# Patient Record
Sex: Female | Born: 1955 | Race: Black or African American | Hispanic: No | Marital: Single | Smoking: Former smoker
Health system: Southern US, Community
[De-identification: ages and names within clinical notes are randomized; demographics above are authoritative.]

## PROBLEM LIST (undated history)

## (undated) DIAGNOSIS — I499 Cardiac arrhythmia, unspecified: Secondary | ICD-10-CM

## (undated) DIAGNOSIS — D219 Benign neoplasm of connective and other soft tissue, unspecified: Secondary | ICD-10-CM

## (undated) DIAGNOSIS — Q8740 Marfan's syndrome, unspecified: Secondary | ICD-10-CM

## (undated) DIAGNOSIS — I509 Heart failure, unspecified: Secondary | ICD-10-CM

## (undated) DIAGNOSIS — M069 Rheumatoid arthritis, unspecified: Secondary | ICD-10-CM

## (undated) DIAGNOSIS — M1A9XX0 Chronic gout, unspecified, without tophus (tophi): Secondary | ICD-10-CM

## (undated) DIAGNOSIS — H548 Legal blindness, as defined in USA: Secondary | ICD-10-CM

## (undated) DIAGNOSIS — L210 Seborrhea capitis: Secondary | ICD-10-CM

## (undated) DIAGNOSIS — K565 Intestinal adhesions [bands], unspecified as to partial versus complete obstruction: Secondary | ICD-10-CM

## (undated) DIAGNOSIS — F1910 Other psychoactive substance abuse, uncomplicated: Secondary | ICD-10-CM

## (undated) DIAGNOSIS — I10 Essential (primary) hypertension: Secondary | ICD-10-CM

## (undated) DIAGNOSIS — Z974 Presence of external hearing-aid: Secondary | ICD-10-CM

## (undated) DIAGNOSIS — I4892 Unspecified atrial flutter: Principal | ICD-10-CM

## (undated) DIAGNOSIS — M810 Age-related osteoporosis without current pathological fracture: Secondary | ICD-10-CM

## (undated) DIAGNOSIS — K5720 Diverticulitis of large intestine with perforation and abscess without bleeding: Secondary | ICD-10-CM

## (undated) DIAGNOSIS — K469 Unspecified abdominal hernia without obstruction or gangrene: Secondary | ICD-10-CM

## (undated) DIAGNOSIS — Z86010 Personal history of colonic polyps: Secondary | ICD-10-CM

## (undated) DIAGNOSIS — Z9889 Other specified postprocedural states: Secondary | ICD-10-CM

## (undated) DIAGNOSIS — Z96659 Presence of unspecified artificial knee joint: Secondary | ICD-10-CM

## (undated) DIAGNOSIS — I639 Cerebral infarction, unspecified: Secondary | ICD-10-CM

## (undated) DIAGNOSIS — K56609 Unspecified intestinal obstruction, unspecified as to partial versus complete obstruction: Secondary | ICD-10-CM

## (undated) DIAGNOSIS — C801 Malignant (primary) neoplasm, unspecified: Secondary | ICD-10-CM

## (undated) HISTORY — DX: Intestinal adhesions (bands), unspecified as to partial versus complete obstruction: K56.50

## (undated) HISTORY — DX: Legal blindness, as defined in USA: H54.8

## (undated) HISTORY — DX: Seborrhea capitis: L21.0

## (undated) HISTORY — DX: Age-related osteoporosis without current pathological fracture: M81.0

## (undated) HISTORY — DX: Presence of external hearing-aid: Z97.4

## (undated) HISTORY — DX: Rheumatoid arthritis, unspecified: M06.9

## (undated) HISTORY — DX: Benign neoplasm of connective and other soft tissue, unspecified: D21.9

## (undated) HISTORY — DX: Diverticulitis of large intestine with perforation and abscess without bleeding: K57.20

## (undated) HISTORY — DX: Essential (primary) hypertension: I10

## (undated) HISTORY — DX: Marfan syndrome, unspecified: Q87.40

## (undated) HISTORY — DX: Other psychoactive substance abuse, uncomplicated: F19.10

## (undated) HISTORY — DX: Cerebral infarction, unspecified: I63.9

## (undated) HISTORY — PX: COLON SURGERY: SHX602

## (undated) HISTORY — DX: Chronic gout, unspecified, without tophus (tophi): M1A.9XX0

## (undated) HISTORY — DX: Unspecified abdominal hernia without obstruction or gangrene: K46.9

## (undated) HISTORY — DX: Unspecified intestinal obstruction, unspecified as to partial versus complete obstruction: K56.609

## (undated) HISTORY — DX: Other specified postprocedural states: Z98.890

## (undated) HISTORY — DX: Personal history of colonic polyps: Z86.010

## (undated) HISTORY — DX: Presence of unspecified artificial knee joint: Z96.659

## (undated) HISTORY — PX: HERNIA REPAIR: SHX51

---
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
## 1998-02-02 ENCOUNTER — Other Ambulatory Visit: Admission: RE | Admit: 1998-02-02 | Discharge: 1998-02-02 | Payer: Self-pay | Admitting: Family Medicine

## 1998-02-09 ENCOUNTER — Ambulatory Visit (HOSPITAL_COMMUNITY): Admission: RE | Admit: 1998-02-09 | Discharge: 1998-02-09 | Payer: Self-pay | Admitting: Family Medicine

## 1999-03-31 ENCOUNTER — Other Ambulatory Visit: Admission: RE | Admit: 1999-03-31 | Discharge: 1999-03-31 | Payer: Self-pay | Admitting: Family Medicine

## 1999-04-17 ENCOUNTER — Ambulatory Visit (HOSPITAL_COMMUNITY): Admission: RE | Admit: 1999-04-17 | Discharge: 1999-04-17 | Payer: Self-pay | Admitting: Family Medicine

## 2000-12-04 ENCOUNTER — Encounter: Payer: Self-pay | Admitting: Urology

## 2000-12-04 ENCOUNTER — Ambulatory Visit (HOSPITAL_COMMUNITY): Admission: RE | Admit: 2000-12-04 | Discharge: 2000-12-04 | Payer: Self-pay | Admitting: Urology

## 2001-04-25 ENCOUNTER — Other Ambulatory Visit: Admission: RE | Admit: 2001-04-25 | Discharge: 2001-04-25 | Payer: Self-pay | Admitting: Family Medicine

## 2002-06-23 ENCOUNTER — Encounter: Admission: RE | Admit: 2002-06-23 | Discharge: 2002-06-23 | Payer: Self-pay | Admitting: Family Medicine

## 2002-06-23 ENCOUNTER — Encounter: Payer: Self-pay | Admitting: Family Medicine

## 2002-08-14 ENCOUNTER — Emergency Department (HOSPITAL_COMMUNITY): Admission: EM | Admit: 2002-08-14 | Discharge: 2002-08-14 | Payer: Self-pay | Admitting: Emergency Medicine

## 2002-08-19 ENCOUNTER — Emergency Department (HOSPITAL_COMMUNITY): Admission: EM | Admit: 2002-08-19 | Discharge: 2002-08-19 | Payer: Self-pay | Admitting: Emergency Medicine

## 2002-08-19 ENCOUNTER — Encounter: Payer: Self-pay | Admitting: Emergency Medicine

## 2002-09-15 ENCOUNTER — Encounter: Admission: RE | Admit: 2002-09-15 | Discharge: 2002-12-14 | Payer: Self-pay | Admitting: Orthopedic Surgery

## 2002-10-09 ENCOUNTER — Emergency Department (HOSPITAL_COMMUNITY): Admission: EM | Admit: 2002-10-09 | Discharge: 2002-10-09 | Payer: Self-pay | Admitting: Emergency Medicine

## 2002-10-09 ENCOUNTER — Encounter: Payer: Self-pay | Admitting: Emergency Medicine

## 2002-10-11 ENCOUNTER — Emergency Department (HOSPITAL_COMMUNITY): Admission: EM | Admit: 2002-10-11 | Discharge: 2002-10-11 | Payer: Self-pay

## 2002-10-14 ENCOUNTER — Inpatient Hospital Stay (HOSPITAL_COMMUNITY): Admission: EM | Admit: 2002-10-14 | Discharge: 2002-10-15 | Payer: Self-pay | Admitting: Emergency Medicine

## 2002-10-15 ENCOUNTER — Encounter: Payer: Self-pay | Admitting: Internal Medicine

## 2002-10-27 ENCOUNTER — Other Ambulatory Visit: Admission: RE | Admit: 2002-10-27 | Discharge: 2002-10-27 | Payer: Self-pay | Admitting: Obstetrics and Gynecology

## 2002-11-11 ENCOUNTER — Ambulatory Visit (HOSPITAL_COMMUNITY): Admission: RE | Admit: 2002-11-11 | Discharge: 2002-11-11 | Payer: Self-pay | Admitting: Obstetrics and Gynecology

## 2002-11-11 ENCOUNTER — Encounter: Payer: Self-pay | Admitting: Obstetrics and Gynecology

## 2003-01-19 ENCOUNTER — Encounter: Payer: Self-pay | Admitting: Internal Medicine

## 2003-01-22 ENCOUNTER — Emergency Department (HOSPITAL_COMMUNITY): Admission: EM | Admit: 2003-01-22 | Discharge: 2003-01-22 | Payer: Self-pay | Admitting: *Deleted

## 2003-01-22 ENCOUNTER — Encounter: Payer: Self-pay | Admitting: *Deleted

## 2003-04-16 ENCOUNTER — Encounter: Payer: Self-pay | Admitting: Internal Medicine

## 2003-04-26 ENCOUNTER — Encounter: Admission: RE | Admit: 2003-04-26 | Discharge: 2003-04-26 | Payer: Self-pay | Admitting: Internal Medicine

## 2003-04-26 ENCOUNTER — Encounter: Payer: Self-pay | Admitting: Internal Medicine

## 2004-04-18 ENCOUNTER — Other Ambulatory Visit: Admission: RE | Admit: 2004-04-18 | Discharge: 2004-04-18 | Payer: Self-pay | Admitting: *Deleted

## 2004-09-14 ENCOUNTER — Ambulatory Visit: Payer: Self-pay | Admitting: Family Medicine

## 2004-10-24 ENCOUNTER — Ambulatory Visit: Payer: Self-pay | Admitting: Family Medicine

## 2004-12-25 ENCOUNTER — Ambulatory Visit: Payer: Self-pay | Admitting: Family Medicine

## 2004-12-28 ENCOUNTER — Emergency Department (HOSPITAL_COMMUNITY): Admission: EM | Admit: 2004-12-28 | Discharge: 2004-12-28 | Payer: Self-pay | Admitting: Emergency Medicine

## 2005-01-01 ENCOUNTER — Ambulatory Visit: Payer: Self-pay | Admitting: Internal Medicine

## 2005-01-03 ENCOUNTER — Ambulatory Visit: Payer: Self-pay | Admitting: Family Medicine

## 2005-01-06 ENCOUNTER — Emergency Department (HOSPITAL_COMMUNITY): Admission: EM | Admit: 2005-01-06 | Discharge: 2005-01-06 | Payer: Self-pay | Admitting: Emergency Medicine

## 2005-01-24 ENCOUNTER — Emergency Department (HOSPITAL_COMMUNITY): Admission: EM | Admit: 2005-01-24 | Discharge: 2005-01-25 | Payer: Self-pay | Admitting: Emergency Medicine

## 2005-02-06 ENCOUNTER — Ambulatory Visit: Payer: Self-pay | Admitting: Internal Medicine

## 2005-03-12 ENCOUNTER — Emergency Department (HOSPITAL_COMMUNITY): Admission: EM | Admit: 2005-03-12 | Discharge: 2005-03-12 | Payer: Self-pay | Admitting: Emergency Medicine

## 2005-03-16 ENCOUNTER — Ambulatory Visit: Payer: Self-pay | Admitting: Internal Medicine

## 2005-05-15 ENCOUNTER — Ambulatory Visit: Payer: Self-pay | Admitting: Family Medicine

## 2005-10-30 ENCOUNTER — Ambulatory Visit: Payer: Self-pay | Admitting: Family Medicine

## 2005-11-07 ENCOUNTER — Ambulatory Visit: Payer: Self-pay | Admitting: Family Medicine

## 2005-11-07 ENCOUNTER — Emergency Department (HOSPITAL_COMMUNITY): Admission: RE | Admit: 2005-11-07 | Discharge: 2005-11-08 | Payer: Self-pay | Admitting: Family Medicine

## 2005-11-12 ENCOUNTER — Ambulatory Visit: Payer: Self-pay | Admitting: Family Medicine

## 2005-12-05 ENCOUNTER — Ambulatory Visit: Payer: Self-pay | Admitting: Internal Medicine

## 2005-12-30 ENCOUNTER — Emergency Department (HOSPITAL_COMMUNITY): Admission: EM | Admit: 2005-12-30 | Discharge: 2005-12-30 | Payer: Self-pay | Admitting: Emergency Medicine

## 2006-03-19 ENCOUNTER — Ambulatory Visit: Payer: Self-pay | Admitting: Family Medicine

## 2006-06-25 DIAGNOSIS — M1A9XX0 Chronic gout, unspecified, without tophus (tophi): Secondary | ICD-10-CM

## 2006-06-25 HISTORY — DX: Chronic gout, unspecified, without tophus (tophi): M1A.9XX0

## 2006-07-15 ENCOUNTER — Emergency Department (HOSPITAL_COMMUNITY): Admission: EM | Admit: 2006-07-15 | Discharge: 2006-07-15 | Payer: Self-pay | Admitting: Emergency Medicine

## 2006-07-17 ENCOUNTER — Ambulatory Visit: Payer: Self-pay | Admitting: Family Medicine

## 2006-07-26 DIAGNOSIS — Z8673 Personal history of transient ischemic attack (TIA), and cerebral infarction without residual deficits: Secondary | ICD-10-CM | POA: Insufficient documentation

## 2006-07-26 DIAGNOSIS — I639 Cerebral infarction, unspecified: Secondary | ICD-10-CM

## 2006-07-26 HISTORY — DX: Cerebral infarction, unspecified: I63.9

## 2006-07-27 ENCOUNTER — Encounter: Payer: Self-pay | Admitting: Emergency Medicine

## 2006-07-28 ENCOUNTER — Ambulatory Visit: Payer: Self-pay | Admitting: Neurology

## 2006-07-28 ENCOUNTER — Ambulatory Visit: Payer: Self-pay | Admitting: Internal Medicine

## 2006-07-28 ENCOUNTER — Inpatient Hospital Stay (HOSPITAL_COMMUNITY): Admission: AD | Admit: 2006-07-28 | Discharge: 2006-07-31 | Payer: Self-pay | Admitting: Internal Medicine

## 2006-07-29 ENCOUNTER — Encounter (INDEPENDENT_AMBULATORY_CARE_PROVIDER_SITE_OTHER): Payer: Self-pay | Admitting: Neurology

## 2006-07-29 ENCOUNTER — Encounter: Payer: Self-pay | Admitting: Cardiology

## 2006-08-06 ENCOUNTER — Ambulatory Visit: Payer: Self-pay | Admitting: Family Medicine

## 2006-08-08 ENCOUNTER — Ambulatory Visit: Payer: Self-pay | Admitting: Internal Medicine

## 2006-08-19 ENCOUNTER — Ambulatory Visit: Payer: Self-pay | Admitting: Internal Medicine

## 2006-08-20 ENCOUNTER — Ambulatory Visit: Payer: Self-pay | Admitting: Family Medicine

## 2006-08-29 ENCOUNTER — Emergency Department (HOSPITAL_COMMUNITY): Admission: EM | Admit: 2006-08-29 | Discharge: 2006-08-29 | Payer: Self-pay | Admitting: Emergency Medicine

## 2006-09-02 ENCOUNTER — Ambulatory Visit: Payer: Self-pay | Admitting: Family Medicine

## 2006-09-05 ENCOUNTER — Ambulatory Visit: Payer: Self-pay | Admitting: Internal Medicine

## 2006-09-24 DIAGNOSIS — K469 Unspecified abdominal hernia without obstruction or gangrene: Secondary | ICD-10-CM

## 2006-09-24 HISTORY — DX: Unspecified abdominal hernia without obstruction or gangrene: K46.9

## 2006-10-17 ENCOUNTER — Encounter (INDEPENDENT_AMBULATORY_CARE_PROVIDER_SITE_OTHER): Payer: Self-pay | Admitting: Specialist

## 2006-10-17 ENCOUNTER — Ambulatory Visit (HOSPITAL_COMMUNITY): Admission: RE | Admit: 2006-10-17 | Discharge: 2006-10-18 | Payer: Self-pay | Admitting: Surgery

## 2006-11-24 DIAGNOSIS — Z96659 Presence of unspecified artificial knee joint: Secondary | ICD-10-CM

## 2006-11-24 HISTORY — PX: TOTAL KNEE ARTHROPLASTY: SHX125

## 2006-11-24 HISTORY — DX: Presence of unspecified artificial knee joint: Z96.659

## 2006-11-25 ENCOUNTER — Ambulatory Visit: Payer: Self-pay | Admitting: Family Medicine

## 2006-12-04 ENCOUNTER — Inpatient Hospital Stay (HOSPITAL_COMMUNITY): Admission: RE | Admit: 2006-12-04 | Discharge: 2006-12-10 | Payer: Self-pay | Admitting: Orthopedic Surgery

## 2007-01-07 DIAGNOSIS — I10 Essential (primary) hypertension: Secondary | ICD-10-CM | POA: Insufficient documentation

## 2007-01-07 DIAGNOSIS — M1990 Unspecified osteoarthritis, unspecified site: Secondary | ICD-10-CM | POA: Insufficient documentation

## 2007-01-07 DIAGNOSIS — K219 Gastro-esophageal reflux disease without esophagitis: Secondary | ICD-10-CM | POA: Insufficient documentation

## 2007-01-07 DIAGNOSIS — E785 Hyperlipidemia, unspecified: Secondary | ICD-10-CM | POA: Insufficient documentation

## 2007-01-07 DIAGNOSIS — Q8740 Marfan's syndrome, unspecified: Secondary | ICD-10-CM | POA: Insufficient documentation

## 2007-01-14 ENCOUNTER — Ambulatory Visit: Payer: Self-pay | Admitting: Family Medicine

## 2007-01-14 ENCOUNTER — Telehealth: Payer: Self-pay | Admitting: *Deleted

## 2007-01-14 DIAGNOSIS — M109 Gout, unspecified: Secondary | ICD-10-CM | POA: Insufficient documentation

## 2007-03-31 ENCOUNTER — Inpatient Hospital Stay (HOSPITAL_COMMUNITY): Admission: RE | Admit: 2007-03-31 | Discharge: 2007-04-07 | Payer: Self-pay | Admitting: Orthopedic Surgery

## 2007-04-03 ENCOUNTER — Encounter: Payer: Self-pay | Admitting: *Deleted

## 2007-04-07 ENCOUNTER — Ambulatory Visit: Payer: Self-pay | Admitting: *Deleted

## 2007-04-12 DIAGNOSIS — H547 Unspecified visual loss: Secondary | ICD-10-CM | POA: Insufficient documentation

## 2007-04-12 DIAGNOSIS — K648 Other hemorrhoids: Secondary | ICD-10-CM | POA: Insufficient documentation

## 2007-04-12 DIAGNOSIS — F1011 Alcohol abuse, in remission: Secondary | ICD-10-CM | POA: Insufficient documentation

## 2007-04-12 DIAGNOSIS — H548 Legal blindness, as defined in USA: Secondary | ICD-10-CM | POA: Insufficient documentation

## 2007-05-07 ENCOUNTER — Ambulatory Visit: Payer: Self-pay | Admitting: Family Medicine

## 2007-05-07 DIAGNOSIS — M810 Age-related osteoporosis without current pathological fracture: Secondary | ICD-10-CM | POA: Insufficient documentation

## 2007-05-08 ENCOUNTER — Telehealth: Payer: Self-pay | Admitting: Family Medicine

## 2007-05-09 LAB — CONVERTED CEMR LAB
Basophils Absolute: 0 10*3/uL (ref 0.0–0.1)
Basophils Relative: 0.3 % (ref 0.0–1.0)
Eosinophils Absolute: 0 10*3/uL (ref 0.0–0.6)
Eosinophils Relative: 0.2 % (ref 0.0–5.0)
HCT: 38.8 % (ref 36.0–46.0)
Hemoglobin: 13.3 g/dL (ref 12.0–15.0)
Lymphocytes Relative: 15.8 % (ref 12.0–46.0)
MCHC: 34.3 g/dL (ref 30.0–36.0)
MCV: 89.5 fL (ref 78.0–100.0)
Monocytes Absolute: 0.4 10*3/uL (ref 0.2–0.7)
Monocytes Relative: 4.2 % (ref 3.0–11.0)
Neutro Abs: 8.6 10*3/uL — ABNORMAL HIGH (ref 1.4–7.7)
Neutrophils Relative %: 79.5 % — ABNORMAL HIGH (ref 43.0–77.0)
Platelets: 225 10*3/uL (ref 150–400)
RBC: 4.34 M/uL (ref 3.87–5.11)
RDW: 15 % — ABNORMAL HIGH (ref 11.5–14.6)
Vit D, 1,25-Dihydroxy: 19 — ABNORMAL LOW (ref 30–89)
WBC: 10.7 10*3/uL — ABNORMAL HIGH (ref 4.5–10.5)

## 2007-05-19 ENCOUNTER — Encounter: Payer: Self-pay | Admitting: Family Medicine

## 2007-05-19 ENCOUNTER — Ambulatory Visit: Payer: Self-pay | Admitting: Internal Medicine

## 2007-06-09 ENCOUNTER — Telehealth: Payer: Self-pay | Admitting: Family Medicine

## 2007-06-17 ENCOUNTER — Encounter: Payer: Self-pay | Admitting: Family Medicine

## 2007-06-17 ENCOUNTER — Emergency Department (HOSPITAL_COMMUNITY): Admission: EM | Admit: 2007-06-17 | Discharge: 2007-06-18 | Payer: Self-pay | Admitting: Emergency Medicine

## 2007-07-01 ENCOUNTER — Ambulatory Visit: Payer: Self-pay | Admitting: Family Medicine

## 2007-07-01 DIAGNOSIS — E041 Nontoxic single thyroid nodule: Secondary | ICD-10-CM | POA: Insufficient documentation

## 2007-07-01 DIAGNOSIS — J984 Other disorders of lung: Secondary | ICD-10-CM | POA: Insufficient documentation

## 2007-07-07 ENCOUNTER — Encounter: Admission: RE | Admit: 2007-07-07 | Discharge: 2007-07-07 | Payer: Self-pay | Admitting: Family Medicine

## 2007-08-06 ENCOUNTER — Encounter: Payer: Self-pay | Admitting: Family Medicine

## 2007-11-10 ENCOUNTER — Ambulatory Visit: Payer: Self-pay | Admitting: Family Medicine

## 2007-12-08 ENCOUNTER — Other Ambulatory Visit: Admission: RE | Admit: 2007-12-08 | Discharge: 2007-12-08 | Payer: Self-pay | Admitting: Obstetrics & Gynecology

## 2007-12-17 ENCOUNTER — Telehealth: Payer: Self-pay | Admitting: Internal Medicine

## 2007-12-18 ENCOUNTER — Telehealth: Payer: Self-pay | Admitting: Family Medicine

## 2007-12-30 ENCOUNTER — Telehealth: Payer: Self-pay | Admitting: Family Medicine

## 2007-12-31 ENCOUNTER — Telehealth: Payer: Self-pay | Admitting: Family Medicine

## 2008-01-01 ENCOUNTER — Telehealth: Payer: Self-pay | Admitting: Family Medicine

## 2008-01-08 ENCOUNTER — Ambulatory Visit: Payer: Self-pay | Admitting: Internal Medicine

## 2008-02-17 ENCOUNTER — Telehealth: Payer: Self-pay | Admitting: Family Medicine

## 2008-03-17 ENCOUNTER — Telehealth: Payer: Self-pay | Admitting: Family Medicine

## 2008-03-17 ENCOUNTER — Emergency Department (HOSPITAL_COMMUNITY): Admission: EM | Admit: 2008-03-17 | Discharge: 2008-03-17 | Payer: Self-pay | Admitting: Emergency Medicine

## 2008-03-22 ENCOUNTER — Ambulatory Visit: Payer: Self-pay | Admitting: Family Medicine

## 2008-03-22 DIAGNOSIS — T7840XA Allergy, unspecified, initial encounter: Secondary | ICD-10-CM | POA: Insufficient documentation

## 2008-03-25 ENCOUNTER — Telehealth: Payer: Self-pay | Admitting: Family Medicine

## 2008-04-01 ENCOUNTER — Encounter: Payer: Self-pay | Admitting: Family Medicine

## 2008-04-02 ENCOUNTER — Telehealth: Payer: Self-pay | Admitting: Internal Medicine

## 2008-04-02 ENCOUNTER — Telehealth: Payer: Self-pay | Admitting: Family Medicine

## 2008-05-14 ENCOUNTER — Telehealth: Payer: Self-pay | Admitting: Family Medicine

## 2008-06-03 ENCOUNTER — Telehealth: Payer: Self-pay | Admitting: Family Medicine

## 2008-06-08 ENCOUNTER — Telehealth: Payer: Self-pay | Admitting: Family Medicine

## 2008-06-21 ENCOUNTER — Encounter: Payer: Self-pay | Admitting: Family Medicine

## 2008-06-25 DIAGNOSIS — Z9889 Other specified postprocedural states: Secondary | ICD-10-CM

## 2008-06-25 HISTORY — DX: Other specified postprocedural states: Z98.890

## 2008-06-25 HISTORY — PX: BUNIONECTOMY: SHX129

## 2008-07-01 ENCOUNTER — Telehealth: Payer: Self-pay | Admitting: Family Medicine

## 2008-07-06 ENCOUNTER — Telehealth: Payer: Self-pay | Admitting: Family Medicine

## 2008-07-08 ENCOUNTER — Encounter: Payer: Self-pay | Admitting: Family Medicine

## 2008-07-14 ENCOUNTER — Ambulatory Visit: Payer: Self-pay | Admitting: Surgery

## 2008-07-22 ENCOUNTER — Ambulatory Visit: Payer: Self-pay | Admitting: Family Medicine

## 2008-07-22 DIAGNOSIS — L03119 Cellulitis of unspecified part of limb: Secondary | ICD-10-CM

## 2008-07-22 DIAGNOSIS — L02419 Cutaneous abscess of limb, unspecified: Secondary | ICD-10-CM | POA: Insufficient documentation

## 2008-08-05 ENCOUNTER — Ambulatory Visit: Payer: Self-pay | Admitting: Internal Medicine

## 2008-08-05 ENCOUNTER — Encounter: Payer: Self-pay | Admitting: Family Medicine

## 2008-08-18 ENCOUNTER — Encounter: Payer: Self-pay | Admitting: Family Medicine

## 2008-08-23 ENCOUNTER — Encounter: Payer: Self-pay | Admitting: Family Medicine

## 2008-08-24 ENCOUNTER — Ambulatory Visit: Payer: Self-pay | Admitting: Family Medicine

## 2008-08-26 LAB — CONVERTED CEMR LAB
BUN: 13 mg/dL (ref 6–23)
Basophils Absolute: 0 10*3/uL (ref 0.0–0.1)
Basophils Relative: 0.1 % (ref 0.0–3.0)
CO2: 30 meq/L (ref 19–32)
Calcium: 9.5 mg/dL (ref 8.4–10.5)
Chloride: 100 meq/L (ref 96–112)
Creatinine, Ser: 0.7 mg/dL (ref 0.4–1.2)
Eosinophils Absolute: 0 10*3/uL (ref 0.0–0.7)
Eosinophils Relative: 0.2 % (ref 0.0–5.0)
GFR calc Af Amer: 113 mL/min
GFR calc non Af Amer: 93 mL/min
Glucose, Bld: 78 mg/dL (ref 70–99)
HCT: 41.5 % (ref 36.0–46.0)
Hemoglobin: 14.3 g/dL (ref 12.0–15.0)
Lymphocytes Relative: 11.5 % — ABNORMAL LOW (ref 12.0–46.0)
MCHC: 34.4 g/dL (ref 30.0–36.0)
MCV: 91.7 fL (ref 78.0–100.0)
Monocytes Absolute: 0.4 10*3/uL (ref 0.1–1.0)
Monocytes Relative: 2.6 % — ABNORMAL LOW (ref 3.0–12.0)
Neutro Abs: 11.7 10*3/uL — ABNORMAL HIGH (ref 1.4–7.7)
Neutrophils Relative %: 85.6 % — ABNORMAL HIGH (ref 43.0–77.0)
Platelets: 242 10*3/uL (ref 150–400)
Potassium: 4.1 meq/L (ref 3.5–5.1)
RBC: 4.52 M/uL (ref 3.87–5.11)
RDW: 13.9 % (ref 11.5–14.6)
Sodium: 140 meq/L (ref 135–145)
WBC: 13.7 10*3/uL — ABNORMAL HIGH (ref 4.5–10.5)

## 2008-09-03 ENCOUNTER — Telehealth: Payer: Self-pay | Admitting: *Deleted

## 2008-09-09 ENCOUNTER — Encounter: Admission: RE | Admit: 2008-09-09 | Discharge: 2008-09-09 | Payer: Self-pay | Admitting: Family Medicine

## 2008-09-13 ENCOUNTER — Ambulatory Visit: Payer: Self-pay | Admitting: Family Medicine

## 2008-09-15 ENCOUNTER — Telehealth: Payer: Self-pay | Admitting: Family Medicine

## 2008-12-14 ENCOUNTER — Telehealth: Payer: Self-pay | Admitting: Family Medicine

## 2009-01-10 ENCOUNTER — Ambulatory Visit: Payer: Self-pay | Admitting: Family Medicine

## 2009-01-10 DIAGNOSIS — H60399 Other infective otitis externa, unspecified ear: Secondary | ICD-10-CM | POA: Insufficient documentation

## 2009-04-26 ENCOUNTER — Telehealth: Payer: Self-pay | Admitting: Internal Medicine

## 2009-04-28 ENCOUNTER — Telehealth: Payer: Self-pay | Admitting: Family Medicine

## 2009-05-30 ENCOUNTER — Telehealth: Payer: Self-pay | Admitting: Family Medicine

## 2009-05-30 ENCOUNTER — Ambulatory Visit: Payer: Self-pay | Admitting: Internal Medicine

## 2009-06-07 ENCOUNTER — Ambulatory Visit: Payer: Self-pay | Admitting: Family Medicine

## 2009-06-07 DIAGNOSIS — M069 Rheumatoid arthritis, unspecified: Secondary | ICD-10-CM | POA: Insufficient documentation

## 2009-06-08 ENCOUNTER — Telehealth: Payer: Self-pay | Admitting: Family Medicine

## 2009-06-30 ENCOUNTER — Telehealth: Payer: Self-pay | Admitting: Family Medicine

## 2009-07-11 ENCOUNTER — Encounter: Payer: Self-pay | Admitting: Family Medicine

## 2009-07-11 LAB — HM MAMMOGRAPHY: HM Mammogram: NEGATIVE

## 2009-07-18 ENCOUNTER — Telehealth: Payer: Self-pay | Admitting: Family Medicine

## 2009-09-12 ENCOUNTER — Encounter: Payer: Self-pay | Admitting: Family Medicine

## 2009-09-26 ENCOUNTER — Ambulatory Visit: Payer: Self-pay | Admitting: Family Medicine

## 2009-09-26 DIAGNOSIS — R609 Edema, unspecified: Secondary | ICD-10-CM | POA: Insufficient documentation

## 2009-09-28 LAB — CONVERTED CEMR LAB
ALT: 33 units/L (ref 0–35)
AST: 37 units/L (ref 0–37)
Albumin: 3.3 g/dL — ABNORMAL LOW (ref 3.5–5.2)
Alkaline Phosphatase: 80 units/L (ref 39–117)
BUN: 6 mg/dL (ref 6–23)
Basophils Absolute: 0 10*3/uL (ref 0.0–0.1)
Basophils Relative: 0.3 % (ref 0.0–3.0)
Bilirubin, Direct: 0.2 mg/dL (ref 0.0–0.3)
CO2: 27 meq/L (ref 19–32)
Calcium: 9.2 mg/dL (ref 8.4–10.5)
Chloride: 102 meq/L (ref 96–112)
Creatinine, Ser: 0.9 mg/dL (ref 0.4–1.2)
Eosinophils Absolute: 0.1 10*3/uL (ref 0.0–0.7)
Eosinophils Relative: 0.9 % (ref 0.0–5.0)
GFR calc non Af Amer: 84.06 mL/min (ref >60)
Glucose, Bld: 86 mg/dL (ref 70–99)
HCT: 37.2 % (ref 36.0–46.0)
Hemoglobin: 12.6 g/dL (ref 12.0–15.0)
Hgb A1c MFr Bld: 5.6 % (ref 4.6–6.5)
Lymphocytes Relative: 22.4 % (ref 12.0–46.0)
Lymphs Abs: 2.1 10*3/uL (ref 0.7–4.0)
MCHC: 33.9 g/dL (ref 30.0–36.0)
MCV: 90.3 fL (ref 78.0–100.0)
Monocytes Absolute: 0.5 10*3/uL (ref 0.1–1.0)
Monocytes Relative: 5 % (ref 3.0–12.0)
Neutro Abs: 6.7 10*3/uL (ref 1.4–7.7)
Neutrophils Relative %: 71.4 % (ref 43.0–77.0)
Platelets: 262 10*3/uL (ref 150.0–400.0)
Potassium: 4 meq/L (ref 3.5–5.1)
RBC: 4.12 M/uL (ref 3.87–5.11)
RDW: 15.5 % — ABNORMAL HIGH (ref 11.5–14.6)
Sodium: 140 meq/L (ref 135–145)
TSH: 1.15 microintl units/mL (ref 0.35–5.50)
Total Bilirubin: 0.6 mg/dL (ref 0.3–1.2)
Total Protein: 7.1 g/dL (ref 6.0–8.3)
WBC: 9.4 10*3/uL (ref 4.5–10.5)

## 2009-10-12 ENCOUNTER — Telehealth (INDEPENDENT_AMBULATORY_CARE_PROVIDER_SITE_OTHER): Payer: Self-pay | Admitting: *Deleted

## 2009-10-31 ENCOUNTER — Emergency Department (HOSPITAL_COMMUNITY): Admission: EM | Admit: 2009-10-31 | Discharge: 2009-10-31 | Payer: Self-pay | Admitting: Emergency Medicine

## 2009-11-03 ENCOUNTER — Emergency Department (HOSPITAL_COMMUNITY): Admission: EM | Admit: 2009-11-03 | Discharge: 2009-11-03 | Payer: Self-pay | Admitting: Emergency Medicine

## 2009-11-07 ENCOUNTER — Telehealth: Payer: Self-pay | Admitting: Family Medicine

## 2009-11-15 ENCOUNTER — Ambulatory Visit: Payer: Self-pay | Admitting: Family Medicine

## 2009-11-15 DIAGNOSIS — T148XXA Other injury of unspecified body region, initial encounter: Secondary | ICD-10-CM | POA: Insufficient documentation

## 2009-12-02 ENCOUNTER — Telehealth: Payer: Self-pay | Admitting: *Deleted

## 2009-12-05 ENCOUNTER — Telehealth: Payer: Self-pay | Admitting: Family Medicine

## 2010-02-01 ENCOUNTER — Telehealth: Payer: Self-pay | Admitting: Family Medicine

## 2010-04-03 ENCOUNTER — Ambulatory Visit: Payer: Self-pay | Admitting: Family Medicine

## 2010-04-17 ENCOUNTER — Telehealth: Payer: Self-pay | Admitting: Family Medicine

## 2010-05-05 ENCOUNTER — Telehealth: Payer: Self-pay | Admitting: Family Medicine

## 2010-06-02 ENCOUNTER — Telehealth: Payer: Self-pay | Admitting: Family Medicine

## 2010-06-02 ENCOUNTER — Telehealth: Payer: Self-pay | Admitting: *Deleted

## 2010-06-12 ENCOUNTER — Telehealth: Payer: Self-pay | Admitting: Family Medicine

## 2010-07-04 ENCOUNTER — Telehealth: Payer: Self-pay | Admitting: Family Medicine

## 2010-07-12 ENCOUNTER — Encounter: Payer: Self-pay | Admitting: Family Medicine

## 2010-07-16 ENCOUNTER — Encounter: Payer: Self-pay | Admitting: Family Medicine

## 2010-07-17 ENCOUNTER — Encounter: Payer: Self-pay | Admitting: Family Medicine

## 2010-07-25 NOTE — Assessment & Plan Note (Signed)
Summary: follow up on wound/cjr   Vital Signs:  Patient profile:   55 year old female Weight:      159 pounds BMI:     23.57 Temp:     99.0 degrees F oral BP sitting:   160 / 100  (left arm) Cuff size:   regular  Vitals Entered By: Raechel Ache, RN (Nov 15, 2009 2:15 PM) CC: Follow-up R leg wound; was in ER 2 weeks ago.   History of Present Illness: Here to recheck a wound on her right lower leg. She discovered this on 10-31-09 and went to the ER. It was lanced open and drained, then packed. On 11-03-09 she went back to the ER to have the packing removed. She is dressing it daily with Neosporin and gauze compresses. It is a little sore but otherwise does not bother her.   Allergies: No Known Drug Allergies  Past History:  Past Medical History: GOUT HYPERTENSION  HYPERLIPIDEMIA MARFAN'S SYNDROME  GERD  Blindness Internal Hemorrhoids Hx of Alcholism( in remission) Hepatic cyst  Diverticulosis Chronic UTI, sees Dr. Patsi Sears IBS-C Allergic reactions/? angioedema Rheumatoid arthritis  Past Surgical History: Reviewed history from 07/22/2008 and no changes required. Cervical spine sx-1986 per Jeral Fruit Let eye prosthesis Total knee replacement bilateral Foot Surgery, bilateral- multiple operations, sees Dr. Raynald Kemp at [facility] Neck Surgery, c-spine fracture Hernia Surgery right femoral 2008  Review of Systems  The patient denies anorexia, fever, weight loss, weight gain, vision loss, decreased hearing, hoarseness, chest pain, syncope, dyspnea on exertion, peripheral edema, prolonged cough, headaches, hemoptysis, abdominal pain, melena, hematochezia, severe indigestion/heartburn, hematuria, incontinence, genital sores, muscle weakness, suspicious skin lesions, transient blindness, difficulty walking, depression, unusual weight change, abnormal bleeding, enlarged lymph nodes, angioedema, breast masses, and testicular masses.    Physical Exam  General:   Well-developed,well-nourished,in no acute distress; alert,appropriate and cooperative throughout examination Extremities:  the right lower leg has a small tender nodular area with a clean incision over it. is draining some serosanguinous fluid. No purulence seen. Not warm or red.    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 Take 1 capsule by mouth twice a day 2)  Nexium 40 Mg Cpdr (Esomeprazole magnesium) .Marland Kitchen.. 1 once daily 3)  Allegra 180 Mg Tabs (Fexofenadine hcl) .... Once daily 4)  Colchicine 0.6 Mg Tabs (Colchicine) .Marland Kitchen.. 1 by mouth two times a day 5)  Stool Softner  .... As needed 6)  Diprolene Af 0.05 % Crea (Aug betamethasone dipropionate) .... As needed 7)  Tramadol Hcl 50 Mg Tabs (Tramadol hcl) .Marland Kitchen.. 1-2 by mouth every 6 hours as needed for pain 8)  Prednisone 10 Mg Tabs (Prednisone) .... 2 by mouth once daily 9)  Skelaxin 800 Mg Tabs (Metaxalone) .... Two times a day 10)  Vitamin D 91478 Unit Caps (Ergocalciferol) .Marland Kitchen.. 1 by mouth once daily 11)  Amlodipine Besylate 10 Mg Tabs (Amlodipine besylate) .... Once daily 12)  Vicodin 5-500 Mg Tabs (Hydrocodone-acetaminophen) .Marland Kitchen.. 1 every 6 hours as needed pain 13)  Epipen 2-pak 0.3 Mg/0.21ml (1:1000) Devi (Epinephrine hcl (anaphylaxis)) .... As needed 14)  Actonel 150 Mg Tabs (Risedronate sodium) .Marland Kitchen.. 1 tablet by mouth every month 15)  Allopurinol 100 Mg Tabs (Allopurinol) .Marland Kitchen.. 1 by mouth once daily 16)  Cephalexin 250 Mg Tabs (Cephalexin) .... Once daily  Patient Instructions: 1)  With gentle massage I was able to express some clotted blood from  this wound. Redressed with a Bandaid. She will continue to dress it daily.  2)  Please schedule a follow-up appointment as needed .

## 2010-07-25 NOTE — Progress Notes (Signed)
Summary: refill   Phone Note Call from Patient   Summary of Call: pt says she needs refill on ultram.  ins will not cover clarinex.needs substitute Initial call taken by: Sindy Guadeloupe RN,  July 01, 2008 12:09 PM  Follow-up for Phone Call        Recieved a fax from pharmacy  saying that they will cover flonase, nasonex or fexofindidne. Pt uses [facility] Follow-up by: Romualdo Bolk, CMA,  July 02, 2008 9:59 AM  Additional Follow-up for Phone Call Additional follow up Details #1::        call in tramadol 50 mg, take 1 or 2 every 6 hours as needed pain, #60 with 5 rf, also Fexofenidine 180 mg once daily , #30 with 11 rf Additional Follow-up by: Nelwyn Salisbury MD,  July 02, 2008 10:19 AM    Additional Follow-up for Phone Call Additional follow up Details #2::    Rx faxed to pharmacy. Pt aware of this. Follow-up by: Romualdo Bolk, CMA,  July 02, 2008 11:53 AM  New/Updated Medications: ULTRAM ER 100 MG  TB24 (TRAMADOL HCL) 1-2  every 6 hours as needed FEXOFENADINE HCL 180 MG TABS (FEXOFENADINE HCL) 1 by mouth once daily   Prescriptions: FEXOFENADINE HCL 180 MG TABS (FEXOFENADINE HCL) 1 by mouth once daily  #30 x 11   Entered by:   Romualdo Bolk, CMA   Authorized by:   Nelwyn Salisbury MD   Signed by:   Romualdo Bolk, CMA on 07/02/2008   Method used:   Faxed to ...       [facility] (retail)       [address]       [place], [place]  [address]       Ph: [phone]       Fax: [phone]   RxID:   [id number] ULTRAM ER 100 MG  TB24 (TRAMADOL HCL) 1-2  every 6 hours as needed  #60 x 5   Entered by:   Romualdo Bolk, CMA   Authorized by:   Nelwyn Salisbury MD   Signed by:   Romualdo Bolk, CMA on 07/02/2008   Method used:   Faxed to ...       [facility] (retail)       [address]       [place], [place]  [address]       Ph:  [phone]       Fax: [phone]   RxID:   [id number]

## 2010-07-25 NOTE — Progress Notes (Signed)
Summary: med for gout  Phone Note Call from Patient Call back at Home Phone [phone]   Caller: pt lvm triage Call For: fry  Summary of Call: she called back with meds that medicaid would pay for for her gout.  They pharmacy says Dr Clent Ridges has not called it in.  Is she supposed to get the med or not  Initial call taken by: Roselle Locus,  June 08, 2008 1:17 PM  Follow-up for Phone Call        call in Allopurinol 100 mg once daily for one year Follow-up by: Nelwyn Salisbury MD,  June 08, 2008 3:38 PM  Additional Follow-up for Phone Call Additional follow up Details #1::        Phone Call Completed, Rx Called In Additional Follow-up by: Alfred Levins, CMA,  June 08, 2008 5:04 PM    New/Updated Medications: ALLOPURINOL 100 MG TABS (ALLOPURINOL) 1 by mouth once daily   Prescriptions: ALLOPURINOL 100 MG TABS (ALLOPURINOL) 1 by mouth once daily  #30 x 11   Entered by:   Alfred Levins, CMA   Authorized by:   Nelwyn Salisbury MD   Signed by:   Alfred Levins, CMA on 06/08/2008   Method used:   Faxed to ...       [facility] (retail)       [address]       [place], [place]  [address]       Ph: [phone]       Fax: [phone]   RxID:   [id number]

## 2010-07-25 NOTE — Assessment & Plan Note (Signed)
Summary: FU ER ALLERGIC REACTION/PS   Vital Signs:  Patient Profile:   55 Years Old Female Height:     69 inches Weight:      160 pounds Temp:     98.5 degrees F oral BP sitting:   142 / 94  (left arm) Cuff size:   regular  Vitals Entered By: Alfred Levins, CMA (March 22, 2008 2:23 PM)                 PCP:  Shellia Carwin MD  Chief Complaint:  f/u from er and dizzy.  History of Present Illness: Has had several trips to the ER over the past few years for allergic reactions. She presumes it is to food, but we aren't sure. The most  recent was on 03-17-08 when she had SOB, lip and tongue swelling, and itching. She did not respond to oral Benadryl at home, so EMS was called. She responded to IV Benadryl and Solumedrol at the ER. Has felt fine since.     Current Allergies: No known allergies   Past Medical History:    Reviewed history from 01/08/2008 and no changes required:       GOUT       HYPERTENSION        HYPERLIPIDEMIA       MARFAN'S SYNDROME        OSTEOARTHRITIS        GERD        Blindness       Internal Hemorrhoids       Hx of Alcholism( in remission)       Hepatic cyst        Diverticulosis       Chronic UTI       IBS-C     Review of Systems  The patient denies anorexia, fever, weight loss, weight gain, vision loss, decreased hearing, hoarseness, chest pain, syncope, dyspnea on exertion, peripheral edema, prolonged cough, headaches, hemoptysis, abdominal pain, melena, hematochezia, severe indigestion/heartburn, hematuria, incontinence, genital sores, muscle weakness, suspicious skin lesions, transient blindness, difficulty walking, depression, unusual weight change, abnormal bleeding, enlarged lymph nodes, angioedema, breast masses, and testicular masses.     Physical Exam  General:     Well-developed,well-nourished,in no acute distress; alert,appropriate and cooperative throughout examination Mouth:     Oral mucosa and oropharynx without lesions or  exudates.  Teeth in good repair. Neck:     No deformities, masses, or tenderness noted. Lungs:     Normal respiratory effort, chest expands symmetrically. Lungs are clear to auscultation, no crackles or wheezes. Heart:     Normal rate and regular rhythm. S1 and S2 normal without gallop, murmur, click, rub or other extra sounds.    Impression & Recommendations:  Problem # 1:  ALLERGIC REACTION (ICD-995.3)  Orders: Allergy Referral  (Allergy)   Complete Medication List: 1)  Amitiza 24 Mcg Caps (Lubiprostone) .... Take 1 capsule by mouth twice a day 2)  Nexium 40 Mg Cpdr (Esomeprazole magnesium) .Marland Kitchen.. 1 once daily 3)  Clarinex 5 Mg Tabs (Desloratadine) .Marland Kitchen.. 1 once daily 4)  Cephalexin 250 Mg Caps (Cephalexin) .Marland Kitchen.. 1 once daily 5)  Aspir-low 81 Mg Tbec (Aspirin) .Marland Kitchen.. 1 once daily 6)  Colchicine 0.6 Mg Tabs (Colchicine) .Marland Kitchen.. 1 by mouth two times a day 7)  Stool Softner  .... As needed 8)  Diprolene Af 0.05 % Crea (Aug betamethasone dipropionate) .... As needed 9)  Ultram Er 100 Mg Tb24 (Tramadol hcl) .Marland Kitchen.. 1 every 6 hours  as needed 10)  Prednisone 20 Mg Tabs (Prednisone) .... One a day 11)  Skelaxin 800 Mg Tabs (Metaxalone) .... Two times a day 12)  Hycodan 5-1.5 Mg/40ml Syrp (Hydrocodone-homatropine) .Marland Kitchen.. 1 tsp every 6 hours as needed cough 13)  Vitamin D 96295 Unit Caps (Ergocalciferol) .Marland Kitchen.. 1 by mouth once daily 14)  Amlodipine Besylate 10 Mg Tabs (Amlodipine besylate) .... Once daily 15)  Vicodin 5-500 Mg Tabs (Hydrocodone-acetaminophen) .Marland Kitchen.. 1 every 6 hours as needed pain 16)  Epipen 2-pak 0.3 Mg/0.50ml (1:1000) Devi (Epinephrine hcl (anaphylaxis)) .... As needed   Patient Instructions: 1)  Given an EpiPen to use as needed . Refer to Allergy for testing.   Prescriptions: EPIPEN 2-PAK 0.3 MG/0.3ML (1:1000) DEVI (EPINEPHRINE HCL (ANAPHYLAXIS)) as needed  #1 x 5   Entered and Authorized by:   Nelwyn Salisbury MD   Signed by:   Nelwyn Salisbury MD on 03/22/2008   Method used:   Print  then Give to Patient   RxID:   [id number]  ]

## 2010-07-25 NOTE — Progress Notes (Signed)
Summary: Norvasc 10 mg.  Phone Note Call from Patient   Caller: Patient Call For: Dr. Clent Ridges Summary of Call: Pt needs Norvasc 10 mg. called to [facility] [phone] Pt. [phone] Initial call taken by: Lynann Beaver CMA,  December 30, 2007 4:08 PM  Follow-up for Phone Call        Phone Call Completed, Rx Called In Follow-up by: Alfred Levins, CMA,  December 30, 2007 5:19 PM      Prescriptions: NORVASC 5 MG  TABS (AMLODIPINE BESYLATE) once daily  #30 x 11   Entered by:   Alfred Levins, CMA   Authorized by:   Nelwyn Salisbury MD   Signed by:   Alfred Levins, CMA on 12/30/2007   Method used:   Faxed to ...       [facility] / [facility]       [address]       [place], [place]  [address]       Ph: [phone]       Fax: [phone]   RxID:   [id number]

## 2010-07-25 NOTE — Progress Notes (Signed)
Summary: Wrong medication called in  Phone Note Call from Patient Call back at [phone]   Caller: [name] live Call For: Clent Ridges Summary of Call: Patient need rx refill for tramadol hcl1-2-every 6 hours as needed. Initial call taken by: Alfred Levins, CMA,  July 06, 2008 9:49 AM  Follow-up for Phone Call        we already did this on 07-01-08 Follow-up by: Nelwyn Salisbury MD,  July 06, 2008 11:08 AM  Additional Follow-up for Phone Call Additional follow up Details #1::        Left message on [name] home phone to check with her pharmacy, if she has questions, call office back. Additional Follow-up by: Sid Falcon LPN,  July 06, 2008 11:39 AM    Additional Follow-up for Phone Call Additional follow up Details #2::    Patient states that the rx that was faxed was for extended release and thats not what she is taking.  Please call in the correct medication. Follow-up by: Barnie Mort,  July 06, 2008 11:59 AM  Additional Follow-up for Phone Call Additional follow up Details #3:: Details for Additional Follow-up Action Taken: change this to Tramadol 50 mg . 1 or 2 every 6 hours as needed pain, #100 with 5 rf  rx called in, [name] aware  Alfred Levins, CMA  July 06, 2008 12:23 PM Additional Follow-up by: Nelwyn Salisbury MD,  July 06, 2008 12:19 PM

## 2010-07-25 NOTE — Progress Notes (Signed)
Summary: hydrocodone refill  Phone Note From Pharmacy   [facility]er[facility] Details for Reason: refill request Summary of Call: Pharm is requesting a refill of hydrocone 7.5/750 is this okay to fill? Initial call taken by: Kern Reap CMA,  September 15, 2008 2:22 PM  Follow-up for Phone Call        call in #120 with 5 rf Follow-up by: Nelwyn Salisbury MD,  September 15, 2008 5:15 PM  Additional Follow-up for Phone Call Additional follow up Details #1::        Pharmacist called Additional Follow-up by: Kern Reap CMA,  September 15, 2008 5:20 PM

## 2010-07-25 NOTE — Letter (Signed)
Summary: [facility]  [facility]   Imported By: Maryln Gottron 09/01/2008 14:02:02  _____________________________________________________________________  External Attachment:    Type:   Image     Comment:   External Document

## 2010-07-25 NOTE — Assessment & Plan Note (Signed)
Summary: EAR DRAINING AGAIN/PS   Vital Signs:  Patient profile:   55 year old female Weight:      156 pounds Temp:     98.3 degrees F Pulse rate:   97 / minute BP sitting:   120 / 80  (left arm)  Vitals Entered By: Pura Spice, RN (April 03, 2010 3:22 PM) CC: left ear draining wants flu shot    History of Present Illness: Here for a week of discomfort and draining from the left ear. She used Cortisporin drops for this last winter andit worked well then. However she has been using thses for several days with no improvement. No othe symptoms.   Preventive Screening-Counseling & Management  Alcohol-Tobacco     Smoking Status: current     Packs/Day: 1.0  Allergies (verified): No Known Drug Allergies  Past History:  Past Medical History: Reviewed history from 11/15/2009 and no changes required. GOUT HYPERTENSION  HYPERLIPIDEMIA MARFAN'S SYNDROME  GERD  Blindness Internal Hemorrhoids Hx of Alcholism( in remission) Hepatic cyst  Diverticulosis Chronic UTI, sees Dr. Patsi Sears IBS-C Allergic reactions/? angioedema Rheumatoid arthritis  Past History:  Care Management: Gynecology: Dr Darcel Bayley  Social History: Packs/Day:  1.0  Review of Systems  The patient denies anorexia, fever, weight loss, weight gain, vision loss, decreased hearing, hoarseness, chest pain, syncope, dyspnea on exertion, peripheral edema, prolonged cough, headaches, hemoptysis, abdominal pain, melena, hematochezia, severe indigestion/heartburn, hematuria, incontinence, genital sores, muscle weakness, suspicious skin lesions, transient blindness, difficulty walking, depression, unusual weight change, abnormal bleeding, enlarged lymph nodes, angioedema, breast masses, and testicular masses.         Flu Vaccine Consent Questions     Do you have a history of severe allergic reactions to this vaccine? no    Any prior history of allergic reactions to egg and/or gelatin? no    Do you have a  sensitivity to the preservative Thimersol? no    Do you have a past history of Guillan-Barre Syndrome? no    Do you currently have an acute febrile illness? no    Have you ever had a severe reaction to latex? no    Vaccine information given and explained to patient? yes    Are you currently pregnant? no    Lot Number:[id number]   Exp Date:12/23/2010   Site Given  Left Deltoid IM Pura Spice, RN  April 03, 2010 3:29 PM   Physical Exam  Head:  Normocephalic and atraumatic without obvious abnormalities. No apparent alopecia or balding. Eyes:  No corneal or conjunctival inflammation noted. EOMI. Perrla. Funduscopic exam benign, without hemorrhages, exudates or papilledema. Vision grossly normal. Ears:  left ear canal is red, TM is clear. Right ear is clear.  Nose:  External nasal examination shows no deformity or inflammation. Nasal mucosa are pink and moist without lesions or exudates. Mouth:  Oral mucosa and oropharynx without lesions or exudates.  Teeth in good repair. Neck:  No deformities, masses, or tenderness noted.   Impression & Recommendations:  Problem # 1:  OTITIS EXTERNA (ICD-380.10)  Her updated medication list for this problem includes:    Ciprodex 0.3-0.1 % Susp (Ciprofloxacin-dexamethasone) .Marland Kitchen... Apply 5 drops in ear 4 times a day as needed  Complete Medication List: 1)  Amitiza 24 Mcg Caps (Lubiprostone) .... Take 1 capsule by mouth twice a day 2)  Nexium 40 Mg Cpdr (Esomeprazole magnesium) .Marland Kitchen.. 1 once daily 3)  Colchicine 0.6 Mg Tabs (Colchicine) .Marland Kitchen.. 1 by mouth two times a day 4)  Stool Softner  .... As needed 5)  Diprolene Af 0.05 % Crea (Aug betamethasone dipropionate) .... As needed 6)  Tramadol Hcl 50 Mg Tabs (Tramadol hcl) .Marland Kitchen.. 1-2 by mouth every 6 hours as needed for pain 7)  Prednisone 10 Mg Tabs (Prednisone) .... 2 by mouth once daily 8)  Skelaxin 800 Mg Tabs (Metaxalone) .... Two times a day 9)  Vitamin D 09811 Unit Caps (Ergocalciferol) .Marland Kitchen.. 1 by  mouth once daily 10)  Amlodipine Besylate 10 Mg Tabs (Amlodipine besylate) .... Once daily 11)  Vicodin 5-500 Mg Tabs (Hydrocodone-acetaminophen) .Marland Kitchen.. 1 every 6 hours as needed pain 12)  Epipen 2-pak 0.3 Mg/0.16ml (1:1000) Devi (Epinephrine hcl (anaphylaxis)) .... As needed 13)  Actonel 150 Mg Tabs (Risedronate sodium) .Marland Kitchen.. 1 tablet by mouth every month 14)  Allopurinol 100 Mg Tabs (Allopurinol) .Marland Kitchen.. 1 by mouth once daily 15)  Cephalexin 250 Mg Tabs (Cephalexin) .... Once daily 16)  Zyrtec Allergy 10 Mg Caps (Cetirizine hcl) .Marland Kitchen.. 1 once daily 17)  Ciprodex 0.3-0.1 % Susp (Ciprofloxacin-dexamethasone) .... Apply 5 drops in ear 4 times a day as needed  Other Orders: Admin 1st Vaccine (91478) Flu Vaccine 73yrs + (29562) Pneumococcal Vaccine (13086) Admin of Any Addtl Vaccine (57846)  Patient Instructions: 1)  try Ciprodex drops.  2)  Please schedule a follow-up appointment as needed .  Prescriptions: CIPRODEX 0.3-0.1 % SUSP (CIPROFLOXACIN-DEXAMETHASONE) apply 5 drops in ear 4 times a day as needed  #10 x 2   Entered and Authorized by:   Nelwyn Salisbury MD   Signed by:   Nelwyn Salisbury MD on 04/03/2010   Method used:   Print then Give to Patient   RxID:   [id number]    Immunizations Administered:  Pneumonia Vaccine:    Vaccine Type: Pneumovax    Site: right deltoid    Mfr: Merck    Dose: 0.5 ml    Route: IM    Given by: Pura Spice, RN    Exp. Date: 09/07/2011    Lot #: [id number]    VIS given: 05/30/09 version given April 03, 2010.

## 2010-07-25 NOTE — Procedures (Signed)
Summary: Colonoscopy   Colonoscopy  Procedure date:  01/19/2003  Findings:      Location:  [facility].    Procedures Next Due Date:    Colonoscopy: 12/2007  Patient Name: [name], [name] MRN:  Procedure Procedures: Colonoscopy CPT: [address].  Personnel: Endoscopist: Iva Boop, MD, [facility].  Referred By: Gershon Crane, MD.  Exam Location: Exam performed in Outpatient Clinic. Outpatient  Patient Consent: Procedure, Alternatives, Risks and Benefits discussed, consent obtained, from patient. Consent was obtained by the RN.  Indications Symptoms: Constipation Weight Loss. Abdominal pain / bloating. Change in bowel habits.  History  Pre-Exam Physical: Performed Jan 19, 2003. Cardio-pulmonary exam, Rectal exam, HEENT exam , Abdominal exam, Mental status exam WNL.  Exam Exam: Extent of exam reached: Cecum, extent intended: Cecum.  The cecum was identified by appendiceal orifice and IC valve. Patient position: left side to back. Colon retroflexion performed. Images taken. ASA Classification: III. Tolerance: good.  Monitoring: Pulse and BP monitoring, Oximetry used. Supplemental O2 given.  Colon Prep Used Golytely for colon prep. Prep results: good.  Sedation Meds: Patient assessed and found to be appropriate for moderate (conscious) sedation. Sedation was managed by the Endoscopist. Fentanyl 75 mcg. given IV. Versed 8 mg. given IV.  Findings - DIVERTICULOSIS: Ascending Colon to Sigmoid Colon. Not bleeding. ICD9: Diverticulosis, Colon: 562.10. Comments: SEVERE ON LEFT.  NORMAL EXAM: Cecum.  MULTIPLE POLYPS: Sigmoid Colon. minimum size 3 mm, maximum size 4 mm. Procedure:  biopsy without cautery, removed, Polyp retrieved, 2 polyps Polyps sent to pathology. ICD9: Neoplasia, Benign, Large Bowel: 211.3. Comments: 2 POLYPS.  OTHER FINDING: APHTHOUS ULCERS found in Sigmoid Colon. Biopsy/Other Finding taken. Comments: MULTIPLE DISCRETE ULCERS.  HEMORRHOIDS:  Internal. Size: Grade II. Not bleeding. Not thrombosed. ICD9: Hemorrhoids, Internal and  External: 455.6.   Assessment Abnormal examination, see findings above.  Diagnoses: 562.10: Diverticulosis, Colon.  211.3: Neoplasia, Benign, Large Bowel.  455.6: Hemorrhoids, Internal and  External.   Comments: 2 TINY POLYPS SEVERE DIVERTICULOSIS HEMORRHOIDS ULCERS, UNCERTAIN SIGNIFICANCE Events  Unplanned Interventions: No intervention was required.  Plans Patient Education: Patient given standard instructions for: Diverticulosis. Hemorrhoids.  Comments: TRY MIRALAX Disposition: After procedure patient sent to recovery. After recovery patient sent home.  Scheduling/Referral: Clinic Visit, to Iva Boop, MD, [place], [address],    CC:   Gershon Crane, MD  Appended Document: Colonoscopy pathology was hyperplastic polyps and benign mucosa on biopsies of ulcers

## 2010-07-25 NOTE — Progress Notes (Signed)
Summary: increase dose  Phone Note Call from Patient Call back at Home Phone [phone]   Caller: Patient Call For: fry Summary of Call: pt wants to get back on the prednisone 20mg .  You wanted her to try the 10mg  but she is still having a lot of pain [facility] Initial call taken by: Alfred Levins, CMA,  July 18, 2009 8:17 AM  Follow-up for Phone Call        call in Prednisone 10 mg to take 2 tabs once daily , #60 with 11 rf Follow-up by: Nelwyn Salisbury MD,  July 18, 2009 1:26 PM  Additional Follow-up for Phone Call Additional follow up Details #1::        Phone Call Completed, Rx Called In Additional Follow-up by: Alfred Levins, CMA,  July 18, 2009 1:48 PM    New/Updated Medications: PREDNISONE 10 MG  TABS (PREDNISONE) 2 by mouth once daily Prescriptions: PREDNISONE 10 MG  TABS (PREDNISONE) 2 by mouth once daily  #60 x 11   Entered by:   Alfred Levins, CMA   Authorized by:   Nelwyn Salisbury MD   Signed by:   Alfred Levins, CMA on 07/18/2009   Method used:   Faxed to ...       [facility] (retail)       [address]       [place], [place]  [address]       Ph: [phone]       Fax: [phone]   RxID:   [id number]

## 2010-07-25 NOTE — Progress Notes (Signed)
Summary: Vicodin  prednisone refill as well   Phone Note Call from Patient   Caller: Patient Summary of Call: [facility] [address] [phone] Vicodin refill. Initial call taken by: Lynann Beaver CMA,  May 14, 2008 12:08 PM  Follow-up for Phone Call        pt calling again about vicodin refill and prednisone refill as well. [facility] [address]  [phone] Roselle Locus  May 14, 2008 4:34 PM  Additional Follow-up for Phone Call Additional follow up Details #1::        call in Vicodin #120 with 5 rf and prednisone #30 with 11 rf Additional Follow-up by: Nelwyn Salisbury MD,  May 14, 2008 4:57 PM    Additional Follow-up for Phone Call Additional follow up Details #2::    rx called in, pt aware Follow-up by: Alfred Levins, CMA,  May 14, 2008 5:01 PM    Prescriptions: VICODIN 5-500 MG  TABS (HYDROCODONE-ACETAMINOPHEN) 1 every 6 hours as needed pain  #120 x 5   Entered by:   Alfred Levins, CMA   Authorized by:   Nelwyn Salisbury MD   Signed by:   Alfred Levins, CMA on 05/14/2008   Method used:   Telephoned to ...       [facility] (retail)       [address]       [place], [place]  [address]       Ph: [phone]       Fax: [phone]   RxID:   [id number] PREDNISONE 20 MG  TABS (PREDNISONE) one a day  #30 x 11   Entered by:   Alfred Levins, CMA   Authorized by:   Nelwyn Salisbury MD   Signed by:   Alfred Levins, CMA on 05/14/2008   Method used:   Telephoned to ...       [facility] (retail)       [address]       [place], [place]  [address]       Ph: [phone]       Fax: [phone]   RxID:   [id number]

## 2010-07-25 NOTE — Op Note (Signed)
Summary: Foot Surgery left & right/[facility]  Foot Surgery left & right/[facility]   Imported By: Maryln Gottron 08/10/2008 13:48:18  _____________________________________________________________________  External Attachment:    Type:   Image     Comment:   External Document

## 2010-07-25 NOTE — Miscellaneous (Signed)
Summary: Bone Density  Clinical Lists Changes  Orders: Added new Test order of T-Bone Densitometry (77080) - Signed Added new Test order of T-Lumbar Vertebral Assessment (77082) - Signed 

## 2010-07-25 NOTE — Progress Notes (Signed)
Summary: ER  Phone Note Call from Patient Call back at [phone]   Caller: live Summary of Call: [facility] ER last night 1am for allergic reaction to something.  Whelped up all over body & lips twisted like a stroke.  Doctor there thought too long to be medications, because on them so long.  Rec allergy testing.  Has happened before, but worse last night.  Benadryl & IV steroid & already on prednisone 20mg  day.   Initial call taken by: Rudy Jew, RN,  March 17, 2008 11:48 AM  Follow-up for Phone Call        noted Follow-up by: Nelwyn Salisbury MD,  March 17, 2008 11:52 AM

## 2010-07-25 NOTE — Progress Notes (Signed)
Summary: does she need to continue all her meds   Phone Note Call from Patient Call back at Home Phone [phone]   Caller: patient triage message Call For: fry Summary of Call: Does she need to continue all her meds particularly the Vitamin D since she is on Osteoporsis medicine.  How severe is the osteoporasis  Initial call taken by: Roselle Locus,  June 09, 2007 2:07 PM  Follow-up for Phone Call        it is moderately severe, and yes continue meds as prescribed Follow-up by: Nelwyn Salisbury MD,  June 10, 2007 8:16 AM  Additional Follow-up for Phone Call Additional follow up Details #1::        Phone Call Completed Additional Follow-up by: Alfred Levins, CMA,  June 10, 2007 1:31 PM

## 2010-07-25 NOTE — Assessment & Plan Note (Signed)
Summary: FU FROM REHAB/NJR OK PER DOC/NJR//res per pt/jnl  Medications Added AMITIZA 24 MCG CAPS (LUBIPROSTONE) Take 1 capsule by mouth twice a day NEXIUM 40 MG CPDR (ESOMEPRAZOLE MAGNESIUM) 1 once daily CLARINEX 5 MG  TABS (DESLORATADINE) 1 once daily CEPHALEXIN 250 MG  CAPS (CEPHALEXIN) 1 once daily ASPIR-LOW 81 MG TBEC (ASPIRIN) 1 once daily COLCHICINE 0.6 MG TABS (COLCHICINE) 1 by mouth two times a day * STOOL SOFTNER as needed DARVOCET-N 100 100-650 MG TABS (PROPOXYPHENE N-APAP) as needed DIPROLENE AF 0.05 %  CREA (AUG BETAMETHASONE DIPROPIONATE) as needed ULTRAM ER 100 MG  TB24 (TRAMADOL HCL) 1 every 6 hours as needed PREDNISONE 20 MG  TABS (PREDNISONE) one a day      Allergies Added: NKDA  Vital Signs:  Patient Profile:   55 Years Old Female Temp:     98.7 degrees F (37.06 degrees C) oral Pulse rate:   80 / minute Pulse rhythm:   regular BP sitting:   100 / 72  (right arm)  Vitals Entered By: Alfred Levins, CMA (January 14, 2007 2:41 PM)              Patient in wheelchair did not get weight  Chief Complaint:  Hosp f/u.  History of Present Illness: Was in [facility] from 12-04-06 to 12-10-06 per Dr. Turner Daniels for total right knee replacement. Did well. Is through with PT and is now getting OT at home.  Is having more gout pain despite Colchicine, Darvocet, and Tramadol.  Has been off Coumadin for several weeks, also off Mobic since surgery. BP is stable off meds.  Current Allergies: No known allergies   Past Surgical History:    Cervical spine sx-1986    Let eye prosthesis    Total knee replacement right     Review of Systems      See HPI   Physical Exam  General:     Well-developed,well-nourished,in no acute distress; alert,appropriate and cooperative throughout examination. In wheelchair as usual.    Impression & Recommendations:  Problem # 1:  GOUTY ARTHROPATHY (ICD-274.0)  Her updated medication list for this problem includes:    Colchicine 0.6 Mg Tabs  (Colchicine) .Marland Kitchen... 1 by mouth two times a day   Medications Added to Medication List This Visit: 1)  Amitiza 24 Mcg Caps (Lubiprostone) .... Take 1 capsule by mouth twice a day 2)  Nexium 40 Mg Cpdr (Esomeprazole magnesium) .Marland Kitchen.. 1 once daily 3)  Clarinex 5 Mg Tabs (Desloratadine) .Marland Kitchen.. 1 once daily 4)  Cephalexin 250 Mg Caps (Cephalexin) .Marland Kitchen.. 1 once daily 5)  Aspir-low 81 Mg Tbec (Aspirin) .Marland Kitchen.. 1 once daily 6)  Colchicine 0.6 Mg Tabs (Colchicine) .Marland Kitchen.. 1 by mouth two times a day 7)  Stool Softner  .... As needed 8)  Darvocet-n 100 100-650 Mg Tabs (Propoxyphene n-apap) .... As needed 9)  Diprolene Af 0.05 % Crea (Aug betamethasone dipropionate) .... As needed 10)  Ultram Er 100 Mg Tb24 (Tramadol hcl) .Marland Kitchen.. 1 every 6 hours as needed 11)  Prednisone 20 Mg Tabs (Prednisone) .... One a day   Patient Instructions: 1)  stay off Mobic for now 2)  Please schedule a follow-up appointment as needed.    Prescriptions: PREDNISONE 20 MG  TABS (PREDNISONE) one a day  #30 x 5   Entered and Authorized by:   Nelwyn Salisbury MD   Signed by:   Nelwyn Salisbury MD on 01/14/2007   Method used:   Print then Give to Patient  RxID:   [id number]       Orders Added: 1)  Est. Patient Level III [25956]

## 2010-07-25 NOTE — Progress Notes (Signed)
Summary: Opthamalogist referral  Phone Note Call from Patient   Caller: Patient Call For: Nelwyn Salisbury MD Summary of Call: Pt needs a eye MD referral to evaluate her prosthesis .........keeps falling out. [phone] Initial call taken by: Lynann Beaver CMA,  April 28, 2009 2:23 PM  Follow-up for Phone Call        please refer her to Ophthalmology for this Follow-up by: Nelwyn Salisbury MD,  April 29, 2009 9:17 AM  Additional Follow-up for Phone Call Additional follow up Details #1::        Phone Call Completed Additional Follow-up by: Alfred Levins, CMA,  April 29, 2009 10:47 AM

## 2010-07-25 NOTE — Assessment & Plan Note (Signed)
Summary: 2 month fup/njr    Vital Signs:  Patient Profile:   55 Years Old Female Weight:      135 pounds Temp:     98.2 degrees F oral Pulse rate:   98 / minute BP sitting:   128 / 92  (left arm) Cuff size:   regular  Vitals Entered By: Alfred Levins, CMA (July 01, 2007 4:14 PM)                 Chief Complaint:  hosp f/u.  History of Present Illness: Was in ER on 06-17-07 for chest pain. Chest CT was negative for PE but showed a 0.5 cm nodule in the right middle lobe of lungs, also saw a 1.6 cm nodule in the right lobe of the thyroid. Pain was felt to be esophageal. Told to continue her same meds. Has had no more pains since then.  Current Allergies: No known allergies      Review of Systems      See HPI   Physical Exam  General:     Well-developed,well-nourished,in no acute distress; alert,appropriate and cooperative throughout examination Neck:     No deformities, masses, or tenderness noted.    Impression & Recommendations:  Problem # 1:  GERD (ICD-530.81)  Her updated medication list for this problem includes:    Nexium 40 Mg Cpdr (Esomeprazole magnesium) .Marland Kitchen... 1 once daily   Problem # 2:  LUNG NODULE (ICD-518.89)  Problem # 3:  THYROID NODULE (ICD-241.0)  Orders: Radiology Referral (Radiology)   Complete Medication List: 1)  Amitiza 24 Mcg Caps (Lubiprostone) .... Take 1 capsule by mouth twice a day 2)  Nexium 40 Mg Cpdr (Esomeprazole magnesium) .Marland Kitchen.. 1 once daily 3)  Clarinex 5 Mg Tabs (Desloratadine) .Marland Kitchen.. 1 once daily 4)  Cephalexin 250 Mg Caps (Cephalexin) .Marland Kitchen.. 1 once daily 5)  Aspir-low 81 Mg Tbec (Aspirin) .Marland Kitchen.. 1 once daily 6)  Colchicine 0.6 Mg Tabs (Colchicine) .Marland Kitchen.. 1 by mouth two times a day 7)  Stool Softner  .... As needed 8)  Darvocet-n 100 100-650 Mg Tabs (Propoxyphene n-apap) .... As needed 9)  Diprolene Af 0.05 % Crea (Aug betamethasone dipropionate) .... As needed 10)  Ultram Er 100 Mg Tb24 (Tramadol hcl) .Marland Kitchen.. 1 every 6  hours as needed 11)  Prednisone 20 Mg Tabs (Prednisone) .... One a day 12)  Skelaxin 800 Mg Tabs (Metaxalone) .... Two times a day 13)  Hycodan 5-1.5 Mg/93ml Syrp (Hydrocodone-homatropine) .Marland Kitchen.. 1 tsp every 6 hours as needed cough 14)  Vitamin D [address] Unit Caps (Ergocalciferol) .Marland Kitchen.. 1 by mouth once daily   Patient Instructions: 1)  set up thyroid US now, and repeat chest CT in 6 months 2)  Please schedule a follow-up appointment as needed.    ]

## 2010-07-25 NOTE — Assessment & Plan Note (Signed)
Summary: FOLLOW UP IBS/SP    History of Present Illness Visit Type: Follow-up Visit Primary GI MD: [name] MD [facility] Primary Provider: Shellia Carwin MD Chief Complaint: F/U  IBS History of Present Illness:   This is a 55 year old African American with known IBS and constipation. She reports that the hematochezia is working well and she would like a refill. She has gained quite a bit of weight since I have last seen her and she is on chronic prednisone at 20 mg daily. She is asking me if she should remain on this dose. It has helped her gout tremendously.   GI Review of Systems      Denies abdominal pain, acid reflux, belching, bloating, chest pain, dysphagia with liquids, dysphagia with solids, heartburn, loss of appetite, nausea, vomiting, vomiting blood, weight loss, and  weight gain.      Reports irritable bowel syndrome.     Denies anal fissure, black tarry stools, change in bowel habit, constipation, diarrhea, diverticulosis, fecal incontinence, heme positive stool, hemorrhoids, jaundice, light color stool, liver problems, rectal bleeding, and  rectal pain.    Current Medications (verified): 1)  Amitiza 24 Mcg Caps (Lubiprostone) .... Take 1 Capsule By Mouth Twice A Day 2)  Nexium 40 Mg Cpdr (Esomeprazole Magnesium) .Marland Kitchen.. 1 Once Daily 3)  Allegra 180 Mg Tabs (Fexofenadine Hcl) .... Once Daily 4)  Colchicine 0.6 Mg Tabs (Colchicine) .Marland Kitchen.. 1 By Mouth Two Times A Day 5)  Stool Softner .... As Needed 6)  Diprolene Af 0.05 %  Crea (Aug Betamethasone Dipropionate) .... As Needed 7)  Ultram Er 100 Mg  Tb24 (Tramadol Hcl) .Marland Kitchen.. 1-2  Every 6 Hours As Needed 8)  Prednisone 20 Mg  Tabs (Prednisone) .... One A Day 9)  Skelaxin 800 Mg  Tabs (Metaxalone) .... Two Times A Day 10)  Hycodan 5-1.5 Mg/17ml  Syrp (Hydrocodone-Homatropine) .Marland Kitchen.. 1 Tsp Every 6 Hours As Needed Cough 11)  Vitamin D 69629 Unit  Caps (Ergocalciferol) .Marland Kitchen.. 1 By Mouth Once Daily 12)  Amlodipine Besylate 10 Mg  Tabs (Amlodipine  Besylate) .... Once Daily 13)  Vicodin 5-500 Mg  Tabs (Hydrocodone-Acetaminophen) .Marland Kitchen.. 1 Every 6 Hours As Needed Pain 14)  Epipen 2-Pak 0.3 Mg/0.37ml (1:1000) Devi (Epinephrine Hcl (Anaphylaxis)) .... As Needed 15)  Actonel 150 Mg  Tabs (Risedronate Sodium) .Marland Kitchen.. 1 Tablet By Mouth Every Month 16)  Allopurinol 100 Mg Tabs (Allopurinol) .Marland Kitchen.. 1 By Mouth Once Daily 17)  Cortisporin 3.5-10000-1 Soln (Neomycin-Polymyxin-Hc) .... 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 angioedema  Past Surgical History: Reviewed history from 07/22/2008 and no changes required. Cervical spine sx-1986 per Jeral Fruit Let eye prosthesis Total knee replacement bilateral Foot Surgery, bilateral- multiple operations, sees Dr. Raynald Kemp at [facility] Neck Surgery, c-spine fracture Hernia Surgery right femoral 2008  Family History: Reviewed history from 01/07/2007 and no changes required. Family History of Alcoholism/Addiction Family History Diabetes 1st degree relative  Social History: Reviewed history from 01/08/2008 and no changes required. Patient currently smokes.  1 pack per day Alcohol Use - no Illicit Drug Use - no Patient does not get regular exercise.   Vital Signs:  Patient profile:   55 year old female Height:      69 inches Weight:  169.13 pounds BMI:     25.07 Pulse rate:   88 / minute Pulse rhythm:   regular BP sitting:   128 / 90  (left arm) Cuff size:   regular  Vitals Entered By: June McMurray CMA Duncan Dull) (May 30, 2009 1:16 PM)  Physical Exam  General:  Well developed, well nourished, no acute distress.   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 Clent Ridges.  Patient Instructions: 1)  Please pick up your medications at your pharmacy.  2)  Please call or schedule an appointment with Dr.Fry to discuss tapering Prednisone if possible. 3)  The medication list was reviewed and reconciled.  All changed / newly prescribed medications were explained.  A complete medication list was provided to the patient / caregiver. Prescriptions: AMITIZA 24 MCG CAPS (LUBIPROSTONE) Take 1 capsule by mouth twice a day  #60 x 11   Entered by:   Francee Piccolo CMA (AAMA)   Authorized by:   Iva Boop MD, [facility]   Signed by:   Francee Piccolo CMA (AAMA) on 05/30/2009   Method used:   Faxed to ...       [facility] (retail)       [address]       [place], [place]  [address]       Ph: [phone]       Fax: [phone]   RxID:   [id number]

## 2010-07-25 NOTE — Letter (Signed)
Summary: [facility]  [facility]   Imported By: Maryln Gottron 05/26/2008 13:11:45  _____________________________________________________________________  External Attachment:    Type:   Image     Comment:   External Document

## 2010-07-25 NOTE — Progress Notes (Signed)
Summary: note needed  Phone Note Call from Patient Call back at [phone]   Caller: vm & live Call For: Nester Bachus Summary of Call: Needs note faxed to [phone]  Att. Ms. 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   Please call & let me know when this is done.  Initial call taken by: Rudy Jew, RN,  March 25, 2008 3:31 PM  Follow-up for Phone Call        done Follow-up by: Nelwyn Salisbury MD,  March 26, 2008 8:35 AM  Additional Follow-up for Phone Call Additional follow up Details #1::        note faxed Additional Follow-up by: Alfred Levins, CMA,  March 26, 2008 11:28 AM

## 2010-07-25 NOTE — Progress Notes (Signed)
Summary: Med RF  Phone Note Call from Patient Call back at Home Phone [phone]   Caller: Patient Call For: [name] Reason for Call: Refill Medication Details for Reason: Med RF Summary of Call: Needs Amitiza RF sent to [facility] [address] Initial call taken by: Darryl Lent CMA Duncan Dull),  April 26, 2009 1:26 PM  Follow-up for Phone Call        Refill sent on 04/26/09.  Message left for pt to return call. Francee Piccolo CMA (AAMA)  April 28, 2009 10:46 AM   RC from pt.  She only has problems if she does not take her medication.  Pt only follows up with Dr. Clent Ridges if she is having a problem and has not seen him since Feb.  Appt scheduled for 05/30/09 to see Dr.Gessner. Follow-up by: Francee Piccolo CMA (AAMA),  April 28, 2009 11:03 AM    Prescriptions: AMITIZA 24 MCG CAPS (LUBIPROSTONE) Take 1 capsule by mouth twice a day  #60 x 0   Entered by:   Francee Piccolo CMA (AAMA)   Authorized by:   Iva Boop MD, [facility]   Signed by:   Francee Piccolo CMA (AAMA) on 04/26/2009   Method used:   Faxed to ...       [facility] (retail)       [address]       [place], [place]  [address]       Ph: [phone]       Fax: [phone]   RxID:   [id number]

## 2010-07-25 NOTE — Progress Notes (Signed)
Summary: ? MRI?  Phone Note Call from Patient   Caller: Patient Call For: Dr. Clent Ridges Summary of Call: Pt. is calling and asking for the appt for her MRI?  Her leg is worse. Please call [phone] Initial call taken by: [facility] CMA,  September 03, 2008 11:57 AM  Follow-up for Phone Call        this was supposed to have been set up already. Go ahead an set up an MRI of the lower right leg for cellultis and possible abcess Follow-up by: Nelwyn Salisbury MD,  September 03, 2008 12:41 PM  Additional Follow-up for Phone Call Additional follow up Details #1::        Please see orders.  Patient was scheduled for a CT scan by [facility]. MRI of right leg with and without Additional Follow-up by: Barnie Mort,  September 06, 2008 10:07 AM      Appended Document: ? MRI? Patient is scheduled for an MRI of right leg at [facility] on [address]

## 2010-07-25 NOTE — Consult Note (Signed)
[facility]   Imported By: Kassie Mends 08/14/2007 10:31:23  _____________________________________________________________________  External Attachment:    Type:   Image     Comment:   [facility]

## 2010-07-25 NOTE — Progress Notes (Signed)
Summary: Ins not paying for allegra  Phone Note Call from Patient Call back at Home Phone [phone]   Caller: Patient Summary of Call: Pt called and has said that her ins is not paying for the allegra. Pt would like it change to something that they will cover. Pt uses [facility]. I left pt a voicemail to call us back and let us know what they will cover to we can ask Dr. Clent Ridges to change it to that medication when he comes back on Wed. Initial call taken by: Romualdo Bolk, CMA Duncan Dull),  December 05, 2009 8:58 AM  Follow-up for Phone Call        Pt called back saying that her ins will cover claritin and zyrtec. Follow-up by: Romualdo Bolk, CMA (AAMA),  December 06, 2009 10:30 AM  Additional Follow-up for Phone Call Additional follow up Details #1::        then switch to Zyrtec 10 mg once daily . Call in a one year supply Additional Follow-up by: Nelwyn Salisbury MD,  December 07, 2009 1:01 PM    New/Updated Medications: ZYRTEC ALLERGY 10 MG CAPS (CETIRIZINE HCL) 1 once daily Prescriptions: ZYRTEC ALLERGY 10 MG CAPS (CETIRIZINE HCL) 1 once daily  #30 x 11   Entered by:   Raechel Ache, RN   Authorized by:   Nelwyn Salisbury MD   Signed by:   Raechel Ache, RN on 12/07/2009   Method used:   Faxed to ...       [facility] (retail)       [address]       [place], [place]  [address]       Ph: [phone]       Fax: [phone]   RxID:   [id number]

## 2010-07-25 NOTE — Progress Notes (Signed)
Summary: REQUESTING HOURS  Phone Note Call from Patient Call back at Home Phone [phone]   Caller: Patient Call For: FRY Summary of Call: WAS JUST SEEN TODAY. WANTS ORDER FOR AN EXTENSION OF HER HOME HEALTH AGENCY WITH [facility]. WANTS CAP HOURS INSTEAD OF REGULAR HOURS. Initial call taken by: Warnell Forester,  January 14, 2007 4:52 PM  Follow-up for Phone Call        ok to change Follow-up by: Nelwyn Salisbury MD,  January 14, 2007 5:47 PM

## 2010-07-25 NOTE — Assessment & Plan Note (Signed)
Summary: ear ache/LC   Vital Signs:  Patient profile:   55 year old female Weight:      170 pounds BMI:     25.20 Temp:     98.1 degrees F oral Pulse rate:   73 / minute BP sitting:   120 / 90  (left arm) Cuff size:   regular  Vitals Entered By: Alfred Levins, CMA (January 10, 2009 2:31 PM) CC: ems told her puss was coming out of lt ear   History of Present Illness: For 2 days has had drainage out of the left ear, but no pain or other symptoms. She wears bilateral hearing aids, but has taken the left one out.   Allergies (verified): No Known Drug Allergies  Past History:  Past Medical History: Reviewed history from 01/08/2008 and no changes required. GOUT HYPERTENSION  HYPERLIPIDEMIA MARFAN'S SYNDROME  OSTEOARTHRITIS  GERD  Blindness Internal Hemorrhoids Hx of Alcholism( in remission) Hepatic cyst  Diverticulosis Chronic UTI IBS-C  Review of Systems  The patient denies anorexia, fever, weight loss, weight gain, vision loss, decreased hearing, hoarseness, chest pain, syncope, dyspnea on exertion, peripheral edema, prolonged cough, headaches, hemoptysis, abdominal pain, melena, hematochezia, severe indigestion/heartburn, hematuria, incontinence, genital sores, muscle weakness, suspicious skin lesions, transient blindness, difficulty walking, depression, unusual weight change, abnormal bleeding, enlarged lymph nodes, angioedema, breast masses, and testicular masses.    Physical Exam  General:  Well-developed,well-nourished,in no acute distress; alert,appropriate and cooperative throughout examination Ears:  left external canal has some excess mucus and cerumen but no erythema. The TM is clear except for the small perforation she has had for years Neck:  No deformities, masses, or tenderness noted.   Impression & Recommendations:  Problem # 1:  OTITIS EXTERNA (ICD-380.10)  Her updated medication list for this problem includes:    Cortisporin 3.5-10000-1 Soln  (Neomycin-polymyxin-hc) .Marland KitchenMarland KitchenMarland KitchenMarland Kitchen 5 drops q 6 hours as needed  Complete Medication List: 1)  Amitiza 24 Mcg Caps (Lubiprostone) .... Take 1 capsule by mouth twice a day 2)  Nexium 40 Mg Cpdr (Esomeprazole magnesium) .Marland Kitchen.. 1 once daily 3)  Clarinex 5 Mg Tabs (Desloratadine) .Marland Kitchen.. 1 once daily 4)  Aspir-low 81 Mg Tbec (Aspirin) .Marland Kitchen.. 1 once daily 5)  Colchicine 0.6 Mg Tabs (Colchicine) .Marland Kitchen.. 1 by mouth two times a day 6)  Stool Softner  .... As needed 7)  Diprolene Af 0.05 % Crea (Aug betamethasone dipropionate) .... As needed 8)  Ultram Er 100 Mg Tb24 (Tramadol hcl) .Marland Kitchen.. 1-2  every 6 hours as needed 9)  Prednisone 20 Mg Tabs (Prednisone) .... One a day 10)  Skelaxin 800 Mg Tabs (Metaxalone) .... Two times a day 11)  Hycodan 5-1.5 Mg/80ml Syrp (Hydrocodone-homatropine) .Marland Kitchen.. 1 tsp every 6 hours as needed cough 12)  Vitamin D 16109 Unit Caps (Ergocalciferol) .Marland Kitchen.. 1 by mouth once daily 13)  Amlodipine Besylate 10 Mg Tabs (Amlodipine besylate) .... Once daily 14)  Vicodin 5-500 Mg Tabs (Hydrocodone-acetaminophen) .Marland Kitchen.. 1 every 6 hours as needed pain 15)  Epipen 2-pak 0.3 Mg/0.46ml (1:1000) Devi (Epinephrine hcl (anaphylaxis)) .... As needed 16)  Actonel 150 Mg Tabs (Risedronate sodium) .Marland Kitchen.. 1 tablet by mouth every month 17)  Allopurinol 100 Mg Tabs (Allopurinol) .Marland Kitchen.. 1 by mouth once daily 18)  Fexofenadine Hcl 180 Mg Tabs (Fexofenadine hcl) .Marland Kitchen.. 1 by mouth once daily 19)  Cortisporin 3.5-10000-1 Soln (Neomycin-polymyxin-hc) .... 5 drops q 6 hours as needed  Patient Instructions: 1)  Please schedule a follow-up appointment as needed .  Prescriptions: CORTISPORIN 3.5-10000-1 SOLN (  NEOMYCIN-POLYMYXIN-HC) 5 drops q 6 hours as needed  #10 ml x 0   Entered and Authorized by:   Nelwyn Salisbury MD   Signed by:   Nelwyn Salisbury MD on 01/10/2009   Method used:   Print then Give to Patient   RxID:   [id number]

## 2010-07-25 NOTE — Letter (Signed)
Summary: [facility]  [facility]   Imported By: Maryln Gottron 09/01/2008 14:09:34  _____________________________________________________________________  External Attachment:    Type:   Image     Comment:   External Document

## 2010-07-25 NOTE — Progress Notes (Signed)
Summary: rf  Phone Note Call from Patient Call back at Home Phone [phone]   Caller: Patient-live call Call For: dr fry Reason for Call: Refill Medication Summary of Call: rf colchicine at [facility] . Initial call taken by: Warnell Forester,  April 02, 2008 1:15 PM  Follow-up for Phone Call        Phone Call Completed, Rx Called In Follow-up by: Alfred Levins, CMA,  April 02, 2008 2:36 PM      Prescriptions: COLCHICINE 0.6 MG TABS (COLCHICINE) 1 by mouth two times a day  #60 x 2   Entered by:   Alfred Levins, CMA   Authorized by:   Nelwyn Salisbury MD   Signed by:   Alfred Levins, CMA on 04/02/2008   Method used:   Faxed to ...       Jadene Pierini / [facility] (retail)       [address]       [place], [place]  [address]       Ph: [phone]       Fax: [phone]   RxID:   [id number]

## 2010-07-25 NOTE — Progress Notes (Signed)
Summary: REFILL REQUEST  Phone Note Refill Request Message from:  Fax from Pharmacy on December 02, 2009 8:46 AM  Refills Requested: Medication #1:  AMLODIPINE BESYLATE 10 MG  TABS once daily   Notes: [facility].    Initial call taken by: Debbra Riding,  December 02, 2009 8:47 AM  Follow-up for Phone Call        Rx faxed to pharmacy Follow-up by: Romualdo Bolk, CMA Duncan Dull),  December 02, 2009 9:47 AM    Prescriptions: AMLODIPINE BESYLATE 10 MG  TABS (AMLODIPINE BESYLATE) once daily  #30 x 2   Entered by:   Romualdo Bolk, CMA (AAMA)   Authorized by:   Nelwyn Salisbury MD   Signed by:   Romualdo Bolk, CMA (AAMA) on 12/02/2009   Method used:   Faxed to ...       [facility] (retail)       [address]       [place], [place]  [address]       Ph: [phone]       Fax: [phone]   RxID:   [id number]

## 2010-07-25 NOTE — Letter (Signed)
Summary: [facility]  [facility]   Imported By: Maryln Gottron 08/10/2008 13:45:10  _____________________________________________________________________  External Attachment:    Type:   Image     Comment:   External Document

## 2010-07-25 NOTE — Assessment & Plan Note (Signed)
Summary: Right side pain X 1 month/nn/PT RESCD/CCM   Vital Signs:  Patient Profile:   55 Years Old Female Weight:      154 pounds Temp:     98.0 degrees F oral BP sitting:   162 / 90  (left arm) Cuff size:   regular  Vitals Entered By: Alfred Levins, CMA (Nov 10, 2007 10:54 AM)                 Chief Complaint:  rt side pain, stomach pain, and hx of hernia.  History of Present Illness: Here for pain in the right groin, also her BP has gone back up. has been off Norvasc for about 6 months.    Current Allergies: No known allergies   Past Medical History:    Reviewed history from 04/12/2007 and no changes required:       GOUTY ARTHROPATHY (ICD-274.0)       HYPERTENSION (ICD-401.9)       HYPERLIPIDEMIA (ICD-272.4)       FAMILY HISTORY DIABETES 1ST DEGREE RELATIVE (ICD-V18.0)       FAMILY HISTORY OF ALCOHOLISM/ADDICTION (ICD-V61.41)       MARFAN'S SYNDROME (ICD-759.82)       OSTEOARTHRITIS (ICD-715.90)       GERD (ICD-530.81)       Blindness       Internal Hemorrhoids       Hx of Alcholism( in remission)          Past Surgical History:    Reviewed history from 05/07/2007 and no changes required:       Cervical spine sx-1986 per Jeral Fruit       Let eye prosthesis       Total knee replacement right       Foot Surgery, bilateral- multiple operations       Neck Surgery     Review of Systems      See HPI   Physical Exam  General:     Well-developed,well-nourished,in no acute distress; alert,appropriate and cooperative throughout examination Abdomen:     Bowel sounds positive,abdomen soft and non-tender without masses, organomegaly or hernias noted. Msk:     tender over right groin and proximal hip flexors    Impression & Recommendations:  Problem # 1:  OSTEOARTHRITIS (ICD-715.90)  The following medications were removed from the medication list:    Darvocet-n 100 100-650 Mg Tabs (Propoxyphene n-apap) .Marland Kitchen... As needed  Her updated medication list for this  problem includes:    Aspir-low 81 Mg Tbec (Aspirin) .Marland Kitchen... 1 once daily    Ultram Er 100 Mg Tb24 (Tramadol hcl) .Marland Kitchen... 1 every 6 hours as needed    Vicodin 5-500 Mg Tabs (Hydrocodone-acetaminophen) .Marland Kitchen... 1 every 6 hours as needed pain   Problem # 2:  HYPERTENSION (ICD-401.9)  Her updated medication list for this problem includes:    Norvasc 5 Mg Tabs (Amlodipine besylate) ..... Once daily   Complete Medication List: 1)  Amitiza 24 Mcg Caps (Lubiprostone) .... Take 1 capsule by mouth twice a day 2)  Nexium 40 Mg Cpdr (Esomeprazole magnesium) .Marland Kitchen.. 1 once daily 3)  Clarinex 5 Mg Tabs (Desloratadine) .Marland Kitchen.. 1 once daily 4)  Cephalexin 250 Mg Caps (Cephalexin) .Marland Kitchen.. 1 once daily 5)  Aspir-low 81 Mg Tbec (Aspirin) .Marland Kitchen.. 1 once daily 6)  Colchicine 0.6 Mg Tabs (Colchicine) .Marland Kitchen.. 1 by mouth two times a day 7)  Stool Softner  .... As needed 8)  Diprolene Af 0.05 % Crea (Aug betamethasone dipropionate) .... As  needed 9)  Ultram Er 100 Mg Tb24 (Tramadol hcl) .Marland Kitchen.. 1 every 6 hours as needed 10)  Prednisone 20 Mg Tabs (Prednisone) .... One a day 11)  Skelaxin 800 Mg Tabs (Metaxalone) .... Two times a day 12)  Hycodan 5-1.5 Mg/40ml Syrp (Hydrocodone-homatropine) .Marland Kitchen.. 1 tsp every 6 hours as needed cough 13)  Vitamin D 78295 Unit Caps (Ergocalciferol) .Marland Kitchen.. 1 by mouth once daily 14)  Norvasc 5 Mg Tabs (Amlodipine besylate) .... Once daily 15)  Vicodin 5-500 Mg Tabs (Hydrocodone-acetaminophen) .Marland Kitchen.. 1 every 6 hours as needed pain   Patient Instructions: 1)  Please schedule a follow-up appointment as needed.   Prescriptions: VICODIN 5-500 MG  TABS (HYDROCODONE-ACETAMINOPHEN) 1 every 6 hours as needed pain  #60 x 5   Entered and Authorized by:   Nelwyn Salisbury MD   Signed by:   Nelwyn Salisbury MD on 11/10/2007   Method used:   Print then Give to Patient   RxID:   [id number] NORVASC 5 MG  TABS (AMLODIPINE BESYLATE) once daily  #30 x 11   Entered and Authorized by:   Nelwyn Salisbury MD   Signed by:    Nelwyn Salisbury MD on 11/10/2007   Method used:   Print then Give to Patient   RxID:   [id number]  ]

## 2010-07-25 NOTE — Assessment & Plan Note (Signed)
Summary: leg still swelling/mhf   Vital Signs:  Patient Profile:   55 Years Old Female Height:     69 inches Temp:     98.2 degrees F oral BP sitting:   138 / 102  (left arm) Cuff size:   regular  Vitals Entered By: Alfred Levins, CMA (August 24, 2008 1:32 PM)                 PCP:  Shellia Carwin MD  Chief Complaint:  legs still swelling.  History of Present Illness: Here to recheck cellulitis of the lower right leg. This has been going on for about 6 weeks now. She has taken courses of Keflex and Levaquin with no improvement. The leg remains swollen, red or dark, warm, and mildly painful. No fevers. Had negative venous doppler a few weeks ago.     Current Allergies (reviewed today): No known allergies   Past Medical History:    Reviewed history from 01/08/2008 and no changes required:       GOUT       HYPERTENSION        HYPERLIPIDEMIA       MARFAN'S SYNDROME        OSTEOARTHRITIS        GERD        Blindness       Internal Hemorrhoids       Hx of Alcholism( in remission)       Hepatic cyst        Diverticulosis       Chronic UTI       IBS-C     Review of Systems  The patient denies anorexia, fever, weight loss, weight gain, vision loss, decreased hearing, hoarseness, chest pain, syncope, dyspnea on exertion, peripheral edema, prolonged cough, headaches, hemoptysis, abdominal pain, melena, hematochezia, severe indigestion/heartburn, hematuria, incontinence, genital sores, muscle weakness, suspicious skin lesions, transient blindness, difficulty walking, depression, unusual weight change, abnormal bleeding, enlarged lymph nodes, angioedema, breast masses, and testicular masses.     Physical Exam  General:     Well-developed,well-nourished,in no acute distress; alert,appropriate and cooperative throughout examination Extremities:     right lower leg is dark, warm, mildly tender. There is a fluctuant area on the lateral lower leg.     Impression &  Recommendations:  Problem # 1:  CELLULITIS AND ABSCESS OF LEG EXCEPT FOOT (ICD-682.6)  The following medications were removed from the medication list:    Cephalexin 500 Mg Tabs (Cephalexin) .Marland Kitchen... Take one by mouth three times a day  Her updated medication list for this problem includes:    Doxycycline Hyclate 100 Mg Caps (Doxycycline hyclate) .Marland Kitchen..Marland Kitchen Two times a day  Orders: Venipuncture (16109) TLB-CBC Platelet - w/Differential (85025-CBCD) TLB-BMP (Basic Metabolic Panel-BMET) (80048-METABOL) Radiology Referral (Radiology)   Complete Medication List: 1)  Amitiza 24 Mcg Caps (Lubiprostone) .... Take 1 capsule by mouth twice a day 2)  Nexium 40 Mg Cpdr (Esomeprazole magnesium) .Marland Kitchen.. 1 once daily 3)  Clarinex 5 Mg Tabs (Desloratadine) .Marland Kitchen.. 1 once daily 4)  Aspir-low 81 Mg Tbec (Aspirin) .Marland Kitchen.. 1 once daily 5)  Colchicine 0.6 Mg Tabs (Colchicine) .Marland Kitchen.. 1 by mouth two times a day 6)  Stool Softner  .... As needed 7)  Diprolene Af 0.05 % Crea (Aug betamethasone dipropionate) .... As needed 8)  Ultram Er 100 Mg Tb24 (Tramadol hcl) .Marland Kitchen.. 1-2  every 6 hours as needed 9)  Prednisone 20 Mg Tabs (Prednisone) .... One a day 10)  Skelaxin 800  Mg Tabs (Metaxalone) .... Two times a day 11)  Hycodan 5-1.5 Mg/67ml Syrp (Hydrocodone-homatropine) .Marland Kitchen.. 1 tsp every 6 hours as needed cough 12)  Vitamin D 16109 Unit Caps (Ergocalciferol) .Marland Kitchen.. 1 by mouth once daily 13)  Amlodipine Besylate 10 Mg Tabs (Amlodipine besylate) .... Once daily 14)  Vicodin 5-500 Mg Tabs (Hydrocodone-acetaminophen) .Marland Kitchen.. 1 every 6 hours as needed pain 15)  Epipen 2-pak 0.3 Mg/0.32ml (1:1000) Devi (Epinephrine hcl (anaphylaxis)) .... As needed 16)  Actonel 150 Mg Tabs (Risedronate sodium) .Marland Kitchen.. 1 tablet by mouth every month 17)  Allopurinol 100 Mg Tabs (Allopurinol) .Marland Kitchen.. 1 by mouth once daily 18)  Fexofenadine Hcl 180 Mg Tabs (Fexofenadine hcl) .Marland Kitchen.. 1 by mouth once daily 19)  Doxycycline Hyclate 100 Mg Caps (Doxycycline hyclate) .... Two  times a day   Patient Instructions: 1)  She has cellulitis of the leg and possibly an abcess. Set up a contrasted CT of the leg soon to evaluate. Get labs today. Switch to Doxycycline.    Prescriptions: DOXYCYCLINE HYCLATE 100 MG CAPS (DOXYCYCLINE HYCLATE) two times a day  #60 x 0   Entered and Authorized by:   Nelwyn Salisbury MD   Signed by:   Nelwyn Salisbury MD on 08/24/2008   Method used:   Print then Give to Patient   RxID:   [id number]

## 2010-07-25 NOTE — Progress Notes (Signed)
Summary: prednisone ?  Phone Note Call from Patient Call back at Home Phone [phone]   Caller: live Call For: fry Summary of Call: Her GI doctor Dr. Camelia Eng told her to ask Dr. Clent Ridges about a lower prednisone dose.  Is on 20mg  daily.  Not having any symptoms of pain or any GI symptoms.  Is gaining too much weight.   Initial call taken by: Rudy Jew, RN,  May 30, 2009 2:59 PM  Follow-up for Phone Call        set up an OV soon so we can discuss this Follow-up by: Nelwyn Salisbury MD,  May 30, 2009 5:15 PM  Additional Follow-up for Phone Call Additional follow up Details #1::        LMTCB Additional Follow-up by: Alfred Levins, CMA,  June 01, 2009 8:27 AM    Additional Follow-up for Phone Call Additional follow up Details #2::    appt scheduled Follow-up by: Alfred Levins, CMA,  June 01, 2009 1:32 PM

## 2010-07-25 NOTE — Progress Notes (Signed)
Summary: different med  Phone Note Call from Patient Call back at [phone]   Caller: vm Call For: fry Summary of Call: Need different gout medicine that Medicaid will pay for. Colchicine has been costing from $17 to $42 dollars per month.  [facility] [phone].  NKDA.    Initial call taken by: Rudy Jew, RN,  June 03, 2008 3:42 PM  Follow-up for Phone Call        if she finds out what they will pay for, I'll be glad to prescribe it Follow-up by: Nelwyn Salisbury MD,  June 04, 2008 8:41 AM  Additional Follow-up for Phone Call Additional follow up Details #1::        LMTCB Sid Falcon LPN  June 04, 2008 9:42 AM       Appended Document: different med Insurance will pay for allopurinol.

## 2010-07-25 NOTE — Assessment & Plan Note (Signed)
Summary: fu from rehab/njr  Medications Added SKELAXIN 800 MG  TABS (METAXALONE) two times a day HYCODAN 5-1.5 MG/5ML  SYRP (HYDROCODONE-HOMATROPINE) 1 tsp every 6 hours as needed cough        Vital Signs:  Patient Profile:   55 Years Old Female Weight:      131 pounds Temp:     97.6 degrees F oral BP sitting:   118 / 80  (left arm) Cuff size:   regular  Vitals Entered By: Alfred Levins, CMA (May 07, 2007 11:09 AM)                 Chief Complaint:  f/u on surgery.  History of Present Illness: On 03-31-07 had a left total knee replacement per Dr. Turner Daniels. Did well. Received 2 units of blood afterwards. Stayed at [facility] for rehab until she got home last week. Has PT at home through next week. Knees feel much better with less pain. However for past month has had pain and cracking in her neck. This causes frequent HA's in the back of her head. Has never been evaluated for osteoporosis. Takes a multvitamin daily.  Current Allergies: No known allergies   Past Medical History:    Reviewed history from 04/12/2007 and no changes required:       GOUTY ARTHROPATHY (ICD-274.0)       HYPERTENSION (ICD-401.9)       HYPERLIPIDEMIA (ICD-272.4)       FAMILY HISTORY DIABETES 1ST DEGREE RELATIVE (ICD-V18.0)       FAMILY HISTORY OF ALCOHOLISM/ADDICTION (ICD-V61.41)       MARFAN'S SYNDROME (ICD-759.82)       OSTEOARTHRITIS (ICD-715.90)       GERD (ICD-530.81)       Blindness       Internal Hemorrhoids       Hx of Alcholism( in remission)          Past Surgical History:    Reviewed history from 04/12/2007 and no changes required:       Cervical spine sx-1986 per Jeral Fruit       Let eye prosthesis       Total knee replacement right       Foot Surgery, bilateral- multiple operations       Neck Surgery     Review of Systems      See HPI   Physical Exam  General:     Well-developed,well-nourished,in no acute distress; alert,appropriate and cooperative throughout  examination. In a wheelchair. Msk:     neck is tender with spasm and crepitus. Very limited ROM    Impression & Recommendations:  Problem # 1:  OSTEOARTHRITIS (ICD-715.90)  Her updated medication list for this problem includes:    Aspir-low 81 Mg Tbec (Aspirin) .Marland Kitchen... 1 once daily    Darvocet-n 100 100-650 Mg Tabs (Propoxyphene n-apap) .Marland Kitchen... As needed    Ultram Er 100 Mg Tb24 (Tramadol hcl) .Marland Kitchen... 1 every 6 hours as needed   Problem # 2:  OSTEOPOROSIS (ICD-733.00)  Orders: Venipuncture (29562) TLB-CBC Platelet - w/Differential (85025-CBCD) T-Vitamin D (25-Hydroxy) (13086-57846)   Complete Medication List: 1)  Amitiza 24 Mcg Caps (Lubiprostone) .... Take 1 capsule by mouth twice a day 2)  Nexium 40 Mg Cpdr (Esomeprazole magnesium) .Marland Kitchen.. 1 once daily 3)  Clarinex 5 Mg Tabs (Desloratadine) .Marland Kitchen.. 1 once daily 4)  Cephalexin 250 Mg Caps (Cephalexin) .Marland Kitchen.. 1 once daily 5)  Aspir-low 81 Mg Tbec (Aspirin) .Marland Kitchen.. 1 once daily 6)  Colchicine 0.6 Mg Tabs (  Colchicine) .Marland Kitchen.. 1 by mouth two times a day 7)  Stool Softner  .... As needed 8)  Darvocet-n 100 100-650 Mg Tabs (Propoxyphene n-apap) .... As needed 9)  Diprolene Af 0.05 % Crea (Aug betamethasone dipropionate) .... As needed 10)  Ultram Er 100 Mg Tb24 (Tramadol hcl) .Marland Kitchen.. 1 every 6 hours as needed 11)  Prednisone 20 Mg Tabs (Prednisone) .... One a day 12)  Skelaxin 800 Mg Tabs (Metaxalone) .... Two times a day 13)  Hycodan 5-1.5 Mg/80ml Syrp (Hydrocodone-homatropine) .Marland Kitchen.. 1 tsp every 6 hours as needed cough  Other Orders: Radiology Referral (Radiology)   Patient Instructions: 1)  get labs and DEXA    Prescriptions: HYCODAN 5-1.5 MG/5ML  SYRP (HYDROCODONE-HOMATROPINE) 1 tsp every 6 hours as needed cough  #240 ml x 0   Entered and Authorized by:   Nelwyn Salisbury MD   Signed by:   Nelwyn Salisbury MD on 05/07/2007   Method used:   Print then Give to Patient   RxID:   [id number] SKELAXIN 800 MG  TABS (METAXALONE) two times a day   #60 x 11   Entered and Authorized by:   Nelwyn Salisbury MD   Signed by:   Nelwyn Salisbury MD on 05/07/2007   Method used:   Print then Give to Patient   RxID:   [id number]  ]

## 2010-07-25 NOTE — Assessment & Plan Note (Signed)
Summary: DISCUSS COLONOSCOPY    History of Present Illness Visit Type: follow up Primary GI MD: [name] MD [facility] Primary Provider: Shellia Carwin MD Chief Complaint: recall letter, still has pain in the right side with bowel movements History of Present Illness:   No change in GIsymptoms. Amitiza works, keeps bowels moving. Still gets R flank pain and gets relief with defecation and or passage of flatus. On prednisone for gout and eating mre and gaining weight.  She was in for a colonoscopy recall after negative exam 7/04 and brought in to review situation.             Updated Prior Medication List: AMITIZA 24 MCG CAPS (LUBIPROSTONE) Take 1 capsule by mouth twice a day NEXIUM 40 MG CPDR (ESOMEPRAZOLE MAGNESIUM) 1 once daily CLARINEX 5 MG  TABS (DESLORATADINE) 1 once daily CEPHALEXIN 250 MG  CAPS (CEPHALEXIN) 1 once daily ASPIR-LOW 81 MG TBEC (ASPIRIN) 1 once daily COLCHICINE 0.6 MG TABS (COLCHICINE) 1 by mouth two times a day * STOOL SOFTNER as needed DIPROLENE AF 0.05 %  CREA (AUG BETAMETHASONE DIPROPIONATE) as needed ULTRAM ER 100 MG  TB24 (TRAMADOL HCL) 1 every 6 hours as needed PREDNISONE 20 MG  TABS (PREDNISONE) one a day SKELAXIN 800 MG  TABS (METAXALONE) two times a day HYCODAN 5-1.5 MG/5ML  SYRP (HYDROCODONE-HOMATROPINE) 1 tsp every 6 hours as needed cough VITAMIN D 40102 UNIT  CAPS (ERGOCALCIFEROL) 1 by mouth once daily AMLODIPINE BESYLATE 10 MG  TABS (AMLODIPINE BESYLATE) once daily VICODIN 5-500 MG  TABS (HYDROCODONE-ACETAMINOPHEN) 1 every 6 hours as needed pain  Current Allergies (reviewed today): No known allergies   Past Medical History:    Reviewed history from 01/08/2008 and no changes required:       GOUT       HYPERTENSION        HYPERLIPIDEMIA       MARFAN'S SYNDROME        OSTEOARTHRITIS        GERD        Blindness       Internal Hemorrhoids       Hx of Alcholism( in remission)       Hepatic cyst        Diverticulosis       Chronic UTI    IBS-C  Past Surgical History:    Reviewed history from 05/07/2007 and no changes required:       Cervical spine sx-1986 per Jeral Fruit       Let eye prosthesis       Total knee replacement bilateral       Foot Surgery, bilateral- multiple operations       Neck Surgery, c-spine fracture       Hernia Surgery right femoral 2008   Family History:    Reviewed history from 01/07/2007 and no changes required:       Family History of Alcoholism/Addiction       Family History Diabetes 1st degree relative  Social History:    Reviewed history and no changes required:       Patient currently smokes.  1 pack per day       Alcohol Use - no       Illicit Drug Use - no       Patient does not get regular exercise.    Risk Factors:  Tobacco use:  current Drug use:  no Alcohol use:  no Exercise:  no    Vital Signs:  Patient Profile:  55 Years Old Female Height:     69 inches Weight:      154 pounds BMI:     22.82 Pulse rate:   84 / minute Pulse rhythm:   regular BP sitting:   150 / 82  (left arm)  Vitals Entered By: Chales Abrahams CMA (January 08, 2008 4:08 PM)                  Physical Exam  General:     Marfanoid features Abdomen:     soft, non-tender, no masses    Impression & Recommendations:  Problem # 1:  IRRITABLE BOWEL SYNDROME- CONSTIPATION PREDOMINANT (ICD-564.1) Assessment: Unchanged Continue current therapy.   Problem # 2:  SCREENING COLORECTAL-CANCER (ICD-V76.51) Assessment: Comment Only She had a colonoscopy in July of 2004 with no adenomatous polyps. What was removed was hyperplastic. At that time I had considered a repeat colonoscopy after she turns 50. However, current guidelines indicate that African Americans should have a colonoscopy at 64, which she essentially did. Thereafter, a routine colonoscopy every 10 years is warranted so will plan for one when she turns 55 in 02/2011. If she develops new problems with her GI tract that indicate colonic  difficulty (her symptoms are stable)will consider sooner endoscopic evaluation of the colon. She understands the plan.   Patient Instructions: 1)  Please schedule a follow-up appointment as needed.    Prescriptions: AMITIZA 24 MCG CAPS (LUBIPROSTONE) Take 1 capsule by mouth twice a day  #60 x 11   Entered and Authorized by:   Iva Boop MD   Signed by:   Iva Boop MD on 01/08/2008   Method used:   Faxed to ...       [facility] / [facility]       [address]       [place]       [place], [place]  [address]       Ph: [phone]       Fax: [phone]   RxID:   [id number]  ]

## 2010-07-25 NOTE — Progress Notes (Signed)
Summary: ? about med  Phone Note Call from Patient   Caller: Patient Call For: Nelwyn Salisbury MD Summary of Call: C/o heartburn- taking nexium. Is it ok to take Rolaids also? Initial call taken by: VM  Follow-up for Phone Call        Phone Call Completed Follow-up by: Raechel Ache, RN,  October 12, 2009 12:56 PM

## 2010-07-25 NOTE — Assessment & Plan Note (Signed)
Summary: f/u/cdw   Vital Signs:  Patient profile:   55 year old female Weight:      172 pounds Temp:     98.5 degrees F oral BP sitting:   142 / 100  (left arm) Cuff size:   regular  Vitals Entered By: Alfred Levins, CMA (September 13, 2008 3:19 PM) CC: f/u on leg   History of Present Illness: Here to follow up a cellulitis in the lower right leg which has been present for about 2 months. She had a mildly elevated WBC at 13.7 on 08-24-08. She had an MRI of the leg on 09-09-08 which showed a subcutaneous fluid collection in the lower right leg, but no signs of deeper infection or osteomyelitis. She had course of Keflex and Levaquin, and now she is on Doxycycline. She has never been febrile. The area is still slightly tender to her but feels a bit better.   Allergies: No Known Drug Allergies  Past History:  Past Medical History:    Reviewed history from 01/08/2008 and no changes required:    GOUT    HYPERTENSION     HYPERLIPIDEMIA    MARFAN'S SYNDROME     OSTEOARTHRITIS     GERD     Blindness    Internal Hemorrhoids    Hx of Alcholism( in remission)    Hepatic cyst     Diverticulosis    Chronic UTI    IBS-C  Past Surgical History:    Reviewed history from 07/22/2008 and no changes required:    Cervical spine sx-1986 per Jeral Fruit    Let eye prosthesis    Total knee replacement bilateral    Foot Surgery, bilateral- multiple operations, sees Dr. Raynald Kemp at [facility]    Neck Surgery, c-spine fracture    Hernia Surgery right femoral 2008  Review of Systems  The patient denies anorexia, fever, weight loss, weight gain, vision loss, decreased hearing, hoarseness, chest pain, syncope, dyspnea on exertion, peripheral edema, prolonged cough, headaches, hemoptysis, abdominal pain, melena, hematochezia, severe indigestion/heartburn, hematuria, incontinence, genital sores, muscle weakness, suspicious skin lesions, transient blindness, difficulty walking, depression, unusual weight  change, abnormal bleeding, enlarged lymph nodes, angioedema, breast masses, and testicular masses.    Physical Exam  General:  Well-developed,well-nourished,in no acute distress; alert,appropriate and cooperative throughout examination Extremities:  right lower leg is slightly swollen and warm, but less so than before. there is still an area of fluctuance on the lateral lower leg. No erythema.    Impression & Recommendations:  Problem # 1:  CELLULITIS AND ABSCESS OF LEG EXCEPT FOOT (ICD-682.6)  Her updated medication list for this problem includes:    Doxycycline Hyclate 100 Mg Caps (Doxycycline hyclate) .Marland Kitchen..Marland Kitchen Two times a day  Complete Medication List: 1)  Amitiza 24 Mcg Caps (Lubiprostone) .... Take 1 capsule by mouth twice a day 2)  Nexium 40 Mg Cpdr (Esomeprazole magnesium) .Marland Kitchen.. 1 once daily 3)  Clarinex 5 Mg Tabs (Desloratadine) .Marland Kitchen.. 1 once daily 4)  Aspir-low 81 Mg Tbec (Aspirin) .Marland Kitchen.. 1 once daily 5)  Colchicine 0.6 Mg Tabs (Colchicine) .Marland Kitchen.. 1 by mouth two times a day 6)  Stool Softner  .... As needed 7)  Diprolene Af 0.05 % Crea (Aug betamethasone dipropionate) .... As needed 8)  Ultram Er 100 Mg Tb24 (Tramadol hcl) .Marland Kitchen.. 1-2  every 6 hours as needed 9)  Prednisone 20 Mg Tabs (Prednisone) .... One a day 10)  Skelaxin 800 Mg Tabs (Metaxalone) .... Two times a day 11)  Hycodan 5-1.5 Mg/47ml Syrp (Hydrocodone-homatropine) .Marland Kitchen.. 1 tsp every 6 hours as needed cough 12)  Vitamin D [address] Unit Caps (Ergocalciferol) .Marland Kitchen.. 1 by mouth once daily 13)  Amlodipine Besylate 10 Mg Tabs (Amlodipine besylate) .... Once daily 14)  Vicodin 5-500 Mg Tabs (Hydrocodone-acetaminophen) .Marland Kitchen.. 1 every 6 hours as needed pain 15)  Epipen 2-pak 0.3 Mg/0.62ml (1:1000) Devi (Epinephrine hcl (anaphylaxis)) .... As needed 16)  Actonel 150 Mg Tabs (Risedronate sodium) .Marland Kitchen.. 1 tablet by mouth every month 17)  Allopurinol 100 Mg Tabs (Allopurinol) .Marland Kitchen.. 1 by mouth once daily 18)  Fexofenadine Hcl 180 Mg Tabs (Fexofenadine  hcl) .Marland Kitchen.. 1 by mouth once daily 19)  Doxycycline Hyclate 100 Mg Caps (Doxycycline hyclate) .... Two times a day  Patient Instructions: 1)  She will see Dr. Turner Daniels at 10:00 tomorrow am to evaluate. it seems likely that he could aspirate this fluid in the office.

## 2010-07-25 NOTE — Progress Notes (Signed)
Summary: BLOOD PRESSURE STILL RUNNING HIGH   Phone Note Call from Patient Call back at Home Phone [phone]   Caller: PT LIVE Call For: FRY Summary of Call: STILL HAVING PROBLEMS WITH HER BLOOD PRESSURE BOTTOM NUMBER STILL RUNNING 90 AND 100 EVERYDAY.  MAYBE SHE NEEDS TO UP HER MEDS.   Initial call taken by: Roselle Locus,  December 18, 2007 8:55 AM  Follow-up for Phone Call        increase Norvasc to two pills a day (for a total of 10 mg) and call me in one week Follow-up by: Nelwyn Salisbury MD,  December 18, 2007 9:45 AM  Additional Follow-up for Phone Call Additional follow up Details #1::        Pt aware Additional Follow-up by: Sid Falcon LPN,  December 18, 2007 11:01 AM

## 2010-07-25 NOTE — Progress Notes (Signed)
Summary: will you call in something cheaper   Phone Note Call from Patient Call back at Home Phone [phone]   Caller: Patient Call For: fry Summary of Call: [facility] [address] he needs a different cough syrup that medicaid will pay for  can you call in something else  the cough syrup you called in is $37.00 Initial call taken by: Roselle Locus,  May 08, 2007 1:27 PM  Follow-up for Phone Call        this is already generic, there is nothing cheaper Follow-up by: Nelwyn Salisbury MD,  May 09, 2007 8:39 AM  Additional Follow-up for Phone Call Additional follow up Details #1::        Phone Call Completed Additional Follow-up by: Alfred Levins, CMA,  May 09, 2007 12:58 PM

## 2010-07-25 NOTE — Progress Notes (Signed)
Summary: RF  Phone Note Call from Patient Call back at Home Phone [phone]   Caller: Patient-LIVE CALL Call For: DR Clent Ridges Reason for Call: Refill Medication Summary of Call: RF COLCHICINE AT [facility]. Initial call taken by: Warnell Forester,  January 01, 2008 2:34 PM  Follow-up for Phone Call        call in 0.6 mg two times a day , #60 with 11 rf Follow-up by: Nelwyn Salisbury MD,  January 02, 2008 9:59 AM  Additional Follow-up for Phone Call Additional follow up Details #1::        Phone Call Completed, Rx Called In Additional Follow-up by: Alfred Levins, CMA,  January 02, 2008 10:17 AM      Prescriptions: COLCHICINE 0.6 MG TABS (COLCHICINE) 1 by mouth two times a day  #60 x 2   Entered by:   Alfred Levins, CMA   Authorized by:   Nelwyn Salisbury MD   Signed by:   Alfred Levins, CMA on 01/02/2008   Method used:   Faxed to ...       [facility]adene Pierini / [facility]       [address]       [place], [place]  [address]       Ph: [phone]       Fax: [phone]   RxID:   [id number]

## 2010-07-25 NOTE — Progress Notes (Signed)
Summary: change of meds  Phone Note Call from Patient   Caller: Patient Call For: Dr. Kirtland Bouchard Summary of Call: Pt. wants Dr. Kirtland Bouchard to change her Colchicine as her Medicaid is not longer paying for it. [phone] Initial call taken by: Lynann Beaver CMA,  April 02, 2008 4:44 PM    This is a generic and there are no substitutes  Appended Document: change of meds Pt notified.

## 2010-07-25 NOTE — Progress Notes (Signed)
Summary: advice  Phone Note Call from Patient Call back at Home Phone [phone]   Caller: Patient Call For: Nelwyn Salisbury MD Summary of Call: went to ER last week for leg hematoma, was opened and packed, then went back to have packing removed. Is supposed to apply warm compresses 6x daily. She can't see to do it and doesn't have help. Any suggestions? Initial call taken by: Raechel Ache, RN,  Nov 07, 2009 8:59 AM  Follow-up for Phone Call        once a day is probably enough. Do what you can, and follow up as needed  Follow-up by: Nelwyn Salisbury MD,  Nov 07, 2009 10:32 AM  Additional Follow-up for Phone Call Additional follow up Details #1::        Phone Call Completed Additional Follow-up by: Raechel Ache, RN,  Nov 07, 2009 12:04 PM

## 2010-07-25 NOTE — Assessment & Plan Note (Signed)
Summary: discuss meds/cjr   Vital Signs:  Patient profile:   55 year old female Weight:      172 pounds Temp:     98.6 degrees F oral BP sitting:   148 / 104  (left arm) Cuff size:   regular  Vitals Entered By: Alfred Levins, CMA (June 07, 2009 2:33 PM) CC: discuss prednisone   History of Present Illness: Here to discuss her long term use of Prednisone, at the advice of Dr. Leone Payor. Indeed she has gained some weight over the past 2 years, but she has only gained 2 lbs since her last visit here in July. Her rheumatoid arthritis has been fairly stable, although the recent cold weather has caused increased stiffness and pain. She has been on 20 mg a day of Prednisone for several years now, and she takes Ultram and Vicodin as well. She averages 2-4 Vicodins a day. Her BP is stable at home. She has not had her meds yet today.  Current Medications (verified): 1)  Amitiza 24 Mcg Caps (Lubiprostone) .... Take 1 Capsule By Mouth Twice A Day 2)  Nexium 40 Mg Cpdr (Esomeprazole Magnesium) .Marland Kitchen.. 1 Once Daily 3)  Allegra 180 Mg Tabs (Fexofenadine Hcl) .... Once Daily 4)  Colchicine 0.6 Mg Tabs (Colchicine) .Marland Kitchen.. 1 By Mouth Two Times A Day 5)  Stool Softner .... As Needed 6)  Diprolene Af 0.05 %  Crea (Aug Betamethasone Dipropionate) .... As Needed 7)  Ultram Er 100 Mg  Tb24 (Tramadol Hcl) .Marland Kitchen.. 1-2  Every 6 Hours As Needed 8)  Prednisone 20 Mg  Tabs (Prednisone) .... One A Day 9)  Skelaxin 800 Mg  Tabs (Metaxalone) .... Two Times A Day 10)  Vitamin D 60454 Unit  Caps (Ergocalciferol) .Marland Kitchen.. 1 By Mouth Once Daily 11)  Amlodipine Besylate 10 Mg  Tabs (Amlodipine Besylate) .... Once Daily 12)  Vicodin 5-500 Mg  Tabs (Hydrocodone-Acetaminophen) .Marland Kitchen.. 1 Every 6 Hours As Needed Pain 13)  Epipen 2-Pak 0.3 Mg/0.17ml (1:1000) Devi (Epinephrine Hcl (Anaphylaxis)) .... As Needed 14)  Actonel 150 Mg  Tabs (Risedronate Sodium) .Marland Kitchen.. 1 Tablet By Mouth Every Month 15)  Allopurinol 100 Mg Tabs (Allopurinol) .Marland Kitchen.. 1 By  Mouth Once Daily  Allergies (verified): No Known Drug Allergies  Past History:  Past Medical History: GOUT HYPERTENSION  HYPERLIPIDEMIA MARFAN'S SYNDROME  GERD  Blindness Internal Hemorrhoids Hx of Alcholism( in remission) Hepatic cyst  Diverticulosis Chronic UTI IBS-C Allergic reactions/? angioedema Rheumatoid arthritis  Review of Systems  The patient denies anorexia, fever, weight loss, weight gain, vision loss, decreased hearing, hoarseness, chest pain, syncope, dyspnea on exertion, peripheral edema, prolonged cough, headaches, hemoptysis, abdominal pain, melena, hematochezia, severe indigestion/heartburn, hematuria, incontinence, genital sores, muscle weakness, suspicious skin lesions, transient blindness, difficulty walking, depression, unusual weight change, abnormal bleeding, enlarged lymph nodes, angioedema, breast masses, and testicular masses.    Physical Exam  General:  at her baseline, walks with a cane Neck:  No deformities, masses, or tenderness noted. Lungs:  Normal respiratory effort, chest expands symmetrically. Lungs are clear to auscultation, no crackles or wheezes. Heart:  Normal rate and regular rhythm. S1 and S2 normal without gallop, murmur, click, rub or other extra sounds.   Impression & Recommendations:  Problem # 1:  RHEUMATOID ARTHRITIS (ICD-714.0)  Her updated medication list for this problem includes:    Prednisone 20 Mg Tabs (Prednisone) .Marland Kitchen... 1/2 a tab once daily  Problem # 2:  HYPERTENSION (ICD-401.9)  Her updated medication list for this problem includes:  Amlodipine Besylate 10 Mg Tabs (Amlodipine besylate) ..... Once daily  Complete Medication List: 1)  Amitiza 24 Mcg Caps (Lubiprostone) .... Take 1 capsule by mouth twice a day 2)  Nexium 40 Mg Cpdr (Esomeprazole magnesium) .Marland Kitchen.. 1 once daily 3)  Allegra 180 Mg Tabs (Fexofenadine hcl) .... Once daily 4)  Colchicine 0.6 Mg Tabs (Colchicine) .Marland Kitchen.. 1 by mouth two times a day 5)   Stool Softner  .... As needed 6)  Diprolene Af 0.05 % Crea (Aug betamethasone dipropionate) .... As needed 7)  Ultram Er 100 Mg Tb24 (Tramadol hcl) .Marland Kitchen.. 1-2  every 6 hours as needed 8)  Prednisone 20 Mg Tabs (Prednisone) .... 1/2 a tab once daily 9)  Skelaxin 800 Mg Tabs (Metaxalone) .... Two times a day 10)  Vitamin D 52841 Unit Caps (Ergocalciferol) .Marland Kitchen.. 1 by mouth once daily 11)  Amlodipine Besylate 10 Mg Tabs (Amlodipine besylate) .... Once daily 12)  Vicodin 5-500 Mg Tabs (Hydrocodone-acetaminophen) .Marland Kitchen.. 1 every 6 hours as needed pain 13)  Epipen 2-pak 0.3 Mg/0.56ml (1:1000) Devi (Epinephrine hcl (anaphylaxis)) .... As needed 14)  Actonel 150 Mg Tabs (Risedronate sodium) .Marland Kitchen.. 1 tablet by mouth every month 15)  Allopurinol 100 Mg Tabs (Allopurinol) .Marland Kitchen.. 1 by mouth once daily  Patient Instructions: 1)  The prednisone has made a huge difference in her arthritic pains and her mobility. Due to side effects, however, we will cut her daily dose to 10 mg for a few weeks. She will call me in 2-3 weeks with an update.

## 2010-07-25 NOTE — Progress Notes (Signed)
Summary: rx cyclobenzaprine   Phone Note From Pharmacy   Caller: [facility] Summary of Call: rx cuclobenzaprine  10mg   #270 Initial call taken by: Pura Spice, RN,  May 05, 2010 11:49 AM  Follow-up for Phone Call        call this in, take three times a day as needed for spasm, with 3 rf Follow-up by: Nelwyn Salisbury MD,  May 05, 2010 2:34 PM  Additional Follow-up for Phone Call Additional follow up Details #1::        done  Additional Follow-up by: Pura Spice, RN,  May 05, 2010 2:55 PM    New/Updated Medications: CYCLOBENZAPRINE HCL 10 MG TABS (CYCLOBENZAPRINE HCL) 1 by mouth three times a day as needed spasms Prescriptions: CYCLOBENZAPRINE HCL 10 MG TABS (CYCLOBENZAPRINE HCL) 1 by mouth three times a day as needed spasms  #270 x 3   Entered by:   Pura Spice, RN   Authorized by:   Nelwyn Salisbury MD   Signed by:   Pura Spice, RN on 05/05/2010   Method used:   Telephoned to ...       [facility] ([facility])       [address]       [place], [place]  [address]       Ph: [phone]       Fax: [phone]   RxID:   [id number]

## 2010-07-25 NOTE — Progress Notes (Signed)
Summary: AMITIZA REFILL   Phone Note Call from Patient Call back at Home Phone [phone]   Call For: DR GESSNER Reason for Call: Talk to Nurse Summary of Call: NEEDS AMITIZA REFILLED. WAS TOLD SHE NEEDED ROV FIRST WHICH IS SCHEDULED FOR 7-13. BUT SHE IS ALL OUT OF MEDICINE. [facility] DOES NOT NEED A C-B UNLESS WE ARE NOT REFILLING TODAY. Initial call taken by: Leanor Kail [facility],  December 17, 2007 11:16 AM      Prescriptions: AMITIZA 24 MCG CAPS (LUBIPROSTONE) Take 1 capsule by mouth twice a day  #60 x 0   Entered by:   Francee Piccolo CMA   Authorized by:   Iva Boop MD   Signed by:   Francee Piccolo CMA on 12/17/2007   Method used:   Faxed to ...       [facility]adene Pierini / [facility]       [address]       [place]       [place], [place]  [address]       Ph: [phone]       Fax: [phone]   RxID:   [id number]

## 2010-07-25 NOTE — Progress Notes (Signed)
Summary: Req for Refill on Med  Phone Note Call from Patient   Caller: Patient @ [phone] Reason for Call: Refill Medication Summary of Call: Pt called to adv that [facility] has not received the refill RX for her med (Colchicine 0.6 Mg Tabs)....... Pt inquiring about same. Initial call taken by: Debbra Riding,  June 08, 2009 4:52 PM  Follow-up for Phone Call        we were informed that Colchicine is no longer available. She can use Vicodin as needed . if she is out, we can call her in some more Follow-up by: Nelwyn Salisbury MD,  June 10, 2009 8:46 AM  Additional Follow-up for Phone Call Additional follow up Details #1::        l/m on pt's cell with directions Additional Follow-up by: Alfred Levins, CMA,  June 10, 2009 11:00 AM     Appended Document: Req for Refill on Med pt is taking allpurinol instead

## 2010-07-25 NOTE — Assessment & Plan Note (Signed)
Summary: feet swelling/fatigue/loss of appetite/cjr   Vital Signs:  Patient profile:   55 year old female Weight:      163 pounds Temp:     98.7 degrees F BP sitting:   112 / 80  (left arm) Cuff size:   regular  Vitals Entered By: Duard Brady LPN (September 26, 1608 3:16 PM) CC: c/o both feet swelling, decrease appetite, dry mouth - mixed up meds - took allopurninol instead for prednisone for several days Is Patient Diabetic? No   History of Present Illness: Here with her friend to discuss the onset of swelling in both feet about 2 weeks ago. No pain or SOB. She discovered last night that for the past month she has been taking 2 Allopurinol tablets a day and no Prednisone, rather than 2 Prednisone tabs a day. The generic drug manufacturer had switched, and the new pills were of a similar size and shape so she got them confused. She also states that she has been thirsty witha dry mouth for several weeks.   Preventive Screening-Counseling & Management  Alcohol-Tobacco     Smoking Status: current  Allergies (verified): No Known Drug Allergies  Past History:  Past Medical History: Reviewed history from 06/07/2009 and no changes required. GOUT HYPERTENSION  HYPERLIPIDEMIA MARFAN'S SYNDROME  GERD  Blindness Internal Hemorrhoids Hx of Alcholism( in remission) Hepatic cyst  Diverticulosis Chronic UTI IBS-C Allergic reactions/? angioedema Rheumatoid arthritis  Past Surgical History: Reviewed history from 07/22/2008 and no changes required. Cervical spine sx-1986 per Jeral Fruit Let eye prosthesis Total knee replacement bilateral Foot Surgery, bilateral- multiple operations, sees Dr. Raynald Kemp at [facility] Neck Surgery, c-spine fracture Hernia Surgery right femoral 2008  Review of Systems  The patient denies anorexia, fever, weight loss, weight gain, vision loss, decreased hearing, hoarseness, chest pain, syncope, dyspnea on exertion, prolonged cough, headaches,  hemoptysis, abdominal pain, melena, hematochezia, severe indigestion/heartburn, hematuria, incontinence, genital sores, muscle weakness, suspicious skin lesions, transient blindness, difficulty walking, depression, unusual weight change, abnormal bleeding, enlarged lymph nodes, angioedema, breast masses, and testicular masses.    Physical Exam  General:  Well-developed,well-nourished,in no acute distress; alert,appropriate and cooperative throughout examination Neck:  No deformities, masses, or tenderness noted. Lungs:  Normal respiratory effort, chest expands symmetrically. Lungs are clear to auscultation, no crackles or wheezes. Heart:  Normal rate and regular rhythm. S1 and S2 normal without gallop, murmur, click, rub or other extra sounds. Pulses:  R and L carotid,radial,femoral,dorsalis pedis and posterior tibial pulses are full and equal bilaterally Extremities:  2+ left pedal edema and 1+ right pedal edema.     Impression & Recommendations:  Problem # 1:  LEG EDEMA (ICD-782.3)  Orders: Venipuncture (96045) TLB-BMP (Basic Metabolic Panel-BMET) (80048-METABOL) TLB-CBC Platelet - w/Differential (85025-CBCD) TLB-Hepatic/Liver Function Pnl (80076-HEPATIC) TLB-TSH (Thyroid Stimulating Hormone) (84443-TSH) TLB-A1C / Hgb A1C (Glycohemoglobin) (83036-A1C)  Problem # 2:  RHEUMATOID ARTHRITIS (ICD-714.0)  Her updated medication list for this problem includes:    Prednisone 10 Mg Tabs (Prednisone) .Marland Kitchen... 2 by mouth once daily  Problem # 3:  HYPERTENSION (ICD-401.9)  Her updated medication list for this problem includes:    Amlodipine Besylate 10 Mg Tabs (Amlodipine besylate) ..... Once daily  Complete Medication List: 1)  Amitiza 24 Mcg Caps (Lubiprostone) .... Take 1 capsule by mouth twice a day 2)  Nexium 40 Mg Cpdr (Esomeprazole magnesium) .Marland Kitchen.. 1 once daily 3)  Allegra 180 Mg Tabs (Fexofenadine hcl) .... Once daily 4)  Colchicine 0.6 Mg Tabs (Colchicine) .Marland Kitchen.. 1 by  mouth two times a  day 5)  Stool Softner  .... As needed 6)  Diprolene Af 0.05 % Crea (Aug betamethasone dipropionate) .... As needed 7)  Tramadol Hcl 50 Mg Tabs (Tramadol hcl) .Marland Kitchen.. 1-2 by mouth every 6 hours as needed for pain 8)  Prednisone 10 Mg Tabs (Prednisone) .... 2 by mouth once daily 9)  Skelaxin 800 Mg Tabs (Metaxalone) .... Two times a day 10)  Vitamin D 64403 Unit Caps (Ergocalciferol) .Marland Kitchen.. 1 by mouth once daily 11)  Amlodipine Besylate 10 Mg Tabs (Amlodipine besylate) .... Once daily 12)  Vicodin 5-500 Mg Tabs (Hydrocodone-acetaminophen) .Marland Kitchen.. 1 every 6 hours as needed pain 13)  Epipen 2-pak 0.3 Mg/0.87ml (1:1000) Devi (Epinephrine hcl (anaphylaxis)) .... As needed 14)  Actonel 150 Mg Tabs (Risedronate sodium) .Marland Kitchen.. 1 tablet by mouth every month 15)  Allopurinol 100 Mg Tabs (Allopurinol) .Marland Kitchen.. 1 by mouth once daily  Patient Instructions: 1)  Part of this could be due to her medication mix up. get back on Prednisone and Allopurinol as before. Check labs today

## 2010-07-25 NOTE — Assessment & Plan Note (Signed)
Summary: leg is purple/cdw   Vital Signs:  Patient Profile:   55 Years Old Female Height:     69 inches Temp:     98.2 degrees F oral BP sitting:   104 / 80  Vitals Entered By: Lynann Beaver CMA (July 22, 2008 10:07 AM)                 PCP:  Shellia Carwin MD  Chief Complaint:  discolored foot and leg.  History of Present Illness: On 07-08-08 Dr. Raynald Kemp, her podiatrist, did  surgery on the 4th right toe and the great left toe. She was then told to stay off her feet and keep her feet elevated for one week. Over this time her lower legs got warm and tender and devloped a purple color. She had venous dopplers on 07-14-08 of both lower legs which were negative for DVT. Now her legs feel a little better, and her friend says the color is returning to normal. No fever or chest pain or SOB.    Current Allergies (reviewed today): No known allergies   Past Medical History:    Reviewed history from 01/08/2008 and no changes required:       GOUT       HYPERTENSION        HYPERLIPIDEMIA       MARFAN'S SYNDROME        OSTEOARTHRITIS        GERD        Blindness       Internal Hemorrhoids       Hx of Alcholism( in remission)       Hepatic cyst        Diverticulosis       Chronic UTI       IBS-C  Past Surgical History:    Reviewed history from 01/08/2008 and no changes required:       Cervical spine sx-1986 per Jeral Fruit       Let eye prosthesis       Total knee replacement bilateral       Foot Surgery, bilateral- multiple operations, sees Dr. Raynald Kemp at [facility]       Neck Surgery, c-spine fracture       Hernia Surgery right femoral 2008     Review of Systems  The patient denies anorexia, fever, weight loss, weight gain, vision loss, decreased hearing, hoarseness, chest pain, syncope, dyspnea on exertion, peripheral edema, prolonged cough, headaches, hemoptysis, abdominal pain, melena, hematochezia, severe indigestion/heartburn, hematuria, incontinence, genital  sores, muscle weakness, suspicious skin lesions, transient blindness, difficulty walking, depression, unusual weight change, abnormal bleeding, enlarged lymph nodes, angioedema, breast masses, and testicular masses.     Physical Exam  General:     Well-developed,well-nourished,in no acute distress; alert,appropriate and cooperative throughout examination Pulses:     R and L carotid,radial,femoral,dorsalis pedis and posterior tibial pulses are full and equal bilaterally Extremities:     No clubbing, cyanosis, edema, or deformity noted with normal full range of motion of all joints.   Skin:     right lower leg is red, slightly tender, and warm. Not swollen, no cords.     Impression & Recommendations:  Problem # 1:  CELLULITIS AND ABSCESS OF LEG EXCEPT FOOT (ICD-682.6)  Her updated medication list for this problem includes:    Cephalexin 250 Mg Caps (Cephalexin) .Marland Kitchen... 1 once daily    Keflex 500 Mg Caps (Cephalexin) .Marland Kitchen... Three times a day   Complete Medication List: 1)  Amitiza 24 Mcg Caps (Lubiprostone) .... Take 1 capsule by mouth twice a day 2)  Nexium 40 Mg Cpdr (Esomeprazole magnesium) .Marland Kitchen.. 1 once daily 3)  Clarinex 5 Mg Tabs (Desloratadine) .Marland Kitchen.. 1 once daily 4)  Cephalexin 250 Mg Caps (Cephalexin) .Marland Kitchen.. 1 once daily 5)  Aspir-low 81 Mg Tbec (Aspirin) .Marland Kitchen.. 1 once daily 6)  Colchicine 0.6 Mg Tabs (Colchicine) .Marland Kitchen.. 1 by mouth two times a day 7)  Stool Softner  .... As needed 8)  Diprolene Af 0.05 % Crea (Aug betamethasone dipropionate) .... As needed 9)  Ultram Er 100 Mg Tb24 (Tramadol hcl) .Marland Kitchen.. 1-2  every 6 hours as needed 10)  Prednisone 20 Mg Tabs (Prednisone) .... One a day 11)  Skelaxin 800 Mg Tabs (Metaxalone) .... Two times a day 12)  Hycodan 5-1.5 Mg/31ml Syrp (Hydrocodone-homatropine) .Marland Kitchen.. 1 tsp every 6 hours as needed cough 13)  Vitamin D 96295 Unit Caps (Ergocalciferol) .Marland Kitchen.. 1 by mouth once daily 14)  Amlodipine Besylate 10 Mg Tabs (Amlodipine besylate) .... Once  daily 15)  Vicodin 5-500 Mg Tabs (Hydrocodone-acetaminophen) .Marland Kitchen.. 1 every 6 hours as needed pain 16)  Epipen 2-pak 0.3 Mg/0.41ml (1:1000) Devi (Epinephrine hcl (anaphylaxis)) .... As needed 17)  Actonel 150 Mg Tabs (Risedronate sodium) .Marland Kitchen.. 1 tablet by mouth every month 18)  Allopurinol 100 Mg Tabs (Allopurinol) .Marland Kitchen.. 1 by mouth once daily 19)  Fexofenadine Hcl 180 Mg Tabs (Fexofenadine hcl) .Marland Kitchen.. 1 by mouth once daily 20)  Keflex 500 Mg Caps (Cephalexin) .... Three times a day   Patient Instructions: 1)  Please schedule a follow-up appointment as needed.   Prescriptions: ULTRAM ER 100 MG  TB24 (TRAMADOL HCL) 1-2  every 6 hours as needed  #120 x 5   Entered and Authorized by:   Nelwyn Salisbury MD   Signed by:   Nelwyn Salisbury MD on 07/22/2008   Method used:   Print then Give to Patient   RxID:   [id number] KEFLEX 500 MG CAPS (CEPHALEXIN) three times a day  #21 x 0   Entered and Authorized by:   Nelwyn Salisbury MD   Signed by:   Nelwyn Salisbury MD on 07/22/2008   Method used:   Print then Give to Patient   RxID:   [id number]

## 2010-07-25 NOTE — Procedures (Signed)
Summary: EGD   EGD  Procedure date:  04/16/2003  Findings:      Location: [facility]   Patient Name: Edelin, Fryer MRN:  Procedure Procedures: Panendoscopy (EGD) CPT: 43235.  Personnel: Endoscopist: Iva Boop, MD, [facility].  Referred By: Gershon Crane, MD.  Exam Location: Exam performed in Outpatient Clinic. Outpatient  Patient Consent: Procedure, Alternatives, Risks and Benefits discussed, consent obtained, from patient. Consent was obtained by the RN.  Indications Symptoms: Weight loss. Nausea. Abdominal pain,  History  Current Medications: Patient is not currently taking Coumadin.  Pre-Exam Physical: Performed Apr 16, 2003  Cardio-pulmonary exam, HEENT exam, Abdominal exam, Mental status exam WNL.  Exam Exam Info: Maximum depth of insertion Duodenum, intended Duodenum. Patient position: on left side. Vocal cords not visualized. Gastric retroflexion performed. Images taken. ASA Classification: III. Tolerance: good.  Sedation Meds: Patient assessed and found to be appropriate for moderate (conscious) sedation. Sedation was managed by the Endoscopist. Fentanyl 50 mcg. given IV. Versed 3 mg. given IV. Cetacaine Spray 2 sprays given aerosolized.  Monitoring: BP and pulse monitoring done. Oximetry used. Supplemental O2 given  Findings - Normal: Proximal Esophagus to Duodenal 2nd Portion.   Assessment Normal examination. Events  Unplanned Intervention: No unplanned interventions were required.  Plans Disposition: After procedure patient sent to recovery. After recovery patient sent home. Comments: Will arrange CT angiography to look for mesenteric ischemia. We will call her with appointment.   CC:   Gershon Crane, MD

## 2010-07-25 NOTE — Assessment & Plan Note (Signed)
Summary: leg swelling,warm,red/jls   Vital Signs:  Patient Profile:   55 Years Old Female Height:     69 inches Weight:      174 pounds O2 Sat:      98 % O2 treatment:    Room Air Temp:     99.7 degrees F oral Pulse rate:   92 / minute Pulse rhythm:   regular BP sitting:   140 / 94  (left arm) Cuff size:   regular  Vitals Entered By: Raechel Ache, RN (August 05, 2008 1:54 PM)                 PCP:  Shellia Carwin MD  Chief Complaint:  C/o R leg red, sore, and swelling.Marland Kitchen  History of Present Illness: 55 year old, black female, who is status post left-sided bunionectomy and also had the arthroplasty involving her right fourth digit.  On January 19.  She is seen in follow-up by her podiatrist, who ordered a lower extremity venous doffer evaluation due to soft tissue swelling about the midcalf area on the anterolateral aspect.  This was negative for abnormal findings.  The patient was started on Keflex on January 28 and has had the persistent inflammatory change involving her right anterolateral lower leg.  Denies any fever or chills. She has treated hypertension, which has been stable    Current Allergies: No known allergies       Physical Exam  General:     alert and well-developed.   Extremities:     there was an approximate 8-cm area of swelling, erythema, and slight tenderness involving her right anterolateral calf area.  The area of the erythema extended to almost to the level of the ankle.  There is slight edema of the right ankle and foot    Impression & Recommendations:  Problem # 1:  CELLULITIS AND ABSCESS OF LEG EXCEPT FOOT (ICD-682.6)  The following medications were removed from the medication list:    Cephalexin 250 Mg Caps (Cephalexin) .Marland Kitchen... 1 once daily  Her updated medication list for this problem includes:    Levaquin 500 Mg Tabs (Levofloxacin) ..... One daily for 10 days   Problem # 2:  HYPERTENSION (ICD-401.9)  Her updated medication list  for this problem includes:    Amlodipine Besylate 10 Mg Tabs (Amlodipine besylate) ..... Once daily   Complete Medication List: 1)  Amitiza 24 Mcg Caps (Lubiprostone) .... Take 1 capsule by mouth twice a day 2)  Nexium 40 Mg Cpdr (Esomeprazole magnesium) .Marland Kitchen.. 1 once daily 3)  Clarinex 5 Mg Tabs (Desloratadine) .Marland Kitchen.. 1 once daily 4)  Aspir-low 81 Mg Tbec (Aspirin) .Marland Kitchen.. 1 once daily 5)  Colchicine 0.6 Mg Tabs (Colchicine) .Marland Kitchen.. 1 by mouth two times a day 6)  Stool Softner  .... As needed 7)  Diprolene Af 0.05 % Crea (Aug betamethasone dipropionate) .... As needed 8)  Ultram Er 100 Mg Tb24 (Tramadol hcl) .Marland Kitchen.. 1-2  every 6 hours as needed 9)  Prednisone 20 Mg Tabs (Prednisone) .... One a day 10)  Skelaxin 800 Mg Tabs (Metaxalone) .... Two times a day 11)  Hycodan 5-1.5 Mg/60ml Syrp (Hydrocodone-homatropine) .Marland Kitchen.. 1 tsp every 6 hours as needed cough 12)  Vitamin D 57846 Unit Caps (Ergocalciferol) .Marland Kitchen.. 1 by mouth once daily 13)  Amlodipine Besylate 10 Mg Tabs (Amlodipine besylate) .... Once daily 14)  Vicodin 5-500 Mg Tabs (Hydrocodone-acetaminophen) .Marland Kitchen.. 1 every 6 hours as needed pain 15)  Epipen 2-pak 0.3 Mg/0.46ml (1:1000) Devi (Epinephrine hcl (anaphylaxis)) .Marland KitchenMarland KitchenMarland Kitchen  As needed 16)  Actonel 150 Mg Tabs (Risedronate sodium) .Marland Kitchen.. 1 tablet by mouth every month 17)  Allopurinol 100 Mg Tabs (Allopurinol) .Marland Kitchen.. 1 by mouth once daily 18)  Fexofenadine Hcl 180 Mg Tabs (Fexofenadine hcl) .Marland Kitchen.. 1 by mouth once daily 19)  Levaquin 500 Mg Tabs (Levofloxacin) .... One daily for 10 days   Patient Instructions: 1)  keep right leg elevated as much as possible 2)  Limit your Sodium (Salt). 3)  Take your antibiotic as prescribed until ALL of it is gone, but stop if you develop a rash or swelling and contact our office as soon as possible. 4)  return to the office if you  develope  any fever, chills, or worsening swelling, redness, or pain involving the right lower leg 5)  follow-up Dr. Clent Ridges  10  days   Prescriptions: LEVAQUIN 500 MG TABS (LEVOFLOXACIN) one daily for 10 days  #10 x 0   Entered and Authorized by:   Gordy Savers  MD   Signed by:   Gordy Savers  MD on 08/05/2008   Method used:   Faxed to ...       [facility] (retail)       [address]       [place], [place]  [address]       Ph: [phone]       Fax: [phone]   RxID:   [id number]

## 2010-07-25 NOTE — Progress Notes (Signed)
Summary: RF  Phone Note Call from Patient Call back at Home Phone [phone]   Caller: Patient-LIVE CALL Call For: DR Clent Ridges Reason for Call: Refill Medication Summary of Call: RF CLARINEX AT [facility]. Initial call taken by: Warnell Forester,  February 17, 2008 12:47 PM  Follow-up for Phone Call        Phone Call Completed, Rx Called In Follow-up by: Alfred Levins, CMA,  February 17, 2008 4:35 PM      Prescriptions: CLARINEX 5 MG  TABS (DESLORATADINE) 1 once daily  #30 x 11   Entered by:   Alfred Levins, CMA   Authorized by:   Nelwyn Salisbury MD   Signed by:   Alfred Levins, CMA on 02/17/2008   Method used:   Faxed to ...       [facility] (retail)       [address]       [place], [place]  [address]       Ph: [phone]       Fax: [phone]   RxID:   [id number]

## 2010-07-25 NOTE — Progress Notes (Signed)
Summary: Rx  Phone Note Refill Request   Refills Requested: Medication #1:  NEXIUM 40 MG CPDR 1 once daily   Brand Name Necessary? Yes   Last Refilled: 06/07/2009  Medication #2:  VICODIN 5-500 MG  TABS 1 every 6 hours as needed pain   Brand Name Necessary? No   Last Refilled: 05/30/2009  Medication #3:  ALLEGRA 180 MG TABS once daily   Brand Name Necessary? No   Last Refilled: 06/07/2009 [facility] phone----[phone]    Fax---[phone]  Initial call taken by: Warnell Forester,  June 30, 2009 10:11 AM  Follow-up for Phone Call        call in Nexium #30 with 11 rf, Allegra #30 with 11 rf, and Vicodin #120 with 5 rf Follow-up by: Nelwyn Salisbury MD,  June 30, 2009 12:41 PM  Additional Follow-up for Phone Call Additional follow up Details #1::        Rx called to pharmacy Additional Follow-up by: Alfred Levins, CMA,  July 01, 2009 8:56 AM    Prescriptions: VICODIN 5-500 MG  TABS (HYDROCODONE-ACETAMINOPHEN) 1 every 6 hours as needed pain  #120 x 5   Entered by:   Alfred Levins, CMA   Authorized by:   Nelwyn Salisbury MD   Signed by:   Alfred Levins, CMA on 07/01/2009   Method used:   Telephoned to ...       [facility] (retail)       [address]       [place], [place]  [address]       Ph: [phone]       Fax: [phone]   RxID:   [id number] ALLEGRA 180 MG TABS (FEXOFENADINE HCL) once daily  #30 x 11   Entered by:   Alfred Levins, CMA   Authorized by:   Nelwyn Salisbury MD   Signed by:   Alfred Levins, CMA on 07/01/2009   Method used:   Telephoned to ...       [facility] (retail)       [address]       [place], [place]  [address]       Ph: [phone]       Fax: [phone]   RxID:   [id number] NEXIUM 40 MG CPDR (ESOMEPRAZOLE MAGNESIUM) 1 once daily  #30 x 11   Entered by:   Alfred Levins, CMA   Authorized by:   Nelwyn Salisbury MD   Signed by:   Alfred Levins, CMA on 07/01/2009   Method  used:   Telephoned to ...       [facility] (retail)       [address]       [place], [place]  [address]       Ph: [phone]       Fax: [phone]   RxID:   [id number]

## 2010-07-25 NOTE — Progress Notes (Signed)
Summary: refills and ear drops  Phone Note Call from Patient   Caller: Patient Call For: Nelwyn Salisbury MD Reason for Call: Acute Illness Summary of Call: [facility] is waiting for refills that have already been sent to [place]. Ear drops are not helping and would like a different RX.  Ciprodex does not stop the draining. Initial call taken by: [facility] CMA AAMA,  June 02, 2010 2:06 PM  Follow-up for Phone Call        cortisporin otic gtts as previously prescribed but needs ov and exam if no resolution or worsening. Follow-up by: Edwyna Perfect MD,  June 02, 2010 4:26 PM  Additional Follow-up for Phone Call Additional follow up Details #1::        Pt aware and we have already faxed over other refills. Additional Follow-up by: Romualdo Bolk, CMA (AAMA),  June 02, 2010 4:34 PM    New/Updated Medications: CORTISPORIN 3.5-10000-1 SOLN (NEOMYCIN-POLYMYXIN-HC) apply 5 drops in ear 4 times a day as needed Prescriptions: CORTISPORIN 3.5-10000-1 SOLN (NEOMYCIN-POLYMYXIN-HC) apply 5 drops in ear 4 times a day as needed  #1 x 0   Entered by:   Romualdo Bolk, CMA (AAMA)   Authorized by:   Edwyna Perfect MD   Signed by:   Romualdo Bolk, CMA (AAMA) on 06/02/2010   Method used:   Faxed to ...       [facility] (retail)       [address]       [place], [place]  [address]       Ph: [phone]       Fax: [phone]   RxID:   [id number]

## 2010-07-25 NOTE — Progress Notes (Signed)
Summary: Norvasc increase request  Phone Note Call from Patient Call back at Home Phone [phone]   Caller: Patient Call For: Clent Ridges Summary of Call: Pt calling to report [facility] got an RX for 5 mg Norvasc #30 yesterday.  Pt was asked to take 10 mg on 6/25 and monitor her BP due to high numbers.  Her current BP taking two 5 mg tabs and her BP has been running 120/85 and 130/88, so she feels she does need the 10 mg daily. Please contact [facility] to increase to 10 mg if Dr Clent Ridges approves. Initial call taken by: Sid Falcon LPN,  December 31, 7827 11:16 AM  Follow-up for Phone Call        call in 10 mg Norvasc once daily , #30 with 11 rf Follow-up by: Nelwyn Salisbury MD,  December 31, 2007 6:27 PM  Additional Follow-up for Phone Call Additional follow up Details #1::        Phone Call Completed, Rx Called In Additional Follow-up by: Alfred Levins, CMA,  January 01, 2008 8:14 AM    New/Updated Medications: AMLODIPINE BESYLATE 10 MG  TABS (AMLODIPINE BESYLATE) once daily   Prescriptions: AMLODIPINE BESYLATE 10 MG  TABS (AMLODIPINE BESYLATE) once daily  #30 x 11   Entered by:   Alfred Levins, CMA   Authorized by:   Nelwyn Salisbury MD   Signed by:   Alfred Levins, CMA on 01/01/2008   Method used:   Faxed to ...       [facility]       [address]       [place], [place]  [address]       Ph: [phone]       Fax: [phone]   RxID:   [id number]

## 2010-07-25 NOTE — Progress Notes (Signed)
Summary: refill vicodin  Phone Note Refill Request Message from:  Fax from Pharmacy on February 01, 2010 2:33 PM  Refills Requested: Medication #1:  VICODIN 5-500 MG  TABS 1 every 6 hours as needed pain   Dosage confirmed as above?Dosage Confirmed   Last Refilled: 12/02/2009  Method Requested: Fax to Local Pharmacy Initial call taken by: Raechel Ache, RN,  February 01, 2010 2:34 PM Caller: [facility]  Follow-up for Phone Call        call in #120 with 5 rf Follow-up by: Nelwyn Salisbury MD,  February 01, 2010 4:45 PM  Additional Follow-up for Phone Call Additional follow up Details #1::        Rx faxed to pharmacy Additional Follow-up by: Raechel Ache, RN,  February 01, 2010 4:50 PM    Prescriptions: VICODIN 5-500 MG  TABS (HYDROCODONE-ACETAMINOPHEN) 1 every 6 hours as needed pain  #120 x 5   Entered by:   Raechel Ache, RN   Authorized by:   Nelwyn Salisbury MD   Signed by:   Raechel Ache, RN on 02/01/2010   Method used:   Historical   RxID:   [id number]

## 2010-07-25 NOTE — Progress Notes (Signed)
Summary: rx tramadol   Phone Note From Pharmacy   Caller: [facility] Summary of Call: refill tramadol 50mg   Initial call taken by: Pura Spice, RN,  April 17, 2010 3:57 PM  Follow-up for Phone Call        done  Follow-up by: Pura Spice, RN,  April 17, 2010 3:57 PM    New/Updated Medications: TRAMADOL HCL 50 MG  TABS (TRAMADOL HCL) 1-2 by mouth every 6 hours as needed for pain Prescriptions: TRAMADOL HCL 50 MG  TABS (TRAMADOL HCL) 1-2 by mouth every 6 hours as needed for pain  #120 x 5   Entered by:   Pura Spice, RN   Authorized by:   Nelwyn Salisbury MD   Signed by:   Pura Spice, RN on 04/17/2010   Method used:   Faxed to ...       [facility] (retail)       [address]       [place], [place]  [address]       Ph: [phone]       Fax: [phone]   RxID:   [id number]

## 2010-07-25 NOTE — Progress Notes (Signed)
Summary: refills  Phone Note Refill Request Message from:  Patient----live call  Refills Requested: Medication #1:  AMITIZA 24 MCG CAPS Take 1 capsule by mouth twice a day  Medication #2:  ACTONEL 150 MG  TABS 1 tablet by mouth every month needs today. please send to [facility]  Initial call taken by: Warnell Forester,  June 02, 2010 3:40 PM  Follow-up for Phone Call        Rx faxed to pharmacy Follow-up by: Alfred Levins, CMA,  June 02, 2010 3:51 PM    Prescriptions: ACTONEL 150 MG  TABS (RISEDRONATE SODIUM) 1 tablet by mouth every month  #1 x 11   Entered by:   Alfred Levins, CMA   Authorized by:   Nelwyn Salisbury MD   Signed by:   Alfred Levins, CMA on 06/02/2010   Method used:   Faxed to ...       [facility] (retail)       [address]       [place], [place]  [address]       Ph: [phone]       Fax: [phone]   RxID:   [id number] AMITIZA 24 MCG CAPS (LUBIPROSTONE) Take 1 capsule by mouth twice a day  #60 x 11   Entered by:   Alfred Levins, CMA   Authorized by:   Nelwyn Salisbury MD   Signed by:   Alfred Levins, CMA on 06/02/2010   Method used:   Faxed to ...       [facility] (retail)       [address]       [place], [place]  [address]       Ph: [phone]       Fax: [phone]   RxID:   [id number]

## 2010-07-25 NOTE — Progress Notes (Signed)
Summary: refill hydrocodone  Phone Note From Pharmacy   Caller: [facility] Call For: fry  Summary of Call: refill hydrocodone 5/500 1 by mouth every 6 hours as needed for pain Initial call taken by: Alfred Levins, CMA,  December 14, 2008 8:48 AM  Follow-up for Phone Call        call in #120 with 5 rf Follow-up by: Nelwyn Salisbury MD,  December 14, 2008 4:58 PM  Additional Follow-up for Phone Call Additional follow up Details #1::        Phone call completed, Pharmacist called Additional Follow-up by: Alfred Levins, CMA,  December 14, 2008 5:04 PM      Prescriptions: VICODIN 5-500 MG  TABS (HYDROCODONE-ACETAMINOPHEN) 1 every 6 hours as needed pain  #120 x 5   Entered by:   Alfred Levins, CMA   Authorized by:   Nelwyn Salisbury MD   Signed by:   Alfred Levins, CMA on 12/14/2008   Method used:   Telephoned to ...       [facility] (retail)       [address]       [place], [place]  [address]       Ph: [phone]       Fax: [phone]   RxID:   [id number]

## 2010-07-27 NOTE — Progress Notes (Signed)
Summary: referral to ent  Phone Note Call from Patient Call back at Home Phone [phone]   Caller: Patient Call For: Nelwyn Salisbury MD Summary of Call: Pt would like a referral to an ENT.  Initial call taken by: [facility] CMA AAMA,  June 12, 2010 12:02 PM  Follow-up for Phone Call        refer to [facility]NT for recurrent otitis  Follow-up by: Nelwyn Salisbury MD,  June 13, 2010 8:47 AM  Additional Follow-up for Phone Call Additional follow up Details #1::        done  Additional Follow-up by: Pura Spice, RN,  June 13, 2010 8:52 AM

## 2010-07-27 NOTE — Progress Notes (Signed)
Summary: rx nexium   Phone Note From Pharmacy   [facility]er: [facility] Summary of Call: refill nexium 40 mg  Initial call taken by: Pura Spice, RN,  July 04, 2010 1:14 PM  Follow-up for Phone Call        done  [facility]  Follow-up by: Pura Spice, RN,  July 04, 2010 1:14 PM    New/Updated Medications: NEXIUM 40 MG CPDR (ESOMEPRAZOLE MAGNESIUM) 1 once daily Prescriptions: NEXIUM 40 MG CPDR (ESOMEPRAZOLE MAGNESIUM) 1 once daily  #30 x 11   Entered by:   Pura Spice, RN   Authorized by:   Nelwyn Salisbury MD   Signed by:   Pura Spice, RN on 07/04/2010   Method used:   Electronically to        [facility] (ret[facility])       [address]       [place], [place]  [address]       Ph: [phone]       Fax: [phone]   RxID:   [id number]

## 2010-07-27 NOTE — Progress Notes (Signed)
Summary: rx allopurinol   Phone Note From Pharmacy   Caller: [facility] Call For: Gershon Crane MD  Details for Reason: refill allopurinol Summary of Call: refill allopurinol 100mg . last refill date 06/01/2010  Follow-up for Phone Call        ok'd per dr fry  Follow-up by: Pura Spice, RN,  July 04, 2010 1:13 PM    New/Updated Medications: ALLOPURINOL 100 MG TABS (ALLOPURINOL) 1 by mouth once daily Prescriptions: ALLOPURINOL 100 MG TABS (ALLOPURINOL) 1 by mouth once daily  #30 x 11   Entered by:   Pura Spice, RN   Authorized by:   Nelwyn Salisbury MD   Signed by:   Pura Spice, RN on 07/04/2010   Method used:   Electronically to        [facility] ([facility])       [address]       [place], [place]  [address]       Ph: [phone]       Fax: [phone]   RxID:   [id number]

## 2010-07-28 NOTE — Letter (Signed)
Summary: [facility]  [facility]   Imported By: Maryln Gottron 09/15/2009 13:03:48  _____________________________________________________________________  External Attachment:    Type:   Image     Comment:   External Document

## 2010-08-02 NOTE — Consult Note (Signed)
Summary: [facility]  [facility]   Imported By: Maryln Gottron 07/24/2010 15:10:22  _____________________________________________________________________  External Attachment:    Type:   Image     Comment:   External Document

## 2010-08-03 ENCOUNTER — Other Ambulatory Visit: Payer: Self-pay | Admitting: Family Medicine

## 2010-08-03 DIAGNOSIS — M069 Rheumatoid arthritis, unspecified: Secondary | ICD-10-CM

## 2010-08-03 DIAGNOSIS — M1990 Unspecified osteoarthritis, unspecified site: Secondary | ICD-10-CM

## 2010-08-31 ENCOUNTER — Telehealth: Payer: Self-pay | Admitting: Family Medicine

## 2010-08-31 NOTE — Telephone Encounter (Signed)
NEEDS REFILL FOR MED: VICODIN 500MG  SENT TO [facility] [place].

## 2010-09-01 NOTE — Telephone Encounter (Signed)
rx called into pharmacy. patient  Is aware

## 2010-09-01 NOTE — Telephone Encounter (Signed)
Pt called back again re: refill of Vicodin 500 mg. Pt said that [facility] sent refill req yesterday with no response. Pt out of med. Pls call in to [facility] asap today.

## 2010-09-04 ENCOUNTER — Encounter: Payer: Self-pay | Admitting: Family Medicine

## 2010-09-04 ENCOUNTER — Ambulatory Visit (INDEPENDENT_AMBULATORY_CARE_PROVIDER_SITE_OTHER): Payer: Medicare Other | Admitting: Family Medicine

## 2010-09-04 VITALS — BP 130/82 | HR 101 | Temp 98.7°F | Wt 157.0 lb

## 2010-09-04 DIAGNOSIS — H60399 Other infective otitis externa, unspecified ear: Secondary | ICD-10-CM

## 2010-09-04 DIAGNOSIS — Z0000 Encounter for general adult medical examination without abnormal findings: Secondary | ICD-10-CM

## 2010-09-04 DIAGNOSIS — H6090 Unspecified otitis externa, unspecified ear: Secondary | ICD-10-CM

## 2010-09-04 DIAGNOSIS — I10 Essential (primary) hypertension: Secondary | ICD-10-CM

## 2010-09-04 DIAGNOSIS — H547 Unspecified visual loss: Secondary | ICD-10-CM

## 2010-09-04 DIAGNOSIS — H543 Unqualified visual loss, both eyes: Secondary | ICD-10-CM

## 2010-09-04 MED ORDER — TETANUS-DIPHTH-ACELL PERTUSSIS 5-2.5-18.5 LF-MCG/0.5 IM SUSP: 0.5000 mL | Freq: Once | INTRAMUSCULAR | Status: DC

## 2010-09-04 NOTE — Progress Notes (Signed)
  Subjective:    Patient ID: [name], female    DOB: [birth year], 55 y.o.   MRN: [id number]  HPI Here with her daughter to recheck her left ear and to fill out a form. 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 No pain. She normally wears hearing aids in both ears, but she is leaving the left aid out for the time being. She has seen Dr. Pollyann Kennedy twice, and he has prescribed a powder that she blows into the ear daily. 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 There is a medical form to be completed.    Review of Systems  Constitutional: Negative.   HENT: Positive for hearing loss and ear discharge.   Respiratory: Negative.   Cardiovascular: Negative.        Objective:   Physical Exam  Constitutional: She appears well-developed and well-nourished.  HENT:  Head: Normocephalic and atraumatic.  Right Ear: External ear normal.  Nose: Nose normal.  Mouth/Throat: Oropharynx is clear and moist. No oropharyngeal exudate.       The left ear canal has a small amount of cerumen present but no drainage that is visible. The TM has her usual perforation  Eyes: Conjunctivae and EOM are normal. Pupils are equal, round, and reactive to light.  Neck: Normal range of motion. Neck supple.  Cardiovascular: Normal rate, regular rhythm, normal heart sounds and intact distal pulses.   Pulmonary/Chest: Effort normal and breath sounds normal.  Lymphadenopathy:    She has no cervical adenopathy.          Assessment & Plan:  She will follow up with Dr. Pollyann Kennedy. The camp forms were filled out.

## 2010-09-28 ENCOUNTER — Other Ambulatory Visit: Payer: Self-pay | Admitting: Family Medicine

## 2010-10-17 ENCOUNTER — Emergency Department (HOSPITAL_COMMUNITY): Payer: Medicare Other

## 2010-10-17 ENCOUNTER — Emergency Department (HOSPITAL_COMMUNITY)
Admission: EM | Admit: 2010-10-17 | Discharge: 2010-10-18 | Disposition: A | Discharge status: Home / Self Care | Payer: Medicare Other | Attending: Emergency Medicine | Admitting: Emergency Medicine

## 2010-10-17 DIAGNOSIS — M069 Rheumatoid arthritis, unspecified: Secondary | ICD-10-CM | POA: Insufficient documentation

## 2010-10-17 DIAGNOSIS — I10 Essential (primary) hypertension: Secondary | ICD-10-CM | POA: Insufficient documentation

## 2010-10-17 DIAGNOSIS — S335XXA Sprain of ligaments of lumbar spine, initial encounter: Secondary | ICD-10-CM | POA: Insufficient documentation

## 2010-10-17 DIAGNOSIS — F17200 Nicotine dependence, unspecified, uncomplicated: Secondary | ICD-10-CM | POA: Insufficient documentation

## 2010-10-17 DIAGNOSIS — Y92009 Unspecified place in unspecified non-institutional (private) residence as the place of occurrence of the external cause: Secondary | ICD-10-CM | POA: Insufficient documentation

## 2010-10-17 DIAGNOSIS — W010XXA Fall on same level from slipping, tripping and stumbling without subsequent striking against object, initial encounter: Secondary | ICD-10-CM | POA: Insufficient documentation

## 2010-10-17 DIAGNOSIS — M549 Dorsalgia, unspecified: Secondary | ICD-10-CM | POA: Insufficient documentation

## 2010-10-23 ENCOUNTER — Ambulatory Visit (INDEPENDENT_AMBULATORY_CARE_PROVIDER_SITE_OTHER): Payer: Medicare Other | Admitting: Family Medicine

## 2010-10-23 ENCOUNTER — Encounter: Payer: Self-pay | Admitting: Family Medicine

## 2010-10-23 VITALS — BP 124/92 | HR 96 | Temp 98.5°F

## 2010-10-23 DIAGNOSIS — M069 Rheumatoid arthritis, unspecified: Secondary | ICD-10-CM

## 2010-10-23 DIAGNOSIS — M546 Pain in thoracic spine: Secondary | ICD-10-CM

## 2010-10-23 NOTE — Progress Notes (Signed)
  Subjective:    Patient ID: [name], female    DOB: 1956-03-31, 55 y.o.   MRN: [id number]  HPI Here to follow up after a fall that occurred at home on 10-17-10. As she was turning around in her living room her right knee buckled, causing her to fall and land on her bottom. No head trauma. She was taken by car to the ER, and Xrays of the spine were negative. Her bottom is bruised, but her main pain complaint is in the middle back between the shoulder blades. She does have severe spinal scoliosis, and she always has a fair amount of baseline back pain. She has been using heat and Vicodin, averaging about 3 a day. The pain seems to be slowly improving every day.    Review of Systems  Constitutional: Negative.   Respiratory: Negative.   Cardiovascular: Negative.   Musculoskeletal: Positive for back pain.       Objective:   Physical Exam  Constitutional:       Walks slowly with her cane. She seems to be at her baseline.   Cardiovascular: Normal rate, regular rhythm, normal heart sounds and intact distal pulses.   Pulmonary/Chest: Effort normal and breath sounds normal.  Musculoskeletal:       She is tender in the thoracic spine area between the scapulae. Decreased ROM.           Assessment & Plan:  This appears to be a sprain or strain. Continue rest and heat. I told her she could take the Vicodin every 4 hours if needed. I strongly advised her to use her walker at all times, even at home.

## 2010-11-07 NOTE — Procedures (Signed)
DUPLEX DEEP VENOUS EXAM - LOWER EXTREMITY   INDICATION:  Bilateral lower extremity swelling and pain.   HISTORY:  Edema:  No.  Trauma/Surgery:  Bilateral foot surgery on 07/08/2008.  Pain:  No.  PE:  No.  Previous DVT:  No.  Anticoagulants:  No.  Other:   DUPLEX EXAM:                CFV   SFV   PopV  PTV    GSV                R  L  R  L  R  L  R   L  R  L  Thrombosis    o  o  o  o  o  o  o   o  o  o  Spontaneous   +  +  +  +  +  +  +   +  +  +  Phasic        +  +  +  +  +  +  +   +  +  +  Augmentation  +  +  +  +  +  +  +   +  +  +  Compressible  +  +  +  +  +  +  +   +  +  +  Competent     +  +  +  +  +  +  +   +  +  +   Legend:  + - yes  o - no  p - partial  D - decreased   IMPRESSION:  No evidence of bilateral lower extremity deep or  superficial vein thrombosis.    _____________________________  V. Charlena Cross, MD   AC/MEDQ  D:  07/14/2008  T:  07/14/2008  Job:  [id number]

## 2010-11-07 NOTE — Op Note (Signed)
NAME:  RHINA, KRAMME               ACCOUNT NO.:  [id number]   MEDICAL RECORD NO.:  [id number]          PATIENT TYPE:  INP   LOCATION:  5011                         FACILITY:  [facility]   PHYSICIAN:  Feliberto Gottron. Turner Daniels, M.D.   DATE OF BIRTH:  Mar 04, 1956   DATE OF PROCEDURE:  03/31/2007  DATE OF DISCHARGE:                               OPERATIVE REPORT   PREOPERATIVE DIAGNOSIS:  End-stage arthritis, left knee, with Marfan  syndrome and generalized ligamentous laxity.   POSTOPERATIVE DIAGNOSIS:  End-stage arthritis, left knee, with Marfan  syndrome and generalized ligamentous laxity.   PROCEDURE:  Left total knee arthroplasty using DePuy TC3 components, a  #3 tibial component, a 2.5 left femur, a 12.5 bearing for the 2.5 femur  and a 38-mm patellar button, all components cemented with a double batch  of DePuy HV cement with 1500 mg of Zinacef.   SURGEON:  Feliberto Gottron.  Turner Daniels, MD   FIRST ASSISTANT:  Skip Mayer, PA-C.   ANESTHETIC:  General endotracheal.   ESTIMATED BLOOD LOSS:  Minimal.   FLUID REPLACEMENT:  1200 mL of crystalloid.   DRAINS PLACED:  Two medium Hemovacs and a Foley catheter.   URINE OUTPUT:  400 mL.   BLOOD LOSS:  Minimal.   TOURNIQUET TIME:  One hour 25 minutes.   INDICATIONS FOR PROCEDURE:  The patient is a 55 year old woman with  severe Marfan syndrome and generalized ligamentous laxity and valgus  deformities to both knees.  The right knee underwent total knee  replacement a few months ago and has actually done quite well; we used  the TC3 components because of the patient's generalized ligamentous  laxity, using a high post on the bearing.  She tolerated that procedure  very well and is quite satisfied with the way her right knee is doing  and desires same for the left knee, which does not have quite the same  amount of deformity, but is down to bare bone arthritic changes and is  also with inflammatory component of both of these knees and they had  somewhat  cloudy fluid that had polys and monos in them on the right side  of it, no infection.  Risks and benefits are well-known to the patient,  who is also legally blind and questions answered.  She is prepared for  surgical intervention.   DESCRIPTION OF PROCEDURE:  The patient was identified by armband and  taken to the operating room at [facility], where the appropriate  anesthetic monitors were attached and general endotracheal anesthesia  induced with the patient in the supine position.  She then received 1 g  of Ancef.  Tourniquet was applied high to the left thigh and a lateral  post and a foot positioner applied to the table.  The left lower  extremity was prepped and draped in the usual sterile fashion from the  ankle to the midthigh and we began the procedure by wrapping the limb in  an Esmarch bandage and inflating the tourniquet to 350 mmHg and we made  an anterior midline incision centered over the patella, going 2 cm  distal to and just medial to the tibial tubercle and then proximally we  went 1 handbreadth above the patella.  Small bleeders in the skin and  subcutaneous tissue were identified and cauterized.  The transverse  retinaculum was reflected medially and a medial parapatellar arthrotomy  accomplished.  Prepatellar fat pad was resected.  The superficial medial  collateral ligament was elevated off the medial tibial flare, going from  anterior to posterior, leaving it intact distally, exposing the medial  tibial plateau.  The patella was everted, the knee was flexed and the  patient was noted have a large amount of pannus.  Joint fluid was then  sent off for Gram stain and culture.  It came back polys, monos and no  organisms and again, she had an inflammatory component to all of her  arthritides.  The patient's ligaments were so loose we easily subluxed  the tibia anteriorly with external rotation, entered the proximal tibia  with the DePuy step drill followed by  the intramedullary rod and a 2-  degree posterior slope cutting guide, removing only a thin wafer of bone  posteriorly, where she had some bony erosion medially and laterally with  the cutting guide in place.  Satisfied with the proximal tibial  resection, we entered the distal femur 2 mm anterior to the PCL origin,  followed by the IM rod set at 5 degrees left.  We set the cutting guide  at 10 mm and performed a 10-mm resection with the guide set along the  epicondylar axis.  We then sized for a 2.5 femoral component, pinned to  the sizing guide in neutral, brought up the chamfer cutting guide,  hammered this in place, performed our anterior, posterior and chamfer  cuts without difficulty, followed by the TC3 box cut.  The patella had  quite a bit of erosion and we mainly set the cutting guide to remove  only a thin wafer of bone along the medial facet, where the bone was  quite thin, and this removed about a 8 or 9 mm of bone along the apex.  We sized for a 38 button and drilled the trial patellar button.  With  the knee hyperflexed, once again we sized for #3 tibial baseplate,  performed the reaming with the TC3 reamer through the smokestack,  brought up the trial TC3 baseplate and affixed it with the Deltafit keel  punch.  The 2.5 left TC3 femoral component trial was then placed on the  femur and we sized with 10 and 12.5 bearings; the 12.5 came to full  extension and had good ligamentous stability.  The 38-mm patellar trial  button tracked normally.  At this point, all trial components were  removed and the bone was cleaned with a Water-Pik with pulse lavage and  dried with suction and sponges.  At the back table, a double batch of  DePuy HV cement with 1500 mg of Zinacef was mixed and then applied to  all bony metallic mating surfaces, except for the posterior condyles of  the femur itself.  In order, we hammered into place a #3 TC3 baseplate  and removed excess cement, a 2.5 left  TC3 femoral component and removed  excess cement and a 12.5 TC3 bearing mated to the 2.5 femur.  The 38-mm  patellar button was then squeezed into place.  The knee was held in near  extension with compression as the cement cured.  After the cement cured,  medium Hemovac drains were placed  deep in the wound.  We checked our  tracking one more time; the patella tracked with no subluxation.  The  wound was once again pulse-lavaged clean, the parapatellar arthrotomy  closed running #1 Vicryl suture, the subcutaneous tissue, which was  quite thin, with 2-0 Vicryl suture and the skin with skin staples.  A  dressing of Xeroform, 4 x 4 dressing sponges, Webril, an Ace wrap and a  knee immobilizer were then applied, tourniquet let down, patient  awakened and taken to the recovery room without difficulty.      Feliberto Gottron. Turner Daniels, M.D.  Electronically Signed     FJR/MEDQ  D:  03/31/2007  T:  04/01/2007  Job:  [id number]

## 2010-11-07 NOTE — Discharge Summary (Signed)
NAME:  [name], [name]               ACCOUNT NO.:  [id number]   MEDICAL RECORD NO.:  [id number]          PATIENT TYPE:  INP   LOCATION:  5011                         FACILITY:  [facility]   PHYSICIAN:  Feliberto Gottron. Turner Daniels, M.D.   DATE OF BIRTH:  1956/02/04   DATE OF ADMISSION:  03/31/2007  DATE OF DISCHARGE:                               DISCHARGE SUMMARY   DIAGNOSIS:  End-stage degenerative joint disease of the left knee.   SECONDARY DIAGNOSES:  1. Marfan's syndrome.  2. Hypertension.  3. Possible stroke.  4. Diabetes.  5. Degenerative joint disease.  6. Gout.  7. Anemia.  8. Reflux disease.   DISCHARGE SUMMARY:  The patient is a 55 year old woman with severe  Marfan's syndrome and generalized ligamentous laxity and valgus  deformities of bilateral knees.  The right knee underwent total knee  replacement several months prior to this admission and she has actually  done quite well.  She tolerated the procedure very well and is quite  satisfied with the way her right knee is doing.  Desires the same for  her left knee which she does not have the same amount of deformity  preoperatively but is down to bare bone arthritic changes.  Also noted  to have severe inflammatory components of pain.  Risks and benefits were  well known to the patient.  Questions were answered.  Risks and benefits  were discussed.   ALLERGIES:  The patient has no known drug allergies.   MEDICATIONS AT THE TIME OF ADMISSION:  1. Centrum Silver.  2. Amitiza.  3. Nexium.  4. Clarinex.  5. Keflex.  6. Aspirin.  7. Colchicine.  8. Darvocet.  9. Diprolene.  10.Ultram.  11.Prednisone.   PAST MEDICAL HISTORY:  Significant for:  1. Hypertension.  2. Marfan's syndrome.  3. Possible stroke.  4. Diabetes.  5. DJD.  6. Gout.  7. Anemia.  8. Reflux disease.   PAST SURGICAL HISTORY:  1. Knee scope in 1990.  2. Hernia repair in 2008.  3. Neck fusion in 1986.   SOCIAL HISTORY:  Positive for tobacco 1 pack per  day.  No ethanol.  No  IV drug abuse.  The patient is a widow.  Is disabled.   FAMILY HISTORY:  Father died at age 36.  Mother died at age 68.   REVIEW OF SYSTEMS:  Positive for hearing loss with a hearing aid.  Poor  appetite with weight loss.  The patient is legally blind.  She denies  any chest pain, shortness of breath, or recent illness.   PHYSICAL EXAMINATION:  VITAL SIGNS:  Temperature is 97.1, pulse is 66,  blood pressure is 128/92.  The patient is 5 foot 9 inches, 136 pounds.  HEENT:  Head is normocephalic, atraumatic.  Ears:  TMs are clear.  Eyes  are not reactive.  She is legally blind in both eyes.  Nose is patent.  Throat is benign.  NECK:  Limited range of motion.  CHEST:  Clear to auscultation and percussion.  HEART:  Regular rate and rhythm.  ABDOMEN:  Soft, nontender.  EXTREMITIES:  Range  of motion of the right knee zero to 115 degrees.  Stable ligamentously.  Well-healed normal scar.  Left knee range of  motion zero to 100 degrees with positive pain and crepitus, otherwise  neurovascularly intact.   X-RAY DATA:  X-rays show bone-on-bone changes of the left knee.   LABORATORY DATA:  Preoperative labs including CBC, CMET, chest x-ray,  EKG, PT and PTT were all within normal limits.   HOSPITAL COURSE:  On the day of admission the patient underwent a left  total knee arthroplasty using DePuy TC3 components, #3 tibia, 2.5 left  femur, 12.5-mm bearing, size 2.5 for the femur and a 38-mm patellar  button.  All components cemented with Zinacef impregnated into the DePuy  HV cement.  Medium Hemovac was placed double-arm into the knee.  Foley  catheter was placed perioperatively.  The patient was placed on  perioperative antibiotics.  She was placed on postoperative Coumadin  prophylaxis with a target INR of 1.5 to 2.  She was placed on  postoperative Dilaudid pain pump for pain control.  Physical therapy was  begun the first postoperative day.   Postoperative day #1,  the patient was awake and alert, in moderate pain,  taking p.o. well.  No nausea or vomiting.  Drain was discontinued.  Hemoglobin 9.8.  PT 13.2.  Cultures taken intraoperatively were  negative.  Physical therapy continued for ambulation and transfer  training.   Postoperative day #2, the patient was noted by physical therapy and OT  to be a good candidate for rehab stay at a skilled nursing facility and  that process was begun.  She was complaining of moderate pain.  She was  afebrile.  Hemoglobin 8.5.  INR 1.2.  Dressing was dry.  Calf was soft  and nontender.  Foley was discontinued.  PCA was discontinued.   Postoperative day #3, the patient was complaining of pain in the left  knee.  No dizziness or fatigue.  She was afebrile.  Hemoglobin 7.7.  INR  1.3.  Dressing was dry.  Wound was benign.  No signs of infection.  No  other signs or symptoms of the anemia.   Postoperative day #4, the patient was awake and alert in moderate pain,  well controlled with oral pain medications.  No shortness of breath.  She continued to make slow but steady progress in physical therapy.   Postoperative day #5, the patient was complaining of fatigue.  Hemoglobin remained low at 7.9.  Blood pressure had some low readings as  well.  Dressing was dry.  Wound was benign.  Because of her decrease in  blood pressure as well as fatigue, the patient was typed and crossed for  2 units of packed red cells.  Physical therapy was continued.  The wound  remained benign.   Postoperative day #6, the patient was afebrile without complaint.  Hemoglobin increased to 10.7.  The patient stated she felt better after  receiving the blood transfusion.  INR 1.2.  Dressing was dry and was  benign.  She was planning to be transferred to the skilled nursing  facility the following day.   Postoperative day #7, the patient was without complaint with the  exception of increased pain in the lower leg.  Because of that, we will   obtain Doppler studies to make sure there is no evidence of clot.  If  that is clear, she will be transferred to skilled nursing facility for  continued rehab potential with hopeful discharge to  home in a short  amount of time thereafter.  She will continue to wear the knee  immobilizer whenever she is walking or out of bed, but may take it off  while she is in bed and work in gentle range of motion, although again  this should be stressed that motion is not our main goal at this point.  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  Her activity is  weightbearing as tolerated with immobilizer.  Dressing changes daily or  as needed to maintain a dry dressing.  Diet is regular.   DISCHARGE MEDICATIONS:  1. Centrum Silver daily.  2. Amitiza 2 mcg b.i.d.  3. Nexium 40 mg daily.  4. Clarinex 5 mg daily.  5. Keflex 250 mg daily.  6. Aspirin 81 mg daily.  7. Colchicine 0.6 mg b.i.d.  8. Diprolene AF 0.05% cream as needed.  9. Ultram as needed.  10.Prednisone 20 mg daily.  11.Percocet 5 mg 1-2 q.4-6h. p.r.n. pain.   FOLLOWUP:  The patient should return to see Dr. Turner Daniels in his office in  approximately 1 week's time.  Please call to make an appointment at [phone].   PROCEDURE WHILE IN HOSPITAL:  Left total knee arthroplasty.   At the time of the patient's discharge, she was both medically stable  and improved.      Laural Benes. Jannet Mantis.      Feliberto Gottron. Turner Daniels, M.D.  Electronically Signed    JBR/MEDQ  D:  04/07/2007  T:  04/07/2007  Job:  [id number]

## 2010-11-07 NOTE — Op Note (Signed)
NAME:  [name], [name]               ACCOUNT NO.:  [id number]   MEDICAL RECORD NO.:  [id number]          PATIENT TYPE:  INP   LOCATION:  5039                         FACILITY:  [facility]   PHYSICIAN:  Feliberto Gottron. Turner Daniels, M.D.   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  It has taken her that long to get the courage up to get this  done.   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  It has taken her that long to get the courage up to get this  done.   PROCEDURE:  Complex right total knee arthroplasty using DePuy TC3  components on the femoral side, a #3 right femur with a 5 degrees 75 x  20 stem, on the tibial side a 2.5 tibial baseplate TC3 and a 15 mm  rotating platform bearing.  We used a 38 mm patellar button.  A double  batch of DePuy HV1 cement with 1500 mg Zinacef.   SURGEON:  Feliberto Gottron. Turner Daniels, M.D.   ASSISTANT:  Skip Mayer PA-C.   ANESTHETIC:  General endotracheal.   ESTIMATED BLOOD LOSS:  200 mL.   FLUID REPLACEMENT:  1500 mL crystalloid.   URINE OUTPUT:  300 mL.   INDICATIONS FOR PROCEDURE:  55 year old woman who is blind in both eyes,  deaf, has Marfan's syndrome with all the usual stigmata and has end-  stage arthritis of both knees with significant ligamentous laxity  requiring augmented fixation for the femoral and tibial components.  We  selected the Depuy TC3 system which has a minimum of about a 60 mm stem  on each component.  Risks and benefits of surgery discussed over a 15-  year period.   X-rays show dished out posterior tibia with subluxation of  the tibia about 2 cm anterior on the femur to the point where the  patella [place] usually in a floating position. Anyway, risks and benefits of  surgery discussed, questions answered.   DESCRIPTION OF PROCEDURE:  The patient identified by armband, taken the  operating room at [facility]  Appropriate anesthetic monitors  were attached and general LMA anesthesia induced with the patient in  supine position.  The tourniquet was applied high to the right thigh.  Lateral post and foot positioner applied to the table.  Right lower  extremity prepped, draped in usual sterile fashion from the ankle to the  tourniquet.  The limb was wrapped with an Esmarch bandage, tourniquet  inflated to 300 mmHg.  We began the procedure by making anterior midline  incision 18 cm in length centered over the patella.  She had an old  medial parapatellar arthrotomy incision that was posterior medial  actually that was simply ignored.  We cut through the skin and  subcutaneous tissue down to the level of the quadriceps tendon and  transverse retinaculum over the patella which was reflected medially  allowing medial parapatellar arthrotomy.  The patella was everted, the  prepatellar fat pad resected.  There is a large amount of joint fluid in  the joint and diffuse ligamentous laxity.  The medial collateral  ligament was elevated from anterior to posterior along the proximal  tibia leaving it intact distally allowing removal of some fairly  impressive medial osteophytes from the tibia and a huge osteophyte from  the medial side of the femur that came off in one piece.  It was a  crescent pretty much the shape of the femur itself in the sagittal  plane.  The notch region was completely stenosed over with bone and had  to be recreated using a 1/2 inch wide osteotome and distal femur.  There  was no ACL, there was a little remnant of the PCL once we got  the notch  opened up.  On the lateral side there were also impressive osteophytes.  We made a provisional cut removing a centimeter of the anterior proximal  bone from the tibia.  It was dished out so much we had to go back and  revisit this and remove another 8 mm after placing an IM guide.  At this  point the knee was hyperflexed allowing placement of the step drill from  the DePuy set into the proximal tibia followed by the intramedullary rod  and a 2 degree posterior slope tibial cutting guide was fixed removing  another 8 to 9 mm of bone from the high side, leaving some small  posterior deficiencies that were 1-2 mm in depth and about a centimeter  in width although  this was trimmed back later in the case.  Satisfied  with a proximal tibial resection, we then entered the distal femur with  the intramedullary guide, followed by the IM rod and a 5 degrees right  distal femoral cutting guide set 10 mm.  This was pinned in place along  the epicondylar axis and the distal femoral cut accomplished.  We sized  for #3 femoral component, pinned the cutting guide in place in neutral  rotation and then placed the chamfer cutting guide.  The anterior and  posterior cuts followed by the chamfer cut were accomplished and then  the box cut for the C3 was also accomplished.  The patella was actually  quite large, measured at 27 mm in width.  We set the cutting guide for  17 mm and removed the posterior 10 mm of the patella, sized for a 38 mm  button and drilled for the button.  At this point we addressed the  proximal tibia, sized for a 2.5 proximal tibial baseplate with the  standard stem that goes along with a TC3 component.  With a base plate  pinned in place we then reverse reamed through the smokestack with the  conical reamer followed by the Delta keel punch.  We then reamed for the  femoral rod up to a 20 mm reamer to the depth for a 75 mm stem and assembled a trial femoral component with a  #3 right femoral component, 5  degrees with a 20 mm x 75 stem.  This was hammered into place for a  trial.  The 2.5 tibial baseplate with no stem extension was already in  place and we  sized up to a 15 mm polyethylene rotating platform bearing  and performed a trial reduction that revealed no laxity in extension,  minimal laxity in flexion and the 38 mm patellar button tracked with no  thumb press.  At this point the trial components removed.  All bony  surfaces were water picked clean and dried with suction and sponges and  we then assembled the femoral component which was a 3 right femur with a  5 degrees module and a 20 mm x 75 stem.  On the tibial side we just used  a 2.5 TC3 baseplate and the patella with a 38 button.  A double batch of  DePuy 1 cement with 1500 mg Zinacef was mixed, applied to all bony and  metallic mating surfaces except for the posterior condyles of the femur  itself and in order we hammered into place the tibial baseplate and  removed excess cement.  The free right femur with the 20 mm stem and  removed excess cement and a 38 mm patellar button was squeezed onto the  patella with a clamp.  We then placed the 15 mm TC3 bearing on the  tibial baseplate to mate with the femur and reduced the knee.  The knee  was held full extension and excess cement removed.  Because of the  extensive dissection that was required, we also removed some very large  loose bodies from the posterior aspect of the knee some of them up to 2  cm in diameter x 5 but because of the dissection required to remove  these loose bodies,  we went ahead and coated all the surfaces with  FloSeal after irrigation and let the tourniquet down.  No significant  bleeders were noted.  Medium hemostats were placed deep in the wound  which was then closed with running #1 Vicryl suture in the tendon, 0-0  and 2-0 undyed Vicryl suture in the subcutaneous tissue and staples on  the skin.  A dressing of Xeroform,  4x4 dressing sponges, Webril and Ace  wrap applied.  At this point the patient was awakened and taken to the  recovery room without difficulty.     Feliberto Gottron. Turner Daniels, M.D.  Electronically Signed    FJR/MEDQ  D:  12/04/2006  T:  12/05/2006  Job:  [id number]

## 2010-11-07 NOTE — Discharge Summary (Signed)
NAME:  [name], [name]               ACCOUNT NO.:  [id number]   MEDICAL RECORD NO.:  [id number]          PATIENT TYPE:  INP   LOCATION:  5039                         FACILITY:  [facility]   PHYSICIAN:  Feliberto Gottron. Turner Daniels, M.D.   DATE OF BIRTH:  [birth year]   DATE OF ADMISSION:  12/04/2006  DATE OF DISCHARGE:  12/10/2006                         DISCHARGE SUMMARY - REFERRING   PRIMARY DIAGNOSIS:  End-stage arthritis of the right knee.   IN-HOSPITAL PROCEDURE:  Right total knee arthroplasty.   HISTORY OF PRESENT ILLNESS:  The patient is a 55 year old woman with a  many year history of right knee pain with DJD positive by x-ray as well  as arthroscopic films.  She has severe ligamentous laxity secondary to  Marfan's syndrome.  We have treated her for over 15 years for this  problem.  It has taken her that long to get up the courage to have a  procedure done to correct this problem.  History and physical included a  discussion of the risks versus benefits of this procedure and she wished  to proceed with the operation.   ALLERGIES:  NO KNOWN DRUG ALLERGIES.   MEDICATIONS:  1. Nexium.  2. Docusate.  3. Cephalexin.  4. Amitiza.  5. Colchicine.  6. Clarinex.  7. Norvasc.  8. Mobic.  9. Darvocet.   PAST MEDICAL HISTORY:  1. Usual childhood diseases.  2. Hypertension.  3. Marfan's.  4. CVA.  5. Diabetes.  6. DJD.  7. Gout.  8. Anemia.  9. Reflux disease.   PAST SURGICAL HISTORY:  1. Knee scope.  2. Hernia repair.  3. Neck fusion.   SOCIAL HISTORY:  One pack per day tobacco.  No ethanol.  Past history  long ago.  No IV drug abuse.  She is widowed, disabled due to blindness.   FAMILY HISTORY:  Father died at age 65, mother died at age 61.  Further  details not available.   REVIEW OF SYSTEMS:  Positive for hearing loss with positive hearing aid,  poor appetite and weight loss as well as, of course, bilateral complete  blindness.  She denies any shortness of breath, chest pain or  recent  illness.   PHYSICAL EXAMINATION:  VITAL SIGNS:  Temperature 98.9, pulse 90,  respirations 16, blood pressure 119/83.  She is a 5 foot 9, 136 pound  female.  HEENT:  Neck has decreased range of motion of the C-spine.  Head is  normocephalic and atraumatic.  Ears:  TMs are clear.  Nose is patent.  Throat benign.  Eyes are absent of any movement and actually missing one  of the eyes.  HEART:  Regular rate and rhythm.  ABDOMEN:  Soft, nontender.  EXTREMITIES:  Right knee range of motion is 0-100 degrees, unstable  medial and lateral collateral ligaments with positive and crepitus.  SKIN:  She has a healed ulceration on the skin, otherwise normal.   X-ray showed bone on bone arthritis with subluxation posteriorly as well  as numerous loose bodies in the posterior area of the neck.   Preoperative labs including CBC, CMET, chest x-ray, EKG, PT  and PTT are  all within normal limits.   HOSPITAL COURSE:  On the date of admission, the patient was taken to the  operating room at [facility] where she underwent a  complex right total knee using a DePuy TC3 component on the femoral  side.  A #3 femur with 5 degrees, 75 x 20 stem on the tibial side, 12.5  tibial base plate with a TC3 and 15 mm rotating platform bearing, a 38  mm patellar button.  All components cemented with antibiotic-laden  cement.  Medium Hemovac double arm was placed into the wound.  The  patient was placed on perioperative antibiotics.  Preoperative Foley was  placed.  She was placed on PCA for pain control.  Physical therapy was  not begun until the first postoperative day as no CPM would be indicated  due to inherent instability of her knee.   On postoperative day #1, patient was awake and alert.  No nausea or  vomiting.  She was taking p.o.'s well.  Vital signs were stable.  Hemoglobin 8.6, wbc 10.4.  Moving toes well.  Wound is clean and dry.  X-  rays show well placed stem components.  She  was otherwise stable.   Postoperative day #2, the patient continued with pharmacy protocol for  Coumadin prophylaxis.  On postoperative day #2, patient was complaining  of moderate pain, temperature afebrile, INR 1.3, hemoglobin 7.2.  Dressing was dry.  She had significant symptoms of postoperative blood  loss anemia including fatigue and dizziness, so we transfused her with 2  units of packed red cells.  Foley and PCA were discontinued.  FL2 form  was completed postoperative day #2.  The patient continued to make slow  but steady progress with physical therapy, learning ambulation.   Postoperative day #3, the patient was basically unchanged, still making  slow but steady progress.  No signs of infection.  Hemoglobin had  increased to 9.6 after transfusion, wbc 8.9, INR 1.3, otherwise stable.  Continued with current plan of care.   Postoperative day #4, basically unchanged from previous exam.  Wound  remained clean and dry.  Hemoglobin remained stable, INR 1.4.   Postoperative day #5, patient was awake and alert.  No nausea or  vomiting.  Vital signs were stable.  Wound was clean and dry.  INR would  increase to 1.7.  Range of motion 0-90 degrees and stable.   Nursing home placement continued to evolve and by postoperative day #6,  the patient was found to be awake and alert and wound clean and dry.  She was afebrile.  INR 2. Urine output 300 mL per shift.  Still was  unable to do straight leg raise but otherwise progressing well with  physical therapy.  She was cleared for transfer to skilled nursing  facility and a bed did become available.   ACTIVITY:  At the time of her discharge, activity is weightbearing as  tolerated with immobilizer and assist from physical therapy or nursing.  She is okayed for seated showers.  Dressing changes daily or as needed  if the dressing should become wet.   DIET:  Regular.   DISCHARGE MEDICATIONS:  1. Amitiza 24 mcg p.o. b.i.d. 2. Claritin 10  mg daily.  3. Colace 100 mg b.i.d.  4. Colchicine 0.6 mg b.i.d.  5. Coumadin per pharmacy protocol with a target INR of 1.5 to 2 for      two weeks postoperatively.  6. Keflex 250  mg p.o. nightly.  7. Lantus insulin 20 units subcu daily.  8. Multivitamin one p.o. daily.  9. Norvasc 5 mg p.o. daily.  10.Protonix 40 mg EC tablet p.o. daily w.c.  11.Triamcinolone 0.5% cream apply b.i.d.  12.Dulcolax 10 mg per rectum daily p.r.n. suppository.  13.Fleet's enema as needed.  14.Percocet 5 mg one to two q.4h. p.r.n. pain.  15.Reglan 10 mg p.o. q.8h. p.r.n. nausea.  16.Restoril 15-30 mg p.o. nightly p.r.n. sleep.  17.Robaxin 500 mg p.o. q.6h. and p.r.n. spasm.   She should return to clinic in approximately one week's time for follow-  up with Dr. Turner Daniels, call [phone] for appointment.   DIAGNOSIS FOR THIS ADMISSION:  End-stage degenerative joint disease of  the right knee.   IN-HOSPITAL PROCEDURE:  Right total knee arthroplasty.      Laural Benes. Jannet Mantis.      Feliberto Gottron. Turner Daniels, M.D.  Electronically Signed   JBR/MEDQ  D:  12/10/2006  T:  12/10/2006  Job:  [id number]

## 2010-11-10 NOTE — Op Note (Signed)
NAME:  [name], [name]               ACCOUNT NO.:  [id number]   MEDICAL RECORD NO.:  [id number]          PATIENT TYPE:  OIB   LOCATION:  0098                         FACILITY:  [facility]   PHYSICIAN:  Sandria Bales. Ezzard Standing, M.D.  DATE OF BIRTH:  Jul 27, 1955   DATE OF PROCEDURE:  10/17/2006  DATE OF DISCHARGE:                               OPERATIVE REPORT   PREOPERATIVE DIAGNOSIS:  Right femoral hernia.   POSTOPERATIVE DIAGNOSIS:  Medium size right femoral hernia.   OPERATIONS:  Open repair right femoral hernia.   SURGEON:  Sandria Bales. Ezzard Standing, M.D.   ANESTHESIA:  General LMA with approximately 30 cc of 0.25% Marcaine with  epinephrine.   COMPLICATIONS:  None.   INDICATIONS FOR PROCEDURE:  [name] is a 55 year old black female, a  patient of Dr. Gershon Crane who has been blind since her teenage years,  has a mass in her right groin consistent with a probable right inguinal  or femoral hernia.   The indications and potential complications were explained to the  patient.  Potential complications include but not limited to bleeding,  infection, recurrence of the hernia and nerve injury.   DESCRIPTION OF PROCEDURE:  The patient was taken to the operating room  in the supine position.  Her right side had been marked preoperatively.  We did a time out where we identified the procedure and the patient.  Her lower abdomen was prepped with Betadine and sterilely draped.  She  was given 1 gram of Ancef at the initiation of the procedure.  A right  inguinal incision was made with sharp dissection carried down exposing  external oblique fascia.  The external oblique fascia was opened.  I  then went below the inguinal ligament and almost immediately adjacent to  her femoral vein was a sac which protruded about 3.5 to 4 cm inferior to  the iliopubic fascia.  I reduced this above the iliopubic tract.  I  opened it.  I saw no obvious bowel or bladder in it.  I then ligated  with a 0 chromic suture.   I then freed up the round ligament off of the pubic tubercle, took this  down to the internal ring which I then ligated with a 0 chromic suture.  I then created an inguinal floor repair using polypropylene mesh.  I cut  the mesh to about 2 and 1/2 x 4 and 1/2 inches.  I sewed it medially to  the pubic tubercle, inferiorly to Cooper ligament, laterally to the  shelving edge of the ilioinguinal ligament and superiorly to  transversalis fascia.   The mesh lay flat.  I then infiltrated the subfascial and subcutaneous  tissue with about 30 cc of 0.25% Marcaine.  I closed the external  oblique fascia over the mesh with a 3-0 Vicryl suture interrupted.  I  closed Scarpa with 3-0 Vicryl suture, the skin with a 5-0 Monocryl  suture.  Painted the wound with tincture of Benzoin and Steri-Strips.  Sponge and needle count were correct at the end of the case.   The patient tolerated the procedure well and  was transferred to the  recovery room in good condition.  Because of her blindness and other  medical conditions, I will keep her overnight in the hospital.      Sandria Bales. Ezzard Standing, M.D.  Electronically Signed     DHN/MEDQ  D:  10/17/2006  T:  10/17/2006  Job:  [id number]   cc:   Christina Senior A. Clent Ridges, MD  [address] [place]  [place] [address]   Christina Boop, MD,FACG  [facility]  [address] [place], [place] [address]   [name], M.D.  Fax: [phone]

## 2010-11-10 NOTE — Discharge Summary (Signed)
NAME:  [name], [name] NO.:  [id number]   MEDICAL RECORD NO.:  [id number]                   PATIENT TYPE:  INP   LOCATION:  0453                                 FACILITY:  [facility]   PHYSICIAN:  Corwin Levins, M.D. [facility]             DATE OF BIRTH:  1955/12/11   DATE OF ADMISSION:  10/14/2002  DATE OF DISCHARGE:  10/16/2002                                 DISCHARGE SUMMARY   DISCHARGE DIAGNOSES:  1. Nausea, vomiting, and abdominal pain, questionable etiology, possible     gastroenteritis versus constipation.  2. Constipation.  3. Blindness.  4. Hypertension.  5. History of recurrent urinary tract infection.  6. History of Marfan.  7. History of alcoholism, now abstinent.  8. History of multiple orthopedic procedures related to Marfan syndrome.  9. Gastroesophageal reflux disease.  10.      Apparent recurrent abdominal pain since December of last year.   PROCEDURE:  October 15, 2002, abdominal ultrasound with questionable  gallbladder polyp, stones.  There was question of prominent pancreatic duct  but probable normal variant.   CONSULTS:  None.   HISTORY AND PHYSICAL:  Please see that dictated on the day of admission per  Dr. Amador Cunas, October 14, 2002.   HOSPITAL COURSE:  The patient is a 55 year old black female with history as  above who presented October 14, 2002, primarily with abdominal pain of unknown  etiology.  She was treated with IV fluids, antiemetics, and further workup  including a UA C&S which proved normal.  Sedimentation rate 5, within normal  limits.  White blood cell count 7.  LFTs, electrolytes, BUN, creatinine,  hemoglobin otherwise within normal limits.  Abdominal ultrasound obtained,  results as above.  Her second day of hospitalization, the nausea and  vomiting improved to where she was able to take p.o.  By the time of  discharge she was taking p.o. medications and sitting up, eating well.  Nausea and vomiting resolved,  although she still had some abdominal  discomfort persisting.  She has been having bowel movements, states she is  having some constipation.  She remained afebrile throughout.  No other new  problems identified through her hospitalization.  It was felt at this time  that she had gained maximum benefit from this hospitalization and is to be  discharged home.   DISPOSITION:  Discharged to home in good condition.   ACTIVITY:  No activity restrictions.   DIET:  As per previous.    DISCHARGE MEDICATIONS:  As per all home medications with the addition of  lactulose 30-60 mL p.o. b.i.d. on a daily basis.  Medications prior to  admission included Bentyl, Keflex, Nexium, Norvasc, and Vioxx.  She was also  given Levsin sublingual for p.r.n. use.   FOLLOW-UP:  She will follow up with Dr. Clent Ridges, [facility], in one to  two weeks.  Corwin Levins, M.D. [facility]    JWJ/MEDQ  D:  10/16/2002  T:  10/16/2002  Job:  [id number]   cc:   Jeannett Senior A. Clent Ridges, M.D. [facility]

## 2010-11-10 NOTE — Assessment & Plan Note (Signed)
[facility]                         GASTROENTEROLOGY OFFICE NOTE   NAME:[name]                      MRN:          [id number]  DATE:08/19/2006                            DOB:          05-01-1956    Ms. Londo comes in complaining of some worsening constipation. Mainly  incomplete defecation. She remains nauseated at times. Some of that is  from the [place]. The amitiza does help her move her bowels. Recently,  she stopped it when she was placed on colchicine for gout and was  concerned that she would get diarrhea with those two, but she was  actually constipated and had to restart the amitiza. She wants to know  if she can take Fiber One. Her weight is overall stable. It does tend to  fluctuate.   Medications are listed and reviewed in the chart.   PHYSICAL EXAMINATION:  Shows her weight to be 137.4 pounds, pulse 64,  blood pressure 100/68.  The abdomen is soft and nontender.   ASSESSMENT:  Constipation-predominant irritable bowel syndrome.   PLAN:  1. Continue amitiza.  2. It is okay to continue to use prune juice and try to add some fiber      supplements like Fibercon. She will see what works best.  3. I did refill her amitiza for 5 to 6 months, and she will come back      and see me as needed.  4. Note, she is due to have bilateral knee replacement. I cautioned      her to look out for constipation when she is on narcotics and      immobile. She may need to add MiraLax on top of her amitiza at that      time.     Iva Boop, MD,FACG  Electronically Signed    CEG/MedQ  DD: 08/19/2006  DT: 08/20/2006  Job #: [id number]

## 2010-11-10 NOTE — H&P (Signed)
NAME:  [name] NO.:  [id number]   MEDICAL RECORD NO.:  [id number]          PATIENT TYPE:  INP   LOCATION:  0104                         FACILITY:  [facility]   PHYSICIAN:  Corwin Levins, MD      DATE OF BIRTH:  [birth year]   DATE OF ADMISSION:  07/27/2006  DATE OF DISCHARGE:                              HISTORY & PHYSICAL   CHIEF COMPLAINT:  Right facial weakness and numbness, new today.   HISTORY OF PRESENT ILLNESS:  [name] is a 55 year old black female  with onset at 2 a.m. of right-sided facial numbness and weakness as she  was going to sleep.  She felt overall weak during the day, came to the  ER about 3 a.m. really for that purpose, but she mentioned the above  symptoms.  Was noticed to have some small right-sided facial weakness  with slight slurred speech.  She has had a headache all day as well as  somewhat decreased appetite.  No other acute symptoms.   PAST MEDICAL HISTORY:  1. Prostatic left eye.  2. Traumatic blindness, right eye.  3. Hypertension.  4. Hypercholesterolemia.  5. Chronic smoker.  6. Gastroesophageal reflux disease.  7. History of remote alcohol abuse/dependence.  8. History of recurrent UTI, normally suppressive Keflex dosing.  9. 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  10.Chronic constipation.  11.Allergies.  12.Degenerative joint disease.  13.History of gout.  14.History of rheumatoid arthritis.  15.Diverticulosis of the sigmoid.  16.Hepatic cyst.  17.History of gallbladder polyp.   PAST SURGICAL HISTORY:  As above as well as knee surgery in 1990 and C-  spine surgery in 1986 secondary to fracture.   ALLERGIES:  No known drug allergies.   CURRENT MEDICATIONS:  1. Amitiza 24 mcg once p.o. b.i.d.  2. Clarinex 5 mg p.o. daily.  3. Mobic 7.5 mg p.o. daily.  4. Norvasc 10 mg p.o. daily.  5. Zocor off for one week per Dr. Clent Ridges secondary to myalgias.  6. Nexium 40 mg p.o. daily.  7. Keflex 250 mg  p.o. daily, been off for the last seven days as she      was on Cipro instead but now discontinued today.  8. Colchicine 0.6 mg b.i.d.   SOCIAL HISTORY:  One daughter from whom she is estranged.  No alcohol.  She is a chronic smoker.   FAMILY HISTORY:  No CVA.   REVIEW OF SYSTEMS:  Otherwise noncontributory.   PHYSICAL EXAMINATION:  GENERAL:  [name] is a 55 year old black  female.  VITAL SIGNS:  Blood pressure 115/81, heart rate 98, respirations 15.  O2  saturation 99%.  Temperature 99.  HEENT:  Sclerae clear. Tympanic membranes clear.  Pharynx benign.  NECK:  No lymphadenopathy, JVD, thyromegaly.  CHEST:  No rales or wheezing.  CARDIOVASCULAR:  Regular rate and rhythm.  ABDOMEN:  Soft, nontender.  Positive bowel sounds.  EXTREMITIES:  No edema.  NEUROLOGIC: Right facial nasal labial straightening to a mild degree.   LABORATORY DATA:  Chest x-ray shows no acute disease.  Heat CT scan:  No  acute disease.  EKG:  Sinus rhythm U wave negative.  CPK-MB less than  1.3.  Troponin-I less than 0.05.  Hemoglobin 11.6.  White blood cell  count 5.6.  Electrolytes within normal limits.  BUN 4, creatinine 0.6,  glucose 100.   ASSESSMENT/PLAN:  1. Right facial weakness and numbness with headache, new onset 2 a.m.      today.  Probable cerebrovascular accident but not amenable to acute      treatment secondary to time course.  She will be admitted to      perform neuro checks.  Will order carotid Dopplers, 2-D      echocardiogram, speech evaluation, neuro stroke team evaluation and      start aspirin 325 mg one per day.  2. Hypercholesterolemia.  Start Lipitor 20 mg daily after check of      lipids.  3. Hypertension, otherwise stable.  4. Chronic smoker.  Urged to quit.  5. Other medical problems, continue home medications.  6. Prophylaxis.  Start proton pump inhibitor empirically and Lovenox      subcu daily.  7. Code status full.  8. Disposition:  For home when  stabilized.      Corwin Levins, MD  Electronically Signed     JWJ/MEDQ  D:  07/27/2006  T:  07/28/2006  Job:  [id number]   cc:   Tera Mater. Clent Ridges, MD  [address] [place]  [place] [address]

## 2010-11-10 NOTE — Consult Note (Signed)
NAME:  [name], [name]               ACCOUNT NO.:  [id number]   MEDICAL RECORD NO.:  [id number]          PATIENT TYPE:  INP   LOCATION:  3002                         FACILITY:  [facility]   PHYSICIAN:  Casimiro Needle L. Reynolds, M.D.DATE OF BIRTH:  [birth year]   DATE OF CONSULTATION:  DATE OF DISCHARGE:                                 CONSULTATION   REQUESTING PHYSICIAN:  Dr. Willow Ora.   REASON FOR EVALUATION:  Possible TIA.   HISTORY OF PRESENT ILLNESS:  This is the initial inpatient consultation  and evaluation of this 55 year old woman with a past medical history  which includes hypertension, hypercholesterolemia and blindness.  The  patient was admitted to after presenting to the [facility] Emergency  Room with generalized weakness and particularly weakness in the right  face.  The patient says that she woke up at 2 o'clock with some numbness  and weakness on the right side of the face.  She also felt that her  mouth was weak and her speech was a little bit slurred.  She came to the  ER for a variety of symptoms, but happen to mention these symptoms, was  felt to have some right sided facial weakness and therefore was admitted  for possible cerebral vascular event.  The patient says she feels better  today.  She says that she had some left sided numbness involving the  face and possibly the arm and leg about a week ago without clear  associated weakness, and this resolved after about 1 day.  Other than  that she denies any known history stroke events.  She feels that she is  back to her baseline at this time.   PAST MEDICAL HISTORY:  1. She has a history of traumatic blindness and has a prosthetic left      eye.  2. She has a history of hypertension under fairly good control.  3. As well as hypercholesterolemia on medications.  4. She has a history of recurrent UTIs for which she sees Dr.      Retta Diones.  5. Other medical problems include gastroesophageal reflux disease.  6. Question  of Morphine syndrome.  7. Chronic constipation.  8. History of gout.  9. History of rheumatoid arthritis.  10.History of diverticulosis.  11.History of remote alcohol abuse.   FAMILY/SOCIAL/REVIEW OF SYSTEMS:  As outlined in the initial H&P by Dr.  Jonny Ruiz of 2208 which is reviewed.   MEDICATIONS:  Prior to admission her medications included:  1. Amitiza.  2. Clarinex.  3. Mobic.  4. Norvasc.  5. Nexium.  6. Keflex.  7. Cotranzine.  8. She had been on Zocor but was off it for about a week prior to      admission.   PHYSICAL EXAMINATION:  VITAL SIGNS:  Temperature 97.7.  Blood pressure  99/75.  Pulse 99.  Respirations 18.  O2 SAT 97% on room air.  GENERAL:  This is a thin but healthy-appearing supine in the hospital  bed in no distress.  HEAD:  Normocephalic, atraumatic.  Oropharynx benign.  NECK:  Supple without carotid or supraclavicular bruits.  HEART:  Regular without murmurs.  EXTREMITIES:  Stigmata of rheumatoid arthritis are noted in several  joints.  NEUROLOGIC EXAMINATION:  Mental status she is awake, alert.  She is  fully oriented to time, place and person.  Recent and remote memory are  intact.  She is able to follow commands and repeat phases without  difficulty.  Cranial nerves.  Left eye is prosthetic.  Right eye has  cornea clouding.  Full range of motion.  There is no visual perception.  Face and palate move normally and symmetrically.  Facial sensation  intact to light touch.  Motor:  Normal bulk and tone.  Does give way to  pain but seems to have diffusely normal strength throughout.  Sensation  intact to light touch and pin prick in all extremities.  Coordination  finger-to-nose performed adequately.  Gaze deferred.  Reflexes 2+  symmetric.  Toes are downgoing bilaterally.   LABORATORY REVIEW:  CT of the head performed yesterday in the [facility] is unremarkable except for generalized  atrophy.   Hemoglobin A1c 5.1.  Lipids this  morning normal with a HDL of 49 and LDL  of 76.  BUN from this morning normal except for slightly elevated  glucose of 103 and slightly low potassium of 3.4.  CBC from this morning  normal except for a slightly low hemoglobin of 10.1.   IMPRESSION:  1. Transient right sided facial weakness.  2. Questioned TIA.  Risk factors include hypertension,      hypercholesterolemia and tobacco abuse.   RECOMMENDATIONS:  Agree with aspirin a day.  Will check MRI, MRA,  Carotid Doppler and 2D echocardiograms.  Stroke service to follow up on  tests.      Michael L. Thad [place], M.D.  Electronically Signed     MLR/MEDQ  D:  07/28/2006  T:  07/29/2006  Job:  [id number]   cc:   Jeannett Senior A. Clent Ridges, MD

## 2010-11-10 NOTE — H&P (Signed)
NAME:  [name], [name] NO.:  [id number]   MEDICAL RECORD NO.:  [id number]                   PATIENT TYPE:  INP   LOCATION:  0453                                 FACILITY:  [facility]   PHYSICIAN:  Gordy Savers, M.D. [facility]      DATE OF BIRTH:  24-Oct-1955   DATE OF ADMISSION:  10/14/2002  DATE OF DISCHARGE:                                HISTORY & PHYSICAL   HISTORY OF PRESENT ILLNESS:  The patient is 55 year old black female with an  approximate one and one half week history of abdominal pain.  She has had  three ER admissions over this period of time.  She describes the pain as a  pins and needles sensation in the right upper quadrant that spreads to the  mid abdominal area.  She has had some associated nausea and vomiting.  She  states her bowel habits have been quite sluggish.  During the past week she  has had abdominal obstructive series that was unremarkable and also  abdominal CT scan was preformed that revealed no evidence of appendicitis or  diverticulitis.  The patient is now admitted for further evaluation and  treatment of her abdominal pain.   PAST MEDICAL HISTORY:  1. She states that she had similar abdominal pain in December of last year.     She had a normal  abdominal ultrasound.  2. She has a history of blindness since her teenage years and has a     prosthetic left eye.  The right eye apparently was traumatized and the     patient has been blind for most of her life.  3. Medical problems include hypertension.  4. Gastroesophageal reflux disease.  5. She is a gravida 1, para 1, aborted 0.  6. She is a former alcoholic but has been abstinent.  7. She is a one pack per day smoker.  8. She has been followed by urology due to chronic urinary tract infections     and has been on a suppressive dose of cephalosporin.  9. She apparently has Marfan syndrome and has had multiple orthopedic     procedures involving the feet.  10.      She  has also had a number of eye procedures.  11.      In 1990 she underwent knee surgery.  12.      In 1986 had neck surgery for an apparent fracture.   MEDICAL REGIMEN:  1. Bentyl.  2. Keflex.  3. Nexium.  4. Norvasc.  5. Vioxx.   SOCIAL HISTORY:  The patient is blind.  She lives alone.  She is a  recovering alcoholic.  She is a one pack per day smoker.  She does have a  daughter who lives in the [place] area but apparently they are estranged.   REVIEW OF SYSTEMS:  Otherwise fairly unremarkable except as mentioned in the  history of present illness.   PHYSICAL EXAMINATION:  GENERAL:  Revealed a Marfanoid black female with  extremely long extremities and long thin fingers.  There are multiple joint  abnormalities.  HEENT:  Revealed no evidence of head trauma.  The left eye was prosthetic.  She had a dense scarring involving the right cornea.  Ear, nose and throat  otherwise unremarkable.  NECK:  Revealed a large surgical scar on the posterior neck area.  CHEST:  Clear.  Some scoliosis was noted.  HEART:  Revealed normal  S1, S2.  No murmur, mitral regurgitation noted.  ABDOMEN:  Soft and nontender.  There is no organomegaly.  Bowel sounds are  active.  EXTREMITIES:  Revealed multiple joint abnormalities involving the small  joints of the hands.  Fingers were quite long and slender.  She had  considerable arthritic changes of the knees and the feet.  There is no  peripheral edema.   IMPRESSION:  1. Recurrent abdominal pain with a history of nausea and vomiting.     Unremarkable clinical examination.  2. Marfan syndrome with multiple joint abnormalities.  3. Blindness.  4. Hypertension.  5. History of gastroesophageal reflux disease.   DISPOSITION:  The patient will be admitted to the hospital.  Social services  will be consulted .  The patient has already had a very extensive work up  that has included a CT abdominal scan recently as well as extensive  laboratory studies.   An ESR and urine culture will be obtained. We will  check an abdominal ultrasound to exclude dissection of the aorta but her  symptoms do not really sound ischemic and this probably would have been  picked up on in the CT abdominal scan.  We will treat this symptomatically  and observe.                                               Gordy Savers, M.D. [facility]    PFK/MEDQ  D:  10/14/2002  T:  10/15/2002  Job:  [id number]

## 2010-11-10 NOTE — Discharge Summary (Signed)
NAME:  Christina, [name]               ACCOUNT NO.:  [id number]   MEDICAL RECORD NO.:  [id number]          PATIENT TYPE:  INP   LOCATION:  3002                         FACILITY:  [facility]   PHYSICIAN:  Raenette Rover. Felicity Coyer, MDDATE OF BIRTH:  03-31-1956   DATE OF ADMISSION:  07/28/2006  DATE OF DISCHARGE:  07/31/2006                               DISCHARGE SUMMARY   DISCHARGE DIAGNOSES:  1. Transient ischemic attack.  2. Hypertension,  over-controlled.  3. Chronic tobacco abuse.   HISTORY OF PRESENT ILLNESS:  [name] is a 55 year old African  American female who was admitted on July 27, 2006 with chief  complaint of right-sided facial numbness and weakness.  The patient has  a known history of Marfan's syndrome.  She was admitted for further  evaluation and treatment.   PAST MEDICAL HISTORY:  1. Prosthetic left eye.  2. Traumatic blindness right eye.  3. Hypertension.  4. Gastroesophageal reflux disease.  5. Remote alcohol abuse.  6. Recurrent UTI's on suppressive Keflex.  7. Marfan's syndrome.  8. Status post knee surgery 1990.  9. Status post C-spine surgery in 1986 secondary to fracture.  10.Chronic constipation.  11.ALLERGIES.  12.Degenerative joint disease.  13.History of gout.  14.Dyslipidemia.  15.Rheumatoid arthritis.  16.Diverticulosis.  17.History of hepatic cyst.  18.History of gallbladder polyp.   HOSPITAL COURSE:  PROBLEM #1.  TRANSIENT ISCHEMIC ATTACK:  The patient  was admitted and underwent a CT of the head performed on admission which  did note atrophy, however, showed no acute abnormality.  In addition, an  MRI was performed which showed chronic small vessel disease with no  acute abnormality, chronic sinusitis was noted.  She was seen in  consultation during this admission by Dr. Kelli Hope from  neurology.  It was recommended the patient be maintained on aspirin  therapy.  She has currently returned to her baseline.  She has no  further facial  drooping noted.  She is instructed to quit smoking.   PROBLEM #2.  HYPERTENSION:  The patient was noted to have a blood  pressure during this admission of 88 systolic on her home regimen of  Norvasc 10 mg and Mobic 7.5.  Her Norvasc was decreased to 5 mg p.o.  daily.  She will need follow-up of her blood pressure as an outpatient.   It was noted that the patient had been discontinued from Zocor secondary  to myalgias for one week prior to admission.  She was ordered for  Lipitor on admission, however, due to automatic pharmacy substitutions,  the pharmacy substituted Zocor.  She has been tolerating Zocor during  this admission, however, we will hold this until she follows up with Dr.  Clent Ridges for further Statin instructions.   MEDICATIONS AT TIME OF DISCHARGE:  1. Enteric-coated aspirin 325 mg p.o. daily.  2. Zocor 40 mg p.o. daily to be held until she sees Dr. Clent Ridges.  3. Nexium 40 mg p.o. daily.  4. Clarinex 5 mg p.o. daily.  5. Mobic 7.5 mg p.o. daily.  6. Keflex 250 mg p.o. daily.  7. Colchicine 0.6 mg p.o. b.i.d.  8. Norvasc 5 mg p.o. daily.  9. Amitiza 24 mg p.o. b.i.d.   DISPOSITION:  The patient will be discharged to home.  She will be sent  home with home health PT, OT and aide.   FOLLOWUP:  She is to follow up with Dr. Clent Ridges on Tuesday August 06, 2006  at 2:30 p.m. and is to contact Dr. Ferne Coe office for follow-up  appointment.  She is also instructed to call Dr. Clent Ridges  or go to the  emergency room should she develop recurrent facial weakness or numbness.      Sandford Craze, NP      Raenette Rover. Felicity Coyer, MD  Electronically Signed    MO/MEDQ  D:  07/31/2006  T:  07/31/2006  Job:  [id number]   cc:   Tera Mater. Clent Ridges, MD

## 2010-11-10 NOTE — Assessment & Plan Note (Signed)
[facility] OFFICE NOTE   NAME:[name], [name]                      MRN:          [id number]  DATE:08/20/2006                            DOB:          [birth year]    This is a 55 year old woman here for a preoperative surgical clearance  evaluation.  She has long history of Marfan's syndrome, also with  degenerative joint disease and a history of rheumatoid arthritis as  well.  Most of her joints are affected but she particularly deals with  chronic pains in the knees.  Dr. Turner Daniels has tentatively planned to do a  total knee replacement on the right side for August 29, 2006 and has asked  [place] to evaluate her preoperative risks.  Otherwise, I have been following  Lyla Son for a history of hypertension which has been fairly well managed,  hyperlipidemia and of GE reflux disease, all of which have been fairly  stable.  She was recently hospitalized at [facility] from  February 3 to July 31, 2006 for a self-limited episode of numbness  and weakness on the right side of her face which lasted about 2 days.  This was tentatively diagnosed as a transient ischemic attack, although  I am not so sure about this.  Certainly this could have represented a  mild episode of Bell's palsy as well.  Workup at that time involved  clear carotid arteries and a MRA scan of the brain showed only scattered  small vessel disease.  In any event, she was discharged on her usual  medications but was told to continue taking an 81 mg aspirin every day  which she has been doing.   OTHER PAST MEDICAL HISTORY:  1. Includes gout.  2. Chronic constipation.  3. Cervical spine surgery in 1986.  4. Remote history of heavy alcohol abuse (she has not consumed alcohol      in many years).  5. Traumatic blindness in the right eye, status post placement of a      prosthesis in the left eye.  6. She has a history of recurrent urinary tract  infections and is on      daily suppressive Keflex.  She sees Dr. Retta Diones for urologic      care.  She sees Dr. Myrlene Broker for gynecology care.   ALLERGIES:  None.   CURRENT MEDICATIONS:  1. Nexium 40 mg per day.  2. Norvasc 10 mg per day.  3. Clarinex 5 mg per day.  4. Mobic 15 mg per day.  5. Zocor 40 mg per day.  6. Amitiza 24 mcg b.i.d.  7. Cephalexin 250 mg per day.  8. Aspirin 81 mg per day.  9. Colchicine 0.6 mg b.i.d. as needed for gout.  10.Darvocet as needed for pain.   HABITS:  She does not use tobacco or alcohol.   SOCIAL HISTORY:  She lives alone, widowed and is disabled.  However, she  has family who is in constant contact with her.   OBJECTIVE:  Weight 136, temperature 97 degrees, pulse 80 and regular,  respirations 16 and  comfortable, blood pressure 122/84.  She is  accompanied in the clinic by her friend today since the patient cannot  drive.  Musculoskeletal exam is at her baseline.  She has long, thin  fingers and severely misshapen joints including MCPs, elbows, wrists,  hips, knees, etc.  She has a lot of scoliotic changes in the spine as  well.  Neck supple without lymphadenopathy or masses.  There are no  carotid bruits.  Lungs are clear with good air flow.  Cardiac rate and  rhythm regular without murmur, rub, or gallop.  Distal pulses are full.  Abdomen is soft, normal bowel sounds, nontender, no masses.  Extremities  show no edema.  Neurologic exam is grossly intact with the exception of  the blindness mentioned above.   EKG today shows sinus rhythm and is remarkable only for evidence of some  left atrial enlargement.  As noted above, during her recent  hospitalization, she had thorough laboratory testing all of which was  within normal limits.   ASSESSMENT/PLAN:  Preoperative evaluation for this patient who is  scheduled for a right total knee replacement per Dr. Turner Daniels.  I think  most of her medical problems are very well controlled including   hypertension as noted above.  I do not feel she needs full cardiac  stress testing to evaluate for cardiac ischemia at this time.  However,  with her history of Marfan's syndrome, she certainly is at risk for  deformity of the great vessels or for dilated cardiomyopathy.  Along  these lines, we will set her up for an echocardiogram to evaluate this  in the near future.  As long as this is reasonable, I do feel that she  would have very little surgical risk and could proceed with the surgery  planned above safely.     Tera Mater. Clent Ridges, MD  Electronically Signed    SAF/MedQ  DD: 08/20/2006  DT: 08/20/2006  Job #: [id number]

## 2010-11-27 ENCOUNTER — Other Ambulatory Visit: Payer: Self-pay | Admitting: Family Medicine

## 2010-11-28 NOTE — Telephone Encounter (Signed)
Call in #100 with 5 rf 

## 2010-12-01 NOTE — Telephone Encounter (Signed)
I approved this 3 days ago

## 2011-01-30 ENCOUNTER — Other Ambulatory Visit: Payer: Self-pay | Admitting: Family Medicine

## 2011-01-30 NOTE — Telephone Encounter (Signed)
Refill request for zyrtec - not on med list - please advise

## 2011-01-31 NOTE — Telephone Encounter (Signed)
This is OTC.

## 2011-02-01 ENCOUNTER — Telehealth: Payer: Self-pay | Admitting: Family Medicine

## 2011-02-01 NOTE — Telephone Encounter (Signed)
Spoke with pt and gave message

## 2011-02-01 NOTE — Telephone Encounter (Signed)
Pt requesting refill on zyrtec 10mg 

## 2011-02-01 NOTE — Telephone Encounter (Signed)
Spoke with pt, this medication is over the counter now, Dr. Clent Ridges does not write scripts for this.

## 2011-02-28 ENCOUNTER — Other Ambulatory Visit: Payer: Self-pay

## 2011-02-28 NOTE — Telephone Encounter (Signed)
rx request for hydrocodone-acetaminophen 5-500; pt last seen on 10/13/10; last filled on 01/30/11. Pls advise.

## 2011-03-01 NOTE — Telephone Encounter (Signed)
Pt called back again to check on status of refill for hydrocodone. Pls call in to [facility]. Pt says that this has to be called in by tomorrow a.m at the latest, in order for pharmacy to deliver med to pt.

## 2011-03-01 NOTE — Telephone Encounter (Signed)
She can take this 4 times a day prn pain. Call in #120 with 5 rf

## 2011-03-02 MED ORDER — HYDROCODONE-ACETAMINOPHEN 5-500 MG PO TABS: 1.0000 | ORAL_TABLET | Freq: Four times a day (QID) | ORAL | Status: DC | PRN

## 2011-03-02 NOTE — Telephone Encounter (Signed)
Script called in and left message for pt. 

## 2011-03-30 LAB — URINALYSIS, ROUTINE W REFLEX MICROSCOPIC
Bilirubin Urine: NEGATIVE
Glucose, UA: NEGATIVE
Hgb urine dipstick: NEGATIVE
Ketones, ur: NEGATIVE
Nitrite: NEGATIVE
Protein, ur: NEGATIVE
Specific Gravity, Urine: 1.007
Urobilinogen, UA: 0.2
pH: 6.5

## 2011-03-30 LAB — CBC
HCT: 37.9
Hemoglobin: 13.1
MCHC: 34.6
MCV: 89.1
Platelets: 242
RBC: 4.25
RDW: 15.3
WBC: 12.2 — ABNORMAL HIGH

## 2011-03-30 LAB — COMPREHENSIVE METABOLIC PANEL
ALT: 12
AST: 13
Albumin: 3.5
Alkaline Phosphatase: 77
BUN: 9
CO2: 28
Calcium: 8.7
Chloride: 102
Creatinine, Ser: 0.65
GFR calc Af Amer: >60
GFR calc non Af Amer: >60
Glucose, Bld: 91
Potassium: 3.8
Sodium: 137
Total Bilirubin: 0.6
Total Protein: 6.4

## 2011-03-30 LAB — POCT CARDIAC MARKERS
CKMB, poc: <1 — ABNORMAL LOW
CKMB, poc: <1 — ABNORMAL LOW
Myoglobin, poc: 44.3
Myoglobin, poc: 53.1
Operator id: 294341
Operator id: 294341
Troponin i, poc: <0.05
Troponin i, poc: <0.05

## 2011-03-30 LAB — URINE MICROSCOPIC-ADD ON

## 2011-03-30 LAB — DIFFERENTIAL
Basophils Absolute: 0.1
Basophils Relative: 1
Eosinophils Absolute: 0.1 — ABNORMAL LOW
Eosinophils Relative: 1
Lymphocytes Relative: 23
Lymphs Abs: 2.8
Monocytes Absolute: 0.7
Monocytes Relative: 6
Neutro Abs: 8.5 — ABNORMAL HIGH
Neutrophils Relative %: 70

## 2011-03-30 LAB — D-DIMER, QUANTITATIVE: D-Dimer, Quant: 9.65 — ABNORMAL HIGH

## 2011-03-30 LAB — LIPASE, BLOOD: Lipase: 34

## 2011-04-04 ENCOUNTER — Encounter: Payer: Self-pay | Admitting: Internal Medicine

## 2011-04-05 LAB — DIFFERENTIAL
Basophils Absolute: 0
Basophils Relative: 0
Eosinophils Absolute: 0
Eosinophils Relative: 0
Lymphocytes Relative: 22
Lymphs Abs: 2.4
Monocytes Absolute: 0.4
Monocytes Relative: 4
Neutro Abs: 8 — ABNORMAL HIGH
Neutrophils Relative %: 73

## 2011-04-05 LAB — GRAM STAIN

## 2011-04-05 LAB — CBC
HCT: 22.4 — ABNORMAL LOW
HCT: 23.4 — ABNORMAL LOW
HCT: 25.2 — ABNORMAL LOW
HCT: 29.2 — ABNORMAL LOW
HCT: 31.2 — ABNORMAL LOW
HCT: 40.8
Hemoglobin: 10.7 — ABNORMAL LOW
Hemoglobin: 13.5
Hemoglobin: 7.7 — CL
Hemoglobin: 7.9 — CL
Hemoglobin: 8.5 — ABNORMAL LOW
Hemoglobin: 9.8 — ABNORMAL LOW
MCHC: 33.1
MCHC: 33.5
MCHC: 33.6
MCHC: 33.9
MCHC: 34.2
MCHC: 34.2
MCV: 85.5
MCV: 86.2
MCV: 86.2
MCV: 86.2
MCV: 86.3
MCV: 86.6
Platelets: 181
Platelets: 187
Platelets: 208
Platelets: 268
Platelets: 279
Platelets: 288
RBC: 2.62 — ABNORMAL LOW
RBC: 2.71 — ABNORMAL LOW
RBC: 2.93 — ABNORMAL LOW
RBC: 3.37 — ABNORMAL LOW
RBC: 3.62 — ABNORMAL LOW
RBC: 4.73
RDW: 16.1 — ABNORMAL HIGH
RDW: 16.9 — ABNORMAL HIGH
RDW: 17.4 — ABNORMAL HIGH
RDW: 18 — ABNORMAL HIGH
RDW: 18.1 — ABNORMAL HIGH
RDW: 18.5 — ABNORMAL HIGH
WBC: 10.1
WBC: 10.9 — ABNORMAL HIGH
WBC: 8.1
WBC: 8.5
WBC: 9.2
WBC: 9.3

## 2011-04-05 LAB — PROTIME-INR
INR: 0.9
INR: 1
INR: 1.2
INR: 1.2
INR: 1.2
INR: 1.2
INR: 1.3
INR: 1.3
Prothrombin Time: 12.1
Prothrombin Time: 13.2
Prothrombin Time: 15
Prothrombin Time: 15.1
Prothrombin Time: 15.3 — ABNORMAL HIGH
Prothrombin Time: 15.6 — ABNORMAL HIGH
Prothrombin Time: 16 — ABNORMAL HIGH
Prothrombin Time: 16.4 — ABNORMAL HIGH

## 2011-04-05 LAB — BODY FLUID CULTURE: Culture: NO GROWTH

## 2011-04-05 LAB — TYPE AND SCREEN
ABO/RH(D): A POS
Antibody Screen: NEGATIVE

## 2011-04-05 LAB — COMPREHENSIVE METABOLIC PANEL
ALT: 14
AST: 14
Albumin: 3.2 — ABNORMAL LOW
Alkaline Phosphatase: 87
BUN: 8
CO2: 29
Calcium: 9.2
Chloride: 103
Creatinine, Ser: 0.55
GFR calc Af Amer: >60
GFR calc non Af Amer: >60
Glucose, Bld: 85
Potassium: 4
Sodium: 138
Total Bilirubin: 0.7
Total Protein: 7.4

## 2011-04-05 LAB — CROSSMATCH
ABO/RH(D): A POS
Antibody Screen: NEGATIVE

## 2011-04-05 LAB — URINALYSIS, ROUTINE W REFLEX MICROSCOPIC
Bilirubin Urine: NEGATIVE
Glucose, UA: NEGATIVE
Hgb urine dipstick: NEGATIVE
Ketones, ur: NEGATIVE
Nitrite: NEGATIVE
Protein, ur: NEGATIVE
Specific Gravity, Urine: 1.011
Urobilinogen, UA: 1
pH: 7

## 2011-04-05 LAB — APTT: aPTT: 28

## 2011-04-05 LAB — ANAEROBIC CULTURE

## 2011-04-11 LAB — HEMOGLOBIN AND HEMATOCRIT, BLOOD
HCT: 27.6 — ABNORMAL LOW
HCT: 27.8 — ABNORMAL LOW
Hemoglobin: 9.1 — ABNORMAL LOW
Hemoglobin: 9.1 — ABNORMAL LOW

## 2011-04-11 LAB — PROTIME-INR
INR: 1.4
INR: 1.7 — ABNORMAL HIGH
INR: 2 — ABNORMAL HIGH
Prothrombin Time: 18.1 — ABNORMAL HIGH
Prothrombin Time: 20.9 — ABNORMAL HIGH
Prothrombin Time: 23.6 — ABNORMAL HIGH

## 2011-04-12 LAB — CBC
HCT: 21.2 — ABNORMAL LOW
HCT: 26.3 — ABNORMAL LOW
HCT: 28.6 — ABNORMAL LOW
HCT: 30.6 — ABNORMAL LOW
Hemoglobin: 10.1 — ABNORMAL LOW
Hemoglobin: 7.2 — CL
Hemoglobin: 8.6 — ABNORMAL LOW
Hemoglobin: 9.6 — ABNORMAL LOW
MCHC: 32.7
MCHC: 33.2
MCHC: 33.4
MCHC: 33.9
MCV: 82.7
MCV: 82.8
MCV: 84.6
MCV: 85
Platelets: 292
Platelets: 295
Platelets: 415 — ABNORMAL HIGH
Platelets: 422 — ABNORMAL HIGH
RBC: 2.56 — ABNORMAL LOW
RBC: 3.1 — ABNORMAL LOW
RBC: 3.37 — ABNORMAL LOW
RBC: 3.69 — ABNORMAL LOW
RDW: 16.5 — ABNORMAL HIGH
RDW: 16.9 — ABNORMAL HIGH
RDW: 17 — ABNORMAL HIGH
RDW: 17.2 — ABNORMAL HIGH
WBC: 10.4
WBC: 6
WBC: 7.9
WBC: 8.9

## 2011-04-12 LAB — COMPREHENSIVE METABOLIC PANEL
ALT: 8
AST: 13
Albumin: 2.7 — ABNORMAL LOW
Alkaline Phosphatase: 94
BUN: 4 — ABNORMAL LOW
CO2: 26
Calcium: 8.9
Chloride: 105
Creatinine, Ser: 0.58
GFR calc Af Amer: >60
GFR calc non Af Amer: >60
Glucose, Bld: 100 — ABNORMAL HIGH
Potassium: 4.2
Sodium: 140
Total Bilirubin: 0.6
Total Protein: 6.8

## 2011-04-12 LAB — CROSSMATCH
ABO/RH(D): A POS
Antibody Screen: NEGATIVE

## 2011-04-12 LAB — PROTIME-INR
INR: 1.1
INR: 1.1
INR: 1.3
INR: 1.3
Prothrombin Time: 14
Prothrombin Time: 14
Prothrombin Time: 16.2 — ABNORMAL HIGH
Prothrombin Time: 16.3 — ABNORMAL HIGH

## 2011-04-12 LAB — APTT: aPTT: 31

## 2011-04-12 LAB — DIFFERENTIAL
Basophils Absolute: 0
Basophils Relative: 0
Eosinophils Absolute: 0.1
Eosinophils Relative: 2
Lymphocytes Relative: 20
Lymphs Abs: 1.2
Monocytes Absolute: 0.5
Monocytes Relative: 8
Neutro Abs: 4.2
Neutrophils Relative %: 71

## 2011-04-12 LAB — ABO/RH: ABO/RH(D): A POS

## 2011-04-12 LAB — URINALYSIS, ROUTINE W REFLEX MICROSCOPIC
Bilirubin Urine: NEGATIVE
Glucose, UA: NEGATIVE
Hgb urine dipstick: NEGATIVE
Ketones, ur: NEGATIVE
Leukocytes, UA: NEGATIVE
Nitrite: NEGATIVE
Protein, ur: NEGATIVE
Specific Gravity, Urine: 1.012 (ref 1.005–1.035)
Urobilinogen, UA: 2 — ABNORMAL HIGH
pH: 7

## 2011-04-12 LAB — TYPE AND SCREEN
ABO/RH(D): A POS
Antibody Screen: NEGATIVE

## 2011-04-12 LAB — PREPARE RBC (CROSSMATCH)

## 2011-04-17 ENCOUNTER — Telehealth: Payer: Self-pay | Admitting: *Deleted

## 2011-04-17 NOTE — Telephone Encounter (Signed)
Patient has paperwork that needs to be completed for her Transportation services-can she fax paperwork or does she need OV.?

## 2011-04-19 NOTE — Telephone Encounter (Signed)
Spoke with pt and she is going to schedule a office visit. 

## 2011-04-19 NOTE — Telephone Encounter (Signed)
Make an OV for this

## 2011-04-30 ENCOUNTER — Ambulatory Visit (INDEPENDENT_AMBULATORY_CARE_PROVIDER_SITE_OTHER): Payer: Medicare Other | Admitting: Family Medicine

## 2011-04-30 ENCOUNTER — Encounter: Payer: Self-pay | Admitting: Family Medicine

## 2011-04-30 VITALS — BP 144/82 | HR 105 | Temp 98.7°F

## 2011-04-30 DIAGNOSIS — S93609A Unspecified sprain of unspecified foot, initial encounter: Secondary | ICD-10-CM

## 2011-04-30 DIAGNOSIS — Z23 Encounter for immunization: Secondary | ICD-10-CM

## 2011-04-30 MED ORDER — CETIRIZINE HCL 10 MG PO TABS: 10.0000 mg | ORAL_TABLET | Freq: Every day | ORAL | Status: DC

## 2011-04-30 NOTE — Progress Notes (Signed)
  Subjective:    Patient ID: [name], female    DOB: 12/28/1955, 55 y.o.   MRN: [id number]  HPI Here for some paper work to fill out so she can ride the [employer] bus, but also to check her right foot. She fell at home about 5 days ago when her knee buckled under her. Her only injury was to the right foot, when she landed on it. The foot and toes have been swollen and painful. The ankle is okay.    Review of Systems  Constitutional: Negative.   Respiratory: Negative.   Cardiovascular: Negative.   Musculoskeletal: Positive for joint swelling.       Objective:   Physical Exam  Constitutional: She appears well-developed and well-nourished.  Musculoskeletal:       The right forefoot is red, swollen, and tender           Assessment & Plan:  I am concerned about a possible metatarsal fracture, so we will send her for Xrays.

## 2011-05-01 ENCOUNTER — Ambulatory Visit (INDEPENDENT_AMBULATORY_CARE_PROVIDER_SITE_OTHER)
Admission: RE | Admit: 2011-05-01 | Discharge: 2011-05-01 | Disposition: A | Discharge status: Home / Self Care | Payer: Medicare Other | Source: Ambulatory Visit | Attending: Family Medicine | Admitting: Family Medicine

## 2011-05-01 DIAGNOSIS — S93609A Unspecified sprain of unspecified foot, initial encounter: Secondary | ICD-10-CM

## 2011-05-01 NOTE — Progress Notes (Signed)
Addended by: Gershon Crane A on: 05/01/2011 12:21 PM   Modules accepted: Orders

## 2011-05-01 NOTE — Progress Notes (Signed)
  Subjective:    Patient ID: [name], female    DOB: 1956/03/09, 55 y.o.   MRN: [id number]  HPI    Review of Systems     Objective:   Physical Exam        Assessment & Plan:

## 2011-05-04 ENCOUNTER — Telehealth: Payer: Self-pay | Admitting: *Deleted

## 2011-05-04 MED ORDER — AMLODIPINE BESYLATE 10 MG PO TABS: 10.0000 mg | ORAL_TABLET | Freq: Every day | ORAL | Status: DC

## 2011-05-04 NOTE — Telephone Encounter (Signed)
rx sent in electronically 

## 2011-05-04 NOTE — Telephone Encounter (Signed)
Refill request AMLODIPINE BESYLATE 10MG  TAB #30 X 6  [facility]

## 2011-05-04 NOTE — Telephone Encounter (Signed)
Pt called to check on status of refill for Amlodipine. Pt has been notified that script was sent in electronically.

## 2011-05-31 ENCOUNTER — Telehealth: Payer: Self-pay | Admitting: Family Medicine

## 2011-05-31 MED ORDER — RISEDRONATE SODIUM 150 MG PO TABS: 150.0000 mg | ORAL_TABLET | ORAL | Status: DC

## 2011-05-31 MED ORDER — CYCLOBENZAPRINE HCL 10 MG PO TABS: 10.0000 mg | ORAL_TABLET | Freq: Three times a day (TID) | ORAL | Status: DC | PRN

## 2011-05-31 NOTE — Telephone Encounter (Signed)
Script sent e-scribe, approved by Dr. Clent Ridges.

## 2011-06-28 ENCOUNTER — Other Ambulatory Visit: Payer: Self-pay | Admitting: Family Medicine

## 2011-06-28 ENCOUNTER — Other Ambulatory Visit: Payer: Self-pay | Admitting: Internal Medicine

## 2011-06-29 ENCOUNTER — Other Ambulatory Visit: Payer: Self-pay | Admitting: *Deleted

## 2011-06-29 MED ORDER — LUBIPROSTONE 24 MCG PO CAPS: 24.0000 ug | ORAL_CAPSULE | Freq: Two times a day (BID) | ORAL | Status: DC

## 2011-07-19 ENCOUNTER — Telehealth: Payer: Self-pay | Admitting: Family Medicine

## 2011-07-19 NOTE — Telephone Encounter (Signed)
Left voice message for bone density scan, shows osteoporosis is stable.

## 2011-07-26 ENCOUNTER — Encounter: Payer: Self-pay | Admitting: Family Medicine

## 2011-07-30 ENCOUNTER — Other Ambulatory Visit: Payer: Self-pay | Admitting: Family Medicine

## 2011-07-30 NOTE — Telephone Encounter (Signed)
Last OV 04/30/11. Last filled 08/03/10

## 2011-08-01 NOTE — Telephone Encounter (Signed)
Call in one year supply  

## 2011-08-10 ENCOUNTER — Encounter: Payer: Self-pay | Admitting: Family Medicine

## 2011-08-17 ENCOUNTER — Ambulatory Visit: Payer: Medicare Other | Admitting: Family Medicine

## 2011-08-28 ENCOUNTER — Other Ambulatory Visit: Payer: Self-pay

## 2011-08-28 NOTE — Telephone Encounter (Signed)
Rx request for hydcodone-acetaminophen 5-500.  Rx last filled 03/02/11 #120 x 5 rf.  Pt last seen 04/30/11.  Pls advise.

## 2011-08-29 MED ORDER — HYDROCODONE-ACETAMINOPHEN 5-500 MG PO TABS: 1.0000 | ORAL_TABLET | Freq: Four times a day (QID) | ORAL | Status: DC | PRN

## 2011-08-29 NOTE — Telephone Encounter (Signed)
Script called in

## 2011-08-29 NOTE — Telephone Encounter (Signed)
Call in #120 with 5 rf 

## 2011-08-29 NOTE — Telephone Encounter (Signed)
Addended by: Aniceto Boss A on: 08/29/2011 04:19 PM   Modules accepted: Orders

## 2011-10-08 LAB — HM PAP SMEAR: HM Pap smear: NEGATIVE

## 2011-10-24 ENCOUNTER — Other Ambulatory Visit: Payer: Self-pay | Admitting: Family Medicine

## 2011-11-26 ENCOUNTER — Other Ambulatory Visit: Payer: Self-pay | Admitting: Family Medicine

## 2011-11-27 NOTE — Telephone Encounter (Signed)
Call in Tramadol #120 with 5rf, also Norvasc #30 with 11 rf

## 2011-12-24 ENCOUNTER — Other Ambulatory Visit: Payer: Self-pay | Admitting: Family Medicine

## 2012-01-28 ENCOUNTER — Other Ambulatory Visit: Payer: Self-pay | Admitting: Family Medicine

## 2012-02-27 ENCOUNTER — Encounter: Payer: Self-pay | Admitting: Family Medicine

## 2012-02-27 ENCOUNTER — Ambulatory Visit (INDEPENDENT_AMBULATORY_CARE_PROVIDER_SITE_OTHER): Payer: Medicare Other | Admitting: Family Medicine

## 2012-02-27 ENCOUNTER — Telehealth: Payer: Self-pay | Admitting: Family Medicine

## 2012-02-27 VITALS — BP 140/80 | HR 117 | Temp 98.3°F | Wt 136.0 lb

## 2012-02-27 DIAGNOSIS — J40 Bronchitis, not specified as acute or chronic: Secondary | ICD-10-CM

## 2012-02-27 MED ORDER — PREDNISONE 20 MG PO TABS: 20.0000 mg | ORAL_TABLET | Freq: Every day | ORAL | Status: AC

## 2012-02-27 MED ORDER — ALBUTEROL SULFATE HFA 108 (90 BASE) MCG/ACT IN AERS: 2.0000 | INHALATION_SPRAY | RESPIRATORY_TRACT | Status: DC | PRN

## 2012-02-27 MED ORDER — AZITHROMYCIN 250 MG PO TABS: ORAL_TABLET | ORAL | Status: AC

## 2012-02-27 NOTE — Telephone Encounter (Signed)
Refill request for Vicodin 5-500 mg take 1 po q6hrs prn.

## 2012-02-27 NOTE — Progress Notes (Signed)
  Subjective:    Patient ID: [name], female    DOB: Nov 26, 1955, 56 y.o.   MRN: [id number]  HPI Here for one week of chest tightness and a dry cough. No chest pain or fever. Using Mucinex.    Review of Systems  Constitutional: Negative.   HENT: Negative.   Eyes: Negative.   Respiratory: Positive for cough, chest tightness, shortness of breath and wheezing.   Cardiovascular: Negative.        Objective:   Physical Exam  Constitutional: She appears well-developed and well-nourished.  HENT:  Right Ear: External ear normal.  Left Ear: External ear normal.  Nose: Nose normal.  Mouth/Throat: Oropharynx is clear and moist.  Eyes: Conjunctivae are normal.  Neck: No thyromegaly present.  Pulmonary/Chest: Effort normal. No respiratory distress. She has no rales.       Scattered rhonchi and wheezes   Lymphadenopathy:    She has no cervical adenopathy.          Assessment & Plan:  Add an inhaler prn.

## 2012-02-28 MED ORDER — HYDROCODONE-ACETAMINOPHEN 5-500 MG PO TABS: 1.0000 | ORAL_TABLET | Freq: Four times a day (QID) | ORAL | Status: DC | PRN

## 2012-02-28 NOTE — Telephone Encounter (Signed)
Call in #120 with 5 rf 

## 2012-02-28 NOTE — Telephone Encounter (Signed)
I called in script, can you remind pt to call us about 3 days before they need the refill.

## 2012-03-19 ENCOUNTER — Encounter: Payer: Self-pay | Admitting: Internal Medicine

## 2012-04-16 ENCOUNTER — Ambulatory Visit: Payer: Medicare Other | Admitting: Family Medicine

## 2012-04-16 ENCOUNTER — Ambulatory Visit (INDEPENDENT_AMBULATORY_CARE_PROVIDER_SITE_OTHER): Payer: Medicare Other | Admitting: Family Medicine

## 2012-04-16 DIAGNOSIS — Z23 Encounter for immunization: Secondary | ICD-10-CM

## 2012-04-30 ENCOUNTER — Other Ambulatory Visit: Payer: Self-pay | Admitting: Family Medicine

## 2012-06-02 ENCOUNTER — Other Ambulatory Visit: Payer: Self-pay | Admitting: Family Medicine

## 2012-06-04 NOTE — Telephone Encounter (Signed)
Pt following up on request for refills.

## 2012-06-05 NOTE — Telephone Encounter (Signed)
Refill all these for one year  

## 2012-08-01 ENCOUNTER — Encounter: Payer: Self-pay | Admitting: Family Medicine

## 2012-08-19 ENCOUNTER — Telehealth: Payer: Self-pay | Admitting: Family Medicine

## 2012-08-19 NOTE — Telephone Encounter (Signed)
Patient notified no samples in the office.  Would you like to recommend something OTC she can take?

## 2012-08-19 NOTE — Telephone Encounter (Signed)
Has lost Nexium 40mg  that takes daily and insurance will not pay for more until 3-3. IS wanting to know if office has samples she can pick up until can get script refilled in March. PLEASE CALL PATIENT AND ADVISE IF SAMPLES AVAILABLE. [phone].

## 2012-08-20 NOTE — Telephone Encounter (Signed)
Tell her to use Prilosec OTC but to take two pills daily

## 2012-08-20 NOTE — Telephone Encounter (Signed)
Patient notified by telephone. 

## 2012-08-25 ENCOUNTER — Telehealth: Payer: Self-pay | Admitting: Family Medicine

## 2012-08-25 NOTE — Telephone Encounter (Signed)
Refill request for Vicodin and change dose. 

## 2012-08-26 MED ORDER — HYDROCODONE-ACETAMINOPHEN 5-325 MG PO TABS: 1.0000 | ORAL_TABLET | Freq: Four times a day (QID) | ORAL | Status: DC | PRN

## 2012-08-26 NOTE — Telephone Encounter (Signed)
I called in script 

## 2012-08-26 NOTE — Telephone Encounter (Signed)
Change Vicodin to 5/325 to take q 6 hours prn pain, cal in  #120 with 5 rf

## 2012-09-16 ENCOUNTER — Telehealth: Payer: Self-pay | Admitting: Family Medicine

## 2012-09-16 NOTE — Telephone Encounter (Signed)
TC to patient regarding medication question.  On callback, unable to reach patient; message left on voicemail to call office.  krs/can

## 2012-09-16 NOTE — Telephone Encounter (Signed)
Yes that would be okay

## 2012-09-16 NOTE — Telephone Encounter (Signed)
Pt would like to know if she could take biotin and fish oil tablets along with her regular prescribed medications.  Pls advise.

## 2012-09-16 NOTE — Telephone Encounter (Signed)
I spoke with pt  

## 2012-10-07 ENCOUNTER — Encounter: Payer: Self-pay | Admitting: *Deleted

## 2012-10-08 ENCOUNTER — Ambulatory Visit: Payer: Self-pay | Admitting: Certified Nurse Midwife

## 2012-10-08 ENCOUNTER — Ambulatory Visit (INDEPENDENT_AMBULATORY_CARE_PROVIDER_SITE_OTHER): Payer: Medicare Other | Admitting: Certified Nurse Midwife

## 2012-10-08 ENCOUNTER — Encounter: Payer: Self-pay | Admitting: Certified Nurse Midwife

## 2012-10-08 VITALS — BP 124/90 | Ht 68.5 in | Wt 142.0 lb

## 2012-10-08 DIAGNOSIS — Z01419 Encounter for gynecological examination (general) (routine) without abnormal findings: Secondary | ICD-10-CM

## 2012-10-08 DIAGNOSIS — M899 Disorder of bone, unspecified: Secondary | ICD-10-CM

## 2012-10-08 DIAGNOSIS — M949 Disorder of cartilage, unspecified: Secondary | ICD-10-CM

## 2012-10-08 DIAGNOSIS — M8580 Other specified disorders of bone density and structure, unspecified site: Secondary | ICD-10-CM

## 2012-10-08 NOTE — Patient Instructions (Addendum)

## 2012-10-08 NOTE — Progress Notes (Signed)
57 y.o. Single African American female   G1P1001 here for annual exam. Menopausal no HRT.  Denies any vaginal bleeding or vaginal dryness.  She has a full time aide that helps her in addition to her daughter checking on her daily.  Still sexually active, with "only partner for years" Denies STD concerns or testing desired.  Occasional hot flash, no problems with.  No surgeries this year!  Having difficulty with right leg giving way, so when walking wears knee and leg brace. Hypertension still stable on medication.  Weight loss over the past few months, but eating better.  Aware her colonoscopy is due, daughter to help with scheduling.  Denies any rectal bleeding or stool change per aide to patient. Sees PCP, aex, labs, medication management, Sees Rheumatologist for osteoporosis management, gout .  Still smoking and enjoying it! "the only real thing that I enjoy"  No change in Marfans's progression per patient. Hearing unchanged still using hearing aids.  Eye exam no changes.     Patient's last menstrual period was 06/25/2006.          Sexually active: yes  The current method of family planning is none.    Exercising: yes  walking Last mammogram: 07-22-12 Last pap: 10-08-11 neg Last BMD: 07-23-11 Alcohol: none Tobacco: none Colonoscopy: 2003   Health Maintenance  Topic Date Due  . Colonoscopy  03/13/2006  . Influenza Vaccine  02/23/2013  . Mammogram  07/21/2014  . Pap Smear  10/08/2014  . Tetanus/tdap  09/03/2020    History reviewed. No pertinent family history.  Patient Active Problem List  Diagnosis  . THYROID NODULE  . HYPERLIPIDEMIA  . GOUTY ARTHROPATHY  . ABUSE, ALCOHOL, IN REMISSION  . BLINDNESS, BILATERAL  . OTITIS EXTERNA  . HYPERTENSION  . HEMORRHOIDS, INTERNAL  . LUNG NODULE  . GERD  . CELLULITIS AND ABSCESS OF LEG EXCEPT FOOT  . RHEUMATOID ARTHRITIS  . OSTEOARTHRITIS  . OSTEOPOROSIS  . MARFAN'S SYNDROME  . LEG EDEMA  . HEMATOMA  . ALLERGIC REACTION    Past  Medical History  Diagnosis Date  . Legally blind     since pt was a teenager  . Hearing aid worn     pt wears bilateral hearing aids  . Marfan's syndrome affecting skin     with scolosis  . Osteoporosis   . Hypertension   . Substance abuse     recovering alcoholic x12 years   . Hernia 4/08  . 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 Chronic gout 2008  . Stroke 2/08  . Fibroid   . Status post bunionectomy 1/10    bilateral     Past Surgical History  Procedure Laterality Date  . Bunionectomy Bilateral 1/10  . Total knee arthroplasty Bilateral 6/08    Allergies: Review of patient's allergies indicates no known allergies.  Current Outpatient Prescriptions  Medication Sig Dispense Refill  . ACTONEL 150 MG tablet TAKE 1 TABLET BY MOUTH EVERY 30 DAYS    WITH WATER ON EMPTY STOMACH, NOTHING BY MOUTH OR LIE DOWN FOR NEXT 30 MINUTES  1 tablet  11  . albuterol (PROVENTIL HFA;VENTOLIN HFA) 108 (90 BASE) MCG/ACT inhaler Inhale 2 puffs into the lungs every 4 (four) hours as needed for wheezing or shortness of breath.  1 Inhaler  5  . allopurinol (ZYLOPRIM) 100 MG tablet TAKE ONE TABLET BY MOUTH ONCE DAILY  30 tablet  11  . AMITIZA 24 MCG capsule TAKE ONE CAPSULE  TWICE A DAY WITH A MEAL  60 capsule  11  . amLODipine (NORVASC) 10 MG tablet TAKE ONE (1) TABLET EACH DAY  30 tablet  11  . Calcium Carbonate-Vitamin D (CALCIUM + D PO) Take 1,200 mg by mouth daily.      . cetirizine (ZYRTEC) 10 MG tablet TAKE ONE (1) TABLET EACH DAY  30 tablet  11  . colchicine 0.6 MG tablet Take 0.6 mg by mouth 2 (two) times daily.      . cyclobenzaprine (FLEXERIL) 10 MG tablet TAKE ONE (1) TABLET THREE (3) TIMES     A DAY IF NEEDED  270 tablet  11  . docusate sodium (COLACE) 100 MG capsule Take 100 mg by mouth 2 (two) times daily.        . fexofenadine (ALLEGRA) 180 MG tablet Take 180 mg by mouth daily.      Marland Kitchen HYDROcodone-acetaminophen (NORCO/VICODIN) 5-325 MG per tablet Take 1 tablet by mouth every  6 (six) hours as needed for pain.  120 tablet  5  . Multiple Vitamin (MULTIVITAMIN) tablet Take 1 tablet by mouth daily.        . Multiple Vitamins-Minerals (MULTIVITAMIN PO) Take by mouth daily.      Marland Kitchen NEXIUM 40 MG capsule TAKE ONE (1) CAPSULE EACH DAY  30 capsule  11  . predniSONE (DELTASONE) 20 MG tablet       . traMADol (ULTRAM) 50 MG tablet TAKE 1 TO 2 TABLETS EVERY 6 HOURS AS    NEEDED FOR PAIN.                                                  GENERIC FOR ULTRAM  120 tablet  5  . Vitamin D, Ergocalciferol, (DRISDOL) 50000 UNITS CAPS Take 50,000 Units by mouth every 14 (fourteen) days.       Current Facility-Administered Medications  Medication Dose Route Frequency Provider Last Rate Last Dose  . TDaP (BOOSTRIX) injection 0.5 mL  0.5 mL Intramuscular Once Nelwyn Salisbury, MD        ROS: Pertinent items are noted in HPI.  Exam:    BP 124/90  Ht 5' 8.5" (1.74 m)  Wt 142 lb (64.411 kg)  BMI 21.27 kg/m2  LMP 06/25/2006 Weight change: @WEIGHTCHANGE @ Last 3 height recordings:  Ht Readings from Last 3 Encounters:  10/08/12 5' 8.5" (1.74 m)  05/30/09 5\' 9"  (1.753 m)  01/08/08 5\' 9"  (1.753 m)   General appearance: alert and cooperative Shaded glasses due to blindness, obvious skeletal deformities Head: Normocephalic, without obvious abnormality, atraumatic Neck: no adenopathy, supple, symmetrical, trachea midline and thyroid not enlarged, symmetric, no tenderness/mass/nodules Lungs: clear to auscultation bilaterally and occasional wheeze chest obvious deformity with prominent rib cage Breasts: normal appearance, no masses or tenderness, pendulous Heart: regular rate and rhythm Abdomen: soft, non-tender; bowel sounds normal; no masses,  no organomegaly and very loose skin noted due to weight loss Extremities: Deformed feet and hands and fingers noted Skin: dry with scaling areas no rash or lesions noted Lymph nodes: Cervical, supraclavicular, and axillary nodes normal. no inguinal nodes  palpated Neurologic: Mental status: Alert, oriented, thought content appropriate Gait: walks with cane, feet turn outward   Pelvic: External genitalia:  no lesions              Urethra: normal appearing urethra with no  masses, tenderness or lesions              Bartholins and Skenes: Bartholin's, Urethra, Skene's normal                 Vagina: normal appearing vagina , no lesions, atrophic moist              Cervix: normal appearance              Pap taken: no        Bimanual Exam:  Uterus:  uterus is normal size, shape, consistency and nontender                                      Adnexa:    normal adnexa in size, nontender and no masses                                      Rectovaginal: Confirms                                      Anus:  normal sphincter tone, no lesions  A: Normal Gyn exam Menopausal no HRT Marfans Syndrome with scolosis Hypertension on stable medication Atrophic vaginitis uses OTC moisturizer Legally blind Bilateral hearing loss with hearing aids History of Vitamin D deficiency  P: Reviewed health and wellness pertinent to exam. Stressed importance of assistance use as needed for her care. Mammogram yearly, pap smear as indicated, keep appt. For colonoscopy Discussed importance of aide notifying patient if any vaginal bleeding noted Continue OTC for vaginal dryness Continue follow up with MD regarding other health problems Lab VIt. D  return annually or prn      An After Visit Summary was printed and given to the patient.  Reviewed, TL

## 2012-10-09 LAB — VITAMIN D 25 HYDROXY (VIT D DEFICIENCY, FRACTURES): Vit D, 25-Hydroxy: 63 ng/mL (ref 30–89)

## 2012-10-12 ENCOUNTER — Emergency Department (HOSPITAL_COMMUNITY)
Admission: EM | Admit: 2012-10-12 | Discharge: 2012-10-12 | Disposition: A | Discharge status: Home / Self Care | Payer: Medicare Other | Attending: Emergency Medicine | Admitting: Emergency Medicine

## 2012-10-12 ENCOUNTER — Encounter (HOSPITAL_COMMUNITY): Payer: Self-pay | Admitting: Emergency Medicine

## 2012-10-12 DIAGNOSIS — Z8719 Personal history of other diseases of the digestive system: Secondary | ICD-10-CM | POA: Insufficient documentation

## 2012-10-12 DIAGNOSIS — H543 Unqualified visual loss, both eyes: Secondary | ICD-10-CM | POA: Insufficient documentation

## 2012-10-12 DIAGNOSIS — F1910 Other psychoactive substance abuse, uncomplicated: Secondary | ICD-10-CM | POA: Insufficient documentation

## 2012-10-12 DIAGNOSIS — I10 Essential (primary) hypertension: Secondary | ICD-10-CM | POA: Insufficient documentation

## 2012-10-12 DIAGNOSIS — M1A9XX0 Chronic gout, unspecified, without tophus (tophi): Secondary | ICD-10-CM | POA: Insufficient documentation

## 2012-10-12 DIAGNOSIS — Z96659 Presence of unspecified artificial knee joint: Secondary | ICD-10-CM | POA: Insufficient documentation

## 2012-10-12 DIAGNOSIS — H60399 Other infective otitis externa, unspecified ear: Secondary | ICD-10-CM | POA: Insufficient documentation

## 2012-10-12 DIAGNOSIS — Z79899 Other long term (current) drug therapy: Secondary | ICD-10-CM | POA: Insufficient documentation

## 2012-10-12 DIAGNOSIS — H9209 Otalgia, unspecified ear: Secondary | ICD-10-CM | POA: Insufficient documentation

## 2012-10-12 DIAGNOSIS — Z9889 Other specified postprocedural states: Secondary | ICD-10-CM | POA: Insufficient documentation

## 2012-10-12 DIAGNOSIS — F17200 Nicotine dependence, unspecified, uncomplicated: Secondary | ICD-10-CM | POA: Insufficient documentation

## 2012-10-12 DIAGNOSIS — Z87798 Personal history of other (corrected) congenital malformations: Secondary | ICD-10-CM | POA: Insufficient documentation

## 2012-10-12 DIAGNOSIS — Z8669 Personal history of other diseases of the nervous system and sense organs: Secondary | ICD-10-CM | POA: Insufficient documentation

## 2012-10-12 DIAGNOSIS — H6092 Unspecified otitis externa, left ear: Secondary | ICD-10-CM

## 2012-10-12 DIAGNOSIS — Z8739 Personal history of other diseases of the musculoskeletal system and connective tissue: Secondary | ICD-10-CM | POA: Insufficient documentation

## 2012-10-12 DIAGNOSIS — Z8673 Personal history of transient ischemic attack (TIA), and cerebral infarction without residual deficits: Secondary | ICD-10-CM | POA: Insufficient documentation

## 2012-10-12 DIAGNOSIS — Z8742 Personal history of other diseases of the female genital tract: Secondary | ICD-10-CM | POA: Insufficient documentation

## 2012-10-12 DIAGNOSIS — M1A00X0 Idiopathic chronic gout, unspecified site, without tophus (tophi): Secondary | ICD-10-CM | POA: Insufficient documentation

## 2012-10-12 MED ORDER — CIPROFLOXACIN-DEXAMETHASONE 0.3-0.1 % OT SUSP: 4.0000 [drp] | Freq: Two times a day (BID) | OTIC | Status: DC

## 2012-10-12 NOTE — ED Notes (Signed)
Pt states that her left ear has been draining "real bad" since last night.  States she has a hole in her ear drum.  Clear drainage.

## 2012-10-12 NOTE — ED Provider Notes (Signed)
History     CSN: [id number]  Arrival date & time 10/12/12  0902   First MD Initiated Contact with Patient 10/12/12 0920      Chief Complaint  Patient presents with  . Ear Drainage    (Consider location/radiation/quality/duration/timing/severity/associated sxs/prior treatment) HPI This 57 year old female has chronic left ear drainage everyday for years but has increased drainage of some left ear pain for last couple of days with no fever no altered mental status no headache no stiff neck no rash no chest pain no shortness breath no vomiting no abdominal pain no other concerns. There is no treatment prior to arrival. She usually gets antibiotic eardrops when this occurs. Past Medical History  Diagnosis Date  . Legally blind     since pt was a teenager  . Hearing aid worn     pt wears bilateral hearing aids  . Marfan's syndrome affecting skin     with scolosis  . Osteoporosis   . Hypertension   . Substance abuse     recovering alcoholic x12 years   . Hernia 4/08  . 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 Chronic gout 2008  . Stroke 2/08  . Fibroid   . Status post bunionectomy 1/10    bilateral     Past Surgical History  Procedure Laterality Date  . Bunionectomy Bilateral 1/10  . Total knee arthroplasty Bilateral 6/08    No family history on file.  History  Substance Use Topics  . Smoking status: Current Every Day Smoker -- 1.00 packs/day    Types: Cigarettes  . Smokeless tobacco: Never Used  . Alcohol Use: No    OB History   Grav Para Term Preterm Abortions TAB SAB Ect Mult Living   1 1 1       1       Review of Systems 10 Systems reviewed and are negative for acute change except as noted in the HPI. Allergies  Review of patient's allergies indicates no known allergies.  Home Medications   Current Outpatient Rx  Name  Route  Sig  Dispense  Refill  . ACTONEL 150 MG tablet      TAKE 1 TABLET BY MOUTH EVERY 30 DAYS    WITH WATER ON EMPTY  STOMACH, NOTHING BY MOUTH OR LIE DOWN FOR NEXT 30 MINUTES   1 tablet   11   . albuterol (PROVENTIL HFA;VENTOLIN HFA) 108 (90 BASE) MCG/ACT inhaler   Inhalation   Inhale 2 puffs into the lungs every 4 (four) hours as needed for wheezing or shortness of breath.   1 Inhaler   5   . allopurinol (ZYLOPRIM) 100 MG tablet      TAKE ONE TABLET BY MOUTH ONCE DAILY   30 tablet   11   . AMITIZA 24 MCG capsule      TAKE ONE CAPSULE TWICE A DAY WITH A MEAL   60 capsule   11   . amLODipine (NORVASC) 10 MG tablet      TAKE ONE (1) TABLET EACH DAY   30 tablet   11   . Calcium Carbonate-Vitamin D (CALCIUM + D PO)   Oral   Take 1,200 mg by mouth daily.         . cetirizine (ZYRTEC) 10 MG tablet      TAKE ONE (1) TABLET EACH DAY   30 tablet   11     PLEASE RESPOND   . ciprofloxacin-dexamethasone (CIPRODEX) otic suspension  Left Ear   Place 4 drops into the left ear 2 (two) times daily. X 7 days   7.5 mL   0   . colchicine 0.6 MG tablet   Oral   Take 0.6 mg by mouth 2 (two) times daily.         . cyclobenzaprine (FLEXERIL) 10 MG tablet      TAKE ONE (1) TABLET THREE (3) TIMES     A DAY IF NEEDED   270 tablet   11   . docusate sodium (COLACE) 100 MG capsule   Oral   Take 100 mg by mouth 2 (two) times daily.           . fexofenadine (ALLEGRA) 180 MG tablet   Oral   Take 180 mg by mouth daily.         Marland Kitchen HYDROcodone-acetaminophen (NORCO/VICODIN) 5-325 MG per tablet   Oral   Take 1 tablet by mouth every 6 (six) hours as needed for pain.   120 tablet   5   . Multiple Vitamin (MULTIVITAMIN) tablet   Oral   Take 1 tablet by mouth daily.           . Multiple Vitamins-Minerals (MULTIVITAMIN PO)   Oral   Take by mouth daily.         Marland Kitchen NEXIUM 40 MG capsule      TAKE ONE (1) CAPSULE EACH DAY   30 capsule   11     PLEASE RESPOND   . predniSONE (DELTASONE) 20 MG tablet               . traMADol (ULTRAM) 50 MG tablet      TAKE 1 TO 2 TABLETS EVERY  6 HOURS AS    NEEDED FOR PAIN.                                                  GENERIC FOR ULTRAM   120 tablet   5   . Vitamin D, Ergocalciferol, (DRISDOL) 50000 UNITS CAPS   Oral   Take 50,000 Units by mouth every 14 (fourteen) days.           BP 132/88  Pulse 99  Temp(Src) 99.1 F (37.3 C) (Oral)  SpO2 97%  LMP 06/25/2006  Physical Exam  Nursing note and vitals reviewed. Constitutional:  Awake, alert, nontoxic appearance.  HENT:  Head: Atraumatic.  Accident membranes clear with light reflex intact eardrum does appear to be bulging somewhat without erythema or purulence or drainage the left ear canal is erythematous swollen has purulent drainage with decreased visualization of the eardrum with a portion of the eardrum visualized showing erythema dullness and purulence but I was not able to visualize her chronic perforation due to limited view; her left ear itself is nontender without cellulitis  Eyes: Right eye exhibits no discharge. Left eye exhibits no discharge.  Neck: Neck supple.  Cardiovascular: Normal rate and regular rhythm.   No murmur heard. Pulmonary/Chest: Effort normal and breath sounds normal. No respiratory distress. She has no wheezes. She has no rales. She exhibits no tenderness.  Abdominal: Soft. Bowel sounds are normal. There is no tenderness. There is no rebound.  Musculoskeletal: She exhibits no tenderness.  Baseline ROM, no obvious new focal weakness.  Neurological:  Mental status and motor strength appears baseline for patient and situation.  Skin: No rash noted.  Psychiatric: She has a normal mood and affect.    ED Course  Procedures (including critical care time)  Labs Reviewed - No data to display No results found.   1. Left otitis externa       MDM  Patient / Family / Caregiver informed of clinical course, understand medical decision-making process, and agree with plan.  I doubt any other EMC precluding discharge at this time  including, but not necessarily limited to the following:meningitis.         Hurman Horn, MD 10/17/12 0000

## 2012-10-21 ENCOUNTER — Encounter: Payer: Self-pay | Admitting: Family Medicine

## 2012-10-21 ENCOUNTER — Ambulatory Visit (INDEPENDENT_AMBULATORY_CARE_PROVIDER_SITE_OTHER): Payer: Medicare Other | Admitting: Family Medicine

## 2012-10-21 VITALS — BP 160/90 | HR 88 | Temp 99.0°F | Resp 18 | Ht 68.5 in | Wt 144.0 lb

## 2012-10-21 DIAGNOSIS — K219 Gastro-esophageal reflux disease without esophagitis: Secondary | ICD-10-CM

## 2012-10-21 DIAGNOSIS — H608X9 Other otitis externa, unspecified ear: Secondary | ICD-10-CM

## 2012-10-21 DIAGNOSIS — H6062 Unspecified chronic otitis externa, left ear: Secondary | ICD-10-CM

## 2012-10-21 MED ORDER — ESOMEPRAZOLE MAGNESIUM 40 MG PO CPDR: 40.0000 mg | DELAYED_RELEASE_CAPSULE | Freq: Two times a day (BID) | ORAL | Status: DC

## 2012-10-21 MED ORDER — METHOCARBAMOL 500 MG PO TABS: 500.0000 mg | ORAL_TABLET | Freq: Four times a day (QID) | ORAL | Status: DC

## 2012-10-21 NOTE — Progress Notes (Signed)
  Subjective:    Patient ID: [name], female    DOB: 1955/09/22, 57 y.o.   MRN: [id number]  HPI Here for several issues. She has had a chronic left otitis externa with drainage for years but it became painful a few weeks ago. She was seen in the ER on 10-12-12 and she was given Ciprodex drops. These have helped the pain but the drainage continues. Also she has been having a lot of breakthrough heartburn despite taking Nexium every day.   Review of Systems  Constitutional: Negative.   HENT: Positive for ear pain and ear discharge.   Eyes: Negative.   Respiratory: Negative.   Gastrointestinal: Negative.        Objective:   Physical Exam  Constitutional: She appears well-developed and well-nourished.  HENT:  Wears BTE hearing aids. The left external cancal is red and swollen   Abdominal: Soft. Bowel sounds are normal. She exhibits no distension and no mass. There is no tenderness. There is no rebound and no guarding.          Assessment & Plan:  Her otitis is likely fungal, so we will refer her to ENT. Try taking Nexium bid for the GERD

## 2012-10-24 ENCOUNTER — Telehealth: Payer: Self-pay | Admitting: Family Medicine

## 2012-10-24 NOTE — Telephone Encounter (Signed)
Call regarding: Medication refill; seen in the office 10/20/12; placed on ear drops and referred to an ENT; says the ENT can not see her until 11/04/12 and she was told to continue the ear drops until that time; says she will run out before then, so needs a refill sent to [facility] on [address]

## 2012-10-27 MED ORDER — CIPROFLOXACIN-DEXAMETHASONE 0.3-0.1 % OT SUSP: 4.0000 [drp] | Freq: Two times a day (BID) | OTIC | Status: DC

## 2012-10-27 NOTE — Telephone Encounter (Signed)
Call in another 10 ml bottle of Ciprodex with one rf

## 2012-10-27 NOTE — Telephone Encounter (Signed)
Rx sent to [facility]. Pt aware rx sent to pharmacy.

## 2012-11-26 ENCOUNTER — Emergency Department (HOSPITAL_COMMUNITY): Payer: Medicare Other

## 2012-11-26 ENCOUNTER — Inpatient Hospital Stay (HOSPITAL_COMMUNITY)
Admission: EM | Admit: 2012-11-26 | Discharge: 2012-12-05 | DRG: 329 | Disposition: A | Discharge status: Skilled Nursing Facility | Payer: Medicare Other | Attending: Surgery | Admitting: Surgery

## 2012-11-26 ENCOUNTER — Encounter (HOSPITAL_COMMUNITY): Payer: Self-pay

## 2012-11-26 DIAGNOSIS — E876 Hypokalemia: Secondary | ICD-10-CM | POA: Diagnosis not present

## 2012-11-26 DIAGNOSIS — F1021 Alcohol dependence, in remission: Secondary | ICD-10-CM | POA: Diagnosis present

## 2012-11-26 DIAGNOSIS — K219 Gastro-esophageal reflux disease without esophagitis: Secondary | ICD-10-CM | POA: Diagnosis present

## 2012-11-26 DIAGNOSIS — T8131XA Disruption of external operation (surgical) wound, not elsewhere classified, initial encounter: Secondary | ICD-10-CM | POA: Diagnosis not present

## 2012-11-26 DIAGNOSIS — Y838 Other surgical procedures as the cause of abnormal reaction of the patient, or of later complication, without mention of misadventure at the time of the procedure: Secondary | ICD-10-CM | POA: Diagnosis not present

## 2012-11-26 DIAGNOSIS — IMO0002 Reserved for concepts with insufficient information to code with codable children: Secondary | ICD-10-CM

## 2012-11-26 DIAGNOSIS — M4150 Other secondary scoliosis, site unspecified: Secondary | ICD-10-CM | POA: Diagnosis present

## 2012-11-26 DIAGNOSIS — K560 Paralytic ileus: Secondary | ICD-10-CM | POA: Diagnosis not present

## 2012-11-26 DIAGNOSIS — E785 Hyperlipidemia, unspecified: Secondary | ICD-10-CM | POA: Diagnosis present

## 2012-11-26 DIAGNOSIS — I10 Essential (primary) hypertension: Secondary | ICD-10-CM | POA: Diagnosis present

## 2012-11-26 DIAGNOSIS — R1032 Left lower quadrant pain: Secondary | ICD-10-CM

## 2012-11-26 DIAGNOSIS — K658 Other peritonitis: Secondary | ICD-10-CM | POA: Diagnosis present

## 2012-11-26 DIAGNOSIS — R579 Shock, unspecified: Secondary | ICD-10-CM | POA: Diagnosis present

## 2012-11-26 DIAGNOSIS — Z96659 Presence of unspecified artificial knee joint: Secondary | ICD-10-CM

## 2012-11-26 DIAGNOSIS — K5732 Diverticulitis of large intestine without perforation or abscess without bleeding: Principal | ICD-10-CM | POA: Diagnosis present

## 2012-11-26 DIAGNOSIS — A419 Sepsis, unspecified organism: Secondary | ICD-10-CM | POA: Diagnosis present

## 2012-11-26 DIAGNOSIS — M069 Rheumatoid arthritis, unspecified: Secondary | ICD-10-CM

## 2012-11-26 DIAGNOSIS — D62 Acute posthemorrhagic anemia: Secondary | ICD-10-CM | POA: Diagnosis present

## 2012-11-26 DIAGNOSIS — Q8740 Marfan's syndrome, unspecified: Secondary | ICD-10-CM

## 2012-11-26 DIAGNOSIS — F17200 Nicotine dependence, unspecified, uncomplicated: Secondary | ICD-10-CM | POA: Diagnosis present

## 2012-11-26 DIAGNOSIS — R079 Chest pain, unspecified: Secondary | ICD-10-CM | POA: Diagnosis present

## 2012-11-26 DIAGNOSIS — M810 Age-related osteoporosis without current pathological fracture: Secondary | ICD-10-CM | POA: Diagnosis present

## 2012-11-26 DIAGNOSIS — H548 Legal blindness, as defined in USA: Secondary | ICD-10-CM | POA: Diagnosis present

## 2012-11-26 LAB — CBC WITH DIFFERENTIAL/PLATELET
Basophils Absolute: 0 10*3/uL (ref 0.0–0.1)
Basophils Relative: 0 % (ref 0–1)
Eosinophils Absolute: 0 10*3/uL (ref 0.0–0.7)
Eosinophils Relative: 0 % (ref 0–5)
HCT: 39.6 % (ref 36.0–46.0)
Hemoglobin: 13 g/dL (ref 12.0–15.0)
Lymphocytes Relative: 12 % (ref 12–46)
Lymphs Abs: 1.6 10*3/uL (ref 0.7–4.0)
MCH: 29.1 pg (ref 26.0–34.0)
MCHC: 32.8 g/dL (ref 30.0–36.0)
MCV: 88.8 fL (ref 78.0–100.0)
Monocytes Absolute: 0.5 10*3/uL (ref 0.1–1.0)
Monocytes Relative: 4 % (ref 3–12)
Neutro Abs: 11 10*3/uL — ABNORMAL HIGH (ref 1.7–7.7)
Neutrophils Relative %: 84 % — ABNORMAL HIGH (ref 43–77)
Platelets: 230 10*3/uL (ref 150–400)
RBC: 4.46 MIL/uL (ref 3.87–5.11)
RDW: 15.4 % (ref 11.5–15.5)
WBC: 13.1 10*3/uL — ABNORMAL HIGH (ref 4.0–10.5)

## 2012-11-26 LAB — URINALYSIS, ROUTINE W REFLEX MICROSCOPIC
Bilirubin Urine: NEGATIVE
Glucose, UA: NEGATIVE mg/dL
Hgb urine dipstick: NEGATIVE
Ketones, ur: NEGATIVE mg/dL
Leukocytes, UA: NEGATIVE
Nitrite: NEGATIVE
Protein, ur: NEGATIVE mg/dL
Specific Gravity, Urine: 1.013 (ref 1.005–1.030)
Urobilinogen, UA: 1 mg/dL (ref 0.0–1.0)
pH: 7.5 (ref 5.0–8.0)

## 2012-11-26 MED ORDER — SODIUM CHLORIDE 0.9 % IV SOLN
1000.0000 mL | Freq: Once | INTRAVENOUS | Status: AC
  Administered 2012-11-26: 1000 mL via INTRAVENOUS

## 2012-11-26 MED ORDER — ONDANSETRON HCL 4 MG/2ML IJ SOLN
4.0000 mg | Freq: Once | INTRAMUSCULAR | Status: AC
  Administered 2012-11-26: 4 mg via INTRAVENOUS
  Filled 2012-11-26: qty 2

## 2012-11-26 MED ORDER — HYDROMORPHONE HCL PF 1 MG/ML IJ SOLN
1.0000 mg | Freq: Once | INTRAMUSCULAR | Status: AC
  Administered 2012-11-26: 1 mg via INTRAVENOUS
  Filled 2012-11-26: qty 1

## 2012-11-26 MED ORDER — IOHEXOL 300 MG/ML  SOLN: 50.0000 mL | Freq: Once | INTRAMUSCULAR | Status: AC | PRN

## 2012-11-26 MED ORDER — SODIUM CHLORIDE 0.9 % IV SOLN
1000.0000 mL | INTRAVENOUS | Status: DC
  Administered 2012-11-27: 1000 mL via INTRAVENOUS

## 2012-11-26 NOTE — ED Notes (Signed)
Pt brought to ED by EMS from home, pt lives with daughter. Pt c/o abd pain and back onset today.

## 2012-11-26 NOTE — ED Notes (Signed)
[id number]<AV> Expected date:<BR> Expected time:<BR> Means of arrival:<BR> Comments:<BR> EMS/58 yo female with abdominal pain

## 2012-11-27 ENCOUNTER — Encounter (HOSPITAL_COMMUNITY): Admission: EM | Disposition: A | Discharge status: Skilled Nursing Facility | Payer: Self-pay | Source: Home / Self Care

## 2012-11-27 ENCOUNTER — Encounter (HOSPITAL_COMMUNITY): Payer: Self-pay

## 2012-11-27 ENCOUNTER — Inpatient Hospital Stay (HOSPITAL_COMMUNITY): Payer: Medicare Other | Admitting: Anesthesiology

## 2012-11-27 ENCOUNTER — Encounter (HOSPITAL_COMMUNITY): Payer: Self-pay | Admitting: Anesthesiology

## 2012-11-27 DIAGNOSIS — I959 Hypotension, unspecified: Secondary | ICD-10-CM

## 2012-11-27 DIAGNOSIS — A419 Sepsis, unspecified organism: Secondary | ICD-10-CM

## 2012-11-27 DIAGNOSIS — R579 Shock, unspecified: Secondary | ICD-10-CM | POA: Diagnosis present

## 2012-11-27 DIAGNOSIS — E876 Hypokalemia: Secondary | ICD-10-CM | POA: Diagnosis present

## 2012-11-27 DIAGNOSIS — D62 Acute posthemorrhagic anemia: Secondary | ICD-10-CM | POA: Diagnosis present

## 2012-11-27 DIAGNOSIS — K5732 Diverticulitis of large intestine without perforation or abscess without bleeding: Secondary | ICD-10-CM

## 2012-11-27 HISTORY — PX: COLOSTOMY: SHX63

## 2012-11-27 HISTORY — PX: COLECTOMY WITH COLOSTOMY CREATION/HARTMANN PROCEDURE: SHX6598

## 2012-11-27 HISTORY — PX: LAPAROTOMY: SHX154

## 2012-11-27 LAB — PROTIME-INR
INR: 1.13 (ref 0.00–1.49)
Prothrombin Time: 14.3 seconds (ref 11.6–15.2)

## 2012-11-27 LAB — COMPREHENSIVE METABOLIC PANEL
ALT: 14 U/L (ref 0–35)
AST: 19 U/L (ref 0–37)
Albumin: 3.4 g/dL — ABNORMAL LOW (ref 3.5–5.2)
Alkaline Phosphatase: 63 U/L (ref 39–117)
BUN: 13 mg/dL (ref 6–23)
CO2: 31 mEq/L (ref 19–32)
Calcium: 9 mg/dL (ref 8.4–10.5)
Chloride: 95 mEq/L — ABNORMAL LOW (ref 96–112)
Creatinine, Ser: 0.8 mg/dL (ref 0.50–1.10)
GFR calc Af Amer: >90 mL/min (ref >90)
GFR calc non Af Amer: 81 mL/min — ABNORMAL LOW (ref >90)
Glucose, Bld: 97 mg/dL (ref 70–99)
Potassium: 3.3 mEq/L — ABNORMAL LOW (ref 3.5–5.1)
Sodium: 135 mEq/L (ref 135–145)
Total Bilirubin: 0.6 mg/dL (ref 0.3–1.2)
Total Protein: 6.9 g/dL (ref 6.0–8.3)

## 2012-11-27 LAB — CBC WITH DIFFERENTIAL/PLATELET
Band Neutrophils: 44 % — ABNORMAL HIGH (ref 0–10)
Basophils Absolute: 0 10*3/uL (ref 0.0–0.1)
Basophils Relative: 0 % (ref 0–1)
Blasts: 0 %
Eosinophils Absolute: 0 10*3/uL (ref 0.0–0.7)
Eosinophils Relative: 0 % (ref 0–5)
HCT: 38 % (ref 36.0–46.0)
Hemoglobin: 12.5 g/dL (ref 12.0–15.0)
Lymphocytes Relative: 29 % (ref 12–46)
Lymphs Abs: 4.8 10*3/uL — ABNORMAL HIGH (ref 0.7–4.0)
MCH: 29.1 pg (ref 26.0–34.0)
MCHC: 32.9 g/dL (ref 30.0–36.0)
MCV: 88.4 fL (ref 78.0–100.0)
Metamyelocytes Relative: 10 %
Monocytes Absolute: 1.5 10*3/uL — ABNORMAL HIGH (ref 0.1–1.0)
Monocytes Relative: 9 % (ref 3–12)
Myelocytes: 0 %
Neutro Abs: 10.4 10*3/uL — ABNORMAL HIGH (ref 1.7–7.7)
Neutrophils Relative %: 8 % — ABNORMAL LOW (ref 43–77)
Platelets: 221 10*3/uL (ref 150–400)
Promyelocytes Absolute: 0 %
RBC: 4.3 MIL/uL (ref 3.87–5.11)
RDW: 15.7 % — ABNORMAL HIGH (ref 11.5–15.5)
WBC: 16.7 10*3/uL — ABNORMAL HIGH (ref 4.0–10.5)
nRBC: 0 /100 WBC

## 2012-11-27 LAB — TYPE AND SCREEN
ABO/RH(D): A POS
Antibody Screen: NEGATIVE

## 2012-11-27 LAB — SURGICAL PCR SCREEN
MRSA, PCR: NEGATIVE
Staphylococcus aureus: NEGATIVE

## 2012-11-27 LAB — CBC
HCT: 39.7 % (ref 36.0–46.0)
Hemoglobin: 12.4 g/dL (ref 12.0–15.0)
MCH: 27.8 pg (ref 26.0–34.0)
MCHC: 31.2 g/dL (ref 30.0–36.0)
MCV: 89 fL (ref 78.0–100.0)
Platelets: 221 10*3/uL (ref 150–400)
RBC: 4.46 MIL/uL (ref 3.87–5.11)
RDW: 15.6 % — ABNORMAL HIGH (ref 11.5–15.5)
WBC: 8.7 10*3/uL (ref 4.0–10.5)

## 2012-11-27 LAB — BASIC METABOLIC PANEL
BUN: 12 mg/dL (ref 6–23)
CO2: 28 mEq/L (ref 19–32)
Calcium: 8.2 mg/dL — ABNORMAL LOW (ref 8.4–10.5)
Chloride: 97 mEq/L (ref 96–112)
Creatinine, Ser: 0.75 mg/dL (ref 0.50–1.10)
GFR calc Af Amer: >90 mL/min (ref >90)
GFR calc non Af Amer: >90 mL/min (ref >90)
Glucose, Bld: 108 mg/dL — ABNORMAL HIGH (ref 70–99)
Potassium: 3 mEq/L — ABNORMAL LOW (ref 3.5–5.1)
Sodium: 134 mEq/L — ABNORMAL LOW (ref 135–145)

## 2012-11-27 LAB — MAGNESIUM: Magnesium: 1.3 mg/dL — ABNORMAL LOW (ref 1.5–2.5)

## 2012-11-27 LAB — LIPASE, BLOOD: Lipase: 31 U/L (ref 11–59)

## 2012-11-27 LAB — ABO/RH: ABO/RH(D): A POS

## 2012-11-27 LAB — APTT: aPTT: 34 seconds (ref 24–37)

## 2012-11-27 SURGERY — LAPAROTOMY, EXPLORATORY
Anesthesia: General | Site: Abdomen | Wound class: Dirty or Infected

## 2012-11-27 MED ORDER — GLYCOPYRROLATE 0.2 MG/ML IJ SOLN
INTRAMUSCULAR | Status: DC | PRN
  Administered 2012-11-27: 0.6 mg via INTRAVENOUS

## 2012-11-27 MED ORDER — POTASSIUM CHLORIDE 10 MEQ/100ML IV SOLN
10.0000 meq | INTRAVENOUS | Status: DC
  Administered 2012-11-27 (×3): 10 meq via INTRAVENOUS
  Filled 2012-11-27 (×4): qty 100

## 2012-11-27 MED ORDER — HYDROCORTISONE NA SUCCINATE PF 1000 MG IJ SOLR
INTRAMUSCULAR | Status: DC | PRN
  Administered 2012-11-27: 100 mg via INTRAVENOUS

## 2012-11-27 MED ORDER — PROPOFOL 10 MG/ML IV BOLUS
INTRAVENOUS | Status: DC | PRN
  Administered 2012-11-27: 80 mg via INTRAVENOUS

## 2012-11-27 MED ORDER — DIPHENHYDRAMINE HCL 12.5 MG/5ML PO ELIX: 12.5000 mg | ORAL_SOLUTION | Freq: Four times a day (QID) | ORAL | Status: DC | PRN

## 2012-11-27 MED ORDER — LACTATED RINGERS IV SOLN: INTRAVENOUS | Status: DC

## 2012-11-27 MED ORDER — NEOSTIGMINE METHYLSULFATE 1 MG/ML IJ SOLN
INTRAMUSCULAR | Status: DC | PRN
  Administered 2012-11-27: 4 mg via INTRAVENOUS

## 2012-11-27 MED ORDER — 0.9 % SODIUM CHLORIDE (POUR BTL) OPTIME
TOPICAL | Status: DC | PRN
  Administered 2012-11-27: 2000 mL

## 2012-11-27 MED ORDER — ACETAMINOPHEN 325 MG PO TABS
650.0000 mg | ORAL_TABLET | Freq: Once | ORAL | Status: AC
  Administered 2012-11-27: 650 mg via ORAL
  Filled 2012-11-27: qty 2

## 2012-11-27 MED ORDER — BIOTENE DRY MOUTH MT LIQD
15.0000 mL | Freq: Two times a day (BID) | OROMUCOSAL | Status: DC
  Administered 2012-11-29 – 2012-12-05 (×12): 15 mL via OROMUCOSAL

## 2012-11-27 MED ORDER — DIPHENHYDRAMINE HCL 50 MG/ML IJ SOLN: 12.5000 mg | Freq: Four times a day (QID) | INTRAMUSCULAR | Status: DC | PRN

## 2012-11-27 MED ORDER — ONDANSETRON HCL 4 MG/2ML IJ SOLN
INTRAMUSCULAR | Status: DC | PRN
  Administered 2012-11-27: 4 mg via INTRAVENOUS

## 2012-11-27 MED ORDER — ROCURONIUM BROMIDE 100 MG/10ML IV SOLN
INTRAVENOUS | Status: DC | PRN
  Administered 2012-11-27: 40 mg via INTRAVENOUS
  Administered 2012-11-27: 20 mg via INTRAVENOUS

## 2012-11-27 MED ORDER — POTASSIUM CHLORIDE IN NACL 40-0.9 MEQ/L-% IV SOLN
INTRAVENOUS | Status: DC
  Administered 2012-11-27: 16:00:00 via INTRAVENOUS
  Filled 2012-11-27 (×2): qty 1000

## 2012-11-27 MED ORDER — HYDROMORPHONE HCL PF 1 MG/ML IJ SOLN
0.5000 mg | INTRAMUSCULAR | Status: DC | PRN
  Administered 2012-11-27: 0.5 mg via INTRAVENOUS
  Filled 2012-11-27: qty 1

## 2012-11-27 MED ORDER — ALLOPURINOL 100 MG PO TABS
100.0000 mg | ORAL_TABLET | Freq: Every day | ORAL | Status: DC
  Administered 2012-11-27: 100 mg via ORAL
  Filled 2012-11-27: qty 1

## 2012-11-27 MED ORDER — PHENYLEPHRINE HCL 10 MG/ML IJ SOLN
INTRAMUSCULAR | Status: DC | PRN
  Administered 2012-11-27: 120 ug via INTRAVENOUS

## 2012-11-27 MED ORDER — LACTATED RINGERS IV SOLN
INTRAVENOUS | Status: DC
  Administered 2012-11-27: 05:00:00 via INTRAVENOUS

## 2012-11-27 MED ORDER — LACTATED RINGERS IV SOLN
INTRAVENOUS | Status: DC | PRN
  Administered 2012-11-27 (×3): via INTRAVENOUS

## 2012-11-27 MED ORDER — ENOXAPARIN SODIUM 40 MG/0.4ML ~~LOC~~ SOLN
40.0000 mg | SUBCUTANEOUS | Status: DC
  Administered 2012-11-28 – 2012-12-05 (×8): 40 mg via SUBCUTANEOUS
  Filled 2012-11-27 (×9): qty 0.4

## 2012-11-27 MED ORDER — FENTANYL CITRATE 0.05 MG/ML IJ SOLN
25.0000 ug | INTRAMUSCULAR | Status: DC | PRN
  Administered 2012-11-28 (×2): 25 ug via INTRAVENOUS
  Filled 2012-11-27 (×2): qty 2

## 2012-11-27 MED ORDER — IOHEXOL 300 MG/ML  SOLN
100.0000 mL | Freq: Once | INTRAMUSCULAR | Status: AC | PRN
  Administered 2012-11-27: 100 mL via INTRAVENOUS

## 2012-11-27 MED ORDER — PANTOPRAZOLE SODIUM 40 MG PO TBEC
80.0000 mg | DELAYED_RELEASE_TABLET | Freq: Every day | ORAL | Status: DC
  Administered 2012-11-27: 80 mg via ORAL
  Filled 2012-11-27 (×2): qty 1

## 2012-11-27 MED ORDER — PREDNISONE 10 MG PO TABS
10.0000 mg | ORAL_TABLET | Freq: Every day | ORAL | Status: DC
  Administered 2012-11-27: 10 mg via ORAL
  Filled 2012-11-27: qty 1

## 2012-11-27 MED ORDER — SODIUM CHLORIDE 0.9 % IV SOLN
1.0000 g | Freq: Once | INTRAVENOUS | Status: AC
  Administered 2012-11-27: 1 g via INTRAVENOUS
  Filled 2012-11-27: qty 1

## 2012-11-27 MED ORDER — PIPERACILLIN-TAZOBACTAM 3.375 G IVPB
3.3750 g | Freq: Four times a day (QID) | INTRAVENOUS | Status: DC
  Administered 2012-11-27 – 2012-11-28 (×5): 3.375 g via INTRAVENOUS
  Filled 2012-11-27 (×7): qty 50

## 2012-11-27 MED ORDER — HYDROCORTISONE SOD SUCCINATE 100 MG IJ SOLR
INTRAMUSCULAR | Status: AC
  Filled 2012-11-27: qty 2

## 2012-11-27 MED ORDER — POTASSIUM CHLORIDE IN NACL 20-0.9 MEQ/L-% IV SOLN
INTRAVENOUS | Status: DC
  Administered 2012-11-27 – 2012-11-28 (×3): via INTRAVENOUS
  Filled 2012-11-27 (×5): qty 1000

## 2012-11-27 MED ORDER — AMLODIPINE BESYLATE 10 MG PO TABS
10.0000 mg | ORAL_TABLET | Freq: Every day | ORAL | Status: DC
  Administered 2012-11-27: 10 mg via ORAL
  Filled 2012-11-27: qty 1

## 2012-11-27 MED ORDER — HYDROMORPHONE HCL PF 1 MG/ML IJ SOLN: 0.2500 mg | INTRAMUSCULAR | Status: DC | PRN

## 2012-11-27 MED ORDER — LUBIPROSTONE 24 MCG PO CAPS
24.0000 ug | ORAL_CAPSULE | Freq: Every day | ORAL | Status: DC
  Administered 2012-11-27: 24 ug via ORAL
  Filled 2012-11-27 (×2): qty 1

## 2012-11-27 MED ORDER — FENTANYL CITRATE 0.05 MG/ML IJ SOLN
INTRAMUSCULAR | Status: DC | PRN
  Administered 2012-11-27: 100 ug via INTRAVENOUS
  Administered 2012-11-27 (×2): 50 ug via INTRAVENOUS
  Administered 2012-11-27: 100 ug via INTRAVENOUS
  Administered 2012-11-27: 50 ug via INTRAVENOUS
  Administered 2012-11-27: 100 ug via INTRAVENOUS

## 2012-11-27 MED ORDER — ERTAPENEM SODIUM 1 G IJ SOLR: 1.0000 g | INTRAMUSCULAR | Status: DC

## 2012-11-27 MED ORDER — ONDANSETRON HCL 4 MG/2ML IJ SOLN
4.0000 mg | Freq: Four times a day (QID) | INTRAMUSCULAR | Status: DC | PRN
  Administered 2012-11-27: 4 mg via INTRAVENOUS
  Filled 2012-11-27: qty 2

## 2012-11-27 MED ORDER — PANTOPRAZOLE SODIUM 40 MG IV SOLR
40.0000 mg | INTRAVENOUS | Status: DC
  Administered 2012-11-28 – 2012-12-01 (×4): 40 mg via INTRAVENOUS
  Filled 2012-11-27 (×5): qty 40

## 2012-11-27 MED ORDER — SUCCINYLCHOLINE CHLORIDE 20 MG/ML IJ SOLN
INTRAMUSCULAR | Status: DC | PRN
  Administered 2012-11-27: 100 mg via INTRAVENOUS

## 2012-11-27 MED ORDER — CHLORHEXIDINE GLUCONATE 0.12 % MT SOLN
15.0000 mL | Freq: Two times a day (BID) | OROMUCOSAL | Status: DC
  Administered 2012-11-27 – 2012-12-01 (×3): 15 mL via OROMUCOSAL
  Filled 2012-11-27 (×7): qty 15

## 2012-11-27 MED ORDER — SODIUM CHLORIDE 0.9 % IV BOLUS (SEPSIS)
500.0000 mL | Freq: Once | INTRAVENOUS | Status: AC
  Administered 2012-11-27: 500 mL via INTRAVENOUS

## 2012-11-27 MED ORDER — ALBUTEROL SULFATE HFA 108 (90 BASE) MCG/ACT IN AERS: 2.0000 | INHALATION_SPRAY | RESPIRATORY_TRACT | Status: DC | PRN

## 2012-11-27 MED ORDER — DOCUSATE SODIUM 100 MG PO CAPS
100.0000 mg | ORAL_CAPSULE | Freq: Two times a day (BID) | ORAL | Status: DC
  Administered 2012-11-27: 100 mg via ORAL
  Filled 2012-11-27 (×2): qty 1

## 2012-11-27 MED ORDER — HYDROCORTISONE SOD SUCCINATE 100 MG IJ SOLR
100.0000 mg | Freq: Three times a day (TID) | INTRAMUSCULAR | Status: DC
  Administered 2012-11-27 – 2012-11-28 (×2): 100 mg via INTRAVENOUS
  Filled 2012-11-27 (×5): qty 2

## 2012-11-27 SURGICAL SUPPLY — 51 items
APPLICATOR COTTON TIP 6IN STRL (MISCELLANEOUS) IMPLANT
BLADE EXTENDED COATED 6.5IN (ELECTRODE) ×2 IMPLANT
BLADE HEX COATED 2.75 (ELECTRODE) ×2 IMPLANT
CANISTER SUCTION 2500CC (MISCELLANEOUS) ×2 IMPLANT
CHLORAPREP W/TINT 26ML (MISCELLANEOUS) ×2 IMPLANT
CLAMP POUCH DRAINAGE QUIET (OSTOMY) ×2 IMPLANT
CLOTH BEACON ORANGE TIMEOUT ST (SAFETY) ×2 IMPLANT
COVER MAYO STAND STRL (DRAPES) ×2 IMPLANT
DRAPE LAPAROSCOPIC ABDOMINAL (DRAPES) ×2 IMPLANT
DRAPE UTILITY XL STRL (DRAPES) ×2 IMPLANT
DRAPE WARM FLUID 44X44 (DRAPE) ×2 IMPLANT
DRSG PAD ABDOMINAL 8X10 ST (GAUZE/BANDAGES/DRESSINGS) ×2 IMPLANT
ELECT REM PT RETURN 9FT ADLT (ELECTROSURGICAL) ×2
ELECTRODE REM PT RTRN 9FT ADLT (ELECTROSURGICAL) ×1 IMPLANT
GLOVE BIO SURGEON STRL SZ7 (GLOVE) IMPLANT
GLOVE BIOGEL PI IND STRL 7.0 (GLOVE) IMPLANT
GLOVE BIOGEL PI INDICATOR 7.0 (GLOVE)
GLOVE SURG SS PI 7.5 STRL IVOR (GLOVE) ×4 IMPLANT
GOWN PREVENTION PLUS LG XLONG (DISPOSABLE) IMPLANT
GOWN STRL NON-REIN LRG LVL3 (GOWN DISPOSABLE) ×2 IMPLANT
GOWN STRL REIN XL XLG (GOWN DISPOSABLE) ×8 IMPLANT
KIT BASIN OR (CUSTOM PROCEDURE TRAY) ×2 IMPLANT
LIGASURE IMPACT 36 18CM CVD LR (INSTRUMENTS) ×2 IMPLANT
NS IRRIG 1000ML POUR BTL (IV SOLUTION) ×4 IMPLANT
PACK GENERAL/GYN (CUSTOM PROCEDURE TRAY) ×2 IMPLANT
PENCIL BUTTON HOLSTER BLD 10FT (ELECTRODE) ×2 IMPLANT
POUCH OSTOMY 2  H (OSTOMY) ×2 IMPLANT
SCALPEL HARMONIC ACE (MISCELLANEOUS) IMPLANT
SHEARS FOC LG CVD HARMONIC 17C (MISCELLANEOUS) IMPLANT
SPONGE GAUZE 4X4 12PLY (GAUZE/BANDAGES/DRESSINGS) ×2 IMPLANT
SPONGE LAP 18X18 X RAY DECT (DISPOSABLE) ×2 IMPLANT
STAPLER CUT CVD 40MM GREEN (STAPLE) ×2 IMPLANT
STAPLER CUT RELOAD GREEN (STAPLE) ×2 IMPLANT
STAPLER VISISTAT 35W (STAPLE) ×2 IMPLANT
SUCTION POOLE TIP (SUCTIONS) ×2 IMPLANT
SUT PDS AB 0 CT1 36 (SUTURE) ×6 IMPLANT
SUT PDS AB 1 CTX 36 (SUTURE) IMPLANT
SUT PDS AB 1 TP1 96 (SUTURE) IMPLANT
SUT PROLENE 2 0 SH DA (SUTURE) ×4 IMPLANT
SUT SILK 2 0 (SUTURE) ×1
SUT SILK 2 0 SH CR/8 (SUTURE) ×2 IMPLANT
SUT SILK 2-0 18XBRD TIE 12 (SUTURE) ×1 IMPLANT
SUT SILK 3 0 (SUTURE)
SUT SILK 3 0 SH CR/8 (SUTURE) ×2 IMPLANT
SUT SILK 3-0 18XBRD TIE 12 (SUTURE) IMPLANT
SUT VIC AB 2-0 SH 18 (SUTURE) ×2 IMPLANT
SUT VICRYL 3-0 RB1 18 ABS (SUTURE) ×4 IMPLANT
TAPE CLOTH SURG 4X10 WHT LF (GAUZE/BANDAGES/DRESSINGS) ×2 IMPLANT
TOWEL OR 17X26 10 PK STRL BLUE (TOWEL DISPOSABLE) ×2 IMPLANT
TRAY FOLEY CATH 14FRSI W/METER (CATHETERS) ×2 IMPLANT
YANKAUER SUCT BULB TIP NO VENT (SUCTIONS) ×2 IMPLANT

## 2012-11-27 NOTE — ED Provider Notes (Signed)
History     CSN: [id number]  Arrival date & time 11/26/12  2246   First MD Initiated Contact with Patient 11/26/12 2309      Chief Complaint  Patient presents with  . Abdominal Pain  . Back Pain    (Consider location/radiation/quality/duration/timing/severity/associated sxs/prior treatment) Patient is a 57 y.o. female presenting with abdominal pain and back pain. The history is provided by the patient, medical records and a relative. No language interpreter was used.  Abdominal Pain This is a new problem. The current episode started today. The problem occurs constantly. The problem has been gradually worsening. Associated symptoms include abdominal pain, a change in bowel habit (no BM today), chills, fatigue, a fever, nausea and vomiting. Pertinent negatives include no anorexia, arthralgias, chest pain, congestion, coughing, diaphoresis, headaches, joint swelling, myalgias, neck pain, numbness, rash, sore throat, swollen glands, urinary symptoms, vertigo, visual change or weakness. The symptoms are aggravated by eating. She has tried nothing for the symptoms.  Back Pain Associated symptoms: abdominal pain and fever   Associated symptoms: no chest pain, no dysuria, no headaches, no numbness and no weakness     [name] is a 57 y.o. female  with a hx of marfan's syndrome, osteoporosis, HTN, CVA presents to the Emergency Department complaining of gradual, persistent, progressively worsening severe generalized abdominal pain with associated vomiting beginning this morning.  She also endorses low grade fever throughout the day with associated chills.  Pt states the pain has gotten progressively worse throughout the day. Nothing makes it better and eating makes it worse.  She states that at times today the pain became so intense that it radiated into her chest but was never associated with SOB or diaphoresis.  She denies headache, neck pain, diarrhea, weakness, dizziness, syncope, dysuria,  hematuria.    Past Medical History  Diagnosis Date  . Legally blind     since pt was a teenager  . Hearing aid worn     pt wears bilateral hearing aids  . Marfan's syndrome affecting skin     with scolosis  . Osteoporosis   . Hypertension   . Substance abuse     recovering alcoholic x12 years   . Hernia 4/08  . 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 Chronic gout 2008  . Stroke 2/08  . Fibroid   . Status post bunionectomy 1/10    bilateral     Past Surgical History  Procedure Laterality Date  . Bunionectomy Bilateral 1/10  . Total knee arthroplasty Bilateral 6/08  . Hernia repair      No family history on file.  History  Substance Use Topics  . Smoking status: Current Every Day Smoker -- 1.00 packs/day    Types: Cigarettes  . Smokeless tobacco: Never Used  . Alcohol Use: No    OB History   Grav Para Term Preterm Abortions TAB SAB Ect Mult Living   1 1 1       1       Review of Systems  Constitutional: Positive for fever, chills and fatigue. Negative for diaphoresis, appetite change and unexpected weight change.  HENT: Negative for congestion, sore throat, mouth sores, trouble swallowing, neck pain and neck stiffness.   Respiratory: Negative for cough, chest tightness, shortness of breath, wheezing and stridor.   Cardiovascular: Negative for chest pain and palpitations.  Gastrointestinal: Positive for nausea, vomiting, abdominal pain, constipation and change in bowel habit (no BM today). Negative for diarrhea, blood  in stool, abdominal distention, rectal pain and anorexia.  Genitourinary: Negative for dysuria, urgency, frequency, hematuria, flank pain and difficulty urinating.  Musculoskeletal: Positive for back pain. Negative for myalgias, joint swelling and arthralgias.  Skin: Negative for rash.  Neurological: Negative for vertigo, weakness, numbness and headaches.  Hematological: Negative for adenopathy.  Psychiatric/Behavioral: Negative for  confusion.  All other systems reviewed and are negative.    Allergies  Review of patient's allergies indicates no known allergies.  Home Medications   Current Outpatient Rx  Name  Route  Sig  Dispense  Refill  . ACTONEL 150 MG tablet      TAKE 1 TABLET BY MOUTH EVERY 30 DAYS    WITH WATER ON EMPTY STOMACH, NOTHING BY MOUTH OR LIE DOWN FOR NEXT 30 MINUTES   1 tablet   11   . allopurinol (ZYLOPRIM) 100 MG tablet      TAKE ONE TABLET BY MOUTH ONCE DAILY   30 tablet   11   . AMITIZA 24 MCG capsule      TAKE ONE CAPSULE TWICE A DAY WITH A MEAL   60 capsule   11   . amLODipine (NORVASC) 10 MG tablet      TAKE ONE (1) TABLET EACH DAY   30 tablet   11   . Calcium Carbonate-Vitamin D (CALCIUM + D PO)   Oral   Take 1,200 mg by mouth daily.         . cetirizine (ZYRTEC) 10 MG tablet      TAKE ONE (1) TABLET EACH DAY   30 tablet   11     PLEASE RESPOND   . Cyanocobalamin (VITAMIN B 12 PO)   Oral   Take 1 tablet by mouth daily.         . cyclobenzaprine (FLEXERIL) 10 MG tablet      TAKE ONE (1) TABLET THREE (3) TIMES     A DAY IF NEEDED   270 tablet   11   . docusate sodium (COLACE) 100 MG capsule   Oral   Take 100 mg by mouth 2 (two) times daily.           Marland Kitchen esomeprazole (NEXIUM) 40 MG capsule   Oral   Take 1 capsule (40 mg total) by mouth 2 (two) times daily.   60 capsule   11     PLEASE RESPOND   . Multiple Vitamins-Minerals (MULTIVITAMIN PO)   Oral   Take by mouth daily.         . predniSONE (DELTASONE) 10 MG tablet   Oral   Take 10 mg by mouth daily.         . traMADol (ULTRAM) 50 MG tablet      TAKE 1 TO 2 TABLETS EVERY 6 HOURS AS    NEEDED FOR PAIN.                                                  GENERIC FOR ULTRAM   120 tablet   5   . albuterol (PROVENTIL HFA;VENTOLIN HFA) 108 (90 BASE) MCG/ACT inhaler   Inhalation   Inhale 2 puffs into the lungs every 4 (four) hours as needed for wheezing or shortness of breath.   1  Inhaler   5   . HYDROcodone-acetaminophen (NORCO/VICODIN) 5-325 MG per tablet   Oral  Take 1 tablet by mouth every 6 (six) hours as needed for pain.   120 tablet   5     BP 135/83  Pulse 102  Temp(Src) 99.7 F (37.6 C) (Oral)  Resp 18  SpO2 97%  LMP 06/25/2006  Physical Exam  Nursing note and vitals reviewed. Constitutional: She is oriented to person, place, and time. She appears well-developed and well-nourished.  HENT:  Head: Normocephalic and atraumatic.  Mouth/Throat: Oropharynx is clear and moist.  Eyes: Conjunctivae are normal. Pupils are equal, round, and reactive to light. No scleral icterus.  Neck: Normal range of motion.  Cardiovascular: Regular rhythm, S1 normal, S2 normal, normal heart sounds and intact distal pulses.  Tachycardia present.   Pulses:      Radial pulses are 2+ on the right side, and 2+ on the left side.       Dorsalis pedis pulses are 2+ on the right side, and 2+ on the left side.       Posterior tibial pulses are 2+ on the right side, and 2+ on the left side.  Pulmonary/Chest: Effort normal and breath sounds normal. No respiratory distress. She has no wheezes. She has no rales.  Abdominal: Soft. Bowel sounds are normal. She exhibits no distension and no mass. There is generalized tenderness. There is guarding. There is no rigidity, no rebound, no CVA tenderness, no tenderness at McBurney's point and negative Murphy's sign.  Musculoskeletal: Normal range of motion. She exhibits no tenderness.  Lymphadenopathy:    She has no cervical adenopathy.  Neurological: She is alert and oriented to person, place, and time. She exhibits normal muscle tone. Coordination normal.  Skin: Skin is warm and dry. No rash noted. No erythema.  Psychiatric: She has a normal mood and affect. Her behavior is normal.    ED Course  Procedures (including critical care time)  Labs Reviewed  CBC WITH DIFFERENTIAL - Abnormal; Notable for the following:    WBC 13.1 (*)     Neutrophils Relative % 84 (*)    Neutro Abs 11.0 (*)    All other components within normal limits  COMPREHENSIVE METABOLIC PANEL - Abnormal; Notable for the following:    Potassium 3.3 (*)    Chloride 95 (*)    Albumin 3.4 (*)    GFR calc non Af Amer 81 (*)    All other components within normal limits  URINALYSIS, ROUTINE W REFLEX MICROSCOPIC  LIPASE, BLOOD   No results found.   1. Abdominal pain, LLQ       MDM  [name] presents with abdominal pain, low grade fever and soft but TTP generalized abd on exam, worse in the LLQ.  Pt appears uncomfortable.  Will obtain labs and imaging, give fluid, antiemetics and analgesia.    Pt with elevated WBC at 13.1 and mild hypokalemia at 3.3.  UA without evidence of UTI.    Care transferred to [facility] who will follow and dispo.  Dr Juleen China made aware.  CT pending.         Dahlia Client Zebulen Simonis, PA-C 11/27/12 [phone]

## 2012-11-27 NOTE — Anesthesia Preprocedure Evaluation (Addendum)
Anesthesia Evaluation  Patient identified by MRN, date of birth, ID band Patient awake    Reviewed: Allergy & Precautions, H&P , NPO status , Patient's Chart, lab work & pertinent test results  Airway Mallampati: II TM Distance: >3 FB Neck ROM: full    Dental no notable dental hx.    Pulmonary neg pulmonary ROS,  breath sounds clear to auscultation  Pulmonary exam normal       Cardiovascular Exercise Tolerance: Good hypertension, Pt. on medications Rhythm:regular Rate:Normal     Neuro/Psych 2/08 CVA.  Legally blind and HOH CVA, Residual Symptoms negative psych ROS   GI/Hepatic negative GI ROS, Neg liver ROS, GERD-  Medicated and Controlled,  Endo/Other  negative endocrine ROS  Renal/GU negative Renal ROS  negative genitourinary   Musculoskeletal  (+) Arthritis -, Rheumatoid disorders,    Abdominal   Peds  Hematology negative hematology ROS (+)   Anesthesia Other Findings Marfan's syndrome  Reproductive/Obstetrics negative OB ROS                          Anesthesia Physical Anesthesia Plan  ASA: III  Anesthesia Plan: General   Post-op Pain Management:    Induction: Intravenous  Airway Management Planned: Oral ETT  Additional Equipment:   Intra-op Plan:   Post-operative Plan: Extubation in OR  Informed Consent: I have reviewed the patients History and Physical, chart, labs and discussed the procedure including the risks, benefits and alternatives for the proposed anesthesia with the patient or authorized representative who has indicated his/her understanding and acceptance.   Dental Advisory Given  Plan Discussed with: CRNA and Surgeon  Anesthesia Plan Comments:         Anesthesia Quick Evaluation

## 2012-11-27 NOTE — Transfer of Care (Signed)
Immediate Anesthesia Transfer of Care Note  Patient: [name]  Procedure(s) Performed: Procedure(s): EXPLORATORY LAPAROTOMY SIGMOID COLECTOMY, COLOSTOMY (N/A)  Patient Location: PACU  Anesthesia Type:General  Level of Consciousness: awake and sedated  Airway & Oxygen Therapy: Patient Spontanous Breathing and Patient connected to face mask oxygen  Post-op Assessment: Report given to PACU RN and Post -op Vital signs reviewed and stable  Post vital signs: Reviewed and stable  Complications: No apparent anesthesia complications

## 2012-11-27 NOTE — ED Provider Notes (Signed)
Medical screening examination/treatment/procedure(s) were conducted as a shared visit with non-physician practitioner(s) and myself.  I personally evaluated the patient during the encounter  Raeford Razor, MD 11/27/12 [phone]

## 2012-11-27 NOTE — ED Notes (Signed)
Patient transported to CT 

## 2012-11-27 NOTE — Anesthesia Postprocedure Evaluation (Signed)
  Anesthesia Post-op Note  Patient: [name]  Procedure(s) Performed: Procedure(s) (LRB): EXPLORATORY LAPAROTOMY SIGMOID COLECTOMY, COLOSTOMY (N/A)  Patient Location: PACU  Anesthesia Type: General  Level of Consciousness: awake and alert   Airway and Oxygen Therapy: Patient Spontanous Breathing  Post-op Pain: mild  Post-op Assessment: Post-op Vital signs reviewed, Patient's Cardiovascular Status Stable, Respiratory Function Stable, Patent Airway and No signs of Nausea or vomiting  Last Vitals:  Filed Vitals:   11/27/12 0930  BP: 98/62  Pulse: 120  Temp: 37.4 C  Resp: 18    Post-op Vital Signs: stable   Complications: No apparent anesthesia complications

## 2012-11-27 NOTE — ED Provider Notes (Signed)
Medical screening examination/treatment/procedure(s) were conducted as a shared visit with non-physician practitioner(s) and myself.  I personally evaluated the patient during the encounter.  56yF with abdominal pain. On exam distended. Diffuse tenderness with voluntary guarding, worst LLQ. No rebound. CT significant for diverticulitis with perforation. No mention of abscess. Not on blood thinners. Distant hx of etoh abuse without known cirrhosis. Past surgical history significant for R femoral hernia repair in 2008 by Dr Ezzard Standing. Abx. Continued PRN pain meds. Surgical consultation.   CRITICAL CARE Performed by: Raeford Razor Total critical care time: 35 minutes Critical care time was exclusive of separately billable procedures and treating other patients. Critical care was necessary to treat or prevent imminent or life-threatening deterioration. Critical care was time spent personally by me on the following activities: development of treatment plan with patient and/or surrogate as well as nursing, discussions with consultants, evaluation of patient's response to treatment, examination of patient, obtaining history from patient or surrogate, ordering and performing treatments and interventions, ordering and review of laboratory studies, ordering and review of radiographic studies, pulse oximetry and re-evaluation of patient's condition.     Raeford Razor, MD 11/27/12 [phone]

## 2012-11-27 NOTE — Progress Notes (Signed)
Subjective: Just vomited , BP 82/49  HR 119  Sat 89 %  0n R/A  Objective: Vital signs in last 24 hours: Temp:  [99.4 F (37.4 C)-99.7 F (37.6 C)] 99.4 F (37.4 C) (06/05 0930) Pulse Rate:  [102-120] 120 (06/05 0930) Resp:  [11-20] 18 (06/05 0930) BP: (98-136)/(62-83) 98/62 mmHg (06/05 0930) SpO2:  [92 %-98 %] 92 % (06/05 0930) Weight:  [64.3 kg (141 lb 12.1 oz)] 64.3 kg (141 lb 12.1 oz) (06/05 0300) Last BM Date: 11/26/12 I/O=? TM 99.7 Tachycardic with drop of BP this AM. K+ 3.0 WBC down to 8.7   Intake/Output from previous day:   Intake/Output this shift: Total I/O In: -  Out: 400 [Urine:400]  General appearance: alert, mild distress and sick after vomiting Eyes: blind both eyes matted, with purulent material Resp: clear GI: distended,tender, few BS, Extremities: significant arthritic changes.  Lab Results:   Recent Labs  11/26/12 2330 11/27/12 0530  WBC 13.1* 8.7  HGB 13.0 12.4  HCT 39.6 39.7  PLT 230 221    BMET  Recent Labs  11/26/12 2330 11/27/12 0530  NA 135 134*  K 3.3* 3.0*  CL 95* 97  CO2 31 28  GLUCOSE 97 108*  BUN 13 12  CREATININE 0.80 0.75  CALCIUM 9.0 8.2*   PT/INR No results found for this basename: LABPROT, INR,  in the last 72 hours   Recent Labs Lab 11/26/12 2330  AST 19  ALT 14  ALKPHOS 63  BILITOT 0.6  PROT 6.9  ALBUMIN 3.4*     Lipase     Component Value Date/Time   LIPASE 31 11/26/2012 2330     Studies/Results: Ct Abdomen Pelvis W Contrast  11/27/2012   *RADIOLOGY REPORT*  Clinical Data: Abdominal and back pain.  CT ABDOMEN AND PELVIS WITH CONTRAST  Technique:  Multidetector CT imaging of the abdomen and pelvis was performed following the standard protocol during bolus administration of intravenous contrast.  Contrast: OMNIPAQUE IOHEXOL 300 MG/ML  SOLN  Comparison: CT of the abdomen and pelvis from 11/07/2005  Findings: Minimal left basilar atelectasis is noted.  A calcified granuloma is noted at the  hepatic dome.  Scattered hepatic cysts are again seen, the largest of which measures 2.0 cm in size.  The liver is otherwise unremarkable in appearance.  The spleen is mildly diminutive and grossly unremarkable.  The gallbladder is within normal limits.  There is dilatation of the pancreatic duct to 5-6 mm in diameter, mildly more prominent than on the prior study.  There is also distension of the common hepatic duct to 1.1 cm in diameter.  This is of uncertain etiology; distal obstruction cannot be entirely excluded.  The pancreas and adrenal glands are otherwise unremarkable in appearance.  A mildly heterogeneous relatively high attenuation 1.0 cm hypodensity is noted arising at the posteromedial aspect of the left kidney.  Though this could reflect a cyst, a small mass cannot be excluded.  The kidneys are otherwise unremarkable in appearance. There is no evidence of hydronephrosis.  No renal or ureteral stones are seen.  No perinephric stranding is appreciated.  No free fluid is identified.  The small bowel is unremarkable in appearance.  The stomach is within normal limits.  No acute vascular abnormalities are seen.  There is relatively diffuse calcification along the abdominal aorta and its branches.  The abdominal aorta is tortuous in appearance.  There is significant wall thickening at the mid to distal sigmoid colon, with associated diffuse soft  tissue inflammation.  A large 4.5 x 3.4 cm collection of air is noted adjacent to the mid sigmoid colon, with a small amount of air noted tracking superiorly along the corresponding mesenteric vein.  This is compatible with a focal perforation.  A single tiny focus of free intraperitoneal air is noted along the anterior right hemipelvis.  Associated trace free fluid is seen.  Diffuse underlying diverticulosis is noted along the sigmoid colon, with more mild diverticulosis noted along the descending colon.  The transverse colon is mildly redundant.  The appendix is  grossly normal in caliber, though not fully assessed; there is no evidence for appendicitis.  The bladder is moderately distended and grossly unremarkable in appearance.  The uterus is within normal limits.  The ovaries are not well assessed due to the inflammatory process within the pelvis.  No suspicious adnexal masses are seen.  No inguinal lymphadenopathy is seen.  No acute osseous abnormalities are identified.  Mild degenerative change is noted at the left hip joint.  A healed right inferior pubic ramus fracture is noted.  Significant left convex lumbar scoliosis is noted, with associated chronic degenerative change.  IMPRESSION:  1.  Significant acute diverticulitis at the mid to distal sigmoid colon, with associated focal perforation.  Large 4.5 x 3.4 cm collection of air noted at the mesentery adjacent to the sigmoid colon, with a small amount of air tracking superiorly along the corresponding mesenteric vein.  Tiny focus of free intraperitoneal air noted along the anterior right hemipelvis. 2.  Associated trace free fluid seen; diffuse underlying diverticulosis noted, with more mild diverticulosis along the descending colon. 3.  Dilatation of the pancreatic duct to 5-6 mm in diameter, with dilatation of the common hepatic duct to 1.1 cm in diameter. Distal obstruction cannot be excluded; MRCP would be helpful for further evaluation, when the patient is able to hold her breath for the study. 4.  Mildly heterogeneous relatively high attenuation 1.0 cm hypodensity at the posterior medial aspect of the left kidney, new from 2007.  Though this could reflect a cyst, a small mass cannot be excluded.  This could also be assessed when performing an MRCP as described above. 5.  Scattered hepatic cysts again noted. 6.  Relatively diffuse calcification along the abdominal aorta and its branches. 7.  Significant left convex lumbar scoliosis noted.  Critical Value/emergent results were called by telephone at the time of  interpretation on 11/27/2012 at 12:42 a.m. to [facility], who verbally acknowledged these results.   Original Report Authenticated By: Tonia Ghent, M.D.    Medications: . allopurinol  100 mg Oral Daily  . amLODipine  10 mg Oral Daily  . docusate sodium  100 mg Oral BID  . enoxaparin (LOVENOX) injection  40 mg Subcutaneous Q24H  . [START ON 11/28/2012] ertapenem (INVANZ) IV  1 g Intravenous Q24H  . lubiprostone  24 mcg Oral Q breakfast  . pantoprazole  80 mg Oral Q1200  . predniSONE  10 mg Oral Daily   Scheduled Meds: . allopurinol  100 mg Oral Daily  . amLODipine  10 mg Oral Daily  . docusate sodium  100 mg Oral BID  . enoxaparin (LOVENOX) injection  40 mg Subcutaneous Q24H  . [START ON 11/28/2012] ertapenem (INVANZ) IV  1 g Intravenous Q24H  . lubiprostone  24 mcg Oral Q breakfast  . pantoprazole  80 mg Oral Q1200  . predniSONE  10 mg Oral Daily   Continuous Infusions: . sodium chloride 1,000 mL (11/27/12  1610)  . lactated ringers 125 mL/hr at 11/27/12 0518   . sodium chloride 1,000 mL (11/27/12 0047)  . lactated ringers 125 mL/hr at 11/27/12 0518    PRN Meds:.albuterol, diphenhydrAMINE, diphenhydrAMINE, HYDROmorphone (DILAUDID) injection, ondansetron Prior to Admission medications   Medication Sig Start Date End Date Taking? Authorizing Provider  ACTONEL 150 MG tablet TAKE 1 TABLET BY MOUTH EVERY 30 DAYS    WITH WATER ON EMPTY STOMACH, NOTHING BY MOUTH OR LIE DOWN FOR NEXT 30 MINUTES 06/02/12  Yes Nelwyn Salisbury, MD  allopurinol (ZYLOPRIM) 100 MG tablet TAKE ONE TABLET BY MOUTH ONCE DAILY 06/02/12  Yes Nelwyn Salisbury, MD  AMITIZA 24 MCG capsule TAKE ONE CAPSULE TWICE A DAY WITH A MEAL 06/02/12  Yes Nelwyn Salisbury, MD  amLODipine (NORVASC) 10 MG tablet TAKE ONE (1) TABLET EACH DAY 11/26/11  Yes Nelwyn Salisbury, MD  Calcium Carbonate-Vitamin D (CALCIUM + D PO) Take 1,200 mg by mouth daily.   Yes Historical Provider, MD  cetirizine (ZYRTEC) 10 MG tablet TAKE ONE (1) TABLET EACH DAY  04/30/12  Yes Nelwyn Salisbury, MD  Cyanocobalamin (VITAMIN B 12 PO) Take 1 tablet by mouth daily.   Yes Historical Provider, MD  cyclobenzaprine (FLEXERIL) 10 MG tablet TAKE ONE (1) TABLET THREE (3) TIMES     A DAY IF NEEDED 06/02/12  Yes Nelwyn Salisbury, MD  docusate sodium (COLACE) 100 MG capsule Take 100 mg by mouth 2 (two) times daily.     Yes Historical Provider, MD  esomeprazole (NEXIUM) 40 MG capsule Take 1 capsule (40 mg total) by mouth 2 (two) times daily. 10/21/12  Yes Nelwyn Salisbury, MD  Multiple Vitamins-Minerals (MULTIVITAMIN PO) Take by mouth daily.   Yes Historical Provider, MD  predniSONE (DELTASONE) 10 MG tablet Take 10 mg by mouth daily.   Yes Historical Provider, MD  traMADol (ULTRAM) 50 MG tablet TAKE 1 TO 2 TABLETS EVERY 6 HOURS AS    NEEDED FOR PAIN.                                                  GENERIC FOR Janean Sark 11/26/11  Yes Nelwyn Salisbury, MD  albuterol (PROVENTIL HFA;VENTOLIN HFA) 108 (90 BASE) MCG/ACT inhaler Inhale 2 puffs into the lungs every 4 (four) hours as needed for wheezing or shortness of breath. 02/27/12 02/26/13  Nelwyn Salisbury, MD  HYDROcodone-acetaminophen (NORCO/VICODIN) 5-325 MG per tablet Take 1 tablet by mouth every 6 (six) hours as needed for pain. 08/26/12   Nelwyn Salisbury, MD     Assessment/Plan Focally perforated diverticulitis mid to diatal sigmoid, with perforation 4.5 x 3.4 collection.  Dialated distal pancreatic duct 5-6 mm. CBD, dilatation.   Now hypotensive Probable sepsis Hypokalemia Marfan's syndrome.  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 Chronic steroid use for her severe arthritis Tobacco use Hx of probable TIA 07/2006 Gout Hx of ETOH dependence; now resolved.  Chronic constipation followed by Dr. Leone Payor Hypertension GERD Dyslipidemia Scoliosis  BMI 21.6  Plan:  i took her BP and it was down, she is tender and distended.  I think she will need to go to the OR.   Getting fluid bolus and O2. We will discuss with DR. Biagio Quint  and she will need a Medical management consult later after surgery. She is currently covered with [employer]  at 1 AM this morning. I will start replacing K+, check coags. Fluid as needed.     LOS: 1 day    JENNINGS,WILLARD 11/27/2012  Pt seen and examined.  She remains tachycardic.  Her BP was in the 80's but she has responded to IV fluids.  Her abdomen is distended and tympanitic with diffuse tenderness and peritoneal signs.  I have recommended ex. Lap and sigmoidectomy with colostomy formation given her exam, especially given her steroid use.  I really think that this is the most conservative option for her.  I discussed the options of continued observation and abx vs. Surgery and the  risks of the procedure with her and her daughter.  They agree to proceed with planned procedure.  I explained that she will require colostomy and this may or may not be reversible.  They expressed understanding and would like to proceed with ex. Lap sigmoid colectomy and colostomy.

## 2012-11-27 NOTE — ED Notes (Addendum)
Daughter contact info Malicia [phone]

## 2012-11-27 NOTE — Care Management Note (Addendum)
    Page 1 of 2   12/04/2012     11:23:42 AM   CARE MANAGEMENT NOTE 12/04/2012  Patient:  [name], [name]   Account Number:  [id number]  Date Initiated:  11/27/2012  Documentation initiated by:  [name] Clam  Subjective/Objective Assessment:   ADMITTED W/LLQ ABD PAIN.     Action/Plan:   FROM HOME.HAS PCP,PHARMACY.   Anticipated DC Date:  12/08/2012   Anticipated DC Plan:  SKILLED NURSING FACILITY  In-house referral  Clinical Social Worker      DC Planning Services  CM consult      Choice offered to / List presented to:             Status of service:  Completed, signed off Medicare Important Message given?   (If response is "NO", the following Medicare IM given date fields will be blank) Date Medicare IM given:   Date Additional Medicare IM given:    Discharge Disposition:  SKILLED NURSING FACILITY  Per UR Regulation:  Reviewed for med. necessity/level of care/duration of stay  If discussed at Long Length of Stay Meetings, dates discussed:   12/04/2012    Comments:  [id number] Earlene Plater, RN, BSN, CCM: CHART REVIEWED AND UPDATED.  Next chart review due on [id number]. NO DISCHARGE NEEDS PRESENT AT THIS TIME. CASE MANAGEMENT [id number]   11/30/12 KATHY MAHABIR RN,BSN NCM WEEKEND [phone] PT/OT-CIR.AWAIT INPT REHAB COORDINATOR SCREENING/RECOMMENDATIONS.  11/27/12 KATHY MAHABIR RN,BSN NCM [phone] SURGERY TODAY FOR PERFORATED DIVERTICULM-EXP LAP SIGMOID COLECTOMY/COLOSTOMY.

## 2012-11-27 NOTE — OR Nursing (Signed)
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  She was unable to verbalize to me what her surgical procedure was, instead telling me she was to have a colonoscopy.  Garnetta Buddy, RN in the holding area called patient's daughter and explained the situation.  The daughter granted her permission for her mother's surgical procedure and this was documented on the consent form.

## 2012-11-27 NOTE — Brief Op Note (Signed)
11/26/2012 - 11/27/2012  3:41 PM  PATIENT:  [name]  57 y.o. female  PRE-OPERATIVE DIAGNOSIS:  diverticulitis  POST-OPERATIVE DIAGNOSIS:  same  PROCEDURE:  Procedure(s): EXPLORATORY LAPAROTOMY SIGMOID COLECTOMY, COLOSTOMY (N/A)  SURGEON:  Surgeon(s) and Role:    * Lodema Pilot, DO - Primary    * Thomas A. Cornett, MD - Assisting  PHYSICIAN ASSISTANT:   ASSISTANTS: Cornett   ANESTHESIA:   general  EBL:  Total I/O In: 2200 [I.V.:2200] Out: 1385 [Urine:535; Other:700; Blood:150]  BLOOD ADMINISTERED:none  DRAINS: none   LOCAL MEDICATIONS USED:  NONE  SPECIMEN:  Source of Specimen:  sigmoid colon (suture proximal), peritoneal nodule  DISPOSITION OF SPECIMEN:  PATHOLOGY  COUNTS:  YES  TOURNIQUET:  * No tourniquets in log *  DICTATION: .Other Dictation: Dictation Number [id number]  PLAN OF CARE: Admit to inpatient   PATIENT DISPOSITION:  ICU - extubated and stable.   Delay start of Pharmacological VTE agent (>24hrs) due to surgical blood loss or risk of bleeding: no

## 2012-11-27 NOTE — H&P (Signed)
[name] is an 57 y.o. female.   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  The pt states intermittent lower abd pain for about 2 years.  She notice a severe increase in this pain today.  She also noted some back pain and low grade fevers.  She is having nausea but not vomiting.  She is passing flatus.  She is due for a colonoscopy with Dr Leone Payor.  She has a known h/o diverticulosis severe on the left on previous colonoscopy 45yrs ago.  She does take 10mg  of prednisone a day.  She is a smoker.  She is not currently on any bloody thinners.  Past Medical History  Diagnosis Date  . Legally blind     since pt was a teenager  . Hearing aid worn     pt wears bilateral hearing aids  . Marfan's syndrome affecting skin     with scolosis  . Osteoporosis   . Hypertension   . Substance abuse     recovering alcoholic x12 years   . Hernia 4/08  . 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 Chronic gout 2008  . Stroke 2/08  . Fibroid   . Status post bunionectomy 1/10    bilateral     Past Surgical History  Procedure Laterality Date  . Bunionectomy Bilateral 1/10  . Total knee arthroplasty Bilateral 6/08  . Hernia repair      No family history on file. Social History:  reports that she has been smoking Cigarettes.  She has been smoking about 1.00 pack per day. She has never used smokeless tobacco. She reports that she does not drink alcohol or use illicit drugs.  Allergies: No Known Allergies   (Not in a hospital admission)  Results for orders placed during the hospital encounter of 11/26/12 (from the past 48 hour(s))  URINALYSIS, ROUTINE W REFLEX MICROSCOPIC     Status: None   Collection Time    11/26/12 11:07 PM      Result Value Range   Color, Urine YELLOW  YELLOW   APPearance CLEAR  CLEAR   Specific Gravity, Urine 1.013  1.005 - 1.030   pH 7.5  5.0 - 8.0    Glucose, UA NEGATIVE  NEGATIVE mg/dL   Hgb urine dipstick NEGATIVE  NEGATIVE   Bilirubin Urine NEGATIVE  NEGATIVE   Ketones, ur NEGATIVE  NEGATIVE mg/dL   Protein, ur NEGATIVE  NEGATIVE mg/dL   Urobilinogen, UA 1.0  0.0 - 1.0 mg/dL   Nitrite NEGATIVE  NEGATIVE   Leukocytes, UA NEGATIVE  NEGATIVE   Comment: MICROSCOPIC NOT DONE ON URINES WITH NEGATIVE PROTEIN, BLOOD, LEUKOCYTES, NITRITE, OR GLUCOSE <1000 mg/dL.  CBC WITH DIFFERENTIAL     Status: Abnormal   Collection Time    11/26/12 11:30 PM      Result Value Range   WBC 13.1 (*) 4.0 - 10.5 K/uL   RBC 4.46  3.87 - 5.11 MIL/uL   Hemoglobin 13.0  12.0 - 15.0 g/dL   HCT 16.1  09.6 - 04.5 %   MCV 88.8  78.0 - 100.0 fL   MCH 29.1  26.0 - 34.0 pg   MCHC 32.8  30.0 - 36.0 g/dL   RDW 40.9  81.1 - 91.4 %   Platelets 230  150 - 400 K/uL   Neutrophils Relative % 84 (*)  43 - 77 %   Neutro Abs 11.0 (*) 1.7 - 7.7 K/uL   Lymphocytes Relative 12  12 - 46 %   Lymphs Abs 1.6  0.7 - 4.0 K/uL   Monocytes Relative 4  3 - 12 %   Monocytes Absolute 0.5  0.1 - 1.0 K/uL   Eosinophils Relative 0  0 - 5 %   Eosinophils Absolute 0.0  0.0 - 0.7 K/uL   Basophils Relative 0  0 - 1 %   Basophils Absolute 0.0  0.0 - 0.1 K/uL  COMPREHENSIVE METABOLIC PANEL     Status: Abnormal   Collection Time    11/26/12 11:30 PM      Result Value Range   Sodium 135  135 - 145 mEq/L   Potassium 3.3 (*) 3.5 - 5.1 mEq/L   Chloride 95 (*) 96 - 112 mEq/L   CO2 31  19 - 32 mEq/L   Glucose, Bld 97  70 - 99 mg/dL   BUN 13  6 - 23 mg/dL   Creatinine, Ser 1.61  0.50 - 1.10 mg/dL   Calcium 9.0  8.4 - 09.6 mg/dL   Total Protein 6.9  6.0 - 8.3 g/dL   Albumin 3.4 (*) 3.5 - 5.2 g/dL   AST 19  0 - 37 U/L   ALT 14  0 - 35 U/L   Alkaline Phosphatase 63  39 - 117 U/L   Total Bilirubin 0.6  0.3 - 1.2 mg/dL   GFR calc non Af Amer 81 (*) >90 mL/min   GFR calc Af Amer >90  >90 mL/min   Comment:            The eGFR has been calculated     using the CKD EPI equation.     This  calculation has not been     validated in all clinical     situations.     eGFR's persistently     <90 mL/min signify     possible Chronic Kidney Disease.  LIPASE, BLOOD     Status: None   Collection Time    11/26/12 11:30 PM      Result Value Range   Lipase 31  11 - 59 U/L   Ct Abdomen Pelvis W Contrast  11/27/2012   *RADIOLOGY REPORT*  Clinical Data: Abdominal and back pain.  CT ABDOMEN AND PELVIS WITH CONTRAST  Technique:  Multidetector CT imaging of the abdomen and pelvis was performed following the standard protocol during bolus administration of intravenous contrast.  Contrast: OMNIPAQUE IOHEXOL 300 MG/ML  SOLN  Comparison: CT of the abdomen and pelvis from 11/07/2005  Findings: Minimal left basilar atelectasis is noted.  A calcified granuloma is noted at the hepatic dome.  Scattered hepatic cysts are again seen, the largest of which measures 2.0 cm in size.  The liver is otherwise unremarkable in appearance.  The spleen is mildly diminutive and grossly unremarkable.  The gallbladder is within normal limits.  There is dilatation of the pancreatic duct to 5-6 mm in diameter, mildly more prominent than on the prior study.  There is also distension of the common hepatic duct to 1.1 cm in diameter.  This is of uncertain etiology; distal obstruction cannot be entirely excluded.  The pancreas and adrenal glands are otherwise unremarkable in appearance.  A mildly heterogeneous relatively high attenuation 1.0 cm hypodensity is noted arising at the posteromedial aspect of the left kidney.  Though this could reflect a cyst, a small mass cannot  be excluded.  The kidneys are otherwise unremarkable in appearance. There is no evidence of hydronephrosis.  No renal or ureteral stones are seen.  No perinephric stranding is appreciated.  No free fluid is identified.  The small bowel is unremarkable in appearance.  The stomach is within normal limits.  No acute vascular abnormalities are seen.  There is  relatively diffuse calcification along the abdominal aorta and its branches.  The abdominal aorta is tortuous in appearance.  There is significant wall thickening at the mid to distal sigmoid colon, with associated diffuse soft tissue inflammation.  A large 4.5 x 3.4 cm collection of air is noted adjacent to the mid sigmoid colon, with a small amount of air noted tracking superiorly along the corresponding mesenteric vein.  This is compatible with a focal perforation.  A single tiny focus of free intraperitoneal air is noted along the anterior right hemipelvis.  Associated trace free fluid is seen.  Diffuse underlying diverticulosis is noted along the sigmoid colon, with more mild diverticulosis noted along the descending colon.  The transverse colon is mildly redundant.  The appendix is grossly normal in caliber, though not fully assessed; there is no evidence for appendicitis.  The bladder is moderately distended and grossly unremarkable in appearance.  The uterus is within normal limits.  The ovaries are not well assessed due to the inflammatory process within the pelvis.  No suspicious adnexal masses are seen.  No inguinal lymphadenopathy is seen.  No acute osseous abnormalities are identified.  Mild degenerative change is noted at the left hip joint.  A healed right inferior pubic ramus fracture is noted.  Significant left convex lumbar scoliosis is noted, with associated chronic degenerative change.  IMPRESSION:  1.  Significant acute diverticulitis at the mid to distal sigmoid colon, with associated focal perforation.  Large 4.5 x 3.4 cm collection of air noted at the mesentery adjacent to the sigmoid colon, with a small amount of air tracking superiorly along the corresponding mesenteric vein.  Tiny focus of free intraperitoneal air noted along the anterior right hemipelvis. 2.  Associated trace free fluid seen; diffuse underlying diverticulosis noted, with more mild diverticulosis along the descending  colon. 3.  Dilatation of the pancreatic duct to 5-6 mm in diameter, with dilatation of the common hepatic duct to 1.1 cm in diameter. Distal obstruction cannot be excluded; MRCP would be helpful for further evaluation, when the patient is able to hold her breath for the study. 4.  Mildly heterogeneous relatively high attenuation 1.0 cm hypodensity at the posterior medial aspect of the left kidney, new from 2007.  Though this could reflect a cyst, a small mass cannot be excluded.  This could also be assessed when performing an MRCP as described above. 5.  Scattered hepatic cysts again noted. 6.  Relatively diffuse calcification along the abdominal aorta and its branches. 7.  Significant left convex lumbar scoliosis noted.  Critical Value/emergent results were called by telephone at the time of interpretation on 11/27/2012 at 12:42 a.m. to [facility], who verbally acknowledged these results.   Original Report Authenticated By: Tonia Ghent, M.D.    Review of Systems  Constitutional: Positive for fever. Negative for chills and weight loss.  Respiratory: Negative for cough and shortness of breath.   Cardiovascular: Negative for chest pain.  Gastrointestinal: Positive for nausea, abdominal pain and constipation. Negative for vomiting, diarrhea and blood in stool.  Genitourinary: Negative for dysuria, urgency and frequency.  Musculoskeletal: Positive for back pain.  Skin:  Negative for itching and rash.  Neurological: Negative for headaches.    Blood pressure 124/72, pulse 112, temperature 99.7 F (37.6 C), temperature source Oral, resp. rate 18, last menstrual period 06/25/2006, SpO2 97.00%. Physical Exam  Constitutional: She is oriented to person, place, and time. No distress.  HENT:  Head: Normocephalic and atraumatic.  Neck: Normal range of motion.  Cardiovascular: Regular rhythm.  Tachycardia present.   Respiratory: Effort normal and breath sounds normal.  GI: Soft. She exhibits  distension. There is tenderness in the suprapubic area. There is no rebound and no guarding.  Neurological: She is alert and oriented to person, place, and time.  Skin: Skin is warm and dry. She is not diaphoretic.     Assessment/Plan [name] is a 57 y.o. F with focally perforated diverticulitis.  She is not hemodynamically unstable, but she is tachycardic.  I would like to admit her to a tele floor.  I will place her on IV antibiotics and give her some fluid resuscitation.  I discussed management options with her and her daughter.  Right now she has a phlegmon in her pelvis, and I am not sure how well this will develop into an abscess, given her prednisone use.  We will reassess in the am.  Maisie Fus, Tamani Durney C. 11/27/2012, 1:43 AM

## 2012-11-27 NOTE — Consult Note (Addendum)
PULMONARY  / CRITICAL CARE MEDICINE  Name: [name] MRN: [id number] DOB: 15-Feb-1956    ADMISSION DATE:  11/26/2012 CONSULTATION DATE:  11/27/12  REFERRING MD :  Biagio Quint PRIMARY SERVICE: [facility]  CHIEF COMPLAINT:  abd pain, low pb  BRIEF PATIENT DESCRIPTION:  25 yobf smoker with Marfans and hbp on norvasc and pred daily  admit pm 6/4 with abd pain and underwent ex lap pm 6/5 with perf tic and would left open and transferred to ICU with soft bp's so PCCM service consulted.  SIGNIFICANT EVENTS / STUDIES:  6/5 Exp lap with sig colectomy/colostomy  LINES / TUBES:    CULTURES: 6/5  MRSA > neg   ANTIBIOTICS: Zoysn 6/5 >>>    HISTORY OF PRESENT ILLNESS:  61 yobf smoker with Marfan's syndrome. 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 The pt states intermittent lower abd pain for about 2 years. She notice a severe increase in this pain 6/4. She also noted some back pain and low grade fevers  nausea but not vomiting and passing flatus with a known h/o diverticulosis severe on the left on previous colonoscopy 71yrs ago. On 10mg  of prednisone a day.    Taken to OR pm 6/5 with findings of perf tic, with soft bp's post op so PCCM consulted  PAST MEDICAL HISTORY :  Past Medical History  Diagnosis Date  . Legally blind     since pt was a teenager  . Hearing aid worn     pt wears bilateral hearing aids  . Marfan's syndrome affecting skin     with scolosis  . Osteoporosis   . Hypertension   . Substance abuse     recovering alcoholic x12 years   . Hernia 4/08  . 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 Chronic gout 2008  . Stroke 2/08  . Fibroid   . Status post bunionectomy 1/10    bilateral    Past Surgical History  Procedure Laterality Date  . Bunionectomy Bilateral 1/10  . Total knee arthroplasty Bilateral 6/08  . Hernia repair     Prior to Admission medications   Medication Sig Start Date End Date Taking? Authorizing Provider   ACTONEL 150 MG tablet TAKE 1 TABLET BY MOUTH EVERY 30 DAYS    WITH WATER ON EMPTY STOMACH, NOTHING BY MOUTH OR LIE DOWN FOR NEXT 30 MINUTES 06/02/12  Yes Nelwyn Salisbury, MD  allopurinol (ZYLOPRIM) 100 MG tablet TAKE ONE TABLET BY MOUTH ONCE DAILY 06/02/12  Yes Nelwyn Salisbury, MD  AMITIZA 24 MCG capsule TAKE ONE CAPSULE TWICE A DAY WITH A MEAL 06/02/12  Yes Nelwyn Salisbury, MD  amLODipine (NORVASC) 10 MG tablet TAKE ONE (1) TABLET EACH DAY 11/26/11  Yes Nelwyn Salisbury, MD  Calcium Carbonate-Vitamin D (CALCIUM + D PO) Take 1,200 mg by mouth daily.   Yes Historical Provider, MD  cetirizine (ZYRTEC) 10 MG tablet TAKE ONE (1) TABLET EACH DAY 04/30/12  Yes Nelwyn Salisbury, MD  Cyanocobalamin (VITAMIN B 12 PO) Take 1 tablet by mouth daily.   Yes Historical Provider, MD  cyclobenzaprine (FLEXERIL) 10 MG tablet TAKE ONE (1) TABLET THREE (3) TIMES     A DAY IF NEEDED 06/02/12  Yes Nelwyn Salisbury, MD  docusate sodium (COLACE) 100 MG capsule Take 100 mg by mouth 2 (two) times daily.     Yes Historical Provider, MD  esomeprazole (NEXIUM) 40 MG capsule  Take 1 capsule (40 mg total) by mouth 2 (two) times daily. 10/21/12  Yes Nelwyn Salisbury, MD  Multiple Vitamins-Minerals (MULTIVITAMIN PO) Take by mouth daily.   Yes Historical Provider, MD  predniSONE (DELTASONE) 10 MG tablet Take 10 mg by mouth daily.   Yes Historical Provider, MD  traMADol (ULTRAM) 50 MG tablet TAKE 1 TO 2 TABLETS EVERY 6 HOURS AS    NEEDED FOR PAIN.                                                  GENERIC FOR Janean Sark 11/26/11  Yes Nelwyn Salisbury, MD  albuterol (PROVENTIL HFA;VENTOLIN HFA) 108 (90 BASE) MCG/ACT inhaler Inhale 2 puffs into the lungs every 4 (four) hours as needed for wheezing or shortness of breath. 02/27/12 02/26/13  Nelwyn Salisbury, MD  HYDROcodone-acetaminophen (NORCO/VICODIN) 5-325 MG per tablet Take 1 tablet by mouth every 6 (six) hours as needed for pain. 08/26/12   Nelwyn Salisbury, MD   No Known Allergies  FAMILY HISTORY:  History reviewed. No  pertinent family history. SOCIAL HISTORY:  reports that she has been smoking Cigarettes.  She has been smoking about 1.00 pack per day. She has never used smokeless tobacco. She reports that she does not drink alcohol or use illicit drugs.  REVIEW OF SYSTEMS:  N/a pt post op sedation  SUBJECTIVE:  Sleepy, responds "no" to cp or sob   VITAL SIGNS: Temp:  [97.4 F (36.3 C)-99.7 F (37.6 C)] 98.6 F (37 C) (06/05 1546) Pulse Rate:  [96-120] 96 (06/05 1618) Resp:  [11-20] 12 (06/05 1618) BP: (98-136)/(62-94) 109/82 mmHg (06/05 1611) SpO2:  [91 %-100 %] 95 % (06/05 1618) Weight:  [141 lb 12.1 oz (64.3 kg)-145 lb 8.1 oz (66 kg)] 145 lb 8.1 oz (66 kg) (06/05 1550) 02 rx  2lpm   HEMODYNAMICS:   VENTILATOR SETTINGS:   INTAKE / OUTPUT: Intake/Output     06/04 0701 - 06/05 0700 06/05 0701 - 06/06 0700   I.V. (mL/kg)  2200 (33.3)   Total Intake(mL/kg)  2200 (33.3)   Urine (mL/kg/hr)  535 (0.8)   Other  700 (1.1)   Blood  150 (0.2)   Total Output   1385   Net   +815        Emesis Occurrence  1 x     PHYSICAL EXAMINATION: General:  Elderly bf 10 > stated age No jvd/ nodes Lungs slt barrel chest, distant bs, no wheeze RRR  P low 100s ST with PAC abd mod distended diffusely tender, wound dressings dry Ext slt cool, not edema/ POS PAS Neuro: sedated but nods approp, moves all 4 to command  LABS:  Recent Labs Lab 11/26/12 2330 11/27/12 0530 11/27/12 1220  HGB 13.0 12.4  --   WBC 13.1* 8.7  --   PLT 230 221  --   NA 135 134*  --   K 3.3* 3.0*  --   CL 95* 97  --   CO2 31 28  --   GLUCOSE 97 108*  --   BUN 13 12  --   CREATININE 0.80 0.75  --   CALCIUM 9.0 8.2*  --   MG  --  1.3*  --   AST 19  --   --   ALT 14  --   --  ALKPHOS 63  --   --   BILITOT 0.6  --   --   PROT 6.9  --   --   ALBUMIN 3.4*  --   --   APTT  --   --  34  INR  --   --  1.13   No results found for this basename: GLUCAP,  in the last 168 hours     ASSESSMENT / PLAN:  PULMONARY A:  acute resp failure post op/ smoker prior to admit Well compensated on Nasal 02 low flow > no change rx  CARDIOVASCULAR A:Shock likely relatively dry, on norvasc and pred preop so hold norvasc (delayed effects may last a few days) and rx stress steroids as you are  RENAL A:  Mild/mod hypokalemia  P Replete   GASTROINTESTINAL A:  S/p lab 6/5 for per tic > colectomy and colostomy P Per CCS  HEMATOLOGIC A:  Acute blood loss anemia Threshold for tx at < 7 unless in shock then want over 9  INFECTIOUS A:  Peritonitis s/p perf tic  P Rx per dashboard  ENDOCRINE A:  Steroid dep chronically for dx of RA P  Stress HC IV   NEUROLOGIC A:  No issues     Overall she looks ok though could develop GI sepsis and needs to continue to keep ahead on fluids / stress steroids for now   I have personally obtained a history, examined the patient, evaluated laboratory and imaging results, formulated the assessment and plan and placed orders. CRITICAL CARE: The patient is critically ill with multiple organ systems failure and requires high complexity decision making for assessment and support, frequent evaluation and titration of therapies, application of advanced monitoring technologies and extensive interpretation of multiple databases. Critical Care Time devoted to patient care services described in this note is 45 minutes.    Sandrea Hughs, MD Pulmonary and Critical Care Medicine  Healthcare Cell [phone] After 5:30 PM or weekends, call [phone]

## 2012-11-28 ENCOUNTER — Encounter (HOSPITAL_COMMUNITY): Payer: Self-pay | Admitting: General Surgery

## 2012-11-28 DIAGNOSIS — M069 Rheumatoid arthritis, unspecified: Secondary | ICD-10-CM

## 2012-11-28 DIAGNOSIS — Q8740 Marfan's syndrome, unspecified: Secondary | ICD-10-CM

## 2012-11-28 DIAGNOSIS — R1032 Left lower quadrant pain: Secondary | ICD-10-CM

## 2012-11-28 DIAGNOSIS — I10 Essential (primary) hypertension: Secondary | ICD-10-CM

## 2012-11-28 LAB — BASIC METABOLIC PANEL
BUN: 19 mg/dL (ref 6–23)
CO2: 24 mEq/L (ref 19–32)
Calcium: 7.2 mg/dL — ABNORMAL LOW (ref 8.4–10.5)
Chloride: 102 mEq/L (ref 96–112)
Creatinine, Ser: 1.19 mg/dL — ABNORMAL HIGH (ref 0.50–1.10)
GFR calc Af Amer: 58 mL/min — ABNORMAL LOW (ref >90)
GFR calc non Af Amer: 50 mL/min — ABNORMAL LOW (ref >90)
Glucose, Bld: 111 mg/dL — ABNORMAL HIGH (ref 70–99)
Potassium: 4.7 mEq/L (ref 3.5–5.1)
Sodium: 137 mEq/L (ref 135–145)

## 2012-11-28 LAB — CBC
HCT: 38.2 % (ref 36.0–46.0)
Hemoglobin: 12.5 g/dL (ref 12.0–15.0)
MCH: 29.1 pg (ref 26.0–34.0)
MCHC: 32.7 g/dL (ref 30.0–36.0)
MCV: 88.8 fL (ref 78.0–100.0)
Platelets: 201 10*3/uL (ref 150–400)
RBC: 4.3 MIL/uL (ref 3.87–5.11)
RDW: 15.7 % — ABNORMAL HIGH (ref 11.5–15.5)
WBC: 20.1 10*3/uL — ABNORMAL HIGH (ref 4.0–10.5)

## 2012-11-28 LAB — MAGNESIUM
Magnesium: 1.4 mg/dL — ABNORMAL LOW (ref 1.5–2.5)
Magnesium: 2.2 mg/dL (ref 1.5–2.5)

## 2012-11-28 LAB — PATHOLOGIST SMEAR REVIEW

## 2012-11-28 MED ORDER — VITAMINS A & D EX OINT
TOPICAL_OINTMENT | CUTANEOUS | Status: AC
  Administered 2012-11-28: 20:00:00
  Filled 2012-11-28: qty 5

## 2012-11-28 MED ORDER — MAGNESIUM SULFATE 40 MG/ML IJ SOLN
2.0000 g | Freq: Once | INTRAMUSCULAR | Status: AC
  Administered 2012-11-28: 2 g via INTRAVENOUS
  Filled 2012-11-28: qty 50

## 2012-11-28 MED ORDER — HYDROCORTISONE SOD SUCCINATE 100 MG IJ SOLR
50.0000 mg | Freq: Two times a day (BID) | INTRAMUSCULAR | Status: DC
  Administered 2012-11-28 – 2012-12-05 (×14): 50 mg via INTRAVENOUS
  Filled 2012-11-28: qty 2
  Filled 2012-11-28 (×4): qty 1
  Filled 2012-11-28: qty 2
  Filled 2012-11-28 (×12): qty 1

## 2012-11-28 MED ORDER — PIPERACILLIN-TAZOBACTAM 3.375 G IVPB
3.3750 g | Freq: Three times a day (TID) | INTRAVENOUS | Status: DC
  Administered 2012-11-28 – 2012-12-04 (×17): 3.375 g via INTRAVENOUS
  Filled 2012-11-28 (×18): qty 50

## 2012-11-28 MED ORDER — LABETALOL HCL 5 MG/ML IV SOLN
10.0000 mg | INTRAVENOUS | Status: DC | PRN
  Administered 2012-11-30 – 2012-12-01 (×2): 10 mg via INTRAVENOUS
  Filled 2012-11-28 (×2): qty 4

## 2012-11-28 NOTE — Progress Notes (Signed)
[facility] ADULT ICU REPLACEMENT PROTOCOL FOR AM LAB REPLACEMENT ONLY  The patient does apply for the [facility] Adult ICU Electrolyte Replacment Protocol based on the criteria listed below:   1. Is GFR >/= 40 ml/min? yes  Patient's GFR today is 58 2. Is urine output >/= 0.5 ml/kg/hr for the last 6 hours? yes Patient's UOP is 1.35 ml/kg/hr 3. Is BUN < 60 mg/dL? yes  Patient's BUN today is 19 4. Abnormal electrolyte(s): mg 1.4 5. Ordered repletion with: see order 6. If a panic level lab has been reported, has the CCM MD in charge been notified? yes.   Physician:  Dr. Higinio Plan, Mannat Benedetti A 11/28/2012 6:41 AM

## 2012-11-28 NOTE — Progress Notes (Signed)
1 Day Post-Op  Subjective: She is alert and answers questions well.  No acute distress would like the NG out.  Objective: Vital signs in last 24 hours: Temp:  [97.4 F (36.3 C)-99.4 F (37.4 C)] 99.1 F (37.3 C) (06/06 0400) Pulse Rate:  [31-120] 103 (06/06 0600) Resp:  [11-21] 11 (06/06 0600) BP: (98-154)/(62-94) 150/77 mmHg (06/06 0600) SpO2:  [91 %-100 %] 100 % (06/06 0600) Weight:  [66 kg (145 lb 8.1 oz)-67 kg (147 lb 11.3 oz)] 67 kg (147 lb 11.3 oz) (06/06 0400) Last BM Date: 11/27/12 NG 280 recorded, urine 1740 ml recorded. Drain 700 recorded. 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  The 2 open areas are clean and pink.  no bowel sounds. Ostomy is covered in stool so I can't really see it.  Lab Results:   Recent Labs  11/27/12 1741 11/28/12 0330  WBC 16.7* 20.1*  HGB 12.5 12.5  HCT 38.0 38.2  PLT 221 201    BMET  Recent Labs  11/27/12 0530 11/28/12 0330  NA 134* 137  K 3.0* 4.7  CL 97 102  CO2 28 24  GLUCOSE 108* 111*  BUN 12 19  CREATININE 0.75 1.19*  CALCIUM 8.2* 7.2*   PT/INR  Recent Labs  11/27/12 1220  LABPROT 14.3  INR 1.13     Recent Labs Lab 11/26/12 2330  AST 19  ALT 14  ALKPHOS 63  BILITOT 0.6  PROT 6.9  ALBUMIN 3.4*     Lipase     Component Value Date/Time   LIPASE 31 11/26/2012 2330     Studies/Results: Ct Abdomen Pelvis W Contrast  11/27/2012   *RADIOLOGY REPORT*  Clinical Data: Abdominal and back pain.  CT ABDOMEN AND  PELVIS WITH CONTRAST  Technique:  Multidetector CT imaging of the abdomen and pelvis was performed following the standard protocol during bolus administration of intravenous contrast.  Contrast: OMNIPAQUE IOHEXOL 300 MG/ML  SOLN  Comparison: CT of the abdomen and pelvis from 11/07/2005  Findings: Minimal left basilar atelectasis is noted.  A calcified granuloma is noted at the hepatic dome.  Scattered hepatic cysts are again seen, the largest of which measures 2.0 cm in size.  The liver is otherwise unremarkable in appearance.  The spleen is mildly diminutive and grossly unremarkable.  The gallbladder is within normal limits.  There is dilatation of the pancreatic duct to 5-6 mm in diameter, mildly more prominent than on the prior study.  There is also distension of the common hepatic duct to 1.1 cm in diameter.  This is of uncertain etiology; distal obstruction cannot be entirely excluded.  The pancreas and adrenal glands are otherwise unremarkable in appearance.  A mildly heterogeneous relatively high attenuation 1.0 cm hypodensity is noted arising at the posteromedial aspect of the left kidney.  Though this could reflect a cyst, a small mass cannot be excluded.  The kidneys are otherwise unremarkable in appearance. There is no evidence of hydronephrosis.  No renal or ureteral stones are seen.  No perinephric  stranding is appreciated.  No free fluid is identified.  The small bowel is unremarkable in appearance.  The stomach is within normal limits.  No acute vascular abnormalities are seen.  There is relatively diffuse calcification along the abdominal aorta and its branches.  The abdominal aorta is tortuous in appearance.  There is significant wall thickening at the mid to distal sigmoid colon, with associated diffuse soft tissue inflammation.  A large 4.5 x 3.4 cm collection of air is noted adjacent to the mid sigmoid colon, with a small amount of air noted tracking superiorly along the corresponding  mesenteric vein.  This is compatible with a focal perforation.  A single tiny focus of free intraperitoneal air is noted along the anterior right hemipelvis.  Associated trace free fluid is seen.  Diffuse underlying diverticulosis is noted along the sigmoid colon, with more mild diverticulosis noted along the descending colon.  The transverse colon is mildly redundant.  The appendix is grossly normal in caliber, though not fully assessed; there is no evidence for appendicitis.  The bladder is moderately distended and grossly unremarkable in appearance.  The uterus is within normal limits.  The ovaries are not well assessed due to the inflammatory process within the pelvis.  No suspicious adnexal masses are seen.  No inguinal lymphadenopathy is seen.  No acute osseous abnormalities are identified.  Mild degenerative change is noted at the left hip joint.  A healed right inferior pubic ramus fracture is noted.  Significant left convex lumbar scoliosis is noted, with associated chronic degenerative change.  IMPRESSION:  1.  Significant acute diverticulitis at the mid to distal sigmoid colon, with associated focal perforation.  Large 4.5 x 3.4 cm collection of air noted at the mesentery adjacent to the sigmoid colon, with a small amount of air tracking superiorly along the corresponding mesenteric vein.  Tiny focus of free intraperitoneal air noted along the anterior right hemipelvis. 2.  Associated trace free fluid seen; diffuse underlying diverticulosis noted, with more mild diverticulosis along the descending colon. 3.  Dilatation of the pancreatic duct to 5-6 mm in diameter, with dilatation of the common hepatic duct to 1.1 cm in diameter. Distal obstruction cannot be excluded; MRCP would be helpful for further evaluation, when the patient is able to hold her breath for the study. 4.  Mildly heterogeneous relatively high attenuation 1.0 cm hypodensity at the posterior medial aspect of the left kidney, new from 2007.   Though this could reflect a cyst, a small mass cannot be excluded.  This could also be assessed when performing an MRCP as described above. 5.  Scattered hepatic cysts again noted. 6.  Relatively diffuse calcification along the abdominal aorta and its branches. 7.  Significant left convex lumbar scoliosis noted.  Critical Value/emergent results were called by telephone at the time of interpretation on 11/27/2012 at 12:42 a.m. to [facility], who verbally acknowledged these results.   Original Report Authenticated By: Tonia Ghent, M.D.    Medications: . antiseptic oral rinse  15 mL Mouth Rinse q12n4p  . chlorhexidine  15 mL Mouth Rinse BID  . enoxaparin (LOVENOX) injection  40 mg Subcutaneous Q24H  . hydrocortisone sod succinate (SOLU-CORTEF) inj  100 mg Intravenous Q8H  . pantoprazole (PROTONIX) IV  40 mg Intravenous Q24H  . piperacillin-tazobactam (ZOSYN)  IV  3.375 g Intravenous Q6H   . 0.9 % NaCl with KCl 20 mEq / L 125 mL/hr at 11/28/12 0248    Assessment/Plan Perforated diverticulitis. S/p Exploratory laparotomy with  sigmoid colectomy and diverting end colostomy with biopsy of peritoneal nodule. 11/28/2012, Lodema Pilot, DO. Hypotensive  Probable sepsis  Hypokalemia  Marfan's syndrome.  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  Chronic steroid use for her severe arthritis  Tobacco use  Hx of probable TIA 07/2006  Gout  Hx of ETOH dependence; now resolved.  Chronic constipation followed by Dr. Leone Payor  Hypertension  GERD  Dyslipidemia  Scoliosis  BMI 21.6    Plan:  I will replace her K+  500-600 ml from her NG, and I plan to leave it in.  Her creatinine bumped up so I will leave foley for today and make sure her u/o is good.  Hope to d/c tomorrow. Get her up to chair and as OT/PT/case manager to see and begin evaluation for d/c planning.  Replace K+, NPO x icechips. Labs in AM.       LOS: 2 days    JENNINGS,WILLARD 11/28/2012 She looks okay.   Abdomen still tender.  Creatinine up but uop okay.  Will follow this.  Dressing changes and ostomy care.  On stress dose steroids.

## 2012-11-28 NOTE — Progress Notes (Addendum)
PULMONARY  / CRITICAL CARE MEDICINE  Name: [name] MRN: [id number] DOB: Jan 06, 1956    ADMISSION DATE:  11/26/2012 CONSULTATION DATE:  11/27/12  REFERRING MD :  Biagio Quint PRIMARY SERVICE: Tawanna Sat Primary [facility]  CHIEF COMPLAINT:  abd pain, low pb  BRIEF PATIENT DESCRIPTION:  54 yobf smoker with Marfans and hbp on norvasc and pred daily  admit pm 6/4 with abd pain and underwent ex lap pm 6/5 with perf tic and would left open and transferred to ICU with soft bp's so PCCM service consulted.  SIGNIFICANT EVENTS / STUDIES:  6/5 Exp lap with sig colectomy/colostomy  LINES / TUBES:    CULTURES: 6/5  MRSA > neg   ANTIBIOTICS: Zoysn 6/5 >>>     SUBJECTIVE:  Sleepy, responds "no" to cp or sob   VITAL SIGNS: Temp:  [97.4 F (36.3 C)-99.4 F (37.4 C)] 99.1 F (37.3 C) (06/06 0400) Pulse Rate:  [31-120] 105 (06/06 0800) Resp:  [11-21] 12 (06/06 0800) BP: (98-154)/(62-94) 137/62 mmHg (06/06 0800) SpO2:  [91 %-100 %] 99 % (06/06 0800) Weight:  [66 kg (145 lb 8.1 oz)-67 kg (147 lb 11.3 oz)] 67 kg (147 lb 11.3 oz) (06/06 0400) 02 rx  2lpm   HEMODYNAMICS:   VENTILATOR SETTINGS:   INTAKE / OUTPUT: Intake/Output     06/05 0701 - 06/06 0700 06/06 0701 - 06/07 0700   I.V. (mL/kg) 4115.4 (61.4) 250 (3.7)   IV Piggyback 137.5 12.5   Total Intake(mL/kg) 4252.9 (63.5) 262.5 (3.9)   Urine (mL/kg/hr) 1740 (1.1) 115 (0.7)   Emesis/NG output 280 (0.2)    Other 700 (0.4)    Blood 150 (0.1)    Total Output 2870 115   Net +1382.9 +147.5        Emesis Occurrence 1 x      PHYSICAL EXAMINATION: General:  Elderly bf older than stated age. Blind No jvd/ nodes Lungs slt barrel chest, distant bs, no wheeze RRR  P low 100s ST with PAC abd mod distended diffusely tender, wound dressings dry, colostomy with feces  Ext slt cool, not edema/ POS PAS Neuro: sedated but nods approp, moves all 4 to command  LABS:  Recent Labs Lab 11/26/12 2330 11/27/12 0530 11/27/12 1220  11/27/12 1741 11/28/12 0330  HGB 13.0 12.4  --  12.5 12.5  WBC 13.1* 8.7  --  16.7* 20.1*  PLT 230 221  --  221 201  NA 135 134*  --   --  137  K 3.3* 3.0*  --   --  4.7  CL 95* 97  --   --  102  CO2 31 28  --   --  24  GLUCOSE 97 108*  --   --  111*  BUN 13 12  --   --  19  CREATININE 0.80 0.75  --   --  1.19*  CALCIUM 9.0 8.2*  --   --  7.2*  MG  --  1.3*  --   --  1.4*  AST 19  --   --   --   --   ALT 14  --   --   --   --   ALKPHOS 63  --   --   --   --   BILITOT 0.6  --   --   --   --   PROT 6.9  --   --   --   --   ALBUMIN 3.4*  --   --   --   --  APTT  --   --  34  --   --   INR  --   --  1.13  --   --    No results found for this basename: GLUCAP,  in the last 168 hours     ASSESSMENT / PLAN:  PULMONARY A: Atelectasis. P: Oxygen as needed Bronchial hygiene  CARDIOVASCULAR A: Hypotension >> resolved. Hx of HTN. P: Resume oral anti-HTN meds when able to swallow PRN labetalol for elevated blood pressure  RENAL A:  Hypokalemia  P: F/u BMET  GASTROINTESTINAL A:  S/p lab 6/5 for per tic > colectomy and colostomy P: Per CCS  HEMATOLOGIC A:  Acute blood loss anemia P: F/u CBC Transfuse for Hb < 7  INFECTIOUS A:  Peritonitis s/p perf tic  P: Continue zosyn per [facility]CS  ENDOCRINE A:  Steroid dep chronically for dx of RA P: Change solucortef to 50 q12h Resume prednisone when able to swallow  NEUROLOGIC A: Pain control. P: Per [facility]CS    Overall stable, multiple health issues.  PCCM available as needed.    Brett Canales Minor ACNP [facility] PCCM Pager [phone] till 3 pm If no answer page [phone] 11/28/2012, 9:23 AM   Reviewed above, examined pt, and agree with assessment/plan.  Decrease solucortef to 50 mg q12h >> transition back to prednisone when able to take oral medications.  PCCM will sign off.  If additional medical assistance needed, then recommend consulting [facility].  Coralyn Helling, MD [facility] Pulmonary/Critical Care 11/28/2012, 10:50  AM Pager:  [phone] After 3pm call: [phone]

## 2012-11-28 NOTE — Consult Note (Signed)
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 She still has her NG and just put her hearing aids in when I arrived.  Did not want to answer many questions.  But her daughter is very well informed (has been on You tube for several hours last night).   Stoma type/location: LLQ, end stoma.  Stomal assessment/size: observation thru pouch (POD 1), pink and moist aprox. 1 3/4" round, budded Peristomal assessment: no pouch change today Output pasty yellow-brown stool in pouch Ostomy pouching: 1pc.from OR in place, 2pc. Supplies ordered for staff and WOC for weekend. Education provided: left colostomy education with the daughter, she was able to verbalize lots of information she had learned online last evening.   We discussed the difference in one pc and two pc. Pouches and she was again very informed on this.  The issue we discussed that with her RA she will not be able to cut pouches, but the daughter reports that she will precut some pouches and she will assist with care of stoma.  Will need to have patient work with lock and roll closure, since she will be doing this by touch rather than site.   WOC team will follow along with you for ostomy teaching and support Dhwani Venkatesh [place], [place] [phone]

## 2012-11-28 NOTE — Op Note (Signed)
NAMEKATURA, EATHERLY NO.:  [id number]  MEDICAL RECORD NO.:  [id number]  LOCATION:  1230                         FACILITY:  [facility]  PHYSICIAN:  Lodema Pilot, MD       DATE OF BIRTH:  04/16/1956  DATE OF PROCEDURE:  11/27/2012 DATE OF DISCHARGE:                              OPERATIVE REPORT   PREOPERATIVE DIAGNOSIS:  Perforated diverticulitis.  POSTOPERATIVE DIAGNOSIS:  Perforated diverticulitis.  PROCEDURE:  Exploratory laparotomy with sigmoid colectomy and diverting end colostomy with biopsy of peritoneal nodule.  SURGEON:  Lodema Pilot, MD  ASSISTANT:  Clovis Pu. Cornett, M.D.  ANESTHESIA:  General endotracheal tube anesthesia.  FLUIDS:  2200 mL of crystalloid.  ESTIMATED BLOOD LOSS:  150 mL.  DRAINS:  None.  SPECIMENS: 1. Sigmoid colon with a suture marking proximally and sent to     Pathology for permanent sectioning. 2. Peritoneal nodule sent to Pathology for permanent sectioning.  COMPLICATIONS:  None apparent.  FINDINGS:  Thickened and inflamed sigmoid colon consistent with a perforated diverticulitis or even possible perforated malignancy, diffuse purulent peritonitis with localized stool contamination in the pelvis, diverting end colostomy, and very thin paper like fascia.  INDICATION FOR PROCEDURE:  Ms. Biswell is a 57 year old female admitted last night for treatment of diverticulitis.  She has continued to worsen and increasing abdominal pain, hypertension, and given her steroid use and concern for perforated diverticulitis.  DESCRIPTION OF PROCEDURE:  Ms. Guimond was seen and evaluated in the hospital ward.  The risks and benefits, and alternatives were discussed with the patient.  I also called the patient's daughter and discussed with her on the phone as well.  She was already on therapeutic antibiotics.  She was taken to the operating room, placed on table in supine position.  General endotracheal tube anesthesia was obtained  and Foley catheter was placed.  Her abdomen was prepped and draped in a standard surgical fashion and lower midline incision was made in the skin and dissection was carried down through the subcutaneous tissues using Bovie electrocautery.  Her fascia was very thin, but she really did not have much subcu tissue.  Peritoneum was entered and the fascia was opened along the length of the incision.  She had purulent fluid throughout her abdomen and  fibrinous exudate on her small bowel diffusely as well as very thickened and inflamed area in the sigmoid colon with area of visible perforation and localized stool contaminations in the immediate area as well as perforation into the mesentry of the rectosigmoid junction with small abscess in the area. The NG tube was placed by the anesthesia, confirmed to be in the stomach and the small bowel was retracted into the right upper quadrant.  Self retaining retractors were placed and this was noted to be the area of obvious perforation.  She actually had a fairly mobile sigmoid colon and created area through the mesocolon proximal to the area of the thickened and inflamed tissue and divided the colon in this area with a green contour stapler.  We then identified the left ureter and divided the mesentery of the colon staying high up near the intestine in order to minimize the risk of  injury to the ureters and retroperitoneal structures.  This division was carried down into the pelvis, an area of normal-appearing rectum and distal sigmoid was identified.  It was actually fairly soft and uninvolved with inflammatory change.  A window was created through the mesentery in this area and the rectosigmoid area was divided with another firing of the green contour stapler and continued to undermine the mesentery until the resection of bowel was completely removed and suture was placed on the proximal margin of the colon and this was sent to Pathology for  permanent sectioning.  The mesentery was inspected for hemostasis, which was noted to be adequate and the ureter was again identified and noted to be uninvolved.  The rectal stump was tagged at each corner with a 2-0 Prolene suture for later identification in the case of possible colostomy reversal.  The abdomen was then irrigated with several liters of sterile saline solution and I palpated the liver, and I did not palpate any suspicious nodules and the NG tube was confirmed to be in the antrum.  Remainder of the colon was grossly normal, although she did have some cecal dilation, which appeared to be gaseous distention and the fibrinous exudate on it, but otherwise appeared normal.  We mobilized a small amount of white line of Toldt on the left lateral sidewall in order to obtain more mobility of the descending colon for colostomy creation and the omentum was placed over the bowel.  The area was selected through the left rectus for creation of an ostomy and a circular incision was made on the skin and the fascia again was very thin, but cruciate incision was made in the fascia and proximal staple line was brought up through the abdominal wall and certified that this was not twisted.  It was sutured to the fascia with interrupted 2-0 Vicryl sutures and it was not matured at this time.  The midline fascia was then approximated with a 0 PDS suture x2 taking care to avoid injury to the underlying bowel.  However, her fascia was very weak and paper thin.  The fascia was well approximated and actually placed a couple of interrupted stitches in areas of concern with 0 PDS sutures.  The wound was approximated with some of the skin around the umbilicus and the remainder of the wound was left open and irrigated and packed with dry gauze.  This wound was then covered and the ostomy was matured by removing the staple line and 4 quadrant Brooke sutures were placed in order to evert the ostomy.   After these 4 quadrant sutures were placed, the sutures were secured and then using several 3-0 Vicryl sutures the ostomy was matured to the skin. Ostomy was digitalized and noted to be patent and functioning and ostomy appliance was placed.  She tolerated the procedure well, and all sponge, needle, and instrument counts were correct at the end the case.  She was stable and ready to transfer to the recovery room, and later to be transferred to the ICU for continued monitoring.          ______________________________ Lodema Pilot, MD     BL/MEDQ  D:  11/27/2012  T:  11/28/2012  Job:  [id number]

## 2012-11-29 LAB — CBC
HCT: 28.4 % — ABNORMAL LOW (ref 36.0–46.0)
Hemoglobin: 9.5 g/dL — ABNORMAL LOW (ref 12.0–15.0)
MCH: 29.1 pg (ref 26.0–34.0)
MCHC: 33.5 g/dL (ref 30.0–36.0)
MCV: 87.1 fL (ref 78.0–100.0)
Platelets: 174 10*3/uL (ref 150–400)
RBC: 3.26 MIL/uL — ABNORMAL LOW (ref 3.87–5.11)
RDW: 15.9 % — ABNORMAL HIGH (ref 11.5–15.5)
WBC: 16.3 10*3/uL — ABNORMAL HIGH (ref 4.0–10.5)

## 2012-11-29 LAB — BASIC METABOLIC PANEL
BUN: 14 mg/dL (ref 6–23)
CO2: 25 mEq/L (ref 19–32)
Calcium: 7.2 mg/dL — ABNORMAL LOW (ref 8.4–10.5)
Chloride: 108 mEq/L (ref 96–112)
Creatinine, Ser: 0.76 mg/dL (ref 0.50–1.10)
GFR calc Af Amer: >90 mL/min (ref >90)
GFR calc non Af Amer: >90 mL/min (ref >90)
Glucose, Bld: 88 mg/dL (ref 70–99)
Potassium: 3.4 mEq/L — ABNORMAL LOW (ref 3.5–5.1)
Sodium: 140 mEq/L (ref 135–145)

## 2012-11-29 LAB — MAGNESIUM: Magnesium: 2.3 mg/dL (ref 1.5–2.5)

## 2012-11-29 MED ORDER — MORPHINE SULFATE (PF) 1 MG/ML IV SOLN
INTRAVENOUS | Status: DC
  Administered 2012-11-29: 10:00:00 via INTRAVENOUS
  Administered 2012-11-29: 2 mg via INTRAVENOUS
  Administered 2012-11-30: 1 mg via INTRAVENOUS
  Administered 2012-11-30: 2 mg via INTRAVENOUS
  Administered 2012-11-30 – 2012-12-01 (×2): 1 mg via INTRAVENOUS
  Filled 2012-11-29: qty 25

## 2012-11-29 MED ORDER — DIPHENHYDRAMINE HCL 12.5 MG/5ML PO ELIX: 12.5000 mg | ORAL_SOLUTION | Freq: Four times a day (QID) | ORAL | Status: DC | PRN

## 2012-11-29 MED ORDER — POTASSIUM CHLORIDE 10 MEQ/100ML IV SOLN
10.0000 meq | INTRAVENOUS | Status: AC
  Administered 2012-11-29 (×4): 10 meq via INTRAVENOUS
  Filled 2012-11-29: qty 400

## 2012-11-29 MED ORDER — NALOXONE HCL 0.4 MG/ML IJ SOLN: 0.4000 mg | INTRAMUSCULAR | Status: DC | PRN

## 2012-11-29 MED ORDER — ONDANSETRON HCL 4 MG/2ML IJ SOLN: 4.0000 mg | Freq: Four times a day (QID) | INTRAMUSCULAR | Status: DC | PRN

## 2012-11-29 MED ORDER — KCL IN DEXTROSE-NACL 20-5-0.45 MEQ/L-%-% IV SOLN
INTRAVENOUS | Status: DC
  Administered 2012-11-29 – 2012-12-01 (×4): via INTRAVENOUS
  Administered 2012-12-01: 125 mL via INTRAVENOUS
  Administered 2012-12-01 – 2012-12-03 (×4): via INTRAVENOUS
  Filled 2012-11-29 (×14): qty 1000

## 2012-11-29 MED ORDER — DIPHENHYDRAMINE HCL 50 MG/ML IJ SOLN: 12.5000 mg | Freq: Four times a day (QID) | INTRAMUSCULAR | Status: DC | PRN

## 2012-11-29 MED ORDER — SODIUM CHLORIDE 0.9 % IJ SOLN: 9.0000 mL | INTRAMUSCULAR | Status: DC | PRN

## 2012-11-29 NOTE — Progress Notes (Signed)
2 Days Post-Op  Subjective: No complaints  Objective: Vital signs in last 24 hours: Temp:  [98.3 F (36.8 C)-99.8 F (37.7 C)] 98.3 F (36.8 C) (06/07 0400) Pulse Rate:  [77-105] 95 (06/07 0400) Resp:  [12-21] 15 (06/07 0400) BP: (124-169)/(62-90) 143/68 mmHg (06/07 0400) SpO2:  [80 %-100 %] 96 % (06/07 0400) Weight:  [149 lb 0.5 oz (67.6 kg)] 149 lb 0.5 oz (67.6 kg) (06/07 0400) Last BM Date: 11/27/12  Intake/Output from previous day: 06/06 0701 - 06/07 0700 In: 2115.8 [P.O.:60; I.V.:1918.3; IV Piggyback:137.5] Out: 2625 [Urine:1455; Emesis/NG output:1170] Intake/Output this shift:    General appearance: cooperative and no distress Resp: nonlabored Cardio: normal rate, regular GI: soft, appropriate tenderness, wound without infection, wound packed, ostomy viable and functioning, NG with bilious output Extremities: SCD's bilat  Lab Results:   Recent Labs  11/28/12 0330 11/29/12 0355  WBC 20.1* 16.3*  HGB 12.5 9.5*  HCT 38.2 28.4*  PLT 201 174   BMET  Recent Labs  11/28/12 0330 11/29/12 0355  NA 137 140  K 4.7 3.4*  CL 102 108  CO2 24 25  GLUCOSE 111* 88  BUN 19 14  CREATININE 1.19* 0.76  CALCIUM 7.2* 7.2*   PT/INR  Recent Labs  11/27/12 1220  LABPROT 14.3  INR 1.13   ABG No results found for this basename: PHART, PCO2, PO2, HCO3,  in the last 72 hours  Studies/Results: No results found.  Anti-infectives: Anti-infectives   Start     Dose/Rate Route Frequency Ordered Stop   11/28/12 2200  piperacillin-tazobactam (ZOSYN) IVPB 3.375 g     3.375 g 12.5 mL/hr over 240 Minutes Intravenous 3 times per day 11/28/12 1332     11/28/12 0000  ertapenem (INVANZ) 1 g in sodium chloride 0.9 % 50 mL IVPB  Status:  Discontinued     1 g 100 mL/hr over 30 Minutes Intravenous Every 24 hours 11/27/12 0234 11/27/12 1120   11/27/12 1200  piperacillin-tazobactam (ZOSYN) IVPB 3.375 g  Status:  Discontinued     3.375 g 12.5 mL/hr over 240 Minutes Intravenous 4  times per day 11/27/12 1120 11/28/12 1332   11/27/12 0100  ertapenem (INVANZ) 1 g in sodium chloride 0.9 % 50 mL IVPB     1 g 100 mL/hr over 30 Minutes Intravenous  Once 11/27/12 0049 11/27/12 0130      Assessment/Plan: s/p Procedure(s): EXPLORATORY LAPAROTOMY SIGMOID COLECTOMY, COLOSTOMY (N/A) d/c foley Continue ABX therapy due to bowel perforation Wound care UOP okay and creatinine improved.   Continue NG due to large output of bilious fluid.   Should be okay for stepdown or even floor but concerned that she may not be able to get adequate pain control and call for nurse when needed.  Will see how she does in stepdown prior to transfer to floor.  Trial PCA. Continue steroids  LOS: 3 days    [name] DAVID 11/29/2012

## 2012-11-29 NOTE — Evaluation (Signed)
Physical Therapy Evaluation Patient Details Name: [name] MRN: [id number] DOB: 1955-11-21 Today's Date: 11/29/2012 Time: 4098-1191 PT Time Calculation (min): 22 min  PT Assessment / Plan / Recommendation Clinical Impression  Pt presents with shock s/p exploratory laparoscopy with NG tube placement and colostomy.  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   Tolerated OOB and ambulation in hallway well with RW and +2 assist for safety and line management.  Pt will benefit from skilled PT in acute venue to address deficits.  PT recommends SNFfor follow up at D/C to maximize pts safety.  She agrees and states she has been to [facility].      PT Assessment  Patient needs continued PT services    Follow Up Recommendations  SNF;Supervision/Assistance - 24 hour    Does the patient have the potential to tolerate intense rehabilitation      Barriers to Discharge Decreased caregiver support      Equipment Recommendations  None recommended by PT    Recommendations for Other Services OT consult   Frequency Min 3X/week    Precautions / Restrictions Precautions Precautions: Fall Precaution Comments: NG tube, colostomy, Marfans syndrome, severe RA in hands Restrictions Weight Bearing Restrictions: No   Pertinent Vitals/Pain Pt with pain in abdomen, however RN premedicated      Mobility  Bed Mobility Bed Mobility: Supine to Sit Supine to Sit: 1: +2 Total assist;HOB elevated Supine to Sit: Patient Percentage: 50% Details for Bed Mobility Assistance: Pt able to bring LEs out of bed with some assist for trunk to attain sitting position. Manual cues and assist for hand placement and technique.  Transfers Transfers: Sit to Stand;Stand to Sit Sit to Stand: 1: +2 Total assist;With upper extremity assist;From bed Stand to Sit: 1: +2 Total assist;With upper extremity assist;With armrests;To chair/3-in-1 Stand to Sit: Patient Percentage: 60% Details  for Transfer Assistance: Assist to rise, steady, and ensure controlled descent with manual and verbal cues for hand placement and safety.  Ambulation/Gait Ambulation/Gait Assistance: 1: +2 Total assist Ambulation/Gait: Patient Percentage: 60% Ambulation Distance (Feet): 120 Feet Assistive device: Rolling walker Ambulation/Gait Assistance Details: Manual and verbal cues for negotiation of RW and safety. Note that pt is moderately unsteady and that quads were weak with ambulation.  Gait Pattern: Step-through pattern;Decreased stride length;Wide base of support;Trunk flexed Gait velocity: decreased    Exercises     PT Diagnosis: Difficulty walking;Generalized weakness;Acute pain  PT Problem List: Decreased strength;Decreased activity tolerance;Decreased balance;Decreased mobility;Decreased coordination;Decreased knowledge of use of DME;Decreased safety awareness;Decreased knowledge of precautions;Pain PT Treatment Interventions: DME instruction;Gait training;Functional mobility training;Therapeutic activities;Therapeutic exercise;Balance training;Patient/family education   PT Goals Acute Rehab PT Goals PT Goal Formulation: With patient Time For Goal Achievement: 12/06/12 Potential to Achieve Goals: Good Pt will go Supine/Side to Sit: with min assist PT Goal: Supine/Side to Sit - Progress: Goal set today Pt will go Sit to Supine/Side: with min assist PT Goal: Sit to Supine/Side - Progress: Goal set today Pt will go Sit to Stand: with supervision PT Goal: Sit to Stand - Progress: Goal set today Pt will go Stand to Sit: with supervision PT Goal: Stand to Sit - Progress: Goal set today Pt will Ambulate: 51 - 150 feet;with min assist;with least restrictive assistive device PT Goal: Ambulate - Progress: Goal set today Pt will Perform Home Exercise Program: with supervision, verbal cues required/provided PT Goal: Perform Home Exercise Program - Progress: Goal set today  Visit Information  Last PT Received On: 11/29/12 Assistance Needed: +2    Subjective Data  Subjective: Ouch! Patient Stated Goal: to get stronger   Prior Functioning  Home Living Lives With: Alone Available Help at Discharge: Personal care attendant Type of Home: House Home Access: Level entry Home Layout: One level Bathroom Shower/Tub: Health visitor: Handicapped height Home Adaptive Equipment: Straight cane;Walker - rolling;Shower chair with back Prior Function Level of Independence: Needs assistance Able to Take Stairs?: Yes Driving: No Vocation: Unemployed Communication Communication: Surveyor, mining Arousal/Alertness: Awake/alert Behavior During Therapy: WFL for tasks assessed/performed Overall Cognitive Status: Within Functional Limits for tasks assessed    Extremity/Trunk Assessment Right Lower Extremity Assessment RLE ROM/Strength/Tone: Deficits RLE ROM/Strength/Tone Deficits: Pt with generalized weakness, grossly 3/5 per functional observation.  RLE Sensation: WFL - Light Touch Left Lower Extremity Assessment LLE ROM/Strength/Tone: Deficits LLE ROM/Strength/Tone Deficits: Pt with generalized weakness, grossly 3/5 per functional observation.  LLE Sensation: WFL - Light Touch Trunk Assessment Trunk Assessment: Kyphotic   Balance    End of Session PT - End of Session Activity Tolerance: Patient limited by pain;Patient limited by fatigue;Patient tolerated treatment well Patient left: in chair;with call bell/phone within reach Nurse Communication: Mobility status  GP     Vista Deck 11/29/2012, 4:24 PM

## 2012-11-30 LAB — CBC WITH DIFFERENTIAL/PLATELET
Basophils Absolute: 0 10*3/uL (ref 0.0–0.1)
Basophils Relative: 0 % (ref 0–1)
Eosinophils Absolute: 0.1 10*3/uL (ref 0.0–0.7)
Eosinophils Relative: 0 % (ref 0–5)
HCT: 28.3 % — ABNORMAL LOW (ref 36.0–46.0)
Hemoglobin: 9.4 g/dL — ABNORMAL LOW (ref 12.0–15.0)
Lymphocytes Relative: 4 % — ABNORMAL LOW (ref 12–46)
Lymphs Abs: 0.7 10*3/uL (ref 0.7–4.0)
MCH: 29.1 pg (ref 26.0–34.0)
MCHC: 33.2 g/dL (ref 30.0–36.0)
MCV: 87.6 fL (ref 78.0–100.0)
Monocytes Absolute: 0.5 10*3/uL (ref 0.1–1.0)
Monocytes Relative: 3 % (ref 3–12)
Neutro Abs: 13.8 10*3/uL — ABNORMAL HIGH (ref 1.7–7.7)
Neutrophils Relative %: 92 % — ABNORMAL HIGH (ref 43–77)
Platelets: 186 10*3/uL (ref 150–400)
RBC: 3.23 MIL/uL — ABNORMAL LOW (ref 3.87–5.11)
RDW: 15.9 % — ABNORMAL HIGH (ref 11.5–15.5)
WBC: 15 10*3/uL — ABNORMAL HIGH (ref 4.0–10.5)

## 2012-11-30 LAB — BASIC METABOLIC PANEL
BUN: 11 mg/dL (ref 6–23)
CO2: 24 mEq/L (ref 19–32)
Calcium: 7.5 mg/dL — ABNORMAL LOW (ref 8.4–10.5)
Chloride: 109 mEq/L (ref 96–112)
Creatinine, Ser: 0.61 mg/dL (ref 0.50–1.10)
GFR calc Af Amer: >90 mL/min (ref >90)
GFR calc non Af Amer: >90 mL/min (ref >90)
Glucose, Bld: 130 mg/dL — ABNORMAL HIGH (ref 70–99)
Potassium: 4 mEq/L (ref 3.5–5.1)
Sodium: 139 mEq/L (ref 135–145)

## 2012-11-30 NOTE — Progress Notes (Signed)
3 Days Post-Op  Subjective: Complains that ng bothers her.  Objective: Vital signs in last 24 hours: Temp:  [98.8 F (37.1 C)-99.7 F (37.6 C)] 99.7 F (37.6 C) (06/08 0400) Pulse Rate:  [65-86] 65 (06/08 0400) Resp:  [12-18] 13 (06/08 0539) BP: (122-163)/(74-95) 147/78 mmHg (06/08 0400) SpO2:  [96 %-100 %] 100 % (06/08 0539) Weight:  [146 lb 2.6 oz (66.3 kg)] 146 lb 2.6 oz (66.3 kg) (06/08 0400) Last BM Date: 11/28/12 (Smear)  Intake/Output from previous day: 06/07 0701 - 06/08 0700 In: 3229.2 [I.V.:2556.7; IV Piggyback:562.5] Out: 825 [Urine:825] Intake/Output this shift: Total I/O In: 1350 [I.V.:1250; IV Piggyback:100] Out: 350 [Urine:350]  GI: soft, nontender. ostomy pink and productive. dressing clean  Lab Results:   Recent Labs  11/29/12 0355 11/30/12 0349  WBC 16.3* 15.0*  HGB 9.5* 9.4*  HCT 28.4* 28.3*  PLT 174 186   BMET  Recent Labs  11/29/12 0355 11/30/12 0349  NA 140 139  K 3.4* 4.0  CL 108 109  CO2 25 24  GLUCOSE 88 130*  BUN 14 11  CREATININE 0.76 0.61  CALCIUM 7.2* 7.5*   PT/INR  Recent Labs  11/27/12 1220  LABPROT 14.3  INR 1.13   ABG No results found for this basename: PHART, PCO2, PO2, HCO3,  in the last 72 hours  Studies/Results: No results found.  Anti-infectives: Anti-infectives   Start     Dose/Rate Route Frequency Ordered Stop   11/28/12 2200  piperacillin-tazobactam (ZOSYN) IVPB 3.375 g     3.375 g 12.5 mL/hr over 240 Minutes Intravenous 3 times per day 11/28/12 1332     11/28/12 0000  ertapenem (INVANZ) 1 g in sodium chloride 0.9 % 50 mL IVPB  Status:  Discontinued     1 g 100 mL/hr over 30 Minutes Intravenous Every 24 hours 11/27/12 0234 11/27/12 1120   11/27/12 1200  piperacillin-tazobactam (ZOSYN) IVPB 3.375 g  Status:  Discontinued     3.375 g 12.5 mL/hr over 240 Minutes Intravenous 4 times per day 11/27/12 1120 11/28/12 1332   11/27/12 0100  ertapenem (INVANZ) 1 g in sodium chloride 0.9 % 50 mL IVPB     1  g 100 mL/hr over 30 Minutes Intravenous  Once 11/27/12 0049 11/27/12 0130      Assessment/Plan: [name]/p Procedure([name]): EXPLORATORY LAPAROTOMY SIGMOID COLECTOMY, COLOSTOMY (N/A) D/C ng Start clears Continue abx Continue to monitor in icu  LOS: 4 days    [name],[name] 11/30/2012

## 2012-11-30 NOTE — Progress Notes (Signed)
Physical Therapy Treatment Patient Details Name: [name] MRN: [id number] DOB: 10/17/1955 Today's Date: 11/30/2012 Time: 4098-1191 PT Time Calculation (min): 27 min  PT Assessment / Plan / Recommendation Comments on Treatment Session  Pt making good progress with mobility and feel that she would be a great candidate for CIR.  Will order consult.     Follow Up Recommendations  CIR;Supervision/Assistance - 24 hour     Does the patient have the potential to tolerate intense rehabilitation     Barriers to Discharge        Equipment Recommendations  None recommended by PT    Recommendations for Other Services    Frequency Min 3X/week   Plan Discharge plan needs to be updated    Precautions / Restrictions Precautions Precautions: Fall Precaution Comments:  colostomy, blind, Marfans syndrome, severe RA in hands Restrictions Weight Bearing Restrictions: No   Pertinent Vitals/Pain Pt continues to have some pain in abdomen, more with transitional movements.     Mobility  Bed Mobility Bed Mobility: Supine to Sit Supine to Sit: 3: Mod assist;HOB elevated (HOB 30 degs) Sitting - Scoot to Edge of Bed: 2: Max assist Details for Bed Mobility Assistance: Pt able to bring LEs out of bed today with assist for trunk.  Transfers Transfers: Sit to Stand;Stand to Sit Sit to Stand: 3: Mod assist;With upper extremity assist;From bed;From chair/3-in-1 Stand to Sit: 3: Mod assist;With upper extremity assist;With armrests;To chair/3-in-1 Details for Transfer Assistance: Performed x 2 in order to use 3in1 in restroom with verbal and manual cuing for hand placement and safety.  Ambulation/Gait Ambulation/Gait Assistance: 3: Mod assist (+2 for line managemetn. ) Ambulation Distance (Feet): 80 Feet (and another 15') Assistive device: Rolling walker Ambulation/Gait Assistance Details: Cues and assist for negotiating RW in hallway and in restroom.  Gait Pattern: Step-through pattern;Decreased  stride length;Wide base of support;Trunk flexed Gait velocity: decreased    Exercises     PT Diagnosis:    PT Problem List:   PT Treatment Interventions:     PT Goals Acute Rehab PT Goals PT Goal Formulation: With patient Time For Goal Achievement: 12/06/12 Potential to Achieve Goals: Good Pt will go Supine/Side to Sit: with min assist PT Goal: Supine/Side to Sit - Progress: Progressing toward goal Pt will go Sit to Stand: with supervision PT Goal: Sit to Stand - Progress: Progressing toward goal Pt will go Stand to Sit: with supervision PT Goal: Stand to Sit - Progress: Progressing toward goal Pt will Ambulate: 51 - 150 feet;with min assist;with least restrictive assistive device PT Goal: Ambulate - Progress: Progressing toward goal  Visit Information  Last PT Received On: 11/30/12 Assistance Needed: +2 (equipment. )    Subjective Data  Subjective: I don't want to get out of bed yet.  Patient Stated Goal: to get stronger   Cognition  Cognition Arousal/Alertness: Awake/alert Behavior During Therapy: WFL for tasks assessed/performed Overall Cognitive Status: Within Functional Limits for tasks assessed    Balance     End of Session PT - End of Session Equipment Utilized During Treatment: Gait belt Activity Tolerance: Patient tolerated treatment well;Patient limited by fatigue Patient left: in chair;with call bell/phone within reach Nurse Communication: Mobility status   GP     Vista Deck 11/30/2012, 10:38 AM

## 2012-11-30 NOTE — Evaluation (Signed)
Occupational Therapy Evaluation Patient Details Name: [name] MRN: [id number] DOB: 1955-07-24 Today's Date: 11/30/2012 Time: 9147-8295 OT Time Calculation (min): 28 min  OT Assessment / Plan / Recommendation Clinical Impression   This 57 yo female admitted with abd pain and now s/p EXPLORATORY LAPAROTOMY SIGMOID COLECTOMY, COLOSTOMY and h/o Marfan's syndrome, legally blind, and has hearing aids presents to acute OT with problems below. Will benefit from acute OT with follow up on CIR to get to a Mod I level.    OT Assessment  Patient needs continued OT Services    Follow Up Recommendations  CIR    Barriers to Discharge Decreased caregiver support    Equipment Recommendations   (TBD next venue)       Frequency  Min 2X/week    Precautions / Restrictions Precautions Precautions: Fall Precaution Comments:  colostomy, blind, Marfans syndrome, HOH Restrictions Weight Bearing Restrictions: No       ADL  Eating/Feeding: Simulated;Set up;Supervision/safety Where Assessed - Eating/Feeding: Chair Grooming: Simulated;Set up;Supervision/safety Where Assessed - Grooming: Supported sitting Upper Body Bathing: Simulated;Set up;Supervision/safety Where Assessed - Upper Body Bathing: Supported sitting Lower Body Bathing: Simulated;Minimal assistance Where Assessed - Lower Body Bathing: Supported sit to stand Upper Body Dressing: Simulated;Minimal assistance Where Assessed - Upper Body Dressing: Supported sitting Lower Body Dressing: Simulated;Maximal assistance Where Assessed - Lower Body Dressing: Supported sit to Pharmacist, hospital: Performed;Moderate assistance Toilet Transfer Method: Sit to stand Toilet Transfer Equipment: Raised toilet seat with arms (or 3-in-1 over toilet) Toileting - Clothing Manipulation and Hygiene: Performed;Minimal assistance Where Assessed - Glass blower/designer Manipulation and Hygiene: Standing Equipment Used: Rolling walker;Gait  belt Transfers/Ambulation Related to ADLs: Min A  for sit to stand and stand to sit, Mod A for ambulation ADL Comments: "If it talks to you, I probably have it" (in regards to her watch that talks the time and then I asked her if she had anything else)    OT Diagnosis: Generalized weakness;Disturbance of vision  OT Problem List: Decreased strength;Decreased range of motion;Decreased coordination;Impaired balance (sitting and/or standing);Impaired vision/perception;Impaired UE functional use OT Treatment Interventions: Self-care/ADL training;Balance training;Therapeutic activities;Patient/family education;DME and/or AE instruction   OT Goals Acute Rehab OT Goals OT Goal Formulation: With patient Time For Goal Achievement: 12/14/12 Potential to Achieve Goals: Good ADL Goals Pt Will Perform Grooming: with set-up;with supervision;Unsupported;Standing at sink (2 tasks) ADL Goal: Grooming - Progress: Goal set today Pt Will Perform Upper Body Bathing: with set-up;with supervision;Sitting at sink;Standing at sink;Unsupported ADL Goal: Upper Body Bathing - Progress: Goal set today Pt Will Perform Lower Body Bathing: Sitting at sink;Standing at sink;Unsupported;with adaptive equipment (min guard A) ADL Goal: Lower Body Bathing - Progress: Goal set today Pt Will Perform Upper Body Dressing: with set-up;with supervision;Unsupported;Sitting, bed ADL Goal: Upper Body Dressing - Progress: Goal set today Pt Will Perform Lower Body Dressing: with min assist;Unsupported;Sit to stand from chair;Sit to stand from bed;with adaptive equipment (maybe a dressing stick) ADL Goal: Lower Body Dressing - Progress: Goal set today Pt Will Transfer to Toilet: Ambulation;with DME;3-in-1 (min guard A with VCs for directional cues) ADL Goal: Toilet Transfer - Progress: Goal set today Pt Will Perform Toileting - Clothing Manipulation: Standing (min guard A) ADL Goal: Toileting - Clothing Manipulation - Progress: Goal set  today Miscellaneous OT Goals Miscellaneous OT Goal #1: Pt will be S for in and OOB for BADLs. OT Goal: Miscellaneous Goal #1 - Progress: Goal set today  Visit Information  Last OT Received On: 11/30/12 Assistance Needed: +2 (  equipment. ) PT/OT Co-Evaluation/Treatment: Yes    Subjective Data  Subjective: I'm going to rehab   Prior Functioning     Home Living Lives With: Alone Available Help at Discharge: Personal care attendant (5days a week/2-3 hours a day) Type of Home: House Home Access: Level entry Home Layout: One level Bathroom Shower/Tub: Health visitor: Handicapped height Home Adaptive Equipment: Straight cane;Walker - rolling;Shower chair with back;Bedside commode/3-in-1;Hand-held shower hose Additional Comments: No grab bars because "the walls are too thin" Prior Function Level of Independence: Needs assistance Needs Assistance: Meal Prep;Light Housekeeping Meal Prep: Total Light Housekeeping: Total Able to Take Stairs?: Yes Driving: No Vocation: On disability Communication Communication: HOH Dominant Hand: Right         Vision/Perception Vision - History Baseline Vision: Legally blind Patient Visual Report: No change from baseline   Cognition  Cognition Arousal/Alertness: Awake/alert Behavior During Therapy: WFL for tasks assessed/performed Overall Cognitive Status: Within Functional Limits for tasks assessed    Extremity/Trunk Assessment Right Upper Extremity Assessment RUE ROM/Strength/Tone: Deficits RUE ROM/Strength/Tone Deficits: Due to Marfan's syndrome RUE Sensation: WFL - Light Touch RUE Coordination: Deficits RUE Coordination Deficits: Marfan's syndrome Left Upper Extremity Assessment LUE ROM/Strength/Tone: Deficits LUE ROM/Strength/Tone Deficits: Marfan's syndrome LUE Sensation: WFL - Light Touch Right Lower Extremity Assessment RLE Coordination: Deficits RLE Coordination Deficits: Marfan's syndrome     Mobility  Bed Mobility Bed Mobility: Supine to Sit;Sitting - Scoot to Edge of Bed Supine to Sit: 3: Mod assist;HOB elevated (30 degrees) Sitting - Scoot to Edge of Bed: 2: Max assist Details for Bed Mobility Assistance: Pt able to bring LEs out of bed with assist mainly for trunk. Transfers Transfers: Sit to Stand;Stand to Sit Sit to Stand: With upper extremity assist;Without upper extremity assist;From bed;3: Mod assist Stand to Sit: 3: Mod assist;With upper extremity assist;With armrests;To chair/3-in-1 Details for Transfer Assistance: VCs for hand placement           End of Session OT - End of Session Equipment Utilized During Treatment: Gait belt (up at chest) Activity Tolerance: Patient tolerated treatment well Patient left: in chair;with call bell/phone within reach Nurse Communication:  (secretary: soft touch call bell and white phone)       Evette Georges [phone] 11/30/2012, 10:36 AM

## 2012-12-01 DIAGNOSIS — I059 Rheumatic mitral valve disease, unspecified: Secondary | ICD-10-CM

## 2012-12-01 DIAGNOSIS — R079 Chest pain, unspecified: Secondary | ICD-10-CM

## 2012-12-01 LAB — BASIC METABOLIC PANEL
BUN: 7 mg/dL (ref 6–23)
CO2: 23 mEq/L (ref 19–32)
Calcium: 7.7 mg/dL — ABNORMAL LOW (ref 8.4–10.5)
Chloride: 108 mEq/L (ref 96–112)
Creatinine, Ser: 0.54 mg/dL (ref 0.50–1.10)
GFR calc Af Amer: >90 mL/min (ref >90)
GFR calc non Af Amer: >90 mL/min (ref >90)
Glucose, Bld: 152 mg/dL — ABNORMAL HIGH (ref 70–99)
Potassium: 3.7 mEq/L (ref 3.5–5.1)
Sodium: 141 mEq/L (ref 135–145)

## 2012-12-01 LAB — CBC WITH DIFFERENTIAL/PLATELET
Basophils Absolute: 0 10*3/uL (ref 0.0–0.1)
Basophils Relative: 0 % (ref 0–1)
Eosinophils Absolute: 0 10*3/uL (ref 0.0–0.7)
Eosinophils Relative: 0 % (ref 0–5)
HCT: 29 % — ABNORMAL LOW (ref 36.0–46.0)
Hemoglobin: 9.3 g/dL — ABNORMAL LOW (ref 12.0–15.0)
Lymphocytes Relative: 7 % — ABNORMAL LOW (ref 12–46)
Lymphs Abs: 0.8 10*3/uL (ref 0.7–4.0)
MCH: 28 pg (ref 26.0–34.0)
MCHC: 32.1 g/dL (ref 30.0–36.0)
MCV: 87.3 fL (ref 78.0–100.0)
Monocytes Absolute: 0.6 10*3/uL (ref 0.1–1.0)
Monocytes Relative: 5 % (ref 3–12)
Neutro Abs: 9 10*3/uL — ABNORMAL HIGH (ref 1.7–7.7)
Neutrophils Relative %: 87 % — ABNORMAL HIGH (ref 43–77)
Platelets: 194 10*3/uL (ref 150–400)
RBC: 3.32 MIL/uL — ABNORMAL LOW (ref 3.87–5.11)
RDW: 15.6 % — ABNORMAL HIGH (ref 11.5–15.5)
WBC: 10.4 10*3/uL (ref 4.0–10.5)

## 2012-12-01 LAB — TROPONIN I
Troponin I: <0.3 ng/mL (ref <0.30)
Troponin I: <0.3 ng/mL (ref <0.30)
Troponin I: <0.3 ng/mL (ref <0.30)

## 2012-12-01 LAB — MAGNESIUM: Magnesium: 1.6 mg/dL (ref 1.5–2.5)

## 2012-12-01 LAB — PHOSPHORUS: Phosphorus: 1.6 mg/dL — ABNORMAL LOW (ref 2.3–4.6)

## 2012-12-01 MED ORDER — NITROGLYCERIN 0.4 MG SL SUBL
SUBLINGUAL_TABLET | SUBLINGUAL | Status: AC
  Filled 2012-12-01: qty 25

## 2012-12-01 MED ORDER — NITROGLYCERIN 0.4 MG SL SUBL
0.4000 mg | SUBLINGUAL_TABLET | SUBLINGUAL | Status: DC | PRN
  Administered 2012-12-01 (×2): 0.4 mg via SUBLINGUAL

## 2012-12-01 MED ORDER — ASPIRIN 81 MG PO CHEW
81.0000 mg | CHEWABLE_TABLET | Freq: Every day | ORAL | Status: DC
  Administered 2012-12-01 – 2012-12-05 (×5): 81 mg via ORAL
  Filled 2012-12-01 (×5): qty 1

## 2012-12-01 MED ORDER — ASPIRIN 81 MG PO CHEW
324.0000 mg | CHEWABLE_TABLET | Freq: Once | ORAL | Status: AC
  Administered 2012-12-01: 324 mg via ORAL
  Filled 2012-12-01: qty 4

## 2012-12-01 NOTE — Consult Note (Signed)
WOC ostomy consult  Stoma type/location: LUQ COlostomy Stomal assessment/size: 1 and 3/4 inches round, budded, functioning small amount of brown stool and flatus in pouch. Peristomal assessment: intact, clear.  Reabsorbing blood blister noted outside of skin barrier at 9 o'clock (.4cm round) Treatment options for stomal/peristomal skin: none indicated Output See above Ostomy pouching: 2pc. , 2 and 1/4 inch pouching system applied. [place] # [phone] (Skin barrier), [employer] [place] #[phone] (Pouch).  Pattern, sizing guide and 4 skin barriers plus 4 pouches left at bedside. Pouch changes are indicated twice weekly on Mondays and Thursdays and PRN. Education provided: None today.  Patient is still in ICU and there are no visitors at the moment.  Noted is MD referral for Case Management to explore post acute care disposition options, perhaps Rehab facility or ALF instead returning to community immediately post discharge.  I agree with this POC as pouch emptying with her hand deformity will be challenging as would replacing a closed end pouch if it is preferred that she not empty, but instead just remove and discard a closed end pouch.  In either event, wound care and ostomy care support and teaching will need to continue past predicted hospital length of stay.. I will follow along with you.  Please re-consult if needed in-between visits. Thanks, Ladona Mow, MSN, RN, [facility], CWOCN [phone])

## 2012-12-01 NOTE — Consult Note (Signed)
Reason for Consult: chest pain Referring Physician: Dr. Beola Cord I [name] is an 57 y.o. female.  HPI: 57 yo woman with PMH of Marfan's, hypertension, significant arthritis, legally blind with hearing deficits admitted a few days ago and now s/p sigmoid colectomy for perforated diverticlum 06/05 who developed brief episode of chest pain early this morning. The critical care team was notified by the primary nurse of some chest pain that lasted several minutes that did not improve with morphine and had some associated tachycardia on the monitor leading towards notifying Dr. Biagio Quint who consulted cardiology. I spoke with the primary nurse at the bedside and [name] noted a 10 second run of SVT around time of chest pain. I reviewed the monitor and agreed with SVT 150s and brief. Otherwise, no associated neck/jaw pain, Ms. Shutt characterized the pain as pressure - never had chest pain before and did notice some palpitations. [name] tells me [name]'s had some palpitations in the past but no CP and no syncope. Otherwise, asymptomatic now, CP resolved after second SL ntg.    Past Medical History  Diagnosis Date  . Legally blind     since pt was a teenager  . Hearing aid worn     pt wears bilateral hearing aids  . Marfan's syndrome affecting skin     with scolosis  . Osteoporosis   . Hypertension   . Substance abuse     recovering alcoholic x12 years   . Hernia 4/08  . 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 Chronic gout 2008  . Stroke 2/08  . Fibroid   . Status post bunionectomy 1/10    bilateral     Past Surgical History  Procedure Laterality Date  . Bunionectomy Bilateral 1/10  . Total knee arthroplasty Bilateral 6/08  . Hernia repair    . Laparotomy N/A 11/27/2012    Procedure: EXPLORATORY LAPAROTOMY SIGMOID COLECTOMY, COLOSTOMY;  Surgeon: Lodema Pilot, DO;  Location: [facility];  Service: General;  Laterality: N/A;    History reviewed. No pertinent family history. [name] does not  know if her mother or father had CAD or HTN  Social History:  reports that [name] has been smoking Cigarettes.  [name] has been smoking about 1.00 pack per day. [name] has never used smokeless tobacco. [name] reports that [name] does not drink alcohol or use illicit drugs.  Allergies: No Known Allergies  Medications:  I have reviewed the patient's current medications. Prior to Admission:  Facility-administered medications prior to admission  Medication Dose Route Frequency Provider Last Rate Last Dose  . TDaP (BOOSTRIX) injection 0.5 mL  0.5 mL Intramuscular Once Nelwyn [place]bury, MD       Prescriptions prior to admission  Medication Sig Dispense Refill  . ACTONEL 150 MG tablet TAKE 1 TABLET BY MOUTH EVERY 30 DAYS    WITH WATER ON EMPTY STOMACH, NOTHING BY MOUTH OR LIE DOWN FOR NEXT 30 MINUTES  1 tablet  11  . allopurinol (ZYLOPRIM) 100 MG tablet TAKE ONE TABLET BY MOUTH ONCE DAILY  30 tablet  11  . AMITIZA 24 MCG capsule TAKE ONE CAPSULE TWICE A DAY WITH A MEAL  60 capsule  11  . amLODipine (NORVASC) 10 MG tablet TAKE ONE (1) TABLET EACH DAY  30 tablet  11  . Calcium Carbonate-Vitamin D (CALCIUM + D PO) Take 1,200 mg by mouth daily.      . cetirizine (ZYRTEC) 10 MG tablet TAKE ONE (1) TABLET EACH DAY  30 tablet  11  . Cyanocobalamin (VITAMIN B 12 PO) Take 1 tablet by mouth daily.      . cyclobenzaprine (FLEXERIL) 10 MG tablet TAKE ONE (1) TABLET THREE (3) TIMES     A DAY IF NEEDED  270 tablet  11  . docusate sodium (COLACE) 100 MG capsule Take 100 mg by mouth 2 (two) times daily.        Marland Kitchen esomeprazole (NEXIUM) 40 MG capsule Take 1 capsule (40 mg total) by mouth 2 (two) times daily.  60 capsule  11  . Multiple Vitamins-Minerals (MULTIVITAMIN PO) Take by mouth daily.      . predniSONE (DELTASONE) 10 MG tablet Take 10 mg by mouth daily.      . traMADol (ULTRAM) 50 MG tablet TAKE 1 TO 2 TABLETS EVERY 6 HOURS AS    NEEDED FOR PAIN.                                                  GENERIC FOR ULTRAM  120 tablet   5  . albuterol (PROVENTIL HFA;VENTOLIN HFA) 108 (90 BASE) MCG/ACT inhaler Inhale 2 puffs into the lungs every 4 (four) hours as needed for wheezing or shortness of breath.  1 Inhaler  5  . HYDROcodone-acetaminophen (NORCO/VICODIN) 5-325 MG per tablet Take 1 tablet by mouth every 6 (six) hours as needed for pain.  120 tablet  5   Scheduled: . antiseptic oral rinse  15 mL Mouth Rinse q12n4p  . chlorhexidine  15 mL Mouth Rinse BID  . enoxaparin (LOVENOX) injection  40 mg Subcutaneous Q24H  . hydrocortisone sod succinate (SOLU-CORTEF) inj  50 mg Intravenous Q12H  . morphine   Intravenous Q4H  . nitroGLYCERIN      . pantoprazole (PROTONIX) IV  40 mg Intravenous Q24H  . piperacillin-tazobactam (ZOSYN)  IV  3.375 g Intravenous Q8H    Results for orders placed during the hospital encounter of 11/26/12 (from the past 48 hour(s))  CBC     Status: Abnormal   Collection Time    11/29/12  3:55 AM      Result Value Range   WBC 16.3 (*) 4.0 - 10.5 K/uL   RBC 3.26 (*) 3.87 - 5.11 MIL/uL   Hemoglobin 9.5 (*) 12.0 - 15.0 g/dL   Comment: REPEATED TO VERIFY     DELTA CHECK NOTED   HCT 28.4 (*) 36.0 - 46.0 %   MCV 87.1  78.0 - 100.0 fL   MCH 29.1  26.0 - 34.0 pg   MCHC 33.5  30.0 - 36.0 g/dL   RDW 04.5 (*) 40.9 - 81.1 %   Platelets 174  150 - 400 K/uL  BASIC METABOLIC PANEL     Status: Abnormal   Collection Time    11/29/12  3:55 AM      Result Value Range   Sodium 140  135 - 145 mEq/L   Potassium 3.4 (*) 3.5 - 5.1 mEq/L   Comment: DELTA CHECK NOTED     REPEATED TO VERIFY   Chloride 108  96 - 112 mEq/L   CO2 25  19 - 32 mEq/L   Glucose, Bld 88  70 - 99 mg/dL   BUN 14  6 - 23 mg/dL   Creatinine, Ser 9.14  0.50 - 1.10 mg/dL   Comment: DELTA CHECK NOTED  REPEATED TO VERIFY   Calcium 7.2 (*) 8.4 - 10.5 mg/dL   GFR calc non Af Amer >90  >90 mL/min   GFR calc Af Amer >90  >90 mL/min   Comment:            The eGFR has been calculated     using the CKD EPI equation.     This calculation has  not been     validated in all clinical     situations.     eGFR's persistently     <90 mL/min signify     possible Chronic Kidney Disease.  MAGNESIUM     Status: None   Collection Time    11/29/12  3:55 AM      Result Value Range   Magnesium 2.3  1.5 - 2.5 mg/dL  BASIC METABOLIC PANEL     Status: Abnormal   Collection Time    11/30/12  3:49 AM      Result Value Range   Sodium 139  135 - 145 mEq/L   Potassium 4.0  3.5 - 5.1 mEq/L   Chloride 109  96 - 112 mEq/L   CO2 24  19 - 32 mEq/L   Glucose, Bld 130 (*) 70 - 99 mg/dL   BUN 11  6 - 23 mg/dL   Creatinine, Ser 4.09  0.50 - 1.10 mg/dL   Calcium 7.5 (*) 8.4 - 10.5 mg/dL   GFR calc non Af Amer >90  >90 mL/min   GFR calc Af Amer >90  >90 mL/min   Comment:            The eGFR has been calculated     using the CKD EPI equation.     This calculation has not been     validated in all clinical     situations.     eGFR's persistently     <90 mL/min signify     possible Chronic Kidney Disease.  CBC WITH DIFFERENTIAL     Status: Abnormal   Collection Time    11/30/12  3:49 AM      Result Value Range   WBC 15.0 (*) 4.0 - 10.5 K/uL   RBC 3.23 (*) 3.87 - 5.11 MIL/uL   Hemoglobin 9.4 (*) 12.0 - 15.0 g/dL   HCT 81.1 (*) 91.4 - 78.2 %   MCV 87.6  78.0 - 100.0 fL   MCH 29.1  26.0 - 34.0 pg   MCHC 33.2  30.0 - 36.0 g/dL   RDW 95.6 (*) 21.3 - 08.6 %   Platelets 186  150 - 400 K/uL   Neutrophils Relative % 92 (*) 43 - 77 %   Neutro Abs 13.8 (*) 1.7 - 7.7 K/uL   Lymphocytes Relative 4 (*) 12 - 46 %   Lymphs Abs 0.7  0.7 - 4.0 K/uL   Monocytes Relative 3  3 - 12 %   Monocytes Absolute 0.5  0.1 - 1.0 K/uL   Eosinophils Relative 0  0 - 5 %   Eosinophils Absolute 0.1  0.0 - 0.7 K/uL   Basophils Relative 0  0 - 1 %   Basophils Absolute 0.0  0.0 - 0.1 K/uL  TROPONIN I     Status: None   Collection Time    12/01/12  1:52 AM      Result Value Range   Troponin I <0.30  <0.30 ng/mL   Comment:            Due to the  release kinetics of  cTnI,     a negative result within the first hours     of the onset of symptoms does not rule out     myocardial infarction with certainty.     If myocardial infarction is still suspected,     repeat the test at appropriate intervals.    No results found.  Review of Systems  Constitutional: Negative for fever and chills.  HENT: Positive for hearing loss.   Respiratory: Negative for shortness of breath and wheezing.   Cardiovascular: Positive for chest pain and palpitations.  Gastrointestinal: Positive for abdominal pain and constipation.  Genitourinary: Negative for frequency and hematuria.  Musculoskeletal: Positive for joint pain. Negative for myalgias.  Skin: Negative for rash.  Neurological: Negative for sensory change, speech change and focal weakness.  Endo/Heme/Allergies: Negative for polydipsia.  Psychiatric/Behavioral: Negative for suicidal ideas, hallucinations and substance abuse.   Blood pressure 154/87, pulse 63, temperature 98.7 F (37.1 C), temperature source Oral, resp. rate 14, height 5\' 8"  (1.727 m), weight 66.3 kg (146 lb 2.6 oz), last menstrual period 06/25/2006, SpO2 100.00%. Physical Exam  Nursing note and vitals reviewed. Constitutional: [name] is oriented to person, place, and time. [name] appears well-developed and well-nourished.  Pleasant woman, blind, hard of hearing but speaks well but terse answers  HENT:  Head: Normocephalic and atraumatic.  Nose: Nose normal.  Mouth/Throat: Oropharynx is clear and moist. No oropharyngeal exudate.  Neck: Normal range of motion. Neck supple. No JVD present. No tracheal deviation present. No thyromegaly present.  Cardiovascular: Normal rate, regular rhythm, normal heart sounds and intact distal pulses.   No murmur heard. Regular rhythm with significant respiratory variation/sinus arrhythmia  Respiratory: Effort normal and breath sounds normal. No respiratory distress. [name] has no wheezes.  Listening anteriorly and laterally   GI: Soft. [name] exhibits no distension. There is tenderness.  Recent surgery; healing well  Musculoskeletal: Normal range of motion. [name] exhibits no edema and no tenderness.  Neurological: [name] is alert and oriented to person, place, and time.  Skin: Skin is warm and dry. No erythema.  Psychiatric: [name] has a normal mood and affect. Her behavior is normal.    Labs reviewed; wbc 15, h/h 9.4/28.3, plt 186, na 139, K 4.0, bun/cr 11/0.61, troponin < 0.3  ECG reviewed; short PR, significant sinus arrhythmia vs. Wandering pacemaker; query if delta wave present as notable in V3-V4. Prior ECG 6/05 with wandering pacemaker Telemetry reviewed; a few PVCs; short run of SVT in the 150s  Problem List Perforated Diverticulum s/p sigmoid colectomy Chest Pain Leukocytosis Tobacco use Marfan's Syndrome Hypertension Arthritis/Rheumatoid Arthritis GERD  Assessment/Plan: 57 yo woman with multiple medical problems and recent sigmoid colectomy for perforated diverticulum now with brief episode of chest pain. Differential diagnosis for chest pain is arrhythmia, PUD, reflux, angina, unstable angina/ACS, atypical chest pain among other etiologies. ECG without acute ischemic changes and initial troponin negative. [name] is a current tobacco user with marfan's syndrome (by chart diagnosis). Will plan on continued telemetry evaluation, aspirin, echocardiogram in AM. No overt atrial fibrillation or signs/symptoms of ongoing ischemia so will defer heparin at this time. I spoke with Dr. Biagio Quint and he was ok with heparin if there was strong suspicion of ischemia. If surface echocardiogram has no WMA and normal EF then further ischemic evaluation can be deferred unless symptoms recur.  - trend troponins, continue on telemetry - aspirin 324 mg once now - echocardiogram in AM - call with any concerning changes in symptoms  Deazia Lampi 12/01/2012, 2:42 AM

## 2012-12-01 NOTE — Progress Notes (Signed)
[id number] Earlene Plater, RN, BSN, CCM:                                                                  CHART REVIEWED AND UPDATED.  Next chart review due on [id number]. NO DISCHARGE NEEDS PRESENT AT THIS TIME. CASE MANAGEMENT [phone]

## 2012-12-01 NOTE — Progress Notes (Signed)
Rehab Admissions Coordinator Note:  Patient was screened by Meryl Dare for appropriateness for an Inpatient Acute Rehab Consult. Pt's insurance will not approve CIR admission for this diagnosis.  At this time, we are recommending Skilled Nursing Facility.  Meryl Dare 12/01/2012, 5:12 PM  I can be reached at [phone].

## 2012-12-01 NOTE — Progress Notes (Signed)
Patient: [name] / Admit Date: 11/26/2012 / Date of Encounter: 12/01/2012, 12:37 PM   Subjective  In total had 1 hour of CP overnight, remains chest pain free. No SOB or palpitations.    Objective   Telemetry: NSR/SA with occ PVCs/couplets, occ PACs. Brief recurrence of SVT at 9:23am - irregular in the beginning but there do appear to be P waves.  Also had dipping in HR to 48 transiently with loss of amplitude in P wave in top lead. Physical Exam: Filed Vitals:   12/01/12 1200  BP: 164/79  Pulse: 74  Temp:   Resp: 14   General: Well developed thin AAF in no acute distress, blind and hard of hearing Head: Normocephalic, atraumatic. Nares without discharge. Neck: JVD not elevated. Lungs: Clear bilaterally to auscultation without wheezes, rales, or rhonchi. Breathing is unlabored. Heart: RRR S1 S2, occasional ectopy, without murmurs, rubs, or gallops.  Abdomen: Soft, mild tenderness, non-distended, recent surgery. Msk:  Strength and tone appear normal for age. Extremities: No clubbing or cyanosis. Long thin UE. No LE edema.  Distal pedal pulses are 2+ and equal bilaterally. Neuro: Alert and oriented X 3. Moves all extremities spontaneously. Psych:  Responds to questions appropriately with a normal affect.    Intake/Output Summary (Last 24 hours) at 12/01/12 1237 Last data filed at 12/01/12 0600  Gross per 24 hour  Intake   3000 ml  Output   1300 ml  Net   1700 ml    Inpatient Medications:  . antiseptic oral rinse  15 mL Mouth Rinse q12n4p  . aspirin  81 mg Oral Daily  . chlorhexidine  15 mL Mouth Rinse BID  . enoxaparin (LOVENOX) injection  40 mg Subcutaneous Q24H  . hydrocortisone sod succinate (SOLU-CORTEF) inj  50 mg Intravenous Q12H  . morphine   Intravenous Q4H  . nitroGLYCERIN      . pantoprazole (PROTONIX) IV  40 mg Intravenous Q24H  . piperacillin-tazobactam (ZOSYN)  IV  3.375 g Intravenous Q8H    Labs:  Recent Labs  11/29/12 0355 11/30/12 0349  12/01/12 0152  NA 140 139 141  K 3.4* 4.0 3.7  CL 108 109 108  CO2 25 24 23   GLUCOSE 88 130* 152*  BUN 14 11 7   CREATININE 0.76 0.61 0.54  CALCIUM 7.2* 7.5* 7.7*  MG 2.3  --  1.6  PHOS  --   --  1.6*   No results found for this basename: AST, ALT, ALKPHOS, BILITOT, PROT, ALBUMIN,  in the last 72 hours  Recent Labs  11/30/12 0349 12/01/12 0152  WBC 15.0* 10.4  NEUTROABS 13.8* 9.0*  HGB 9.4* 9.3*  HCT 28.3* 29.0*  MCV 87.6 87.3  PLT 186 194    Recent Labs  12/01/12 0152 12/01/12 0640  TROPONINI <0.30 <0.30   No components found with this basename: POCBNP,  No results found for this basename: HGBA1C,  in the last 72 hours   Radiology/Studies:  Ct Abdomen Pelvis W Contrast  11/27/2012   *RADIOLOGY REPORT*  Clinical Data: Abdominal and back pain.  CT ABDOMEN AND PELVIS WITH CONTRAST  Technique:  Multidetector CT imaging of the abdomen and pelvis was performed following the standard protocol during bolus administration of intravenous contrast.  Contrast: OMNIPAQUE IOHEXOL 300 MG/ML  SOLN  Comparison: CT of the abdomen and pelvis from 11/07/2005  Findings: Minimal left basilar atelectasis is noted.  A calcified granuloma is noted at the hepatic dome.  Scattered hepatic cysts are again seen, the  largest of which measures 2.0 cm in size.  The liver is otherwise unremarkable in appearance.  The spleen is mildly diminutive and grossly unremarkable.  The gallbladder is within normal limits.  There is dilatation of the pancreatic duct to 5-6 mm in diameter, mildly more prominent than on the prior study.  There is also distension of the common hepatic duct to 1.1 cm in diameter.  This is of uncertain etiology; distal obstruction cannot be entirely excluded.  The pancreas and adrenal glands are otherwise unremarkable in appearance.  A mildly heterogeneous relatively high attenuation 1.0 cm hypodensity is noted arising at the posteromedial aspect of the left kidney.  Though this could  reflect a cyst, a small mass cannot be excluded.  The kidneys are otherwise unremarkable in appearance. There is no evidence of hydronephrosis.  No renal or ureteral stones are seen.  No perinephric stranding is appreciated.  No free fluid is identified.  The small bowel is unremarkable in appearance.  The stomach is within normal limits.  No acute vascular abnormalities are seen.  There is relatively diffuse calcification along the abdominal aorta and its branches.  The abdominal aorta is tortuous in appearance.  There is significant wall thickening at the mid to distal sigmoid colon, with associated diffuse soft tissue inflammation.  A large 4.5 x 3.4 cm collection of air is noted adjacent to the mid sigmoid colon, with a small amount of air noted tracking superiorly along the corresponding mesenteric vein.  This is compatible with a focal perforation.  A single tiny focus of free intraperitoneal air is noted along the anterior right hemipelvis.  Associated trace free fluid is seen.  Diffuse underlying diverticulosis is noted along the sigmoid colon, with more mild diverticulosis noted along the descending colon.  The transverse colon is mildly redundant.  The appendix is grossly normal in caliber, though not fully assessed; there is no evidence for appendicitis.  The bladder is moderately distended and grossly unremarkable in appearance.  The uterus is within normal limits.  The ovaries are not well assessed due to the inflammatory process within the pelvis.  No suspicious adnexal masses are seen.  No inguinal lymphadenopathy is seen.  No acute osseous abnormalities are identified.  Mild degenerative change is noted at the left hip joint.  A healed right inferior pubic ramus fracture is noted.  Significant left convex lumbar scoliosis is noted, with associated chronic degenerative change.  IMPRESSION:  1.  Significant acute diverticulitis at the mid to distal sigmoid colon, with associated focal perforation.   Large 4.5 x 3.4 cm collection of air noted at the mesentery adjacent to the sigmoid colon, with a small amount of air tracking superiorly along the corresponding mesenteric vein.  Tiny focus of free intraperitoneal air noted along the anterior right hemipelvis. 2.  Associated trace free fluid seen; diffuse underlying diverticulosis noted, with more mild diverticulosis along the descending colon. 3.  Dilatation of the pancreatic duct to 5-6 mm in diameter, with dilatation of the common hepatic duct to 1.1 cm in diameter. Distal obstruction cannot be excluded; MRCP would be helpful for further evaluation, when the patient is able to hold her breath for the study. 4.  Mildly heterogeneous relatively high attenuation 1.0 cm hypodensity at the posterior medial aspect of the left kidney, new from 2007.  Though this could reflect a cyst, a small mass cannot be excluded.  This could also be assessed when performing an MRCP as described above. 5.  Scattered hepatic cysts  again noted. 6.  Relatively diffuse calcification along the abdominal aorta and its branches. 7.  Significant left convex lumbar scoliosis noted.  Critical Value/emergent results were called by telephone at the time of interpretation on 11/27/2012 at 12:42 a.m. to [facility], who verbally acknowledged these results.   Original Report Authenticated By: Tonia Ghent, M.D.     Assessment and Plan  1. Perforated diverticulitis s/p ex lap, sigmoid colectomy, colostomy 2. Marfan's syndrome 3. RA on chronic steroid therapy 4. Chest pain, ruled out for MI 5. Rhythm: SVT, freq ectopy, short PR interval  2D Echo is pending. Initial plan was that if surface echocardiogram has no WMA and normal EF then further ischemic evaluation can be deferred unless symptoms recur. BP needs better control but only liquid diet ordered for now - might consider IV hydralazine until consistently taking orals. Would avoid BB for now given asymptomatic occasional  bradycardia. Will review telemetry with MD and discuss further intervention.  Signed, Ronie Spies PA-C Seen at [phone] on unit. BP is now stable 149/82. She denies any chest pain or dyspnea. Rhythm is NSR with very short PR merging into junctional rhythm at same rate. This is asymptomatic and needs no specific anti-arrhythmic therapy. Follow lytes closely. 2D echo shows normal EF 55% with no regional wall motion abnormalities.  Moderate MR.  Troponins negative x3

## 2012-12-01 NOTE — Progress Notes (Signed)
12/01/12 1000  PT Visit Information  Last PT Received On 12/01/12  Reason Eval/Treat Not Completed Pain limiting ability to participate (pt refused due to pain)

## 2012-12-01 NOTE — Progress Notes (Signed)
Called for chest pain relieved with nitro x2.  She is feeling better now.  HR 60-70's and BP 154/87 sats 100% on RA EKG review with 2 episodes of SVT as well as several other pac's. No ischemic changes on EKG.  Will consult cardiology.

## 2012-12-01 NOTE — Clinical Social Work Note (Signed)
CSW reviewed chart and attempted to see Pt. Pt sleeping soundly and CSW will attempt to see Pt in the am. CSW reviewed chart and Pt has insurance that may not have CIR benefits. Pt will have a copay for SNF as well. CSW will meet with Pt to discuss options in the am. Pt had an aide prior to admission.   Doreen Salvage, LCSW ICU/Stepdown Clinical Social Worker [facility] Cell [phone] Hours 8am-1200pm M-F

## 2012-12-01 NOTE — Progress Notes (Signed)
General Surgery Note  LOS: 5 days  POD -  4 Days Post-Op Room - 1230  Assessment/Plan: 1.  EXPLORATORY LAPAROTOMY SIGMOID COLECTOMY, COLOSTOMY - B. [name] - 11/28/2012  For perforated diverticulitis  On Zosyn  Open wound okay - but very thinned fascia.  2.  Marfan's syndrome 3.  On chronic steroids 4.  Blind 5.  Smokes 6.  Deformity of hands 7.  DVT prophylaxis - On Lovenox 8.  Some swelling of left upper extremity from infiltration.  Subjective:  Doing okay.  No specific complaint.  Tolerating clear liquids. Objective:   Filed Vitals:   12/01/12 0600  BP: 140/75  Pulse: 53  Temp:   Resp: 19     Intake/Output from previous day:  06/08 0701 - 06/09 0700 In: 3625 [P.O.:600; I.V.:2875; IV Piggyback:150] Out: 1300 [Urine:1150; Stool:150]  Intake/Output this shift:      Physical Exam:   General: Thin, older AAF, who is hard of hearing.  But she is alert.    HEENT: Normal. Pupils equal. .   Lungs: Clear   Abdomen: Soft.     Wound: Clean, though does not show much evidence of wound healing.  Colostomy in LUQ.     Lab Results:    Recent Labs  11/30/12 0349 12/01/12 0152  WBC 15.0* 10.4  HGB 9.4* 9.3*  HCT 28.3* 29.0*  PLT 186 194    BMET   Recent Labs  11/30/12 0349 12/01/12 0152  NA 139 141  K 4.0 3.7  CL 109 108  CO2 24 23  GLUCOSE 130* 152*  BUN 11 7  CREATININE 0.61 0.54  CALCIUM 7.5* 7.7*    PT/INR  No results found for this basename: LABPROT, INR,  in the last 72 hours  ABG  No results found for this basename: PHART, PCO2, PO2, HCO3,  in the last 72 hours   Studies/Results:  No results found.   Anti-infectives:   Anti-infectives   Start     Dose/Rate Route Frequency Ordered Stop   11/28/12 2200  piperacillin-tazobactam (ZOSYN) IVPB 3.375 g     3.375 g 12.5 mL/hr over 240 Minutes Intravenous 3 times per day 11/28/12 1332     11/28/12 0000  ertapenem (INVANZ) 1 g in sodium chloride 0.9 % 50 mL IVPB  Status:  Discontinued     1 g 100  mL/hr over 30 Minutes Intravenous Every 24 hours 11/27/12 0234 11/27/12 1120   11/27/12 1200  piperacillin-tazobactam (ZOSYN) IVPB 3.375 g  Status:  Discontinued     3.375 g 12.5 mL/hr over 240 Minutes Intravenous 4 times per day 11/27/12 1120 11/28/12 1332   11/27/12 0100  ertapenem (INVANZ) 1 g in sodium chloride 0.9 % 50 mL IVPB     1 g 100 mL/hr over 30 Minutes Intravenous  Once 11/27/12 0049 11/27/12 0130      Ovidio Kin, MD, FACS Pager: [phone],   [facility] [place] [facility] Office: [phone] 12/01/2012

## 2012-12-01 NOTE — Progress Notes (Signed)
I was called by the bedside nurse, the patient complaining of retrosternal chest pain; over the past several minutes, received morphine without significant improvement, no dyspnea but a short non sustained tachycardia event noticed on the EKG monitor during the event;   Advised to obtain cardiac enzymes; EKG obtained and with non specific ST changes, no ST elevation;   Advised bedside nurse to call primary team; the patient would benefit from cardiology consult. Nitroglycerin sl ordered.

## 2012-12-01 NOTE — Progress Notes (Signed)
  Echocardiogram 2D Echocardiogram has been performed.  Almedia Cordell 12/01/2012, 9:28 AM

## 2012-12-02 LAB — LIPID PANEL
Cholesterol: 142 mg/dL (ref 0–200)
HDL: 46 mg/dL (ref >39)
LDL Cholesterol: 74 mg/dL (ref 0–99)
Total CHOL/HDL Ratio: 3.1 RATIO
Triglycerides: 112 mg/dL (ref <150)
VLDL: 22 mg/dL (ref 0–40)

## 2012-12-02 LAB — CLOSTRIDIUM DIFFICILE BY PCR: Toxigenic C. Difficile by PCR: NEGATIVE

## 2012-12-02 LAB — PREALBUMIN: Prealbumin: 13.4 mg/dL — ABNORMAL LOW (ref 17.0–34.0)

## 2012-12-02 MED ORDER — HYDRALAZINE HCL 20 MG/ML IJ SOLN
5.0000 mg | INTRAMUSCULAR | Status: DC | PRN
  Administered 2012-12-02 (×2): 5 mg via INTRAVENOUS
  Filled 2012-12-02 (×2): qty 1

## 2012-12-02 MED ORDER — AMLODIPINE BESYLATE 5 MG PO TABS
5.0000 mg | ORAL_TABLET | Freq: Every day | ORAL | Status: DC
  Administered 2012-12-02 – 2012-12-05 (×4): 5 mg via ORAL
  Filled 2012-12-02 (×4): qty 1

## 2012-12-02 MED ORDER — PANTOPRAZOLE SODIUM 40 MG PO TBEC
40.0000 mg | DELAYED_RELEASE_TABLET | Freq: Every day | ORAL | Status: DC
  Administered 2012-12-02 – 2012-12-05 (×4): 40 mg via ORAL
  Filled 2012-12-02 (×4): qty 1

## 2012-12-02 MED ORDER — HYDROCODONE-ACETAMINOPHEN 5-325 MG PO TABS
1.0000 | ORAL_TABLET | ORAL | Status: DC | PRN
  Administered 2012-12-04 (×2): 1 via ORAL
  Administered 2012-12-05: 2 via ORAL
  Filled 2012-12-02 (×2): qty 2
  Filled 2012-12-02 (×2): qty 1

## 2012-12-02 NOTE — Progress Notes (Signed)
    Subjective:  Chest pain with certain movements, otherwise doing ok. No dyspnea or palpitations.  Objective:  Vital Signs in the last 24 hours: Temp:  [98.4 F (36.9 C)-98.8 F (37.1 C)] 98.6 F (37 C) (06/10 0800) Pulse Rate:  [34-84] 52 (06/10 0600) Resp:  [14-24] 15 (06/10 0600) BP: (132-192)/(44-108) 185/99 mmHg (06/10 0655) SpO2:  [94 %-100 %] 100 % (06/10 0600) Weight:  [68.6 kg (151 lb 3.8 oz)] 68.6 kg (151 lb 3.8 oz) (06/10 0400)  Intake/Output from previous day: 06/09 0701 - 06/10 0700 In: 2925 [I.V.:2875; IV Piggyback:50] Out: 4150 [Urine:3000; Stool:1150]  Physical Exam: Pt is alert and oriented, NAD Neck: JVP - normal, carotids 2+= without bruits Lungs: CTA bilaterally CV: RRR without murmur or gallop Abd: deferred Ext: no C/C/E, distal pulses intact and equal Skin: warm/dry no rash   Lab Results:  Recent Labs  11/30/12 0349 12/01/12 0152  WBC 15.0* 10.4  HGB 9.4* 9.3*  PLT 186 194    Recent Labs  11/30/12 0349 12/01/12 0152  NA 139 141  K 4.0 3.7  CL 109 108  CO2 24 23  GLUCOSE 130* 152*  BUN 11 7  CREATININE 0.61 0.54    Recent Labs  12/01/12 0640 12/01/12 1305  TROPONINI <0.30 <0.30    Cardiac Studies: 2D Echo: Study Conclusions  - Left ventricle: The cavity size was normal. Wall thickness was normal. The estimated ejection fraction was 55%. Wall motion was normal; there were no regional wall motion abnormalities. - Mitral valve: Moderate posteriorly directed MR. - Right ventricle: The cavity size was normal. Systolic function was mildly reduced. - Pulmonary arteries: PA peak pressure: 37mm Hg (S).  Tele: Sinus rhythm, PVC's, few couplets  Assessment/Plan:  1. Perforated diverticulitis s/p ex lap, sigmoid colectomy, colostomy  2. Marfan's syndrome  3. RA on chronic steroid therapy  4. Chest pain, ruled out for MI  5. Rhythm: SVT, freq ectopy, short PR interval 6. HTN  No further SVT seen on tele. Pt is  hypertensive - recommend amlodipine 5 mg daily. No betablocker because of previous bradyarrhythmias. Chest pain is atypical and does not require further eval here in hospital. She has ruled out for MI. From my perspective, OK to transfer to surgical non-tele floor for recovery.    Tonny Bollman, M.D. 12/02/2012, 8:14 AM

## 2012-12-02 NOTE — Progress Notes (Signed)
Physical Therapy Treatment Patient Details Name: [name] MRN: [id number] DOB: [birth year] Today's Date: 12/02/2012 Time: 4098-1191 PT Time Calculation (min): 25 min  PT Assessment / Plan / Recommendation Comments on Treatment Session  Progressing well and very motivated.  Assisted pt OOB to amb in hallway using RW for direction.  Pt plans to D/C to [facility] for [facility] Rehab.      Follow Up Recommendations  SNF     Does the patient have the potential to tolerate intense rehabilitation     Barriers to Discharge        Equipment Recommendations       Recommendations for Other Services    Frequency Min 3X/week   Plan Discharge plan remains appropriate    Precautions / Restrictions Precautions Precautions: Fall Precaution Comments:  colostomy, blind, Marfans syndrome, severe RA in hands, ABD incision Restrictions Weight Bearing Restrictions: No   Pertinent Vitals/Pain C/o ABD discomfort with act    Mobility  Bed Mobility Bed Mobility: Supine to Sit Supine to Sit: 3: Mod assist;HOB elevated Details for Bed Mobility Assistance: Pt able to bring LEs out of bed today with assist for trunk.  Transfers Transfers: Sit to Stand;Stand to Sit Sit to Stand: 3: Mod assist;With upper extremity assist;From bed;From chair/3-in-1 Stand to Sit: 3: Mod assist;With upper extremity assist;With armrests;To chair/3-in-1 Details for Transfer Assistance: increased time and extra assist on back to increase forward lean Ambulation/Gait Ambulation/Gait Assistance: 3: Mod assist Ambulation Distance (Feet): 140 Feet Assistive device: Rolling walker Ambulation/Gait Assistance Details: MAX tactile cueing for walker direction due to pt blindness. Gait Pattern: Step-through pattern;Decreased stride length;Wide base of support;Trunk flexed Gait velocity: decreased    PT Goals                                                      progressing    Visit Information  Last PT Received On:  12/02/12 Assistance Needed: +2    Subjective Data  Subjective: I can't see anything (when I asked her if she could see shadows/shapes) Patient Stated Goal: to get stronger   Cognition       Balance   fair  End of Session PT - End of Session Equipment Utilized During Treatment: Gait belt;Other (comment) (shoes) Activity Tolerance: Patient tolerated treatment well;Patient limited by fatigue Patient left: in bed;with call bell/phone within reach Nurse Communication: Mobility status   Felecia Shelling  PTA [facility]  Acute  Rehab Pager      [phone]

## 2012-12-02 NOTE — Clinical Social Work Psychosocial (Signed)
Clinical Social Work Department BRIEF PSYCHOSOCIAL ASSESSMENT 12/02/2012  Patient:  [name], [name]     Account Number:  [id number]     Admit date:  11/26/2012  Clinical Social Worker:  Jodelle Red  Date/Time:  12/02/2012 11:50 AM  Referred by:  Physician  Date Referred:  12/01/2012 Referred for  SNF Placement   Other Referral:   Interview type:  Patient Other interview type:   CHART REVIEW    PSYCHOSOCIAL DATA Living Status:  ALONE Admitted from facility:   Level of care:   Primary support name:  Jacky Kindle Primary support relationship to patient:  CHILD, ADULT Degree of support available:   good from daughter  has hh aide several hours a day    CURRENT CONCERNS Current Concerns  Post-Acute Placement   Other Concerns:    SOCIAL WORK ASSESSMENT / PLAN Pt open to SNF after this admission. CSW met with her to discuss this plan. Pt reports she has been at [facility] in the past and would like to go to that SNF. Pt agrees to CSW contacting SNF and beginning the process for SNF placement at [facility].   Assessment/plan status:  Psychosocial Support/Ongoing Assessment of Needs Other assessment/ plan:   SNF placement   Information/referral to community resources:    PATIENT'S/FAMILY'S RESPONSE TO PLAN OF CARE: Pt eager for SNF at [facility]. CSW to begin SNF placement process and will follow for d/c to SNF.    Doreen Salvage, LCSW ICU/Stepdown Clinical Social Worker [facility] Cell [phone] Hours 8am-1200pm M-F

## 2012-12-02 NOTE — Progress Notes (Signed)
This patient is receiving IV Protonix. Based on criteria approved by the Pharmacy and Therapeutics Committee, this medication is being converted to the equivalent oral dose form. These criteria include:   . The patient is eating (either orally or per tube) and/or has been taking other orally administered medications for at least 24 hours.  . This patient has no evidence of active gastrointestinal bleeding or impaired GI absorption (gastrectomy, short bowel, patient on TNA or NPO).   If you have questions about this conversion, please contact the pharmacy department.  Lynann Beaver PharmD, BCPS Pager [phone] 12/02/2012 8:09 AM

## 2012-12-02 NOTE — Progress Notes (Signed)
Hypertension with SBP 180s DBP 106   Initiate Hydralazine prn for SBP>160 as per cardiology recommendation.

## 2012-12-02 NOTE — Progress Notes (Signed)
General Surgery Note  LOS: 6 days  POD -  5 Days Post-Op Room - 1230  Assessment/Plan: 1.  EXPLORATORY LAPAROTOMY SIGMOID COLECTOMY, COLOSTOMY - [name] - 11/28/2012  For perforated diverticulitis  On Zosyn  To advance to reg diet and transfer out of unit today.  Will need social services to see for discharge planning.  1a. Open wound okay - but very thinned fascia.  2.  Marfan's syndrome 3.  On chronic steroids 4.  Blind 5.  Smokes 6.  Deformity of hands 7.  DVT prophylaxis - On Lovenox 8.  Some swelling of left upper extremity from infiltration. 9.  Anemia - Hgb - 9.3 - 12/01/2012 10.  Loose stool - she has been put on isolation until C. Diff. Results return - we did not order this  Subjective:  Doing okay.  Says that she is urinating a lot. Tolerating clear liquids.  Objective:   Filed Vitals:   12/02/12 0655  BP: 185/99  Pulse:   Temp:   Resp:      Intake/Output from previous day:  06/09 0701 - 06/10 0700 In: 2925 [I.V.:2875; IV Piggyback:50] Out: 4150 [Urine:3000; Stool:1150]  Intake/Output this shift:      Physical Exam:   General: Thin, older AAF, who is hard of hearing.  But she is alert.    HEENT: Normal.  .   Lungs: Clear   Abdomen: Soft, but mildly distended.  Bowel sounds present.   Wound: Clean.  Colostomy in LUQ with out put.     Lab Results:     Recent Labs  11/30/12 0349 12/01/12 0152  WBC 15.0* 10.4  HGB 9.4* 9.3*  HCT 28.3* 29.0*  PLT 186 194    BMET    Recent Labs  11/30/12 0349 12/01/12 0152  NA 139 141  K 4.0 3.7  CL 109 108  CO2 24 23  GLUCOSE 130* 152*  BUN 11 7  CREATININE 0.61 0.54  CALCIUM 7.5* 7.7*    PT/INR  No results found for this basename: LABPROT, INR,  in the last 72 hours  ABG  No results found for this basename: PHART, PCO2, PO2, HCO3,  in the last 72 hours   Studies/Results:  No results found.   Anti-infectives:   Anti-infectives   Start     Dose/Rate Route Frequency Ordered Stop   11/28/12  2200  piperacillin-tazobactam (ZOSYN) IVPB 3.375 g     3.375 g 12.5 mL/hr over 240 Minutes Intravenous 3 times per day 11/28/12 1332     11/28/12 0000  ertapenem (INVANZ) 1 g in sodium chloride 0.9 % 50 mL IVPB  Status:  Discontinued     1 g 100 mL/hr over 30 Minutes Intravenous Every 24 hours 11/27/12 0234 11/27/12 1120   11/27/12 1200  piperacillin-tazobactam (ZOSYN) IVPB 3.375 g  Status:  Discontinued     3.375 g 12.5 mL/hr over 240 Minutes Intravenous 4 times per day 11/27/12 1120 11/28/12 1332   11/27/12 0100  ertapenem (INVANZ) 1 g in sodium chloride 0.9 % 50 mL IVPB     1 g 100 mL/hr over 30 Minutes Intravenous  Once 11/27/12 0049 11/27/12 0130      Ovidio Kin, MD, FACS Pager: [phone],   Central  Surgery Office: [phone] 12/02/2012

## 2012-12-03 MED ORDER — SIMETHICONE 80 MG PO CHEW
80.0000 mg | CHEWABLE_TABLET | Freq: Four times a day (QID) | ORAL | Status: DC | PRN
  Administered 2012-12-03: 80 mg via ORAL
  Filled 2012-12-03 (×2): qty 1

## 2012-12-03 NOTE — Progress Notes (Signed)
    Subjective:  Feels ok this morning. No complaints. No chest pain at present. No shortness of breath.  Objective:  Vital Signs in the last 24 hours: Temp:  [98.3 F (36.8 C)-98.6 F (37 C)] 98.3 F (36.8 C) (06/10 2215) Pulse Rate:  [76-80] 76 (06/10 2215) Resp:  [15-18] 18 (06/10 2215) BP: (154-185)/(86-99) 165/87 mmHg (06/10 2215) SpO2:  [100 %] 100 % (06/10 2215)  Intake/Output from previous day: 06/10 0701 - 06/11 0700 In: 2188.3 [P.O.:800; I.V.:1338.3; IV Piggyback:50] Out: 2125 [Urine:1775; Stool:350]  Physical Exam: Pt is alert and oriented, NAD HEENT: normal Neck: JVP - normal Lungs: CTA bilaterally CV: RRR without murmur or gallop Ext: no C/C/E, distal pulses intact and equal Skin: warm/dry no rash  Lab Results:  Recent Labs  12/01/12 0152  WBC 10.4  HGB 9.3*  PLT 194    Recent Labs  12/01/12 0152  NA 141  K 3.7  CL 108  CO2 23  GLUCOSE 152*  BUN 7  CREATININE 0.54    Recent Labs  12/01/12 0640 12/01/12 1305  TROPONINI <0.30 <0.30    Cardiac Studies: 2D Echo: Study Conclusions  - Left ventricle: The cavity size was normal. Wall thickness was normal. The estimated ejection fraction was 55%. Wall motion was normal; there were no regional wall motion abnormalities. - Mitral valve: Moderate posteriorly directed MR. - Right ventricle: The cavity size was normal. Systolic function was mildly reduced. - Pulmonary arteries: PA peak pressure: 37mm Hg (S).  Assessment/Plan:  1. Perforated diverticulitis s/p ex lap, sigmoid colectomy, colostomy  2. Marfan's syndrome  3. RA on chronic steroid therapy  4. Chest pain, ruled out for MI  5. Rhythm: SVT, freq ectopy, short PR interval  6. HTN  Pt stable from CV perspective. BP is elevated. Will increase amlodipine to 10 mg daily. If remains elevated may add low-dose beta blocker.   Tonny Bollman, M.D. 12/03/2012, 6:15 AM

## 2012-12-03 NOTE — Clinical Social Work Placement (Addendum)
Clinical Social Work Department CLINICAL SOCIAL WORK PLACEMENT NOTE 12/03/2012  Patient:  JAYLEEN, SCAGLIONE  Account Number:  [id number] Admit date:  11/26/2012  Clinical Social Worker:  Jodelle Red  Date/time:  12/03/2012 08:33 AM  Clinical Social Work is seeking post-discharge placement for this patient at the following level of care:   SKILLED NURSING   (*CSW will update this form in Epic as items are completed)     Patient/family provided with [facility] Department of Clinical Social Work's list of facilities offering this level of care within the geographic area requested by the patient (or if unable, by the patient's family).    Patient/family informed of their freedom to choose among providers that offer the needed level of care, that participate in Medicare, Medicaid or managed care program needed by the patient, have an available bed and are willing to accept the patient.    Patient/family informed of [facility]' ownership interest in [facility], as well as of the fact that they are under no obligation to receive care at this facility.  PASARR submitted to EDS on Not pulling up, but existing PASARR number received from EDS on   FL2 transmitted to all facilities in geographic area requested by pt/family on  12/03/2012 FL2 transmitted to all facilities within larger geographic area on   Patient informed that his/her managed care company has contracts with or will negotiate with  certain facilities, including the following:     Patient/family informed of bed offers received:   Patient chooses bed at  Physician recommends and patient chooses bed at    Patient to be transferred to  on   Patient to be transferred to facility by   The following physician request were entered in Epic:   Additional Comments: Pt wants to go to [facility] for SNF. CSW only faxed info there as instructed. Pt appears to have existing Pasarr, but [facility] MUST not  opening her info.  Doreen Salvage, LCSW ICU/Stepdown Clinical Social Worker [facility] Cell [phone] Hours 8am-1200pm M-F

## 2012-12-03 NOTE — Progress Notes (Signed)
CSW assisting with d/c planning. Pt has accepted bed offer from [facility]. CSW will assist with d/c planning to SNF when stable for d/c.  Cori Razor LCSW [phone]

## 2012-12-03 NOTE — Progress Notes (Signed)
OT Cancellation Note  Patient Details Name: SNOW PEOPLES MRN: [id number] DOB: 06/12/1956   Cancelled Treatment:    Reason Eval/Treat Not Completed: Other (comment) (per nursing, pt with issues with incision/wound. Per notes, PA wants pt to stay relatively still right now. Will try back at another time. )  [name] [phone] 12/03/2012, 11:13 AM

## 2012-12-03 NOTE — Progress Notes (Signed)
Patient ID: [name], female   DOB: [birth year], 57 y.o.   MRN: [id number] 6 Days Post-Op  Subjective: Pt feels ok, but complaint of some gas and bloating with eating.  Otherwise no c/o  Objective: Vital signs in last 24 hours: Temp:  [98.3 F (36.8 C)-98.5 F (36.9 C)] 98.4 F (36.9 C) (06/11 0635) Pulse Rate:  [72-76] 72 (06/11 0635) Resp:  [16-18] 16 (06/11 0635) BP: (155-165)/(84-89) 155/84 mmHg (06/11 0635) SpO2:  [100 %] 100 % (06/11 0635) Last BM Date: 12/02/12  Intake/Output from previous day: 06/10 0701 - 06/11 0700 In: 2308.3 [P.O.:920; I.V.:1338.3; IV Piggyback:50] Out: 2975 [Urine:2325; Stool:650] Intake/Output this shift:    PE: Abd: soft, bloating in lower abdomen.  Ostomy leak with stool contaminating her wound.  Wound partially open, partially closed.  The lower portion of the wound appears to be dehisced.  Abdominal binder was placed to help relieve some of this tension.  Stoma is pink and viable and ostomy is working.  Lab Results:   Recent Labs  12/01/12 0152  WBC 10.4  HGB 9.3*  HCT 29.0*  PLT 194   BMET  Recent Labs  12/01/12 0152  NA 141  K 3.7  CL 108  CO2 23  GLUCOSE 152*  BUN 7  CREATININE 0.54  CALCIUM 7.7*   PT/INR No results found for this basename: LABPROT, INR,  in the last 72 hours CMP     Component Value Date/Time   NA 141 12/01/2012 0152   K 3.7 12/01/2012 0152   CL 108 12/01/2012 0152   CO2 23 12/01/2012 0152   GLUCOSE 152* 12/01/2012 0152   BUN 7 12/01/2012 0152   CREATININE 0.54 12/01/2012 0152   CALCIUM 7.7* 12/01/2012 0152   PROT 6.9 11/26/2012 2330   ALBUMIN 3.4* 11/26/2012 2330   AST 19 11/26/2012 2330   ALT 14 11/26/2012 2330   ALKPHOS 63 11/26/2012 2330   BILITOT 0.6 11/26/2012 2330   GFRNONAA >90 12/01/2012 0152   GFRAA >90 12/01/2012 0152   Lipase     Component Value Date/Time   LIPASE 31 11/26/2012 2330       Studies/Results: No results found.  Anti-infectives: Anti-infectives   Start     Dose/Rate Route Frequency  Ordered Stop   11/28/12 2200  piperacillin-tazobactam (ZOSYN) IVPB 3.375 g     3.375 g 12.5 mL/hr over 240 Minutes Intravenous 3 times per day 11/28/12 1332     11/28/12 0000  ertapenem (INVANZ) 1 g in sodium chloride 0.9 % 50 mL IVPB  Status:  Discontinued     1 g 100 mL/hr over 30 Minutes Intravenous Every 24 hours 11/27/12 0234 11/27/12 1120   11/27/12 1200  piperacillin-tazobactam (ZOSYN) IVPB 3.375 g  Status:  Discontinued     3.375 g 12.5 mL/hr over 240 Minutes Intravenous 4 times per day 11/27/12 1120 11/28/12 1332   11/27/12 0100  ertapenem (INVANZ) 1 g in sodium chloride 0.9 % 50 mL IVPB     1 g 100 mL/hr over 30 Minutes Intravenous  Once 11/27/12 0049 11/27/12 0130       Assessment/Plan  1. S/p ex lap with Hartman's procedure for perf diverticulitis 2. Marfan's  3. Arthritis, on chronic steroids  She has been on steroids since 2007. 4. Wound dehiscence  [See photo] 5. Deconditioning  Plan: 1. Will have Dr. Ezzard Standing look at her wound.  Her fascia is very thin and appears to have dehisced her wound.  I'm concerned this is going  to progress given how poor her tissues are.  For now we will keep her relatively still. 2. Add simethicone for gas.  Keep on full liquids today 3. Follow very closely.   LOS: 7 days    OSBORNE,KELLY E 12/03/2012, 9:23 AM Pager: [phone]     Unclear how thin the fascia is.  For now, will continue local wound care and have her wear an abdominal.  Ovidio Kin, MD, [facility] Surgery Pager: [phone] Office phone:  [phone]

## 2012-12-03 NOTE — Consult Note (Signed)
WOC ostomy consult  Pouch changed today due to leakage; supplies and pattern were in room and RN was able to perform without difficulty.  Noted in plan for patient to transfer to SNFpost discharge.  Will deliver teaching supplies to room so that they are with her for transfer-whenever that occurs.  (Daughter already has teaching booklet, per my partner, M. Austin.) I will follow along with you, but not closely.  Please re-consult if needed in-between visits. Thanks, Ladona Mow, MSN, RN, [facility], CWOCN [phone])

## 2012-12-04 MED ORDER — CIPROFLOXACIN HCL 500 MG PO TABS
500.0000 mg | ORAL_TABLET | Freq: Two times a day (BID) | ORAL | Status: DC
  Administered 2012-12-04 – 2012-12-05 (×3): 500 mg via ORAL
  Filled 2012-12-04 (×5): qty 1

## 2012-12-04 MED ORDER — METRONIDAZOLE 500 MG PO TABS
500.0000 mg | ORAL_TABLET | Freq: Three times a day (TID) | ORAL | Status: DC
  Administered 2012-12-04 – 2012-12-05 (×4): 500 mg via ORAL
  Filled 2012-12-04 (×6): qty 1

## 2012-12-04 MED ORDER — METOPROLOL TARTRATE 25 MG PO TABS
25.0000 mg | ORAL_TABLET | Freq: Two times a day (BID) | ORAL | Status: DC
  Administered 2012-12-04 – 2012-12-05 (×3): 25 mg via ORAL
  Filled 2012-12-04 (×4): qty 1

## 2012-12-04 NOTE — Progress Notes (Signed)
Patient ID: [name], female   DOB: 1955/12/02, 57 y.o.   MRN: [id number] 7 Days Post-Op  Subjective: Patient c/o back pain.  She thinks it's related to her abdominal binding getting bunched up behind her or something as it just started yesterday after we put this on her.  Otherwise, eating some, but not a lot.  Objective: Vital signs in last 24 hours: Temp:  [98 F (36.7 C)-98.6 F (37 C)] 98 F (36.7 C) (06/12 0604) Pulse Rate:  [71-88] 71 (06/12 0604) Resp:  [16-18] 16 (06/12 0604) BP: (137-163)/(85-93) 163/93 mmHg (06/12 0604) SpO2:  [97 %-100 %] 97 % (06/12 0604) Last BM Date: 12/03/12  Intake/Output from previous day: 06/11 0701 - 06/12 0700 In: 1931.7 [P.O.:720; I.V.:1211.7] Out: 1975 [Urine:1650; Stool:325] Intake/Output this shift:    PE: Abd: soft, +BS, wound is stable and has not progressed at this time, clean and packed.  Ostomy in place with another leak, but good output.  Lab Results:  No results found for this basename: WBC, HGB, HCT, PLT,  in the last 72 hours BMET No results found for this basename: NA, K, CL, CO2, GLUCOSE, BUN, CREATININE, CALCIUM,  in the last 72 hours PT/INR No results found for this basename: LABPROT, INR,  in the last 72 hours CMP     Component Value Date/Time   NA 141 12/01/2012 0152   K 3.7 12/01/2012 0152   CL 108 12/01/2012 0152   CO2 23 12/01/2012 0152   GLUCOSE 152* 12/01/2012 0152   BUN 7 12/01/2012 0152   CREATININE 0.54 12/01/2012 0152   CALCIUM 7.7* 12/01/2012 0152   PROT 6.9 11/26/2012 2330   ALBUMIN 3.4* 11/26/2012 2330   AST 19 11/26/2012 2330   ALT 14 11/26/2012 2330   ALKPHOS 63 11/26/2012 2330   BILITOT 0.6 11/26/2012 2330   GFRNONAA >90 12/01/2012 0152   GFRAA >90 12/01/2012 0152   Lipase     Component Value Date/Time   LIPASE 31 11/26/2012 2330       Studies/Results: No results found.  Anti-infectives: Anti-infectives   Start     Dose/Rate Route Frequency Ordered Stop   11/28/12 2200  piperacillin-tazobactam (ZOSYN) IVPB  3.375 g     3.375 g 12.5 mL/hr over 240 Minutes Intravenous 3 times per day 11/28/12 1332     11/28/12 0000  ertapenem (INVANZ) 1 g in sodium chloride 0.9 % 50 mL IVPB  Status:  Discontinued     1 g 100 mL/hr over 30 Minutes Intravenous Every 24 hours 11/27/12 0234 11/27/12 1120   11/27/12 1200  piperacillin-tazobactam (ZOSYN) IVPB 3.375 g  Status:  Discontinued     3.375 g 12.5 mL/hr over 240 Minutes Intravenous 4 times per day 11/27/12 1120 11/28/12 1332   11/27/12 0100  ertapenem (INVANZ) 1 g in sodium chloride 0.9 % 50 mL IVPB     1 g 100 mL/hr over 30 Minutes Intravenous  Once 11/27/12 0049 11/27/12 0130       Assessment/Plan  1. S/p ex lap with Hartman's for perf diverticulitis 2. HTN 3, Marfan's  4. RA on chronic steroids 5. Wound dehiscence 6. Blind 7. HOH  Plan: 1. Wound is stable, still need to follow this closely.    [photo in progress note - 12/03/2012] 2. Ostomy leaking again.  WOC needs to evaluate this given this is second day in a row that it has been leaking. 3. Cont dressing changes 4. Cont regular diet 5. Mobilize again today and follow wound.  6. Dc zosyn, change to cipro/flagyl.  Not sure she needs many more days of abx therapy.  Will d/w MD 7. SL IV  LOS: 8 days   OSBORNE,KELLY E 12/04/2012, 8:27 AM Pager: [phone]  Eating well. Agree with above. Some leak around ostomy.  Ovidio Kin, MD, [facility] Surgery Pager: [phone] Office phone:  [phone]

## 2012-12-04 NOTE — Progress Notes (Signed)
Physical Therapy Treatment Patient Details Name: PANAYIOTA LARKIN MRN: [id number] DOB: 01/14/1956 Today's Date: 12/04/2012 Time: 4098-1191 PT Time Calculation (min): 22 min  PT Assessment / Plan / Recommendation Comments on Treatment Session  Progressing with mobility. Educated pt on rolling for bed mobility.     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 Repositioned back in bed end of session    Mobility  Bed Mobility Bed Mobility: Supine to Sit;Sit to Sidelying Left Supine to Sit: 4: Min assist Sit to Sidelying Left: 4: Min assist Details for Bed Mobility Assistance: Educated pt on rolling and side<>sit for bed mobility instead of supine<>sit. Practiced rolling for sit>sidelying>supine. Multimodal cues for technique,hand placement Transfers Transfers: Sit to Stand;Stand to Sit Sit to Stand: 4: Min assist;From bed;From elevated surface Stand to Sit: 4: Min assist;To bed Details for Transfer Assistance: Assist to rise, stabilize, control descent. Increased time Ambulation/Gait Ambulation/Gait Assistance: 4: Min assist Ambulation Distance (Feet): 150 Feet Assistive device: Rolling walker Ambulation/Gait Assistance Details: Assist to maneuver straight path and turns with walker.  Gait Pattern: Step-through pattern;Wide base of support;Decreased stride length    Exercises     PT Diagnosis:    PT Problem List:   PT Treatment  Interventions:     PT Goals Acute Rehab PT Goals Pt will go Supine/Side to Sit: with min assist PT Goal: Supine/Side to Sit - Progress: Progressing toward goal Pt will go Sit to Supine/Side: with min assist PT Goal: Sit to Supine/Side - Progress: Progressing toward goal Pt will go Sit to Stand: with supervision PT Goal: Sit to Stand - Progress: Progressing toward goal Pt will go Stand to Sit: with supervision PT Goal: Stand to Sit - Progress: Progressing toward goal Pt will Ambulate: 51 - 150 feet;with supervision;with least restrictive assistive device PT Goal: Ambulate - Progress: Updated due to goal met  Visit Information  Last PT Received On: 12/04/12 Assistance Needed: +1    Subjective Data  Subjective: I just got back into the bed an hour ago. I sat up a long time today Patient Stated Goal: to get stronger   Cognition  Cognition Arousal/Alertness: Awake/alert Behavior During Therapy: WFL for tasks assessed/performed Overall Cognitive Status: Within Functional Limits for tasks assessed    Balance     End of Session PT - End of Session Activity Tolerance: Patient tolerated treatment well;Patient limited by fatigue Patient left: in bed;with call bell/phone within reach   GP     Rebeca Alert, MPT Pager: [phone]

## 2012-12-04 NOTE — Progress Notes (Signed)
    Subjective:  C/o back pain this am. Thinks maybe related to binder. No dyspnea or chest pain.  Objective:  Vital Signs in the last 24 hours: Temp:  [98 F (36.7 C)-98.6 F (37 C)] 98 F (36.7 C) (06/12 0604) Pulse Rate:  [71-88] 71 (06/12 0604) Resp:  [16-18] 16 (06/12 0604) BP: (137-163)/(85-93) 163/93 mmHg (06/12 0604) SpO2:  [97 %-100 %] 97 % (06/12 0604)  Intake/Output from previous day: 06/11 0701 - 06/12 0700 In: 1931.7 [P.O.:720; I.V.:1211.7] Out: 1975 [Urine:1650; Stool:325]  Physical Exam: Pt is alert and oriented, NAD Neck: JVP - normal Lungs: CTA bilaterally CV: RRR without murmur or gallop Abd: binder in place Ext: no C/C/E, distal pulses intact and equal Skin: warm/dry no rash   Lab Results: No results found for this basename: WBC, HGB, PLT,  in the last 72 hours No results found for this basename: NA, K, CL, CO2, GLUCOSE, BUN, CREATININE,  in the last 72 hours  Recent Labs  12/01/12 1305  TROPONINI <0.30    Assessment/Plan:  1. Perforated diverticulitis s/p ex lap, sigmoid colectomy, colostomy  2. Marfan's syndrome  3. RA on chronic steroid therapy  4. Chest pain, ruled out for MI  5. HTN - BP remains elevated  Add metoprolol 25 mg BID for better BP control. Otherwise no cardiac issues. Continue amlodipine.  Tonny Bollman, M.D. 12/04/2012, 7:15 AM

## 2012-12-04 NOTE — Progress Notes (Signed)
Occupational Therapy Treatment Patient Details Name: MARLIN BRYS MRN: [id number] DOB: 06/05/1956 Today's Date: 12/04/2012 Time: 8469-6295 OT Time Calculation (min): 19 min  OT Assessment / Plan / Recommendation Comments on Treatment Session Pt states that she cannot see anything (h/o legally blind) Will plan to introduce AE on next visit.  She has used this before when she had TKAs.     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  Pt is totally blind (sign says legally blind).  Assist to guide walker straight ADL Comments: Pt has used AE in the past (by feel) when she had TKAs in 2008, but she no longer has this.  She is unable to cross legs for adls and cannot bend due to abdominal sx.  Will plan to reintroduce these items for increased independence.  Pt agreeable to getting OOB but did not want to use 3:1 commode and had already performed all adls.  She has arthritic deformities bil but is able to hold walker and reports feeding herself and performing part of UB bathing.      OT Diagnosis:    OT Problem List:   OT Treatment Interventions:     OT Goals Acute Rehab OT  Goals Time For Goal Achievement: 12/14/12 ADL Goals Pt Will Perform Grooming: with set-up;with supervision;Unsupported;Standing at sink Pt Will Perform Upper Body Bathing: with set-up;with supervision;Sitting at sink;Standing at sink;Unsupported Pt Will Perform Lower Body Bathing: Sitting at sink;Standing at sink;Unsupported;with adaptive equipment;with min assist Pt Will Perform Upper Body Dressing: with set-up;with supervision;Unsupported;Sitting, bed Pt Will Perform Lower Body Dressing: with min assist;Unsupported;Sit to stand from chair;Sit to stand from bed;with adaptive equipment ADL Goal: Lower Body Dressing - Progress: Other (comment) (only partially addressed LB dressing today) Pt Will Transfer to Toilet: Ambulation;with DME;3-in-1;with min assist ADL Goal: Toilet Transfer - Progress: Progressing toward goals Pt Will Perform Toileting - Clothing Manipulation: Standing;with supervision Miscellaneous OT Goals Miscellaneous OT Goal #1: Pt will be S for in and OOB for BADLs. OT Goal: Miscellaneous Goal #1 - Progress: Progressing toward goals  Visit Information  Last OT Received On: 12/04/12 Assistance Needed: +1 PT/OT Co-Evaluation/Treatment: Yes    Subjective Data      Prior Functioning       Cognition  Cognition Arousal/Alertness: Awake/alert Behavior During Therapy: WFL for tasks assessed/performed Overall Cognitive Status: Within Functional Limits for tasks assessed    Mobility  Bed Mobility Bed Mobility: Supine to Sit;Sit to Sidelying Left Supine to Sit: 4: Min assist Sit to Sidelying Left: 4: Min assist Details for Bed Mobility Assistance: Educated pt on rolling and side<>sit for bed mobility instead of supine<>sit. Practiced rolling for sit>sidelying>supine. Multimodal cues for technique,hand placement Transfers Sit to Stand: 4: Min assist;From bed;From elevated  surface Stand to Sit: 4: Min assist;To bed Details for Transfer Assistance: Assist to rise, stabilize,  control descent. Increased time    Exercises      Balance     End of Session OT - End of Session Activity Tolerance: Patient tolerated treatment well Patient left: in bed;with call bell/phone within reach  GO     [facility] 12/04/2012, 4:18 PM Marica Otter, OTR/L [phone] 12/04/2012

## 2012-12-05 MED ORDER — HYDROCODONE-ACETAMINOPHEN 5-325 MG PO TABS: 1.0000 | ORAL_TABLET | ORAL | Status: DC | PRN

## 2012-12-05 MED ORDER — METOPROLOL TARTRATE 25 MG PO TABS: 25.0000 mg | ORAL_TABLET | Freq: Two times a day (BID) | ORAL | Status: DC

## 2012-12-05 NOTE — Discharge Summary (Signed)
Patient ID: [name] MRN: [id number] DOB/AGE: October 07, 1955 57 y.o.  Admit date: 11/26/2012 Discharge date: 12/05/2012  Procedures: Exploratory laparotomy with sigmoid colectomy and diverting  end colostomy with biopsy of peritoneal nodule.  Consults: cardiology  Reason for Admission: [name] is a 57 y.o. F with Marfan's syndrome. 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 The pt states intermittent lower abd pain for about 2 years. She notice a severe increase in this pain today. She also noted some back pain and low grade fevers. She is having nausea but not vomiting. She is passing flatus. She is due for a colonoscopy with Dr Leone Payor. She has a known h/o diverticulosis severe on the left on previous colonoscopy 75yrs ago. She does take 10mg  of prednisone a day. She is a smoker. She is not currently on any bloody thinners.  Admission Diagnoses:  1. Focally perforated diverticulitis 2. Marfan's 3. RA on steroids 4. HTN 5. Legally blind 6. [facility]  Hospital Course: The patient was admitted and placed on IV abx therapy and bowel rest; however, when evaluated that morning she was hypotensive, tachycardic, and had peritoneal signs.  She was taken to the operating room where she underwent a sigmoid colectomy and colostomy.  He also biopsied a peritoneal nodule, which was just calcification.  The patient was transferred to the ICU postoperatively for closer monitoring.  She initially had a postoperative ileus and was kept npo.  She did have some chest pain for which cardiology evaluated her.  She had an echo that did not reveal in WMA and a normal EF.  They did adjust her BP meds, but no other cardiac interventions were needed.  She was able to start some liquids on POD#3 and her diet was then able to be advanced as tolerated.  Her would was left open due to contamination.  Her fascia, however, was very frail and thin.  NS WD dressing changes were started postop.  Her fascia  eventually pulled through her fascial sutures with dehiscence, but no evisceration at the time of discharge.  She has an abdominal binder she is supposed to wear to assist with support.  The patient also had WOC nurse assist with her colostomy; however, due to her blindness, she is not able to take care of this herself.  The patient was otherwise stable on POD# 8 for dc to SNF.  Discharge Diagnoses:  Active Problems:   Shock   Acute blood loss anemia   Hypokalemia perforated diverticulitis S/p ex lap with sigmoid colectomy/colostomy HTN Marfan's RA on steroids Legally blind HOH  Discharge Medications:   Medication List    TAKE these medications       ACTONEL 150 MG tablet  Generic drug:  risedronate  TAKE 1 TABLET BY MOUTH EVERY 30 DAYS    WITH WATER ON EMPTY STOMACH, NOTHING BY MOUTH OR LIE DOWN FOR NEXT 30 MINUTES     albuterol 108 (90 BASE) MCG/ACT inhaler  Commonly known as:  PROVENTIL HFA;VENTOLIN HFA  Inhale 2 puffs into the lungs every 4 (four) hours as needed for wheezing or shortness of breath.     allopurinol 100 MG tablet  Commonly known as:  ZYLOPRIM  TAKE ONE TABLET BY MOUTH ONCE DAILY     AMITIZA 24 MCG capsule  Generic drug:  lubiprostone  TAKE ONE CAPSULE TWICE A DAY WITH A MEAL     amLODipine 10 MG tablet  Commonly known as:  NORVASC  TAKE ONE (  1) TABLET EACH DAY     CALCIUM + D PO  Take 1,200 mg by mouth daily.     cetirizine 10 MG tablet  Commonly known as:  ZYRTEC  TAKE ONE (1) TABLET EACH DAY     cyclobenzaprine 10 MG tablet  Commonly known as:  FLEXERIL  TAKE ONE (1) TABLET THREE (3) TIMES     A DAY IF NEEDED     docusate sodium 100 MG capsule  Commonly known as:  COLACE  Take 100 mg by mouth 2 (two) times daily.     esomeprazole 40 MG capsule  Commonly known as:  NEXIUM  Take 1 capsule (40 mg total) by mouth 2 (two) times daily.     HYDROcodone-acetaminophen 5-325 MG per tablet  Commonly known as:  NORCO/VICODIN  Take 1 tablet by  mouth every 6 (six) hours as needed for pain.     HYDROcodone-acetaminophen 5-325 MG per tablet  Commonly known as:  NORCO/VICODIN  Take 1-2 tablets by mouth every 4 (four) hours as needed.     metoprolol tartrate 25 MG tablet  Commonly known as:  LOPRESSOR  Take 1 tablet (25 mg total) by mouth 2 (two) times daily.     MULTIVITAMIN PO  Take by mouth daily.     predniSONE 10 MG tablet  Commonly known as:  DELTASONE  Take 10 mg by mouth daily.     traMADol 50 MG tablet  Commonly known as:  ULTRAM  TAKE 1 TO 2 TABLETS EVERY 6 HOURS AS    NEEDED FOR PAIN.                                                  GENERIC FOR ULTRAM     VITAMIN B 12 PO  Take 1 tablet by mouth daily.        Discharge Instructions: Follow-up Information   Follow up with Lodema Pilot Fredericka Bottcher, DO On 12/18/2012. (9:45am, for a 10:00 am appointment)    Contact information:   [address] [place] [address] [phone]       Signed: Letha Cape 12/05/2012, 11:36 AM  Agree with above.  Ovidio Kin, MD, [facility] Surgery Pager: [phone] Office phone:  [phone]

## 2012-12-05 NOTE — Progress Notes (Signed)
    Subjective:  Feels a little better today. Back pain resolved. No CP or dyspnea.   Objective:  Vital Signs in the last 24 hours: Temp:  [97.7 F (36.5 C)-98.9 F (37.2 C)] 97.7 F (36.5 C) (06/13 0529) Pulse Rate:  [60-74] 71 (06/13 0529) Resp:  [18-20] 18 (06/13 0529) BP: (145-168)/(86-94) 168/87 mmHg (06/13 0529) SpO2:  [97 %-100 %] 100 % (06/13 0529)  Intake/Output from previous day: 06/12 0701 - 06/13 0700 In: 1160 [P.O.:960; I.V.:200] Out: 1725 [Urine:1600; Stool:125]  Physical Exam: Pt is alert and oriented, NAD Neck: JVP - normal Lungs: CTA bilaterally CV: RRR without murmur or gallop Ext: no C/C/E, distal pulses intact and equal Skin: warm/dry no rash   Lab Results: No results found for this basename: WBC, HGB, PLT,  in the last 72 hours No results found for this basename: NA, K, CL, CO2, GLUCOSE, BUN, CREATININE,  in the last 72 hours No results found for this basename: TROPONINI, CK, MB,  in the last 72 hours  Assessment/Plan:  Cardiac stable. BP remains mildly increased, but just introduced metoprolol yesterday. She is back on home dose of amlodipine. Would continue same meds. Chest pain has resolved and no other issues at present. Will sign off - please call if any questions - thx.  [name], M.D. 12/05/2012, 7:33 AM

## 2012-12-05 NOTE — Progress Notes (Signed)
12/05/12 1520 Before patient was moved by EMS incision line was checked. Sutures were intact. Wet to dry dressing applied.

## 2012-12-05 NOTE — Progress Notes (Signed)
Clinical Social Work Department CLINICAL SOCIAL WORK PLACEMENT NOTE 12/05/2012  Patient:  [name], [name]  Account Number:  [id number] Admit date:  11/26/2012  Clinical Social Worker:  Jodelle Red  Date/time:  12/03/2012 08:33 AM  Clinical Social Work is seeking post-discharge placement for this patient at the following level of care:   SKILLED NURSING   (*CSW will update this form in Epic as items are completed)     Patient/family provided with [facility] Department of Clinical Social Work's list of facilities offering this level of care within the geographic area requested by the patient (or if unable, by the patient's family).    Patient/family informed of their freedom to choose among providers that offer the needed level of care, that participate in Medicare, Medicaid or managed care program needed by the patient, have an available bed and are willing to accept the patient.    Patient/family informed of [facility]' ownership interest in [facility], as well as of the fact that they are under no obligation to receive care at this facility.  PAS[facility] submitted to [employer] on 12/04/2012 PASARR number received from [employer] on 12/04/2012  FL2 transmitted to all facilities in geographic area requested by pt/family on  12/03/2012 FL2 transmitted to all facilities within larger geographic area on   Patient informed that his/her managed care company has contracts with or will negotiate with  certain facilities, including the following:     Patient/family informed of bed offers received:  12/03/2012 Patient chooses bed at [facility] Physician recommends and patient chooses bed at    Patient to be transferred to [facility] on  12/05/2012 Patient to be transferred to facility by [facility]  The following physician request were entered in Epic:   Additional Comments: Pt wants to go to [facility] for SNF. CSW only faxed info there  as instructed. Pt appears to have existing [facility], but [name] MUST not opening her info.   Cori Razor LCSW [phone]

## 2012-12-05 NOTE — Progress Notes (Signed)
Physical Therapy Treatment Patient Details Name: [name] MRN: [id number] DOB: Sep 10, 1955 Today's Date: 12/05/2012 Time: 4098-1191 PT Time Calculation (min): 24 min  PT Assessment / Plan / Recommendation Comments on Treatment Session  Progressing with mobility. Practiced rolling/bed mobility. Pt states she may d/c today. 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 Pt felt it was easier and less painful. VCs safety, technique. Assist for trunk to upright and bil LEs off/onto bed.  Transfers Transfers: Sit to Stand;Stand to Sit Sit to Stand: From elevated surface;4: Min assist Stand to Sit: 4: Min assist;To bed Details for Transfer Assistance: Assist to rise, stabilize, control descent. Increased time Ambulation/Gait Ambulation/Gait Assistance: 4: Min assist Ambulation Distance (Feet): 160 Feet Assistive device: Rolling walker Ambulation/Gait Assistance Details: Assist to maneuver straight path and turns with walker. Directional cues throughout.  Slight wheezing noted.  Gait Pattern: Step-through pattern;Decreased stride length;Wide base of support    Exercises     PT Diagnosis:    PT Problem List:   PT Treatment Interventions:     PT Goals Acute Rehab PT Goals Pt will go Supine/Side to Sit: with min assist PT Goal: Supine/Side to Sit - Progress: Progressing toward goal Pt will go Sit to Supine/Side: with min assist PT Goal: Sit to Supine/Side - Progress: Progressing toward goal Pt will go Sit to Stand: with supervision PT Goal: Sit to Stand - Progress: Progressing toward goal Pt will go Stand to Sit: with supervision PT Goal: Stand to Sit - Progress: Progressing toward goal Pt will Ambulate: 51 - 150 feet;with supervision;with least restrictive assistive device PT Goal: Ambulate - Progress: Progressing toward goal  Visit Information  Last PT Received On: 12/05/12 Assistance Needed: +1    Subjective Data  Subjective: I didnt get any rest last night Patient Stated Goal: to get stronger   Cognition  Cognition Arousal/Alertness: Awake/alert Behavior During Therapy: WFL for tasks assessed/performed Overall Cognitive Status: Within Functional Limits for tasks assessed    Balance     End of Session PT - End of Session Equipment Utilized During Treatment:  (shoes, abd binder) Activity Tolerance: Patient tolerated treatment well Patient left: in bed;with call bell/phone within reach   GP     Rebeca Alert, MPT Pager: [phone]

## 2012-12-05 NOTE — Progress Notes (Signed)
12/05/12 1400 Called report to [facility] [phone].  Patient going to room #112.

## 2012-12-08 ENCOUNTER — Telehealth (INDEPENDENT_AMBULATORY_CARE_PROVIDER_SITE_OTHER): Payer: Self-pay

## 2012-12-08 NOTE — Progress Notes (Signed)
Discharge summary sent to payer through [facility]  

## 2012-12-08 NOTE — Telephone Encounter (Signed)
Patient called back to ask about whether or not she should still be taking Prednisone and Colace.  This RN spoke to [employer]der PA who stated patient should continue on Prednisone however the Colace can be changed to PRN for constipation.  Patient states understanding at this time and agreeable.

## 2012-12-08 NOTE — Telephone Encounter (Signed)
Daughter wants o know names of medication patient has been ordered. She feels her mother is on wrong medications. Patient is @ [facility] care. I advised  her  To speak to the nurse in charge at the nursing home. We can not release any information without consent from her mother. She states she will her mother to call.

## 2012-12-18 ENCOUNTER — Ambulatory Visit (INDEPENDENT_AMBULATORY_CARE_PROVIDER_SITE_OTHER): Payer: Medicare Other | Admitting: General Surgery

## 2012-12-18 VITALS — BP 128/68 | HR 74 | Temp 98.0°F | Resp 18 | Ht 68.0 in | Wt 131.0 lb

## 2012-12-18 DIAGNOSIS — Z4889 Encounter for other specified surgical aftercare: Secondary | ICD-10-CM

## 2012-12-18 DIAGNOSIS — Z5189 Encounter for other specified aftercare: Secondary | ICD-10-CM

## 2012-12-18 NOTE — Progress Notes (Signed)
Subjective:     Patient ID: [name], female   DOB: Jul 12, 1955, 57 y.o.   MRN: [id number]  HPI This patient follows up in 3 weeks status post exporter laparotomy and sigmoid colectomy for perforated diverticulitis. She had some complications postoperatively and continues to have an open wound. She remains on steroids. She is getting routine dressing changes and her main complaint is leakage from her ostomy bag. She says that the binder causes her back to leak frequently. She says that she is eating well and her ostomy is functioning otherwise fine.  Her pathology was benign  Review of Systems     Objective:   Physical Exam No acute distress and nontoxic-appearing Her abdomen is soft and nondistended her ostomy is pink and healthy her bag is not well fixed to the skin and the ostomy hole is not well size on the current bag. Her skin however looks okay today. Her midline wound is granulating nicely without any sign of active infection I removed the final few staples from her wound and applied some benzoin and Steri-Strips and we redressed the wound. It there is no evidence of evisceration    Assessment:     Status post sigmoid colectomy for perforated diverticulitis Her situation is complicated due to her heavy steroid use. Her wound is actually healing fine lymph node evidence of active infection. I recommended that she continue with current wound care and wet to dry dressings on this. If this wound stalls out, then we will set her up at the wound care clinic for possible skin grafting. I recommended that her ostomy care and proper sizing of her ostomy appliance if the binder is causing him much of a problem, then I would rather have her without the binder and better projection of her ostomy skin. She seems to be eating well but her steroids her major problem and at this time I would not recommend any colostomy reversal until she can come off her steroids and her other medical problems improve.   I think it would be okay for the patient to shower.     Plan:     Continue with current wound care and improved ostomy care. I will see her back in about one month for repeat wound evaluation.

## 2012-12-31 ENCOUNTER — Encounter (HOSPITAL_COMMUNITY): Payer: Self-pay | Admitting: Emergency Medicine

## 2012-12-31 ENCOUNTER — Emergency Department (HOSPITAL_COMMUNITY): Payer: Medicare Other

## 2012-12-31 ENCOUNTER — Inpatient Hospital Stay (HOSPITAL_COMMUNITY)
Admission: EM | Admit: 2012-12-31 | Discharge: 2013-01-13 | DRG: 330 | Disposition: A | Discharge status: Skilled Nursing Facility | Payer: Medicare Other | Attending: Surgery | Admitting: Surgery

## 2012-12-31 DIAGNOSIS — K5732 Diverticulitis of large intestine without perforation or abscess without bleeding: Secondary | ICD-10-CM | POA: Diagnosis present

## 2012-12-31 DIAGNOSIS — Z8673 Personal history of transient ischemic attack (TIA), and cerebral infarction without residual deficits: Secondary | ICD-10-CM

## 2012-12-31 DIAGNOSIS — Z96659 Presence of unspecified artificial knee joint: Secondary | ICD-10-CM

## 2012-12-31 DIAGNOSIS — Z933 Colostomy status: Secondary | ICD-10-CM

## 2012-12-31 DIAGNOSIS — K565 Intestinal adhesions [bands], unspecified as to partial versus complete obstruction: Principal | ICD-10-CM | POA: Diagnosis present

## 2012-12-31 DIAGNOSIS — M109 Gout, unspecified: Secondary | ICD-10-CM | POA: Diagnosis present

## 2012-12-31 DIAGNOSIS — I10 Essential (primary) hypertension: Secondary | ICD-10-CM | POA: Diagnosis present

## 2012-12-31 DIAGNOSIS — H548 Legal blindness, as defined in USA: Secondary | ICD-10-CM | POA: Diagnosis present

## 2012-12-31 DIAGNOSIS — K5720 Diverticulitis of large intestine with perforation and abscess without bleeding: Secondary | ICD-10-CM

## 2012-12-31 DIAGNOSIS — M069 Rheumatoid arthritis, unspecified: Secondary | ICD-10-CM | POA: Diagnosis present

## 2012-12-31 DIAGNOSIS — E46 Unspecified protein-calorie malnutrition: Secondary | ICD-10-CM | POA: Diagnosis present

## 2012-12-31 DIAGNOSIS — K929 Disease of digestive system, unspecified: Secondary | ICD-10-CM | POA: Diagnosis not present

## 2012-12-31 DIAGNOSIS — K219 Gastro-esophageal reflux disease without esophagitis: Secondary | ICD-10-CM | POA: Diagnosis present

## 2012-12-31 DIAGNOSIS — K560 Paralytic ileus: Secondary | ICD-10-CM | POA: Diagnosis not present

## 2012-12-31 DIAGNOSIS — K56609 Unspecified intestinal obstruction, unspecified as to partial versus complete obstruction: Secondary | ICD-10-CM

## 2012-12-31 DIAGNOSIS — Q8740 Marfan's syndrome, unspecified: Secondary | ICD-10-CM

## 2012-12-31 DIAGNOSIS — F17200 Nicotine dependence, unspecified, uncomplicated: Secondary | ICD-10-CM | POA: Diagnosis present

## 2012-12-31 DIAGNOSIS — Z79899 Other long term (current) drug therapy: Secondary | ICD-10-CM

## 2012-12-31 DIAGNOSIS — IMO0002 Reserved for concepts with insufficient information to code with codable children: Secondary | ICD-10-CM

## 2012-12-31 DIAGNOSIS — Z9049 Acquired absence of other specified parts of digestive tract: Secondary | ICD-10-CM

## 2012-12-31 DIAGNOSIS — Y838 Other surgical procedures as the cause of abnormal reaction of the patient, or of later complication, without mention of misadventure at the time of the procedure: Secondary | ICD-10-CM | POA: Diagnosis not present

## 2012-12-31 DIAGNOSIS — E785 Hyperlipidemia, unspecified: Secondary | ICD-10-CM | POA: Diagnosis present

## 2012-12-31 LAB — CBC
HCT: 37.1 % (ref 36.0–46.0)
Hemoglobin: 12.1 g/dL (ref 12.0–15.0)
MCH: 28.9 pg (ref 26.0–34.0)
MCHC: 32.6 g/dL (ref 30.0–36.0)
MCV: 88.5 fL (ref 78.0–100.0)
Platelets: 305 10*3/uL (ref 150–400)
RBC: 4.19 MIL/uL (ref 3.87–5.11)
RDW: 15.8 % — ABNORMAL HIGH (ref 11.5–15.5)
WBC: 10.9 10*3/uL — ABNORMAL HIGH (ref 4.0–10.5)

## 2012-12-31 LAB — BASIC METABOLIC PANEL
BUN: 12 mg/dL (ref 6–23)
CO2: 24 mEq/L (ref 19–32)
Calcium: 9.7 mg/dL (ref 8.4–10.5)
Chloride: 102 mEq/L (ref 96–112)
Creatinine, Ser: 0.78 mg/dL (ref 0.50–1.10)
GFR calc Af Amer: >90 mL/min (ref >90)
GFR calc non Af Amer: >90 mL/min (ref >90)
Glucose, Bld: 101 mg/dL — ABNORMAL HIGH (ref 70–99)
Potassium: 3.4 mEq/L — ABNORMAL LOW (ref 3.5–5.1)
Sodium: 141 mEq/L (ref 135–145)

## 2012-12-31 MED ORDER — FENTANYL CITRATE 0.05 MG/ML IJ SOLN
50.0000 ug | Freq: Once | INTRAMUSCULAR | Status: AC
  Administered 2012-12-31: 50 ug via INTRAVENOUS
  Filled 2012-12-31: qty 2

## 2012-12-31 MED ORDER — SODIUM CHLORIDE 0.9 % IV SOLN
Freq: Once | INTRAVENOUS | Status: AC
  Administered 2013-01-01: 03:00:00 via INTRAVENOUS

## 2012-12-31 MED ORDER — IOHEXOL 300 MG/ML  SOLN
100.0000 mL | Freq: Once | INTRAMUSCULAR | Status: AC | PRN
  Administered 2012-12-31: 80 mL via INTRAVENOUS

## 2012-12-31 MED ORDER — ONDANSETRON HCL 4 MG/2ML IJ SOLN
4.0000 mg | Freq: Once | INTRAMUSCULAR | Status: AC
  Administered 2012-12-31: 4 mg via INTRAVENOUS
  Filled 2012-12-31: qty 2

## 2012-12-31 MED ORDER — IOHEXOL 300 MG/ML  SOLN
50.0000 mL | Freq: Once | INTRAMUSCULAR | Status: AC | PRN
  Administered 2012-12-31: 50 mL via ORAL

## 2012-12-31 MED ORDER — SODIUM CHLORIDE 0.9 % IV SOLN
Freq: Once | INTRAVENOUS | Status: AC
  Administered 2012-12-31: 22:00:00 via INTRAVENOUS

## 2012-12-31 NOTE — ED Provider Notes (Signed)
I saw and evaluated the patient, reviewed the resident's note and I agree with the findings and plan.   .Face to face Exam:  General:  Awake HEENT:  Atraumatic Resp:  Normal effort Abd:  Nondistended Neuro:No focal weakness    Nelia Shi, MD 12/31/12 2355

## 2012-12-31 NOTE — ED Notes (Signed)
Pt had a bowel resection with colostomy placement about 3 to 4 weeks ago  Pt states she is having abd pain and nausea  Denies vomiting  Family states they changed the bag yesterday and it has not had anything in it since

## 2012-12-31 NOTE — ED Provider Notes (Signed)
History    CSN: [id number] Arrival date & time 12/31/12  2101  First MD Initiated Contact with Patient 12/31/12 2129     Chief Complaint  Patient presents with  . Abdominal Pain   (Consider location/radiation/quality/duration/timing/severity/associated sxs/prior Treatment) HPI Comments: Pt is a 57yo female with abdominal pain and nausea, progressively getting worse for about 6 hours. Pt is s/p sigmoid colectomy with colostomy formation for perforated diverticulitis about 1 month ago, with subsequent poor/slow wound healing. Pt reports that she has had no ostomy output for about 36 hours. Pain comes and goes, currently a 7/10 with occasional few-second "shocks" of 10/10 pain. Pt states she feels like things are moving in her abdomen and "coming up to her ostomy" but not coming out. She is also nauseated but has had no vomiting. She has had poor appetite since coming home from the hospital, but has been eating. She has no fevers, other pain, SOB, limb swelling, cough.   Pt is legally blind with a hx of CVA, Marfan Syndrome, and RA on steroids, which has hindered her wound healing. On chart review, pt has had no evidence for frank wound infection and her ostomy has not appeared to have prior dysfunction.  Patient is a 57 y.o. female presenting with abdominal pain. The history is provided by the patient and a relative (daughter). No language interpreter was used.  Abdominal Pain Associated symptoms include abdominal pain and nausea. Pertinent negatives include no chest pain, congestion, coughing, numbness, sore throat, vomiting or weakness.   Past Medical History  Diagnosis Date  . Legally blind     since pt was a teenager  . Hearing aid worn     pt wears bilateral hearing aids  . Marfan's syndrome affecting skin     with scolosis  . Osteoporosis   . Hypertension   . Substance abuse     recovering alcoholic x12 years   . Hernia 4/08  . 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 Chronic gout 2008  . Stroke 2/08  . Fibroid   . Status post bunionectomy 1/10    bilateral    Past Surgical History  Procedure Laterality Date  . Bunionectomy Bilateral 1/10  . Total knee arthroplasty Bilateral 6/08  . Hernia repair    . Laparotomy N/A 11/27/2012    Procedure: EXPLORATORY LAPAROTOMY SIGMOID COLECTOMY, COLOSTOMY;  Surgeon: Lodema Pilot, DO;  Location: [facility];  Service: General;  Laterality: N/A;  . Colostomy     History reviewed. No pertinent family history. History  Substance Use Topics  . Smoking status: Current Every Day Smoker -- 1.00 packs/day    Types: Cigarettes  . Smokeless tobacco: Never Used  . Alcohol Use: No     Comment: recovering alcoholic   OB History   Grav Para Term Preterm Abortions TAB SAB Ect Mult Living   1 1 1       1      Review of Systems  HENT: Negative for congestion and sore throat.   Respiratory: Negative for cough and shortness of breath.   Cardiovascular: Negative for chest pain and leg swelling.  Gastrointestinal: Positive for nausea and abdominal pain. Negative for vomiting and abdominal distention.  Genitourinary: Negative for dysuria, urgency, frequency and hematuria.  Skin: Positive for wound.       Healing abdominal wound  Neurological: Negative for weakness and numbness.  Psychiatric/Behavioral: Negative for hallucinations and confusion.    Allergies  Review of patient's  allergies indicates no known allergies.  Home Medications   Current Outpatient Rx  Name  Route  Sig  Dispense  Refill  . ACTONEL 150 MG tablet      TAKE 1 TABLET BY MOUTH EVERY 30 DAYS    WITH WATER ON EMPTY STOMACH, NOTHING BY MOUTH OR LIE DOWN FOR NEXT 30 MINUTES   1 tablet   11   . albuterol (PROVENTIL HFA;VENTOLIN HFA) 108 (90 BASE) MCG/ACT inhaler   Inhalation   Inhale 2 puffs into the lungs every 4 (four) hours as needed for wheezing or shortness of breath.   1 Inhaler   5   . allopurinol (ZYLOPRIM) 100 MG tablet      TAKE ONE  TABLET BY MOUTH ONCE DAILY   30 tablet   11   . amLODipine (NORVASC) 10 MG tablet      TAKE ONE (1) TABLET EACH DAY   30 tablet   11   . Calcium Carbonate-Vitamin D (CALCIUM + D PO)   Oral   Take 1,200 mg by mouth daily.         . cetirizine (ZYRTEC) 10 MG tablet      TAKE ONE (1) TABLET EACH DAY   30 tablet   11     PLEASE RESPOND   . Cyanocobalamin (VITAMIN B 12 PO)   Oral   Take 1 tablet by mouth daily.         . cyclobenzaprine (FLEXERIL) 10 MG tablet      TAKE ONE (1) TABLET THREE (3) TIMES     A DAY IF NEEDED   270 tablet   11   . docusate sodium (COLACE) 100 MG capsule   Oral   Take 100 mg by mouth 2 (two) times daily.           Marland Kitchen esomeprazole (NEXIUM) 40 MG capsule   Oral   Take 1 capsule (40 mg total) by mouth 2 (two) times daily.   60 capsule   11     PLEASE RESPOND   . HYDROcodone-acetaminophen (NORCO/VICODIN) 5-325 MG per tablet   Oral   Take 1 tablet by mouth every 6 (six) hours as needed for pain.   120 tablet   5   . metoprolol tartrate (LOPRESSOR) 25 MG tablet   Oral   Take 1 tablet (25 mg total) by mouth 2 (two) times daily.         . Multiple Vitamins-Minerals (MULTIVITAMIN PO)   Oral   Take by mouth daily.         . predniSONE (DELTASONE) 10 MG tablet   Oral   Take 10 mg by mouth daily.         . traMADol (ULTRAM) 50 MG tablet      TAKE 1 TO 2 TABLETS EVERY 6 HOURS AS    NEEDED FOR PAIN.                                                  GENERIC FOR ULTRAM   120 tablet   5    BP 154/95  Pulse 104  Temp(Src) 98.8 F (37.1 C) (Oral)  Resp 22  SpO2 99%  LMP 06/25/2006 Physical Exam  Nursing note and vitals reviewed. Constitutional: She is oriented to person, place, and time. No distress.  HENT:  Head: Normocephalic and  atraumatic.  Eyes: Left eye exhibits no discharge. Left conjunctiva is not injected.  Right eye with chronic changes, darkened sclera/conjunctiva, s/p injury "as a teenager," at baseline  appearance per family member  Neck: Normal range of motion.  Cardiovascular: Normal rate, regular rhythm and normal heart sounds.   Pulmonary/Chest: Effort normal and breath sounds normal. No respiratory distress. She has no wheezes.  Abdominal: Soft. She exhibits no distension. There is tenderness in the right lower quadrant and left lower quadrant. There is no rebound.    Musculoskeletal: She exhibits no edema and no tenderness.  Chronic deformity of bilateral hands with hx of Marfan Syndrome and RA  Neurological: She is alert and oriented to person, place, and time.  Skin: Skin is warm and dry. She is not diaphoretic.  Psychiatric: She has a normal mood and affect. Her behavior is normal.   ED Course  Procedures (including critical care time)  2145: Exam as above, no ostomy output in >24 hours. Mild tachycardia. Diffuse abdominal tenderness and slowly healing midline wound. CBC and BMP pending.  2215: Spoke with Dr. Maisie Fus of [facility] who recommended CT abdomen with contrast to evaluate for obstruction, then to re-contact her if a surgical issue is seen. Will order and reassess.  2225: CBC and BMP show only mild increased WBC with K 3.4. CT ordered and pending. Discussed plans with pt and daughter, in agreement with no questions at this time. Zofran and Fentanyl IV ordered; considered Toradol in place of narcotic in case of ileus but would prefer to avoid NSAID given contrast load for CT.  2320: CT abd shows likely obstruction with stomach and proximal small bowel distension and transition zone in the mid-abdomen with no residual diverticulitis, free fluid, or free air. Dr. Maisie Fus contacted who will come to evaluate patient. She requests NG tube to be placed here. Pt agreeable but still with some pain. Will give another dose of Fentanyl and order for NPO with IVF, NS @ 75 mL/h.  Labs Reviewed  CBC - Abnormal; Notable for the following:    WBC 10.9 (*)    RDW 15.8 (*)    All other components  within normal limits  BASIC METABOLIC PANEL - Abnormal; Notable for the following:    Potassium 3.4 (*)    Glucose, Bld 101 (*)    All other components within normal limits   Ct Abdomen Pelvis W Contrast  12/31/2012   *RADIOLOGY REPORT*  Clinical Data: Abdominal pain.  Recent colectomy with abdominal pain and no output.  CT ABDOMEN AND PELVIS WITH CONTRAST  Technique:  Multidetector CT imaging of the abdomen and pelvis was performed following the standard protocol during bolus administration of intravenous contrast.  Contrast: 80mL OMNIPAQUE IOHEXOL 300 MG/ML  SOLN, 50mL OMNIPAQUE IOHEXOL 300 MG/ML  SOLN  Comparison: 11/27/2012  Findings: Lung bases are clear.  Coronary artery calcification.  Circumscribed cyst in the medial segment left lobe of liver measuring 18 mm diameter and stable since the previous study.  The gallbladder is contracted.  Pancreas, spleen, adrenal glands, inferior vena cava, and retroperitoneal lymph nodes are unremarkable.  Sub centimeter parenchymal cysts in the kidneys.  No solid mass or hydronephrosis is appreciated.  The stomach and small bowel are distended with decompression of the ileum.  Transition zone appears to be in the left mid abdomen, likely due to the lesion.  Scattered stool filled colon with what appears to be a diverting transverse colostomy in the left lower quadrant.  No free air  or free fluid in the abdomen.  Calcification of the aorta without aneurysm.  Pelvis:  The bladder wall is not thickened.  2.7 cm enhancing mass in the uterus consistent with uterine fibroid.  No abnormal adnexal lesions.  No free or loculated pelvic fluid collections.  Appendix is normal.  Surgical clips in the rectosigmoid junction.  Prominent lumbar scoliosis with degenerative changes throughout the lumbar spine and hips.  IMPRESSION: Distension of the stomach and proximal small bowel with transition zone in the left mid abdomen, likely due to adhesions.  No residual diverticulitis.   Postoperative changes with a diverting left lower quadrant colostomy.   Original Report Authenticated By: Burman Nieves, M.D.   1. Small bowel obstruction     MDM  [phone] female with abdominal pain/nausea, recent sigmoid colectomy with colostomy placement with likely SBO on CT abdomen. Dr. Maisie Fus of [facility] aware and will evaluate pt for likely admission, surgery service vs hospitalist. Care checked out to Dr. Rulon Abide.  The above was discussed in its entirety with attending ED physician Dr. Radford Pax.   Bobbye Morton, MD  PGY-2, [facility] Medicine  Bobbye Morton, MD 12/31/12 [phone]

## 2013-01-01 ENCOUNTER — Encounter (HOSPITAL_COMMUNITY): Payer: Self-pay | Admitting: *Deleted

## 2013-01-01 DIAGNOSIS — E876 Hypokalemia: Secondary | ICD-10-CM

## 2013-01-01 LAB — BASIC METABOLIC PANEL
BUN: 9 mg/dL (ref 6–23)
CO2: 26 mEq/L (ref 19–32)
Calcium: 8.9 mg/dL (ref 8.4–10.5)
Chloride: 105 mEq/L (ref 96–112)
Creatinine, Ser: 0.69 mg/dL (ref 0.50–1.10)
GFR calc Af Amer: >90 mL/min (ref >90)
GFR calc non Af Amer: >90 mL/min (ref >90)
Glucose, Bld: 147 mg/dL — ABNORMAL HIGH (ref 70–99)
Potassium: 3 mEq/L — ABNORMAL LOW (ref 3.5–5.1)
Sodium: 141 mEq/L (ref 135–145)

## 2013-01-01 LAB — MAGNESIUM: Magnesium: 1.5 mg/dL (ref 1.5–2.5)

## 2013-01-01 MED ORDER — ALBUTEROL SULFATE HFA 108 (90 BASE) MCG/ACT IN AERS
2.0000 | INHALATION_SPRAY | RESPIRATORY_TRACT | Status: DC | PRN
  Filled 2013-01-01: qty 6.7

## 2013-01-01 MED ORDER — LIDOCAINE HCL 2 % EX GEL: Freq: Once | CUTANEOUS | Status: AC

## 2013-01-01 MED ORDER — ONDANSETRON HCL 4 MG/2ML IJ SOLN
4.0000 mg | Freq: Four times a day (QID) | INTRAMUSCULAR | Status: DC | PRN
  Administered 2013-01-01 – 2013-01-06 (×9): 4 mg via INTRAVENOUS
  Filled 2013-01-01 (×9): qty 2

## 2013-01-01 MED ORDER — METOPROLOL TARTRATE 25 MG PO TABS
25.0000 mg | ORAL_TABLET | Freq: Two times a day (BID) | ORAL | Status: DC
  Administered 2013-01-01 – 2013-01-04 (×8): 25 mg via ORAL
  Filled 2013-01-01 (×10): qty 1

## 2013-01-01 MED ORDER — PREDNISONE 10 MG PO TABS
10.0000 mg | ORAL_TABLET | Freq: Every day | ORAL | Status: DC
  Administered 2013-01-01 – 2013-01-06 (×6): 10 mg via ORAL
  Filled 2013-01-01 (×7): qty 1

## 2013-01-01 MED ORDER — AMLODIPINE BESYLATE 10 MG PO TABS
10.0000 mg | ORAL_TABLET | Freq: Every day | ORAL | Status: DC
  Administered 2013-01-01 – 2013-01-06 (×6): 10 mg via ORAL
  Filled 2013-01-01 (×7): qty 1

## 2013-01-01 MED ORDER — PANTOPRAZOLE SODIUM 40 MG IV SOLR
40.0000 mg | Freq: Two times a day (BID) | INTRAVENOUS | Status: DC
  Administered 2013-01-01 – 2013-01-12 (×23): 40 mg via INTRAVENOUS
  Filled 2013-01-01 (×26): qty 40

## 2013-01-01 MED ORDER — LORATADINE 10 MG PO TABS
10.0000 mg | ORAL_TABLET | Freq: Every day | ORAL | Status: DC
  Administered 2013-01-01 – 2013-01-13 (×13): 10 mg via ORAL
  Filled 2013-01-01 (×14): qty 1

## 2013-01-01 MED ORDER — SODIUM CHLORIDE 0.9 % IV SOLN
INTRAVENOUS | Status: DC
  Administered 2013-01-01: 06:00:00 via INTRAVENOUS

## 2013-01-01 MED ORDER — ALLOPURINOL 100 MG PO TABS
50.0000 mg | ORAL_TABLET | Freq: Every day | ORAL | Status: DC
  Administered 2013-01-01 – 2013-01-06 (×6): 50 mg via ORAL
  Filled 2013-01-01 (×7): qty 0.5

## 2013-01-01 MED ORDER — HYDROMORPHONE HCL PF 1 MG/ML IJ SOLN
0.5000 mg | INTRAMUSCULAR | Status: DC | PRN
  Administered 2013-01-01 – 2013-01-03 (×8): 0.5 mg via INTRAVENOUS
  Administered 2013-01-04: 04:00:00 via INTRAVENOUS
  Administered 2013-01-04 – 2013-01-07 (×9): 0.5 mg via INTRAVENOUS
  Filled 2013-01-01 (×19): qty 1

## 2013-01-01 MED ORDER — MAGNESIUM SULFATE 40 MG/ML IJ SOLN
2.0000 g | Freq: Once | INTRAMUSCULAR | Status: AC
  Administered 2013-01-01: 2 g via INTRAVENOUS
  Filled 2013-01-01: qty 50

## 2013-01-01 MED ORDER — LIDOCAINE HCL 2 % EX GEL
CUTANEOUS | Status: AC
  Administered 2013-01-01: 10
  Filled 2013-01-01: qty 10

## 2013-01-01 MED ORDER — PANTOPRAZOLE SODIUM 40 MG PO TBEC: 80.0000 mg | DELAYED_RELEASE_TABLET | Freq: Every day | ORAL | Status: DC

## 2013-01-01 MED ORDER — DOCUSATE SODIUM 100 MG PO CAPS
100.0000 mg | ORAL_CAPSULE | Freq: Two times a day (BID) | ORAL | Status: DC
  Administered 2013-01-01 – 2013-01-04 (×8): 100 mg via ORAL
  Filled 2013-01-01 (×6): qty 1

## 2013-01-01 MED ORDER — POTASSIUM CHLORIDE IN NACL 40-0.9 MEQ/L-% IV SOLN
INTRAVENOUS | Status: DC
  Administered 2013-01-01 – 2013-01-04 (×7): via INTRAVENOUS
  Administered 2013-01-04: 125 mL/h via INTRAVENOUS
  Administered 2013-01-04 – 2013-01-05 (×2): via INTRAVENOUS
  Filled 2013-01-01 (×14): qty 1000

## 2013-01-01 MED ORDER — CYCLOBENZAPRINE HCL 10 MG PO TABS
5.0000 mg | ORAL_TABLET | Freq: Three times a day (TID) | ORAL | Status: DC | PRN
  Administered 2013-01-03 – 2013-01-04 (×2): 5 mg via ORAL
  Filled 2013-01-01 (×4): qty 1

## 2013-01-01 MED ORDER — HEPARIN SODIUM (PORCINE) 5000 UNIT/ML IJ SOLN
5000.0000 [IU] | Freq: Three times a day (TID) | INTRAMUSCULAR | Status: DC
  Administered 2013-01-01 – 2013-01-06 (×16): 5000 [IU] via SUBCUTANEOUS
  Filled 2013-01-01 (×21): qty 1

## 2013-01-01 NOTE — Progress Notes (Signed)
Patient interviewed and examined, agree with PA note above. CT scan personally reviewed. She is feeling somewhat better this afternoon. Abdomen is soft and without appreciable tenderness. No stool or gas in the colostomy yet. Continue current management. Mariella Saa MD, FACS  01/01/2013 4:31 PM

## 2013-01-01 NOTE — Progress Notes (Signed)
Subjective: Her NG suction regulator was not working properly, i switched it to regular suction and got 500 ml right away and she started vomiting about 100+ ml.  Objective: Vital signs in last 24 hours: Temp:  [97.8 F (36.6 C)-98.8 F (37.1 C)] 98 F (36.7 C) (07/10 0954) Pulse Rate:  [97-104] 98 (07/10 0954) Resp:  [16-22] 16 (07/10 0954) BP: (129-185)/(78-95) 146/80 mmHg (07/10 0954) SpO2:  [94 %-99 %] 95 % (07/10 0954) Weight:  [61.2 kg (134 lb 14.7 oz)] 61.2 kg (134 lb 14.7 oz) (07/10 0431)  1150 from NG recorded since last PM Afebrile, VSS, a little tachycardic K+ 3.0, will check magnesium too . sodium chloride 125 mL/hr at 01/01/13 1610   Intake/Output from previous day: 07/09 0701 - 07/10 0700 In: 1250 [I.V.:1250] Out: 2050 [Emesis/NG output:1150] Intake/Output this shift: Total I/O In: -  Out: 150 [Emesis/NG output:150]  General appearance: alert, cooperative, mild distress and she started vomiting while I was getting suction to work. GI: distended, didn't feel good, still draining allot from the NG 600 ml while I was in the room.  Lab Results:   Recent Labs  12/31/12 2142  WBC 10.9*  HGB 12.1  HCT 37.1  PLT 305    BMET  Recent Labs  12/31/12 2142 01/01/13 0525  NA 141 141  K 3.4* 3.0*  CL 102 105  CO2 24 26  GLUCOSE 101* 147*  BUN 12 9  CREATININE 0.78 0.69  CALCIUM 9.7 8.9   PT/INR No results found for this basename: LABPROT, INR,  in the last 72 hours  No results found for this basename: AST, ALT, ALKPHOS, BILITOT, PROT, ALBUMIN,  in the last 168 hours   Lipase     Component Value Date/Time   LIPASE 31 11/26/2012 2330     Studies/Results: Ct Abdomen Pelvis W Contrast  12/31/2012   *RADIOLOGY REPORT*  Clinical Data: Abdominal pain.  Recent colectomy with abdominal pain and no output.  CT ABDOMEN AND PELVIS WITH CONTRAST  Technique:  Multidetector CT imaging of the abdomen and pelvis was performed following the standard protocol during  bolus administration of intravenous contrast.  Contrast: 80mL OMNIPAQUE IOHEXOL 300 MG/ML  SOLN, 50mL OMNIPAQUE IOHEXOL 300 MG/ML  SOLN  Comparison: 11/27/2012  Findings: Lung bases are clear.  Coronary artery calcification.  Circumscribed cyst in the medial segment left lobe of liver measuring 18 mm diameter and stable since the previous study.  The gallbladder is contracted.  Pancreas, spleen, adrenal glands, inferior vena cava, and retroperitoneal lymph nodes are unremarkable.  Sub centimeter parenchymal cysts in the kidneys.  No solid mass or hydronephrosis is appreciated.  The stomach and small bowel are distended with decompression of the ileum.  Transition zone appears to be in the left mid abdomen, likely due to the lesion.  Scattered stool filled colon with what appears to be a diverting transverse colostomy in the left lower quadrant.  No free air or free fluid in the abdomen.  Calcification of the aorta without aneurysm.  Pelvis:  The bladder wall is not thickened.  2.7 cm enhancing mass in the uterus consistent with uterine fibroid.  No abnormal adnexal lesions.  No free or loculated pelvic fluid collections.  Appendix is normal.  Surgical clips in the rectosigmoid junction.  Prominent lumbar scoliosis with degenerative changes throughout the lumbar spine and hips.  IMPRESSION: Distension of the stomach and proximal small bowel with transition zone in the left mid abdomen, likely due to adhesions.  No  residual diverticulitis.  Postoperative changes with a diverting left lower quadrant colostomy.   Original Report Authenticated By: Burman Nieves, M.D.    Medications: . allopurinol  50 mg Oral Daily  . amLODipine  10 mg Oral Daily  . docusate sodium  100 mg Oral BID  . heparin  5,000 Units Subcutaneous Q8H  . loratadine  10 mg Oral Daily  . metoprolol tartrate  25 mg Oral BID  . pantoprazole  80 mg Oral Q1200  . predniSONE  10 mg Oral Daily    Assessment/Plan SBO S/p Exploratory  laparotomy with sigmoid colectomy and diverting end colostomy with biopsy of peritoneal nodule. 11/27/12 Dr. Biagio Quint 1. Focally perforated diverticulitis  2. Marfan's  3. RA on steroids  4. HTN  5. Legally blind  6. HOH   Plan:  Continue NG suction and hydration, I will start replacing K+, check magnesium.  Magnesium is 1.5, I will replace this also.  LOS: 1 day    Adir Schicker 01/01/2013

## 2013-01-01 NOTE — H&P (Signed)
[name] is an 57 y.o. female.   Chief Complaint: abd pain, nausea, distention HPI: this is a 56 y.o. F with Marfan's syndrome and RA who is legally blind.  She underwent a emergent Hartman's procedure for perforated diverticulitis ~1 mo ago.  Since then she has been at a rehab facility, but was released on Mon.  She has not had an appetite since she got home.   She then developed some nausea and abd pain yesterday.  She noticed that her ostomy bag was not filling up any more.  She began to get more distended today and came to the ED.  She denies any fevers.  Past Medical History  Diagnosis Date  . Legally blind     since pt was a teenager  . Hearing aid worn     pt wears bilateral hearing aids  . Marfan's syndrome affecting skin     with scolosis  . Osteoporosis   . Hypertension   . Substance abuse     recovering alcoholic x12 years   . Hernia 4/08  . 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 Chronic gout 2008  . Stroke 2/08  . Fibroid   . Status post bunionectomy 1/10    bilateral     Past Surgical History  Procedure Laterality Date  . Bunionectomy Bilateral 1/10  . Total knee arthroplasty Bilateral 6/08  . Hernia repair    . Laparotomy N/A 11/27/2012    Procedure: EXPLORATORY LAPAROTOMY SIGMOID COLECTOMY, COLOSTOMY;  Surgeon: Lodema Pilot, DO;  Location: [facility];  Service: General;  Laterality: N/A;  . Colostomy      History reviewed. No pertinent family history. Social History:  reports that she has been smoking Cigarettes.  She has been smoking about 1.00 pack per day. She has never used smokeless tobacco. She reports that she does not drink alcohol or use illicit drugs.  Allergies: No Known Allergies   (Not in a hospital admission)  Results for orders placed during the hospital encounter of 12/31/12 (from the past 48 hour(s))  CBC     Status: Abnormal   Collection Time    12/31/12  9:42 PM      Result Value Range   WBC 10.9 (*) 4.0 - 10.5 K/uL    RBC 4.19  3.87 - 5.11 MIL/uL   Hemoglobin 12.1  12.0 - 15.0 g/dL   HCT 16.1  09.6 - 04.5 %   MCV 88.5  78.0 - 100.0 fL   MCH 28.9  26.0 - 34.0 pg   MCHC 32.6  30.0 - 36.0 g/dL   RDW 40.9 (*) 81.1 - 91.4 %   Platelets 305  150 - 400 K/uL  BASIC METABOLIC PANEL     Status: Abnormal   Collection Time    12/31/12  9:42 PM      Result Value Range   Sodium 141  135 - 145 mEq/L   Potassium 3.4 (*) 3.5 - 5.1 mEq/L   Chloride 102  96 - 112 mEq/L   CO2 24  19 - 32 mEq/L   Glucose, Bld 101 (*) 70 - 99 mg/dL   BUN 12  6 - 23 mg/dL   Creatinine, Ser 7.82  0.50 - 1.10 mg/dL   Calcium 9.7  8.4 - 95.6 mg/dL   GFR calc non Af Amer >90  >90 mL/min   GFR calc Af Amer >90  >90 mL/min   Comment:  The eGFR has been calculated     using the CKD EPI equation.     This calculation has not been     validated in all clinical     situations.     eGFR's persistently     <90 mL/min signify     possible Chronic Kidney Disease.   Ct Abdomen Pelvis W Contrast  12/31/2012   *RADIOLOGY REPORT*  Clinical Data: Abdominal pain.  Recent colectomy with abdominal pain and no output.  CT ABDOMEN AND PELVIS WITH CONTRAST  Technique:  Multidetector CT imaging of the abdomen and pelvis was performed following the standard protocol during bolus administration of intravenous contrast.  Contrast: 80mL OMNIPAQUE IOHEXOL 300 MG/ML  SOLN, 50mL OMNIPAQUE IOHEXOL 300 MG/ML  SOLN  Comparison: 11/27/2012  Findings: Lung bases are clear.  Coronary artery calcification.  Circumscribed cyst in the medial segment left lobe of liver measuring 18 mm diameter and stable since the previous study.  The gallbladder is contracted.  Pancreas, spleen, adrenal glands, inferior vena cava, and retroperitoneal lymph nodes are unremarkable.  Sub centimeter parenchymal cysts in the kidneys.  No solid mass or hydronephrosis is appreciated.  The stomach and small bowel are distended with decompression of the ileum.  Transition zone appears to be in  the left mid abdomen, likely due to the lesion.  Scattered stool filled colon with what appears to be a diverting transverse colostomy in the left lower quadrant.  No free air or free fluid in the abdomen.  Calcification of the aorta without aneurysm.  Pelvis:  The bladder wall is not thickened.  2.7 cm enhancing mass in the uterus consistent with uterine fibroid.  No abnormal adnexal lesions.  No free or loculated pelvic fluid collections.  Appendix is normal.  Surgical clips in the rectosigmoid junction.  Prominent lumbar scoliosis with degenerative changes throughout the lumbar spine and hips.  IMPRESSION: Distension of the stomach and proximal small bowel with transition zone in the left mid abdomen, likely due to adhesions.  No residual diverticulitis.  Postoperative changes with a diverting left lower quadrant colostomy.   Original Report Authenticated By: Burman Nieves, M.D.    Review of Systems  Constitutional: Negative for fever and chills.  Respiratory: Negative for cough and shortness of breath.   Cardiovascular: Negative for chest pain.  Gastrointestinal: Positive for nausea and abdominal pain. Negative for vomiting.  Genitourinary: Negative for dysuria.  Musculoskeletal: Negative for myalgias.  Skin: Negative for rash.  Neurological: Negative for dizziness and headaches.    Blood pressure 154/95, pulse 104, temperature 98.8 F (37.1 C), temperature source Oral, resp. rate 22, last menstrual period 06/25/2006, SpO2 99.00%. Physical Exam  Constitutional: She is oriented to person, place, and time. She appears well-developed. No distress.  HENT:  Head: Normocephalic and atraumatic.  Eyes: Conjunctivae are normal. Pupils are equal, round, and reactive to light.  Neck: Normal range of motion.  Cardiovascular: Normal rate and regular rhythm.   Respiratory: Effort normal and breath sounds normal. No respiratory distress.  GI: Soft. She exhibits distension. There is no tenderness.  There is no rebound and no guarding.  Neurological: She is alert and oriented to person, place, and time.  Skin: Skin is warm and dry.     CT-scan of abdomen and pelvis: IMPRESSION: Distension of the stomach and proximal small bowel with transition zone in the left mid abdomen, likely due to adhesions. No residual diverticulitis. Postoperative changes with a diverting left lower quadrant colostomy.  Lab Results  Component Value Date   WBC 10.9* 12/31/2012   HGB 12.1 12/31/2012   HCT 37.1 12/31/2012   MCV 88.5 12/31/2012   PLT 305 12/31/2012   Lab Results  Component Value Date   CREATININE 0.78 12/31/2012     Assessment/Plan TELITHA PLATH is a 57 y.o. F s/p Hartman's procedure 1 mo ago.  She now appears to have a SBO.  I have ordered an NG tube to be placed.  We will start some IV fluids for hydration.  I will continue her home meds.  She will need daily wound care.  I explained to her that these usually resolve with NG decompression.    Pedro Whiters C. 01/01/2013, 12:11 AM

## 2013-01-02 ENCOUNTER — Inpatient Hospital Stay (HOSPITAL_COMMUNITY): Payer: Medicare Other

## 2013-01-02 LAB — COMPREHENSIVE METABOLIC PANEL
ALT: 12 U/L (ref 0–35)
AST: 14 U/L (ref 0–37)
Albumin: 2.7 g/dL — ABNORMAL LOW (ref 3.5–5.2)
Alkaline Phosphatase: 73 U/L (ref 39–117)
BUN: 13 mg/dL (ref 6–23)
CO2: 27 mEq/L (ref 19–32)
Calcium: 8.7 mg/dL (ref 8.4–10.5)
Chloride: 107 mEq/L (ref 96–112)
Creatinine, Ser: 0.64 mg/dL (ref 0.50–1.10)
GFR calc Af Amer: >90 mL/min (ref >90)
GFR calc non Af Amer: >90 mL/min (ref >90)
Glucose, Bld: 109 mg/dL — ABNORMAL HIGH (ref 70–99)
Potassium: 4.2 mEq/L (ref 3.5–5.1)
Sodium: 142 mEq/L (ref 135–145)
Total Bilirubin: 0.7 mg/dL (ref 0.3–1.2)
Total Protein: 6.2 g/dL (ref 6.0–8.3)

## 2013-01-02 LAB — CBC
HCT: 34.8 % — ABNORMAL LOW (ref 36.0–46.0)
Hemoglobin: 10.9 g/dL — ABNORMAL LOW (ref 12.0–15.0)
MCH: 28.2 pg (ref 26.0–34.0)
MCHC: 31.3 g/dL (ref 30.0–36.0)
MCV: 89.9 fL (ref 78.0–100.0)
Platelets: 267 10*3/uL (ref 150–400)
RBC: 3.87 MIL/uL (ref 3.87–5.11)
RDW: 16 % — ABNORMAL HIGH (ref 11.5–15.5)
WBC: 6.4 10*3/uL (ref 4.0–10.5)

## 2013-01-02 LAB — MAGNESIUM: Magnesium: 2 mg/dL (ref 1.5–2.5)

## 2013-01-02 NOTE — Progress Notes (Signed)
Patient interviewed and examined, agree with PA note above.  Mariella Saa MD, FACS  01/02/2013 12:44 PM

## 2013-01-02 NOTE — Progress Notes (Signed)
Subjective: [name] is working currently stomach is well drained, she says she's had some nausea.  Nothing thru the ostomy yet, there is some clear fluid, not much gas in ostomy bag.  Objective: Vital signs in last 24 hours: Temp:  [98 F (36.7 C)-100.5 F (38.1 C)] 98.9 F (37.2 C) (07/11 0600) Pulse Rate:  [84-108] 84 (07/11 0600) Resp:  [14-20] 14 (07/11 0600) BP: (128-151)/(76-93) 151/76 mmHg (07/11 0600) SpO2:  [95 %-97 %] 97 % (07/11 0600)   2350 ml fron [name]/Emesis yesterday, Only 450 ml urine output. TM 100.5, VSS Labs are stable. Films still pending Intake/Output from previous day: 07/10 0701 - 07/11 0700 In: 2893.8 [I.V.:2893.8] Out: 2800 [Urine:450; Emesis/[name] output:2350] Intake/Output this shift:    General appearance: alert, cooperative and no distress Resp: clear to auscultation bilaterally and ant exam GI: soft, less distended, few BS,  wound is healing nicely.  Lab Results:   Recent Labs  12/31/12 2142 01/02/13 0338  WBC 10.9* 6.4  HGB 12.1 10.9*  HCT 37.1 34.8*  PLT 305 267    BMET  Recent Labs  01/01/13 0525 01/02/13 0338  NA 141 142  K 3.0* 4.2  CL 105 107  CO2 26 27  GLUCOSE 147* 109*  BUN 9 13  CREATININE 0.69 0.64  CALCIUM 8.9 8.7   PT/INR No results found for this basename: LABPROT, INR,  in the last 72 hours   Recent Labs Lab 01/02/13 0338  AST 14  ALT 12  ALKPHOS 73  BILITOT 0.7  PROT 6.2  ALBUMIN 2.7*     Lipase     Component Value Date/Time   LIPASE 31 11/26/2012 2330     Studies/Results: Ct Abdomen Pelvis W Contrast  12/31/2012   *RADIOLOGY REPORT*  Clinical Data: Abdominal pain.  Recent colectomy with abdominal pain and no output.  CT ABDOMEN AND PELVIS WITH CONTRAST  Technique:  Multidetector CT imaging of the abdomen and pelvis was performed following the standard protocol during bolus administration of intravenous contrast.  Contrast: 80mL OMNIPAQUE IOHEXOL 300 MG/ML  SOLN, 50mL OMNIPAQUE IOHEXOL 300 MG/ML  SOLN   Comparison: 11/27/2012  Findings: Lung bases are clear.  Coronary artery calcification.  Circumscribed cyst in the medial segment left lobe of liver measuring 18 mm diameter and stable since the previous study.  The gallbladder is contracted.  Pancreas, spleen, adrenal glands, inferior vena cava, and retroperitoneal lymph nodes are unremarkable.  Sub centimeter parenchymal cysts in the kidneys.  No solid mass or hydronephrosis is appreciated.  The stomach and small bowel are distended with decompression of the ileum.  Transition zone appears to be in the left mid abdomen, likely due to the lesion.  Scattered stool filled colon with what appears to be a diverting transverse colostomy in the left lower quadrant.  No free air or free fluid in the abdomen.  Calcification of the aorta without aneurysm.  Pelvis:  The bladder wall is not thickened.  2.7 cm enhancing mass in the uterus consistent with uterine fibroid.  No abnormal adnexal lesions.  No free or loculated pelvic fluid collections.  Appendix is normal.  Surgical clips in the rectosigmoid junction.  Prominent lumbar scoliosis with degenerative changes throughout the lumbar spine and hips.  IMPRESSION: Distension of the stomach and proximal small bowel with transition zone in the left mid abdomen, likely due to adhesions.  No residual diverticulitis.  Postoperative changes with a diverting left lower quadrant colostomy.   Original Report Authenticated By: Burman Nieves, M.D.  Medications: . allopurinol  50 mg Oral Daily  . amLODipine  10 mg Oral Daily  . docusate sodium  100 mg Oral BID  . heparin  5,000 Units Subcutaneous Q8H  . loratadine  10 mg Oral Daily  . metoprolol tartrate  25 mg Oral BID  . pantoprazole (PROTONIX) IV  40 mg Intravenous Q12H  . predniSONE  10 mg Oral Daily    Assessment/Plan SBO  S/p Exploratory laparotomy with sigmoid colectomy and diverting end colostomy with biopsy of peritoneal nodule.  11/27/12 Dr. Biagio Quint  1.  Focally perforated diverticulitis  2. Marfan's  3. RA on steroids  4. HTN  5. Legally blind  6. HOH   Plan:  Continue [name] decompression, films pending, IV hydration.   LOS: 2 days    [name] 01/02/2013

## 2013-01-03 NOTE — Progress Notes (Signed)
Patient ID: [name], female   DOB: 02/26/1956, 57 y.o.   MRN: [id number] [facility] Surgery Progress Note:   * No surgery found *  Subjective: Mental status is clear although she is blind;   Objective: Vital signs in last 24 hours: Temp:  [98.6 F (37 C)-99.3 F (37.4 C)] 98.6 F (37 C) (07/12 0505) Pulse Rate:  [80-89] 85 (07/12 0943) Resp:  [14-18] 18 (07/12 0505) BP: (146-154)/(74-80) 151/75 mmHg (07/12 0943) SpO2:  [99 %-100 %] 100 % (07/12 0505)  Intake/Output from previous day: 07/11 0701 - 07/12 0700 In: 3000 [I.V.:3000] Out: 2300 [Urine:850; Emesis/NG output:1450] Intake/Output this shift:    Physical Exam: Work of breathing is normal appearing.  No gas or liquid in ostomy;  Some bilious ng drainage.    Lab Results:  Results for orders placed during the hospital encounter of 12/31/12 (from the past 48 hour(s))  CBC     Status: Abnormal   Collection Time    01/02/13  3:38 AM      Result Value Range   WBC 6.4  4.0 - 10.5 K/uL   RBC 3.87  3.87 - 5.11 MIL/uL   Hemoglobin 10.9 (*) 12.0 - 15.0 g/dL   HCT 40.9 (*) 81.1 - 91.4 %   MCV 89.9  78.0 - 100.0 fL   MCH 28.2  26.0 - 34.0 pg   MCHC 31.3  30.0 - 36.0 g/dL   RDW 78.2 (*) 95.6 - 21.3 %   Platelets 267  150 - 400 K/uL  MAGNESIUM     Status: None   Collection Time    01/02/13  3:38 AM      Result Value Range   Magnesium 2.0  1.5 - 2.5 mg/dL  COMPREHENSIVE METABOLIC PANEL     Status: Abnormal   Collection Time    01/02/13  3:38 AM      Result Value Range   Sodium 142  135 - 145 mEq/L   Potassium 4.2  3.5 - 5.1 mEq/L   Comment: RESULT REPEATED AND VERIFIED     DELTA CHECK NOTED   Chloride 107  96 - 112 mEq/L   CO2 27  19 - 32 mEq/L   Glucose, Bld 109 (*) 70 - 99 mg/dL   BUN 13  6 - 23 mg/dL   Creatinine, Ser 0.86  0.50 - 1.10 mg/dL   Calcium 8.7  8.4 - 57.8 mg/dL   Total Protein 6.2  6.0 - 8.3 g/dL   Albumin 2.7 (*) 3.5 - 5.2 g/dL   AST 14  0 - 37 U/L   ALT 12  0 - 35 U/L   Alkaline  Phosphatase 73  39 - 117 U/L   Total Bilirubin 0.7  0.3 - 1.2 mg/dL   GFR calc non Af Amer >90  >90 mL/min   GFR calc Af Amer >90  >90 mL/min   Comment:            The eGFR has been calculated     using the CKD EPI equation.     This calculation has not been     validated in all clinical     situations.     eGFR's persistently     <90 mL/min signify     possible Chronic Kidney Disease.    Radiology/Results: Dg Abd 2 Views  01/02/2013   *RADIOLOGY REPORT*  Clinical Data: Small bowel obstruction.  ABDOMEN - 2 VIEW  Comparison: 12/31/2012 CT.  Findings: Supine and right-sided  decubitus views.  The decubitus view demonstrates no free intraperitoneal air.  Osteopenia and convex left lumbar spine curvature.  Air fluid levels, felt to be within small bowel loops.  This supine view demonstrates  small bowel distention at up to 5.1 cm.  Increased from 4.1 cm maximally on the prior exam.  No pneumatosis.  Relative paucity of colonic gas.  Nasogastric tube terminating at the body of the stomach.  Probable contrast within the urinary bladder.  Bilateral hip osteoarthritis.  IMPRESSION: Increase in small bowel dilatation, consistent with small bowel obstruction.  No pneumatosis, free intraperitoneal air, or other acute complication.   Original Report Authenticated By: Jeronimo Greaves, M.D.    Anti-infectives: Anti-infectives   None      Assessment/Plan: Problem List: Patient Active Problem List   Diagnosis Date Noted  . Shock 11/27/2012  . Acute blood loss anemia 11/27/2012  . Hypokalemia 11/27/2012  . HEMATOMA 11/15/2009  . LEG EDEMA 09/26/2009  . RHEUMATOID ARTHRITIS 06/07/2009  . OTITIS EXTERNA 01/10/2009  . CELLULITIS AND ABSCESS OF LEG EXCEPT FOOT 07/22/2008  . ALLERGIC REACTION 03/22/2008  . THYROID NODULE 07/01/2007  . LUNG NODULE 07/01/2007  . OSTEOPOROSIS 05/07/2007  . 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 BLINDNESS, BILATERAL 04/12/2007  . HEMORRHOIDS, INTERNAL 04/12/2007   . GOUTY ARTHROPATHY 01/14/2007  . HYPERLIPIDEMIA 01/07/2007  . HYPERTENSION 01/07/2007  . GERD 01/07/2007  . OSTEOARTHRITIS 01/07/2007  . MARFAN'S SYNDROME 01/07/2007    Continue NG suction and observe  * No surgery found *    LOS: 3 days   Matt B. Daphine Deutscher, MD, [facility] [phone] beeper [phone]  01/03/2013 9:52 AM

## 2013-01-04 ENCOUNTER — Inpatient Hospital Stay (HOSPITAL_COMMUNITY): Payer: Medicare Other

## 2013-01-04 NOTE — Progress Notes (Addendum)
Patient ID: [name], female   DOB: 1955-10-24, 57 y.o.   MRN: [id number] [facility] Surgery Progress Note:   * No surgery found *  Subjective: Mental status is unchanged.  Not so talkative.  No pain complaints Objective: Vital signs in last 24 hours: Temp:  [98.5 F (36.9 C)-98.6 F (37 C)] 98.6 F (37 C) (07/13 0531) Pulse Rate:  [76-85] 76 (07/13 0531) Resp:  [18] 18 (07/13 0531) BP: (131-151)/(68-83) 131/77 mmHg (07/13 0531) SpO2:  [100 %] 100 % (07/13 0531)  Intake/Output from previous day: 07/12 0701 - 07/13 0700 In: 3112.5 [P.O.:50; I.V.:3062.5] Out: 2800 [Urine:1550; Emesis/NG output:1250] Intake/Output this shift:    Physical Exam: Work of breathing is not labored.  Ostomy without drainage; NG with bilious drainage  Lab Results:  No results found for this or any previous visit (from the past 48 hour(s)).  Radiology/Results: Dg Abd 2 Views  01/02/2013   *RADIOLOGY REPORT*  Clinical Data: Small bowel obstruction.  ABDOMEN - 2 VIEW  Comparison: 12/31/2012 CT.  Findings: Supine and right-sided decubitus views.  The decubitus view demonstrates no free intraperitoneal air.  Osteopenia and convex left lumbar spine curvature.  Air fluid levels, felt to be within small bowel loops.  This supine view demonstrates  small bowel distention at up to 5.1 cm.  Increased from 4.1 cm maximally on the prior exam.  No pneumatosis.  Relative paucity of colonic gas.  Nasogastric tube terminating at the body of the stomach.  Probable contrast within the urinary bladder.  Bilateral hip osteoarthritis.  IMPRESSION: Increase in small bowel dilatation, consistent with small bowel obstruction.  No pneumatosis, free intraperitoneal air, or other acute complication.   Original Report Authenticated By: Jeronimo Greaves, M.D.    Anti-infectives: Anti-infectives   None      Assessment/Plan: Problem List: Patient Active Problem List   Diagnosis Date Noted  . Shock 11/27/2012  . Acute blood  loss anemia 11/27/2012  . Hypokalemia 11/27/2012  . HEMATOMA 11/15/2009  . LEG EDEMA 09/26/2009  . RHEUMATOID ARTHRITIS 06/07/2009  . OTITIS EXTERNA 01/10/2009  . CELLULITIS AND ABSCESS OF LEG EXCEPT FOOT 07/22/2008  . ALLERGIC REACTION 03/22/2008  . THYROID NODULE 07/01/2007  . LUNG NODULE 07/01/2007  . OSTEOPOROSIS 05/07/2007  . 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 BLINDNESS, BILATERAL 04/12/2007  . HEMORRHOIDS, INTERNAL 04/12/2007  . GOUTY ARTHROPATHY 01/14/2007  . HYPERLIPIDEMIA 01/07/2007  . HYPERTENSION 01/07/2007  . GERD 01/07/2007  . OSTEOARTHRITIS 01/07/2007  . MARFAN'S SYNDROME 01/07/2007    Abdominal xrays pending.  Will assess * No surgery found *    LOS: 4 days   Matt B. Daphine Deutscher, MD, [facility] [phone] beeper [phone]  01/04/2013 7:47 AM X ray reviewed and discussed with Dr. Johna Sheriff.  Laparotomy recommended and discussed with Ms. Pryer.  She doesn't want surgery unless it is absolutely necessary.  Since she is in no pain, she wants to wait and reassess tomorrow.  Repeal xrays and lab ordered.

## 2013-01-05 ENCOUNTER — Inpatient Hospital Stay (HOSPITAL_COMMUNITY): Payer: Medicare Other

## 2013-01-05 DIAGNOSIS — E46 Unspecified protein-calorie malnutrition: Secondary | ICD-10-CM

## 2013-01-05 DIAGNOSIS — E875 Hyperkalemia: Secondary | ICD-10-CM

## 2013-01-05 DIAGNOSIS — K5720 Diverticulitis of large intestine with perforation and abscess without bleeding: Secondary | ICD-10-CM

## 2013-01-05 DIAGNOSIS — K56609 Unspecified intestinal obstruction, unspecified as to partial versus complete obstruction: Secondary | ICD-10-CM

## 2013-01-05 HISTORY — DX: Diverticulitis of large intestine with perforation and abscess without bleeding: K57.20

## 2013-01-05 HISTORY — DX: Unspecified intestinal obstruction, unspecified as to partial versus complete obstruction: K56.609

## 2013-01-05 LAB — BASIC METABOLIC PANEL
BUN: 7 mg/dL (ref 6–23)
BUN: 6 mg/dL (ref 6–23)
CO2: 14 mEq/L — ABNORMAL LOW (ref 19–32)
CO2: 16 mEq/L — ABNORMAL LOW (ref 19–32)
Calcium: 9 mg/dL (ref 8.4–10.5)
Calcium: 9 mg/dL (ref 8.4–10.5)
Chloride: 101 mEq/L (ref 96–112)
Chloride: 98 mEq/L (ref 96–112)
Creatinine, Ser: 0.55 mg/dL (ref 0.50–1.10)
Creatinine, Ser: 0.53 mg/dL (ref 0.50–1.10)
GFR calc Af Amer: >90 mL/min (ref >90)
GFR calc Af Amer: >90 mL/min (ref >90)
GFR calc non Af Amer: >90 mL/min (ref >90)
GFR calc non Af Amer: >90 mL/min (ref >90)
Glucose, Bld: 65 mg/dL — ABNORMAL LOW (ref 70–99)
Glucose, Bld: 92 mg/dL (ref 70–99)
Potassium: 5.3 mEq/L — ABNORMAL HIGH (ref 3.5–5.1)
Potassium: 4.7 mEq/L (ref 3.5–5.1)
Sodium: 134 mEq/L — ABNORMAL LOW (ref 135–145)
Sodium: 132 mEq/L — ABNORMAL LOW (ref 135–145)

## 2013-01-05 LAB — HEPATIC FUNCTION PANEL
ALT: 9 U/L (ref 0–35)
AST: 12 U/L (ref 0–37)
Albumin: 3 g/dL — ABNORMAL LOW (ref 3.5–5.2)
Alkaline Phosphatase: 82 U/L (ref 39–117)
Bilirubin, Direct: 0.2 mg/dL (ref 0.0–0.3)
Indirect Bilirubin: 0.4 mg/dL (ref 0.3–0.9)
Total Bilirubin: 0.6 mg/dL (ref 0.3–1.2)
Total Protein: 7.1 g/dL (ref 6.0–8.3)

## 2013-01-05 LAB — CBC
HCT: 36.6 % (ref 36.0–46.0)
Hemoglobin: 11.7 g/dL — ABNORMAL LOW (ref 12.0–15.0)
MCH: 28.2 pg (ref 26.0–34.0)
MCHC: 32 g/dL (ref 30.0–36.0)
MCV: 88.2 fL (ref 78.0–100.0)
Platelets: 257 10*3/uL (ref 150–400)
RBC: 4.15 MIL/uL (ref 3.87–5.11)
RDW: 15.2 % (ref 11.5–15.5)
WBC: 6.1 10*3/uL (ref 4.0–10.5)

## 2013-01-05 LAB — PREALBUMIN: Prealbumin: 10.3 mg/dL — ABNORMAL LOW (ref 17.0–34.0)

## 2013-01-05 LAB — GLUCOSE, CAPILLARY: Glucose-Capillary: 84 mg/dL (ref 70–99)

## 2013-01-05 MED ORDER — SODIUM CHLORIDE 0.9 % IJ SOLN
10.0000 mL | INTRAMUSCULAR | Status: DC | PRN
  Administered 2013-01-05 – 2013-01-08 (×3): 10 mL

## 2013-01-05 MED ORDER — DEXTROSE-NACL 5-0.45 % IV SOLN
INTRAVENOUS | Status: DC
  Administered 2013-01-05: via INTRAVENOUS

## 2013-01-05 MED ORDER — DEXTROSE-NACL 5-0.45 % IV SOLN
INTRAVENOUS | Status: AC
  Administered 2013-01-05: 11:00:00 via INTRAVENOUS

## 2013-01-05 MED ORDER — METOPROLOL TARTRATE 1 MG/ML IV SOLN
5.0000 mg | Freq: Four times a day (QID) | INTRAVENOUS | Status: DC
  Administered 2013-01-05 – 2013-01-13 (×31): 5 mg via INTRAVENOUS
  Filled 2013-01-05 (×36): qty 5

## 2013-01-05 MED ORDER — METOPROLOL TARTRATE 1 MG/ML IV SOLN
5.0000 mg | Freq: Four times a day (QID) | INTRAVENOUS | Status: DC | PRN
  Administered 2013-01-06 (×2): 1 mg via INTRAVENOUS
  Filled 2013-01-05: qty 5

## 2013-01-05 MED ORDER — LIP MEDEX EX OINT
1.0000 "application " | TOPICAL_OINTMENT | Freq: Two times a day (BID) | CUTANEOUS | Status: DC
  Administered 2013-01-05 – 2013-01-13 (×15): 1 via TOPICAL
  Filled 2013-01-05 (×2): qty 7

## 2013-01-05 MED ORDER — FAT EMULSION 20 % IV EMUL
250.0000 mL | INTRAVENOUS | Status: DC
  Administered 2013-01-05: 250 mL via INTRAVENOUS
  Filled 2013-01-05: qty 250

## 2013-01-05 MED ORDER — INSULIN ASPART 100 UNIT/ML ~~LOC~~ SOLN
0.0000 [IU] | Freq: Four times a day (QID) | SUBCUTANEOUS | Status: DC
  Administered 2013-01-06: 2 [IU] via SUBCUTANEOUS
  Administered 2013-01-06: 1 [IU] via SUBCUTANEOUS
  Administered 2013-01-06 – 2013-01-07 (×4): 2 [IU] via SUBCUTANEOUS
  Administered 2013-01-07 – 2013-01-08 (×2): 1 [IU] via SUBCUTANEOUS

## 2013-01-05 MED ORDER — MAGIC MOUTHWASH
15.0000 mL | Freq: Four times a day (QID) | ORAL | Status: DC | PRN
  Filled 2013-01-05: qty 15

## 2013-01-05 MED ORDER — ALUM & MAG HYDROXIDE-SIMETH 200-200-20 MG/5ML PO SUSP: 30.0000 mL | Freq: Four times a day (QID) | ORAL | Status: DC | PRN

## 2013-01-05 MED ORDER — CLINIMIX E/DEXTROSE (5/15) 5 % IV SOLN
INTRAVENOUS | Status: DC
  Administered 2013-01-05: 19:00:00 via INTRAVENOUS
  Filled 2013-01-05: qty 1000

## 2013-01-05 MED ORDER — LACTATED RINGERS IV BOLUS (SEPSIS): 1000.0000 mL | Freq: Three times a day (TID) | INTRAVENOUS | Status: AC | PRN

## 2013-01-05 MED ORDER — BISACODYL 10 MG RE SUPP: 10.0000 mg | Freq: Two times a day (BID) | RECTAL | Status: DC | PRN

## 2013-01-05 NOTE — Progress Notes (Signed)
PARENTERAL NUTRITION CONSULT NOTE - INITIAL  Pharmacy Consult for TNA Indication: SBO  No Known Allergies  Patient Measurements: Height: 5\' 8"  (172.7 cm) Weight: 134 lb 14.7 oz (61.2 kg) IBW/kg (Calculated) : 63.9   Vital Signs: Temp: 97.5 F (36.4 C) (07/14 0546) Temp src: Oral (07/14 0546) BP: 148/94 mmHg (07/14 0620) Pulse Rate: 89 (07/14 0546) Intake/Output from previous day: 07/13 0701 - 07/14 0700 In: 2950 [I.V.:2950] Out: 3250 [Urine:1700; Emesis/NG output:1550] Intake/Output from this shift: Total I/O In: -  Out: 1000 [Urine:500; Emesis/NG output:500]  Labs:  Recent Labs  01/05/13 0453  WBC 6.1  HGB 11.7*  HCT 36.6  PLT 257     Recent Labs  01/05/13 0453  NA 134*  K 5.3*  CL 101  CO2 14*  GLUCOSE 65*  BUN 7  CREATININE 0.55  CALCIUM 9.0   Estimated Creatinine Clearance: 75.9 ml/min (by C-G formula based on Cr of 0.55).   No results found for this basename: GLUCAP,  in the last 72 hours  Medical History: Past Medical History  Diagnosis Date  . Legally blind     since pt was a teenager  . Hearing aid worn     pt wears bilateral hearing aids  . Marfan's syndrome affecting skin     with scolosis  . Osteoporosis   . Hypertension   . Substance abuse     recovering alcoholic x12 years   . Hernia 4/08  . 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 Chronic gout 2008  . Stroke 2/08  . Fibroid   . Status post bunionectomy 1/10    bilateral     Medications:  Scheduled:  . allopurinol  50 mg Oral Daily  . amLODipine  10 mg Oral Daily  . heparin  5,000 Units Subcutaneous Q8H  . lip balm  1 application Topical BID  . loratadine  10 mg Oral Daily  . metoprolol  5 mg Intravenous Q6H  . pantoprazole (PROTONIX) IV  40 mg Intravenous Q12H  . predniSONE  10 mg Oral Daily   Infusions:  . dextrose 5 % and 0.45% NaCl     PRN: albuterol, alum & mag hydroxide-simeth, bisacodyl, cyclobenzaprine, HYDROmorphone (DILAUDID) injection,  lactated ringers, magic mouthwash, metoprolol, ondansetron  Insulin Requirements in the past 24 hours:  n/a  Current Nutrition:  NPO  Assessment: 57 y/o F s/p emergent Hartmann's procedure 11/27/12 for diverticulitis with perforation, readmitted 7/10 with SBO that persists.  For PICC placement and TNA initiation today.  Nutritional Goals:  Dietitian consult pending Estimated needs approximately 1800 KCal/day, 90 grams/protein day.   Goal formula/rate:  Clinimix-E 5/15 at 80 mL/hr, Fat Emulsion 20% at 10 mL/hr.  Plan:  Tonight at 1800:  Begin Clinimix-E 5/15 at 40 mL/hr and Fat Emulsion 20% at 10 mL/hr.  Reduce maintenance IVF rate to 25 mL/hr  Begin CBGs q6h, cover with sensitive-scale sliding scale Novolog as needed.  Multivitamins and trace elements in TNA daily. TNA labs in AM, then every Monday and Thursday  Elie Goody, PharmD, BCPS Pager: [phone] 01/05/2013  10:09 AM

## 2013-01-05 NOTE — Progress Notes (Signed)
NGT flushed w air - nausea decreased Moderate NGT output but pt notes pain & distention is less.    abd soft/flat minimally distended Wound granulating - exposed PDS stitches removed Ostomy pink No gas/stool AXR with colonic gas but still SB distention  Ct Abdomen Pelvis W Contrast  12/31/2012   *RADIOLOGY REPORT*  Clinical Data: Abdominal pain.  Recent colectomy with abdominal pain and no output.  CT ABDOMEN AND PELVIS WITH CONTRAST  Technique:  Multidetector CT imaging of the abdomen and pelvis was performed following the standard protocol during bolus administration of intravenous contrast.  Contrast: 80mL OMNIPAQUE IOHEXOL 300 MG/ML  SOLN, 50mL OMNIPAQUE IOHEXOL 300 MG/ML  SOLN  Comparison: 11/27/2012  Findings: Lung bases are clear.  Coronary artery calcification.  Circumscribed cyst in the medial segment left lobe of liver measuring 18 mm diameter and stable since the previous study.  The gallbladder is contracted.  Pancreas, spleen, adrenal glands, inferior vena cava, and retroperitoneal lymph nodes are unremarkable.  Sub centimeter parenchymal cysts in the kidneys.  No solid mass or hydronephrosis is appreciated.  The stomach and small bowel are distended with decompression of the ileum.  Transition zone appears to be in the left mid abdomen, likely due to the lesion.  Scattered stool filled colon with what appears to be a diverting transverse colostomy in the left lower quadrant.  No free air or free fluid in the abdomen.  Calcification of the aorta without aneurysm.  Pelvis:  The bladder wall is not thickened.  2.7 cm enhancing mass in the uterus consistent with uterine fibroid.  No abnormal adnexal lesions.  No free or loculated pelvic fluid collections.  Appendix is normal.  Surgical clips in the rectosigmoid junction.  Prominent lumbar scoliosis with degenerative changes throughout the lumbar spine and hips.  IMPRESSION: Distension of the stomach and proximal small bowel with transition zone in  the left mid abdomen, likely due to adhesions.  No residual diverticulitis.  Postoperative changes with a diverting left lower quadrant colostomy.   Original Report Authenticated By: Burman Nieves, M.D.   Dg Abd 2 Views  01/04/2013   *RADIOLOGY REPORT*  Clinical Data: Small bowel obstruction.  Abdominal pain and distention.  ABDOMEN - 2 VIEW  Comparison: 01/02/2013  Findings: Multiple dilated small bowel loops are again seen, without significant change.  Ostomy ring again seen in the left abdomen.  Nasogastric tube remains in appropriate position within the stomach.  IMPRESSION: Stable small bowel dilatation, with nasogastric tube in appropriate position.   Original Report Authenticated By: Myles Rosenthal, M.D.   Dg Abd 2 Views  01/02/2013   *RADIOLOGY REPORT*  Clinical Data: Small bowel obstruction.  ABDOMEN - 2 VIEW  Comparison: 12/31/2012 CT.  Findings: Supine and right-sided decubitus views.  The decubitus view demonstrates no free intraperitoneal air.  Osteopenia and convex left lumbar spine curvature.  Air fluid levels, felt to be within small bowel loops.  This supine view demonstrates  small bowel distention at up to 5.1 cm.  Increased from 4.1 cm maximally on the prior exam.  No pneumatosis.  Relative paucity of colonic gas.  Nasogastric tube terminating at the body of the stomach.  Probable contrast within the urinary bladder.  Bilateral hip osteoarthritis.  IMPRESSION: Increase in small bowel dilatation, consistent with small bowel obstruction.  No pneumatosis, free intraperitoneal air, or other acute complication.   Original Report Authenticated By: Jeronimo Greaves, M.D.   A/p: Persistent SBO 5 weeks s/p colectomy/ostomy - will need surgery soon if  not better - pt wishes to wait still  The patient is stable.  There is no evidence of peritonitis, acute abdomen, nor shock.  There is no strong evidence of decline with current non-operative management.  There is no urgent need for surgery at the present  moment.  We will continue to follow.  -high K - agree w changing IVF  -PICC/TNA

## 2013-01-05 NOTE — Progress Notes (Signed)
TNA hung via new right arm PICC - barcode on TNA bag would not scan but did scan Lipids. Name and MRN rechecked for verification right patient-right medication

## 2013-01-05 NOTE — Evaluation (Addendum)
Physical Therapy Evaluation Patient Details Name: [name] MRN: [id number] DOB: Dec 18, 1955 Today's Date: 01/05/2013 Time: 1320-1340 PT Time Calculation (min): 20 min  PT Assessment / Plan / Recommendation History of Present Illness  57 y.o. female with h/o Marfan's  syndrome and RA who is legally blind.  She underwent a emergent Hartman's procedure for perforated diverticulitis ~1 mo ago.  Since then she has been at a rehab facility, but was released on 12/29/12.  She has not had an appetite since she got home.   Pt admitted with SBO.    Clinical Impression  *Min assist to pivot bed to chair. Will likely require SNF level of care upon acute DC. Would benefit from acute PT to maximize safety and independence with mobility. **    PT Assessment  Patient needs continued PT services    Follow Up Recommendations  SNF    Does the patient have the potential to tolerate intense rehabilitation      Barriers to Discharge        Equipment Recommendations  None recommended by PT    Recommendations for Other Services OT consult   Frequency Min 3X/week    Precautions / Restrictions Precautions Precautions: Fall Precaution Comments: Pt is blind, NG tube, ostomy   Pertinent Vitals/Pain **no c/o pain*      Mobility  Bed Mobility Bed Mobility: Supine to Sit Supine to Sit: 4: Min assist;HOB elevated Transfers Transfers: Sit to Stand;Stand to Sit;Stand Pivot Transfers Sit to Stand: 4: Min assist;From bed Stand to Sit: 4: Min assist;To chair/3-in-1 Stand Pivot Transfers: 4: Min assist Details for Transfer Assistance: min A to guide pt to recliner 2* blindness Ambulation/Gait Ambulation/Gait Assistance: Not tested (comment)    Exercises     PT Diagnosis: Generalized weakness;Difficulty walking  PT Problem List: Decreased activity tolerance;Decreased mobility;Decreased strength PT Treatment Interventions: Gait training;DME instruction;Functional mobility training;Therapeutic  activities;Therapeutic exercise;Patient/family education     PT Goals(Current goals can be found in the care plan section) Acute Rehab PT Goals Patient Stated Goal: to be able to walk PT Goal Formulation: With patient Time For Goal Achievement: 01/19/13 Potential to Achieve Goals: Good  Visit Information  Last PT Received On: 01/05/13 Assistance Needed: +1 History of Present Illness: 57 y.o. female with h/o Marfan's  syndrome and RA who is legally blind.  She underwent a emergent Hartman's procedure for perforated diverticulitis ~1 mo ago.  Since then she has been at a rehab facility, but was released on 12/29/12.  She has not had an appetite since she got home.   Pt admitted with SBO.         Prior Functioning  Home Living Family/patient expects to be discharged to:: Private residence Living Arrangements: Children Available Help at Discharge: Personal care attendant (5 days/week x 2 hours for housekeeping) Type of Home: Apartment Home Access: Level entry Home Layout: One level Home Equipment: Walker - 2 wheels;Shower seat;Bedside commode;Wheelchair - manual Prior Function Level of Independence: Needs assistance Gait / Transfers Assistance Needed: walked with RW independently per pt ADL's / Homemaking Assistance Needed: assist required Communication Communication: HOH Dominant Hand: Right    Cognition  Cognition Arousal/Alertness: Awake/alert Behavior During Therapy: WFL for tasks assessed/performed Overall Cognitive Status: Within Functional Limits for tasks assessed    Extremity/Trunk Assessment Upper Extremity Assessment Upper Extremity Assessment: Generalized weakness;RUE deficits/detail;LUE deficits/detail RUE Deficits / Details: B hands have RA deformities Lower Extremity Assessment Lower Extremity Assessment: Generalized weakness;RLE deficits/detail;LLE deficits/detail RLE Deficits / Details: knee ext +3/5, ankle  externally rotated, can actively DF/PF, hip abd/add  AAROM wfl LLE Deficits / Details: knee ext +3/5, ankle externally rotated, can actively DF/PF, hip abd/add AAROM wfl  Balance Balance Balance Assessed: Yes Static Sitting Balance Static Sitting - Balance Support: Bilateral upper extremity supported;Feet supported Static Sitting - Level of Assistance: 5: Stand by assistance Static Sitting - Comment/# of Minutes: 2 minutes, pt leans posteriorly  End of Session PT - End of Session Activity Tolerance: Patient tolerated treatment well Patient left: in chair;with call bell/phone within reach Nurse Communication: Mobility status  GP     [name] 01/05/2013, 1:52 PM  [phone]

## 2013-01-05 NOTE — Progress Notes (Signed)
Subjective: Ng wasn't working well I irrigated the tube and got sump working she was nauseated when I came in but better now with sump and NG working  300 ml in cannister since film this AM 60, ml was irrigation.  Objective: Vital signs in last 24 hours: Temp:  [97.5 F (36.4 C)-99 F (37.2 C)] 97.5 F (36.4 C) (07/14 0546) Pulse Rate:  [79-95] 89 (07/14 0546) Resp:  [16-18] 16 (07/14 0546) BP: (148-172)/(80-94) 148/94 mmHg (07/14 0620) SpO2:  [97 %-100 %] 98 % (07/14 0546) Last BM Date: Jan 23, 2013 (colostomy with tan/brown small amount of liquid stool noted) No Stool recorded. 1550 from the NG recorded. NPO No significant improvement on  plain film. Afebrile, TM99, BP up some, K+ is up some, WBC, H/H is stable Intake/Output from previous day: 01-24-23 0701 - 07/14 0700 In: 2950 [I.V.:2950] Out: 3250 [Urine:1700; Emesis/NG output:1550] Intake/Output this shift: Total I/O In: -  Out: 500 [Urine:500]  General appearance: alert, cooperative and no distress Resp: clear to auscultation bilaterally GI: soft, she isn't distended, she is not tender, not much in way of bowel sounds, the ostomy looks fine no gase and just some minimal liquid in the ostomy bag.  Lab Results:   Recent Labs  01/05/13 0453  WBC 6.1  HGB 11.7*  HCT 36.6  PLT 257    BMET  Recent Labs  01/05/13 0453  NA 134*  K 5.3*  CL 101  CO2 14*  GLUCOSE 65*  BUN 7  CREATININE 0.55  CALCIUM 9.0   PT/INR No results found for this basename: LABPROT, INR,  in the last 72 hours   Recent Labs Lab 01/02/13 0338  AST 14  ALT 12  ALKPHOS 73  BILITOT 0.7  PROT 6.2  ALBUMIN 2.7*     Lipase     Component Value Date/Time   LIPASE 31 11/26/2012 2330     Studies/Results: Dg Abd 2 Views  01-23-2013   *RADIOLOGY REPORT*  Clinical Data: Small bowel obstruction.  Abdominal pain and distention.  ABDOMEN - 2 VIEW  Comparison: 01/02/2013  Findings: Multiple dilated small bowel loops are again seen, without  significant change.  Ostomy ring again seen in the left abdomen.  Nasogastric tube remains in appropriate position within the stomach.  IMPRESSION: Stable small bowel dilatation, with nasogastric tube in appropriate position.   Original Report Authenticated By: Myles Rosenthal, M.D.    Medications: . allopurinol  50 mg Oral Daily  . amLODipine  10 mg Oral Daily  . docusate sodium  100 mg Oral BID  . heparin  5,000 Units Subcutaneous Q8H  . loratadine  10 mg Oral Daily  . metoprolol tartrate  25 mg Oral BID  . pantoprazole (PROTONIX) IV  40 mg Intravenous Q12H  . predniSONE  10 mg Oral Daily   . 0.9 % NaCl with KCl 40 mEq / L 125 mL/hr at 01/05/13 1610   Assessment/Plan SBO  S/p Exploratory laparotomy with sigmoid colectomy and diverting end colostomy with biopsy of peritoneal nodule.  11/27/12 Dr. Biagio Quint  1. Focally perforated diverticulitis  2. Marfan's  3. RA on steroids  4. HTN  5. Legally blind  6. HOH   Plan:  No improvement from what Dr. Daphine Deutscher described yesterday.  She was home from SNF 3 days before she was admitted, and said she had no appetite, did have a Sandwich last Wed.  Nothing since. Will decrease K+ in IV, check.   LFT's and prealbumin.  Discuss PICC and TNA.  Alos mobilize her and see if we can get her moving.  She lives alone, daughter comes over to the house to help her with colostomy.  She wants to avoid surgery but if she doesn't open soon will have to reconsider.    LOS: 5 days    Jone Panebianco 01/05/2013

## 2013-01-05 NOTE — Progress Notes (Signed)
INITIAL NUTRITION ASSESSMENT  DOCUMENTATION CODES Per approved criteria  -Not Applicable   INTERVENTION: TPN per PharmD, nutrition recommendations below Diet advancement per MD  NUTRITION DIAGNOSIS: Inadequate oral intake related to SBO as evidenced by pt NPO with last PO intake 5 days ago.   Goal: Pt to meet >/= 90% of their estimated nutrition needs  Monitor:  TPN initiation/rate Bowel function Diet advancement/PO intake Weight Labs  Reason for Assessment: Consult for new TNA/TPN  57 y.o. female  Admitting Dx: SBO (small bowel obstruction)  ASSESSMENT: 57 y.o. female with h/o Marfan's syndrome and RA who is legally blind. She underwent a emergent Hartman's procedure for perforated diverticulitis ~1 mo ago. Since then she has been at a rehab facility, but was released on 12/29/12. She has not had an appetite since she got home. Pt admitted with SBO.  Pt complains of some nausea today but, no vomiting. Pt reports that she usually weighs between 140 and 150 lbs and usually eats 2 meals daily. Pt states the last time she ate something was 7/9 and she has not had an appetite since. Pt denies dysphagia and reports that she does not follow any special diet. Per MD note, pt will need surgery soon if not better but, pt wishes to wait still. Weight history shows 13% wt loss in the past month.  Height: Ht Readings from Last 1 Encounters:  01/01/13 5\' 8"  (1.727 m)    Weight: Wt Readings from Last 1 Encounters:  01/01/13 134 lb 14.7 oz (61.2 kg)    Ideal Body Weight: 140 lbs  % Ideal Body Weight: 96%  Wt Readings from Last 10 Encounters:  01/01/13 134 lb 14.7 oz (61.2 kg)  12/18/12 131 lb (59.421 kg)  12/02/12 151 lb 3.8 oz (68.6 kg)  12/02/12 151 lb 3.8 oz (68.6 kg)  10/21/12 144 lb (65.318 kg)  10/08/12 142 lb (64.411 kg)  02/27/12 136 lb (61.689 kg)  09/04/10 157 lb (71.215 kg)  04/03/10 156 lb (70.761 kg)  11/15/09 159 lb (72.122 kg)    Usual Body Weight: 140-150  lbs  % Usual Body Weight: 87-94%  BMI:  Body mass index is 20.52 kg/(m^2).  Estimated Nutritional Needs: Kcal: 1700-1940 Protein: 73-86 grams Fluid: 2.1 L  Skin: mid abdominal incision  Diet Order: NPO  EDUCATION NEEDS: -Education not appropriate at this time   Intake/Output Summary (Last 24 hours) at 01/05/13 1425 Last data filed at 01/05/13 1203  Gross per 24 hour  Intake 2742.5 ml  Output   3850 ml  Net -1107.5 ml    Last BM: 7/13  Labs:   Recent Labs Lab 01/01/13 0525 01/02/13 0338 01/05/13 0453  NA 141 142 134*  K 3.0* 4.2 5.3*  CL 105 107 101  CO2 26 27 14*  BUN 9 13 7   CREATININE 0.69 0.64 0.55  CALCIUM 8.9 8.7 9.0  MG 1.5 2.0  --   GLUCOSE 147* 109* 65*    CBG (last 3)  No results found for this basename: GLUCAP,  in the last 72 hours  Scheduled Meds: . allopurinol  50 mg Oral Daily  . amLODipine  10 mg Oral Daily  . heparin  5,000 Units Subcutaneous Q8H  . [START ON 01/06/2013] insulin aspart  0-9 Units Subcutaneous Q6H  . lip balm  1 application Topical BID  . loratadine  10 mg Oral Daily  . metoprolol  5 mg Intravenous Q6H  . pantoprazole (PROTONIX) IV  40 mg Intravenous Q12H  . predniSONE  10 mg Oral Daily    Continuous Infusions: . dextrose 5 % and 0.45% NaCl 75 mL/hr at 01/05/13 1101  . dextrose 5 % and 0.45% NaCl    . Marland KitchenTPN (CLINIMIX-E) Adult     And  . fat emulsion      Past Medical History  Diagnosis Date  . Legally blind     since pt was a teenager  . Hearing aid worn     pt wears bilateral hearing aids  . Marfan's syndrome affecting skin     with scolosis  . Osteoporosis   . Hypertension   . Substance abuse     recovering alcoholic x12 years   . Hernia 4/08  . 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 Chronic gout 2008  . Stroke 2/08  . Fibroid   . Status post bunionectomy 1/10    bilateral     Past Surgical History  Procedure Laterality Date  . Bunionectomy Bilateral 1/10  . Total knee  arthroplasty Bilateral 6/08  . Hernia repair    . Laparotomy N/A 11/27/2012    Procedure: EXPLORATORY LAPAROTOMY SIGMOID COLECTOMY, COLOSTOMY;  Surgeon: Lodema Pilot, DO;  Location: [facility];  Service: General;  Laterality: N/A;  . Colostomy      Ian Malkin RD, LDN Inpatient Clinical Dietitian Pager: [phone] After Hours Pager: [phone]

## 2013-01-05 NOTE — Progress Notes (Signed)
Peripherally Inserted Central Catheter/Midline Placement  The IV Nurse has discussed with the patient and/or persons authorized to consent for the patient, the purpose of this procedure and the potential benefits and risks involved with this procedure.  The benefits include less needle sticks, lab draws from the catheter and patient may be discharged home with the catheter.  Risks include, but not limited to, infection, bleeding, blood clot (thrombus formation), and puncture of an artery; nerve damage and irregular heat beat.  Alternatives to this procedure were also discussed. Verbal consent plus signed consent  PICC/Midline Placement Documentation        Mellissa Kohut 01/05/2013, 2:32 PM

## 2013-01-06 ENCOUNTER — Inpatient Hospital Stay (HOSPITAL_COMMUNITY): Payer: Medicare Other | Admitting: Registered Nurse

## 2013-01-06 ENCOUNTER — Encounter (HOSPITAL_COMMUNITY): Payer: Self-pay | Admitting: Registered Nurse

## 2013-01-06 ENCOUNTER — Encounter (HOSPITAL_COMMUNITY): Payer: Self-pay | Admitting: Anesthesiology

## 2013-01-06 ENCOUNTER — Encounter (HOSPITAL_COMMUNITY): Admission: EM | Disposition: A | Discharge status: Skilled Nursing Facility | Payer: Self-pay | Source: Home / Self Care

## 2013-01-06 DIAGNOSIS — K565 Intestinal adhesions [bands], unspecified as to partial versus complete obstruction: Secondary | ICD-10-CM

## 2013-01-06 HISTORY — PX: LAPAROSCOPIC LYSIS OF ADHESIONS: SHX5905

## 2013-01-06 HISTORY — PX: LAPAROSCOPIC ABDOMINAL EXPLORATION: SHX6249

## 2013-01-06 LAB — GLUCOSE, CAPILLARY
Glucose-Capillary: 149 mg/dL — ABNORMAL HIGH (ref 70–99)
Glucose-Capillary: 157 mg/dL — ABNORMAL HIGH (ref 70–99)
Glucose-Capillary: 166 mg/dL — ABNORMAL HIGH (ref 70–99)
Glucose-Capillary: 170 mg/dL — ABNORMAL HIGH (ref 70–99)
Glucose-Capillary: 180 mg/dL — ABNORMAL HIGH (ref 70–99)
Glucose-Capillary: 196 mg/dL — ABNORMAL HIGH (ref 70–99)

## 2013-01-06 LAB — COMPREHENSIVE METABOLIC PANEL
ALT: 7 U/L (ref 0–35)
AST: 10 U/L (ref 0–37)
Albumin: 2.7 g/dL — ABNORMAL LOW (ref 3.5–5.2)
Alkaline Phosphatase: 66 U/L (ref 39–117)
BUN: 8 mg/dL (ref 6–23)
CO2: 23 mEq/L (ref 19–32)
Calcium: 9 mg/dL (ref 8.4–10.5)
Chloride: 97 mEq/L (ref 96–112)
Creatinine, Ser: 0.48 mg/dL — ABNORMAL LOW (ref 0.50–1.10)
GFR calc Af Amer: >90 mL/min (ref >90)
GFR calc non Af Amer: >90 mL/min (ref >90)
Glucose, Bld: 182 mg/dL — ABNORMAL HIGH (ref 70–99)
Potassium: 3.7 mEq/L (ref 3.5–5.1)
Sodium: 130 mEq/L — ABNORMAL LOW (ref 135–145)
Total Bilirubin: 0.5 mg/dL (ref 0.3–1.2)
Total Protein: 6.4 g/dL (ref 6.0–8.3)

## 2013-01-06 LAB — CBC
HCT: 33 % — ABNORMAL LOW (ref 36.0–46.0)
Hemoglobin: 10.9 g/dL — ABNORMAL LOW (ref 12.0–15.0)
MCH: 28.2 pg (ref 26.0–34.0)
MCHC: 33 g/dL (ref 30.0–36.0)
MCV: 85.3 fL (ref 78.0–100.0)
Platelets: 262 10*3/uL (ref 150–400)
RBC: 3.87 MIL/uL (ref 3.87–5.11)
RDW: 14.7 % (ref 11.5–15.5)
WBC: 5.5 10*3/uL (ref 4.0–10.5)

## 2013-01-06 LAB — DIFFERENTIAL
Basophils Absolute: 0 10*3/uL (ref 0.0–0.1)
Basophils Relative: 0 % (ref 0–1)
Eosinophils Absolute: 0.1 10*3/uL (ref 0.0–0.7)
Eosinophils Relative: 2 % (ref 0–5)
Lymphocytes Relative: 17 % (ref 12–46)
Lymphs Abs: 0.9 10*3/uL (ref 0.7–4.0)
Monocytes Absolute: 1 10*3/uL (ref 0.1–1.0)
Monocytes Relative: 18 % — ABNORMAL HIGH (ref 3–12)
Neutro Abs: 3.5 10*3/uL (ref 1.7–7.7)
Neutrophils Relative %: 63 % (ref 43–77)

## 2013-01-06 LAB — PHOSPHORUS: Phosphorus: 3.4 mg/dL (ref 2.3–4.6)

## 2013-01-06 LAB — PREALBUMIN: Prealbumin: 9.7 mg/dL — ABNORMAL LOW (ref 17.0–34.0)

## 2013-01-06 LAB — SURGICAL PCR SCREEN
MRSA, PCR: NEGATIVE
Staphylococcus aureus: POSITIVE — AB

## 2013-01-06 LAB — TRIGLYCERIDES: Triglycerides: 132 mg/dL (ref <150)

## 2013-01-06 LAB — MAGNESIUM: Magnesium: 1.2 mg/dL — ABNORMAL LOW (ref 1.5–2.5)

## 2013-01-06 SURGERY — EXPLORATION, ABDOMEN, LAPAROSCOPIC
Anesthesia: General | Site: Abdomen | Wound class: Contaminated

## 2013-01-06 MED ORDER — FAT EMULSION 20 % IV EMUL
250.0000 mL | INTRAVENOUS | Status: AC
  Administered 2013-01-06: 250 mL via INTRAVENOUS
  Filled 2013-01-06: qty 250

## 2013-01-06 MED ORDER — MAGNESIUM SULFATE 40 MG/ML IJ SOLN
2.0000 g | Freq: Once | INTRAMUSCULAR | Status: AC
  Administered 2013-01-06: 2 g via INTRAVENOUS
  Filled 2013-01-06: qty 50

## 2013-01-06 MED ORDER — SODIUM CHLORIDE 0.9 % IV SOLN
INTRAVENOUS | Status: DC
  Filled 2013-01-06: qty 6

## 2013-01-06 MED ORDER — LACTATED RINGERS IR SOLN
Status: DC | PRN
  Administered 2013-01-06: 1

## 2013-01-06 MED ORDER — HEPARIN SODIUM (PORCINE) 5000 UNIT/ML IJ SOLN
5000.0000 [IU] | Freq: Three times a day (TID) | INTRAMUSCULAR | Status: DC
  Administered 2013-01-07 – 2013-01-13 (×19): 5000 [IU] via SUBCUTANEOUS
  Filled 2013-01-06 (×22): qty 1

## 2013-01-06 MED ORDER — FAT EMULSION 20 % IV EMUL
250.0000 mL | INTRAVENOUS | Status: DC
  Filled 2013-01-06: qty 250

## 2013-01-06 MED ORDER — HYDROCORTISONE SOD SUCCINATE 100 MG IJ SOLR
50.0000 mg | Freq: Every day | INTRAMUSCULAR | Status: DC
  Administered 2013-01-07 – 2013-01-13 (×7): 50 mg via INTRAVENOUS
  Filled 2013-01-06 (×8): qty 1

## 2013-01-06 MED ORDER — NEOSTIGMINE METHYLSULFATE 1 MG/ML IJ SOLN
INTRAMUSCULAR | Status: DC | PRN
  Administered 2013-01-06: 3 mg via INTRAVENOUS

## 2013-01-06 MED ORDER — DEXTROSE 5 % IV SOLN
2.0000 g | Freq: Three times a day (TID) | INTRAVENOUS | Status: AC
  Administered 2013-01-06 – 2013-01-07 (×2): 2 g via INTRAVENOUS
  Filled 2013-01-06 (×2): qty 2

## 2013-01-06 MED ORDER — PROPOFOL 10 MG/ML IV BOLUS
INTRAVENOUS | Status: DC | PRN
  Administered 2013-01-06: 120 mg via INTRAVENOUS

## 2013-01-06 MED ORDER — FENTANYL CITRATE 0.05 MG/ML IJ SOLN
INTRAMUSCULAR | Status: DC | PRN
  Administered 2013-01-06 (×2): 25 ug via INTRAVENOUS
  Administered 2013-01-06: 100 ug via INTRAVENOUS
  Administered 2013-01-06 (×4): 50 ug via INTRAVENOUS

## 2013-01-06 MED ORDER — FAT EMULSION 20 % IV EMUL
250.0000 mL | INTRAVENOUS | Status: AC
  Filled 2013-01-06: qty 250

## 2013-01-06 MED ORDER — TRACE MINERALS CR-CU-F-FE-I-MN-MO-SE-ZN IV SOLN
INTRAVENOUS | Status: AC
  Administered 2013-01-06: 19:00:00 via INTRAVENOUS
  Filled 2013-01-06 (×2): qty 2000

## 2013-01-06 MED ORDER — POTASSIUM CHLORIDE IN NACL 20-0.9 MEQ/L-% IV SOLN
INTRAVENOUS | Status: DC
  Administered 2013-01-06 – 2013-01-09 (×6): via INTRAVENOUS
  Administered 2013-01-09 – 2013-01-10 (×2): 75 mL/h via INTRAVENOUS
  Administered 2013-01-10 – 2013-01-12 (×4): via INTRAVENOUS
  Filled 2013-01-06 (×14): qty 1000

## 2013-01-06 MED ORDER — BUPIVACAINE-EPINEPHRINE PF 0.25-1:200000 % IJ SOLN
INTRAMUSCULAR | Status: DC | PRN
  Administered 2013-01-06: 20 mL

## 2013-01-06 MED ORDER — PHENYLEPHRINE HCL 10 MG/ML IJ SOLN
INTRAMUSCULAR | Status: DC | PRN
  Administered 2013-01-06: 80 ug via INTRAVENOUS
  Administered 2013-01-06: 120 ug via INTRAVENOUS

## 2013-01-06 MED ORDER — GLYCOPYRROLATE 0.2 MG/ML IJ SOLN
INTRAMUSCULAR | Status: DC | PRN
  Administered 2013-01-06: .4 mg via INTRAVENOUS

## 2013-01-06 MED ORDER — 0.9 % SODIUM CHLORIDE (POUR BTL) OPTIME
TOPICAL | Status: DC | PRN
  Administered 2013-01-06: 2000 mL

## 2013-01-06 MED ORDER — DEXTROSE 5 % IV SOLN
2.0000 g | Freq: Once | INTRAVENOUS | Status: AC
  Administered 2013-01-06: 2 g via INTRAVENOUS

## 2013-01-06 MED ORDER — HYDROMORPHONE HCL PF 1 MG/ML IJ SOLN: 0.2500 mg | INTRAMUSCULAR | Status: DC | PRN

## 2013-01-06 MED ORDER — HYDROCORTISONE SOD SUCCINATE 100 MG IJ SOLR
INTRAMUSCULAR | Status: DC | PRN
  Administered 2013-01-06: 50 mg via INTRAVENOUS

## 2013-01-06 MED ORDER — PROMETHAZINE HCL 25 MG/ML IJ SOLN: 6.2500 mg | INTRAMUSCULAR | Status: DC | PRN

## 2013-01-06 MED ORDER — TRACE MINERALS CR-CU-F-FE-I-MN-MO-SE-ZN IV SOLN
INTRAVENOUS | Status: DC
  Filled 2013-01-06: qty 2000

## 2013-01-06 MED ORDER — ONDANSETRON HCL 4 MG/2ML IJ SOLN
INTRAMUSCULAR | Status: DC | PRN
  Administered 2013-01-06 (×2): 2 mg via INTRAVENOUS

## 2013-01-06 MED ORDER — MIDAZOLAM HCL 5 MG/5ML IJ SOLN
INTRAMUSCULAR | Status: DC | PRN
  Administered 2013-01-06: 0.5 mg via INTRAVENOUS

## 2013-01-06 MED ORDER — LACTATED RINGERS IV SOLN
INTRAVENOUS | Status: DC | PRN
  Administered 2013-01-06: 13:00:00 via INTRAVENOUS

## 2013-01-06 MED ORDER — LIDOCAINE HCL (CARDIAC) 20 MG/ML IV SOLN
INTRAVENOUS | Status: DC | PRN
  Administered 2013-01-06: 70 mg via INTRAVENOUS

## 2013-01-06 MED ORDER — SUCCINYLCHOLINE CHLORIDE 20 MG/ML IJ SOLN
INTRAMUSCULAR | Status: DC | PRN
  Administered 2013-01-06: 80 mg via INTRAVENOUS

## 2013-01-06 MED ORDER — ROCURONIUM BROMIDE 100 MG/10ML IV SOLN
INTRAVENOUS | Status: DC | PRN
  Administered 2013-01-06: 10 mg via INTRAVENOUS
  Administered 2013-01-06: 20 mg via INTRAVENOUS
  Administered 2013-01-06: 10 mg via INTRAVENOUS
  Administered 2013-01-06 (×2): 5 mg via INTRAVENOUS
  Administered 2013-01-06: 10 mg via INTRAVENOUS

## 2013-01-06 MED ORDER — CLINIMIX E/DEXTROSE (5/15) 5 % IV SOLN
INTRAVENOUS | Status: AC
  Filled 2013-01-06: qty 1000

## 2013-01-06 SURGICAL SUPPLY — 81 items
APPLIER CLIP 5 13 M/L LIGAMAX5 (MISCELLANEOUS)
APPLIER CLIP ROT 10 11.4 M/L (STAPLE)
BLADE EXTENDED COATED 6.5IN (ELECTRODE) ×2 IMPLANT
BLADE HEX COATED 2.75 (ELECTRODE) ×2 IMPLANT
BLADE SURG SZ10 CARB STEEL (BLADE) ×2 IMPLANT
CABLE HIGH FREQUENCY MONO STRZ (ELECTRODE) IMPLANT
CANISTER SUCTION 2500CC (MISCELLANEOUS) ×2 IMPLANT
CATH KIT ON Q 7.5IN SLV (PAIN MANAGEMENT) IMPLANT
CELLS DAT CNTRL 66122 CELL SVR (MISCELLANEOUS) IMPLANT
CLIP APPLIE 5 13 M/L LIGAMAX5 (MISCELLANEOUS) IMPLANT
CLIP APPLIE ROT 10 11.4 M/L (STAPLE) IMPLANT
CLOTH BEACON ORANGE TIMEOUT ST (SAFETY) ×2 IMPLANT
COVER MAYO STAND STRL (DRAPES) ×4 IMPLANT
DECANTER SPIKE VIAL GLASS SM (MISCELLANEOUS) ×2 IMPLANT
DRAIN CHANNEL 19F RND (DRAIN) IMPLANT
DRAPE LAPAROSCOPIC ABDOMINAL (DRAPES) ×2 IMPLANT
DRAPE LG THREE QUARTER DISP (DRAPES) ×2 IMPLANT
DRAPE UTILITY 15X26 (DRAPE) ×2 IMPLANT
DRAPE WARM FLUID 44X44 (DRAPE) ×4 IMPLANT
DRSG TEGADERM 2-3/8X2-3/4 SM (GAUZE/BANDAGES/DRESSINGS) IMPLANT
DRSG TEGADERM 4X4.75 (GAUZE/BANDAGES/DRESSINGS) ×2 IMPLANT
DRSG XEROFORM 1X8 (GAUZE/BANDAGES/DRESSINGS) ×2 IMPLANT
ELECT REM PT RETURN 9FT ADLT (ELECTROSURGICAL) ×4
ELECTRODE REM PT RTRN 9FT ADLT (ELECTROSURGICAL) ×2 IMPLANT
GLOVE BIOGEL PI IND STRL 7.0 (GLOVE) ×1 IMPLANT
GLOVE BIOGEL PI INDICATOR 7.0 (GLOVE) ×1
GLOVE ECLIPSE 8.0 STRL XLNG CF (GLOVE) ×8 IMPLANT
GLOVE INDICATOR 8.0 STRL GRN (GLOVE) ×4 IMPLANT
GOWN STRL NON-REIN LRG LVL3 (GOWN DISPOSABLE) IMPLANT
GOWN STRL REIN XL XLG (GOWN DISPOSABLE) ×8 IMPLANT
KIT BASIN OR (CUSTOM PROCEDURE TRAY) ×2 IMPLANT
LEGGING LITHOTOMY PAIR STRL (DRAPES) IMPLANT
LIGASURE IMPACT 36 18CM CVD LR (INSTRUMENTS) IMPLANT
LUBRICANT JELLY K Y 4OZ (MISCELLANEOUS) ×2 IMPLANT
NS IRRIG 1000ML POUR BTL (IV SOLUTION) ×6 IMPLANT
PENCIL BUTTON HOLSTER BLD 10FT (ELECTRODE) ×2 IMPLANT
RTRCTR WOUND ALEXIS 18CM MED (MISCELLANEOUS)
SCISSORS LAP 5X35 DISP (ENDOMECHANICALS) ×2 IMPLANT
SEALER TISSUE G2 CVD JAW 35 (ENDOMECHANICALS) IMPLANT
SEALER TISSUE G2 CVD JAW 45CM (ENDOMECHANICALS)
SEALER TISSUE G2 STRG ARTC 35C (ENDOMECHANICALS) IMPLANT
SET IRRIG TUBING LAPAROSCOPIC (IRRIGATION / IRRIGATOR) ×2 IMPLANT
SLEEVE XCEL OPT CAN 5 100 (ENDOMECHANICALS) ×12 IMPLANT
SPONGE GAUZE 4X4 12PLY (GAUZE/BANDAGES/DRESSINGS) IMPLANT
SPONGE LAP 18X18 X RAY DECT (DISPOSABLE) ×2 IMPLANT
STAPLER VISISTAT 35W (STAPLE) IMPLANT
SUCTION POOLE TIP (SUCTIONS) ×2 IMPLANT
SUT ETHILON 2 0 PS N (SUTURE) IMPLANT
SUT PDS AB 1 CTX 36 (SUTURE) IMPLANT
SUT PDS AB 1 TP1 96 (SUTURE) IMPLANT
SUT PDS AB 2-0 CT2 27 (SUTURE) ×2 IMPLANT
SUT PDS AB 4-0 PS2 18 (SUTURE) IMPLANT
SUT PROLENE 0 CT 2 (SUTURE) IMPLANT
SUT PROLENE 2 0 CT2 30 (SUTURE) IMPLANT
SUT PROLENE 2 0 KS (SUTURE) IMPLANT
SUT SILK 2 0 (SUTURE) ×1
SUT SILK 2 0 SH (SUTURE) ×4 IMPLANT
SUT SILK 2 0 SH CR/8 (SUTURE) ×4 IMPLANT
SUT SILK 2-0 18XBRD TIE 12 (SUTURE) ×1 IMPLANT
SUT SILK 3 0 (SUTURE) ×1
SUT SILK 3 0 SH CR/8 (SUTURE) ×2 IMPLANT
SUT SILK 3-0 18XBRD TIE 12 (SUTURE) ×1 IMPLANT
SUT VIC AB 2-0 SH 18 (SUTURE) IMPLANT
SUT VICRYL 2 0 18  UND BR (SUTURE)
SUT VICRYL 2 0 18 UND BR (SUTURE) IMPLANT
SYS LAPSCP GELPORT 120MM (MISCELLANEOUS)
SYSTEM LAPSCP GELPORT 120MM (MISCELLANEOUS) IMPLANT
TAPE CLOTH SURG 4X10 WHT LF (GAUZE/BANDAGES/DRESSINGS) ×2 IMPLANT
TAPE UMBILICAL COTTON 1/8X30 (MISCELLANEOUS) ×2 IMPLANT
TOWEL OR 17X26 10 PK STRL BLUE (TOWEL DISPOSABLE) ×4 IMPLANT
TOWEL OR NON WOVEN STRL DISP B (DISPOSABLE) ×4 IMPLANT
TRAY FOLEY CATH 14FRSI W/METER (CATHETERS) ×2 IMPLANT
TRAY LAP CHOLE (CUSTOM PROCEDURE TRAY) ×2 IMPLANT
TROCAR BLADELESS OPT 5 100 (ENDOMECHANICALS) ×4 IMPLANT
TROCAR XCEL NON-BLD 11X100MML (ENDOMECHANICALS) ×2 IMPLANT
TUBING CONNECTING 10 (TUBING) ×2 IMPLANT
TUBING FILTER THERMOFLATOR (ELECTROSURGICAL) ×2 IMPLANT
TUNNELER SHEATH ON-Q 16GX12 DP (PAIN MANAGEMENT) IMPLANT
WATER STERILE IRR 1000ML POUR (IV SOLUTION) ×2 IMPLANT
YANKAUER SUCT BULB TIP 10FT TU (MISCELLANEOUS) ×2 IMPLANT
YANKAUER SUCT BULB TIP NO VENT (SUCTIONS) ×2 IMPLANT

## 2013-01-06 NOTE — Anesthesia Postprocedure Evaluation (Signed)
  Anesthesia Post-op Note  Patient: [name]  Procedure(s) Performed: Procedure(s) (LRB): LAPAROSCOPIC ABDOMINAL EXPLORATION (N/A) LAPAROSCOPIC LYSIS OF ADHESIONS/ INTEROTOMY REPAIR (N/A)  Patient Location: PACU  Anesthesia Type: General  Level of Consciousness: awake and alert   Airway and Oxygen Therapy: Patient Spontanous Breathing  Post-op Pain: mild  Post-op Assessment: Post-op Vital signs reviewed, Patient's Cardiovascular Status Stable, Respiratory Function Stable, Patent Airway and No signs of Nausea or vomiting  Last Vitals:  Filed Vitals:   01/06/13 1801  BP: 139/78  Pulse: 79  Temp: 36.5 C  Resp: 16    Post-op Vital Signs: stable   Complications: No apparent anesthesia complications

## 2013-01-06 NOTE — Progress Notes (Signed)
PARENTERAL NUTRITION CONSULT NOTE - Follow Up  Pharmacy Consult for TNA Indication: SBO  No Known Allergies  Patient Measurements: Height: 5\' 8"  (172.7 cm) Weight: 134 lb 14.7 oz (61.2 kg) IBW/kg (Calculated) : 63.9  Vital Signs: Temp: 98.5 F (36.9 C) (07/15 0540) BP: 155/88 mmHg (07/15 0540) Pulse Rate: 84 (07/15 0540) Intake/Output from previous day: 07/14 0701 - 07/15 0700 In: 1717.4 [P.O.:80; I.V.:1195.4; TPN:442] Out: 3000 [Urine:2000; Emesis/NG output:1000]  Labs:  Recent Labs  01/05/13 0453 01/06/13 0512  WBC 6.1 5.5  HGB 11.7* 10.9*  HCT 36.6 33.0*  PLT 257 262     Recent Labs  01/05/13 0453 01/05/13 0915 01/05/13 1645 01/06/13 0512  NA 134*  --  132* 130*  K 5.3*  --  4.7 3.7  CL 101  --  98 97  CO2 14*  --  16* 23  GLUCOSE 65*  --  92 182*  BUN 7  --  6 8  CREATININE 0.55  --  0.53 0.48*  CALCIUM 9.0  --  9.0 9.0  MG  --   --   --  1.2*  PHOS  --   --   --  3.4  PROT  --  7.1  --  6.4  ALBUMIN  --  3.0*  --  2.7*  AST  --  12  --  10  ALT  --  9  --  7  ALKPHOS  --  82  --  66  BILITOT  --  0.6  --  0.5  BILIDIR  --  0.2  --   --   IBILI  --  0.4  --   --   PREALBUMIN  --  10.3*  --   --   TRIG  --   --   --  132   Estimated Creatinine Clearance: 75.9 ml/min (by C-G formula based on Cr of 0.48).    Recent Labs  01/05/13 1721 01/05/13 2357 01/06/13 0619  GLUCAP 84 157* 180*    Medications:  Scheduled:  . allopurinol  50 mg Oral Daily  . amLODipine  10 mg Oral Daily  . heparin  5,000 Units Subcutaneous Q8H  . insulin aspart  0-9 Units Subcutaneous Q6H  . lip balm  1 application Topical BID  . loratadine  10 mg Oral Daily  . metoprolol  5 mg Intravenous Q6H  . pantoprazole (PROTONIX) IV  40 mg Intravenous Q12H  . predniSONE  10 mg Oral Daily   Infusions:  . 0.9 % NaCl with KCl 20 mEq / L    . Marland KitchenTPN (CLINIMIX-E) Adult 40 mL/hr at 01/05/13 1847   And  . fat emulsion 250 mL (01/05/13 1846)   PRN: albuterol, alum & mag  hydroxide-simeth, bisacodyl, cyclobenzaprine, HYDROmorphone (DILAUDID) injection, lactated ringers, magic mouthwash, metoprolol, ondansetron, sodium chloride  Insulin Requirements in the past 24 hours:  4 units  Nutritional Goals:  Per 7/14 RD note: Daily Kcal: 1700-1940, Protein: 73-86 grams, Fluid: 2.1 L  Goal formula/rate:  Clinimix-E 5/15 at 75 mL/hr plus Fat Emulsion 20% at 10 mL/hr will provide 1758 kcal/day and 90g protein/day  Current Nutrition:  NPO IVF = NS+20K at 108ml/hr  Assessment: 57 y/o F s/p emergent Hartmann's procedure 11/27/12 for diverticulitis with perforation, readmitted 7/10 with SBO that persists.  7/14 abd PICC placed and TNA started on 01/05/13.  Na low - unable to change Na-content of premixed TNA.   K wnl, but dropped significantly (4.7 --> 3.7) - monitor  Mg low (1.2) - will order 2g Mg sulfate x1 today  Triglycerides wnl  CBGs are starting to trend up - monitor SSI usage   Plan:  Tonight at 1800:  Increase Clinimix-E 5/15 to 75 mL/hr, continue fat emulsion 20% at 10 mL/hr.  Will defer maintenance IVF rate adjustments to MD (rate increased 7/15am by PA)  Magnesium sulfate 2g IV x1 today  Multivitamins and trace elements in TNA daily.  TNA labs every Monday and Thursday  Tomorrow - BMET, Mg, Phos  Watch CBGs and SSI insulin usage  Darrol Angel, PharmD Pager: [phone] 01/06/2013  8:24 AM

## 2013-01-06 NOTE — Progress Notes (Signed)
Abd soft but more distended Still w NGT output >1L & thick brown  SBO not resolved going on 5 days - rec OR exploration again.  She agrees.  Will try laparoscopically but w last surgery 5 weeks ago, ex lap very likely:  The anatomy & physiology of the digestive tract was discussed.  The pathophysiology of perforation was discussed.  Differential diagnosis such as perforated ulcer or colon, etc was discussed.   Natural history risks without surgery such as death was discussed.  I recommended abdominal exploration to diagnose & treat the source of the problem.  Laparoscopic & open techniques were discussed.   Risks such as bleeding, infection, abscess, leak, reoperation, bowel resection, possible ostomy, hernia, heart attack, death, and other risks were discussed.   The risks of no intervention will lead to serious problems including death.   I expressed a good likelihood that surgery will address the problem.    Goals of post-operative recovery were discussed as well.  We will work to minimize complications although risks in an emergent setting are high.   Questions were answered.  The patient expressed understanding & wishes to proceed with surgery.

## 2013-01-06 NOTE — Progress Notes (Signed)
Clinical Social Work Department BRIEF PSYCHOSOCIAL ASSESSMENT 01/06/2013  Patient:  [name], [name]     Account Number:  [id number]     Admit date:  12/31/2012  Clinical Social Worker:  Candie Chroman  Date/Time:  01/06/2013 10:56 AM  Referred by:  CSW  Date Referred:  01/06/2013 Referred for  SNF Placement   Other Referral:   Interview type:  Patient Other interview type:    PSYCHOSOCIAL DATA Living Status:  FAMILY Admitted from facility:   Level of care:   Primary support name:  Jacky Kindle Primary support relationship to patient:  CHILD, ADULT Degree of support available:   supportive    CURRENT CONCERNS Current Concerns  Post-Acute Placement   Other Concerns:    SOCIAL WORK ASSESSMENT / PLAN Pt is 57 yr old female recently d/c from [facility] ( 12/29/12 ) and hospitalized on 12/31/12. CSW met with pt to assist with d/c planning. PT feels ST Rehab may be needed following hospital d/c. Pt is presently receiving TPN and there is a possibility she may require surgery if her condition does not improve. Pt would like to return home on d/c but will accept rehab if needed.   Assessment/plan status:  Psychosocial Support/Ongoing Assessment of Needs Other assessment/ plan:   Home vs SNF   Information/referral to community resources:   None needed at this time.    PATIENT'S/FAMILY'S RESPONSE TO PLAN OF CARE: Pt states if she needs rehab at d/c she would like to return to [facility]. CSW has contacted SNF and response is pending.   Cori Razor LCSW [phone]

## 2013-01-06 NOTE — Op Note (Signed)
12/31/2012 - 01/06/2013  4:12 PM  PATIENT:  [name]  57 y.o. female  Patient Care Team: Nelwyn Salisbury, MD as PCP - General  PRE-OPERATIVE DIAGNOSIS:  bowel obstruction  POST-OPERATIVE DIAGNOSIS:  bowel obstruction  PROCEDURE:  Procedure(s):  LAPAROSCOPIC LYSIS OF ADHESIONS Enterotomy repair  SURGEON:  Surgeon(s): Ardeth Sportsman, MD  ASSISTANT: Aris Georgia, PA-C   ANESTHESIA:   local and general  EBL:  Total I/O In: 1000 [I.V.:1000] Out: 660 [Urine:560; Blood:100]  Delay start of Pharmacological VTE agent (>24hrs) due to surgical blood loss or risk of bleeding:  no  DRAINS: none   SPECIMEN:  No Specimen  DISPOSITION OF SPECIMEN:  N/A  COUNTS:  YES  PLAN OF CARE: Admit to inpatient   PATIENT DISPOSITION:  PACU - hemodynamically stable.  INDICATION:  Pleasant woman with perforated diverticulitis status post colectomy and ostomy.  She is now five weeks from that.  She returned with bowel obstruction.  We tried to manage it conservatively.  However it has been five days.  No improvement on films.  No flatus.  We recommended surgery for the past few days and she declined until today.  The anatomy & physiology of the digestive tract was discussed.  The pathophysiology of perforation was discussed.  Differential diagnosis such as perforated ulcer or colon, etc was discussed.   Natural history risks without surgery such as death was discussed.  I recommended abdominal exploration to diagnose & treat the source of the problem.  Laparoscopic & open techniques were discussed.   Risks such as bleeding, infection, abscess, leak, reoperation, bowel resection, possible ostomy, hernia, heart attack, death, and other risks were discussed.   The risks of no intervention will lead to serious problems including death.   I expressed a good likelihood that surgery will address the problem.    Goals of post-operative recovery were discussed as well.  We will work to minimize  complications although risks in an emergent setting are high.   Questions were answered.  The patient expressed understanding & wishes to proceed with surgery.      OR FINDINGS: She had some mild omental adhesions to her midline incision.  She had dense adhesions of jejunum to the resected sigmoid mesentery.  This is where the transition point was, at the base of the mesentery of the descending colon colostomy.  DESCRIPTION:   Informed consent was confirmed.  The patient underwent general anaesthesia without difficulty.  The patient was positioned appropriately.  VTE prevention in place.  The patient's abdomen was clipped, prepped, & draped in a sterile fashion.  Surgical timeout confirmed our plan.  The patient was positioned in reverse Trendelenburg.  Abdominal entry with a 5mm laparoscopic port was gained using optical entry technique in the right upper abdomen.  Entry was clean.  I induced carbon dioxide insufflation.  Camera inspection revealed no injury.  Extra ports were carefully placed under direct laparoscopic visualization.  I could see lesions of greater omentum only to the peritoneal side of the midline incision.  I carefully freed adhesions off using blunt hydrodissection and scissors.  Omentum only.  No hernias encountered.  I reflected the greater omentum into the upper abdomen.  She had obvious a dilated proximal small bowel.  I could find very decompressed ileum.  I could run the ileum more proximally but it seemed to dive down in the left lower quadrant.  I followed the dilated proximal jejunum and ran it more distally.  There was a  dense adhesions to the base of the colostomy at the mesentery from the former sigmoid colon.  Carefully I freed these adhesions to the retroperitoneum and mesentery.  I used some blunt hydrodissection on the wispy avascular lesions.  As you sharp scissors on the more dense adhesions to the transected mesentery.  I did encounter a 2 mm enterotomy.  I repair  this laparoscopically using interrupted 2-0 silk stitches.  I alternated from the more inferior distal side.  I have more success on the more upper part of side.  Eventually, I was able to completely mobilize The entire small bowel off the retroperitoneum.  This was the section of the transition zone.  I ran the small bowel from the ileocecal valve to the ligament of Treitz.  I reinspected the bowel, especially the jejunum oriented lysis of adhesions.  No serosal or enterotomy issues.  Bowel was viable.  I freed the omental adhesions to the colostomy mesentery.  That allowed the mesentery for better down into the lower abdomen.  I did copious irrigation.  Hemostasis was good.  I packed the carbon dioxide.  Port skin sites were closed with 4 Monocryl stitch.  Sterile dressings applied.  Granulation tissue in the midline wound covered with sterile dressing.  Colostomy appliance reapplied.  There is no family of the patient for me to talk with.  I will discuss with her when she is more awake.   Hopefully her recovery will be more smooth with a small laparoscopic incisions.

## 2013-01-06 NOTE — Transfer of Care (Signed)
Immediate Anesthesia Transfer of Care Note  Patient: [name]  Procedure(s) Performed: Procedure(s): LAPAROSCOPIC ABDOMINAL EXPLORATION (N/A) LAPAROSCOPIC LYSIS OF ADHESIONS/ INTEROTOMY REPAIR (N/A)  Patient Location: PACU  Anesthesia Type:General  Level of Consciousness: awake, alert , oriented and patient cooperative  Airway & Oxygen Therapy: Patient Spontanous Breathing and Patient connected to nasal cannula oxygen  Post-op Assessment: Report given to PACU RN and Post -op Vital signs reviewed and stable  Post vital signs: stable  Complications: No apparent anesthesia complications

## 2013-01-06 NOTE — Progress Notes (Addendum)
Clinical Social Work Department CLINICAL SOCIAL WORK PLACEMENT NOTE 01/06/2013  Patient:  [name], [name]  Account Number:  [id number] Admit date:  12/31/2012  Clinical Social Worker:  Cori Razor, LCSW  Date/time:  01/06/2013 11:17 AM  Clinical Social Work is seeking post-discharge placement for this patient at the following level of care:   SKILLED NURSING   (*CSW will update this form in Epic as items are completed)     Patient/family provided with [facility] Department of Clinical Social Work's list of facilities offering this level of care within the geographic area requested by the patient (or if unable, by the patient's family).  01/06/2013  Patient/family informed of their freedom to choose among providers that offer the needed level of care, that participate in Medicare, Medicaid or managed care program needed by the patient, have an available bed and are willing to accept the patient.    Patient/family informed of [facility]' ownership interest in [facility], as well as of the fact that they are under no obligation to receive care at this facility.  PASARR submitted to EDS on  PASARR number received from [employer] on 12/03/2012  FL2 transmitted to all facilities in geographic area requested by pt/family on  01/06/2013 FL2 transmitted to all facilities within larger geographic area on   Patient informed that his/her managed care company has contracts with or will negotiate with  certain facilities, including the following:     Patient/family informed of bed offers received:  01/06/13 Patient chooses bed at [facility] Physician recommends and patient chooses bed at    Patient to be transferred to [facility] on  01/13/13 Patient to be transferred to facility by [facility]  The following physician request were entered in Epic:   Additional Comments:  Cori Razor LCSW [phone]

## 2013-01-06 NOTE — Anesthesia Preprocedure Evaluation (Addendum)
Anesthesia Evaluation  Patient identified by MRN, date of birth, ID band Patient awake  General Assessment Comment:.  Legally blind         since pt was a teenager   .  Hearing aid worn         pt wears bilateral hearing aids   .  Marfan's syndrome affecting skin         with scolosis   .  Osteoporosis     .  Hypertension     .  Substance abuse         recovering alcoholic x12 years    .  Hernia  4/08   .  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  Chronic gout  2008   .  Stroke  2/08   .  Fibroid     .  Status post bunionectomy  1/10       bilateral      Reviewed: Allergy & Precautions, H&P , NPO status , Patient's Chart, lab work & pertinent test results  Airway Mallampati: II TM Distance: >3 FB Neck ROM: Full    Dental no notable dental hx.    Pulmonary neg pulmonary ROS,  breath sounds clear to auscultation  Pulmonary exam normal       Cardiovascular Exercise Tolerance: Good hypertension, Pt. on medications and Pt. on home beta blockers Rhythm:Regular Rate:Normal     Neuro/Psych CVA negative psych ROS   GI/Hepatic Neg liver ROS, GERD-  Medicated,  Endo/Other  negative endocrine ROS  Renal/GU negative Renal ROS  negative genitourinary   Musculoskeletal  (+) Arthritis -, Rheumatoid disorders,    Abdominal   Peds negative pediatric ROS (+)  Hematology negative hematology ROS (+)   Anesthesia Other Findings   Reproductive/Obstetrics negative OB ROS                        Anesthesia Physical Anesthesia Plan  ASA: III and emergent  Anesthesia Plan: General   Post-op Pain Management:    Induction: Intravenous  Airway Management Planned: Oral ETT  Additional Equipment:   Intra-op Plan:   Post-operative Plan: Extubation in OR  Informed Consent: I have reviewed the patients History and Physical, chart, labs and discussed the procedure including the risks,  benefits and alternatives for the proposed anesthesia with the patient or authorized representative who has indicated his/her understanding and acceptance.   Dental advisory given  Plan Discussed with: CRNA  Anesthesia Plan Comments:         Anesthesia Quick Evaluation

## 2013-01-06 NOTE — Preoperative (Signed)
Beta Blockers   Reason not to administer Beta Blockers:lopressor 5 mg at 1117 01-06-13

## 2013-01-06 NOTE — Progress Notes (Signed)
Subjective: Pt feels about the same.  She doesn't have any pain currently, but c/o intermittent abdominal pain and nausea.  She noticed a small amount of hard stool in her ostomy bag.  She's having feculent NG output.  Urinating normally, but no stool up until late yesterday.    Objective: Vital signs in last 24 hours: Temp:  [98.5 F (36.9 C)-99.2 F (37.3 C)] 98.5 F (36.9 C) (07/15 0540) Pulse Rate:  [84-102] 84 (07/15 0540) Resp:  [18] 18 (07/15 0540) BP: (135-155)/(80-93) 155/88 mmHg (07/15 0540) SpO2:  [98 %] 98 % (07/15 0540) Last BM Date: 01/04/13  Intake/Output from previous day: 2023-01-19 0701 - 07/15 0700 In: 1717.4 [P.O.:80; I.V.:1195.4; TPN:442] Out: 3000 [Urine:2000; Emesis/NG output:1000] Intake/Output this shift:    PE: Gen:  Alert, NAD, pleasant Abd: Soft, NT, mild distension, +BS, no HSM, abdominal wounds C/D, healing well    Lab Results:   Recent Labs  Jan 18, 2013 0453 01/06/13 0512  WBC 6.1 5.5  HGB 11.7* 10.9*  HCT 36.6 33.0*  PLT 257 262   BMET  Recent Labs  01-18-2013 1645 01/06/13 0512  NA 132* 130*  K 4.7 3.7  CL 98 97  CO2 16* 23  GLUCOSE 92 182*  BUN 6 8  CREATININE 0.53 0.48*  CALCIUM 9.0 9.0   PT/INR No results found for this basename: LABPROT, INR,  in the last 72 hours CMP     Component Value Date/Time   NA 130* 01/06/2013 0512   K 3.7 01/06/2013 0512   CL 97 01/06/2013 0512   CO2 23 01/06/2013 0512   GLUCOSE 182* 01/06/2013 0512   BUN 8 01/06/2013 0512   CREATININE 0.48* 01/06/2013 0512   CALCIUM 9.0 01/06/2013 0512   PROT 6.4 01/06/2013 0512   ALBUMIN 2.7* 01/06/2013 0512   AST 10 01/06/2013 0512   ALT 7 01/06/2013 0512   ALKPHOS 66 01/06/2013 0512   BILITOT 0.5 01/06/2013 0512   GFRNONAA >90 01/06/2013 0512   GFRAA >90 01/06/2013 0512   Lipase     Component Value Date/Time   LIPASE 31 11/26/2012 2330       Studies/Results: Dg Abd 2 Views  2013/01/18   *RADIOLOGY REPORT*  Clinical Data: Small bowel obstruction.  ABDOMEN -  2 VIEW  Comparison: Two-view abdomen 01/04/2013.  Findings: Dilated loops of small bowel and fluid levels are not significantly changed.  No free air is evident.  Scoliosis is stable. An NG tube is stable in the stomach.  IMPRESSION:  1.  Stable appearance of a small bowel obstruction. 2.  No free air.   Original Report Authenticated By: Marin Roberts, M.D.   Dg Abd 2 Views  01/04/2013   *RADIOLOGY REPORT*  Clinical Data: Small bowel obstruction.  Abdominal pain and distention.  ABDOMEN - 2 VIEW  Comparison: 01/02/2013  Findings: Multiple dilated small bowel loops are again seen, without significant change.  Ostomy ring again seen in the left abdomen.  Nasogastric tube remains in appropriate position within the stomach.  IMPRESSION: Stable small bowel dilatation, with nasogastric tube in appropriate position.   Original Report Authenticated By: Myles Rosenthal, M.D.    Anti-infectives: Anti-infectives   None       Assessment/Plan SBO  1.  S/p Exploratory laparotomy with sigmoid colectomy and diverting end colostomy with biopsy of peritoneal nodule. 11/27/12 Dr. Biagio Quint after focally perforated diverticulitis  2. Marfan's  3. RA on steroids  4. HTN  5. Legally blind  6. HOH  7.  Protein calorie  malnutrition/TPN  Plan: 1.  NPO, Really not improving much (small amount of hard stool in bag, still having intermittent abdominal pain and nausea 2.  NG output down to 1000 from 1500 yesterday, but its still feculent  3.  Change IVF to NS with K+ since sodium level is low, will monitor to ensure it doesn't go high again, continue TPN since NPO 4.  SCD's and heparin 5.  Ambulate and IS 6.  The patient is willing to have surgery if it is necessary.  We discussed the possibility of starting laparoscopically first if possible to prevent a big incision.  The patient is realistic and understands she may need to proceed to surgery today or tomorrow.     LOS: 6 days    DORT, Aundra Millet 01/06/2013,  8:07 AM Pager: [phone]

## 2013-01-06 NOTE — Progress Notes (Signed)
Discussed in long length of stay rounds. 

## 2013-01-06 NOTE — Care Management Note (Addendum)
    Page 1 of 1   01/08/2013     12:44:08 PM   CARE MANAGEMENT NOTE 01/08/2013  Patient:  [name], [name]   Account Number:  [id number]  Date Initiated:  01/01/2013  Documentation initiated by:  Lorenda Ishihara  Subjective/Objective Assessment:   57 yo female admitted with SBO. PTA lived at home with dtr.     Action/Plan:   Recently in SNF s/p bowel surgery   Anticipated DC Date:  01/12/2013   Anticipated DC Plan:  SKILLED NURSING FACILITY  In-house referral  Clinical Social Worker      DC Planning Services  CM consult      Choice offered to / List presented to:             Status of service:  In process, will continue to follow Medicare Important Message given?   (If response is "NO", the following Medicare IM given date fields will be blank) Date Medicare IM given:   Date Additional Medicare IM given:    Discharge Disposition:    Per UR Regulation:  Reviewed for med. necessity/level of care/duration of stay  If discussed at Long Length of Stay Meetings, dates discussed:   01/06/2013  01/08/2013    Comments:  01/08/13 Christina Stehr RN,BSN NCM [phone] POD#2 EXP LAP,LOA.NGT,TPN,NPO,IV PROTONIX,IVF,IV SOLUMEDROL,AWAIT BOWEL FXN.PT-SNF.  01/06/2013 [name] BSN RN CCM [phone] Pt to transfer to 5w after surgery.

## 2013-01-06 NOTE — Evaluation (Signed)
Occupational Therapy Evaluation Patient Details Name: [name] MRN: [id number] DOB: 02/01/1956 Today's Date: 01/06/2013 Time: 4098-1191 OT Time Calculation (min): 25 min  OT Assessment / Plan / Recommendation History of present illness 57 y.o. female with h/o Marfan's  syndrome and RA who is legally blind.  She underwent a emergent Hartman's procedure for perforated diverticulitis ~1 mo ago.  Since then she has been at a rehab facility, but was released on 12/29/12.  She has not had an appetite since she got home.   Pt admitted with SBO.     Clinical Impression   Pt was admitted with SBO.  She was recently in rehab after sx for diverticulitis.  She is appropriate for skilled OT and goals in acute are for min guard to min A for adls.      OT Assessment  Patient needs continued OT Services    Follow Up Recommendations  SNF    Barriers to Discharge Decreased caregiver support    Equipment Recommendations  None recommended by OT    Recommendations for Other Services    Frequency  Min 2X/week    Precautions / Restrictions Precautions Precautions: Fall Restrictions Weight Bearing Restrictions: No   Pertinent Vitals/Pain Pt with NG tube.  Uncomfortable to bend forward.     ADL  Grooming: Wash/dry hands;Set up Where Assessed - Grooming: Unsupported sitting Upper Body Bathing: Minimal assistance Where Assessed - Upper Body Bathing: Unsupported sitting Lower Body Bathing: Moderate assistance Where Assessed - Lower Body Bathing: Supported sit to stand Where Assessed - Upper Body Dressing: Unsupported sitting Lower Body Dressing: Maximal assistance Where Assessed - Lower Body Dressing: Supported sit to stand Toilet Transfer: Minimal assistance Toilet Transfer Method: Surveyor, minerals: Materials engineer and Hygiene: Set up Where Assessed - Engineer, mining and Hygiene: Sit on 3-in-1 or toilet Equipment Used:  Rolling walker Transfers/Ambulation Related to ADLs: pt did not wish to sit up in chair after using commode ADL Comments: pt states she normally bends over to put shoes on (velcro closures). Couldn't tolerate today.  Pt has NG tube   OT Diagnosis: Generalized weakness  OT Problem List: Decreased strength;Decreased activity tolerance;Impaired balance (sitting and/or standing);Impaired vision/perception OT Treatment Interventions: Self-care/ADL training;Therapeutic exercise;Therapeutic activities;Patient/family education;Balance training;DME and/or AE instruction   OT Goals(Current goals can be found in the care plan section) Acute Rehab OT Goals Patient Stated Goal: to be able to walk OT Goal Formulation: With patient Time For Goal Achievement: 01/20/13 Potential to Achieve Goals: Good  Visit Information  Assistance Needed: +1 History of Present Illness: 57 y.o. female with h/o Marfan's  syndrome and RA who is legally blind.  She underwent a emergent Hartman's procedure for perforated diverticulitis ~1 mo ago.  Since then she has been at a rehab facility, but was released on 12/29/12.  She has not had an appetite since she got home.   Pt admitted with SBO.         Prior Functioning     Home Living Family/patient expects to be discharged to:: Other (Comment) (pt willing to go to rehab if needed) Prior Function Level of Independence: Needs assistance Gait / Transfers Assistance Needed: walked with RW independently per pt ADL's / Homemaking Assistance Needed: assist required Comments: pt was able to do basic adls Communication Communication: HOH         Vision/Perception Vision - History Baseline Vision: Legally blind (blind)   Cognition  Cognition Arousal/Alertness: Awake/alert Behavior During Therapy: [facility]  for tasks assessed/performed Overall Cognitive Status: Within Functional Limits for tasks assessed    Extremity/Trunk Assessment Upper Extremity Assessment RUE Deficits  / Details: bil hands with RA deformities.  Able to bil UEs to 80 degrees RUE Coordination:  (pt currently npo. normally holds utensils) Cervical / Trunk Assessment Cervical / Trunk Assessment: Other exceptions (scoliosis)     Mobility Bed Mobility Supine to Sit: 4: Min assist;HOB flat Transfers Sit to Stand: 4: Min assist;From bed;From elevated surface Stand to Sit: 4: Min assist;To bed;To chair/3-in-1 Details for Transfer Assistance: min A to guide     Exercise     Balance     End of Session OT - End of Session Activity Tolerance: Patient tolerated treatment well Patient left: in bed;with call bell/phone within reach  GO     Harshini Trent 01/06/2013, 2:50 PM Marica Otter, OTR/L [phone] 01/06/2013

## 2013-01-07 ENCOUNTER — Encounter (HOSPITAL_COMMUNITY): Payer: Self-pay | Admitting: Surgery

## 2013-01-07 DIAGNOSIS — E46 Unspecified protein-calorie malnutrition: Secondary | ICD-10-CM

## 2013-01-07 LAB — GLUCOSE, CAPILLARY
Glucose-Capillary: 167 mg/dL — ABNORMAL HIGH (ref 70–99)
Glucose-Capillary: 132 mg/dL — ABNORMAL HIGH (ref 70–99)
Glucose-Capillary: 156 mg/dL — ABNORMAL HIGH (ref 70–99)

## 2013-01-07 LAB — BASIC METABOLIC PANEL
BUN: 26 mg/dL — ABNORMAL HIGH (ref 6–23)
CO2: 23 mEq/L (ref 19–32)
Calcium: 8.3 mg/dL — ABNORMAL LOW (ref 8.4–10.5)
Chloride: 97 mEq/L (ref 96–112)
Creatinine, Ser: 0.85 mg/dL (ref 0.50–1.10)
GFR calc Af Amer: 87 mL/min — ABNORMAL LOW (ref >90)
GFR calc non Af Amer: 75 mL/min — ABNORMAL LOW (ref >90)
Glucose, Bld: 130 mg/dL — ABNORMAL HIGH (ref 70–99)
Potassium: 4 mEq/L (ref 3.5–5.1)
Sodium: 131 mEq/L — ABNORMAL LOW (ref 135–145)

## 2013-01-07 LAB — PHOSPHORUS: Phosphorus: 4.6 mg/dL (ref 2.3–4.6)

## 2013-01-07 LAB — MAGNESIUM: Magnesium: 1.6 mg/dL (ref 1.5–2.5)

## 2013-01-07 MED ORDER — FAT EMULSION 20 % IV EMUL
250.0000 mL | INTRAVENOUS | Status: AC
  Administered 2013-01-07: 250 mL via INTRAVENOUS
  Filled 2013-01-07: qty 250

## 2013-01-07 MED ORDER — TRACE MINERALS CR-CU-F-FE-I-MN-MO-SE-ZN IV SOLN
INTRAVENOUS | Status: AC
  Administered 2013-01-07: 18:00:00 via INTRAVENOUS
  Filled 2013-01-07: qty 2000

## 2013-01-07 MED ORDER — HYDROMORPHONE HCL PF 1 MG/ML IJ SOLN
0.5000 mg | INTRAMUSCULAR | Status: DC | PRN
  Administered 2013-01-07 – 2013-01-13 (×14): 1 mg via INTRAVENOUS
  Filled 2013-01-07 (×14): qty 1

## 2013-01-07 NOTE — Progress Notes (Signed)
PARENTERAL NUTRITION CONSULT NOTE - Follow Up  Pharmacy Consult for TNA Indication: SBO  No Known Allergies  Patient Measurements: Height: 5\' 8"  (172.7 cm) Weight: 134 lb 14.7 oz (61.2 kg) IBW/kg (Calculated) : 63.9  Vital Signs: Temp: 98.7 F (37.1 C) (07/16 0616) Temp src: Oral (07/16 0616) BP: 101/66 mmHg (07/16 0616) Pulse Rate: 92 (07/16 0616) Intake/Output from previous day: 07/15 0701 - 07/16 0700 In: 4197.8 [I.V.:3478.8; TPN:719] Out: 2410 [Urine:1110; Emesis/NG output:1100; Blood:100]  Labs:  Recent Labs  01/05/13 0453 01/06/13 0512  WBC 6.1 5.5  HGB 11.7* 10.9*  HCT 36.6 33.0*  PLT 257 262     Recent Labs  01/05/13 0915 01/05/13 1645 01/06/13 0512 01/07/13 0518  NA  --  132* 130* 131*  K  --  4.7 3.7 4.0  CL  --  98 97 97  CO2  --  16* 23 23  GLUCOSE  --  92 182* 130*  BUN  --  6 8 26*  CREATININE  --  0.53 0.48* 0.85  CALCIUM  --  9.0 9.0 8.3*  MG  --   --  1.2* 1.6  PHOS  --   --  3.4 4.6  PROT 7.1  --  6.4  --   ALBUMIN 3.0*  --  2.7*  --   AST 12  --  10  --   ALT 9  --  7  --   ALKPHOS 82  --  66  --   BILITOT 0.6  --  0.5  --   BILIDIR 0.2  --   --   --   IBILI 0.4  --   --   --   PREALBUMIN 10.3*  --  9.7*  --   TRIG  --   --  132  --   7/16:  Corrected Ca = 9.3 Estimated Creatinine Clearance: 71.4 ml/min (by C-G formula based on Cr of 0.85).    Recent Labs  01/06/13 1713 01/06/13 1847 01/06/13 2356  GLUCAP 170* 196* 166*    Medications:  Scheduled:  . heparin  5,000 Units Subcutaneous Q8H  . hydrocortisone sod succinate (SOLU-CORTEF) inj  50 mg Intravenous Daily  . insulin aspart  0-9 Units Subcutaneous Q6H  . lip balm  1 application Topical BID  . loratadine  10 mg Oral Daily  . metoprolol  5 mg Intravenous Q6H  . pantoprazole (PROTONIX) IV  40 mg Intravenous Q12H   Infusions:  . 0.9 % NaCl with KCl 20 mEq / L 75 mL/hr at 01/06/13 2232  . Marland KitchenTPN (CLINIMIX-E) Adult 75 mL/hr at 01/06/13 1835   And  . fat emulsion  250 mL (01/06/13 1836)   PRN: albuterol, alum & mag hydroxide-simeth, bisacodyl, HYDROmorphone (DILAUDID) injection, lactated ringers, magic mouthwash, metoprolol, ondansetron, sodium chloride  Insulin Requirements in the past 24 hours:  5 units  Nutritional Goals:  Per 7/14 RD note: Daily Kcal: 1700-1940, Protein: 73-86 grams, Fluid: 2.1 L  Goal formula/rate:  Clinimix-E 5/15 at 75 mL/hr plus Fat Emulsion 20% at 10 mL/hr will provide 1758 kcal/day and 90g protein/day  Current Nutrition:  NPO IVF = NS+20K at 76ml/hr  Assessment: 57 y/o F s/p emergent Hartmann's procedure 11/27/12 for diverticulitis with perforation, readmitted 7/10 with SBO that persists.  7/14 abd PICC placed and TNA started on 01/05/13. Patient is now s/p laproscopic LOA and enterotomy repair on 7/15 pm.  Na low - unable to change Na-content of premixed TNA. Has NS+20K at 36ml/hr.  K  wn  Mg now back to wnl after receiving 2g Mag sulfate yesterday  Phos on the upper end of normal limit - will recheck tomorrow  Triglycerides wnl  Prealbumin 9/7 on 7/15  CBGs elevated above goal of <150, but cycling up and down - monitor SSI usage and CBG trend  Plan:  Tonight at 1800:  Continue Clinimix-E 5/15 to 75 mL/hr, continue fat emulsion 20% at 10 mL/hr.  Will defer maintenance IVF rate adjustments to MD (rate increased 7/15am by PA)  Multivitamins and trace elements in TNA daily.  TNA labs every Monday and Thursday  Watch CBGs and SSI insulin usage  Darrol Angel, PharmD Pager: [phone] 01/07/2013  7:06 AM

## 2013-01-07 NOTE — Evaluation (Addendum)
Physical Therapy Re-Evaluation Patient Details Name: [name] MRN: [id number] DOB: [birth year] Today's Date: 01/07/2013 Time: 4098-1191 PT Time Calculation (min): 31 min  PT Assessment / Plan / Recommendation History of Present Illness  Pt underewent laporoscopic lysis of adhesions for SBO 7/ 15/14  Clinical Impression  Pt with abdominal pain and general weakness, but pt is able to participate with PT and begin ambulation with RW.  Anticipate she will benefit from continued PT at SNF to regain optimal functional independence    PT Assessment  Patient needs continued PT services    Follow Up Recommendations  SNF    Does the patient have the potential to tolerate intense rehabilitation    no  Barriers to Discharge        Equipment Recommendations  None recommended by PT    Recommendations for Other Services     Frequency Min 3X/week    Precautions / Restrictions Precautions Precaution Comments: Pt is blind, NG tube,  ostomy, multiple skeletal deformities  Restrictions Weight Bearing Restrictions: No   Pertinent Vitals/Pain Pt c/o abdominal pain      Mobility  Bed Mobility Bed Mobility: Rolling Right;Rolling Left Rolling Right: 4: Min guard Rolling Left: 4: Min guard Supine to Sit: 4: Min assist;HOB elevated Details for Bed Mobility Assistance: pt needs cues and occasional hand over hand assist Transfers Transfers: Sit to Stand;Stand to Sit Sit to Stand: From bed;From elevated surface;3: Mod assist Stand to Sit: To bed;To chair/3-in-1;3: Mod assist Details for Transfer Assistance: mod assist for stability at pelvis Ambulation/Gait Ambulation/Gait Assistance: 3: Mod assist Ambulation Distance (Feet):  (12) Assistive device: Rolling walker (pt wears her special shoes for support) Ambulation/Gait Assistance Details: pt needed support at pelvis for stability.  She was able to weight shift and progress feet Gait Pattern: Step-through pattern;Trunk  flexed;Decreased step length - right;Decreased step length - left Gait velocity: decreased General Gait Details: Pt uses RW for support , but needs extra staiblity due to generalized weakness Stairs: No Wheelchair Mobility Wheelchair Mobility: No    Exercises     PT Diagnosis: Generalized weakness;Difficulty walking;Acute pain  PT Problem List: Decreased activity tolerance;Decreased mobility;Decreased strength;Other (comment) (blindness) PT Treatment Interventions: Gait training;DME instruction;Functional mobility training;Therapeutic activities;Therapeutic exercise;Patient/family education     PT Goals(Current goals can be found in the care plan section) Acute Rehab PT Goals Patient Stated Goal: to be able to walk PT Goal Formulation: With patient Time For Goal Achievement: 01/21/13 Potential to Achieve Goals: Good  Visit Information  Last PT Received On: 01/07/13 Assistance Needed: +2 (safety) History of Present Illness: Pt underewent laporoscopic lysis of adhesions for SBO 7/ 15/14       Prior Functioning       Cognition  Cognition Arousal/Alertness: Awake/alert Behavior During Therapy: WFL for tasks assessed/performed Overall Cognitive Status: Within Functional Limits for tasks assessed    Extremity/Trunk Assessment Upper Extremity Assessment RUE Deficits / Details: B hands have RA deformities Lower Extremity Assessment RLE Deficits / Details: knee ext +3/5, ankle externally rotated, able to actively DF/PF, Hip Abd/add AAROM WFL LLE Deficits / Details: knee ext +3/5, ankle externally rotated, able to actively DF/PF, Hip Abd/add AAROM [facility] Cervical / Trunk Assessment Cervical / Trunk Assessment: Other exceptions Cervical / Trunk Exceptions: scoliosis   Balance Static Sitting Balance Static Sitting - Balance Support: Bilateral upper extremity supported Static Sitting - Level of Assistance: 5: Stand by assistance Static Standing Balance Static Standing - Balance  Support: Bilateral upper extremity supported Static Standing -  Level of Assistance: 4: Min assist  End of Session PT - End of Session Activity Tolerance: Patient tolerated treatment well Patient left: in chair;with call bell/phone within reach;with nursing/sitter in room  GP   Christina Bath K. [place], [place] [phone] 01/07/2013, 1:48 PM

## 2013-01-07 NOTE — Progress Notes (Signed)
See PA's note PAin controlled NGT still w thick dark output Dressings c/di  S/p lap LOA for SBO  -cont NGT -IVF -TNA -await return of bowel function

## 2013-01-07 NOTE — Progress Notes (Signed)
Patient ID: [name], female   DOB: March 20, 1956, 57 y.o.   MRN: [id number] 1 Day Post-Op  Subjective: In pain but not severe, wanting pain meds right now, denies n/v  Objective: Vital signs in last 24 hours: Temp:  [97.5 F (36.4 C)-99 F (37.2 C)] 98.7 F (37.1 C) (07/16 0616) Pulse Rate:  [79-105] 92 (07/16 0616) Resp:  [12-20] 16 (07/16 0616) BP: (93-160)/(62-134) 101/66 mmHg (07/16 0616) SpO2:  [97 %-100 %] 99 % (07/16 0616) Last BM Date: 01/04/13  Intake/Output from previous day: 07/15 0701 - 07/16 0700 In: 4197.8 [I.V.:3478.8; TPN:719] Out: 2410 [Urine:1110; Emesis/NG output:1100; Blood:100] Intake/Output this shift:    PE: Gen:  Alert, NAD, pleasant Abd: mildly distended, no bs, abdominal wounds C/D, lap site dressings dry    Lab Results:   Recent Labs  01/05/13 0453 01/06/13 0512  WBC 6.1 5.5  HGB 11.7* 10.9*  HCT 36.6 33.0*  PLT 257 262   BMET  Recent Labs  01/06/13 0512 01/07/13 0518  NA 130* 131*  K 3.7 4.0  CL 97 97  CO2 23 23  GLUCOSE 182* 130*  BUN 8 26*  CREATININE 0.48* 0.85  CALCIUM 9.0 8.3*   PT/INR No results found for this basename: LABPROT, INR,  in the last 72 hours CMP     Component Value Date/Time   NA 131* 01/07/2013 0518   K 4.0 01/07/2013 0518   CL 97 01/07/2013 0518   CO2 23 01/07/2013 0518   GLUCOSE 130* 01/07/2013 0518   BUN 26* 01/07/2013 0518   CREATININE 0.85 01/07/2013 0518   CALCIUM 8.3* 01/07/2013 0518   PROT 6.4 01/06/2013 0512   ALBUMIN 2.7* 01/06/2013 0512   AST 10 01/06/2013 0512   ALT 7 01/06/2013 0512   ALKPHOS 66 01/06/2013 0512   BILITOT 0.5 01/06/2013 0512   GFRNONAA 75* 01/07/2013 0518   GFRAA 87* 01/07/2013 0518   Lipase     Component Value Date/Time   LIPASE 31 11/26/2012 2330       Studies/Results: No results found.  Anti-infectives: Anti-infectives   Start     Dose/Rate Route Frequency Ordered Stop   01/06/13 2200  cefOXitin (MEFOXIN) 2 g in dextrose 5 % 50 mL IVPB     2 g Intravenous 3  times per day 01/06/13 1900 01/07/13 0532   01/06/13 1345  cefOXitin (MEFOXIN) 2 g in dextrose 5 % 50 mL IVPB     2 g Intravenous  Once 01/06/13 1343 01/06/13 1340   01/06/13 1245  [MAR Hold]  clindamycin (CLEOCIN) 900 mg, gentamicin (GARAMYCIN) 240 mg in sodium chloride 0.9 % 1,000 mL for intraperitoneal lavage  Status:  Discontinued     (On MAR Hold since 01/06/13 1249)    Intraperitoneal To Surgery 01/06/13 1236 01/06/13 1811       Assessment/Plan SBO  1.  S/p Exploratory laparotomy with sigmoid colectomy and diverting end colostomy with biopsy of peritoneal nodule. 11/27/12 Dr. Biagio Quint after focally perforated diverticulitis  2. Marfan's  3. RA on steroids  4. HTN  5. Legally blind  6. HOH  7.  Protein calorie malnutrition/TPN  Plan: 1.  NPO, NGT, await output from ostomy 2.  continue TPN since NPO 3.  SCD's and heparin 4.  Ambulate and IS 5.  Will increase dilaudid to .5-1mg  q2hrs prn pain    LOS: 7 days    WHITE, ELIZABETH 01/07/2013, 7:51 AM

## 2013-01-08 ENCOUNTER — Ambulatory Visit: Payer: Medicare Other | Admitting: Family Medicine

## 2013-01-08 LAB — GLUCOSE, CAPILLARY
Glucose-Capillary: 115 mg/dL — ABNORMAL HIGH (ref 70–99)
Glucose-Capillary: 116 mg/dL — ABNORMAL HIGH (ref 70–99)
Glucose-Capillary: 134 mg/dL — ABNORMAL HIGH (ref 70–99)
Glucose-Capillary: 142 mg/dL — ABNORMAL HIGH (ref 70–99)

## 2013-01-08 LAB — COMPREHENSIVE METABOLIC PANEL
ALT: 8 U/L (ref 0–35)
AST: 11 U/L (ref 0–37)
Albumin: 2 g/dL — ABNORMAL LOW (ref 3.5–5.2)
Alkaline Phosphatase: 62 U/L (ref 39–117)
BUN: 27 mg/dL — ABNORMAL HIGH (ref 6–23)
CO2: 24 mEq/L (ref 19–32)
Calcium: 8.3 mg/dL — ABNORMAL LOW (ref 8.4–10.5)
Chloride: 102 mEq/L (ref 96–112)
Creatinine, Ser: 0.6 mg/dL (ref 0.50–1.10)
GFR calc Af Amer: >90 mL/min (ref >90)
GFR calc non Af Amer: >90 mL/min (ref >90)
Glucose, Bld: 123 mg/dL — ABNORMAL HIGH (ref 70–99)
Potassium: 3.6 mEq/L (ref 3.5–5.1)
Sodium: 133 mEq/L — ABNORMAL LOW (ref 135–145)
Total Bilirubin: 0.3 mg/dL (ref 0.3–1.2)
Total Protein: 5.1 g/dL — ABNORMAL LOW (ref 6.0–8.3)

## 2013-01-08 LAB — MAGNESIUM: Magnesium: 1.6 mg/dL (ref 1.5–2.5)

## 2013-01-08 LAB — PHOSPHORUS: Phosphorus: 3.4 mg/dL (ref 2.3–4.6)

## 2013-01-08 MED ORDER — TRACE MINERALS CR-CU-F-FE-I-MN-MO-SE-ZN IV SOLN
INTRAVENOUS | Status: AC
  Administered 2013-01-08: 17:00:00 via INTRAVENOUS
  Filled 2013-01-08: qty 2000

## 2013-01-08 MED ORDER — FAT EMULSION 20 % IV EMUL
250.0000 mL | INTRAVENOUS | Status: AC
  Administered 2013-01-08: 250 mL via INTRAVENOUS
  Filled 2013-01-08: qty 250

## 2013-01-08 MED ORDER — INSULIN ASPART 100 UNIT/ML ~~LOC~~ SOLN: 0.0000 [IU] | Freq: Three times a day (TID) | SUBCUTANEOUS | Status: DC

## 2013-01-08 NOTE — Progress Notes (Signed)
2 Days Post-Op  Subjective: Pt's pain is better controlled today.  No N/V.  NG in place and output decreasing from 1100 to 900 in the last 24 hours.  Urinating well, no gas or BM from bag yet.  Working with PT/OT, hopefully they will see her again today.  Objective: Vital signs in last 24 hours: Temp:  [98.1 F (36.7 C)-99.3 F (37.4 C)] 98.1 F (36.7 C) (07/17 0610) Pulse Rate:  [66-84] 79 (07/17 0610) Resp:  [16] 16 (07/17 0610) BP: (88-114)/(53-75) 114/75 mmHg (07/17 0610) SpO2:  [97 %-99 %] 97 % (07/17 0610) Last BM Date: 01/04/13  Intake/Output from previous day: 07/16 0701 - 07/17 0700 In: 1781.3 [I.V.:1781.3] Out: 2840 [Urine:1940; Emesis/NG output:900] Intake/Output this shift:    PE: Gen:  Alert, NAD, pleasant Abd: Soft, NT/ND, few BS, no HSM, incisions C/D/I, ostomy pink with no flatus or BM in bag  Lab Results:   Recent Labs  01/06/13 0512  WBC 5.5  HGB 10.9*  HCT 33.0*  PLT 262   BMET  Recent Labs  01/07/13 0518 01/08/13 0510  NA 131* 133*  K 4.0 3.6  CL 97 102  CO2 23 24  GLUCOSE 130* 123*  BUN 26* 27*  CREATININE 0.85 0.60  CALCIUM 8.3* 8.3*   PT/INR No results found for this basename: LABPROT, INR,  in the last 72 hours CMP     Component Value Date/Time   NA 133* 01/08/2013 0510   K 3.6 01/08/2013 0510   CL 102 01/08/2013 0510   CO2 24 01/08/2013 0510   GLUCOSE 123* 01/08/2013 0510   BUN 27* 01/08/2013 0510   CREATININE 0.60 01/08/2013 0510   CALCIUM 8.3* 01/08/2013 0510   PROT 5.1* 01/08/2013 0510   ALBUMIN 2.0* 01/08/2013 0510   AST 11 01/08/2013 0510   ALT 8 01/08/2013 0510   ALKPHOS 62 01/08/2013 0510   BILITOT 0.3 01/08/2013 0510   GFRNONAA >90 01/08/2013 0510   GFRAA >90 01/08/2013 0510   Lipase     Component Value Date/Time   LIPASE 31 11/26/2012 2330       Studies/Results: No results found.  Anti-infectives: Anti-infectives   Start     Dose/Rate Route Frequency Ordered Stop   01/06/13 2200  cefOXitin (MEFOXIN) 2 g in  dextrose 5 % 50 mL IVPB     2 g Intravenous 3 times per day 01/06/13 1900 01/07/13 0532   01/06/13 1345  cefOXitin (MEFOXIN) 2 g in dextrose 5 % 50 mL IVPB     2 g Intravenous  Once 01/06/13 1343 01/06/13 1340   01/06/13 1245  [MAR Hold]  clindamycin (CLEOCIN) 900 mg, gentamicin (GARAMYCIN) 240 mg in sodium chloride 0.9 % 1,000 mL for intraperitoneal lavage  Status:  Discontinued     (On MAR Hold since 01/06/13 1249)    Intraperitoneal To Surgery 01/06/13 1236 01/06/13 1811       Assessment/Plan 1.  SBO S/p Exploratory laparotomy with sigmoid colectomy and diverting end colostomy with biopsy of peritoneal nodule. 11/27/12 Dr. Biagio Quint after focally perforated diverticulitis.  Now POD #2 s/p LOA for SBO 2. Marfan's  3. RA on steroids  4. HTN  5. Legally blind  6. HOH  7. Protein calorie malnutrition on TPN  Plan: 1. NPO, NGT, await bowel function and/or ouput from ostomy before doing clamping trials 2. Continue TPN since NPO  3. SCD's and heparin  4. Ambulate and IS  5. Pain better controlled on 1mg  dilaudid 6. D/C foley  LOS: 8 days    DORT, Aundra Millet 01/08/2013, 8:52 AM Pager: [phone]

## 2013-01-08 NOTE — Progress Notes (Signed)
As above.  Continue nasogastric tube until definite resolution of ileus.  Obstructed for some time, so expect prolonged postoperative ileus.  Or she physical therapy and nursing help in mobilizing the patient.  That is essential in helping her recovery and preventing further complications  Continue parenteral nutrition for severe malnourished state and prolonged bowel obstruction/ileus

## 2013-01-08 NOTE — Progress Notes (Signed)
Physical Therapy Treatment Patient Details Name: [name] MRN: [id number] DOB: October 19, 1955 Today's Date: 01/08/2013 Time: 3086-5784 PT Time Calculation (min): 33 min  PT Assessment / Plan / Recommendation  PT Comments   Progressing with mobility.   Follow Up Recommendations  SNF     Does the patient have the potential to tolerate intense rehabilitation     Barriers to Discharge        Equipment Recommendations  None recommended by PT    Recommendations for Other Services OT consult  Frequency Min 3X/week   Progress towards PT Goals Progress towards PT goals: Progressing toward goals  Plan Current plan remains appropriate    Precautions / Restrictions Precautions Precautions: Fall Precaution Comments: Pt is blind, NG tube,  ostomy, multiple skeletal deformities  Restrictions Weight Bearing Restrictions: No   Pertinent Vitals/Pain Pt denies pain    Mobility  Bed Mobility Bed Mobility: Rolling Right;Right Sidelying to Sit Rolling Right: 4: Min guard Right Sidelying to Sit: 4: Min assist Supine to Sit: 4: Min assist Details for Bed Mobility Assistance: pt needs cues and occasional hand over hand assist. assist for trunk to upright Transfers Transfers: Sit to Stand;Stand to Sit Sit to Stand: From elevated surface;3: Mod assist Stand to Sit: To chair/3-in-1;3: Mod assist Details for Transfer Assistance: assist to rise, stabilize control descent. multimodal cues for safety, technique, hand placement Ambulation/Gait Ambulation/Gait Assistance: 3: Mod assist Ambulation Distance (Feet): 100 Feet Assistive device: Rolling walker Ambulation/Gait Assistance Details: assist to stabilize throughout ambulation and to maneuver with RW. LOB x 1-appears pt may have tripped over her own foot/shoe Gait Pattern: Step-through pattern;Trunk flexed;Decreased step length - right;Decreased step length - left;Decreased stride length    Exercises     PT Diagnosis:    PT Problem List:    PT Treatment Interventions:     PT Goals (current goals can now be found in the care plan section)    Visit Information  Last PT Received On: 01/08/13 Assistance Needed: +2 (safety when ambulating) History of Present Illness: Pt underewent laporoscopic lysis of adhesions for SBO 7/ 15/14. Hx of RA, Marfan's, and pt is blind    Subjective Data      Cognition  Cognition Arousal/Alertness: Awake/alert Behavior During Therapy: WFL for tasks assessed/performed Overall Cognitive Status: Within Functional Limits for tasks assessed    Balance     End of Session PT - End of Session Activity Tolerance: Patient limited by fatigue Patient left: in chair;with call bell/phone within reach Nurse Communication: Mobility status   GP     Rebeca Alert, MPT Pager: [phone]

## 2013-01-08 NOTE — Progress Notes (Signed)
As above See separate note 

## 2013-01-08 NOTE — Progress Notes (Addendum)
PARENTERAL NUTRITION CONSULT NOTE - Follow Up  Pharmacy Consult for TNA Indication: SBO  No Known Allergies  Patient Measurements: Height: 5\' 8"  (172.7 cm) Weight: 134 lb 14.7 oz (61.2 kg) IBW/kg (Calculated) : 63.9  Vital Signs: Temp: 98.1 F (36.7 C) (07/17 0610) Temp src: Oral (07/17 0610) BP: 114/75 mmHg (07/17 0610) Pulse Rate: 79 (07/17 0610) Intake/Output from previous day: 07/16 0701 - 07/17 0700 In: 1781.3 [I.V.:1781.3] Out: 2840 [Urine:1940; Emesis/NG output:900]  Labs:  Recent Labs  01/06/13 0512  WBC 5.5  HGB 10.9*  HCT 33.0*  PLT 262     Recent Labs  01/06/13 0512 01/07/13 0518 01/08/13 0510  NA 130* 131* 133*  K 3.7 4.0 3.6  CL 97 97 102  CO2 23 23 24   GLUCOSE 182* 130* 123*  BUN 8 26* 27*  CREATININE 0.48* 0.85 0.60  CALCIUM 9.0 8.3* 8.3*  MG 1.2* 1.6 1.6  PHOS 3.4 4.6 3.4  PROT 6.4  --  5.1*  ALBUMIN 2.7*  --  2.0*  AST 10  --  11  ALT 7  --  8  ALKPHOS 66  --  62  BILITOT 0.5  --  0.3  PREALBUMIN 9.7*  --   --   TRIG 132  --   --   7/17:  Corrected Ca = 9.9 Estimated Creatinine Clearance: 75.9 ml/min (by C-G formula based on Cr of 0.6).    Recent Labs  01/07/13 1821 01/08/13 0030 01/08/13 0555  GLUCAP 156* 134* 116*    Medications:  Scheduled:  . heparin  5,000 Units Subcutaneous Q8H  . hydrocortisone sod succinate (SOLU-CORTEF) inj  50 mg Intravenous Daily  . insulin aspart  0-9 Units Subcutaneous Q6H  . lip balm  1 application Topical BID  . loratadine  10 mg Oral Daily  . metoprolol  5 mg Intravenous Q6H  . pantoprazole (PROTONIX) IV  40 mg Intravenous Q12H   Infusions:  . 0.9 % NaCl with KCl 20 mEq / L 75 mL/hr at 01/08/13 0139  . Marland KitchenTPN (CLINIMIX-E) Adult 75 mL/hr at 01/07/13 1732   And  . fat emulsion 250 mL (01/07/13 1732)   PRN: albuterol, alum & mag hydroxide-simeth, bisacodyl, HYDROmorphone (DILAUDID) injection, magic mouthwash, metoprolol, ondansetron, sodium chloride  Insulin Requirements in the past 24  hours:  4 units  Nutritional Goals:  Per 7/14 RD note: Daily Kcal: 1700-1940, Protein: 73-86 grams, Fluid: 2.1 L  Goal formula/rate:  Clinimix-E 5/15 at 75 mL/hr plus Fat Emulsion 20% at 10 mL/hr will provide 1758 kcal/day and 90g protein/day  Current Nutrition:  NPO IVF = NS+20K at 64ml/hr Clinimix E 5/15 at 66ml/hr and fat emulsion at 53ml/hr  Assessment: 57 y/o F s/p emergent Hartmann's procedure 11/27/12 for diverticulitis with perforation, readmitted 7/10 with SBO that persists.  7/14 abd PICC placed and TNA started on 01/05/13. Patient is now s/p laproscopic LOA and enterotomy repair on 7/15 pm.  Na low but stable- unable to change Na-content of premixed TNA. Has NS+20K at 67ml/hr.  K wnl  Mg still wnl after receiving 2g Mag sulfate 7/15  Phos WNL  Triglycerides wnl  Prealbumin 9/7 on 7/15  CBGs improved now 116-156 - note on SoluCortef 50mg  once daily  Plan:  Tonight at 1800:  Continue Clinimix-E 5/15 to 75 mL/hr, continue fat emulsion 20% at 10 mL/hr.  Will defer maintenance IVF rate adjustments to MD (rate increased 7/15am by PA)  Multivitamins and trace elements in TNA daily.  TNA labs every Monday  and Thursday  Continue current CBG/SSI  Noted plan continue NGT until definite resolution of ileus and to await bowel function return and/or ostomy output before trying to clamp   Hessie Knows, PharmD, BCPS Pager [phone] 01/08/2013 11:42 AM

## 2013-01-09 LAB — GLUCOSE, CAPILLARY
Glucose-Capillary: 119 mg/dL — ABNORMAL HIGH (ref 70–99)
Glucose-Capillary: 119 mg/dL — ABNORMAL HIGH (ref 70–99)
Glucose-Capillary: 128 mg/dL — ABNORMAL HIGH (ref 70–99)
Glucose-Capillary: 144 mg/dL — ABNORMAL HIGH (ref 70–99)
Glucose-Capillary: 163 mg/dL — ABNORMAL HIGH (ref 70–99)

## 2013-01-09 LAB — BASIC METABOLIC PANEL
BUN: 18 mg/dL (ref 6–23)
CO2: 27 mEq/L (ref 19–32)
Calcium: 8.4 mg/dL (ref 8.4–10.5)
Chloride: 104 mEq/L (ref 96–112)
Creatinine, Ser: 0.51 mg/dL (ref 0.50–1.10)
GFR calc Af Amer: >90 mL/min (ref >90)
GFR calc non Af Amer: >90 mL/min (ref >90)
Glucose, Bld: 123 mg/dL — ABNORMAL HIGH (ref 70–99)
Potassium: 3.6 mEq/L (ref 3.5–5.1)
Sodium: 137 mEq/L (ref 135–145)

## 2013-01-09 MED ORDER — TRACE MINERALS CR-CU-F-FE-I-MN-MO-SE-ZN IV SOLN
INTRAVENOUS | Status: AC
  Administered 2013-01-09: 17:00:00 via INTRAVENOUS
  Filled 2013-01-09: qty 2000

## 2013-01-09 MED ORDER — FAT EMULSION 20 % IV EMUL
250.0000 mL | INTRAVENOUS | Status: AC
  Administered 2013-01-09: 250 mL via INTRAVENOUS
  Filled 2013-01-09: qty 250

## 2013-01-09 MED ORDER — INSULIN ASPART 100 UNIT/ML ~~LOC~~ SOLN
0.0000 [IU] | Freq: Two times a day (BID) | SUBCUTANEOUS | Status: DC
  Administered 2013-01-09 – 2013-01-10 (×2): 1 [IU] via SUBCUTANEOUS

## 2013-01-09 NOTE — Progress Notes (Signed)
Physical Therapy Treatment Patient Details Name: [name] MRN: [id number] DOB: [birth year] Today's Date: 01/09/2013 Time: 1000-1021 PT Time Calculation (min): 21 min  PT Assessment / Plan / Recommendation  PT Comments   Progressing with mobility but limited by R knee buckling this session. Pt states this has been happening a little more lately and that she has a brace at home that she uses. However pt could not describe what kind of brace it was. Continue to recommend SNF  Follow Up Recommendations  SNF     Does the patient have the potential to tolerate intense rehabilitation     Barriers to Discharge        Equipment Recommendations  None recommended by PT    Recommendations for Other Services OT consult  Frequency Min 3X/week   Progress towards PT Goals Progress towards PT goals: Progressing toward goals  Plan Current plan remains appropriate    Precautions / Restrictions Precautions Precautions: Fall Precaution Comments: Pt is blind, NG tube,  ostomy, multiple skeletal deformities. 7/18 R knee buckling-pt states she sometimes wears brace (brace is not here at hospital) Restrictions Weight Bearing Restrictions: No   Pertinent Vitals/Pain Pt denies pain    Mobility  Bed Mobility Bed Mobility: Rolling Right;Right Sidelying to Sit Rolling Right: 5: Supervision Right Sidelying to Sit: 4: Min guard Details for Bed Mobility Assistance: Increased time. VCs safety, technique, hand placement.  Transfers Transfers: Sit to Stand;Stand to Sit Sit to Stand: 4: Min assist;From bed Stand to Sit: 3: Mod assist;To chair/3-in-1;With armrests Details for Transfer Assistance: A couple attempts to get to full standing.Assist to rise, shift weight anteriorly, control descent.  Ambulation/Gait Ambulation/Gait Assistance: 3: Mod assist Ambulation Distance (Feet): 30 Feet Assistive device: Rolling walker Ambulation/Gait Assistance Details: assist to stabilize throughout ambulation  and to maneuver with RW. 2 instances of R knee buckling requiring external assist to prevent fall.  Gait Pattern: Step-through pattern;Decreased step length - right;Decreased stride length;Trunk flexed    Exercises     PT Diagnosis:    PT Problem List:   PT Treatment Interventions:     PT Goals (current goals can now be found in the care plan section)    Visit Information  Last PT Received On: 01/09/13 Assistance Needed: +2 (safety with ambulation) PT/OT Co-Evaluation/Treatment: Yes History of Present Illness: Pt underewent laporoscopic lysis of adhesions for SBO 7/ 15/14. Hx of RA, Marfan's, and pt is blind    Subjective Data      Cognition  Cognition Arousal/Alertness: Awake/alert Behavior During Therapy: WFL for tasks assessed/performed Overall Cognitive Status: Within Functional Limits for tasks assessed    Balance     End of Session PT - End of Session Activity Tolerance: Patient tolerated treatment well (Limited by knee buckling) Patient left: in chair;with call bell/phone within reach Nurse Communication: Mobility status   GP     Rebeca Alert, MPT Pager: [phone]

## 2013-01-09 NOTE — Progress Notes (Signed)
CSW assisting with d/c planning. Pt plans to go to [facility] when stable for d/c. If TPN is neede at D/C SNF will need 1-2 days notice to prepare. CSW will continue to follow to assist with d/c planning needs.  Cori Razor LCSW [phone]

## 2013-01-09 NOTE — Progress Notes (Signed)
3 Days Post-Op  Subjective: Pt feels only a little pain.  NG tube annoying her.  Pt tolerating sips of liquids and ice chips.  Working with PT.  Ostomy with minimal gas, no BM.  Urinating normally.  Objective: Vital signs in last 24 hours: Temp:  [98 F (36.7 C)-98.9 F (37.2 C)] 98 F (36.7 C) (07/18 0550) Pulse Rate:  [80-85] 80 (07/18 0550) Resp:  [18] 18 (07/18 0550) BP: (131-157)/(68-86) 136/83 mmHg (07/18 0550) SpO2:  [99 %-100 %] 99 % (07/18 0550) Last BM Date: 01/04/13  Intake/Output from previous day: 07/17 0701 - 07/18 0700 In: 1212.5 [I.V.:1212.5] Out: 3226 [Urine:2926; Emesis/NG output:300] Intake/Output this shift: Total I/O In: -  Out: 350 [Urine:350]  PE: Gen:  Alert, NAD, pleasant Abd: Soft, NT/ND, +BS, no HSM, midline wound with good granulation, port sites C/D with dressing intact   Lab Results:  No results found for this basename: WBC, HGB, HCT, PLT,  in the last 72 hours BMET  Recent Labs  01/08/13 0510 01/09/13 0427  NA 133* 137  K 3.6 3.6  CL 102 104  CO2 24 27  GLUCOSE 123* 123*  BUN 27* 18  CREATININE 0.60 0.51  CALCIUM 8.3* 8.4   PT/INR No results found for this basename: LABPROT, INR,  in the last 72 hours CMP     Component Value Date/Time   NA 137 01/09/2013 0427   K 3.6 01/09/2013 0427   CL 104 01/09/2013 0427   CO2 27 01/09/2013 0427   GLUCOSE 123* 01/09/2013 0427   BUN 18 01/09/2013 0427   CREATININE 0.51 01/09/2013 0427   CALCIUM 8.4 01/09/2013 0427   PROT 5.1* 01/08/2013 0510   ALBUMIN 2.0* 01/08/2013 0510   AST 11 01/08/2013 0510   ALT 8 01/08/2013 0510   ALKPHOS 62 01/08/2013 0510   BILITOT 0.3 01/08/2013 0510   GFRNONAA >90 01/09/2013 0427   GFRAA >90 01/09/2013 0427   Lipase     Component Value Date/Time   LIPASE 31 11/26/2012 2330       Studies/Results: No results found.  Anti-infectives: Anti-infectives   Start     Dose/Rate Route Frequency Ordered Stop   01/06/13 2200  cefOXitin (MEFOXIN) 2 g in dextrose 5 % 50  mL IVPB     2 g Intravenous 3 times per day 01/06/13 1900 01/07/13 0532   01/06/13 1345  cefOXitin (MEFOXIN) 2 g in dextrose 5 % 50 mL IVPB     2 g Intravenous  Once 01/06/13 1343 01/06/13 1340   01/06/13 1245  [MAR Hold]  clindamycin (CLEOCIN) 900 mg, gentamicin (GARAMYCIN) 240 mg in sodium chloride 0.9 % 1,000 mL for intraperitoneal lavage  Status:  Discontinued     (On MAR Hold since 01/06/13 1249)    Intraperitoneal To Surgery 01/06/13 1236 01/06/13 1811       Assessment/Plan 1. SBO S/p Exploratory laparotomy with sigmoid colectomy and diverting end colostomy with biopsy of peritoneal nodule. 11/27/12 Dr. Biagio Quint after focally perforated diverticulitis. Now POD #3 s/p LOA for SBO  2. Marfan's  3. RA on steroids  4. HTN  5. Legally blind  6. HOH  7. Protein calorie malnutrition on TPN   Plan:  1. Start clamping trials since NG output is 300/24hours.  Had a small amount of gas in the bag 2. Continue TPN until tolerating liquids well 3. DVT Prophylaxis - SCD's and heparin  4. Ambulate and IS  5. Pain well controlled, try to reduce narcotics to help promote bowel  function 6. Foley out yesterday 7.  PT recommending SNF upon discharge, hopefully by Monday or Tuesday    LOS: 9 days    [name], [name] 01/09/2013, 8:52 AM Pager: [phone]

## 2013-01-09 NOTE — Progress Notes (Signed)
Agree with trying NGT clamping trials

## 2013-01-09 NOTE — Progress Notes (Signed)
PARENTERAL NUTRITION CONSULT NOTE - Follow Up  Pharmacy Consult for TNA Indication: SBO  No Known Allergies  Patient Measurements: Height: 5\' 8"  (172.7 cm) Weight: 134 lb 14.7 oz (61.2 kg) IBW/kg (Calculated) : 63.9  Vital Signs: Temp: 98 F (36.7 C) (07/18 0550) Temp src: Oral (07/18 0550) BP: 136/83 mmHg (07/18 0550) Pulse Rate: 80 (07/18 0550) Intake/Output from previous day: 07/17 0701 - 07/18 0700 In: 1212.5 [I.V.:1212.5] Out: 3226 [Urine:2926; Emesis/NG output:300]  Labs: No results found for this basename: WBC, HGB, HCT, PLT, APTT, INR,  in the last 72 hours   Recent Labs  01/07/13 0518 01/08/13 0510 01/09/13 0427  NA 131* 133* 137  K 4.0 3.6 3.6  CL 97 102 104  CO2 23 24 27   GLUCOSE 130* 123* 123*  BUN 26* 27* 18  CREATININE 0.85 0.60 0.51  CALCIUM 8.3* 8.3* 8.4  MG 1.6 1.6  --   PHOS 4.6 3.4  --   PROT  --  5.1*  --   ALBUMIN  --  2.0*  --   AST  --  11  --   ALT  --  8  --   ALKPHOS  --  62  --   BILITOT  --  0.3  --   7/18:  Corrected Ca = 10 Estimated Creatinine Clearance: 75.9 ml/min (by C-G formula based on Cr of 0.51).    Recent Labs  01/09/13 0006 01/09/13 0251 01/09/13 0803  GLUCAP 128* 119* 119*    Medications:  Scheduled:  . heparin  5,000 Units Subcutaneous Q8H  . hydrocortisone sod succinate (SOLU-CORTEF) inj  50 mg Intravenous Daily  . insulin aspart  0-9 Units Subcutaneous Q8H  . lip balm  1 application Topical BID  . loratadine  10 mg Oral Daily  . metoprolol  5 mg Intravenous Q6H  . pantoprazole (PROTONIX) IV  40 mg Intravenous Q12H   Infusions:  . 0.9 % NaCl with KCl 20 mEq / L 75 mL/hr (01/09/13 0349)  . Marland KitchenTPN (CLINIMIX-E) Adult 75 mL/hr at 01/08/13 1712   And  . fat emulsion 250 mL (01/08/13 1712)   PRN: albuterol, alum & mag hydroxide-simeth, bisacodyl, HYDROmorphone (DILAUDID) injection, magic mouthwash, metoprolol, ondansetron, sodium chloride  Insulin Requirements in the past 24 hours:  None  Nutritional  Goals:  Per 7/14 RD note: Daily Kcal: 1700-1940, Protein: 73-86 grams, Fluid: 2.1 L  Goal formula/rate:  Clinimix-E 5/15 at 75 mL/hr plus Fat Emulsion 20% at 10 mL/hr, providing 1758 kcal/day and 90g protein/day  Current Nutrition:  NPO IVF = NS+20K at 44ml/hr Clinimix E 5/15 at 93ml/hr and fat emulsion at 9ml/hr  Assessment: 57 y/o F s/p emergent Hartmann's procedure 11/27/12 for diverticulitis with perforation, readmitted 7/10 with SBO that persisted.  TNA started 7/14.  On .  7/15 went to OR for laparoscopic lysis of adhesions and enterotomy repair- now awaiting return of bowel function.  Reportedly had gas in ostomy bag today and NG output is down to 300 mL/24 hr.  Electrolytes WNL today although K near low end of normal.   Triglycerides WNL on 7/15  Prealbumin 9.7 on 7/15  CBGs consistently <150, not requiring sliding scale insulin.  Noted on hydrocortisone 50 mg IV daily while off prednisone.  Plan:  Tonight at 1800:  Continue TNA goal formula and rate: Clinimix-E 5/15 at 75 mL/hr plus fat emulsion 20% at 10 mL/hr.  Will defer maintenance IVF rate adjustments to surgery (rate increased 7/15 by PA).  Multivitamins and trace  elements in TNA daily.  Reduce CBGs and SSI coverage to BID.  TNA labs every Monday and Thursday.   Elie Goody, PharmD, BCPS Pager: [phone] 01/09/2013  9:59 AM

## 2013-01-09 NOTE — Progress Notes (Signed)
Occupational Therapy Treatment Patient Details Name: [name] MRN: [id number] DOB: 1955/09/17 Today's Date: 01/09/2013 Time: 1000-1023 OT Time Calculation (min): 23 min  OT Assessment / Plan / Recommendation  OT comments  Pt resistant to attempting adls at times stating she is just not up to it but does participate well when mobility is involved.    Follow Up Recommendations  SNF    Barriers to Discharge       Equipment Recommendations  None recommended by OT    Recommendations for Other Services    Frequency Min 2X/week   Progress towards OT Goals Progress towards OT goals: Progressing toward goals  Plan Discharge plan remains appropriate    Precautions / Restrictions Precautions Precautions: Fall Precaution Comments: Pt is blind, NG tube,  ostomy, multiple skeletal deformities. 7/18 R knee buckling-pt states she sometimes wears brace (brace is not here at hospital) Required Braces or Orthoses: Other Brace/Splint (Pt states she wears a R knee brace which is at home.) Other Brace/Splint: Pt has knee brace that is at home for R knee that was buckling during ambulatin today. Restrictions Weight Bearing Restrictions: No   Pertinent Vitals/Pain Pt w no c/o pain.    ADL  Lower Body Dressing: Maximal assistance Where Assessed - Lower Body Dressing: Supported sit to stand Toilet Transfer: Minimal assistance Toilet Transfer Method: Surveyor, minerals: Bedside commode Toileting - Clothing Manipulation and Hygiene: Set up Where Assessed - Engineer, mining and Hygiene: Sit on 3-in-1 or toilet Equipment Used: Rolling walker Transfers/Ambulation Related to ADLs: Pt walked in hallway after sitting on side of bed to do LE dressing.  Pt with buckling R knee ( see PT note) ADL Comments: Pt again stated she could not tolerate bending over to fasten shoes.    OT Diagnosis:    OT Problem List:   OT Treatment Interventions:     OT Goals(current  goals can now be found in the care plan section) Acute Rehab OT Goals Patient Stated Goal: to be able to walk OT Goal Formulation: With patient Time For Goal Achievement: 01/20/13 Potential to Achieve Goals: Good ADL Goals Pt Will Perform Lower Body Bathing: with min assist;sit to/from stand Pt Will Perform Lower Body Dressing: with min assist;sit to/from stand Pt Will Transfer to Toilet: with min guard assist;ambulating;bedside commode  Visit Information  Last OT Received On: 01/09/13 Assistance Needed: +2 (safety with ambulation) PT/OT Co-Evaluation/Treatment: Yes History of Present Illness: Pt underewent laporoscopic lysis of adhesions for SBO 7/ 15/14. Hx of RA, Marfan's, and pt is blind    Subjective Data      Prior Functioning       Cognition  Cognition Arousal/Alertness: Awake/alert Behavior During Therapy: WFL for tasks assessed/performed Overall Cognitive Status: Within Functional Limits for tasks assessed    Mobility  Bed Mobility Bed Mobility: Rolling Right;Right Sidelying to Sit Rolling Right: 5: Supervision Right Sidelying to Sit: 4: Min guard Details for Bed Mobility Assistance: Increased time. VCs safety, technique, hand placement.  Transfers Transfers: Sit to Stand;Stand to Sit Sit to Stand: 4: Min assist;From bed Stand to Sit: 3: Mod assist;To chair/3-in-1;With armrests Details for Transfer Assistance: A couple attempts to get to full standing.Assist to rise, shift weight anteriorly, control descent.     Exercises      Balance Balance Balance Assessed: No   End of Session OT - End of Session Equipment Utilized During Treatment: Rolling walker Activity Tolerance: Patient tolerated treatment well Patient left: in chair;with call bell/phone  within reach Nurse Communication: Mobility status  GO     Hope Budds 01/09/2013, 10:57 AM [phone]

## 2013-01-10 LAB — GLUCOSE, CAPILLARY
Glucose-Capillary: 116 mg/dL — ABNORMAL HIGH (ref 70–99)
Glucose-Capillary: 136 mg/dL — ABNORMAL HIGH (ref 70–99)

## 2013-01-10 MED ORDER — FAT EMULSION 20 % IV EMUL
250.0000 mL | INTRAVENOUS | Status: AC
  Administered 2013-01-10: 250 mL via INTRAVENOUS
  Filled 2013-01-10: qty 250

## 2013-01-10 MED ORDER — TRACE MINERALS CR-CU-F-FE-I-MN-MO-SE-ZN IV SOLN
INTRAVENOUS | Status: AC
  Administered 2013-01-10: 18:00:00 via INTRAVENOUS
  Filled 2013-01-10: qty 2000

## 2013-01-10 NOTE — Progress Notes (Signed)
PARENTERAL NUTRITION CONSULT NOTE - Follow Up  Pharmacy Consult for TNA Indication: SBO  No Known Allergies  Patient Measurements: Height: 5\' 8"  (172.7 cm) Weight: 134 lb 14.7 oz (61.2 kg) IBW/kg (Calculated) : 63.9  Vital Signs: Temp: 98.1 F (36.7 C) (07/19 0545) Temp src: Oral (07/19 0545) BP: 151/71 mmHg (07/19 0545) Pulse Rate: 72 (07/19 0545) Intake/Output from previous day: 07/18 0701 - 07/19 0700 In: 7671.2 [P.O.:180; I.V.:1452.5; TPN:6038.7] Out: 3025 [Urine:2950; Stool:75]  Labs: No results found for this basename: WBC, HGB, HCT, PLT, APTT, INR,  in the last 72 hours   Recent Labs  01/08/13 0510 01/09/13 0427  NA 133* 137  K 3.6 3.6  CL 102 104  CO2 24 27  GLUCOSE 123* 123*  BUN 27* 18  CREATININE 0.60 0.51  CALCIUM 8.3* 8.4  MG 1.6  --   PHOS 3.4  --   PROT 5.1*  --   ALBUMIN 2.0*  --   AST 11  --   ALT 8  --   ALKPHOS 62  --   BILITOT 0.3  --   7/18:  Corrected Ca = 10 Estimated Creatinine Clearance: 75.9 ml/min (by C-G formula based on Cr of 0.51).    Recent Labs  01/09/13 1623 01/09/13 1945 01/10/13 0759  GLUCAP 163* 144* 116*    Medications:  Scheduled:  . heparin  5,000 Units Subcutaneous Q8H  . hydrocortisone sod succinate (SOLU-CORTEF) inj  50 mg Intravenous Daily  . insulin aspart  0-9 Units Subcutaneous Q12H  . lip balm  1 application Topical BID  . loratadine  10 mg Oral Daily  . metoprolol  5 mg Intravenous Q6H  . pantoprazole (PROTONIX) IV  40 mg Intravenous Q12H   Infusions:  . 0.9 % NaCl with KCl 20 mEq / L 75 mL/hr (01/10/13 0500)  . Marland KitchenTPN (CLINIMIX-E) Adult 75 mL/hr at 01/09/13 1704   And  . fat emulsion 250 mL (01/09/13 1705)   PRN: albuterol, alum & mag hydroxide-simeth, bisacodyl, HYDROmorphone (DILAUDID) injection, magic mouthwash, metoprolol, ondansetron, sodium chloride  Insulin Requirements in the past 24 hours:  1 unit  Nutritional Goals:  Per 7/14 RD note: Daily Kcal: 1700-1940, Protein: 73-86 grams,  Fluid: 2.1 L  Goal formula/rate:  Clinimix-E 5/15 at 75 mL/hr plus Fat Emulsion 20% at 10 mL/hr, providing 1758 kcal/day and 90g protein/day  Current Nutrition:  Advanced to CLD 7/19am IVF = NS+20K at 34ml/hr Clinimix E 5/15 at 47ml/hr and fat emulsion at 44ml/hr  Assessment: 57 y/o F s/p emergent Hartmann's procedure 11/27/12 for diverticulitis with perforation, readmitted 7/10 with SBO that persisted.  TNA started 7/14.  On .  7/15 went to OR for laparoscopic lysis of adhesions and enterotomy repair- now awaiting return of bowel function.  Reportedly has a lot of gas in ostomy bag overnight. Diet advanced to CLD 7/19am. Hopefully patient will tolerate liquids and TNA can be d/c'd soon.  Electrolytes WNL on 7/18   Triglycerides WNL on 7/15  Prealbumin 9.7 on 7/15  CBGs <150, with the exception of one reading of 163 yesterday.  Noted on hydrocortisone 50 mg IV daily while off prednisone.  Plan:  Tonight at 1800:  Continue TNA goal formula and rate: Clinimix-E 5/15 at 75 mL/hr plus fat emulsion 20% at 10 mL/hr.  Will defer maintenance IVF rate adjustments to surgery (rate increased 7/15 by PA).  Multivitamins and trace elements in TNA daily.  Continue CBGs and SSI BID coverage for now - will change to TID  with meals once diet is advanced further  TNA labs every Monday and Thursday.  Hopefully patient will tolerate diet advancement and can d/c TNA soon.   Darrol Angel, PharmD Pager: [phone] 01/10/2013  8:41 AM

## 2013-01-10 NOTE — Progress Notes (Signed)
4 Days Post-Op  Subjective: Pt feels only a little pain.  NG tube annoying her.  Pt tolerating sips of liquids and ice chips.  Working with PT.  Ostomy with quite a bit of gas overnight, no BM.  Urinating normally.  Objective: Vital signs in last 24 hours: Temp:  [98.1 F (36.7 C)-98.9 F (37.2 C)] 98.1 F (36.7 C) (07/19 0545) Pulse Rate:  [72-85] 72 (07/19 0545) Resp:  [16-18] 16 (07/19 0545) BP: (128-161)/(71-79) 151/71 mmHg (07/19 0545) SpO2:  [100 %] 100 % (07/19 0545) Last BM Date: 01/04/13  Intake/Output from previous day: 07/18 0701 - 07/19 0700 In: 7671.2 [P.O.:180; I.V.:1452.5; TPN:6038.7] Out: 3025 [Urine:2950; Stool:75] Intake/Output this shift:    PE: Gen:  Alert, NAD, pleasant Abd: Soft, NT/ND, +BS, no HSM, midline wound with good granulation, port sites C/D with dressing intact   Lab Results:  No results found for this basename: WBC, HGB, HCT, PLT,  in the last 72 hours BMET  Recent Labs  01/08/13 0510 01/09/13 0427  NA 133* 137  K 3.6 3.6  CL 102 104  CO2 24 27  GLUCOSE 123* 123*  BUN 27* 18  CREATININE 0.60 0.51  CALCIUM 8.3* 8.4   PT/INR No results found for this basename: LABPROT, INR,  in the last 72 hours CMP     Component Value Date/Time   NA 137 01/09/2013 0427   K 3.6 01/09/2013 0427   CL 104 01/09/2013 0427   CO2 27 01/09/2013 0427   GLUCOSE 123* 01/09/2013 0427   BUN 18 01/09/2013 0427   CREATININE 0.51 01/09/2013 0427   CALCIUM 8.4 01/09/2013 0427   PROT 5.1* 01/08/2013 0510   ALBUMIN 2.0* 01/08/2013 0510   AST 11 01/08/2013 0510   ALT 8 01/08/2013 0510   ALKPHOS 62 01/08/2013 0510   BILITOT 0.3 01/08/2013 0510   GFRNONAA >90 01/09/2013 0427   GFRAA >90 01/09/2013 0427   Lipase     Component Value Date/Time   LIPASE 31 11/26/2012 2330       Studies/Results: No results found.  Anti-infectives: Anti-infectives   Start     Dose/Rate Route Frequency Ordered Stop   01/06/13 2200  cefOXitin (MEFOXIN) 2 g in dextrose 5 % 50 mL IVPB      2 g Intravenous 3 times per day 01/06/13 1900 01/07/13 0532   01/06/13 1345  cefOXitin (MEFOXIN) 2 g in dextrose 5 % 50 mL IVPB     2 g Intravenous  Once 01/06/13 1343 01/06/13 1340   01/06/13 1245  [MAR Hold]  clindamycin (CLEOCIN) 900 mg, gentamicin (GARAMYCIN) 240 mg in sodium chloride 0.9 % 1,000 mL for intraperitoneal lavage  Status:  Discontinued     (On MAR Hold since 01/06/13 1249)    Intraperitoneal To Surgery 01/06/13 1236 01/06/13 1811       Assessment/Plan 1. SBO S/p Exploratory laparotomy with sigmoid colectomy and diverting end colostomy with biopsy of peritoneal nodule. 11/27/12 Dr. Biagio Quint after focally perforated diverticulitis. Now POD #3 s/p LOA for SBO  2. Marfan's  3. RA on steroids  4. HTN  5. Legally blind  6. HOH  7. Protein calorie malnutrition on TPN   Plan:  1. Having better bowel function.  Will d/c NG and start clears 2. Continue TPN until tolerating liquids well 3. DVT Prophylaxis - SCD's and heparin  4. Ambulate and IS  5. Pain well controlled, try to reduce narcotics to help promote bowel function 6. Foley out - urinating well 7.  PT recommending SNF upon discharge, hopefully by Monday or Tuesday    LOS: 10 days    [place], Helmut Muster C. 01/10/2013, 8:20 AM Pager: [phone]

## 2013-01-10 NOTE — Progress Notes (Signed)
Physical Therapy Treatment Patient Details Name: [name] MRN: [id number] DOB: Nov 18, 1955 Today's Date: 01/10/2013 Time: 5784-6962 PT Time Calculation (min): 20 min  PT Assessment / Plan / Recommendation  PT Comments   Pt is making steady progress.    Follow Up Recommendations  SNF     Does the patient have the potential to tolerate intense rehabilitation     Barriers to Discharge        Equipment Recommendations  None recommended by PT    Recommendations for Other Services    Frequency Min 3X/week   Progress towards PT Goals Progress towards PT goals: Progressing toward goals  Plan Current plan remains appropriate    Precautions / Restrictions     Pertinent Vitals/Pain No c/o pain    Mobility  Bed Mobility Bed Mobility: Rolling Right;Right Sidelying to Sit Rolling Right: 5: Supervision Right Sidelying to Sit: 4: Min guard Details for Bed Mobility Assistance: pt able to initiate  bed mobiity Transfers Transfers: Sit to Stand;Stand to Sit Sit to Stand: 4: Min assist;From bed Stand to Sit: 3: Mod assist;To chair/3-in-1;With armrests Details for Transfer Assistance: needs assist to control descent.  Pt to bedside commode to void  and then to chair after walking Ambulation/Gait Ambulation/Gait Assistance: 4: Min assist Ambulation Distance (Feet): 40 Feet Assistive device: Rolling walker Ambulation/Gait Assistance Details: needs assist to direct RW    Gait Pattern: Step-through pattern;Decreased step length - right;Decreased stride length;Trunk flexed Gait velocity: decreased General Gait Details: Pt uses RW for support , but needs extra staiblity due to generalized weakness Stairs: No Wheelchair Mobility Wheelchair Mobility: No    Exercises     PT Diagnosis:    PT Problem List:   PT Treatment Interventions:     PT Goals (current goals can now be found in the care plan section)    Visit Information  Last PT Received On: 01/10/13 Assistance Needed:  +1    Subjective Data      Cognition  Cognition Arousal/Alertness: Awake/alert Behavior During Therapy: WFL for tasks assessed/performed Overall Cognitive Status: Within Functional Limits for tasks assessed    Balance     End of Session PT - End of Session Activity Tolerance: Patient tolerated treatment well Patient left: in chair;with call bell/phone within reach Nurse Communication: Mobility status   GP     [name] 01/10/2013, 2:40 PM

## 2013-01-11 LAB — GLUCOSE, CAPILLARY
Glucose-Capillary: 115 mg/dL — ABNORMAL HIGH (ref 70–99)
Glucose-Capillary: 126 mg/dL — ABNORMAL HIGH (ref 70–99)

## 2013-01-11 MED ORDER — ADULT MULTIVITAMIN W/MINERALS CH
1.0000 | ORAL_TABLET | Freq: Every day | ORAL | Status: DC
  Administered 2013-01-11 – 2013-01-13 (×3): 1 via ORAL
  Filled 2013-01-11 (×3): qty 1

## 2013-01-11 NOTE — Progress Notes (Signed)
5 Days Post-Op  Subjective: Pt having little pain.   Pt tolerating clears.  Working with PT.  Ostomy functioning well.  Urinating well.  Objective: Vital signs in last 24 hours: Temp:  [97.9 F (36.6 C)-98.7 F (37.1 C)] 97.9 F (36.6 C) (07/20 0412) Pulse Rate:  [75-95] 75 (07/20 0412) Resp:  [16-18] 18 (07/20 0412) BP: (138-172)/(79-93) 157/82 mmHg (07/20 0412) SpO2:  [91 %-100 %] 99 % (07/20 0412) Last BM Date: 01/10/13  Intake/Output from previous day: 07/19 0701 - 07/20 0700 In: 5874.7 [I.V.:2753.8; TPN:3120.9] Out: 1950 [Urine:1650; Stool:300] Intake/Output this shift:    PE: Gen:  Alert, NAD, pleasant Abd: Soft, NT/ND, +BS, no HSM, midline wound with good granulation, port sites C/D    Lab Results:  No results found for this basename: WBC, HGB, HCT, PLT,  in the last 72 hours BMET  Recent Labs  01/09/13 0427  NA 137  K 3.6  CL 104  CO2 27  GLUCOSE 123*  BUN 18  CREATININE 0.51  CALCIUM 8.4   PT/INR No results found for this basename: LABPROT, INR,  in the last 72 hours CMP     Component Value Date/Time   NA 137 01/09/2013 0427   K 3.6 01/09/2013 0427   CL 104 01/09/2013 0427   CO2 27 01/09/2013 0427   GLUCOSE 123* 01/09/2013 0427   BUN 18 01/09/2013 0427   CREATININE 0.51 01/09/2013 0427   CALCIUM 8.4 01/09/2013 0427   PROT 5.1* 01/08/2013 0510   ALBUMIN 2.0* 01/08/2013 0510   AST 11 01/08/2013 0510   ALT 8 01/08/2013 0510   ALKPHOS 62 01/08/2013 0510   BILITOT 0.3 01/08/2013 0510   GFRNONAA >90 01/09/2013 0427   GFRAA >90 01/09/2013 0427   Lipase     Component Value Date/Time   LIPASE 31 11/26/2012 2330       Studies/Results: No results found.  Anti-infectives: Anti-infectives   Start     Dose/Rate Route Frequency Ordered Stop   01/06/13 2200  cefOXitin (MEFOXIN) 2 g in dextrose 5 % 50 mL IVPB     2 g Intravenous 3 times per day 01/06/13 1900 01/07/13 0532   01/06/13 1345  cefOXitin (MEFOXIN) 2 g in dextrose 5 % 50 mL IVPB     2 g Intravenous   Once 01/06/13 1343 01/06/13 1340   01/06/13 1245  [MAR Hold]  clindamycin (CLEOCIN) 900 mg, gentamicin (GARAMYCIN) 240 mg in sodium chloride 0.9 % 1,000 mL for intraperitoneal lavage  Status:  Discontinued     (On MAR Hold since 01/06/13 1249)    Intraperitoneal To Surgery 01/06/13 1236 01/06/13 1811       Assessment/Plan 1. SBO S/p Exploratory laparotomy with sigmoid colectomy and diverting end colostomy with biopsy of peritoneal nodule. 11/27/12 Dr. Biagio Quint after focally perforated diverticulitis. Now POD #3 s/p LOA for SBO  2. Marfan's  3. RA on steroids  4. HTN  5. Legally blind  6. HOH  7. Protein calorie malnutrition on TPN   Plan:  1. Having better bowel function.  Will switch to soft diet 2. Wean TPN  3. DVT Prophylaxis - SCD's and heparin  4. Ambulate and IS  5. Pain well controlled, try to reduce narcotics to help promote bowel function 6. Foley out - urinating well 7.  PT recommending SNF upon discharge, hopefully by Monday or Tuesday    LOS: 11 days    Jisele Price C. 01/11/2013, 8:21 AM

## 2013-01-11 NOTE — Progress Notes (Signed)
  Pharmacy Note (Brief) - Wean TNA to Off  57 yo F on TNA for SBO. Per Surgery Note today, patient is tolerating clears, diet has been advanced to soft, and ok to wean TNA. TNA currently running at 51ml/hr with IV lipids at 49ml/hr.  Plan: 1)  At 10am today, turn TNA down to 63ml/hr 2)  At 18:00 today, d/c both TNA and lipid bags (discard unused portions) 3)  D/C CBGs and SSI (no hx of DM, CBGs <150 while on TNA) 4)  D/C TNA labs 5)  Change MVI to PO 6)  Defer IVF rate changes to MD  Pharmacy will sign off.  Darrol Angel, PharmD Pager: [phone]  01/11/2013 9:07 AM

## 2013-01-12 DIAGNOSIS — H548 Legal blindness, as defined in USA: Secondary | ICD-10-CM

## 2013-01-12 DIAGNOSIS — Q8740 Marfan's syndrome, unspecified: Secondary | ICD-10-CM

## 2013-01-12 DIAGNOSIS — I69998 Other sequelae following unspecified cerebrovascular disease: Secondary | ICD-10-CM

## 2013-01-12 DIAGNOSIS — Z433 Encounter for attention to colostomy: Secondary | ICD-10-CM

## 2013-01-12 DIAGNOSIS — K5733 Diverticulitis of large intestine without perforation or abscess with bleeding: Secondary | ICD-10-CM

## 2013-01-12 LAB — GLUCOSE, CAPILLARY: Glucose-Capillary: 96 mg/dL (ref 70–99)

## 2013-01-12 MED ORDER — BOOST / RESOURCE BREEZE PO LIQD
1.0000 | Freq: Two times a day (BID) | ORAL | Status: DC
  Administered 2013-01-12 – 2013-01-13 (×2): 1 via ORAL

## 2013-01-12 MED ORDER — PANTOPRAZOLE SODIUM 40 MG PO TBEC
40.0000 mg | DELAYED_RELEASE_TABLET | Freq: Two times a day (BID) | ORAL | Status: DC
  Administered 2013-01-12 – 2013-01-13 (×2): 40 mg via ORAL
  Filled 2013-01-12 (×3): qty 1

## 2013-01-12 NOTE — Progress Notes (Signed)
Attempted to see pt for OT.  Pt refusing stating she needed to sleep.  Encouraged pt to participate but pt did not.  Will attempt back as schedule allows. Tory Emerald,  [phone]

## 2013-01-12 NOTE — Progress Notes (Signed)
Patient ID: [name], female   DOB: [birth year], 57 y.o.   MRN: [id number] 6 Days Post-Op  Subjective: Pt having a little pain.   Pt tolerating soft diet.  Working with PT.  Ostomy functioning well.  Urinating well.  Objective: Vital signs in last 24 hours: Temp:  [98.1 F (36.7 C)-98.5 F (36.9 C)] 98.5 F (36.9 C) (07/21 0413) Pulse Rate:  [82-92] 82 (07/21 0413) Resp:  [18] 18 (07/21 0413) BP: (148-159)/(81-91) 159/81 mmHg (07/21 0413) SpO2:  [98 %-100 %] 98 % (07/21 0413) Last BM Date: 01/11/13  Intake/Output from previous day: 07/20 0701 - 07/21 0700 In: 3257.5 [P.O.:480; I.V.:1800; TPN:977.5] Out: 1275 [Urine:950; Stool:325] Intake/Output this shift: Total I/O In: -  Out: 450 [Urine:450]  PE: Gen:  Alert, NAD, pleasant Abd: Soft, NT/ND, +BS, midline wound with good granulation, port sites C/D  Ostomy with good output and gas in bag   Lab Results:  No results found for this basename: WBC, HGB, HCT, PLT,  in the last 72 hours BMET No results found for this basename: NA, K, CL, CO2, GLUCOSE, BUN, CREATININE, CALCIUM,  in the last 72 hours PT/INR No results found for this basename: LABPROT, INR,  in the last 72 hours CMP     Component Value Date/Time   NA 137 01/09/2013 0427   K 3.6 01/09/2013 0427   CL 104 01/09/2013 0427   CO2 27 01/09/2013 0427   GLUCOSE 123* 01/09/2013 0427   BUN 18 01/09/2013 0427   CREATININE 0.51 01/09/2013 0427   CALCIUM 8.4 01/09/2013 0427   PROT 5.1* 01/08/2013 0510   ALBUMIN 2.0* 01/08/2013 0510   AST 11 01/08/2013 0510   ALT 8 01/08/2013 0510   ALKPHOS 62 01/08/2013 0510   BILITOT 0.3 01/08/2013 0510   GFRNONAA >90 01/09/2013 0427   GFRAA >90 01/09/2013 0427   Lipase     Component Value Date/Time   LIPASE 31 11/26/2012 2330       Studies/Results: No results found.  Anti-infectives: Anti-infectives   Start     Dose/Rate Route Frequency Ordered Stop   01/06/13 2200  cefOXitin (MEFOXIN) 2 g in dextrose 5 % 50 mL IVPB     2 g  Intravenous 3 times per day 01/06/13 1900 01/07/13 0532   01/06/13 1345  cefOXitin (MEFOXIN) 2 g in dextrose 5 % 50 mL IVPB     2 g Intravenous  Once 01/06/13 1343 01/06/13 1340   01/06/13 1245  [MAR Hold]  clindamycin (CLEOCIN) 900 mg, gentamicin (GARAMYCIN) 240 mg in sodium chloride 0.9 % 1,000 mL for intraperitoneal lavage  Status:  Discontinued     (On MAR Hold since 01/06/13 1249)    Intraperitoneal To Surgery 01/06/13 1236 01/06/13 1811       Assessment/Plan 1. SBO S/p Exploratory laparotomy with sigmoid colectomy and diverting end colostomy with biopsy of peritoneal nodule. 11/27/12 Dr. Biagio Quint after focally perforated diverticulitis. Now POD #6 s/p LOA for SBO  2. Marfan's  3. RA on steroids  4. HTN  5. Legally blind  6. HOH  7. Protein calorie malnutrition on TPN   Plan:  1. Good bowel function.  Will switch to soft diet, regular diet ok 2. DVT Prophylaxis - SCD's and heparin  3. Ambulate and IS  4. Pain well controlled, try to reduce narcotics to help promote bowel function 5. Foley out - urinating well 6. PT recommending SNF upon discharge, hopefully by Tuesday    LOS: 12 days    [name],  [name] 01/12/2013, 9:30 AM

## 2013-01-12 NOTE — Progress Notes (Addendum)
NUTRITION FOLLOW UP  Intervention:   - Resource Breeze BID - Encouraged pt to continue to eat well - Recommend nursing get updated weight - Will continue to monitor   Nutrition Dx:   Inadequate oral intake related to SBO as evidenced by pt NPO with last PO intake 5 days ago - no longer appropriate, diet advanced.   Increased nutrient needs related recent surgery for small bowel obstruction as evidenced by MD notes.   Goal:   Pt to meet >/= 90% of their estimated nutrition needs - met today  Monitor:   Weights, labs, intake  Assessment:   TPN d/c 7/20. Met with pt who c/o having no appetite, but has been trying to make herself eat. Pt does not like Ensure but is willing to try [employer]. Pt with better bowel function, on POD# 6 abdominal exploration and lysis of adhesions. Pt able to eat 100% of pancakes and sausage this morning for breakfast and states she is still full. No new weights. Pt with stool output so far today from colostomy.   Height: Ht Readings from Last 1 Encounters:  01/01/13 5\' 8"  (1.727 m)    Weight Status:   Wt Readings from Last 1 Encounters:  01/01/13 134 lb 14.7 oz (61.2 kg)    Re-estimated needs:  Kcal: 1700-1940 Protein: 73-86g Fluid: 1.7-1.9L/day  Skin: Abdominal incisions  Diet Order: General   Intake/Output Summary (Last 24 hours) at 01/12/13 1055 Last data filed at 01/12/13 1041  Gross per 24 hour  Intake 2617.5 ml  Output   1825 ml  Net  792.5 ml    Last BM: 7/20   Labs:   Recent Labs Lab 01/06/13 0512 01/07/13 0518 01/08/13 0510 01/09/13 0427  NA 130* 131* 133* 137  K 3.7 4.0 3.6 3.6  CL 97 97 102 104  CO2 23 23 24 27   BUN 8 26* 27* 18  CREATININE 0.48* 0.85 0.60 0.51  CALCIUM 9.0 8.3* 8.3* 8.4  MG 1.2* 1.6 1.6  --   PHOS 3.4 4.6 3.4  --   GLUCOSE 182* 130* 123* 123*    CBG (last 3)   Recent Labs  01/10/13 2009 01/11/13 0733 01/11/13 2033  GLUCAP 136* 115* 126*    Scheduled Meds: . heparin   5,000 Units Subcutaneous Q8H  . hydrocortisone sod succinate (SOLU-CORTEF) inj  50 mg Intravenous Daily  . lip balm  1 application Topical BID  . loratadine  10 mg Oral Daily  . metoprolol  5 mg Intravenous Q6H  . multivitamin with minerals  1 tablet Oral Daily  . pantoprazole (PROTONIX) IV  40 mg Intravenous Q12H    Continuous Infusions: . 0.9 % NaCl with KCl 20 mEq / L 75 mL/hr at 01/12/13 0855     Levon Hedger MS, RD, LDN [phone] Pager [phone] After Hours Pager

## 2013-01-12 NOTE — Progress Notes (Addendum)
PT Cancellation Note  ___Treatment cancelled today due to medical issues with patient which prohibited therapy  ___ Treatment cancelled today due to patient receiving procedure or test   ___ Treatment cancelled today due to patient's refusal to participate   _X_ Pt declined this AM due to poor sleep last night.  Will return as schedule permits.   Felecia Shelling  PTA [facility]  Acute  Rehab Pager      [phone]

## 2013-01-12 NOTE — Progress Notes (Signed)
PHARMACY BRIEF NOTE:  CONVERSION OF PROTONIX FROM IV TO PO  This patient is receiving IV Protonix. Based on criteria approved by the Pharmacy and Therapeutics Committee, this medication is being converted to the equivalent oral dosage form. These criteria include:   . The patient is eating (either orally or per tube) and/or has been taking other orally administered medications for at least 24 hours.  . This patient has no evidence of active gastrointestinal bleeding or impaired GI absorption (gastrectomy, short bowel, patient on TNA or NPO).   If you have questions about this conversion, please contact the pharmacy department.  Thank you,  Elie Goody, PharmD, BCPS Pager: [phone] 01/12/2013  12:09 PM

## 2013-01-12 NOTE — Progress Notes (Signed)
Clinical Social Work  Per PA, patient is not ready to DC today but possibly tomorrow. CSW met with patient at bedside who confirms she would prefer to return to [facility] at DC. CSW updated SNF on DC plans and they are agreeable to accept patient tomorrow if she is medically stable. CSW will continue to follow.  Unk Lightning, LCSW (Coverage for [employer])

## 2013-01-13 MED ORDER — BOOST / RESOURCE BREEZE PO LIQD: 1.0000 | Freq: Two times a day (BID) | ORAL | Status: DC

## 2013-01-13 MED ORDER — HYDROCODONE-ACETAMINOPHEN 5-325 MG PO TABS: 1.0000 | ORAL_TABLET | Freq: Four times a day (QID) | ORAL | Status: DC | PRN

## 2013-01-13 MED ORDER — TRAMADOL HCL 50 MG PO TABS: 50.0000 mg | ORAL_TABLET | Freq: Four times a day (QID) | ORAL | Status: DC | PRN

## 2013-01-13 NOTE — Progress Notes (Signed)
Physical Therapy Treatment Patient Details Name: [name] MRN: [id number] DOB: 04-May-1956 Today's Date: 01/13/2013 Time: 1478-2956 PT Time Calculation (min): 17 min  PT Assessment / Plan / Recommendation  History of Present Illness Pt underewent laporoscopic lysis of adhesions for SBO 7/ 15/14. Hx of RA, Marfan's, and pt is blind   Clinical Impression    PT Comments   Assisted pt to EOB at [facility].  Stood pt to amb when colostomy bag dislodged.  Returned to supine position.    Follow Up Recommendations  SNF     Does the patient have the potential to tolerate intense rehabilitation     Barriers to Discharge        Equipment Recommendations       Recommendations for Other Services    Frequency Min 3X/week   Progress towards PT Goals Progress towards PT goals: Progressing toward goals  Plan Current plan remains appropriate    Precautions / Restrictions Precautions Precautions: Fall Precaution Comments: Pt is blind, NG tube,  ostomy, multiple skeletal deformities. 7/18 R knee buckling-pt states she sometimes wears brace (brace is not here at hospital) Required Braces or Orthoses: Other Brace/Splint Other Brace/Splint: Pt has knee brace that is at home for R knee that was buckling during ambulatin today. Restrictions Weight Bearing Restrictions: No    Pertinent Vitals/Pain No c/o pain    Mobility  Bed Mobility Bed Mobility: Supine to Sit;Sit to Supine Rolling Right: 5: Supervision Rolling Left: 5: Supervision Right Sidelying to Sit: 5: Supervision Supine to Sit: 4: Min assist Sitting - Scoot to Edge of Bed: 4: Min assist;With rail Sit to Supine: 3: Mod assist Sit to Sidelying Right: 5: Supervision Details for Bed Mobility Assistance: increased time and limited use of B hands (deformity) Transfers Transfers: Sit to Stand;Stand to Sit Sit to Stand: 4: Min assist;With upper extremity assist;From bed;From chair/3-in-1 (several times for hygiene) Stand to  Sit: 4: Min guard Details for Transfer Assistance: assist to rise and also control descent. Verbal cues for hand placemet.  Performed several times for hygiene. Ambulation/Gait Ambulation/Gait Assistance Details: Unable to attempt amb 2nd Colostomy Bag came loose and spilled out all over.     PT Goals (current goals can now be found in the care plan section) Acute Rehab PT Goals Patient Stated Goal: none stated  Visit Information  Last PT Received On: 01/13/13 Assistance Needed: +1 History of Present Illness: Pt underewent laporoscopic lysis of adhesions for SBO 7/ 15/14. Hx of RA, Marfan's, and pt is blind    Subjective Data  Patient Stated Goal: none stated   Cognition  Cognition Arousal/Alertness: Awake/alert Behavior During Therapy: WFL for tasks assessed/performed Overall Cognitive Status: Within Functional Limits for tasks assessed    Balance     End of Session PT - End of Session Equipment Utilized During Treatment: Gait belt   Felecia Shelling  PTA [facility]  Acute  Rehab Pager      [phone]

## 2013-01-13 NOTE — Progress Notes (Signed)
Occupational Therapy Treatment Patient Details Name: [name] MRN: [id number] DOB: December 28, 1955 Today's Date: 01/13/2013 Time: 4098-1191 OT Time Calculation (min): 17 min  OT Assessment / Plan / Recommendation  History of present illness Pt underewent laporoscopic lysis of adhesions for SBO 7/ 15/14. Hx of RA, Marfan's, and pt is blind       OT comments  Pt supposed to d/c SNF today. Practiced transfer to [facility] with emphasis on hand placement and RW safety.  Follow Up Recommendations  SNF    Barriers to Discharge       Equipment Recommendations  None recommended by OT    Recommendations for Other Services    Frequency Min 2X/week   Progress towards OT Goals Progress towards OT goals:  (continuing toward toilet transfer goal but still min assist)  Plan Discharge plan remains appropriate    Precautions / Restrictions Precautions Precautions: Fall Precaution Comments: Pt is blind, NG tube,  ostomy, multiple skeletal deformities. 7/18 R knee buckling-pt states she sometimes wears brace (brace is not here at hospital) Restrictions Weight Bearing Restrictions: No   Pertinent Vitals/Pain No c/o    ADL  Toilet Transfer: Performed;Minimal assistance Toilet Transfer Method: Stand pivot Toilet Transfer Equipment: Bedside commode Toileting - Clothing Manipulation and Hygiene: Performed;Minimal assistance Equipment Used: Rolling walker ADL Comments: Pt not able to lean forward to don shoes. Required assist for sit to stand X2--has difficulty with weight shift and rising. Unsteady with stepping around to bed/BSC. Verbal cues for how close bed and BSC are due to visual limitations.Pt immediately statin she was agreeable to transfer onto commmode but didnt want to stay up or sit in chair afterwards.    OT Diagnosis:    OT Problem List:   OT Treatment Interventions:     OT Goals(current goals can now be found in the care plan section) Acute Rehab OT Goals Patient Stated Goal: none  stated  Visit Information  Last OT Received On: 01/13/13 Assistance Needed: +1 History of Present Illness: Pt underewent laporoscopic lysis of adhesions for SBO 7/ 15/14. Hx of RA, Marfan's, and pt is blind    Subjective Data      Prior Functioning       Cognition  Cognition Arousal/Alertness: Awake/alert Behavior During Therapy: WFL for tasks assessed/performed Overall Cognitive Status: Within Functional Limits for tasks assessed    Mobility  Bed Mobility Bed Mobility: Rolling Right;Right Sidelying to Sit;Sit to Sidelying Right;Sitting - Scoot to Edge of Bed Rolling Right: 5: Supervision Rolling Left: 5: Supervision Right Sidelying to Sit: 5: Supervision Sitting - Scoot to Edge of Bed: 4: Min assist;With rail Sit to Sidelying Right: 5: Supervision Details for Bed Mobility Assistance: difficulty with scooting to EOB. assist to scoot L hip forward. Transfers Transfers: Sit to Stand;Stand to Sit Sit to Stand: 4: Min assist;With upper extremity assist;From bed;From chair/3-in-1 Stand to Sit: 4: Min assist;To bed;To chair/3-in-1 Details for Transfer Assistance: assist to rise and also control descent. Verbal cues for hand placement and where BSC positioned relative to her.    Exercises      Balance     End of Session OT - End of Session Equipment Utilized During Treatment: Rolling walker Activity Tolerance: Patient tolerated treatment well Patient left: in bed;with call bell/phone within reach  GO     [name]  [phone] 01/13/2013, 11:15 AM

## 2013-01-13 NOTE — Discharge Summary (Signed)
Physician Discharge Summary  Patient ID: [name] MRN: [id number] DOB/AGE: 28-Dec-1955 57 y.o.  Admit date: 12/31/2012 Discharge date: 01/13/2013  Admitting Diagnosis: Nausea Abdominal pain Anorexria    Discharge Diagnosis Patient Active Problem List   Diagnosis Date Noted  . SBO (small bowel obstruction) 01/05/2013  . 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 Shock 11/27/2012  . Acute blood loss anemia 11/27/2012  . Hypokalemia 11/27/2012  . HEMATOMA 11/15/2009  . LEG EDEMA 09/26/2009  . RHEUMATOID ARTHRITIS 06/07/2009  . OTITIS EXTERNA 01/10/2009  . CELLULITIS AND ABSCESS OF LEG EXCEPT FOOT 07/22/2008  . ALLERGIC REACTION 03/22/2008  . THYROID NODULE 07/01/2007  . LUNG NODULE 07/01/2007  . OSTEOPOROSIS 05/07/2007  . 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 BLINDNESS, BILATERAL 04/12/2007  . HEMORRHOIDS, INTERNAL 04/12/2007  . GOUTY ARTHROPATHY 01/14/2007  . HYPERLIPIDEMIA 01/07/2007  . HYPERTENSION 01/07/2007  . GERD 01/07/2007  . OSTEOARTHRITIS 01/07/2007  . MARFAN'S SYNDROME 01/07/2007    Consultants none  Procedures 01/06/13 Dr. Karie Soda: LAPAROSCOPIC LYSIS OF ADHESIONS  Enterotomy repair  Hospital Course:  57 y.o. F with Marfan's syndrome and RA who is legally blind. She underwent a emergent Hartman's procedure for perforated diverticulitis ~1 mo ago. Since then she has been at a rehab facility, but was released on Mon. She has not had an appetite since she got home. She then developed some nausea and abd pain. She noticed that her ostomy bag was not filling up any more. She began to get more distended and came to the ED. She denied any fevers.  Evaluation showed a SBO.  She was intially treated with conservative measures. However after five days there had been no improvement on films. No flatus.  She was taken to the OR and underwent the procedure above.  Over the next 7 days her diet was advanced and her  ostomy began working well.  She was felt stable for discharge back to her rehab facility.  Physical exam: VSS afebrile General: NAD, alert Abd: soft, mildly tender, good output in colostomy, +BS, midline wound very superficial and healing well, port sites healing well     Medication List         ACTONEL 150 MG tablet  Generic drug:  risedronate  TAKE 1 TABLET BY MOUTH EVERY 30 DAYS    WITH WATER ON EMPTY STOMACH, NOTHING BY MOUTH OR LIE DOWN FOR NEXT 30 MINUTES     albuterol 108 (90 BASE) MCG/ACT inhaler  Commonly known as:  PROVENTIL HFA;VENTOLIN HFA  Inhale 2 puffs into the lungs every 4 (four) hours as needed for wheezing or shortness of breath.     allopurinol 100 MG tablet  Commonly known as:  ZYLOPRIM  TAKE ONE TABLET BY MOUTH ONCE DAILY     amLODipine 10 MG tablet  Commonly known as:  NORVASC  TAKE ONE (1) TABLET EACH DAY     CALCIUM + D PO  Take 1,200 mg by mouth daily.     cetirizine 10 MG tablet  Commonly known as:  ZYRTEC  TAKE ONE (1) TABLET EACH DAY     cyclobenzaprine 10 MG tablet  Commonly known as:  FLEXERIL  TAKE ONE (1) TABLET THREE (3) TIMES     A DAY IF NEEDED     docusate sodium 100 MG capsule  Commonly known as:  COLACE  Take 100 mg by mouth 2 (two) times daily.     esomeprazole 40 MG capsule  Commonly known as:  NEXIUM  Take 1 capsule (40 mg total) by mouth 2 (two) times daily.     feeding supplement Liqd  Take 1 Container by mouth 2 (two) times daily between meals.     HYDROcodone-acetaminophen 5-325 MG per tablet  Commonly known as:  NORCO/VICODIN  Take 1 tablet by mouth every 6 (six) hours as needed for pain.     metoprolol tartrate 25 MG tablet  Commonly known as:  LOPRESSOR  Take 1 tablet (25 mg total) by mouth 2 (two) times daily.     MULTIVITAMIN PO  Take by mouth daily.     predniSONE 10 MG tablet  Commonly known as:  DELTASONE  Take 10 mg by mouth daily.     traMADol 50 MG tablet  Commonly known as:  ULTRAM  Take 1  tablet (50 mg total) by mouth every 6 (six) hours as needed for pain.     VITAMIN B 12 PO  Take 1 tablet by mouth daily.             Follow-up Information   Follow up with GROSS,STEVEN C., MD In 2 weeks.   Contact information:   [address] [place] [address] [phone]       Signed: Denny Levy [facility] [phone]  01/13/2013, 9:00 AM

## 2013-01-13 NOTE — Progress Notes (Signed)
Clinical Social Work  CSW faxed DC summary to [facility] who is agreeable to admission today. CSW informed patient and RN of DC plans. Dtr reports she has already informed family that she was DC today and CSW does not need to call them. CSW prepared DC packet with FL2 and hard scripts included. CSW coordinated transportation via [facility] for 12 pm pick up. Request # [phone]. CSW is signing off but available if needed.  Unk Lightning, LCSW (Coverage for [employer])

## 2013-01-13 NOTE — Progress Notes (Signed)
Pt for d/c to SNF-[facility] today. Dressing CDI-changed to mid abdomen. Colostomy bag changed d/t leakage. Picc line d/c'd per IV team. [facility] to transport pt to SNF. Cellphone & personal belongings with pt on d/c. D/C instructions discussed with pt with verbalized understanding.

## 2013-01-14 ENCOUNTER — Telehealth (INDEPENDENT_AMBULATORY_CARE_PROVIDER_SITE_OTHER): Payer: Self-pay

## 2013-01-14 NOTE — Telephone Encounter (Signed)
LMOM for pt to call me. I want to talk to her about an appt that she needs to schedule with Dr Michaell Cowing in 2-3 wks b/c I see she has an appt tomorrow with Dr Biagio Quint on 01/15/13. I know the pt had surgery with Dr Biagio Quint in June so that is why she has a f/u appt tomorrow but Dr Biagio Quint might check her tomorrow and say she doesn't need to see Dr Michaell Cowing. I did notify Neysa Bonito so she will let me know tomorrow wether or not the pt needs a f/u with Dr Michaell Cowing.

## 2013-01-15 ENCOUNTER — Encounter (INDEPENDENT_AMBULATORY_CARE_PROVIDER_SITE_OTHER): Payer: Self-pay | Admitting: General Surgery

## 2013-01-15 ENCOUNTER — Ambulatory Visit (INDEPENDENT_AMBULATORY_CARE_PROVIDER_SITE_OTHER): Payer: Medicare Other | Admitting: General Surgery

## 2013-01-15 VITALS — BP 132/72 | HR 94 | Temp 98.9°F | Resp 16 | Ht 68.0 in | Wt 134.2 lb

## 2013-01-15 DIAGNOSIS — Z4889 Encounter for other specified surgical aftercare: Secondary | ICD-10-CM

## 2013-01-15 DIAGNOSIS — Z5189 Encounter for other specified aftercare: Secondary | ICD-10-CM

## 2013-01-15 NOTE — Progress Notes (Signed)
Subjective:     Patient ID: [name], female   DOB: 1956-02-26, 57 y.o.   MRN: [id number]  HPI This patient is known to me for exploratory laparotomy and sigmoid colectomy with [facility] procedure for perforated diverticulitis. She had wound complications postoperatively but her midline wound is healing nicely now with healthy granulation tissue.  She is still at the nursing facility and is receiving much better ostomy care now. She's not have any leaking from her ostomy and her only complaint is lack of appetite. She is able to be enterostomies functioning she has some occasional nausea is not vomiting. She did have a recent diagnostic laparoscopy and lyse adhesions due to what sounds like an adhesive bowel obstruction but this seems to be improved now. She remains on steroids.  Review of Systems     Objective:   Physical Exam Her abdomen is soft and nontender and exam of her midline wound is almost healed in the upper portion only has a 1 cm area of granulation tissue. At the lower aspect of the wound she has an area of 4 cm wide of of healthy-appearing granulation tissue and there is no evidence of infection the granulation tissue is flush with the normal skin. Her ostomy appears to be functioning fine and there is no evidence of any parastomal hernia. Her skin looks much better. There is no evidence of any leakage.    Assessment:     Status post sigmoid colectomy for perforated diverticulitis-improving Status post laparoscopic lysis of adhesions for adhesive bowel obstruction-improving I think that she seems to be doing much better. Her ostomy seems to be functioning well and she is tolerating regular diet. I think some of her decreased appetite is not uncommon for this early postoperatively and this should continue to improve. She has some occasional nausea of her but really does not have any sign of obstructive symptoms and no evidence of recurrent obstruction. I recommended that she  continue with her current wound care and I will place a referral for a her to be evaluated in the wound clinic for possible skin grafting versus continued wound care.     Plan:     Wound care clinic evaluation for management of her midline wound  Continuous multivitamins in nutritional supplementation Continued ostomy care. I would not recommend any ostomy reversal

## 2013-01-19 ENCOUNTER — Encounter (HOSPITAL_BASED_OUTPATIENT_CLINIC_OR_DEPARTMENT_OTHER): Payer: Medicare Other | Attending: General Surgery

## 2013-01-19 DIAGNOSIS — Z933 Colostomy status: Secondary | ICD-10-CM | POA: Insufficient documentation

## 2013-01-19 DIAGNOSIS — Q8740 Marfan's syndrome, unspecified: Secondary | ICD-10-CM | POA: Insufficient documentation

## 2013-01-19 DIAGNOSIS — I10 Essential (primary) hypertension: Secondary | ICD-10-CM | POA: Insufficient documentation

## 2013-01-19 DIAGNOSIS — Z79899 Other long term (current) drug therapy: Secondary | ICD-10-CM | POA: Insufficient documentation

## 2013-01-19 DIAGNOSIS — Y836 Removal of other organ (partial) (total) as the cause of abnormal reaction of the patient, or of later complication, without mention of misadventure at the time of the procedure: Secondary | ICD-10-CM | POA: Insufficient documentation

## 2013-01-19 DIAGNOSIS — T8189XA Other complications of procedures, not elsewhere classified, initial encounter: Secondary | ICD-10-CM | POA: Insufficient documentation

## 2013-01-19 DIAGNOSIS — M109 Gout, unspecified: Secondary | ICD-10-CM | POA: Insufficient documentation

## 2013-01-19 DIAGNOSIS — H548 Legal blindness, as defined in USA: Secondary | ICD-10-CM | POA: Insufficient documentation

## 2013-01-19 NOTE — Progress Notes (Signed)
[facility]  NAME:  [name], DULING NO.:  [id number]  MEDICAL RECORD NO.:  [id number]      DATE OF BIRTH:  01-01-1956  PHYSICIAN:  Ardath Sax, M.D.      VISIT DATE:  01/19/2013                                  OFFICE VISIT   This patient is a 57 year old lady who has Marfan syndrome.  She is also legally blind and she is sent here because of an nonhealing surgical wound following a colon resection for diverticulitis.  She has a colostomy.  She also has a history of hypertension.  Her medicines include cyclobenzaprine, magnesium, vitamins, metoprolol, prednisone 10 mg a day, tramadol p.r.n., vitamin B12 shots.  She also takes allopurinol for gout.  She apparently was operated on about a month ago for diverticulitis and now has a wound in the midline of her abdomen that is about 5 cm x 2 cm with good clean granulation tissue.  She has a functioning colostomy in the left side of her abdomen.  Her blood pressure is 166/86, respiration is 16, pulse 68, temperature 98.1.  She weighs 131 pounds.  I am going to treat this wound with Silvercel dressings three times a week.  She will come back here in a week.  DIAGNOSES: 1. Nonhealing surgical wound following a colectomy. 2. Marfan syndrome. 3. Legal blindness. 4. Gout. 5 .  Hypertension.  Thank you.     Ardath Sax, M.D.     PP/MEDQ  D:  01/19/2013  T:  01/19/2013  Job:  [id number]

## 2013-01-21 ENCOUNTER — Telehealth (INDEPENDENT_AMBULATORY_CARE_PROVIDER_SITE_OTHER): Payer: Self-pay

## 2013-01-21 NOTE — Telephone Encounter (Signed)
LMOM I spoke to Dr Michaell Cowing about wether the pt needed an appt with him since the pt just saw Dr Biagio Quint last week for f/u. Per Dr Michaell Cowing the pt just needs one surgeon following her for surgery. I advised pt that I was not going to make her a f/u appt with Dr Michaell Cowing since she just saw Dr Biagio Quint unless if she just wanted to see Dr Michaell Cowing then she would need to call back to schedule.

## 2013-01-21 NOTE — Telephone Encounter (Signed)
Message copied by Ethlyn Gallery on Wed Jan 21, 2013  9:06 AM ------      Message from: Isaias Sakai K      Created: Thu Jan 15, 2013  3:00 PM      Regarding: Dr Leanne Chang: [phone]       Need 1st po for sx on 01/06/13. Per patient needs to see Dr Michaell Cowing ------

## 2013-01-26 ENCOUNTER — Encounter (HOSPITAL_BASED_OUTPATIENT_CLINIC_OR_DEPARTMENT_OTHER): Payer: Medicare Other | Attending: General Surgery

## 2013-01-26 DIAGNOSIS — L98499 Non-pressure chronic ulcer of skin of other sites with unspecified severity: Secondary | ICD-10-CM | POA: Insufficient documentation

## 2013-01-26 NOTE — Addendum Note (Signed)
Addendum created 01/26/13 1117 by Azell Der, MD   Modules edited: Anesthesia Events

## 2013-02-09 ENCOUNTER — Encounter: Payer: Self-pay | Admitting: Family Medicine

## 2013-02-09 ENCOUNTER — Telehealth: Payer: Self-pay

## 2013-02-09 ENCOUNTER — Ambulatory Visit (INDEPENDENT_AMBULATORY_CARE_PROVIDER_SITE_OTHER): Payer: Medicare Other | Admitting: Family Medicine

## 2013-02-09 VITALS — BP 130/80 | HR 78 | Temp 98.3°F

## 2013-02-09 DIAGNOSIS — M069 Rheumatoid arthritis, unspecified: Secondary | ICD-10-CM

## 2013-02-09 DIAGNOSIS — M1990 Unspecified osteoarthritis, unspecified site: Secondary | ICD-10-CM

## 2013-02-09 DIAGNOSIS — K5720 Diverticulitis of large intestine with perforation and abscess without bleeding: Secondary | ICD-10-CM

## 2013-02-09 DIAGNOSIS — K5732 Diverticulitis of large intestine without perforation or abscess without bleeding: Secondary | ICD-10-CM

## 2013-02-09 DIAGNOSIS — I10 Essential (primary) hypertension: Secondary | ICD-10-CM

## 2013-02-09 MED ORDER — HYDROCODONE-ACETAMINOPHEN 10-325 MG PO TABS: 1.0000 | ORAL_TABLET | Freq: Four times a day (QID) | ORAL | Status: DC | PRN

## 2013-02-09 MED ORDER — PREDNISONE 5 MG PO TABS: 5.0000 mg | ORAL_TABLET | Freq: Every day | ORAL | Status: DC

## 2013-02-09 MED ORDER — MEGESTROL ACETATE 625 MG/5ML PO SUSP: 625.0000 mg | Freq: Two times a day (BID) | ORAL | Status: DC

## 2013-02-09 NOTE — Progress Notes (Signed)
[facility]  NAME:  [name], [name]                    ACCOUNT NO.:  MEDICAL RECORD NO.:  [id number]      DATE OF BIRTH:  1956-03-25  PHYSICIAN:  Wayland Denis, DO       VISIT DATE:  02/09/2013                                  OFFICE VISIT   HISTORY OF PRESENT ILLNESS:  The patient is a 57 year old female, who is here for evaluation of a nonhealing surgical wound of her abdomen.  She had colon resection due to diverticulitis with colostomy formation and she had difficulty healing the abdominal wound since that time.  She has been using Silvercel dressings on it for the last couple of weeks.  PAST MEDICAL HISTORY:  Positive for hypertension, irritable bowel, diverticulitis, Marfan's, gout, scoliosis, goiter, colostomy, muscle weakness, and legally blind.  PAST SURGICAL HISTORY:  Bowel obstruction with bowel resection, neck surgery, and exploratory laparoscopy.  MEDICATIONS:  Actonel, allopurinol, amlodipine, calcium, cyclobenzaprine, docusate, magnesium, hydrocodone, metoprolol, multivitamin, prednisone, tramadol, vitamin B12.  ALLERGIES:  She does not have any allergies.  SOCIAL HISTORY:  She has good family support.  She is not smoking.  REVIEW OF SYSTEMS:  Otherwise negative.  PHYSICAL EXAMINATION:  GENERAL:  She is alert and cooperative.  She is pleasant for the exam.  Her glasses are in place HEENT:  She has asymmetry of her eyes and face. NECK:  No cervical lymphadenopathy. LUNGS:  Her breathing is unlabored. HEART:  Rate is regular. ABDOMEN:  Soft.  IMPRESSION AND PLAN:  She has a ulcer that is 4.2 x 1.7 x 0.1 cm on the abdominal area on the lower portion of the incision medial to the colostomy and it has a home actually granulated well with a little bit of hypergranulation and it does not appear to be infected.  The periwound area is cleaned and she does not have tenderness.  The silver nitrate was used to help with the hypergranulation and  recommend continuing with the silver alginate and applying for OASIS.  In the meantime, we will check a prealbumin.  Recommend multivitamin, vitamin C, zinc, and shower daily.     Wayland Denis, DO     CS/MEDQ  D:  02/09/2013  T:  02/09/2013  Job:  [id number]

## 2013-02-09 NOTE — Telephone Encounter (Signed)
Britta Mccreedy from [facility] called to get verbal order to see pt for wound care. They have orders from the surgeon but needed verbal from PCP to see pt. Ok to see pt, per Dr. Clent Ridges. They will be seeing her 3x weekly for 3 weeks then prn for dressing changes.   Dr. Clent Ridges is  aware

## 2013-02-09 NOTE — Progress Notes (Signed)
  Subjective:    Patient ID: [name], female    DOB: 1956/01/15, 57 y.o.   MRN: [id number]  HPI Here to follow up after a hospital stay from 12-31-12 to 01-13-13 for a SBO secondary to adhesions from her prior colectomy in June to treat a perforated diverticulitis. She underwent laparoscopic take down of these adhesions, and she has done well since then. She is passing stool into her colostomy bag regularly. She has seen Surgery several times since her last DC, and they have told her she only needs to see them prn at this point. She is seeing the [facility] Clinic once a week. She denies any pain or nausea, but she complains of a very poor appetite. She has been on prednisone for several years to help with her joint pains, but this was felt to have contributed to her colon perforation. He dose was lowered from 20 mg a day to 10 mg over the past month.    Review of Systems  Constitutional: Positive for appetite change. Negative for fever.  Respiratory: Negative.   Cardiovascular: Negative.   Gastrointestinal: Negative.   Musculoskeletal: Positive for back pain and arthralgias.       Objective:   Physical Exam  Constitutional: She appears well-developed and well-nourished.  [name]   Cardiovascular: Normal rate, regular rhythm, normal heart sounds and intact distal pulses.   Pulmonary/Chest: Effort normal and breath sounds normal.  Abdominal: Soft. Bowel sounds are normal. She exhibits no distension and no mass. There is no tenderness. There is no rebound and no guarding.  Her colostomy site looks clear           Assessment & Plan:  She seems to have recovered well from the abdominal procedures with no signs of an obstruction. I agree with stopping the prednisone, so she will take only 5 mg daily for two weeks and then stop it altogether. I will increase the dose of her narcotic meds to compensate for this so we can control her joint pains. Try Megace to improve her appetite.

## 2013-02-10 ENCOUNTER — Telehealth: Payer: Self-pay | Admitting: Family Medicine

## 2013-02-10 NOTE — Telephone Encounter (Signed)
Pt states the scripts from her visit yesterday are not at pharm. Can you pls call these in for pt? predniSONE (DELTASONE) 5 MG tablet HYDROcodone-acetaminophen (NORCO) 10-325 MG per tablet megestrol (MEGACE ES) 625 MG/5ML suspension [facility]/ [address] [place]

## 2013-02-10 NOTE — Telephone Encounter (Signed)
Rx called into pharmacy.  Will be delivered to patient tomorrow.

## 2013-02-12 ENCOUNTER — Telehealth: Payer: Self-pay

## 2013-02-12 NOTE — Telephone Encounter (Signed)
Would like an order to have pt evaluated for occupational and physical therapy.  She also wanted an order to allow them to follow wound care orders. I advised Britta Mccreedy that as told to her earlier this week, per Dr. Clent Ridges, wound care orders should come from the wound care center.  Advised her that I would send message to Dr. Clent Ridges for the therapy order for generalized weakness. Ok for pt to have evaluation for therapy?

## 2013-02-13 NOTE — Telephone Encounter (Signed)
Plea give the orders for PT and OT

## 2013-02-13 NOTE — Telephone Encounter (Signed)
[name] aware.

## 2013-02-16 NOTE — Progress Notes (Signed)
[facility]  NAME:  [name], [name] NO.:  [id number]  MEDICAL RECORD NO.:  [id number]      DATE OF BIRTH:  Aug 24, 1955  PHYSICIAN:  Wayland Denis, DO            VISIT DATE:                                  OFFICE VISIT   The patient is a 57 year old female who is here for followup on her abdominal ulcer.  She is doing very well.  She is here with family.  The area is improving in size.  She has been using the Aquacel Ag with some improvement.  She is also getting better at her diet.  Home health is coming 4 days a week at present.  SOCIAL HISTORY:  She is now at home, living with family.  REVIEW OF SYSTEMS:  Otherwise negative.  PHYSICAL EXAMINATION:  She is alert, oriented, cooperative, very pleasant.  Her glasses are on.  She does not have vision.  Her cervical lymph nodes are benign.  Her breathing is unlabored.  Her heart rate is regular.  The colostomy is in place.  The abdominal ulcer is clean with no periwound breakdown.  We will continue with Aquacel Ag.  We will likely be able to use the waist due to her deductible and not of it being med at present, so we will continue with the Aquacel Ag and see her back in 2 weeks.  We will also switch to 3 times a week dressing changes.     Wayland Denis, DO     CS/MEDQ  D:  02/16/2013  T:  02/16/2013  Job:  [id number]

## 2013-02-17 ENCOUNTER — Telehealth: Payer: Self-pay | Admitting: Family Medicine

## 2013-02-17 NOTE — Telephone Encounter (Signed)
Okay for this order? °

## 2013-02-17 NOTE — Telephone Encounter (Signed)
Joleen occupational therapist is requesting order for OT twice a wk for several weeks for self care. Please call with verbal order

## 2013-02-17 NOTE — Telephone Encounter (Signed)
Okay this order  

## 2013-02-17 NOTE — Telephone Encounter (Signed)
[name] physical therapist is calling needs PT order for this patient. Pt needs physical therapy twice a wk for 4 wks and start date is 02-17-13 for strengthening,balancing and gait. Pt call with verbal order

## 2013-02-18 NOTE — Telephone Encounter (Signed)
I spoke with OT and gave verbal order.

## 2013-02-18 NOTE — Telephone Encounter (Signed)
I spoke with PT and gave verbal order.

## 2013-02-23 ENCOUNTER — Other Ambulatory Visit: Payer: Self-pay | Admitting: Family Medicine

## 2013-02-23 ENCOUNTER — Telehealth (INDEPENDENT_AMBULATORY_CARE_PROVIDER_SITE_OTHER): Payer: Self-pay | Admitting: General Surgery

## 2013-02-23 NOTE — Telephone Encounter (Signed)
Pt's RN called stating she is having some nausea and vomited once last night.  RN states good vitals and good bowel sounds. There is air and stool in her ostomy bag.  Recommended continuing liquids today and we would check in on her tomorrow.  She will call the office if symptoms worsen or unable to tolerate liquids.

## 2013-02-24 ENCOUNTER — Telehealth: Payer: Self-pay | Admitting: Family Medicine

## 2013-02-24 NOTE — Telephone Encounter (Signed)
PT from [facility] is calling to request orders to see the patient once this week and once next week. Please assist.

## 2013-02-25 ENCOUNTER — Inpatient Hospital Stay (HOSPITAL_COMMUNITY)
Admission: EM | Admit: 2013-02-25 | Discharge: 2013-02-28 | DRG: 389 | Disposition: A | Discharge status: Home / Self Care | Payer: Medicare Other | Attending: General Surgery | Admitting: General Surgery

## 2013-02-25 ENCOUNTER — Emergency Department (HOSPITAL_COMMUNITY): Payer: Medicare Other

## 2013-02-25 ENCOUNTER — Encounter (HOSPITAL_COMMUNITY): Payer: Self-pay | Admitting: Emergency Medicine

## 2013-02-25 DIAGNOSIS — I10 Essential (primary) hypertension: Secondary | ICD-10-CM | POA: Diagnosis present

## 2013-02-25 DIAGNOSIS — Q8740 Marfan's syndrome, unspecified: Secondary | ICD-10-CM

## 2013-02-25 DIAGNOSIS — Z8673 Personal history of transient ischemic attack (TIA), and cerebral infarction without residual deficits: Secondary | ICD-10-CM

## 2013-02-25 DIAGNOSIS — Z96659 Presence of unspecified artificial knee joint: Secondary | ICD-10-CM

## 2013-02-25 DIAGNOSIS — Z9049 Acquired absence of other specified parts of digestive tract: Secondary | ICD-10-CM

## 2013-02-25 DIAGNOSIS — Z933 Colostomy status: Secondary | ICD-10-CM

## 2013-02-25 DIAGNOSIS — M1A00X0 Idiopathic chronic gout, unspecified site, without tophus (tophi): Secondary | ICD-10-CM | POA: Diagnosis present

## 2013-02-25 DIAGNOSIS — K56609 Unspecified intestinal obstruction, unspecified as to partial versus complete obstruction: Principal | ICD-10-CM | POA: Diagnosis present

## 2013-02-25 DIAGNOSIS — M1A9XX0 Chronic gout, unspecified, without tophus (tophi): Secondary | ICD-10-CM | POA: Diagnosis present

## 2013-02-25 DIAGNOSIS — Z87891 Personal history of nicotine dependence: Secondary | ICD-10-CM

## 2013-02-25 DIAGNOSIS — IMO0002 Reserved for concepts with insufficient information to code with codable children: Secondary | ICD-10-CM

## 2013-02-25 DIAGNOSIS — Z8719 Personal history of other diseases of the digestive system: Secondary | ICD-10-CM

## 2013-02-25 DIAGNOSIS — H548 Legal blindness, as defined in USA: Secondary | ICD-10-CM | POA: Diagnosis present

## 2013-02-25 DIAGNOSIS — M810 Age-related osteoporosis without current pathological fracture: Secondary | ICD-10-CM | POA: Diagnosis present

## 2013-02-25 DIAGNOSIS — Z79899 Other long term (current) drug therapy: Secondary | ICD-10-CM

## 2013-02-25 LAB — COMPREHENSIVE METABOLIC PANEL
ALT: 16 U/L (ref 0–35)
AST: 19 U/L (ref 0–37)
Albumin: 3.9 g/dL (ref 3.5–5.2)
Alkaline Phosphatase: 79 U/L (ref 39–117)
BUN: 18 mg/dL (ref 6–23)
CO2: 28 mEq/L (ref 19–32)
Calcium: 9.9 mg/dL (ref 8.4–10.5)
Chloride: 100 mEq/L (ref 96–112)
Creatinine, Ser: 0.79 mg/dL (ref 0.50–1.10)
GFR calc Af Amer: >90 mL/min (ref >90)
GFR calc non Af Amer: >90 mL/min (ref >90)
Glucose, Bld: 109 mg/dL — ABNORMAL HIGH (ref 70–99)
Potassium: 3.5 mEq/L (ref 3.5–5.1)
Sodium: 140 mEq/L (ref 135–145)
Total Bilirubin: 0.5 mg/dL (ref 0.3–1.2)
Total Protein: 8.2 g/dL (ref 6.0–8.3)

## 2013-02-25 LAB — URINALYSIS, ROUTINE W REFLEX MICROSCOPIC
Bilirubin Urine: NEGATIVE
Glucose, UA: NEGATIVE mg/dL
Hgb urine dipstick: NEGATIVE
Ketones, ur: NEGATIVE mg/dL
Nitrite: NEGATIVE
Protein, ur: NEGATIVE mg/dL
Specific Gravity, Urine: 1.023 (ref 1.005–1.030)
Urobilinogen, UA: 1 mg/dL (ref 0.0–1.0)
pH: 8 (ref 5.0–8.0)

## 2013-02-25 LAB — CBC WITH DIFFERENTIAL/PLATELET
Basophils Absolute: 0 10*3/uL (ref 0.0–0.1)
Basophils Relative: 0 % (ref 0–1)
Eosinophils Absolute: 0.1 10*3/uL (ref 0.0–0.7)
Eosinophils Relative: 1 % (ref 0–5)
HCT: 35.7 % — ABNORMAL LOW (ref 36.0–46.0)
Hemoglobin: 11.2 g/dL — ABNORMAL LOW (ref 12.0–15.0)
Lymphocytes Relative: 16 % (ref 12–46)
Lymphs Abs: 1.9 10*3/uL (ref 0.7–4.0)
MCH: 27.1 pg (ref 26.0–34.0)
MCHC: 31.4 g/dL (ref 30.0–36.0)
MCV: 86.2 fL (ref 78.0–100.0)
Monocytes Absolute: 0.6 10*3/uL (ref 0.1–1.0)
Monocytes Relative: 5 % (ref 3–12)
Neutro Abs: 9.5 10*3/uL — ABNORMAL HIGH (ref 1.7–7.7)
Neutrophils Relative %: 79 % — ABNORMAL HIGH (ref 43–77)
Platelets: 375 10*3/uL (ref 150–400)
RBC: 4.14 MIL/uL (ref 3.87–5.11)
RDW: 17.4 % — ABNORMAL HIGH (ref 11.5–15.5)
WBC: 12 10*3/uL — ABNORMAL HIGH (ref 4.0–10.5)

## 2013-02-25 LAB — URINE MICROSCOPIC-ADD ON

## 2013-02-25 LAB — LIPASE, BLOOD: Lipase: 71 U/L — ABNORMAL HIGH (ref 11–59)

## 2013-02-25 MED ORDER — IOHEXOL 300 MG/ML  SOLN
80.0000 mL | Freq: Once | INTRAMUSCULAR | Status: AC | PRN
  Administered 2013-02-25: 80 mL via INTRAVENOUS

## 2013-02-25 MED ORDER — ONDANSETRON 8 MG PO TBDP
8.0000 mg | ORAL_TABLET | Freq: Once | ORAL | Status: AC
  Administered 2013-02-25: 8 mg via ORAL
  Filled 2013-02-25: qty 1

## 2013-02-25 MED ORDER — IOHEXOL 300 MG/ML  SOLN
50.0000 mL | Freq: Once | INTRAMUSCULAR | Status: AC | PRN
  Administered 2013-02-25: 50 mL via ORAL

## 2013-02-25 MED ORDER — MORPHINE SULFATE 4 MG/ML IJ SOLN
4.0000 mg | Freq: Once | INTRAMUSCULAR | Status: AC
  Administered 2013-02-25: 4 mg via INTRAVENOUS
  Filled 2013-02-25: qty 1

## 2013-02-25 MED ORDER — ONDANSETRON HCL 4 MG/2ML IJ SOLN
4.0000 mg | Freq: Once | INTRAMUSCULAR | Status: AC
  Administered 2013-02-25: 4 mg via INTRAVENOUS
  Filled 2013-02-25: qty 2

## 2013-02-25 NOTE — Telephone Encounter (Signed)
Call in #120 with 5 rf 

## 2013-02-25 NOTE — Telephone Encounter (Signed)
Okay for these orders.

## 2013-02-25 NOTE — Telephone Encounter (Signed)
I spoke with [name] and gave verbal order.

## 2013-02-25 NOTE — ED Notes (Signed)
Pt states that she began feeling nauseous and vomiting 3 days ago. Pt states it went away and then came back today around 1400. Pt in NAD. Skin pwd.

## 2013-02-26 ENCOUNTER — Encounter (HOSPITAL_COMMUNITY): Payer: Self-pay

## 2013-02-26 ENCOUNTER — Emergency Department (HOSPITAL_COMMUNITY): Payer: Medicare Other

## 2013-02-26 MED ORDER — ONDANSETRON HCL 4 MG/2ML IJ SOLN: 4.0000 mg | Freq: Four times a day (QID) | INTRAMUSCULAR | Status: DC | PRN

## 2013-02-26 MED ORDER — HYDROCORTISONE SOD SUCCINATE 100 MG IJ SOLR
50.0000 mg | Freq: Every day | INTRAMUSCULAR | Status: DC
  Administered 2013-02-26 – 2013-02-28 (×3): 50 mg via INTRAVENOUS
  Filled 2013-02-26 (×3): qty 1

## 2013-02-26 MED ORDER — LIDOCAINE HCL 2 % EX GEL
CUTANEOUS | Status: AC
  Administered 2013-02-26: 02:00:00
  Filled 2013-02-26: qty 10

## 2013-02-26 MED ORDER — HEPARIN SODIUM (PORCINE) 5000 UNIT/ML IJ SOLN
5000.0000 [IU] | Freq: Three times a day (TID) | INTRAMUSCULAR | Status: DC
  Administered 2013-02-26 – 2013-02-28 (×8): 5000 [IU] via SUBCUTANEOUS
  Filled 2013-02-26 (×10): qty 1

## 2013-02-26 MED ORDER — PANTOPRAZOLE SODIUM 40 MG IV SOLR
40.0000 mg | Freq: Every day | INTRAVENOUS | Status: DC
  Administered 2013-02-26 – 2013-02-27 (×2): 40 mg via INTRAVENOUS
  Filled 2013-02-26 (×3): qty 40

## 2013-02-26 MED ORDER — KCL IN DEXTROSE-NACL 20-5-0.9 MEQ/L-%-% IV SOLN
INTRAVENOUS | Status: DC
  Administered 2013-02-26 – 2013-02-27 (×4): via INTRAVENOUS
  Administered 2013-02-28: 100 mL/h via INTRAVENOUS
  Filled 2013-02-26 (×8): qty 1000

## 2013-02-26 MED ORDER — MORPHINE SULFATE 2 MG/ML IJ SOLN
2.0000 mg | INTRAMUSCULAR | Status: DC | PRN
  Administered 2013-02-27 – 2013-02-28 (×6): 2 mg via INTRAVENOUS
  Filled 2013-02-26 (×6): qty 1

## 2013-02-26 MED ORDER — ALBUTEROL SULFATE HFA 108 (90 BASE) MCG/ACT IN AERS
2.0000 | INHALATION_SPRAY | RESPIRATORY_TRACT | Status: DC | PRN
  Filled 2013-02-26: qty 6.7

## 2013-02-26 MED ORDER — ENALAPRILAT 1.25 MG/ML IV SOLN
0.6250 mg | Freq: Four times a day (QID) | INTRAVENOUS | Status: DC | PRN
  Filled 2013-02-26: qty 0.5

## 2013-02-26 NOTE — Progress Notes (Signed)
Patient ID: [name], female   DOB: Jun 29, 1955, 57 y.o.   MRN: [id number]    Subjective: Pt reports feeling much better today, hasn't had n/v since last night, min to no pain now, +output in ostomy, family in room  Objective: Vital signs in last 24 hours: Temp:  [98 F (36.7 C)-99.5 F (37.5 C)] 98.2 F (36.8 C) (09/04 0531) Pulse Rate:  [70-93] 87 (09/04 0531) Resp:  [18-20] 18 (09/04 0531) BP: (136-179)/(81-101) 148/81 mmHg (09/04 0531) SpO2:  [94 %-98 %] 98 % (09/04 0531) Weight:  [131 lb (59.421 kg)-139 lb 5.3 oz (63.2 kg)] 139 lb 5.3 oz (63.2 kg) (09/04 0100) Last BM Date: 02/26/13  Intake/Output from previous day: 09/03 0701 - 09/04 0700 In: 1221.7 [I.V.:1221.7] Out: 1830 [Urine:1800; Stool:30] Intake/Output this shift: Total I/O In: -  Out: 400 [Urine:400]  PE: Abd: min tender, soft, +BS, ostomy with liquid stool, pink and healthy General: NAD  Lab Results:   Recent Labs  02/25/13 2121  WBC 12.0*  HGB 11.2*  HCT 35.7*  PLT 375   BMET  Recent Labs  02/25/13 2121  NA 140  K 3.5  CL 100  CO2 28  GLUCOSE 109*  BUN 18  CREATININE 0.79  CALCIUM 9.9   PT/INR No results found for this basename: LABPROT, INR,  in the last 72 hours CMP     Component Value Date/Time   NA 140 02/25/2013 2121   K 3.5 02/25/2013 2121   CL 100 02/25/2013 2121   CO2 28 02/25/2013 2121   GLUCOSE 109* 02/25/2013 2121   BUN 18 02/25/2013 2121   CREATININE 0.79 02/25/2013 2121   CALCIUM 9.9 02/25/2013 2121   PROT 8.2 02/25/2013 2121   ALBUMIN 3.9 02/25/2013 2121   AST 19 02/25/2013 2121   ALT 16 02/25/2013 2121   ALKPHOS 79 02/25/2013 2121   BILITOT 0.5 02/25/2013 2121   GFRNONAA >90 02/25/2013 2121   GFRAA >90 02/25/2013 2121   Lipase     Component Value Date/Time   LIPASE 71* 02/25/2013 2121       Studies/Results: Ct Abdomen Pelvis W Contrast  02/26/2013   *RADIOLOGY REPORT*  Clinical Data: Abdominal pain  CT ABDOMEN AND PELVIS WITH CONTRAST  Technique:  Multidetector CT imaging of the  abdomen and pelvis was performed following the standard protocol during bolus administration of intravenous contrast.  Contrast: 80mL OMNIPAQUE IOHEXOL 300 MG/ML  SOLN  Comparison: Prior CT from 12/31/2012.  Findings: The visualized lung bases are clear.  1.8 cm cyst present within the hepatic lobe is unchanged (series 2, image 20). Additional tiny 3 mm hypodensity within the lower segment of the left hepatic lobe is also unchanged, too small to characterize.  The gallbladder is contracted. Spleen and adrenal glands are grossly unremarkable.  There is increased prominence of the main pancreatic duct as compared to the prior examination, now measuring up to 3 mm (series 2, image 27).  The common bile duct is within normal limits measuring approximately 7 mm. The gallbladder is grossly normal without evidence of acute cholecystitis.  Mild intrahepatic biliary dilatation is noted.  The kidneys demonstrate a normal contrast enhanced appearance without evidence of nephrolithiasis or hydronephrosis.  No focal renal masses.  No stones seen along the course of the renal collecting systems.  Diverting colostomy is again noted within the left lower quadrant. Again, proximal small bowel are distended with a large amount of enteric contrast material present within the lumen.  Scattered air fluid levels  are present within several dilated loops of small bowel in the mid and left hemiabdomen.  These measure up to 4.1 cm in diameter.  The small bowel is decompressed distally at the level of the terminal ileum.  Focal transition point is not definitely visualized.  The right colon is somewhat dilated with stool within the lumen. Scattered colonic diverticuli are present without acute diverticulitis.  The bladder and pelvic viscera are within normal limits.  Small amount of free fluid is present within the pelvis.  No free intraperitoneal air.  No pathologically enlarged intra-abdominal pelvic lymph nodes.  Severe scoliosis with  extensive multilevel degenerative disc disease is unchanged.  IMPRESSION: 1. Distension of the stomach and proximal small bowel with decompression of the ileum distally.  Findings are concerning for small bowel obstruction.  These findings are similar as compared to prior CT from 12/31/2012.  No definite focal transition point identified.  2.  Small volume free fluid within the pelvis.   Original Report Authenticated By: Rise Mu, M.D.   Dg Abd Portable 1v  02/26/2013   *RADIOLOGY REPORT*  Clinical Data: Evaluate NG tube placement  PORTABLE ABDOMEN - 1 VIEW  Comparison: CT performed on 02/25/2013  Findings: No enteric tube is visualized on the single image provided.  Multiple mildly prominent gas filled loops of bowel are seen scattered throughout the abdomen, better evaluated on prior CT.  No new abnormality identified.  IMPRESSION: Nonvisualization of enteric tube.  Query malpositioning within the hypopharynx or thorax.   Original Report Authenticated By: Rise Mu, M.D.    Anti-infectives: Anti-infectives   None       Assessment/Plan SBO: better today, no more n/v at this time, will hold off on NGT but if symptoms recur she will need IR to place NGT, ok to ambulate in hall, keep NPO for right now, ?repeat films in am   LOS: 1 day    Malick Netz 02/26/2013

## 2013-02-26 NOTE — ED Notes (Signed)
ngt not in place per x-ray.Per Dr Johna Sheriff remove tube and will place

## 2013-02-26 NOTE — ED Provider Notes (Signed)
CSN: [id number]     Arrival date & time 02/25/13  2054 History   First MD Initiated Contact with Patient 02/25/13 2144     Chief Complaint  Patient presents with  . Abdominal Pain   (Consider location/radiation/quality/duration/timing/severity/associated sxs/prior Treatment) HPI  57 y.o. F with Marfan's syndrome and RA presenting with abdominal pain and n/v. Symptoms onset Monday. Says mild pain around site of colostomy. History of Hartman's procedure for perforated diverticulitis in June. ~5 weeks after this developped SBO which did not respond to conservative management and subsequently had surgery 7/15. Had been doing well up until 2d ago. Began having crampy abdominal pain. N/v. Ostomy outpt has been watery decreased. No fever or chills. No urinary complaints.    Past Medical History  Diagnosis Date  . Legally blind     since pt was a teenager  . Hearing aid worn     pt wears bilateral hearing aids  . Marfan's syndrome affecting skin     with scolosis  . Osteoporosis   . Hypertension   . Substance abuse     recovering alcoholic x12 years   . Hernia 4/08  . 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 Chronic gout 2008  . Stroke 2/08  . Fibroid   . Status post bunionectomy 1/10    bilateral   . Diverticulitis of large intestine with perforation   . Small bowel obstruction due to adhesions    Past Surgical History  Procedure Laterality Date  . Bunionectomy Bilateral 1/10  . Total knee arthroplasty Bilateral 6/08  . Hernia repair    . Laparotomy N/A 11/27/2012    Procedure: EXPLORATORY LAPAROTOMY SIGMOID COLECTOMY, COLOSTOMY;  Surgeon: Lodema Pilot, DO;  Location: [facility];  Service: General;  Laterality: N/A;  . Colostomy    . Laparoscopic abdominal exploration N/A 01/06/2013    Procedure: LAPAROSCOPIC ABDOMINAL EXPLORATION;  Surgeon: Ardeth Sportsman, MD;  Location: [facility];  Service: General;  Laterality: N/A;  . Laparoscopic lysis of adhesions N/A 01/06/2013     Procedure: LAPAROSCOPIC LYSIS OF ADHESIONS/ INTEROTOMY REPAIR;  Surgeon: Ardeth Sportsman, MD;  Location: [facility];  Service: General;  Laterality: N/A;  . Colon surgery      colectomy   No family history on file. History  Substance Use Topics  . Smoking status: Former Smoker -- 1.00 packs/day    Types: Cigarettes    Quit date: 11/27/2012  . Smokeless tobacco: Never Used  . Alcohol Use: No     Comment: recovering alcoholic   OB History   Grav Para Term Preterm Abortions TAB SAB Ect Mult Living   1 1 1       1      Review of Systems  Allergies  Review of patient's allergies indicates no known allergies.  Home Medications   Current Outpatient Rx  Name  Route  Sig  Dispense  Refill  . ACTONEL 150 MG tablet      TAKE 1 TABLET BY MOUTH EVERY 30 DAYS    WITH WATER ON EMPTY STOMACH, NOTHING BY MOUTH OR LIE DOWN FOR NEXT 30 MINUTES   1 tablet   11   . albuterol (PROVENTIL HFA;VENTOLIN HFA) 108 (90 BASE) MCG/ACT inhaler   Inhalation   Inhale 2 puffs into the lungs every 4 (four) hours as needed for wheezing or shortness of breath.   1 Inhaler   5   . allopurinol (ZYLOPRIM) 100 MG tablet  TAKE ONE TABLET BY MOUTH ONCE DAILY   30 tablet   11   . amLODipine (NORVASC) 10 MG tablet      take 1 tablet by mouth once daily   30 tablet   5   . Calcium Carbonate-Vitamin D (CALCIUM + D PO)   Oral   Take 1,200 mg by mouth daily.         . cetirizine (ZYRTEC) 10 MG tablet      TAKE ONE (1) TABLET EACH DAY   30 tablet   11     PLEASE RESPOND   . Cyanocobalamin (VITAMIN B 12 PO)   Oral   Take 1 tablet by mouth daily.         . cyclobenzaprine (FLEXERIL) 10 MG tablet      TAKE ONE (1) TABLET THREE (3) TIMES     A DAY IF NEEDED   270 tablet   11   . docusate sodium (COLACE) 100 MG capsule   Oral   Take 100 mg by mouth 2 (two) times daily.           Marland Kitchen esomeprazole (NEXIUM) 40 MG capsule   Oral   Take 1 capsule (40 mg total) by mouth 2 (two) times daily.    60 capsule   11     PLEASE RESPOND   . feeding supplement (RESOURCE BREEZE) LIQD   Oral   Take 1 Container by mouth 2 (two) times daily between meals.   28 Container   prn   . HYDROcodone-acetaminophen (NORCO) 10-325 MG per tablet   Oral   Take 1 tablet by mouth every 6 (six) hours as needed for pain.   120 tablet   5   . megestrol (MEGACE ES) 625 MG/5ML suspension   Oral   Take 5 mL (625 mg total) by mouth 2 (two) times daily before a meal.   150 mL   5   . Multiple Vitamins-Minerals (MULTIVITAMIN PO)   Oral   Take by mouth daily.         . predniSONE (DELTASONE) 5 MG tablet   Oral   Take 1 tablet (5 mg total) by mouth daily.   30 tablet   0   . traMADol (ULTRAM) 50 MG tablet      take 1 to 2 tablets by mouth every 6 hours if needed for pain   120 tablet   5    BP 149/97  Pulse 70  Temp(Src) 98 F (36.7 C) (Oral)  Resp 18  Ht 5\' 8"  (1.727 m)  Wt 131 lb (59.421 kg)  BMI 19.92 kg/m2  SpO2 94%  LMP 06/25/2006 Physical Exam  Nursing note and vitals reviewed. Constitutional: She appears well-developed and well-nourished. No distress.  HENT:  Head: Normocephalic and atraumatic.  Eyes: Right eye exhibits no discharge. Left eye exhibits no discharge.  Wearing sunglasses  Neck: Neck supple.  Cardiovascular: Normal rate, regular rhythm and normal heart sounds.  Exam reveals no gallop and no friction rub.   No murmur heard. Pulmonary/Chest: Effort normal and breath sounds normal. No respiratory distress.  Abdominal: Soft.  colostomy with scant watery brown stool in bag. Healthy appearing stoma. Mild distension. Mild tenderness upper abdomen w/o rebound or guarding.   Musculoskeletal: She exhibits no edema and no tenderness.  Chronic appearing deformities consistent with hx of RA.  Neurological: She is alert.  Skin: Skin is warm and dry.  Psychiatric: She has a normal mood and affect. Her behavior is  normal. Thought content normal.    ED Course   Procedures (including critical care time) Labs Review Labs Reviewed  CBC WITH DIFFERENTIAL - Abnormal; Notable for the following:    WBC 12.0 (*)    Hemoglobin 11.2 (*)    HCT 35.7 (*)    RDW 17.4 (*)    Neutrophils Relative % 79 (*)    Neutro Abs 9.5 (*)    All other components within normal limits  COMPREHENSIVE METABOLIC PANEL - Abnormal; Notable for the following:    Glucose, Bld 109 (*)    All other components within normal limits  LIPASE, BLOOD - Abnormal; Notable for the following:    Lipase 71 (*)    All other components within normal limits  URINALYSIS, ROUTINE W REFLEX MICROSCOPIC - Abnormal; Notable for the following:    APPearance CLOUDY (*)    Leukocytes, UA SMALL (*)    All other components within normal limits  URINE MICROSCOPIC-ADD ON - Abnormal; Notable for the following:    Bacteria, UA FEW (*)    All other components within normal limits  URINE CULTURE   Imaging Review Ct Abdomen Pelvis W Contrast  02/26/2013   *RADIOLOGY REPORT*  Clinical Data: Abdominal pain  CT ABDOMEN AND PELVIS WITH CONTRAST  Technique:  Multidetector CT imaging of the abdomen and pelvis was performed following the standard protocol during bolus administration of intravenous contrast.  Contrast: 80mL OMNIPAQUE IOHEXOL 300 MG/ML  SOLN  Comparison: Prior CT from 12/31/2012.  Findings: The visualized lung bases are clear.  1.8 cm cyst present within the hepatic lobe is unchanged (series 2, image 20). Additional tiny 3 mm hypodensity within the lower segment of the left hepatic lobe is also unchanged, too small to characterize.  The gallbladder is contracted. Spleen and adrenal glands are grossly unremarkable.  There is increased prominence of the main pancreatic duct as compared to the prior examination, now measuring up to 3 mm (series 2, image 27).  The common bile duct is within normal limits measuring approximately 7 mm. The gallbladder is grossly normal without evidence of acute cholecystitis.   Mild intrahepatic biliary dilatation is noted.  The kidneys demonstrate a normal contrast enhanced appearance without evidence of nephrolithiasis or hydronephrosis.  No focal renal masses.  No stones seen along the course of the renal collecting systems.  Diverting colostomy is again noted within the left lower quadrant. Again, proximal small bowel are distended with a large amount of enteric contrast material present within the lumen.  Scattered air fluid levels are present within several dilated loops of small bowel in the mid and left hemiabdomen.  These measure up to 4.1 cm in diameter.  The small bowel is decompressed distally at the level of the terminal ileum.  Focal transition point is not definitely visualized.  The right colon is somewhat dilated with stool within the lumen. Scattered colonic diverticuli are present without acute diverticulitis.  The bladder and pelvic viscera are within normal limits.  Small amount of free fluid is present within the pelvis.  No free intraperitoneal air.  No pathologically enlarged intra-abdominal pelvic lymph nodes.  Severe scoliosis with extensive multilevel degenerative disc disease is unchanged.  IMPRESSION: 1. Distension of the stomach and proximal small bowel with decompression of the ileum distally.  Findings are concerning for small bowel obstruction.  These findings are similar as compared to prior CT from 12/31/2012.  No definite focal transition point identified.  2.  Small volume free fluid within the pelvis.  Original Report Authenticated By: Rise Mu, M.D.    MDM   1. SBO (small bowel obstruction)      56yf with abdominal pain and n/v. CT consistent with SBO. With recent history, surgery consulted. NGT. Will admit.     Raeford Razor, MD 02/26/13 [phone]

## 2013-02-26 NOTE — Progress Notes (Signed)
SBO seems to be partial and resolving rapidly.  If she continues to be pain and nausea free tomorrow, will try a liquid diet.

## 2013-02-26 NOTE — H&P (Signed)
[name] is an 57 y.o. female.   Chief Complaint: Abdominal pain HPI: Patient is a 57 year old female who underwent an emergency Hartmann colectomy in early June for perforated diverticulitis. She had wound complications was hospitalized for several weeks. She was then readmitted in early July for small bowel obstruction and underwent laparoscopic lysis of adhesions. She had doing well until 3 days ago when she developed crampy abdominal pain intermittent left abdomen associated with nausea and vomiting. This improved for a day and then recurred again today and she presents to the emergency room. The pain is moderately severe. She has been nauseated with vomiting. She states her colostomy output has been relatively normal in amount including today. No fever or chills. No urinary complaints.  Past Medical History  Diagnosis Date  . Legally blind     since pt was a teenager  . Hearing aid worn     pt wears bilateral hearing aids  . Marfan's syndrome affecting skin     with scolosis  . Osteoporosis   . Hypertension   . Substance abuse     recovering alcoholic x12 years   . Hernia 4/08  . 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 Chronic gout 2008  . Stroke 2/08  . Fibroid   . Status post bunionectomy 1/10    bilateral   . Diverticulitis of large intestine with perforation   . Small bowel obstruction due to adhesions     Past Surgical History  Procedure Laterality Date  . Bunionectomy Bilateral 1/10  . Total knee arthroplasty Bilateral 6/08  . Hernia repair    . Laparotomy N/A 11/27/2012    Procedure: EXPLORATORY LAPAROTOMY SIGMOID COLECTOMY, COLOSTOMY;  Surgeon: Lodema Pilot, DO;  Location: [facility];  Service: General;  Laterality: N/A;  . Colostomy    . Laparoscopic abdominal exploration N/A 01/06/2013    Procedure: LAPAROSCOPIC ABDOMINAL EXPLORATION;  Surgeon: Ardeth Sportsman, MD;  Location: [facility];  Service: General;  Laterality: N/A;  . Laparoscopic lysis of  adhesions N/A 01/06/2013    Procedure: LAPAROSCOPIC LYSIS OF ADHESIONS/ INTEROTOMY REPAIR;  Surgeon: Ardeth Sportsman, MD;  Location: [facility];  Service: General;  Laterality: N/A;  . Colon surgery      colectomy    No family history on file. Social History:  reports that she quit smoking about 2 months ago. Her smoking use included Cigarettes. She smoked 1.00 pack per day. She has never used smokeless tobacco. She reports that she does not drink alcohol or use illicit drugs.  Allergies: No Known Allergies  No current facility-administered medications for this encounter.   Current Outpatient Prescriptions  Medication Sig Dispense Refill  . ACTONEL 150 MG tablet TAKE 1 TABLET BY MOUTH EVERY 30 DAYS    WITH WATER ON EMPTY STOMACH, NOTHING BY MOUTH OR LIE DOWN FOR NEXT 30 MINUTES  1 tablet  11  . albuterol (PROVENTIL HFA;VENTOLIN HFA) 108 (90 BASE) MCG/ACT inhaler Inhale 2 puffs into the lungs every 4 (four) hours as needed for wheezing or shortness of breath.  1 Inhaler  5  . allopurinol (ZYLOPRIM) 100 MG tablet TAKE ONE TABLET BY MOUTH ONCE DAILY  30 tablet  11  . amLODipine (NORVASC) 10 MG tablet take 1 tablet by mouth once daily  30 tablet  5  . Calcium Carbonate-Vitamin D (CALCIUM + D PO) Take 1,200 mg by mouth daily.      . cetirizine (ZYRTEC) 10 MG tablet TAKE ONE (1)  TABLET EACH DAY  30 tablet  11  . Cyanocobalamin (VITAMIN B 12 PO) Take 1 tablet by mouth daily.      . cyclobenzaprine (FLEXERIL) 10 MG tablet TAKE ONE (1) TABLET THREE (3) TIMES     A DAY IF NEEDED  270 tablet  11  . docusate sodium (COLACE) 100 MG capsule Take 100 mg by mouth 2 (two) times daily.        Marland Kitchen esomeprazole (NEXIUM) 40 MG capsule Take 1 capsule (40 mg total) by mouth 2 (two) times daily.  60 capsule  11  . feeding supplement (RESOURCE BREEZE) LIQD Take 1 Container by mouth 2 (two) times daily between meals.  28 Container  prn  . HYDROcodone-acetaminophen (NORCO) 10-325 MG per tablet Take 1 tablet by mouth every 6  (six) hours as needed for pain.  120 tablet  5  . megestrol (MEGACE ES) 625 MG/5ML suspension Take 5 mL (625 mg total) by mouth 2 (two) times daily before a meal.  150 mL  5  . Multiple Vitamins-Minerals (MULTIVITAMIN PO) Take by mouth daily.      . predniSONE (DELTASONE) 5 MG tablet Take 1 tablet (5 mg total) by mouth daily.  30 tablet  0  . traMADol (ULTRAM) 50 MG tablet take 1 to 2 tablets by mouth every 6 hours if needed for pain  120 tablet  5     Results for orders placed during the hospital encounter of 02/25/13 (from the past 48 hour(s))  CBC WITH DIFFERENTIAL     Status: Abnormal   Collection Time    02/25/13  9:21 PM      Result Value Range   WBC 12.0 (*) 4.0 - 10.5 K/uL   RBC 4.14  3.87 - 5.11 MIL/uL   Hemoglobin 11.2 (*) 12.0 - 15.0 g/dL   HCT 08.6 (*) 57.8 - 46.9 %   MCV 86.2  78.0 - 100.0 fL   MCH 27.1  26.0 - 34.0 pg   MCHC 31.4  30.0 - 36.0 g/dL   RDW 62.9 (*) 52.8 - 41.3 %   Platelets 375  150 - 400 K/uL   Neutrophils Relative % 79 (*) 43 - 77 %   Neutro Abs 9.5 (*) 1.7 - 7.7 K/uL   Lymphocytes Relative 16  12 - 46 %   Lymphs Abs 1.9  0.7 - 4.0 K/uL   Monocytes Relative 5  3 - 12 %   Monocytes Absolute 0.6  0.1 - 1.0 K/uL   Eosinophils Relative 1  0 - 5 %   Eosinophils Absolute 0.1  0.0 - 0.7 K/uL   Basophils Relative 0  0 - 1 %   Basophils Absolute 0.0  0.0 - 0.1 K/uL  COMPREHENSIVE METABOLIC PANEL     Status: Abnormal   Collection Time    02/25/13  9:21 PM      Result Value Range   Sodium 140  135 - 145 mEq/L   Potassium 3.5  3.5 - 5.1 mEq/L   Chloride 100  96 - 112 mEq/L   CO2 28  19 - 32 mEq/L   Glucose, Bld 109 (*) 70 - 99 mg/dL   BUN 18  6 - 23 mg/dL   Creatinine, Ser 2.44  0.50 - 1.10 mg/dL   Calcium 9.9  8.4 - 01.0 mg/dL   Total Protein 8.2  6.0 - 8.3 g/dL   Albumin 3.9  3.5 - 5.2 g/dL   AST 19  0 - 37 U/L  ALT 16  0 - 35 U/L   Alkaline Phosphatase 79  39 - 117 U/L   Total Bilirubin 0.5  0.3 - 1.2 mg/dL   GFR calc non Af Amer >90  >90 mL/min    GFR calc Af Amer >90  >90 mL/min   Comment: (NOTE)     The eGFR has been calculated using the CKD EPI equation.     This calculation has not been validated in all clinical situations.     eGFR's persistently <90 mL/min signify possible Chronic Kidney     Disease.  LIPASE, BLOOD     Status: Abnormal   Collection Time    02/25/13  9:21 PM      Result Value Range   Lipase 71 (*) 11 - 59 U/L  URINALYSIS, ROUTINE W REFLEX MICROSCOPIC     Status: Abnormal   Collection Time    02/25/13  9:51 PM      Result Value Range   Color, Urine YELLOW  YELLOW   APPearance CLOUDY (*) CLEAR   Specific Gravity, Urine 1.023  1.005 - 1.030   pH 8.0  5.0 - 8.0   Glucose, UA NEGATIVE  NEGATIVE mg/dL   Hgb urine dipstick NEGATIVE  NEGATIVE   Bilirubin Urine NEGATIVE  NEGATIVE   Ketones, ur NEGATIVE  NEGATIVE mg/dL   Protein, ur NEGATIVE  NEGATIVE mg/dL   Urobilinogen, UA 1.0  0.0 - 1.0 mg/dL   Nitrite NEGATIVE  NEGATIVE   Leukocytes, UA SMALL (*) NEGATIVE  URINE MICROSCOPIC-ADD ON     Status: Abnormal   Collection Time    02/25/13  9:51 PM      Result Value Range   Squamous Epithelial / LPF RARE  RARE   WBC, UA 3-6  <3 WBC/hpf   Bacteria, UA FEW (*) RARE   Urine-Other AMORPHOUS URATES/PHOSPHATES     Ct Abdomen Pelvis W Contrast  02/26/2013   *RADIOLOGY REPORT*  Clinical Data: Abdominal pain  CT ABDOMEN AND PELVIS WITH CONTRAST  Technique:  Multidetector CT imaging of the abdomen and pelvis was performed following the standard protocol during bolus administration of intravenous contrast.  Contrast: 80mL OMNIPAQUE IOHEXOL 300 MG/ML  SOLN  Comparison: Prior CT from 12/31/2012.  Findings: The visualized lung bases are clear.  1.8 cm cyst present within the hepatic lobe is unchanged (series 2, image 20). Additional tiny 3 mm hypodensity within the lower segment of the left hepatic lobe is also unchanged, too small to characterize.  The gallbladder is contracted. Spleen and adrenal glands are grossly  unremarkable.  There is increased prominence of the main pancreatic duct as compared to the prior examination, now measuring up to 3 mm (series 2, image 27).  The common bile duct is within normal limits measuring approximately 7 mm. The gallbladder is grossly normal without evidence of acute cholecystitis.  Mild intrahepatic biliary dilatation is noted.  The kidneys demonstrate a normal contrast enhanced appearance without evidence of nephrolithiasis or hydronephrosis.  No focal renal masses.  No stones seen along the course of the renal collecting systems.  Diverting colostomy is again noted within the left lower quadrant. Again, proximal small bowel are distended with a large amount of enteric contrast material present within the lumen.  Scattered air fluid levels are present within several dilated loops of small bowel in the mid and left hemiabdomen.  These measure up to 4.1 cm in diameter.  The small bowel is decompressed distally at the level of the terminal ileum.  Focal transition point is not definitely visualized.  The right colon is somewhat dilated with stool within the lumen. Scattered colonic diverticuli are present without acute diverticulitis.  The bladder and pelvic viscera are within normal limits.  Small amount of free fluid is present within the pelvis.  No free intraperitoneal air.  No pathologically enlarged intra-abdominal pelvic lymph nodes.  Severe scoliosis with extensive multilevel degenerative disc disease is unchanged.  IMPRESSION: 1. Distension of the stomach and proximal small bowel with decompression of the ileum distally.  Findings are concerning for small bowel obstruction.  These findings are similar as compared to prior CT from 12/31/2012.  No definite focal transition point identified.  2.  Small volume free fluid within the pelvis.   Original Report Authenticated By: Rise Mu, M.D.    Review of Systems  Constitutional: Negative for fever and chills.  Eyes:        Blind  Respiratory: Negative.   Cardiovascular: Positive for leg swelling. Negative for chest pain, palpitations, orthopnea and claudication.  Gastrointestinal: Positive for nausea, vomiting and abdominal pain. Negative for diarrhea, constipation and blood in stool.  Genitourinary: Negative.   Musculoskeletal: Positive for joint pain.    Blood pressure 149/97, pulse 70, temperature 98 F (36.7 C), temperature source Oral, resp. rate 18, height 5\' 8"  (1.727 m), weight 131 lb (59.421 kg), last menstrual period 06/25/2006, SpO2 94.00%. Physical Exam  General: Alert, thin African American female, in no distress Skin: Warm and dry without rash or infection. HEENT: No palpable masses or thyromegaly. Opaque conjunctiva on the right and what appears to be some chronic inflammation. Oropharynx clear. Lymph nodes: No cervical, supraclavicular, or inguinal nodes palpable. Lungs: Breath sounds clear and equal without increased work of breathing. Pectus deformity consistent with Marfan's Cardiovascular: Regular rate and rhythm without murmur. No JVD. 1+ bilateral lower from the edema. Abdomen: Moderately distended particularly in the lower abdomen. Positive bowel sounds. Midline wound completely healed except for a tiny area of clean granulation tissue. The colostomy in the left lower quadrant. Currently no gas or bowel movements in her colostomy bag. No significant tenderness. No masses palpable. No organomegaly. No palpable hernias. Extremities: Marked arthritic deformities of her hands bilaterally. 1+ lower extremity edema. Neurologic: Alert and fully oriented.  Assessment/Plan Apparent recurrent partial small bowel obstruction history of emergency Hartmann colectomy in early June and laparoscopic lysis of adhesions for small bowel obstruction approximately 6 weeks ago. Patient appears stable currently and her abdomen although somewhat distended is benign. CT scan shows some moderate gas and stool in the  right colon as well. The patient will be admitted and treated with bowel rest and IV fluids and observe closely.  Amarissa Koerner T 02/26/2013, 1:35 AM

## 2013-02-26 NOTE — ED Notes (Signed)
Dr Johna Sheriff at bedside ngt placed no gastric content returned in  Tube, stat KUB done +

## 2013-02-27 LAB — URINE CULTURE: Colony Count: >=100000

## 2013-02-27 NOTE — Progress Notes (Signed)
Patient seen and examined.  Agree with PA's note.  

## 2013-02-27 NOTE — Progress Notes (Signed)
Patient ID: [name], female   DOB: 1956/06/07, 57 y.o.   MRN: [id number]    Subjective: Pt continues to have no symptoms, hungry and wants to eat and drink, denies pain/n/v  Objective: Vital signs in last 24 hours: Temp:  [97.9 F (36.6 C)-99.3 F (37.4 C)] 99.3 F (37.4 C) (09/05 0535) Pulse Rate:  [72-90] 76 (09/05 0535) Resp:  [18-19] 18 (09/05 0535) BP: (158-165)/(79-90) 158/80 mmHg (09/05 0535) SpO2:  [98 %-99 %] 99 % (09/05 0535) Last BM Date: 02/26/13  Intake/Output from previous day: 09/04 0701 - 09/05 0700 In: 2413.3 [I.V.:2413.3] Out: 2625 [Urine:2625] Intake/Output this shift: Total I/O In: -  Out: 350 [Urine:350]  PE: Abd: non tender, soft, +BS, ostomy with liquid stool, pink and healthy General: NAD  Lab Results:   Recent Labs  02/25/13 2121  WBC 12.0*  HGB 11.2*  HCT 35.7*  PLT 375   BMET  Recent Labs  02/25/13 2121  NA 140  K 3.5  CL 100  CO2 28  GLUCOSE 109*  BUN 18  CREATININE 0.79  CALCIUM 9.9   PT/INR No results found for this basename: LABPROT, INR,  in the last 72 hours CMP     Component Value Date/Time   NA 140 02/25/2013 2121   K 3.5 02/25/2013 2121   CL 100 02/25/2013 2121   CO2 28 02/25/2013 2121   GLUCOSE 109* 02/25/2013 2121   BUN 18 02/25/2013 2121   CREATININE 0.79 02/25/2013 2121   CALCIUM 9.9 02/25/2013 2121   PROT 8.2 02/25/2013 2121   ALBUMIN 3.9 02/25/2013 2121   AST 19 02/25/2013 2121   ALT 16 02/25/2013 2121   ALKPHOS 79 02/25/2013 2121   BILITOT 0.5 02/25/2013 2121   GFRNONAA >90 02/25/2013 2121   GFRAA >90 02/25/2013 2121   Lipase     Component Value Date/Time   LIPASE 71* 02/25/2013 2121       Studies/Results: Ct Abdomen Pelvis W Contrast  02/26/2013   *RADIOLOGY REPORT*  Clinical Data: Abdominal pain  CT ABDOMEN AND PELVIS WITH CONTRAST  Technique:  Multidetector CT imaging of the abdomen and pelvis was performed following the standard protocol during bolus administration of intravenous contrast.  Contrast: 80mL  OMNIPAQUE IOHEXOL 300 MG/ML  SOLN  Comparison: Prior CT from 12/31/2012.  Findings: The visualized lung bases are clear.  1.8 cm cyst present within the hepatic lobe is unchanged (series 2, image 20). Additional tiny 3 mm hypodensity within the lower segment of the left hepatic lobe is also unchanged, too small to characterize.  The gallbladder is contracted. Spleen and adrenal glands are grossly unremarkable.  There is increased prominence of the main pancreatic duct as compared to the prior examination, now measuring up to 3 mm (series 2, image 27).  The common bile duct is within normal limits measuring approximately 7 mm. The gallbladder is grossly normal without evidence of acute cholecystitis.  Mild intrahepatic biliary dilatation is noted.  The kidneys demonstrate a normal contrast enhanced appearance without evidence of nephrolithiasis or hydronephrosis.  No focal renal masses.  No stones seen along the course of the renal collecting systems.  Diverting colostomy is again noted within the left lower quadrant. Again, proximal small bowel are distended with a large amount of enteric contrast material present within the lumen.  Scattered air fluid levels are present within several dilated loops of small bowel in the mid and left hemiabdomen.  These measure up to 4.1 cm in diameter.  The small bowel  is decompressed distally at the level of the terminal ileum.  Focal transition point is not definitely visualized.  The right colon is somewhat dilated with stool within the lumen. Scattered colonic diverticuli are present without acute diverticulitis.  The bladder and pelvic viscera are within normal limits.  Small amount of free fluid is present within the pelvis.  No free intraperitoneal air.  No pathologically enlarged intra-abdominal pelvic lymph nodes.  Severe scoliosis with extensive multilevel degenerative disc disease is unchanged.  IMPRESSION: 1. Distension of the stomach and proximal small bowel with  decompression of the ileum distally.  Findings are concerning for small bowel obstruction.  These findings are similar as compared to prior CT from 12/31/2012.  No definite focal transition point identified.  2.  Small volume free fluid within the pelvis.   Original Report Authenticated By: Rise Mu, M.D.   Dg Abd Portable 1v  02/26/2013   *RADIOLOGY REPORT*  Clinical Data: Evaluate NG tube placement  PORTABLE ABDOMEN - 1 VIEW  Comparison: CT performed on 02/25/2013  Findings: No enteric tube is visualized on the single image provided.  Multiple mildly prominent gas filled loops of bowel are seen scattered throughout the abdomen, better evaluated on prior CT.  No new abnormality identified.  IMPRESSION: Nonvisualization of enteric tube.  Query malpositioning within the hypopharynx or thorax.   Original Report Authenticated By: Rise Mu, M.D.    Anti-infectives: Anti-infectives   None       Assessment/Plan SBO: seems to be resolved now, no more n/v at this time, will start clears this am and then advance to fulls later today, likely home tomorrow once tolerating regular diet.    LOS: 2 days    WHITE, ELIZABETH 02/27/2013

## 2013-02-27 NOTE — Care Management Note (Signed)
    Page 1 of 2   02/27/2013     12:33:15 PM   CARE MANAGEMENT NOTE 02/27/2013  Patient:  [name], [name]   Account Number:  [id number]  Date Initiated:  02/26/2013  Documentation initiated by:  Lorenda Ishihara  Subjective/Objective Assessment:   57 yo female admitted with SBO. PTA lived at home alone.     Action/Plan:   Home when stable   Anticipated DC Date:  02/28/2013   Anticipated DC Plan:  HOME W HOME HEALTH SERVICES      DC Planning Services  CM consult      [facility] Choice  HOME HEALTH   Choice offered to / List presented to:  C-1 Patient        HH arranged  HH-1 RN  HH-2 PT  HH-3 OT      [facility] agency  [facility]   Status of service:  Completed, signed off Medicare Important Message given?   (If response is "NO", the following Medicare IM given date fields will be blank) Date Medicare IM given:   Date Additional Medicare IM given:    Discharge Disposition:  HOME W HOME HEALTH SERVICES  Per UR Regulation:  Reviewed for med. necessity/level of care/duration of stay  If discussed at Long Length of Stay Meetings, dates discussed:    Comments:  02-26-13 Lorenda Ishihara RN CM 1200 Spoke with patient at bedside, states she was active with [facility] for [facility] services and would like to resume these at d/c. Patient does live alone but daughter checks on her frequently and provides assistance. She also has an aide that assists her 7 days per week for 2h per day. Spoke with representative at [employer] [facility] and they will resume services, orders will need to be faxed to them at d/c.

## 2013-02-28 MED ORDER — AMLODIPINE BESYLATE 10 MG PO TABS
10.0000 mg | ORAL_TABLET | Freq: Once | ORAL | Status: AC
  Administered 2013-02-28: 10 mg via ORAL
  Filled 2013-02-28: qty 1

## 2013-02-28 NOTE — Progress Notes (Signed)
  Subjective: Tolerating liquids.  Objective: Vital signs in last 24 hours: Temp:  [98.3 F (36.8 C)-99 F (37.2 C)] 98.7 F (37.1 C) (09/06 0444) Pulse Rate:  [64-83] 64 (09/06 0444) Resp:  [16-18] 18 (09/06 0444) BP: (155-170)/(77-92) 170/77 mmHg (09/06 0444) SpO2:  [96 %-100 %] 100 % (09/06 0444) Last BM Date: 02/27/13  Intake/Output from previous day: 09/05 0701 - 09/06 0700 In: 1755 [P.O.:920; I.V.:835] Out: 2650 [Urine:2625; Stool:25] Intake/Output this shift:    PE: General- In NAD Abdomen-soft, nontender, gas in colostomy bag  Lab Results:   Recent Labs  02/25/13 2121  WBC 12.0*  HGB 11.2*  HCT 35.7*  PLT 375   BMET  Recent Labs  02/25/13 2121  NA 140  K 3.5  CL 100  CO2 28  GLUCOSE 109*  BUN 18  CREATININE 0.79  CALCIUM 9.9   PT/INR No results found for this basename: LABPROT, INR,  in the last 72 hours Comprehensive Metabolic Panel:    Component Value Date/Time   NA 140 02/25/2013 2121   K 3.5 02/25/2013 2121   CL 100 02/25/2013 2121   CO2 28 02/25/2013 2121   BUN 18 02/25/2013 2121   CREATININE 0.79 02/25/2013 2121   GLUCOSE 109* 02/25/2013 2121   CALCIUM 9.9 02/25/2013 2121   AST 19 02/25/2013 2121   ALT 16 02/25/2013 2121   ALKPHOS 79 02/25/2013 2121   BILITOT 0.5 02/25/2013 2121   PROT 8.2 02/25/2013 2121   ALBUMIN 3.9 02/25/2013 2121     Studies/Results: No results found.  Anti-infectives: Anti-infectives   None      Assessment Active Problems: Partial SBO-continues to improve    LOS: 3 days   Plan: Advance diet.  Home later today if diet tolerated.   Naleah Kofoed J 02/28/2013

## 2013-02-28 NOTE — Progress Notes (Signed)
Assessment unchanged.  All questions pertaining to D/C we answered.  Pt was D/C'd via wheelchair and accompanied by NT.  Chalon Zobrist L  

## 2013-03-03 ENCOUNTER — Telehealth: Payer: Self-pay | Admitting: Family Medicine

## 2013-03-03 NOTE — Telephone Encounter (Signed)
Patient was D/C'd from hospital Saturday. Home care is requesting orders to resume home health nursing care 2x/week for 6 weeks. You can fax these orders to this #: [phone]. Thank you.

## 2013-03-04 NOTE — Telephone Encounter (Signed)
Please set this up

## 2013-03-04 NOTE — Telephone Encounter (Signed)
I faxed over the orders to below number.

## 2013-03-05 ENCOUNTER — Telehealth: Payer: Self-pay | Admitting: Family Medicine

## 2013-03-05 NOTE — Telephone Encounter (Signed)
[facility] resumed care of pt today. Your orders states 2 x wk/ for 6 weeks,  BUT wound center has an order for 3 X week (dressing changes) for 6 wks. Will you change your order to reflect the dressing change order? Verbal order and backup fax order :    Fax  [phone]

## 2013-03-06 NOTE — Telephone Encounter (Signed)
I don't understand. If they already have orders for dressing changes from the Wound Center, why do they need a duplicate order from me?

## 2013-03-06 NOTE — Discharge Summary (Signed)
Physician Discharge Summary  Patient ID: [name] MRN: [id number] DOB/AGE: Nov 11, 1955 57 y.o.  Admit date: 02/25/2013 Discharge date: 03/06/2013  Admitting Diagnosis: Abdominal pain SBO  Discharge Diagnosis Patient Active Problem List   Diagnosis Date Noted  . SBO (small bowel obstruction) 01/05/2013  . 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 Shock 11/27/2012  . Acute blood loss anemia 11/27/2012  . Hypokalemia 11/27/2012  . HEMATOMA 11/15/2009  . LEG EDEMA 09/26/2009  . RHEUMATOID ARTHRITIS 06/07/2009  . OTITIS EXTERNA 01/10/2009  . CELLULITIS AND ABSCESS OF LEG EXCEPT FOOT 07/22/2008  . ALLERGIC REACTION 03/22/2008  . THYROID NODULE 07/01/2007  . LUNG NODULE 07/01/2007  . OSTEOPOROSIS 05/07/2007  . 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 BLINDNESS, BILATERAL 04/12/2007  . HEMORRHOIDS, INTERNAL 04/12/2007  . GOUTY ARTHROPATHY 01/14/2007  . HYPERLIPIDEMIA 01/07/2007  . HYPERTENSION 01/07/2007  . GERD 01/07/2007  . OSTEOARTHRITIS 01/07/2007  . 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 She had wound complications was hospitalized for several weeks. She was then readmitted in early July for small bowel obstruction and underwent laparoscopic lysis of adhesions. She had doing well until 3 days ago before this admission when she developed crampy abdominal pain intermittent left abdomen associated with nausea and vomiting. This improved for a day and then recurred again and she presents to the emergency room. The pain was moderately severe. She had been nauseated with vomiting. She states her colostomy output has been relatively normal in amount including today. No fever or chills. No urinary complaints.  She was admitted and placed on bowel rest.  Over the next two days her  symptoms resolved and her diet was advanced.  Her ostomy continued to work well.  She was then felt stable for discharge home.      Medication List         ACTONEL 150 MG tablet  Generic drug:  risedronate  TAKE 1 TABLET BY MOUTH EVERY 30 DAYS    WITH WATER ON EMPTY STOMACH, NOTHING BY MOUTH OR LIE DOWN FOR NEXT 30 MINUTES     albuterol 108 (90 BASE) MCG/ACT inhaler  Commonly known as:  PROVENTIL HFA;VENTOLIN HFA  Inhale 2 puffs into the lungs every 4 (four) hours as needed for wheezing or shortness of breath.     allopurinol 100 MG tablet  Commonly known as:  ZYLOPRIM  TAKE ONE TABLET BY MOUTH ONCE DAILY     amLODipine 10 MG tablet  Commonly known as:  NORVASC  take 1 tablet by mouth once daily     CALCIUM + D PO  Take 1,200 mg by mouth daily.     cetirizine 10 MG tablet  Commonly known as:  ZYRTEC  TAKE ONE (1) TABLET EACH DAY     cyclobenzaprine 10 MG tablet  Commonly known as:  FLEXERIL  TAKE ONE (1) TABLET THREE (3) TIMES     A DAY IF NEEDED     docusate sodium 100 MG capsule  Commonly known as:  COLACE  Take 100 mg by mouth 2 (two) times daily.     esomeprazole 40 MG capsule  Commonly known as:  NEXIUM  Take 1 capsule (40 mg total) by mouth 2 (two) times daily.     feeding supplement Liqd  Take 1 Container by mouth 2 (two) times daily between meals.  HYDROcodone-acetaminophen 10-325 MG per tablet  Commonly known as:  NORCO  Take 1 tablet by mouth every 6 (six) hours as needed for pain.     megestrol 625 MG/5ML suspension  Commonly known as:  MEGACE ES  Take 5 mL (625 mg total) by mouth 2 (two) times daily before a meal.     MULTIVITAMIN PO  Take by mouth daily.     predniSONE 5 MG tablet  Commonly known as:  DELTASONE  Take 1 tablet (5 mg total) by mouth daily.     traMADol 50 MG tablet  Commonly known as:  ULTRAM  take 1 to 2 tablets by mouth every 6 hours if needed for pain     VITAMIN B 12 PO  Take 1 tablet by mouth daily.            Signed: Clance Boll, [facility] [phone]  03/06/2013, 7:00 AM

## 2013-03-06 NOTE — Telephone Encounter (Signed)
Per Dr. Clent Ridges, they need to call the doctor that wrote the order originally for the wound care Dr. Ardath Sax. I spoke with Britta Mccreedy and gave this message.

## 2013-03-06 NOTE — Discharge Summary (Signed)
Agree with summary. 

## 2013-03-09 ENCOUNTER — Encounter (HOSPITAL_BASED_OUTPATIENT_CLINIC_OR_DEPARTMENT_OTHER): Payer: Medicare Other | Attending: Plastic Surgery

## 2013-03-09 DIAGNOSIS — L98499 Non-pressure chronic ulcer of skin of other sites with unspecified severity: Secondary | ICD-10-CM | POA: Insufficient documentation

## 2013-03-10 DIAGNOSIS — Q8740 Marfan's syndrome, unspecified: Secondary | ICD-10-CM

## 2013-03-10 DIAGNOSIS — IMO0002 Reserved for concepts with insufficient information to code with codable children: Secondary | ICD-10-CM

## 2013-03-10 DIAGNOSIS — S2090XA Unspecified superficial injury of unspecified parts of thorax, initial encounter: Secondary | ICD-10-CM

## 2013-03-10 DIAGNOSIS — H548 Legal blindness, as defined in USA: Secondary | ICD-10-CM

## 2013-03-10 DIAGNOSIS — Z433 Encounter for attention to colostomy: Secondary | ICD-10-CM

## 2013-03-10 DIAGNOSIS — I10 Essential (primary) hypertension: Secondary | ICD-10-CM

## 2013-03-10 NOTE — Progress Notes (Signed)
[facility]  NAME:  NINNIE, FEIN NO.:  [id number]  MEDICAL RECORD NO.:  [id number]      DATE OF BIRTH:  10/26/1955  PHYSICIAN:  Wayland Denis, DO       VISIT DATE:  03/09/2013                                  OFFICE VISIT   The patient is a 57 year old female who is here for followup on her abdominal ulcer.  She has done extremely well and has actually completely healed the area.  There has been no change in her medications or social history.  She has been doing much better with her protein intake and her daughter is here by her side.  On exam, she is alert, oriented, cooperative, not in any acute distress. Her breathing is unlabored.  Her heart rate is regular.  Her abdomen is soft.  The wound is completely healed and epithelialized and looks wonderful.  Recommend 1 week of triple antibiotic to help with just a little scab that is on it and then after that just a lotion and followup as needed.     Wayland Denis, DO     CS/MEDQ  D:  03/09/2013  T:  03/10/2013  Job:  [id number]

## 2013-03-12 ENCOUNTER — Telehealth: Payer: Self-pay | Admitting: Family Medicine

## 2013-03-12 NOTE — Telephone Encounter (Signed)
Requesting orders for PT & OT evaluation. Please call w/ orders.

## 2013-03-13 NOTE — Telephone Encounter (Signed)
Please order PT and OT

## 2013-03-13 NOTE — Telephone Encounter (Signed)
We have not ordered these since pt has been home? We did order home health nursing only.

## 2013-03-13 NOTE — Telephone Encounter (Signed)
This is okay but I thought we had already done this

## 2013-03-13 NOTE — Telephone Encounter (Signed)
I left voice message for [name] with below verbal orders.

## 2013-03-18 ENCOUNTER — Telehealth: Payer: Self-pay | Admitting: Family Medicine

## 2013-03-18 NOTE — Telephone Encounter (Signed)
Pt needs wheelchair. Audrey/ [facility] called.  Pt was there for rehab a mo ago. they would like your help in getting pt a wheelchair. Need order and progress note stating why pt needs. Pt has marfans syndrone., arthritis, legally blind, osteoper and scoliosis

## 2013-03-19 ENCOUNTER — Telehealth: Payer: Self-pay | Admitting: Family Medicine

## 2013-03-19 NOTE — Telephone Encounter (Signed)
Please give this verbal order.

## 2013-03-19 NOTE — Telephone Encounter (Signed)
Spoke to Liborio Negrin Torres.  He said the patient was evaluated a few weeks ago from the [place] office.  He is not sure who ordered to eval.  He would like to have the order for physical therapy because of her Marfan's syndrome to work on her strengthening and transfer strengthening.

## 2013-03-19 NOTE — Telephone Encounter (Signed)
Please order the PT as requested

## 2013-03-19 NOTE — Telephone Encounter (Signed)
[name] notified to proceed with therapy.  Their office will fax a written order.  Verbal order okay for now.

## 2013-03-19 NOTE — Telephone Encounter (Signed)
Please find out what "hospital" she was recently in and why

## 2013-03-19 NOTE — Telephone Encounter (Signed)
See below note.

## 2013-03-19 NOTE — Telephone Encounter (Signed)
[name] needs verbal order  to continue occupational therapy to assist pt in obtain bathroom equipment. Pt needs therapy once a wk until [name] get the equipment.

## 2013-03-19 NOTE — Telephone Encounter (Signed)
Need verbal order for phy therapy: 2 days /wk for 3 wks. (Pt  dc'd from hospital Monday)

## 2013-03-19 NOTE — Telephone Encounter (Signed)
Please find out what "hospital" she was in and why

## 2013-03-20 NOTE — Telephone Encounter (Signed)
[name] following up on verbal. pls call. [phone]  Ok to leave message!

## 2013-03-20 NOTE — Telephone Encounter (Signed)
Verbal orders were left for [name].

## 2013-03-20 NOTE — Telephone Encounter (Signed)
I left voice message for Joleen with below verbal orders.

## 2013-03-23 ENCOUNTER — Ambulatory Visit (INDEPENDENT_AMBULATORY_CARE_PROVIDER_SITE_OTHER): Payer: Medicare Other | Admitting: Family Medicine

## 2013-03-23 ENCOUNTER — Encounter: Payer: Self-pay | Admitting: Family Medicine

## 2013-03-23 VITALS — BP 144/86 | Temp 98.7°F

## 2013-03-23 DIAGNOSIS — G4700 Insomnia, unspecified: Secondary | ICD-10-CM

## 2013-03-23 DIAGNOSIS — M1990 Unspecified osteoarthritis, unspecified site: Secondary | ICD-10-CM

## 2013-03-23 DIAGNOSIS — Q8740 Marfan's syndrome, unspecified: Secondary | ICD-10-CM

## 2013-03-23 DIAGNOSIS — K5732 Diverticulitis of large intestine without perforation or abscess without bleeding: Secondary | ICD-10-CM

## 2013-03-23 DIAGNOSIS — K56609 Unspecified intestinal obstruction, unspecified as to partial versus complete obstruction: Secondary | ICD-10-CM

## 2013-03-23 DIAGNOSIS — Z23 Encounter for immunization: Secondary | ICD-10-CM

## 2013-03-23 DIAGNOSIS — K5720 Diverticulitis of large intestine with perforation and abscess without bleeding: Secondary | ICD-10-CM

## 2013-03-23 MED ORDER — TEMAZEPAM 30 MG PO CAPS: 30.0000 mg | ORAL_CAPSULE | Freq: Every evening | ORAL | Status: DC | PRN

## 2013-03-23 NOTE — Progress Notes (Signed)
  Subjective:    Patient ID: [name], female    DOB: 04-15-1956, 57 y.o.   MRN: [id number]  HPI Here to follow up a hospital stay from 02-25-13 to 03-06-13 for another SBO. Fortunately this resolved with simple bowel rest. She is now back to her baseline. She is eating well and her bowels are moving. Her only complaint today is trouble sleeping. She usually wakes up about one hour after falling asleep and then she struggles to go back to sleep.    Review of Systems  Constitutional: Negative.   Respiratory: Negative.   Cardiovascular: Negative.   Gastrointestinal: Negative.        Objective:   Physical Exam  Constitutional: No distress.  [name]  Cardiovascular: Normal rate, regular rhythm, normal heart sounds and intact distal pulses.   Pulmonary/Chest: Effort normal and breath sounds normal.  Abdominal: Soft. Bowel sounds are normal. She exhibits no distension and no mass. There is no tenderness. There is no rebound and no guarding.          Assessment & Plan:  Her bowel function is back to baseline. Try Temazepam for sleep.

## 2013-03-27 ENCOUNTER — Telehealth: Payer: Self-pay | Admitting: Family Medicine

## 2013-03-27 NOTE — Telephone Encounter (Signed)
Pls advise.  

## 2013-03-27 NOTE — Telephone Encounter (Signed)
Patient Information:  Caller Name: Maurissa  Phone: [phone]  Patient: [name], [name]  Gender: Female  DOB: July 30, 1955  Age: 57 Years  PCP: Gershon Cr[facility])  Office Follow Up:  Does the office need to follow up with this patient?: Yes  Instructions For The Office: Allergic to Restoril. Needing another med for sleep.   Symptoms  Reason For Call & Symptoms: Seen in the office on 03/23/13 and started on new Sleeping Med: Restoril. . Started with red unraised itchy spots on stomach, around Stoma, on arms. Afebrile.  No other sx. Triage and Care advice per Rash Widespread on Drugs Protocol. Please ask Dr. Clent Ridges if another sleep med can be called in. She hasn't taken the Restoril since 03/25/13. Advised to take Benedryl tonight since she has not had her Zyrtec today. She is going to call back if rash persists > 48 hours after taking antihistamines.  Reviewed Health History In EMR: Yes  Reviewed Medications In EMR: Yes  Reviewed Allergies In EMR: Yes  Reviewed Surgeries / Procedures: Yes  Date of Onset of Symptoms: 03/26/2013  Guideline(s) Used:  Rash or Redness - Widespread  Rash - Widespread on Drugs - Drug Reaction  Disposition Per Guideline:   See Today in Office  Reason For Disposition Reached:   All other patients with widespread rash taking a new medication  Advice Given:  Reassurance:  There are many causes of widespread rashes and most of the time they are not serious. Common causes include viral illness (e.g., cold viruses) and allergic reactions (to a food, medicine, or environmental exposure).  Stopping the Medication:  If rash is hives or is very itchy - stop the medication. Reason: possible allergy.  For Itchy Rashes:  Wash the skin once with soap to remove any irritants. Use Benadryl or take an Aveeno bath to reduce the itching.  Oral Antihistamine Medication for Itching:  Take an antihistamine like diphenhydramine (Benadryl) for widespread rashes that  itch. The adult dosage of Benadryl is 25-50 mg by mouth 4 times daily.  CAUTION: This type of medication may cause sleepiness. Do not drink alcohol, drive, or operate dangerous machinery while taking antihistamines. Do not take these medications if you have prostate problems.  Contagiousness:  Avoid contact with pregnant women until a diagnosis is made. Most viral rashes are contagious (especially if a fever is present). Allergic drug rashes are not contagious.  Call Back If:  You become worse.  Patient Refused Recommendation:  Patient Will Follow Up With Office Later  Will call back if rash worsens or persists on antibistiamine. Possible allergy to Restoril. Can another sleep med be called in.

## 2013-03-27 NOTE — Telephone Encounter (Signed)
I spoke with pt and she will check back in with [place] next week.

## 2013-03-27 NOTE — Telephone Encounter (Signed)
Let's stay away from prescription sleep meds for now until this settles down. Use Benadryl and we can talk about it next week.

## 2013-04-02 ENCOUNTER — Telehealth: Payer: Self-pay | Admitting: Family Medicine

## 2013-04-02 DIAGNOSIS — M069 Rheumatoid arthritis, unspecified: Secondary | ICD-10-CM

## 2013-04-02 NOTE — Telephone Encounter (Signed)
Patient Information:  Caller Name: [name]  Phone: [phone]  Patient: [name], [name]  Gender: Female  DOB: 03/05/1956  Age: 57 Years  PCP: Gershon Crane [facility])  Office Follow Up:  Does the office need to follow up with this patient?: No  Instructions For The Office: N/A  RN Note:  Left Elbow joint pain, radiating from shoulder down, Hx of Osteoporosis.  Home Health, RN evaluated Pt on 10-9, informed Pt she may do well w/ Naproxen.  Pt has some Left elbow swelling w/ severe joint pain, radiating from Left Shoulder down. Pt would like to ask Dr Clent Ridges for [employer].  Last OV was 03-23-13.   Pt uses [employer], [address], info in [place].  Please review w/ MD and f/u w/ Pt.  Symptoms  Reason For Call & Symptoms: Left Elbow joint pain, radiating from shoulder down, Hx of Osteoporosis.  Reviewed Health History In EMR: Yes  Reviewed Medications In EMR: Yes  Reviewed Allergies In EMR: Yes  Reviewed Surgeries / Procedures: Yes  Date of Onset of Symptoms: 02/05/2013  Treatments Tried: Ultram  Treatments Tried Worked: No  Guideline(s) Used:  Elbow Pain  Disposition Per Guideline:   See Today in Office  Reason For Disposition Reached:   Swollen joint  Advice Given:  N/A  Patient Will Follow Care Advice:  YES

## 2013-04-03 ENCOUNTER — Telehealth: Payer: Self-pay | Admitting: Family Medicine

## 2013-04-03 MED ORDER — NAPROXEN 500 MG PO TABS: 500.0000 mg | ORAL_TABLET | Freq: Two times a day (BID) | ORAL | Status: DC | PRN

## 2013-04-03 NOTE — Telephone Encounter (Signed)
Referral was done  

## 2013-04-03 NOTE — Telephone Encounter (Signed)
Pt is progressing well w/ therapy. Would like to request 2 wks/ 2 x week for physical therapy.  FYI He will gladly assist you in getting her approved for wheel chair.

## 2013-04-03 NOTE — Telephone Encounter (Signed)
I spoke with [name] and gave the verbal order for PT.

## 2013-04-03 NOTE — Telephone Encounter (Signed)
I sent script e-scribe and spoke with pt. Also she wants a referral for Rheumatologist.

## 2013-04-03 NOTE — Telephone Encounter (Signed)
Call in Naproxen 500 mg bid prn pain, #60 with 5 rf

## 2013-04-03 NOTE — Telephone Encounter (Signed)
Okay for the therapy

## 2013-04-08 ENCOUNTER — Encounter: Payer: Self-pay | Admitting: Family Medicine

## 2013-04-10 ENCOUNTER — Telehealth: Payer: Self-pay | Admitting: Family Medicine

## 2013-04-10 NOTE — Telephone Encounter (Signed)
Would like an order faxed [phone] to extend their stay with pt to twice per week.  Pt has recently started back on Naproxen and they would like to monitor her reaction to medication.  Thanks a bunch.  Archie Patten

## 2013-04-13 NOTE — Telephone Encounter (Signed)
I spoke with Britta Mccreedy and gave the verbal order below.

## 2013-04-13 NOTE — Telephone Encounter (Signed)
Okay for home health visits for 4 more weeks

## 2013-04-14 ENCOUNTER — Telehealth (INDEPENDENT_AMBULATORY_CARE_PROVIDER_SITE_OTHER): Payer: Self-pay

## 2013-04-14 NOTE — Telephone Encounter (Signed)
Pt called stating she had grey mucous come from rectum over weekend when she voided. No fever. No pain. No blood. Pt advised it is not uncommon for pts that have a rectal stump to produce mucous and as it collects they will be small amount of drainage on occasion from rectum. Pt advised if she has pain,fever,bleeding or increased amt of drainage she needs to be seen by MD. Pt states she understands.

## 2013-04-15 NOTE — Telephone Encounter (Signed)
I spoke with pt  

## 2013-04-15 NOTE — Telephone Encounter (Signed)
Patient Information:  Caller Name: Nena Jordan  Phone: [phone]  Patient: [name], [name]  Gender: Female  DOB: 1956/04/08  Age: 57 Years  PCP: Gershon Cr[facility])  Office Follow Up:  Does the office need to follow up with this patient?:Yes  Instructions For The Office:Referral to Rheumatologist for L elbow pain.  RN Note:  Unable to come into office until 04/20/13 d/t transportation issues. Advised to call back if sx worsen and appointment scheduled for 04/20/13 at 0945.  Symptoms  Reason For Call & Symptoms: Calling about edema in both legs > L side and L elbow is hurting and is swollen (Ongoing issue with Arthritis). Recently hospitalized and was in Nursing Rehab and ankle pain started at that time-  03/08/13. Using walker. She is needing referral to Rheumatologist for elbow. Wears compression stockings but hard to put on lately d/t increased swelling.   Reviewed Health History In EMR: Yes  Reviewed Medications In EMR: Yes  Reviewed Allergies In EMR: Yes  Reviewed Surgeries / Procedures: Yes  Date of Onset of Symptoms: 03/13/2013  Treatments Tried: Compression hose, Naproxen  Treatments Tried Worked: No  Guideline(s) Used:  Elbow Pain  Leg Swelling and Edema  Disposition Per Guideline:   Go to ED Now (or to Office with PCP Approval)  Reason For Disposition Reached:   Thigh, calf, or ankle swelling in both legs, but one side is definitely more swollen  Advice Given:  Expected Course  : If this does not get better during the next week or if it recurs, then you should make an appointment with your doctor.  Call Back If:  Moderate pain (e.g., interferes with normal activities) lasts more than 3 days  Mild pain lasts more than 7 days  Swollen joint or fever occurs  You become worse.  Expected Course:  If your leg swelling does not get better during the next week or if it recurs, make an appointment with your doctor.  Varicose Veins - Treatment:  Try to rest and  elevate your legs above your heart a couple times each day for 15 minutes.  Walking is good for your blood flow (Reason: helps pump the blood out of the veins).  Avoid prolonged standing in one place.  Support hose may be helpful. Put them on first thing in the morning when the swelling is least.  Call Back If:  Swelling becomes worse  Swelling becomes red or painful to the touch  Calf pain occurs and becomes constant  You become worse.  Patient Refused Recommendation:  Patient Refused Care Advice  Appointment scheduled for 04/20/13 at 09:45.

## 2013-04-15 NOTE — Telephone Encounter (Signed)
I put in a referral to [facility] for rheumatology on 04-03-13. I suggest she call them to see where this stands

## 2013-04-17 ENCOUNTER — Telehealth: Payer: Self-pay | Admitting: Family Medicine

## 2013-04-17 NOTE — Telephone Encounter (Signed)
Pt did not want to do her physical therapy. Pt will be dc'd from home health physical therapy today pt request.

## 2013-04-17 NOTE — Telephone Encounter (Signed)
noted 

## 2013-04-20 ENCOUNTER — Ambulatory Visit (INDEPENDENT_AMBULATORY_CARE_PROVIDER_SITE_OTHER): Payer: Medicare Other | Admitting: Family Medicine

## 2013-04-20 ENCOUNTER — Encounter: Payer: Self-pay | Admitting: Family Medicine

## 2013-04-20 VITALS — BP 134/72 | Temp 98.2°F | Wt 144.0 lb

## 2013-04-20 DIAGNOSIS — I10 Essential (primary) hypertension: Secondary | ICD-10-CM

## 2013-04-20 DIAGNOSIS — R609 Edema, unspecified: Secondary | ICD-10-CM

## 2013-04-20 DIAGNOSIS — M069 Rheumatoid arthritis, unspecified: Secondary | ICD-10-CM

## 2013-04-20 DIAGNOSIS — R600 Localized edema: Secondary | ICD-10-CM | POA: Insufficient documentation

## 2013-04-20 MED ORDER — FUROSEMIDE 20 MG PO TABS: 20.0000 mg | ORAL_TABLET | Freq: Every day | ORAL | Status: DC

## 2013-04-20 MED ORDER — HYDROCODONE-ACETAMINOPHEN 10-325 MG PO TABS: 1.0000 | ORAL_TABLET | Freq: Four times a day (QID) | ORAL | Status: DC | PRN

## 2013-04-20 NOTE — Progress Notes (Signed)
  Subjective:    Patient ID: [name], female    DOB: [birth year], 57 y.o.   MRN: [id number]  HPI Here for refills on meds but also to discuss fluid in her legs. This has been building up for the past few weeks. No chest pain or SOB. She has a lot of pain throughout her body. She will see Dr. Cheryll Cockayne tomorrow for a rheumatology consult.    Review of Systems  Constitutional: Negative.   Respiratory: Negative.   Cardiovascular: Positive for leg swelling. Negative for chest pain and palpitations.  Musculoskeletal: Positive for arthralgias.       Objective:   Physical Exam  Constitutional: She appears well-developed and well-nourished.  Cardiovascular: Normal rate, regular rhythm, normal heart sounds and intact distal pulses.   Pulmonary/Chest: Effort normal and breath sounds normal. No respiratory distress. She has no wheezes. She has no rales.  Musculoskeletal:  2+ edema in both lower legs and feet           Assessment & Plan:  Add Lasix 20 mg each morning. Refilled Vicodin. We will see what Dr. Juanetta Gosling wants to do.

## 2013-04-22 ENCOUNTER — Telehealth: Payer: Self-pay | Admitting: Family Medicine

## 2013-04-22 NOTE — Telephone Encounter (Signed)
Patient Information:  Caller Name: Merin  Phone: [phone]  Patient: [name]  Gender: Female  DOB: Jan 03, 1956  Age: 57 Years  PCP: N/A  Office Follow Up:  Does the office need to follow up with this patient?: Yes  Instructions For The Office: Please call ASAP regarding MD recommended disposition.  RN Note:  Both feet are swollen and hot and left foot is worse and tender to touch from edema.  No redness. Asking if antibiotic should be ordered and if should begin taking the Lasix.  Since patient saw MD on 10/27 and specialist on 10/28, RN called office to notify Doy Hutching about situation and dispostion.  Requested triage information be sent over now, she will review with MD and call patient back.  Symptoms  Reason For Call & Symptoms: Saw Rheumatologist  for arm pain on 04/21/13.  Had xrays of hands and feet.  Rheumatologist office called 04/22/13  and reported she has a left foot "infection."  They are referring for MRI but it is not yet scheduled.  She is calling because she thinks an antibiotic should be ordered for the infection.  Dr Clent Ridges saw her 04/20/13 for foot edema.  Lasix was ordred but not yet started.  Reviewed Health History In EMR: Yes  Reviewed Medications In EMR: Yes  Reviewed Allergies In EMR: Yes  Reviewed Surgeries / Procedures: Yes  Date of Onset of Symptoms: 04/22/2013  Guideline(s) Used:  Leg Swelling and Edema  Disposition Per Guideline:   Go to ED Now (or to Office with PCP Approval)  Reason For Disposition Reached:   Thigh, calf, or ankle swelling in both legs, but one side is definitely more swollen  Advice Given:  N/A  RN Overrode Recommendation:  Follow Up With Office Later  Office to call patient regarding MD recommended disposition.

## 2013-04-22 NOTE — Telephone Encounter (Signed)
She needs to follow up with the rheumatologist. If they think her foot is infected then they need to treat it. I cannot see the Xrays that they took in the office

## 2013-04-22 NOTE — Telephone Encounter (Signed)
I spoke with pt and she is going to call the rheumatologist back and ask about the antibiotic.

## 2013-04-23 ENCOUNTER — Other Ambulatory Visit: Payer: Self-pay | Admitting: Rheumatology

## 2013-04-23 DIAGNOSIS — M7989 Other specified soft tissue disorders: Secondary | ICD-10-CM

## 2013-04-28 ENCOUNTER — Telehealth: Payer: Self-pay | Admitting: Family Medicine

## 2013-04-28 ENCOUNTER — Telehealth (INDEPENDENT_AMBULATORY_CARE_PROVIDER_SITE_OTHER): Payer: Self-pay | Admitting: *Deleted

## 2013-04-28 NOTE — Telephone Encounter (Signed)
Pt has been re certified for med teaching on her lasix . Would also like to request phy therapy eval due to a recent fall Fax  [phone]

## 2013-04-28 NOTE — Telephone Encounter (Signed)
FYIBritta [name] , home health nurse, called to let Dr. Biagio Quint know that patient has been having decreased to no output from her colostomy.  She states patient is still passing gas into the bag.  They are attributing this to patient recently being placed on Lasix and have encouraged patient to increase hydration.  [name] stated she just wanted Dr. Biagio Quint to be aware however they will call back if this persists.

## 2013-04-29 NOTE — Telephone Encounter (Signed)
Per Dr. Clent Ridges okay to give verbal orders for the below requests. I did talk with Britta Mccreedy and gave verbal order.

## 2013-04-29 NOTE — Telephone Encounter (Signed)
Please get more info.

## 2013-04-29 NOTE — Telephone Encounter (Signed)
Pt calling to report the same thing that she is having problems with the colostomy. The pt is not having much output in the colostomy since two days a go. The pt is having gas and just a little amount like the size of droplets in the bag. The pt wants to know what to do. Please advise.

## 2013-05-01 NOTE — Telephone Encounter (Signed)
Called and spoke to patient regarding her colostomy concerns.  Patient reports that she's having bowel movements at this time.  Patient passing gas without difficulty.  Patient denies having any abdominal pain, distention or fevers.  Patient advised to call our office or report to the nearest ED for further evaluation if she should have any change in symptoms such as abdominal pain, bloating or distention, fevers, decrease or no output in colostomy or not passing gas.  Patient verbalized understanding.

## 2013-05-05 ENCOUNTER — Ambulatory Visit
Admission: RE | Admit: 2013-05-05 | Discharge: 2013-05-05 | Disposition: A | Discharge status: Home / Self Care | Payer: Medicare Other | Source: Ambulatory Visit | Attending: Rheumatology | Admitting: Rheumatology

## 2013-05-05 DIAGNOSIS — M7989 Other specified soft tissue disorders: Secondary | ICD-10-CM

## 2013-05-05 MED ORDER — GADOBENATE DIMEGLUMINE 529 MG/ML IV SOLN
13.0000 mL | Freq: Once | INTRAVENOUS | Status: AC | PRN
  Administered 2013-05-05: 13 mL via INTRAVENOUS

## 2013-05-11 ENCOUNTER — Other Ambulatory Visit: Payer: Medicare Other

## 2013-05-15 ENCOUNTER — Telehealth: Payer: Self-pay | Admitting: Family Medicine

## 2013-05-15 NOTE — Telephone Encounter (Signed)
Please get more info about this. I have not been contacted by anyone about medical equipment

## 2013-05-15 NOTE — Telephone Encounter (Signed)
[employer] called to follow up on a prior auth for patients wheelchair and shower chair. Please call pt once this has been resolved. Thanks

## 2013-05-15 NOTE — Telephone Encounter (Signed)
Did you already order the below equipment for pt? I did not see anything scanned under media.

## 2013-05-18 ENCOUNTER — Telehealth: Payer: Self-pay | Admitting: Family Medicine

## 2013-05-18 NOTE — Telephone Encounter (Signed)
I spoke with [name] and she is going to follow up on this and also call pt.

## 2013-05-18 NOTE — Telephone Encounter (Signed)
Spoke to Dillard with [facility], he states that the patient may not be able to obtain her medical equipment if it is being prescribed from an out of network equipment company. Where is the medical equipment coming from?  I can call back and see if it is network.

## 2013-05-19 DIAGNOSIS — M069 Rheumatoid arthritis, unspecified: Secondary | ICD-10-CM

## 2013-05-19 DIAGNOSIS — H548 Legal blindness, as defined in USA: Secondary | ICD-10-CM

## 2013-05-19 DIAGNOSIS — I10 Essential (primary) hypertension: Secondary | ICD-10-CM

## 2013-05-19 NOTE — Telephone Encounter (Signed)
Pt needs script for wheelchair and shower chair, pls fax to medical supply store in [place]. Thank you.

## 2013-05-20 NOTE — Telephone Encounter (Signed)
The script was wrote and I faxed it to [facility] @ [phone], and phone # is [phone]. I spoke with [facility] and they do not accept pt's insurance type, they advised to call [facility]. They will contact pt with further instruction.

## 2013-05-23 ENCOUNTER — Emergency Department (HOSPITAL_COMMUNITY): Payer: Medicare Other

## 2013-05-23 ENCOUNTER — Emergency Department (HOSPITAL_COMMUNITY)
Admission: EM | Admit: 2013-05-23 | Discharge: 2013-05-23 | Disposition: A | Discharge status: Home / Self Care | Payer: Medicare Other | Attending: Emergency Medicine | Admitting: Emergency Medicine

## 2013-05-23 ENCOUNTER — Encounter (HOSPITAL_COMMUNITY): Payer: Self-pay | Admitting: Emergency Medicine

## 2013-05-23 DIAGNOSIS — M1A00X0 Idiopathic chronic gout, unspecified site, without tophus (tophi): Secondary | ICD-10-CM | POA: Insufficient documentation

## 2013-05-23 DIAGNOSIS — M810 Age-related osteoporosis without current pathological fracture: Secondary | ICD-10-CM | POA: Insufficient documentation

## 2013-05-23 DIAGNOSIS — I10 Essential (primary) hypertension: Secondary | ICD-10-CM | POA: Insufficient documentation

## 2013-05-23 DIAGNOSIS — Z789 Other specified health status: Secondary | ICD-10-CM | POA: Insufficient documentation

## 2013-05-23 DIAGNOSIS — M549 Dorsalgia, unspecified: Secondary | ICD-10-CM

## 2013-05-23 DIAGNOSIS — S22000A Wedge compression fracture of unspecified thoracic vertebra, initial encounter for closed fracture: Secondary | ICD-10-CM

## 2013-05-23 DIAGNOSIS — Z9889 Other specified postprocedural states: Secondary | ICD-10-CM | POA: Insufficient documentation

## 2013-05-23 DIAGNOSIS — Z87891 Personal history of nicotine dependence: Secondary | ICD-10-CM | POA: Insufficient documentation

## 2013-05-23 DIAGNOSIS — M1A9XX0 Chronic gout, unspecified, without tophus (tophi): Secondary | ICD-10-CM | POA: Insufficient documentation

## 2013-05-23 DIAGNOSIS — Z96659 Presence of unspecified artificial knee joint: Secondary | ICD-10-CM | POA: Insufficient documentation

## 2013-05-23 DIAGNOSIS — Z79899 Other long term (current) drug therapy: Secondary | ICD-10-CM | POA: Insufficient documentation

## 2013-05-23 DIAGNOSIS — M79609 Pain in unspecified limb: Secondary | ICD-10-CM | POA: Insufficient documentation

## 2013-05-23 DIAGNOSIS — N390 Urinary tract infection, site not specified: Secondary | ICD-10-CM | POA: Insufficient documentation

## 2013-05-23 DIAGNOSIS — Z8673 Personal history of transient ischemic attack (TIA), and cerebral infarction without residual deficits: Secondary | ICD-10-CM | POA: Insufficient documentation

## 2013-05-23 DIAGNOSIS — Z792 Long term (current) use of antibiotics: Secondary | ICD-10-CM | POA: Insufficient documentation

## 2013-05-23 DIAGNOSIS — Z7983 Long term (current) use of bisphosphonates: Secondary | ICD-10-CM | POA: Insufficient documentation

## 2013-05-23 DIAGNOSIS — H548 Legal blindness, as defined in USA: Secondary | ICD-10-CM | POA: Insufficient documentation

## 2013-05-23 DIAGNOSIS — M8448XA Pathological fracture, other site, initial encounter for fracture: Secondary | ICD-10-CM | POA: Insufficient documentation

## 2013-05-23 DIAGNOSIS — H9190 Unspecified hearing loss, unspecified ear: Secondary | ICD-10-CM | POA: Insufficient documentation

## 2013-05-23 DIAGNOSIS — M1990 Unspecified osteoarthritis, unspecified site: Secondary | ICD-10-CM

## 2013-05-23 DIAGNOSIS — Z8719 Personal history of other diseases of the digestive system: Secondary | ICD-10-CM | POA: Insufficient documentation

## 2013-05-23 DIAGNOSIS — Z8742 Personal history of other diseases of the female genital tract: Secondary | ICD-10-CM | POA: Insufficient documentation

## 2013-05-23 LAB — URINALYSIS, ROUTINE W REFLEX MICROSCOPIC
Bilirubin Urine: NEGATIVE
Glucose, UA: NEGATIVE mg/dL
Ketones, ur: NEGATIVE mg/dL
Nitrite: NEGATIVE
Protein, ur: NEGATIVE mg/dL
Specific Gravity, Urine: 1.028 (ref 1.005–1.030)
Urobilinogen, UA: 1 mg/dL (ref 0.0–1.0)
pH: 7 (ref 5.0–8.0)

## 2013-05-23 LAB — BASIC METABOLIC PANEL
BUN: 14 mg/dL (ref 6–23)
CO2: 22 mEq/L (ref 19–32)
Calcium: 9.7 mg/dL (ref 8.4–10.5)
Chloride: 100 mEq/L (ref 96–112)
Creatinine, Ser: 0.79 mg/dL (ref 0.50–1.10)
GFR calc Af Amer: >90 mL/min (ref >90)
GFR calc non Af Amer: >90 mL/min (ref >90)
Glucose, Bld: 122 mg/dL — ABNORMAL HIGH (ref 70–99)
Potassium: 3.4 mEq/L — ABNORMAL LOW (ref 3.5–5.1)
Sodium: 137 mEq/L (ref 135–145)

## 2013-05-23 LAB — CBC WITH DIFFERENTIAL/PLATELET
Basophils Absolute: 0 10*3/uL (ref 0.0–0.1)
Basophils Relative: 0 % (ref 0–1)
Eosinophils Absolute: 0.1 10*3/uL (ref 0.0–0.7)
Eosinophils Relative: 1 % (ref 0–5)
HCT: 32.4 % — ABNORMAL LOW (ref 36.0–46.0)
Hemoglobin: 10.6 g/dL — ABNORMAL LOW (ref 12.0–15.0)
Lymphocytes Relative: 18 % (ref 12–46)
Lymphs Abs: 1.9 10*3/uL (ref 0.7–4.0)
MCH: 28 pg (ref 26.0–34.0)
MCHC: 32.7 g/dL (ref 30.0–36.0)
MCV: 85.7 fL (ref 78.0–100.0)
Monocytes Absolute: 0.7 10*3/uL (ref 0.1–1.0)
Monocytes Relative: 7 % (ref 3–12)
Neutro Abs: 7.6 10*3/uL (ref 1.7–7.7)
Neutrophils Relative %: 74 % (ref 43–77)
Platelets: 303 10*3/uL (ref 150–400)
RBC: 3.78 MIL/uL — ABNORMAL LOW (ref 3.87–5.11)
RDW: 15.7 % — ABNORMAL HIGH (ref 11.5–15.5)
WBC: 10.3 10*3/uL (ref 4.0–10.5)

## 2013-05-23 LAB — URINE MICROSCOPIC-ADD ON

## 2013-05-23 LAB — TROPONIN I: Troponin I: <0.3 ng/mL (ref <0.30)

## 2013-05-23 LAB — D-DIMER, QUANTITATIVE: D-Dimer, Quant: 14.25 ug/mL-FEU — ABNORMAL HIGH (ref 0.00–0.48)

## 2013-05-23 MED ORDER — CEPHALEXIN 500 MG PO CAPS
500.0000 mg | ORAL_CAPSULE | Freq: Once | ORAL | Status: AC
  Administered 2013-05-23: 500 mg via ORAL
  Filled 2013-05-23: qty 1

## 2013-05-23 MED ORDER — IOHEXOL 350 MG/ML SOLN
100.0000 mL | Freq: Once | INTRAVENOUS | Status: AC | PRN
  Administered 2013-05-23: 100 mL via INTRAVENOUS

## 2013-05-23 MED ORDER — CEPHALEXIN 500 MG PO CAPS: 500.0000 mg | ORAL_CAPSULE | Freq: Four times a day (QID) | ORAL | Status: DC

## 2013-05-23 MED ORDER — OXYCODONE-ACETAMINOPHEN 5-325 MG PO TABS
1.0000 | ORAL_TABLET | Freq: Once | ORAL | Status: AC
  Administered 2013-05-23: 1 via ORAL
  Filled 2013-05-23: qty 1

## 2013-05-23 NOTE — ED Notes (Signed)
Bed: WU98 Expected date:  Expected time:  Means of arrival:  Comments: Sob, hx asthma

## 2013-05-23 NOTE — ED Notes (Addendum)
Per EMS: pt c/o SOB since last night, wheezing in right upper. Pt A&O x 4, no respiratory distress upon arrival. Has back pain mid scapular area 10/10. 5 Albuterol in route

## 2013-05-23 NOTE — ED Notes (Signed)
Pt made aware of need for UA 

## 2013-05-23 NOTE — ED Provider Notes (Signed)
CSN: [id number]     Arrival date & time 05/23/13  0804 History   First MD Initiated Contact with Patient 05/23/13 [phone]     Chief Complaint  Patient presents with  . Shortness of Breath   (Consider location/radiation/quality/duration/timing/severity/associated sxs/prior Treatment) HPI Comments: [name] is a 57 y.o. female who presents for evaluation of pain in upper back, and left arm. This discomfort started last night. The discomfort is worse with movement of the arms, and breathing. She took a Tylenol with partial relief of the pain, this morning. She denies shortness of breath, cough, fever, nausea, vomiting, or weakness. She's taking her usual medications, without relief. He has not had this problem previously. There are no other known modifying factors.   Patient is a 57 y.o. female presenting with shortness of breath. The history is provided by the patient.  Shortness of Breath   Past Medical History  Diagnosis Date  . Legally blind     since pt was a teenager  . Hearing aid worn     pt wears bilateral hearing aids  . Marfan's syndrome affecting skin     with scolosis  . Osteoporosis   . Hypertension   . Substance abuse     recovering alcoholic x12 years   . Hernia 4/08  . 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 Chronic gout 2008  . Stroke 2/08  . Fibroid   . Status post bunionectomy 1/10    bilateral   . Diverticulitis of large intestine with perforation   . Small bowel obstruction due to adhesions    Past Surgical History  Procedure Laterality Date  . Bunionectomy Bilateral 1/10  . Total knee arthroplasty Bilateral 6/08  . Hernia repair    . Laparotomy N/A 11/27/2012    Procedure: EXPLORATORY LAPAROTOMY SIGMOID COLECTOMY, COLOSTOMY;  Surgeon: Lodema Pilot, DO;  Location: [facility];  Service: General;  Laterality: N/A;  . Colostomy    . Laparoscopic abdominal exploration N/A 01/06/2013    Procedure: LAPAROSCOPIC ABDOMINAL EXPLORATION;  Surgeon:  Ardeth Sportsman, MD;  Location: [facility];  Service: General;  Laterality: N/A;  . Laparoscopic lysis of adhesions N/A 01/06/2013    Procedure: LAPAROSCOPIC LYSIS OF ADHESIONS/ INTEROTOMY REPAIR;  Surgeon: Ardeth Sportsman, MD;  Location: [facility];  Service: General;  Laterality: N/A;  . Colon surgery      colectomy   No family history on file. History  Substance Use Topics  . Smoking status: Former Smoker -- 1.00 packs/day    Types: Cigarettes    Quit date: 11/27/2012  . Smokeless tobacco: Never Used  . Alcohol Use: No     Comment: recovering alcoholic   OB History   Grav Para Term Preterm Abortions TAB SAB Ect Mult Living   1 1 1       1      Review of Systems  Respiratory: Positive for shortness of breath.   All other systems reviewed and are negative.    Allergies  Review of patient's allergies indicates no known allergies.  Home Medications   Current Outpatient Rx  Name  Route  Sig  Dispense  Refill  . albuterol (PROVENTIL HFA;VENTOLIN HFA) 108 (90 BASE) MCG/ACT inhaler   Inhalation   Inhale 1 puff into the lungs every 6 (six) hours as needed for wheezing or shortness of breath.         . allopurinol (ZYLOPRIM) 100 MG tablet      TAKE ONE  TABLET BY MOUTH ONCE DAILY   30 tablet   11   . amLODipine (NORVASC) 10 MG tablet      take 1 tablet by mouth once daily   30 tablet   5   . Calcium Carbonate-Vitamin D (CALCIUM + D PO)   Oral   Take 1,200 mg by mouth daily.         . cetirizine (ZYRTEC) 10 MG tablet   Oral   Take 10 mg by mouth daily.         . Cyanocobalamin (VITAMIN B 12 PO)   Oral   Take 1 tablet by mouth daily.         Marland Kitchen docusate sodium (COLACE) 100 MG capsule   Oral   Take 100 mg by mouth 2 (two) times daily.           Marland Kitchen esomeprazole (NEXIUM) 40 MG capsule   Oral   Take 1 capsule (40 mg total) by mouth 2 (two) times daily.   60 capsule   11     PLEASE RESPOND   . furosemide (LASIX) 20 MG tablet   Oral   Take 1 tablet (20 mg  total) by mouth daily.   90 tablet   3   . HYDROcodone-acetaminophen (NORCO) 10-325 MG per tablet   Oral   Take 1 tablet by mouth every 6 (six) hours as needed for pain.   120 tablet   0     May fill on 06-20-13   . megestrol (MEGACE ES) 625 MG/5ML suspension   Oral   Take 5 mL (625 mg total) by mouth 2 (two) times daily before a meal.   150 mL   5   . methocarbamol (ROBAXIN) 500 MG tablet   Oral   Take 500 mg by mouth 4 (four) times daily.         . Multiple Vitamins-Minerals (MULTIVITAMIN PO)   Oral   Take by mouth daily.         . risedronate (ACTONEL) 150 MG tablet   Oral   Take 150 mg by mouth every 30 (thirty) days. with water on empty stomach, nothing by mouth or lie down for next 30 minutes.         . traMADol (ULTRAM) 50 MG tablet      take 1 to 2 tablets by mouth every 6 hours if needed for pain   120 tablet   5   . vitamin C (ASCORBIC ACID) 500 MG tablet   Oral   Take 500 mg by mouth daily.         . cephALEXin (KEFLEX) 500 MG capsule   Oral   Take 1 capsule (500 mg total) by mouth 4 (four) times daily.   28 capsule   0    BP 146/80  Pulse 109  Temp(Src) 98.1 F (36.7 C) (Oral)  Resp 13  SpO2 97%  LMP 06/25/2006 Physical Exam  Nursing note and vitals reviewed. Constitutional: She is oriented to person, place, and time. She appears well-developed.  Frail, appears older than stated age. Poor hygiene.  HENT:  Head: Normocephalic and atraumatic.  Eyes:  Left eye prosthesis. Right globe is atrophic and the cornea is opacified, no drainage from either eye.  Neck: Normal range of motion and phonation normal. Neck supple.  Cardiovascular: Normal rate, regular rhythm and intact distal pulses.   Pulmonary/Chest: Effort normal. She exhibits no tenderness.  Decreased turgor bilaterally without audible wheezes, Rales, or rhonchi.  Abdominal: Soft. She exhibits no distension. There is no tenderness. There is no guarding.  Genitourinary:  No  costovertebral angle tenderness  Musculoskeletal: Normal range of motion. She exhibits no edema.  Marked thoracolumbar scoliosis, and kyphosis. She is tender in the upper back, bilaterally. There is no significant mid or low back tenderness. She is able to sit up in the stretcher, without low back pain.  Neurological: She is alert and oriented to person, place, and time. She exhibits normal muscle tone.  Skin: Skin is warm and dry.  Psychiatric: She has a normal mood and affect. Her behavior is normal. Judgment and thought content normal.    ED Course  Procedures (including critical care time) Medications  oxyCODONE-acetaminophen (PERCOCET/ROXICET) 5-325 MG per tablet 1 tablet (1 tablet Oral Given 05/23/13 0829)  iohexol (OMNIPAQUE) 350 MG/ML injection 100 mL (100 mLs Intravenous Contrast Given 05/23/13 1018)  cephALEXin (KEFLEX) capsule 500 mg (500 mg Oral Given 05/23/13 1246)    Patient Vitals for the past 24 hrs:  BP Temp Temp src Pulse Resp SpO2  05/23/13 1244 146/80 mmHg - - 109 13 97 %  05/23/13 1125 141/62 mmHg 98.1 F (36.7 C) Oral 100 15 95 %  05/23/13 1120 145/62 mmHg 99.2 F (37.3 C) Oral 95 14 95 %  05/23/13 0954 165/97 mmHg - - - - -  05/23/13 0953 171/90 mmHg - - - - -  05/23/13 0808 167/88 mmHg 100.4 F (38 C) Oral 98 16 100 %   1:02 PM Reevaluation with update and discussion. After initial assessment and treatment, an updated evaluation reveals she denies further problems. She is comfortable, now. Vital signs are stable. All questions answered.Mancel Bale L     Labs Review Labs Reviewed  CBC WITH DIFFERENTIAL - Abnormal; Notable for the following:    RBC 3.78 (*)    Hemoglobin 10.6 (*)    HCT 32.4 (*)    RDW 15.7 (*)    All other components within normal limits  BASIC METABOLIC PANEL - Abnormal; Notable for the following:    Potassium 3.4 (*)    Glucose, Bld 122 (*)    All other components within normal limits  URINALYSIS, ROUTINE W REFLEX MICROSCOPIC -  Abnormal; Notable for the following:    APPearance CLOUDY (*)    Hgb urine dipstick MODERATE (*)    Leukocytes, UA MODERATE (*)    All other components within normal limits  D-DIMER, QUANTITATIVE - Abnormal; Notable for the following:    D-Dimer, Quant 14.25 (*)    All other components within normal limits  URINE MICROSCOPIC-ADD ON - Abnormal; Notable for the following:    Squamous Epithelial / LPF FEW (*)    Bacteria, UA MANY (*)    Crystals CA OXALATE CRYSTALS (*)    All other components within normal limits  URINE CULTURE  TROPONIN I   Imaging Review Dg Chest 2 View  05/23/2013   CLINICAL DATA:  Shortness of breath, former smoker  EXAM: CHEST  2 VIEW  COMPARISON:  06/17/2007; 07/27/2006; chest CT -06/18/2007  FINDINGS: Grossly unchanged borderline enlarged cardiac silhouette and mediastinal contours. No focal airspace opacities. No pleural effusion or pneumothorax. No definite evidence of edema. Grossly unchanged bones including severe scoliotic curvature of the thoracolumbar spine and advanced degenerative change of the bilateral glenohumeral joints.  IMPRESSION: No acute cardiopulmonary disease.   Electronically Signed   By: Simonne Come M.D.   On: 05/23/2013 09:01   Ct Angio Chest Pe W/cm &/or Wo  Cm  05/23/2013   CLINICAL DATA:  Shortness of breath, back pain, Marfan's syndrome.  EXAM: CT ANGIOGRAPHY CHEST WITH CONTRAST  TECHNIQUE: Multidetector CT imaging of the chest was performed using the standard protocol during bolus administration of intravenous contrast. Multiplanar CT image reconstructions including MIPs were obtained to evaluate the vascular anatomy.  CONTRAST:  OMNIPAQUE IOHEXOL 350 MG/ML SOLN  COMPARISON:  06/18/2007  FINDINGS: Satisfactory opacification of pulmonary arteries noted, and there is no evidence of pulmonary emboli. Patchy coronary and aortic calcifications. Adequate contrast opacification of the thoracic aorta with no evidence of dissection, aneurysm, or  stenosis. There is classic 3-vessel brachiocephalic arch anatomy without proximal stenosis. No pleural or pericardial effusion. 17 x 28 mm anterior mediastinal mass, well-marginated, enlarged from prior study (12 x 26 mm) no hilar adenopathy. Subpleural linear opacities in posteriorly in both lower lobes may represent scar or dependent subsegmental atelectasis. 16 mm low-attenuation right thyroid lesion. Visualized portions of upper abdomen unremarkable. Thoracolumbar scoliosis with spondylitic changes in the lower thoracic spine. Mild compression fracture deformity of T10 with estimated 20% loss of height anteriorly, no retropulsion or posterior element involvement, new since prior scan. Advanced degenerative changes in shoulders with question of free osteochondral bodies bilaterally.  Review of the MIP images confirms the above findings.  IMPRESSION: 1. Negative for acute PE or thoracic aortic dissection. 2. New T10 compression fracture deformity. 3. Atherosclerosis, including coronary artery disease. Please note that although the presence of coronary artery calcium documents the presence of coronary artery disease, the severity of this disease and any potential stenosis cannot be assessed on this non-gated CT examination. Assessment for potential risk factor modification, dietary therapy or pharmacologic therapy may be warranted, if clinically indicated. 4. 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 5. Right thyroid nodule. Consider elective thyroid ultrasound when stable for further characterization.   Electronically Signed   By: Oley Balm M.D.   On: 05/23/2013 11:12    EKG Interpretation    Date/Time:  Saturday May 23 2013 08:34:51 EST Ventricular Rate:  95 PR Interval:  107 QRS Duration: 74 QT Interval:  368 QTC Calculation: 463 R Axis:   -3 Text Interpretation:  Sinus tachycardia Supraventricular bigeminy Short PR interval  Left atrial enlargement Abnormal R-wave progression, early transition Left ventricular hypertrophy since last tracing no significant change Confirmed by Audre Cenci  MD, Kemper Heupel ([id number]) on 05/23/2013 9:26:17 AM            MDM   1. Back pain   2. UTI (lower urinary tract infection)   3. Degenerative joint disease   4. Thoracic compression fracture, closed, initial encounter    Multifactorial back pain related to degenerative joint disease, as well as likely new thoracic compression fracture. 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 Incidental note is made of urinary tract infection and treatment has been started in the emergency department. She has pain in his use of home. She is stable for discharge with outpatient management.   Nursing Notes Reviewed/ Care Coordinated, and agree without changes. Applicable Imaging Reviewed.  Interpretation of Laboratory Data incorporated into ED treatment   Plan: Home Medications- Keflex; Home Treatments and Observation- rest; return here if the recommended treatment, does not improve the symptoms; Recommended follow up- PCP, 5 days, sooner when necessary      Flint Melter, MD 05/23/13 1303

## 2013-05-27 LAB — URINE CULTURE
Colony Count: >=100000
Special Requests: NORMAL

## 2013-05-28 ENCOUNTER — Other Ambulatory Visit: Payer: Self-pay | Admitting: Family Medicine

## 2013-05-28 NOTE — ED Notes (Signed)
Post ED Visit - Positive Culture Follow-up: Successful Patient Follow-Up  Culture assessed and recommendations reviewed by: []  Wes Dulaney, Pharm.D., BCPS [x]  Celedonio Miyamoto, Pharm.D., BCPS []  Georgina Pillion, Pharm.D., BCPS []  [place], [place].D., BCPS, AAHIVP []  Estella Husk, Pharm.D., BCPS, AAHIVP  [X]  Treated with cephalexin, organism resistant to prescribed antimicrobial  [ ]  Patient discharged originally without antimicrobial agent and treatment is now indicated  New antibiotic prescription: Amoxicillin 250mg  po TID x 7 days  ED Provider: Johnnette Gourd, PA-C    [name] 05/28/2013, 10:44 PM

## 2013-05-28 NOTE — Progress Notes (Signed)
ED Antimicrobial Stewardship Positive Culture Follow Up   [name] is an 57 y.o. female who presented to [facility] on 05/23/2013 with a chief complaint of  Chief Complaint  Patient presents with  . Shortness of Breath    Recent Results (from the past 720 hour(s))  URINE CULTURE     Status: None   Collection Time    05/23/13  9:53 AM      Result Value Range Status   Specimen Description URINE, CLEAN CATCH   Final   Special Requests Normal   Final   Culture  Setup Time     Final   Value: 05/23/2013 14:03     Performed at [facility] Count     Final   Value: >=100,000 COLONIES/ML     Performed at [facility]   Culture     Final   Value: ENTEROCOCCUS SPECIES     Performed at [facility]   Report Status 05/27/2013 FINAL   Final   Organism ID, Bacteria ENTEROCOCCUS SPECIES   Final    [x]  Treated with cephalexin, organism resistant to prescribed antimicrobial []  Patient discharged originally without antimicrobial agent and treatment is now indicated  New antibiotic prescription: Amoxicillin 250mg  po TID x 7 days  ED Provider: Johnnette Gourd, PA-C   [name] 05/28/2013, 9:55 AM Infectious Diseases Pharmacist Phone# [phone]

## 2013-05-29 ENCOUNTER — Telehealth: Payer: Self-pay | Admitting: Family Medicine

## 2013-05-29 NOTE — Telephone Encounter (Signed)
Pt needs post er fup. Has to be on a mon, between 10 am and 2 pm. First available is 12/15. Is it ok to schedule? Pt had uti and back pain.

## 2013-05-29 NOTE — Telephone Encounter (Signed)
Patient Information:  Caller Name: Cyndra  Phone: [phone]  Patient: Christina, [name]  Gender: Female  DOB: 1956/04/28  Age: 57 Years  PCP: [facility])  Office Follow Up:  Does the office need to follow up with this patient?: No  Instructions For The Office: N/A  RN Note:  Transferred caller to the front desk to schedule ER follow up appointment. Office staff to assist patient with appt needs  Symptoms  Reason For Call & Symptoms: Patient calling to make an appt for follow up from ER visit on Saturday 05/23/13 for UTI and back pain (Thoracic Compression fracture) .   Still on antibiotics for UTI . She is better. Urinary Protocol reviewed.  The back pain is off/on but is better, she already had pain medication at home. Marland Kitchen She was told to follow up with the physician. .  Reviewed Health History In EMR: Yes  Reviewed Medications In EMR: Yes  Reviewed Allergies In EMR: Yes  Reviewed Surgeries / Procedures: Yes  Date of Onset of Symptoms: 05/23/2013  Treatments Tried: Keflex  Treatments Tried Worked: No  Guideline(s) Used:  Urination Pain - Female  No Protocol Available - Information Only  Disposition Per Guideline:   Home Care  Reason For Disposition Reached:   Information only question and nurse able to answer  Advice Given:  Call Back If:  New symptoms develop  You become worse.  Patient Will Follow Care Advice:  YES

## 2013-05-29 NOTE — Telephone Encounter (Signed)
Pt has appt for 06/08/13 to see Dr. Clent Ridges

## 2013-05-29 NOTE — Telephone Encounter (Signed)
Done kh

## 2013-05-29 NOTE — Telephone Encounter (Signed)
Per Dr. Fry, okay to schedule. 

## 2013-05-29 NOTE — Telephone Encounter (Signed)
lmom 

## 2013-06-02 ENCOUNTER — Telehealth: Payer: Self-pay | Admitting: Family Medicine

## 2013-06-02 NOTE — Telephone Encounter (Signed)
Britta Mccreedy rn form [facility] is requesting additional visit due to recent er visit. Britta Mccreedy would like order to check ua for uti and back compression fx

## 2013-06-03 ENCOUNTER — Encounter: Payer: Self-pay | Admitting: *Deleted

## 2013-06-03 ENCOUNTER — Telehealth: Payer: Self-pay | Admitting: Family Medicine

## 2013-06-03 NOTE — Telephone Encounter (Signed)
I spoke with Britta Mccreedy and gave below information.

## 2013-06-03 NOTE — Telephone Encounter (Signed)
I left a voice message with the below information. 

## 2013-06-03 NOTE — Telephone Encounter (Signed)
Have her see [place] tomorrow. In the meantime stay on Keflex

## 2013-06-03 NOTE — Telephone Encounter (Signed)
Patient Information:  Caller Name: Nanako  Phone: [phone]  Patient: Roland Rack I  Gender: Female  DOB: 09-Sep-1955  Age: 57 Years  PCP: [facility])  Office Follow Up:  Does the office need to follow up with this patient?: Yes  Instructions For The Office: Call back regarding appointment with Dr Clent Ridges.  RN Note:  Quarter sized painful, non draining lesion on ball of left footappears infected with pus and blood visible inside.  Currently on Keflex per ED for UTI; ED changed antibiotic to Amox but she has not yet started it.  No appointments remain with Dr Clent Ridges.  Message sent to office staff to call patient about an appointment.    Symptoms  Reason For Call & Symptoms: Painful blister filled with pus & blood in arch of left foot.  Reviewed Health History In EMR: Yes  Reviewed Medications In EMR: Yes  Reviewed Allergies In EMR: Yes  Reviewed Surgeries / Procedures: Yes  Date of Onset of Symptoms: 05/13/2013  Treatments Tried: wore extra shoe pads  Treatments Tried Worked: No  Guideline(s) Used:  Skin Injury  Wound Infection  Disposition Per Guideline:   Go to Office Now  Reason For Disposition Reached:   Severe pain in the wound  Advice Given:  Warm Soaks or Local Heat:  If the wound is open, soak it in warm water or put a warm wet cloth on the wound for 20 minutes 3 times per day. Use a warm saltwater solution containing 2 teaspoons of table salt per quart of water. If the wound is closed, apply a heating pad or warm, moist washcloth to the reddened area for 20 minutes 3 times per day.  Antibiotic Ointment:  Apply an antibiotic ointment 3 times a day. If the area could become dirty, cover with a Band-Aid or a clean gauze dressing.  Pain Medicines:  For pain relief, you can take either acetaminophen, ibuprofen, or naproxen.  They are over-the-counter (OTC) pain drugs. You can buy them at the drugstore.  Contagiousness:  For true wound infections, you  can return to work or school after any fever is gone and you have received antibiotics for 24 hours.  Call Back If:   Wound becomes more tender  Redness starts to spread  Pus, drainage, or fever occurs  You become worse  Patient Will Follow Care Advice:  YES

## 2013-06-03 NOTE — Telephone Encounter (Signed)
Please take care of this.  

## 2013-06-04 ENCOUNTER — Encounter: Payer: Self-pay | Admitting: Family Medicine

## 2013-06-04 ENCOUNTER — Ambulatory Visit (INDEPENDENT_AMBULATORY_CARE_PROVIDER_SITE_OTHER): Payer: Medicare Other | Admitting: Family Medicine

## 2013-06-04 VITALS — BP 152/88

## 2013-06-04 DIAGNOSIS — L02612 Cutaneous abscess of left foot: Secondary | ICD-10-CM

## 2013-06-04 DIAGNOSIS — L02619 Cutaneous abscess of unspecified foot: Secondary | ICD-10-CM

## 2013-06-04 MED ORDER — AMOXICILLIN 250 MG PO CAPS: 250.0000 mg | ORAL_CAPSULE | Freq: Three times a day (TID) | ORAL | Status: DC

## 2013-06-04 NOTE — Progress Notes (Signed)
   Subjective:    Patient ID: [name], female    DOB: 05-31-1956, 57 y.o.   MRN: [id number]  HPI Here for one week of a painful lump in the bottom of the left foot. No drainage or fever. She has a Risk analyst in [employer] named Dr. Prescott Parma but she has not seen her for several years.    Review of Systems  Constitutional: Negative.   Skin: Positive for wound.       Objective:   Physical Exam  Constitutional: She appears well-developed and well-nourished.  Skin:  There is a large callous on the sole of the left foot which is fluctuant underneath. No drainage seen. This is tender.           Assessment & Plan:  Foot callus which has become infected. The area was cleansed with alcohol and a small incision was made at the border with a scalpel. A small amount of bloody fluid was extracted. A culture was obtained. She is already on Amoxicillin so this should cover this until the culture returns. I asked her to see her Podiatrist about this callus which will need long term treatment.

## 2013-06-07 ENCOUNTER — Other Ambulatory Visit: Payer: Self-pay | Admitting: Family Medicine

## 2013-06-08 ENCOUNTER — Ambulatory Visit (INDEPENDENT_AMBULATORY_CARE_PROVIDER_SITE_OTHER): Payer: Medicare Other | Admitting: Family Medicine

## 2013-06-08 ENCOUNTER — Encounter: Payer: Self-pay | Admitting: Family Medicine

## 2013-06-08 VITALS — BP 144/84 | HR 92 | Temp 99.7°F

## 2013-06-08 DIAGNOSIS — M1990 Unspecified osteoarthritis, unspecified site: Secondary | ICD-10-CM

## 2013-06-08 DIAGNOSIS — J069 Acute upper respiratory infection, unspecified: Secondary | ICD-10-CM

## 2013-06-08 DIAGNOSIS — M069 Rheumatoid arthritis, unspecified: Secondary | ICD-10-CM

## 2013-06-08 LAB — WOUND CULTURE
Gram Stain: NONE SEEN
Gram Stain: NONE SEEN
Organism ID, Bacteria: NO GROWTH

## 2013-06-08 MED ORDER — HYDROCODONE-HOMATROPINE 5-1.5 MG/5ML PO SYRP: 5.0000 mL | ORAL_SOLUTION | ORAL | Status: DC | PRN

## 2013-06-08 MED ORDER — METHYLPREDNISOLONE ACETATE 80 MG/ML IJ SUSP
120.0000 mg | Freq: Once | INTRAMUSCULAR | Status: AC
  Administered 2013-06-08: 120 mg via INTRAMUSCULAR

## 2013-06-08 MED ORDER — ALLOPURINOL 100 MG PO TABS: ORAL_TABLET | ORAL | Status: DC

## 2013-06-08 NOTE — Progress Notes (Addendum)
   Subjective:    Patient ID: [name], female    DOB: Nov 15, 1955, 57 y.o.   MRN: [id number]  HPI Here for several things. First her foot callus is feeling a little better. The culture is showing no growth so far. Her hands and wrists are hurting much more lately and she responds well to steroid shots. Also she has had a dry cough for 2 days. She is still on Amoxicillin.    Review of Systems  Constitutional: Negative.   HENT: Positive for congestion. Negative for postnasal drip and sinus pressure.   Eyes: Negative.   Respiratory: Positive for cough.   Musculoskeletal: Positive for arthralgias and joint swelling.       Objective:   Physical Exam  Constitutional: She appears well-developed and well-nourished.  HENT:  Right Ear: External ear normal.  Left Ear: External ear normal.  Nose: Nose normal.  Mouth/Throat: Oropharynx is clear and moist.  Pulmonary/Chest: Effort normal and breath sounds normal.  Lymphadenopathy:    She has no cervical adenopathy.          Assessment & Plan:  She has a viral URI. Use cough meds and Mucinex prn. Given a steroid shot.   Pre visit review using our clinic review tool, if applicable. No additional management support is needed unless otherwise documented below in the visit note.

## 2013-06-08 NOTE — Addendum Note (Signed)
Addended by: Raj Janus T on: 06/08/2013 11:11 AM   Modules accepted: Orders

## 2013-06-15 ENCOUNTER — Ambulatory Visit (INDEPENDENT_AMBULATORY_CARE_PROVIDER_SITE_OTHER): Payer: Medicare Other | Admitting: Podiatry

## 2013-06-15 ENCOUNTER — Encounter: Payer: Self-pay | Admitting: Podiatry

## 2013-06-15 VITALS — BP 157/89 | HR 82

## 2013-06-15 DIAGNOSIS — B351 Tinea unguium: Secondary | ICD-10-CM

## 2013-06-15 DIAGNOSIS — M79609 Pain in unspecified limb: Secondary | ICD-10-CM

## 2013-06-15 DIAGNOSIS — M79673 Pain in unspecified foot: Secondary | ICD-10-CM

## 2013-06-15 DIAGNOSIS — L97509 Non-pressure chronic ulcer of other part of unspecified foot with unspecified severity: Secondary | ICD-10-CM

## 2013-06-15 NOTE — Progress Notes (Signed)
Assessment: 57 year old blind female presents with her son on a wheel chair for left foot problem.  She has had calluses and blisters, which was treated by her PCP office. Patient is here for follow up on the foot lesion left.  Objective: Pedal dorsalis pedis arteries are palpable.  Plantar callused lesion in dark red discolration under left foot at midfoot area. Upon debridement of outer layer, noted of moist red base. Both feet are flat with collapsed arch, sever lateral abduction of the midfoot on rearfoot.  Thick mycotic nails x 10.  Assessment: Pre ulcerative plantar callus left foot. Collapsed cuboid bone with plantar callus. Deforming Rheumatoid. Onychomycosis x 10.  Plan: The lesion was debrided.  Aperture pad applied. Patient is to keep the pad dry. Return in 3 weeks to repeat debridedment. All nails debrided.

## 2013-06-15 NOTE — Patient Instructions (Signed)
Seen for painful lesion under left foot. Dry ulcerated callus left foot. All debrided and padded. Return in 3 weeks.

## 2013-06-30 ENCOUNTER — Telehealth: Payer: Self-pay | Admitting: Family Medicine

## 2013-06-30 ENCOUNTER — Other Ambulatory Visit: Payer: Self-pay | Admitting: Family Medicine

## 2013-06-30 NOTE — Telephone Encounter (Signed)
Ms [name] would like you to call her in ref to pt getting more home care hours.  Through/for [facility] program ([facility])

## 2013-07-01 NOTE — Telephone Encounter (Signed)
Pt wants [place] to call [facility] CAP department [facility] ) at [phone] and try to get pt moved up on the waiting list.

## 2013-07-02 NOTE — Telephone Encounter (Signed)
I left a voice message for [name] to call back, also I spoke with [name]. Pt is already on list, however it is a waiting period and not sure how long it will be.

## 2013-07-07 ENCOUNTER — Encounter: Payer: Self-pay | Admitting: Podiatry

## 2013-07-07 ENCOUNTER — Ambulatory Visit (INDEPENDENT_AMBULATORY_CARE_PROVIDER_SITE_OTHER): Payer: Medicare Other | Admitting: Podiatry

## 2013-07-07 DIAGNOSIS — A5211 Tabes dorsalis: Secondary | ICD-10-CM

## 2013-07-07 DIAGNOSIS — M14679 Charcot's joint, unspecified ankle and foot: Secondary | ICD-10-CM

## 2013-07-07 DIAGNOSIS — L97509 Non-pressure chronic ulcer of other part of unspecified foot with unspecified severity: Secondary | ICD-10-CM

## 2013-07-07 NOTE — Progress Notes (Signed)
Assessment:  58 year old blind female presents with her daughter on a wheel chair for follow up on left foot ulcer.   Objective: Pedal dorsalis pedis arteries are palpable with cold feet.  Plantar callused lesion is dry with build up callus. This callus is under the area of collapsed Calcaneocuboid joint area.   Both feet are flat with collapsed arch, sever lateral abduction of the midfoot on rearfoot.   Assessment: Pre ulcerative plantar callus left foot healing well. Collapsed cuboid bone with plantar callus.  Deforming Rheumatoid.   Plan: The lesion was debrided.  Return in 6 weeks.

## 2013-07-07 NOTE — Patient Instructions (Addendum)
Follow up on left foot ulcer. Healed well. Area covered with dry thick callus. Return in 6 weeks.

## 2013-07-13 ENCOUNTER — Telehealth: Payer: Self-pay | Admitting: Family Medicine

## 2013-07-13 MED ORDER — HYDROCODONE-ACETAMINOPHEN 10-325 MG PO TABS: 1.0000 | ORAL_TABLET | Freq: Four times a day (QID) | ORAL | Status: DC | PRN

## 2013-07-13 NOTE — Telephone Encounter (Signed)
Pt states she needs a re-fill of HYDROcodone-acetaminophen (NORCO) 10-325 MG per tablet.  She said [facility] is coming tomorrow morning to pick-up her RX and would like for her to pick-up pt's rx.

## 2013-07-13 NOTE — Telephone Encounter (Signed)
Patient Information:  Caller Name: Richard  Phone: [phone]  Patient: [name], [name]  Gender: Female  DOB: 02/03/1956  Age: 58 Years  PCP: [name] [facility])  Office Follow Up:  Does the office need to follow up with this patient?: Yes  Instructions For The Office: Please let pt know per her request.   Symptoms  Reason For Call & Symptoms: Pt calling to see if Rx for her Vicodin will be ready for pick up by Lurlean Leyden, her caregiver in the am, 07/14/13. Pt is requesting a call back to make sure, as she has no other way to pick it up. Advised pt that note has been sent x 2.    Reviewed Health History In EMR: N/A  Reviewed Medications In EMR: N/A  Reviewed Allergies In EMR: N/A  Reviewed Surgeries / Procedures: N/A  Date of Onset of Symptoms: 07/13/2013  Guideline(s) Used:  No Protocol Available - Information Only  Disposition Per Guideline:   Discuss with PCP and Callback by Nurse Today  Reason For Disposition Reached:   Nursing judgment  Advice Given:  N/A  Patient Will Follow Care Advice:  YES

## 2013-07-13 NOTE — Telephone Encounter (Signed)
done

## 2013-07-14 NOTE — Telephone Encounter (Signed)
Script is ready for pick up and family is here to pick up.

## 2013-08-07 ENCOUNTER — Telehealth: Payer: Self-pay | Admitting: Family Medicine

## 2013-08-07 NOTE — Telephone Encounter (Signed)
Patient Information:  Caller Name: Christina [name] [name]  Phone: [phone]  Patient: Christina [name], [name]  Gender: Female  DOB: 28-May-1956  Age: 58 Years  PCP: [name] [facility])  Office Follow Up:  Does the office need to follow up with this patient?: No  Instructions For The Office: N/A   Symptoms  Reason For Call & Symptoms: Has been having hard stool since 2-10. Has caused irritation to stoma. Has noticed "knot" under stoma 2-13. Knot is size of "egg". Knot is painful if colostomy does not empty and presses against wafer. Cannot feel knot unless stands. Cannot feel when laying down. Denies pain. Stool has been hard.  Reviewed Health History In EMR: Yes  Reviewed Medications In EMR: Yes  Reviewed Allergies In EMR: Yes  Reviewed Surgeries / Procedures: Yes  Date of Onset of Symptoms: 08/07/2013  Guideline(s) Used:  Constipation  Disposition Per Guideline:   See Within 2 Weeks in Office  Reason For Disposition Reached:   Uses enema or laxative (e.g., lactulose, milk of magnesia) more than once a month  Advice Given:  N/A  Patient Will Follow Care Advice:  YES  Appointment Scheduled:  08/10/2013 10:30:00 Appointment Scheduled Provider:  [name] [facility])

## 2013-08-10 ENCOUNTER — Encounter: Payer: Self-pay | Admitting: Family Medicine

## 2013-08-10 ENCOUNTER — Ambulatory Visit (INDEPENDENT_AMBULATORY_CARE_PROVIDER_SITE_OTHER): Payer: Medicare Other | Admitting: Family Medicine

## 2013-08-10 VITALS — BP 142/84 | HR 90 | Temp 98.1°F

## 2013-08-10 DIAGNOSIS — B359 Dermatophytosis, unspecified: Secondary | ICD-10-CM

## 2013-08-10 DIAGNOSIS — K5900 Constipation, unspecified: Secondary | ICD-10-CM

## 2013-08-10 DIAGNOSIS — K439 Ventral hernia without obstruction or gangrene: Secondary | ICD-10-CM

## 2013-08-10 MED ORDER — KETOCONAZOLE 2 % EX CREA: 1.0000 "application " | TOPICAL_CREAM | Freq: Two times a day (BID) | CUTANEOUS | Status: DC

## 2013-08-10 NOTE — Progress Notes (Signed)
Pre visit review using our clinic review tool, if applicable. No additional management support is needed unless otherwise documented below in the visit note. 

## 2013-08-10 NOTE — Progress Notes (Signed)
   Subjective:    Patient ID: [name], female    DOB: [birth year], 58 y.o.   MRN: [id number]  HPI Here for several things. First she asks me to check a swelling on her abdomen that appeared about 2 weeks ago. There is no pain or discomfort. She thinks she gets constipated at times because the stool in her ostomy bag gets hard. She still takes Colace bid. She saw Dr. Trudie Reed' office 4 weeks ago and started on Methotrexate injections once a week. She had labs last week and is awaiting results. Also her daughter noticed a red ring on her left buttock last week. It does not itch.    Review of Systems  Constitutional: Negative.   Gastrointestinal: Positive for constipation. Negative for nausea, vomiting, abdominal pain, diarrhea, blood in stool, abdominal distention and rectal pain.  Skin: Positive for rash.       Objective:   Physical Exam  Constitutional: She appears well-developed and well-nourished.  Abdominal: Soft. Bowel sounds are normal. She exhibits no distension. There is no tenderness. There is no rebound and no guarding.  Soft, reducible, non-tender ventral hernia in the LLQ   Skin:  The left buttock has a raised red annular lesion about 1.5 cm in diameter           Assessment & Plan:  Treat the ringworm with ketoconazole cream. Try MOM using one tbsp bid. As long as the ventral hernia is not causing pain, we will observe for now.

## 2013-08-13 ENCOUNTER — Telehealth: Payer: Self-pay | Admitting: Family Medicine

## 2013-08-13 NOTE — Telephone Encounter (Signed)
Pt needs bone density test results

## 2013-08-14 ENCOUNTER — Encounter: Payer: Self-pay | Admitting: Family Medicine

## 2013-08-14 NOTE — Telephone Encounter (Signed)
Per Dr. Sarajane Jews, pt's osteoporosis is a little worse, stay on Actonel, add vitamin d 2000 units, calcium 1500 mg every day. Repeat bone density scan in 2 years. I spoke with pt and went over this information, mammogram was normal.

## 2013-08-14 NOTE — Telephone Encounter (Signed)
The bone density test and mammograms results in md folder

## 2013-08-14 NOTE — Telephone Encounter (Signed)
No results are back yet

## 2013-08-14 NOTE — Telephone Encounter (Signed)
I called [facility] and requesting results, they are going to fax over a copy. [phone])

## 2013-08-18 ENCOUNTER — Ambulatory Visit: Payer: Medicare Other | Admitting: Podiatry

## 2013-08-24 ENCOUNTER — Ambulatory Visit: Payer: Medicare Other | Admitting: Podiatry

## 2013-08-24 ENCOUNTER — Other Ambulatory Visit: Payer: Self-pay | Admitting: Family Medicine

## 2013-08-25 ENCOUNTER — Encounter: Payer: Self-pay | Admitting: Family Medicine

## 2013-08-31 ENCOUNTER — Encounter: Payer: Self-pay | Admitting: Podiatry

## 2013-08-31 ENCOUNTER — Ambulatory Visit (INDEPENDENT_AMBULATORY_CARE_PROVIDER_SITE_OTHER): Payer: Medicare Other | Admitting: Podiatry

## 2013-08-31 VITALS — BP 141/100 | HR 98

## 2013-08-31 DIAGNOSIS — M79606 Pain in leg, unspecified: Secondary | ICD-10-CM

## 2013-08-31 DIAGNOSIS — M79609 Pain in unspecified limb: Secondary | ICD-10-CM

## 2013-08-31 DIAGNOSIS — E119 Type 2 diabetes mellitus without complications: Secondary | ICD-10-CM

## 2013-08-31 DIAGNOSIS — B351 Tinea unguium: Secondary | ICD-10-CM

## 2013-08-31 DIAGNOSIS — M14679 Charcot's joint, unspecified ankle and foot: Secondary | ICD-10-CM

## 2013-08-31 DIAGNOSIS — M79673 Pain in unspecified foot: Secondary | ICD-10-CM

## 2013-08-31 DIAGNOSIS — A5211 Tabes dorsalis: Secondary | ICD-10-CM

## 2013-08-31 NOTE — Progress Notes (Signed)
Assessment:  58 year old blind female presents with her daughter on a wheel chair requesting toe nails trimmed. Her blood sugar has been up to 140 today.  Objective: Pedal dorsalis pedis arteries are palpable on left but not palpable on right. Both feet are cool touch.   Plantar callused lesion is dry with thick build up callus under center portion of plantar surface left foot.  This callus is under the area of collapsed Calcaneocuboid joint area.  Both feet are flat with collapsed arch, sever lateral abduction of the midfoot on rearfoot.   Assessment: No open wound with recurring callus at collapsed cuboid bone with plantar callus.  Deforming Rheumatoid.   Plan: The lesion was debrided.  Return in 3 months.

## 2013-08-31 NOTE — Patient Instructions (Signed)
Seen for hypertrophic nails. All nails debrided. Return in 3 months or as needed.  

## 2013-09-01 ENCOUNTER — Ambulatory Visit: Payer: Medicare Other | Admitting: Podiatry

## 2013-09-08 ENCOUNTER — Telehealth: Payer: Self-pay | Admitting: Family Medicine

## 2013-09-08 NOTE — Telephone Encounter (Signed)
done

## 2013-09-08 NOTE — Telephone Encounter (Signed)
Pt would like a letter excusing her from jury duty, pt  Is supposed to serve 4/28. Due to pt's status, pt is unable to get there. Juror no [id number]. [address] [place], [place] [phone]

## 2013-09-09 NOTE — Telephone Encounter (Signed)
Note is ready for pick up and I left a voice message for pt.

## 2013-09-11 ENCOUNTER — Other Ambulatory Visit: Payer: Self-pay | Admitting: Family Medicine

## 2013-09-11 ENCOUNTER — Emergency Department (HOSPITAL_COMMUNITY): Payer: Medicare Other

## 2013-09-11 ENCOUNTER — Emergency Department (HOSPITAL_COMMUNITY)
Admission: EM | Admit: 2013-09-11 | Discharge: 2013-09-11 | Disposition: A | Discharge status: Home / Self Care | Payer: Medicare Other | Attending: Emergency Medicine | Admitting: Emergency Medicine

## 2013-09-11 ENCOUNTER — Encounter (HOSPITAL_COMMUNITY): Payer: Self-pay | Admitting: Emergency Medicine

## 2013-09-11 DIAGNOSIS — M24576 Contracture, unspecified foot: Secondary | ICD-10-CM

## 2013-09-11 DIAGNOSIS — Z8742 Personal history of other diseases of the female genital tract: Secondary | ICD-10-CM | POA: Insufficient documentation

## 2013-09-11 DIAGNOSIS — IMO0002 Reserved for concepts with insufficient information to code with codable children: Secondary | ICD-10-CM | POA: Insufficient documentation

## 2013-09-11 DIAGNOSIS — Z87440 Personal history of urinary (tract) infections: Secondary | ICD-10-CM | POA: Insufficient documentation

## 2013-09-11 DIAGNOSIS — M1A9XX0 Chronic gout, unspecified, without tophus (tophi): Secondary | ICD-10-CM | POA: Insufficient documentation

## 2013-09-11 DIAGNOSIS — Z8776 Personal history of (corrected) congenital malformations of integument, limbs and musculoskeletal system: Secondary | ICD-10-CM | POA: Insufficient documentation

## 2013-09-11 DIAGNOSIS — Z8669 Personal history of other diseases of the nervous system and sense organs: Secondary | ICD-10-CM | POA: Insufficient documentation

## 2013-09-11 DIAGNOSIS — Z8673 Personal history of transient ischemic attack (TIA), and cerebral infarction without residual deficits: Secondary | ICD-10-CM | POA: Insufficient documentation

## 2013-09-11 DIAGNOSIS — Z96659 Presence of unspecified artificial knee joint: Secondary | ICD-10-CM | POA: Insufficient documentation

## 2013-09-11 DIAGNOSIS — S79919A Unspecified injury of unspecified hip, initial encounter: Secondary | ICD-10-CM | POA: Insufficient documentation

## 2013-09-11 DIAGNOSIS — I10 Essential (primary) hypertension: Secondary | ICD-10-CM | POA: Insufficient documentation

## 2013-09-11 DIAGNOSIS — M810 Age-related osteoporosis without current pathological fracture: Secondary | ICD-10-CM | POA: Insufficient documentation

## 2013-09-11 DIAGNOSIS — S79929A Unspecified injury of unspecified thigh, initial encounter: Principal | ICD-10-CM

## 2013-09-11 DIAGNOSIS — R350 Frequency of micturition: Secondary | ICD-10-CM | POA: Insufficient documentation

## 2013-09-11 DIAGNOSIS — R300 Dysuria: Secondary | ICD-10-CM | POA: Insufficient documentation

## 2013-09-11 DIAGNOSIS — R296 Repeated falls: Secondary | ICD-10-CM | POA: Insufficient documentation

## 2013-09-11 DIAGNOSIS — Z87891 Personal history of nicotine dependence: Secondary | ICD-10-CM | POA: Insufficient documentation

## 2013-09-11 DIAGNOSIS — M069 Rheumatoid arthritis, unspecified: Secondary | ICD-10-CM

## 2013-09-11 DIAGNOSIS — Y9389 Activity, other specified: Secondary | ICD-10-CM | POA: Insufficient documentation

## 2013-09-11 DIAGNOSIS — M24573 Contracture, unspecified ankle: Secondary | ICD-10-CM | POA: Insufficient documentation

## 2013-09-11 DIAGNOSIS — Y929 Unspecified place or not applicable: Secondary | ICD-10-CM | POA: Insufficient documentation

## 2013-09-11 DIAGNOSIS — M24549 Contracture, unspecified hand: Secondary | ICD-10-CM | POA: Insufficient documentation

## 2013-09-11 DIAGNOSIS — Z8719 Personal history of other diseases of the digestive system: Secondary | ICD-10-CM | POA: Insufficient documentation

## 2013-09-11 DIAGNOSIS — M25552 Pain in left hip: Secondary | ICD-10-CM

## 2013-09-11 DIAGNOSIS — Z87768 Personal history of other specified (corrected) congenital malformations of integument, limbs and musculoskeletal system: Secondary | ICD-10-CM | POA: Insufficient documentation

## 2013-09-11 DIAGNOSIS — M1A00X0 Idiopathic chronic gout, unspecified site, without tophus (tophi): Secondary | ICD-10-CM | POA: Insufficient documentation

## 2013-09-11 DIAGNOSIS — Z79899 Other long term (current) drug therapy: Secondary | ICD-10-CM | POA: Insufficient documentation

## 2013-09-11 LAB — URINALYSIS, ROUTINE W REFLEX MICROSCOPIC
Bilirubin Urine: NEGATIVE
Glucose, UA: NEGATIVE mg/dL
Hgb urine dipstick: NEGATIVE
Ketones, ur: NEGATIVE mg/dL
Leukocytes, UA: NEGATIVE
Nitrite: NEGATIVE
Protein, ur: NEGATIVE mg/dL
Specific Gravity, Urine: 1.007 (ref 1.005–1.030)
Urobilinogen, UA: 0.2 mg/dL (ref 0.0–1.0)
pH: 6.5 (ref 5.0–8.0)

## 2013-09-11 MED ORDER — OXYCODONE-ACETAMINOPHEN 5-325 MG PO TABS: 1.0000 | ORAL_TABLET | Freq: Four times a day (QID) | ORAL | Status: DC | PRN

## 2013-09-11 MED ORDER — IBUPROFEN 200 MG PO TABS
600.0000 mg | ORAL_TABLET | Freq: Once | ORAL | Status: DC
  Filled 2013-09-11: qty 3

## 2013-09-11 MED ORDER — OXYCODONE-ACETAMINOPHEN 5-325 MG PO TABS
2.0000 | ORAL_TABLET | Freq: Once | ORAL | Status: AC
  Administered 2013-09-11: 2 via ORAL
  Filled 2013-09-11: qty 2

## 2013-09-11 NOTE — ED Notes (Signed)
Bed: TR71 Expected date:  Expected time:  Means of arrival:  Comments: EMS/57 yo female with left hip pain/fell 1 month ago

## 2013-09-11 NOTE — Discharge Instructions (Signed)
Take percocet only as needed for severe pain.  If taking, do not take vicodin. Be sure to follow up with your rheumatologist as scheduled for April, sooner if symptoms not improving.

## 2013-09-11 NOTE — ED Provider Notes (Signed)
Medical screening examination/treatment/procedure(s) were performed by non-physician practitioner and as supervising physician I was immediately available for consultation/collaboration.    Johnna Acosta, MD 09/11/13 [phone]

## 2013-09-11 NOTE — ED Notes (Signed)
Pt complains of left hip pain since fall 1 month ago.  This am pain was worse and can hardly walk on it. Pt is from home. Hx rheumatoid arthritis.

## 2013-09-11 NOTE — ED Provider Notes (Signed)
CSN: [id number]     Arrival date & time 09/11/13  0048 History   First MD Initiated Contact with Patient 09/11/13 0126     Chief Complaint  Patient presents with  . Hip Pain     (Consider location/radiation/quality/duration/timing/severity/associated sxs/prior Treatment) HPI Pt is a 58yo female wit hhx of Marfan's syndrome, osteoporosis, HTN, total knee replacement, chronic gout, and Rheumatoid arthritis, for which she sees Dr. Gavin Pound. Tonight, pt was brought in by EMS c/o left hip pain since fall 1 month ago.  Pt states pain was worse this morning, pt states she can hardly walk on it.  Pt denies any new falls or injuries. Pt states she has been getting cortisone injections in hip but last one was 1 month ago. Pt scheduled for f/u appointment with rheumatologist 10/04/12. Denies fever, n/v/d. Denies numbness or tingling in left leg. Denies back pain. Pt states she always has a walker or cane but EMS did not bring with her.    Pt also mentions she has had increased urination and burning with urination pt states she has had UTIs in past. Denies fever, n/v/d. Denies abdominal pain. Denies blood in urine. Denies vaginal symptoms.   Past Medical History  Diagnosis Date  . Legally blind     since pt was a teenager  . Hearing aid worn     pt wears bilateral hearing aids  . Marfan's syndrome affecting skin     with scolosis  . Osteoporosis   . Hypertension   . Substance abuse     recovering alcoholic W58 years   . Hernia 4/08  . 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 Chronic gout 2008  . Stroke 2/08  . Fibroid   . Status post bunionectomy 1/10    bilateral   . Diverticulitis of large intestine with perforation   . Small bowel obstruction due to adhesions   . Rheumatoid arthritis     sees Dr. Gavin Pound    Past Surgical History  Procedure Laterality Date  . Bunionectomy Bilateral 1/10  . Total knee arthroplasty Bilateral 6/08  . Hernia repair    . Laparotomy  N/A 11/27/2012    Procedure: EXPLORATORY LAPAROTOMY SIGMOID COLECTOMY, COLOSTOMY;  Surgeon: Madilyn Hook, DO;  Location: [facility];  Service: General;  Laterality: N/A;  . Colostomy    . Laparoscopic abdominal exploration N/A 01/06/2013    Procedure: LAPAROSCOPIC ABDOMINAL EXPLORATION;  Surgeon: Adin Hector, MD;  Location: [facility];  Service: General;  Laterality: N/A;  . Laparoscopic lysis of adhesions N/A 01/06/2013    Procedure: LAPAROSCOPIC LYSIS OF ADHESIONS/ INTEROTOMY REPAIR;  Surgeon: Adin Hector, MD;  Location: [facility];  Service: General;  Laterality: N/A;  . Colon surgery      colectomy   No family history on file. History  Substance Use Topics  . Smoking status: Former Smoker -- 1.00 packs/day    Types: Cigarettes    Quit date: 11/27/2012  . Smokeless tobacco: Never Used  . Alcohol Use: No     Comment: recovering alcoholic   OB History   Grav Para Term Preterm Abortions TAB SAB Ect Mult Living   1 1 1       1      Review of Systems  Constitutional: Negative for fever, chills and fatigue.  Respiratory: Negative for shortness of breath.   Cardiovascular: Negative for chest pain.  Genitourinary: Positive for dysuria and frequency. Negative for urgency, hematuria, flank pain, decreased  urine volume, vaginal bleeding, vaginal discharge, vaginal pain and pelvic pain.  Musculoskeletal: Positive for arthralgias ( left hip and buttock) and myalgias. Negative for neck pain and neck stiffness.  All other systems reviewed and are negative.      Allergies  Review of patient's allergies indicates no known allergies.  Home Medications   Current Outpatient Rx  Name  Route  Sig  Dispense  Refill  . albuterol (PROVENTIL HFA;VENTOLIN HFA) 108 (90 BASE) MCG/ACT inhaler   Inhalation   Inhale 1 puff into the lungs every 6 (six) hours as needed for wheezing or shortness of breath.         . allopurinol (ZYLOPRIM) 100 MG tablet      TAKE ONE TABLET BY MOUTH ONCE DAILY   30  tablet   11   . amLODipine (NORVASC) 10 MG tablet      take 1 tablet by mouth once daily   30 tablet   3   . Calcium Carbonate-Vitamin D (CALCIUM + D PO)   Oral   Take 1,200 mg by mouth daily.         . cetirizine (ZYRTEC) 10 MG tablet      take 1 tablet by mouth once daily   30 tablet   6   . Cyanocobalamin (VITAMIN B 12 PO)   Oral   Take 1 tablet by mouth daily.         Marland Kitchen docusate sodium (COLACE) 100 MG capsule   Oral   Take 100 mg by mouth 2 (two) times daily.           Marland Kitchen esomeprazole (NEXIUM) 40 MG capsule   Oral   Take 1 capsule (40 mg total) by mouth 2 (two) times daily.   60 capsule   11     PLEASE RESPOND   . folic acid (FOLVITE) 1 MG tablet   Oral   Take 1 mg by mouth daily.         . furosemide (LASIX) 20 MG tablet   Oral   Take 1 tablet (20 mg total) by mouth daily.   90 tablet   3   . ketoconazole (NIZORAL) 2 % cream   Topical   Apply 1 application topically 2 (two) times daily.   30 g   5   . megestrol (MEGACE ES) 625 MG/5ML suspension   Oral   Take 5 mL (625 mg total) by mouth 2 (two) times daily before a meal.   150 mL   5   . methocarbamol (ROBAXIN) 500 MG tablet   Oral   Take 500 mg by mouth 4 (four) times daily.         . Multiple Vitamins-Minerals (MULTIVITAMIN PO)   Oral   Take by mouth daily.         . risedronate (ACTONEL) 150 MG tablet      take 1 tablet by mouth every 30 DAYS. DO NOT EAT OR LIE DOWN FOR 30 MINUTES AFTER TAKING MEDICATION   1 tablet   11   . traMADol (ULTRAM) 50 MG tablet   Oral   Take 50 mg by mouth every 6 (six) hours as needed for moderate pain.         . vitamin C (ASCORBIC ACID) 500 MG tablet   Oral   Take 500 mg by mouth daily.         Marland Kitchen oxyCODONE-acetaminophen (PERCOCET/ROXICET) 5-325 MG per tablet   Oral   Take 1-2 tablets  by mouth every 6 (six) hours as needed for severe pain.   6 tablet   0    BP 131/87  Pulse 88  Temp(Src) 98.7 F (37.1 C) (Oral)  Resp 18  SpO2  97%  LMP 06/25/2006 Physical Exam  Nursing note and vitals reviewed. Constitutional: She appears well-developed and well-nourished. No distress.  HENT:  Head: Normocephalic and atraumatic.  Eyes: Conjunctivae are normal. No scleral icterus.  Neck: Normal range of motion.  Cardiovascular: Normal rate, regular rhythm and normal heart sounds.   Pulmonary/Chest: Effort normal and breath sounds normal. No respiratory distress. She has no wheezes. She has no rales. She exhibits no tenderness.  Abdominal: Soft. Bowel sounds are normal. She exhibits no distension and no mass. There is no tenderness. There is no rebound and no guarding.  Musculoskeletal: She exhibits tenderness. She exhibits no edema.  Contractures of both hands and feet. Chronic arthritic change. Tenderness in left hip and buttock near sciatic notch. No midline spinal tenderness. No crepitus of hip. FROM left hip.  Neurological: She is alert.  Skin: Skin is warm and dry. She is not diaphoretic.  Skin in tact. No ecchymosis, erythema, or warmth. No red streaking, induration, or evidence of underlying infection.    ED Course  Procedures (including critical care time) Labs Review Labs Reviewed  URINALYSIS, ROUTINE W REFLEX MICROSCOPIC   Imaging Review Dg Lumbar Spine Complete  09/11/2013   CLINICAL DATA:  Fall several months ago with continued back pain.  EXAM: LUMBAR SPINE - COMPLETE 4+ VIEW  COMPARISON:  CT abdomen and pelvis 02/25/2013.  FINDINGS: There is severe convex left scoliosis, unchanged. No fracture is identified. The patient has advanced multilevel degenerative disease. Paraspinous structures demonstrate atherosclerosis.  IMPRESSION: No acute finding.  Severe convex left scoliosis and multilevel degenerative change. Stable compared to prior exam.   Electronically Signed   By: Inge Rise M.D.   On: 09/11/2013 01:49   Dg Hip Complete Left  09/11/2013   CLINICAL DATA:  Fall several months ago with continued left  hip pain.  EXAM: LEFT HIP - COMPLETE 2+ VIEW  COMPARISON:  CT abdomen and pelvis 02/25/2013.  FINDINGS: No acute bony or joint abnormality is identified. The patient has advanced for age bilateral hip osteoarthritis. Convex left scoliosis and lumbar spondylosis are noted. Ostomy is identified.  IMPRESSION: No acute finding.  Marked bilateral hip degenerative change.   Electronically Signed   By: Inge Rise M.D.   On: 09/11/2013 01:50     EKG Interpretation None      MDM   Final diagnoses:  Left hip pain  Rheumatoid arthritis  Urinary frequency    Pt with hx of RA c/o worsening left hip pain, worse with ambulation. Normally uses walker or cane to ambulate. Denies recent falls or trauma.  Pt seen by rheumatologist, had cortisone injection 48mo ago. Pt is tender over left buttock. No crepitus in hip, FROM. Doubt fracture. Skin-no erythema, warmth, or induration. No evidence of underlying infection.  Plain films: no acute finding. Marked bilateral hip degenerative change.  Consistent with pt's hx of RA. Not concerned for avascular necrosis at this time.   Pt also c/o urinary frequency and dysuria.  UA: WNL  Will tx symptomatically.  Rx: percocet (6 tabs) advised to f/u with Rheumatologist as scheduled. Return precautions provided. Pt verbalized understanding and agreement with tx plan.        Noland Fordyce, PA-C 09/11/13 [phone]

## 2013-09-14 NOTE — Telephone Encounter (Signed)
Pt last seen Fry 3.9.15.  Pt seen in ED for hip pain on 3.20.15.  Pls advise.

## 2013-09-14 NOTE — Telephone Encounter (Signed)
Pt needs refill on tramadol

## 2013-09-16 ENCOUNTER — Ambulatory Visit (INDEPENDENT_AMBULATORY_CARE_PROVIDER_SITE_OTHER): Payer: Medicare Other | Admitting: General Surgery

## 2013-09-16 ENCOUNTER — Encounter (INDEPENDENT_AMBULATORY_CARE_PROVIDER_SITE_OTHER): Payer: Self-pay | Admitting: General Surgery

## 2013-09-16 ENCOUNTER — Telehealth (INDEPENDENT_AMBULATORY_CARE_PROVIDER_SITE_OTHER): Payer: Self-pay

## 2013-09-16 VITALS — BP 126/70 | HR 72 | Temp 98.3°F | Ht 68.0 in | Wt 142.0 lb

## 2013-09-16 DIAGNOSIS — K432 Incisional hernia without obstruction or gangrene: Secondary | ICD-10-CM

## 2013-09-16 NOTE — Patient Instructions (Signed)
Call the office if you develop severe constipation not relieved with medications or begin to have difficulty keeping your ostomy pouch in place.

## 2013-09-16 NOTE — Progress Notes (Signed)
Chief Complaint  Patient presents with  . knot under stoma    HISTORY:  [name] is a 58 y.o. female who presents to office with a "knot" around her ostomy. This has been present for a couple months.  She had surgery almost 2 years ago for some ischemic issues of her colon.  She denies any constipation or pouching difficulty.  She denies any abd pain.  She mainly c/o hip pain.    Past Medical History  Diagnosis Date  . Legally blind     since pt was a teenager  . Hearing aid worn     pt wears bilateral hearing aids  . Marfan's syndrome affecting skin     with scolosis  . Osteoporosis   . Hypertension   . Substance abuse     recovering alcoholic G38 years   . Hernia 4/08  . 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 Chronic gout 2008  . Stroke 2/08  . Fibroid   . Status post bunionectomy 1/10    bilateral   . Diverticulitis of large intestine with perforation   . Small bowel obstruction due to adhesions   . Rheumatoid arthritis     sees Dr. Gavin Pound        Past Surgical History  Procedure Laterality Date  . Bunionectomy Bilateral 1/10  . Total knee arthroplasty Bilateral 6/08  . Hernia repair    . Laparotomy N/A 11/27/2012    Procedure: EXPLORATORY LAPAROTOMY SIGMOID COLECTOMY, COLOSTOMY;  Surgeon: Madilyn Hook, DO;  Location: [facility];  Service: General;  Laterality: N/A;  . Colostomy    . Laparoscopic abdominal exploration N/A 01/06/2013    Procedure: LAPAROSCOPIC ABDOMINAL EXPLORATION;  Surgeon: Adin Hector, MD;  Location: [facility];  Service: General;  Laterality: N/A;  . Laparoscopic lysis of adhesions N/A 01/06/2013    Procedure: LAPAROSCOPIC LYSIS OF ADHESIONS/ INTEROTOMY REPAIR;  Surgeon: Adin Hector, MD;  Location: [facility];  Service: General;  Laterality: N/A;  . Colon surgery      colectomy      Current Outpatient Prescriptions  Medication Sig Dispense Refill  . albuterol (PROVENTIL HFA;VENTOLIN HFA) 108 (90 BASE) MCG/ACT inhaler Inhale 1  puff into the lungs every 6 (six) hours as needed for wheezing or shortness of breath.      . allopurinol (ZYLOPRIM) 100 MG tablet TAKE ONE TABLET BY MOUTH ONCE DAILY  30 tablet  11  . amLODipine (NORVASC) 10 MG tablet take 1 tablet by mouth once daily  30 tablet  3  . Calcium Carbonate-Vitamin D (CALCIUM + D PO) Take 1,200 mg by mouth daily.      . cetirizine (ZYRTEC) 10 MG tablet take 1 tablet by mouth once daily  30 tablet  6  . Cyanocobalamin (VITAMIN B 12 PO) Take 1 tablet by mouth daily.      Marland Kitchen docusate sodium (COLACE) 100 MG capsule Take 100 mg by mouth 2 (two) times daily.        Marland Kitchen esomeprazole (NEXIUM) 40 MG capsule Take 1 capsule (40 mg total) by mouth 2 (two) times daily.  60 capsule  11  . folic acid (FOLVITE) 1 MG tablet Take 1 mg by mouth daily.      . furosemide (LASIX) 20 MG tablet Take 1 tablet (20 mg total) by mouth daily.  90 tablet  3  . ketoconazole (NIZORAL) 2 % cream Apply 1 application topically 2 (two) times daily.  30 g  5  . megestrol (MEGACE ES) 625 MG/5ML suspension Take 5 mL (625 mg total) by mouth 2 (two) times daily before a meal.  150 mL  5  . methocarbamol (ROBAXIN) 500 MG tablet Take 500 mg by mouth 4 (four) times daily.      . Multiple Vitamins-Minerals (MULTIVITAMIN PO) Take by mouth daily.      Marland Kitchen oxyCODONE-acetaminophen (PERCOCET/ROXICET) 5-325 MG per tablet Take 1-2 tablets by mouth every 6 (six) hours as needed for severe pain.  6 tablet  0  . risedronate (ACTONEL) 150 MG tablet take 1 tablet by mouth every 30 DAYS. DO NOT EAT OR LIE DOWN FOR 30 MINUTES AFTER TAKING MEDICATION  1 tablet  11  . traMADol (ULTRAM) 50 MG tablet Take 50 mg by mouth every 6 (six) hours as needed for moderate pain.      . vitamin C (ASCORBIC ACID) 500 MG tablet Take 500 mg by mouth daily.       No current facility-administered medications for this visit.     No Known Allergies    No family history on file.    History   Social History  . Marital Status: Single    Spouse  Name: N/A    Number of Children: N/A  . Years of Education: N/A   Social History Main Topics  . Smoking status: Former Smoker -- 1.00 packs/day    Types: Cigarettes    Quit date: 11/27/2012  . Smokeless tobacco: Never Used  . Alcohol Use: No     Comment: recovering alcoholic  . Drug Use: No  . Sexual Activity: Yes    Partners: Male    Birth Control/ Protection: None     Comment: occasionally sexually active    Other Topics Concern  . None   Social History Narrative  . None       REVIEW OF SYSTEMS - PERTINENT POSITIVES ONLY: Review of Systems - General ROS: negative for - chills or fever Respiratory ROS: no cough, shortness of breath, or wheezing Cardiovascular ROS: no chest pain or dyspnea on exertion Gastrointestinal ROS: no abdominal pain, change in bowel habits, or black or bloody stools  EXAM: Filed Vitals:   09/16/13 1353  BP: 126/70  Pulse: 72  Temp: 98.3 F (36.8 C)    General appearance: alert and cooperative Resp: clear to auscultation bilaterally Cardio: regular rate and rhythm GI: normal findings: soft, non-tender Large incisional hernia in lower midline   ASSESSMENT AND PLAN: [name] is a very pleasant 58 year old female with Marfan syndrome and arthritis who is status post colonic resection emergently in [place].  This was complicated by several other surgical complications. She has recovered from all of this now but has developed an incisional hernia. Given her medical condition and the fact that she is not really having any symptoms from this hernia, I have suggested that she not undergo any surgery at this time. She asked me about possible colostomy reversal. Given her multiple medical problems during her last attempt at surgical resection, I do not think that that would be something I would be willing to undertake. If she is interested in pursuing this further, she could be referred to a tertiary care facility for a second  opinion.    Rosario Adie, MD Colon and Rectal Surgery / [facility]      Visit Diagnoses: 1. Incisional hernia, without obstruction or gangrene     Primary Care Physician: [name], MD

## 2013-09-16 NOTE — Telephone Encounter (Signed)
LMOM> pt needs to bring an extra ostomy bag to her appt this afternoon.

## 2013-09-16 NOTE — Telephone Encounter (Signed)
Pt returned call and was instructed to bring extra ostomy supplies to her appt today.  She understands.

## 2013-09-18 ENCOUNTER — Telehealth: Payer: Self-pay | Admitting: Family Medicine

## 2013-09-18 NOTE — Telephone Encounter (Signed)
Dr. Sarajane Jews wrote to change script to Flexeril 10 mg take 1 po tid prn. Can you check to see if this is covered before I send in a new script to [facility]?

## 2013-09-21 ENCOUNTER — Ambulatory Visit (INDEPENDENT_AMBULATORY_CARE_PROVIDER_SITE_OTHER): Payer: Medicare Other | Admitting: Family Medicine

## 2013-09-21 ENCOUNTER — Encounter: Payer: Self-pay | Admitting: Family Medicine

## 2013-09-21 VITALS — BP 130/72

## 2013-09-21 DIAGNOSIS — M25552 Pain in left hip: Secondary | ICD-10-CM

## 2013-09-21 DIAGNOSIS — M25559 Pain in unspecified hip: Secondary | ICD-10-CM

## 2013-09-21 MED ORDER — CYCLOBENZAPRINE HCL 10 MG PO TABS: 10.0000 mg | ORAL_TABLET | Freq: Three times a day (TID) | ORAL | Status: DC | PRN

## 2013-09-21 NOTE — Telephone Encounter (Signed)
Call in #120 with 5 rf 

## 2013-09-21 NOTE — Telephone Encounter (Signed)
PA was approved for the flexeril 10 mg TID.  [id number] till 06/24/14

## 2013-09-21 NOTE — Progress Notes (Signed)
   Subjective:    Patient ID: [name], female    DOB: [birth year], 58 y.o.   MRN: [id number]  HPI Here to follow up an ED visit on 09-11-13 for left hip pain. She had fallen on this hip several weeks before that, but Xrays revealed no fractures. She has arthritis in both hips. She describes a burning pain over the upper buttock area. This has actually improved over the past few days.    Review of Systems  Constitutional: Negative.   Musculoskeletal: Positive for arthralgias.       Objective:   Physical Exam  Constitutional: She appears well-developed and well-nourished.  Musculoskeletal:  She is tender over the left sciatic notch           Assessment & Plan:  This represents sciatic pain, and I suspect she bruised this area when she fell. It seems to be improving and it should resolve over the next few weeks. She will return prn

## 2013-09-21 NOTE — Progress Notes (Signed)
Pre visit review using our clinic review tool, if applicable. No additional management support is needed unless otherwise documented below in the visit note. 

## 2013-09-21 NOTE — Telephone Encounter (Signed)
I spoke with pt and sent script e-scribe to [facility], per pt request. ( also a 90 day supply )

## 2013-09-30 ENCOUNTER — Emergency Department (HOSPITAL_COMMUNITY)
Admission: EM | Admit: 2013-09-30 | Discharge: 2013-10-01 | Disposition: A | Discharge status: Home / Self Care | Payer: Medicare Other | Attending: Emergency Medicine | Admitting: Emergency Medicine

## 2013-09-30 ENCOUNTER — Encounter (HOSPITAL_COMMUNITY): Payer: Self-pay | Admitting: Emergency Medicine

## 2013-09-30 DIAGNOSIS — Z8719 Personal history of other diseases of the digestive system: Secondary | ICD-10-CM | POA: Insufficient documentation

## 2013-09-30 DIAGNOSIS — Z8673 Personal history of transient ischemic attack (TIA), and cerebral infarction without residual deficits: Secondary | ICD-10-CM | POA: Insufficient documentation

## 2013-09-30 DIAGNOSIS — R109 Unspecified abdominal pain: Secondary | ICD-10-CM

## 2013-09-30 DIAGNOSIS — I10 Essential (primary) hypertension: Secondary | ICD-10-CM | POA: Insufficient documentation

## 2013-09-30 DIAGNOSIS — Z96659 Presence of unspecified artificial knee joint: Secondary | ICD-10-CM | POA: Insufficient documentation

## 2013-09-30 DIAGNOSIS — M069 Rheumatoid arthritis, unspecified: Secondary | ICD-10-CM | POA: Insufficient documentation

## 2013-09-30 DIAGNOSIS — M1A9XX0 Chronic gout, unspecified, without tophus (tophi): Secondary | ICD-10-CM | POA: Insufficient documentation

## 2013-09-30 DIAGNOSIS — IMO0001 Reserved for inherently not codable concepts without codable children: Secondary | ICD-10-CM | POA: Insufficient documentation

## 2013-09-30 DIAGNOSIS — Z8742 Personal history of other diseases of the female genital tract: Secondary | ICD-10-CM | POA: Insufficient documentation

## 2013-09-30 DIAGNOSIS — Q8740 Marfan's syndrome, unspecified: Secondary | ICD-10-CM | POA: Insufficient documentation

## 2013-09-30 DIAGNOSIS — M7918 Myalgia, other site: Secondary | ICD-10-CM

## 2013-09-30 DIAGNOSIS — H548 Legal blindness, as defined in USA: Secondary | ICD-10-CM | POA: Insufficient documentation

## 2013-09-30 DIAGNOSIS — Z87891 Personal history of nicotine dependence: Secondary | ICD-10-CM | POA: Insufficient documentation

## 2013-09-30 DIAGNOSIS — M810 Age-related osteoporosis without current pathological fracture: Secondary | ICD-10-CM | POA: Insufficient documentation

## 2013-09-30 DIAGNOSIS — Z9889 Other specified postprocedural states: Secondary | ICD-10-CM | POA: Insufficient documentation

## 2013-09-30 DIAGNOSIS — M1A00X0 Idiopathic chronic gout, unspecified site, without tophus (tophi): Secondary | ICD-10-CM | POA: Insufficient documentation

## 2013-09-30 DIAGNOSIS — Z8611 Personal history of tuberculosis: Secondary | ICD-10-CM | POA: Insufficient documentation

## 2013-09-30 DIAGNOSIS — Z79899 Other long term (current) drug therapy: Secondary | ICD-10-CM | POA: Insufficient documentation

## 2013-09-30 DIAGNOSIS — Z8669 Personal history of other diseases of the nervous system and sense organs: Secondary | ICD-10-CM | POA: Insufficient documentation

## 2013-09-30 NOTE — ED Notes (Signed)
Pt c/o R flank pain x 2 week intermittent, constant today. +nausea.

## 2013-10-01 ENCOUNTER — Emergency Department (HOSPITAL_COMMUNITY): Payer: Medicare Other

## 2013-10-01 LAB — URINALYSIS, ROUTINE W REFLEX MICROSCOPIC
Bilirubin Urine: NEGATIVE
Glucose, UA: NEGATIVE mg/dL
Hgb urine dipstick: NEGATIVE
Ketones, ur: NEGATIVE mg/dL
Leukocytes, UA: NEGATIVE
Nitrite: NEGATIVE
Protein, ur: NEGATIVE mg/dL
Specific Gravity, Urine: 1.006 (ref 1.005–1.030)
Urobilinogen, UA: 0.2 mg/dL (ref 0.0–1.0)
pH: 6.5 (ref 5.0–8.0)

## 2013-10-01 LAB — CBC WITH DIFFERENTIAL/PLATELET
Basophils Absolute: 0 10*3/uL (ref 0.0–0.1)
Basophils Relative: 0 % (ref 0–1)
Eosinophils Absolute: 0.1 10*3/uL (ref 0.0–0.7)
Eosinophils Relative: 2 % (ref 0–5)
HCT: 34.6 % — ABNORMAL LOW (ref 36.0–46.0)
Hemoglobin: 11.3 g/dL — ABNORMAL LOW (ref 12.0–15.0)
Lymphocytes Relative: 19 % (ref 12–46)
Lymphs Abs: 1.3 10*3/uL (ref 0.7–4.0)
MCH: 30 pg (ref 26.0–34.0)
MCHC: 32.7 g/dL (ref 30.0–36.0)
MCV: 91.8 fL (ref 78.0–100.0)
Monocytes Absolute: 0.4 10*3/uL (ref 0.1–1.0)
Monocytes Relative: 6 % (ref 3–12)
Neutro Abs: 4.9 10*3/uL (ref 1.7–7.7)
Neutrophils Relative %: 73 % (ref 43–77)
Platelets: 293 10*3/uL (ref 150–400)
RBC: 3.77 MIL/uL — ABNORMAL LOW (ref 3.87–5.11)
RDW: 15.2 % (ref 11.5–15.5)
WBC: 6.7 10*3/uL (ref 4.0–10.5)

## 2013-10-01 LAB — BASIC METABOLIC PANEL
BUN: 11 mg/dL (ref 6–23)
CO2: 24 mEq/L (ref 19–32)
Calcium: 10 mg/dL (ref 8.4–10.5)
Chloride: 102 mEq/L (ref 96–112)
Creatinine, Ser: 0.62 mg/dL (ref 0.50–1.10)
GFR calc Af Amer: >90 mL/min (ref >90)
GFR calc non Af Amer: >90 mL/min (ref >90)
Glucose, Bld: 90 mg/dL (ref 70–99)
Potassium: 4 mEq/L (ref 3.7–5.3)
Sodium: 139 mEq/L (ref 137–147)

## 2013-10-01 MED ORDER — TRAMADOL HCL 50 MG PO TABS
50.0000 mg | ORAL_TABLET | Freq: Once | ORAL | Status: AC
  Administered 2013-10-01: 50 mg via ORAL
  Filled 2013-10-01: qty 1

## 2013-10-01 MED ORDER — ONDANSETRON 8 MG PO TBDP
8.0000 mg | ORAL_TABLET | Freq: Once | ORAL | Status: AC
  Administered 2013-10-01: 8 mg via ORAL
  Filled 2013-10-01: qty 1

## 2013-10-01 NOTE — ED Provider Notes (Signed)
CSN: [id number]     Arrival date & time 09/30/13  2122 History   First MD Initiated Contact with Patient 10/01/13 0008     Chief Complaint  Patient presents with  . Flank Pain     (Consider location/radiation/quality/duration/timing/severity/associated sxs/prior Treatment) HPI 58 year old female presents to emergency room from home with complaint of right flank pain.  Patient reports she has had pain intermittently over the last 2 weeks, but has been constant today.  She has had some nausea.  No vomiting.  She reports the pain is a crampy crampy type of pain.  It is worse with movement.  No radiation into the groin.  She denies any history of kidney stones or prior kidney infections.  No dysuria.  Patient has a colostomy with a history of diverticulitis, and perforation, and small bowel obstruction.  She reports normal output from her ostomy.  Patient also has history of Marfan's syndrome, chronic gout, blindness, rheumatoid arthritis.  No fevers or chills, no vomiting.  She reports normal appetite. Past Medical History  Diagnosis Date  . Legally blind     since pt was a teenager  . Hearing aid worn     pt wears bilateral hearing aids  . Marfan's syndrome affecting skin     with scolosis  . Osteoporosis   . Hypertension   . Substance abuse     recovering alcoholic O84 years   . Hernia 4/08  . 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 Chronic gout 2008  . Stroke 2/08  . Fibroid   . Status post bunionectomy 1/10    bilateral   . Diverticulitis of large intestine with perforation   . Small bowel obstruction due to adhesions   . Rheumatoid arthritis     sees Dr. Gavin Pound   . TB (pulmonary tuberculosis)    Past Surgical History  Procedure Laterality Date  . Bunionectomy Bilateral 1/10  . Total knee arthroplasty Bilateral 6/08  . Hernia repair    . Laparotomy N/A 11/27/2012    Procedure: EXPLORATORY LAPAROTOMY SIGMOID COLECTOMY, COLOSTOMY;  Surgeon: Madilyn Hook, DO;   Location: [facility];  Service: General;  Laterality: N/A;  . Colostomy    . Laparoscopic abdominal exploration N/A 01/06/2013    Procedure: LAPAROSCOPIC ABDOMINAL EXPLORATION;  Surgeon: Adin Hector, MD;  Location: [facility];  Service: General;  Laterality: N/A;  . Laparoscopic lysis of adhesions N/A 01/06/2013    Procedure: LAPAROSCOPIC LYSIS OF ADHESIONS/ INTEROTOMY REPAIR;  Surgeon: Adin Hector, MD;  Location: [facility];  Service: General;  Laterality: N/A;  . Colon surgery      colectomy   No family history on file. History  Substance Use Topics  . Smoking status: Former Smoker -- 1.00 packs/day    Types: Cigarettes    Quit date: 11/27/2012  . Smokeless tobacco: Never Used  . Alcohol Use: No     Comment: recovering alcoholic   OB History   Grav Para Term Preterm Abortions TAB SAB Ect Mult Living   1 1 1       1      Review of Systems   See History of Present Illness; otherwise all other systems are reviewed and negative  Allergies  Review of patient's allergies indicates no known allergies.  Home Medications   Current Outpatient Rx  Name  Route  Sig  Dispense  Refill  . albuterol (PROVENTIL HFA;VENTOLIN HFA) 108 (90 BASE) MCG/ACT inhaler   Inhalation  Inhale 1 puff into the lungs every 6 (six) hours as needed for wheezing or shortness of breath.         . allopurinol (ZYLOPRIM) 100 MG tablet      TAKE ONE TABLET BY MOUTH ONCE DAILY   30 tablet   11   . amLODipine (NORVASC) 10 MG tablet      take 1 tablet by mouth once daily   30 tablet   3   . Calcium Carbonate-Vitamin D (CALCIUM + D PO)   Oral   Take 1,200 mg by mouth daily.         . cetirizine (ZYRTEC) 10 MG tablet      take 1 tablet by mouth once daily   30 tablet   6   . Cyanocobalamin (VITAMIN B 12 PO)   Oral   Take 1 tablet by mouth daily.         . cyclobenzaprine (FLEXERIL) 10 MG tablet   Oral   Take 1 tablet (10 mg total) by mouth 3 (three) times daily as needed for muscle spasms.    270 tablet   3   . docusate sodium (COLACE) 100 MG capsule   Oral   Take 100 mg by mouth 2 (two) times daily.           Marland Kitchen esomeprazole (NEXIUM) 40 MG capsule   Oral   Take 1 capsule (40 mg total) by mouth 2 (two) times daily.   60 capsule   11     PLEASE RESPOND   . folic acid (FOLVITE) 1 MG tablet   Oral   Take 1 mg by mouth daily.         . furosemide (LASIX) 20 MG tablet   Oral   Take 1 tablet (20 mg total) by mouth daily.   90 tablet   3   . HYDROcodone-acetaminophen (NORCO/VICODIN) 5-325 MG per tablet   Oral   Take 1 tablet by mouth every 6 (six) hours as needed for moderate pain.         Marland Kitchen isoniazid (NYDRAZID) 300 MG tablet   Oral   Take 300 mg by mouth daily.         Marland Kitchen ketoconazole (NIZORAL) 2 % cream   Topical   Apply 1 application topically 2 (two) times daily.   30 g   5   . megestrol (MEGACE ES) 625 MG/5ML suspension   Oral   Take 5 mL (625 mg total) by mouth 2 (two) times daily before a meal.   150 mL   5   . methotrexate (50 MG/ML) 1 G injection   Intravenous   Inject 60 mg into the vein once a week. Monday         . Multiple Vitamins-Minerals (MULTIVITAMIN PO)   Oral   Take by mouth daily.         Marland Kitchen pyridOXINE (VITAMIN B-6) 50 MG tablet   Oral   Take 50 mg by mouth daily.         . traMADol (ULTRAM) 50 MG tablet   Oral   Take 50 mg by mouth every 6 (six) hours as needed for moderate pain.         . vitamin C (ASCORBIC ACID) 500 MG tablet   Oral   Take 500 mg by mouth daily.         . risedronate (ACTONEL) 150 MG tablet      take 1 tablet by mouth every 30  DAYS. DO NOT EAT OR LIE DOWN FOR 30 MINUTES AFTER TAKING MEDICATION   1 tablet   11    BP 128/88  Pulse 95  Temp(Src) 98 F (36.7 C) (Oral)  Resp 18  Ht 5\' 8"  (1.727 m)  Wt 140 lb (63.504 kg)  BMI 21.29 kg/m2  SpO2 100%  LMP 06/25/2006 Physical Exam  Nursing note and vitals reviewed. Constitutional: She is oriented to person, place, and time. She  appears well-developed and well-nourished. No distress.  HENT:  Head: Normocephalic and atraumatic.  Nose: Nose normal.  Mouth/Throat: Oropharynx is clear and moist.  Neck: Normal range of motion. Neck supple. No JVD present. No tracheal deviation present. No thyromegaly present.  Cardiovascular: Normal rate, regular rhythm, normal heart sounds and intact distal pulses.  Exam reveals no gallop and no friction rub.   No murmur heard. Pulmonary/Chest: Effort normal and breath sounds normal. No stridor. No respiratory distress. She has no wheezes. She has no rales. She exhibits no tenderness.  Abdominal: Soft. Bowel sounds are normal. She exhibits no distension and no mass. There is no tenderness. There is no rebound and no guarding.  Musculoskeletal: Normal range of motion. She exhibits tenderness (patient has tenderness to palpation over the right flank without overlying skin changes.  Palpation of the musculature reproduces the pain). She exhibits no edema.  Lymphadenopathy:    She has no cervical adenopathy.  Neurological: She is alert and oriented to person, place, and time. She exhibits normal muscle tone. Coordination normal.  Skin: Skin is warm and dry. No rash noted. No erythema. No pallor.  Psychiatric: She has a normal mood and affect. Her behavior is normal. Judgment and thought content normal.    ED Course  Procedures (including critical care time) Labs Review Labs Reviewed  URINALYSIS, ROUTINE W REFLEX MICROSCOPIC - Abnormal; Notable for the following:    APPearance CLOUDY (*)    All other components within normal limits  CBC WITH DIFFERENTIAL - Abnormal; Notable for the following:    RBC 3.77 (*)    Hemoglobin 11.3 (*)    HCT 34.6 (*)    All other components within normal limits  BASIC METABOLIC PANEL   Imaging Review Dg Abd 1 View  10/01/2013   CLINICAL DATA:  Right flank pain for 2 week  EXAM: ABDOMEN - 1 VIEW  COMPARISON:  09/11/2013  FINDINGS: Nonobstructive bowel gas  pattern. Descending colostomy noted in the left abdomen. No evidence of urolithiasis. Pelvic calcifications are venous, arterial, or cutaneous based on previous CT imaging. Severe lumbar dextroscoliosis with advanced degenerative disc and facet change. Clear lung bases.  IMPRESSION: Nonobstructive bowel gas pattern.  No evidence of nephrolithiasis.   Electronically Signed   By: Jorje Guild M.D.   On: 10/01/2013 01:32     EKG Interpretation None      MDM   Final diagnoses:  Right flank pain  Musculoskeletal pain    58 year old female with 2 weeks of right flank pain.  Workup here unremarkable snow signs of bowel obstruction.  Kidney stone, kidney infection.  His pain is reproducible with palpation of the muscles of the back, suspect muscle strain.  Patient reports that when she takes her Vicodin, her pain is controlled.  We'll refer her back to her primary care Dr.    Kalman Drape, MD 10/01/13 [phone]

## 2013-10-01 NOTE — ED Notes (Signed)
Patient states her daughter will be coming to pick her up.

## 2013-10-01 NOTE — ED Notes (Addendum)
Patient c/o R flank pain x2 weeks, intermittent. States today the pain has become severe. Patient resting quietly, eyes closed, NAD noted. Patient states her pain is worse when she is sitting. Patient reports slight burning with urination, and difficulty emptying her bladder. Patient states she has an appt scheduled with a urologist in May for this complaint.

## 2013-10-01 NOTE — ED Notes (Signed)
Patient placed on bedpan.

## 2013-10-01 NOTE — Discharge Instructions (Signed)
Take vicodin or ultram as prescribed for pain.  Use heating pad on the sore area.  Return to the ER for worsening condition or new concerning symptoms.  Follow up with your doctor for recheck in 2-3 days.   Flank Pain Flank pain refers to pain that is located on the side of the body between the upper abdomen and the back. The pain may occur over a short period of time (acute) or may be long-term or reoccurring (chronic). It may be mild or severe. Flank pain can be caused by many things. CAUSES  Some of the more common causes of flank pain include:  Muscle strains.   Muscle spasms.   A disease of your spine (vertebral disk disease).   A lung infection (pneumonia).   Fluid around your lungs (pulmonary edema).   A kidney infection.   Kidney stones.   A very painful skin rash caused by the chickenpox virus (shingles).   Gallbladder disease.  [place] care will depend on the cause of your pain. In general,  Rest as directed by your caregiver.  Drink enough fluids to keep your urine clear or pale yellow.  Only take over-the-counter or prescription medicines as directed by your caregiver. Some medicines may help relieve the pain.  Tell your caregiver about any changes in your pain.  Follow up with your caregiver as directed. SEEK IMMEDIATE MEDICAL CARE IF:   Your pain is not controlled with medicine.   You have new or worsening symptoms.  Your pain increases.   You have abdominal pain.   You have shortness of breath.   You have persistent nausea or vomiting.   You have swelling in your abdomen.   You feel faint or pass out.   You have blood in your urine.  You have a fever or persistent symptoms for more than 2 3 days.  You have a fever and your symptoms suddenly get worse. MAKE SURE YOU:   Understand these instructions.  Will watch your condition.  Will get help right away if you are not doing well or get worse. Document  Released: 08/02/2005 Document Revised: 03/05/2012 Document Reviewed: 01/24/2012 [facility] Patient Information 2014 [place].  Musculoskeletal Pain Musculoskeletal pain is muscle and boney aches and pains. These pains can occur in any part of the body. Your caregiver may treat you without knowing the cause of the pain. They may treat you if blood or urine tests, X-rays, and other tests were normal.  CAUSES There is often not a definite cause or reason for these pains. These pains may be caused by a type of germ (virus). The discomfort may also come from overuse. Overuse includes working out too hard when your body is not fit. Boney aches also come from weather changes. Bone is sensitive to atmospheric pressure changes. HOME CARE INSTRUCTIONS   Ask when your test results will be ready. Make sure you get your test results.  Only take over-the-counter or prescription medicines for pain, discomfort, or fever as directed by your caregiver. If you were given medications for your condition, do not drive, operate machinery or power tools, or sign legal documents for 24 hours. Do not drink alcohol. Do not take sleeping pills or other medications that may interfere with treatment.  Continue all activities unless the activities cause more pain. When the pain lessens, slowly resume normal activities. Gradually increase the intensity and duration of the activities or exercise.  During periods of severe pain, bed rest may  be helpful. Lay or sit in any position that is comfortable.  Putting ice on the injured area.  Put ice in a bag.  Place a towel between your skin and the bag.  Leave the ice on for 15 to 20 minutes, 3 to 4 times a day.  Follow up with your caregiver for continued problems and no reason can be found for the pain. If the pain becomes worse or does not go away, it may be necessary to repeat tests or do additional testing. Your caregiver may need to look further for a possible  cause. SEEK IMMEDIATE MEDICAL CARE IF:  You have pain that is getting worse and is not relieved by medications.  You develop chest pain that is associated with shortness or breath, sweating, feeling sick to your stomach (nauseous), or throw up (vomit).  Your pain becomes localized to the abdomen.  You develop any new symptoms that seem different or that concern you. MAKE SURE YOU:   Understand these instructions.  Will watch your condition.  Will get help right away if you are not doing well or get worse. Document Released: 06/11/2005 Document Revised: 09/03/2011 Document Reviewed: 02/13/2013 [facility] Patient Information 2014 [place].

## 2013-10-07 ENCOUNTER — Telehealth: Payer: Self-pay | Admitting: Family Medicine

## 2013-10-07 NOTE — Telephone Encounter (Signed)
Pt is needing a post fu from er, severe pain on right side, pt states she can only come on Monday, Ok to use two sda on Monday 10/12/13 at 11:00?

## 2013-10-08 NOTE — Telephone Encounter (Signed)
Okay per Dr. Fry.  

## 2013-10-12 ENCOUNTER — Ambulatory Visit (INDEPENDENT_AMBULATORY_CARE_PROVIDER_SITE_OTHER): Payer: Medicare Other | Admitting: Family Medicine

## 2013-10-12 ENCOUNTER — Encounter: Payer: Self-pay | Admitting: Family Medicine

## 2013-10-12 VITALS — BP 128/76 | HR 100 | Temp 98.8°F

## 2013-10-12 DIAGNOSIS — M545 Low back pain, unspecified: Secondary | ICD-10-CM

## 2013-10-12 DIAGNOSIS — M546 Pain in thoracic spine: Secondary | ICD-10-CM

## 2013-10-12 MED ORDER — HYDROCODONE-ACETAMINOPHEN 5-325 MG PO TABS: 1.0000 | ORAL_TABLET | Freq: Four times a day (QID) | ORAL | Status: DC | PRN

## 2013-10-12 NOTE — Progress Notes (Signed)
Pre visit review using our clinic review tool, if applicable. No additional management support is needed unless otherwise documented below in the visit note. 

## 2013-10-12 NOTE — Progress Notes (Signed)
   Subjective:    Patient ID: [name], female    DOB: 06-06-1956, 58 y.o.   MRN: [id number]  HPI Here for intermittent middle and lower back pains. She has rheumatoid arthritis, advanced degenerative disc disease, and severe scoliosis. She has severe sharp pains in either the left or right sides. She often wakes up with these pains. Vicodin helps to relieve them. She was in the ER on 09-30-13 for right flank pain, and her workup was negative including a CBC, a UA, and plain abdominal Xrays.    Review of Systems  Constitutional: Negative.   Gastrointestinal: Negative.   Genitourinary: Positive for flank pain. Negative for dysuria, urgency, frequency, hematuria and difficulty urinating.  Musculoskeletal: Positive for back pain.       Objective:   Physical Exam  Constitutional: She appears well-developed and well-nourished. No distress.  Abdominal: Soft. Bowel sounds are normal. She exhibits no distension and no mass. There is no tenderness. There is no rebound and no guarding.  Musculoskeletal:  Tender over the middle and lower back areas, especially in the right lower back. She has severe scoliosis           Assessment & Plan:  Refilled Vicodin. We will get MRI scans of the thoracic and lumbar spines to look for herniated discs, vertebral fractures, etc.

## 2013-10-13 NOTE — Telephone Encounter (Signed)
appt made for pt

## 2013-10-26 ENCOUNTER — Ambulatory Visit
Admission: RE | Admit: 2013-10-26 | Discharge: 2013-10-26 | Disposition: A | Discharge status: Home / Self Care | Payer: Medicare Other | Source: Ambulatory Visit | Attending: Family Medicine | Admitting: Family Medicine

## 2013-10-26 DIAGNOSIS — M545 Low back pain, unspecified: Secondary | ICD-10-CM

## 2013-10-26 DIAGNOSIS — M546 Pain in thoracic spine: Secondary | ICD-10-CM

## 2013-11-05 ENCOUNTER — Telehealth: Payer: Self-pay | Admitting: Family Medicine

## 2013-11-05 NOTE — Telephone Encounter (Signed)
Pt needs anti-fungal powder for her colostomy, she has a yeast infection around the area.

## 2013-11-06 ENCOUNTER — Telehealth: Payer: Self-pay | Admitting: Family Medicine

## 2013-11-06 MED ORDER — HYDROCODONE-ACETAMINOPHEN 10-325 MG PO TABS: 1.0000 | ORAL_TABLET | Freq: Four times a day (QID) | ORAL | Status: DC | PRN

## 2013-11-06 MED ORDER — KETOCONAZOLE POWD: Status: DC

## 2013-11-06 NOTE — Telephone Encounter (Signed)
I sent script e-scribe and spoke with pt. 

## 2013-11-06 NOTE — Telephone Encounter (Signed)
Done for 10/325. She need to bring in all the extra rx for the 5/325 that she has for [place] to shred

## 2013-11-06 NOTE — Telephone Encounter (Signed)
Call in Ketoconazole powder to apply tid prn fungal infections, 5000 gm bottle with 5 rf

## 2013-11-06 NOTE — Telephone Encounter (Signed)
I spoke with pt and she will turn in the other 2 scripts when she picks up these.

## 2013-11-06 NOTE — Telephone Encounter (Signed)
Pt states that when she went to fill her rx HYDROcodone-acetaminophen (NORCO/VICODIN) 5-325 MG per tablet, pt states that she has been getting 10 mg for about 4 months, however this rx was not written for the 10 mg. Pt needs to know what to do. States she only picked up one rx and she still has the other two for June and July.

## 2013-11-20 ENCOUNTER — Other Ambulatory Visit: Payer: Self-pay | Admitting: Family Medicine

## 2013-11-30 ENCOUNTER — Encounter (INDEPENDENT_AMBULATORY_CARE_PROVIDER_SITE_OTHER): Payer: Self-pay | Admitting: Surgery

## 2013-11-30 ENCOUNTER — Ambulatory Visit: Payer: Medicare Other | Admitting: Podiatry

## 2013-11-30 ENCOUNTER — Ambulatory Visit (INDEPENDENT_AMBULATORY_CARE_PROVIDER_SITE_OTHER): Payer: Medicare Other | Admitting: Surgery

## 2013-11-30 VITALS — BP 136/78 | HR 59 | Temp 97.8°F | Ht 68.0 in | Wt 142.0 lb

## 2013-11-30 DIAGNOSIS — K432 Incisional hernia without obstruction or gangrene: Secondary | ICD-10-CM

## 2013-11-30 DIAGNOSIS — Z7189 Other specified counseling: Secondary | ICD-10-CM | POA: Insufficient documentation

## 2013-11-30 DIAGNOSIS — K5720 Diverticulitis of large intestine with perforation and abscess without bleeding: Secondary | ICD-10-CM

## 2013-11-30 DIAGNOSIS — K5732 Diverticulitis of large intestine without perforation or abscess without bleeding: Secondary | ICD-10-CM

## 2013-11-30 DIAGNOSIS — L309 Dermatitis, unspecified: Secondary | ICD-10-CM

## 2013-11-30 DIAGNOSIS — K5909 Other constipation: Secondary | ICD-10-CM | POA: Insufficient documentation

## 2013-11-30 DIAGNOSIS — Q8740 Marfan's syndrome, unspecified: Secondary | ICD-10-CM

## 2013-11-30 DIAGNOSIS — K5900 Constipation, unspecified: Secondary | ICD-10-CM

## 2013-11-30 DIAGNOSIS — L259 Unspecified contact dermatitis, unspecified cause: Secondary | ICD-10-CM

## 2013-11-30 NOTE — Patient Instructions (Signed)
Please consider the recommendations that we have given you today:  Continue using powder and change ostomy appliance every 2-3 days to minimize irritation/dermatitis around the stoma.  No evidence of active yeast infection at this time  It having issues of moisture/leakage persisting, reconsider evaluation with outpatient ostomy nurse and [facility] to see a different pouch as needed.  Consider wearing an abdominal binder for incisional hernia  See the Handout(s) we have given you.  Please call our office at [phone] if you wish to schedule surgery or if you have further questions / concerns.   Ostomy Support Information  Yes, you've heard that people get along just fine with only one of their eyes, or one of their lungs, or one of their kidneys. But you also know that you have only one intestine and only one bladder, and that leaves you feeling awfully empty, both physically and emotionally: You think no other people go around without part of their intestine with the ends of their intestines sticking out through their abdominal walls.  Well, you are wrong! There are nearly three quarters of a million people in the [place] who have an ostomy; people who have had surgery to remove all or part of their colons or bladders. There is even a national association, the [place] [employer] [place] with over 350 local affiliated support groups that are organized by volunteers who provide peer support and counseling. [facility] has a toll free telephone num-ber, ([phone] and an educational,  interactive website, www.ostomy.org   An ostomy is an opening in the belly (abdominal wall) made by surgery. Ostomates are people who have had this procedure. The opening (stoma) allows the kidney or bowel to discharge waste. An external pouch covers the stoma to collect waste. Pouches are are a simple bag and are odor free. Different companies have disposable or reusable pouches to fit one's  lifestyle. An ostomy can either be temporary or permanent.  THERE ARE THREE MAIN TYPES OF OSTOMIES  Colostomy. A colostomy is a surgically created opening in the large intestine (colon).  Ileostomy. An ileostomy is a surgically created opening in the small intestine.  Urostomy. A urostomy is a surgically created opening to divert urine away from the bladder. FREQUENTLY ASKED QUESTIONS   Why haven't you met any of these folks who have an ostomy?  Well, maybe you have! You just did not recognize them because an ostomy doesn't show. It can be kept secret if you wish. Why, maybe some of your best friends, office associates or neighbors have an ostomy ... you never can tell.   People facing ostomy surgery have many quality-of-life questions like:  Will you bulge? Smell? Make noises? Will you feel waste leaving your body? Will you be a captive of the toilet? Will you starve? Be a social outcast? Get/stay married? Have babies? Easily bathe, go swimming, bend over?  OK, let's look at what you can expect:  Will you bulge?  Remember, without part of the intestine or bladder, and its contents, you should have a flatter tummy than before. You can expect to wear, with little exception, what you wore before surgery ... and this in-cludes tight clothing and bathing suits.  Will you smell?  Today, thanks to modern odor proof pouching systems, you can walk into an ostomy support group meeting and not smell anything that is foul or offensive. And, for those with an ileostomy or colostomy who are concerned about odor when emptying their pouch, there are  in-pouch deodorants that can be used to eliminate any waste odors that may exist.  Will you make noises?  Everyone produces gas, especially if they are an air-swallower. But intestinal sounds that occur from time to time are no differ-ent than a gurgling tummy, and quite often your clothing will muffle any sounds.   Will you feel the waste discharges?  For  those with a colostomy or ileostomy there might be a slight pressure when waste leaves your body, but understand that the intestines have no nerve endings, so there will be no unpleasant sensations. Those with a urostomy will probably be unaware of any kidney drainage.  Will you be a captive of the toilet?  Immediately post-op you will spend more time in the bathroom than you will after your body recovers from surgery. Every person is different, but on average those with an ileostomy or urostomy may empty their pouches 4 to 6 times a day; a little  less if you have a colostomy. The average wear time between pouch system changes is 3 to 5 days and the changing process should take less than 30 minutes.  Will I need to be on a special diet? Most people return to their normal diet when they have recovered from surgery. Be sure to chew your food well, eat a well-balanced diet and drink plenty of fluids. If you experience problems with a certain food, wait a couple of weeks and try it again. Will there be odor and noises? Pouching systems are designed to be odor-proof or odor-resistant. There are deodorants that can be used in the pouch. Medications are also available to help reduce odor. Limit gas-producing foods and carbonated beverages. You will experience less gas and fewer noises as you heal from surgery. How much time will it take to care for my ostomy? At first, you may spend a lot of time learning about your ostomy and how to take care of it. As you become more comfortable and skilled at changing the pouching system, it will take very little time to care for it.  Will I be able to return to work? People with ostomies can perform most jobs. As soon as you have healed from surgery, you should be able to return to work. Heavy lifting (more than 10 pounds) may be discouraged.  What about intimacy? Sexual relationships and intimacy are important and fulfilling aspects of your life. They should continue  after ostomy surgery. Intimacy-related concerns should be discussed openly between you and your partner.  Can I wear regular clothing? You do not need to wear special clothing. Ostomy pouches are fairly flat and barely noticeable. Elastic undergarments will not hurt the stoma or prevent the ostomy from functioning.  Can I participate in sports? An ostomy should not limit your involvement in sports. Many people with ostomies are runners, skiers, swimmers or participate in other active lifestyles. Talk with your caregiver first before doing heavy physical activity.  Will you starve?  Not if you follow doctor's orders at each stage of your post-op adjustment. There is no such thing as an "ostomy diet". Some people with an ostomy will be able to eat and tolerate anything; others may find diffi-culty with some foods. Each person is an individual and must determine, by trial, what is best for them. A good practice for all is to drink plenty of water.  Will you be a social outcast?  Have you met anyone who has an ostomy and is a social outcast? Why should  you be the first? Only your attitude and self image will effect how you are treated. No confi-dent person is an Occupational psychologist.   PROFESSIONAL HELP  Resources are available if you need help or have questions about your ostomy.    Specially trained nurses called Wound, Ostomy Continence Nurses (WOCN) are available for consultation in most major medical centers.   Consider getting an ostomy consult with Cena Benton at [facility] to help troubleshoot stoma pouch fittings and other issues with your ostomy: [phone]   The United Ostomy Association (UOA) is a group made up of many local chapters throughout the [place]. These local groups hold meetings and provide support to prospective and existing ostomates. They sponsor educational events and have qualified visitors to make personal or telephone visits. Contact the UOA for the chapter  nearest you and for other educational publications.  More detailed information can be found in Colostomy Guide, a publication of the [employer] (UOA). Contact UOA at 1-726 117 8718 or visit their web site at https://arellano.com/. The website contains links to other sites, suppliers and resources. Document Released: 06/14/2003 Document Revised: 09/03/2011 Document Reviewed: 10/13/2008 [facility] Patient Information 2013 [place].  Hernia A hernia occurs when an internal organ pushes out through a weak spot in the abdominal wall. Hernias most commonly occur in the groin and around the navel. Hernias often can be pushed back into place (reduced). Most hernias tend to get worse over time. Some abdominal hernias can get stuck in the opening (irreducible or incarcerated hernia) and cannot be reduced. An irreducible abdominal hernia which is tightly squeezed into the opening is at risk for impaired blood supply (strangulated hernia). A strangulated hernia is a medical emergency. Because of the risk for an irreducible or strangulated hernia, surgery may be recommended to repair a hernia. CAUSES   Heavy lifting.  Prolonged coughing.  Straining to have a bowel movement.  A cut (incision) made during an abdominal surgery. HOME CARE INSTRUCTIONS   Bed rest is not required. You may continue your normal activities.  Avoid lifting more than 10 pounds (4.5 kg) or straining.  Cough gently. If you are a smoker it is best to stop. Even the best hernia repair can break down with the continual strain of coughing. Even if you do not have your hernia repaired, a cough will continue to aggravate the problem.  Do not wear anything tight over your hernia. Do not try to keep it in with an outside bandage or truss. These can damage abdominal contents if they are trapped within the hernia sac.  Eat a normal diet.  Avoid constipation. Straining over long periods of time will increase hernia size and  encourage breakdown of repairs. If you cannot do this with diet alone, stool softeners may be used. SEEK IMMEDIATE MEDICAL CARE IF:   You have a fever.  You develop increasing abdominal pain.  You feel nauseous or vomit.  Your hernia is stuck outside the abdomen, looks discolored, feels hard, or is tender.  You have any changes in your bowel habits or in the hernia that are unusual for you.  You have increased pain or swelling around the hernia.  You cannot push the hernia back in place by applying gentle pressure while lying down. MAKE SURE YOU:   Understand these instructions.  Will watch your condition.  Will get help right away if you are not doing well or get worse. Document Released: 06/11/2005 Document Revised: 09/03/2011 Document Reviewed: 01/29/2008 [employer] Patient  Information 2014 [employer].  

## 2013-11-30 NOTE — Progress Notes (Signed)
Subjective:     Patient ID: [name], female   DOB: 09-06-1955, 58 y.o.   MRN: [id number]  HPI  Note: Portions of this report may have been transcribed using voice recognition software. Every effort was made to ensure accuracy; however, inadvertent computerized transcription errors may be present.   Any transcriptional errors that result from this process are unintentional.            [name]  1955-11-24 [id number]  Patient Care Team: [name], MD as PCP - General  This patient is a 58 y.o.female who presents today for surgical evaluation at the request of Dr. Sarajane [name].   Reason for visit: Rash around colostomy.  Question of yeast infection  Pleasant elderly woman.  History of Marfan syndrome.  Immunosuppressed on methotrexate.  Numerous other health issues.  Chronic pain on chronic narcotics.  Spell perforated diverticulitis that required laparotomy and Hartmann procedure June 2014.  Develop small bowel obstruction that required lysis of adhesions the following month.  Eventually recovered after prolonged hospital course.  Has a colostomy.  She comes in today with her child.  Concerned purplish discoloration and question of moisture around it.  No major leaks as best I can gather period was on antifungal creams and switch to powders after going to [facility] last month.  Area gradually getting better but not resolve.  Wished opinion to see if anything else needed to be done.  The patient is rather constipated and has hard bowel movements that sometimes push the ostomy wafer off.  Changing the appliance about every 2-3 days.   Has noticed bulging from her prior incision.  No pain.  No nausea or vomiting.  On chronic narcotics for chronic pain.  Patient Active Problem List   Diagnosis Date Noted  . Encounter for ostomy care education 11/30/2013  . Stoma dermatitis 11/30/2013  . Incisional hernia 11/30/2013  . Constipation, chronic 11/30/2013  . Pain in lower  limb 08/31/2013  . Charcot's joint of foot 07/07/2013  . Ulcer of other part of foot 06/15/2013  . Onychomycosis 06/15/2013  . Foot pain 06/15/2013  . Bilateral leg edema 04/20/2013  . 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 Shock 11/27/2012  . Acute blood loss anemia 11/27/2012  . Hypokalemia 11/27/2012  . HEMATOMA 11/15/2009  . LEG EDEMA 09/26/2009  . RHEUMATOID ARTHRITIS 06/07/2009  . OTITIS EXTERNA 01/10/2009  . CELLULITIS AND ABSCESS OF LEG EXCEPT FOOT 07/22/2008  . ALLERGIC REACTION 03/22/2008  . THYROID NODULE 07/01/2007  . LUNG NODULE 07/01/2007  . OSTEOPOROSIS 05/07/2007  . 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 BLINDNESS, BILATERAL 04/12/2007  . HEMORRHOIDS, INTERNAL 04/12/2007  . GOUTY ARTHROPATHY 01/14/2007  . HYPERLIPIDEMIA 01/07/2007  . HYPERTENSION 01/07/2007  . GERD 01/07/2007  . OSTEOARTHRITIS 01/07/2007  . [place] SYNDROME 01/07/2007    Past Medical History  Diagnosis Date  . Legally blind     since pt was a teenager  . Hearing aid worn     pt wears bilateral hearing aids  . Marfan's syndrome affecting skin     with scolosis  . Osteoporosis   . Hypertension   . Substance abuse     recovering alcoholic E93 years   . Hernia 4/08  . 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 Chronic gout 2008  . Stroke 2/08  . Fibroid   . Status post bunionectomy 1/10    bilateral   . Diverticulitis  of large intestine with perforation   . Small bowel obstruction due to adhesions   . Rheumatoid arthritis     sees Dr. Gavin Pound   . TB (pulmonary tuberculosis)   . SBO (small bowel obstruction) 01/05/2013    Past Surgical History  Procedure Laterality Date  . Bunionectomy Bilateral 1/10  . Total knee arthroplasty Bilateral 6/08  . Hernia repair    . Laparotomy N/A 11/27/2012    Procedure: EXPLORATORY LAPAROTOMY SIGMOID COLECTOMY, COLOSTOMY;  Surgeon: Madilyn Hook, DO;  Location: [facility];  Service:  General;  Laterality: N/A;  . Colostomy  11/27/2012  . Laparoscopic abdominal exploration N/A 01/06/2013    Procedure: LAPAROSCOPIC ABDOMINAL EXPLORATION;  Surgeon: Adin Hector, MD;  Location: [facility];  Service: General;  Laterality: N/A;  . Laparoscopic lysis of adhesions N/A 01/06/2013    Procedure: LAPAROSCOPIC LYSIS OF ADHESIONS/ INTEROTOMY REPAIR;  Surgeon: Adin Hector, MD;  Location: [facility];  Service: General;  Laterality: N/A;  . Colon surgery      colectomy    History   Social History  . Marital Status: Single    Spouse Name: N/A    Number of Children: N/A  . Years of Education: N/A   Occupational History  . Not on file.   Social History Main Topics  . Smoking status: Former Smoker -- 1.00 packs/day    Types: Cigarettes    Quit date: 11/27/2012  . Smokeless tobacco: Never Used  . Alcohol Use: No     Comment: recovering alcoholic  . Drug Use: No  . Sexual Activity: Yes    Partners: Male    Birth Control/ Protection: None     Comment: occasionally sexually active    Other Topics Concern  . Not on file   Social History Narrative  . No narrative on file    History reviewed. No pertinent family history.  Current Outpatient Prescriptions  Medication Sig Dispense Refill  . albuterol (PROVENTIL HFA;VENTOLIN HFA) 108 (90 BASE) MCG/ACT inhaler Inhale 1 puff into the lungs every 6 (six) hours as needed for wheezing or shortness of breath.      . allopurinol (ZYLOPRIM) 100 MG tablet TAKE ONE TABLET BY MOUTH ONCE DAILY  30 tablet  11  . amLODipine (NORVASC) 10 MG tablet take 1 tablet by mouth once daily  30 tablet  3  . Calcium Carbonate-Vitamin D (CALCIUM + D PO) Take 1,200 mg by mouth daily.      . cetirizine (ZYRTEC) 10 MG tablet take 1 tablet by mouth once daily  30 tablet  6  . Cyanocobalamin (VITAMIN B 12 PO) Take 1 tablet by mouth daily.      . cyclobenzaprine (FLEXERIL) 10 MG tablet Take 1 tablet (10 mg total) by mouth 3 (three) times daily as needed for  muscle spasms.  270 tablet  3  . docusate sodium (COLACE) 100 MG capsule Take 100 mg by mouth 2 (two) times daily.        . folic acid (FOLVITE) 1 MG tablet Take 1 mg by mouth daily.      . furosemide (LASIX) 20 MG tablet Take 1 tablet (20 mg total) by mouth daily.  90 tablet  3  . HYDROcodone-acetaminophen (NORCO) 10-325 MG per tablet Take 1 tablet by mouth every 6 (six) hours as needed for moderate pain.  120 tablet  0  . isoniazid (NYDRAZID) 300 MG tablet Take 300 mg by mouth daily.      Marland Kitchen ketoconazole (NIZORAL) 2 %  cream Apply 1 application topically 2 (two) times daily.  30 g  5  . KETOCONAZOLE, BULK, POWD Apply 1 application three times a day as needed.  5000 g  5  . megestrol (MEGACE ES) 625 MG/5ML suspension Take 5 mL (625 mg total) by mouth 2 (two) times daily before a meal.  150 mL  5  . methotrexate (50 MG/ML) 1 G injection Inject 80 mg into the vein once a week. Monday      . Multiple Vitamins-Minerals (MULTIVITAMIN PO) Take by mouth daily.      Marland Kitchen NEXIUM 40 MG capsule take 1 capsule by mouth twice a day  60 capsule  3  . pyridOXINE (VITAMIN B-6) 50 MG tablet Take 50 mg by mouth daily.      . risedronate (ACTONEL) 150 MG tablet take 1 tablet by mouth every 30 DAYS. DO NOT EAT OR LIE DOWN FOR 30 MINUTES AFTER TAKING MEDICATION  1 tablet  11  . traMADol (ULTRAM) 50 MG tablet Take 50 mg by mouth every 6 (six) hours as needed for moderate pain.      . vitamin C (ASCORBIC ACID) 500 MG tablet Take 500 mg by mouth daily.       No current facility-administered medications for this visit.     No Known Allergies  BP 136/78  Pulse 59  Temp(Src) 97.8 F (36.6 C)  Ht 5\' 8"  (1.727 m)  Wt 142 lb (64.411 kg)  BMI 21.60 kg/m2  LMP 06/25/2006  No results found.   Review of Systems  Constitutional: Positive for fatigue. Negative for fever, chills and diaphoresis.  HENT: Positive for hearing loss. Negative for ear pain, sore throat and trouble swallowing.   Eyes: Positive for visual  disturbance. Negative for photophobia.  Respiratory: Positive for shortness of breath. Negative for cough and choking.   Cardiovascular: Negative for chest pain and palpitations.  Gastrointestinal: Positive for constipation. Negative for nausea, vomiting, abdominal pain, diarrhea, anal bleeding and rectal pain.  Genitourinary: Negative for dysuria, urgency, frequency and difficulty urinating.  Musculoskeletal: Positive for arthralgias, myalgias and neck stiffness. Negative for gait problem.  Skin: Negative for color change, pallor and rash.  Neurological: Negative for dizziness, speech difficulty, weakness and numbness.  Hematological: Negative for adenopathy. Bruises/bleeds easily.  Psychiatric/Behavioral: Negative for confusion and agitation. The patient is not nervous/anxious.        Objective:   Physical Exam  Constitutional: She is oriented to person, place, and time. She appears well-developed and well-nourished. No distress.  HENT:  Head: Normocephalic.  Mouth/Throat: Oropharynx is clear and moist. No oropharyngeal exudate.  Eyes: Conjunctivae and EOM are normal. Pupils are equal, round, and reactive to light. No scleral icterus.  Neck: Normal range of motion. Neck supple. No tracheal deviation present.  Cardiovascular: Normal rate, regular rhythm and intact distal pulses.   Pulmonary/Chest: Effort normal and breath sounds normal. No stridor. No respiratory distress. She exhibits no tenderness.  Abdominal: Soft. She exhibits no distension and no mass. There is no tenderness. There is no rigidity, no rebound, no guarding, no tenderness at McBurney's point and negative Murphy's sign. A hernia is present. Hernia confirmed positive in the ventral area. Hernia confirmed negative in the right inguinal area and confirmed negative in the left inguinal area.    Genitourinary: No vaginal discharge found.  Musculoskeletal: Normal range of motion. She exhibits no tenderness.       Right elbow:  She exhibits normal range of motion.  Left elbow: She exhibits normal range of motion.       Right wrist: She exhibits normal range of motion.       Left wrist: She exhibits normal range of motion.       Right hand: Normal strength noted.       Left hand: Normal strength noted.  Moderate deformity of both ankles.  Incisions over all major joints c/w with prior joint replacements  Lymphadenopathy:       Head (right side): No posterior auricular adenopathy present.       Head (left side): No posterior auricular adenopathy present.    She has no cervical adenopathy.    She has no axillary adenopathy.       Right: No inguinal adenopathy present.       Left: No inguinal adenopathy present.  Neurological: She is alert and oriented to person, place, and time. No cranial nerve deficit. She exhibits normal muscle tone. Coordination normal.  Skin: Skin is warm and dry. No rash noted. She is not diaphoretic. No erythema.  Psychiatric: She has a normal mood and affect. Her behavior is normal. Judgment and thought content normal.       Assessment:     Intermittent irritation around the colostomy.  No evidence of active yeast infection cellulitis dermatitis at this time     Plan:     Continue dry antifungal powders to help prevent further yeast infections.  Things seem to be improving/stabilizing  Consider reconsultation with ostomy nurse @ [facility] to see if belted or concave/convex wafer would be of help to minimize leakage or slippage.  Increase fiber supplement to correct constipation from chronic narcotics  Consider abdominal binder to help hold in the ventral hernia.  The hernia is rather large, soft and reducible.  Operative risks in this patient rather high given fully immunosuppressed and Marfan's.  Ultimate treatment would be colostomy takedown and hernia repairs.  Primary repair most likely would fail and she would require repeat hernia repair with mesh.  Concern is if colostomy  takedown would heal.  Agree with my partners assessment in March that second opinion at a major academic center warranted before considering such surgery.  At this point, patient seems fine with colostomy.  Mostly wheelchair-bound and homebound already.  They will consider the recommendations & Expressed understanding and appreciation

## 2013-12-08 ENCOUNTER — Ambulatory Visit: Payer: Medicare Other | Admitting: Podiatry

## 2013-12-21 ENCOUNTER — Ambulatory Visit: Payer: Medicare Other | Admitting: Family Medicine

## 2013-12-21 ENCOUNTER — Telehealth: Payer: Self-pay | Admitting: Family Medicine

## 2013-12-21 NOTE — Telephone Encounter (Signed)
[employer] (case Freight forwarder) calling to have home health/skilled nursing set up for pt.  They are in network w/ [facility].  Pt needs instruction on how to change colostomy bag.  Daughter is going out of town this weekend and needs someone to come in and help w/ ostomy care for pt.  Daughter has been doing this, but will be gone for the weekend and needs someone to come in.Marland Kitchen

## 2013-12-22 NOTE — Telephone Encounter (Signed)
[facility] is calling back, states pt has to have the order by tomorrow morning.

## 2013-12-22 NOTE — Telephone Encounter (Signed)
I spoke with someone from [facility] and they are requesting a order for a nurse to come to pt's home and do the ostomy care. Pt would like to use [facility] if approved.

## 2013-12-24 NOTE — Telephone Encounter (Signed)
I called 3 home health agencies and I was told that none have any available nurses that can go out to pt's home and assist with this. I called pt's daughter Roslyn Smiling and gave this information. Pt will need to try and find a family member or a friend that can help over the weekend until her daughter gets back home from trip. The daughter agreed to work on getting some one to come over and help pt.

## 2013-12-24 NOTE — Telephone Encounter (Signed)
(   agencies that I called were [facility], and [facility] )

## 2013-12-24 NOTE — Telephone Encounter (Signed)
Please call to set this up ASAP

## 2013-12-24 NOTE — Telephone Encounter (Signed)
[name] from [facility] is following up to ensure this will be done today!!  [phone]   Ext [phone] Friday best, sat OK. Can you call [name] when done for an update!

## 2013-12-29 ENCOUNTER — Encounter: Payer: Self-pay | Admitting: Family Medicine

## 2013-12-29 ENCOUNTER — Ambulatory Visit (INDEPENDENT_AMBULATORY_CARE_PROVIDER_SITE_OTHER): Payer: Medicare Other | Admitting: Family Medicine

## 2013-12-29 VITALS — BP 95/77 | HR 132 | Temp 98.4°F

## 2013-12-29 DIAGNOSIS — I499 Cardiac arrhythmia, unspecified: Secondary | ICD-10-CM

## 2013-12-29 DIAGNOSIS — I498 Other specified cardiac arrhythmias: Secondary | ICD-10-CM

## 2013-12-29 DIAGNOSIS — I471 Supraventricular tachycardia: Secondary | ICD-10-CM

## 2013-12-29 MED ORDER — METOPROLOL SUCCINATE ER 25 MG PO TB24: 25.0000 mg | ORAL_TABLET | Freq: Every day | ORAL | Status: DC

## 2013-12-29 MED ORDER — ALBUTEROL SULFATE HFA 108 (90 BASE) MCG/ACT IN AERS: 1.0000 | INHALATION_SPRAY | RESPIRATORY_TRACT | Status: DC | PRN

## 2013-12-29 NOTE — Progress Notes (Signed)
Pre visit review using our clinic review tool, if applicable. No additional management support is needed unless otherwise documented below in the visit note. 

## 2013-12-29 NOTE — Progress Notes (Signed)
   Subjective:    Patient ID: [name], female    DOB: 1955-08-27, 58 y.o.   MRN: [id number]  HPI Here for increasing episodes of fatigue and SOB over the past month.  No chest pains. No excessive swelling of the feet or ankles. She has had a dry cough at times but no fever. She had labs in April showing slight anemia only, her glucose and renal function were fine.    Review of Systems  Constitutional: Positive for fatigue. Negative for fever, chills and diaphoresis.  Respiratory: Positive for cough and shortness of breath. Negative for chest tightness and wheezing.   Cardiovascular: Negative.   Gastrointestinal: Negative.   Neurological: Negative.        Objective:   Physical Exam  Constitutional: She is oriented to person, place, and time. She appears well-developed and well-nourished.  [name]   Cardiovascular: Normal heart sounds and intact distal pulses.  Exam reveals no gallop and no friction rub.   No murmur heard. Rate is rapid and rhythm is irregular. EKG shows a junctional tachycardia with a rate of 136   Pulmonary/Chest: Effort normal and breath sounds normal. No respiratory distress. She has no wheezes. She has no rales.  Musculoskeletal: She exhibits no edema.  Neurological: She is alert and oriented to person, place, and time.          Assessment & Plan:  We will start her on low dose metoprolol for rate control and arrange for her to see Cardiology in the next few days.

## 2013-12-30 ENCOUNTER — Telehealth: Payer: Self-pay | Admitting: Family Medicine

## 2013-12-30 NOTE — Telephone Encounter (Signed)
Pt states the new rx metoprolol succinate (TOPROL-XL) 25 MG 24 hr tablet Is not at the pharm. Can you pls resend? [facility]/[address]

## 2013-12-31 ENCOUNTER — Inpatient Hospital Stay (HOSPITAL_COMMUNITY)
Admission: EM | Admit: 2013-12-31 | Discharge: 2014-01-07 | DRG: 250 | Disposition: A | Discharge status: Skilled Nursing Facility | Payer: Medicare Other | Attending: Internal Medicine | Admitting: Internal Medicine

## 2013-12-31 ENCOUNTER — Encounter (HOSPITAL_COMMUNITY): Payer: Self-pay | Admitting: Emergency Medicine

## 2013-12-31 ENCOUNTER — Emergency Department (HOSPITAL_COMMUNITY): Payer: Medicare Other

## 2013-12-31 ENCOUNTER — Institutional Professional Consult (permissible substitution): Payer: Medicare Other | Admitting: Cardiology

## 2013-12-31 DIAGNOSIS — Q8740 Marfan's syndrome, unspecified: Secondary | ICD-10-CM | POA: Diagnosis not present

## 2013-12-31 DIAGNOSIS — Z79899 Other long term (current) drug therapy: Secondary | ICD-10-CM

## 2013-12-31 DIAGNOSIS — D509 Iron deficiency anemia, unspecified: Secondary | ICD-10-CM | POA: Diagnosis present

## 2013-12-31 DIAGNOSIS — N179 Acute kidney failure, unspecified: Secondary | ICD-10-CM | POA: Diagnosis not present

## 2013-12-31 DIAGNOSIS — I4892 Unspecified atrial flutter: Secondary | ICD-10-CM | POA: Diagnosis present

## 2013-12-31 DIAGNOSIS — I10 Essential (primary) hypertension: Secondary | ICD-10-CM | POA: Diagnosis present

## 2013-12-31 DIAGNOSIS — Z8673 Personal history of transient ischemic attack (TIA), and cerebral infarction without residual deficits: Secondary | ICD-10-CM | POA: Diagnosis not present

## 2013-12-31 DIAGNOSIS — Z96659 Presence of unspecified artificial knee joint: Secondary | ICD-10-CM | POA: Diagnosis not present

## 2013-12-31 DIAGNOSIS — I5041 Acute combined systolic (congestive) and diastolic (congestive) heart failure: Secondary | ICD-10-CM | POA: Diagnosis present

## 2013-12-31 DIAGNOSIS — K5909 Other constipation: Secondary | ICD-10-CM

## 2013-12-31 DIAGNOSIS — I471 Supraventricular tachycardia: Secondary | ICD-10-CM

## 2013-12-31 DIAGNOSIS — F1021 Alcohol dependence, in remission: Secondary | ICD-10-CM

## 2013-12-31 DIAGNOSIS — I959 Hypotension, unspecified: Secondary | ICD-10-CM | POA: Diagnosis not present

## 2013-12-31 DIAGNOSIS — I4719 Other supraventricular tachycardia: Secondary | ICD-10-CM

## 2013-12-31 DIAGNOSIS — I483 Typical atrial flutter: Secondary | ICD-10-CM

## 2013-12-31 DIAGNOSIS — R000 Tachycardia, unspecified: Secondary | ICD-10-CM | POA: Diagnosis present

## 2013-12-31 DIAGNOSIS — M1A00X0 Idiopathic chronic gout, unspecified site, without tophus (tophi): Secondary | ICD-10-CM | POA: Diagnosis present

## 2013-12-31 DIAGNOSIS — H9190 Unspecified hearing loss, unspecified ear: Secondary | ICD-10-CM | POA: Diagnosis present

## 2013-12-31 DIAGNOSIS — H548 Legal blindness, as defined in USA: Secondary | ICD-10-CM | POA: Diagnosis present

## 2013-12-31 DIAGNOSIS — L309 Dermatitis, unspecified: Secondary | ICD-10-CM

## 2013-12-31 DIAGNOSIS — Z933 Colostomy status: Secondary | ICD-10-CM | POA: Diagnosis not present

## 2013-12-31 DIAGNOSIS — I43 Cardiomyopathy in diseases classified elsewhere: Secondary | ICD-10-CM | POA: Diagnosis present

## 2013-12-31 DIAGNOSIS — I5031 Acute diastolic (congestive) heart failure: Secondary | ICD-10-CM

## 2013-12-31 DIAGNOSIS — I272 Pulmonary hypertension, unspecified: Secondary | ICD-10-CM | POA: Diagnosis present

## 2013-12-31 DIAGNOSIS — K219 Gastro-esophageal reflux disease without esophagitis: Secondary | ICD-10-CM | POA: Diagnosis present

## 2013-12-31 DIAGNOSIS — Z87891 Personal history of nicotine dependence: Secondary | ICD-10-CM

## 2013-12-31 DIAGNOSIS — I4891 Unspecified atrial fibrillation: Secondary | ICD-10-CM | POA: Diagnosis present

## 2013-12-31 DIAGNOSIS — K648 Other hemorrhoids: Secondary | ICD-10-CM

## 2013-12-31 DIAGNOSIS — E785 Hyperlipidemia, unspecified: Secondary | ICD-10-CM

## 2013-12-31 DIAGNOSIS — M069 Rheumatoid arthritis, unspecified: Secondary | ICD-10-CM | POA: Diagnosis present

## 2013-12-31 DIAGNOSIS — H547 Unspecified visual loss: Secondary | ICD-10-CM | POA: Diagnosis present

## 2013-12-31 DIAGNOSIS — M1A9XX0 Chronic gout, unspecified, without tophus (tophi): Secondary | ICD-10-CM | POA: Diagnosis present

## 2013-12-31 DIAGNOSIS — I509 Heart failure, unspecified: Secondary | ICD-10-CM | POA: Diagnosis present

## 2013-12-31 DIAGNOSIS — H543 Unqualified visual loss, both eyes: Secondary | ICD-10-CM

## 2013-12-31 DIAGNOSIS — E041 Nontoxic single thyroid nodule: Secondary | ICD-10-CM

## 2013-12-31 DIAGNOSIS — I5021 Acute systolic (congestive) heart failure: Secondary | ICD-10-CM

## 2013-12-31 DIAGNOSIS — I2789 Other specified pulmonary heart diseases: Secondary | ICD-10-CM | POA: Diagnosis present

## 2013-12-31 DIAGNOSIS — M810 Age-related osteoporosis without current pathological fracture: Secondary | ICD-10-CM | POA: Diagnosis present

## 2013-12-31 DIAGNOSIS — I498 Other specified cardiac arrhythmias: Secondary | ICD-10-CM | POA: Diagnosis not present

## 2013-12-31 DIAGNOSIS — K5720 Diverticulitis of large intestine with perforation and abscess without bleeding: Secondary | ICD-10-CM

## 2013-12-31 DIAGNOSIS — F1011 Alcohol abuse, in remission: Secondary | ICD-10-CM

## 2013-12-31 DIAGNOSIS — Z7189 Other specified counseling: Secondary | ICD-10-CM

## 2013-12-31 DIAGNOSIS — R7611 Nonspecific reaction to tuberculin skin test without active tuberculosis: Secondary | ICD-10-CM | POA: Diagnosis present

## 2013-12-31 DIAGNOSIS — D649 Anemia, unspecified: Secondary | ICD-10-CM

## 2013-12-31 DIAGNOSIS — J984 Other disorders of lung: Secondary | ICD-10-CM

## 2013-12-31 DIAGNOSIS — R600 Localized edema: Secondary | ICD-10-CM

## 2013-12-31 HISTORY — DX: Unspecified atrial flutter: I48.92

## 2013-12-31 LAB — TSH: TSH: 2.78 u[IU]/mL (ref 0.350–4.500)

## 2013-12-31 LAB — TROPONIN I
Troponin I: <0.3 ng/mL (ref <0.30)
Troponin I: <0.3 ng/mL (ref <0.30)

## 2013-12-31 LAB — CBC WITH DIFFERENTIAL/PLATELET
Basophils Absolute: 0 10*3/uL (ref 0.0–0.1)
Basophils Relative: 0 % (ref 0–1)
Eosinophils Absolute: 0.1 10*3/uL (ref 0.0–0.7)
Eosinophils Relative: 2 % (ref 0–5)
HCT: 38.2 % (ref 36.0–46.0)
Hemoglobin: 9.1 g/dL — ABNORMAL LOW (ref 12.0–15.0)
Lymphocytes Relative: 17 % (ref 12–46)
Lymphs Abs: 1.1 10*3/uL (ref 0.7–4.0)
MCH: 30.8 pg (ref 26.0–34.0)
MCHC: 23.8 g/dL — ABNORMAL LOW (ref 30.0–36.0)
MCV: 129.5 fL — ABNORMAL HIGH (ref 78.0–100.0)
Monocytes Absolute: 0.3 10*3/uL (ref 0.1–1.0)
Monocytes Relative: 5 % (ref 3–12)
Neutro Abs: 4.8 10*3/uL (ref 1.7–7.7)
Neutrophils Relative %: 76 % (ref 43–77)
Platelets: 227 10*3/uL (ref 150–400)
RBC: 2.95 MIL/uL — ABNORMAL LOW (ref 3.87–5.11)
RDW: 16.4 % — ABNORMAL HIGH (ref 11.5–15.5)
WBC: 6.3 10*3/uL (ref 4.0–10.5)

## 2013-12-31 LAB — BASIC METABOLIC PANEL
Anion gap: 15 (ref 5–15)
BUN: 17 mg/dL (ref 6–23)
CO2: 20 mEq/L (ref 19–32)
Calcium: 8.6 mg/dL (ref 8.4–10.5)
Chloride: 107 mEq/L (ref 96–112)
Creatinine, Ser: 1 mg/dL (ref 0.50–1.10)
GFR calc Af Amer: 71 mL/min — ABNORMAL LOW (ref >90)
GFR calc non Af Amer: 61 mL/min — ABNORMAL LOW (ref >90)
Glucose, Bld: 94 mg/dL (ref 70–99)
Potassium: 4.5 mEq/L (ref 3.7–5.3)
Sodium: 142 mEq/L (ref 137–147)

## 2013-12-31 LAB — SAMPLE TO BLOOD BANK

## 2013-12-31 LAB — RETICULOCYTES
RBC.: 3.19 MIL/uL — ABNORMAL LOW (ref 3.87–5.11)
Retic Count, Absolute: 35.1 10*3/uL (ref 19.0–186.0)
Retic Ct Pct: 1.1 % (ref 0.4–3.1)

## 2013-12-31 LAB — PRO B NATRIURETIC PEPTIDE: Pro B Natriuretic peptide (BNP): 6068 pg/mL — ABNORMAL HIGH (ref 0–125)

## 2013-12-31 LAB — MRSA PCR SCREENING: MRSA by PCR: NEGATIVE

## 2013-12-31 LAB — MAGNESIUM: Magnesium: 1.8 mg/dL (ref 1.5–2.5)

## 2013-12-31 MED ORDER — ALLOPURINOL 100 MG PO TABS
100.0000 mg | ORAL_TABLET | Freq: Every day | ORAL | Status: DC
  Administered 2013-12-31 – 2014-01-07 (×8): 100 mg via ORAL
  Filled 2013-12-31 (×8): qty 1

## 2013-12-31 MED ORDER — METHOTREXATE CHEMO INJECTION (PF) 1 GM **50 MG/ML**: 80.0000 mg | INTRAMUSCULAR | Status: DC

## 2013-12-31 MED ORDER — CYCLOBENZAPRINE HCL 10 MG PO TABS
10.0000 mg | ORAL_TABLET | Freq: Three times a day (TID) | ORAL | Status: DC | PRN
  Administered 2014-01-02 – 2014-01-07 (×5): 10 mg via ORAL
  Filled 2013-12-31 (×6): qty 1

## 2013-12-31 MED ORDER — ALBUTEROL SULFATE (2.5 MG/3ML) 0.083% IN NEBU: 2.5000 mg | INHALATION_SOLUTION | RESPIRATORY_TRACT | Status: DC | PRN

## 2013-12-31 MED ORDER — ISONIAZID 300 MG PO TABS
300.0000 mg | ORAL_TABLET | Freq: Every day | ORAL | Status: DC
  Administered 2013-12-31 – 2014-01-07 (×8): 300 mg via ORAL
  Filled 2013-12-31 (×9): qty 1

## 2013-12-31 MED ORDER — MEGESTROL ACETATE 400 MG/10ML PO SUSP
800.0000 mg | Freq: Two times a day (BID) | ORAL | Status: DC
  Administered 2013-12-31 – 2014-01-03 (×5): 800 mg via ORAL
  Filled 2013-12-31 (×12): qty 20

## 2013-12-31 MED ORDER — SODIUM CHLORIDE 0.9 % IJ SOLN
3.0000 mL | Freq: Two times a day (BID) | INTRAMUSCULAR | Status: DC
  Administered 2014-01-01: 3 mL via INTRAVENOUS

## 2013-12-31 MED ORDER — SODIUM CHLORIDE 0.9 % IV SOLN: 250.0000 mL | INTRAVENOUS | Status: DC | PRN

## 2013-12-31 MED ORDER — METHOTREXATE SODIUM CHEMO INJECTION 25 MG/ML
20.0000 mg | INTRAMUSCULAR | Status: DC
  Administered 2014-01-04: 20 mg via SUBCUTANEOUS
  Filled 2013-12-31: qty 0.8

## 2013-12-31 MED ORDER — ENOXAPARIN SODIUM 40 MG/0.4ML ~~LOC~~ SOLN
40.0000 mg | SUBCUTANEOUS | Status: DC
  Administered 2013-12-31: 40 mg via SUBCUTANEOUS
  Filled 2013-12-31 (×2): qty 0.4

## 2013-12-31 MED ORDER — ACETAMINOPHEN 650 MG RE SUPP: 650.0000 mg | Freq: Four times a day (QID) | RECTAL | Status: DC | PRN

## 2013-12-31 MED ORDER — METOPROLOL TARTRATE 1 MG/ML IV SOLN
2.5000 mg | INTRAVENOUS | Status: DC | PRN
  Administered 2013-12-31: 2.5 mg via INTRAVENOUS
  Filled 2013-12-31: qty 5

## 2013-12-31 MED ORDER — LORATADINE 10 MG PO TABS
10.0000 mg | ORAL_TABLET | Freq: Every day | ORAL | Status: DC
  Administered 2013-12-31 – 2014-01-07 (×8): 10 mg via ORAL
  Filled 2013-12-31 (×8): qty 1

## 2013-12-31 MED ORDER — VITAMIN C 500 MG PO TABS
500.0000 mg | ORAL_TABLET | Freq: Every day | ORAL | Status: DC
  Administered 2013-12-31 – 2014-01-07 (×8): 500 mg via ORAL
  Filled 2013-12-31 (×8): qty 1

## 2013-12-31 MED ORDER — FUROSEMIDE 10 MG/ML IJ SOLN
40.0000 mg | Freq: Once | INTRAMUSCULAR | Status: AC
  Administered 2013-12-31: 40 mg via INTRAVENOUS
  Filled 2013-12-31: qty 4

## 2013-12-31 MED ORDER — ALBUTEROL SULFATE HFA 108 (90 BASE) MCG/ACT IN AERS: 1.0000 | INHALATION_SPRAY | RESPIRATORY_TRACT | Status: DC | PRN

## 2013-12-31 MED ORDER — PANTOPRAZOLE SODIUM 40 MG PO TBEC
40.0000 mg | DELAYED_RELEASE_TABLET | Freq: Every day | ORAL | Status: DC
  Administered 2013-12-31 – 2014-01-07 (×8): 40 mg via ORAL
  Filled 2013-12-31 (×7): qty 1

## 2013-12-31 MED ORDER — FOLIC ACID 1 MG PO TABS
1.0000 mg | ORAL_TABLET | Freq: Every day | ORAL | Status: DC
  Administered 2013-12-31 – 2014-01-07 (×8): 1 mg via ORAL
  Filled 2013-12-31 (×8): qty 1

## 2013-12-31 MED ORDER — DILTIAZEM LOAD VIA INFUSION
10.0000 mg | Freq: Once | INTRAVENOUS | Status: AC
  Administered 2013-12-31: 10 mg via INTRAVENOUS
  Filled 2013-12-31: qty 10

## 2013-12-31 MED ORDER — ONDANSETRON HCL 4 MG/2ML IJ SOLN: 4.0000 mg | Freq: Four times a day (QID) | INTRAMUSCULAR | Status: DC | PRN

## 2013-12-31 MED ORDER — MEGESTROL ACETATE 625 MG/5ML PO SUSP: 625.0000 mg | Freq: Two times a day (BID) | ORAL | Status: DC

## 2013-12-31 MED ORDER — ASPIRIN EC 81 MG PO TBEC
81.0000 mg | DELAYED_RELEASE_TABLET | Freq: Every day | ORAL | Status: DC
  Administered 2013-12-31 – 2014-01-01 (×2): 81 mg via ORAL
  Filled 2013-12-31 (×2): qty 1

## 2013-12-31 MED ORDER — TRAMADOL HCL 50 MG PO TABS
50.0000 mg | ORAL_TABLET | Freq: Four times a day (QID) | ORAL | Status: DC | PRN
  Administered 2014-01-01 – 2014-01-05 (×4): 50 mg via ORAL
  Filled 2013-12-31 (×4): qty 1

## 2013-12-31 MED ORDER — FUROSEMIDE 10 MG/ML IJ SOLN
40.0000 mg | Freq: Two times a day (BID) | INTRAMUSCULAR | Status: DC
  Administered 2014-01-01: 40 mg via INTRAVENOUS
  Filled 2013-12-31 (×3): qty 4

## 2013-12-31 MED ORDER — SODIUM CHLORIDE 0.9 % IV SOLN
INTRAVENOUS | Status: DC
  Administered 2013-12-31: 75 mL/h via INTRAVENOUS

## 2013-12-31 MED ORDER — ONDANSETRON HCL 4 MG PO TABS: 4.0000 mg | ORAL_TABLET | Freq: Four times a day (QID) | ORAL | Status: DC | PRN

## 2013-12-31 MED ORDER — VITAMIN B-6 50 MG PO TABS
50.0000 mg | ORAL_TABLET | Freq: Every day | ORAL | Status: DC
  Administered 2013-12-31 – 2014-01-07 (×8): 50 mg via ORAL
  Filled 2013-12-31 (×8): qty 1

## 2013-12-31 MED ORDER — DOCUSATE SODIUM 100 MG PO CAPS
100.0000 mg | ORAL_CAPSULE | Freq: Two times a day (BID) | ORAL | Status: DC
  Administered 2013-12-31 – 2014-01-07 (×14): 100 mg via ORAL
  Filled 2013-12-31 (×15): qty 1

## 2013-12-31 MED ORDER — SODIUM CHLORIDE 0.9 % IV SOLN: INTRAVENOUS | Status: DC

## 2013-12-31 MED ORDER — ACETAMINOPHEN 325 MG PO TABS: 650.0000 mg | ORAL_TABLET | Freq: Four times a day (QID) | ORAL | Status: DC | PRN

## 2013-12-31 MED ORDER — HYDROCODONE-ACETAMINOPHEN 10-325 MG PO TABS
1.0000 | ORAL_TABLET | Freq: Four times a day (QID) | ORAL | Status: DC | PRN
  Administered 2014-01-01 – 2014-01-07 (×3): 1 via ORAL
  Filled 2013-12-31 (×5): qty 1

## 2013-12-31 MED ORDER — KETOCONAZOLE 2 % EX CREA
1.0000 "application " | TOPICAL_CREAM | Freq: Two times a day (BID) | CUTANEOUS | Status: DC
  Administered 2013-12-31 – 2014-01-07 (×12): 1 via TOPICAL
  Filled 2013-12-31 (×2): qty 15

## 2013-12-31 MED ORDER — SODIUM CHLORIDE 0.9 % IJ SOLN: 3.0000 mL | INTRAMUSCULAR | Status: DC | PRN

## 2013-12-31 MED ORDER — DILTIAZEM HCL 100 MG IV SOLR
0.0000 mg/h | INTRAVENOUS | Status: DC
  Administered 2013-12-31: 5 mg/h via INTRAVENOUS
  Administered 2013-12-31: 15 mg/h via INTRAVENOUS

## 2013-12-31 NOTE — ED Notes (Signed)
To ED via [facility] from home with c/o shortness of breath when walking to bedside commode, with palpitations. Pt went to regular dr on Tuesday and was dx with "A Fib" , has 1st appt with Dr. Mare Ferrari at 3pm today. On arrival-- alert, oriented, no shortness of breath at present. Denies chest pain. Received ASA 324mg  per EMS.    PT IS BLIND

## 2013-12-31 NOTE — ED Provider Notes (Signed)
CSN: [id number]     Arrival date & time 12/31/13  1042 History   First MD Initiated Contact with Patient 12/31/13 1051     Chief Complaint  Patient presents with  . Tachycardia  . Shortness of Breath      HPI Pt was seen at 1055. Per pt, c/o gradual onset and worsening of persistent palpitations and SOB for the past 1 month. Pt states her symptoms worsen when she ambulates to her bedside commode. Pt was evaluated by her PMD 2 days ago for same, rx metoprolol without change. Pt was due to see Cards Dr. Mare Ferrari this afternoon, but came to the ED for evaluation. Denies CP, no cough, no abd pain, no N/V/D, no fevers.     Past Medical History  Diagnosis Date  . Legally blind     since pt was a teenager  . Hearing aid worn     pt wears bilateral hearing aids  . Marfan's syndrome affecting skin     with scolosis  . Osteoporosis   . Hypertension   . Substance abuse     recovering alcoholic N23 years   . Hernia 4/08  . 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 Chronic gout 2008  . Stroke 2/08  . Fibroid   . Status post bunionectomy 1/10    bilateral   . Diverticulitis of large intestine with perforation   . Small bowel obstruction due to adhesions   . Rheumatoid arthritis     sees Dr. Gavin Pound   . SBO (small bowel obstruction) 01/05/2013   Past Surgical History  Procedure Laterality Date  . Bunionectomy Bilateral 1/10  . Total knee arthroplasty Bilateral 6/08  . Hernia repair    . Laparotomy N/A 11/27/2012    Procedure: EXPLORATORY LAPAROTOMY SIGMOID COLECTOMY, COLOSTOMY;  Surgeon: Madilyn Hook, DO;  Location: [facility];  Service: General;  Laterality: N/A;  . Colostomy  11/27/2012  . Laparoscopic abdominal exploration N/A 01/06/2013    Procedure: LAPAROSCOPIC ABDOMINAL EXPLORATION;  Surgeon: Adin Hector, MD;  Location: [facility];  Service: General;  Laterality: N/A;  . Laparoscopic lysis of adhesions N/A 01/06/2013    Procedure: LAPAROSCOPIC LYSIS OF ADHESIONS/  INTEROTOMY REPAIR;  Surgeon: Adin Hector, MD;  Location: [facility];  Service: General;  Laterality: N/A;  . Colon surgery      colectomy    History  Substance Use Topics  . Smoking status: Former Smoker -- 1.00 packs/day    Types: Cigarettes    Quit date: 11/27/2012  . Smokeless tobacco: Never Used  . Alcohol Use: No     Comment: recovering alcoholic sober since 5573   OB History   Grav Para Term Preterm Abortions TAB SAB Ect Mult Living   1 1 1       1      Review of Systems ROS: Statement: All systems negative except as marked or noted in the HPI; Constitutional: Negative for fever and chills. ; ; Eyes: Negative for eye pain, redness and discharge. ; ; ENMT: Negative for ear pain, hoarseness, nasal congestion, sinus pressure and sore throat. ; ; Cardiovascular: +SOB, palpitations, Negative for chest pain, diaphoresis, and peripheral edema. ; ; Respiratory: Negative for cough, wheezing and stridor. ; ; Gastrointestinal: Negative for nausea, vomiting, diarrhea, abdominal pain, blood in stool, hematemesis, jaundice and rectal bleeding. . ; ; Genitourinary: Negative for dysuria, flank pain and hematuria. ; ; Musculoskeletal: Negative for back pain and neck pain. Negative for  swelling and trauma.; ; Skin: Negative for pruritus, rash, abrasions, blisters, bruising and skin lesion.; ; Neuro: Negative for headache, lightheadedness and neck stiffness. Negative for weakness, altered level of consciousness , altered mental status, extremity weakness, paresthesias, involuntary movement, seizure and syncope.     Allergies  Review of patient's allergies indicates no known allergies.  Home Medications   Prior to Admission medications   Medication Sig Start Date End Date Taking? Authorizing Provider  albuterol (PROVENTIL HFA;VENTOLIN HFA) 108 (90 BASE) MCG/ACT inhaler Inhale 1 puff into the lungs every 4 (four) hours as needed for wheezing or shortness of breath. 12/29/13  Yes [name], MD   Calcium Carbonate-Vitamin D (CALCIUM + D PO) Take 1,200 mg by mouth daily.   Yes Historical Provider, MD  Cyanocobalamin (VITAMIN B 12 PO) Take 1 tablet by mouth daily.   Yes Historical Provider, MD  cyclobenzaprine (FLEXERIL) 10 MG tablet Take 1 tablet (10 mg total) by mouth 3 (three) times daily as needed for muscle spasms. 09/21/13  Yes [name], MD  docusate sodium (COLACE) 100 MG capsule Take 100 mg by mouth 2 (two) times daily.     Yes Historical Provider, MD  folic acid (FOLVITE) 1 MG tablet Take 1 mg by mouth daily.   Yes Historical Provider, MD  HYDROcodone-acetaminophen (NORCO) 10-325 MG per tablet Take 1 tablet by mouth every 6 (six) hours as needed for moderate pain. 11/06/13  Yes [name], MD  isoniazid (NYDRAZID) 300 MG tablet Take 300 mg by mouth daily.   Yes Historical Provider, MD  ketoconazole (NIZORAL) 2 % cream Apply 1 application topically 2 (two) times daily. 08/10/13  Yes [name], MD  megestrol (MEGACE ES) 625 MG/5ML suspension Take 5 mL (625 mg total) by mouth 2 (two) times daily before a meal. 02/09/13  Yes [name], MD  methotrexate (50 MG/ML) 1 G injection Inject 80 mg into the vein once a week. Monday   Yes Historical Provider, MD  metoprolol succinate (TOPROL-XL) 25 MG 24 hr tablet Take 1 tablet (25 mg total) by mouth daily. 12/29/13  Yes [name], MD  Multiple Vitamins-Minerals (MULTIVITAMIN PO) Take by mouth daily.   Yes Historical Provider, MD  NEXIUM 40 MG capsule take 1 capsule by mouth twice a day 11/20/13  Yes [name], MD  pyridOXINE (VITAMIN B-6) 50 MG tablet Take 50 mg by mouth daily.   Yes Historical Provider, MD  risedronate (ACTONEL) 150 MG tablet take 1 tablet by mouth every 30 DAYS. DO NOT EAT OR LIE DOWN FOR 30 MINUTES AFTER TAKING MEDICATION 06/30/13  Yes [name], MD  traMADol (ULTRAM) 50 MG tablet Take 50 mg by mouth every 6 (six) hours as needed for moderate pain.   Yes Historical Provider, MD  vitamin C (ASCORBIC ACID) 500  MG tablet Take 500 mg by mouth daily.   Yes Historical Provider, MD   BP 108/70  Pulse 124  Temp(Src) 98.4 F (36.9 C) (Oral)  Resp 16  SpO2 100%  LMP 06/25/2006 Filed Vitals:   12/31/13 1212 12/31/13 1230 12/31/13 1300 12/31/13 1355  BP: 103/74 108/70 118/78 113/76  Pulse:  124 125   Temp:      TempSrc:      Resp: 19 16 16 19   SpO2: 100% 100% 100% 100%    Physical Exam 1100: Physical examination:  Nursing notes reviewed; Vital signs and O2 SAT reviewed;  Constitutional: Well developed, Well nourished, Well hydrated, In  no acute distress; Head:  Normocephalic, atraumatic; Eyes: EOMI, PERRL, No scleral icterus; ENMT: Mouth and pharynx normal, Mucous membranes moist; Neck: Supple, Full range of motion, No lymphadenopathy; Cardiovascular: Tachycardic rate and rhythm, No gallop; Respiratory: Breath sounds coarse & equal bilaterally, No wheezes.  Speaking full sentences with ease, Normal respiratory effort/excursion; Chest: Nontender, Movement normal; Abdomen: Soft, Nontender, Nondistended, Normal bowel sounds; Genitourinary: No CVA tenderness; Extremities: Pulses normal, No tenderness, No edema, No calf edema or asymmetry.; Neuro: AA&Ox3, +blind per hx, otherwise major CN grossly intact.  Speech clear. Deformities of UE's and LE's per hx..; Skin: Color normal, Warm, Dry.   ED Course  Procedures     EKG Interpretation None      MDM  MDM Reviewed: previous chart, nursing note and vitals Reviewed previous: labs and ECG Interpretation: labs, ECG and x-ray      Date: 12/31/2013  Rate: 127  Rhythm: Junctional tachycardia  QRS Axis: left  Intervals: QT prolonged  ST/T Wave abnormalities: normal  Conduction Disutrbances:none  Narrative Interpretation:   Old EKG Reviewed: none available  Results for orders placed during the hospital encounter of 24/40/10  BASIC METABOLIC PANEL      Result Value Ref Range   Sodium 142  137 - 147 mEq/L   Potassium 4.5  3.7 - 5.3 mEq/L    Chloride 107  96 - 112 mEq/L   CO2 20  19 - 32 mEq/L   Glucose, Bld 94  70 - 99 mg/dL   BUN 17  6 - 23 mg/dL   Creatinine, Ser 1.00  0.50 - 1.10 mg/dL   Calcium 8.6  8.4 - 10.5 mg/dL   GFR calc non Af Amer 61 (*) >90 mL/min   GFR calc Af Amer 71 (*) >90 mL/min   Anion gap 15  5 - 15  CBC WITH DIFFERENTIAL      Result Value Ref Range   WBC 6.3  4.0 - 10.5 K/uL   RBC 2.95 (*) 3.87 - 5.11 MIL/uL   Hemoglobin 9.1 (*) 12.0 - 15.0 g/dL   HCT 38.2  36.0 - 46.0 %   MCV 129.5 (*) 78.0 - 100.0 fL   MCH 30.8  26.0 - 34.0 pg   MCHC 23.8 (*) 30.0 - 36.0 g/dL   RDW 16.4 (*) 11.5 - 15.5 %   Platelets 227  150 - 400 K/uL   Neutrophils Relative % 76  43 - 77 %   Lymphocytes Relative 17  12 - 46 %   Monocytes Relative 5  3 - 12 %   Eosinophils Relative 2  0 - 5 %   Basophils Relative 0  0 - 1 %   Neutro Abs 4.8  1.7 - 7.7 K/uL   Lymphs Abs 1.1  0.7 - 4.0 K/uL   Monocytes Absolute 0.3  0.1 - 1.0 K/uL   Eosinophils Absolute 0.1  0.0 - 0.7 K/uL   Basophils Absolute 0.0  0.0 - 0.1 K/uL   RBC Morphology ELLIPTOCYTES    TROPONIN I      Result Value Ref Range   Troponin I <0.30  <0.30 ng/mL  PRO B NATRIURETIC PEPTIDE      Result Value Ref Range   Pro B Natriuretic peptide (BNP) 6068.0 (*) 0 - 125 pg/mL  MAGNESIUM      Result Value Ref Range   Magnesium 1.8  1.5 - 2.5 mg/dL   Dg Chest Portable 1 View 12/31/2013   CLINICAL DATA:  Tachycardia and shortness of breath.  EXAM: PORTABLE CHEST - 1 VIEW  COMPARISON:  05/23/2013.  FINDINGS: 1123 hrs. Pulmonary vascular congestion noted with some probable interstitial edema dependently. No focal airspace consolidation or gross airspace pulmonary edema. The cardio pericardial silhouette is enlarged. Bones are diffusely demineralized. Chronic degenerative changes are seen in both shoulders. Telemetry leads overlie the chest.  IMPRESSION: Cardiomegaly with vascular congestion and probable dependent interstitial edema.   Electronically Signed   By: Misty Stanley  M.D.   On: 12/31/2013 12:08    1230:  BP and HR improved after judicious IVF bolus. Monitor continues with junctional tachycardia. CHF on CXR with elevated BNP, will dose IV lasix.  T/C to Cardiology, case discussed, including:  HPI, pertinent PM/SHx, VS/PE, dx testing, ED course and treatment:  Agreeable to consult, requests to admit to medicine service.  T/C to [facility] Dr. Dillard Essex, case discussed, including:  HPI, pertinent PM/SHx, VS/PE, dx testing, ED course and treatment:  Agreeable to admit, requests to write temporary orders, obtain stepdown bed to team 10.   Alfonzo Feller, DO 01/02/14 1441

## 2013-12-31 NOTE — ED Notes (Signed)
Report called to 2900--Josh, RN--

## 2013-12-31 NOTE — ED Notes (Signed)
Updated 2H that pt is being transported by this RN to their unit at this time.

## 2013-12-31 NOTE — ED Notes (Signed)
MD at bedside. 

## 2013-12-31 NOTE — Consult Note (Signed)
CARDIOLOGY CONSULT NOTE   Patient ID: [name] MRN: [id number], DOB/AGE: Feb 17, 1956   Admit date: 12/31/2013 Date of Consult: 12/31/2013   Primary Physician: [name], MD Primary Cardiologist: Dr. Mare Ferrari  Pt. Profile  58 year old African American female with past medical history of Marfan's syndrome, hypertension, history of alcoholism, stroke and rheumatoid arthritis presented to [facility] ED with 1 month onset of SOB and fatigue  Problem List  Past Medical History  Diagnosis Date  . Legally blind     since pt was a teenager  . Hearing aid worn     pt wears bilateral hearing aids  . Marfan's syndrome affecting skin     with scolosis  . Osteoporosis   . Hypertension   . Substance abuse     recovering alcoholic D78 years   . Hernia 4/08  . 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 Chronic gout 2008  . Stroke 2/08  . Fibroid   . Status post bunionectomy 1/10    bilateral   . Diverticulitis of large intestine with perforation   . Small bowel obstruction due to adhesions   . Rheumatoid arthritis     sees Dr. Gavin Pound   . SBO (small bowel obstruction) 01/05/2013    Past Surgical History  Procedure Laterality Date  . Bunionectomy Bilateral 1/10  . Total knee arthroplasty Bilateral 6/08  . Hernia repair    . Laparotomy N/A 11/27/2012    Procedure: EXPLORATORY LAPAROTOMY SIGMOID COLECTOMY, COLOSTOMY;  Surgeon: Madilyn Hook, DO;  Location: [facility];  Service: General;  Laterality: N/A;  . Colostomy  11/27/2012  . Laparoscopic abdominal exploration N/A 01/06/2013    Procedure: LAPAROSCOPIC ABDOMINAL EXPLORATION;  Surgeon: Adin Hector, MD;  Location: [facility];  Service: General;  Laterality: N/A;  . Laparoscopic lysis of adhesions N/A 01/06/2013    Procedure: LAPAROSCOPIC LYSIS OF ADHESIONS/ INTEROTOMY REPAIR;  Surgeon: Adin Hector, MD;  Location: [facility];  Service: General;  Laterality: N/A;  . Colon surgery      colectomy     Allergies  No Known  Allergies  HPI   The patient is a 58 year old African American female with past medical history of Marfan's syndrome, hypertension, history of alcoholism, stroke and rheumatoid arthritis. This unfortunate woman has also been blind in both eyes since early childhood and has a lot of bone deformity secondary to rheumatoid arthritis and have multiple orthopaedic surgeries in the past. She has no previous cardiac history and has never seen a cardiologist before. Patient had h/o alcoholism, however have been sober for the past 17 years. She quit smoking over a year ago. Due to her significant bone deformity, she can only ambulate at home with a walker or get around with a wheelchair. Her last echo was June 2014 which showed EF 55, no wall motion abnormalities, moderate MR. Patient saw his PCP Dr. Sarajane Jews on 12/29/2013 complaining of 1 month onset of dyspnea, nausea and weakness. She also has a chronic nonproductive cough in the last month as well along with some subjective fever and chill at home. An EKG was obtained in the office which showed tachycardia in the 120s concerning for junctional tachycardia vs a-fib. She was prescribed metoprolol however did not pick it up until 7/8 when she took a first dose at 8pm. She denied any chest pain at the time. Therefore, she was referred to Dr. Mare Ferrari as outpatient. Her appointment with Dr. Mare Ferrari is supposedly on 12/31/2013 at 3:30  p.m. however the patient presented to [facility] ED on the same day due to increasing shortness of breath. While in the ED, she denies any lower extremity edema, orthopnea, however does wake up occasionally with shortness of breath.  On arrival to the ED, she was noted to have a blood pressure of 90/81. Her heart rate was 120s. EKG was concerning for paroxysmal atrial tachycardia versus atrial flutter. Significant laboratory finding include worsening anemia since April with hemoglobin changed from 11.3 down to 9.1. MCV 129. MCHC 23.8. She appears  to have macrocytic and hypochromic anemia. Cr increase from 0.62 to 1.00. First troponin was negative. Pro BNP was severely elevated at 6000. Chest x-ray showed cardiomegaly with vascular congestion and probable dependent interstitial edema. Patient was given 40 mg of IV Lasix in the ED. Cardiology was consulted for heart failure.    Inpatient Medications     Family History Family History  Problem Relation Age of Onset  . Heart attack Neg Hx      Social History History   Social History  . Marital Status: Single    Spouse Name: N/A    Number of Children: N/A  . Years of Education: N/A   Occupational History  . Not on file.   Social History Main Topics  . Smoking status: Former Smoker -- 1.00 packs/day    Types: Cigarettes    Quit date: 11/27/2012  . Smokeless tobacco: Never Used  . Alcohol Use: No     Comment: recovering alcoholic sober since 7782  . Drug Use: No  . Sexual Activity: Yes    Partners: Male    Birth Control/ Protection: None     Comment: occasionally sexually active    Other Topics Concern  . Not on file   Social History Narrative  . No narrative on file     Review of Systems  General:  No night sweats or weight changes. +subjective fever and chill Cardiovascular:  No chest pain, edema, orthopnea, palpitations. + dyspnea, palpitation, paroxysmal nocturnal dyspnea. Dermatological: No rash, lesions/masses Respiratory:  +cough, dyspnea Urologic: No hematuria, dysuria Abdominal:   No vomiting, diarrhea, bright red blood per rectum, melena, or hematemesis +nausea, h/o large abdominal hernia, anemia Neurologic:  No visual changes, changes in mental status. +fatigue All other systems reviewed and are otherwise negative except as noted above.  Physical Exam  Blood pressure 118/78, pulse 125, temperature 98.4 F (36.9 C), temperature source Oral, resp. rate 16, last menstrual period 06/25/2006, SpO2 100.00%.  General: Pleasant, NAD Psych: flat Neuro:  Alert and oriented X 3. Moves all extremities spontaneously. HEENT: Blind, cannot see cornea on R, pupil unreactive on L  Neck: Supple without bruits or JVD. Lungs:  Resp regular and unlabored, scoliosis interfere with breathing, markedly decreased breath sound bilaterally, rale in R LE Heart: tachycardic no s3, s4, or murmurs. Abdomen: Soft, non-tender, non-distended, BS + x 4. Large hernia noted in lower abdomen, soft, nontender Extremities: No clubbing, cyanosis or edema. DP/PT/Radials 2+ and equal bilaterally. Severe bone deformity in bilateral upper, lower and spine  Labs   Recent Labs  12/31/13 1116  TROPONINI <0.30   Lab Results  Component Value Date   WBC 6.3 12/31/2013   HGB 9.1* 12/31/2013   HCT 38.2 12/31/2013   MCV 129.5* 12/31/2013   PLT 227 12/31/2013     Recent Labs Lab 12/31/13 1116  NA 142  K 4.5  CL 107  CO2 20  BUN 17  CREATININE 1.00  CALCIUM 8.6  GLUCOSE 94   Lab Results  Component Value Date   CHOL 142 12/02/2012   HDL 46 12/02/2012   LDLCALC 74 12/02/2012   TRIG 132 01/06/2013   Lab Results  Component Value Date   DDIMER 14.25* 05/23/2013    Radiology/Studies  Dg Chest Portable 1 View  12/31/2013   CLINICAL DATA:  Tachycardia and shortness of breath.  EXAM: PORTABLE CHEST - 1 VIEW  COMPARISON:  05/23/2013.  FINDINGS: 1123 hrs. Pulmonary vascular congestion noted with some probable interstitial edema dependently. No focal airspace consolidation or gross airspace pulmonary edema. The cardio pericardial silhouette is enlarged. Bones are diffusely demineralized. Chronic degenerative changes are seen in both shoulders. Telemetry leads overlie the chest.  IMPRESSION: Cardiomegaly with vascular congestion and probable dependent interstitial edema.   Electronically Signed   By: Misty Stanley M.D.   On: 12/31/2013 12:08    ECG   ?proxysmal atrial tachycardia vs a-flutter with HR 120s  ASSESSMENT AND PLAN  1. Presumed acute CHF in the setting of tachycardia     - may be related to combination of tachycardia, marfan, h/o alcoholism and anemia  - obtain Echocardiogram  - obtain TSH and Free T4  - stop home amlodipine to allow for room for rate control medication  2. ?proxysmal atrial tachycardia vs a-flutter vs SVT  - hard to discern the rhythm without slowing down the heart rate, but suspect atrial flutter with 2:1 conduction  - start diltiazem for rate control for now while assessing LV function  - symmetrical radial pulse, no chest pain, low suspicion for dissection with h/o marfan  3. Worsening macrocytic and hypochromic anemia  - hgb down to 9.1 from 11.3 since Apr  4. h/o marfan's syndrome 5. H/o rheumatoid arthritis 6. HTN 7. H/o alcoholism, sober for 17 yrs 8. H/o stroke   Signed, Almyra Deforest, [place] 12/31/2013, 1:48 PM  Patient seen, examined. Available data reviewed. Agree with findings, assessment, and plan as outlined by Almyra Deforest, PA-C. The patient has an extensive medical history, primarily related to Marfan syndrome. I have reviewed previous imaging studies that show no evidence of aortic aneurysm. She had normal left ventricular function one year ago by echocardiography. The results of her most recent CT scan and echocardiogram are pasted below:  2-D echocardiogram 12/01/2012: Study Conclusions  - Left ventricle: The cavity size was normal. Wall thickness was normal. The estimated ejection fraction was 55%. Wall motion was normal; there were no regional wall motion abnormalities. - Mitral valve: Moderate posteriorly directed MR. - Right ventricle: The cavity size was normal. Systolic function was mildly reduced. - Pulmonary arteries: PA peak pressure: 54mm Hg (S).  CT angiogram chest 05/23/2013: IMPRESSION:  1. Negative for acute PE or thoracic aortic dissection.  2. New T10 compression fracture deformity.  3. Atherosclerosis, including coronary artery disease. Please note  that although the presence of coronary artery  calcium documents the  presence of coronary artery disease, the severity of this disease  and any potential stenosis cannot be assessed on this non-gated CT  examination. Assessment for potential risk factor modification,  dietary therapy or pharmacologic therapy may be warranted, if  clinically indicated.  4. 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  5. Right thyroid nodule. Consider elective thyroid ultrasound when  stable for further characterization.  I agree with the documentation is provided above. Physical exam demonstrates a pleasant woman in no distress. Heart is tachycardic without murmur or gallop. Lungs  are diminished bilaterally. There is no peripheral edema. There are marked rheumatoid deformities of the hands and feet. The patient has acute on chronic congestive heart failure, presumably diastolic with her history of normal LVEF one year ago. This may be related to interval development of rapid atrial flutter. We will treat her with IV Cardizem, IV diuresis, and check a 2-D echocardiogram. Her initial cardiac enzymes are negative. However, pro BNP is markedly elevated. I think it is probably best to avoid anticoagulation at this point. The patient has multiple comorbidities and these include a new diagnosis of anemia with a hemoglobin drop from 11.3 in April of this year to 9.1 on this admission. Further plans pending her response to IV diltiazem, echo results, and clinical course.  Sherren Mocha, M.D. 12/31/2013 2:23 PM

## 2013-12-31 NOTE — H&P (Addendum)
[facility] History and Physical  [name] YNW:[id number] DOB: Jan 13, 1956 DOA: 12/31/2013  Referring physician: EDP PCP: [name], MD   Chief Complaint:worsening SOB and palpitations   HPI: [name] is a 58 y.o. female with past medical history significant for Marfan's syndrome, rheumatoid arthritis, hypertension, gout, legal blindness and hearing impairment with bilateral hearing aids, CVA, who presents with above complaints. She states that for the past one month she has had progressive shortness of breath and palpitations. 2 days ago Saw PCP due to worsening of the palpitations, and was started on metoprolol for possible junctional tachycardia. She states that she was scheduled to see cardiology-Dr. Mare Ferrari, but due to worsening of her symptoms she came to the ED P. today. She denies chest pain, fevers, cough, melena and no hematochezia.She was in ED and noted to be tachycardic, cardiology consulted rhythm on monitor per cards consistent with atrial fib with RVR. Labs revealed troponin of less than 0.3, hemoglobin of 9.1 down from 11, chest x-ray with cardiomegaly and vascular congestion and probable dependent interstitial edema, BNP elevated at 6068. She denies any leg swelling.  She reports that about a year ago she had leg swelling and was treated by her PCP with Lasix for some time and after it resolved this was discontinued. Echo in/2014 showed and EF of 55%. She is admitted for further evaluation and management.   Review of Systems The patient denies anorexia, fever, weight loss, hoarseness, chest pain, syncope, peripheral edema, balance deficits, hemoptysis, abdominal pain, melena, hematochezia, severe indigestion/heartburn, hematuria, incontinence, genital sores, muscle weakness, suspicious skin lesions, transient blindness, difficulty walking, depression, unusual weight change, abnormal bleeding.   Past Medical History  Diagnosis Date  . Legally blind      since pt was a teenager  . Hearing aid worn     pt wears bilateral hearing aids  . Marfan's syndrome affecting skin     with scolosis  . Osteoporosis   . Hypertension   . Substance abuse     recovering alcoholic M57 years   . Hernia 4/08  . 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 Chronic gout 2008  . Stroke 2/08  . Fibroid   . Status post bunionectomy 1/10    bilateral   . Diverticulitis of large intestine with perforation   . Small bowel obstruction due to adhesions   . Rheumatoid arthritis     sees Dr. Gavin Pound   . SBO (small bowel obstruction) 01/05/2013   Past Surgical History  Procedure Laterality Date  . Bunionectomy Bilateral 1/10  . Total knee arthroplasty Bilateral 6/08  . Hernia repair    . Laparotomy N/A 11/27/2012    Procedure: EXPLORATORY LAPAROTOMY SIGMOID COLECTOMY, COLOSTOMY;  Surgeon: Madilyn Hook, DO;  Location: [facility];  Service: General;  Laterality: N/A;  . Colostomy  11/27/2012  . Laparoscopic abdominal exploration N/A 01/06/2013    Procedure: LAPAROSCOPIC ABDOMINAL EXPLORATION;  Surgeon: Adin Hector, MD;  Location: [facility];  Service: General;  Laterality: N/A;  . Laparoscopic lysis of adhesions N/A 01/06/2013    Procedure: LAPAROSCOPIC LYSIS OF ADHESIONS/ INTEROTOMY REPAIR;  Surgeon: Adin Hector, MD;  Location: [facility];  Service: General;  Laterality: N/A;  . Colon surgery      colectomy   Social History:  reports that she quit smoking about 13 months ago. Her smoking use included Cigarettes. She smoked 1.00 pack per day. She has never used smokeless tobacco. She reports that  she does not drink alcohol or use illicit drugs.  No Known Allergies  Family History  Problem Relation Age of Onset  . Heart attack Neg Hx      Prior to Admission medications   Medication Sig Start Date End Date Taking? Authorizing Provider  albuterol (PROVENTIL HFA;VENTOLIN HFA) 108 (90 BASE) MCG/ACT inhaler Inhale 1 puff into the lungs every 4 (four) hours  as needed for wheezing or shortness of breath. 12/29/13  Yes [name], MD  allopurinol (ZYLOPRIM) 100 MG tablet Take 100 mg by mouth daily.   Yes Historical Provider, MD  amLODipine (NORVASC) 10 MG tablet Take 10 mg by mouth daily.   Yes Historical Provider, MD  Calcium Carbonate-Vitamin D (CALCIUM + D PO) Take 1,200 mg by mouth daily.   Yes Historical Provider, MD  cetirizine (ZYRTEC) 10 MG tablet Take 10 mg by mouth daily.   Yes Historical Provider, MD  Cyanocobalamin (VITAMIN B 12 PO) Take 1 tablet by mouth daily.   Yes Historical Provider, MD  cyclobenzaprine (FLEXERIL) 10 MG tablet Take 1 tablet (10 mg total) by mouth 3 (three) times daily as needed for muscle spasms. 09/21/13  Yes [name], MD  docusate sodium (COLACE) 100 MG capsule Take 100 mg by mouth 2 (two) times daily.     Yes Historical Provider, MD  folic acid (FOLVITE) 1 MG tablet Take 1 mg by mouth daily.   Yes Historical Provider, MD  HYDROcodone-acetaminophen (NORCO) 10-325 MG per tablet Take 1 tablet by mouth every 6 (six) hours as needed for moderate pain. 11/06/13  Yes [name], MD  isoniazid (NYDRAZID) 300 MG tablet Take 300 mg by mouth daily.   Yes Historical Provider, MD  ketoconazole (NIZORAL) 2 % cream Apply 1 application topically 2 (two) times daily. 08/10/13  Yes [name], MD  megestrol (MEGACE ES) 625 MG/5ML suspension Take 5 mL (625 mg total) by mouth 2 (two) times daily before a meal. 02/09/13  Yes [name], MD  methotrexate (50 MG/ML) 1 G injection Inject 80 mg into the vein once a week. Monday   Yes Historical Provider, MD  metoprolol succinate (TOPROL-XL) 25 MG 24 hr tablet Take 1 tablet (25 mg total) by mouth daily. 12/29/13  Yes [name], MD  Multiple Vitamins-Minerals (MULTIVITAMIN PO) Take 1 tablet by mouth daily.    Yes Historical Provider, MD  NEXIUM 40 MG capsule take 1 capsule by mouth twice a day 11/20/13  Yes [name], MD  pyridOXINE (VITAMIN B-6) 50 MG tablet Take 50 mg by mouth  daily.   Yes Historical Provider, MD  risedronate (ACTONEL) 150 MG tablet Take 150 mg by mouth every 30 (thirty) days. with water on empty stomach, nothing by mouth or lie down for next 30 minutes.   Yes Historical Provider, MD  traMADol (ULTRAM) 50 MG tablet Take 50 mg by mouth every 6 (six) hours as needed for moderate pain.   Yes Historical Provider, MD  vitamin C (ASCORBIC ACID) 500 MG tablet Take 500 mg by mouth daily.   Yes Historical Provider, MD   Physical Exam: Filed Vitals:   12/31/13 1624  BP: 122/79  Pulse: 128  Temp: 98.2 F (36.8 C)  Resp: 17    BP 122/79  Pulse 128  Temp(Src) 98.2 F (36.8 C) (Oral)  Resp 17  SpO2 100%  LMP 06/25/2006 Constitutional: Vital signs reviewed.  Patient is a well-developed and well-nourished, blind, in no acute distress and cooperative  with exam. oriented x3 in no apparent distress.  Head: Normocephalic and atraumatic Mouth: no erythema or exudates, MMM Eyes: PERRL, EOMI, conjunctivae normal, No scleral icterus.  Neck: Supple, Trachea midline normal ROM, No JVD, mass, thyromegaly, or carotid bruit present.  Cardiovascular: Regular, S1 normal, S2 normal,  pulses symmetric and intact bilaterally Pulmonary/Chest: normal respiratory effort, CTAB, no wheezes, rales, or rhonchi Abdominal: Soft. Non-tender, non-distended, bowel sounds are normal, no masses, organomegaly, or guarding present.  GU: no CVA tenderness  extremities: No cyanosis and no edema  Neurological: Strength is normal and symmetric bilaterally, cranial nerve II-XII are grossly intact, no focal motor deficit, .  Psychiatric: speech and behavior is normal.                Labs on Admission:  Basic Metabolic Panel:  Recent Labs Lab 12/31/13 1116  NA 142  K 4.5  CL 107  CO2 20  GLUCOSE 94  BUN 17  CREATININE 1.00  CALCIUM 8.6  MG 1.8   Liver Function Tests: No results found for this basename: AST, ALT, ALKPHOS, BILITOT, PROT, ALBUMIN,  in the last 168  hours No results found for this basename: LIPASE, AMYLASE,  in the last 168 hours No results found for this basename: AMMONIA,  in the last 168 hours CBC:  Recent Labs Lab 12/31/13 1116  WBC 6.3  NEUTROABS 4.8  HGB 9.1*  HCT 38.2  MCV 129.5*  PLT 227   Cardiac Enzymes:  Recent Labs Lab 12/31/13 1116  TROPONINI <0.30    BNP (last 3 results)  Recent Labs  12/31/13 1116  PROBNP 6068.0*   CBG: No results found for this basename: GLUCAP,  in the last 168 hours  Radiological Exams on Admission: Dg Chest Portable 1 View  12/31/2013   CLINICAL DATA:  Tachycardia and shortness of breath.  EXAM: PORTABLE CHEST - 1 VIEW  COMPARISON:  05/23/2013.  FINDINGS: 1123 hrs. Pulmonary vascular congestion noted with some probable interstitial edema dependently. No focal airspace consolidation or gross airspace pulmonary edema. The cardio pericardial silhouette is enlarged. Bones are diffusely demineralized. Chronic degenerative changes are seen in both shoulders. Telemetry leads overlie the chest.  IMPRESSION: Cardiomegaly with vascular congestion and probable dependent interstitial edema.   Electronically Signed   By: Misty Stanley M.D.   On: 12/31/2013 12:08      Assessment/Plan Active Problems:   Atrial flutter -As discussed above, obtain EKG, cycle cardiac enzymes, 2-D echo -Continue IV Cardizem as per cardiology recommendations -Obtain TSH and follow -Close monitoring step down unit   CHF, acute-likely diastolic -Likely precipitated by above, cycle cardiac enzymes, 2-D echo -Diuresis with IV Lasix as blood pressures tolerate -Strict I and os, daily weights   HYPERTENSION -Monitor on IV Cardizem as above   GERD -Continue PPI   Rheumatoid arthritis(714.0) -Continue outpatient medications   ABUSE, ALCOHOL, IN REMISSION -States she has had no alcohol in over 15 years     Marfan's syndrome Anemia, macrocytic -Obtain anemia panel-including folate and vitamin B 12 and  follow -Obtain stool guaiacs -As noted above she states she's had no alcohol in 15 years  Code Status: full Family Communication:none at bedside Disposition Plan: admi to step down  Time spent: >30  [facility] Pager [phone]

## 2013-12-31 NOTE — Progress Notes (Addendum)
RN paged secondary to pt still being tachycardic, but rhythm is now sinus. This was confirmed by 12 lead EKG. Will start to wean Cardizem drip and add metoprolol for ST > 110.  RN to call back if BP < 95 before giving meds or if pt reverts to arrythmia. Parameters placed on meds.  Baltazar Najjar, NP Update [phone]: HR 120s, ST. Cardizem gtt off. BP holding. Has had dose of Metoprolol prn. Baltazar Najjar

## 2013-12-31 NOTE — ED Notes (Signed)
Attempted to call report

## 2014-01-01 DIAGNOSIS — I272 Pulmonary hypertension, unspecified: Secondary | ICD-10-CM | POA: Diagnosis present

## 2014-01-01 DIAGNOSIS — D509 Iron deficiency anemia, unspecified: Secondary | ICD-10-CM | POA: Diagnosis present

## 2014-01-01 DIAGNOSIS — I059 Rheumatic mitral valve disease, unspecified: Secondary | ICD-10-CM

## 2014-01-01 DIAGNOSIS — N179 Acute kidney failure, unspecified: Secondary | ICD-10-CM | POA: Diagnosis present

## 2014-01-01 DIAGNOSIS — I428 Other cardiomyopathies: Secondary | ICD-10-CM

## 2014-01-01 DIAGNOSIS — R000 Tachycardia, unspecified: Secondary | ICD-10-CM | POA: Diagnosis present

## 2014-01-01 LAB — BASIC METABOLIC PANEL
Anion gap: 14 (ref 5–15)
BUN: 17 mg/dL (ref 6–23)
CO2: 25 mEq/L (ref 19–32)
Calcium: 8.6 mg/dL (ref 8.4–10.5)
Chloride: 102 mEq/L (ref 96–112)
Creatinine, Ser: 1.02 mg/dL (ref 0.50–1.10)
GFR calc Af Amer: 69 mL/min — ABNORMAL LOW (ref >90)
GFR calc non Af Amer: 60 mL/min — ABNORMAL LOW (ref >90)
Glucose, Bld: 127 mg/dL — ABNORMAL HIGH (ref 70–99)
Potassium: 4.1 mEq/L (ref 3.7–5.3)
Sodium: 141 mEq/L (ref 137–147)

## 2014-01-01 LAB — FERRITIN: Ferritin: 117 ng/mL (ref 10–291)

## 2014-01-01 LAB — D-DIMER, QUANTITATIVE: D-Dimer, Quant: 2.55 ug/mL-FEU — ABNORMAL HIGH (ref 0.00–0.48)

## 2014-01-01 LAB — CBC
HCT: 28.9 % — ABNORMAL LOW (ref 36.0–46.0)
Hemoglobin: 9.4 g/dL — ABNORMAL LOW (ref 12.0–15.0)
MCH: 30.5 pg (ref 26.0–34.0)
MCHC: 32.5 g/dL (ref 30.0–36.0)
MCV: 93.8 fL (ref 78.0–100.0)
Platelets: 256 10*3/uL (ref 150–400)
RBC: 3.08 MIL/uL — ABNORMAL LOW (ref 3.87–5.11)
RDW: 16.4 % — ABNORMAL HIGH (ref 11.5–15.5)
WBC: 5.6 10*3/uL (ref 4.0–10.5)

## 2014-01-01 LAB — IRON AND TIBC
Iron: 18 ug/dL — ABNORMAL LOW (ref 42–135)
Saturation Ratios: 7 % — ABNORMAL LOW (ref 20–55)
TIBC: 272 ug/dL (ref 250–470)
UIBC: 254 ug/dL (ref 125–400)

## 2014-01-01 LAB — FOLATE: Folate: >20 ng/mL

## 2014-01-01 LAB — TROPONIN I
Troponin I: <0.3 ng/mL (ref <0.30)
Troponin I: <0.3 ng/mL (ref <0.30)

## 2014-01-01 LAB — VITAMIN B12: Vitamin B-12: 1404 pg/mL — ABNORMAL HIGH (ref 211–911)

## 2014-01-01 MED ORDER — AMIODARONE HCL IN DEXTROSE 360-4.14 MG/200ML-% IV SOLN
60.0000 mg/h | INTRAVENOUS | Status: AC
  Administered 2014-01-01 (×2): 60 mg/h via INTRAVENOUS
  Filled 2014-01-01: qty 200

## 2014-01-01 MED ORDER — APIXABAN 5 MG PO TABS
5.0000 mg | ORAL_TABLET | Freq: Two times a day (BID) | ORAL | Status: DC
  Administered 2014-01-01 – 2014-01-07 (×13): 5 mg via ORAL
  Filled 2014-01-01 (×14): qty 1

## 2014-01-01 MED ORDER — AMIODARONE HCL IN DEXTROSE 360-4.14 MG/200ML-% IV SOLN
30.0000 mg/h | INTRAVENOUS | Status: DC
  Administered 2014-01-02: 30 mg/h via INTRAVENOUS
  Filled 2014-01-01 (×5): qty 200

## 2014-01-01 MED ORDER — METOPROLOL TARTRATE 1 MG/ML IV SOLN
2.5000 mg | INTRAVENOUS | Status: DC | PRN
  Filled 2014-01-01: qty 5

## 2014-01-01 MED ORDER — AMIODARONE LOAD VIA INFUSION
150.0000 mg | Freq: Once | INTRAVENOUS | Status: AC
  Administered 2014-01-01: 150 mg via INTRAVENOUS
  Filled 2014-01-01: qty 83.34

## 2014-01-01 NOTE — Progress Notes (Signed)
Echocardiogram 2D Echocardiogram has been performed.  [name] 01/01/2014, 2:28 PM

## 2014-01-01 NOTE — Progress Notes (Signed)
[facility]  [name] GEX:[id number] DOB: 1955-07-21 DOA: 12/31/2013 PCP: [name], MD  Brief narrative: 58 year old female patient with a history of Marfan syndrome, rheumatoid arthritis on chronic immunosuppression and hypertension. Patient endorsed that for one month she has been experiencing progressive shortness of breath with palpitations which worsened 48 hours prior to presentation. Because of these symptoms she initially presented to her primary care physician for evaluation and was started on metoprolol for possible junctional tachycardia. Also had a pending appointment to see cardiology but because of her symptoms worsening she presented to the emergency department.  In the ER she was noted to be tachycardic and rhythm was consistent with atrial fibrillation with RVR. Cardiology was consulted in the ER. Troponin was normal, hemoglobin was 9.1 which was down somewhat from previous reading of 11. Chest x-ray demonstrated cardiomegaly with vascular congestion and likely interstitial edema. A pro BNP was 6068. Patient had no clinical findings consistent with lower extremity edema. Echo from 2014 revealed preserved LV function with an EF of 55%.  HPI/Subjective: At rest no complaints of shortness of breath but apparently still has awareness of tachypalpitations  Assessment/Plan:    Atrial flutter/persistent Tachycardia -Appreciate cardiology assistance -Unfortunately had hypotension with Cardizem and Lopressor so now utilizing amiodarone -Cardiology plans on instituting Apixaban for anticoagulation  -Cardiology asking EP to evaluate for consideration for ablation and if she is a candidate she will need to undergo TEE to exclude thrombus - if successful anticoagulation could be discontinued 4 weeks later -TSH is normal    CHF, acute -Primarily mediated by tachycardia -Echo one year prior with preserved LV function -Followup echo  pending    Pulmonary HTN -Mild to moderate and treat medically noting was on Norvasc prior to admission but given recent hypotension in setting of tachycardia this medication currently on hold      HYPERTENSION -BP soft secondary to tachycardia -Holding home medications for now   Acute renal failure -GFR normal in April 2015 and now has decreased and suspect related to acute perfusion abnormalities from ongoing tachycardia    Rheumatoid arthritis -On chronic immunosuppression prior to admission -Continue methotrexate weekly with isoniazid and vitamin B 6    Marfan's syndrome -Echocardiogram in 2014 with normal aortic root and CT scan same year also without any evidence of aortic aneurysm    Iron deficiency anemia -Continue Aranesp -Hemoglobin stable    BLINDNESS, BILATERAL    GERD    OSTEOPOROSIS -History of T10 compression fracture noted on November 2014 CT scan  DVT prophylaxis: Apixaban Code Status: Full Family Communication: No family at bedside Disposition Plan/Expected LOS: Stepdown  Consultants: Cardiology EP  Procedures: 2-D echocardiogram - pending  Cultures:  None  Antibiotics: None  Objective: Blood pressure 96/68, pulse 131, temperature 98.6 F (37 C), temperature source Oral, resp. rate 15, height 5\' 8"  (1.727 m), weight 136 lb (61.689 kg), last menstrual period 06/25/2006, SpO2 97.00%.  Intake/Output Summary (Last 24 hours) at 01/01/14 1346 Last data filed at 01/01/14 0900  Gross per 24 hour  Intake   1351 ml  Output   1300 ml  Net     51 ml   Exam: General: No acute respiratory distress Lungs: Clear to auscultation bilaterally without wheezes or crackles Cardiovascular: Regular rate - rhythm consistent with atrial flutter - without murmur gallop or rub normal S1 and S2, no peripheral edema Abdomen: Nontender, nondistended, soft, bowel sounds positive, no rebound, no ascites, no  appreciable mass Musculoskeletal: No significant cyanosis,  clubbing of bilateral lower extremities   Scheduled Meds:  Scheduled Meds: . allopurinol  100 mg Oral Daily  . apixaban  5 mg Oral BID  . docusate sodium  100 mg Oral BID  . folic acid  1 mg Oral Daily  . isoniazid  300 mg Oral Daily  . ketoconazole  1 application Topical BID  . loratadine  10 mg Oral Daily  . megestrol  800 mg Oral BID AC  . [START ON 01/04/2014] methotrexate  20 mg Subcutaneous Weekly  . pantoprazole  40 mg Oral Daily  . pyridOXINE  50 mg Oral Daily  . sodium chloride  3 mL Intravenous Q12H  . vitamin C  500 mg Oral Daily   Data Reviewed: Basic Metabolic Panel:  Recent Labs Lab 12/31/13 1116 01/01/14 0130  NA 142 141  K 4.5 4.1  CL 107 102  CO2 20 25  GLUCOSE 94 127*  BUN 17 17  CREATININE 1.00 1.02  CALCIUM 8.6 8.6  MG 1.8  --    Liver Function Tests: No results found for this basename: AST, ALT, ALKPHOS, BILITOT, PROT, ALBUMIN,  in the last 168 hours  CBC:  Recent Labs Lab 12/31/13 1116 01/01/14 0300  WBC 6.3 5.6  NEUTROABS 4.8  --   HGB 9.1* 9.4*  HCT 38.2 28.9*  MCV 129.5* 93.8  PLT 227 256   Cardiac Enzymes:  Recent Labs Lab 12/31/13 1116 12/31/13 1950 01/01/14 0130 01/01/14 0641  TROPONINI <0.30 <0.30 <0.30 <0.30   BNP (last 3 results)  Recent Labs  12/31/13 1116  PROBNP 6068.0*    Recent Results (from the past 240 hour(s))  MRSA PCR SCREENING     Status: None   Collection Time    12/31/13  4:25 PM      Result Value Ref Range Status   MRSA by PCR NEGATIVE  NEGATIVE Final   Comment:            The GeneXpert MRSA Assay (FDA     approved for NASAL specimens     only), is one component of a     comprehensive MRSA colonization     surveillance program. It is not     intended to diagnose MRSA     infection nor to guide or     monitor treatment for     MRSA infections.     Studies:  Recent x-ray studies have been reviewed in detail by the Attending Physician  Time spent :  Azure,  ANP [facility] Office  [phone] Pager [phone]   **If unable to reach the above provider after paging please contact the [place] @ [phone]  On-Call/Text Page:      [email or web address]      password [email or web address]  If 7PM-7AM, please contact night-coverage [email or web address] Password [email or web address] 01/01/2014, 1:46 PM   LOS: 1 day   I have personally examined this patient and reviewed the entire database. I have reviewed the above note, made any necessary editorial changes, and agree with its content.  Cherene Altes, MD [facility]

## 2014-01-01 NOTE — Progress Notes (Signed)
Nutrition Brief Note  Patient identified on the Malnutrition Screening Tool (MST) Report. Pt is currently eating 100% of meals and is tolerating diet well. Weights have been variable, but overall stable, as seen below:  Wt Readings from Last 15 Encounters:  12/31/13 136 lb (61.689 kg)  11/30/13 142 lb (64.411 kg)  09/30/13 140 lb (63.504 kg)  09/16/13 142 lb (64.411 kg)  04/20/13 144 lb (65.318 kg)  02/26/13 139 lb 5.3 oz (63.2 kg)  01/15/13 134 lb 3.2 oz (60.873 kg)  01/01/13 134 lb 14.7 oz (61.2 kg)  01/01/13 134 lb 14.7 oz (61.2 kg)  12/18/12 131 lb (59.421 kg)  12/02/12 151 lb 3.8 oz (68.6 kg)  12/02/12 151 lb 3.8 oz (68.6 kg)  10/21/12 144 lb (65.318 kg)  10/08/12 142 lb (64.411 kg)  02/27/12 136 lb (61.689 kg)    Body mass index is 20.68 kg/(m^2). Patient meets criteria for normal weight based on current BMI.   Current diet order is Heart Healthy, patient is consuming approximately 100% of meals at this time. Labs and medications reviewed.   No nutrition interventions warranted at this time. If nutrition issues arise, please consult RD.   Inda Coke MS, RD, LDN Inpatient Registered Dietitian Pager: [phone] After-hours pager: [phone]

## 2014-01-01 NOTE — Progress Notes (Signed)
    Subjective:  Denies CP or dyspnea   Objective:  Filed Vitals:   01/01/14 0800 01/01/14 0808 01/01/14 0811 01/01/14 0813  BP: 112/80 115/77 105/81 115/75  Pulse:  127 126 126  Temp:    98.9 F (37.2 C)  TempSrc:    Oral  Resp: 16   15  Height:      Weight:      SpO2:    95%    Intake/Output from previous day:  Intake/Output Summary (Last 24 hours) at 01/01/14 1057 Last data filed at 01/01/14 0500  Gross per 24 hour  Intake    871 ml  Output   1300 ml  Net   -429 ml    Physical Exam: Physical exam: Well-developed frail in no acute distress.  Skin is warm and dry.  HEENT is normal. Patient is blind Neck is supple.  Chest is clear to auscultation with normal expansion.  Cardiovascular exam is tachycardic and regular Abdominal exam nontender or distended. S/P colostomy Extremities show no edema. Severe deformities of upper extremities bilaterally neuro grossly intact    Lab Results: Basic Metabolic Panel:  Recent Labs  12/31/13 1116 01/01/14 0130  NA 142 141  K 4.5 4.1  CL 107 102  CO2 20 25  GLUCOSE 94 127*  BUN 17 17  CREATININE 1.00 1.02  CALCIUM 8.6 8.6  MG 1.8  --    CBC:  Recent Labs  12/31/13 1116 01/01/14 0300  WBC 6.3 5.6  NEUTROABS 4.8  --   HGB 9.1* 9.4*  HCT 38.2 28.9*  MCV 129.5* 93.8  PLT 227 256   Cardiac Enzymes:  Recent Labs  12/31/13 1950 01/01/14 0130 01/01/14 0641  TROPONINI <0.30 <0.30 <0.30     Assessment/Plan:  1 atrial flutter-patient is noted to have typical flutter. She has to become hypotensive with Cardizem and Lopressor. I will try and control heart rate with amiodarone. Very difficult situation. She has multiple chronic medical problems but is able to ambulate with a walker. She is at increased risk for long-term anticoagulation given fall risk. I think best option would be to begin apixaban. I will ask electrophysiology to evaluate to consider ablation. If they feel she is a candidate we will perform  TEE prior to procedure to exclude thrombus. Anticoagulation could then be discontinued 4 weeks later. TSH is normal. Await echocardiogram. Given history of Marfan's I was concerned about the possibility of aortic aneurysm which would effect decision concerning anticoagulation. However CT scan in November of 2014 showed no aneurysm. 2 acute diastolic CHF-await echocardiogram. CHF most likely related to atrial flutter. Given hypotension I will hold on further diuresis. 3 Chronic normocytic anemia-Evaluation per primary care. 4 rheumatoid arthritis 5 history of Marfan's   Kirk Ruths 01/01/2014, 10:57 AM

## 2014-01-01 NOTE — Consult Note (Signed)
ELECTROPHYSIOLOGY CONSULT NOTE    Patient ID: [name] MRN: [id number], DOB/AGE: 09-27-1955 58 y.o.  Admit date: 12/31/2013 Date of Consult: 01-01-2014  Primary Physician: [name], MD Primary Cardiologist: new to [facility]  Reason for Consultation: atrial flutter  HPI:  [name] is a 58 y.o. female with a past medical history significant for Marfan's syndrome, hypertension, prior CVA, and rheumatoid arthritis.  She is legally blind and lives in an a home.  She has a one month history of progressive shortness of breath.  She was evaluated by her PCP and EKG demonstrated atrial flutter at 120bpm.  She was referred to [facility] for further evaluation.  Her rates have been difficult to control with Cardizem and Metroprolol.  She has been placed on Amiodarone today.  EP has been asked to evaluate for treatment options.   She states she has had palpitations, cough, shortness of breath, and subjective fevers and chills for the past month.  She denies chest pain, lower extremity edema, dizziness, or pre-syncope.  ROS is otherwise negative.   Echocardiogram 11-2012 demonstrated EF 55%, moderate MR, LA 37.  Repeat echo this admission is pending.   Lab work is notable for HGB 9.4, negative cardiac enzymes, electrolytes normal.   Past Medical History  Diagnosis Date  . Legally blind     since pt was a teenager  . Hearing aid worn     pt wears bilateral hearing aids  . Marfan's syndrome affecting skin     with scolosis  . Osteoporosis   . Hypertension   . Substance abuse     recovering alcoholic G26 years   . Hernia 4/08  . 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 Chronic gout 2008  . Stroke 2/08  . Fibroid   . Status post bunionectomy 1/10    bilateral   . Diverticulitis of large intestine with perforation   . Small bowel obstruction due to adhesions   . Rheumatoid arthritis     sees Dr. Gavin Pound   . SBO (small bowel obstruction) 01/05/2013      Surgical History:  Past Surgical History  Procedure Laterality Date  . Bunionectomy Bilateral 1/10  . Total knee arthroplasty Bilateral 6/08  . Hernia repair    . Laparotomy N/A 11/27/2012    Procedure: EXPLORATORY LAPAROTOMY SIGMOID COLECTOMY, COLOSTOMY;  Surgeon: Madilyn Hook, DO;  Location: [facility];  Service: General;  Laterality: N/A;  . Colostomy  11/27/2012  . Laparoscopic abdominal exploration N/A 01/06/2013    Procedure: LAPAROSCOPIC ABDOMINAL EXPLORATION;  Surgeon: Adin Hector, MD;  Location: [facility];  Service: General;  Laterality: N/A;  . Laparoscopic lysis of adhesions N/A 01/06/2013    Procedure: LAPAROSCOPIC LYSIS OF ADHESIONS/ INTEROTOMY REPAIR;  Surgeon: Adin Hector, MD;  Location: [facility];  Service: General;  Laterality: N/A;  . Colon surgery      colectomy     Prescriptions prior to admission  Medication Sig Dispense Refill  . albuterol (PROVENTIL HFA;VENTOLIN HFA) 108 (90 BASE) MCG/ACT inhaler Inhale 1 puff into the lungs every 4 (four) hours as needed for wheezing or shortness of breath.  1 Inhaler  11  . allopurinol (ZYLOPRIM) 100 MG tablet Take 100 mg by mouth daily.      Marland Kitchen amLODipine (NORVASC) 10 MG tablet Take 10 mg by mouth daily.      . Calcium Carbonate-Vitamin D (CALCIUM + D PO) Take 1,200 mg by mouth daily.      Marland Kitchen  cetirizine (ZYRTEC) 10 MG tablet Take 10 mg by mouth daily.      . Cyanocobalamin (VITAMIN B 12 PO) Take 1 tablet by mouth daily.      . cyclobenzaprine (FLEXERIL) 10 MG tablet Take 1 tablet (10 mg total) by mouth 3 (three) times daily as needed for muscle spasms.  270 tablet  3  . docusate sodium (COLACE) 100 MG capsule Take 100 mg by mouth 2 (two) times daily.        . folic acid (FOLVITE) 1 MG tablet Take 1 mg by mouth daily.      Marland Kitchen HYDROcodone-acetaminophen (NORCO) 10-325 MG per tablet Take 1 tablet by mouth every 6 (six) hours as needed for moderate pain.  120 tablet  0  . isoniazid (NYDRAZID) 300 MG tablet Take 300 mg by mouth daily.       Marland Kitchen ketoconazole (NIZORAL) 2 % cream Apply 1 application topically 2 (two) times daily.  30 g  5  . megestrol (MEGACE ES) 625 MG/5ML suspension Take 5 mL (625 mg total) by mouth 2 (two) times daily before a meal.  150 mL  5  . methotrexate 25 MG/ML injection Inject 20 mg into the skin once a week. Take on Mondays      . metoprolol succinate (TOPROL-XL) 25 MG 24 hr tablet Take 1 tablet (25 mg total) by mouth daily.  90 tablet  3  . Multiple Vitamins-Minerals (MULTIVITAMIN PO) Take 1 tablet by mouth daily.       Marland Kitchen NEXIUM 40 MG capsule take 1 capsule by mouth twice a day  60 capsule  3  . pyridOXINE (VITAMIN B-6) 50 MG tablet Take 50 mg by mouth daily.      . risedronate (ACTONEL) 150 MG tablet Take 150 mg by mouth every 30 (thirty) days. with water on empty stomach, nothing by mouth or lie down for next 30 minutes.      . traMADol (ULTRAM) 50 MG tablet Take 50 mg by mouth every 6 (six) hours as needed for moderate pain.      . vitamin C (ASCORBIC ACID) 500 MG tablet Take 500 mg by mouth daily.      . [DISCONTINUED] methotrexate (50 MG/ML) 1 G injection Inject 80 mg into the vein once a week. Monday        Inpatient Medications:  . allopurinol  100 mg Oral Daily  . amiodarone  150 mg Intravenous Once  . apixaban  5 mg Oral BID  . docusate sodium  100 mg Oral BID  . folic acid  1 mg Oral Daily  . isoniazid  300 mg Oral Daily  . ketoconazole  1 application Topical BID  . loratadine  10 mg Oral Daily  . megestrol  800 mg Oral BID AC  . [START ON 01/04/2014] methotrexate  20 mg Subcutaneous Weekly  . pantoprazole  40 mg Oral Daily  . pyridOXINE  50 mg Oral Daily  . sodium chloride  3 mL Intravenous Q12H  . vitamin C  500 mg Oral Daily    Allergies: No Known Allergies  History   Social History  . Marital Status: Single    Spouse Name: N/A    Number of Children: N/A  . Years of Education: N/A   Occupational History  . Not on file.   Social History Main Topics  . Smoking status:  Former Smoker -- 1.00 packs/day    Types: Cigarettes    Quit date: 11/27/2012  . Smokeless tobacco: Never  Used  . Alcohol Use: No     Comment: recovering alcoholic sober since [age]  . Drug Use: No  . Sexual Activity: Yes    Partners: Male    Birth Control/ Protection: None     Comment: occasionally sexually active    Other Topics Concern  . Not on file   Social History Narrative  . No narrative on file     Family History  Problem Relation Age of Onset  . Heart attack Neg Hx     BP 96/68  Pulse 131  Temp(Src) 98.6 F (37 C) (Oral)  Resp 15  Ht 5\' 8"  (1.727 m)  Wt 136 lb (61.689 kg)  BMI 20.68 kg/m2  SpO2 97%  LMP 06/25/2006  Physical Exam: Physical Exam: Filed Vitals:   01/01/14 1900 01/01/14 1954 01/01/14 2000 01/01/14 2200  BP: 95/68  106/74 98/56  Pulse:      Temp:  98.6 F (37 C)    TempSrc:  Oral    Resp: 15  16 18   Height:      Weight:      SpO2:  99%      GEN- The patient is very frail and chronically ill appearing, alert and oriented x 3 today.   Head- normocephalic, atraumatic Eyes-  R eye enucleated, poor vision in L eye Ears- wears hearing aids Oropharynx- clear Neck- supple, Lungs- Clear to ausculation bilaterally, normal work of breathing Heart- irregular rate and rhythm, 3/6 SEM LSB GI- soft, NT, ND, + BS Extremities- marked deformity of the wrists/ feet MS- diffuse muscle atrophy, marked deformity of rheumatoid arthritis Skin- no rash or lesion Psych- euthymic mood, full affect    Labs:   Lab Results  Component Value Date   WBC 5.6 01/01/2014   HGB 9.4* 01/01/2014   HCT 28.9* 01/01/2014   MCV 93.8 01/01/2014   PLT 256 01/01/2014    Recent Labs Lab 01/01/14 0130  NA 141  K 4.1  CL 102  CO2 25  BUN 17  CREATININE 1.02  CALCIUM 8.6  GLUCOSE 127*     Radiology/Studies: Dg Chest Portable 1 View 12/31/2013   CLINICAL DATA:  Tachycardia and shortness of breath.  EXAM: PORTABLE CHEST - 1 VIEW  COMPARISON:  05/23/2013.  FINDINGS:  1123 hrs. Pulmonary vascular congestion noted with some probable interstitial edema dependently. No focal airspace consolidation or gross airspace pulmonary edema. The cardio pericardial silhouette is enlarged. Bones are diffusely demineralized. Chronic degenerative changes are seen in both shoulders. Telemetry leads overlie the chest.  IMPRESSION: Cardiomegaly with vascular congestion and probable dependent interstitial edema.   Electronically Signed   By: Misty Stanley M.D.   On: 12/31/2013 12:08    EKG: 2:1 typical atrial flutter, ventricular rate 133, narrow QRS  TELEMETRY: atrial flutter, ventricular rates 120-130's  A/P  1. Atrial flutter The patient has symptomatic typical appearing atrial flutter.  By history, she has had atrial flutter with RVR for a month or so.  She has been initiated on anticoagulation by Dr Stanford Breed.  I would recommend that she continue eliquis 5mg  BID over the weekend and then proceed with TEE guided atrial flutter ablation on Monday. I had a long discussion with the patient and her daughter (by phone) today.  They understand the risks and wish to proceed.  Dr Lovena Le will discuss with them further on Monday. In the interim, I would be conservative with her heart rates.  They have been 150s bpm for a month or so.  I think that if we are too aggressive in trying to control heart rates over the weekend that we could do her more harm than good.  2. Nonischemic CM Very likely to be tachycardia mediated I would recommend that we restore sinus rhythm as above and then reassess EF in 3 months. Presently her BP will not allow medical therapies for reduced EF.  Her long term prognosis is quite poor.  I do think that she would do better with ablation for her atrial flutter.  EP to see again on Monday.  General cardiology to manage over the weekend.

## 2014-01-01 NOTE — Progress Notes (Signed)
Pt hr 150bpm for 1 min, called and advised MD. HR returned to 92 without complication, pt bp stable 101/67  Remained asymptomatic during event.

## 2014-01-02 DIAGNOSIS — H543 Unqualified visual loss, both eyes: Secondary | ICD-10-CM

## 2014-01-02 DIAGNOSIS — I10 Essential (primary) hypertension: Secondary | ICD-10-CM

## 2014-01-02 DIAGNOSIS — I2789 Other specified pulmonary heart diseases: Secondary | ICD-10-CM

## 2014-01-02 DIAGNOSIS — Q8740 Marfan's syndrome, unspecified: Secondary | ICD-10-CM

## 2014-01-02 LAB — COMPREHENSIVE METABOLIC PANEL
ALT: 36 U/L — ABNORMAL HIGH (ref 0–35)
AST: 24 U/L (ref 0–37)
Albumin: 3.2 g/dL — ABNORMAL LOW (ref 3.5–5.2)
Alkaline Phosphatase: 89 U/L (ref 39–117)
Anion gap: 14 (ref 5–15)
BUN: 16 mg/dL (ref 6–23)
CO2: 24 mEq/L (ref 19–32)
Calcium: 8.7 mg/dL (ref 8.4–10.5)
Chloride: 103 mEq/L (ref 96–112)
Creatinine, Ser: 1.15 mg/dL — ABNORMAL HIGH (ref 0.50–1.10)
GFR calc Af Amer: 60 mL/min — ABNORMAL LOW (ref >90)
GFR calc non Af Amer: 52 mL/min — ABNORMAL LOW (ref >90)
Glucose, Bld: 119 mg/dL — ABNORMAL HIGH (ref 70–99)
Potassium: 4.3 mEq/L (ref 3.7–5.3)
Sodium: 141 mEq/L (ref 137–147)
Total Bilirubin: 0.4 mg/dL (ref 0.3–1.2)
Total Protein: 6.7 g/dL (ref 6.0–8.3)

## 2014-01-02 LAB — CBC
HCT: 30 % — ABNORMAL LOW (ref 36.0–46.0)
Hemoglobin: 9.5 g/dL — ABNORMAL LOW (ref 12.0–15.0)
MCH: 29.6 pg (ref 26.0–34.0)
MCHC: 31.7 g/dL (ref 30.0–36.0)
MCV: 93.5 fL (ref 78.0–100.0)
Platelets: 289 10*3/uL (ref 150–400)
RBC: 3.21 MIL/uL — ABNORMAL LOW (ref 3.87–5.11)
RDW: 16.4 % — ABNORMAL HIGH (ref 11.5–15.5)
WBC: 6.1 10*3/uL (ref 4.0–10.5)

## 2014-01-02 MED ORDER — FUROSEMIDE 20 MG PO TABS
20.0000 mg | ORAL_TABLET | Freq: Every day | ORAL | Status: DC
  Administered 2014-01-02 – 2014-01-07 (×6): 20 mg via ORAL
  Filled 2014-01-02 (×6): qty 1

## 2014-01-02 MED ORDER — LEVALBUTEROL HCL 0.63 MG/3ML IN NEBU: 0.6300 mg | INHALATION_SOLUTION | RESPIRATORY_TRACT | Status: DC | PRN

## 2014-01-02 MED ORDER — CARVEDILOL 3.125 MG PO TABS
3.1250 mg | ORAL_TABLET | Freq: Two times a day (BID) | ORAL | Status: DC
  Administered 2014-01-02 – 2014-01-03 (×2): 3.125 mg via ORAL
  Filled 2014-01-02 (×4): qty 1

## 2014-01-02 MED ORDER — AMIODARONE HCL 200 MG PO TABS
400.0000 mg | ORAL_TABLET | Freq: Every day | ORAL | Status: DC
  Administered 2014-01-02 – 2014-01-07 (×6): 400 mg via ORAL
  Filled 2014-01-02 (×6): qty 2

## 2014-01-02 MED ORDER — SODIUM CHLORIDE 0.9 % IV SOLN
INTRAVENOUS | Status: DC
  Administered 2014-01-01 – 2014-01-04 (×2): via INTRAVENOUS

## 2014-01-02 NOTE — Progress Notes (Signed)
Pt with short burst of SVT, followed by PVC's, then another burst of SVT.  Asymptomatic, VSS, currently in NSR, rate in the 90s.  Dr. Thereasa Solo notified, no new orders given, continuing to monitor.

## 2014-01-02 NOTE — Progress Notes (Signed)
    Subjective:  Denies CP or dyspnea   Objective:  Filed Vitals:   01/02/14 0752 01/02/14 0800 01/02/14 0900 01/02/14 1000  BP: 123/97 116/65 124/74 116/92  Pulse: 95     Temp: 98.6 F (37 C)     TempSrc: Oral     Resp: 17 15 15 16   Height:      Weight:      SpO2: 99%       Intake/Output from previous day:  Intake/Output Summary (Last 24 hours) at 01/02/14 1136 Last data filed at 01/02/14 0700  Gross per 24 hour  Intake 1078.1 ml  Output   1325 ml  Net -246.9 ml    Physical Exam: Physical exam: Well-developed frail in no acute distress.  Skin is warm and dry.  HEENT is normal. Patient is blind Neck is supple.  Chest is clear to auscultation with normal expansion.  Cardiovascular exam is tachycardic and regular Abdominal exam nontender or distended. S/P colostomy Extremities show no edema. Severe deformities of upper extremities bilaterally neuro grossly intact    Lab Results: Basic Metabolic Panel:  Recent Labs  12/31/13 1116 01/01/14 0130 01/02/14 0250  NA 142 141 141  K 4.5 4.1 4.3  CL 107 102 103  CO2 20 25 24   GLUCOSE 94 127* 119*  BUN 17 17 16   CREATININE 1.00 1.02 1.15*  CALCIUM 8.6 8.6 8.7  MG 1.8  --   --    CBC:  Recent Labs  12/31/13 1116 01/01/14 0300 01/02/14 0250  WBC 6.3 5.6 6.1  NEUTROABS 4.8  --   --   HGB 9.1* 9.4* 9.5*  HCT 38.2 28.9* 30.0*  MCV 129.5* 93.8 93.5  PLT 227 256 289   Cardiac Enzymes:  Recent Labs  12/31/13 1950 01/01/14 0130 01/01/14 0641  TROPONINI <0.30 <0.30 <0.30     Assessment/Plan:  1 atrial flutter- converted to sinus rhythm overnight on amiodarone spontaneously. She was started on Eliquis. Will likely not need to purse TEE/ablation if she can be controlled with medical therapy. Would switch to po amiodarone 400 mg daily today. 2 acute systolic congestive CHF- there is a new cardiomyopathy, EF 20% with global hypokinesis. This may be tachycardia-mediated. She is able to lay flat without  dyspnea - BNP is elevated. Start low dose lasix 20 mg daily. Creatinine is trending up some - GFR is 60.  Consider adding ACE-I or ARB before d/c if creatinine stable. Add low dose carvedilol 3.125 mg BID.  Troponins have been negative, no chest pain - however, will ultimately need an ischemia work-up - RA is associated with 5x risk of CAD. 3 Chronic normocytic anemia-Evaluation per primary care. 4 rheumatoid arthritis 5 history of Marfan's  Pixie Casino, MD, [facility] Attending Cardiologist [facility]   HILTY,Kenneth C 01/02/2014, 11:36 AM

## 2014-01-02 NOTE — Progress Notes (Signed)
[facility]  [name] NWG:[id number] DOB: Oct 13, 1955 DOA: 12/31/2013 PCP: [name], MD  Brief narrative: 58 year old female patient with a history of Marfan syndrome, rheumatoid arthritis on chronic immunosuppression, and hypertension. Patient endorsed one month of progressive shortness of breath with palpitations which worsened 48 hours prior to presentation. Because of these symptoms she presented to her primary care physician for evaluation and was started on metoprolol for possible junctional tachycardia. Because her symptoms worsened she presented to the emergency department.  In the ER she was noted to be tachycardic and rhythm was consistent with atrial fibrillation with RVR. Cardiology was consulted. Troponin was normal, hemoglobin was 9.1 which was down somewhat from previous reading of 11. Chest x-ray demonstrated cardiomegaly with vascular congestion and likely interstitial edema. Echo from 2014 revealed preserved LV function with an EF of 55%.  HPI/Subjective: Resting comfortably.  Denies cp, sob, n/v, or abdom pain.    Assessment/Plan:  Atrial flutter Suffered hypotension with Cardizem and Lopressor so now utilizing amiodarone - Apixaban initiated - EP plans to proceed with TEE guided flutter ablation on Monday, and suggest being conservative with rate control in the mean time   Acute severe systolic CHF  Likely mediated by tachycardia - TTE 2014 with preserved LV function - to have f/u TTE in outpt f/u once rhythm corrected/controlled   Pulmonary HTN Mild - med tx as soon as BP can tolerate     Hx of HYPERTENSION -BP soft secondary to tachycardia - holding home medications for now  Acute renal failure GFR normal in April 2015 - suspect acute decline related to acute perfusion abnormalities from tachycardia - avoid ACE/ARB and NSAIDs - minimize contrast exposure - follow   Rheumatoid arthritis On chronic weekly methotrexate  with isoniazid and vitamin B 6  Marfan's syndrome TTE in 2014 with normal aortic root and CT scan same year also without any evidence of aortic aneurysm  Hx of perforated diverticulitis s/p laparotomy/Hartmann procedure June 2014 Followed as outpt by [facility]   Iron deficiency anemia Continue Aranesp - Hemoglobin stable  BLINDNESS  GERD  OSTEOPOROSIS History of T10 compression fracture noted on November 2014 CT scan  DVT prophylaxis: Apixaban Code Status: Full Family Communication: No family at bedside Disposition Plan/Expected LOS: Stepdown on amio gtt  Consultants: Cardiology EP  Procedures: 7/10 TTE - EF 20% - diffuse hypokinesis - PA pressure 52mm Hg  Cultures:  None  Antibiotics: None  Objective: Blood pressure 123/97, pulse 95, temperature 98.6 F (37 C), temperature source Oral, resp. rate 17, height 5\' 8"  (1.727 m), weight 61.689 kg (136 lb), last menstrual period 06/25/2006, SpO2 99.00%.  Intake/Output Summary (Last 24 hours) at 01/02/14 0918 Last data filed at 01/02/14 0700  Gross per 24 hour  Intake 1078.1 ml  Output   1325 ml  Net -246.9 ml   Exam: General: No acute respiratory distress Lungs: Clear to auscultation bilaterally without wheezes or crackles - pectus carinatum Cardiovascular: Regular rate - rhythm consistent with atrial flutter - without murmur gallop or rub, no peripheral edema Abdomen: Nontender, nondistended, soft, bowel sounds positive, no rebound, no ascites, no appreciable mass Musculoskeletal: No significant cyanosis, clubbing of bilateral lower extremities   Scheduled Meds:  Scheduled Meds: . allopurinol  100 mg Oral Daily  . apixaban  5 mg Oral BID  . docusate sodium  100 mg Oral BID  . folic acid  1 mg Oral Daily  . isoniazid  300 mg Oral Daily  .  ketoconazole  1 application Topical BID  . loratadine  10 mg Oral Daily  . megestrol  800 mg Oral BID AC  . [START ON 01/04/2014] methotrexate  20 mg Subcutaneous Weekly  .  pantoprazole  40 mg Oral Daily  . pyridOXINE  50 mg Oral Daily  . vitamin C  500 mg Oral Daily   Data Reviewed: Basic Metabolic Panel:  Recent Labs Lab 12/31/13 1116 01/01/14 0130 01/02/14 0250  NA 142 141 141  K 4.5 4.1 4.3  CL 107 102 103  CO2 20 25 24   GLUCOSE 94 127* 119*  BUN 17 17 16   CREATININE 1.00 1.02 1.15*  CALCIUM 8.6 8.6 8.7  MG 1.8  --   --    Liver Function Tests:  Recent Labs Lab 01/02/14 0250  AST 24  ALT 36*  ALKPHOS 89  BILITOT 0.4  PROT 6.7  ALBUMIN 3.2*   CBC:  Recent Labs Lab 12/31/13 1116 01/01/14 0300 01/02/14 0250  WBC 6.3 5.6 6.1  NEUTROABS 4.8  --   --   HGB 9.1* 9.4* 9.5*  HCT 38.2 28.9* 30.0*  MCV 129.5* 93.8 93.5  PLT 227 256 289   Cardiac Enzymes:  Recent Labs Lab 12/31/13 1116 12/31/13 1950 01/01/14 0130 01/01/14 0641  TROPONINI <0.30 <0.30 <0.30 <0.30   BNP (last 3 results)  Recent Labs  12/31/13 1116  PROBNP 6068.0*    Recent Results (from the past 240 hour(s))  MRSA PCR SCREENING     Status: None   Collection Time    12/31/13  4:25 PM      Result Value Ref Range Status   MRSA by PCR NEGATIVE  NEGATIVE Final   Comment:            The GeneXpert MRSA Assay (FDA     approved for NASAL specimens     only), is one component of a     comprehensive MRSA colonization     surveillance program. It is not     intended to diagnose MRSA     infection nor to guide or     monitor treatment for     MRSA infections.     Studies:  Recent x-ray studies have been reviewed in detail by the Attending Physician  Time spent :  50 mins  Cherene Altes, MD [facility] For Consults/Admissions - Flow Manager - [phone] Office  [phone] Pager [phone]  On-Call/Text Page:      [email or web address]      password [facility]  01/02/2014, 9:18 AM   LOS: 2 days

## 2014-01-03 DIAGNOSIS — N179 Acute kidney failure, unspecified: Secondary | ICD-10-CM

## 2014-01-03 DIAGNOSIS — R000 Tachycardia, unspecified: Secondary | ICD-10-CM

## 2014-01-03 MED ORDER — CARVEDILOL 6.25 MG PO TABS
6.2500 mg | ORAL_TABLET | Freq: Two times a day (BID) | ORAL | Status: DC
  Administered 2014-01-03 – 2014-01-07 (×7): 6.25 mg via ORAL
  Filled 2014-01-03 (×10): qty 1

## 2014-01-03 NOTE — Discharge Instructions (Addendum)
Information on my medicine - ELIQUIS (apixaban)  This medication education was reviewed with me or my healthcare representative as part of my discharge preparation.  The pharmacist that spoke with me during my hospital stay was:  Arty Baumgartner, [facility]  Why was Eliquis prescribed for you? Eliquis was prescribed for you to reduce the risk of forming blood clots that can cause a stroke if you have a medical condition called atrial fibrillation (a type of irregular heartbeat) OR to reduce the risk of a blood clots forming after orthopedic surgery.  What do You need to know about Eliquis ? Take your Eliquis 5 mg TWICE DAILY - one tablet in the morning and one tablet in the evening with or without food.  It would be best to take the doses about the same time each day.  If you have difficulty swallowing the tablet whole please discuss with your pharmacist how to take the medication safely.  Take Eliquis exactly as prescribed by your doctor and DO NOT stop taking Eliquis without talking to the doctor who prescribed the medication.  Stopping may increase your risk of developing a new clot or stroke.  Refill your prescription before you run out.  After discharge, you should have regular check-up appointments with your healthcare provider that is prescribing your Eliquis.  In the future your dose may need to be changed if your kidney function or weight changes by a significant amount or as you get older.  What do you do if you miss a dose? If you miss a dose, take it as soon as you remember on the same day and resume taking twice daily.  Do not take more than one dose of ELIQUIS at the same time.  Important Safety Information A possible side effect of Eliquis is bleeding. You should call your healthcare provider right away if you experience any of the following:   Bleeding from an injury or your nose that does not stop.   Unusual colored urine (red or dark brown) or unusual colored stools  (red or black).   Unusual bruising for unknown reasons.   A serious fall or if you hit your head (even if there is no bleeding).  Some medicines may interact with Eliquis and might increase your risk of bleeding or clotting while on Eliquis. To help avoid this, consult your healthcare provider or pharmacist prior to using any new prescription or non-prescription medications, including herbals, vitamins, non-steroidal anti-inflammatory drugs (NSAIDs) and supplements.  This website has more information on Eliquis (apixaban): [email or web address].  Daily Weight Record It is important to weigh yourself daily. Keep this daily weight chart near your scale. Weigh yourself each morning at the same time. Weigh yourself without shoes and with the same amount of clothes each day. Compare today's weight to yesterday's weight. Bring this form with you to your follow-up appointments. Call your caregiver if you gain 03 lb/1.4 kg in 1 day. Call your caregiver if you gain 05 lb/2.3 kg in a week. Date: ________ Weight: ____________________ Date: ________ Weight: ____________________ Date: ________ Weight: ____________________ Date: ________ Weight: ____________________ Date: ________ Weight: ____________________ Date: ________ Weight: ____________________ Date: ________ Weight: ____________________ Date: ________ Weight: ____________________ Date: ________ Weight: ____________________ Date: ________ Weight: ____________________ Date: ________ Weight: ____________________ Date: ________ Weight: ____________________ Date: ________ Weight: ____________________ Date: ________ Weight: ____________________ Date: ________ Weight: ____________________ Date: ________ Weight: ____________________ Date: ________ Weight: ____________________ Date: ________ Weight: ____________________ Date: ________ Weight: ____________________ Date: ________ Weight: ____________________ Date: ________ Weight: ____________________ Date:  ________ Weight: ____________________ Date: ________ Weight: ____________________ Date: ________ Weight: ____________________ Date: ________ Weight: ____________________ Date: ________ Weight: ____________________ Date: ________ Weight: ____________________ Date: ________ Weight: ____________________ Date: ________ Weight: ____________________ Date: ________ Weight: ____________________ Date: ________ Weight: ____________________ Date: ________ Weight: ____________________ Date: ________ Weight: ____________________ Date: ________ Weight: ____________________ Date: ________ Weight: ____________________ Date: ________ Weight: ____________________ Date: ________ Weight: ____________________ Date: ________ Weight: ____________________ Date: ________ Weight: ____________________ Date: ________ Weight: ____________________ Date: ________ Weight: ____________________ Date: ________ Weight: ____________________ Date: ________ Weight: ____________________ Date: ________ Weight: ____________________ Date: ________ Weight: ____________________ Date: ________ Weight: ____________________ Date: ________ Weight: ____________________ Date: ________ Weight: ____________________ Date: ________ Weight: ____________________ Date: ________ Weight: ____________________ Document Released: 08/23/2006 Document Revised: 09/03/2011 Document Reviewed: 05/30/2007 [employer] Patient Information 2015 [employer]. This information is not intended to replace advice given to you by your health care provider. Make sure you discuss any questions you have with your health care provider. Furosemide tablets What is this medicine? FUROSEMIDE (fyoor [place] se mide) is a diuretic. It helps you make more urine and to lose salt and excess water from your body. This medicine is used to treat high blood pressure, and edema or swelling from heart, kidney, or liver disease. This medicine may be used for other purposes; ask your health care  provider or pharmacist if you have questions. COMMON BRAND NAME(S): Delone, Lasix What should I tell my health care provider before I take this medicine? They need to know if you have any of these conditions: -abnormal blood electrolytes -diarrhea or vomiting -gout -heart disease -kidney disease, small amounts of urine, or difficulty passing urine -liver disease -an unusual or allergic reaction to furosemide, sulfa drugs, other medicines, foods, dyes, or preservatives -pregnant or trying to get pregnant -breast-feeding How should I use this medicine? Take this medicine by mouth with a glass of water. Follow the directions on the prescription label. You may take this medicine with or without food. If it upsets your stomach, take it with food or milk. Do not take your medicine more often than directed. Remember that you will need to pass more urine after taking this medicine. Do not take your medicine at a time of day that will cause you problems. Do not take at bedtime. Talk to your pediatrician regarding the use of this medicine in children. While this drug may be prescribed for selected conditions, precautions do apply. Overdosage: If you think you have taken too much of this medicine contact a poison control center or emergency room at once. NOTE: This medicine is only for you. Do not share this medicine with others. What if I miss a dose? If you miss a dose, take it as soon as you can. If it is almost time for your next dose, take only that dose. Do not take double or extra doses. What may interact with this medicine? -aspirin and aspirin-like medicines -certain antibiotics -chloral hydrate -cisplatin -cyclosporine -digoxin -diuretics -laxatives -lithium -medicines for blood pressure -medicines that relax muscles for surgery -methotrexate -NSAIDs, medicines for pain and inflammation like ibuprofen, naproxen, or indomethacin -phenytoin -steroid medicines like prednisone or  cortisone -sucralfate This list may not describe all possible interactions. Give your health care provider a list of all the medicines, herbs, non-prescription drugs, or dietary supplements you use. Also tell them if you smoke, drink alcohol, or use illegal drugs. Some items may interact with your medicine. What should I watch for while using this medicine? Visit your doctor or health care professional for regular checks on  your progress. Check your blood pressure regularly. Ask your doctor or health care professional what your blood pressure should be, and when you should contact him or her. If you are a diabetic, check your blood sugar as directed. You may need to be on a special diet while taking this medicine. Check with your doctor. Also, ask how many glasses of fluid you need to drink a day. You must not get dehydrated. You may get drowsy or dizzy. Do not drive, use machinery, or do anything that needs mental alertness until you know how this drug affects you. Do not stand or sit up quickly, especially if you are an older patient. This reduces the risk of dizzy or fainting spells. Alcohol can make you more drowsy and dizzy. Avoid alcoholic drinks. This medicine can make you more sensitive to the sun. Keep out of the sun. If you cannot avoid being in the sun, wear protective clothing and use sunscreen. Do not use sun lamps or tanning beds/booths. What side effects may I notice from receiving this medicine? Side effects that you should report to your doctor or health care professional as soon as possible: -blood in urine or stools -dry mouth -fever or chills -hearing loss or ringing in the ears -irregular heartbeat -muscle pain or weakness, cramps -skin rash -stomach upset, pain, or nausea -tingling or numbness in the hands or feet -unusually weak or tired -vomiting or diarrhea -yellowing of the eyes or skin Side effects that usually do not require medical attention (report to your doctor  or health care professional if they continue or are bothersome): -headache -loss of appetite -unusual bleeding or bruising This list may not describe all possible side effects. Call your doctor for medical advice about side effects. You may report side effects to FDA at 1-800-FDA-1088. Where should I keep my medicine? Keep out of the reach of children. Store at room temperature between 15 and 30 degrees C (59 and 86 degrees F). Protect from light. Throw away any unused medicine after the expiration date. NOTE: This sheet is a summary. It may not cover all possible information. If you have questions about this medicine, talk to your doctor, pharmacist, or health care provider.  2015, [employer]vier/[employer] Standard. (2009-05-30 16:24:50) Atrial Fibrillation Atrial fibrillation is a type of irregular heart rhythm (arrhythmia). During atrial fibrillation, the upper chambers of the heart (atria) quiver continuously in a chaotic pattern. This causes an irregular and often rapid heart rate.  Atrial fibrillation is the result of the heart becoming overloaded with disorganized signals that tell it to beat. These signals are normally released one at a time by a part of the right atrium called the sinoatrial node. They then travel from the atria to the lower chambers of the heart (ventricles), causing the atria and ventricles to contract and pump blood as they pass. In atrial fibrillation, parts of the atria outside of the sinoatrial node also release these signals. This results in two problems. First, the atria receive so many signals that they do not have time to fully contract. Second, the ventricles, which can only receive one signal at a time, beat irregularly and out of rhythm with the atria.  There are three types of atrial fibrillation:   Paroxysmal. Paroxysmal atrial fibrillation starts suddenly and stops on its own within a week.  Persistent. Persistent atrial fibrillation lasts for more than a week. It may  stop on its own or with treatment.  Permanent. Permanent atrial fibrillation does not go away. Episodes  of atrial fibrillation may lead to permanent atrial fibrillation. Atrial fibrillation can prevent your heart from pumping blood normally. It increases your risk of stroke and can lead to heart failure.  CAUSES   Heart conditions, including a heart attack, heart failure, coronary artery disease, and heart valve conditions.   Inflammation of the sac that surrounds the heart (pericarditis).  Blockage of an artery in the lungs (pulmonary embolism).  Pneumonia or other infections.  Chronic lung disease.  Thyroid problems, especially if the thyroid is overactive (hyperthyroidism).  Caffeine, excessive alcohol use, and use of some illegal drugs.   Use of some medicines, including certain decongestants and diet pills.  Heart surgery.   Birth defects.  Sometimes, no cause can be found. When this happens, the atrial fibrillation is called lone atrial fibrillation. The risk of complications from atrial fibrillation increases if you have lone atrial fibrillation and you are age 2 years or older. RISK FACTORS  Heart failure.  Coronary artery disease.  Diabetes mellitus.   High blood pressure (hypertension).   Obesity.   Other arrhythmias.   Increased age. SYMPTOMS   A feeling that your heart is beating rapidly or irregularly.   A feeling of discomfort or pain in your chest.   Shortness of breath.   Sudden light-headedness or weakness.   Getting tired easily when exercising.   Urinating more often than normal (mainly when atrial fibrillation first begins).  In paroxysmal atrial fibrillation, symptoms may start and suddenly stop. DIAGNOSIS  Your health care provider may be able to detect atrial fibrillation when taking your pulse. Your health care provider may have you take a test called an ambulatory electrocardiogram (ECG). An ECG records your heartbeat  patterns over a 24-hour period. You may also have other tests, such as:  Transthoracic echocardiogram (TTE). During echocardiography, sound waves are used to evaluate how blood flows through your heart.  Transesophageal echocardiogram (TEE).  Stress test. There is more than one type of stress test. If a stress test is needed, ask your health care provider about which type is best for you.   Chest X-ray exam.  Blood tests.  Computed tomography (CT). TREATMENT  Treatment may include:  Treating any underlying conditions. For example, if you have an overactive thyroid, treating the condition may correct atrial fibrillation.  Taking medicine. Medicines may be given to control a rapid heart rate or to prevent blood clots, heart failure, or a stroke.  Having a procedure to correct the rhythm of the heart:  Electrical cardioversion. During electrical cardioversion, a controlled, low-energy shock is delivered to the heart through your skin. If you have chest pain, very low blood pressure, or sudden heart failure, this procedure may need to be done as an emergency.  Catheter ablation. During this procedure, heart tissues that send the signals that cause atrial fibrillation are destroyed.  Maze or minimaze procedure. During this surgery, thin lines of heart tissue that carry the abnormal signals are destroyed. The maze procedure is an open-heart surgery. The minimaze procedure is a minimally invasive surgery. This means that small cuts are made to access the heart instead of a large opening.  Pulmonary venous isolation. During this surgery, tissue around the veins that carry blood from the lungs (pulmonary veins) is destroyed. This tissue is thought to carry the abnormal signals. HOME CARE INSTRUCTIONS   Only take medicines that your health care provider approves, and take them as directed. Some medicines can make atrial fibrillation worse or recur.  If blood  thinners were prescribed by your  health care provider, take them exactly as directed. Too much blood-thinning medicine can cause bleeding. If you take too little, you will not have the needed protection against stroke and other problems.  Perform blood tests at home if directed by your health care provider. Perform blood tests exactly as directed.  Quit smoking if you smoke.  Do not drink alcohol.  Do not drink caffeinated beverages such as coffee, soda, and some teas. You may drink decaffeinated coffee, soda, or tea.   Maintain a healthy weight.Do not use diet pills unless your health care provider approves. They may make heart problems worse.   Follow diet instructions as directed by your health care provider.  Exercise regularly as directed by your health care provider.  Keep all follow-up appointments with your health care provider. PREVENTION  The following substances can cause atrial fibrillation to recur:   Caffeinated beverages.  Alcohol.  Certain medicines, especially those used for breathing problems.  Certain herbs and herbal medicines, such as those containing ephedra or ginseng.  Illegal drugs, such as cocaine and amphetamines. Sometimes medicines are given to prevent atrial fibrillation from recurring. Proper treatment of any underlying condition is also important in helping prevent recurrence.  SEEK MEDICAL CARE IF:  You notice a change in the rate, rhythm, or strength of your heartbeat.  You suddenly begin urinating more frequently.  You tire more easily when exerting yourself or exercising. SEEK IMMEDIATE MEDICAL CARE IF:   You have chest pain, abdominal pain, sweating, or weakness.  You feel nauseous.  You have shortness of breath.  You suddenly have swollen feet and ankles.  You feel dizzy.  Your face or limbs feel numb or weak.  You have a change in your vision or speech. MAKE SURE YOU:   Understand these instructions.  Will watch your condition.  Will get help right  away if you are not doing well or get worse. Document Released: 06/11/2005 Document Revised: 06/16/2013 Document Reviewed: 07/22/2012 [facility] Patient Information 2015 [place], [place]. This information is not intended to replace advice given to you by your health care provider. Make sure you discuss any questions you have with your health care provider. Cardiac Ablation Cardiac ablation is a procedure to disable a small amount of heart tissue in very specific places. The heart has many electrical connections. Sometimes these connections are abnormal and can cause the heart to beat very fast or irregularly. By disabling some of the problem areas, heart rhythm can be improved or made normal. Ablation is done for people who:   Have Wolff-Parkinson-White syndrome.   Have other fast heart rhythms (tachycardia).   Have taken medicines for an abnormal heart rhythm (arrhythmia) that resulted in:   No success.   Side effects.   May have a high-risk heartbeat that could result in death.  LET [facility] CARE PROVIDER KNOW ABOUT:   Any allergies you have or any previous reactions you have had to X-ray dye, food (such as seafood), medicine, or tape.   All medicines you are taking, including vitamins, herbs, eye drops, creams, and over-the-counter medicines.   Previous problems you or members of your family have had with the use of anesthetics.   Any blood disorders you have.   Previous surgeries or procedures (such as a kidney transplant) you have had.   Medical conditions you have (such as kidney failure).  RISKS AND COMPLICATIONS Generally, cardiac ablation is a safe procedure. However, as with any procedure, complications  can occur. Possible risks and complications include:   Increased risk of cancer. Depending on how long it takes to do the ablation, the dose of radiation can be high.  Bruising and bleeding where a thin, flexible tube (catheter) was inserted during the  procedure.   Bleeding into the chest, especially into the sac that surrounds the heart (serious).  Need for a permanent pacemaker if the normal electrical system is damaged.   The procedure may not be fully effective, and this may not be recognized for months. Repeat ablation procedures are sometimes required. BEFORE THE PROCEDURE   Follow any instructions from your health care provider regarding eating and drinking before the procedure.   Take your medicines as directed at regular times with water, unless instructed otherwise by your health care provider. If you are taking diabetes medicine, including insulin, ask how you are to take it and if there are any special instructions you should follow. It is common to adjust insulin dosing the day of the ablation.  PROCEDURE  An ablation is usually performed in a catheterization laboratory with the guidance of fluoroscopy. Fluoroscopy is a type of X-ray that helps your health care provider see images of your heart during the procedure.   An ablation is a minimally invasive procedure. This means a small cut (incision) is made in either your neck or groin. Your health care provider will decide where to make the incision based on your medical history and physical exam.  An IV tube will be started before the procedure begins. You will be given an anesthetic or medicine to help you relax (sedative).  The skin on your neck or groin will be numbed. A needle will be inserted into a large vein in your neck or groin and catheters will be threaded to your heart.  A special dye that shows up on fluoroscopy pictures may be injected through the catheter. The dye helps your health care provider see the area of the heart that needs treatment.  The catheter has electrodes on the tip. When the area of heart tissue that is causing the arrhythmia is found, the catheter tip will send an electrical current to the area and "scar" the tissue. Three types of energy  can be used to ablate the heart tissue:   Heat (radiofrequency energy).   Laser energy.   Extreme cold (cryoablation).   When the area of the heart has been ablated, the catheter will be taken out. Pressure will be held on the insertion site. This will help the insertion site clot and keep it from bleeding. A bandage will be placed on the insertion site.   An ablation procedure can take 1-6 hours to complete. AFTER THE PROCEDURE   After the procedure, you will be taken to a recovery area where your vital signs (blood pressure, heart rate, and breathing) will be monitored. The insertion site will also be monitored for bleeding.   You will need to lie still for 4-6 hours. This is to ensure you do not bleed from the catheter insertion site.   If your blood pressure and heart rate are stable and no bleeding occurs at the insertion site, you may go home the same day as your procedure.   If complications occur or your health care provider feels you should be watched, you may need to stay in the hospital overnight.  Document Released: 10/28/2008 Document Revised: 02/11/2013 Document Reviewed: 11/03/2012 [facility] Patient Information 2015 [place], [place]. This information is not intended to replace  advice given to you by your health care provider. Make sure you discuss any questions you have with your health care provider. Cardiomyopathy Cardiomyopathy means a disease of the heart muscle. The heart muscle becomes enlarged or stiff. The heart is not able to pump enough blood or deliver enough oxygen to the body. This leads to heart failure and is the number one reason for heart transplants.  TYPES OF CARDIOMYOPATHY INCLUDE: DILATED  The most common type. The heart muscle is stretched out and weak so there is less blood pumped out.   Some causes:  Disease of the arteries of the heart (ischemia).  Heart attack with muscle scar.  Leaky or damaged valves.  After a viral  illness.  Smoking.  High cholesterol.  Diabetes or overactive thyroid.  Alcohol or drug abuse.  High blood pressure.  May be reversible. HYPERTROPHIC The heart muscle grows bigger so there is less room for blood in the ventricle, and not enough blood is pumped out.   Causes include:  Mitral valve leaks.  Inherited tendency (from your family).  No explanation (idiopathic).  May be a cause of sudden death in young athletes with no symptoms. RESTRICTIVE The heart muscle becomes stiff, but not always larger. The heart has to work harder and will get weaker. Abnormal heart beats or rhythm (arrhythmia) are common.  Some causes:  Diseases in other parts of the body which may produce abnormal deposits in the heart muscle.  Probably not inherited.  A result of radiation treatment for cancer. SYMPTOMS OF ALL TYPES:  Less able to exercise or tolerate physical activity.  Palpitations.  Irregular heart beat, heart arrhythmias.  Shortness of breath, even at rest.  Chest pain.  Lightheadedness or fainting. TREATMENT  Life-style changes including reducing salt, lowering cholesterol, stop smoking.  Manage contributing causes with medications.  Medicines to help reduce the fluids in the body.  An implanted cardioverter defibrillator (ICD) to improve heart function and correct arrhythmias.  Medications to relax the blood vessels and make it easier for the heart to pump.  Drugs that help regulate heart beat and improve heart relaxation, reducing the work of the heart.  Myomectomy for patients with hypertrophic cardiomyopathy and severe problems. This is a surgical procedure that removes a portion of the thickened muscle wall in order to improve heart output and provide symptom relief.  A heart transplant is an option in carefully applied circumstances. SEEK IMMEDIATE MEDICAL CARE IF:   You have severe chest pain, especially if the pain is crushing or pressure-like and  spreads to the arms, back, neck, or jaw, or if you have sweating, feeling sick to your stomach (nausea), or shortness of breath. THIS IS AN EMERGENCY. Do not wait to see if the pain will go away. Get medical help at once. Call your local emergency services (911 in [place] DO NOT drive yourself to the hospital.  You develop severe shortness of breath.  You begin to cough up bloody sputum.  You are unable to sleep because you cannot breathe.  You gain weight due to fluid retention.  You develop painful swelling in your calf or leg.  You feel your heart racing and it does not go away or happens when you are resting. Document Released: 08/24/2004 Document Revised: 09/03/2011 Document Reviewed: 01/28/2008 [facility] Patient Information 2015 [place], [place]. This information is not intended to replace advice given to you by your health care provider. Make sure you discuss any questions you have with your health care provider.

## 2014-01-03 NOTE — Progress Notes (Addendum)
    Subjective:  Denies CP or dyspnea.  Apparent short burst of SVT overnight, patient was asymptomatic.  Objective:  Filed Vitals:   01/03/14 0400 01/03/14 0408 01/03/14 0700 01/03/14 0800  BP: 117/70  131/95 132/80  Pulse:  85    Temp:  98.4 F (36.9 C)  98.5 F (36.9 C)  TempSrc:  Oral  Oral  Resp: 15  15 12   Height:      Weight:    136 lb 7.4 oz (61.9 kg)  SpO2:  100%  98%    Intake/Output from previous day:  Intake/Output Summary (Last 24 hours) at 01/03/14 1046 Last data filed at 01/03/14 0900  Gross per 24 hour  Intake 974.53 ml  Output   1150 ml  Net -175.47 ml    Physical Exam: Physical exam: Well-developed frail in no acute distress.  Skin is warm and dry.  HEENT is normal. Patient is blind Neck is supple.  Chest is clear to auscultation with normal expansion.  Cardiovascular exam is tachycardic and regular Abdominal exam nontender or distended. S/P colostomy Extremities show no edema. Severe deformities of upper extremities bilaterally neuro grossly intact    Lab Results: Basic Metabolic Panel:  Recent Labs  12/31/13 1116 01/01/14 0130 01/02/14 0250  NA 142 141 141  K 4.5 4.1 4.3  CL 107 102 103  CO2 20 25 24   GLUCOSE 94 127* 119*  BUN 17 17 16   CREATININE 1.00 1.02 1.15*  CALCIUM 8.6 8.6 8.7  MG 1.8  --   --    CBC:  Recent Labs  12/31/13 1116 01/01/14 0300 01/02/14 0250  WBC 6.3 5.6 6.1  NEUTROABS 4.8  --   --   HGB 9.1* 9.4* 9.5*  HCT 38.2 28.9* 30.0*  MCV 129.5* 93.8 93.5  PLT 227 256 289   Cardiac Enzymes:  Recent Labs  12/31/13 1950 01/01/14 0130 01/01/14 0641  TROPONINI <0.30 <0.30 <0.30     Assessment/Plan:  1 atrial flutter- In sinus rhythm. She was started on Eliquis. Short bursts of SVT. On po amiodarone. Will not need TEE procedure. 2 acute systolic congestive CHF- there is a new cardiomyopathy, EF 20% with global hypokinesis. This may be tachycardia-mediated. She is able to lay flat without dyspnea - BNP  is elevated. Start low dose lasix 20 mg daily. Creatinine is trending up some - GFR is 60.  Consider adding ACE-I or ARB before d/c if creatinine stable. Increase carvedilol to 6.25 mg BID.  Troponins have been negative, no chest pain -will need an ischemia work-up - a lexiscan myoview may be sufficient. RA is associated with 5x risk of CAD.  Keep NPO p MN for myoview tomorrow. 3 Chronic normocytic anemia-Evaluation per primary care. 4 rheumatoid arthritis 5 history of Marfan's  Pixie Casino, MD, [facility] Attending Cardiologist [facility]   HILTY,Kenneth C 01/03/2014, 10:46 AM

## 2014-01-03 NOTE — Progress Notes (Signed)
[facility]  LECRETIA BUCZEK PZW:[id number] DOB: 03-Mar-1956 DOA: 12/31/2013 PCP: [name], MD  Brief narrative: 58 year old female patient with a history of Marfan syndrome, rheumatoid arthritis on chronic immunosuppression, and hypertension. Patient endorsed one month of progressive shortness of breath with palpitations which worsened 48 hours prior to presentation. Because of these symptoms she presented to her primary care physician for evaluation and was started on metoprolol for possible junctional tachycardia. Because her symptoms worsened she presented to the emergency department.  In the ER she was noted to be tachycardic and rhythm was consistent with atrial fibrillation with RVR. Cardiology was consulted. Troponin was normal, hemoglobin was 9.1 which was down somewhat from previous reading of 11. Chest x-ray demonstrated cardiomegaly with vascular congestion and likely interstitial edema. Echo from 2014 revealed preserved LV function with an EF of 55%.  HPI/Subjective: Resting comfortably.  Has no new complaints today.  Denies cp or sob.    Assessment/Plan:  Atrial flutter Suffered hypotension with Cardizem and Lopressor so utilizing amiodarone - Apixaban initiated - ablation no longer felt to be indicated as pt maintaining NSR on amio  Acute severe systolic CHF  Likely mediated by tachycardia - TTE 2014 with preserved LV function - to have f/u TTE in outpt f/u once rhythm corrected/controlled - for stress testing in AM to r/o ischemic etiology  Pulmonary HTN Mild - follow - consider eventually adding hydralazine if BP can tolerate     Hx of HYPERTENSION -BP improving w/ restoration of NSR - follow w/o change for now   Acute renal failure GFR normal in April 2015 - suspect acute decline related to acute perfusion abnormalities from tachycardia - avoid ACE/ARB for now and NSAIDs - minimize contrast exposure - follow   Rheumatoid  arthritis On chronic weekly methotrexate with isoniazid and vitamin B 6  Marfan's syndrome TTE in 2014 with normal aortic root and CT scan same year also without any evidence of aortic aneurysm  Hx of perforated diverticulitis s/p laparotomy / Jeanette Caprice procedure June 2014 Followed as outpt by [facility]   Chronic Iron deficiency anemia Hgb stable   BLINDNESS  GERD  OSTEOPOROSIS History of T10 compression fracture noted on November 2014 CT scan  DVT prophylaxis: Apixaban Code Status: Full Family Communication: No family at bedside Disposition Plan/Expected LOS: Stepdown  Consultants: Cardiology EP  Procedures: 7/10 TTE - EF 20% - diffuse hypokinesis - PA pressure 27mm Hg  Cultures:  None  Antibiotics: None  Objective: Blood pressure 132/80, pulse 85, temperature 98.5 F (36.9 C), temperature source Oral, resp. rate 12, height 5\' 8"  (1.727 m), weight 61.9 kg (136 lb 7.4 oz), last menstrual period 06/25/2006, SpO2 98.00%.  Intake/Output Summary (Last 24 hours) at 01/03/14 1138 Last data filed at 01/03/14 0900  Gross per 24 hour  Intake 947.83 ml  Output   1150 ml  Net -202.17 ml   Exam: General: No acute respiratory distress Lungs: Clear to auscultation bilaterally without wheezes or crackles - pectus carinatum Cardiovascular: Regular rate and rythm - without murmur gallop or rub, no peripheral edema Abdomen: Nontender, nondistended, soft, bowel sounds positive, no rebound, no ascites, no appreciable mass Musculoskeletal: No significant cyanosis, clubbing of bilateral lower extremities   Scheduled Meds:  Scheduled Meds: . allopurinol  100 mg Oral Daily  . amiodarone  400 mg Oral Daily  . apixaban  5 mg Oral BID  . carvedilol  6.25 mg Oral BID WC  . docusate sodium  100  mg Oral BID  . folic acid  1 mg Oral Daily  . furosemide  20 mg Oral Daily  . isoniazid  300 mg Oral Daily  . ketoconazole  1 application Topical BID  . loratadine  10 mg Oral Daily  .  megestrol  800 mg Oral BID AC  . [START ON 01/04/2014] methotrexate  20 mg Subcutaneous Weekly  . pantoprazole  40 mg Oral Daily  . pyridOXINE  50 mg Oral Daily  . vitamin C  500 mg Oral Daily   Data Reviewed: Basic Metabolic Panel:  Recent Labs Lab 12/31/13 1116 01/01/14 0130 01/02/14 0250  NA 142 141 141  K 4.5 4.1 4.3  CL 107 102 103  CO2 20 25 24   GLUCOSE 94 127* 119*  BUN 17 17 16   CREATININE 1.00 1.02 1.15*  CALCIUM 8.6 8.6 8.7  MG 1.8  --   --    Liver Function Tests:  Recent Labs Lab 01/02/14 0250  AST 24  ALT 36*  ALKPHOS 89  BILITOT 0.4  PROT 6.7  ALBUMIN 3.2*   CBC:  Recent Labs Lab 12/31/13 1116 01/01/14 0300 01/02/14 0250  WBC 6.3 5.6 6.1  NEUTROABS 4.8  --   --   HGB 9.1* 9.4* 9.5*  HCT 38.2 28.9* 30.0*  MCV 129.5* 93.8 93.5  PLT 227 256 289   Cardiac Enzymes:  Recent Labs Lab 12/31/13 1116 12/31/13 1950 01/01/14 0130 01/01/14 0641  TROPONINI <0.30 <0.30 <0.30 <0.30   BNP (last 3 results)  Recent Labs  12/31/13 1116  PROBNP 6068.0*    Recent Results (from the past 240 hour(s))  MRSA PCR SCREENING     Status: None   Collection Time    12/31/13  4:25 PM      Result Value Ref Range Status   MRSA by PCR NEGATIVE  NEGATIVE Final   Comment:            The GeneXpert MRSA Assay (FDA     approved for NASAL specimens     only), is one component of a     comprehensive MRSA colonization     surveillance program. It is not     intended to diagnose MRSA     infection nor to guide or     monitor treatment for     MRSA infections.     Studies:  Recent x-ray studies have been reviewed in detail by the Attending Physician  Time spent :  25 mins  Cherene Altes, MD [facility] For Consults/Admissions - Flow Manager - [phone] Office  [phone] Pager [phone]  On-Call/Text Page:      [email or web address]      password [facility]  01/03/2014, 11:38 AM   LOS: 3 days

## 2014-01-04 ENCOUNTER — Encounter (HOSPITAL_COMMUNITY)
Admission: EM | Disposition: A | Discharge status: Skilled Nursing Facility | Payer: Self-pay | Source: Home / Self Care | Attending: Internal Medicine

## 2014-01-04 ENCOUNTER — Encounter (HOSPITAL_COMMUNITY)
Admission: EM | Disposition: A | Discharge status: Skilled Nursing Facility | Payer: Medicare Other | Source: Home / Self Care | Attending: Internal Medicine

## 2014-01-04 ENCOUNTER — Ambulatory Visit (HOSPITAL_COMMUNITY): Admit: 2014-01-04 | Payer: Self-pay | Admitting: Internal Medicine

## 2014-01-04 DIAGNOSIS — I4892 Unspecified atrial flutter: Secondary | ICD-10-CM

## 2014-01-04 HISTORY — PX: ATRIAL FLUTTER ABLATION: SHX5733

## 2014-01-04 HISTORY — PX: ABLATION: SHX5711

## 2014-01-04 LAB — BASIC METABOLIC PANEL
Anion gap: 14 (ref 5–15)
BUN: 18 mg/dL (ref 6–23)
CO2: 25 mEq/L (ref 19–32)
Calcium: 9.1 mg/dL (ref 8.4–10.5)
Chloride: 101 mEq/L (ref 96–112)
Creatinine, Ser: 0.86 mg/dL (ref 0.50–1.10)
GFR calc Af Amer: 85 mL/min — ABNORMAL LOW (ref >90)
GFR calc non Af Amer: 74 mL/min — ABNORMAL LOW (ref >90)
Glucose, Bld: 104 mg/dL — ABNORMAL HIGH (ref 70–99)
Potassium: 4.1 mEq/L (ref 3.7–5.3)
Sodium: 140 mEq/L (ref 137–147)

## 2014-01-04 LAB — PRO B NATRIURETIC PEPTIDE: Pro B Natriuretic peptide (BNP): 3355 pg/mL — ABNORMAL HIGH (ref 0–125)

## 2014-01-04 SURGERY — ATRIAL FLUTTER ABLATION
Anesthesia: LOCAL

## 2014-01-04 SURGERY — ECHOCARDIOGRAM, TRANSESOPHAGEAL
Anesthesia: Moderate Sedation

## 2014-01-04 MED ORDER — ACETAMINOPHEN 325 MG PO TABS: 650.0000 mg | ORAL_TABLET | ORAL | Status: DC | PRN

## 2014-01-04 MED ORDER — FENTANYL CITRATE 0.05 MG/ML IJ SOLN
INTRAMUSCULAR | Status: AC
  Filled 2014-01-04: qty 2

## 2014-01-04 MED ORDER — MIDAZOLAM HCL 5 MG/5ML IJ SOLN
INTRAMUSCULAR | Status: AC
  Filled 2014-01-04: qty 5

## 2014-01-04 MED ORDER — SODIUM CHLORIDE 0.9 % IJ SOLN
3.0000 mL | Freq: Two times a day (BID) | INTRAMUSCULAR | Status: DC
  Administered 2014-01-05 – 2014-01-06 (×3): 3 mL via INTRAVENOUS

## 2014-01-04 MED ORDER — SODIUM CHLORIDE 0.9 % IV SOLN: 250.0000 mL | INTRAVENOUS | Status: DC | PRN

## 2014-01-04 MED ORDER — BUPIVACAINE HCL (PF) 0.25 % IJ SOLN
INTRAMUSCULAR | Status: AC
  Filled 2014-01-04: qty 60

## 2014-01-04 MED ORDER — SODIUM CHLORIDE 0.9 % IJ SOLN: 3.0000 mL | INTRAMUSCULAR | Status: DC | PRN

## 2014-01-04 MED ORDER — HEPARIN (PORCINE) IN NACL 2-0.9 UNIT/ML-% IJ SOLN
INTRAMUSCULAR | Status: AC
  Filled 2014-01-04: qty 500

## 2014-01-04 MED ORDER — ONDANSETRON HCL 4 MG/2ML IJ SOLN: 4.0000 mg | Freq: Four times a day (QID) | INTRAMUSCULAR | Status: DC | PRN

## 2014-01-04 NOTE — Progress Notes (Signed)
[facility]  BRENTLEE SCIARA [id number]:[id number] DOB: 1956-03-28 DOA: 12/31/2013 PCP: [name], MD  Brief narrative: 58 year old female patient with a history of Marfan syndrome, rheumatoid arthritis on chronic immunosuppression, and hypertension. Patient endorsed one month of progressive shortness of breath with palpitations which worsened 48 hours prior to presentation. Because of these symptoms she presented to her primary care physician for evaluation and was started on metoprolol for possible junctional tachycardia. Because her symptoms worsened she presented to the emergency department.  In the ER she was noted to be tachycardic and rhythm was consistent with atrial fibrillation with RVR. Cardiology was consulted. Troponin was normal, hemoglobin was 9.1 which was down somewhat from previous reading of 11. Chest x-ray demonstrated cardiomegaly with vascular congestion and likely interstitial edema. Echo from 2014 revealed preserved LV function with an EF of 55%.  HPI/Subjective: Alert - no physical complaints - verbalized frustration over conflicting recommendations for treatment  Assessment/Plan:  Atrial flutter Suffered hypotension with Cardizem and Lopressor so utilizing short term amiodarone - Apixaban initiated - rounding Gen Cardiologist suggested ablation no longer indicated as pt maintaining NSR on Amio but EP MD documented that likely flutter will recur even on meds and recommends ablation - d/w pt and explained rationale - I would not favor the long term combination of Amiodarone with Methotrexate, and therefore favor ablation as suggested per EP   Acute severe systolic CHF  EF depressed per ECHO this admit (20%) - Likely mediated by tachycardia - TTE 2014 with preserved LV function - to have f/u TTE in outpt f/u once rhythm corrected/controlled - if CM persists then should pursue OP stress testing to r/o ischemic etiology  Pulmonary HTN Mild -  follow - consider eventually adding hydralazine if BP can tolerate     Hx of HYPERTENSION BP improving w/ restoration of NSR - follow w/o change for now   Acute renal failure GFR normal in April 2015 - suspect acute decline related to acute perfusion abnormalities from tachycardia - avoid ACE/ARB for now and NSAIDs - minimize contrast exposure - follow   Rheumatoid arthritis On chronic weekly methotrexate  Latent TB x 30 years Started on isoniazid and vitamin B 6 by OP MD once Methotrexate started  Marfan's syndrome TTE in 2014 with normal aortic root and CT scan same year also without any evidence of aortic aneurysm  Hx of perforated diverticulitis s/p laparotomy / Jeanette Caprice procedure June 2014 Followed as outpt by [facility]   Chronic Iron deficiency anemia Hgb stable   BLINDNESS  GERD  OSTEOPOROSIS History of T10 compression fracture noted on November 2014 CT scan  DVT prophylaxis: Apixaban Code Status: Full Family Communication: No family at bedside Disposition Plan/Expected LOS: Stepdown  Consultants: Cardiology EP  Procedures: 7/10 TTE - EF 20% - diffuse hypokinesis - PA pressure 68mm Hg  Cultures:  None  Antibiotics: None  Objective: Blood pressure 122/87, pulse 80, temperature 98.6 F (37 C), temperature source Oral, resp. rate 15, height 5\' 8"  (1.727 m), weight 136 lb 7.4 oz (61.9 kg), last menstrual period 06/25/2006, SpO2 98.00%.  Intake/Output Summary (Last 24 hours) at 01/04/14 1314 Last data filed at 01/04/14 1004  Gross per 24 hour  Intake    400 ml  Output   1900 ml  Net  -1500 ml   Exam: General: No acute respiratory distress Lungs: Clear to auscultation bilaterally without wheezes or crackles - pectus carinatum Cardiovascular: Regular rate and rythm, no peripheral edema Abdomen:  Nontender, nondistended, soft, bowel sounds positive, no rebound, no ascites, no appreciable mass Musculoskeletal: No significant cyanosis, clubbing of bilateral lower  extremities-has marked rheumatoid deformities both hands   Scheduled Meds:  Scheduled Meds: . allopurinol  100 mg Oral Daily  . amiodarone  400 mg Oral Daily  . apixaban  5 mg Oral BID  . carvedilol  6.25 mg Oral BID WC  . docusate sodium  100 mg Oral BID  . folic acid  1 mg Oral Daily  . furosemide  20 mg Oral Daily  . isoniazid  300 mg Oral Daily  . ketoconazole  1 application Topical BID  . loratadine  10 mg Oral Daily  . megestrol  800 mg Oral BID AC  . methotrexate  20 mg Subcutaneous Weekly  . pantoprazole  40 mg Oral Daily  . pyridOXINE  50 mg Oral Daily  . vitamin C  500 mg Oral Daily   Data Reviewed: Basic Metabolic Panel:  Recent Labs Lab 12/31/13 1116 01/01/14 0130 01/02/14 0250 01/04/14 0248  NA 142 141 141 140  K 4.5 4.1 4.3 4.1  CL 107 102 103 101  CO2 20 25 24 25   GLUCOSE 94 127* 119* 104*  BUN 17 17 16 18   CREATININE 1.00 1.02 1.15* 0.86  CALCIUM 8.6 8.6 8.7 9.1  MG 1.8  --   --   --    Liver Function Tests:  Recent Labs Lab 01/02/14 0250  AST 24  ALT 36*  ALKPHOS 89  BILITOT 0.4  PROT 6.7  ALBUMIN 3.2*   CBC:  Recent Labs Lab 12/31/13 1116 01/01/14 0300 01/02/14 0250  WBC 6.3 5.6 6.1  NEUTROABS 4.8  --   --   HGB 9.1* 9.4* 9.5*  HCT 38.2 28.9* 30.0*  MCV 129.5* 93.8 93.5  PLT 227 256 289   Cardiac Enzymes:  Recent Labs Lab 12/31/13 1116 12/31/13 1950 01/01/14 0130 01/01/14 0641  TROPONINI <0.30 <0.30 <0.30 <0.30   BNP (last 3 results)  Recent Labs  12/31/13 1116 01/04/14 0245  PROBNP 6068.0* 3355.0*    Recent Results (from the past 240 hour(s))  MRSA PCR SCREENING     Status: None   Collection Time    12/31/13  4:25 PM      Result Value Ref Range Status   MRSA by PCR NEGATIVE  NEGATIVE Final   Comment:            The GeneXpert MRSA Assay (FDA     approved for NASAL specimens     only), is one component of a     comprehensive MRSA colonization     surveillance program. It is not     intended to diagnose  MRSA     infection nor to guide or     monitor treatment for     MRSA infections.     Studies:  Recent x-ray studies have been reviewed in detail by the Attending Physician  Time spent :  35 mins  Erin Hearing, ANP [facility] For Consults/Admissions - Flow Manager - [phone] Office  [phone] Pager [phone]  On-Call/Text Page:      [email or web address]      password [facility]  01/04/2014, 1:14 PM   LOS: 4 days   I have personally examined this patient and reviewed the entire database. I have reviewed the above note, made any necessary editorial changes, and agree with its content.  Cherene Altes, MD [facility]

## 2014-01-04 NOTE — Progress Notes (Signed)
2 46fr sheaths and 1 70french sheath aspirated and removed from right femoral vein. Manual pressure applied for 20 minutes. BP 126/97 hr 82. No S+S of hematoma. Tegaderm dressing applied. Bedrest instructions given. Weak right dp pulse palpable.

## 2014-01-04 NOTE — Care Management Note (Addendum)
  Page 2 of 2   01/07/2014     2:46:04 PM CARE MANAGEMENT NOTE 01/07/2014  Patient:  [name], [name]   Account Number:  [id number]  Date Initiated:  01/01/2014  Documentation initiated by:  [facility]  Subjective/Objective Assessment:   Admitted with increasing SOB. Afib and CHF     Action/Plan:   [facility]  pcp dr Remo Lipps fry   Anticipated DC Date:  01/07/2014   Anticipated DC Plan:  [place] referral  Clinical Social Worker      DC Planning Services  CM consult      [facility] Choice  NA   Choice offered to / List presented to:             Status of service:  Completed, signed off Medicare Important Message given?  YES (If response is "NO", the following Medicare IM given date fields will be blank) Date Medicare IM given:  01/04/2014 Medicare IM given by:  Elissa Hefty Date Additional Medicare IM given:  01/07/2014 Additional Medicare IM given by:  Alecxander Mainwaring  Discharge Disposition:  [place]  Per UR Regulation:  Reviewed for med. necessity/level of care/duration of stay  If discussed at [place] of Stay Meetings, dates discussed:   01/05/2014  01/07/2014    Comments:  Mariann Laster RN, BSN, MSHL, CCM  Nurse - Case Manager,  (Unit [facility])  [phone]  01/07/2014 Dispostion:  SNF [facility] (SW active)  Mariann Laster RN, BSN, MSHL, CCM  Nurse - Case Manager,  (Unit [place])  [phone]  01/06/2014 Social:  patient is blind Interdisciplinary communication with [facility] requesting MD order for [facility] evaluation d/t hx/o patient having trouble managing ostomy when DTR is out of town. PT RECS:  SNF OT RECS:  Pending Disposition Plan:  [facility]HS vs SNF pending   Update:  PT PAYS $0 FOR MEDICATIONS- NO PRIOR AUTH REQUIRED ---01/05/2014 1501 by Elissa Hefty--- could you ck on copay for eliqus and xarelto and pradaxa and if prior auth req.thanks    ContactAmir, [place] Daughter [phone] 7/14 1501   debbie dowell rn,bsn pt has transf to 3e. saw note that may need eliquis-xarelto or pradaxa. have asked cm sec to ck on copay's.

## 2014-01-04 NOTE — Progress Notes (Signed)
SUBJECTIVE: The patient is doing well today.  At this time, she denies chest pain, shortness of breath, or any new concerns.  She is maintaining SR on Amiodarone.    Echo this admission demonstrates EF 20%, likely tachycardia mediated.  Pt has had no ischemic symptoms prior to admission.   CURRENT MEDICATIONS: . allopurinol  100 mg Oral Daily  . amiodarone  400 mg Oral Daily  . apixaban  5 mg Oral BID  . carvedilol  6.25 mg Oral BID WC  . docusate sodium  100 mg Oral BID  . folic acid  1 mg Oral Daily  . furosemide  20 mg Oral Daily  . isoniazid  300 mg Oral Daily  . ketoconazole  1 application Topical BID  . loratadine  10 mg Oral Daily  . megestrol  800 mg Oral BID AC  . methotrexate  20 mg Subcutaneous Weekly  . pantoprazole  40 mg Oral Daily  . pyridOXINE  50 mg Oral Daily  . vitamin C  500 mg Oral Daily   . sodium chloride 10 mL/hr at 01/03/14 2000    OBJECTIVE: Physical Exam: Filed Vitals:   01/03/14 2355 01/04/14 0000 01/04/14 0100 01/04/14 0400  BP:  85/67 114/76 133/72  Pulse: 82     Temp: 98.4 F (36.9 C)     TempSrc: Oral     Resp:  24 18 20   Height:      Weight:      SpO2: 100%       Intake/Output Summary (Last 24 hours) at 01/04/14 0710 Last data filed at 01/04/14 0500  Gross per 24 hour  Intake    820 ml  Output   2300 ml  Net  -1480 ml    Telemetry reveals sinus rhythm  GEN- The patient is well appearing, alert and oriented x 3 today.   Head- normocephalic, atraumatic Eyes-  Sclera clear, conjunctiva pink, right eye Ears- hearing intact Oropharynx- clear Neck- supple, no JVP Lymph- no cervical lymphadenopathy Lungs- Clear to ausculation bilaterally, normal work of breathing Heart- Regular rate and rhythm, no murmurs, rubs or gallops, PMI not laterally displaced, hyperdynamic precordium GI- soft, NT, ND, + BS Extremities- no clubbing, cyanosis, or edema, marked rheumatoid changes Skin- no rash or lesion Psych- euthymic mood, full  affect Neuro- strength and sensation are intact  LABS: Basic Metabolic Panel:  Recent Labs  01/02/14 0250 01/04/14 0248  NA 141 140  K 4.3 4.1  CL 103 101  CO2 24 25  GLUCOSE 119* 104*  BUN 16 18  CREATININE 1.15* 0.86  CALCIUM 8.7 9.1   Liver Function Tests:  Recent Labs  01/02/14 0250  AST 24  ALT 36*  ALKPHOS 89  BILITOT 0.4  PROT 6.7  ALBUMIN 3.2*   CBC:  Recent Labs  01/02/14 0250  WBC 6.1  HGB 9.5*  HCT 30.0*  MCV 93.5  PLT 289   D-Dimer:  Recent Labs  01/01/14 1153  DDIMER 2.55*    RADIOLOGY: Dg Chest Portable 1 View 12/31/2013   CLINICAL DATA:  Tachycardia and shortness of breath.  EXAM: PORTABLE CHEST - 1 VIEW  COMPARISON:  05/23/2013.  FINDINGS: 1123 hrs. Pulmonary vascular congestion noted with some probable interstitial edema dependently. No focal airspace consolidation or gross airspace pulmonary edema. The cardio pericardial silhouette is enlarged. Bones are diffusely demineralized. Chronic degenerative changes are seen in both shoulders. Telemetry leads overlie the chest.  IMPRESSION: Cardiomegaly with vascular congestion and probable dependent interstitial edema.  Electronically Signed   By: Misty Stanley M.D.   On: 12/31/2013 12:08    ASSESSMENT AND PLAN:  Active Problems:   BLINDNESS, BILATERAL   HYPERTENSION   GERD   Rheumatoid arthritis(714.0)   OSTEOPOROSIS   Marfan's syndrome   Atrial flutter   CHF, acute   Pulmonary HTN   Tachycardia   Acute renal failure   Iron deficiency anemia  Rec: The patient has reverted to NSR. She is on systemic anticoagulation. She has a tachy mediated CM. No indication for stress testing. I have recommended catheter ablation as she has a tachy medicated CM and her flutter is certain to return. She is reflecting on her options. Will rechceck later today.   Mikle Bosworth.D.

## 2014-01-05 LAB — CBC
HCT: 34.4 % — ABNORMAL LOW (ref 36.0–46.0)
Hemoglobin: 10.9 g/dL — ABNORMAL LOW (ref 12.0–15.0)
MCH: 29.5 pg (ref 26.0–34.0)
MCHC: 31.7 g/dL (ref 30.0–36.0)
MCV: 93 fL (ref 78.0–100.0)
Platelets: 329 10*3/uL (ref 150–400)
RBC: 3.7 MIL/uL — ABNORMAL LOW (ref 3.87–5.11)
RDW: 15.5 % (ref 11.5–15.5)
WBC: 7.8 10*3/uL (ref 4.0–10.5)

## 2014-01-05 LAB — BASIC METABOLIC PANEL
Anion gap: 15 (ref 5–15)
BUN: 21 mg/dL (ref 6–23)
CO2: 25 mEq/L (ref 19–32)
Calcium: 9.2 mg/dL (ref 8.4–10.5)
Chloride: 100 mEq/L (ref 96–112)
Creatinine, Ser: 0.92 mg/dL (ref 0.50–1.10)
GFR calc Af Amer: 79 mL/min — ABNORMAL LOW (ref >90)
GFR calc non Af Amer: 68 mL/min — ABNORMAL LOW (ref >90)
Glucose, Bld: 184 mg/dL — ABNORMAL HIGH (ref 70–99)
Potassium: 4 mEq/L (ref 3.7–5.3)
Sodium: 140 mEq/L (ref 137–147)

## 2014-01-05 LAB — MAGNESIUM: Magnesium: 1.5 mg/dL (ref 1.5–2.5)

## 2014-01-05 MED ORDER — MEGESTROL ACETATE 400 MG/10ML PO SUSP
800.0000 mg | Freq: Two times a day (BID) | ORAL | Status: DC
  Administered 2014-01-05: 800 mg via ORAL
  Administered 2014-01-05: 400 mg via ORAL
  Administered 2014-01-06 (×2): 800 mg via ORAL
  Filled 2014-01-05 (×6): qty 20

## 2014-01-05 NOTE — Progress Notes (Signed)
[facility]  SHONDRA CAPPS TIR:[id number] DOB: [birth year] DOA: 12/31/2013 PCP: [name], MD  Brief narrative: 58 year old female patient with a history of Marfan syndrome, rheumatoid arthritis on chronic immunosuppression, and hypertension. Patient endorsed one month of progressive shortness of breath with palpitations which worsened 48 hours prior to presentation. Because of these symptoms she presented to her primary care physician for evaluation and was started on metoprolol for possible junctional tachycardia. Because her symptoms worsened she presented to the emergency department.  In the ER she was noted to be tachycardic and rhythm was consistent with atrial fibrillation with RVR. Cardiology was consulted. Troponin was normal, hemoglobin was 9.1 which was down somewhat from previous reading of 11. Chest x-ray demonstrated cardiomegaly with vascular congestion and likely interstitial edema. Echo from 2014 revealed preserved LV function with an EF of 55%.  HPI/Subjective: Sleepy- immediately post RFA porcedure-no compliants  Assessment/Plan:  Atrial flutter Suffered hypotension with Cardizem and Lopressor so utilizing short term amiodarone - Apixaban initiated - rounding Gen Cardiologist suggested ablation no longer indicated as pt maintaining NSR on Amio but EP MD documented that likely flutter will recur even on meds and recommends ablation - d/w pt and explained rationale - I would not favor the long term combination of Amiodarone with Methotrexate, and therefore favor ablation as suggested per EP  -7/14: post RF Ablation and maintaining NSR -7/15: Amiodarone 400 mg daily with Apixaban 5 mg daily x 4 weeks only at dc per Cards and OK to dc from cardiology standpoint -7/15: Unfortunately need to clarify co pay for NOAC before dc  Acute severe systolic CHF  EF depressed per ECHO this admit (20%) - Likely mediated by tachycardia - TTE 2014 with  preserved LV function - to have f/u TTE in outpt f/u once rhythm corrected/controlled - if CM persists then should pursue OP stress testing to r/o ischemic etiology -currently compensated -Cards plans repeat ECHO in 4-6 weeks  Pulmonary HTN Mild - follow - consider eventually adding hydralazine if BP can tolerate     Hx of HYPERTENSION BP improving w/ restoration of NSR - follow w/o change for now   Acute renal failure GFR normal in April 2015 - suspect acute decline related to acute perfusion abnormalities from tachycardia - avoided ACE/ARB and NSAIDs - minimized contrast exposure - 7/14 has been normal x 2 days  Rheumatoid arthritis On chronic weekly methotrexate  Latent TB x 30 years Started on isoniazid and vitamin B 6 by OP MD with initiation of Methotrexate   Marfan's syndrome TTE in 2014 with normal aortic root and CT scan same year also without any evidence of aortic aneurysm  Hx of perforated diverticulitis s/p laparotomy / Jeanette Caprice procedure June 2014 Followed as outpt by [facility]   Chronic Iron deficiency anemia Hgb stable   BLINDNESS  GERD  OSTEOPOROSIS History of T10 compression fracture noted on November 2014 CT scan  DVT prophylaxis: Apixaban Code Status: Full Family Communication: No family at bedside Disposition Plan/Expected LOS: Stepdown  Consultants: Cardiology EP  Procedures: 7/10 TTE - EF 20% - diffuse hypokinesis - PA pressure 63mm Hg  Cultures:  None  Antibiotics: None  Objective: Blood pressure 120/77, pulse 78, temperature 97.9 F (36.6 C), temperature source Oral, resp. rate 18, height 5\' 8"  (1.727 m), weight 133 lb 9.6 oz (60.6 kg), last menstrual period 06/25/2006, SpO2 100.00%.  Intake/Output Summary (Last 24 hours) at 01/05/14 1307 Last data filed at 01/05/14 0930  Gross  per 24 hour  Intake    990 ml  Output   1100 ml  Net   -110 ml   Exam: General: No acute respiratory distress Lungs: Clear to auscultation bilaterally  without wheezes or crackles - pectus carinatum Cardiovascular: Regular rate and rythm, no peripheral edema Abdomen: Nontender, nondistended, soft, bowel sounds positive, no rebound, no ascites, no appreciable mass-colostomy bag with flatus Musculoskeletal: No significant cyanosis, clubbing of bilateral lower extremities-has marked rheumatoid deformities both hands   Scheduled Meds:  Scheduled Meds: . allopurinol  100 mg Oral Daily  . amiodarone  400 mg Oral Daily  . apixaban  5 mg Oral BID  . carvedilol  6.25 mg Oral BID WC  . docusate sodium  100 mg Oral BID  . folic acid  1 mg Oral Daily  . furosemide  20 mg Oral Daily  . isoniazid  300 mg Oral Daily  . ketoconazole  1 application Topical BID  . loratadine  10 mg Oral Daily  . megestrol  800 mg Oral BID AC  . methotrexate  20 mg Subcutaneous Weekly  . pantoprazole  40 mg Oral Daily  . pyridOXINE  50 mg Oral Daily  . sodium chloride  3 mL Intravenous Q12H  . vitamin C  500 mg Oral Daily   Data Reviewed: Basic Metabolic Panel:  Recent Labs Lab 12/31/13 1116 01/01/14 0130 01/02/14 0250 01/04/14 0248 01/05/14 0042  NA 142 141 141 140 140  K 4.5 4.1 4.3 4.1 4.0  CL 107 102 103 101 100  CO2 20 25 24 25 25   GLUCOSE 94 127* 119* 104* 184*  BUN 17 17 16 18 21   CREATININE 1.00 1.02 1.15* 0.86 0.92  CALCIUM 8.6 8.6 8.7 9.1 9.2  MG 1.8  --   --   --  1.5   Liver Function Tests:  Recent Labs Lab 01/02/14 0250  AST 24  ALT 36*  ALKPHOS 89  BILITOT 0.4  PROT 6.7  ALBUMIN 3.2*   CBC:  Recent Labs Lab 12/31/13 1116 01/01/14 0300 01/02/14 0250 01/05/14 0042  WBC 6.3 5.6 6.1 7.8  NEUTROABS 4.8  --   --   --   HGB 9.1* 9.4* 9.5* 10.9*  HCT 38.2 28.9* 30.0* 34.4*  MCV 129.5* 93.8 93.5 93.0  PLT 227 256 289 329   Cardiac Enzymes:  Recent Labs Lab 12/31/13 1116 12/31/13 1950 01/01/14 0130 01/01/14 0641  TROPONINI <0.30 <0.30 <0.30 <0.30   BNP (last 3 results)  Recent Labs  12/31/13 1116 01/04/14 0245   PROBNP 6068.0* 3355.0*    Recent Results (from the past 240 hour(s))  MRSA PCR SCREENING     Status: None   Collection Time    12/31/13  4:25 PM      Result Value Ref Range Status   MRSA by PCR NEGATIVE  NEGATIVE Final   Comment:            The GeneXpert MRSA Assay (FDA     approved for NASAL specimens     only), is one component of a     comprehensive MRSA colonization     surveillance program. It is not     intended to diagnose MRSA     infection nor to guide or     monitor treatment for     MRSA infections.     Studies:  Recent x-ray studies have been reviewed in detail by the Attending Physician  Time spent :  35 mins  Erin Hearing,  ANP [facility] For Consults/Admissions - Flow Manager - [phone] Office  [phone] Pager [phone]  On-Call/Text Page:      [email or web address]      password [facility]  01/05/2014, 1:07 PM   LOS: 5 days    I have examined the patient, reviewed the chart and modified the above note which I agree with.   Cardell Rachel,MD Pager # on [email or web address] 01/05/2014, 5:22 PM

## 2014-01-05 NOTE — Progress Notes (Signed)
Report given to receiving RN. Patient in bed resting. No verbal complaints and no signs or symptoms of distress or discomfort noted.  

## 2014-01-05 NOTE — Progress Notes (Signed)
EP Rounding Note  Patient ID: COTY STUDENT, female   DOB: 1955-11-25, 58 y.o.   MRN: [id number]   Patient Name: [name] Date of Encounter: 01/05/2014     Active Problems:   BLINDNESS, BILATERAL   HYPERTENSION   GERD   Rheumatoid arthritis(714.0)   OSTEOPOROSIS   Marfan's syndrome   Atrial flutter   CHF, acute   Pulmonary HTN   Tachycardia   Acute renal failure   Iron deficiency anemia    SUBJECTIVE  No chest pain or sob  CURRENT MEDS . allopurinol  100 mg Oral Daily  . amiodarone  400 mg Oral Daily  . apixaban  5 mg Oral BID  . carvedilol  6.25 mg Oral BID WC  . docusate sodium  100 mg Oral BID  . folic acid  1 mg Oral Daily  . furosemide  20 mg Oral Daily  . isoniazid  300 mg Oral Daily  . ketoconazole  1 application Topical BID  . loratadine  10 mg Oral Daily  . megestrol  800 mg Oral BID AC  . methotrexate  20 mg Subcutaneous Weekly  . pantoprazole  40 mg Oral Daily  . pyridOXINE  50 mg Oral Daily  . sodium chloride  3 mL Intravenous Q12H  . vitamin C  500 mg Oral Daily    OBJECTIVE  Filed Vitals:   01/05/14 0000 01/05/14 0300 01/05/14 0400 01/05/14 0720  BP: 122/76 110/70 110/70 134/84  Pulse:  86  82  Temp:  97.3 F (36.3 C)  97.8 F (36.6 C)  TempSrc:  Oral  Oral  Resp: 18 20 16 23   Height:      Weight:  135 lb 12.9 oz (61.6 kg)    SpO2:  97%  98%    Intake/Output Summary (Last 24 hours) at 01/05/14 0728 Last data filed at 01/05/14 0721  Gross per 24 hour  Intake    870 ml  Output   2350 ml  Net  -1480 ml   Filed Weights   12/31/13 2100 01/03/14 0800 01/05/14 0300  Weight: 136 lb (61.689 kg) 136 lb 7.4 oz (61.9 kg) 135 lb 12.9 oz (61.6 kg)    PHYSICAL EXAM  General: Pleasant, NAD. Neuro: Alert and oriented X 3. Moves all extremities spontaneously. Psych: Normal affect. HEENT: blind  Neck: Supple without bruits or JVD. Lungs:  Resp regular and unlabored, CTA. Heart: RRR no s3, s4, or murmurs. Abdomen: Soft, non-tender,  non-distended, BS + x 4.  Extremities: No clubbing, cyanosis or edema. Marked rheumatoid changes  Accessory Clinical Findings  CBC  Recent Labs  01/05/14 0042  WBC 7.8  HGB 10.9*  HCT 34.4*  MCV 93.0  PLT 762   Basic Metabolic Panel  Recent Labs  01/04/14 0248 01/05/14 0042  NA 140 140  K 4.1 4.0  CL 101 100  CO2 25 25  GLUCOSE 104* 184*  BUN 18 21  CREATININE 0.86 0.92  CALCIUM 9.1 9.2  MG  --  1.5   Liver Function Tests No results found for this basename: AST, ALT, ALKPHOS, BILITOT, PROT, ALBUMIN,  in the last 72 hours No results found for this basename: LIPASE, AMYLASE,  in the last 72 hours Cardiac Enzymes No results found for this basename: CKTOTAL, CKMB, CKMBINDEX, TROPONINI,  in the last 72 hours BNP No components found with this basename: POCBNP,  D-Dimer No results found for this basename: DDIMER,  in the last 72 hours Hemoglobin A1C No results found  for this basename: HGBA1C,  in the last 72 hours Fasting Lipid Panel No results found for this basename: CHOL, HDL, LDLCALC, TRIG, CHOLHDL, LDLDIRECT,  in the last 72 hours Thyroid Function Tests No results found for this basename: TSH, T4TOTAL, FREET3, T3FREE, THYROIDAB,  in the last 72 hours  TELE  NSR  Radiology/Studies  Dg Chest Portable 1 View  12/31/2013   CLINICAL DATA:  Tachycardia and shortness of breath.  EXAM: PORTABLE CHEST - 1 VIEW  COMPARISON:  05/23/2013.  FINDINGS: 1123 hrs. Pulmonary vascular congestion noted with some probable interstitial edema dependently. No focal airspace consolidation or gross airspace pulmonary edema. The cardio pericardial silhouette is enlarged. Bones are diffusely demineralized. Chronic degenerative changes are seen in both shoulders. Telemetry leads overlie the chest.  IMPRESSION: Cardiomegaly with vascular congestion and probable dependent interstitial edema.   Electronically Signed   By: Misty Stanley M.D.   On: 12/31/2013 12:08    ASSESSMENT AND PLAN 1.  Atrial flutter with a RVR 2. S/p catheter ablation of 2 distinct atrial flutters (see details of procedure note to follow) Disp: ok for discharge today from my perspective. I would discharge on amio 400 mg daily eliquis 5 mg twice daily for 4 weeks then stop both. I will see back in followup in 3-4 weeks. We will plan to repeat 2D echo in 4-6 weeks to confirm that LV dysfunction was tachycardia mediated.  Hadeel Hillebrand,M.D.   Jaxsyn Azam,M.D.  01/05/2014 7:28 AM

## 2014-01-05 NOTE — CV Procedure (Signed)
Electrophysiologic procedure note  Procedure: Invasive electrophysiology study and catheter ablation of both typical atrial flutter, and a right atrial lateral wall , Crista flutter.  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 After the usual preparation and draping, intravenous Versed and fentanyl were used for sedation. A 6 French decapolar catheter was inserted percutaneously into the right femoral vein and advanced to the coronary sinus. A 6 French quadripolar catheter was inserted percutaneously into the right femoral vein and advanced to the His bundle region. Rapid atrial pacing was carried out from the coronary sinus and resulted in induction of atrial flutter, pacing at a cycle length of 190 ms. A 12-lead ECG demonstrated typical atrial flutter. The cycle length was 240 ms. Mapping demonstrated typical atrial flutter. The coronary sinus ostial bipole was activated early. A 7 French quadripolar ablation catheter was inserted percutaneously into the right femoral vein and advanced under fluoroscopic guidance to the right atrium. Mapping of the right atrium and tricuspid valve were carried out. The atrial flutter isthmus was displaced inferiorly. Four radiofrequency energy applications were carried out. During the fourth energy application, the atrial flutter activation sequence was noted to change. A 12-lead EKG demonstrated and confirmed that the typical atrial flutter was no longer present, but an atypical flutter had emerged. It was unclear whether or not this represented clockwise atrial flutter around the tricuspid valve annulus or some other reentrant circuit. After 6 additional radiofrequency energy applications were delivered, additional mapping was carried out. Entrainment  mapping along the lateral wall of the right atrium demonstrated a post pacing interval very similar to the atrial flutter cycle length. At this point a 20 pole halo catheter was inserted percutaneously into the right femoral vein and advanced under fluoroscopic guidance into the right atrium. Additional mapping was carried out. The lateral portion of the right atrium was early in its activation. Additional intravenous mapping demonstrated a post pacing interval equal to the atrial flutter cycle length. 2 radiofrequency energy applications were then delivered at approximately 9:00 in the LAO projection, just posterior to the tricuspid valve annulus. This resulted in immediate termination of the patient's atrial flutter. It should also be noted that there was a fractionated atrial electrograms at the site of successful ablation and that the atrial activation in this location was quite early. Following the final RF energy application, additional rapid atrial pacing was carried out and stepwise decrease down to 220 ms. There was no inducible atrial flutter or SVT. The patient was observed for approximately 15 minutes with no inducible arrhythmias. The catheters were then removed, hemostasis was assured, and the patient was returned to her room in satisfactory condition.  Complications: There were no immediate procedural complications.  Results. A. Baseline ECG. The baseline ECG demonstrated sinus rhythm. There are normal axis and intervals except for a short PR interval. B. Baseline intervals. The sinus node cycle length was 818 ms. The QRS duration 86 ms. The AH interval 74 ms. The HV interval 42 ms. Following ablation, there was no change in the basic intervals except for the PR interval increasing in the [facility] interval increasing. C. Rapid ventricular pacing. Rapid ventricular pacing was carried out from the right ventricle demonstrating VA block at 300 ms. During rapid ventricular pacing, the atrial activation  was midline and decremental. There was no inducible SVT. D. Programmed ventricular  stimulation. Programmed ventricular stimulation was carried out from the right ventricle at a basic cycle length of 500 ms. S1-S2 interval was stepwise decrease down to ventricular refractoriness. There was no inducible SVT. E. Rapid atrial pacing. Rapid atrial pacing was carried out from the coronary sinus as well as the right atrium and stepwise decrease down to 190 ms resulting in the initiation of atrial flutter. AV block was demonstrated at 290 ms. Following catheter ablation, AV block was demonstrated at 500 ms. F. Programmed atrial stimulation. Programmed atrial stimulation was carried out from the atrium at basic cycle length of 500 ms. The S1-S2 interval was stepwise decrease down to 220 ms or AV block was demonstrated. During programmed atrial stimulation, there was no inducible SVT. G. Arrhythmias observed. 1. Atrial flutter, cycle length 240 ms, initiation, rapid atrial pacing, duration sustained, method of termination, catheter ablation. 2. Macro reentrant atrial tachycardia, Crista. 260 ms, initiation, spontaneous during catheter ablation of typical atrial flutter, duration sustained, method of termination catheter ablation along the lateral wall of the right atrium. H. Radiofrequency energy application. A total of 10 radiofrequency energy applications were delivered to the tricuspid valve annulus/inferior vena cava resulting in termination of the atrial flutter utilizing the tricuspid valve annulus for reentry. 2 radial frequency energy applications were delivered to the lateral wall of the right atrium near the region of the crista terminalis resulting in termination of this second distinct macro reentrant atrial tachycardia. I. Mapping. Mapping of the initial tachycardia demonstrated typical counterclockwise tricuspid annular reentry. Mapping of the second atrial arrhythmia demonstrated presumed clockwise  reentry around the crista terminalis along the lateral wall of the right atrium.  Conclusion. Successful electrophysiology study and catheter ablation of 2 distinct reentrant circuits. Typical atrial flutter was successfully ablated with 10 radiofrequency energy applications applied to the region between the tricuspid valve annulus and the inferior vena cava. A second tachycardia was successfully ablated along the lateral wall the right atrium near the crista terminalis.  Cristopher Peru, M.D.

## 2014-01-06 LAB — BASIC METABOLIC PANEL
Anion gap: 16 — ABNORMAL HIGH (ref 5–15)
BUN: 23 mg/dL (ref 6–23)
CO2: 24 mEq/L (ref 19–32)
Calcium: 8.8 mg/dL (ref 8.4–10.5)
Chloride: 99 mEq/L (ref 96–112)
Creatinine, Ser: 0.84 mg/dL (ref 0.50–1.10)
GFR calc Af Amer: 88 mL/min — ABNORMAL LOW (ref >90)
GFR calc non Af Amer: 76 mL/min — ABNORMAL LOW (ref >90)
Glucose, Bld: 118 mg/dL — ABNORMAL HIGH (ref 70–99)
Potassium: 4.3 mEq/L (ref 3.7–5.3)
Sodium: 139 mEq/L (ref 137–147)

## 2014-01-06 MED ORDER — APIXABAN 5 MG PO TABS: 5.0000 mg | ORAL_TABLET | Freq: Two times a day (BID) | ORAL | Status: DC

## 2014-01-06 MED ORDER — AMIODARONE HCL 400 MG PO TABS: 400.0000 mg | ORAL_TABLET | Freq: Every day | ORAL | Status: DC

## 2014-01-06 MED ORDER — FUROSEMIDE 20 MG PO TABS: 20.0000 mg | ORAL_TABLET | Freq: Every day | ORAL | Status: DC

## 2014-01-06 MED ORDER — CARVEDILOL 6.25 MG PO TABS: 6.2500 mg | ORAL_TABLET | Freq: Two times a day (BID) | ORAL | Status: DC

## 2014-01-06 NOTE — Progress Notes (Signed)
Received prescreen request for inpatient rehab and have reviewed pt's case. PT recommending SNF or CIR. Pt has [facility] and based on pt's current diagnosis of Atrial Flutter, it is unlikely that her insurance would authorize acute inpatient rehab.  At this time, we would recommend pursuing skilled nursing facility or home with home health support.   Thanks.  Nanetta Batty, PT Rehabilitation Admissions Coordinator [phone]

## 2014-01-06 NOTE — Evaluation (Signed)
Physical Therapy Evaluation Patient Details Name: [name] MRN: [id number] DOB: 10-16-1955 Today's Date: 01/06/2014   History of Present Illness  Adm 12/31/13 with SOB and found to be in aflutter. 01/05/14 underwent ablation. PMHx- Marfan's syndrome, RA, HTN, gout, blind, bil hearing loss with hearing aids, CVA, colostomy, bil TKR  Clinical Impression  Pt admitted with aflutter and underwent ablation. She has history of falls and blindness. She is currently deconditioned and very unsteady after hospitalized 7 days with very limited activity. She agrees she should not go home today due to high risk for falls, however she adamantly refuses SNF (due to prior experience) and thinks she "just needs a few days of getting up and moving around" to be ready to be home alone. Pt currently with functional limitations due to the deficits listed below (see PT Problem List).  Pt will benefit from skilled PT to increase their independence and safety with mobility to allow discharge to the venue listed below.       Follow Up Recommendations SNF;Supervision/Assistance - 24 hour (Pt could benefit from CIR, however do not think her insurance will cover this for her admitting diagnosis)    Equipment Recommendations  None recommended by PT    Recommendations for Other Services OT consult     Precautions / Restrictions Precautions Precautions: Fall      Mobility  Bed Mobility Overal bed mobility: Modified Independent             General bed mobility comments: incr effort and time; HOB flat  Transfers Overall transfer level: Needs assistance Equipment used: Rolling walker (2 wheeled) Transfers: Sit to/from Stand Sit to Stand: Min assist;From elevated surface         General transfer comment: bed elevated to simulate home environment; pt unable to stand without some physical assist to extend knees and shift forward over her BOS  Ambulation/Gait Ambulation/Gait assistance: Mod  assist Ambulation Distance (Feet): 40 Feet Assistive device: Rolling walker (2 wheeled) Gait Pattern/deviations: Step-through pattern;Decreased stride length;Staggering left     General Gait Details: pt with staggering LOB when turning x2 and walking straight path x 1; max directional cues with physical assist for RW due to blindness and unfamiliar environment  Stairs            Wheelchair Mobility    Modified Rankin (Stroke Patients Only)       Balance Overall balance assessment: Needs assistance Sitting-balance support: No upper extremity supported;Feet supported Sitting balance-Leahy Scale: Good     Standing balance support: Bilateral upper extremity supported Standing balance-Leahy Scale: Poor                               Pertinent Vitals/Pain SaO2 100% on RA with activity HR max 90     Home Living Family/patient expects to be discharged to:: Private residence Living Arrangements: Alone Available Help at Discharge: Personal care attendant (pt unable to state how many days/hours per week ) Type of Home: Apartment Home Access: Level entry     Home Layout: One level Home Equipment: Walker - 2 wheels;Bedside commode;Shower seat Additional Comments: Pt states "I don't know" when asked to tell me the personal aide's schedule; stated "no" when asked if she had other family or friends to help her, then later stated her daughter helps her put her shoes on (when taking her out to appointments)    Prior Function Level of Independence: Needs assistance  Gait / Transfers Assistance Needed: reports she walks with or without RW inside her apartment; uses RW when goes out  ADL's / Homemaking Assistance Needed: pt unclear on what ADL's her aide assists her with        Hand Dominance        Extremity/Trunk Assessment   Upper Extremity Assessment: RUE deficits/detail;LUE deficits/detail RUE Deficits / Details: RA changes in hands, however able to hold  handles of RW and maneuver RW with cues (due to blindness)         Lower Extremity Assessment: Generalized weakness (wears specialty shoes due to orthopedic changes in feet)      Cervical / Trunk Assessment: Normal  Communication   Communication: HOH  Cognition Arousal/Alertness: Awake/alert Behavior During Therapy: WFL for tasks assessed/performed Overall Cognitive Status: No family/caregiver present to determine baseline cognitive functioning Area of Impairment: Safety/judgement         Safety/Judgement: Decreased awareness of safety     General Comments: When asked if she felt safe to go home, she stated "no" When discussed whether she thinks she would fall, she says "I hope not." When asked what she would do if she fell, "I don't know. I used to be able to get myself up, but I don't think I could right now. I'd call someone." however pt with no plan as to how she'd call to get help    General Comments General comments (skin integrity, edema, etc.): Discussed lack of assistance at home and need for further therapy prior to discharge home. Pt adamantly refusing SNF stating she has done that before and will not return. She shows poor insight into her current deficits and needs for assistance.    Exercises        Assessment/Plan    PT Assessment Patient needs continued PT services  PT Diagnosis Difficulty walking;Generalized weakness   PT Problem List Decreased strength;Decreased activity tolerance;Decreased balance;Decreased mobility;Decreased safety awareness  PT Treatment Interventions DME instruction;Gait training;Functional mobility training;Therapeutic activities;Therapeutic exercise;Balance training;Cognitive remediation;Patient/family education   PT Goals (Current goals can be found in the Care Plan section) Acute Rehab PT Goals Patient Stated Goal: to go home in a few days PT Goal Formulation: With patient Time For Goal Achievement: 01/13/14 Potential to Achieve  Goals: Good    Frequency Min 3X/week   Barriers to discharge Decreased caregiver support lacks 24/7 assist    Co-evaluation               End of Session Equipment Utilized During Treatment: Gait belt;Other (comment) (her ortho shoes) Activity Tolerance: Patient limited by fatigue Patient left: in chair;with call bell/phone within reach;with chair alarm set;with nursing/sitter in room Nurse Communication: Mobility status;Other (comment) (not ready for d/c home and refusing SNF)         Time: 9629-5284 PT Time Calculation (min): 26 min   Charges:   PT Evaluation $Initial PT Evaluation Tier I: 1 Procedure PT Treatments $Gait Training: 8-22 mins   PT G Codes:          Elise Knobloch 01/19/2014, 12:15 PM Pager [phone]

## 2014-01-06 NOTE — Discharge Summary (Addendum)
Physician Discharge Summary  [name] YSA:[id number] DOB: Aug 28, 1955 DOA: 12/31/2013  PCP: [name], MD  Admit date: 12/31/2013 Discharge date: 01/07/2014  Time spent: 30 minutes  Recommendations for Outpatient Follow-up:  1. Follow up with [facility] as scheduled on 8/19. 2. Arrange OP follow up with your PCP in 2 weeks after dc 3. Cardiology recommend repeat ECHO in about 6 weeks to follow EF after rate controlled. 4. Plan is to discharge to SNF.  Discharge Diagnoses:  Active Problems:   Atrial flutter converted to NSR after Amiodarone   Post Ablation   CHF, acute-resolved/Tachycardia mediated cardiomyopathy   Pulmonary HTN   HYPERTENSION   Rheumatoid arthritis   Marfan's syndrome   Iron deficiency anemia   BLINDNESS, BILATERAL   GERD   OSTEOPOROSIS   Acute renal failure-resolved   Discharge Condition: stable  Diet recommendation:  Heart Healthy  Filed Weights   01/05/14 1228 01/06/14 0500 01/07/14 0654  Weight: 60.6 kg (133 lb 9.6 oz) 60.4 kg (133 lb 2.5 oz) 59.3 kg (130 lb 11.7 oz)    History of present illness:  58 year old female patient with a history of Marfan syndrome, rheumatoid arthritis on chronic immunosuppression, and hypertension. Patient endorsed one month of progressive shortness of breath with palpitations which worsened 48 hours prior to presentation. Because of these symptoms she presented to her primary care physician for evaluation and was started on metoprolol for possible junctional tachycardia. Because her symptoms worsened she presented to the emergency department.   In the ER she was noted to be tachycardic and rhythm was consistent with atrial fibrillation with RVR. Cardiology was consulted. Troponin was normal, hemoglobin was 9.1 which was down somewhat from previous reading of 11. Chest x-ray demonstrated cardiomegaly with vascular congestion and likely interstitial edema. Echo from 2014 revealed preserved LV function with an  EF of 55%   Hospital Course:  Atrial flutter now in NSR - Developed hypotension with Cardizem and Lopressor so initiated amiodarone as well as Apixaban with consideration for ablation noting did convert to NSR on Amiodarone - Rounding Gen Cardiologist suggested ablation no longer indicated as pt maintaining NSR on Amio but EP MD documented that likely flutter will recur even on meds and recommends ablation - d/w pt and explained rationale - attending MD did not favor the long term combination of Amiodarone with Methotrexate, and therefore favored ablation as suggested per EP  - On 7/14 underwent RF Ablation and was maintaining NSR  - Continue Amiodarone 400 mg daily with Apixaban 5 mg twice daily x 4 weeks only at dc. - Changed from Lopressor was dc'd by Cards in favor of Coreg - Qtc 484 on day of dc  Acute severe systolic CHF  - EF depressed per ECHO this admit (20%) - Likely mediated by tachycardia - TTE 2014 with preserved LV function - to have f/u TTE in outpt f/u once rhythm corrected/controlled - if CM persists then should pursue OP stress testing to r/o ischemic etiology  - Currently compensated and will dc on PO Lasix -Cards plans repeat ECHO in 4-6 weeks. - Estimated dry weight 59.3 kg.  Pulmonary HTN  - Mild - follow - consider eventually adding hydralazine vs resume pre admit Norvasc if BP can tolerate   Hx of HYPERTENSION  - BP improved w/ restoration of NSR - follow w/o change for now   Acute renal failure  - GFR normal in April 2015 - suspect acute decline related to acute perfusion abnormalities from  tachycardia - avoided ACE/ARB and NSAIDs - minimized contrast exposure - 7/14 has been normal x 2 days   Physical deconditioning - PT recommended SNF-obtained OT eval as well.  Rheumatoid arthritis  - On chronic weekly methotrexate   Latent TB x 30 years  - Started on isoniazid and vitamin B 6 by OP MD with initiation of Methotrexate   Marfan's syndrome  T- TE in 2014  with normal aortic root and CT scan same year also without any evidence of aortic aneurysm   Hx of perforated diverticulitis s/p laparotomy / Jeanette Caprice procedure June 2014  - Followed as outpt by [facility]   Chronic Iron deficiency anemia  - Hgb stable   BLINDNESS   GERD   OSTEOPOROSIS  History of T10 compression fracture noted on November 2014 CT scan   Procedures: 7/10 TTE - EF 20% - diffuse hypokinesis - PA pressure 26mm Hg  7/14 RF Ablation  Consultations: Cardiology  EP   Discharge Exam: Filed Vitals:   01/07/14 0654  BP: 117/70  Pulse: 69  Temp: 98.5 F (36.9 C)  Resp: 17   General: No acute respiratory distress  Lungs: Clear to auscultation bilaterally without wheezes or crackles - pectus carinatum  Cardiovascular: Regular rate and rythm, no peripheral edema  Abdomen: Nontender, nondistended, soft, bowel sounds positive, no rebound, no ascites, no appreciable mass-colostomy bag with flatus  Musculoskeletal: No significant cyanosis, clubbing of bilateral lower extremities-has marked rheumatoid deformities both hands   Discharge Instructions You were cared for by a hospitalist during your hospital stay. If you have any questions about your discharge medications or the care you received while you were in the hospital after you are discharged, you can call the unit and asked to speak with the hospitalist on call if the hospitalist that took care of you is not available. Once you are discharged, your primary care physician will handle any further medical issues. Please note that NO REFILLS for any discharge medications will be authorized once you are discharged, as it is imperative that you return to your primary care physician (or establish a relationship with a primary care physician if you do not have one) for your aftercare needs so that they can reassess your need for medications and monitor your lab values.      Discharge Instructions   Diet - low sodium heart healthy     Complete by:  As directed      Increase activity slowly    Complete by:  As directed             Medication List    STOP taking these medications       amLODipine 10 MG tablet  Commonly known as:  NORVASC     metoprolol succinate 25 MG 24 hr tablet  Commonly known as:  TOPROL-XL      TAKE these medications       albuterol 108 (90 BASE) MCG/ACT inhaler  Commonly known as:  PROVENTIL HFA;VENTOLIN HFA  Inhale 1 puff into the lungs every 4 (four) hours as needed for wheezing or shortness of breath.     allopurinol 100 MG tablet  Commonly known as:  ZYLOPRIM  Take 100 mg by mouth daily.     amiodarone 400 MG tablet  Commonly known as:  PACERONE  Take 1 tablet (400 mg total) by mouth daily.     apixaban 5 MG Tabs tablet  Commonly known as:  ELIQUIS  Take 1 tablet (5 mg total) by mouth  2 (two) times daily.     CALCIUM + D PO  Take 1,200 mg by mouth daily.     carvedilol 6.25 MG tablet  Commonly known as:  COREG  Take 1 tablet (6.25 mg total) by mouth 2 (two) times daily with a meal.     cetirizine 10 MG tablet  Commonly known as:  ZYRTEC  Take 10 mg by mouth daily.     cyclobenzaprine 10 MG tablet  Commonly known as:  FLEXERIL  Take 1 tablet (10 mg total) by mouth 3 (three) times daily as needed for muscle spasms.     docusate sodium 100 MG capsule  Commonly known as:  COLACE  Take 100 mg by mouth 2 (two) times daily.     folic acid 1 MG tablet  Commonly known as:  FOLVITE  Take 1 mg by mouth daily.     furosemide 20 MG tablet  Commonly known as:  LASIX  Take 1 tablet (20 mg total) by mouth daily.     HYDROcodone-acetaminophen 10-325 MG per tablet  Commonly known as:  NORCO  Take 1 tablet by mouth every 6 (six) hours as needed for moderate pain.     isoniazid 300 MG tablet  Commonly known as:  NYDRAZID  Take 300 mg by mouth daily.     ketoconazole 2 % cream  Commonly known as:  NIZORAL  Apply 1 application topically 2 (two) times daily.      megestrol 625 MG/5ML suspension  Commonly known as:  MEGACE ES  Take 5 mL (625 mg total) by mouth 2 (two) times daily before a meal.     methotrexate 25 MG/ML injection  Inject 20 mg into the skin once a week. Take on Mondays     MULTIVITAMIN PO  Take 1 tablet by mouth daily.     NEXIUM 40 MG capsule  Generic drug:  esomeprazole  take 1 capsule by mouth twice a day     pyridOXINE 50 MG tablet  Commonly known as:  VITAMIN B-6  Take 50 mg by mouth daily.     risedronate 150 MG tablet  Commonly known as:  ACTONEL  Take 150 mg by mouth every 30 (thirty) days. with water on empty stomach, nothing by mouth or lie down for next 30 minutes.     traMADol 50 MG tablet  Commonly known as:  ULTRAM  Take 50 mg by mouth every 6 (six) hours as needed for moderate pain.     VITAMIN B 12 PO  Take 1 tablet by mouth daily.     vitamin C 500 MG tablet  Commonly known as:  ASCORBIC ACID  Take 500 mg by mouth daily.       No Known Allergies Follow-up Information   Call Cristopher Peru, MD. (to arrange post hospital visit-you have an appointment with the [facility] on [facility]/19 at 3 pm)    Specialty:  Cardiology   Contact information:   [address] [address] [phone]       Schedule an appointment as soon as possible for a visit with FRY,STEPHEN A, MD. (to be seen within 2 weeks of discharge)    Specialty:  Family Medicine   Contact information:   [place] [address] [phone]       Follow up with Cristopher Peru, MD In 4 weeks. (hospital follow up)    Specialty:  Cardiology   Contact information:   [address] [place] [address] [phone]        The results of significant diagnostics from this hospitalization (including imaging, microbiology, ancillary and laboratory) are listed below for reference.    Significant Diagnostic Studies: Dg Chest Portable 1 View  12/31/2013   CLINICAL DATA:  Tachycardia and  shortness of breath.  EXAM: PORTABLE CHEST - 1 VIEW  COMPARISON:  05/23/2013.  FINDINGS: 1123 hrs. Pulmonary vascular congestion noted with some probable interstitial edema dependently. No focal airspace consolidation or gross airspace pulmonary edema. The cardio pericardial silhouette is enlarged. Bones are diffusely demineralized. Chronic degenerative changes are seen in both shoulders. Telemetry leads overlie the chest.  IMPRESSION: Cardiomegaly with vascular congestion and probable dependent interstitial edema.   Electronically Signed   By: Misty Stanley M.D.   On: 12/31/2013 12:08    Microbiology: Recent Results (from the past 240 hour(s))  MRSA PCR SCREENING     Status: None   Collection Time    12/31/13  4:25 PM      Result Value Ref Range Status   MRSA by PCR NEGATIVE  NEGATIVE Final   Comment:            The GeneXpert MRSA Assay (FDA     approved for NASAL specimens     only), is one component of a     comprehensive MRSA colonization     surveillance program. It is not     intended to diagnose MRSA     infection nor to guide or     monitor treatment for     MRSA infections.     Labs: Basic Metabolic Panel:  Recent Labs Lab 12/31/13 1116  01/02/14 0250 01/04/14 0248 01/05/14 0042 01/06/14 0118 01/07/14 0456  NA 142  < > 141 140 140 139 143  K 4.5  < > 4.3 4.1 4.0 4.3 4.5  CL 107  < > 103 101 100 99 104  CO2 20  < > 24 25 25 24 26   GLUCOSE 94  < > 119* 104* 184* 118* 97  BUN 17  < > 16 18 21 23 18   CREATININE 1.00  < > 1.15* 0.86 0.92 0.84 0.81  CALCIUM 8.6  < > 8.7 9.1 9.2 8.8 8.9  MG 1.8  --   --   --  1.5  --   --   < > = values in this interval not displayed. Liver Function Tests:  Recent Labs Lab 01/02/14 0250  AST 24  ALT 36*  ALKPHOS 89  BILITOT 0.4  PROT 6.7  ALBUMIN 3.2*   No results found for this basename: LIPASE, AMYLASE,  in the last 168 hours No results found for this basename: AMMONIA,  in the last 168 hours CBC:  Recent Labs Lab  12/31/13 1116 01/01/14 0300 01/02/14 0250 01/05/14 0042  WBC 6.3 5.6 6.1 7.8  NEUTROABS 4.8  --   --   --   HGB 9.1* 9.4* 9.5* 10.9*  HCT 38.2 28.9* 30.0* 34.4*  MCV 129.5* 93.8 93.5 93.0  PLT 227 256 289 329   Cardiac Enzymes:  Recent Labs Lab 12/31/13 1116 12/31/13 1950 01/01/14 0130 01/01/14 0641  TROPONINI <0.30 <0.30 <0.30 <0.30   BNP: BNP (last 3 results)  Recent Labs  12/31/13 1116 01/04/14 0245  PROBNP 6068.0* 3355.0*   CBG: No results found for this basename: GLUCAP,  in the last 168 hours     Signed:  Charlynne Cousins ANP  [facility] 01/07/2014,  8:21 AM

## 2014-01-06 NOTE — Progress Notes (Signed)
[facility]  JACQUE BYRON JME:[id number] DOB: 1956-02-27 DOA: 12/31/2013 PCP: [name], MD  Brief narrative: 58 year old female patient with a history of Marfan syndrome, rheumatoid arthritis on chronic immunosuppression, and hypertension. Patient endorsed one month of progressive shortness of breath with palpitations which worsened 48 hours prior to presentation. Because of these symptoms she presented to her primary care physician for evaluation and was started on metoprolol for possible junctional tachycardia. Because her symptoms worsened she presented to the emergency department.  In the ER she was noted to be tachycardic and rhythm was consistent with atrial fibrillation with RVR. Cardiology was consulted. Troponin was normal, hemoglobin was 9.1 which was down somewhat from previous reading of 11. Chest x-ray demonstrated cardiomegaly with vascular congestion and likely interstitial edema. Echo from 2014 revealed preserved LV function with an EF of 55%.  HPI/Subjective: Awake- worried about going home alone but no physical complaints  Assessment/Plan:  Atrial flutter -Developed hypotension with Cardizem and Lopressor so used amiodarone instead with subsequent conversion to NSR  -EP consulted and recommended ablation -Apixaban was also initiated this admission by Cardiology  -7/14: RF Ablation with maintenance of NSR -7/15: Amiodarone 400 mg daily with Apixaban 5 mg twice daily x 4 weeks only recommended by Cards at discharge -7/15: CM confirms no co pay for Apixaban   Acute severe systolic CHF  -EF depressed per ECHO this admit (20%) - Likely mediated by tachycardia - TTE 2014 with preserved LV function - to have f/u TTE in outpt f/u once rhythm corrected/controlled - if CM persists then should pursue OP stress testing to r/o ischemic etiology -currently compensated -Cards plans repeat ECHO in 4-6 weeks  Physical deconditioning -PT  recommended SNF- pt initially reluctant but after she spoke with her daughter agreed to go to SNF -OT eval requested -SW consulted for bed placement  Pulmonary HTN Mild - follow - consider eventually adding hydralazine if BP can tolerate     Hx of HYPERTENSION BP improving w/ restoration of NSR - follow w/o change for now   Acute renal failure GFR normal in April 2015 - suspect acute decline related to acute perfusion abnormalities from tachycardia - avoided ACE/ARB and NSAIDs - minimized contrast exposure - 7/14 has been normal x 2 days  Rheumatoid arthritis On chronic weekly methotrexate  Latent TB x 30 years Started on isoniazid and vitamin B 6 by OP MD with initiation of Methotrexate   Marfan's syndrome TTE in 2014 with normal aortic root and CT scan same year also without any evidence of aortic aneurysm  Hx of perforated diverticulitis s/p laparotomy / Jeanette Caprice procedure June 2014 Followed as outpt by [facility]   Chronic Iron deficiency anemia Hgb stable   BLINDNESS  GERD  OSTEOPOROSIS History of T10 compression fracture noted on November 2014 CT scan  DVT prophylaxis: Apixaban Code Status: Full Family Communication: Spoke with daughter Malicia via phone and updated on plans to dc to SNF Disposition Plan/Expected LOS: Stepdown  Consultants: Cardiology EP  Procedures: 7/10 TTE - EF 20% - diffuse hypokinesis - PA pressure 10mm Hg  Cultures:  None  Antibiotics: None  Objective: Blood pressure 131/90, pulse 90, temperature 98.3 F (36.8 C), temperature source Oral, resp. rate 18, height 5\' 8"  (1.727 m), weight 133 lb 2.5 oz (60.4 kg), last menstrual period 06/25/2006, SpO2 100.00%.  Intake/Output Summary (Last 24 hours) at 01/06/14 1320 Last data filed at 01/06/14 0538  Gross per 24 hour  Intake  250 ml  Output    950 ml  Net   -700 ml   Exam: General: No acute respiratory distress Lungs: Clear to auscultation bilaterally without wheezes or crackles -  pectus carinatum Cardiovascular: Regular rate and NSR, no peripheral edema Abdomen: Nontender, nondistended, soft, bowel sounds positive, no rebound, no ascites, no appreciable mass-colostomy bag with flatus but no stool Musculoskeletal: No significant cyanosis, clubbing of bilateral lower extremities-marked rheumatoid deformity both hands   Scheduled Meds:  Scheduled Meds: . allopurinol  100 mg Oral Daily  . amiodarone  400 mg Oral Daily  . apixaban  5 mg Oral BID  . carvedilol  6.25 mg Oral BID WC  . docusate sodium  100 mg Oral BID  . folic acid  1 mg Oral Daily  . furosemide  20 mg Oral Daily  . isoniazid  300 mg Oral Daily  . ketoconazole  1 application Topical BID  . loratadine  10 mg Oral Daily  . megestrol  800 mg Oral BID AC  . methotrexate  20 mg Subcutaneous Weekly  . pantoprazole  40 mg Oral Daily  . pyridOXINE  50 mg Oral Daily  . sodium chloride  3 mL Intravenous Q12H  . vitamin C  500 mg Oral Daily   Data Reviewed: Basic Metabolic Panel:  Recent Labs Lab 12/31/13 1116 01/01/14 0130 01/02/14 0250 01/04/14 0248 01/05/14 0042 01/06/14 0118  NA 142 141 141 140 140 139  K 4.5 4.1 4.3 4.1 4.0 4.3  CL 107 102 103 101 100 99  CO2 20 25 24 25 25 24   GLUCOSE 94 127* 119* 104* 184* 118*  BUN 17 17 16 18 21 23   CREATININE 1.00 1.02 1.15* 0.86 0.92 0.84  CALCIUM 8.6 8.6 8.7 9.1 9.2 8.8  MG 1.8  --   --   --  1.5  --    Liver Function Tests:  Recent Labs Lab 01/02/14 0250  AST 24  ALT 36*  ALKPHOS 89  BILITOT 0.4  PROT 6.7  ALBUMIN 3.2*   CBC:  Recent Labs Lab 12/31/13 1116 01/01/14 0300 01/02/14 0250 01/05/14 0042  WBC 6.3 5.6 6.1 7.8  NEUTROABS 4.8  --   --   --   HGB 9.1* 9.4* 9.5* 10.9*  HCT 38.2 28.9* 30.0* 34.4*  MCV 129.5* 93.8 93.5 93.0  PLT 227 256 289 329   Cardiac Enzymes:  Recent Labs Lab 12/31/13 1116 12/31/13 1950 01/01/14 0130 01/01/14 0641  TROPONINI <0.30 <0.30 <0.30 <0.30   BNP (last 3 results)  Recent Labs   12/31/13 1116 01/04/14 0245  PROBNP 6068.0* 3355.0*    Recent Results (from the past 240 hour(s))  MRSA PCR SCREENING     Status: None   Collection Time    12/31/13  4:25 PM      Result Value Ref Range Status   MRSA by PCR NEGATIVE  NEGATIVE Final   Comment:            The GeneXpert MRSA Assay (FDA     approved for NASAL specimens     only), is one component of a     comprehensive MRSA colonization     surveillance program. It is not     intended to diagnose MRSA     infection nor to guide or     monitor treatment for     MRSA infections.     Studies:  Recent x-ray studies have been reviewed in detail by the Attending Physician  Time  spent :  35 mins  Erin Hearing, ANP [facility] For Consults/Admissions - Flow Manager - [phone] Office  [phone] Pager [phone]  On-Call/Text Page:      [email or web address]      password [email or web address]  01/06/2014, 1:20 PM   LOS: 6 days   I have personally examined this patient and reviewed the entire database. I have reviewed the above note, made any necessary editorial changes, and agree with its content.  Cherene Altes, MD [facility]

## 2014-01-06 NOTE — Progress Notes (Signed)
Patient referred late this afternoon to CSW for SNF placement.  Will talk to patient and daughter in the a.m and initiate SNF search.  Fl2 initiated and will be placed on chart in the a.m.  Will activate bed search as soon as approval given by patient and/daughter.   SW Psychosocial Assessment to follow.  Lorie Phenix. [place], [place]

## 2014-01-06 NOTE — Progress Notes (Signed)
Report given to receiving RN. Patient in bed resting. No signs or symptoms of distress or discomfort noted.  

## 2014-01-07 LAB — BASIC METABOLIC PANEL
Anion gap: 13 (ref 5–15)
BUN: 18 mg/dL (ref 6–23)
CO2: 26 mEq/L (ref 19–32)
Calcium: 8.9 mg/dL (ref 8.4–10.5)
Chloride: 104 mEq/L (ref 96–112)
Creatinine, Ser: 0.81 mg/dL (ref 0.50–1.10)
GFR calc Af Amer: >90 mL/min (ref >90)
GFR calc non Af Amer: 79 mL/min — ABNORMAL LOW (ref >90)
Glucose, Bld: 97 mg/dL (ref 70–99)
Potassium: 4.5 mEq/L (ref 3.7–5.3)
Sodium: 143 mEq/L (ref 137–147)

## 2014-01-07 NOTE — Progress Notes (Signed)
Pt d/c'd to [facility] , report called

## 2014-01-07 NOTE — Clinical Social Work Placement (Signed)
     Clinical Social Work Department CLINICAL SOCIAL WORK PLACEMENT NOTE 01/07/2014  Patient:  [name], [name]  Account Number:  [id number] Admit date:  12/31/2013  Clinical Social Worker:  Butch Penny Aliviana Burdell, LCSWA  Date/time:  01/06/2014 03:35 PM  Clinical Social Work is seeking post-discharge placement for this patient at the following level of care:   SKILLED NURSING   (*CSW will update this form in Epic as items are completed)   01/06/2014  Patient/family provided with [place] Department of Clinical Social Works list of facilities offering this level of care within the geographic area requested by the patient (or if unable, by the patients family).  01/06/2014  Patient/family informed of their freedom to choose among providers that offer the needed level of care, that participate in Medicare, Medicaid or managed care program needed by the patient, have an available bed and are willing to accept the patient.  01/06/2014  Patient/family informed of [facility] ownership interest in [facility], as well as of the fact that they are under no obligation to receive care at this facility.  PASARR submitted to EDS on  PASARR number received on   FL2 transmitted to all facilities in geographic area requested by pt/family on  01/07/2014 FL2 transmitted to all facilities within larger geographic area on   Patient informed that his/her managed care company has contracts with or will negotiate with  certain facilities, including the following:   [facility]- Medicare Complete     Patient/family informed of bed offers received:  01/07/2014 [facility] Physician recommends and patient chooses bed at    Patient to be transferred to [facility] on  01/07/2014 Patient to be transferred to facility by [facility]) Patient and family notified of transfer on 01/07/2014 Name of family member notified:  Alm Bustard- Daughter  The  following physician request were entered in Epic: Physician Request  Please prepare priority discharge summary and prescriptions.  Please sign FL2.    Additional Comments: 01/07/14  OK per MD for d/c today to SNF for short term care. Patient is very pleased with this as she has been a prior resident of [facility] in the past. Her daughter was notified and denied any concerns or questions. She will go to facility to sign admission papers this morning. Nursing notified and CSW will sign off.

## 2014-01-07 NOTE — Evaluation (Signed)
Occupational Therapy Evaluation Patient Details Name: [name] MRN: [id number] DOB: 08/08/1955 Today's Date: 01/07/2014    History of Present Illness Adm 12/31/13 with SOB and found to be in aflutter. 01/05/14 underwent ablation. PMHx- Marfan's syndrome, RA, HTN, gout, blind, bil hearing loss with hearing aids, CVA, colostomy, bil TKR   Clinical Impression   Patient admitted with aflutter and underwent ablation. She has history of falls and blindness. She is currently deconditioned and very unsteady after hospitalized 7 days with very limited activity.  Patient was able to participate in very basic self care skills prior to this admission, but needed assistance for dressing, and for colostomy management.  She admittedly has reduced her activity level since colostomy last year, due to fear of "leaks."  Pt currently requires moderate assistance for basic ADL's due to decreased strength, and decreased activity tolerance.  Pt will benefit from skilled OT to increase her independence and safety with ADL and functional mobility while in the hospital, and she will benefit from skilled OT in SNF.        Follow Up Recommendations  SNF    Equipment Recommendations  None recommended by OT    Recommendations for Other Services       Precautions / Restrictions Precautions Precautions: Fall Precaution Comments: Patient blind since birth Restrictions Weight Bearing Restrictions: No      Mobility Bed Mobility Overal bed mobility: Modified Independent             General bed mobility comments: incr effort and time; HOB flat  Transfers Overall transfer level: Needs assistance Equipment used: Rolling walker (2 wheeled) Transfers: Sit to/from Stand Sit to Stand: Min assist;From elevated surface         General transfer comment: bed elevated to simulate home environment; pt unable to stand without some physical assist to extend knees and shift forward over her BOS    Balance  Overall balance assessment: Needs assistance Sitting-balance support: No upper extremity supported;Feet supported Sitting balance-Leahy Scale: Fair   Postural control: Posterior lean Standing balance support: Bilateral upper extremity supported;During functional activity Standing balance-Leahy Scale: Poor                              ADL Overall ADL's : Needs assistance/impaired Eating/Feeding: Independent   Grooming: Minimal assistance   Upper Body Bathing: Moderate assistance   Lower Body Bathing: Maximal assistance   Upper Body Dressing : Moderate assistance   Lower Body Dressing: Maximal assistance   Toilet Transfer: Moderate assistance Toilet Transfer Details (indicate cue type and reason): Ambulate with rolling walker Toileting- Clothing Manipulation and Hygiene: Moderate assistance;Sit to/from stand Toileting - Clothing Manipulation Details (indicate cue type and reason): Needs to stand from elevated surface     Functional mobility during ADLs: Moderate assistance;Rolling walker General ADL Comments: Unfamiliar environment, significant deconditioning     Vision                     Perception Perception Perception Tested?: No   Praxis Praxis Praxis tested?: Not tested    Pertinent Vitals/Pain Reports no pain.     Hand Dominance Right   Extremity/Trunk Assessment Upper Extremity Assessment Upper Extremity Assessment: RUE deficits/detail;LUE deficits/detail RUE Deficits / Details: Severe RA  deformity in bilateral hands / wrists.  Shoulder range limited to about 70 degrees of flexion   Lower Extremity Assessment Lower Extremity Assessment: Defer to PT evaluation   Cervical /  Trunk Assessment Cervical / Trunk Assessment: Kyphotic (scoliosis, profound RA)   Communication Communication Communication: HOH (Bilateral hearing aides)   Cognition Arousal/Alertness: Awake/alert Behavior During Therapy: WFL for tasks  assessed/performed Overall Cognitive Status: No family/caregiver present to determine baseline cognitive functioning Area of Impairment: Safety/judgement;Awareness;Problem solving         Safety/Judgement: Decreased awareness of safety Awareness: Emergent Problem Solving: Slow processing;Decreased initiation;Difficulty sequencing General Comments: Suspect patient is at baseline cognitively, however, no family members present to confirm this.  Slow processing, simple linear thinking present.  Fear and anxiety have restricted activity level since colostomy 1 year ago.     General Comments       Exercises       Shoulder Instructions      Home Living Family/patient expects to be discharged to:: Private residence Living Arrangements: Alone Available Help at Discharge: Personal care attendant Type of Home: Apartment Home Access: Level entry     Home Layout: One level               Home Equipment: [place] - 2 wheels;Bedside commode;Shower seat   Additional Comments: Patient lived alone but does not indicate that she was managing well on her own.  Daughter assisted with dressing, and colostomy management.        Prior Functioning/Environment Level of Independence: Needs assistance    ADL's / Homemaking Assistance Needed: Daughter assisted with dressing whenpatient had to go out, otherwise patient did not get fully dressed.  Daughter did colostomy management        OT Diagnosis: Generalized weakness   OT Problem List: Decreased strength;Impaired balance (sitting and/or standing);Cardiopulmonary status limiting activity;Decreased activity tolerance;Decreased knowledge of use of DME or AE   OT Treatment/Interventions: Self-care/ADL training;DME and/or AE instruction;Balance training;Therapeutic exercise;Patient/family education;Energy conservation    OT Goals(Current goals can be found in the care plan section) Acute Rehab OT Goals Patient Stated Goal: To get more  rehab OT Goal Formulation: With patient Time For Goal Achievement: 01/19/14 Potential to Achieve Goals: Good  OT Frequency: Min 2X/week   Barriers to D/C: Inaccessible home environment;Decreased caregiver support  Patient lives alone with intermittent support       Co-evaluation              End of Session Equipment Utilized During Treatment: Rolling walker  Activity Tolerance: Patient tolerated treatment well Patient left: in bed;with call bell/phone within reach   Time: 0920-0947 OT Time Calculation (min): 27 min Charges:  OT General Charges $OT Visit: 1 Procedure OT Treatments $Self Care/Home Management : 8-22 mins G-Codes:    Mariah Milling 2014-01-19, 10:00 AM

## 2014-01-07 NOTE — Clinical Social Work Psychosocial (Addendum)
    Clinical Social Work Department BRIEF PSYCHOSOCIAL ASSESSMENT 01/06/2014  Patient:  [name], [name]     Account Number:  [id number]     Admit date:  12/31/2013  Clinical Social Worker:  Iona Coach  Date/Time:  01/06/2014 01:05 PM  Referred by:  Physician  Date Referred:  01/06/2014 Referred for  SNF Placement   Other Referral:   Interview type:  Patient Other interview type:    PSYCHOSOCIAL DATA Living Status:  ALONE Admitted from facility:   Level of care:   Primary support name:  Fran Neiswonger   [phone] Primary support relationship to patient:  CHILD, ADULT Degree of support available:   Strong support    CURRENT CONCERNS Current Concerns  Post-Acute Placement   Other Concerns:    SOCIAL WORK ASSESSMENT / PLAN 58 year old female admitted from home and referred to [facility] for short term SNF.  Patient is blind and had hearing deficits. She wears hearing aides and can understand conversation.  Patient stated that she has been a resident of [facility] in the past and was very pleased with the care she received there. She requests return to this facility. FL2 intiated and will start bed search. CSW left message for admissions at [facility] to ascertain possible bed offer.   Assessment/plan status:  Psychosocial Support/Ongoing Assessment of Needs Other assessment/ plan:   Information/referral to community resources:   SNF list deferred by patient- she is blind and requests [facility].    PATIENT'S/FAMILY'S RESPONSE TO PLAN OF CARE: Patient is alert and oriented- she is very pleasant and communicated well with CSW. She stated that she had hoped to go home but recognizes that she needs some rehab first. Patient wants to return to [facility] as she was a prior resident there. CSW initiated bed search.  CSW will speak with her daughter about dc plan.

## 2014-01-16 ENCOUNTER — Encounter (HOSPITAL_COMMUNITY): Payer: Self-pay | Admitting: *Deleted

## 2014-01-21 ENCOUNTER — Ambulatory Visit: Payer: Medicare Other | Admitting: Family Medicine

## 2014-01-21 ENCOUNTER — Telehealth: Payer: Self-pay | Admitting: Family Medicine

## 2014-01-21 NOTE — Telephone Encounter (Signed)
Pt was on the schedule to see Dr. Sarajane Jews today, however she did not show. I called pt and left a voice message advising her to reschedule the appointment per doctor's recommendation.

## 2014-02-01 ENCOUNTER — Encounter: Payer: Self-pay | Admitting: Family Medicine

## 2014-02-01 ENCOUNTER — Ambulatory Visit (INDEPENDENT_AMBULATORY_CARE_PROVIDER_SITE_OTHER): Payer: Medicare Other | Admitting: Family Medicine

## 2014-02-01 VITALS — BP 124/86 | HR 87 | Temp 98.9°F

## 2014-02-01 DIAGNOSIS — I10 Essential (primary) hypertension: Secondary | ICD-10-CM

## 2014-02-01 DIAGNOSIS — M069 Rheumatoid arthritis, unspecified: Secondary | ICD-10-CM

## 2014-02-01 DIAGNOSIS — I4892 Unspecified atrial flutter: Secondary | ICD-10-CM

## 2014-02-01 MED ORDER — CARVEDILOL 6.25 MG PO TABS: 6.2500 mg | ORAL_TABLET | Freq: Two times a day (BID) | ORAL | Status: DC

## 2014-02-01 MED ORDER — EPINEPHRINE 0.3 MG/0.3ML IJ SOAJ: 0.3000 mg | Freq: Once | INTRAMUSCULAR | Status: DC

## 2014-02-01 MED ORDER — HYDROCODONE-ACETAMINOPHEN 10-325 MG PO TABS: 1.0000 | ORAL_TABLET | Freq: Four times a day (QID) | ORAL | Status: DC | PRN

## 2014-02-01 NOTE — Progress Notes (Signed)
   Subjective:    Patient ID: [name], female    DOB: 10/19/1955, 58 y.o.   MRN: [id number]  HPI Here to follow up a hospital stay from 12-31-13 to 01-07-14 for atrial flutter/fibrillation. She was admitted for SOB and tachycardia, and her ventricular rate was stabilized. She was started on Amiodarone and eventually was ablated. Since the ablation she has done very well. She denies any palpitations or chest pains or SOB. Since her DC she has been staying at [facility] for rehab, and she is set to go home tomorrow. Her BP has been stable. She was DC'd on Amiodarone and Eliquis for a planned period of 4 weeks.    Review of Systems  Constitutional: Negative.   Respiratory: Negative.   Cardiovascular: Negative.   Neurological: Negative.        Objective:   Physical Exam  Constitutional: She is oriented to person, place, and time. She appears well-developed and well-nourished.  Cardiovascular: Normal rate, regular rhythm, normal heart sounds and intact distal pulses.   Pulmonary/Chest: Effort normal and breath sounds normal.  Musculoskeletal: She exhibits no edema.  Neurological: She is alert and oriented to person, place, and time.          Assessment & Plan:  She is doing well. We will stop the Amiodarone and the Eliquis. Stay on Carvedilol. She will see Dr. Lovena Le on 02-10-14.

## 2014-02-01 NOTE — Progress Notes (Signed)
Pre visit review using our clinic review tool, if applicable. No additional management support is needed unless otherwise documented below in the visit note. 

## 2014-02-04 ENCOUNTER — Telehealth: Payer: Self-pay | Admitting: Family Medicine

## 2014-02-04 NOTE — Telephone Encounter (Signed)
Pt has an appt to see the heart doctor on 02/10/14, pt states she does not have transportation, pt states she contacted dr. Marya Amsler [place] office and informed them that she only has transportation on Mondays, they informed her to call dr. Sarajane Jews back because they did not have any available appointments for a Monday. Pt wants to know what other options does she have, and if dr. Sarajane Jews can set her up with another heart doctor and on a Monday.

## 2014-02-06 ENCOUNTER — Encounter (HOSPITAL_COMMUNITY): Payer: Self-pay | Admitting: Emergency Medicine

## 2014-02-06 ENCOUNTER — Emergency Department (HOSPITAL_COMMUNITY)
Admission: EM | Admit: 2014-02-06 | Discharge: 2014-02-07 | Disposition: A | Discharge status: Home / Self Care | Payer: Medicare Other | Attending: Emergency Medicine | Admitting: Emergency Medicine

## 2014-02-06 DIAGNOSIS — M545 Low back pain, unspecified: Secondary | ICD-10-CM | POA: Diagnosis not present

## 2014-02-06 DIAGNOSIS — Z8719 Personal history of other diseases of the digestive system: Secondary | ICD-10-CM | POA: Diagnosis not present

## 2014-02-06 DIAGNOSIS — Z8542 Personal history of malignant neoplasm of other parts of uterus: Secondary | ICD-10-CM | POA: Insufficient documentation

## 2014-02-06 DIAGNOSIS — Z8659 Personal history of other mental and behavioral disorders: Secondary | ICD-10-CM | POA: Diagnosis not present

## 2014-02-06 DIAGNOSIS — Z96659 Presence of unspecified artificial knee joint: Secondary | ICD-10-CM | POA: Diagnosis not present

## 2014-02-06 DIAGNOSIS — Z7901 Long term (current) use of anticoagulants: Secondary | ICD-10-CM | POA: Diagnosis not present

## 2014-02-06 DIAGNOSIS — Z79899 Other long term (current) drug therapy: Secondary | ICD-10-CM | POA: Diagnosis not present

## 2014-02-06 DIAGNOSIS — I10 Essential (primary) hypertension: Secondary | ICD-10-CM | POA: Diagnosis not present

## 2014-02-06 DIAGNOSIS — M810 Age-related osteoporosis without current pathological fracture: Secondary | ICD-10-CM | POA: Insufficient documentation

## 2014-02-06 DIAGNOSIS — Z8673 Personal history of transient ischemic attack (TIA), and cerebral infarction without residual deficits: Secondary | ICD-10-CM | POA: Diagnosis not present

## 2014-02-06 DIAGNOSIS — Z87891 Personal history of nicotine dependence: Secondary | ICD-10-CM | POA: Diagnosis not present

## 2014-02-06 DIAGNOSIS — M069 Rheumatoid arthritis, unspecified: Secondary | ICD-10-CM | POA: Diagnosis not present

## 2014-02-06 DIAGNOSIS — H548 Legal blindness, as defined in USA: Secondary | ICD-10-CM | POA: Diagnosis not present

## 2014-02-06 DIAGNOSIS — Z9889 Other specified postprocedural states: Secondary | ICD-10-CM | POA: Insufficient documentation

## 2014-02-06 DIAGNOSIS — Z792 Long term (current) use of antibiotics: Secondary | ICD-10-CM | POA: Diagnosis not present

## 2014-02-06 LAB — COMPREHENSIVE METABOLIC PANEL
ALT: 14 U/L (ref 0–35)
AST: 18 U/L (ref 0–37)
Albumin: 3.3 g/dL — ABNORMAL LOW (ref 3.5–5.2)
Alkaline Phosphatase: 81 U/L (ref 39–117)
Anion gap: 13 (ref 5–15)
BUN: 15 mg/dL (ref 6–23)
CO2: 22 mEq/L (ref 19–32)
Calcium: 9.1 mg/dL (ref 8.4–10.5)
Chloride: 104 mEq/L (ref 96–112)
Creatinine, Ser: 0.69 mg/dL (ref 0.50–1.10)
GFR calc Af Amer: >90 mL/min (ref >90)
GFR calc non Af Amer: >90 mL/min (ref >90)
Glucose, Bld: 112 mg/dL — ABNORMAL HIGH (ref 70–99)
Potassium: 4 mEq/L (ref 3.7–5.3)
Sodium: 139 mEq/L (ref 137–147)
Total Bilirubin: 0.4 mg/dL (ref 0.3–1.2)
Total Protein: 7.1 g/dL (ref 6.0–8.3)

## 2014-02-06 LAB — CBC WITH DIFFERENTIAL/PLATELET
Basophils Absolute: 0 10*3/uL (ref 0.0–0.1)
Basophils Relative: 0 % (ref 0–1)
Eosinophils Absolute: 0.2 10*3/uL (ref 0.0–0.7)
Eosinophils Relative: 3 % (ref 0–5)
HCT: 31.6 % — ABNORMAL LOW (ref 36.0–46.0)
Hemoglobin: 10.3 g/dL — ABNORMAL LOW (ref 12.0–15.0)
Lymphocytes Relative: 22 % (ref 12–46)
Lymphs Abs: 1.5 10*3/uL (ref 0.7–4.0)
MCH: 30.9 pg (ref 26.0–34.0)
MCHC: 32.6 g/dL (ref 30.0–36.0)
MCV: 94.9 fL (ref 78.0–100.0)
Monocytes Absolute: 0.4 10*3/uL (ref 0.1–1.0)
Monocytes Relative: 6 % (ref 3–12)
Neutro Abs: 4.8 10*3/uL (ref 1.7–7.7)
Neutrophils Relative %: 69 % (ref 43–77)
Platelets: 184 10*3/uL (ref 150–400)
RBC: 3.33 MIL/uL — ABNORMAL LOW (ref 3.87–5.11)
RDW: 15.9 % — ABNORMAL HIGH (ref 11.5–15.5)
WBC: 7 10*3/uL (ref 4.0–10.5)

## 2014-02-06 NOTE — ED Notes (Signed)
Bed: [facility] Expected date:  Expected time:  Means of arrival:  Comments: EMS - back pain x 3 days

## 2014-02-06 NOTE — ED Provider Notes (Signed)
CSN: [id number]     Arrival date & time 02/06/14  2033 History   First MD Initiated Contact with Patient 02/06/14 2124     Chief Complaint  Patient presents with  . Back Pain     (Consider location/radiation/quality/duration/timing/severity/associated sxs/prior Treatment) Patient is a 58 y.o. female presenting with back pain.  Back Pain Location:  Lumbar spine Quality:  Aching and stabbing Radiates to:  Does not radiate Pain severity:  Moderate Pain is:  Same all the time Onset quality:  Gradual Duration:  3 days Timing:  Constant Progression:  Unchanged Chronicity:  Recurrent Relieved by:  Nothing Worsened by:  Touching and twisting Associated symptoms: no abdominal pain, no bladder incontinence, no bowel incontinence, no chest pain, no dysuria, no fever, no numbness, no paresthesias, no tingling and no weakness     Past Medical History  Diagnosis Date  . Legally blind     since pt was a teenager  . Hearing aid worn     pt wears bilateral hearing aids  . Marfan's syndrome affecting skin     with scolosis  . Osteoporosis   . Hypertension   . Substance abuse     recovering alcoholic Z56 years   . Hernia 4/08  . 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 Chronic gout 2008  . Stroke 2/08  . Fibroid   . Status post bunionectomy 1/10    bilateral   . Diverticulitis of large intestine with perforation   . Small bowel obstruction due to adhesions   . Rheumatoid arthritis     sees Dr. Gavin Pound   . SBO (small bowel obstruction) 01/05/2013  . Atrial flutter    Past Surgical History  Procedure Laterality Date  . Bunionectomy Bilateral 1/10  . Total knee arthroplasty Bilateral 6/08  . Hernia repair    . Laparotomy N/A 11/27/2012    Procedure: EXPLORATORY LAPAROTOMY SIGMOID COLECTOMY, COLOSTOMY;  Surgeon: Madilyn Hook, DO;  Location: [facility];  Service: General;  Laterality: N/A;  . Colostomy  11/27/2012  . Laparoscopic abdominal exploration N/A 01/06/2013     Procedure: LAPAROSCOPIC ABDOMINAL EXPLORATION;  Surgeon: Adin Hector, MD;  Location: [facility];  Service: General;  Laterality: N/A;  . Laparoscopic lysis of adhesions N/A 01/06/2013    Procedure: LAPAROSCOPIC LYSIS OF ADHESIONS/ INTEROTOMY REPAIR;  Surgeon: Adin Hector, MD;  Location: [facility];  Service: General;  Laterality: N/A;  . Colon surgery      colectomy  . Ablation  01/04/14    atrial flutter ablation (2 circuits) by Dr Lovena Le   Family History  Problem Relation Age of Onset  . Heart attack Neg Hx    History  Substance Use Topics  . Smoking status: Former Smoker -- 1.00 packs/day    Types: Cigarettes    Quit date: 11/27/2012  . Smokeless tobacco: Never Used  . Alcohol Use: No     Comment: recovering alcoholic sober since [age]   OB History   Grav Para Term Preterm Abortions TAB SAB Ect Mult Living   1 1 1       1      Review of Systems  Constitutional: Negative for fever and chills.  HENT: Negative for congestion, rhinorrhea and sore throat.   Eyes: Negative for photophobia and visual disturbance.  Respiratory: Negative for cough and shortness of breath.   Cardiovascular: Negative for chest pain and leg swelling.  Gastrointestinal: Negative for nausea, vomiting, abdominal pain, diarrhea, constipation and bowel  incontinence.  Endocrine: Negative for polyphagia and polyuria.  Genitourinary: Negative for bladder incontinence, dysuria, flank pain, vaginal bleeding, vaginal discharge and enuresis.  Musculoskeletal: Positive for back pain. Negative for gait problem.  Skin: Negative for color change and rash.  Neurological: Negative for dizziness, tingling, syncope, weakness, light-headedness, numbness and paresthesias.  Hematological: Negative for adenopathy. Does not bruise/bleed easily.  All other systems reviewed and are negative.     Allergies  Review of patient's allergies indicates no known allergies.  Home Medications   Prior to Admission medications    Medication Sig Start Date End Date Taking? Authorizing Provider  albuterol (PROVENTIL HFA;VENTOLIN HFA) 108 (90 BASE) MCG/ACT inhaler Inhale 1 puff into the lungs every 4 (four) hours as needed for wheezing or shortness of breath. 12/29/13  Yes [name], MD  allopurinol (ZYLOPRIM) 100 MG tablet Take 100 mg by mouth daily.   Yes Historical Provider, MD  apixaban (ELIQUIS) 5 MG TABS tablet Take 5 mg by mouth 2 (two) times daily.   Yes Historical Provider, MD  Calcium Carbonate-Vitamin D (CALCIUM + D PO) Take 1,200 mg by mouth daily.   Yes Historical Provider, MD  carvedilol (COREG) 6.25 MG tablet Take 1 tablet (6.25 mg total) by mouth 2 (two) times daily with a meal. 02/01/14  Yes [name], MD  cetirizine (ZYRTEC) 10 MG tablet Take 10 mg by mouth daily.   Yes Historical Provider, MD  Cyanocobalamin (VITAMIN B 12 PO) Take 1 tablet by mouth daily.   Yes Historical Provider, MD  cyclobenzaprine (FLEXERIL) 10 MG tablet Take 1 tablet (10 mg total) by mouth 3 (three) times daily as needed for muscle spasms. 09/21/13  Yes [name], MD  docusate sodium (COLACE) 100 MG capsule Take 100 mg by mouth 2 (two) times daily.     Yes Historical Provider, MD  folic acid (FOLVITE) 1 MG tablet Take 1 mg by mouth daily.   Yes Historical Provider, MD  furosemide (LASIX) 20 MG tablet Take 1 tablet (20 mg total) by mouth daily. 01/06/14  Yes Samella Parr, NP  isoniazid (NYDRAZID) 300 MG tablet Take 300 mg by mouth daily.   Yes Historical Provider, MD  ketoconazole (NIZORAL) 2 % cream Apply 1 application topically 2 (two) times daily. 08/10/13  Yes [name], MD  megestrol (MEGACE ES) 625 MG/5ML suspension Take 5 mL (625 mg total) by mouth 2 (two) times daily before a meal. 02/09/13  Yes [name], MD  methotrexate 25 MG/ML injection Inject 20 mg into the skin once a week. Take on Mondays   Yes Historical Provider, MD  Multiple Vitamins-Minerals (MULTIVITAMIN PO) Take 1 tablet by mouth daily.    Yes  Historical Provider, MD  NEXIUM 40 MG capsule take 1 capsule by mouth twice a day 11/20/13  Yes [name], MD  pyridOXINE (VITAMIN B-6) 50 MG tablet Take 50 mg by mouth daily.   Yes Historical Provider, MD  risedronate (ACTONEL) 150 MG tablet Take 150 mg by mouth every 30 (thirty) days. with water on empty stomach, nothing by mouth or lie down for next 30 minutes.   Yes Historical Provider, MD  traMADol (ULTRAM) 50 MG tablet Take 50 mg by mouth every 6 (six) hours as needed for moderate pain.   Yes Historical Provider, MD  vitamin C (ASCORBIC ACID) 500 MG tablet Take 500 mg by mouth daily.   Yes Historical Provider, MD  diazepam (VALIUM) 5 MG tablet Take 1 tablet (5  mg total) by mouth every 8 (eight) hours as needed (muscle spasm). 02/07/14   Kalman Drape, MD  EPINEPHrine (EPIPEN 2-PAK) 0.3 mg/0.3 mL IJ SOAJ injection Inject 0.3 mLs (0.3 mg total) into the muscle once. 02/01/14   [name], MD  HYDROcodone-acetaminophen (NORCO) 10-325 MG per tablet Take 1 tablet by mouth every 6 (six) hours as needed for moderate pain. 02/01/14   [name], MD   BP 136/63  Pulse 88  Temp(Src) 97.8 F (36.6 C) (Oral)  Resp 17  SpO2 94%  LMP 06/25/2006 Physical Exam  Vitals reviewed. Constitutional: She is oriented to person, place, and time. She appears well-developed and well-nourished.  HENT:  Head: Normocephalic and atraumatic.  Right Ear: External ear normal.  Left Ear: External ear normal.  Eyes: Conjunctivae and EOM are normal. Pupils are equal, round, and reactive to light.  Neck: Normal range of motion. Neck supple.  Cardiovascular: Normal rate, regular rhythm, normal heart sounds and intact distal pulses.   Pulmonary/Chest: Effort normal and breath sounds normal.  Abdominal: Soft. Bowel sounds are normal. There is no tenderness.  Musculoskeletal: Normal range of motion.       Cervical back: Normal.       Thoracic back: Normal.       Lumbar back: She exhibits tenderness (reproduction of  symptoms). She exhibits no bony tenderness.  Neurological: She is alert and oriented to person, place, and time.  Skin: Skin is warm and dry.    ED Course  Procedures (including critical care time) Labs Review Labs Reviewed  CBC WITH DIFFERENTIAL - Abnormal; Notable for the following:    RBC 3.33 (*)    Hemoglobin 10.3 (*)    HCT 31.6 (*)    RDW 15.9 (*)    All other components within normal limits  COMPREHENSIVE METABOLIC PANEL - Abnormal; Notable for the following:    Glucose, Bld 112 (*)    Albumin 3.3 (*)    All other components within normal limits  URINALYSIS, ROUTINE W REFLEX MICROSCOPIC    Imaging Review Ct Cta Abd/pel W/cm &/or W/o Cm  02/07/2014   CLINICAL DATA:  Lower back pain for 3 days.  EXAM: CTA ABDOMEN AND PELVIS WITH CONTRAST  TECHNIQUE: Multidetector CT imaging of the abdomen and pelvis was performed using the standard protocol during bolus administration of intravenous contrast. Multiplanar reconstructed images and MIPs were obtained and reviewed to evaluate the vascular anatomy.  CONTRAST:  155mL OMNIPAQUE IOHEXOL 350 MG/ML SOLN  COMPARISON:  CT of the abdomen and pelvis performed 02/25/2013  FINDINGS: The visualized lung bases are clear. Scarring is noted along the inferior aspect of both breasts.  There is no evidence of aortic dissection. No aneurysmal dilatation is seen. Scattered calcification is seen along the abdominal aorta and its branches, including at the origins of the celiac trunk, superior mesenteric artery and bilateral renal arteries. Minimal calcification is noted along the inferior mesenteric artery. The branches of the abdominal aorta remain patent, without significant luminal narrowing seen.  The IVC is grossly unremarkable in appearance. The abdominal aorta is somewhat tortuous in nature. The common, external and internal iliac arteries remain patent bilaterally. The common femoral arteries are grossly unremarkable, aside from minimal right-sided  calcific atherosclerotic disease.  A 2.1 cm cyst is again noted within the medial right hepatic lobe. Additional smaller hypodensities are nonspecific but may reflect small cysts. A tiny 5 mm hyperdensity at the hepatic dome is also nonspecific in appearance. The gallbladder is  within normal limits. There is distention of the pancreatic duct to 6 mm in maximal diameter, grossly stable from the prior study and of uncertain significance. The pancreas and adrenal glands are otherwise unremarkable.  There is a 1.4 cm nodule at the posterior aspect of the left kidney, of somewhat increased attenuation. This appears to have increased in size from the prior CTs, and is new from 2007. Renal ultrasound would be helpful for further evaluation, to exclude a solid mass. A few bilateral renal cysts are seen. The kidneys are otherwise unremarkable. There is no evidence of hydronephrosis. No renal or ureteral stones are seen. No perinephric stranding is appreciated.  No free fluid is identified. The small bowel is unremarkable in appearance. The stomach is within normal limits. No acute vascular abnormalities are seen.  The appendix is normal in caliber and contains air, without evidence for appendicitis. The transverse colon is redundant. The colon is largely filled with stool and is unremarkable in appearance.  The bladder is mildly distended and grossly unremarkable. Scattered fibroids are noted within the uterus. No suspicious adnexal masses are seen. The ovaries appear relatively symmetric. No inguinal lymphadenopathy is seen.  No acute osseous abnormalities are identified. Degenerative change is noted at both hips. Prominent left convex lumbar scoliosis is noted, with associated degenerative change.  Review of the MIP images confirms the above findings.  IMPRESSION: 1. No evidence of aortic dissection.  No aneurysmal dilatation seen. 2. Scattered calcific atherosclerotic disease noted along the abdominal aorta and its  branches, without evidence of significant luminal narrowing. 3. 1.4 cm nodule at the posterior aspect of the left kidney demonstrates somewhat increased attenuation, and appears to be increased in size from prior CTs. It is new from 2007. Renal ultrasound would be helpful for further evaluation when deemed clinically appropriate, to exclude a solid mass. Malignancy cannot be excluded. 4. Likely hepatic cysts and tiny 5 mm nonspecific hyperdensity at the hepatic dome. Stable distention of the pancreatic duct to 6 mm in maximum diameter, of uncertain significance. No definite evidence of distal obstruction. 5. Few bilateral renal cysts noted.  Number fibroid uterus seen. 6. Prominent left convex lumbar scoliosis, with associated degenerative change. Degenerative change noted at both hips.   Electronically Signed   By: Garald Balding M.D.   On: 02/07/2014 02:35     EKG Interpretation None      MDM   Final diagnoses:  Bilateral low back pain without sciatica    58 y.o. female  with pertinent PMH of prior SBO and diverticulitis with colectomy, marfans syndrome presents with musculoskeletal appearing back pain, however ho marfans and distended abd with multiple herniations makes [place] less feasible.  Physical exam as above with paraspinal R sided tenderness.  UA unremarkable.  Pt care to Dr. Sharol Given pending CT scan with plan to DC home if unremarkable.    Labs and imaging as above reviewed.   1. Bilateral low back pain without sciatica         Debby Freiberg, MD 02/07/14 1157

## 2014-02-06 NOTE — ED Notes (Signed)
Per EMS  , pt. From home with complaint of lower back pain x 3 days, pt. Has was in [employer] and was discharged 3 days ago. Pt is blind , denies SOB, denies chest pain , no N/V. Oriented x2.

## 2014-02-07 ENCOUNTER — Emergency Department (HOSPITAL_COMMUNITY): Payer: Medicare Other

## 2014-02-07 DIAGNOSIS — M545 Low back pain, unspecified: Secondary | ICD-10-CM | POA: Diagnosis not present

## 2014-02-07 LAB — URINALYSIS, ROUTINE W REFLEX MICROSCOPIC
Bilirubin Urine: NEGATIVE
Glucose, UA: NEGATIVE mg/dL
Hgb urine dipstick: NEGATIVE
Ketones, ur: NEGATIVE mg/dL
Leukocytes, UA: NEGATIVE
Nitrite: NEGATIVE
Protein, ur: NEGATIVE mg/dL
Specific Gravity, Urine: 1.018 (ref 1.005–1.030)
Urobilinogen, UA: 1 mg/dL (ref 0.0–1.0)
pH: 6 (ref 5.0–8.0)

## 2014-02-07 MED ORDER — METHOCARBAMOL 750 MG PO TABS: 750.0000 mg | ORAL_TABLET | Freq: Four times a day (QID) | ORAL | Status: DC | PRN

## 2014-02-07 MED ORDER — IOHEXOL 350 MG/ML SOLN
100.0000 mL | Freq: Once | INTRAVENOUS | Status: AC | PRN
  Administered 2014-02-07: 100 mL via INTRAVENOUS

## 2014-02-07 MED ORDER — DIAZEPAM 5 MG PO TABS: 5.0000 mg | ORAL_TABLET | Freq: Three times a day (TID) | ORAL | Status: DC | PRN

## 2014-02-07 NOTE — ED Provider Notes (Signed)
Results for orders placed during the hospital encounter of 02/06/14  URINALYSIS, ROUTINE W REFLEX MICROSCOPIC      Result Value Ref Range   Color, Urine YELLOW  YELLOW   APPearance CLEAR  CLEAR   Specific Gravity, Urine 1.018  1.005 - 1.030   pH 6.0  5.0 - 8.0   Glucose, UA NEGATIVE  NEGATIVE mg/dL   Hgb urine dipstick NEGATIVE  NEGATIVE   Bilirubin Urine NEGATIVE  NEGATIVE   Ketones, ur NEGATIVE  NEGATIVE mg/dL   Protein, ur NEGATIVE  NEGATIVE mg/dL   Urobilinogen, UA 1.0  0.0 - 1.0 mg/dL   Nitrite NEGATIVE  NEGATIVE   Leukocytes, UA NEGATIVE  NEGATIVE  CBC WITH DIFFERENTIAL      Result Value Ref Range   WBC 7.0  4.0 - 10.5 K/uL   RBC 3.33 (*) 3.87 - 5.11 MIL/uL   Hemoglobin 10.3 (*) 12.0 - 15.0 g/dL   HCT 31.6 (*) 36.0 - 46.0 %   MCV 94.9  78.0 - 100.0 fL   MCH 30.9  26.0 - 34.0 pg   MCHC 32.6  30.0 - 36.0 g/dL   RDW 15.9 (*) 11.5 - 15.5 %   Platelets 184  150 - 400 K/uL   Neutrophils Relative % 69  43 - 77 %   Neutro Abs 4.8  1.7 - 7.7 K/uL   Lymphocytes Relative 22  12 - 46 %   Lymphs Abs 1.5  0.7 - 4.0 K/uL   Monocytes Relative 6  3 - 12 %   Monocytes Absolute 0.4  0.1 - 1.0 K/uL   Eosinophils Relative 3  0 - 5 %   Eosinophils Absolute 0.2  0.0 - 0.7 K/uL   Basophils Relative 0  0 - 1 %   Basophils Absolute 0.0  0.0 - 0.1 K/uL  COMPREHENSIVE METABOLIC PANEL      Result Value Ref Range   Sodium 139  137 - 147 mEq/L   Potassium 4.0  3.7 - 5.3 mEq/L   Chloride 104  96 - 112 mEq/L   CO2 22  19 - 32 mEq/L   Glucose, Bld 112 (*) 70 - 99 mg/dL   BUN 15  6 - 23 mg/dL   Creatinine, Ser 0.69  0.50 - 1.10 mg/dL   Calcium 9.1  8.4 - 10.5 mg/dL   Total Protein 7.1  6.0 - 8.3 g/dL   Albumin 3.3 (*) 3.5 - 5.2 g/dL   AST 18  0 - 37 U/L   ALT 14  0 - 35 U/L   Alkaline Phosphatase 81  39 - 117 U/L   Total Bilirubin 0.4  0.3 - 1.2 mg/dL   GFR calc non Af Amer >90  >90 mL/min   GFR calc Af Amer >90  >90 mL/min   Anion gap 13  5 - 15   Ct Cta Abd/pel W/cm &/or W/o  Cm  02/07/2014   CLINICAL DATA:  Lower back pain for 3 days.  EXAM: CTA ABDOMEN AND PELVIS WITH CONTRAST  TECHNIQUE: Multidetector CT imaging of the abdomen and pelvis was performed using the standard protocol during bolus administration of intravenous contrast. Multiplanar reconstructed images and MIPs were obtained and reviewed to evaluate the vascular anatomy.  CONTRAST:  157mL OMNIPAQUE IOHEXOL 350 MG/ML SOLN  COMPARISON:  CT of the abdomen and pelvis performed 02/25/2013  FINDINGS: The visualized lung bases are clear. Scarring is noted along the inferior aspect of both breasts.  There is no evidence of aortic dissection.  No aneurysmal dilatation is seen. Scattered calcification is seen along the abdominal aorta and its branches, including at the origins of the celiac trunk, superior mesenteric artery and bilateral renal arteries. Minimal calcification is noted along the inferior mesenteric artery. The branches of the abdominal aorta remain patent, without significant luminal narrowing seen.  The IVC is grossly unremarkable in appearance. The abdominal aorta is somewhat tortuous in nature. The common, external and internal iliac arteries remain patent bilaterally. The common femoral arteries are grossly unremarkable, aside from minimal right-sided calcific atherosclerotic disease.  A 2.1 cm cyst is again noted within the medial right hepatic lobe. Additional smaller hypodensities are nonspecific but may reflect small cysts. A tiny 5 mm hyperdensity at the hepatic dome is also nonspecific in appearance. The gallbladder is within normal limits. There is distention of the pancreatic duct to 6 mm in maximal diameter, grossly stable from the prior study and of uncertain significance. The pancreas and adrenal glands are otherwise unremarkable.  There is a 1.4 cm nodule at the posterior aspect of the left kidney, of somewhat increased attenuation. This appears to have increased in size from the prior CTs, and is new  from 2007. Renal ultrasound would be helpful for further evaluation, to exclude a solid mass. A few bilateral renal cysts are seen. The kidneys are otherwise unremarkable. There is no evidence of hydronephrosis. No renal or ureteral stones are seen. No perinephric stranding is appreciated.  No free fluid is identified. The small bowel is unremarkable in appearance. The stomach is within normal limits. No acute vascular abnormalities are seen.  The appendix is normal in caliber and contains air, without evidence for appendicitis. The transverse colon is redundant. The colon is largely filled with stool and is unremarkable in appearance.  The bladder is mildly distended and grossly unremarkable. Scattered fibroids are noted within the uterus. No suspicious adnexal masses are seen. The ovaries appear relatively symmetric. No inguinal lymphadenopathy is seen.  No acute osseous abnormalities are identified. Degenerative change is noted at both hips. Prominent left convex lumbar scoliosis is noted, with associated degenerative change.  Review of the MIP images confirms the above findings.  IMPRESSION: 1. No evidence of aortic dissection.  No aneurysmal dilatation seen. 2. Scattered calcific atherosclerotic disease noted along the abdominal aorta and its branches, without evidence of significant luminal narrowing. 3. 1.4 cm nodule at the posterior aspect of the left kidney demonstrates somewhat increased attenuation, and appears to be increased in size from prior CTs. It is new from 2007. Renal ultrasound would be helpful for further evaluation when deemed clinically appropriate, to exclude a solid mass. Malignancy cannot be excluded. 4. Likely hepatic cysts and tiny 5 mm nonspecific hyperdensity at the hepatic dome. Stable distention of the pancreatic duct to 6 mm in maximum diameter, of uncertain significance. No definite evidence of distal obstruction. 5. Few bilateral renal cysts noted.  Number fibroid uterus seen. 6.  Prominent left convex lumbar scoliosis, with associated degenerative change. Degenerative change noted at both hips.   Electronically Signed   By: Garald Balding M.D.   On: 02/07/2014 02:35    Pt without signs of dissection.  She is already on vicodin and flexeril.  Attempted to add robaxin, but she reports diarrhea with robaxin in past.  Pt was concerned for bowel obstruction, but no signs on ct scan.  Kalman Drape, MD 02/07/14 [phone]

## 2014-02-07 NOTE — Discharge Instructions (Signed)
Your workup today did not show a serious cause for your back pain.  Continue taking your Norco as prescribed by Dr Sarajane Jews.  Start taking Valium as prescribed.  Return to the ER for worsening pain, numbness or weakness to extremities, incontinence, or other new concerning symptoms.   Back Pain, Adult Low back pain is very common. About 1 in 5 people have back pain.The cause of low back pain is rarely dangerous. The pain often gets better over time.About half of people with a sudden onset of back pain feel better in just 2 weeks. About 8 in 10 people feel better by 6 weeks.  CAUSES Some common causes of back pain include:  Strain of the muscles or ligaments supporting the spine.  Wear and tear (degeneration) of the spinal discs.  Arthritis.  Direct injury to the back. DIAGNOSIS Most of the time, the direct cause of low back pain is not known.However, back pain can be treated effectively even when the exact cause of the pain is unknown.Answering your caregiver's questions about your overall health and symptoms is one of the most accurate ways to make sure the cause of your pain is not dangerous. If your caregiver needs more information, he or she may order lab work or imaging tests (X-rays or MRIs).However, even if imaging tests show changes in your back, this usually does not require surgery. HOME CARE INSTRUCTIONS For many people, back pain returns.Since low back pain is rarely dangerous, it is often a condition that people can learn to [facility] their own.   Remain active. It is stressful on the back to sit or stand in one place. Do not sit, drive, or stand in one place for more than 30 minutes at a time. Take short walks on level surfaces as soon as pain allows.Try to increase the length of time you walk each day.  Do not stay in bed.Resting more than 1 or 2 days can delay your recovery.  Do not avoid exercise or work.Your body is made to move.It is not dangerous to be active, even  though your back may hurt.Your back will likely heal faster if you return to being active before your pain is gone.  Pay attention to your body when you bend and lift. Many people have less discomfortwhen lifting if they bend their knees, keep the load close to their bodies,and avoid twisting. Often, the most comfortable positions are those that put less stress on your recovering back.  Find a comfortable position to sleep. Use a firm mattress and lie on your side with your knees slightly bent. If you lie on your back, put a pillow under your knees.  Only take over-the-counter or prescription medicines as directed by your caregiver. Over-the-counter medicines to reduce pain and inflammation are often the most helpful.Your caregiver may prescribe muscle relaxant drugs.These medicines help dull your pain so you can more quickly return to your normal activities and healthy exercise.  Put ice on the injured area.  Put ice in a plastic bag.  Place a towel between your skin and the bag.  Leave the ice on for 15-20 minutes, 03-04 times a day for the first 2 to 3 days. After that, ice and heat may be alternated to reduce pain and spasms.  Ask your caregiver about trying back exercises and gentle massage. This may be of some benefit.  Avoid feeling anxious or stressed.Stress increases muscle tension and can worsen back pain.It is important to recognize when you are anxious or stressed  and learn ways to manage it.Exercise is a great option. SEEK MEDICAL CARE IF:  You have pain that is not relieved with rest or medicine.  You have pain that does not improve in 1 week.  You have new symptoms.  You are generally not feeling well. SEEK IMMEDIATE MEDICAL CARE IF:   You have pain that radiates from your back into your legs.  You develop new bowel or bladder control problems.  You have unusual weakness or numbness in your arms or legs.  You develop nausea or vomiting.  You develop  abdominal pain.  You feel faint. Document Released: 06/11/2005 Document Revised: 12/11/2011 Document Reviewed: 10/13/2013 [facility] Patient Information 2015 [name], [place]. This information is not intended to replace advice given to you by your health care provider. Make sure you discuss any questions you have with your health care provider.  Heat Therapy Heat therapy can help make painful, stiff muscles and joints feel better. Do not use heat on new injuries. Wait at least 48 hours after an injury to use heat. Do not use heat when you have aches or pains right after an activity. If you still have pain 3 hours after stopping the activity, then you may use heat. HOME CARE Wet heat pack  Soak a clean towel in warm water. Squeeze out the extra water.  Put the warm, wet towel in a plastic bag.  Place a thin, dry towel between your skin and the bag.  Put the heat pack on the area for 5 minutes, and check your skin. Your skin may be pink, but it should not be red.  Leave the heat pack on the area for 15 to 30 minutes.  Repeat this every 2 to 4 hours while awake. Do not use heat while you are sleeping. Warm water bath  Fill a tub with warm water.  Place the affected body part in the tub.  Soak the area for 20 to 40 minutes.  Repeat as needed. Hot water bottle  Fill the water bottle half full with hot water.  Press out the extra air. Close the cap tightly.  Place a dry towel between your skin and the bottle.  Put the bottle on the area for 5 minutes, and check your skin. Your skin may be pink, but it should not be red.  Leave the bottle on the area for 15 to 30 minutes.  Repeat this every 2 to 4 hours while awake. Electric heating pad  Place a dry towel between your skin and the heating pad.  Set the heating pad on low heat.  Put the heating pad on the area for 10 minutes, and check your skin. Your skin may be pink, but it should not be red.  Leave the heating pad on the  area for 20 to 40 minutes.  Repeat this every 2 to 4 hours while awake.  Do not lie on the heating pad.  Do not fall asleep while using the heating pad.  Do not use the heating pad near water. GET HELP RIGHT AWAY IF:  You get blisters or red skin.  Your skin is puffy (swollen), or you lose feeling (numbness) in the affected area.  You have any new problems.  Your problems are getting worse.  You have any questions or concerns. If you have any problems, stop using heat therapy until you see your doctor. MAKE SURE YOU:  Understand these instructions.  Will watch your condition.  Will get help right away if  you are not doing well or get worse. Document Released: 09/03/2011 Document Reviewed: 08/04/2013 [facility] Patient Information 2015 [place]. This information is not intended to replace advice given to you by your health care provider. Make sure you discuss any questions you have with your health care provider.  Musculoskeletal Pain Musculoskeletal pain is muscle and boney aches and pains. These pains can occur in any part of the body. Your caregiver may treat you without knowing the cause of the pain. They may treat you if blood or urine tests, X-rays, and other tests were normal.  CAUSES There is often not a definite cause or reason for these pains. These pains may be caused by a type of germ (virus). The discomfort may also come from overuse. Overuse includes working out too hard when your body is not fit. Boney aches also come from weather changes. Bone is sensitive to atmospheric pressure changes. HOME CARE INSTRUCTIONS   Ask when your test results will be ready. Make sure you get your test results.  Only take over-the-counter or prescription medicines for pain, discomfort, or fever as directed by your caregiver. If you were given medications for your condition, do not drive, operate machinery or power tools, or sign legal documents for 24 hours. Do not drink  alcohol. Do not take sleeping pills or other medications that may interfere with treatment.  Continue all activities unless the activities cause more pain. When the pain lessens, slowly resume normal activities. Gradually increase the intensity and duration of the activities or exercise.  During periods of severe pain, bed rest may be helpful. Lay or sit in any position that is comfortable.  Putting ice on the injured area.  Put ice in a bag.  Place a towel between your skin and the bag.  Leave the ice on for 15 to 20 minutes, 3 to 4 times a day.  Follow up with your caregiver for continued problems and no reason can be found for the pain. If the pain becomes worse or does not go away, it may be necessary to repeat tests or do additional testing. Your caregiver may need to look further for a possible cause. SEEK IMMEDIATE MEDICAL CARE IF:  You have pain that is getting worse and is not relieved by medications.  You develop chest pain that is associated with shortness or breath, sweating, feeling sick to your stomach (nauseous), or throw up (vomit).  Your pain becomes localized to the abdomen.  You develop any new symptoms that seem different or that concern you. MAKE SURE YOU:   Understand these instructions.  Will watch your condition.  Will get help right away if you are not doing well or get worse. Document Released: 06/11/2005 Document Revised: 09/03/2011 Document Reviewed: 02/13/2013 [facility] Patient Information 2015 [place], [place]. This information is not intended to replace advice given to you by your health care provider. Make sure you discuss any questions you have with your health care provider.

## 2014-02-07 NOTE — ED Notes (Signed)
Patient transported to CT 

## 2014-02-08 NOTE — Telephone Encounter (Signed)
In that case I suggest the cardiology office set her up to see someone other than Dr. Lovena Le on a Monday

## 2014-02-08 NOTE — Telephone Encounter (Signed)
I spoke with pt and she has worked out the transportation for Dr. Lovena Le. She now needs a referral for general surgery to evaluate colostomy.

## 2014-02-09 ENCOUNTER — Telehealth: Payer: Self-pay | Admitting: Family Medicine

## 2014-02-09 ENCOUNTER — Other Ambulatory Visit: Payer: Self-pay

## 2014-02-09 DIAGNOSIS — Z433 Encounter for attention to colostomy: Secondary | ICD-10-CM

## 2014-02-09 NOTE — Telephone Encounter (Signed)
I left a voice message with the below information.

## 2014-02-09 NOTE — Telephone Encounter (Signed)
Referral was done  

## 2014-02-09 NOTE — Telephone Encounter (Signed)
Pt states she needs the referral for a second opinion about her colostomy reversal.  Dr gross states pt needs specialist at [facility] that specializes in Marfan's syndrome

## 2014-02-09 NOTE — Telephone Encounter (Signed)
She just saw Dr. Michael Boston in June to evaluate her colostomy site, so she should not require another referral to see him about a possible reversal surgery. She can make her own appt to see him again

## 2014-02-10 ENCOUNTER — Ambulatory Visit (INDEPENDENT_AMBULATORY_CARE_PROVIDER_SITE_OTHER): Payer: Medicare Other | Admitting: Internal Medicine

## 2014-02-10 ENCOUNTER — Telehealth: Payer: Self-pay | Admitting: Family Medicine

## 2014-02-10 ENCOUNTER — Encounter: Payer: Self-pay | Admitting: Internal Medicine

## 2014-02-10 VITALS — BP 116/72 | HR 94 | Ht 68.0 in

## 2014-02-10 DIAGNOSIS — I10 Essential (primary) hypertension: Secondary | ICD-10-CM

## 2014-02-10 DIAGNOSIS — I4892 Unspecified atrial flutter: Secondary | ICD-10-CM

## 2014-02-10 NOTE — Assessment & Plan Note (Signed)
She is maintaining NSR after ablation of two different flutters. I will see her back on an as needed basis.

## 2014-02-10 NOTE — Progress Notes (Signed)
HPI [name] returns today for followup. She is a pleasant 58 yo woman with atrial flutter who underwent catheter ablation of typical atrial flutter as well as right atrial free wall atrial flutter using the crista terminalis. Since her ablation approx. One month ago, she has been stable. She would like to come off of her coreg.  No Known Allergies   Current Outpatient Prescriptions  Medication Sig Dispense Refill  . albuterol (PROVENTIL HFA;VENTOLIN HFA) 108 (90 BASE) MCG/ACT inhaler Inhale 1 puff into the lungs every 4 (four) hours as needed for wheezing or shortness of breath.  1 Inhaler  11  . allopurinol (ZYLOPRIM) 100 MG tablet Take 100 mg by mouth daily.      . Calcium Carbonate-Vitamin D (CALCIUM + D PO) Take 1,200 mg by mouth daily.      . carvedilol (COREG) 6.25 MG tablet Take 1 tablet (6.25 mg total) by mouth 2 (two) times daily with a meal.  60 tablet  11  . cetirizine (ZYRTEC) 10 MG tablet Take 10 mg by mouth daily.      . Cyanocobalamin (VITAMIN B 12 PO) Take 1 tablet by mouth daily.      . cyclobenzaprine (FLEXERIL) 10 MG tablet Take 1 tablet (10 mg total) by mouth 3 (three) times daily as needed for muscle spasms.  270 tablet  3  . diazepam (VALIUM) 5 MG tablet Take 1 tablet (5 mg total) by mouth every 8 (eight) hours as needed (muscle spasm).  15 tablet  0  . EPINEPHrine (EPIPEN 2-PAK) 0.3 mg/0.3 mL IJ SOAJ injection Inject 0.3 mLs (0.3 mg total) into the muscle once.  2 Device  5  . folic acid (FOLVITE) 1 MG tablet Take 1 mg by mouth daily.      Marland Kitchen HYDROcodone-acetaminophen (NORCO) 10-325 MG per tablet Take 1 tablet by mouth every 6 (six) hours as needed for moderate pain.  120 tablet  0  . isoniazid (NYDRAZID) 300 MG tablet Take 300 mg by mouth daily.      Marland Kitchen ketoconazole (NIZORAL) 2 % cream Apply 1 application topically 2 (two) times daily.  30 g  5  . megestrol (MEGACE ES) 625 MG/5ML suspension Take 5 mL (625 mg total) by mouth 2 (two) times daily before a meal.   150 mL  5  . methotrexate 25 MG/ML injection Inject 20 mg into the skin once a week. Take on Mondays      . metoprolol succinate (TOPROL-XL) 25 MG 24 hr tablet       . Multiple Vitamins-Minerals (MULTIVITAMIN PO) Take 1 tablet by mouth daily.       Marland Kitchen NEXIUM 40 MG capsule take 1 capsule by mouth twice a day  60 capsule  3  . pyridOXINE (VITAMIN B-6) 50 MG tablet Take 50 mg by mouth daily.      . risedronate (ACTONEL) 150 MG tablet Take 150 mg by mouth every 30 (thirty) days. with water on empty stomach, nothing by mouth or lie down for next 30 minutes.      . traMADol (ULTRAM) 50 MG tablet Take 50 mg by mouth every 6 (six) hours as needed for moderate pain.      . vitamin C (ASCORBIC ACID) 500 MG tablet Take 500 mg by mouth daily.       No current facility-administered medications for this visit.     Past Medical History  Diagnosis Date  . Legally blind     since pt  was a teenager  . Hearing aid worn     pt wears bilateral hearing aids  . Marfan's syndrome affecting skin     with scolosis  . Osteoporosis   . Hypertension   . Substance abuse     recovering alcoholic I62 years   . Hernia 4/08  . 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 Chronic gout 2008  . Stroke 2/08  . Fibroid   . Status post bunionectomy 1/10    bilateral   . Diverticulitis of large intestine with perforation   . Small bowel obstruction due to adhesions   . Rheumatoid arthritis     sees Dr. Gavin Pound   . SBO (small bowel obstruction) 01/05/2013  . Atrial flutter     ROS:   All systems reviewed and negative except as noted in the HPI.   Past Surgical History  Procedure Laterality Date  . Bunionectomy Bilateral 1/10  . Total knee arthroplasty Bilateral 6/08  . Hernia repair    . Laparotomy N/A 11/27/2012    Procedure: EXPLORATORY LAPAROTOMY SIGMOID COLECTOMY, COLOSTOMY;  Surgeon: Madilyn Hook, DO;  Location: [facility];  Service: General;  Laterality: N/A;  . Colostomy  11/27/2012  .  Laparoscopic abdominal exploration N/A 01/06/2013    Procedure: LAPAROSCOPIC ABDOMINAL EXPLORATION;  Surgeon: Adin Hector, MD;  Location: [facility];  Service: General;  Laterality: N/A;  . Laparoscopic lysis of adhesions N/A 01/06/2013    Procedure: LAPAROSCOPIC LYSIS OF ADHESIONS/ INTEROTOMY REPAIR;  Surgeon: Adin Hector, MD;  Location: [facility];  Service: General;  Laterality: N/A;  . Colon surgery      colectomy  . Ablation  01/04/14    atrial flutter ablation (2 circuits) by Dr Lovena Le     Family History  Problem Relation Age of Onset  . Heart attack Neg Hx      History   Social History  . Marital Status: Single    Spouse Name: N/A    Number of Children: N/A  . Years of Education: N/A   Occupational History  . Not on file.   Social History Main Topics  . Smoking status: Former Smoker -- 1.00 packs/day    Types: Cigarettes    Quit date: 11/27/2012  . Smokeless tobacco: Never Used  . Alcohol Use: No     Comment: recovering alcoholic sober since [age]  . Drug Use: No  . Sexual Activity: Yes    Partners: Male    Birth Control/ Protection: None     Comment: occasionally sexually active    Other Topics Concern  . Not on file   Social History Narrative  . No narrative on file     Ht 5\' 8"  (1.727 m)  LMP 06/25/2006  Physical Exam:  Chronically ill appearing 58 yo woman, NAD HEENT: Unremarkable Neck:  No JVD, no thyromegally Back:  No CVA tenderness Lungs:  Clear with scattered rales HEART:  Regular rate rhythm, no murmurs, no rubs, no clicks Abd:  soft, positive bowel sounds, no organomegally, no rebound, no guarding Ext:  2 plus pulses, no edema, no cyanosis, no clubbing, marked arthritic changes from RA. Skin:  No rashes no nodules Neuro:  CN II through XII intact, motor grossly intact  EKG nsr  Assess/Plan:

## 2014-02-10 NOTE — Telephone Encounter (Signed)
They want orders for physical therapy 1 x wk for one week and then 2 x wk for 7 weeks Ok to leave [employer]

## 2014-02-10 NOTE — Assessment & Plan Note (Signed)
Her blood pressure is a little low. I have asked her to wean herself off of her Coreg.

## 2014-02-10 NOTE — Patient Instructions (Addendum)
Your physician has recommended you make the following change in your medication:  1) DECREASE Carvedilol to 1/2 tablet for 2 weeks, then stop medication.

## 2014-02-11 NOTE — Telephone Encounter (Signed)
Please take care of this.  

## 2014-02-11 NOTE — Telephone Encounter (Signed)
Left message to advise Mickel Baas ok to continue PT, per Dr. Sarajane Jews

## 2014-02-16 DIAGNOSIS — I5022 Chronic systolic (congestive) heart failure: Secondary | ICD-10-CM

## 2014-02-16 DIAGNOSIS — I4891 Unspecified atrial fibrillation: Secondary | ICD-10-CM

## 2014-02-16 DIAGNOSIS — M069 Rheumatoid arthritis, unspecified: Secondary | ICD-10-CM

## 2014-02-16 DIAGNOSIS — Q8740 Marfan's syndrome, unspecified: Secondary | ICD-10-CM

## 2014-02-16 DIAGNOSIS — I10 Essential (primary) hypertension: Secondary | ICD-10-CM

## 2014-02-19 ENCOUNTER — Other Ambulatory Visit: Payer: Self-pay | Admitting: Family Medicine

## 2014-03-18 ENCOUNTER — Telehealth: Payer: Self-pay | Admitting: Family Medicine

## 2014-03-18 NOTE — Telephone Encounter (Signed)
Would like to request a social worker eval to get pt into a handicapp apartment Verbal order ok and also ok to leave a message

## 2014-03-19 NOTE — Telephone Encounter (Signed)
Verbal order given to Lori  

## 2014-03-19 NOTE — Telephone Encounter (Signed)
Please set this up for her

## 2014-03-24 ENCOUNTER — Telehealth: Payer: Self-pay | Admitting: Family Medicine

## 2014-03-24 NOTE — Telephone Encounter (Signed)
Pt's cert period runs through end of Oct. [facility] would like to request 2 additional skilled nurse visits to prepare her for dc.  Verbal ok. May leave message

## 2014-03-24 NOTE — Telephone Encounter (Signed)
I spoke with [name] and gave verbal order from below.

## 2014-03-24 NOTE — Telephone Encounter (Signed)
Okay to do this. 

## 2014-03-26 ENCOUNTER — Telehealth: Payer: Self-pay | Admitting: Family Medicine

## 2014-03-26 NOTE — Telephone Encounter (Signed)
Anderson Malta from home care called to request a verbal order for a social worker to help facilitate pt getting into a skilled nursing facility. Per Dr. Sarajane Jews, okay to give the verbal order, which I did speak with Anderson Malta and gave order.

## 2014-03-29 ENCOUNTER — Telehealth: Payer: Self-pay | Admitting: Family Medicine

## 2014-03-29 NOTE — Telephone Encounter (Signed)
[facility] [employer] officer, [facility] would like two follow up visit with pt  To help with getting pt resources . Please call with verbal order

## 2014-03-29 NOTE — Telephone Encounter (Signed)
Per Dr. Sarajane Jews, okay to give verbal order. I left a voice message for Millie with verbal order for 2 more visits.

## 2014-04-01 ENCOUNTER — Telehealth: Payer: Self-pay | Admitting: Family Medicine

## 2014-04-01 NOTE — Telephone Encounter (Signed)
[facility] they are discharging pt for pt today 04/01/14

## 2014-04-19 ENCOUNTER — Encounter: Payer: Self-pay | Admitting: Certified Nurse Midwife

## 2014-04-19 ENCOUNTER — Ambulatory Visit: Payer: Medicare Other | Admitting: Certified Nurse Midwife

## 2014-04-19 ENCOUNTER — Telehealth: Payer: Self-pay | Admitting: Certified Nurse Midwife

## 2014-04-19 NOTE — Telephone Encounter (Signed)
Pt called during lunch to cancel her appt for 1:00 said that her daughter didn't not pick up her. Pt aware she will be charged for missing appt.

## 2014-04-23 ENCOUNTER — Telehealth: Payer: Self-pay | Admitting: Certified Nurse Midwife

## 2014-04-23 NOTE — Telephone Encounter (Signed)
Pt is requestign that the dnka fee be wavied

## 2014-04-26 ENCOUNTER — Encounter: Payer: Self-pay | Admitting: Internal Medicine

## 2014-04-29 ENCOUNTER — Other Ambulatory Visit: Payer: Self-pay | Admitting: Family Medicine

## 2014-04-29 ENCOUNTER — Telehealth: Payer: Self-pay | Admitting: Family Medicine

## 2014-04-29 NOTE — Telephone Encounter (Signed)
Pt left message over lunch to check on message regarding her dnka fee charge.

## 2014-04-29 NOTE — Telephone Encounter (Signed)
Pt is requesting re-fill on HYDROcodone-acetaminophen (NORCO) 10-325 MG per tablet

## 2014-05-01 ENCOUNTER — Emergency Department (HOSPITAL_COMMUNITY): Payer: Medicare Other

## 2014-05-01 ENCOUNTER — Encounter (HOSPITAL_COMMUNITY): Payer: Self-pay | Admitting: Emergency Medicine

## 2014-05-01 ENCOUNTER — Inpatient Hospital Stay (HOSPITAL_COMMUNITY)
Admission: EM | Admit: 2014-05-01 | Discharge: 2014-05-11 | DRG: 389 | Disposition: A | Discharge status: Home Health | Payer: Medicare Other | Attending: Internal Medicine | Admitting: Internal Medicine

## 2014-05-01 DIAGNOSIS — H540 Blindness, both eyes: Secondary | ICD-10-CM

## 2014-05-01 DIAGNOSIS — M069 Rheumatoid arthritis, unspecified: Secondary | ICD-10-CM | POA: Diagnosis present

## 2014-05-01 DIAGNOSIS — K5669 Other intestinal obstruction: Secondary | ICD-10-CM

## 2014-05-01 DIAGNOSIS — Z9049 Acquired absence of other specified parts of digestive tract: Secondary | ICD-10-CM | POA: Diagnosis present

## 2014-05-01 DIAGNOSIS — M4155 Other secondary scoliosis, thoracolumbar region: Secondary | ICD-10-CM | POA: Diagnosis present

## 2014-05-01 DIAGNOSIS — Q8742 Marfan's syndrome with ocular manifestations: Secondary | ICD-10-CM | POA: Diagnosis not present

## 2014-05-01 DIAGNOSIS — M810 Age-related osteoporosis without current pathological fracture: Secondary | ICD-10-CM | POA: Diagnosis present

## 2014-05-01 DIAGNOSIS — M1A9XX0 Chronic gout, unspecified, without tophus (tophi): Secondary | ICD-10-CM | POA: Diagnosis present

## 2014-05-01 DIAGNOSIS — E041 Nontoxic single thyroid nodule: Secondary | ICD-10-CM | POA: Diagnosis present

## 2014-05-01 DIAGNOSIS — K565 Intestinal adhesions [bands] with obstruction (postprocedural) (postinfection): Secondary | ICD-10-CM | POA: Diagnosis not present

## 2014-05-01 DIAGNOSIS — I4891 Unspecified atrial fibrillation: Secondary | ICD-10-CM | POA: Diagnosis present

## 2014-05-01 DIAGNOSIS — I10 Essential (primary) hypertension: Secondary | ICD-10-CM | POA: Diagnosis present

## 2014-05-01 DIAGNOSIS — I440 Atrioventricular block, first degree: Secondary | ICD-10-CM | POA: Diagnosis present

## 2014-05-01 DIAGNOSIS — Z96653 Presence of artificial knee joint, bilateral: Secondary | ICD-10-CM | POA: Diagnosis present

## 2014-05-01 DIAGNOSIS — R079 Chest pain, unspecified: Secondary | ICD-10-CM | POA: Diagnosis present

## 2014-05-01 DIAGNOSIS — N2889 Other specified disorders of kidney and ureter: Secondary | ICD-10-CM | POA: Diagnosis present

## 2014-05-01 DIAGNOSIS — K56600 Partial intestinal obstruction, unspecified as to cause: Secondary | ICD-10-CM

## 2014-05-01 DIAGNOSIS — M818 Other osteoporosis without current pathological fracture: Secondary | ICD-10-CM | POA: Diagnosis present

## 2014-05-01 DIAGNOSIS — E876 Hypokalemia: Secondary | ICD-10-CM | POA: Diagnosis not present

## 2014-05-01 DIAGNOSIS — K219 Gastro-esophageal reflux disease without esophagitis: Secondary | ICD-10-CM | POA: Diagnosis present

## 2014-05-01 DIAGNOSIS — R7611 Nonspecific reaction to tuberculin skin test without active tuberculosis: Secondary | ICD-10-CM | POA: Diagnosis present

## 2014-05-01 DIAGNOSIS — Z8673 Personal history of transient ischemic attack (TIA), and cerebral infarction without residual deficits: Secondary | ICD-10-CM | POA: Diagnosis not present

## 2014-05-01 DIAGNOSIS — R1110 Vomiting, unspecified: Secondary | ICD-10-CM

## 2014-05-01 DIAGNOSIS — Z933 Colostomy status: Secondary | ICD-10-CM | POA: Diagnosis not present

## 2014-05-01 DIAGNOSIS — H547 Unspecified visual loss: Secondary | ICD-10-CM | POA: Diagnosis present

## 2014-05-01 DIAGNOSIS — H548 Legal blindness, as defined in USA: Secondary | ICD-10-CM | POA: Diagnosis present

## 2014-05-01 DIAGNOSIS — Z79899 Other long term (current) drug therapy: Secondary | ICD-10-CM | POA: Diagnosis not present

## 2014-05-01 DIAGNOSIS — H9193 Unspecified hearing loss, bilateral: Secondary | ICD-10-CM | POA: Diagnosis present

## 2014-05-01 DIAGNOSIS — K56609 Unspecified intestinal obstruction, unspecified as to partial versus complete obstruction: Secondary | ICD-10-CM | POA: Diagnosis present

## 2014-05-01 DIAGNOSIS — F1021 Alcohol dependence, in remission: Secondary | ICD-10-CM | POA: Diagnosis present

## 2014-05-01 DIAGNOSIS — Q8743 Marfan's syndrome with skeletal manifestation: Secondary | ICD-10-CM | POA: Diagnosis not present

## 2014-05-01 DIAGNOSIS — K432 Incisional hernia without obstruction or gangrene: Secondary | ICD-10-CM | POA: Diagnosis present

## 2014-05-01 DIAGNOSIS — Q8740 Marfan's syndrome, unspecified: Secondary | ICD-10-CM

## 2014-05-01 DIAGNOSIS — M368 Systemic disorders of connective tissue in other diseases classified elsewhere: Secondary | ICD-10-CM | POA: Diagnosis present

## 2014-05-01 DIAGNOSIS — Z87891 Personal history of nicotine dependence: Secondary | ICD-10-CM

## 2014-05-01 DIAGNOSIS — K5720 Diverticulitis of large intestine with perforation and abscess without bleeding: Secondary | ICD-10-CM | POA: Diagnosis present

## 2014-05-01 DIAGNOSIS — R000 Tachycardia, unspecified: Secondary | ICD-10-CM | POA: Diagnosis present

## 2014-05-01 LAB — CBC WITH DIFFERENTIAL/PLATELET
Basophils Absolute: 0 10*3/uL (ref 0.0–0.1)
Basophils Relative: 0 % (ref 0–1)
Eosinophils Absolute: 0.2 10*3/uL (ref 0.0–0.7)
Eosinophils Relative: 2 % (ref 0–5)
HCT: 35.9 % — ABNORMAL LOW (ref 36.0–46.0)
Hemoglobin: 11.6 g/dL — ABNORMAL LOW (ref 12.0–15.0)
Lymphocytes Relative: 17 % (ref 12–46)
Lymphs Abs: 1.4 10*3/uL (ref 0.7–4.0)
MCH: 29.7 pg (ref 26.0–34.0)
MCHC: 32.3 g/dL (ref 30.0–36.0)
MCV: 92.1 fL (ref 78.0–100.0)
Monocytes Absolute: 0.5 10*3/uL (ref 0.1–1.0)
Monocytes Relative: 6 % (ref 3–12)
Neutro Abs: 6.2 10*3/uL (ref 1.7–7.7)
Neutrophils Relative %: 75 % (ref 43–77)
Platelets: 296 10*3/uL (ref 150–400)
RBC: 3.9 MIL/uL (ref 3.87–5.11)
RDW: 14 % (ref 11.5–15.5)
WBC: 8.3 10*3/uL (ref 4.0–10.5)

## 2014-05-01 LAB — BASIC METABOLIC PANEL
Anion gap: 16 — ABNORMAL HIGH (ref 5–15)
BUN: 15 mg/dL (ref 6–23)
CO2: 24 mEq/L (ref 19–32)
Calcium: 9.8 mg/dL (ref 8.4–10.5)
Chloride: 102 mEq/L (ref 96–112)
Creatinine, Ser: 0.77 mg/dL (ref 0.50–1.10)
GFR calc Af Amer: >90 mL/min (ref >90)
GFR calc non Af Amer: >90 mL/min (ref >90)
Glucose, Bld: 98 mg/dL (ref 70–99)
Potassium: 3.8 mEq/L (ref 3.7–5.3)
Sodium: 142 mEq/L (ref 137–147)

## 2014-05-01 LAB — I-STAT TROPONIN, ED: Troponin i, poc: 0 ng/mL (ref 0.00–0.08)

## 2014-05-01 LAB — CBC
HCT: 35.8 % — ABNORMAL LOW (ref 36.0–46.0)
Hemoglobin: 11.5 g/dL — ABNORMAL LOW (ref 12.0–15.0)
MCH: 30.2 pg (ref 26.0–34.0)
MCHC: 32.1 g/dL (ref 30.0–36.0)
MCV: 94 fL (ref 78.0–100.0)
Platelets: 273 10*3/uL (ref 150–400)
RBC: 3.81 MIL/uL — ABNORMAL LOW (ref 3.87–5.11)
RDW: 14.1 % (ref 11.5–15.5)
WBC: 6.9 10*3/uL (ref 4.0–10.5)

## 2014-05-01 LAB — TROPONIN I
Troponin I: <0.3 ng/mL (ref <0.30)
Troponin I: <0.3 ng/mL (ref <0.30)
Troponin I: <0.3 ng/mL (ref <0.30)

## 2014-05-01 LAB — CREATININE, SERUM
Creatinine, Ser: 0.79 mg/dL (ref 0.50–1.10)
GFR calc Af Amer: >90 mL/min (ref >90)
GFR calc non Af Amer: >90 mL/min (ref >90)

## 2014-05-01 MED ORDER — ALBUTEROL SULFATE (2.5 MG/3ML) 0.083% IN NEBU: 2.5000 mg | INHALATION_SOLUTION | RESPIRATORY_TRACT | Status: DC | PRN

## 2014-05-01 MED ORDER — ONDANSETRON HCL 4 MG PO TABS: 4.0000 mg | ORAL_TABLET | Freq: Four times a day (QID) | ORAL | Status: DC | PRN

## 2014-05-01 MED ORDER — MORPHINE SULFATE 2 MG/ML IJ SOLN
2.0000 mg | INTRAMUSCULAR | Status: DC | PRN
  Administered 2014-05-01 – 2014-05-11 (×22): 2 mg via INTRAVENOUS
  Filled 2014-05-01 (×22): qty 1

## 2014-05-01 MED ORDER — METOPROLOL TARTRATE 1 MG/ML IV SOLN
5.0000 mg | Freq: Four times a day (QID) | INTRAVENOUS | Status: DC | PRN
  Administered 2014-05-01 – 2014-05-02 (×2): 5 mg via INTRAVENOUS
  Filled 2014-05-01 (×3): qty 5

## 2014-05-01 MED ORDER — SODIUM CHLORIDE 0.9 % IV SOLN
INTRAVENOUS | Status: DC
  Administered 2014-05-01 – 2014-05-05 (×5): via INTRAVENOUS

## 2014-05-01 MED ORDER — IOHEXOL 350 MG/ML SOLN
100.0000 mL | Freq: Once | INTRAVENOUS | Status: AC | PRN
  Administered 2014-05-01: 100 mL via INTRAVENOUS

## 2014-05-01 MED ORDER — ENOXAPARIN SODIUM 40 MG/0.4ML ~~LOC~~ SOLN
40.0000 mg | SUBCUTANEOUS | Status: DC
  Administered 2014-05-01 – 2014-05-10 (×10): 40 mg via SUBCUTANEOUS
  Filled 2014-05-01 (×10): qty 0.4

## 2014-05-01 MED ORDER — SODIUM CHLORIDE 0.9 % IV BOLUS (SEPSIS)
500.0000 mL | Freq: Once | INTRAVENOUS | Status: AC
  Administered 2014-05-01: 500 mL via INTRAVENOUS

## 2014-05-01 MED ORDER — SODIUM CHLORIDE 0.9 % IV SOLN: INTRAVENOUS | Status: DC

## 2014-05-01 MED ORDER — ONDANSETRON HCL 4 MG/2ML IJ SOLN
4.0000 mg | Freq: Once | INTRAMUSCULAR | Status: AC
  Administered 2014-05-01: 4 mg via INTRAVENOUS
  Filled 2014-05-01: qty 2

## 2014-05-01 MED ORDER — ACETAMINOPHEN 325 MG PO TABS: 650.0000 mg | ORAL_TABLET | Freq: Four times a day (QID) | ORAL | Status: DC | PRN

## 2014-05-01 MED ORDER — ALBUTEROL SULFATE HFA 108 (90 BASE) MCG/ACT IN AERS: 1.0000 | INHALATION_SPRAY | RESPIRATORY_TRACT | Status: DC | PRN

## 2014-05-01 MED ORDER — ACETAMINOPHEN 650 MG RE SUPP: 650.0000 mg | Freq: Four times a day (QID) | RECTAL | Status: DC | PRN

## 2014-05-01 MED ORDER — ONDANSETRON HCL 4 MG/2ML IJ SOLN
4.0000 mg | Freq: Four times a day (QID) | INTRAMUSCULAR | Status: DC | PRN
  Administered 2014-05-01 – 2014-05-02 (×5): 4 mg via INTRAVENOUS
  Filled 2014-05-01 (×6): qty 2

## 2014-05-01 NOTE — ED Provider Notes (Signed)
Medical screening examination/treatment/procedure(s) were conducted as a shared visit with non-physician practitioner(s) and myself.  I personally evaluated the patient during the encounter.   EKG Interpretation   Date/Time:  Saturday May 01 2014 10:55:36 EST Ventricular Rate:  107 PR Interval:  212 QRS Duration: 77 QT Interval:  318 QTC Calculation: 424 R Axis:   -13 Text Interpretation:  Fast sinus arrhythmia Prolonged PR interval Probable  left atrial enlargement Abnormal R-wave progression, early transition  Nonspecific T abnrm, anterolateral leads Confirmed by WARD,  DO, KRISTEN  ([id number]) on 05/01/2014 11:12:32 AM      Pt is a 58 y.o. F with a h/o HTN, CVA, atrial flutter, Marfan syndrome who presents to ED with chest pain and abdominal pain.  Patient is intermittently tachycardic but otherwise hemodynamically stable. Equal pulses in all of her extremities. Abdomen is extremity tender to palpation with multiple areas of surgical scars from prior surgeries and she has a colostomy bag with a small amount of normal colored stool.  CT scan shows no dissection she does have a high-grade small bowel obstruction from possible internal hernia versus adhesions. We'll discuss with surgery. Will admit.  [place], DO 05/01/14 1420

## 2014-05-01 NOTE — Progress Notes (Signed)
Dr Rogue Bussing with [facility] paged regarding pt's vomiting episodes. Pt vomited 300 cc brown emesis. Pt remains NPO.

## 2014-05-01 NOTE — ED Notes (Addendum)
Pt reports feeling nauseated; medicine given. Pt to go to CT without distress at this time.

## 2014-05-01 NOTE — ED Notes (Signed)
Called ct to find out when pt can go, they were waiting for labs.  Pt to go to ct soon

## 2014-05-01 NOTE — ED Provider Notes (Signed)
CSN: [id number]     Arrival date & time 05/01/14  1047 History   First MD Initiated Contact with Patient 05/01/14 1048     Chief Complaint  Patient presents with  . Chest Pain     (Consider location/radiation/quality/duration/timing/severity/associated sxs/prior Treatment) HPI   PCP: DR. Annie Main FRY  Pt here for CP. Was in the hospital from  12-31-13 to 01-07-14 for atrial flutter/fibrillation. She was admitted for SOB and tachycardia, and her ventricular rate was stabilized. She was started on Amiodarone and eventually was ablated. She woke this morning with her heart rate between 90-120 with irregular beat. She was given 324 aspirin and 1 nitro per EMS. The patient is legally blind and hard of hearing. She has Marfan's syndrome with scoliosis, she is a recovered alcoholic, she has colocstomy bag possibly from previous small bowel obstruction, rheumatoid arthritis, fibroids, hernia, hypertension.  She is a poor historian but informs me that she was having pain under her left breast this morning. She is unable to tell me specifically when it started and resolved but only that is has since resolved.   Past Medical History  Diagnosis Date  . Legally blind     since pt was a teenager  . Hearing aid worn     pt wears bilateral hearing aids  . Marfan's syndrome affecting skin     with scolosis  . Osteoporosis   . Hypertension   . Substance abuse     recovering alcoholic W10 years   . Hernia 4/08  . 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 Chronic gout 2008  . Stroke 2/08  . Fibroid   . Status post bunionectomy 1/10    bilateral   . Diverticulitis of large intestine with perforation   . Small bowel obstruction due to adhesions   . Rheumatoid arthritis     sees Dr. Gavin Pound   . SBO (small bowel obstruction) 01/05/2013  . Atrial flutter    Past Surgical History  Procedure Laterality Date  . Bunionectomy Bilateral 1/10  . Total knee arthroplasty Bilateral 6/08  .  Hernia repair    . Laparotomy N/A 11/27/2012    Procedure: EXPLORATORY LAPAROTOMY SIGMOID COLECTOMY, COLOSTOMY;  Surgeon: Madilyn Hook, DO;  Location: [facility];  Service: General;  Laterality: N/A;  . Colostomy  11/27/2012  . Laparoscopic abdominal exploration N/A 01/06/2013    Procedure: LAPAROSCOPIC ABDOMINAL EXPLORATION;  Surgeon: Adin Hector, MD;  Location: [facility];  Service: General;  Laterality: N/A;  . Laparoscopic lysis of adhesions N/A 01/06/2013    Procedure: LAPAROSCOPIC LYSIS OF ADHESIONS/ INTEROTOMY REPAIR;  Surgeon: Adin Hector, MD;  Location: [facility];  Service: General;  Laterality: N/A;  . Colon surgery      colectomy  . Ablation  01/04/14    atrial flutter ablation (2 circuits) by Dr Lovena Le   Family History  Problem Relation Age of Onset  . Heart attack Neg Hx    History  Substance Use Topics  . Smoking status: Former Smoker -- 1.00 packs/day    Types: Cigarettes    Quit date: 11/27/2012  . Smokeless tobacco: Never Used  . Alcohol Use: No     Comment: recovering alcoholic sober since 2725   OB History    Gravida Para Term Preterm AB TAB SAB Ectopic Multiple Living   1 1 1       1      Review of Systems  10 Systems reviewed and are negative  for acute change except as noted in the HPI.    Allergies  Review of patient's allergies indicates no known allergies.  Home Medications   Prior to Admission medications   Medication Sig Start Date End Date Taking? Authorizing Provider  albuterol (PROVENTIL HFA;VENTOLIN HFA) 108 (90 BASE) MCG/ACT inhaler Inhale 1 puff into the lungs every 4 (four) hours as needed for wheezing or shortness of breath. 12/29/13  Yes [name], MD  allopurinol (ZYLOPRIM) 100 MG tablet Take 100 mg by mouth daily.   Yes Historical Provider, MD  amLODipine (NORVASC) 10 MG tablet take 1 tablet by mouth once daily 02/19/14  Yes [name], MD  Calcium Carbonate-Vitamin D (CALCIUM + D PO) Take 1,200 mg by mouth daily.   Yes Historical Provider,  MD  cetirizine (ZYRTEC) 10 MG tablet Take 10 mg by mouth daily.   Yes Historical Provider, MD  Cyanocobalamin (VITAMIN B 12 PO) Take 1 tablet by mouth daily.   Yes Historical Provider, MD  cyclobenzaprine (FLEXERIL) 10 MG tablet Take 1 tablet (10 mg total) by mouth 3 (three) times daily as needed for muscle spasms. 09/21/13  Yes [name], MD  EPINEPHrine (EPIPEN 2-PAK) 0.3 mg/0.3 mL IJ SOAJ injection Inject 0.3 mLs (0.3 mg total) into the muscle once. 02/01/14  Yes [name], MD  folic acid (FOLVITE) 1 MG tablet Take 1 mg by mouth daily.   Yes Historical Provider, MD  HYDROcodone-acetaminophen (NORCO) 10-325 MG per tablet Take 1 tablet by mouth every 6 (six) hours as needed for moderate pain. 02/01/14  Yes [name], MD  isoniazid (NYDRAZID) 300 MG tablet Take 300 mg by mouth daily.   Yes Historical Provider, MD  ketoconazole (NIZORAL) 2 % cream Apply 1 application topically 2 (two) times daily. 08/10/13  Yes [name], MD  megestrol (MEGACE ES) 625 MG/5ML suspension Take 5 mL (625 mg total) by mouth 2 (two) times daily before a meal. 02/09/13  Yes [name], MD  methotrexate 25 MG/ML injection Inject 20 mg into the skin once a week. Take on Mondays   Yes Historical Provider, MD  Multiple Vitamins-Minerals (MULTIVITAMIN PO) Take 1 tablet by mouth daily.    Yes Historical Provider, MD  NEXIUM 40 MG capsule take 1 capsule by mouth twice a day 11/20/13  Yes [name], MD  pyridOXINE (VITAMIN B-6) 50 MG tablet Take 50 mg by mouth daily.   Yes Historical Provider, MD  risedronate (ACTONEL) 150 MG tablet Take 150 mg by mouth every 30 (thirty) days. with water on empty stomach, nothing by mouth or lie down for next 30 minutes.   Yes Historical Provider, MD  traMADol (ULTRAM) 50 MG tablet Take 50 mg by mouth every 6 (six) hours as needed for moderate pain.   Yes Historical Provider, MD  vitamin C (ASCORBIC ACID) 500 MG tablet Take 500 mg by mouth daily.   Yes Historical Provider, MD    metoprolol succinate (TOPROL-XL) 25 MG 24 hr tablet  12/30/13   Historical Provider, MD   BP 138/96 mmHg  Pulse 102  Temp(Src) 98.3 F (36.8 C) (Oral)  Resp 17  Ht 5\' 8"  (1.727 m)  Wt 140 lb (63.504 kg)  BMI 21.29 kg/m2  SpO2 100%  LMP 06/25/2006 Physical Exam  Constitutional: She appears well-developed and well-nourished. No distress.  HENT:  Head: Normocephalic and atraumatic.  Eyes: Pupils are equal, round, and reactive to light.  Neck: Normal range of motion. Neck supple.  Cardiovascular: Regular rhythm.  Tachycardia present.   Pulmonary/Chest: Effort normal.  Abdominal: Soft. Bowel sounds are normal. She exhibits no distension and no fluid wave. There is tenderness. There is no rigidity, no rebound and no guarding.    Neurological: She is alert.  Skin: Skin is warm and dry.  Nursing note and vitals reviewed.     ED Course  Procedures (including critical care time) Labs Review Labs Reviewed  CBC WITH DIFFERENTIAL - Abnormal; Notable for the following:    Hemoglobin 11.6 (*)    HCT 35.9 (*)    All other components within normal limits  BASIC METABOLIC PANEL - Abnormal; Notable for the following:    Anion gap 16 (*)    All other components within normal limits  TROPONIN I  I-STAT TROPOININ, ED    Imaging Review Ct Angio Chest Aorta W/cm &/or Wo/cm  05/01/2014   CLINICAL DATA:  58 year old female with left upper abdominal pain. Past history of colon surgery, colostomy, hernia repair and diverticulitis.  EXAM: CT ANGIOGRAPHY CHEST, ABDOMEN AND PELVIS  TECHNIQUE: Multidetector CT imaging through the chest, abdomen and pelvis was performed using the standard protocol during bolus administration of intravenous contrast. Multiplanar reconstructed images and MIPs were obtained and reviewed to evaluate the vascular anatomy.  CONTRAST:  171mL OMNIPAQUE IOHEXOL 350 MG/ML SOLN  COMPARISON:  Prior CTA abdomen and pelvis 02/07/2014 ; prior CT scan of the chest 05/23/2013   FINDINGS: CTA CHEST FINDINGS  VASCULAR  Heart/Vascular: No evidence of intramural hematoma on the non contrasted images. Normal caliber aorta without evidence of aneurysmal dilatation or dissection. The aorta is tortuous. Scattered mild atherosclerotic vascular calcifications. The main pulmonary artery is borderline enlarged. Cardiomegaly with left ventricular dilatation. No pericardial effusion. Atherosclerotic vascular calcifications present within the left anterior descending coronary artery.  Review of the MIP images confirms the above findings.  NON VASCULAR  Mediastinum: 1.9 cm nonspecific cystic nodule within the right thyroid gland demonstrates slight interval enlargement compared to 1.6 cm in November of 2014. Additional smaller thyroid nodules bilaterally. No suspicious adenopathy. Oblong soft tissue density in the anterior mediastinum demonstrates no significant interval change compared to the prior imaging. Today at the level of the pulmonary trunk, the abnormality measures 2.7 x 1.2 cm compared to 2.8 x 1.7 cm previously. More superiorly at the level of the aortic arch, the abnormality measures 1.4 x 2.6 cm which is very similar compared to prior.  Lungs/Pleura: Mild dependent atelectasis in the lower lobes. No suspicious pulmonary nodule or mass. Decreased AP diameter of the chest due in part to marked levoconvex scoliosis of the thoracolumbar spine.  Bones/Soft Tissues: Marked levoconvex scoliosis of the thoracic spine. Dystrophic calcifications present about both shoulder joints. 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  CTA ABDOMEN AND PELVIS FINDINGS  VASCULAR  Aorta: Heterogeneous atherosclerotic calcification throughout the normal caliber aorta. No aneurysm, dissection or significant penetrating aortic ulcer.  Celiac: Widely patent. No splenic or hepatic arterial aneurysm. Conventional hepatic arterial anatomy.  SMA: Fibro fatty and calcified atherosclerotic plaque  results in mild stenosis of the proximal SMA. Distally the vessels are unremarkable.  Renals: Single dominant right renal artery. Small accessory left renal artery to the lower pole. Mild atherosclerotic vascular calcifications of the arterial origins without significant stenosis.  IMA: Patent.  Inflow: Scattered atherosclerotic vascular calcification without significant stenosis, aneurysmal dilatation or dissection.  Proximal Outflow: Widely patent and relatively spared from disease  Veins: No focal venous abnormality.  Review of the  MIP images confirms the above findings.  NON-VASCULAR  Abdomen: Unremarkable CTA appearance of the stomach, duodenum, spleen, adrenal glands and pancreas. 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 Normal hepatic morphology without discrete solid lesion. Stable hepatic cysts. Stable punctate hyper enhancing focus at the hepatic dome. Gallbladder is unremarkable. No intra or extrahepatic biliary ductal dilatation.  No hydronephrosis, nephrolithiasis or evidence of pyelonephritis. Intermediate attenuation and possibly enhancing lesion exophytic from the posterior interpolar left kidney appears slightly enlarged at 15 x 13 mm compared to 14 x 12 mm previously.  High-grade small bowel obstruction. Focal transition point in the the low mid abdomen just left of midline at the L4 level. The transition point occurs as the small bowel passes over the segment of colon heading toward the left lower quadrant colostomy. No evidence of ischemia of the affected loops of bowel. No significant surrounding inflammatory change, free fluid or free air.  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 The appendix is normal.  Pelvis: Unremarkable bladder, uterus and adnexa. Mild pelvic floor laxity.  Bones/Soft Tissues: Severe levoconvex scoliosis. 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 No  definite acute osseous abnormality.  IMPRESSION: CTA CHEST  1. No acute vascular abnormality or aneurysmal dilatation. 2. Atherosclerotic vascular calcifications including coronary artery disease. 3. Cardiomegaly with left ventricular dilatation. 4. Enlarging low-attenuation nodule in the right thyroid gland compared to prior imaging. Recommend further evaluation with dedicated thyroid ultrasound as a biopsy may be warranted. 5. 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 6. Degenerative arthritis and dystrophic calcifications about both glenohumeral joints. CTA ABD/PELVIS  1. No acute vascular abnormality or aneurysmal dilatation. 2. High-grade small bowel obstruction with focal transition point just left of midline in the mid to lower abdomen. The transition point occurs as the small bowel passes over the segment of colon passing anteriorly to the left lower quadrant abdominal wall colostomy. Differential considerations include postsurgical adhesions and internal hernia. No evidence of ischemia or other complicating feature involving the loops of bowel involved in the obstruction. No free fluid or free air. 3. Similar to perhaps slightly enlarged intermediate attenuation and possibly enhancing lesion exophytic from the posterior interpolar left kidney. Renal neoplasm can not be excluded and is suspected. Recommend further evaluation with abdominal MRI with contrast. 4. Severe levoconvex scoliosis. 5. Additional ancillary findings as above without significant interval change compared to prior imaging. These results were called by telephone at the time of interpretation on 05/01/2014 at 2:15 pm to Dr. Wyona Almas, who verbally acknowledged these results.  Signed,  Criselda Peaches, MD  [facility]  [facility]   Electronically Signed   By: Jacqulynn Cadet M.D.   On: 05/01/2014 14:18   Ct Cta Abd/pel W/cm &/or W/o  Cm  05/01/2014   CLINICAL DATA:  58 year old female with left upper abdominal pain. Past history of colon surgery, colostomy, hernia repair and diverticulitis.  EXAM: CT ANGIOGRAPHY CHEST, ABDOMEN AND PELVIS  TECHNIQUE: Multidetector CT imaging through the chest, abdomen and pelvis was performed using the standard protocol during bolus administration of intravenous contrast. Multiplanar reconstructed images and MIPs were obtained and reviewed to evaluate the vascular anatomy.  CONTRAST:  152mL OMNIPAQUE IOHEXOL 350 MG/ML SOLN  COMPARISON:  Prior CTA abdomen and pelvis 02/07/2014 ; prior CT scan of the chest 05/23/2013  FINDINGS: CTA CHEST FINDINGS  VASCULAR  Heart/Vascular: No evidence  of intramural hematoma on the non contrasted images. Normal caliber aorta without evidence of aneurysmal dilatation or dissection. The aorta is tortuous. Scattered mild atherosclerotic vascular calcifications. The main pulmonary artery is borderline enlarged. Cardiomegaly with left ventricular dilatation. No pericardial effusion. Atherosclerotic vascular calcifications present within the left anterior descending coronary artery.  Review of the MIP images confirms the above findings.  NON VASCULAR  Mediastinum: 1.9 cm nonspecific cystic nodule within the right thyroid gland demonstrates slight interval enlargement compared to 1.6 cm in November of 2014. Additional smaller thyroid nodules bilaterally. No suspicious adenopathy. Oblong soft tissue density in the anterior mediastinum demonstrates no significant interval change compared to the prior imaging. Today at the level of the pulmonary trunk, the abnormality measures 2.7 x 1.2 cm compared to 2.8 x 1.7 cm previously. More superiorly at the level of the aortic arch, the abnormality measures 1.4 x 2.6 cm which is very similar compared to prior.  Lungs/Pleura: Mild dependent atelectasis in the lower lobes. No suspicious pulmonary nodule or mass. Decreased AP diameter of the chest due  in part to marked levoconvex scoliosis of the thoracolumbar spine.  Bones/Soft Tissues: Marked levoconvex scoliosis of the thoracic spine. Dystrophic calcifications present about both shoulder joints. 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  CTA ABDOMEN AND PELVIS FINDINGS  VASCULAR  Aorta: Heterogeneous atherosclerotic calcification throughout the normal caliber aorta. No aneurysm, dissection or significant penetrating aortic ulcer.  Celiac: Widely patent. No splenic or hepatic arterial aneurysm. Conventional hepatic arterial anatomy.  SMA: Fibro fatty and calcified atherosclerotic plaque results in mild stenosis of the proximal SMA. Distally the vessels are unremarkable.  Renals: Single dominant right renal artery. Small accessory left renal artery to the lower pole. Mild atherosclerotic vascular calcifications of the arterial origins without significant stenosis.  IMA: Patent.  Inflow: Scattered atherosclerotic vascular calcification without significant stenosis, aneurysmal dilatation or dissection.  Proximal Outflow: Widely patent and relatively spared from disease  Veins: No focal venous abnormality.  Review of the MIP images confirms the above findings.  NON-VASCULAR  Abdomen: Unremarkable CTA appearance of the stomach, duodenum, spleen, adrenal glands and pancreas. 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 Normal hepatic morphology without discrete solid lesion. Stable hepatic cysts. Stable punctate hyper enhancing focus at the hepatic dome. Gallbladder is unremarkable. No intra or extrahepatic biliary ductal dilatation.  No hydronephrosis, nephrolithiasis or evidence of pyelonephritis. Intermediate attenuation and possibly enhancing lesion exophytic from the posterior interpolar left kidney appears slightly enlarged at 15 x 13 mm compared to 14 x 12 mm previously.  High-grade small bowel obstruction. Focal  transition point in the the low mid abdomen just left of midline at the L4 level. The transition point occurs as the small bowel passes over the segment of colon heading toward the left lower quadrant colostomy. No evidence of ischemia of the affected loops of bowel. No significant surrounding inflammatory change, free fluid or free air.  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 The appendix is normal.  Pelvis: Unremarkable bladder, uterus and adnexa. Mild pelvic floor laxity.  Bones/Soft Tissues: Severe levoconvex scoliosis. 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 No definite acute osseous abnormality.  IMPRESSION: CTA CHEST  1. No acute vascular abnormality or aneurysmal dilatation. 2. Atherosclerotic vascular calcifications including coronary artery disease. 3. Cardiomegaly with left ventricular dilatation. 4. Enlarging low-attenuation nodule in the right thyroid gland compared to prior imaging. Recommend further evaluation with  dedicated thyroid ultrasound as a biopsy may be warranted. 5. 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 6. Degenerative arthritis and dystrophic calcifications about both glenohumeral joints. CTA ABD/PELVIS  1. No acute vascular abnormality or aneurysmal dilatation. 2. High-grade small bowel obstruction with focal transition point just left of midline in the mid to lower abdomen. The transition point occurs as the small bowel passes over the segment of colon passing anteriorly to the left lower quadrant abdominal wall colostomy. Differential considerations include postsurgical adhesions and internal hernia. No evidence of ischemia or other complicating feature involving the loops of bowel involved in the obstruction. No free fluid or free air. 3. Similar to perhaps slightly enlarged intermediate attenuation and possibly enhancing lesion exophytic from the posterior interpolar left kidney. Renal  neoplasm can not be excluded and is suspected. Recommend further evaluation with abdominal MRI with contrast. 4. Severe levoconvex scoliosis. 5. Additional ancillary findings as above without significant interval change compared to prior imaging. These results were called by telephone at the time of interpretation on 05/01/2014 at 2:15 pm to Dr. Wyona Almas, who verbally acknowledged these results.  Signed,  Criselda Peaches, MD  [facility]  [facility]   Electronically Signed   By: Jacqulynn Cadet M.D.   On: 05/01/2014 14:18     EKG Interpretation   Date/Time:  Saturday May 01 2014 10:55:36 EST Ventricular Rate:  107 PR Interval:  212 QRS Duration: 77 QT Interval:  318 QTC Calculation: 424 R Axis:   -13 Text Interpretation:  Fast sinus arrhythmia Prolonged PR interval Probable  left atrial enlargement Abnormal R-wave progression, early transition  Nonspecific T abnrm, anterolateral leads Confirmed by WARD,  DO, KRISTEN  ([id number]) on 05/01/2014 11:12:32 AM      MDM   Final diagnoses:  Chest pain  Thyroid nodule  Small bowel obstruction  Renal mass   Negative Troponin- EKG is abnormal- tachycardic. No dissection. CT shows a new thyroid nodule an renal mass- these will need to be further evaluated. Her abdominal pain appears to be coming from a high grade small bowel obstruction.  2: 36 pm I spoke with Dr. Georganna Skeans who has agreed to consult on this patient. Will call [facility] for admission. Dr. Darrick Meigs, [place]. Inpatient, [facility], [facility]-admit  I re-evaluated patient. She continues to be tachy, nurse asked to please start fluids that were ordered. Med-Surg bed ordered.  Filed Vitals:   05/01/14 1230  BP: 138/96  Pulse: 102  Temp:   Resp: 17     Linus Mako, PA-C 05/01/14 1500

## 2014-05-01 NOTE — ED Notes (Signed)
Per EMS: cp/ lower left, aching radiating to upper abdominal area, woke up with it this am.  irreg hr 90-120s, sinus tach with frequent pacs and pvcs, has been cardioverted in past by ems, given 324 asa by ems and 1 ntg.  Some nausea but no diarrhea.  Currently denies pain.

## 2014-05-01 NOTE — ED Notes (Signed)
Attempted report x1. 

## 2014-05-01 NOTE — Consult Note (Signed)
Reason for Consult:SBO  Referring Physician:Tiffany Carlota Raspberry, PAC  PORCHE STEINBERGER is an 58 y.o. female.  HPI: [name] has multiple medical problems including Marfan's and RA and is well known to our service. She is status post sigmoid colectomy with colostomy for diverticulitis by Dr. Lilyan Punt in 2014. She developed small bowel obstruction approximately 5 weeks after that surgery requiring lysis of adhesions by Dr. Johney Maine. Since that time she has had one other admission for small bowel obstruction managed conservatively. She comes to the emergency room today complaining of chest pain and abdominal pain. She claims her abdominal pain is crampy. It is associated with nausea. At this time it has gone away but she still feels nauseated. She has been having bowel movements into her ostomy daily.She also has a known incisional hernia.  Past Medical History  Diagnosis Date  . Legally blind     since pt was a teenager  . Hearing aid worn     pt wears bilateral hearing aids  . Marfan's syndrome affecting skin     with scolosis  . Osteoporosis   . Hypertension   . Substance abuse     recovering alcoholic K56 years   . Hernia 4/08  . 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 Chronic gout 2008  . Stroke 2/08  . Fibroid   . Status post bunionectomy 1/10    bilateral   . Diverticulitis of large intestine with perforation   . Small bowel obstruction due to adhesions   . Rheumatoid arthritis     sees Dr. Gavin Pound   . SBO (small bowel obstruction) 01/05/2013  . Atrial flutter     Past Surgical History  Procedure Laterality Date  . Bunionectomy Bilateral 1/10  . Total knee arthroplasty Bilateral 6/08  . Hernia repair    . Laparotomy N/A 11/27/2012    Procedure: EXPLORATORY LAPAROTOMY SIGMOID COLECTOMY, COLOSTOMY;  Surgeon: Madilyn Hook, DO;  Location: [facility];  Service: General;  Laterality: N/A;  . Colostomy  11/27/2012  . Laparoscopic abdominal exploration N/A 01/06/2013    Procedure:  LAPAROSCOPIC ABDOMINAL EXPLORATION;  Surgeon: Adin Hector, MD;  Location: [facility];  Service: General;  Laterality: N/A;  . Laparoscopic lysis of adhesions N/A 01/06/2013    Procedure: LAPAROSCOPIC LYSIS OF ADHESIONS/ INTEROTOMY REPAIR;  Surgeon: Adin Hector, MD;  Location: [facility];  Service: General;  Laterality: N/A;  . Colon surgery      colectomy  . Ablation  01/04/14    atrial flutter ablation (2 circuits) by Dr Lovena Le    Family History  Problem Relation Age of Onset  . Heart attack Neg Hx     Social History:  reports that she quit smoking about 17 months ago. Her smoking use included Cigarettes. She smoked 1.00 pack per day. She has never used smokeless tobacco. She reports that she does not drink alcohol or use illicit drugs.  Allergies: No Known Allergies  Medications: Prior to Admission:  (Not in a hospital admission)  Results for orders placed or performed during the hospital encounter of 05/01/14 (from the past 48 hour(s))  I-stat troponin, ED     Status: None   Collection Time: 05/01/14 11:28 AM  Result Value Ref Range   Troponin i, poc 0.00 0.00 - 0.08 ng/mL   Comment 3            Comment: Due to the release kinetics of cTnI, a negative result within the first hours of the  onset of symptoms does not rule out myocardial infarction with certainty. If myocardial infarction is still suspected, repeat the test at appropriate intervals.   Troponin I     Status: None   Collection Time: 05/01/14 11:38 AM  Result Value Ref Range   Troponin I <0.30 <0.30 ng/mL    Comment:        Due to the release kinetics of cTnI, a negative result within the first hours of the onset of symptoms does not rule out myocardial infarction with certainty. If myocardial infarction is still suspected, repeat the test at appropriate intervals.   CBC with Differential     Status: Abnormal   Collection Time: 05/01/14 11:38 AM  Result Value Ref Range   WBC 8.3 4.0 - 10.5 K/uL   RBC 3.90  3.87 - 5.11 MIL/uL   Hemoglobin 11.6 (L) 12.0 - 15.0 g/dL   HCT 35.9 (L) 36.0 - 46.0 %   MCV 92.1 78.0 - 100.0 fL   MCH 29.7 26.0 - 34.0 pg   MCHC 32.3 30.0 - 36.0 g/dL   RDW 14.0 11.5 - 15.5 %   Platelets 296 150 - 400 K/uL   Neutrophils Relative % 75 43 - 77 %   Neutro Abs 6.2 1.7 - 7.7 K/uL   Lymphocytes Relative 17 12 - 46 %   Lymphs Abs 1.4 0.7 - 4.0 K/uL   Monocytes Relative 6 3 - 12 %   Monocytes Absolute 0.5 0.1 - 1.0 K/uL   Eosinophils Relative 2 0 - 5 %   Eosinophils Absolute 0.2 0.0 - 0.7 K/uL   Basophils Relative 0 0 - 1 %   Basophils Absolute 0.0 0.0 - 0.1 K/uL  Basic metabolic panel     Status: Abnormal   Collection Time: 05/01/14 11:38 AM  Result Value Ref Range   Sodium 142 137 - 147 mEq/L   Potassium 3.8 3.7 - 5.3 mEq/L   Chloride 102 96 - 112 mEq/L   CO2 24 19 - 32 mEq/L   Glucose, Bld 98 70 - 99 mg/dL   BUN 15 6 - 23 mg/dL   Creatinine, Ser 0.77 0.50 - 1.10 mg/dL   Calcium 9.8 8.4 - 10.5 mg/dL   GFR calc non Af Amer >90 >90 mL/min   GFR calc Af Amer >90 >90 mL/min    Comment: (NOTE) The eGFR has been calculated using the CKD EPI equation. This calculation has not been validated in all clinical situations. eGFR's persistently <90 mL/min signify possible Chronic Kidney Disease.    Anion gap 16 (H) 5 - 15    Ct Angio Chest Aorta W/cm &/or Wo/cm  05/01/2014   CLINICAL DATA:  58 year old female with left upper abdominal pain. Past history of colon surgery, colostomy, hernia repair and diverticulitis.  EXAM: CT ANGIOGRAPHY CHEST, ABDOMEN AND PELVIS  TECHNIQUE: Multidetector CT imaging through the chest, abdomen and pelvis was performed using the standard protocol during bolus administration of intravenous contrast. Multiplanar reconstructed images and MIPs were obtained and reviewed to evaluate the vascular anatomy.  CONTRAST:  151mL OMNIPAQUE IOHEXOL 350 MG/ML SOLN  COMPARISON:  Prior CTA abdomen and pelvis 02/07/2014 ; prior CT scan of the chest 05/23/2013   FINDINGS: CTA CHEST FINDINGS  VASCULAR  Heart/Vascular: No evidence of intramural hematoma on the non contrasted images. Normal caliber aorta without evidence of aneurysmal dilatation or dissection. The aorta is tortuous. Scattered mild atherosclerotic vascular calcifications. The main pulmonary artery is borderline enlarged. Cardiomegaly with left ventricular dilatation. No pericardial  effusion. Atherosclerotic vascular calcifications present within the left anterior descending coronary artery.  Review of the MIP images confirms the above findings.  NON VASCULAR  Mediastinum: 1.9 cm nonspecific cystic nodule within the right thyroid gland demonstrates slight interval enlargement compared to 1.6 cm in November of 2014. Additional smaller thyroid nodules bilaterally. No suspicious adenopathy. Oblong soft tissue density in the anterior mediastinum demonstrates no significant interval change compared to the prior imaging. Today at the level of the pulmonary trunk, the abnormality measures 2.7 x 1.2 cm compared to 2.8 x 1.7 cm previously. More superiorly at the level of the aortic arch, the abnormality measures 1.4 x 2.6 cm which is very similar compared to prior.  Lungs/Pleura: Mild dependent atelectasis in the lower lobes. No suspicious pulmonary nodule or mass. Decreased AP diameter of the chest due in part to marked levoconvex scoliosis of the thoracolumbar spine.  Bones/Soft Tissues: Marked levoconvex scoliosis of the thoracic spine. Dystrophic calcifications present about both shoulder joints. 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  CTA ABDOMEN AND PELVIS FINDINGS  VASCULAR  Aorta: Heterogeneous atherosclerotic calcification throughout the normal caliber aorta. No aneurysm, dissection or significant penetrating aortic ulcer.  Celiac: Widely patent. No splenic or hepatic arterial aneurysm. Conventional hepatic arterial anatomy.  SMA: Fibro fatty and calcified atherosclerotic plaque  results in mild stenosis of the proximal SMA. Distally the vessels are unremarkable.  Renals: Single dominant right renal artery. Small accessory left renal artery to the lower pole. Mild atherosclerotic vascular calcifications of the arterial origins without significant stenosis.  IMA: Patent.  Inflow: Scattered atherosclerotic vascular calcification without significant stenosis, aneurysmal dilatation or dissection.  Proximal Outflow: Widely patent and relatively spared from disease  Veins: No focal venous abnormality.  Review of the MIP images confirms the above findings.  NON-VASCULAR  Abdomen: Unremarkable CTA appearance of the stomach, duodenum, spleen, adrenal glands and pancreas. 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 Normal hepatic morphology without discrete solid lesion. Stable hepatic cysts. Stable punctate hyper enhancing focus at the hepatic dome. Gallbladder is unremarkable. No intra or extrahepatic biliary ductal dilatation.  No hydronephrosis, nephrolithiasis or evidence of pyelonephritis. Intermediate attenuation and possibly enhancing lesion exophytic from the posterior interpolar left kidney appears slightly enlarged at 15 x 13 mm compared to 14 x 12 mm previously.  High-grade small bowel obstruction. Focal transition point in the the low mid abdomen just left of midline at the L4 level. The transition point occurs as the small bowel passes over the segment of colon heading toward the left lower quadrant colostomy. No evidence of ischemia of the affected loops of bowel. No significant surrounding inflammatory change, free fluid or free air.  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 The appendix is normal.  Pelvis: Unremarkable bladder, uterus and adnexa. Mild pelvic floor laxity.  Bones/Soft Tissues: Severe levoconvex scoliosis. 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 No  definite acute osseous abnormality.  IMPRESSION: CTA CHEST  1. No acute vascular abnormality or aneurysmal dilatation. 2. Atherosclerotic vascular calcifications including coronary artery disease. 3. Cardiomegaly with left ventricular dilatation. 4. Enlarging low-attenuation nodule in the right thyroid gland compared to prior imaging. Recommend further evaluation with dedicated thyroid ultrasound as a biopsy may be warranted. 5. 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 6. Degenerative arthritis and dystrophic calcifications about both glenohumeral joints. CTA ABD/PELVIS  1. No acute vascular abnormality or aneurysmal  dilatation. 2. High-grade small bowel obstruction with focal transition point just left of midline in the mid to lower abdomen. The transition point occurs as the small bowel passes over the segment of colon passing anteriorly to the left lower quadrant abdominal wall colostomy. Differential considerations include postsurgical adhesions and internal hernia. No evidence of ischemia or other complicating feature involving the loops of bowel involved in the obstruction. No free fluid or free air. 3. Similar to perhaps slightly enlarged intermediate attenuation and possibly enhancing lesion exophytic from the posterior interpolar left kidney. Renal neoplasm can not be excluded and is suspected. Recommend further evaluation with abdominal MRI with contrast. 4. Severe levoconvex scoliosis. 5. Additional ancillary findings as above without significant interval change compared to prior imaging. These results were called by telephone at the time of interpretation on 05/01/2014 at 2:15 pm to Dr. Wyona Almas, who verbally acknowledged these results.  Signed,  Criselda Peaches, MD  [facility]  [facility]   Electronically Signed   By: Jacqulynn Cadet M.D.   On: 05/01/2014 14:18   Ct Cta Abd/pel W/cm &/or W/o  Cm  05/01/2014   CLINICAL DATA:  58 year old female with left upper abdominal pain. Past history of colon surgery, colostomy, hernia repair and diverticulitis.  EXAM: CT ANGIOGRAPHY CHEST, ABDOMEN AND PELVIS  TECHNIQUE: Multidetector CT imaging through the chest, abdomen and pelvis was performed using the standard protocol during bolus administration of intravenous contrast. Multiplanar reconstructed images and MIPs were obtained and reviewed to evaluate the vascular anatomy.  CONTRAST:  152mL OMNIPAQUE IOHEXOL 350 MG/ML SOLN  COMPARISON:  Prior CTA abdomen and pelvis 02/07/2014 ; prior CT scan of the chest 05/23/2013  FINDINGS: CTA CHEST FINDINGS  VASCULAR  Heart/Vascular: No evidence of intramural hematoma on the non contrasted images. Normal caliber aorta without evidence of aneurysmal dilatation or dissection. The aorta is tortuous. Scattered mild atherosclerotic vascular calcifications. The main pulmonary artery is borderline enlarged. Cardiomegaly with left ventricular dilatation. No pericardial effusion. Atherosclerotic vascular calcifications present within the left anterior descending coronary artery.  Review of the MIP images confirms the above findings.  NON VASCULAR  Mediastinum: 1.9 cm nonspecific cystic nodule within the right thyroid gland demonstrates slight interval enlargement compared to 1.6 cm in November of 2014. Additional smaller thyroid nodules bilaterally. No suspicious adenopathy. Oblong soft tissue density in the anterior mediastinum demonstrates no significant interval change compared to the prior imaging. Today at the level of the pulmonary trunk, the abnormality measures 2.7 x 1.2 cm compared to 2.8 x 1.7 cm previously. More superiorly at the level of the aortic arch, the abnormality measures 1.4 x 2.6 cm which is very similar compared to prior.  Lungs/Pleura: Mild dependent atelectasis in the lower lobes. No suspicious pulmonary nodule or mass. Decreased AP diameter of the chest due  in part to marked levoconvex scoliosis of the thoracolumbar spine.  Bones/Soft Tissues: Marked levoconvex scoliosis of the thoracic spine. Dystrophic calcifications present about both shoulder joints. 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  CTA ABDOMEN AND PELVIS FINDINGS  VASCULAR  Aorta: Heterogeneous atherosclerotic calcification throughout the normal caliber aorta. No aneurysm, dissection or significant penetrating aortic ulcer.  Celiac: Widely patent. No splenic or hepatic arterial aneurysm. Conventional hepatic arterial anatomy.  SMA: Fibro fatty and calcified atherosclerotic plaque results in mild stenosis of the proximal SMA. Distally the vessels are unremarkable.  Renals: Single dominant right renal artery. Small accessory left renal artery to the lower pole. Mild atherosclerotic  vascular calcifications of the arterial origins without significant stenosis.  IMA: Patent.  Inflow: Scattered atherosclerotic vascular calcification without significant stenosis, aneurysmal dilatation or dissection.  Proximal Outflow: Widely patent and relatively spared from disease  Veins: No focal venous abnormality.  Review of the MIP images confirms the above findings.  NON-VASCULAR  Abdomen: Unremarkable CTA appearance of the stomach, duodenum, spleen, adrenal glands and pancreas. 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 Normal hepatic morphology without discrete solid lesion. Stable hepatic cysts. Stable punctate hyper enhancing focus at the hepatic dome. Gallbladder is unremarkable. No intra or extrahepatic biliary ductal dilatation.  No hydronephrosis, nephrolithiasis or evidence of pyelonephritis. Intermediate attenuation and possibly enhancing lesion exophytic from the posterior interpolar left kidney appears slightly enlarged at 15 x 13 mm compared to 14 x 12 mm previously.  High-grade small bowel obstruction. Focal  transition point in the the low mid abdomen just left of midline at the L4 level. The transition point occurs as the small bowel passes over the segment of colon heading toward the left lower quadrant colostomy. No evidence of ischemia of the affected loops of bowel. No significant surrounding inflammatory change, free fluid or free air.  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 The appendix is normal.  Pelvis: Unremarkable bladder, uterus and adnexa. Mild pelvic floor laxity.  Bones/Soft Tissues: Severe levoconvex scoliosis. 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 No definite acute osseous abnormality.  IMPRESSION: CTA CHEST  1. No acute vascular abnormality or aneurysmal dilatation. 2. Atherosclerotic vascular calcifications including coronary artery disease. 3. Cardiomegaly with left ventricular dilatation. 4. Enlarging low-attenuation nodule in the right thyroid gland compared to prior imaging. Recommend further evaluation with dedicated thyroid ultrasound as a biopsy may be warranted. 5. 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 6. Degenerative arthritis and dystrophic calcifications about both glenohumeral joints. CTA ABD/PELVIS  1. No acute vascular abnormality or aneurysmal dilatation. 2. High-grade small bowel obstruction with focal transition point just left of midline in the mid to lower abdomen. The transition point occurs as the small bowel passes over the segment of colon passing anteriorly to the left lower quadrant abdominal wall colostomy. Differential considerations include postsurgical adhesions and internal hernia. No evidence of ischemia or other complicating feature involving the loops of bowel involved in the obstruction. No free fluid or free air. 3. Similar to perhaps slightly enlarged intermediate attenuation and possibly enhancing lesion exophytic from the posterior interpolar left kidney. Renal  neoplasm can not be excluded and is suspected. Recommend further evaluation with abdominal MRI with contrast. 4. Severe levoconvex scoliosis. 5. Additional ancillary findings as above without significant interval change compared to prior imaging. These results were called by telephone at the time of interpretation on 05/01/2014 at 2:15 pm to Dr. Wyona Almas, who verbally acknowledged these results.  Signed,  Criselda Peaches, MD  [facility]  [facility]   Electronically Signed   By: Jacqulynn Cadet M.D.   On: 05/01/2014 14:18    Review of Systems  Constitutional: Negative for fever and chills.  HENT:       Legally blind and hearing impaired  Eyes:       See above  Respiratory: Negative.   Cardiovascular: Positive for chest pain.  Gastrointestinal: Positive for nausea and abdominal pain.  Genitourinary: Negative.   Musculoskeletal:       RA  Skin: Negative.  Neurological: Negative.   Endo/Heme/Allergies: Negative.   Psychiatric/Behavioral: Negative.    Blood pressure 138/96, pulse 102, temperature 98.3 F (36.8 C), temperature source Oral, resp. rate 17, height 5' 8" (1.727 m), weight 140 lb (63.504 kg), last menstrual period 06/25/2006, SpO2 100 %. Physical Exam  Constitutional: No distress.  HENT:  Head: Normocephalic.  Mouth/Throat: Oropharynx is clear and moist. No oropharyngeal exudate.  Eyes: Right eye exhibits no discharge. Left eye exhibits no discharge.  Neck: Neck supple.  Cardiovascular:  Tachycardic 120s with occasional ectopy  Respiratory: Effort normal and breath sounds normal. No respiratory distress. She has no wheezes. She has no rales.  GI: Soft. She exhibits distension. There is tenderness. There is no rebound and no guarding.    Left-sided colostomy with some air and stool present, lower midline incisional hernia reduces easily, minimal tenderness, bowel sounds are present  Musculoskeletal:  Severe hand  ulnar deviation, severe scoliosis  Neurological: She is alert.  Skin: Skin is warm.  Psychiatric: She has a normal mood and affect.    Assessment/Plan: Small bowel obstruction appears similar to previous due to adhesions - agree with medical admission and IV fluids. Recommend nasogastric tube if she vomits. She claims there has been difficulty placing these in the past. Hopefully she will improve with conservative measures. We will follow closely.  Johnika Escareno E 05/01/2014, 3:02 PM

## 2014-05-01 NOTE — H&P (Signed)
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 Came to the ED with chief complaint of chest pain which started this morning. Patient also had some shortness of breath, had nausea but no vomiting. In the ED she was found to be in sinus tachycardia. Patient has a history of Marfan syndrome, CT angiogram of the chest was done which ruled out aortic dissection, CTA of the abdomen pelvis revealed high-grade small bowel obstruction. Patient has colostomy bag in place, she haddiverticulitis of colon with perforation status post colectomy/ostomy on 11/28/2012. Patient also has history of rheumatoid arthritis, and is followed by rheumatologist. She was started on isoniazid for latent tuberculosis in March of this year. She denies fever, no dysuria.  Patient lives by herself, her daughter helps patient in emptying the colostomy everyday.  Allergies:  No Known Allergies    Past Medical History  Diagnosis Date  . Legally blind     since pt was a teenager  . Hearing aid worn     pt wears bilateral hearing aids  . Marfan's syndrome affecting skin     with scolosis  . Osteoporosis   . Hypertension   . Substance abuse     recovering alcoholic F75 years   . Hernia 4/08  . 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 Chronic gout 2008  . Stroke 2/08  . Fibroid   . Status post bunionectomy 1/10    bilateral   . Diverticulitis of large intestine with perforation   . Small bowel obstruction due to  adhesions   . Rheumatoid arthritis     sees Dr. Gavin Pound   . SBO (small bowel obstruction) 01/05/2013  . Atrial flutter     Past Surgical History  Procedure Laterality Date  . Bunionectomy Bilateral 1/10  . Total knee arthroplasty Bilateral 6/08  . Hernia repair    . Laparotomy N/A 11/27/2012    Procedure: EXPLORATORY LAPAROTOMY SIGMOID COLECTOMY, COLOSTOMY;  Surgeon: Madilyn Hook, DO;  Location: [facility];  Service: General;  Laterality: N/A;  . Colostomy  11/27/2012  . Laparoscopic abdominal exploration N/A 01/06/2013    Procedure: LAPAROSCOPIC ABDOMINAL EXPLORATION;  Surgeon: Adin Hector, MD;  Location: [facility];  Service: General;  Laterality: N/A;  . Laparoscopic lysis of adhesions N/A 01/06/2013    Procedure: LAPAROSCOPIC LYSIS OF ADHESIONS/ INTEROTOMY REPAIR;  Surgeon: Adin Hector, MD;  Location: [facility];  Service: General;  Laterality: N/A;  . Colon surgery      colectomy  . Ablation  01/04/14    atrial flutter ablation (2 circuits) by Dr Lovena Le    Prior to Admission medications   Medication Sig Start Date End Date Taking? Authorizing Provider  albuterol (PROVENTIL HFA;VENTOLIN HFA) 108 (90 BASE) MCG/ACT inhaler Inhale 1 puff into the lungs every 4 (four) hours as needed for wheezing or shortness of breath. 12/29/13  Yes [name], MD  allopurinol (ZYLOPRIM) 100 MG tablet Take 100 mg by mouth daily.   Yes Historical Provider, MD  amLODipine (NORVASC) 10 MG tablet take 1 tablet by mouth once  daily 02/19/14  Yes [name], MD  Calcium Carbonate-Vitamin D (CALCIUM + D PO) Take 1,200 mg by mouth daily.   Yes Historical Provider, MD  cetirizine (ZYRTEC) 10 MG tablet Take 10 mg by mouth daily.   Yes Historical Provider, MD  Cyanocobalamin (VITAMIN B 12 PO) Take 1 tablet by mouth daily.   Yes Historical Provider, MD  cyclobenzaprine (FLEXERIL) 10 MG tablet Take 1 tablet (10 mg total) by mouth 3 (three) times daily as needed for muscle spasms. 09/21/13  Yes [name], MD    EPINEPHrine (EPIPEN 2-PAK) 0.3 mg/0.3 mL IJ SOAJ injection Inject 0.3 mLs (0.3 mg total) into the muscle once. 02/01/14  Yes [name], MD  folic acid (FOLVITE) 1 MG tablet Take 1 mg by mouth daily.   Yes Historical Provider, MD  HYDROcodone-acetaminophen (NORCO) 10-325 MG per tablet Take 1 tablet by mouth every 6 (six) hours as needed for moderate pain. 02/01/14  Yes [name], MD  isoniazid (NYDRAZID) 300 MG tablet Take 300 mg by mouth daily.   Yes Historical Provider, MD  ketoconazole (NIZORAL) 2 % cream Apply 1 application topically 2 (two) times daily. 08/10/13  Yes [name], MD  megestrol (MEGACE ES) 625 MG/5ML suspension Take 5 mL (625 mg total) by mouth 2 (two) times daily before a meal. 02/09/13  Yes [name], MD  methotrexate 25 MG/ML injection Inject 20 mg into the skin once a week. Take on Mondays   Yes Historical Provider, MD  Multiple Vitamins-Minerals (MULTIVITAMIN PO) Take 1 tablet by mouth daily.    Yes Historical Provider, MD  NEXIUM 40 MG capsule take 1 capsule by mouth twice a day 11/20/13  Yes [name], MD  pyridOXINE (VITAMIN B-6) 50 MG tablet Take 50 mg by mouth daily.   Yes Historical Provider, MD  risedronate (ACTONEL) 150 MG tablet Take 150 mg by mouth every 30 (thirty) days. with water on empty stomach, nothing by mouth or lie down for next 30 minutes.   Yes Historical Provider, MD  traMADol (ULTRAM) 50 MG tablet Take 50 mg by mouth every 6 (six) hours as needed for moderate pain.   Yes Historical Provider, MD  vitamin C (ASCORBIC ACID) 500 MG tablet Take 500 mg by mouth daily.   Yes Historical Provider, MD  metoprolol succinate (TOPROL-XL) 25 MG 24 hr tablet  12/30/13   Historical Provider, MD    Social History:  reports that she quit smoking about 17 months ago. Her smoking use included Cigarettes. She smoked 1.00 pack per day. She has never used smokeless tobacco. She reports that she does not drink alcohol or use illicit drugs.  Family History   Problem Relation Age of Onset  . Heart attack Neg Hx      All the positives are listed in BOLD  Review of Systems:  HEENT: Headache, blurred vision, runny nose, sore throat Neck: Hypothyroidism, hyperthyroidism,,lymphadenopathy Chest : Shortness of breath, history of COPD, Asthma Heart : Chest pain, history of coronary arterey disease GI:  Nausea, vomiting, diarrhea, constipation, GERD GU: Dysuria, urgency, frequency of urination, hematuria Neuro: Stroke, seizures, syncope Psych: Depression, anxiety, hallucinations   Physical Exam: Blood pressure 123/80, pulse 102, temperature 98.3 F (36.8 C), temperature source Oral, resp. rate 16, height 5\' 8"  (1.727 m), weight 63.504 kg (140 lb), last menstrual period 06/25/2006, SpO2 100 %. Constitutional:   Patient is a well-developed and well-nourished female in no acute distress and cooperative with exam. Head:  Normocephalic and atraumatic Mouth: Mucus membranes moist Eyes: PERRL, EOMI, conjunctivae normal Neck: Supple, No Thyromegaly Cardiovascular: RRR, S1 normal, S2 normal Pulmonary/Chest: CTAB, no wheezes, rales, or rhonchi Abdominal: Soft. Non-tender, non-distended,colostomy bag in place, bowel sounds are normal, no masses, organomegaly, or guarding present.  Neurological: A&O x3, Strenght is normal and symmetric bilaterally, cranial nerve II-XII are grossly intact, no focal motor deficit, sensory intact to light touch bilaterally.  Extremities : No Cyanosis, Clubbing or Edema  Labs on Admission:  Basic Metabolic Panel:  Recent Labs Lab 05/01/14 1138  NA 142  K 3.8  CL 102  CO2 24  GLUCOSE 98  BUN 15  CREATININE 0.77  CALCIUM 9.8   CBC:  Recent Labs Lab 05/01/14 1138  WBC 8.3  NEUTROABS 6.2  HGB 11.6*  HCT 35.9*  MCV 92.1  PLT 296   Cardiac Enzymes:  Recent Labs Lab 05/01/14 1138  TROPONINI <0.30    BNP (last 3 results)  Recent Labs  12/31/13 1116 01/04/14 0245  PROBNP 6068.0* 3355.0*    CBG: No results for input(s): GLUCAP in the last 168 hours.  Radiological Exams on Admission: Ct Angio Chest Aorta W/cm &/or Wo/cm  05/01/2014   CLINICAL DATA:  59 year old female with left upper abdominal pain. Past history of colon surgery, colostomy, hernia repair and diverticulitis.  EXAM: CT ANGIOGRAPHY CHEST, ABDOMEN AND PELVIS  TECHNIQUE: Multidetector CT imaging through the chest, abdomen and pelvis was performed using the standard protocol during bolus administration of intravenous contrast. Multiplanar reconstructed images and MIPs were obtained and reviewed to evaluate the vascular anatomy.  CONTRAST:  160mL OMNIPAQUE IOHEXOL 350 MG/ML SOLN  COMPARISON:  Prior CTA abdomen and pelvis 02/07/2014 ; prior CT scan of the chest 05/23/2013  FINDINGS: CTA CHEST FINDINGS  VASCULAR  Heart/Vascular: No evidence of intramural hematoma on the non contrasted images. Normal caliber aorta without evidence of aneurysmal dilatation or dissection. The aorta is tortuous. Scattered mild atherosclerotic vascular calcifications. The main pulmonary artery is borderline enlarged. Cardiomegaly with left ventricular dilatation. No pericardial effusion. Atherosclerotic vascular calcifications present within the left anterior descending coronary artery.  Review of the MIP images confirms the above findings.  NON VASCULAR  Mediastinum: 1.9 cm nonspecific cystic nodule within the right thyroid gland demonstrates slight interval enlargement compared to 1.6 cm in November of 2014. Additional smaller thyroid nodules bilaterally. No suspicious adenopathy. Oblong soft tissue density in the anterior mediastinum demonstrates no significant interval change compared to the prior imaging. Today at the level of the pulmonary trunk, the abnormality measures 2.7 x 1.2 cm compared to 2.8 x 1.7 cm previously. More superiorly at the level of the aortic arch, the abnormality measures 1.4 x 2.6 cm which is very similar compared to prior.   Lungs/Pleura: Mild dependent atelectasis in the lower lobes. No suspicious pulmonary nodule or mass. Decreased AP diameter of the chest due in part to marked levoconvex scoliosis of the thoracolumbar spine.  Bones/Soft Tissues: Marked levoconvex scoliosis of the thoracic spine. Dystrophic calcifications present about both shoulder joints. 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  CTA ABDOMEN AND PELVIS FINDINGS  VASCULAR  Aorta: Heterogeneous atherosclerotic calcification throughout the normal caliber aorta. No aneurysm, dissection or significant penetrating aortic ulcer.  Celiac: Widely patent. No splenic or hepatic arterial aneurysm. Conventional hepatic arterial anatomy.  SMA: Fibro fatty and calcified atherosclerotic plaque results in mild stenosis of the proximal SMA. Distally the vessels are unremarkable.  Renals: Single dominant right renal artery. Small accessory left renal  artery to the lower pole. Mild atherosclerotic vascular calcifications of the arterial origins without significant stenosis.  IMA: Patent.  Inflow: Scattered atherosclerotic vascular calcification without significant stenosis, aneurysmal dilatation or dissection.  Proximal Outflow: Widely patent and relatively spared from disease  Veins: No focal venous abnormality.  Review of the MIP images confirms the above findings.  NON-VASCULAR  Abdomen: Unremarkable CTA appearance of the stomach, duodenum, spleen, adrenal glands and pancreas. 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 Normal hepatic morphology without discrete solid lesion. Stable hepatic cysts. Stable punctate hyper enhancing focus at the hepatic dome. Gallbladder is unremarkable. No intra or extrahepatic biliary ductal dilatation.  No hydronephrosis, nephrolithiasis or evidence of pyelonephritis. Intermediate attenuation and possibly enhancing lesion exophytic from the posterior  interpolar left kidney appears slightly enlarged at 15 x 13 mm compared to 14 x 12 mm previously.  High-grade small bowel obstruction. Focal transition point in the the low mid abdomen just left of midline at the L4 level. The transition point occurs as the small bowel passes over the segment of colon heading toward the left lower quadrant colostomy. No evidence of ischemia of the affected loops of bowel. No significant surrounding inflammatory change, free fluid or free air.  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 The appendix is normal.  Pelvis: Unremarkable bladder, uterus and adnexa. Mild pelvic floor laxity.  Bones/Soft Tissues: Severe levoconvex scoliosis. 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 No definite acute osseous abnormality.  IMPRESSION: CTA CHEST  1. No acute vascular abnormality or aneurysmal dilatation. 2. Atherosclerotic vascular calcifications including coronary artery disease. 3. Cardiomegaly with left ventricular dilatation. 4. Enlarging low-attenuation nodule in the right thyroid gland compared to prior imaging. Recommend further evaluation with dedicated thyroid ultrasound as a biopsy may be warranted. 5. 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 6. Degenerative arthritis and dystrophic calcifications about both glenohumeral joints. CTA ABD/PELVIS  1. No acute vascular abnormality or aneurysmal dilatation. 2. High-grade small bowel obstruction with focal transition point just left of midline in the mid to lower abdomen. The transition point occurs as the small bowel passes over the segment of colon passing anteriorly to the left lower quadrant abdominal wall colostomy. Differential considerations include postsurgical adhesions and internal hernia. No evidence of ischemia or other complicating feature involving the loops of bowel involved in the obstruction. No free fluid or free air. 3. Similar to  perhaps slightly enlarged intermediate attenuation and possibly enhancing lesion exophytic from the posterior interpolar left kidney. Renal neoplasm can not be excluded and is suspected. Recommend further evaluation with abdominal MRI with contrast. 4. Severe levoconvex scoliosis. 5. Additional ancillary findings as above without significant interval change compared to prior imaging. These results were called by telephone at the time of interpretation on 05/01/2014 at 2:15 pm to Dr. Wyona Almas, who verbally acknowledged these results.  Signed,  Criselda Peaches, MD  [facility]  [facility]   Electronically Signed   By: Jacqulynn Cadet M.D.   On: 05/01/2014 14:18   Ct Cta Abd/pel W/cm &/or W/o Cm  05/01/2014   CLINICAL DATA:  58 year old female with left upper abdominal pain. Past history of colon surgery, colostomy, hernia repair and diverticulitis.  EXAM: CT ANGIOGRAPHY CHEST, ABDOMEN AND PELVIS  TECHNIQUE: Multidetector CT imaging through the chest, abdomen and pelvis was performed using the standard protocol during bolus administration of intravenous  contrast. Multiplanar reconstructed images and MIPs were obtained and reviewed to evaluate the vascular anatomy.  CONTRAST:  14mL OMNIPAQUE IOHEXOL 350 MG/ML SOLN  COMPARISON:  Prior CTA abdomen and pelvis 02/07/2014 ; prior CT scan of the chest 05/23/2013  FINDINGS: CTA CHEST FINDINGS  VASCULAR  Heart/Vascular: No evidence of intramural hematoma on the non contrasted images. Normal caliber aorta without evidence of aneurysmal dilatation or dissection. The aorta is tortuous. Scattered mild atherosclerotic vascular calcifications. The main pulmonary artery is borderline enlarged. Cardiomegaly with left ventricular dilatation. No pericardial effusion. Atherosclerotic vascular calcifications present within the left anterior descending coronary artery.  Review of the MIP images confirms the above findings.  NON  VASCULAR  Mediastinum: 1.9 cm nonspecific cystic nodule within the right thyroid gland demonstrates slight interval enlargement compared to 1.6 cm in November of 2014. Additional smaller thyroid nodules bilaterally. No suspicious adenopathy. Oblong soft tissue density in the anterior mediastinum demonstrates no significant interval change compared to the prior imaging. Today at the level of the pulmonary trunk, the abnormality measures 2.7 x 1.2 cm compared to 2.8 x 1.7 cm previously. More superiorly at the level of the aortic arch, the abnormality measures 1.4 x 2.6 cm which is very similar compared to prior.  Lungs/Pleura: Mild dependent atelectasis in the lower lobes. No suspicious pulmonary nodule or mass. Decreased AP diameter of the chest due in part to marked levoconvex scoliosis of the thoracolumbar spine.  Bones/Soft Tissues: Marked levoconvex scoliosis of the thoracic spine. Dystrophic calcifications present about both shoulder joints. 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  CTA ABDOMEN AND PELVIS FINDINGS  VASCULAR  Aorta: Heterogeneous atherosclerotic calcification throughout the normal caliber aorta. No aneurysm, dissection or significant penetrating aortic ulcer.  Celiac: Widely patent. No splenic or hepatic arterial aneurysm. Conventional hepatic arterial anatomy.  SMA: Fibro fatty and calcified atherosclerotic plaque results in mild stenosis of the proximal SMA. Distally the vessels are unremarkable.  Renals: Single dominant right renal artery. Small accessory left renal artery to the lower pole. Mild atherosclerotic vascular calcifications of the arterial origins without significant stenosis.  IMA: Patent.  Inflow: Scattered atherosclerotic vascular calcification without significant stenosis, aneurysmal dilatation or dissection.  Proximal Outflow: Widely patent and relatively spared from disease  Veins: No focal venous abnormality.  Review of the MIP images confirms  the above findings.  NON-VASCULAR  Abdomen: Unremarkable CTA appearance of the stomach, duodenum, spleen, adrenal glands and pancreas. 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 Normal hepatic morphology without discrete solid lesion. Stable hepatic cysts. Stable punctate hyper enhancing focus at the hepatic dome. Gallbladder is unremarkable. No intra or extrahepatic biliary ductal dilatation.  No hydronephrosis, nephrolithiasis or evidence of pyelonephritis. Intermediate attenuation and possibly enhancing lesion exophytic from the posterior interpolar left kidney appears slightly enlarged at 15 x 13 mm compared to 14 x 12 mm previously.  High-grade small bowel obstruction. Focal transition point in the the low mid abdomen just left of midline at the L4 level. The transition point occurs as the small bowel passes over the segment of colon heading toward the left lower quadrant colostomy. No evidence of ischemia of the affected loops of bowel. No significant surrounding inflammatory change, free fluid or free air.  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 The appendix is normal.  Pelvis: Unremarkable bladder, uterus and adnexa. Mild pelvic floor laxity.  Bones/Soft Tissues: Severe levoconvex scoliosis. Advanced multilevel degenerative disc disease  and facet arthropathy. No definite acute osseous abnormality.  IMPRESSION: CTA CHEST  1. No acute vascular abnormality or aneurysmal dilatation. 2. Atherosclerotic vascular calcifications including coronary artery disease. 3. Cardiomegaly with left ventricular dilatation. 4. Enlarging low-attenuation nodule in the right thyroid gland compared to prior imaging. Recommend further evaluation with dedicated thyroid ultrasound as a biopsy may be warranted. 5. 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 6. Degenerative arthritis and dystrophic  calcifications about both glenohumeral joints. CTA ABD/PELVIS  1. No acute vascular abnormality or aneurysmal dilatation. 2. High-grade small bowel obstruction with focal transition point just left of midline in the mid to lower abdomen. The transition point occurs as the small bowel passes over the segment of colon passing anteriorly to the left lower quadrant abdominal wall colostomy. Differential considerations include postsurgical adhesions and internal hernia. No evidence of ischemia or other complicating feature involving the loops of bowel involved in the obstruction. No free fluid or free air. 3. Similar to perhaps slightly enlarged intermediate attenuation and possibly enhancing lesion exophytic from the posterior interpolar left kidney. Renal neoplasm can not be excluded and is suspected. Recommend further evaluation with abdominal MRI with contrast. 4. Severe levoconvex scoliosis. 5. Additional ancillary findings as above without significant interval change compared to prior imaging. These results were called by telephone at the time of interpretation on 05/01/2014 at 2:15 pm to Dr. Wyona Almas, who verbally acknowledged these results.  Signed,  Criselda Peaches, MD  [facility]  [facility]   Electronically Signed   By: Jacqulynn Cadet M.D.   On: 05/01/2014 14:18    EKG: Independently reviewed. Sinus arrhythmia, tachycardia   Assessment/Plan Active Problems:   Blindness   Essential hypertension   Marfan's syndrome   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 11/28/2012   Tachycardia   Small bowel obstruction    Small bowel obstruction Patient has high-grade small bowel obstruction, seen on the CT abdomen pelvis. Surgery has been consulted by the ED physician, Dr. Georganna Skeans to see the patient.We'll keep the patient nothing by mouth, she is not vomiting at this time. Will obtain abdominal x-rays in a.m.  Essential  hypertension Patient takes amlodipine and metoprolol at home, as she is nothing by mouth we'll start her on IV metoprolol 5 mg every 6 hours.  History of latent TB Patient has been taking isoniazid since March 2015, I called and discussed with Dr. Megan Salon from infectious disease, patient does not need isolation. Will hold isoniazid at this time, as patient is nothing by mouth for SBO. Restart isoniazid when patient able to take by mouth.  Questionable lesion in the left kidney CT abdomen reveals exophytic lesion from the posterior interpolar left kidney, she will need MRI of the abdomen once more stable.  Sinus tachycardia Patient has multiple PVCs on the EKG, along with sinus tachycardia. Will monitor the patient on telemetry will start on metoprolol 5 g IV every 6 hours  Rheumatoid arthritis Patient takes methotrexate injection once a week, will continue with Tylenol per rectum, as well as IV morphine when necessary for pain  GERD Patient takes Nexium at home, will start IV Protonix in the hospital.  DVT prophylaxis Lovenox  Code status:Patient is full code  Family discussion: Admission, patients condition and plan of care including tests being ordered have been discussed with the patient and *her daughter Maiyah Goyne on phone.   Time Spent on Admission: 65 minutes  Kelbi Renstrom S  [facility] Pager: [phone] 05/01/2014, 3:30 PM  If 7PM-7AM, please contact night-coverage  [email or web address]  Password [email or web address]

## 2014-05-02 ENCOUNTER — Inpatient Hospital Stay (HOSPITAL_COMMUNITY): Payer: Medicare Other

## 2014-05-02 DIAGNOSIS — K56609 Unspecified intestinal obstruction, unspecified as to partial versus complete obstruction: Secondary | ICD-10-CM | POA: Diagnosis present

## 2014-05-02 LAB — COMPREHENSIVE METABOLIC PANEL
ALT: 17 U/L (ref 0–35)
AST: 27 U/L (ref 0–37)
Albumin: 3.2 g/dL — ABNORMAL LOW (ref 3.5–5.2)
Alkaline Phosphatase: 96 U/L (ref 39–117)
Anion gap: 13 (ref 5–15)
BUN: 16 mg/dL (ref 6–23)
CO2: 23 mEq/L (ref 19–32)
Calcium: 9 mg/dL (ref 8.4–10.5)
Chloride: 106 mEq/L (ref 96–112)
Creatinine, Ser: 0.79 mg/dL (ref 0.50–1.10)
GFR calc Af Amer: >90 mL/min (ref >90)
GFR calc non Af Amer: >90 mL/min (ref >90)
Glucose, Bld: 103 mg/dL — ABNORMAL HIGH (ref 70–99)
Potassium: 4.5 mEq/L (ref 3.7–5.3)
Sodium: 142 mEq/L (ref 137–147)
Total Bilirubin: 0.6 mg/dL (ref 0.3–1.2)
Total Protein: 6.3 g/dL (ref 6.0–8.3)

## 2014-05-02 LAB — CBC
HCT: 35.8 % — ABNORMAL LOW (ref 36.0–46.0)
Hemoglobin: 11.3 g/dL — ABNORMAL LOW (ref 12.0–15.0)
MCH: 29.5 pg (ref 26.0–34.0)
MCHC: 31.6 g/dL (ref 30.0–36.0)
MCV: 93.5 fL (ref 78.0–100.0)
Platelets: 294 10*3/uL (ref 150–400)
RBC: 3.83 MIL/uL — ABNORMAL LOW (ref 3.87–5.11)
RDW: 14.4 % (ref 11.5–15.5)
WBC: 5.9 10*3/uL (ref 4.0–10.5)

## 2014-05-02 LAB — TROPONIN I: Troponin I: <0.3 ng/mL (ref <0.30)

## 2014-05-02 MED ORDER — ONDANSETRON HCL 4 MG/2ML IJ SOLN
4.0000 mg | INTRAMUSCULAR | Status: DC | PRN
  Administered 2014-05-03 – 2014-05-04 (×6): 4 mg via INTRAVENOUS
  Filled 2014-05-02 (×5): qty 2

## 2014-05-02 MED ORDER — ONDANSETRON HCL 4 MG PO TABS: 4.0000 mg | ORAL_TABLET | ORAL | Status: DC | PRN

## 2014-05-02 MED ORDER — DILTIAZEM LOAD VIA INFUSION
5.0000 mg | Freq: Once | INTRAVENOUS | Status: AC
  Administered 2014-05-02: 5 mg via INTRAVENOUS
  Filled 2014-05-02: qty 5

## 2014-05-02 MED ORDER — DILTIAZEM HCL 100 MG IV SOLR
5.0000 mg/h | INTRAVENOUS | Status: DC
  Administered 2014-05-02: 5 mg/h via INTRAVENOUS
  Administered 2014-05-03 – 2014-05-04 (×5): 15 mg/h via INTRAVENOUS
  Administered 2014-05-05: 5 mg/h via INTRAVENOUS
  Administered 2014-05-05: 10 mg/h via INTRAVENOUS
  Administered 2014-05-06 – 2014-05-07 (×2): 5 mg/h via INTRAVENOUS
  Filled 2014-05-02 (×3): qty 100

## 2014-05-02 MED ORDER — METOPROLOL TARTRATE 1 MG/ML IV SOLN
5.0000 mg | Freq: Once | INTRAVENOUS | Status: AC
  Administered 2014-05-02: 5 mg via INTRAVENOUS

## 2014-05-02 NOTE — Progress Notes (Signed)
Kathline Magic NP notified of ongoing Afib with frequent PVC's , lopressor 5 mg IV adm. Will continue to monitor closely

## 2014-05-02 NOTE — Progress Notes (Signed)
Kathline Magic NP notified of ongoing A-fib. Orders written for IV Cardizem, bolus followed by a drip.

## 2014-05-02 NOTE — Progress Notes (Signed)
[facility] Progress Note     Subjective: Pt says she has central abdominal pain.  Continues to be nauseated and threw up several times today.  Says she's having flatus out of her bag.  Some stool output in bag, but the patient says it hasn't been changed recently.  Objective: Vital signs in last 24 hours: Temp:  [98.1 F (36.7 C)-98.7 F (37.1 C)] 98.1 F (36.7 C) (11/08 0535) Pulse Rate:  [52-131] 121 (11/08 0535) Resp:  [12-30] 19 (11/08 0535) BP: (109-144)/(61-121) 110/84 mmHg (11/08 0535) SpO2:  [94 %-100 %] 94 % (11/08 0535) Weight:  [140 lb (63.504 kg)-144 lb 4.8 oz (65.454 kg)] 144 lb 4.8 oz (65.454 kg) (11/07 1619) Last BM Date: 05/01/14  Intake/Output from previous day: 11/07 0701 - 11/08 0700 In: 1328.3 [I.V.:1328.3] Out: 800 [Urine:800] Intake/Output this shift:    PE: Gen:  Alert, NAD, pleasant Abd: Soft, distended, tender in the central abdomen, +BS, no HSM, abdominal scars noted, ostomy with brown loose stool and some flatus (but unsure when the bag was last changed)   Lab Results:   Recent Labs  05/01/14 1820 05/02/14 0357  WBC 6.9 5.9  HGB 11.5* 11.3*  HCT 35.8* 35.8*  PLT 273 294   BMET  Recent Labs  05/01/14 1138 05/01/14 1820 05/02/14 0357  NA 142  --  142  K 3.8  --  4.5  CL 102  --  106  CO2 24  --  23  GLUCOSE 98  --  103*  BUN 15  --  16  CREATININE 0.77 0.79 0.79  CALCIUM 9.8  --  9.0   PT/INR No results for input(s): LABPROT, INR in the last 72 hours. CMP     Component Value Date/Time   NA 142 05/02/2014 0357   K 4.5 05/02/2014 0357   CL 106 05/02/2014 0357   CO2 23 05/02/2014 0357   GLUCOSE 103* 05/02/2014 0357   BUN 16 05/02/2014 0357   CREATININE 0.79 05/02/2014 0357   CALCIUM 9.0 05/02/2014 0357   PROT 6.3 05/02/2014 0357   ALBUMIN 3.2* 05/02/2014 0357   AST 27 05/02/2014 0357   ALT 17 05/02/2014 0357   ALKPHOS 96 05/02/2014 0357   BILITOT 0.6 05/02/2014 0357   GFRNONAA >90 05/02/2014 0357    GFRAA >90 05/02/2014 0357   Lipase     Component Value Date/Time   LIPASE 71* 02/25/2013 2121       Studies/Results: Ct Angio Chest Aorta W/cm &/or Wo/cm  05/01/2014   CLINICAL DATA:  58 year old female with left upper abdominal pain. Past history of colon surgery, colostomy, hernia repair and diverticulitis.  EXAM: CT ANGIOGRAPHY CHEST, ABDOMEN AND PELVIS  TECHNIQUE: Multidetector CT imaging through the chest, abdomen and pelvis was performed using the standard protocol during bolus administration of intravenous contrast. Multiplanar reconstructed images and MIPs were obtained and reviewed to evaluate the vascular anatomy.  CONTRAST:  121mL OMNIPAQUE IOHEXOL 350 MG/ML SOLN  COMPARISON:  Prior CTA abdomen and pelvis 02/07/2014 ; prior CT scan of the chest 05/23/2013  FINDINGS: CTA CHEST FINDINGS  VASCULAR  Heart/Vascular: No evidence of intramural hematoma on the non contrasted images. Normal caliber aorta without evidence of aneurysmal dilatation or dissection. The aorta is tortuous. Scattered mild atherosclerotic vascular calcifications. The main pulmonary artery is borderline enlarged. Cardiomegaly with left ventricular dilatation. No pericardial effusion. Atherosclerotic vascular calcifications present within the left anterior descending coronary artery.  Review of the MIP images confirms the above findings.  NON  VASCULAR  Mediastinum: 1.9 cm nonspecific cystic nodule within the right thyroid gland demonstrates slight interval enlargement compared to 1.6 cm in November of 2014. Additional smaller thyroid nodules bilaterally. No suspicious adenopathy. Oblong soft tissue density in the anterior mediastinum demonstrates no significant interval change compared to the prior imaging. Today at the level of the pulmonary trunk, the abnormality measures 2.7 x 1.2 cm compared to 2.8 x 1.7 cm previously. More superiorly at the level of the aortic arch, the abnormality measures 1.4 x 2.6 cm which is very  similar compared to prior.  Lungs/Pleura: Mild dependent atelectasis in the lower lobes. No suspicious pulmonary nodule or mass. Decreased AP diameter of the chest due in part to marked levoconvex scoliosis of the thoracolumbar spine.  Bones/Soft Tissues: Marked levoconvex scoliosis of the thoracic spine. Dystrophic calcifications present about both shoulder joints. 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  CTA ABDOMEN AND PELVIS FINDINGS  VASCULAR  Aorta: Heterogeneous atherosclerotic calcification throughout the normal caliber aorta. No aneurysm, dissection or significant penetrating aortic ulcer.  Celiac: Widely patent. No splenic or hepatic arterial aneurysm. Conventional hepatic arterial anatomy.  SMA: Fibro fatty and calcified atherosclerotic plaque results in mild stenosis of the proximal SMA. Distally the vessels are unremarkable.  Renals: Single dominant right renal artery. Small accessory left renal artery to the lower pole. Mild atherosclerotic vascular calcifications of the arterial origins without significant stenosis.  IMA: Patent.  Inflow: Scattered atherosclerotic vascular calcification without significant stenosis, aneurysmal dilatation or dissection.  Proximal Outflow: Widely patent and relatively spared from disease  Veins: No focal venous abnormality.  Review of the MIP images confirms the above findings.  NON-VASCULAR  Abdomen: Unremarkable CTA appearance of the stomach, duodenum, spleen, adrenal glands and pancreas. 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 Normal hepatic morphology without discrete solid lesion. Stable hepatic cysts. Stable punctate hyper enhancing focus at the hepatic dome. Gallbladder is unremarkable. No intra or extrahepatic biliary ductal dilatation.  No hydronephrosis, nephrolithiasis or evidence of pyelonephritis. Intermediate attenuation and possibly enhancing lesion  exophytic from the posterior interpolar left kidney appears slightly enlarged at 15 x 13 mm compared to 14 x 12 mm previously.  High-grade small bowel obstruction. Focal transition point in the the low mid abdomen just left of midline at the L4 level. The transition point occurs as the small bowel passes over the segment of colon heading toward the left lower quadrant colostomy. No evidence of ischemia of the affected loops of bowel. No significant surrounding inflammatory change, free fluid or free air.  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 The appendix is normal.  Pelvis: Unremarkable bladder, uterus and adnexa. Mild pelvic floor laxity.  Bones/Soft Tissues: Severe levoconvex scoliosis. 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 No definite acute osseous abnormality.  IMPRESSION: CTA CHEST  1. No acute vascular abnormality or aneurysmal dilatation. 2. Atherosclerotic vascular calcifications including coronary artery disease. 3. Cardiomegaly with left ventricular dilatation. 4. Enlarging low-attenuation nodule in the right thyroid gland compared to prior imaging. Recommend further evaluation with dedicated thyroid ultrasound as a biopsy may be warranted. 5. 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 6. Degenerative arthritis and dystrophic calcifications about both glenohumeral joints. CTA ABD/PELVIS  1. No acute vascular abnormality or aneurysmal dilatation. 2. High-grade small bowel obstruction with focal transition point just left of midline in the mid to lower abdomen. The transition point occurs  as the small bowel passes over the segment of colon passing anteriorly to the left lower quadrant abdominal wall colostomy. Differential considerations include postsurgical adhesions and internal hernia. No evidence of ischemia or other complicating feature involving the loops of bowel involved in the obstruction. No free  fluid or free air. 3. Similar to perhaps slightly enlarged intermediate attenuation and possibly enhancing lesion exophytic from the posterior interpolar left kidney. Renal neoplasm can not be excluded and is suspected. Recommend further evaluation with abdominal MRI with contrast. 4. Severe levoconvex scoliosis. 5. Additional ancillary findings as above without significant interval change compared to prior imaging. These results were called by telephone at the time of interpretation on 05/01/2014 at 2:15 pm to Dr. Wyona Almas, who verbally acknowledged these results.  Signed,  Criselda Peaches, MD  [facility]  [facility]   Electronically Signed   By: Jacqulynn Cadet M.D.   On: 05/01/2014 14:18   Ct Cta Abd/pel W/cm &/or W/o Cm  05/01/2014   CLINICAL DATA:  58 year old female with left upper abdominal pain. Past history of colon surgery, colostomy, hernia repair and diverticulitis.  EXAM: CT ANGIOGRAPHY CHEST, ABDOMEN AND PELVIS  TECHNIQUE: Multidetector CT imaging through the chest, abdomen and pelvis was performed using the standard protocol during bolus administration of intravenous contrast. Multiplanar reconstructed images and MIPs were obtained and reviewed to evaluate the vascular anatomy.  CONTRAST:  149mL OMNIPAQUE IOHEXOL 350 MG/ML SOLN  COMPARISON:  Prior CTA abdomen and pelvis 02/07/2014 ; prior CT scan of the chest 05/23/2013  FINDINGS: CTA CHEST FINDINGS  VASCULAR  Heart/Vascular: No evidence of intramural hematoma on the non contrasted images. Normal caliber aorta without evidence of aneurysmal dilatation or dissection. The aorta is tortuous. Scattered mild atherosclerotic vascular calcifications. The main pulmonary artery is borderline enlarged. Cardiomegaly with left ventricular dilatation. No pericardial effusion. Atherosclerotic vascular calcifications present within the left anterior descending coronary artery.  Review of the MIP images  confirms the above findings.  NON VASCULAR  Mediastinum: 1.9 cm nonspecific cystic nodule within the right thyroid gland demonstrates slight interval enlargement compared to 1.6 cm in November of 2014. Additional smaller thyroid nodules bilaterally. No suspicious adenopathy. Oblong soft tissue density in the anterior mediastinum demonstrates no significant interval change compared to the prior imaging. Today at the level of the pulmonary trunk, the abnormality measures 2.7 x 1.2 cm compared to 2.8 x 1.7 cm previously. More superiorly at the level of the aortic arch, the abnormality measures 1.4 x 2.6 cm which is very similar compared to prior.  Lungs/Pleura: Mild dependent atelectasis in the lower lobes. No suspicious pulmonary nodule or mass. Decreased AP diameter of the chest due in part to marked levoconvex scoliosis of the thoracolumbar spine.  Bones/Soft Tissues: Marked levoconvex scoliosis of the thoracic spine. Dystrophic calcifications present about both shoulder joints. 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  CTA ABDOMEN AND PELVIS FINDINGS  VASCULAR  Aorta: Heterogeneous atherosclerotic calcification throughout the normal caliber aorta. No aneurysm, dissection or significant penetrating aortic ulcer.  Celiac: Widely patent. No splenic or hepatic arterial aneurysm. Conventional hepatic arterial anatomy.  SMA: Fibro fatty and calcified atherosclerotic plaque results in mild stenosis of the proximal SMA. Distally the vessels are unremarkable.  Renals: Single dominant right renal artery. Small accessory left renal artery to the lower pole. Mild atherosclerotic vascular calcifications of the arterial origins without significant stenosis.  IMA: Patent.  Inflow: Scattered atherosclerotic vascular calcification without significant stenosis, aneurysmal dilatation or  dissection.  Proximal Outflow: Widely patent and relatively spared from disease  Veins: No focal venous abnormality.   Review of the MIP images confirms the above findings.  NON-VASCULAR  Abdomen: Unremarkable CTA appearance of the stomach, duodenum, spleen, adrenal glands and pancreas. 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 Normal hepatic morphology without discrete solid lesion. Stable hepatic cysts. Stable punctate hyper enhancing focus at the hepatic dome. Gallbladder is unremarkable. No intra or extrahepatic biliary ductal dilatation.  No hydronephrosis, nephrolithiasis or evidence of pyelonephritis. Intermediate attenuation and possibly enhancing lesion exophytic from the posterior interpolar left kidney appears slightly enlarged at 15 x 13 mm compared to 14 x 12 mm previously.  High-grade small bowel obstruction. Focal transition point in the the low mid abdomen just left of midline at the L4 level. The transition point occurs as the small bowel passes over the segment of colon heading toward the left lower quadrant colostomy. No evidence of ischemia of the affected loops of bowel. No significant surrounding inflammatory change, free fluid or free air.  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 The appendix is normal.  Pelvis: Unremarkable bladder, uterus and adnexa. Mild pelvic floor laxity.  Bones/Soft Tissues: Severe levoconvex scoliosis. 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 No definite acute osseous abnormality.  IMPRESSION: CTA CHEST  1. No acute vascular abnormality or aneurysmal dilatation. 2. Atherosclerotic vascular calcifications including coronary artery disease. 3. Cardiomegaly with left ventricular dilatation. 4. Enlarging low-attenuation nodule in the right thyroid gland compared to prior imaging. Recommend further evaluation with dedicated thyroid ultrasound as a biopsy may be warranted. 5. 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 6.  Degenerative arthritis and dystrophic calcifications about both glenohumeral joints. CTA ABD/PELVIS  1. No acute vascular abnormality or aneurysmal dilatation. 2. High-grade small bowel obstruction with focal transition point just left of midline in the mid to lower abdomen. The transition point occurs as the small bowel passes over the segment of colon passing anteriorly to the left lower quadrant abdominal wall colostomy. Differential considerations include postsurgical adhesions and internal hernia. No evidence of ischemia or other complicating feature involving the loops of bowel involved in the obstruction. No free fluid or free air. 3. Similar to perhaps slightly enlarged intermediate attenuation and possibly enhancing lesion exophytic from the posterior interpolar left kidney. Renal neoplasm can not be excluded and is suspected. Recommend further evaluation with abdominal MRI with contrast. 4. Severe levoconvex scoliosis. 5. Additional ancillary findings as above without significant interval change compared to prior imaging. These results were called by telephone at the time of interpretation on 05/01/2014 at 2:15 pm to Dr. Wyona Almas, who verbally acknowledged these results.  Signed,  Criselda Peaches, MD  [facility]  [facility]   Electronically Signed   By: Jacqulynn Cadet M.D.   On: 05/01/2014 14:18    Anti-infectives: Anti-infectives    None       Assessment/Plan SBO likely due to adhesions Multiple other medical problems - per medicine team  Plan: 1.  IVF, pain control, antiemetics, NPO, place NG by fleuro since she's been difficult to place an NG in the past 2.  Ambulate and IS, assistance may be needed due to the patients visual/hearing limitations 3.  SCD's and lovenox 4.  Hopefully this will resolve conservatively without surgical intervention 5.  Discussed with nursing staff about getting accurate output measurements  in ostomy  bag.    LOS: 1 day    Coralie Keens 05/02/2014, 7:54 AM Pager: [phone]

## 2014-05-02 NOTE — Progress Notes (Addendum)
[facility] PROGRESS NOTE  [name] GDJ:[id number] DOB: [birth year] DOA: 05/01/2014 PCP: [name], MD  Assessment/Plan: Small bowel obstruction -high-grade small bowel obstruction, due to adhesions -Surgery following, place NG under fluoro, unable to be placed at bedside despite multiple attempts -NPO, IVF, may end up needing surgery again due to high grade nature and transition point  Essential hypertension Patient takes amlodipine and metoprolol at home, as she is NPO -now on IV metoprolol 5 mg every 6 hours.  History of latent TB Patient has been taking isoniazid since March 2015, Dr.Lama called and discussed with Dr. Megan Salon from infectious disease, patient does not need isolation. Will hold isoniazid at this time, as patient is nothing by mouth for SBO. Restart isoniazid when patient able to take PO  Questionable lesion in the left kidney CT abdomen reveals exophytic lesion from the posterior interpolar left kidney, she will need MRI of the abdomen once more stable.  Thyroid nodule -needs workup as outpatient  Sinus tachycardia -due to 1, continue metoprolol IV  Rheumatoid arthritis Patient takes methotrexate injection once a week, will continue with Tylenol PRN rectally, as well as IV morphine PRN  GERD IV Protonix for now.  DVT prophylaxis Lovenox  Code status:Patient is full code Family discussion: none at bedside Dispo: when stable  Consultants:  [facility]  HPI/Subjective: Vomited this am, complains of abd discomfort  Objective: Filed Vitals:   05/02/14 0900  BP:   Pulse:   Temp:   Resp: 13    Intake/Output Summary (Last 24 hours) at 05/02/14 1018 Last data filed at 05/02/14 0900  Gross per 24 hour  Intake 1628.33 ml  Output    800 ml  Net 828.33 ml   Filed Weights   05/01/14 1058 05/01/14 1619  Weight: 63.504 kg (140 lb) 65.454 kg (144 lb 4.8 oz)    Exam:   General: AAOx3, chronically ill appearing  Skeletal deformities  noted  HEENT: blind  Cardiovascular: S1S2/RRR  Respiratory: CTAB  Abdomen: soft, mildly distended, colostomy, BS heard  Musculoskeletal: no edema c/c, deformities noted   Data Reviewed: Basic Metabolic Panel:  Recent Labs Lab 05/01/14 1138 05/01/14 1820 05/02/14 0357  NA 142  --  142  K 3.8  --  4.5  CL 102  --  106  CO2 24  --  23  GLUCOSE 98  --  103*  BUN 15  --  16  CREATININE 0.77 0.79 0.79  CALCIUM 9.8  --  9.0   Liver Function Tests:  Recent Labs Lab 05/02/14 0357  AST 27  ALT 17  ALKPHOS 96  BILITOT 0.6  PROT 6.3  ALBUMIN 3.2*   No results for input(s): LIPASE, AMYLASE in the last 168 hours. No results for input(s): AMMONIA in the last 168 hours. CBC:  Recent Labs Lab 05/01/14 1138 05/01/14 1820 05/02/14 0357  WBC 8.3 6.9 5.9  NEUTROABS 6.2  --   --   HGB 11.6* 11.5* 11.3*  HCT 35.9* 35.8* 35.8*  MCV 92.1 94.0 93.5  PLT 296 273 294   Cardiac Enzymes:  Recent Labs Lab 05/01/14 1138 05/01/14 1820 05/01/14 2250 05/02/14 0357  TROPONINI <0.30 <0.30 <0.30 <0.30   BNP (last 3 results)  Recent Labs  12/31/13 1116 01/04/14 0245  PROBNP 6068.0* 3355.0*   CBG: No results for input(s): GLUCAP in the last 168 hours.  No results found for this or any previous visit (from the past 240 hour(s)).   Studies: Dg Abd 2 Views  05/02/2014  CLINICAL DATA:  Abdominal pain, small bowel obstruction  EXAM: ABDOMEN - 2 VIEW  COMPARISON:  CT 05/01/2014  FINDINGS: No free air. Slight decrease in caliber of dilated small bowel loops, now measuring 3.7 cm maximally over the mid low abdomen. Retained contrast within the bladder. Severe leftward scoliosis and multilevel degenerative change noted with severe right hip degenerative change.  IMPRESSION: Improving gaseous distention of loops of small bowel compatible with small bowel obstruction.   Electronically Signed   By: Conchita Paris M.D.   On: 05/02/2014 08:22   Ct Angio Chest Aorta W/cm &/or  Wo/cm  05/01/2014   CLINICAL DATA:  58 year old female with left upper abdominal pain. Past history of colon surgery, colostomy, hernia repair and diverticulitis.  EXAM: CT ANGIOGRAPHY CHEST, ABDOMEN AND PELVIS  TECHNIQUE: Multidetector CT imaging through the chest, abdomen and pelvis was performed using the standard protocol during bolus administration of intravenous contrast. Multiplanar reconstructed images and MIPs were obtained and reviewed to evaluate the vascular anatomy.  CONTRAST:  195mL OMNIPAQUE IOHEXOL 350 MG/ML SOLN  COMPARISON:  Prior CTA abdomen and pelvis 02/07/2014 ; prior CT scan of the chest 05/23/2013  FINDINGS: CTA CHEST FINDINGS  VASCULAR  Heart/Vascular: No evidence of intramural hematoma on the non contrasted images. Normal caliber aorta without evidence of aneurysmal dilatation or dissection. The aorta is tortuous. Scattered mild atherosclerotic vascular calcifications. The main pulmonary artery is borderline enlarged. Cardiomegaly with left ventricular dilatation. No pericardial effusion. Atherosclerotic vascular calcifications present within the left anterior descending coronary artery.  Review of the MIP images confirms the above findings.  NON VASCULAR  Mediastinum: 1.9 cm nonspecific cystic nodule within the right thyroid gland demonstrates slight interval enlargement compared to 1.6 cm in November of 2014. Additional smaller thyroid nodules bilaterally. No suspicious adenopathy. Oblong soft tissue density in the anterior mediastinum demonstrates no significant interval change compared to the prior imaging. Today at the level of the pulmonary trunk, the abnormality measures 2.7 x 1.2 cm compared to 2.8 x 1.7 cm previously. More superiorly at the level of the aortic arch, the abnormality measures 1.4 x 2.6 cm which is very similar compared to prior.  Lungs/Pleura: Mild dependent atelectasis in the lower lobes. No suspicious pulmonary nodule or mass. Decreased AP diameter of the chest  due in part to marked levoconvex scoliosis of the thoracolumbar spine.  Bones/Soft Tissues: Marked levoconvex scoliosis of the thoracic spine. Dystrophic calcifications present about both shoulder joints. 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  CTA ABDOMEN AND PELVIS FINDINGS  VASCULAR  Aorta: Heterogeneous atherosclerotic calcification throughout the normal caliber aorta. No aneurysm, dissection or significant penetrating aortic ulcer.  Celiac: Widely patent. No splenic or hepatic arterial aneurysm. Conventional hepatic arterial anatomy.  SMA: Fibro fatty and calcified atherosclerotic plaque results in mild stenosis of the proximal SMA. Distally the vessels are unremarkable.  Renals: Single dominant right renal artery. Small accessory left renal artery to the lower pole. Mild atherosclerotic vascular calcifications of the arterial origins without significant stenosis.  IMA: Patent.  Inflow: Scattered atherosclerotic vascular calcification without significant stenosis, aneurysmal dilatation or dissection.  Proximal Outflow: Widely patent and relatively spared from disease  Veins: No focal venous abnormality.  Review of the MIP images confirms the above findings.  NON-VASCULAR  Abdomen: Unremarkable CTA appearance of the stomach, duodenum, spleen, adrenal glands and pancreas. 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 Normal hepatic morphology without discrete solid lesion. Stable  hepatic cysts. Stable punctate hyper enhancing focus at the hepatic dome. Gallbladder is unremarkable. No intra or extrahepatic biliary ductal dilatation.  No hydronephrosis, nephrolithiasis or evidence of pyelonephritis. Intermediate attenuation and possibly enhancing lesion exophytic from the posterior interpolar left kidney appears slightly enlarged at 15 x 13 mm compared to 14 x 12 mm previously.  High-grade small bowel obstruction.  Focal transition point in the the low mid abdomen just left of midline at the L4 level. The transition point occurs as the small bowel passes over the segment of colon heading toward the left lower quadrant colostomy. No evidence of ischemia of the affected loops of bowel. No significant surrounding inflammatory change, free fluid or free air.  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 The appendix is normal.  Pelvis: Unremarkable bladder, uterus and adnexa. Mild pelvic floor laxity.  Bones/Soft Tissues: Severe levoconvex scoliosis. 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 No definite acute osseous abnormality.  IMPRESSION: CTA CHEST  1. No acute vascular abnormality or aneurysmal dilatation. 2. Atherosclerotic vascular calcifications including coronary artery disease. 3. Cardiomegaly with left ventricular dilatation. 4. Enlarging low-attenuation nodule in the right thyroid gland compared to prior imaging. Recommend further evaluation with dedicated thyroid ultrasound as a biopsy may be warranted. 5. 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 6. Degenerative arthritis and dystrophic calcifications about both glenohumeral joints. CTA ABD/PELVIS  1. No acute vascular abnormality or aneurysmal dilatation. 2. High-grade small bowel obstruction with focal transition point just left of midline in the mid to lower abdomen. The transition point occurs as the small bowel passes over the segment of colon passing anteriorly to the left lower quadrant abdominal wall colostomy. Differential considerations include postsurgical adhesions and internal hernia. No evidence of ischemia or other complicating feature involving the loops of bowel involved in the obstruction. No free fluid or free air. 3. Similar to perhaps slightly enlarged intermediate attenuation and possibly enhancing lesion exophytic from the posterior interpolar left kidney.  Renal neoplasm can not be excluded and is suspected. Recommend further evaluation with abdominal MRI with contrast. 4. Severe levoconvex scoliosis. 5. Additional ancillary findings as above without significant interval change compared to prior imaging. These results were called by telephone at the time of interpretation on 05/01/2014 at 2:15 pm to Dr. Wyona Almas, who verbally acknowledged these results.  Signed,  Criselda Peaches, MD  [facility]  [facility]   Electronically Signed   By: Jacqulynn Cadet M.D.   On: 05/01/2014 14:18   Ct Cta Abd/pel W/cm &/or W/o Cm  05/01/2014   CLINICAL DATA:  58 year old female with left upper abdominal pain. Past history of colon surgery, colostomy, hernia repair and diverticulitis.  EXAM: CT ANGIOGRAPHY CHEST, ABDOMEN AND PELVIS  TECHNIQUE: Multidetector CT imaging through the chest, abdomen and pelvis was performed using the standard protocol during bolus administration of intravenous contrast. Multiplanar reconstructed images and MIPs were obtained and reviewed to evaluate the vascular anatomy.  CONTRAST:  125mL OMNIPAQUE IOHEXOL 350 MG/ML SOLN  COMPARISON:  Prior CTA abdomen and pelvis 02/07/2014 ; prior CT scan of the chest 05/23/2013  FINDINGS: CTA CHEST FINDINGS  VASCULAR  Heart/Vascular: No evidence of intramural hematoma on the non contrasted images. Normal caliber aorta without evidence of aneurysmal dilatation or dissection. The aorta is tortuous. Scattered mild atherosclerotic vascular calcifications. The main pulmonary artery is borderline enlarged. Cardiomegaly with left ventricular dilatation. No pericardial effusion. Atherosclerotic vascular calcifications present within the  left anterior descending coronary artery.  Review of the MIP images confirms the above findings.  NON VASCULAR  Mediastinum: 1.9 cm nonspecific cystic nodule within the right thyroid gland demonstrates slight interval enlargement compared  to 1.6 cm in November of 2014. Additional smaller thyroid nodules bilaterally. No suspicious adenopathy. Oblong soft tissue density in the anterior mediastinum demonstrates no significant interval change compared to the prior imaging. Today at the level of the pulmonary trunk, the abnormality measures 2.7 x 1.2 cm compared to 2.8 x 1.7 cm previously. More superiorly at the level of the aortic arch, the abnormality measures 1.4 x 2.6 cm which is very similar compared to prior.  Lungs/Pleura: Mild dependent atelectasis in the lower lobes. No suspicious pulmonary nodule or mass. Decreased AP diameter of the chest due in part to marked levoconvex scoliosis of the thoracolumbar spine.  Bones/Soft Tissues: Marked levoconvex scoliosis of the thoracic spine. Dystrophic calcifications present about both shoulder joints. 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  CTA ABDOMEN AND PELVIS FINDINGS  VASCULAR  Aorta: Heterogeneous atherosclerotic calcification throughout the normal caliber aorta. No aneurysm, dissection or significant penetrating aortic ulcer.  Celiac: Widely patent. No splenic or hepatic arterial aneurysm. Conventional hepatic arterial anatomy.  SMA: Fibro fatty and calcified atherosclerotic plaque results in mild stenosis of the proximal SMA. Distally the vessels are unremarkable.  Renals: Single dominant right renal artery. Small accessory left renal artery to the lower pole. Mild atherosclerotic vascular calcifications of the arterial origins without significant stenosis.  IMA: Patent.  Inflow: Scattered atherosclerotic vascular calcification without significant stenosis, aneurysmal dilatation or dissection.  Proximal Outflow: Widely patent and relatively spared from disease  Veins: No focal venous abnormality.  Review of the MIP images confirms the above findings.  NON-VASCULAR  Abdomen: Unremarkable CTA appearance of the stomach, duodenum, spleen, adrenal glands and pancreas.  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 Normal hepatic morphology without discrete solid lesion. Stable hepatic cysts. Stable punctate hyper enhancing focus at the hepatic dome. Gallbladder is unremarkable. No intra or extrahepatic biliary ductal dilatation.  No hydronephrosis, nephrolithiasis or evidence of pyelonephritis. Intermediate attenuation and possibly enhancing lesion exophytic from the posterior interpolar left kidney appears slightly enlarged at 15 x 13 mm compared to 14 x 12 mm previously.  High-grade small bowel obstruction. Focal transition point in the the low mid abdomen just left of midline at the L4 level. The transition point occurs as the small bowel passes over the segment of colon heading toward the left lower quadrant colostomy. No evidence of ischemia of the affected loops of bowel. No significant surrounding inflammatory change, free fluid or free air.  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 The appendix is normal.  Pelvis: Unremarkable bladder, uterus and adnexa. Mild pelvic floor laxity.  Bones/Soft Tissues: Severe levoconvex scoliosis. 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 No definite acute osseous abnormality.  IMPRESSION: CTA CHEST  1. No acute vascular abnormality or aneurysmal dilatation. 2. Atherosclerotic vascular calcifications including coronary artery disease. 3. Cardiomegaly with left ventricular dilatation. 4. Enlarging low-attenuation nodule in the right thyroid gland compared to prior imaging. Recommend further evaluation with dedicated thyroid ultrasound as a biopsy may be warranted. 5. 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 6. Degenerative arthritis and dystrophic calcifications about both glenohumeral joints. CTA ABD/PELVIS  1. No acute vascular abnormality or aneurysmal dilatation. 2. High-grade  small bowel obstruction  with focal transition point just left of midline in the mid to lower abdomen. The transition point occurs as the small bowel passes over the segment of colon passing anteriorly to the left lower quadrant abdominal wall colostomy. Differential considerations include postsurgical adhesions and internal hernia. No evidence of ischemia or other complicating feature involving the loops of bowel involved in the obstruction. No free fluid or free air. 3. Similar to perhaps slightly enlarged intermediate attenuation and possibly enhancing lesion exophytic from the posterior interpolar left kidney. Renal neoplasm can not be excluded and is suspected. Recommend further evaluation with abdominal MRI with contrast. 4. Severe levoconvex scoliosis. 5. Additional ancillary findings as above without significant interval change compared to prior imaging. These results were called by telephone at the time of interpretation on 05/01/2014 at 2:15 pm to Dr. Wyona Almas, who verbally acknowledged these results.  Signed,  Criselda Peaches, MD  [facility]  [facility]   Electronically Signed   By: Jacqulynn Cadet M.D.   On: 05/01/2014 14:18    Scheduled Meds: . enoxaparin (LOVENOX) injection  40 mg Subcutaneous Q24H   Continuous Infusions: . sodium chloride 100 mL/hr at 05/02/14 0327   Antibiotics Given (last 72 hours)    None      Active Problems:   THYROID NODULE   Blindness   Essential hypertension   Marfan's syndrome   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 11/28/2012   Tachycardia   Small bowel obstruction    Time spent: 2min    [facility] Pager [phone]. If 7PM-7AM, please contact night-coverage at [email or web address], password [facility] 05/02/2014, 10:18 AM  LOS: 1 day

## 2014-05-03 DIAGNOSIS — N2889 Other specified disorders of kidney and ureter: Secondary | ICD-10-CM

## 2014-05-03 LAB — COMPREHENSIVE METABOLIC PANEL
ALT: 16 U/L (ref 0–35)
AST: 21 U/L (ref 0–37)
Albumin: 3.1 g/dL — ABNORMAL LOW (ref 3.5–5.2)
Alkaline Phosphatase: 97 U/L (ref 39–117)
Anion gap: 14 (ref 5–15)
BUN: 14 mg/dL (ref 6–23)
CO2: 21 mEq/L (ref 19–32)
Calcium: 8.5 mg/dL (ref 8.4–10.5)
Chloride: 107 mEq/L (ref 96–112)
Creatinine, Ser: 0.67 mg/dL (ref 0.50–1.10)
GFR calc Af Amer: >90 mL/min (ref >90)
GFR calc non Af Amer: >90 mL/min (ref >90)
Glucose, Bld: 94 mg/dL (ref 70–99)
Potassium: 4 mEq/L (ref 3.7–5.3)
Sodium: 142 mEq/L (ref 137–147)
Total Bilirubin: 0.8 mg/dL (ref 0.3–1.2)
Total Protein: 6.2 g/dL (ref 6.0–8.3)

## 2014-05-03 LAB — CBC
HCT: 35 % — ABNORMAL LOW (ref 36.0–46.0)
Hemoglobin: 11.1 g/dL — ABNORMAL LOW (ref 12.0–15.0)
MCH: 30.2 pg (ref 26.0–34.0)
MCHC: 31.7 g/dL (ref 30.0–36.0)
MCV: 95.1 fL (ref 78.0–100.0)
Platelets: 272 10*3/uL (ref 150–400)
RBC: 3.68 MIL/uL — ABNORMAL LOW (ref 3.87–5.11)
RDW: 14.1 % (ref 11.5–15.5)
WBC: 4.8 10*3/uL (ref 4.0–10.5)

## 2014-05-03 LAB — MAGNESIUM: Magnesium: 1.7 mg/dL (ref 1.5–2.5)

## 2014-05-03 MED ORDER — GI COCKTAIL ~~LOC~~
30.0000 mL | Freq: Once | ORAL | Status: AC
  Administered 2014-05-03: 30 mL via ORAL
  Filled 2014-05-03: qty 30

## 2014-05-03 MED ORDER — HYDROCODONE-ACETAMINOPHEN 10-325 MG PO TABS: 1.0000 | ORAL_TABLET | Freq: Four times a day (QID) | ORAL | Status: DC | PRN

## 2014-05-03 MED ORDER — PROMETHAZINE HCL 25 MG/ML IJ SOLN
12.5000 mg | Freq: Once | INTRAMUSCULAR | Status: AC
  Administered 2014-05-03: 12.5 mg via INTRAVENOUS
  Filled 2014-05-03: qty 1

## 2014-05-03 MED ORDER — METOPROLOL TARTRATE 1 MG/ML IV SOLN
5.0000 mg | Freq: Four times a day (QID) | INTRAVENOUS | Status: DC
  Administered 2014-05-03 – 2014-05-07 (×11): 5 mg via INTRAVENOUS
  Filled 2014-05-03 (×13): qty 5

## 2014-05-03 NOTE — Telephone Encounter (Signed)
Appointment was not canceled 24 hours prior to appt, fee cannot be waived. Please advise patient

## 2014-05-03 NOTE — Progress Notes (Signed)
NGT clamped for 6 hours with no N/V.  NGT removed, will continue to monitor.

## 2014-05-03 NOTE — Telephone Encounter (Signed)
done

## 2014-05-03 NOTE — Care Management Note (Addendum)
    Page 1 of 2   05/10/2014     1:48:53 PM CARE MANAGEMENT NOTE 05/10/2014  Patient:  KILEA, MCCAREY   Account Number:  [id number]  Date Initiated:  05/03/2014  Documentation initiated by:  GRAVES-BIGELOW,Shiraz Bastyr  Subjective/Objective Assessment:   Pt admitted for Chest pain. Pt is legally blind. Has a CNA visit 2 hrs per day.     Action/Plan:   CM will continue to monitor for disposition needs.   Anticipated DC Date:  05/05/2014   Anticipated DC Plan:  [place]  CM consult      [facility] Choice  HOME HEALTH   Choice offered to / List presented to:  C-1 Patient        [place] arranged  HH-1 RN  [facility]      [place].   Status of service:  In process, will continue to follow Medicare Important Message given?  YES (If response is "NO", the following Medicare IM given date fields will be blank) Date Medicare IM given:  05/03/2014 Medicare IM given by:  GRAVES-BIGELOW,Varnika Butz Date Additional Medicare IM given:  05/06/2014 Additional Medicare IM given by:  [place]  Discharge Disposition:  [place]  Per UR Regulation:  Reviewed for med. necessity/level of care/duration of stay  If discussed at [place] of Stay Meetings, dates discussed:   05/06/2014  05/11/2014    Comments:   05-10-14 Medicare Important Message given? YES (If response is "NO", the following Medicare IM given date fields will be blank) Date Medicare IM given: Medicare IM given by: Jacqlyn Krauss Pt advanced to regular diet- Pt with bloating. Plan for home with [facility] services once stable for d/c.  05-06-14 1430 Jacqlyn Krauss, RN,BSN [phone] CM spoke to pt in ref to disposition needs. Pt is now agreeable for [facility] services. Pt has an aid that comes out 2 hrs per day. Pt states SNF not an option and she would rather be at home. CM did make referral to [facility] for [facility]  services. MD please write orders for [facility] services listed above. CM will continue to monitor.  05-05-14 1625  Jacqlyn Krauss, RN,BSN [phone] CM did speak to pt in ref to [facility] services and pt will be agreeable to [facility] services once stable for d/c. CM will continue to monitor.

## 2014-05-03 NOTE — Telephone Encounter (Signed)
Call in #120 with 5 rf 

## 2014-05-03 NOTE — Progress Notes (Signed)
[facility] PROGRESS NOTE  [name] WKM:[id number] DOB: 1955-11-10 DOA: 05/01/2014 PCP: [name], MD  Assessment/Plan: Small bowel obstruction -high-grade small bowel obstruction, due to adhesions -Surgery following, improving with NG decompression, BM this morning, surgery plans clamping NG and possible removal based on symptoms -per [facility]  Essential hypertension Patient takes amlodipine and metoprolol at home, as she is NPO -now on IV metoprolol 5 mg every 6 hours  Afib with RVR -since 11/8pm, now on Cardizem gtt. -IV metoprolol 5mg  Q6  History of latent TB Patient has been taking isoniazid since March 2015, Dr.Lama called and discussed with Dr. Megan Salon from infectious disease, patient does not need isolation. Will hold isoniazid at this time, as patient is nothing by mouth for SBO. Restart isoniazid when patient able to take PO  Questionable lesion in the left kidney CT abdomen reveals exophytic lesion from the posterior interpolar left kidney, she will need MRI of the abdomen once more stable.  Thyroid nodule -needs workup as outpatient  Rheumatoid arthritis Patient takes methotrexate injection once a week, will continue with Tylenol PRN rectally, as well as IV morphine PRN  GERD IV Protonix for now.  DVT prophylaxis Lovenox  Code status:Patient is full code Family discussion: none at bedside Dispo: when stable  Consultants:  [facility]  HPI/Subjective: BM this morning, afib with RVR overnight  Objective: Filed Vitals:   05/03/14 0830  BP: 120/57  Pulse:   Temp:   Resp: 15    Intake/Output Summary (Last 24 hours) at 05/03/14 1400 Last data filed at 05/03/14 0645  Gross per 24 hour  Intake   2206 ml  Output    900 ml  Net   1306 ml   Filed Weights   05/01/14 1058 05/01/14 1619  Weight: 63.504 kg (140 lb) 65.454 kg (144 lb 4.8 oz)    Exam:   General: AAOx3, chronically ill appearing  Skeletal deformities noted  HEENT:  blind  Cardiovascular: S1S2/RRR  Respiratory: CTAB  Abdomen: soft, mildly distended, colostomy, BS heard  Musculoskeletal: no edema c/c, deformities noted   Data Reviewed: Basic Metabolic Panel:  Recent Labs Lab 05/01/14 1138 05/01/14 1820 05/02/14 0357 05/03/14 0030  NA 142  --  142 142  K 3.8  --  4.5 4.0  CL 102  --  106 107  CO2 24  --  23 21  GLUCOSE 98  --  103* 94  BUN 15  --  16 14  CREATININE 0.77 0.79 0.79 0.67  CALCIUM 9.8  --  9.0 8.5  MG  --   --   --  1.7   Liver Function Tests:  Recent Labs Lab 05/02/14 0357 05/03/14 0030  AST 27 21  ALT 17 16  ALKPHOS 96 97  BILITOT 0.6 0.8  PROT 6.3 6.2  ALBUMIN 3.2* 3.1*   No results for input(s): LIPASE, AMYLASE in the last 168 hours. No results for input(s): AMMONIA in the last 168 hours. CBC:  Recent Labs Lab 05/01/14 1138 05/01/14 1820 05/02/14 0357 05/03/14 0030  WBC 8.3 6.9 5.9 4.8  NEUTROABS 6.2  --   --   --   HGB 11.6* 11.5* 11.3* 11.1*  HCT 35.9* 35.8* 35.8* 35.0*  MCV 92.1 94.0 93.5 95.1  PLT 296 273 294 272   Cardiac Enzymes:  Recent Labs Lab 05/01/14 1138 05/01/14 1820 05/01/14 2250 05/02/14 0357  TROPONINI <0.30 <0.30 <0.30 <0.30   BNP (last 3 results)  Recent Labs  12/31/13 1116 01/04/14 0245  PROBNP  6068.0* 3355.0*   CBG: No results for input(s): GLUCAP in the last 168 hours.  No results found for this or any previous visit (from the past 240 hour(s)).   Studies: Dg Abd 1 View  05/02/2014   CLINICAL DATA:  NG tube placement under fluoroscopy, small bowel obstruction  EXAM: ABDOMEN - 1 VIEW  COMPARISON:  None.  FINDINGS: A single postprocedural image demonstrates nasogastric tube tip terminating over the gastric cardia.  IMPRESSION: Post fluoroscopic placement of nasogastric tube with tip over the gastric cardia. Tube is ready for use.   Electronically Signed   By: Conchita Paris M.D.   On: 05/02/2014 14:29   Dg Abd 2 Views  05/02/2014   CLINICAL DATA:  Abdominal  pain, small bowel obstruction  EXAM: ABDOMEN - 2 VIEW  COMPARISON:  CT 05/01/2014  FINDINGS: No free air. Slight decrease in caliber of dilated small bowel loops, now measuring 3.7 cm maximally over the mid low abdomen. Retained contrast within the bladder. Severe leftward scoliosis and multilevel degenerative change noted with severe right hip degenerative change.  IMPRESSION: Improving gaseous distention of loops of small bowel compatible with small bowel obstruction.   Electronically Signed   By: Conchita Paris M.D.   On: 05/02/2014 08:22   Dg Loyce Dys Tube Plc W/fl-no Rad  05/02/2014   CLINICAL DATA:    NASO G TUBE PLACEMENT WITH FLUORO  Fluoroscopy was utilized by the requesting physician.  No radiographic  interpretation.     Scheduled Meds: . enoxaparin (LOVENOX) injection  40 mg Subcutaneous Q24H  . metoprolol  5 mg Intravenous 4 times per day   Continuous Infusions: . sodium chloride 1,000 mL (05/03/14 1007)  . diltiazem (CARDIZEM) infusion 15 mg/hr (05/03/14 1302)   Antibiotics Given (last 72 hours)    None      Active Problems:   THYROID NODULE   Blindness   Essential hypertension   Marfan's syndrome   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 11/28/2012   Tachycardia   Small bowel obstruction   SBO (small bowel obstruction)    Time spent: 29min    [facility] Pager [phone]. If 7PM-7AM, please contact night-coverage at [email or web address], password [facility] 05/03/2014, 2:00 PM  LOS: 2 days

## 2014-05-03 NOTE — Telephone Encounter (Signed)
Left message that dnka fee cannot be waived.

## 2014-05-03 NOTE — Progress Notes (Signed)
Pt was able to handle clear liquid diet for dinner.  Will continue to monitor.

## 2014-05-03 NOTE — Telephone Encounter (Signed)
Script is ready for pick up and I left a voice message for pt. 

## 2014-05-03 NOTE — Progress Notes (Signed)
Patient ID: [name], female   DOB: 08/02/1955, 58 y.o.   MRN: [id number]    Subjective: Pt feels better this am.  Started passing some hard stool and flatus.  No abdominal pain  Objective: Vital signs in last 24 hours: Temp:  [98 F (36.7 C)-98.6 F (37 C)] 98 F (36.7 C) (11/09 0623) Pulse Rate:  [50-125] 123 (11/09 0623) Resp:  [13-22] 15 (11/09 0830) BP: (109-130)/(57-104) 120/57 mmHg (11/09 0830) SpO2:  [94 %-96 %] 94 % (11/09 0623) Last BM Date: 05/02/14  Intake/Output from previous day: 11/08 0701 - 11/09 0700 In: 2506 [I.V.:2476; NG/GT:30] Out: 900 [Urine:200; Emesis/NG output:600; Stool:100] Intake/Output this shift:    PE: Abd: soft, Nt, Nd, +BS, ostomy with hard stool balls and air  Lab Results:   Recent Labs  05/02/14 0357 05/03/14 0030  WBC 5.9 4.8  HGB 11.3* 11.1*  HCT 35.8* 35.0*  PLT 294 272   BMET  Recent Labs  05/02/14 0357 05/03/14 0030  NA 142 142  K 4.5 4.0  CL 106 107  CO2 23 21  GLUCOSE 103* 94  BUN 16 14  CREATININE 0.79 0.67  CALCIUM 9.0 8.5   PT/INR No results for input(s): LABPROT, INR in the last 72 hours. CMP     Component Value Date/Time   NA 142 05/03/2014 0030   K 4.0 05/03/2014 0030   CL 107 05/03/2014 0030   CO2 21 05/03/2014 0030   GLUCOSE 94 05/03/2014 0030   BUN 14 05/03/2014 0030   CREATININE 0.67 05/03/2014 0030   CALCIUM 8.5 05/03/2014 0030   PROT 6.2 05/03/2014 0030   ALBUMIN 3.1* 05/03/2014 0030   AST 21 05/03/2014 0030   ALT 16 05/03/2014 0030   ALKPHOS 97 05/03/2014 0030   BILITOT 0.8 05/03/2014 0030   GFRNONAA >90 05/03/2014 0030   GFRAA >90 05/03/2014 0030   Lipase     Component Value Date/Time   LIPASE 71* 02/25/2013 2121       Studies/Results: Dg Abd 1 View  05/02/2014   CLINICAL DATA:  NG tube placement under fluoroscopy, small bowel obstruction  EXAM: ABDOMEN - 1 VIEW  COMPARISON:  None.  FINDINGS: A single postprocedural image demonstrates nasogastric tube tip terminating over  the gastric cardia.  IMPRESSION: Post fluoroscopic placement of nasogastric tube with tip over the gastric cardia. Tube is ready for use.   Electronically Signed   By: Conchita Paris M.D.   On: 05/02/2014 14:29   Dg Abd 2 Views  05/02/2014   CLINICAL DATA:  Abdominal pain, small bowel obstruction  EXAM: ABDOMEN - 2 VIEW  COMPARISON:  CT 05/01/2014  FINDINGS: No free air. Slight decrease in caliber of dilated small bowel loops, now measuring 3.7 cm maximally over the mid low abdomen. Retained contrast within the bladder. Severe leftward scoliosis and multilevel degenerative change noted with severe right hip degenerative change.  IMPRESSION: Improving gaseous distention of loops of small bowel compatible with small bowel obstruction.   Electronically Signed   By: Conchita Paris M.D.   On: 05/02/2014 08:22   Dg Loyce Dys Tube Plc W/fl-no Rad  05/02/2014   CLINICAL DATA:    NASO G TUBE PLACEMENT WITH FLUORO  Fluoroscopy was utilized by the requesting physician.  No radiographic  interpretation.    Ct Angio Chest Aorta W/cm &/or Wo/cm  05/01/2014   CLINICAL DATA:  58 year old female with left upper abdominal pain. Past history of colon surgery, colostomy, hernia repair and diverticulitis.  EXAM: CT  ANGIOGRAPHY CHEST, ABDOMEN AND PELVIS  TECHNIQUE: Multidetector CT imaging through the chest, abdomen and pelvis was performed using the standard protocol during bolus administration of intravenous contrast. Multiplanar reconstructed images and MIPs were obtained and reviewed to evaluate the vascular anatomy.  CONTRAST:  163mL OMNIPAQUE IOHEXOL 350 MG/ML SOLN  COMPARISON:  Prior CTA abdomen and pelvis 02/07/2014 ; prior CT scan of the chest 05/23/2013  FINDINGS: CTA CHEST FINDINGS  VASCULAR  Heart/Vascular: No evidence of intramural hematoma on the non contrasted images. Normal caliber aorta without evidence of aneurysmal dilatation or dissection. The aorta is tortuous. Scattered mild atherosclerotic vascular  calcifications. The main pulmonary artery is borderline enlarged. Cardiomegaly with left ventricular dilatation. No pericardial effusion. Atherosclerotic vascular calcifications present within the left anterior descending coronary artery.  Review of the MIP images confirms the above findings.  NON VASCULAR  Mediastinum: 1.9 cm nonspecific cystic nodule within the right thyroid gland demonstrates slight interval enlargement compared to 1.6 cm in November of 2014. Additional smaller thyroid nodules bilaterally. No suspicious adenopathy. Oblong soft tissue density in the anterior mediastinum demonstrates no significant interval change compared to the prior imaging. Today at the level of the pulmonary trunk, the abnormality measures 2.7 x 1.2 cm compared to 2.8 x 1.7 cm previously. More superiorly at the level of the aortic arch, the abnormality measures 1.4 x 2.6 cm which is very similar compared to prior.  Lungs/Pleura: Mild dependent atelectasis in the lower lobes. No suspicious pulmonary nodule or mass. Decreased AP diameter of the chest due in part to marked levoconvex scoliosis of the thoracolumbar spine.  Bones/Soft Tissues: Marked levoconvex scoliosis of the thoracic spine. Dystrophic calcifications present about both shoulder joints. 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  CTA ABDOMEN AND PELVIS FINDINGS  VASCULAR  Aorta: Heterogeneous atherosclerotic calcification throughout the normal caliber aorta. No aneurysm, dissection or significant penetrating aortic ulcer.  Celiac: Widely patent. No splenic or hepatic arterial aneurysm. Conventional hepatic arterial anatomy.  SMA: Fibro fatty and calcified atherosclerotic plaque results in mild stenosis of the proximal SMA. Distally the vessels are unremarkable.  Renals: Single dominant right renal artery. Small accessory left renal artery to the lower pole. Mild atherosclerotic vascular calcifications of the arterial origins without  significant stenosis.  IMA: Patent.  Inflow: Scattered atherosclerotic vascular calcification without significant stenosis, aneurysmal dilatation or dissection.  Proximal Outflow: Widely patent and relatively spared from disease  Veins: No focal venous abnormality.  Review of the MIP images confirms the above findings.  NON-VASCULAR  Abdomen: Unremarkable CTA appearance of the stomach, duodenum, spleen, adrenal glands and pancreas. 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 Normal hepatic morphology without discrete solid lesion. Stable hepatic cysts. Stable punctate hyper enhancing focus at the hepatic dome. Gallbladder is unremarkable. No intra or extrahepatic biliary ductal dilatation.  No hydronephrosis, nephrolithiasis or evidence of pyelonephritis. Intermediate attenuation and possibly enhancing lesion exophytic from the posterior interpolar left kidney appears slightly enlarged at 15 x 13 mm compared to 14 x 12 mm previously.  High-grade small bowel obstruction. Focal transition point in the the low mid abdomen just left of midline at the L4 level. The transition point occurs as the small bowel passes over the segment of colon heading toward the left lower quadrant colostomy. No evidence of ischemia of the affected loops of bowel. No significant surrounding inflammatory change, free fluid or free air.  Hartman's pouch with a left lower quadrant diverting  colostomy. The appendix is normal.  Pelvis: Unremarkable bladder, uterus and adnexa. Mild pelvic floor laxity.  Bones/Soft Tissues: Severe levoconvex scoliosis. 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 No definite acute osseous abnormality.  IMPRESSION: CTA CHEST  1. No acute vascular abnormality or aneurysmal dilatation. 2. Atherosclerotic vascular calcifications including coronary artery disease. 3. Cardiomegaly with left ventricular dilatation. 4. Enlarging  low-attenuation nodule in the right thyroid gland compared to prior imaging. Recommend further evaluation with dedicated thyroid ultrasound as a biopsy may be warranted. 5. 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 6. Degenerative arthritis and dystrophic calcifications about both glenohumeral joints. CTA ABD/PELVIS  1. No acute vascular abnormality or aneurysmal dilatation. 2. High-grade small bowel obstruction with focal transition point just left of midline in the mid to lower abdomen. The transition point occurs as the small bowel passes over the segment of colon passing anteriorly to the left lower quadrant abdominal wall colostomy. Differential considerations include postsurgical adhesions and internal hernia. No evidence of ischemia or other complicating feature involving the loops of bowel involved in the obstruction. No free fluid or free air. 3. Similar to perhaps slightly enlarged intermediate attenuation and possibly enhancing lesion exophytic from the posterior interpolar left kidney. Renal neoplasm can not be excluded and is suspected. Recommend further evaluation with abdominal MRI with contrast. 4. Severe levoconvex scoliosis. 5. Additional ancillary findings as above without significant interval change compared to prior imaging. These results were called by telephone at the time of interpretation on 05/01/2014 at 2:15 pm to Dr. Wyona Almas, who verbally acknowledged these results.  Signed,  Criselda Peaches, MD  [facility]  [facility]   Electronically Signed   By: Jacqulynn Cadet M.D.   On: 05/01/2014 14:18   Ct Cta Abd/pel W/cm &/or W/o Cm  05/01/2014   CLINICAL DATA:  58 year old female with left upper abdominal pain. Past history of colon surgery, colostomy, hernia repair and diverticulitis.  EXAM: CT ANGIOGRAPHY CHEST, ABDOMEN AND PELVIS  TECHNIQUE: Multidetector CT imaging through the chest,  abdomen and pelvis was performed using the standard protocol during bolus administration of intravenous contrast. Multiplanar reconstructed images and MIPs were obtained and reviewed to evaluate the vascular anatomy.  CONTRAST:  147mL OMNIPAQUE IOHEXOL 350 MG/ML SOLN  COMPARISON:  Prior CTA abdomen and pelvis 02/07/2014 ; prior CT scan of the chest 05/23/2013  FINDINGS: CTA CHEST FINDINGS  VASCULAR  Heart/Vascular: No evidence of intramural hematoma on the non contrasted images. Normal caliber aorta without evidence of aneurysmal dilatation or dissection. The aorta is tortuous. Scattered mild atherosclerotic vascular calcifications. The main pulmonary artery is borderline enlarged. Cardiomegaly with left ventricular dilatation. No pericardial effusion. Atherosclerotic vascular calcifications present within the left anterior descending coronary artery.  Review of the MIP images confirms the above findings.  NON VASCULAR  Mediastinum: 1.9 cm nonspecific cystic nodule within the right thyroid gland demonstrates slight interval enlargement compared to 1.6 cm in November of 2014. Additional smaller thyroid nodules bilaterally. No suspicious adenopathy. Oblong soft tissue density in the anterior mediastinum demonstrates no significant interval change compared to the prior imaging. Today at the level of the pulmonary trunk, the abnormality measures 2.7 x 1.2 cm compared to 2.8 x 1.7 cm previously. More superiorly at the level of the aortic arch, the abnormality measures 1.4 x 2.6 cm which is very similar compared to prior.  Lungs/Pleura: Mild dependent atelectasis in the lower lobes. No suspicious pulmonary nodule or mass. Decreased  AP diameter of the chest due in part to marked levoconvex scoliosis of the thoracolumbar spine.  Bones/Soft Tissues: Marked levoconvex scoliosis of the thoracic spine. Dystrophic calcifications present about both shoulder joints. Advanced degenerative osteoarthritis in the right glenohumeral  joint.  CTA ABDOMEN AND PELVIS FINDINGS  VASCULAR  Aorta: Heterogeneous atherosclerotic calcification throughout the normal caliber aorta. No aneurysm, dissection or significant penetrating aortic ulcer.  Celiac: Widely patent. No splenic or hepatic arterial aneurysm. Conventional hepatic arterial anatomy.  SMA: Fibro fatty and calcified atherosclerotic plaque results in mild stenosis of the proximal SMA. Distally the vessels are unremarkable.  Renals: Single dominant right renal artery. Small accessory left renal artery to the lower pole. Mild atherosclerotic vascular calcifications of the arterial origins without significant stenosis.  IMA: Patent.  Inflow: Scattered atherosclerotic vascular calcification without significant stenosis, aneurysmal dilatation or dissection.  Proximal Outflow: Widely patent and relatively spared from disease  Veins: No focal venous abnormality.  Review of the MIP images confirms the above findings.  NON-VASCULAR  Abdomen: Unremarkable CTA appearance of the stomach, duodenum, spleen, adrenal glands and pancreas. 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 Normal hepatic morphology without discrete solid lesion. Stable hepatic cysts. Stable punctate hyper enhancing focus at the hepatic dome. Gallbladder is unremarkable. No intra or extrahepatic biliary ductal dilatation.  No hydronephrosis, nephrolithiasis or evidence of pyelonephritis. Intermediate attenuation and possibly enhancing lesion exophytic from the posterior interpolar left kidney appears slightly enlarged at 15 x 13 mm compared to 14 x 12 mm previously.  High-grade small bowel obstruction. Focal transition point in the the low mid abdomen just left of midline at the L4 level. The transition point occurs as the small bowel passes over the segment of colon heading toward the left lower quadrant colostomy. No evidence of ischemia of the affected loops of  bowel. No significant surrounding inflammatory change, free fluid or free air.  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 The appendix is normal.  Pelvis: Unremarkable bladder, uterus and adnexa. Mild pelvic floor laxity.  Bones/Soft Tissues: Severe levoconvex scoliosis. 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 No definite acute osseous abnormality.  IMPRESSION: CTA CHEST  1. No acute vascular abnormality or aneurysmal dilatation. 2. Atherosclerotic vascular calcifications including coronary artery disease. 3. Cardiomegaly with left ventricular dilatation. 4. Enlarging low-attenuation nodule in the right thyroid gland compared to prior imaging. Recommend further evaluation with dedicated thyroid ultrasound as a biopsy may be warranted. 5. 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 6. Degenerative arthritis and dystrophic calcifications about both glenohumeral joints. CTA ABD/PELVIS  1. No acute vascular abnormality or aneurysmal dilatation. 2. High-grade small bowel obstruction with focal transition point just left of midline in the mid to lower abdomen. The transition point occurs as the small bowel passes over the segment of colon passing anteriorly to the left lower quadrant abdominal wall colostomy. Differential considerations include postsurgical adhesions and internal hernia. No evidence of ischemia or other complicating feature involving the loops of bowel involved in the obstruction. No free fluid or free air. 3. Similar to perhaps slightly enlarged intermediate attenuation and possibly enhancing lesion exophytic from the posterior interpolar left kidney. Renal neoplasm can not be excluded and is suspected. Recommend further evaluation with abdominal MRI with contrast. 4. Severe levoconvex scoliosis. 5. Additional ancillary findings as above without significant interval change compared to prior imaging. These results  were called by telephone  at the time of interpretation on 05/01/2014 at 2:15 pm to Dr. Wyona Almas, who verbally acknowledged these results.  Signed,  Criselda Peaches, MD  [facility]  [facility]   Electronically Signed   By: Jacqulynn Cadet M.D.   On: 05/01/2014 14:18    Anti-infectives: Anti-infectives    None       Assessment/Plan  1. SBO, improving  Plan: 1. Will clamp NGT for 6 hours, if no no N/V, then dc and give clear liquids.   LOS: 2 days    Ridley Dileo E 05/03/2014, 9:02 AM Pager: [phone]

## 2014-05-03 NOTE — Progress Notes (Signed)
Patient vomiting up large amount of brown colored liquid emesis. Will medicate as ordered PRN

## 2014-05-04 ENCOUNTER — Inpatient Hospital Stay (HOSPITAL_COMMUNITY): Payer: Medicare Other

## 2014-05-04 LAB — BASIC METABOLIC PANEL
Anion gap: 16 — ABNORMAL HIGH (ref 5–15)
BUN: 8 mg/dL (ref 6–23)
CO2: 23 mEq/L (ref 19–32)
Calcium: 9 mg/dL (ref 8.4–10.5)
Chloride: 103 mEq/L (ref 96–112)
Creatinine, Ser: 0.64 mg/dL (ref 0.50–1.10)
GFR calc Af Amer: >90 mL/min (ref >90)
GFR calc non Af Amer: >90 mL/min (ref >90)
Glucose, Bld: 107 mg/dL — ABNORMAL HIGH (ref 70–99)
Potassium: 3.8 mEq/L (ref 3.7–5.3)
Sodium: 142 mEq/L (ref 137–147)

## 2014-05-04 LAB — CBC
HCT: 35.3 % — ABNORMAL LOW (ref 36.0–46.0)
Hemoglobin: 11.3 g/dL — ABNORMAL LOW (ref 12.0–15.0)
MCH: 29.4 pg (ref 26.0–34.0)
MCHC: 32 g/dL (ref 30.0–36.0)
MCV: 91.9 fL (ref 78.0–100.0)
Platelets: 270 10*3/uL (ref 150–400)
RBC: 3.84 MIL/uL — ABNORMAL LOW (ref 3.87–5.11)
RDW: 14 % (ref 11.5–15.5)
WBC: 5.5 10*3/uL (ref 4.0–10.5)

## 2014-05-04 MED ORDER — PROMETHAZINE HCL 25 MG/ML IJ SOLN
12.5000 mg | Freq: Once | INTRAMUSCULAR | Status: AC
  Administered 2014-05-04: 12.5 mg via INTRAVENOUS
  Filled 2014-05-04: qty 1

## 2014-05-04 MED ORDER — ONDANSETRON HCL 4 MG/2ML IJ SOLN
INTRAMUSCULAR | Status: AC
  Administered 2014-05-04: 4 mg via INTRAVENOUS
  Filled 2014-05-04: qty 2

## 2014-05-04 NOTE — Progress Notes (Addendum)
[facility] PROGRESS NOTE  SHAUNESSY DOBRATZ VOJ:[id number] DOB: [birth year] DOA: 05/01/2014 PCP: [name], MD   Brief Narrative: 58 year old female with history of Legally blind, Marfan's syndrome, Hypertension; Substance abuse; CVA,  (2/08); Fibroid; h/o Small bowel obstruction due to adhesions; Rheumatoid arthritis; and Atrial flutter. Presented to the ED with chief complaint of chest pain/abd pain with nausea and vomiting. CT angiogram of the chest was done which ruled out aortic dissection, CTA of the abdomen pelvis revealed high-grade small bowel obstruction. Patient has colostomy bag in place, she haddiverticulitis of colon with perforation status post colectomy/ostomy on 11/28/2012, she also has history of rheumatoid arthritis, and is followed by rheumatologist. She was started on isoniazid for latent tuberculosis in March of this year. Since admission, seen by [facility], had NG placed for decompression, started improving and hence NG clamped and removed per [facility] recs 11/9, Overnight 11/9 with recurrent vomiting and now NG to be replaced. Also has been in Afib RVR on Cardizem gtt  Assessment/Plan: Small bowel obstruction -high-grade small bowel obstruction, due to adhesions -NG was removed per Surgery 11/9, but with recurrence of symptoms since last pm and hence NG being replaced today -may need Surgery given high grade nature if doesn't improve with conservative mgt -per [facility]  Essential hypertension Patient takes amlodipine and metoprolol at home, as she is NPO -now on IV metoprolol 5 mg every 6 hours  Afib with RVR -since 11/8pm, now on Cardizem gtt. -IV metoprolol 5mg  Q6, has h/o Aflutter per EMR, h/o ablation/followed by Dr.Taylor, not on anticoagulation chronically, would likely be a poor candidate for anticoagulation given blindness/fall risk etc  History of latent TB Patient has been taking isoniazid since March 2015, Dr.Lama called and discussed with Dr. Megan Salon from  infectious disease, patient does not need isolation. Will hold isoniazid at this time, as patient is nothing by mouth for SBO. Restart isoniazid when patient able to take PO  Questionable lesion in the left kidney CT abdomen reveals exophytic lesion from the posterior interpolar left kidney, she will need MRI of the abdomen once more stable.  Thyroid nodule -needs workup as outpatient  Rheumatoid arthritis Patient takes methotrexate injection once a week, will continue with Tylenol PRN rectally, as well as IV morphine PRN  GERD IV Protonix for now.  DVT prophylaxis Lovenox  Code status:Patient is full code Family discussion: none at bedside Dispo: when stable  Consultants:  [facility]  HPI/Subjective: 6-7 episodes of vomiting, abd discomfort, still having BMs  Objective: Filed Vitals:   05/04/14 0841  BP: 134/78  Pulse:   Temp:   Resp: 18    Intake/Output Summary (Last 24 hours) at 05/04/14 1045 Last data filed at 05/04/14 0741  Gross per 24 hour  Intake 2212.92 ml  Output   1485 ml  Net 727.92 ml   Filed Weights   05/01/14 1058 05/01/14 1619  Weight: 63.504 kg (140 lb) 65.454 kg (144 lb 4.8 oz)    Exam:   General: AAOx3, chronically ill appearing  HEENT: eyes closed, blind  Skeletal deformities noted  Cardiovascular: S1S2/RRR  Respiratory: CTAB  Abdomen: soft, mildly distended, colostomy with stool, BS heard  Musculoskeletal: no edema c/c, deformities noted   Data Reviewed: Basic Metabolic Panel:  Recent Labs Lab 05/01/14 1138 05/01/14 1820 05/02/14 0357 05/03/14 0030 05/04/14 0347  NA 142  --  142 142 142  K 3.8  --  4.5 4.0 3.8  CL 102  --  106 107 103  CO2 24  --  23 21 23   GLUCOSE 98  --  103* 94 107*  BUN 15  --  16 14 8   CREATININE 0.77 0.79 0.79 0.67 0.64  CALCIUM 9.8  --  9.0 8.5 9.0  MG  --   --   --  1.7  --    Liver Function Tests:  Recent Labs Lab 05/02/14 0357 05/03/14 0030  AST 27 21  ALT 17 16  ALKPHOS 96 97   BILITOT 0.6 0.8  PROT 6.3 6.2  ALBUMIN 3.2* 3.1*   No results for input(s): LIPASE, AMYLASE in the last 168 hours. No results for input(s): AMMONIA in the last 168 hours. CBC:  Recent Labs Lab 05/01/14 1138 05/01/14 1820 05/02/14 0357 05/03/14 0030 05/04/14 0347  WBC 8.3 6.9 5.9 4.8 5.5  NEUTROABS 6.2  --   --   --   --   HGB 11.6* 11.5* 11.3* 11.1* 11.3*  HCT 35.9* 35.8* 35.8* 35.0* 35.3*  MCV 92.1 94.0 93.5 95.1 91.9  PLT 296 273 294 272 270   Cardiac Enzymes:  Recent Labs Lab 05/01/14 1138 05/01/14 1820 05/01/14 2250 05/02/14 0357  TROPONINI <0.30 <0.30 <0.30 <0.30   BNP (last 3 results)  Recent Labs  12/31/13 1116 01/04/14 0245  PROBNP 6068.0* 3355.0*   CBG: No results for input(s): GLUCAP in the last 168 hours.  No results found for this or any previous visit (from the past 240 hour(s)).   Studies: Dg Abd 1 View  05/02/2014   CLINICAL DATA:  NG tube placement under fluoroscopy, small bowel obstruction  EXAM: ABDOMEN - 1 VIEW  COMPARISON:  None.  FINDINGS: A single postprocedural image demonstrates nasogastric tube tip terminating over the gastric cardia.  IMPRESSION: Post fluoroscopic placement of nasogastric tube with tip over the gastric cardia. Tube is ready for use.   Electronically Signed   By: Conchita Paris M.D.   On: 05/02/2014 14:29   Dg Abd Acute W/chest  05/04/2014   CLINICAL DATA:  Vomiting.  EXAM: ACUTE ABDOMEN SERIES (ABDOMEN 2 VIEW & CHEST 1 VIEW)  COMPARISON:  05/02/2014.  CT 05/01/2014 .  FINDINGS: Chest x-ray reveals no acute cardiopulmonary disease. Mild cardiomegaly. No CHF. Severe degenerative deformities both shoulders. Severe thoracic spine scoliosis. Soft tissue structures are unremarkable. Persistent distention of small bowel noted without interim change. Small bowel distention up to 3.7 cm noted. Colonic gas pattern is nonspecific. No free air. Aortoiliac atherosclerotic vascular disease. Calcifications within the pelvis most  consistent with phleboliths. Soft tissue calcifications over the lower extremities. Severe lumbar spine degenerative change concave right. Severe lumbar spine and bilateral hip degenerative change.  IMPRESSION: Persistent small bowel distention without interim change from prior exam.   Electronically Signed   By: [place]   On: 05/04/2014 08:43   Dg Loyce Dys Tube Plc W/fl-no Rad  05/02/2014   CLINICAL DATA:    NASO G TUBE PLACEMENT WITH FLUORO  Fluoroscopy was utilized by the requesting physician.  No radiographic  interpretation.     Scheduled Meds: . enoxaparin (LOVENOX) injection  40 mg Subcutaneous Q24H  . metoprolol  5 mg Intravenous 4 times per day   Continuous Infusions: . sodium chloride 1,000 mL (05/03/14 1007)  . diltiazem (CARDIZEM) infusion 15 mg/hr (05/04/14 1007)   Antibiotics Given (last 72 hours)    None      Active Problems:   THYROID NODULE   Blindness   Essential hypertension   Marfan's syndrome   Diverticulitis of colon with perforation s/p  colectomy/ostomy 11/28/2012   Tachycardia   Small bowel obstruction   SBO (small bowel obstruction)   Renal mass    Time spent: 11min    [facility] Pager [phone]. If 7PM-7AM, please contact night-coverage at [email or web address], password [facility] 05/04/2014, 10:45 AM  LOS: 3 days

## 2014-05-04 NOTE — Progress Notes (Signed)
Pt. Vomited 458ml brown colored emesis after IV phenergan and GI cocktail given.  Will continue to monitor patient.

## 2014-05-04 NOTE — Progress Notes (Signed)
Patient ID: [name], female   DOB: 1956-03-09, 58 y.o.   MRN: [id number]    Subjective: Pt feels better with NGT back in place.  Still passing flatus and stool into bag.    Objective: Vital signs in last 24 hours: Temp:  [98.2 F (36.8 C)-99 F (37.2 C)] 98.9 F (37.2 C) (11/10 0442) Pulse Rate:  [68] 68 (11/09 1417) Resp:  [16-20] 18 (11/10 1227) BP: (112-161)/(71-94) 141/73 mmHg (11/10 1227) SpO2:  [98 %-100 %] 98 % (11/10 1227) Last BM Date: 05/04/14  Intake/Output from previous day: 11/09 0701 - 11/10 0700 In: 2212.9 [P.O.:250; I.V.:1962.9] Out: 1235 [Urine:625; Emesis/NG output:600; Stool:10] Intake/Output this shift: Total I/O In: -  Out: 250 [Emesis/NG output:250]  PE: Abd: soft, minimally tender, ostomy with thick stool output and air  Lab Results:   Recent Labs  05/03/14 0030 05/04/14 0347  WBC 4.8 5.5  HGB 11.1* 11.3*  HCT 35.0* 35.3*  PLT 272 270   BMET  Recent Labs  05/03/14 0030 05/04/14 0347  NA 142 142  K 4.0 3.8  CL 107 103  CO2 21 23  GLUCOSE 94 107*  BUN 14 8  CREATININE 0.67 0.64  CALCIUM 8.5 9.0   PT/INR No results for input(s): LABPROT, INR in the last 72 hours. CMP     Component Value Date/Time   NA 142 05/04/2014 0347   K 3.8 05/04/2014 0347   CL 103 05/04/2014 0347   CO2 23 05/04/2014 0347   GLUCOSE 107* 05/04/2014 0347   BUN 8 05/04/2014 0347   CREATININE 0.64 05/04/2014 0347   CALCIUM 9.0 05/04/2014 0347   PROT 6.2 05/03/2014 0030   ALBUMIN 3.1* 05/03/2014 0030   AST 21 05/03/2014 0030   ALT 16 05/03/2014 0030   ALKPHOS 97 05/03/2014 0030   BILITOT 0.8 05/03/2014 0030   GFRNONAA >90 05/04/2014 0347   GFRAA >90 05/04/2014 0347   Lipase     Component Value Date/Time   LIPASE 71* 02/25/2013 2121       Studies/Results: Dg Abd 1 View  05/04/2014   CLINICAL DATA:  Nasogastric tube placement under fluoroscopy.  EXAM: ABDOMEN - 1 VIEW  COMPARISON:  None.  FINDINGS: Single portable image shows a  nasogastric tube with its tip in the mid to distal stomach.  IMPRESSION: NG tube tip projects in the mid to distal stomach.   Electronically Signed   By: Lajean Manes M.D.   On: 05/04/2014 12:09   Dg Abd 1 View  05/02/2014   CLINICAL DATA:  NG tube placement under fluoroscopy, small bowel obstruction  EXAM: ABDOMEN - 1 VIEW  COMPARISON:  None.  FINDINGS: A single postprocedural image demonstrates nasogastric tube tip terminating over the gastric cardia.  IMPRESSION: Post fluoroscopic placement of nasogastric tube with tip over the gastric cardia. Tube is ready for use.   Electronically Signed   By: Conchita Paris M.D.   On: 05/02/2014 14:29   Dg Abd Acute W/chest  05/04/2014   CLINICAL DATA:  Vomiting.  EXAM: ACUTE ABDOMEN SERIES (ABDOMEN 2 VIEW & CHEST 1 VIEW)  COMPARISON:  05/02/2014.  CT 05/01/2014 .  FINDINGS: Chest x-ray reveals no acute cardiopulmonary disease. Mild cardiomegaly. No CHF. Severe degenerative deformities both shoulders. Severe thoracic spine scoliosis. Soft tissue structures are unremarkable. Persistent distention of small bowel noted without interim change. Small bowel distention up to 3.7 cm noted. Colonic gas pattern is nonspecific. No free air. Aortoiliac atherosclerotic vascular disease. Calcifications within the pelvis most consistent  with phleboliths. Soft tissue calcifications over the lower extremities. Severe lumbar spine degenerative change concave right. Severe lumbar spine and bilateral hip degenerative change.  IMPRESSION: Persistent small bowel distention without interim change from prior exam.   Electronically Signed   By: [place]   On: 05/04/2014 08:43   Dg Loyce Dys Tube Plc W/fl-no Rad  05/02/2014   CLINICAL DATA:    NASO G TUBE PLACEMENT WITH FLUORO  Fluoroscopy was utilized by the requesting physician.  No radiographic  interpretation.     Anti-infectives: Anti-infectives    None       Assessment/Plan  1. Partial small bowel  obstruction  Plan: 1. Patient had N/V overnight.  NGT replaced today.  Still having lots of flatus and stool in bag.  Cont with NGT and recheck abdominal films in the morning.     LOS: 3 days    Anetta Olvera E 05/04/2014, 12:49 PM Pager: [phone]

## 2014-05-04 NOTE — Consult Note (Signed)
WOC consulted for assistance with ostomy pouch supplies. Provided Kellie Simmering numbers for bedside nursing staff for 2 pc ostomy pouch that patient is currently using.  Pt with stoma prior to this admission.   Discussed POC with patient and bedside nurse.  Re consult if needed, will not follow at this time. Thanks  Darrel Gloss [place], [place] [phone])

## 2014-05-04 NOTE — Progress Notes (Signed)
Pt. States "I have heartburn.  I usually take nexium every day."  Pt. With vomiting x1 brown emesis.  IV zofran given at 2000 for c/o nausea.  hospitalist notified.  Orders received for GI cocktail and IV phenergan.  Will continue to monitor patient.

## 2014-05-04 NOTE — Plan of Care (Signed)
Problem: Phase I Progression Outcomes Goal: Pain controlled with appropriate interventions Outcome: Completed/Met Date Met:  05/04/14 Goal: Initial discharge plan identified Outcome: Completed/Met Date Met:  05/04/14 Goal: Voiding-avoid urinary catheter unless indicated Outcome: Completed/Met Date Met:  05/04/14 Goal: Hemodynamically stable Outcome: Completed/Met Date Met:  05/04/14 Goal: Other Phase I Outcomes/Goals Outcome: Completed/Met Date Met:  05/04/14

## 2014-05-05 ENCOUNTER — Inpatient Hospital Stay (HOSPITAL_COMMUNITY): Payer: Medicare Other

## 2014-05-05 DIAGNOSIS — K5720 Diverticulitis of large intestine with perforation and abscess without bleeding: Secondary | ICD-10-CM

## 2014-05-05 DIAGNOSIS — E041 Nontoxic single thyroid nodule: Secondary | ICD-10-CM

## 2014-05-05 LAB — CBC
HCT: 30.1 % — ABNORMAL LOW (ref 36.0–46.0)
Hemoglobin: 9.8 g/dL — ABNORMAL LOW (ref 12.0–15.0)
MCH: 30 pg (ref 26.0–34.0)
MCHC: 32.6 g/dL (ref 30.0–36.0)
MCV: 92 fL (ref 78.0–100.0)
Platelets: 257 10*3/uL (ref 150–400)
RBC: 3.27 MIL/uL — ABNORMAL LOW (ref 3.87–5.11)
RDW: 14.1 % (ref 11.5–15.5)
WBC: 3.8 10*3/uL — ABNORMAL LOW (ref 4.0–10.5)

## 2014-05-05 LAB — BASIC METABOLIC PANEL
Anion gap: 15 (ref 5–15)
BUN: 13 mg/dL (ref 6–23)
CO2: 27 mEq/L (ref 19–32)
Calcium: 8.3 mg/dL — ABNORMAL LOW (ref 8.4–10.5)
Chloride: 104 mEq/L (ref 96–112)
Creatinine, Ser: 0.69 mg/dL (ref 0.50–1.10)
GFR calc Af Amer: >90 mL/min (ref >90)
GFR calc non Af Amer: >90 mL/min (ref >90)
Glucose, Bld: 88 mg/dL (ref 70–99)
Potassium: 3.3 mEq/L — ABNORMAL LOW (ref 3.7–5.3)
Sodium: 146 mEq/L (ref 137–147)

## 2014-05-05 LAB — TSH: TSH: 1.23 u[IU]/mL (ref 0.350–4.500)

## 2014-05-05 MED ORDER — PANTOPRAZOLE SODIUM 40 MG IV SOLR
40.0000 mg | Freq: Two times a day (BID) | INTRAVENOUS | Status: DC
  Administered 2014-05-05 – 2014-05-08 (×7): 40 mg via INTRAVENOUS
  Filled 2014-05-05 (×7): qty 40

## 2014-05-05 MED ORDER — POTASSIUM CHLORIDE 2 MEQ/ML IV SOLN
INTRAVENOUS | Status: DC
  Administered 2014-05-05 – 2014-05-10 (×9): via INTRAVENOUS
  Filled 2014-05-05 (×13): qty 1000

## 2014-05-05 NOTE — Progress Notes (Signed)
Patient ID: [name], female   DOB: Jun 29, 1955, 58 y.o.   MRN: [id number]    Subjective: Pt feels ok today.  Abdominal pain resolved.  Still putting out stool and flatus in ostomy bag.  Objective: Vital signs in last 24 hours: Temp:  [98.2 F (36.8 C)-99.9 F (37.7 C)] 98.4 F (36.9 C) (11/11 0405) Pulse Rate:  [83-104] 83 (11/11 0405) Resp:  [15-23] 16 (11/11 0811) BP: (101-141)/(59-86) 125/71 mmHg (11/11 0811) SpO2:  [94 %-98 %] 95 % (11/11 0405) Weight:  [133 lb (60.328 kg)] 133 lb (60.328 kg) (11/11 0405) Last BM Date: 05/05/14  Intake/Output from previous day: 11/10 0701 - 11/11 0700 In: 110 [I.V.:110] Out: 2200 [Urine:200; Emesis/NG output:2000] Intake/Output this shift:    PE: Abd: soft, Nt, ND, +BS, ostomy with flatus and stool, NGT with bilious output, 300cc since 0600am this morning.  Lab Results:   Recent Labs  05/04/14 0347 05/05/14 0027  WBC 5.5 3.8*  HGB 11.3* 9.8*  HCT 35.3* 30.1*  PLT 270 257   BMET  Recent Labs  05/04/14 0347 05/05/14 0025  NA 142 146  K 3.8 3.3*  CL 103 104  CO2 23 27  GLUCOSE 107* 88  BUN 8 13  CREATININE 0.64 0.69  CALCIUM 9.0 8.3*   PT/INR No results for input(s): LABPROT, INR in the last 72 hours. CMP     Component Value Date/Time   NA 146 05/05/2014 0025   K 3.3* 05/05/2014 0025   CL 104 05/05/2014 0025   CO2 27 05/05/2014 0025   GLUCOSE 88 05/05/2014 0025   BUN 13 05/05/2014 0025   CREATININE 0.69 05/05/2014 0025   CALCIUM 8.3* 05/05/2014 0025   PROT 6.2 05/03/2014 0030   ALBUMIN 3.1* 05/03/2014 0030   AST 21 05/03/2014 0030   ALT 16 05/03/2014 0030   ALKPHOS 97 05/03/2014 0030   BILITOT 0.8 05/03/2014 0030   GFRNONAA >90 05/05/2014 0025   GFRAA >90 05/05/2014 0025   Lipase     Component Value Date/Time   LIPASE 71* 02/25/2013 2121       Studies/Results: Dg Abd 1 View  05/04/2014   CLINICAL DATA:  Nasogastric tube placement under fluoroscopy.  EXAM: ABDOMEN - 1 VIEW  COMPARISON:   None.  FINDINGS: Single portable image shows a nasogastric tube with its tip in the mid to distal stomach.  IMPRESSION: NG tube tip projects in the mid to distal stomach.   Electronically Signed   By: Lajean Manes M.D.   On: 05/04/2014 12:09   Dg Abd 2 Views  05/05/2014   CLINICAL DATA:  History of partial small bowel obstruction.  EXAM: ABDOMEN - 2 VIEW  COMPARISON:  Abdominal films of May 02, 2014  FINDINGS: The bowel gas pattern is nonspecific. There is no evidence of ileus or obstruction. The previously noted mildly distended loop of small bowel overlying the left iliac crest is less conspicuous today. The nasogastric tube tip in proximal port lie in the gastric body with the tip near the pylorus. There is severe levo scoliosis of the thoracic spine and bilateral osteoarthritic change of the hips.  IMPRESSION: There is no evidence of significant small-bowel obstruction currently. Minimal distention of a small bowel loop in the left lower quadrant of the abdomen is demonstrated but improving.   Electronically Signed   By: David  [place]   On: 05/05/2014 08:18   Dg Abd Acute W/chest  05/04/2014   CLINICAL DATA:  Vomiting.  EXAM: ACUTE ABDOMEN SERIES (  ABDOMEN 2 VIEW & CHEST 1 VIEW)  COMPARISON:  05/02/2014.  CT 05/01/2014 .  FINDINGS: Chest x-ray reveals no acute cardiopulmonary disease. Mild cardiomegaly. No CHF. Severe degenerative deformities both shoulders. Severe thoracic spine scoliosis. Soft tissue structures are unremarkable. Persistent distention of small bowel noted without interim change. Small bowel distention up to 3.7 cm noted. Colonic gas pattern is nonspecific. No free air. Aortoiliac atherosclerotic vascular disease. Calcifications within the pelvis most consistent with phleboliths. Soft tissue calcifications over the lower extremities. Severe lumbar spine degenerative change concave right. Severe lumbar spine and bilateral hip degenerative change.  IMPRESSION: Persistent small bowel  distention without interim change from prior exam.   Electronically Signed   By: [place]   On: 05/04/2014 08:43   Dg Loyce Dys Tube Plc W/fl-no Rad  05/04/2014   CLINICAL DATA:    NASO G TUBE PLACEMENT WITH FLUORO  Fluoroscopy was utilized by the requesting physician.  No radiographic  interpretation.     Anti-infectives: Anti-infectives    None       Assessment/Plan  1. SBO  Plan: 1. Abdominal films look better today.  She is passing flatus and stool, but still with a decent amount of NGT output.  Will continue NGT management today and repeat films in the am.  If NGT output decreases, maybe we can try clamping trials tomorrow.   LOS: 4 days    Athene Schuhmacher E 05/05/2014, 9:24 AM Pager: [phone]

## 2014-05-05 NOTE — Progress Notes (Signed)
Follow-up:   Notified by RN regarding possible changes in pt's rhythm. Pt in went into A-Fib w/ RVR on 11/8. Has been on Cardizem qtt and Metoprolol 5 mg IV q6h. She was noted in SR this am however pt has NGT and remains NPO  w/ NGT so Cardizem qtt and Metoprolol continued. RN reports that she noted on telemetry what appeared to be pause vs dropped beats. Pt was completely asymptomatic. 12-Lead EKG obtained and reveals SR w/ 1st degree AV block, PAC's, PVC's and a non-specific t-wave abnormality.  HR 88. Remaining VSS. Discussed EKG w/ Dr Hal Hope who also discussed EKG w/ Dr Aundra Dubin w/ cardiology service who felt complexes in question were PAC's. Will attempt to wean Cardizem qtt down (currently at 10 mg/hr) and continue Metoprolol with parameters. Will continue to monitor closely on telemetry.   Jeryl [place], NP-C [facility] Pager [phone]

## 2014-05-05 NOTE — Progress Notes (Signed)
[facility] PROGRESS NOTE  [name] NGE:[id number] DOB: 1955-12-01 DOA: 05/01/2014 PCP: [name], MD   Brief Narrative: 58 year old female with history of Legally blind, Marfan's syndrome, Hypertension; Substance abuse; CVA,  (2/08); Fibroid; h/o Small bowel obstruction due to adhesions; Rheumatoid arthritis; and Atrial flutter. Presented to the ED with chief complaint of chest pain/abd pain with nausea and vomiting. CT angiogram of the chest was done which ruled out aortic dissection, CTA of the abdomen pelvis revealed high-grade small bowel obstruction. Patient has colostomy bag in place, she haddiverticulitis of colon with perforation status post colectomy/ostomy on 11/28/2012, she also has history of rheumatoid arthritis, and is followed by rheumatologist. She was started on isoniazid for latent tuberculosis in March of this year. Since admission, seen by [facility], had NG placed for decompression, started improving and hence NG clamped and removed per [facility] recs 11/9, Overnight 11/9 with recurrent vomiting and now NG to be replaced. Also has been in Afib RVR on Cardizem gtt   HPI/Subjective: Feels much better, benign abdominal exam, soft. Ostomy bag with gas and loose stools. NGT output documented at 1750 mL since yesterday.  Assessment/Plan:  Small bowel obstruction -High-grade small bowel obstruction, probably due to adhesions -NG was removed per Surgery 11/9, but with recurrence of symptoms since last pm and hence NG replaced on 11/10 -General surgery is following, patient feels much better today. Has loose stools and gas in the ostomy bag.  Essential hypertension Patient takes amlodipine and metoprolol at home, as she is NPO -now on IV metoprolol 5 mg every 6 hours  Afib with RVR -Since 11/8pm, started on Cardizem drip, currently normal sinus rhythm. -IV metoprolol 5mg  Q6, has h/o A flutter per EMR, h/o ablation/followed by Dr.Taylor, not on anticoagulation  chronically. -Developed first-degree AV block known Cardizem drip, currently wean off Cardizem.  History of latent TB Patient has been taking isoniazid since March 2015, Dr.Lama called and discussed with Dr. Megan Salon from infectious disease, patient does not need isolation. Will hold isoniazid at this time, as patient is nothing by mouth for SBO. Restart isoniazid when patient able to take PO  Questionable lesion in the left kidney CT abdomen reveals exophytic lesion from the posterior interpolar left kidney, she will need MRI of the abdomen once more stable.  Thyroid nodule -needs workup as outpatient  Rheumatoid arthritis Patient takes methotrexate injection once a week, will continue with Tylenol PRN rectally, as well as IV morphine PRN  GERD IV Protonix for now.  DVT prophylaxis Lovenox  Code status:Patient is full code Family discussion: none at bedside Dispo: when stable  Consultants:  [facility]   Objective: Filed Vitals:   05/05/14 0811  BP: 125/71  Pulse:   Temp:   Resp: 16    Intake/Output Summary (Last 24 hours) at 05/05/14 1031 Last data filed at 05/05/14 0800  Gross per 24 hour  Intake    110 ml  Output   1950 ml  Net  -1840 ml   Filed Weights   05/01/14 1058 05/01/14 1619 05/05/14 0405  Weight: 63.504 kg (140 lb) 65.454 kg (144 lb 4.8 oz) 60.328 kg (133 lb)    Exam:   General: AAOx3, chronically ill appearing  HEENT: eyes closed, blind  Skeletal deformities noted  Cardiovascular: S1S2/RRR  Respiratory: CTAB  Abdomen: soft, mildly distended, colostomy with stool, BS heard  Musculoskeletal: no edema c/c, deformities noted   Data Reviewed: Basic Metabolic Panel:  Recent Labs Lab 05/01/14 1138 05/01/14 1820 05/02/14 0357 05/03/14 0030  05/04/14 0347 05/05/14 0025  NA 142  --  142 142 142 146  K 3.8  --  4.5 4.0 3.8 3.3*  CL 102  --  106 107 103 104  CO2 24  --  23 21 23 27   GLUCOSE 98  --  103* 94 107* 88  BUN 15  --  16 14 8 13    CREATININE 0.77 0.79 0.79 0.67 0.64 0.69  CALCIUM 9.8  --  9.0 8.5 9.0 8.3*  MG  --   --   --  1.7  --   --    Liver Function Tests:  Recent Labs Lab 05/02/14 0357 05/03/14 0030  AST 27 21  ALT 17 16  ALKPHOS 96 97  BILITOT 0.6 0.8  PROT 6.3 6.2  ALBUMIN 3.2* 3.1*   No results for input(s): LIPASE, AMYLASE in the last 168 hours. No results for input(s): AMMONIA in the last 168 hours. CBC:  Recent Labs Lab 05/01/14 1138 05/01/14 1820 05/02/14 0357 05/03/14 0030 05/04/14 0347 05/05/14 0027  WBC 8.3 6.9 5.9 4.8 5.5 3.8*  NEUTROABS 6.2  --   --   --   --   --   HGB 11.6* 11.5* 11.3* 11.1* 11.3* 9.8*  HCT 35.9* 35.8* 35.8* 35.0* 35.3* 30.1*  MCV 92.1 94.0 93.5 95.1 91.9 92.0  PLT 296 273 294 272 270 257   Cardiac Enzymes:  Recent Labs Lab 05/01/14 1138 05/01/14 1820 05/01/14 2250 05/02/14 0357  TROPONINI <0.30 <0.30 <0.30 <0.30   BNP (last 3 results)  Recent Labs  12/31/13 1116 01/04/14 0245  PROBNP 6068.0* 3355.0*   CBG: No results for input(s): GLUCAP in the last 168 hours.  No results found for this or any previous visit (from the past 240 hour(s)).   Studies: Dg Abd 1 View  05/04/2014   CLINICAL DATA:  Nasogastric tube placement under fluoroscopy.  EXAM: ABDOMEN - 1 VIEW  COMPARISON:  None.  FINDINGS: Single portable image shows a nasogastric tube with its tip in the mid to distal stomach.  IMPRESSION: NG tube tip projects in the mid to distal stomach.   Electronically Signed   By: Lajean Manes M.D.   On: 05/04/2014 12:09   Dg Abd 2 Views  05/05/2014   CLINICAL DATA:  History of partial small bowel obstruction.  EXAM: ABDOMEN - 2 VIEW  COMPARISON:  Abdominal films of May 02, 2014  FINDINGS: The bowel gas pattern is nonspecific. There is no evidence of ileus or obstruction. The previously noted mildly distended loop of small bowel overlying the left iliac crest is less conspicuous today. The nasogastric tube tip in proximal port lie in the  gastric body with the tip near the pylorus. There is severe levo scoliosis of the thoracic spine and bilateral osteoarthritic change of the hips.  IMPRESSION: There is no evidence of significant small-bowel obstruction currently. Minimal distention of a small bowel loop in the left lower quadrant of the abdomen is demonstrated but improving.   Electronically Signed   By: David  Martin[place]   On: 05/05/2014 08:18   Dg Abd Acute W/chest  05/04/2014   CLINICAL DATA:  Vomiting.  EXAM: ACUTE ABDOMEN SERIES (ABDOMEN 2 VIEW & CHEST 1 VIEW)  COMPARISON:  05/02/2014.  CT 05/01/2014 .  FINDINGS: Chest x-ray reveals no acute cardiopulmonary disease. Mild cardiomegaly. No CHF. Severe degenerative deformities both shoulders. Severe thoracic spine scoliosis. Soft tissue structures are unremarkable. Persistent distention of small bowel noted without interim change. Small bowel distention  up to 3.7 cm noted. Colonic gas pattern is nonspecific. No free air. Aortoiliac atherosclerotic vascular disease. Calcifications within the pelvis most consistent with phleboliths. Soft tissue calcifications over the lower extremities. Severe lumbar spine degenerative change concave right. Severe lumbar spine and bilateral hip degenerative change.  IMPRESSION: Persistent small bowel distention without interim change from prior exam.   Electronically Signed   By: [place]   On: 05/04/2014 08:43   Dg Loyce Dys Tube Plc W/fl-no Rad  05/04/2014   CLINICAL DATA:    NASO G TUBE PLACEMENT WITH FLUORO  Fluoroscopy was utilized by the requesting physician.  No radiographic  interpretation.     Scheduled Meds: . enoxaparin (LOVENOX) injection  40 mg Subcutaneous Q24H  . metoprolol  5 mg Intravenous 4 times per day   Continuous Infusions: . sodium chloride 75 mL/hr at 05/05/14 0200  . diltiazem (CARDIZEM) infusion 5 mg/hr (05/05/14 0500)   Antibiotics Given (last 72 hours)    None      Active Problems:   THYROID NODULE   Blindness    Essential hypertension   Marfan's syndrome   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 11/28/2012   Tachycardia   Small bowel obstruction   SBO (small bowel obstruction)   Renal mass    Time spent: 46min    [facility]  [facility] Pager [phone]. If 7PM-7AM, please contact night-coverage at [email or web address], password [facility] 05/05/2014, 10:31 AM  LOS: 4 days

## 2014-05-06 ENCOUNTER — Inpatient Hospital Stay (HOSPITAL_COMMUNITY): Payer: Medicare Other

## 2014-05-06 LAB — BASIC METABOLIC PANEL
Anion gap: 14 (ref 5–15)
Anion gap: 13 (ref 5–15)
BUN: 7 mg/dL (ref 6–23)
BUN: 5 mg/dL — ABNORMAL LOW (ref 6–23)
CO2: 26 mEq/L (ref 19–32)
CO2: 26 mEq/L (ref 19–32)
Calcium: 8.4 mg/dL (ref 8.4–10.5)
Calcium: 9.1 mg/dL (ref 8.4–10.5)
Chloride: 106 mEq/L (ref 96–112)
Chloride: 101 mEq/L (ref 96–112)
Creatinine, Ser: 0.53 mg/dL (ref 0.50–1.10)
Creatinine, Ser: 0.58 mg/dL (ref 0.50–1.10)
GFR calc Af Amer: >90 mL/min (ref >90)
GFR calc Af Amer: >90 mL/min (ref >90)
GFR calc non Af Amer: >90 mL/min (ref >90)
GFR calc non Af Amer: >90 mL/min (ref >90)
Glucose, Bld: 98 mg/dL (ref 70–99)
Glucose, Bld: 104 mg/dL — ABNORMAL HIGH (ref 70–99)
Potassium: 3 mEq/L — ABNORMAL LOW (ref 3.7–5.3)
Potassium: 3.3 mEq/L — ABNORMAL LOW (ref 3.7–5.3)
Sodium: 146 mEq/L (ref 137–147)
Sodium: 140 mEq/L (ref 137–147)

## 2014-05-06 LAB — CBC
HCT: 30.6 % — ABNORMAL LOW (ref 36.0–46.0)
Hemoglobin: 9.8 g/dL — ABNORMAL LOW (ref 12.0–15.0)
MCH: 29.4 pg (ref 26.0–34.0)
MCHC: 32 g/dL (ref 30.0–36.0)
MCV: 91.9 fL (ref 78.0–100.0)
Platelets: 277 10*3/uL (ref 150–400)
RBC: 3.33 MIL/uL — ABNORMAL LOW (ref 3.87–5.11)
RDW: 13.7 % (ref 11.5–15.5)
WBC: 4.3 10*3/uL (ref 4.0–10.5)

## 2014-05-06 LAB — POTASSIUM: Potassium: >7.7 mEq/L (ref 3.7–5.3)

## 2014-05-06 MED ORDER — POTASSIUM CHLORIDE 10 MEQ/100ML IV SOLN
10.0000 meq | INTRAVENOUS | Status: AC
  Administered 2014-05-06 (×2): 10 meq via INTRAVENOUS
  Filled 2014-05-06 (×3): qty 100

## 2014-05-06 MED ORDER — POTASSIUM CHLORIDE 10 MEQ/100ML IV SOLN
10.0000 meq | INTRAVENOUS | Status: AC
  Administered 2014-05-06 (×2): 10 meq via INTRAVENOUS
  Filled 2014-05-06 (×2): qty 100

## 2014-05-06 MED ORDER — POTASSIUM CHLORIDE 10 MEQ/100ML IV SOLN
10.0000 meq | INTRAVENOUS | Status: DC
  Administered 2014-05-06: 10 meq via INTRAVENOUS
  Filled 2014-05-06: qty 100

## 2014-05-06 MED ORDER — MAGNESIUM SULFATE 2 GM/50ML IV SOLN
2.0000 g | Freq: Once | INTRAVENOUS | Status: AC
  Administered 2014-05-06: 2 g via INTRAVENOUS
  Filled 2014-05-06: qty 50

## 2014-05-06 NOTE — Evaluation (Signed)
Physical Therapy Evaluation Patient Details Name: [name] MRN: [id number] DOB: May 20, 1956 Today's Date: 05/06/2014   History of Present Illness  58 year old female with history of Legally blind, Marfan's syndrome, Hypertension; Substance abuse; CVA,  (2/08); Fibroid; h/o Small bowel obstruction due to adhesions; Rheumatoid arthritis; and Atrial flutter. Presented to the ED with chief complaint of chest pain/abd pain with nausea and vomiting. status post colectomy/ostomy on 11/28/2012, she also has history of rheumatoid arthritis, and is followed by rheumatologist. Presents with small bowel obstruction. NG tube has been placed and is clamped 11/12.   Clinical Impression  Pt presents with the below listed impairments and would benefit from skilled PT intervention to address these impairments and increase functional independence with mobility. Pt required A from daughter and hired help PTA and she will continue to require this A. Recommend HHPT for follow up and daughter may need to provide more A initially at d/c. If unable, pt may benefit from short term SNF placement as she lives alone.     Follow Up Recommendations Home health PT;Supervision for mobility/OOB    Equipment Recommendations  None recommended by PT (Pt owns RW already)    Recommendations for Other Services       Precautions / Restrictions Precautions Precautions: Fall Precaution Comments: Pt is blind Required Braces or Orthoses: Other Brace/Splint Other Brace/Splint: Pt has orthotics in her bedroom shoes that she always wears Restrictions Weight Bearing Restrictions: No Other Position/Activity Restrictions: BUE and BLE deformities chronic      Mobility  Bed Mobility Overal bed mobility: Needs Assistance Bed Mobility: Supine to Sit;Sit to Supine     Supine to sit: Min guard Sit to supine: Supervision   General bed mobility comments: cues for technique and hand placement (due to blindness requires cues for  safety with management of IV and NG tube)  Transfers Overall transfer level: Needs assistance Equipment used: Rolling walker (2 wheeled) Transfers: Stand Pivot Transfers;Sit to/from Stand Sit to Stand: Mod assist;Max assist Stand pivot transfers: Min assist       General transfer comment: Required mod A for sit to stand from bed and max A for sit to stand from low recliner.. Once pt up requires min A for balance with RW for transfer and cues for direction due to blindness.  Ambulation/Gait Ambulation/Gait assistance: Min assist;Mod assist Ambulation Distance (Feet):  (sidestepping EOB and a few steps over to recliner.) Assistive device: Rolling walker (2 wheeled)       General Gait Details: needs further assessment for full gait trial. Pt reports only walking shorter household distances PTA  Stairs            Wheelchair Mobility    Modified Rankin (Stroke Patients Only)       Balance Overall balance assessment: Needs assistance Sitting-balance support: Bilateral upper extremity supported;Single extremity supported Sitting balance-Leahy Scale: Poor Sitting balance - Comments: Pt required min A to maintain balance with tendency for posterior lean when LOB occurred. Cues to lean forward during mobility. Postural control: Posterior lean Standing balance support: Bilateral upper extremity supported;Single extremity supported Standing balance-Leahy Scale: Poor                               Pertinent Vitals/Pain Pain Assessment: Faces Faces Pain Scale: Hurts a little bit Pain Location: generalized pain "all over" Pain Intervention(s): Monitored during session;Repositioned    Home Living Family/patient expects to be discharged to:: Private residence Living Arrangements:  Children Available Help at Discharge: Personal care attendant Type of Home: Apartment Home Access: Level entry     Home Layout: One level Home Equipment: Walker - 2 wheels;Bedside  commode;Shower seat Additional Comments: Pt lived alone with assitance from her daughter for bathing and dressing as well as to manage colostomy. Pt has personal aid that comes in 2 hours a day to A with household management.    Prior Function Level of Independence: Needs assistance   Gait / Transfers Assistance Needed: Pt reports using RW in the home.  ADL's / Homemaking Assistance Needed: Pt reports daughter assisted with bathing and dressing needs  Comments: Pt reports daughter also assisted with management of colostomy     Hand Dominance        Extremity/Trunk Assessment   Upper Extremity Assessment: RUE deficits/detail;LUE deficits/detail RUE Deficits / Details: deformities at the hand, limited shoulder and elbow ROM     LUE Deficits / Details: deformities at the hand, limited shoulder and elbow ROM   Lower Extremity Assessment: RLE deficits/detail;LLE deficits/detail RLE Deficits / Details: generalized weakness; limited ROM at knees and ankles. Pt with very pronated feet and inverted when in standing. maintains knees flexed LLE Deficits / Details: generalized weakness; limited ROM at knees and ankles. Pt with very pronated feet and inverted when in standing. maintains knees flexed  Cervical / Trunk Assessment: Kyphotic  Communication   Communication: HOH (Bilateral hearing aids)  Cognition Arousal/Alertness: Awake/alert Behavior During Therapy: WFL for tasks assessed/performed Overall Cognitive Status: Within Functional Limits for tasks assessed                      General Comments      Exercises        Assessment/Plan    PT Assessment Patient needs continued PT services  PT Diagnosis Difficulty walking;Generalized weakness;Other (comment) (contractures of joints)   PT Problem List Decreased strength;Decreased range of motion;Decreased activity tolerance;Decreased balance;Decreased mobility;Decreased knowledge of use of DME;Cardiopulmonary status  limiting activity;Pain  PT Treatment Interventions DME instruction;Gait training;Stair training;Functional mobility training;Therapeutic activities;Therapeutic exercise;Balance training;Neuromuscular re-education;Patient/family education   PT Goals (Current goals can be found in the Care Plan section) Acute Rehab PT Goals Patient Stated Goal: go home PT Goal Formulation: With patient Time For Goal Achievement: 05/13/14 Potential to Achieve Goals: Fair    Frequency Min 3X/week   Barriers to discharge Decreased caregiver support Pt lives alone and requires assist from daughter for ADLs and hired help for household management.     Co-evaluation               End of Session Equipment Utilized During Treatment: Gait belt Activity Tolerance: Patient limited by fatigue Patient left: in bed;with call bell/phone within reach Nurse Communication: Mobility status;Other (comment) (pt declined staying up in recliner; returned back to bed)         Time: 8756-4332 PT Time Calculation (min) (ACUTE ONLY): 31 min   Charges:   PT Evaluation $Initial PT Evaluation Tier I: 1 Procedure PT Treatments $Therapeutic Activity: 8-22 mins   PT G Codes:          Allayne Gitelman 05/06/2014, 3:17 PM

## 2014-05-06 NOTE — Progress Notes (Signed)
Patient ID: [name], female   DOB: 1955/11/25, 58 y.o.   MRN: [id number]    Subjective: Pt feels well with no abdominal pain.  Hungry.  Objective: Vital signs in last 24 hours: Temp:  [98.1 F (36.7 C)-98.7 F (37.1 C)] 98.6 F (37 C) (11/12 0430) Pulse Rate:  [70-86] 80 (11/12 0430) Resp:  [14-16] 16 (11/12 0430) BP: (125-157)/(71-110) 138/80 mmHg (11/12 0430) SpO2:  [96 %-100 %] 100 % (11/12 0430) Weight:  [132 lb 3.2 oz (59.966 kg)] 132 lb 3.2 oz (59.966 kg) (11/12 0430) Last BM Date: 05/05/14  Intake/Output from previous day: 11/11 0701 - 11/12 0700 In: 58.8 [I.V.:38.8] Out: 1450 [Emesis/NG output:1450] Intake/Output this shift:    PE: Abd: soft, NT, ND, +BS, ostomy with stool and air.  NGT with some watered down bilious output  Lab Results:   Recent Labs  05/05/14 0027 05/06/14 0350  WBC 3.8* 4.3  HGB 9.8* 9.8*  HCT 30.1* 30.6*  PLT 257 277   BMET  Recent Labs  05/05/14 0025 05/06/14 0350  NA 146 146  K 3.3* 3.0*  CL 104 106  CO2 27 26  GLUCOSE 88 98  BUN 13 7  CREATININE 0.69 0.53  CALCIUM 8.3* 8.4   PT/INR No results for input(s): LABPROT, INR in the last 72 hours. CMP     Component Value Date/Time   NA 146 05/06/2014 0350   K 3.0* 05/06/2014 0350   CL 106 05/06/2014 0350   CO2 26 05/06/2014 0350   GLUCOSE 98 05/06/2014 0350   BUN 7 05/06/2014 0350   CREATININE 0.53 05/06/2014 0350   CALCIUM 8.4 05/06/2014 0350   PROT 6.2 05/03/2014 0030   ALBUMIN 3.1* 05/03/2014 0030   AST 21 05/03/2014 0030   ALT 16 05/03/2014 0030   ALKPHOS 97 05/03/2014 0030   BILITOT 0.8 05/03/2014 0030   GFRNONAA >90 05/06/2014 0350   GFRAA >90 05/06/2014 0350   Lipase     Component Value Date/Time   LIPASE 71* 02/25/2013 2121       Studies/Results: Dg Abd 1 View  05/04/2014   CLINICAL DATA:  Nasogastric tube placement under fluoroscopy.  EXAM: ABDOMEN - 1 VIEW  COMPARISON:  None.  FINDINGS: Single portable image shows a nasogastric tube with  its tip in the mid to distal stomach.  IMPRESSION: NG tube tip projects in the mid to distal stomach.   Electronically Signed   By: Lajean Manes M.D.   On: 05/04/2014 12:09   Dg Abd 2 Views  05/06/2014   CLINICAL DATA:  Abdominal pain, small bowel obstruction, nasogastric tube  EXAM: ABDOMEN - 2 VIEW  COMPARISON:  05/05/2014  FINDINGS: Tip of nasogastric tube projects over distal gastric antrum.  Persistent dilatation of a few small bowel loops in the abdomen compatible with small bowel obstruction, loops slightly increased in size since previous exam.  Ostomy LEFT mid abdomen.  No definite bowel wall thickening or free intraperitoneal air.  Small amount colonic gas noted.  Bones demineralized with marked levoconvex thoracolumbar scoliosis and extensive degenerative disc/ facet disease changes of the thoracolumbar spine.  IMPRESSION: Persistent small bowel obstruction.   Electronically Signed   By: Lavonia Dana M.D.   On: 05/06/2014 08:03   Dg Abd 2 Views  05/05/2014   CLINICAL DATA:  History of partial small bowel obstruction.  EXAM: ABDOMEN - 2 VIEW  COMPARISON:  Abdominal films of May 02, 2014  FINDINGS: The bowel gas pattern is nonspecific. There is no  evidence of ileus or obstruction. The previously noted mildly distended loop of small bowel overlying the left iliac crest is less conspicuous today. The nasogastric tube tip in proximal port lie in the gastric body with the tip near the pylorus. There is severe levo scoliosis of the thoracic spine and bilateral osteoarthritic change of the hips.  IMPRESSION: There is no evidence of significant small-bowel obstruction currently. Minimal distention of a small bowel loop in the left lower quadrant of the abdomen is demonstrated but improving.   Electronically Signed   By: David  [place]   On: 05/05/2014 08:18   Dg Abd Acute W/chest  05/04/2014   CLINICAL DATA:  Vomiting.  EXAM: ACUTE ABDOMEN SERIES (ABDOMEN 2 VIEW & CHEST 1 VIEW)  COMPARISON:   05/02/2014.  CT 05/01/2014 .  FINDINGS: Chest x-ray reveals no acute cardiopulmonary disease. Mild cardiomegaly. No CHF. Severe degenerative deformities both shoulders. Severe thoracic spine scoliosis. Soft tissue structures are unremarkable. Persistent distention of small bowel noted without interim change. Small bowel distention up to 3.7 cm noted. Colonic gas pattern is nonspecific. No free air. Aortoiliac atherosclerotic vascular disease. Calcifications within the pelvis most consistent with phleboliths. Soft tissue calcifications over the lower extremities. Severe lumbar spine degenerative change concave right. Severe lumbar spine and bilateral hip degenerative change.  IMPRESSION: Persistent small bowel distention without interim change from prior exam.   Electronically Signed   By: [place]   On: 05/04/2014 08:43   Dg Loyce Dys Tube Plc W/fl-no Rad  05/04/2014   CLINICAL DATA:    NASO G TUBE PLACEMENT WITH FLUORO  Fluoroscopy was utilized by the requesting physician.  No radiographic  interpretation.     Anti-infectives: Anti-infectives    None       Assessment/Plan  1. PSBO  Plan: 1. Her abdominal films look normal except for one loop in the pelvis that still appears mildly dilated.  Her abdominal exam is benign and she continues to put air and stool out in her colostomy.  Will d/w Dr. Redmond Pulling, but will likely clamp her NGT today and let her have clear liquids with her tube in place.     LOS: 5 days    Issiah Huffaker E 05/06/2014, 8:08 AM Pager: [phone]

## 2014-05-06 NOTE — Progress Notes (Signed)
   05/06/14 1100  Clinical Encounter Type  Visited With Patient not available  Visit Type Initial;Spiritual support  Referral From Nurse  Consult/Referral To Chaplain   Pt on phone when chaplain came by.   Vanetta Mulders 05/06/2014 11:26 AM

## 2014-05-06 NOTE — Progress Notes (Signed)
CRITICAL VALUE ALERT  Critical value received:  Potassium   Date of notification:  05/06/2014  Time of notification: 3532  Critical value read back: yes  Nurse who received alert:  Gerlene Fee  MD notified (1st page):  Dr. Betsey Holiday  Time of first page:  1520  MD notified (2nd page):  Time of second page:  Responding MD:  Dr. Betsey Holiday  Time MD responded:  [phone]

## 2014-05-06 NOTE — Progress Notes (Signed)
[facility] PROGRESS NOTE  [name] HER:[id number] DOB: 12/01/1955 DOA: 05/01/2014 PCP: [name], MD   Brief Narrative: 58 year old female with history of Legally blind, Marfan's syndrome, Hypertension; Substance abuse; CVA,  (2/08); Fibroid; h/o Small bowel obstruction due to adhesions; Rheumatoid arthritis; and Atrial flutter. Presented to the ED with chief complaint of chest pain/abd pain with nausea and vomiting. CT angiogram of the chest was done which ruled out aortic dissection, CTA of the abdomen pelvis revealed high-grade small bowel obstruction. Patient has colostomy bag in place, she haddiverticulitis of colon with perforation status post colectomy/ostomy on 11/28/2012, she also has history of rheumatoid arthritis, and is followed by rheumatologist. She was started on isoniazid for latent tuberculosis in March of this year. Since admission, seen by CCS, had NG placed for decompression, started improving and hence NG clamped and removed per CCS recs 11/9, Overnight 11/9 with recurrent vomiting and now NG to be replaced. Also has been in Afib RVR on Cardizem gtt   HPI/Subjective: He has no complaints today, wants to try food. Per general surgery climbed a NG tube, sips of clears today. Ambulate as tolerated.  Assessment/Plan:  Small bowel obstruction -High-grade small bowel obstruction, probably due to adhesions -NG was removed per Surgery 11/9, but with recurrence of symptoms since last pm and hence NG replaced on 11/10 -General surgery is following, patient feels much better today. Has loose stools and gas in the ostomy bag. -Clamp the tube and ambulate, sips of clears.  Essential hypertension Patient takes amlodipine and metoprolol at home, as she is NPO -now on IV metoprolol 5 mg every 6 hours  Afib with RVR -Since 11/8pm, started on Cardizem drip, currently normal sinus rhythm. -IV metoprolol 5mg  Q6, has h/o A flutter per EMR, h/o ablation/followed by  Dr.Taylor, not on anticoagulation chronically. -Developed first-degree AV block known Cardizem drip, currently wean off Cardizem.  History of latent TB Patient has been taking isoniazid since March 2015, Dr.Lama called and discussed with Dr. Megan Salon from infectious disease, patient does not need isolation. Will hold isoniazid at this time, as patient is nothing by mouth for SBO. Restart isoniazid when patient able to take PO.  Questionable lesion in the left kidney CT abdomen reveals exophytic lesion from the posterior interpolar left kidney, she will need MRI of the abdomen once more stable.  Thyroid nodule -needs workup as outpatient  Rheumatoid arthritis Patient takes methotrexate injection once a week, will continue with Tylenol PRN rectally, as well as IV morphine PRN.  Hyperkalemia -Potassium of 3.0, likely secondary to gastric suction, treat with IV supplements.  GERD IV Protonix for now.  DVT prophylaxis Lovenox  Code status:Patient is full code Family discussion: none at bedside Dispo: when stable  Consultants:  CCS   Objective: Filed Vitals:   05/06/14 0815  BP: 136/97  Pulse: 71  Temp: 98.6 F (37 C)  Resp: 16    Intake/Output Summary (Last 24 hours) at 05/06/14 1133 Last data filed at 05/06/14 0900  Gross per 24 hour  Intake  38.75 ml  Output   1450 ml  Net -1411.25 ml   Filed Weights   05/01/14 1619 05/05/14 0405 05/06/14 0430  Weight: 65.454 kg (144 lb 4.8 oz) 60.328 kg (133 lb) 59.966 kg (132 lb 3.2 oz)    Exam:   General: AAOx3, chronically ill appearing  HEENT: eyes closed, blind  Skeletal deformities noted  Cardiovascular: S1S2/RRR  Respiratory: CTAB  Abdomen: soft, mildly distended, colostomy with stool, BS heard  Musculoskeletal: no edema c/c, deformities noted   Data Reviewed: Basic Metabolic Panel:  Recent Labs Lab 05/02/14 0357 05/03/14 0030 05/04/14 0347 05/05/14 0025 05/06/14 0350  NA 142 142 142 146 146  K  4.5 4.0 3.8 3.3* 3.0*  CL 106 107 103 104 106  CO2 23 21 23 27 26   GLUCOSE 103* 94 107* 88 98  BUN 16 14 8 13 7   CREATININE 0.79 0.67 0.64 0.69 0.53  CALCIUM 9.0 8.5 9.0 8.3* 8.4  MG  --  1.7  --   --   --    Liver Function Tests:  Recent Labs Lab 05/02/14 0357 05/03/14 0030  AST 27 21  ALT 17 16  ALKPHOS 96 97  BILITOT 0.6 0.8  PROT 6.3 6.2  ALBUMIN 3.2* 3.1*   No results for input(s): LIPASE, AMYLASE in the last 168 hours. No results for input(s): AMMONIA in the last 168 hours. CBC:  Recent Labs Lab 05/01/14 1138  05/02/14 0357 05/03/14 0030 05/04/14 0347 05/05/14 0027 05/06/14 0350  WBC 8.3  < > 5.9 4.8 5.5 3.8* 4.3  NEUTROABS 6.2  --   --   --   --   --   --   HGB 11.6*  < > 11.3* 11.1* 11.3* 9.8* 9.8*  HCT 35.9*  < > 35.8* 35.0* 35.3* 30.1* 30.6*  MCV 92.1  < > 93.5 95.1 91.9 92.0 91.9  PLT 296  < > 294 272 270 257 277  < > = values in this interval not displayed. Cardiac Enzymes:  Recent Labs Lab 05/01/14 1138 05/01/14 1820 05/01/14 2250 05/02/14 0357  TROPONINI <0.30 <0.30 <0.30 <0.30   BNP (last 3 results)  Recent Labs  12/31/13 1116 01/04/14 0245  PROBNP 6068.0* 3355.0*   CBG: No results for input(s): GLUCAP in the last 168 hours.  No results found for this or any previous visit (from the past 240 hour(s)).   Studies: Dg Abd 1 View  05/04/2014   CLINICAL DATA:  Nasogastric tube placement under fluoroscopy.  EXAM: ABDOMEN - 1 VIEW  COMPARISON:  None.  FINDINGS: Single portable image shows a nasogastric tube with its tip in the mid to distal stomach.  IMPRESSION: NG tube tip projects in the mid to distal stomach.   Electronically Signed   By: Lajean Manes M.D.   On: 05/04/2014 12:09   Dg Abd 2 Views  05/06/2014   CLINICAL DATA:  Abdominal pain, small bowel obstruction, nasogastric tube  EXAM: ABDOMEN - 2 VIEW  COMPARISON:  05/05/2014  FINDINGS: Tip of nasogastric tube projects over distal gastric antrum.  Persistent dilatation of a few  small bowel loops in the abdomen compatible with small bowel obstruction, loops slightly increased in size since previous exam.  Ostomy LEFT mid abdomen.  No definite bowel wall thickening or free intraperitoneal air.  Small amount colonic gas noted.  Bones demineralized with marked levoconvex thoracolumbar scoliosis and extensive degenerative disc/ facet disease changes of the thoracolumbar spine.  IMPRESSION: Persistent small bowel obstruction.   Electronically Signed   By: Lavonia Dana M.D.   On: 05/06/2014 08:03   Dg Abd 2 Views  05/05/2014   CLINICAL DATA:  History of partial small bowel obstruction.  EXAM: ABDOMEN - 2 VIEW  COMPARISON:  Abdominal films of May 02, 2014  FINDINGS: The bowel gas pattern is nonspecific. There is no evidence of ileus or obstruction. The previously noted mildly distended loop of small bowel overlying the left iliac crest is less conspicuous  today. The nasogastric tube tip in proximal port lie in the gastric body with the tip near the pylorus. There is severe levo scoliosis of the thoracic spine and bilateral osteoarthritic change of the hips.  IMPRESSION: There is no evidence of significant small-bowel obstruction currently. Minimal distention of a small bowel loop in the left lower quadrant of the abdomen is demonstrated but improving.   Electronically Signed   By: David  Martinique   On: 05/05/2014 08:18   Dg Loyce Dys Tube Plc W/fl-no Rad  05/04/2014   CLINICAL DATA:    NASO G TUBE PLACEMENT WITH FLUORO  Fluoroscopy was utilized by the requesting physician.  No radiographic  interpretation.     Scheduled Meds: . enoxaparin (LOVENOX) injection  40 mg Subcutaneous Q24H  . metoprolol  5 mg Intravenous 4 times per day  . pantoprazole (PROTONIX) IV  40 mg Intravenous Q12H   Continuous Infusions: . dextrose 5 % and 0.9% NaCl 1,000 mL with potassium chloride 20 mEq infusion 75 mL/hr at 05/05/14 2344  . diltiazem (CARDIZEM) infusion 5 mg/hr (05/05/14 2139)   Antibiotics  Given (last 72 hours)    None      Active Problems:   THYROID NODULE   Blindness   Essential hypertension   Marfan's syndrome   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 11/28/2012   Tachycardia   Small bowel obstruction   SBO (small bowel obstruction)   Renal mass    Time spent: 45min    Ovila Lepage A  [facility] Pager [phone]. If 7PM-7AM, please contact night-coverage at [email or web address], password [facility] 05/06/2014, 11:33 AM  LOS: 5 days

## 2014-05-07 LAB — CBC
HCT: 35.2 % — ABNORMAL LOW (ref 36.0–46.0)
Hemoglobin: 11.6 g/dL — ABNORMAL LOW (ref 12.0–15.0)
MCH: 29.7 pg (ref 26.0–34.0)
MCHC: 33 g/dL (ref 30.0–36.0)
MCV: 90.3 fL (ref 78.0–100.0)
Platelets: 304 10*3/uL (ref 150–400)
RBC: 3.9 MIL/uL (ref 3.87–5.11)
RDW: 13.7 % (ref 11.5–15.5)
WBC: 6.9 10*3/uL (ref 4.0–10.5)

## 2014-05-07 LAB — BASIC METABOLIC PANEL
Anion gap: 13 (ref 5–15)
BUN: 3 mg/dL — ABNORMAL LOW (ref 6–23)
CO2: 22 mEq/L (ref 19–32)
Calcium: 9 mg/dL (ref 8.4–10.5)
Chloride: 103 mEq/L (ref 96–112)
Creatinine, Ser: 0.53 mg/dL (ref 0.50–1.10)
GFR calc Af Amer: >90 mL/min (ref >90)
GFR calc non Af Amer: >90 mL/min (ref >90)
Glucose, Bld: 97 mg/dL (ref 70–99)
Potassium: 3.9 mEq/L (ref 3.7–5.3)
Sodium: 138 mEq/L (ref 137–147)

## 2014-05-07 LAB — MAGNESIUM: Magnesium: 1.9 mg/dL (ref 1.5–2.5)

## 2014-05-07 MED ORDER — SODIUM CHLORIDE 0.9 % IV SOLN
INTRAVENOUS | Status: DC | PRN
  Administered 2014-05-07: 12:00:00 via INTRAVENOUS

## 2014-05-07 MED ORDER — DILTIAZEM HCL 30 MG PO TABS
30.0000 mg | ORAL_TABLET | Freq: Four times a day (QID) | ORAL | Status: DC
  Administered 2014-05-07 – 2014-05-11 (×18): 30 mg via ORAL
  Filled 2014-05-07 (×18): qty 1

## 2014-05-07 NOTE — Progress Notes (Signed)
[facility] PROGRESS NOTE  LEATA DOMINY DXA:[id number] DOB: 04/28/1956 DOA: 05/01/2014 PCP: [name], MD   Brief Narrative: 58 year old female with history of Legally blind, Marfan's syndrome, Hypertension; Substance abuse; CVA,  (2/08); Fibroid; h/o Small bowel obstruction due to adhesions; Rheumatoid arthritis; and Atrial flutter. Presented to the ED with chief complaint of chest pain/abd pain with nausea and vomiting. CT angiogram of the chest was done which ruled out aortic dissection, CTA of the abdomen pelvis revealed high-grade small bowel obstruction. Patient has colostomy bag in place, she haddiverticulitis of colon with perforation status post colectomy/ostomy on 11/28/2012, she also has history of rheumatoid arthritis, and is followed by rheumatologist. She was started on isoniazid for latent tuberculosis in March of this year. Since admission, seen by [facility], had NG placed for decompression, started improving and hence NG clamped and removed per CCS recs 11/9, Overnight 11/9 with recurrent vomiting and now NG to be replaced. Also has been in Afib RVR on Cardizem gtt   HPI/Subjective: No complaints overall, no nausea or vomiting. General surgery starting clear liquids.  Assessment/Plan:  Small bowel obstruction -High-grade small bowel obstruction, probably due to adhesions -NG was removed per Surgery 11/9, but with recurrence of symptoms since last pm and hence NG replaced on 11/10 -General surgery is following, patient feels much better today. Has loose stools and gas in the ostomy bag. -Clamp the tube and ambulate, sips of clears.  Essential hypertension -Patient takes amlodipine and metoprolol at home, as she is NPO -now on IV metoprolol 5 mg every 6 hours  Afib with RVR -Since 11/8pm, started on Cardizem drip, currently normal sinus rhythm. -IV metoprolol 5mg  Q6, has h/o A flutter per EMR, h/o ablation/followed by Dr.Taylor, not on anticoagulation  chronically. -Developed first-degree AV block known Cardizem drip, currently wean off Cardizem. -We'll discontinue the Cardizem drip and switched to oral Cardizem today.  History of latent TB Patient has been taking isoniazid since March 2015, Dr.Lama called and discussed with Dr. Megan Salon from infectious disease, patient does not need isolation. Will hold isoniazid at this time, as patient is nothing by mouth for SBO. Restart isoniazid when patient able to take PO.  Questionable lesion in the left kidney CT abdomen reveals exophytic lesion from the posterior interpolar left kidney, she will need MRI of the abdomen once more stable.  Thyroid nodule -needs workup as outpatient  Rheumatoid arthritis Patient takes methotrexate injection once a week, will continue with Tylenol PRN rectally, as well as IV morphine PRN.  Hyperkalemia -Potassium of 3.0, likely secondary to gastric suction, treat with IV supplements.  GERD IV Protonix for now.  DVT prophylaxis Lovenox  Code status:Patient is full code Family discussion: none at bedside Dispo: when stable  Consultants:  CCS   Objective: Filed Vitals:   05/07/14 0428  BP: 124/89  Pulse: 93  Temp: 98.4 F (36.9 C)  Resp: 16    Intake/Output Summary (Last 24 hours) at 05/07/14 1015 Last data filed at 05/07/14 0634  Gross per 24 hour  Intake    360 ml  Output   1150 ml  Net   -790 ml   Filed Weights   05/05/14 0405 05/06/14 0430 05/07/14 0428  Weight: 60.328 kg (133 lb) 59.966 kg (132 lb 3.2 oz) 59.512 kg (131 lb 3.2 oz)    Exam:   General: AAOx3, chronically ill appearing  HEENT: eyes closed, blind  Skeletal deformities noted  Cardiovascular: S1S2/RRR  Respiratory: CTAB  Abdomen: soft, mildly distended, colostomy with  stool, BS heard  Musculoskeletal: no edema c/c, deformities noted   Data Reviewed: Basic Metabolic Panel:  Recent Labs Lab 05/03/14 0030 05/04/14 0347 05/05/14 0025 05/06/14 0350  05/06/14 1400 05/06/14 1540 05/07/14 0526  NA 142 142 146 146  --  140 138  K 4.0 3.8 3.3* 3.0* >7.7* 3.3* 3.9  CL 107 103 104 106  --  101 103  CO2 21 23 27 26   --  26 22  GLUCOSE 94 107* 88 98  --  104* 97  BUN 14 8 13 7   --  5* 3*  CREATININE 0.67 0.64 0.69 0.53  --  0.58 0.53  CALCIUM 8.5 9.0 8.3* 8.4  --  9.1 9.0  MG 1.7  --   --   --   --   --  1.9   Liver Function Tests:  Recent Labs Lab 05/02/14 0357 05/03/14 0030  AST 27 21  ALT 17 16  ALKPHOS 96 97  BILITOT 0.6 0.8  PROT 6.3 6.2  ALBUMIN 3.2* 3.1*   No results for input(s): LIPASE, AMYLASE in the last 168 hours. No results for input(s): AMMONIA in the last 168 hours. CBC:  Recent Labs Lab 05/01/14 1138  05/03/14 0030 05/04/14 0347 05/05/14 0027 05/06/14 0350 05/07/14 0526  WBC 8.3  < > 4.8 5.5 3.8* 4.3 6.9  NEUTROABS 6.2  --   --   --   --   --   --   HGB 11.6*  < > 11.1* 11.3* 9.8* 9.8* 11.6*  HCT 35.9*  < > 35.0* 35.3* 30.1* 30.6* 35.2*  MCV 92.1  < > 95.1 91.9 92.0 91.9 90.3  PLT 296  < > 272 270 257 277 304  < > = values in this interval not displayed. Cardiac Enzymes:  Recent Labs Lab 05/01/14 1138 05/01/14 1820 05/01/14 2250 05/02/14 0357  TROPONINI <0.30 <0.30 <0.30 <0.30   BNP (last 3 results)  Recent Labs  12/31/13 1116 01/04/14 0245  PROBNP 6068.0* 3355.0*   CBG: No results for input(s): GLUCAP in the last 168 hours.  No results found for this or any previous visit (from the past 240 hour(s)).   Studies: Dg Abd 2 Views  05/06/2014   CLINICAL DATA:  Abdominal pain, small bowel obstruction, nasogastric tube  EXAM: ABDOMEN - 2 VIEW  COMPARISON:  05/05/2014  FINDINGS: Tip of nasogastric tube projects over distal gastric antrum.  Persistent dilatation of a few small bowel loops in the abdomen compatible with small bowel obstruction, loops slightly increased in size since previous exam.  Ostomy LEFT mid abdomen.  No definite bowel wall thickening or free intraperitoneal air.  Small  amount colonic gas noted.  Bones demineralized with marked levoconvex thoracolumbar scoliosis and extensive degenerative disc/ facet disease changes of the thoracolumbar spine.  IMPRESSION: Persistent small bowel obstruction.   Electronically Signed   By: Lavonia Dana M.D.   On: 05/06/2014 08:03    Scheduled Meds: . enoxaparin (LOVENOX) injection  40 mg Subcutaneous Q24H  . metoprolol  5 mg Intravenous 4 times per day  . pantoprazole (PROTONIX) IV  40 mg Intravenous Q12H   Continuous Infusions: . dextrose 5 % and 0.9% NaCl 1,000 mL with potassium chloride 20 mEq infusion 75 mL/hr at 05/06/14 1717  . diltiazem (CARDIZEM) infusion 5 mg/hr (05/07/14 0742)   Antibiotics Given (last 72 hours)    None      Active Problems:   THYROID NODULE   Blindness   Essential hypertension  Marfan's syndrome   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 11/28/2012   Tachycardia   Small bowel obstruction   SBO (small bowel obstruction)   Renal mass    Time spent: 25 min    [facility] Pager [phone]. If 7PM-7AM, please contact night-coverage at [email or web address], password [facility] 05/07/2014, 10:15 AM  LOS: 6 days

## 2014-05-07 NOTE — Progress Notes (Signed)
Patient ID: [name], female   DOB: July 07, 1955, 58 y.o.   MRN: [id number]    Subjective: Pt doing well with sips of clear liquids while NG clamped.  No nausea or vomiting.    Objective: Vital signs in last 24 hours: Temp:  [98.2 F (36.8 C)-98.4 F (36.9 C)] 98.4 F (36.9 C) (11/13 0428) Pulse Rate:  [64-93] 93 (11/13 0428) Resp:  [16-18] 16 (11/13 0428) BP: (124-148)/(87-90) 124/89 mmHg (11/13 0428) SpO2:  [98 %-100 %] 99 % (11/13 0428) Weight:  [131 lb 3.2 oz (59.512 kg)] 131 lb 3.2 oz (59.512 kg) (11/13 0428) Last BM Date: 05/05/14  Intake/Output from previous day: 2023/06/03 0701 - 11/13 0700 In: 360 [P.O.:360] Out: 1450 [Urine:950; Emesis/NG output:300; Stool:200] Intake/Output this shift:    PE: Abd: soft, NT, ND, +BS, ostomy with air and stool  Lab Results:   Recent Labs  2014/06/02 0350 05/07/14 0526  WBC 4.3 6.9  HGB 9.8* 11.6*  HCT 30.6* 35.2*  PLT 277 304   BMET  Recent Labs  06-02-14 1540 05/07/14 0526  NA 140 138  K 3.3* 3.9  CL 101 103  CO2 26 22  GLUCOSE 104* 97  BUN 5* 3*  CREATININE 0.58 0.53  CALCIUM 9.1 9.0   PT/INR No results for input(s): LABPROT, INR in the last 72 hours. CMP     Component Value Date/Time   NA 138 05/07/2014 0526   K 3.9 05/07/2014 0526   CL 103 05/07/2014 0526   CO2 22 05/07/2014 0526   GLUCOSE 97 05/07/2014 0526   BUN 3* 05/07/2014 0526   CREATININE 0.53 05/07/2014 0526   CALCIUM 9.0 05/07/2014 0526   PROT 6.2 05/03/2014 0030   ALBUMIN 3.1* 05/03/2014 0030   AST 21 05/03/2014 0030   ALT 16 05/03/2014 0030   ALKPHOS 97 05/03/2014 0030   BILITOT 0.8 05/03/2014 0030   GFRNONAA >90 05/07/2014 0526   GFRAA >90 05/07/2014 0526   Lipase     Component Value Date/Time   LIPASE 71* 02/25/2013 2121       Studies/Results: Dg Abd 2 Views  2014/06/02   CLINICAL DATA:  Abdominal pain, small bowel obstruction, nasogastric tube  EXAM: ABDOMEN - 2 VIEW  COMPARISON:  05/05/2014  FINDINGS: Tip of nasogastric  tube projects over distal gastric antrum.  Persistent dilatation of a few small bowel loops in the abdomen compatible with small bowel obstruction, loops slightly increased in size since previous exam.  Ostomy LEFT mid abdomen.  No definite bowel wall thickening or free intraperitoneal air.  Small amount colonic gas noted.  Bones demineralized with marked levoconvex thoracolumbar scoliosis and extensive degenerative disc/ facet disease changes of the thoracolumbar spine.  IMPRESSION: Persistent small bowel obstruction.   Electronically Signed   By: Lavonia Dana M.D.   On: 06-02-2014 08:03    Anti-infectives: Anti-infectives    None       Assessment/Plan  1. PSBO, resolving  Plan: 1. DC NGT and give clear liquids   LOS: 6 days    Gearld Kerstein E 05/07/2014, 9:02 AM Pager: [phone]

## 2014-05-07 NOTE — Evaluation (Signed)
Occupational Therapy Evaluation Patient Details Name: [name] MRN: [id number] DOB: Nov 06, 1955 Today's Date: 05/07/2014    History of Present Illness 58 year old female with history of Legally blind, Marfan's syndrome, Hypertension; Substance abuse; CVA,  (2/08); Fibroid; h/o Small bowel obstruction due to adhesions; Rheumatoid arthritis; and Atrial flutter. Presented to the ED with chief complaint of chest pain/abd pain with nausea and vomiting. status post colectomy/ostomy on 11/28/2012, she also has history of rheumatoid arthritis, and is followed by rheumatologist. Presents with small bowel obstruction. NG tube has been placed and is clamped 11/12.    Clinical Impression   Pt is dependent in bathing, dressing and most IADL at home.  She requires built up surfaces in order to stand and ambulates with a walker.  Pt would like a HHOT to come out and assess equipment needs, specifically her ability to transport items safely with her walker.  Will defer further OT to [facility].    Follow Up Recommendations  Home health OT    Equipment Recommendations       Recommendations for Other Services       Precautions / Restrictions Precautions Precautions: Fall Precaution Comments: Pt is blind Required Braces or Orthoses: Other Brace/Splint Other Brace/Splint: Pt has orthotics in her bedroom shoes that she always wears Restrictions Weight Bearing Restrictions: No      Mobility Bed Mobility         Supine to sit: Min guard Sit to supine: Supervision      Transfers Overall transfer level: Needs assistance Equipment used: Rolling walker (2 wheeled) Transfers: Sit to/from Stand Sit to Stand: Mod assist;Max assist Stand pivot transfers: Min assist       General transfer comment: difficulty rising from low surfaces is baseline for pt    Balance                                            ADL Overall ADL's : At baseline                                        General ADL Comments: Pt with baseline dependence.       Vision                     Perception     Praxis      Pertinent Vitals/Pain Pain Assessment: No/denies pain     Hand Dominance Right   Extremity/Trunk Assessment Upper Extremity Assessment Upper Extremity Assessment: RUE deficits/detail;LUE deficits/detail RUE Deficits / Details: deformities at the hand, limited shoulder and elbow ROM RUE Coordination: decreased fine motor;decreased gross motor LUE Deficits / Details: deformities at the hand, limited shoulder and elbow ROM LUE Coordination: decreased fine motor;decreased gross motor   Lower Extremity Assessment Lower Extremity Assessment: Defer to PT evaluation   Cervical / Trunk Assessment Cervical / Trunk Assessment: Kyphotic   Communication Communication Communication: HOH (B hearing aides)   Cognition Arousal/Alertness: Awake/alert Behavior During Therapy: WFL for tasks assessed/performed Overall Cognitive Status: Within Functional Limits for tasks assessed                     General Comments       Exercises       Shoulder Instructions      Home  Living Family/patient expects to be discharged to:: Private residence Living Arrangements: Alone Available Help at Discharge: Personal care attendant;Family;Available PRN/intermittently Type of Home: Apartment Home Access: Level entry     Home Layout: One level     Bathroom Shower/Tub: Teacher, early years/pre: Standard Bathroom Accessibility: No   Home Equipment: Environmental consultant - 2 wheels;Bedside commode;Shower seat;Wheelchair - manual;Toilet riser (has walker tray that is ineffective)   Additional Comments: Pt lived alone with assitance from her daughter for bathing and dressing as well as to manage colostomy. Pt has personal aid that comes in 2 hours a day to A with household management.      Prior Functioning/Environment Level of Independence: Needs  assistance  Gait / Transfers Assistance Needed: Pt reports using RW in the home. ADL's / Homemaking Assistance Needed: Pt has not been able to assemble her coffee or retrieve a meal lately at home, she could take herself to the bathroom, daughter helped with bathing, dressing, PCS worker performed housekeeping.   Comments: Pt reports daughter also assisted with management of colostomy    OT Diagnosis:     OT Problem List: Decreased range of motion;Impaired vision/perception;Decreased coordination;Decreased knowledge of use of DME or AE;Impaired UE functional use   OT Treatment/Interventions:      OT Goals(Current goals can be found in the care plan section) Acute Rehab OT Goals Patient Stated Goal: go home  OT Frequency:     Barriers to D/C:            Co-evaluation              End of Session Nurse Communication:  (wants coffee)  Activity Tolerance: Patient tolerated treatment well Patient left: in bed;with call bell/phone within reach;with nursing/sitter in room   Time: 0830-0908 OT Time Calculation (min): 38 min Charges:  OT General Charges $OT Visit: 1 Procedure OT Evaluation $Initial OT Evaluation Tier I: 1 Procedure OT Treatments $Self Care/Home Management : 8-22 mins G-Codes:    Malka So 05/07/2014, 9:09 AM  [phone]

## 2014-05-07 NOTE — Plan of Care (Signed)
Problem: Phase II Progression Outcomes Goal: Vital signs remain stable Outcome: Completed/Met Date Met:  05/07/14     

## 2014-05-08 LAB — BASIC METABOLIC PANEL
Anion gap: 14 (ref 5–15)
BUN: 4 mg/dL — ABNORMAL LOW (ref 6–23)
CO2: 21 mEq/L (ref 19–32)
Calcium: 8.9 mg/dL (ref 8.4–10.5)
Chloride: 104 mEq/L (ref 96–112)
Creatinine, Ser: 0.59 mg/dL (ref 0.50–1.10)
GFR calc Af Amer: >90 mL/min (ref >90)
GFR calc non Af Amer: >90 mL/min (ref >90)
Glucose, Bld: 93 mg/dL (ref 70–99)
Potassium: 4 mEq/L (ref 3.7–5.3)
Sodium: 139 mEq/L (ref 137–147)

## 2014-05-08 MED ORDER — PANTOPRAZOLE SODIUM 40 MG PO TBEC
40.0000 mg | DELAYED_RELEASE_TABLET | Freq: Two times a day (BID) | ORAL | Status: DC
  Administered 2014-05-08 – 2014-05-11 (×6): 40 mg via ORAL
  Filled 2014-05-08 (×6): qty 1

## 2014-05-08 NOTE — Plan of Care (Signed)
Problem: Phase I Progression Outcomes Goal: OOB as tolerated unless otherwise ordered Outcome: Progressing Patient assisted to chair yesterday. Patient declined sitting up in chair today because patient reports that chair is too low and patient had a lot of difficulty getting out of chair. Attempts made to get new, higher chair.   Problem: Phase II Progression Outcomes Goal: Progress activity as tolerated unless otherwise ordered Outcome: Progressing Goal: Obtain order to discontinue catheter if appropriate Outcome: Not Applicable Date Met:  88/89/16

## 2014-05-08 NOTE — Progress Notes (Signed)
[facility] PROGRESS NOTE  [name] [id number]:[id number] DOB: 05-24-1956 DOA: 05/01/2014 PCP: [name], MD   Brief Narrative: 58 year old female with history of Legally blind, Marfan's syndrome, Hypertension; Substance abuse; CVA,  (2/08); Fibroid; h/o Small bowel obstruction due to adhesions; Rheumatoid arthritis; and Atrial flutter. Presented to the ED with chief complaint of chest pain/abd pain with nausea and vomiting. CT angiogram of the chest was done which ruled out aortic dissection, CTA of the abdomen pelvis revealed high-grade small bowel obstruction. Patient has colostomy bag in place, she haddiverticulitis of colon with perforation status post colectomy/ostomy on 11/28/2012, she also has history of rheumatoid arthritis, and is followed by rheumatologist. She was started on isoniazid for latent tuberculosis in March of this year. Since admission, seen by [facility], had NG placed for decompression, started improving and hence NG clamped and removed per [facility] recs 11/9, Overnight 11/9 with recurrent vomiting and now NG to be replaced. Also was on Cardizem drip for atrial fibrillation with RVR.   HPI/Subjective: No complaints, ostomy bag full of gas and loose stools. Diet advanced by general surgery.  Assessment/Plan:  Small bowel obstruction -High-grade small bowel obstruction, probably due to adhesions -NG was removed per Surgery 11/9, but with recurrence of symptoms since last pm and hence NG replaced on 11/10 -General surgery is following, patient feels much better today. Has loose stools and gas in the ostomy bag. -Clamp the tube and ambulate, sips of clears.  Essential hypertension -Patient takes amlodipine and metoprolol at home, as she is NPO -blood pressure is reasonable, patient is on Cardizem, metoprolol discontinued.  Afib with RVR -Since 11/8pm, started on Cardizem drip, currently normal sinus rhythm. -H/o A flutter per EMR, h/o ablation/followed by Dr.Taylor,  not on anticoagulation chronically. restart ASA on discharge. -Developed first-degree AV block known Cardizem drip, currently wean off Cardizem. -Cardizem drip discontinued, patient currently on oral Cardizem heart rate is in the 80s. Still with first-degree AV block.  History of latent TB Patient has been taking isoniazid since March 2015, Dr.Lama called and discussed with Dr. Megan Salon from infectious disease, patient does not need isolation. Will hold isoniazid at this time, as patient is nothing by mouth for SBO. Restart isoniazid when patient able to take PO.  Questionable lesion in the left kidney CT abdomen reveals exophytic lesion from the posterior interpolar left kidney, she will need MRI of the abdomen once more stable.  Thyroid nodule -needs workup as outpatient  Rheumatoid arthritis Patient takes methotrexate injection once a week, will continue with Tylenol PRN rectally, as well as IV morphine PRN.  Hyperkalemia -Potassium of 3.0, likely secondary to gastric suction, treated with IV supplements.  GERD IV Protonix for now.  DVT prophylaxis Lovenox  Code status:Patient is full code Family discussion: none at bedside Dispo: when stable  Consultants:  CCS   Objective: Filed Vitals:   05/08/14 1127  BP: 142/84  Pulse:   Temp:   Resp:     Intake/Output Summary (Last 24 hours) at 05/08/14 1213 Last data filed at 05/08/14 0557  Gross per 24 hour  Intake    720 ml  Output   1550 ml  Net   -830 ml   Filed Weights   05/06/14 0430 05/07/14 0428 05/08/14 0550  Weight: 59.966 kg (132 lb 3.2 oz) 59.512 kg (131 lb 3.2 oz) 62.415 kg (137 lb 9.6 oz)    Exam:   General: AAOx3, chronically ill appearing  HEENT: eyes closed, blind  Skeletal deformities noted  Cardiovascular:  S1S2/RRR  Respiratory: CTAB  Abdomen: soft, mildly distended, colostomy with stool, BS heard  Musculoskeletal: no edema c/c, deformities noted   Data Reviewed: Basic Metabolic  Panel:  Recent Labs Lab 05/03/14 0030  05/05/14 0025 05/06/14 0350 05/06/14 1400 05/06/14 1540 05/07/14 0526 05/08/14 0355  NA 142  < > 146 146  --  140 138 139  K 4.0  < > 3.3* 3.0* >7.7* 3.3* 3.9 4.0  CL 107  < > 104 106  --  101 103 104  CO2 21  < > 27 26  --  26 22 21   GLUCOSE 94  < > 88 98  --  104* 97 93  BUN 14  < > 13 7  --  5* 3* 4*  CREATININE 0.67  < > 0.69 0.53  --  0.58 0.53 0.59  CALCIUM 8.5  < > 8.3* 8.4  --  9.1 9.0 8.9  MG 1.7  --   --   --   --   --  1.9  --   < > = values in this interval not displayed. Liver Function Tests:  Recent Labs Lab 05/02/14 0357 05/03/14 0030  AST 27 21  ALT 17 16  ALKPHOS 96 97  BILITOT 0.6 0.8  PROT 6.3 6.2  ALBUMIN 3.2* 3.1*   No results for input(s): LIPASE, AMYLASE in the last 168 hours. No results for input(s): AMMONIA in the last 168 hours. CBC:  Recent Labs Lab 05/03/14 0030 05/04/14 0347 05/05/14 0027 05/06/14 0350 05/07/14 0526  WBC 4.8 5.5 3.8* 4.3 6.9  HGB 11.1* 11.3* 9.8* 9.8* 11.6*  HCT 35.0* 35.3* 30.1* 30.6* 35.2*  MCV 95.1 91.9 92.0 91.9 90.3  PLT 272 270 257 277 304   Cardiac Enzymes:  Recent Labs Lab 05/01/14 1820 05/01/14 2250 05/02/14 0357  TROPONINI <0.30 <0.30 <0.30   BNP (last 3 results)  Recent Labs  12/31/13 1116 01/04/14 0245  PROBNP 6068.0* 3355.0*   CBG: No results for input(s): GLUCAP in the last 168 hours.  No results found for this or any previous visit (from the past 240 hour(s)).   Studies: No results found.  Scheduled Meds: . diltiazem  30 mg Oral 4 times per day  . enoxaparin (LOVENOX) injection  40 mg Subcutaneous Q24H  . pantoprazole  40 mg Oral BID   Continuous Infusions: . dextrose 5 % and 0.9% NaCl 1,000 mL with potassium chloride 20 mEq infusion 75 mL/hr at 05/08/14 0121   Antibiotics Given (last 72 hours)    None      Active Problems:   THYROID NODULE   Blindness   Essential hypertension   Marfan's syndrome   Diverticulitis of colon  with perforation s/p colectomy/ostomy 11/28/2012   Tachycardia   Small bowel obstruction   SBO (small bowel obstruction)   Renal mass    Time spent: 25 min    [facility] Pager [phone]. If 7PM-7AM, please contact night-coverage at [email or web address], password [facility] 05/08/2014, 12:13 PM  LOS: 7 days

## 2014-05-08 NOTE — Progress Notes (Signed)
Patient ID: [name], female   DOB: May 13, 1956, 58 y.o.   MRN: [id number]   LOS: 7 days   Subjective: Feeling better. Denies N/V. +flatus.   Objective: Vital signs in last 24 hours: Temp:  [98 F (36.7 C)-98.4 F (36.9 C)] 98.2 F (36.8 C) (11/14 0800) Pulse Rate:  [79-89] 86 (11/14 0800) Resp:  [16] 16 (11/14 0800) BP: (131-147)/(83-95) 142/84 mmHg (11/14 0800) SpO2:  [100 %] 100 % (11/14 0800) Weight:  [137 lb 9.6 oz (62.415 kg)] 137 lb 9.6 oz (62.415 kg) (11/14 0550) Last BM Date: 05/05/14   Laboratory  BMET  Recent Labs  05/07/14 0526 05/08/14 0355  NA 138 139  K 3.9 4.0  CL 103 104  CO2 22 21  GLUCOSE 97 93  BUN 3* 4*  CREATININE 0.53 0.59  CALCIUM 9.0 8.9    Physical Exam General appearance: alert and no distress Resp: clear to auscultation bilaterally Cardio: regular rate and rhythm GI: Soft, NT, +BS, stool in bag   Assessment/Plan: PSBO -- Advance diet to regular, see how she tolerates.    Lisette Abu, PA-C Pager: [phone] 05/08/2014

## 2014-05-08 NOTE — Progress Notes (Signed)
DR:   Hartford Poli CONCERNING: IV to Oral Route Change Policy  RECOMMENDATION: This patient is receiving Protonix by the intravenous route.  Based on criteria approved by the Pharmacy and Therapeutics Committee, the intravenous medication(s) is/are being converted to the equivalent oral dose form(s).   DESCRIPTION: These criteria include:  The patient is eating (either orally or via tube) and/or has been taking other orally administered medications for a least 24 hours  The patient has no evidence of active gastrointestinal bleeding or impaired GI absorption (gastrectomy, short bowel, patient on TNA or NPO).  Thanks, Theron Arista, PharmD Clinical Pharmacist - Resident Pager: [phone] 11/14/201511:29 AM

## 2014-05-09 LAB — BASIC METABOLIC PANEL
Anion gap: 12 (ref 5–15)
BUN: 6 mg/dL (ref 6–23)
CO2: 23 mEq/L (ref 19–32)
Calcium: 8.5 mg/dL (ref 8.4–10.5)
Chloride: 103 mEq/L (ref 96–112)
Creatinine, Ser: 0.69 mg/dL (ref 0.50–1.10)
GFR calc Af Amer: >90 mL/min (ref >90)
GFR calc non Af Amer: >90 mL/min (ref >90)
Glucose, Bld: 110 mg/dL — ABNORMAL HIGH (ref 70–99)
Potassium: 4.1 mEq/L (ref 3.7–5.3)
Sodium: 138 mEq/L (ref 137–147)

## 2014-05-09 MED ORDER — POLYETHYLENE GLYCOL 3350 17 G PO PACK
17.0000 g | PACK | Freq: Every day | ORAL | Status: DC
  Administered 2014-05-09 – 2014-05-11 (×3): 17 g via ORAL
  Filled 2014-05-09 (×3): qty 1

## 2014-05-09 NOTE — Plan of Care (Signed)
Problem: Phase I Progression Outcomes Goal: OOB as tolerated unless otherwise ordered Outcome: Progressing  Problem: Phase II Progression Outcomes Goal: Progress activity as tolerated unless otherwise ordered Outcome: Progressing Patient up to chair for breakfast this morning.  Goal: IV changed to normal saline lock Outcome: Progressing Patient continues on IVF  Problem: Phase III Progression Outcomes Goal: Foley discontinued Outcome: Not Applicable Date Met:  31/49/70  Problem: Discharge Progression Outcomes Goal: Tolerating diet Outcome: Progressing No episodes of N/V/abdominal discomfort with meals today

## 2014-05-09 NOTE — Plan of Care (Signed)
Problem: Phase III Progression Outcomes Goal: Pain controlled on oral analgesia Outcome: Completed/Met Date Met:  05/09/14

## 2014-05-09 NOTE — Progress Notes (Signed)
[facility] PROGRESS NOTE  [name] DJS:[id number] DOB: 1956-03-23 DOA: 05/01/2014 PCP: [name], MD   Brief Narrative: 58 year old female with history of Legally blind, Marfan's syndrome, Hypertension; Substance abuse; CVA,  (2/08); Fibroid; h/o Small bowel obstruction due to adhesions; Rheumatoid arthritis; and Atrial flutter. Presented to the ED with chief complaint of chest pain/abd pain with nausea and vomiting. CT angiogram of the chest was done which ruled out aortic dissection, CTA of the abdomen pelvis revealed high-grade small bowel obstruction. Patient has colostomy bag in place, she haddiverticulitis of colon with perforation status post colectomy/ostomy on 11/28/2012, she also has history of rheumatoid arthritis, and is followed by rheumatologist. She was started on isoniazid for latent tuberculosis in March of this year. Since admission, seen by [facility], had NG placed for decompression, started improving and hence NG clamped and removed per [facility] recs 11/9, Overnight 11/9 with recurrent vomiting and now NG to be replaced. Also was on Cardizem drip for atrial fibrillation with RVR.   HPI/Subjective: Had some bloating after she ate yesterday, but doing okay now without nausea or vomiting. Decrease output in the colostomy bag, has more air than stools output.  Assessment/Plan:  Small bowel obstruction -High-grade small bowel obstruction, probably due to adhesions -NG was removed per Surgery 11/9, but with recurrence of symptoms since last pm and hence NG replaced on 11/10 -General surgery is following, patient feels much better today. Has loose stools and gas in the ostomy bag. -Diet advanced to regular since yesterday, had some bloating but no nausea or vomiting. -Decreased stool output since yesterday but has a lot of of air and her ostomy bag. MiraLAX added.  Essential hypertension -Patient takes amlodipine and metoprolol at home, as she is NPO -blood pressure is  reasonable, patient is on Cardizem, metoprolol discontinued.  Afib with RVR -Since 11/8pm, started on Cardizem drip, currently normal sinus rhythm. -H/o A flutter per EMR, h/o ablation/followed by Dr.Taylor, not on anticoagulation chronically. restart ASA on discharge. -Developed first-degree AV block known Cardizem drip, currently wean off Cardizem. -Cardizem drip discontinued, patient currently on oral Cardizem heart rate is in the 80s. Still with first-degree AV block.  History of latent TB Patient has been taking isoniazid since March 2015, Dr.Lama called and discussed with Dr. Megan Salon from infectious disease, patient does not need isolation. Will hold isoniazid at this time, as patient is nothing by mouth for SBO. Restart isoniazid when patient able to take PO.  Questionable lesion in the left kidney CT abdomen reveals exophytic lesion from the posterior interpolar left kidney, she will need MRI of the abdomen once more stable.  Thyroid nodule -needs workup as outpatient  Rheumatoid arthritis Patient takes methotrexate injection once a week, will continue with Tylenol PRN rectally, as well as IV morphine PRN.  Hyperkalemia -Potassium of 3.0, likely secondary to gastric suction, treated with IV supplements.  GERD IV Protonix for now.  DVT prophylaxis Lovenox  Code status:Patient is full code Family discussion: none at bedside Dispo: when stable  Consultants:  CCS   Objective: Filed Vitals:   05/09/14 0400  BP: 147/88  Pulse: 84  Temp: 98 F (36.7 C)  Resp: 12    Intake/Output Summary (Last 24 hours) at 05/09/14 0950 Last data filed at 05/09/14 0800  Gross per 24 hour  Intake    480 ml  Output   1351 ml  Net   -871 ml   Filed Weights   05/07/14 0428 05/08/14 0550 05/09/14 0400  Weight: 59.512  kg (131 lb 3.2 oz) 62.415 kg (137 lb 9.6 oz) 60.601 kg (133 lb 9.6 oz)    Exam:   General: AAOx3, chronically ill appearing  HEENT: eyes closed,  blind  Skeletal deformities noted  Cardiovascular: S1S2/RRR  Respiratory: CTAB  Abdomen: soft, mildly distended, colostomy with stool, BS heard  Musculoskeletal: no edema c/c, deformities noted   Data Reviewed: Basic Metabolic Panel:  Recent Labs Lab 05/03/14 0030  05/06/14 0350 05/06/14 1400 05/06/14 1540 05/07/14 0526 05/08/14 0355 05/09/14 0400  NA 142  < > 146  --  140 138 139 138  K 4.0  < > 3.0* >7.7* 3.3* 3.9 4.0 4.1  CL 107  < > 106  --  101 103 104 103  CO2 21  < > 26  --  26 22 21 23   GLUCOSE 94  < > 98  --  104* 97 93 110*  BUN 14  < > 7  --  5* 3* 4* 6  CREATININE 0.67  < > 0.53  --  0.58 0.53 0.59 0.69  CALCIUM 8.5  < > 8.4  --  9.1 9.0 8.9 8.5  MG 1.7  --   --   --   --  1.9  --   --   < > = values in this interval not displayed. Liver Function Tests:  Recent Labs Lab 05/03/14 0030  AST 21  ALT 16  ALKPHOS 97  BILITOT 0.8  PROT 6.2  ALBUMIN 3.1*   No results for input(s): LIPASE, AMYLASE in the last 168 hours. No results for input(s): AMMONIA in the last 168 hours. CBC:  Recent Labs Lab 05/03/14 0030 05/04/14 0347 05/05/14 0027 05/06/14 0350 05/07/14 0526  WBC 4.8 5.5 3.8* 4.3 6.9  HGB 11.1* 11.3* 9.8* 9.8* 11.6*  HCT 35.0* 35.3* 30.1* 30.6* 35.2*  MCV 95.1 91.9 92.0 91.9 90.3  PLT 272 270 257 277 304   Cardiac Enzymes: No results for input(s): CKTOTAL, CKMB, CKMBINDEX, TROPONINI in the last 168 hours. BNP (last 3 results)  Recent Labs  12/31/13 1116 01/04/14 0245  PROBNP 6068.0* 3355.0*   CBG: No results for input(s): GLUCAP in the last 168 hours.  No results found for this or any previous visit (from the past 240 hour(s)).   Studies: No results found.  Scheduled Meds: . diltiazem  30 mg Oral 4 times per day  . enoxaparin (LOVENOX) injection  40 mg Subcutaneous Q24H  . pantoprazole  40 mg Oral BID  . polyethylene glycol  17 g Oral Daily   Continuous Infusions: . dextrose 5 % and 0.9% NaCl 1,000 mL with potassium  chloride 20 mEq infusion 50 mL/hr at 05/08/14 1415   Antibiotics Given (last 72 hours)    None      Active Problems:   THYROID NODULE   Blindness   Essential hypertension   Marfan's syndrome   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 11/28/2012   Tachycardia   Small bowel obstruction   SBO (small bowel obstruction)   Renal mass    Time spent: 25 min    [facility] Pager [phone]. If 7PM-7AM, please contact night-coverage at [email or web address], password [facility] 05/09/2014, 9:50 AM  LOS: 8 days

## 2014-05-09 NOTE — Plan of Care (Signed)
Problem: Phase II Progression Outcomes Goal: Progress activity as tolerated unless otherwise ordered Outcome: Completed/Met Date Met:  05/09/14 Goal: Other Phase II Outcomes/Goals Outcome: Completed/Met Date Met:  05/09/14

## 2014-05-09 NOTE — Progress Notes (Signed)
  Subjective: Some nausea with some morphine,  Bloating after regular food last night  Objective: Vital signs in last 24 hours: Temp:  [97.9 F (36.6 C)-98.5 F (36.9 C)] 98 F (36.7 C) (11/15 0400) Pulse Rate:  [84-100] 84 (11/15 0400) Resp:  [12-19] 12 (11/15 0400) BP: (111-147)/(69-92) 147/88 mmHg (11/15 0400) SpO2:  [98 %-100 %] 98 % (11/15 0400) Weight:  [133 lb 9.6 oz (60.601 kg)] 133 lb 9.6 oz (60.601 kg) (11/15 0400) Last BM Date: 05/08/14  Intake/Output from previous day: 11/14 0701 - 11/15 0700 In: 480 [P.O.:480] Out: 1051 [Urine:900; Stool:151] Intake/Output this shift: Total I/O In: -  Out: 600 [Urine:600]  GI: soft, reducible hernia, stoma functional with stool and some air, bs present, nontender  Lab Results:   Recent Labs  05/07/14 0526  WBC 6.9  HGB 11.6*  HCT 35.2*  PLT 304   BMET  Recent Labs  05/08/14 0355 05/09/14 0400  NA 139 138  K 4.0 4.1  CL 104 103  CO2 21 23  GLUCOSE 93 110*  BUN 4* 6  CREATININE 0.59 0.69  CALCIUM 8.9 8.5   PT/INR No results for input(s): LABPROT, INR in the last 72 hours. ABG No results for input(s): PHART, HCO3 in the last 72 hours.  Invalid input(s): PCO2, PO2  Studies/Results: No results found.  Anti-infectives: Anti-infectives    None      Assessment/Plan: Resolving psbo  Can continue regular diet, dont think she will need surgery this admission, is at risk of recurrence  [facility] 05/09/2014

## 2014-05-10 ENCOUNTER — Inpatient Hospital Stay (HOSPITAL_COMMUNITY): Payer: Medicare Other

## 2014-05-10 NOTE — Progress Notes (Signed)
PT Cancellation Note  Patient Details Name: [name] MRN: [id number] DOB: 1956/06/06   Cancelled Treatment:    Reason Eval/Treat Not Completed:  (refused PT as she was waiting on pain meds).  Will check back.  Thanks.   Irwin Brakeman F 05/10/2014, 11:32 AM  [facility] [phone] (pager)

## 2014-05-10 NOTE — Progress Notes (Signed)
[facility] PROGRESS NOTE  [name] IWL:[id number] DOB: 29-Jun-1955 DOA: 05/01/2014 PCP: [name], MD   Brief Narrative: 58 year old female with history of Legally blind, Marfan's syndrome, Hypertension; Substance abuse; CVA,  (2/08); Fibroid; h/o Small bowel obstruction due to adhesions; Rheumatoid arthritis; and Atrial flutter. Presented to the ED with chief complaint of chest pain/abd pain with nausea and vomiting. CT angiogram of the chest was done which ruled out aortic dissection, CTA of the abdomen pelvis revealed high-grade small bowel obstruction. Patient has colostomy bag in place, she haddiverticulitis of colon with perforation status post colectomy/ostomy on 11/28/2012, she also has history of rheumatoid arthritis, and is followed by rheumatologist. She was started on isoniazid for latent tuberculosis in March of this year. Since admission, seen by [facility], had NG placed for decompression, started improving and hence NG clamped and removed per [facility] recs 11/9, Overnight 11/9 with recurrent vomiting and now NG to be replaced. Also was on Cardizem drip for atrial fibrillation with RVR.   HPI/Subjective: Ostomy bag has only gas and no stools. Patient reported no nausea or vomiting with regular diet.  Assessment/Plan:  Small bowel obstruction -High-grade small bowel obstruction, probably due to adhesions -NG was removed per Surgery 11/9, but with recurrence of symptoms since last pm and hence NG replaced on 11/10 -General surgery is following, patient feels much better today. Has loose stools and gas in the ostomy bag. -Diet advanced to regular since yesterday, had some bloating but no nausea or vomiting. -Bloating and more gas output that his stools since diet started, per general surgery.  Essential hypertension -Patient takes amlodipine and metoprolol at home, as she is NPO -blood pressure is reasonable, patient is on Cardizem, metoprolol discontinued.  Afib with  RVR -Since 11/8pm, started on Cardizem drip, currently normal sinus rhythm. -H/o A flutter per EMR, h/o ablation/followed by Dr.Taylor, not on anticoagulation chronically. restart ASA on discharge. -Developed first-degree AV block known Cardizem drip, currently wean off Cardizem. -Cardizem drip discontinued, patient currently on oral Cardizem heart rate is in the 80s. Still with first-degree AV block.  History of latent TB Patient has been taking isoniazid since March 2015, Dr.Lama called and discussed with Dr. Megan Salon from infectious disease, patient does not need isolation. Will hold isoniazid at this time, as patient is nothing by mouth for SBO. Restart isoniazid when patient able to take PO.  Questionable lesion in the left kidney CT abdomen reveals exophytic lesion from the posterior interpolar left kidney, she will need MRI of the abdomen once more stable.  Thyroid nodule -needs workup as outpatient  Rheumatoid arthritis Patient takes methotrexate injection once a week, will continue with Tylenol PRN rectally, as well as IV morphine PRN.  Hyperkalemia -Potassium of 3.0, likely secondary to gastric suction, treated with IV supplements.  GERD IV Protonix for now.  DVT prophylaxis Lovenox  Code status:Patient is full code Family discussion: none at bedside Dispo: when stable  Consultants:  CCS   Objective: Filed Vitals:   05/10/14 0623  BP: 169/92  Pulse:   Temp:   Resp:     Intake/Output Summary (Last 24 hours) at 05/10/14 1325 Last data filed at 05/10/14 1100  Gross per 24 hour  Intake 8175.42 ml  Output   2500 ml  Net 5675.42 ml   Filed Weights   05/08/14 0550 05/09/14 0400 05/10/14 0400  Weight: 62.415 kg (137 lb 9.6 oz) 60.601 kg (133 lb 9.6 oz) 62.007 kg (136 lb 11.2 oz)    Exam:  General: AAOx3, chronically ill appearing  HEENT: eyes closed, blind  Skeletal deformities noted  Cardiovascular: S1S2/RRR  Respiratory: CTAB  Abdomen: soft,  mildly distended, colostomy with stool, BS heard  Musculoskeletal: no edema c/c, deformities noted   Data Reviewed: Basic Metabolic Panel:  Recent Labs Lab 05/06/14 0350 05/06/14 1400 05/06/14 1540 05/07/14 0526 05/08/14 0355 05/09/14 0400  NA 146  --  140 138 139 138  K 3.0* >7.7* 3.3* 3.9 4.0 4.1  CL 106  --  101 103 104 103  CO2 26  --  26 22 21 23   GLUCOSE 98  --  104* 97 93 110*  BUN 7  --  5* 3* 4* 6  CREATININE 0.53  --  0.58 0.53 0.59 0.69  CALCIUM 8.4  --  9.1 9.0 8.9 8.5  MG  --   --   --  1.9  --   --    Liver Function Tests: No results for input(s): AST, ALT, ALKPHOS, BILITOT, PROT, ALBUMIN in the last 168 hours. No results for input(s): LIPASE, AMYLASE in the last 168 hours. No results for input(s): AMMONIA in the last 168 hours. CBC:  Recent Labs Lab 05/04/14 0347 05/05/14 0027 05/06/14 0350 05/07/14 0526  WBC 5.5 3.8* 4.3 6.9  HGB 11.3* 9.8* 9.8* 11.6*  HCT 35.3* 30.1* 30.6* 35.2*  MCV 91.9 92.0 91.9 90.3  PLT 270 257 277 304   Cardiac Enzymes: No results for input(s): CKTOTAL, CKMB, CKMBINDEX, TROPONINI in the last 168 hours. BNP (last 3 results)  Recent Labs  12/31/13 1116 01/04/14 0245  PROBNP 6068.0* 3355.0*   CBG: No results for input(s): GLUCAP in the last 168 hours.  No results found for this or any previous visit (from the past 240 hour(s)).   Studies: Dg Abd 2 Views  05/10/2014   CLINICAL DATA:  Colostomy patient with considerable bowel gas without pain  EXAM: ABDOMEN - 2 VIEW  COMPARISON:  Abdominal series of May 06, 2014  FINDINGS: The distended gas-filled small bowel loop in the left lower quadrant of the abdomen is less conspicuous today. There is colonic gas. No extraluminal gas collections are demonstrated. There is severe thoracolumbar scoliosis.  IMPRESSION: There is decreased small bowel distention consistent with an improvement in the obstructive pattern.   Electronically Signed   By: David  Martinique   On: 05/10/2014  11:58    Scheduled Meds: . diltiazem  30 mg Oral 4 times per day  . enoxaparin (LOVENOX) injection  40 mg Subcutaneous Q24H  . pantoprazole  40 mg Oral BID  . polyethylene glycol  17 g Oral Daily   Continuous Infusions: . dextrose 5 % and 0.9% NaCl 1,000 mL with potassium chloride 20 mEq infusion 50 mL/hr at 05/10/14 0430   Antibiotics Given (last 72 hours)    None      Active Problems:   THYROID NODULE   Blindness   Essential hypertension   Marfan's syndrome   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 11/28/2012   Tachycardia   Small bowel obstruction   SBO (small bowel obstruction)   Renal mass    Time spent: 25 min    [place] [facility] Pager [phone]. If 7PM-7AM, please contact night-coverage at [email or web address], password [facility] 05/10/2014, 1:25 PM  LOS: 9 days

## 2014-05-10 NOTE — Progress Notes (Signed)
Medicare Important Message given? YES   (If response is "NO", the following Medicare IM given date fields will be blank)   Date Medicare IM given:   Medicare IM given by: Graves-Bigelow, Cleatus Goodin  

## 2014-05-10 NOTE — Progress Notes (Signed)
  Subjective: Patient tolerating regular diet without nausea or vomiting, but does report a lot of gas in abdomen  Nurse reports much flatus, not much BM in ostomy bag.  Volume of ostomy output not recorded  Objective: Vital signs in last 24 hours: Temp:  [98 F (36.7 C)-98.7 F (37.1 C)] 98 F (36.7 C) (11/16 0400) Pulse Rate:  [88-91] 88 (11/16 0400) Resp:  [14-18] 18 (11/16 0400) BP: (129-169)/(73-96) 169/92 mmHg (11/16 0623) SpO2:  [100 %] 100 % (11/16 0400) Weight:  [136 lb 11.2 oz (62.007 kg)] 136 lb 11.2 oz (62.007 kg) (11/16 0400) Last BM Date: 05/08/14  Intake/Output from previous day: 11/15 0701 - 11/16 0700 In: 8415.4 [P.O.:1020; I.V.:7395.4] Out: 2900 [Urine:2900] Intake/Output this shift:    General appearance: alert, cooperative and no distress GI: Ostomy with minimal stool in bag Abdomen soft, mildly distended; hernia soft and reducible Lab Results:  No results for input(s): WBC, HGB, HCT, PLT in the last 72 hours. BMET  Recent Labs  05/08/14 0355 05/09/14 0400  NA 139 138  K 4.0 4.1  CL 104 103  CO2 21 23  GLUCOSE 93 110*  BUN 4* 6  CREATININE 0.59 0.69  CALCIUM 8.9 8.5   PT/INR No results for input(s): LABPROT, INR in the last 72 hours. ABG No results for input(s): PHART, HCO3 in the last 72 hours.  Invalid input(s): PCO2, PO2  Studies/Results: No results found.  Anti-infectives: Anti-infectives    None      Assessment/Plan: s/p * No surgery found * PSBO - seems to be resolved, although she has significant bloating  Will check plain films today Will attempt to get her through this without needing urgent surgery.  Hopefully she can get through this episode and can see Dr. Johney Maine as an outpatient to consider ostomy reversal and hernia repair.  Will be complicated by her Marfan's.  Plain films today Elastic abdominal binder.   LOS: 9 days    Shaiann Mcmanamon K. 05/10/2014

## 2014-05-11 MED ORDER — POLYETHYLENE GLYCOL 3350 17 G PO PACK: 17.0000 g | PACK | Freq: Every day | ORAL | Status: DC

## 2014-05-11 MED ORDER — ASPIRIN EC 81 MG PO TBEC: 81.0000 mg | DELAYED_RELEASE_TABLET | Freq: Every day | ORAL | Status: AC

## 2014-05-11 NOTE — Progress Notes (Signed)
Pt discharged home with daughter.  Reviewed discharge instructions and education, all questions answered.  Assessment unchanged from earlier.  

## 2014-05-11 NOTE — Progress Notes (Signed)
Patient ID: [name], female   DOB: December 16, 1955, 58 y.o.   MRN: [id number]    Subjective: Pt feels well today.  No problems with eating.  Ostomy full of flatus.  No pain  Objective: Vital signs in last 24 hours: Temp:  [98.7 F (37.1 C)-99.3 F (37.4 C)] 98.7 F (37.1 C) (11/17 0525) Pulse Rate:  [70-89] 87 (11/17 0525) Resp:  [16] 16 10-Jun-2023 2100) BP: (117-152)/(66-97) 152/97 mmHg (11/17 0525) SpO2:  [98 %-100 %] 100 % (11/17 0525) Weight:  [134 lb 4.8 oz (60.918 kg)] 134 lb 4.8 oz (60.918 kg) (11/17 0525) Last BM Date: 06/09/2014  Intake/Output from previous day: 06/10/23 0701 - 11/17 0700 In: 1781.7 [P.O.:780; I.V.:1001.7] Out: 2551 [Urine:2550; Stool:1] Intake/Output this shift:    PE: Abd: soft, NT, ND, +BS, hernia present and soft, ostomy full of air.  Lab Results:  No results for input(s): WBC, HGB, HCT, PLT in the last 72 hours. BMET  Recent Labs  05/09/14 0400  NA 138  K 4.1  CL 103  CO2 23  GLUCOSE 110*  BUN 6  CREATININE 0.69  CALCIUM 8.5   PT/INR No results for input(s): LABPROT, INR in the last 72 hours. CMP     Component Value Date/Time   NA 138 05/09/2014 0400   K 4.1 05/09/2014 0400   CL 103 05/09/2014 0400   CO2 23 05/09/2014 0400   GLUCOSE 110* 05/09/2014 0400   BUN 6 05/09/2014 0400   CREATININE 0.69 05/09/2014 0400   CALCIUM 8.5 05/09/2014 0400   PROT 6.2 05/03/2014 0030   ALBUMIN 3.1* 05/03/2014 0030   AST 21 05/03/2014 0030   ALT 16 05/03/2014 0030   ALKPHOS 97 05/03/2014 0030   BILITOT 0.8 05/03/2014 0030   GFRNONAA >90 05/09/2014 0400   GFRAA >90 05/09/2014 0400   Lipase     Component Value Date/Time   LIPASE 71* 02/25/2013 2121       Studies/Results: Dg Abd 2 Views  09-Jun-2014   CLINICAL DATA:  Colostomy patient with considerable bowel gas without pain  EXAM: ABDOMEN - 2 VIEW  COMPARISON:  Abdominal series of May 06, 2014  FINDINGS: The distended gas-filled small bowel loop in the left lower quadrant of the  abdomen is less conspicuous today. There is colonic gas. No extraluminal gas collections are demonstrated. There is severe thoracolumbar scoliosis.  IMPRESSION: There is decreased small bowel distention consistent with an improvement in the obstructive pattern.   Electronically Signed   By: David  Martinique   On: 06-09-2014 11:58    Anti-infectives: Anti-infectives    None       Assessment/Plan  1. SBO, resolved 2. Incisional hernia, reducible 3. Marfan's  4. Blind  Plan: 1. Patient has resolved her bowel obstruction and seems to be eating well.  Her abdominal films from yesterday were normal.  She is surgically stable for dc home from our standpoint.  She should follow up with Dr. Johney Maine electively if she would like to discuss hernia repair.   LOS: 10 days    Latessa Tillis E 05/11/2014, 8:07 AM Pager: [phone]

## 2014-05-11 NOTE — Progress Notes (Signed)
RN attempted to empty colostomy for patient due to small amount of stool in colostomy bag.  Patient refused stating that the MD wanted to see stool for themselves today.

## 2014-05-11 NOTE — Discharge Summary (Signed)
Physician Discharge Summary  [name] TDD:[id number] DOB: Jan 05, 1956 DOA: 05/01/2014  PCP: [name], MD  Admit date: 05/01/2014 Discharge date: 05/11/2014  Time spent: 40 minutes  Recommendations for Outpatient Follow-up:  1. Follow-up with primary care physician within one week. 2. Follow-up with Dr. Johney [place] of [place] [facility] in about 2 weeks  Discharge Diagnoses:  Active Problems:   THYROID NODULE   Blindness   Essential hypertension   Marfan's syndrome   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 11/28/2012   Tachycardia   Small bowel obstruction   SBO (small bowel obstruction)   Renal mass   Discharge Condition: stable  Diet recommendation: heart healthy  Filed Weights   05/09/14 0400 05/10/14 0400 05/11/14 0525  Weight: 60.601 kg (133 lb 9.6 oz) 62.007 kg (136 lb 11.2 oz) 60.918 kg (134 lb 4.8 oz)    History of present illness:  58 year old female who  has a past medical history of Legally blind; Hearing aid worn; Marfan's syndrome affecting skin; Osteoporosis; Hypertension; Substance abuse; Hernia (4/08); 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 Came to the ED with chief complaint of chest pain which started this morning. Patient also had some shortness of breath, had nausea but no vomiting. In the ED she was found to be in sinus tachycardia. Patient has a history of Marfan syndrome, CT angiogram of the chest was done which ruled out aortic dissection, CTA of the abdomen pelvis revealed high-grade small bowel obstruction. Patient has colostomy bag in place, she haddiverticulitis of colon with perforation status post colectomy/ostomy on 11/28/2012. Patient also has history of rheumatoid arthritis,  and is followed by rheumatologist. She was started on isoniazid for latent tuberculosis in March of this year. She denies fever, no dysuria.  Patient lives by herself, her daughter helps patient in emptying the colostomy everyday.  Hospital Course:   Small bowel obstruction -High-grade small bowel obstruction, probably due to adhesions -NG was removed per Surgery 11/9, but with recurrence of symptoms since last pm and hence NG replaced on 11/10 -General surgery is following, patient feels much better today. Has loose stools and gas in the ostomy bag. -Diet advanced to regular since 11/14, had some bloating but no nausea or vomiting. -not much of output in her ostomy bag since diet started, but no nausea or vomiting. -The day prior to discharge x-rays were repeated and showed resolving SBO. -Patient tolerating diet for the past 3 days without any nausea or vomiting, general surgery okayed her discharge.  Essential hypertension -Patient takes amlodipine and Toprol-XL at home, both were held initially because she was nothing by mouth. -blood pressure is reasonable, restarted home medications on discharge.  Afib with RVR -Since 11/8pm, started on Cardizem drip, currently normal sinus rhythm. -H/o A flutter per EMR, h/o ablation/followed by Dr.Taylor, not on anticoagulation chronically.  -Developed first-degree AV block known Cardizem drip, currently wean off Cardizem. -Cardizem drip discontinued, patient currently on oral Cardizem heart rate is in the 80s. Still with first-degree AV block. -Toprol-XL was restarted at time of discharge, placed on low-dose aspirin.  History of latent TB Patient has been taking isoniazid since March 2015, Dr. Darrick Meigs called and discussed with Dr. Megan Salon from infectious disease, patient does not need isolation. Will hold isoniazid at this time, as patient is nothing by mouth for SBO. Restart  isoniazid when patient able to take PO.  Questionable lesion in the  left kidney CT abdomen reveals exophytic lesion from the posterior interpolar left kidney, she will need MRI of the abdomen once more stable.  Thyroid nodule -needs workup as outpatient  Rheumatoid arthritis Patient takes methotrexate injection once a week, will continue with Tylenol PRN rectally, as well as IV morphine PRN.  Hyperkalemia -potassium dropped to 3.0, likely secondary to gastric suction, treated with IV supplements.  GERD -PBI continued.  Procedures:  none  Consultations:  General surgery  Discharge Exam: Filed Vitals:   05/11/14 0843  BP: 145/64  Pulse: 54  Temp: 98.9 F (37.2 C)  Resp: 16   General: Alert and awake, oriented x3, not in any acute distress. HEENT: anicteric sclera, pupils reactive to light and accommodation, EOMI CVS: S1-S2 clear, no murmur rubs or gallops Chest: clear to auscultation bilaterally, no wheezing, rales or rhonchi Abdomen: soft nontender, nondistended, normal bowel sounds, no organomegaly Extremities: no cyanosis, clubbing or edema noted bilaterally Neuro: Cranial nerves II-XII intact, no focal neurological deficits  Discharge Instructions You were cared for by a hospitalist during your hospital stay. If you have any questions about your discharge medications or the care you received while you were in the hospital after you are discharged, you can call the unit and asked to speak with the hospitalist on call if the hospitalist that took care of you is not available. Once you are discharged, your primary care physician will handle any further medical issues. Please note that NO REFILLS for any discharge medications will be authorized once you are discharged, as it is imperative that you return to your primary care physician (or establish a relationship with a primary care physician if you do not have one) for your aftercare needs so that they can reassess your need for medications and monitor your lab values.  Discharge  Instructions    Diet - low sodium heart healthy    Complete by:  As directed      Increase activity slowly    Complete by:  As directed           Current Discharge Medication List    START taking these medications   Details  aspirin EC 81 MG tablet Take 1 tablet (81 mg total) by mouth daily.    polyethylene glycol (MIRALAX / GLYCOLAX) packet Take 17 g by mouth daily. Qty: 14 each, Refills: 0      CONTINUE these medications which have NOT CHANGED   Details  albuterol (PROVENTIL HFA;VENTOLIN HFA) 108 (90 BASE) MCG/ACT inhaler Inhale 1 puff into the lungs every 4 (four) hours as needed for wheezing or shortness of breath. Qty: 1 Inhaler, Refills: 11    allopurinol (ZYLOPRIM) 100 MG tablet Take 100 mg by mouth daily.    amLODipine (NORVASC) 10 MG tablet take 1 tablet by mouth once daily Qty: 30 tablet, Refills: 3    Calcium Carbonate-Vitamin D (CALCIUM + D PO) Take 1,200 mg by mouth daily.    cetirizine (ZYRTEC) 10 MG tablet Take 10 mg by mouth daily.    Cyanocobalamin (VITAMIN B 12 PO) Take 1 tablet by mouth daily.    cyclobenzaprine (FLEXERIL) 10 MG tablet Take 1 tablet (10 mg total) by mouth 3 (three) times daily as needed for muscle spasms. Qty: 270 tablet, Refills: 3    EPINEPHrine (EPIPEN 2-PAK) 0.3 mg/0.3 mL IJ SOAJ injection Inject 0.3 mLs (0.3 mg total) into the muscle once. Qty: 2 Device, Refills:  5    folic acid (FOLVITE) 1 MG tablet Take 1 mg by mouth daily.    isoniazid (NYDRAZID) 300 MG tablet Take 300 mg by mouth daily.    ketoconazole (NIZORAL) 2 % cream Apply 1 application topically 2 (two) times daily. Qty: 30 g, Refills: 5    megestrol (MEGACE ES) 625 MG/5ML suspension Take 5 mL (625 mg total) by mouth 2 (two) times daily before a meal. Qty: 150 mL, Refills: 5    methotrexate 25 MG/ML injection Inject 20 mg into the skin once a week. Take on Mondays    Multiple Vitamins-Minerals (MULTIVITAMIN PO) Take 1 tablet by mouth daily.     NEXIUM 40 MG  capsule take 1 capsule by mouth twice a day Qty: 60 capsule, Refills: 3    pyridOXINE (VITAMIN B-6) 50 MG tablet Take 50 mg by mouth daily.    risedronate (ACTONEL) 150 MG tablet Take 150 mg by mouth every 30 (thirty) days. with water on empty stomach, nothing by mouth or lie down for next 30 minutes.    vitamin C (ASCORBIC ACID) 500 MG tablet Take 500 mg by mouth daily.    HYDROcodone-acetaminophen (NORCO) 10-325 MG per tablet Take 1 tablet by mouth every 6 (six) hours as needed for moderate pain. Qty: 120 tablet, Refills: 0    metoprolol succinate (TOPROL-XL) 25 MG 24 hr tablet    Associated Diagnoses: Atrial flutter, unspecified    traMADol (ULTRAM) 50 MG tablet take 1 to 2 tablets by mouth every 6 hours if needed for pain Qty: 120 tablet, Refills: 5       No Known Allergies Follow-up Information    Follow up with [facility]   Why:  Registered Nurse and Physical Therapy   Contact information:   [address] [place] [address] [phone]       Follow up with Adin Hector., MD.   Specialty:  General Surgery   Why:  As needed for hernia repair   Contact information:   [place] [address] [phone]       Follow up with [name], MD In 1 week.   Specialty:  Family Medicine   Contact information:   [place] [address] [phone]        The results of significant diagnostics from this hospitalization (including imaging, microbiology, ancillary and laboratory) are listed below for reference.    Significant Diagnostic Studies: Dg Abd 1 View  05/04/2014   CLINICAL DATA:  Nasogastric tube placement under fluoroscopy.  EXAM: ABDOMEN - 1 VIEW  COMPARISON:  None.  FINDINGS: Single portable image shows a nasogastric tube with its tip in the mid to distal stomach.  IMPRESSION: NG tube tip projects in the mid to distal stomach.   Electronically Signed   By: Lajean Manes M.D.    On: 05/04/2014 12:09   Dg Abd 1 View  05/02/2014   CLINICAL DATA:  NG tube placement under fluoroscopy, small bowel obstruction  EXAM: ABDOMEN - 1 VIEW  COMPARISON:  None.  FINDINGS: A single postprocedural image demonstrates nasogastric tube tip terminating over the gastric cardia.  IMPRESSION: Post fluoroscopic placement of nasogastric tube with tip over the gastric cardia. Tube is ready for use.   Electronically Signed   By: Conchita Paris M.D.   On: 05/02/2014 14:29   Dg Abd 2 Views  05/10/2014   CLINICAL DATA:  Colostomy patient with considerable bowel gas without pain  EXAM: ABDOMEN - 2 VIEW  COMPARISON:  Abdominal series of May 06, 2014  FINDINGS: The distended gas-filled small bowel loop in the left lower quadrant of the abdomen is less conspicuous today. There is colonic gas. No extraluminal gas collections are demonstrated. There is severe thoracolumbar scoliosis.  IMPRESSION: There is decreased small bowel distention consistent with an improvement in the obstructive pattern.   Electronically Signed   By: David  Martinique   On: 05/10/2014 11:58   Dg Abd 2 Views  05/06/2014   CLINICAL DATA:  Abdominal pain, small bowel obstruction, nasogastric tube  EXAM: ABDOMEN - 2 VIEW  COMPARISON:  05/05/2014  FINDINGS: Tip of nasogastric tube projects over distal gastric antrum.  Persistent dilatation of a few small bowel loops in the abdomen compatible with small bowel obstruction, loops slightly increased in size since previous exam.  Ostomy LEFT mid abdomen.  No definite bowel wall thickening or free intraperitoneal air.  Small amount colonic gas noted.  Bones demineralized with marked levoconvex thoracolumbar scoliosis and extensive degenerative disc/ facet disease changes of the thoracolumbar spine.  IMPRESSION: Persistent small bowel obstruction.   Electronically Signed   By: Lavonia Dana M.D.   On: 05/06/2014 08:03   Dg Abd 2 Views  05/05/2014   CLINICAL DATA:  History of partial small bowel  obstruction.  EXAM: ABDOMEN - 2 VIEW  COMPARISON:  Abdominal films of May 02, 2014  FINDINGS: The bowel gas pattern is nonspecific. There is no evidence of ileus or obstruction. The previously noted mildly distended loop of small bowel overlying the left iliac crest is less conspicuous today. The nasogastric tube tip in proximal port lie in the gastric body with the tip near the pylorus. There is severe levo scoliosis of the thoracic spine and bilateral osteoarthritic change of the hips.  IMPRESSION: There is no evidence of significant small-bowel obstruction currently. Minimal distention of a small bowel loop in the left lower quadrant of the abdomen is demonstrated but improving.   Electronically Signed   By: David  Martinique   On: 05/05/2014 08:18   Dg Abd 2 Views  05/02/2014   CLINICAL DATA:  Abdominal pain, small bowel obstruction  EXAM: ABDOMEN - 2 VIEW  COMPARISON:  CT 05/01/2014  FINDINGS: No free air. Slight decrease in caliber of dilated small bowel loops, now measuring 3.7 cm maximally over the mid low abdomen. Retained contrast within the bladder. Severe leftward scoliosis and multilevel degenerative change noted with severe right hip degenerative change.  IMPRESSION: Improving gaseous distention of loops of small bowel compatible with small bowel obstruction.   Electronically Signed   By: Conchita Paris M.D.   On: 05/02/2014 08:22   Dg Abd Acute W/chest  05/04/2014   CLINICAL DATA:  Vomiting.  EXAM: ACUTE ABDOMEN SERIES (ABDOMEN 2 VIEW & CHEST 1 VIEW)  COMPARISON:  05/02/2014.  CT 05/01/2014 .  FINDINGS: Chest x-ray reveals no acute cardiopulmonary disease. Mild cardiomegaly. No CHF. Severe degenerative deformities both shoulders. Severe thoracic spine scoliosis. Soft tissue structures are unremarkable. Persistent distention of small bowel noted without interim change. Small bowel distention up to 3.7 cm noted. Colonic gas pattern is nonspecific. No free air. Aortoiliac atherosclerotic vascular  disease. Calcifications within the pelvis most consistent with phleboliths. Soft tissue calcifications over the lower extremities. Severe lumbar spine degenerative change concave right. Severe lumbar spine and bilateral hip degenerative change.  IMPRESSION: Persistent small bowel distention without interim change from prior exam.   Electronically Signed   By: Marcello Moores  Register   On: 05/04/2014 08:43   Dg Loyce Dys Tube Plc W/fl-no Rad  05/04/2014   CLINICAL DATA:    NASO G TUBE PLACEMENT WITH FLUORO  Fluoroscopy was utilized by the requesting physician.  No radiographic  interpretation.    Dg Naso G Tube Plc W/fl-no Rad  05/02/2014   CLINICAL DATA:    NASO G TUBE PLACEMENT WITH FLUORO  Fluoroscopy was utilized by the requesting physician.  No radiographic  interpretation.    Ct Angio Chest Aorta W/cm &/or Wo/cm  05/01/2014   CLINICAL DATA:  58 year old female with left upper abdominal pain. Past history of colon surgery, colostomy, hernia repair and diverticulitis.  EXAM: CT ANGIOGRAPHY CHEST, ABDOMEN AND PELVIS  TECHNIQUE: Multidetector CT imaging through the chest, abdomen and pelvis was performed using the standard protocol during bolus administration of intravenous contrast. Multiplanar reconstructed images and MIPs were obtained and reviewed to evaluate the vascular anatomy.  CONTRAST:  146mL OMNIPAQUE IOHEXOL 350 MG/ML SOLN  COMPARISON:  Prior CTA abdomen and pelvis 02/07/2014 ; prior CT scan of the chest 05/23/2013  FINDINGS: CTA CHEST FINDINGS  VASCULAR  Heart/Vascular: No evidence of intramural hematoma on the non contrasted images. Normal caliber aorta without evidence of aneurysmal dilatation or dissection. The aorta is tortuous. Scattered mild atherosclerotic vascular calcifications. The main pulmonary artery is borderline enlarged. Cardiomegaly with left ventricular dilatation. No pericardial effusion. Atherosclerotic vascular calcifications present within the left anterior descending coronary  artery.  Review of the MIP images confirms the above findings.  NON VASCULAR  Mediastinum: 1.9 cm nonspecific cystic nodule within the right thyroid gland demonstrates slight interval enlargement compared to 1.6 cm in November of 2014. Additional smaller thyroid nodules bilaterally. No suspicious adenopathy. Oblong soft tissue density in the anterior mediastinum demonstrates no significant interval change compared to the prior imaging. Today at the level of the pulmonary trunk, the abnormality measures 2.7 x 1.2 cm compared to 2.8 x 1.7 cm previously. More superiorly at the level of the aortic arch, the abnormality measures 1.4 x 2.6 cm which is very similar compared to prior.  Lungs/Pleura: Mild dependent atelectasis in the lower lobes. No suspicious pulmonary nodule or mass. Decreased AP diameter of the chest due in part to marked levoconvex scoliosis of the thoracolumbar spine.  Bones/Soft Tissues: Marked levoconvex scoliosis of the thoracic spine. Dystrophic calcifications present about both shoulder joints. 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  CTA ABDOMEN AND PELVIS FINDINGS  VASCULAR  Aorta: Heterogeneous atherosclerotic calcification throughout the normal caliber aorta. No aneurysm, dissection or significant penetrating aortic ulcer.  Celiac: Widely patent. No splenic or hepatic arterial aneurysm. Conventional hepatic arterial anatomy.  SMA: Fibro fatty and calcified atherosclerotic plaque results in mild stenosis of the proximal SMA. Distally the vessels are unremarkable.  Renals: Single dominant right renal artery. Small accessory left renal artery to the lower pole. Mild atherosclerotic vascular calcifications of the arterial origins without significant stenosis.  IMA: Patent.  Inflow: Scattered atherosclerotic vascular calcification without significant stenosis, aneurysmal dilatation or dissection.  Proximal Outflow: Widely patent and relatively spared from disease   Veins: No focal venous abnormality.  Review of the MIP images confirms the above findings.  NON-VASCULAR  Abdomen: Unremarkable CTA appearance of the stomach, duodenum, spleen, adrenal glands and pancreas. 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 Normal hepatic morphology without discrete solid lesion. Stable hepatic cysts. Stable punctate hyper enhancing focus at the hepatic dome.  Gallbladder is unremarkable. No intra or extrahepatic biliary ductal dilatation.  No hydronephrosis, nephrolithiasis or evidence of pyelonephritis. Intermediate attenuation and possibly enhancing lesion exophytic from the posterior interpolar left kidney appears slightly enlarged at 15 x 13 mm compared to 14 x 12 mm previously.  High-grade small bowel obstruction. Focal transition point in the the low mid abdomen just left of midline at the L4 level. The transition point occurs as the small bowel passes over the segment of colon heading toward the left lower quadrant colostomy. No evidence of ischemia of the affected loops of bowel. No significant surrounding inflammatory change, free fluid or free air.  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 The appendix is normal.  Pelvis: Unremarkable bladder, uterus and adnexa. Mild pelvic floor laxity.  Bones/Soft Tissues: Severe levoconvex scoliosis. 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 No definite acute osseous abnormality.  IMPRESSION: CTA CHEST  1. No acute vascular abnormality or aneurysmal dilatation. 2. Atherosclerotic vascular calcifications including coronary artery disease. 3. Cardiomegaly with left ventricular dilatation. 4. Enlarging low-attenuation nodule in the right thyroid gland compared to prior imaging. Recommend further evaluation with dedicated thyroid ultrasound as a biopsy may be warranted. 5. 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 6. Degenerative arthritis and dystrophic calcifications about both glenohumeral joints. CTA ABD/PELVIS  1. No acute vascular abnormality or aneurysmal dilatation. 2. High-grade small bowel obstruction with focal transition point just left of midline in the mid to lower abdomen. The transition point occurs as the small bowel passes over the segment of colon passing anteriorly to the left lower quadrant abdominal wall colostomy. Differential considerations include postsurgical adhesions and internal hernia. No evidence of ischemia or other complicating feature involving the loops of bowel involved in the obstruction. No free fluid or free air. 3. Similar to perhaps slightly enlarged intermediate attenuation and possibly enhancing lesion exophytic from the posterior interpolar left kidney. Renal neoplasm can not be excluded and is suspected. Recommend further evaluation with abdominal MRI with contrast. 4. Severe levoconvex scoliosis. 5. Additional ancillary findings as above without significant interval change compared to prior imaging. These results were called by telephone at the time of interpretation on 05/01/2014 at 2:15 pm to Dr. Wyona Almas, who verbally acknowledged these results.  Signed,  Criselda Peaches, MD  [facility]  [facility]   Electronically Signed   By: Jacqulynn Cadet M.D.   On: 05/01/2014 14:18   Ct Cta Abd/pel W/cm &/or W/o Cm  05/01/2014   CLINICAL DATA:  58 year old female with left upper abdominal pain. Past history of colon surgery, colostomy, hernia repair and diverticulitis.  EXAM: CT ANGIOGRAPHY CHEST, ABDOMEN AND PELVIS  TECHNIQUE: Multidetector CT imaging through the chest, abdomen and pelvis was performed using the standard protocol during bolus administration of intravenous contrast. Multiplanar reconstructed images and MIPs were obtained and reviewed to evaluate the vascular anatomy.  CONTRAST:   146mL OMNIPAQUE IOHEXOL 350 MG/ML SOLN  COMPARISON:  Prior CTA abdomen and pelvis 02/07/2014 ; prior CT scan of the chest 05/23/2013  FINDINGS: CTA CHEST FINDINGS  VASCULAR  Heart/Vascular: No evidence of intramural hematoma on the non contrasted images. Normal caliber aorta without evidence of aneurysmal dilatation or dissection. The aorta is tortuous. Scattered mild atherosclerotic vascular calcifications. The main pulmonary artery is borderline enlarged. Cardiomegaly with left ventricular dilatation. No pericardial effusion. Atherosclerotic vascular calcifications present within the left anterior descending coronary artery.  Review of the MIP images  confirms the above findings.  NON VASCULAR  Mediastinum: 1.9 cm nonspecific cystic nodule within the right thyroid gland demonstrates slight interval enlargement compared to 1.6 cm in November of 2014. Additional smaller thyroid nodules bilaterally. No suspicious adenopathy. Oblong soft tissue density in the anterior mediastinum demonstrates no significant interval change compared to the prior imaging. Today at the level of the pulmonary trunk, the abnormality measures 2.7 x 1.2 cm compared to 2.8 x 1.7 cm previously. More superiorly at the level of the aortic arch, the abnormality measures 1.4 x 2.6 cm which is very similar compared to prior.  Lungs/Pleura: Mild dependent atelectasis in the lower lobes. No suspicious pulmonary nodule or mass. Decreased AP diameter of the chest due in part to marked levoconvex scoliosis of the thoracolumbar spine.  Bones/Soft Tissues: Marked levoconvex scoliosis of the thoracic spine. Dystrophic calcifications present about both shoulder joints. 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  CTA ABDOMEN AND PELVIS FINDINGS  VASCULAR  Aorta: Heterogeneous atherosclerotic calcification throughout the normal caliber aorta. No aneurysm, dissection or significant penetrating aortic ulcer.  Celiac: Widely patent. No  splenic or hepatic arterial aneurysm. Conventional hepatic arterial anatomy.  SMA: Fibro fatty and calcified atherosclerotic plaque results in mild stenosis of the proximal SMA. Distally the vessels are unremarkable.  Renals: Single dominant right renal artery. Small accessory left renal artery to the lower pole. Mild atherosclerotic vascular calcifications of the arterial origins without significant stenosis.  IMA: Patent.  Inflow: Scattered atherosclerotic vascular calcification without significant stenosis, aneurysmal dilatation or dissection.  Proximal Outflow: Widely patent and relatively spared from disease  Veins: No focal venous abnormality.  Review of the MIP images confirms the above findings.  NON-VASCULAR  Abdomen: Unremarkable CTA appearance of the stomach, duodenum, spleen, adrenal glands and pancreas. 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 Normal hepatic morphology without discrete solid lesion. Stable hepatic cysts. Stable punctate hyper enhancing focus at the hepatic dome. Gallbladder is unremarkable. No intra or extrahepatic biliary ductal dilatation.  No hydronephrosis, nephrolithiasis or evidence of pyelonephritis. Intermediate attenuation and possibly enhancing lesion exophytic from the posterior interpolar left kidney appears slightly enlarged at 15 x 13 mm compared to 14 x 12 mm previously.  High-grade small bowel obstruction. Focal transition point in the the low mid abdomen just left of midline at the L4 level. The transition point occurs as the small bowel passes over the segment of colon heading toward the left lower quadrant colostomy. No evidence of ischemia of the affected loops of bowel. No significant surrounding inflammatory change, free fluid or free air.  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 The appendix is normal.  Pelvis: Unremarkable bladder, uterus and adnexa. Mild pelvic floor  laxity.  Bones/Soft Tissues: Severe levoconvex scoliosis. 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 No definite acute osseous abnormality.  IMPRESSION: CTA CHEST  1. No acute vascular abnormality or aneurysmal dilatation. 2. Atherosclerotic vascular calcifications including coronary artery disease. 3. Cardiomegaly with left ventricular dilatation. 4. Enlarging low-attenuation nodule in the right thyroid gland compared to prior imaging. Recommend further evaluation with dedicated thyroid ultrasound as a biopsy may be warranted. 5. 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 6. Degenerative arthritis and dystrophic calcifications about both glenohumeral joints. CTA ABD/PELVIS  1. No acute vascular abnormality or aneurysmal dilatation. 2. High-grade small bowel obstruction with focal transition point just left of midline in the mid to  lower abdomen. The transition point occurs as the small bowel passes over the segment of colon passing anteriorly to the left lower quadrant abdominal wall colostomy. Differential considerations include postsurgical adhesions and internal hernia. No evidence of ischemia or other complicating feature involving the loops of bowel involved in the obstruction. No free fluid or free air. 3. Similar to perhaps slightly enlarged intermediate attenuation and possibly enhancing lesion exophytic from the posterior interpolar left kidney. Renal neoplasm can not be excluded and is suspected. Recommend further evaluation with abdominal MRI with contrast. 4. Severe levoconvex scoliosis. 5. Additional ancillary findings as above without significant interval change compared to prior imaging. These results were called by telephone at the time of interpretation on 05/01/2014 at 2:15 pm to Dr. Wyona Almas, who verbally acknowledged these results.  Signed,  Criselda Peaches, MD  [facility]   [facility]   Electronically Signed   By: Jacqulynn Cadet M.D.   On: 05/01/2014 14:18    Microbiology: No results found for this or any previous visit (from the past 240 hour(s)).   Labs: Basic Metabolic Panel:  Recent Labs Lab 05/06/14 0350 05/06/14 1400 05/06/14 1540 05/07/14 0526 05/08/14 0355 05/09/14 0400  NA 146  --  140 138 139 138  K 3.0* >7.7* 3.3* 3.9 4.0 4.1  CL 106  --  101 103 104 103  CO2 26  --  26 22 21 23   GLUCOSE 98  --  104* 97 93 110*  BUN 7  --  5* 3* 4* 6  CREATININE 0.53  --  0.58 0.53 0.59 0.69  CALCIUM 8.4  --  9.1 9.0 8.9 8.5  MG  --   --   --  1.9  --   --    Liver Function Tests: No results for input(s): AST, ALT, ALKPHOS, BILITOT, PROT, ALBUMIN in the last 168 hours. No results for input(s): LIPASE, AMYLASE in the last 168 hours. No results for input(s): AMMONIA in the last 168 hours. CBC:  Recent Labs Lab 05/05/14 0027 05/06/14 0350 05/07/14 0526  WBC 3.8* 4.3 6.9  HGB 9.8* 9.8* 11.6*  HCT 30.1* 30.6* 35.2*  MCV 92.0 91.9 90.3  PLT 257 277 304   Cardiac Enzymes: No results for input(s): CKTOTAL, CKMB, CKMBINDEX, TROPONINI in the last 168 hours. BNP: BNP (last 3 results)  Recent Labs  12/31/13 1116 01/04/14 0245  PROBNP 6068.0* 3355.0*   CBG: No results for input(s): GLUCAP in the last 168 hours.     Signed:  Eilam Shrewsbury A  [facility] 05/11/2014, 11:54 AM

## 2014-05-11 NOTE — Plan of Care (Signed)
Problem: Phase I Progression Outcomes Goal: OOB as tolerated unless otherwise ordered Outcome: Completed/Met Date Met:  05/11/14  Problem: Phase II Progression Outcomes Goal: Discharge plan established Outcome: Completed/Met Date Met:  05/11/14 Goal: IV changed to normal saline lock Outcome: Completed/Met Date Met:  05/11/14  Problem: Phase III Progression Outcomes Goal: Activity at appropriate level-compared to baseline (UP IN CHAIR FOR HEMODIALYSIS)  Outcome: Completed/Met Date Met:  05/11/14 Goal: Voiding independently Outcome: Completed/Met Date Met:  05/11/14 Goal: IV/normal saline lock discontinued Outcome: Completed/Met Date Met:  05/11/14 Goal: Discharge plan remains appropriate-arrangements made Outcome: Completed/Met Date Met:  05/11/14 Goal: Other Phase III Outcomes/Goals Outcome: Completed/Met Date Met:  05/11/14  Problem: Discharge Progression Outcomes Goal: Discharge plan in place and appropriate Outcome: Completed/Met Date Met:  05/11/14 Goal: Pain controlled with appropriate interventions Outcome: Completed/Met Date Met:  05/11/14 Goal: Hemodynamically stable Outcome: Completed/Met Date Met:  25/63/89 Goal: Complications resolved/controlled Outcome: Completed/Met Date Met:  05/11/14 Goal: Tolerating diet Outcome: Completed/Met Date Met:  05/11/14 Goal: Activity appropriate for discharge plan Outcome: Completed/Met Date Met:  05/11/14 Goal: Other Discharge Outcomes/Goals Outcome: Completed/Met Date Met:  05/11/14

## 2014-05-11 NOTE — Progress Notes (Signed)
Physical Therapy Treatment Patient Details Name: [name] MRN: [id number] DOB: [birth year] Today's Date: 05/11/2014    History of Present Illness 58 year old female with history of Legally blind, Marfan's syndrome, Hypertension; Substance abuse; CVA,  (2/08); Fibroid; h/o Small bowel obstruction due to adhesions; Rheumatoid arthritis; and Atrial flutter. Presented to the ED with chief complaint of chest pain/abd pain with nausea and vomiting. status post colectomy/ostomy on 11/28/2012, she also has history of rheumatoid arthritis, and is followed by rheumatologist. Presents with small bowel obstruction. NG tube has been placed and is clamped 11/12.     PT Comments    Pt admitted with above. Pt currently with functional limitations due to balance, endurance and strength deficits limiting mobility.  Pt states that she performs better in home environment and uses RW at all times.  She states her bed, bath and home set up is set for her.  Overall without LOB in controlled environment and only needed assist for RW due to blindness and not knowing environment as well as occasional assist due to left UE weakness.  Pt will have aide 2 hours day and daughter assists.  Recommend daughter assist initially more until pt adjusts back to home environment.  HHPT, Point and HHRN recommended as well. If daughter cannot incr time with pt at home initially, short term NHP may benefit pt.   Pt will benefit from skilled PT to increase their independence and safety with mobility to allow discharge to the venue listed below.   Follow Up Recommendations  Home health PT;Supervision for mobility/OOB     Equipment Recommendations  None recommended by PT (Pt owns RW already)    Recommendations for Other Services       Precautions / Restrictions Precautions Precautions: Fall Precaution Comments: Pt is blind Required Braces or Orthoses: Other Brace/Splint Other Brace/Splint: Pt has orthotics in her bedroom shoes  that she always wears Restrictions Weight Bearing Restrictions: No Other Position/Activity Restrictions: BUE and BLE deformities chronic    Mobility  Bed Mobility Overal bed mobility: Needs Assistance Bed Mobility: Supine to Sit;Sit to Supine     Supine to sit: Min assist Sit to supine: Min guard   General bed mobility comments: cues for technique and hand placement (due to blindness requires cues for safety with management of IV).  Pt took incr time to get to EOB as she uses momentum to get to EOB.    Transfers Overall transfer level: Needs assistance Equipment used: Rolling walker (2 wheeled) Transfers: Sit to/from Stand Sit to Stand: Mod assist;Max assist         General transfer comment: difficulty rising from low surfaces is baseline for pt.  Once bed raised up enough, pt able to perform sit to stand to RW.  Pt with uncontrolled descent onto bed once she was finished walking.   Ambulation/Gait Ambulation/Gait assistance: Min assist;Mod assist Ambulation Distance (Feet): 80 Feet Assistive device: Rolling walker (2 wheeled) Gait Pattern/deviations: Step-to pattern;Decreased step length - right;Decreased step length - left;Decreased weight shift to left;Staggering left;Staggering right;Drifts right/left   Gait velocity interpretation: Below normal speed for age/gender General Gait Details: Pt needs assist steering RW due to blindness.  Bil knees weak with left side weaker than right.  Tends to push RW more with right side than left needing mod assist for steering walker straight.  Pt reports having decr use of left UE and that is why RW tends to steer right.  Unsteady a few times with ambulation to where PT had  to steady pt with min guard assist.  No significant LOB but general weakness of LEs noted.     Stairs            Wheelchair Mobility    Modified Rankin (Stroke Patients Only)       Balance Overall balance assessment: Needs assistance;History of  Falls Sitting-balance support: Bilateral upper extremity supported;Feet supported Sitting balance-Leahy Scale: Poor Sitting balance - Comments: Pt required min guard A to maintain balance with tendency for posterior lean when LOB occurred. Cues to lean forward during mobility. Postural control: Posterior lean Standing balance support: Bilateral upper extremity supported;During functional activity Standing balance-Leahy Scale: Poor                      Cognition Arousal/Alertness: Awake/alert Behavior During Therapy: WFL for tasks assessed/performed Overall Cognitive Status: Within Functional Limits for tasks assessed                      Exercises General Exercises - Lower Extremity Ankle Circles/Pumps: AROM;Both;5 reps;Seated Long Arc Quad: AROM;Both;10 reps;Seated Hip Flexion/Marching: AROM;Both;10 reps;Seated    General Comments        Pertinent Vitals/Pain Pain Assessment: No/denies pain  VSS    Home Living                      Prior Function            PT Goals (current goals can now be found in the care plan section) Progress towards PT goals: Progressing toward goals    Frequency  Min 3X/week    PT Plan Current plan remains appropriate    Co-evaluation             End of Session Equipment Utilized During Treatment: Gait belt Activity Tolerance: Patient limited by fatigue Patient left: in bed;with call bell/phone within reach;with bed alarm set     Time: 1036-1100 PT Time Calculation (min) (ACUTE ONLY): 24 min  Charges:  $Gait Training: 8-22 mins $Therapeutic Exercise: 8-22 mins                    G CodesIrwin Brakeman F May 17, 2014, 11:40 AM [facility] [phone] (pager)

## 2014-05-12 ENCOUNTER — Telehealth: Payer: Self-pay | Admitting: Family Medicine

## 2014-05-12 NOTE — Telephone Encounter (Signed)
Call in Ciprodex ear drops to use 4 drops bid in either ear prn pain, 10 ml with 11 rf

## 2014-05-12 NOTE — Telephone Encounter (Signed)
Pt is calling to see if the $ 50 fee can be waived she said she is visually blind and in a wheel chair she did not have any to come get her dressed that day and she just got out of the hospital last night. Please call patient to let her know if you can waive this [phone]

## 2014-05-12 NOTE — Telephone Encounter (Signed)
Pt called to say that she need a refill on her ear drops. She could not remember the name of the medicine.   Pharmacy; [facility] [address]

## 2014-05-12 NOTE — Telephone Encounter (Signed)
Pls advise.  

## 2014-05-13 MED ORDER — CIPROFLOXACIN-DEXAMETHASONE 0.3-0.1 % OT SUSP: 4.0000 [drp] | Freq: Two times a day (BID) | OTIC | Status: DC

## 2014-05-13 NOTE — Telephone Encounter (Signed)
I [facility] to [facility], also bottle came in a 7.5 ml, so that is what I ordered.

## 2014-05-18 ENCOUNTER — Encounter: Payer: Self-pay | Admitting: Family Medicine

## 2014-05-18 ENCOUNTER — Telehealth: Payer: Self-pay | Admitting: Family Medicine

## 2014-05-18 ENCOUNTER — Ambulatory Visit (INDEPENDENT_AMBULATORY_CARE_PROVIDER_SITE_OTHER): Payer: Medicare Other | Admitting: Family Medicine

## 2014-05-18 VITALS — BP 130/70 | HR 86 | Temp 98.6°F

## 2014-05-18 DIAGNOSIS — I484 Atypical atrial flutter: Secondary | ICD-10-CM

## 2014-05-18 DIAGNOSIS — N2889 Other specified disorders of kidney and ureter: Secondary | ICD-10-CM

## 2014-05-18 DIAGNOSIS — K5669 Other intestinal obstruction: Secondary | ICD-10-CM

## 2014-05-18 DIAGNOSIS — K56609 Unspecified intestinal obstruction, unspecified as to partial versus complete obstruction: Secondary | ICD-10-CM

## 2014-05-18 DIAGNOSIS — E041 Nontoxic single thyroid nodule: Secondary | ICD-10-CM

## 2014-05-18 MED ORDER — CIPROFLOXACIN-DEXAMETHASONE 0.3-0.1 % OT SUSP: 4.0000 [drp] | Freq: Two times a day (BID) | OTIC | Status: DC

## 2014-05-18 NOTE — Telephone Encounter (Signed)
Patient states she wants you to call her ciprofloxacin-dexamethasone (CIPRODEX) otic suspension rx in to [place], [place]

## 2014-05-18 NOTE — Progress Notes (Signed)
Pre visit review using our clinic review tool, if applicable. No additional management support is needed unless otherwise documented below in the visit note. 

## 2014-05-18 NOTE — Telephone Encounter (Signed)
I resent script to [facility] and canceled the script that was sent to [facility].

## 2014-05-18 NOTE — Progress Notes (Signed)
   Subjective:    Patient ID: [name], female    DOB: [birth year], 58 y.o.   MRN: [id number]  HPI Here to follow up a hospital stay from 05-01-14 to 05-11-14 for chest pains and SOB. This turned out to be due to sinus tachycardia and she even had a bout of atrial fibrillation. These were treated with IV Cardizem and she responded nicely. She quickly returned to sinus rhythm and apparently has remained in NSR since going home. She was also shown to have a high grade SBO and this is what probably triggered the cardiac issues. She was put on bowel rest and Surgery was consulted. She eventually cleared and resumed normal bowel function. Since going home she denies any chest pain or SOB, no abdominal pain. Her colostomy is working with regular stooling. She is taking food by mouth well. During the CT scans she had a right thyroid lesion was seen measuring 1.9 cm as well as smaller scattered nodules. This was present in 04-2013 but it seems to have increased in size. Also a 15x13 mm lesion was seen off the left kidney. This was also seen in 04-2013 but it has increased in size a bit.   Review of Systems  Constitutional: Negative.   Respiratory: Negative.   Cardiovascular: Negative.   Gastrointestinal: Negative.   Neurological: Negative.        Objective:   Physical Exam  Constitutional: She appears well-developed and well-nourished.  Neck: No thyromegaly present.  Cardiovascular: Normal rate, regular rhythm, normal heart sounds and intact distal pulses.   Pulmonary/Chest: Effort normal and breath sounds normal.  Abdominal: Soft. Bowel sounds are normal. She exhibits no distension and no mass. There is no tenderness. There is no rebound and no guarding.  Lymphadenopathy:    She has no cervical adenopathy.          Assessment & Plan:  Her heart rhythm is stable and her HTN is well controlled. Her bowels seem to be functioning normally. We will set up an abdominal MRI to evaluate the  kidney lesion and a thyroid US to evaluate the nodule.

## 2014-05-25 ENCOUNTER — Telehealth: Payer: Self-pay | Admitting: Family Medicine

## 2014-05-25 DIAGNOSIS — K5660 Unspecified intestinal obstruction: Secondary | ICD-10-CM

## 2014-05-25 DIAGNOSIS — I4891 Unspecified atrial fibrillation: Secondary | ICD-10-CM

## 2014-05-25 DIAGNOSIS — Q8740 Marfan's syndrome, unspecified: Secondary | ICD-10-CM

## 2014-05-25 NOTE — Telephone Encounter (Signed)
[name] called from [facility] called and left a message, requesting occupational therapy consult to help pt with ADL's.

## 2014-05-25 NOTE — Telephone Encounter (Signed)
Correct number is [phone], per Dr. Sarajane Jews okay to give verbal order. I called Merry Proud and left a voice message with verbal order for below request.

## 2014-05-28 ENCOUNTER — Telehealth: Payer: Self-pay | Admitting: Family Medicine

## 2014-05-28 NOTE — Telephone Encounter (Signed)
Freda Munro, nurse w/ [facility] states that pt's home health nursing orders have not been renewed. They cannot go back out. Freda Munro feels like pt should been seen at least another 3 wks. She is concerned for pt. When she went last week she found pt in a "mess".  Her colostomy bag was overflowing. The house was a mess. Door unlocked and daughter comes and goes.  Freda Munro states the pt is at daughter mercy.  She states pt has several health issues, even though vitals are good, pt still has the health issues. Freda Munro states they will take a verbal, and hopes she will be able to see pt.

## 2014-05-28 NOTE — Telephone Encounter (Signed)
Verbal orders given  

## 2014-05-28 NOTE — Telephone Encounter (Signed)
Please give them a verbal order to continue home health nursing visits

## 2014-06-02 ENCOUNTER — Telehealth: Payer: Self-pay | Admitting: Certified Nurse Midwife

## 2014-06-02 NOTE — Telephone Encounter (Addendum)
Pt calling to check on status of waiving her dnka fee. She is legally blind and didn't have anyone to get her dressed that day. Said she just got out of hospital the night before.  Coming in on Monday and would like to know before then.

## 2014-06-03 ENCOUNTER — Encounter (HOSPITAL_COMMUNITY): Payer: Self-pay | Admitting: Internal Medicine

## 2014-06-03 NOTE — Telephone Encounter (Signed)
Fee waived this once, please advise patient of policy and that this fee cannot waived in the future

## 2014-06-03 NOTE — Telephone Encounter (Signed)
Left message to call regarding message about dnka fee.

## 2014-06-03 NOTE — Telephone Encounter (Signed)
Pt aware that fee will be waived this one time.

## 2014-06-07 ENCOUNTER — Ambulatory Visit (INDEPENDENT_AMBULATORY_CARE_PROVIDER_SITE_OTHER): Payer: Medicare Other | Admitting: Certified Nurse Midwife

## 2014-06-07 ENCOUNTER — Encounter: Payer: Self-pay | Admitting: Certified Nurse Midwife

## 2014-06-07 VITALS — BP 124/84 | HR 70 | Resp 16

## 2014-06-07 DIAGNOSIS — Z124 Encounter for screening for malignant neoplasm of cervix: Secondary | ICD-10-CM

## 2014-06-07 DIAGNOSIS — Z01419 Encounter for gynecological examination (general) (routine) without abnormal findings: Secondary | ICD-10-CM

## 2014-06-07 NOTE — Patient Instructions (Signed)

## 2014-06-07 NOTE — Progress Notes (Signed)
58 y.o. G1P1001 Single African American Fe here for annual exam.  Menopausal no HRT. Denies vaginal bleeding or vaginal dryness. Patient in wheelchair for easier ambulation now. Daughter and aide cares for her daily and has seen not bleeding on her underwear. No STD concerns now. Still having difficulty with legs giving way, but walks with brace when able. Hypertension stable on medication with PCP management, aex/labs. Sees rheumatologist for gout and osteoporosis management. Smoking less now "but still enjoying it"! Not sexually active. Occasional hot flash. Surgery for diverticulitis, now has colostomy. Doing well. Has 3 abdominal hernias that have not been repaired. Unable to do this due to Marfan's per daughter. Hearing aids the same. No other health issues today.  Patient's last menstrual period was 06/25/2006 (lmp unknown).          Sexually active: No.  The current method of family planning is none.    Exercising: Yes.    physical therapy Smoker:  no  Health Maintenance: Pap:  10-08-11 neg MMG:  07-27-13 category a density,birads 1: neg Colonoscopy:  2003, 2015 per daughter BMD:   2015  TDaP:  2012 Labs: none Self breast exam:  done occ   reports that she quit smoking about 18 months ago. Her smoking use included Cigarettes. She smoked 1.00 pack per day. She has never used smokeless tobacco. She reports that she does not drink alcohol or use illicit drugs.  Past Medical History  Diagnosis Date  . Legally blind     since pt was a teenager  . Hearing aid worn     pt wears bilateral hearing aids  . Marfan's syndrome affecting skin     with scolosis  . Osteoporosis   . Hypertension   . Substance abuse     recovering alcoholic F81 years   . Hernia 4/08  . 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 Chronic gout 2008  . Stroke 2/08  . Fibroid   . Status post bunionectomy 1/10    bilateral   . Diverticulitis of large intestine with perforation   . Small bowel  obstruction due to adhesions   . Rheumatoid arthritis     sees Dr. Gavin Pound   . SBO (small bowel obstruction) 01/05/2013  . Atrial flutter     Past Surgical History  Procedure Laterality Date  . Bunionectomy Bilateral 1/10  . Total knee arthroplasty Bilateral 6/08  . Hernia repair    . Laparotomy N/A 11/27/2012    Procedure: EXPLORATORY LAPAROTOMY SIGMOID COLECTOMY, COLOSTOMY;  Surgeon: Madilyn Hook, DO;  Location: [facility];  Service: General;  Laterality: N/A;  . Colostomy  11/27/2012  . Laparoscopic abdominal exploration N/A 01/06/2013    Procedure: LAPAROSCOPIC ABDOMINAL EXPLORATION;  Surgeon: Adin Hector, MD;  Location: [facility];  Service: General;  Laterality: N/A;  . Laparoscopic lysis of adhesions N/A 01/06/2013    Procedure: LAPAROSCOPIC LYSIS OF ADHESIONS/ INTEROTOMY REPAIR;  Surgeon: Adin Hector, MD;  Location: [facility];  Service: General;  Laterality: N/A;  . Colon surgery      colectomy  . Ablation  01/04/14    atrial flutter ablation (2 circuits) by Dr Lovena Le  . Atrial flutter ablation N/A 01/04/2014    Procedure: ATRIAL FLUTTER ABLATION;  Surgeon: Evans Lance, MD;  Location: [facility] CATH LAB;  Service: Cardiovascular;  Laterality: N/A;    Current Outpatient Prescriptions  Medication Sig Dispense Refill  . albuterol (PROVENTIL HFA;VENTOLIN HFA) 108 (90 BASE) MCG/ACT inhaler Inhale  1 puff into the lungs every 4 (four) hours as needed for wheezing or shortness of breath. 1 Inhaler 11  . allopurinol (ZYLOPRIM) 100 MG tablet Take 100 mg by mouth daily.    Marland Kitchen amLODipine (NORVASC) 10 MG tablet take 1 tablet by mouth once daily 30 tablet 3  . aspirin EC 81 MG tablet Take 1 tablet (81 mg total) by mouth daily.    . Calcium Carbonate-Vitamin D (CALCIUM + D PO) Take 1,200 mg by mouth daily.    . cetirizine (ZYRTEC) 10 MG tablet Take 10 mg by mouth daily.    . ciprofloxacin-dexamethasone (CIPRODEX) otic suspension Place 4 drops into both ears 2 (two) times daily. 7.5 mL 11  .  Cyanocobalamin (VITAMIN B 12 PO) Take 1 tablet by mouth daily.    . cyclobenzaprine (FLEXERIL) 10 MG tablet Take 1 tablet (10 mg total) by mouth 3 (three) times daily as needed for muscle spasms. 270 tablet 3  . EPINEPHrine (EPIPEN 2-PAK) 0.3 mg/0.3 mL IJ SOAJ injection Inject 0.3 mLs (0.3 mg total) into the muscle once. 2 Device 5  . folic acid (FOLVITE) 1 MG tablet Take 1 mg by mouth daily.    Marland Kitchen HYDROcodone-acetaminophen (NORCO) 10-325 MG per tablet Take 1 tablet by mouth every 6 (six) hours as needed for moderate pain. 120 tablet 0  . isoniazid (NYDRAZID) 300 MG tablet Take 300 mg by mouth daily.    Marland Kitchen ketoconazole (NIZORAL) 2 % cream Apply 1 application topically 2 (two) times daily. 30 g 5  . megestrol (MEGACE ES) 625 MG/5ML suspension Take 5 mL (625 mg total) by mouth 2 (two) times daily before a meal. 150 mL 5  . methotrexate 25 MG/ML injection Inject 80 mg into the skin once a week. Take on Mondays    . metoprolol succinate (TOPROL-XL) 25 MG 24 hr tablet     . Multiple Vitamins-Minerals (MULTIVITAMIN PO) Take 1 tablet by mouth daily.     Marland Kitchen NEXIUM 40 MG capsule take 1 capsule by mouth twice a day 60 capsule 3  . polyethylene glycol (MIRALAX / GLYCOLAX) packet Take 17 g by mouth daily. 14 each 0  . pyridOXINE (VITAMIN B-6) 50 MG tablet Take 50 mg by mouth daily.    . risedronate (ACTONEL) 150 MG tablet Take 150 mg by mouth every 30 (thirty) days. with water on empty stomach, nothing by mouth or lie down for next 30 minutes.    . traMADol (ULTRAM) 50 MG tablet take 1 to 2 tablets by mouth every 6 hours if needed for pain 120 tablet 5  . vitamin C (ASCORBIC ACID) 500 MG tablet Take 500 mg by mouth daily.     No current facility-administered medications for this visit.    Family History  Problem Relation Age of Onset  . Heart attack Neg Hx     ROS:  Pertinent items are noted in HPI.  Otherwise, a comprehensive ROS was negative.  Exam:   BP 124/84 mmHg  Pulse 70  Resp 16  Ht   Wt    LMP 06/25/2006 (LMP Unknown)    Ht Readings from Last 3 Encounters:  05/09/14 5\' 8"  (1.727 m)  02/10/14 5\' 8"  (1.727 m)  01/05/14 5\' 8"  (1.727 m)    General appearance: alert, cooperative and appears stated age.  Ambulates with assistance Head: Normocephalic, without obvious abnormality, atraumatic Neck: no adenopathy, supple, symmetrical, trachea midline and thyroid nodule palpated, patient had [place], per daughter no issues Lungs: clear to auscultation  bilaterally Breasts: normal appearance, no masses or tenderness, No nipple retraction or dimpling, No nipple discharge or bleeding, No axillary or supraclavicular adenopathy pendulous Heart: regular rate and rhythm Abdomen: soft, non-tender; no masses,  no organomegaly, abdominal hernias 3 Extremities: extremities normal, atraumatic, no cyanosis or edema Skin: Skin color, texture, turgor normal. No rashes or lesions Lymph nodes: Cervical, supraclavicular, and axillary nodes normal. No abnormal inguinal nodes palpated Neurologic: Grossly normal   Pelvic: External genitalia:  no lesions              Urethra:  normal appearing urethra with no masses, tenderness or lesions              Bartholin's and Skene's: normal                 Vagina: normal appearing vagina with normal color and discharge, no lesions              Cervix: very posterior, non tender , normal appearance              Pap taken: Yes.   Bimanual Exam:  Uterus:  small no tenderness, no masses, hard to palpate due to recent surgery and abdominal hernias central on abdomen              Adnexa: normal adnexa and no mass, fullness, tenderness               Rectovaginal: Confirms               Anus:  normal sphincter tone, no lesions  A:  Well Woman with normal exam  Menopausal no HRT  Hypertension with PCP management  Marfans Syndrome with scolosis  Legally blind and wears bilateral hearing aids  Osteoporosis and gout history with Rheumatology management  Recent colostomy  due steroid induced diverticulitis   P:   Reviewed health and wellness pertinent to exam  Stressed importance of using assistance as needed for care. Daughter or aid to notify if vaginal bleeding noted on bed or underwear.  Continue follow up with MD's as indicated for other health problems.  Pap smear taken today   counseled on breast self exam, mammography screening, adequate intake of calcium and vitamin D, diet and exercise and physical therapy as indicated. return annually or prn  An After Visit Summary was printed and given to the patient.

## 2014-06-08 LAB — IPS PAP SMEAR ONLY

## 2014-06-09 NOTE — Progress Notes (Signed)
Reviewed personally.  M. Suzanne Sherol Sabas, MD.  

## 2014-06-11 ENCOUNTER — Telehealth: Payer: Self-pay | Admitting: Family Medicine

## 2014-06-11 NOTE — Telephone Encounter (Signed)
Noted. Have her see [place] prn

## 2014-06-11 NOTE — Telephone Encounter (Signed)
Caryl Pina from [facility] call to say she spoke with pt and pt is complaining about pain in right side going into her back. She also has some nausea. Caryl Pina said if you have any more question to contact the pt. She said she just wanted to report the issue.

## 2014-06-11 NOTE — Telephone Encounter (Signed)
Fee waived per Seth Bake.

## 2014-06-14 NOTE — Addendum Note (Signed)
Addended by: Regina Eck on: 06/14/2014 04:43 PM   Modules accepted: Orders, SmartSet

## 2014-06-22 ENCOUNTER — Other Ambulatory Visit: Payer: Self-pay | Admitting: Family Medicine

## 2014-06-23 ENCOUNTER — Telehealth: Payer: Self-pay

## 2014-06-23 NOTE — Telephone Encounter (Signed)
Spoke with patient. Advised patient of results and message from [facility] as seen below. Patient is agreeable and verbalizes understanding. 08 recall entered.  Routing to provider for final review. Patient agreeable to disposition. Will close encounter

## 2014-06-23 NOTE — Telephone Encounter (Signed)
Patient returning call. She left a voicemail at lunch.

## 2014-06-23 NOTE — Telephone Encounter (Signed)
Left message to call [place] at [phone].   Notes Recorded by Regina Eck, CNM on 06/23/2014 at 7:34 AM Notify patient that pap smear was LSIL but negative HPVHR, very important to repeat in one year 08

## 2014-06-25 DIAGNOSIS — M129 Arthropathy, unspecified: Secondary | ICD-10-CM | POA: Diagnosis not present

## 2014-06-26 DIAGNOSIS — M129 Arthropathy, unspecified: Secondary | ICD-10-CM | POA: Diagnosis not present

## 2014-06-27 DIAGNOSIS — M129 Arthropathy, unspecified: Secondary | ICD-10-CM | POA: Diagnosis not present

## 2014-06-28 DIAGNOSIS — M129 Arthropathy, unspecified: Secondary | ICD-10-CM | POA: Diagnosis not present

## 2014-06-29 DIAGNOSIS — M129 Arthropathy, unspecified: Secondary | ICD-10-CM | POA: Diagnosis not present

## 2014-06-30 ENCOUNTER — Encounter: Payer: Self-pay | Admitting: *Deleted

## 2014-06-30 ENCOUNTER — Other Ambulatory Visit: Payer: Self-pay | Admitting: Family Medicine

## 2014-06-30 DIAGNOSIS — M129 Arthropathy, unspecified: Secondary | ICD-10-CM | POA: Diagnosis not present

## 2014-07-01 DIAGNOSIS — M129 Arthropathy, unspecified: Secondary | ICD-10-CM | POA: Diagnosis not present

## 2014-07-02 DIAGNOSIS — M129 Arthropathy, unspecified: Secondary | ICD-10-CM | POA: Diagnosis not present

## 2014-07-03 DIAGNOSIS — M129 Arthropathy, unspecified: Secondary | ICD-10-CM | POA: Diagnosis not present

## 2014-07-04 DIAGNOSIS — M129 Arthropathy, unspecified: Secondary | ICD-10-CM | POA: Diagnosis not present

## 2014-07-05 DIAGNOSIS — R7612 Nonspecific reaction to cell mediated immunity measurement of gamma interferon antigen response without active tuberculosis: Secondary | ICD-10-CM | POA: Diagnosis not present

## 2014-07-05 DIAGNOSIS — M79641 Pain in right hand: Secondary | ICD-10-CM | POA: Diagnosis not present

## 2014-07-05 DIAGNOSIS — M129 Arthropathy, unspecified: Secondary | ICD-10-CM | POA: Diagnosis not present

## 2014-07-05 DIAGNOSIS — Z79899 Other long term (current) drug therapy: Secondary | ICD-10-CM | POA: Diagnosis not present

## 2014-07-05 DIAGNOSIS — M1A09X0 Idiopathic chronic gout, multiple sites, without tophus (tophi): Secondary | ICD-10-CM | POA: Diagnosis not present

## 2014-07-05 DIAGNOSIS — M0589 Other rheumatoid arthritis with rheumatoid factor of multiple sites: Secondary | ICD-10-CM | POA: Diagnosis not present

## 2014-07-06 DIAGNOSIS — M129 Arthropathy, unspecified: Secondary | ICD-10-CM | POA: Diagnosis not present

## 2014-07-07 DIAGNOSIS — M129 Arthropathy, unspecified: Secondary | ICD-10-CM | POA: Diagnosis not present

## 2014-07-08 DIAGNOSIS — M129 Arthropathy, unspecified: Secondary | ICD-10-CM | POA: Diagnosis not present

## 2014-07-09 DIAGNOSIS — M129 Arthropathy, unspecified: Secondary | ICD-10-CM | POA: Diagnosis not present

## 2014-07-10 DIAGNOSIS — M129 Arthropathy, unspecified: Secondary | ICD-10-CM | POA: Diagnosis not present

## 2014-07-11 DIAGNOSIS — M129 Arthropathy, unspecified: Secondary | ICD-10-CM | POA: Diagnosis not present

## 2014-07-12 ENCOUNTER — Ambulatory Visit
Admission: RE | Admit: 2014-07-12 | Discharge: 2014-07-12 | Disposition: A | Discharge status: Home / Self Care | Payer: Medicare Other | Source: Ambulatory Visit | Attending: Family Medicine | Admitting: Family Medicine

## 2014-07-12 DIAGNOSIS — K7689 Other specified diseases of liver: Secondary | ICD-10-CM | POA: Diagnosis not present

## 2014-07-12 DIAGNOSIS — N289 Disorder of kidney and ureter, unspecified: Secondary | ICD-10-CM | POA: Diagnosis not present

## 2014-07-12 DIAGNOSIS — Z933 Colostomy status: Secondary | ICD-10-CM | POA: Diagnosis not present

## 2014-07-12 DIAGNOSIS — M419 Scoliosis, unspecified: Secondary | ICD-10-CM | POA: Diagnosis not present

## 2014-07-12 DIAGNOSIS — E042 Nontoxic multinodular goiter: Secondary | ICD-10-CM | POA: Diagnosis not present

## 2014-07-12 DIAGNOSIS — M129 Arthropathy, unspecified: Secondary | ICD-10-CM | POA: Diagnosis not present

## 2014-07-12 DIAGNOSIS — N2889 Other specified disorders of kidney and ureter: Secondary | ICD-10-CM

## 2014-07-12 DIAGNOSIS — E041 Nontoxic single thyroid nodule: Secondary | ICD-10-CM

## 2014-07-12 MED ORDER — GADOBENATE DIMEGLUMINE 529 MG/ML IV SOLN
13.0000 mL | Freq: Once | INTRAVENOUS | Status: AC | PRN
  Administered 2014-07-12: 13 mL via INTRAVENOUS

## 2014-07-13 DIAGNOSIS — M129 Arthropathy, unspecified: Secondary | ICD-10-CM | POA: Diagnosis not present

## 2014-07-14 ENCOUNTER — Telehealth: Payer: Self-pay | Admitting: Family Medicine

## 2014-07-14 DIAGNOSIS — M129 Arthropathy, unspecified: Secondary | ICD-10-CM | POA: Diagnosis not present

## 2014-07-14 NOTE — Telephone Encounter (Signed)
I spoke with pt and she said that we did order these test. I told pt that we would call her back with results.

## 2014-07-14 NOTE — Telephone Encounter (Signed)
I left pt a voice message, Dr. Sarajane Jews has received test results for MRI & thyroid ultra sound, however we did not order these. Also there was a mention of a biopsy of the kidney. We would like to find out what doctor has ordered these? I will try to reach pt again today.

## 2014-07-15 ENCOUNTER — Telehealth: Payer: Self-pay | Admitting: Family Medicine

## 2014-07-15 DIAGNOSIS — M129 Arthropathy, unspecified: Secondary | ICD-10-CM | POA: Diagnosis not present

## 2014-07-15 DIAGNOSIS — E041 Nontoxic single thyroid nodule: Secondary | ICD-10-CM

## 2014-07-15 NOTE — Addendum Note (Signed)
Addended by: [name] A on: 07/15/2014 09:58 AM   Modules accepted: Orders

## 2014-07-15 NOTE — Telephone Encounter (Signed)
[name] said order for tsh ultrasound is not enter correctly in epic. Please call her

## 2014-07-18 DIAGNOSIS — M129 Arthropathy, unspecified: Secondary | ICD-10-CM | POA: Diagnosis not present

## 2014-07-19 ENCOUNTER — Telehealth: Payer: Self-pay | Admitting: Family Medicine

## 2014-07-19 DIAGNOSIS — E041 Nontoxic single thyroid nodule: Secondary | ICD-10-CM

## 2014-07-19 DIAGNOSIS — M129 Arthropathy, unspecified: Secondary | ICD-10-CM | POA: Diagnosis not present

## 2014-07-19 NOTE — Telephone Encounter (Signed)
done

## 2014-07-19 NOTE — Telephone Encounter (Signed)
I do not see actual order for biopsy of thyroid in computer?

## 2014-07-19 NOTE — Telephone Encounter (Signed)
Request to to put in a Ultrasound thyroid biopsy, order was put in under meds & order. Per Dr. Sarajane Jews okay to put in order, which I did.

## 2014-07-20 DIAGNOSIS — M129 Arthropathy, unspecified: Secondary | ICD-10-CM | POA: Diagnosis not present

## 2014-07-21 DIAGNOSIS — M129 Arthropathy, unspecified: Secondary | ICD-10-CM | POA: Diagnosis not present

## 2014-07-22 DIAGNOSIS — M129 Arthropathy, unspecified: Secondary | ICD-10-CM | POA: Diagnosis not present

## 2014-07-23 DIAGNOSIS — M129 Arthropathy, unspecified: Secondary | ICD-10-CM | POA: Diagnosis not present

## 2014-07-24 DIAGNOSIS — M129 Arthropathy, unspecified: Secondary | ICD-10-CM | POA: Diagnosis not present

## 2014-07-25 DIAGNOSIS — M129 Arthropathy, unspecified: Secondary | ICD-10-CM | POA: Diagnosis not present

## 2014-07-26 DIAGNOSIS — M129 Arthropathy, unspecified: Secondary | ICD-10-CM | POA: Diagnosis not present

## 2014-07-27 DIAGNOSIS — M129 Arthropathy, unspecified: Secondary | ICD-10-CM | POA: Diagnosis not present

## 2014-07-28 DIAGNOSIS — M129 Arthropathy, unspecified: Secondary | ICD-10-CM | POA: Diagnosis not present

## 2014-07-29 DIAGNOSIS — M129 Arthropathy, unspecified: Secondary | ICD-10-CM | POA: Diagnosis not present

## 2014-07-30 DIAGNOSIS — M129 Arthropathy, unspecified: Secondary | ICD-10-CM | POA: Diagnosis not present

## 2014-07-31 DIAGNOSIS — M129 Arthropathy, unspecified: Secondary | ICD-10-CM | POA: Diagnosis not present

## 2014-08-01 DIAGNOSIS — M129 Arthropathy, unspecified: Secondary | ICD-10-CM | POA: Diagnosis not present

## 2014-08-02 ENCOUNTER — Other Ambulatory Visit: Payer: Self-pay | Admitting: Family Medicine

## 2014-08-02 DIAGNOSIS — M129 Arthropathy, unspecified: Secondary | ICD-10-CM | POA: Diagnosis not present

## 2014-08-03 ENCOUNTER — Ambulatory Visit
Admission: RE | Admit: 2014-08-03 | Discharge: 2014-08-03 | Disposition: A | Discharge status: Home / Self Care | Payer: Medicare Other | Source: Ambulatory Visit | Attending: Family Medicine | Admitting: Family Medicine

## 2014-08-03 ENCOUNTER — Other Ambulatory Visit (HOSPITAL_COMMUNITY)
Admission: RE | Admit: 2014-08-03 | Discharge: 2014-08-03 | Disposition: A | Discharge status: Home / Self Care | Payer: Medicare Other | Source: Ambulatory Visit | Attending: Interventional Radiology | Admitting: Interventional Radiology

## 2014-08-03 DIAGNOSIS — E041 Nontoxic single thyroid nodule: Secondary | ICD-10-CM | POA: Diagnosis present

## 2014-08-03 DIAGNOSIS — M129 Arthropathy, unspecified: Secondary | ICD-10-CM | POA: Diagnosis not present

## 2014-08-03 NOTE — Telephone Encounter (Signed)
Can we refill this? 

## 2014-08-04 DIAGNOSIS — M129 Arthropathy, unspecified: Secondary | ICD-10-CM | POA: Diagnosis not present

## 2014-08-05 ENCOUNTER — Telehealth: Payer: Self-pay | Admitting: Family Medicine

## 2014-08-05 DIAGNOSIS — M129 Arthropathy, unspecified: Secondary | ICD-10-CM | POA: Diagnosis not present

## 2014-08-05 NOTE — Telephone Encounter (Signed)
Pt need new rx hydrocodone °

## 2014-08-06 DIAGNOSIS — M129 Arthropathy, unspecified: Secondary | ICD-10-CM | POA: Diagnosis not present

## 2014-08-06 MED ORDER — HYDROCODONE-ACETAMINOPHEN 10-325 MG PO TABS: 1.0000 | ORAL_TABLET | Freq: Four times a day (QID) | ORAL | Status: DC | PRN

## 2014-08-06 NOTE — Telephone Encounter (Signed)
Script is ready for pick up and I spoke with pt.  

## 2014-08-06 NOTE — Addendum Note (Signed)
Addended by: [name] A on: 08/06/2014 01:34 PM   Modules accepted: Orders

## 2014-08-06 NOTE — Telephone Encounter (Signed)
done

## 2014-08-07 DIAGNOSIS — M129 Arthropathy, unspecified: Secondary | ICD-10-CM | POA: Diagnosis not present

## 2014-08-08 DIAGNOSIS — M129 Arthropathy, unspecified: Secondary | ICD-10-CM | POA: Diagnosis not present

## 2014-08-09 ENCOUNTER — Telehealth: Payer: Self-pay | Admitting: Family Medicine

## 2014-08-09 DIAGNOSIS — M129 Arthropathy, unspecified: Secondary | ICD-10-CM | POA: Diagnosis not present

## 2014-08-09 NOTE — Telephone Encounter (Signed)
Pt request refill of the following:  cyclobenzaprine (FLEXERIL) 10 MG tablet   Phamacy: [facility]

## 2014-08-10 ENCOUNTER — Telehealth: Payer: Self-pay | Admitting: Family Medicine

## 2014-08-10 DIAGNOSIS — M129 Arthropathy, unspecified: Secondary | ICD-10-CM | POA: Diagnosis not present

## 2014-08-10 MED ORDER — CYCLOBENZAPRINE HCL 10 MG PO TABS: 10.0000 mg | ORAL_TABLET | Freq: Three times a day (TID) | ORAL | Status: DC | PRN

## 2014-08-10 NOTE — Telephone Encounter (Signed)
Per [employer], Medicare Part D does not cover the requested medication. Plan does cover meloxicam, Lyrica, and Tizanidine.  She didn't know what the co-pay will be, that is for the patient to call member services and find out.

## 2014-08-10 NOTE — Telephone Encounter (Signed)
Pt said she do not want to pay and that Dr Sarajane Jews contacts her insurance company and she usually does not pay for this medicine.

## 2014-08-10 NOTE — Telephone Encounter (Signed)
I sent script e-scribe. 

## 2014-08-11 DIAGNOSIS — M129 Arthropathy, unspecified: Secondary | ICD-10-CM | POA: Diagnosis not present

## 2014-08-11 NOTE — Telephone Encounter (Signed)
I spoke with pt and she does want to try alternative for Flexeril.

## 2014-08-11 NOTE — Telephone Encounter (Signed)
PA was denied.  Patient must try and fail 3 of the formulary alternatives:  Celecoxib, Diflunisal, Etodolac, Ibuprofen, Ketoprofen, Meloxicam, Nabumetone, Tizanidine tablet or give specific clinical reason why the alternatives are not appropriate to treat the patient's condition.

## 2014-08-11 NOTE — Telephone Encounter (Signed)
PA was called in to [facility].  Ref# [id number]

## 2014-08-11 NOTE — Telephone Encounter (Signed)
We will wait for the fax to come

## 2014-08-11 NOTE — Telephone Encounter (Signed)
This is a duplicate note, see previous one with same date.

## 2014-08-11 NOTE — Telephone Encounter (Signed)
Pt spoke with [employer] and they said they were faxing over a form to be filled out asking why pt need this medication Flexeril

## 2014-08-12 DIAGNOSIS — M129 Arthropathy, unspecified: Secondary | ICD-10-CM | POA: Diagnosis not present

## 2014-08-12 DIAGNOSIS — Z933 Colostomy status: Secondary | ICD-10-CM | POA: Diagnosis not present

## 2014-08-12 MED ORDER — TIZANIDINE HCL 4 MG PO TABS: 4.0000 mg | ORAL_TABLET | Freq: Four times a day (QID) | ORAL | Status: DC | PRN

## 2014-08-12 NOTE — Telephone Encounter (Signed)
I sent new script e-scribe and spoke with pt. 

## 2014-08-12 NOTE — Addendum Note (Signed)
Addended by: Aggie Hacker A on: 08/12/2014 01:20 PM   Modules accepted: Orders, Medications

## 2014-08-12 NOTE — Telephone Encounter (Signed)
Noted. Instead of Flexeril call in Tizanidine 4 mg to take every 6 hours prn muscle spasms, 90 day supply with 3 rf

## 2014-08-13 DIAGNOSIS — M129 Arthropathy, unspecified: Secondary | ICD-10-CM | POA: Diagnosis not present

## 2014-08-14 DIAGNOSIS — M129 Arthropathy, unspecified: Secondary | ICD-10-CM | POA: Diagnosis not present

## 2014-08-15 DIAGNOSIS — M129 Arthropathy, unspecified: Secondary | ICD-10-CM | POA: Diagnosis not present

## 2014-08-16 DIAGNOSIS — M129 Arthropathy, unspecified: Secondary | ICD-10-CM | POA: Diagnosis not present

## 2014-08-17 DIAGNOSIS — M129 Arthropathy, unspecified: Secondary | ICD-10-CM | POA: Diagnosis not present

## 2014-08-18 DIAGNOSIS — M129 Arthropathy, unspecified: Secondary | ICD-10-CM | POA: Diagnosis not present

## 2014-08-19 DIAGNOSIS — M129 Arthropathy, unspecified: Secondary | ICD-10-CM | POA: Diagnosis not present

## 2014-08-20 DIAGNOSIS — M129 Arthropathy, unspecified: Secondary | ICD-10-CM | POA: Diagnosis not present

## 2014-08-21 DIAGNOSIS — M129 Arthropathy, unspecified: Secondary | ICD-10-CM | POA: Diagnosis not present

## 2014-08-22 DIAGNOSIS — M129 Arthropathy, unspecified: Secondary | ICD-10-CM | POA: Diagnosis not present

## 2014-08-23 DIAGNOSIS — N3000 Acute cystitis without hematuria: Secondary | ICD-10-CM | POA: Diagnosis not present

## 2014-08-23 DIAGNOSIS — N952 Postmenopausal atrophic vaginitis: Secondary | ICD-10-CM | POA: Diagnosis not present

## 2014-08-23 DIAGNOSIS — M129 Arthropathy, unspecified: Secondary | ICD-10-CM | POA: Diagnosis not present

## 2014-08-23 DIAGNOSIS — D495 Neoplasm of unspecified behavior of other genitourinary organs: Secondary | ICD-10-CM | POA: Diagnosis not present

## 2014-08-24 DIAGNOSIS — M129 Arthropathy, unspecified: Secondary | ICD-10-CM | POA: Diagnosis not present

## 2014-08-25 ENCOUNTER — Ambulatory Visit
Admission: RE | Admit: 2014-08-25 | Discharge: 2014-08-25 | Disposition: A | Discharge status: Home / Self Care | Payer: Medicare Other | Source: Ambulatory Visit | Attending: Family Medicine | Admitting: Family Medicine

## 2014-08-25 ENCOUNTER — Other Ambulatory Visit (HOSPITAL_COMMUNITY)
Admission: RE | Admit: 2014-08-25 | Discharge: 2014-08-25 | Disposition: A | Discharge status: Home / Self Care | Payer: Medicare Other | Source: Ambulatory Visit | Attending: Interventional Radiology | Admitting: Interventional Radiology

## 2014-08-25 DIAGNOSIS — E041 Nontoxic single thyroid nodule: Secondary | ICD-10-CM | POA: Diagnosis present

## 2014-08-25 DIAGNOSIS — M129 Arthropathy, unspecified: Secondary | ICD-10-CM | POA: Diagnosis not present

## 2014-08-26 DIAGNOSIS — M129 Arthropathy, unspecified: Secondary | ICD-10-CM | POA: Diagnosis not present

## 2014-08-27 DIAGNOSIS — M129 Arthropathy, unspecified: Secondary | ICD-10-CM | POA: Diagnosis not present

## 2014-08-28 DIAGNOSIS — M129 Arthropathy, unspecified: Secondary | ICD-10-CM | POA: Diagnosis not present

## 2014-08-29 DIAGNOSIS — M129 Arthropathy, unspecified: Secondary | ICD-10-CM | POA: Diagnosis not present

## 2014-08-30 ENCOUNTER — Other Ambulatory Visit: Payer: Self-pay | Admitting: Family Medicine

## 2014-08-30 DIAGNOSIS — M129 Arthropathy, unspecified: Secondary | ICD-10-CM | POA: Diagnosis not present

## 2014-08-31 DIAGNOSIS — M129 Arthropathy, unspecified: Secondary | ICD-10-CM | POA: Diagnosis not present

## 2014-08-31 NOTE — Telephone Encounter (Signed)
Can we refill these? 

## 2014-09-01 DIAGNOSIS — M129 Arthropathy, unspecified: Secondary | ICD-10-CM | POA: Diagnosis not present

## 2014-09-02 DIAGNOSIS — M129 Arthropathy, unspecified: Secondary | ICD-10-CM | POA: Diagnosis not present

## 2014-09-03 DIAGNOSIS — M129 Arthropathy, unspecified: Secondary | ICD-10-CM | POA: Diagnosis not present

## 2014-09-04 DIAGNOSIS — M129 Arthropathy, unspecified: Secondary | ICD-10-CM | POA: Diagnosis not present

## 2014-09-05 DIAGNOSIS — M129 Arthropathy, unspecified: Secondary | ICD-10-CM | POA: Diagnosis not present

## 2014-09-06 DIAGNOSIS — Z1231 Encounter for screening mammogram for malignant neoplasm of breast: Secondary | ICD-10-CM | POA: Diagnosis not present

## 2014-09-06 DIAGNOSIS — M129 Arthropathy, unspecified: Secondary | ICD-10-CM | POA: Diagnosis not present

## 2014-09-07 DIAGNOSIS — M129 Arthropathy, unspecified: Secondary | ICD-10-CM | POA: Diagnosis not present

## 2014-09-08 DIAGNOSIS — M129 Arthropathy, unspecified: Secondary | ICD-10-CM | POA: Diagnosis not present

## 2014-09-09 DIAGNOSIS — M129 Arthropathy, unspecified: Secondary | ICD-10-CM | POA: Diagnosis not present

## 2014-09-09 DIAGNOSIS — Z933 Colostomy status: Secondary | ICD-10-CM | POA: Diagnosis not present

## 2014-09-10 DIAGNOSIS — M129 Arthropathy, unspecified: Secondary | ICD-10-CM | POA: Diagnosis not present

## 2014-09-11 DIAGNOSIS — M129 Arthropathy, unspecified: Secondary | ICD-10-CM | POA: Diagnosis not present

## 2014-09-12 DIAGNOSIS — M129 Arthropathy, unspecified: Secondary | ICD-10-CM | POA: Diagnosis not present

## 2014-09-13 DIAGNOSIS — M129 Arthropathy, unspecified: Secondary | ICD-10-CM | POA: Diagnosis not present

## 2014-09-14 DIAGNOSIS — M129 Arthropathy, unspecified: Secondary | ICD-10-CM | POA: Diagnosis not present

## 2014-09-15 DIAGNOSIS — M129 Arthropathy, unspecified: Secondary | ICD-10-CM | POA: Diagnosis not present

## 2014-09-16 DIAGNOSIS — M129 Arthropathy, unspecified: Secondary | ICD-10-CM | POA: Diagnosis not present

## 2014-09-17 DIAGNOSIS — M129 Arthropathy, unspecified: Secondary | ICD-10-CM | POA: Diagnosis not present

## 2014-09-18 DIAGNOSIS — M129 Arthropathy, unspecified: Secondary | ICD-10-CM | POA: Diagnosis not present

## 2014-09-19 DIAGNOSIS — M129 Arthropathy, unspecified: Secondary | ICD-10-CM | POA: Diagnosis not present

## 2014-09-20 DIAGNOSIS — M2550 Pain in unspecified joint: Secondary | ICD-10-CM | POA: Diagnosis not present

## 2014-09-20 DIAGNOSIS — R7612 Nonspecific reaction to cell mediated immunity measurement of gamma interferon antigen response without active tuberculosis: Secondary | ICD-10-CM | POA: Diagnosis not present

## 2014-09-20 DIAGNOSIS — M129 Arthropathy, unspecified: Secondary | ICD-10-CM | POA: Diagnosis not present

## 2014-09-20 DIAGNOSIS — M1A09X0 Idiopathic chronic gout, multiple sites, without tophus (tophi): Secondary | ICD-10-CM | POA: Diagnosis not present

## 2014-09-20 DIAGNOSIS — M0589 Other rheumatoid arthritis with rheumatoid factor of multiple sites: Secondary | ICD-10-CM | POA: Diagnosis not present

## 2014-09-21 DIAGNOSIS — M129 Arthropathy, unspecified: Secondary | ICD-10-CM | POA: Diagnosis not present

## 2014-09-22 DIAGNOSIS — M129 Arthropathy, unspecified: Secondary | ICD-10-CM | POA: Diagnosis not present

## 2014-09-23 DIAGNOSIS — M129 Arthropathy, unspecified: Secondary | ICD-10-CM | POA: Diagnosis not present

## 2014-09-24 DIAGNOSIS — M129 Arthropathy, unspecified: Secondary | ICD-10-CM | POA: Diagnosis not present

## 2014-09-25 DIAGNOSIS — M129 Arthropathy, unspecified: Secondary | ICD-10-CM | POA: Diagnosis not present

## 2014-09-26 DIAGNOSIS — M129 Arthropathy, unspecified: Secondary | ICD-10-CM | POA: Diagnosis not present

## 2014-09-27 ENCOUNTER — Encounter: Payer: Self-pay | Admitting: Family Medicine

## 2014-09-27 DIAGNOSIS — M129 Arthropathy, unspecified: Secondary | ICD-10-CM | POA: Diagnosis not present

## 2014-09-28 DIAGNOSIS — M129 Arthropathy, unspecified: Secondary | ICD-10-CM | POA: Diagnosis not present

## 2014-09-29 DIAGNOSIS — M129 Arthropathy, unspecified: Secondary | ICD-10-CM | POA: Diagnosis not present

## 2014-09-30 DIAGNOSIS — M129 Arthropathy, unspecified: Secondary | ICD-10-CM | POA: Diagnosis not present

## 2014-10-01 DIAGNOSIS — M129 Arthropathy, unspecified: Secondary | ICD-10-CM | POA: Diagnosis not present

## 2014-10-02 DIAGNOSIS — M129 Arthropathy, unspecified: Secondary | ICD-10-CM | POA: Diagnosis not present

## 2014-10-03 DIAGNOSIS — M129 Arthropathy, unspecified: Secondary | ICD-10-CM | POA: Diagnosis not present

## 2014-10-04 DIAGNOSIS — M129 Arthropathy, unspecified: Secondary | ICD-10-CM | POA: Diagnosis not present

## 2014-10-05 DIAGNOSIS — M129 Arthropathy, unspecified: Secondary | ICD-10-CM | POA: Diagnosis not present

## 2014-10-06 DIAGNOSIS — M129 Arthropathy, unspecified: Secondary | ICD-10-CM | POA: Diagnosis not present

## 2014-10-07 DIAGNOSIS — M129 Arthropathy, unspecified: Secondary | ICD-10-CM | POA: Diagnosis not present

## 2014-10-08 DIAGNOSIS — M129 Arthropathy, unspecified: Secondary | ICD-10-CM | POA: Diagnosis not present

## 2014-10-09 DIAGNOSIS — M129 Arthropathy, unspecified: Secondary | ICD-10-CM | POA: Diagnosis not present

## 2014-10-10 DIAGNOSIS — M129 Arthropathy, unspecified: Secondary | ICD-10-CM | POA: Diagnosis not present

## 2014-10-10 DIAGNOSIS — Z933 Colostomy status: Secondary | ICD-10-CM | POA: Diagnosis not present

## 2014-10-11 DIAGNOSIS — M129 Arthropathy, unspecified: Secondary | ICD-10-CM | POA: Diagnosis not present

## 2014-10-12 DIAGNOSIS — M129 Arthropathy, unspecified: Secondary | ICD-10-CM | POA: Diagnosis not present

## 2014-10-13 DIAGNOSIS — M129 Arthropathy, unspecified: Secondary | ICD-10-CM | POA: Diagnosis not present

## 2014-10-14 DIAGNOSIS — M129 Arthropathy, unspecified: Secondary | ICD-10-CM | POA: Diagnosis not present

## 2014-10-15 DIAGNOSIS — M129 Arthropathy, unspecified: Secondary | ICD-10-CM | POA: Diagnosis not present

## 2014-10-16 DIAGNOSIS — M129 Arthropathy, unspecified: Secondary | ICD-10-CM | POA: Diagnosis not present

## 2014-10-17 DIAGNOSIS — M129 Arthropathy, unspecified: Secondary | ICD-10-CM | POA: Diagnosis not present

## 2014-10-18 DIAGNOSIS — M129 Arthropathy, unspecified: Secondary | ICD-10-CM | POA: Diagnosis not present

## 2014-10-19 DIAGNOSIS — M129 Arthropathy, unspecified: Secondary | ICD-10-CM | POA: Diagnosis not present

## 2014-10-20 DIAGNOSIS — M129 Arthropathy, unspecified: Secondary | ICD-10-CM | POA: Diagnosis not present

## 2014-10-21 DIAGNOSIS — M129 Arthropathy, unspecified: Secondary | ICD-10-CM | POA: Diagnosis not present

## 2014-10-22 DIAGNOSIS — M129 Arthropathy, unspecified: Secondary | ICD-10-CM | POA: Diagnosis not present

## 2014-10-23 DIAGNOSIS — M129 Arthropathy, unspecified: Secondary | ICD-10-CM | POA: Diagnosis not present

## 2014-10-24 DIAGNOSIS — M129 Arthropathy, unspecified: Secondary | ICD-10-CM | POA: Diagnosis not present

## 2014-10-25 ENCOUNTER — Other Ambulatory Visit: Payer: Self-pay | Admitting: Family Medicine

## 2014-10-25 DIAGNOSIS — M129 Arthropathy, unspecified: Secondary | ICD-10-CM | POA: Diagnosis not present

## 2014-10-26 DIAGNOSIS — M129 Arthropathy, unspecified: Secondary | ICD-10-CM | POA: Diagnosis not present

## 2014-10-27 DIAGNOSIS — M129 Arthropathy, unspecified: Secondary | ICD-10-CM | POA: Diagnosis not present

## 2014-10-28 DIAGNOSIS — M129 Arthropathy, unspecified: Secondary | ICD-10-CM | POA: Diagnosis not present

## 2014-10-29 DIAGNOSIS — M129 Arthropathy, unspecified: Secondary | ICD-10-CM | POA: Diagnosis not present

## 2014-10-30 DIAGNOSIS — M129 Arthropathy, unspecified: Secondary | ICD-10-CM | POA: Diagnosis not present

## 2014-10-31 DIAGNOSIS — M129 Arthropathy, unspecified: Secondary | ICD-10-CM | POA: Diagnosis not present

## 2014-11-01 DIAGNOSIS — M129 Arthropathy, unspecified: Secondary | ICD-10-CM | POA: Diagnosis not present

## 2014-11-02 DIAGNOSIS — M129 Arthropathy, unspecified: Secondary | ICD-10-CM | POA: Diagnosis not present

## 2014-11-03 DIAGNOSIS — M129 Arthropathy, unspecified: Secondary | ICD-10-CM | POA: Diagnosis not present

## 2014-11-04 DIAGNOSIS — M129 Arthropathy, unspecified: Secondary | ICD-10-CM | POA: Diagnosis not present

## 2014-11-05 DIAGNOSIS — M129 Arthropathy, unspecified: Secondary | ICD-10-CM | POA: Diagnosis not present

## 2014-11-06 DIAGNOSIS — M129 Arthropathy, unspecified: Secondary | ICD-10-CM | POA: Diagnosis not present

## 2014-11-07 DIAGNOSIS — M129 Arthropathy, unspecified: Secondary | ICD-10-CM | POA: Diagnosis not present

## 2014-11-08 ENCOUNTER — Telehealth: Payer: Self-pay | Admitting: Family Medicine

## 2014-11-08 DIAGNOSIS — M129 Arthropathy, unspecified: Secondary | ICD-10-CM | POA: Diagnosis not present

## 2014-11-08 DIAGNOSIS — Z933 Colostomy status: Secondary | ICD-10-CM | POA: Diagnosis not present

## 2014-11-08 DIAGNOSIS — M0589 Other rheumatoid arthritis with rheumatoid factor of multiple sites: Secondary | ICD-10-CM | POA: Diagnosis not present

## 2014-11-08 MED ORDER — HYDROCODONE-ACETAMINOPHEN 10-325 MG PO TABS: 1.0000 | ORAL_TABLET | Freq: Four times a day (QID) | ORAL | Status: DC | PRN

## 2014-11-08 NOTE — Telephone Encounter (Signed)
done

## 2014-11-08 NOTE — Telephone Encounter (Signed)
Pt request refill of the following: HYDROcodone-acetaminophen (NORCO) 10-325 MG per tablet ° ° °Phamacy: ° °

## 2014-11-09 DIAGNOSIS — M129 Arthropathy, unspecified: Secondary | ICD-10-CM | POA: Diagnosis not present

## 2014-11-09 NOTE — Telephone Encounter (Signed)
Script is ready for pick up and I left a voice message for pt. 

## 2014-11-10 DIAGNOSIS — M129 Arthropathy, unspecified: Secondary | ICD-10-CM | POA: Diagnosis not present

## 2014-11-11 DIAGNOSIS — M129 Arthropathy, unspecified: Secondary | ICD-10-CM | POA: Diagnosis not present

## 2014-11-12 DIAGNOSIS — M129 Arthropathy, unspecified: Secondary | ICD-10-CM | POA: Diagnosis not present

## 2014-11-13 DIAGNOSIS — M129 Arthropathy, unspecified: Secondary | ICD-10-CM | POA: Diagnosis not present

## 2014-11-14 DIAGNOSIS — M129 Arthropathy, unspecified: Secondary | ICD-10-CM | POA: Diagnosis not present

## 2014-11-15 DIAGNOSIS — M129 Arthropathy, unspecified: Secondary | ICD-10-CM | POA: Diagnosis not present

## 2014-11-16 ENCOUNTER — Telehealth: Payer: Self-pay | Admitting: Family Medicine

## 2014-11-16 DIAGNOSIS — M129 Arthropathy, unspecified: Secondary | ICD-10-CM | POA: Diagnosis not present

## 2014-11-16 MED ORDER — PROMETHAZINE HCL 25 MG PO TABS: 25.0000 mg | ORAL_TABLET | Freq: Four times a day (QID) | ORAL | Status: DC | PRN

## 2014-11-16 NOTE — Telephone Encounter (Signed)
Disregard the order for Flexeril. Instead call in Phenergan 25 mg every 6 hours prn nausea, #60 with 2 rf

## 2014-11-16 NOTE — Telephone Encounter (Signed)
Call in Flexeril 10 mg tid prn nausea, #60 with 2 rf

## 2014-11-16 NOTE — Telephone Encounter (Signed)
I sent script e-scribe and spoke with pt. Also pt was told that she needs a prior authorization for Zyrtec. Can you check into this for pt?

## 2014-11-16 NOTE — Telephone Encounter (Signed)
Pt has been having a lot of nausea and rn would like to know if dr fry will call something in for nausea prn .

## 2014-11-17 DIAGNOSIS — M129 Arthropathy, unspecified: Secondary | ICD-10-CM | POA: Diagnosis not present

## 2014-11-17 MED ORDER — CETIRIZINE HCL 10 MG PO TABS: 10.0000 mg | ORAL_TABLET | Freq: Every day | ORAL | Status: AC

## 2014-11-17 NOTE — Telephone Encounter (Signed)
PA request has not been received for patient. That medication is an OTC so I'm not sure if a PA will come.

## 2014-11-17 NOTE — Telephone Encounter (Signed)
I sent in new script for Zyrtec to [facility].

## 2014-11-18 DIAGNOSIS — M129 Arthropathy, unspecified: Secondary | ICD-10-CM | POA: Diagnosis not present

## 2014-11-19 DIAGNOSIS — M129 Arthropathy, unspecified: Secondary | ICD-10-CM | POA: Diagnosis not present

## 2014-11-20 DIAGNOSIS — M129 Arthropathy, unspecified: Secondary | ICD-10-CM | POA: Diagnosis not present

## 2014-11-21 DIAGNOSIS — M129 Arthropathy, unspecified: Secondary | ICD-10-CM | POA: Diagnosis not present

## 2014-11-22 ENCOUNTER — Inpatient Hospital Stay (HOSPITAL_COMMUNITY)
Admission: EM | Admit: 2014-11-22 | Discharge: 2014-11-27 | DRG: 389 | Disposition: A | Discharge status: Home / Self Care | Payer: Medicare Other | Attending: Internal Medicine | Admitting: Internal Medicine

## 2014-11-22 ENCOUNTER — Inpatient Hospital Stay (HOSPITAL_COMMUNITY): Payer: Medicare Other

## 2014-11-22 ENCOUNTER — Encounter (HOSPITAL_COMMUNITY): Payer: Self-pay | Admitting: Physical Medicine and Rehabilitation

## 2014-11-22 ENCOUNTER — Emergency Department (HOSPITAL_COMMUNITY): Payer: Medicare Other

## 2014-11-22 ENCOUNTER — Other Ambulatory Visit (HOSPITAL_COMMUNITY): Payer: Self-pay

## 2014-11-22 DIAGNOSIS — I10 Essential (primary) hypertension: Secondary | ICD-10-CM | POA: Diagnosis present

## 2014-11-22 DIAGNOSIS — E119 Type 2 diabetes mellitus without complications: Secondary | ICD-10-CM | POA: Diagnosis not present

## 2014-11-22 DIAGNOSIS — Z96653 Presence of artificial knee joint, bilateral: Secondary | ICD-10-CM | POA: Diagnosis present

## 2014-11-22 DIAGNOSIS — I5022 Chronic systolic (congestive) heart failure: Secondary | ICD-10-CM | POA: Diagnosis present

## 2014-11-22 DIAGNOSIS — Z9049 Acquired absence of other specified parts of digestive tract: Secondary | ICD-10-CM | POA: Diagnosis present

## 2014-11-22 DIAGNOSIS — I484 Atypical atrial flutter: Secondary | ICD-10-CM | POA: Diagnosis not present

## 2014-11-22 DIAGNOSIS — Z452 Encounter for adjustment and management of vascular access device: Secondary | ICD-10-CM | POA: Diagnosis not present

## 2014-11-22 DIAGNOSIS — M109 Gout, unspecified: Secondary | ICD-10-CM | POA: Diagnosis present

## 2014-11-22 DIAGNOSIS — Z4682 Encounter for fitting and adjustment of non-vascular catheter: Secondary | ICD-10-CM | POA: Diagnosis not present

## 2014-11-22 DIAGNOSIS — M1009 Idiopathic gout, multiple sites: Secondary | ICD-10-CM

## 2014-11-22 DIAGNOSIS — M1990 Unspecified osteoarthritis, unspecified site: Secondary | ICD-10-CM | POA: Diagnosis present

## 2014-11-22 DIAGNOSIS — E46 Unspecified protein-calorie malnutrition: Secondary | ICD-10-CM | POA: Diagnosis present

## 2014-11-22 DIAGNOSIS — H548 Legal blindness, as defined in USA: Secondary | ICD-10-CM | POA: Diagnosis present

## 2014-11-22 DIAGNOSIS — N2889 Other specified disorders of kidney and ureter: Secondary | ICD-10-CM | POA: Diagnosis present

## 2014-11-22 DIAGNOSIS — Z8719 Personal history of other diseases of the digestive system: Secondary | ICD-10-CM

## 2014-11-22 DIAGNOSIS — Z933 Colostomy status: Secondary | ICD-10-CM | POA: Diagnosis not present

## 2014-11-22 DIAGNOSIS — M069 Rheumatoid arthritis, unspecified: Secondary | ICD-10-CM | POA: Diagnosis not present

## 2014-11-22 DIAGNOSIS — Z8673 Personal history of transient ischemic attack (TIA), and cerebral infarction without residual deficits: Secondary | ICD-10-CM

## 2014-11-22 DIAGNOSIS — K565 Intestinal adhesions [bands] with obstruction (postprocedural) (postinfection): Principal | ICD-10-CM | POA: Diagnosis present

## 2014-11-22 DIAGNOSIS — K439 Ventral hernia without obstruction or gangrene: Secondary | ICD-10-CM | POA: Diagnosis present

## 2014-11-22 DIAGNOSIS — R1084 Generalized abdominal pain: Secondary | ICD-10-CM | POA: Diagnosis not present

## 2014-11-22 DIAGNOSIS — K5669 Other intestinal obstruction: Secondary | ICD-10-CM | POA: Diagnosis not present

## 2014-11-22 DIAGNOSIS — K2970 Gastritis, unspecified, without bleeding: Secondary | ICD-10-CM | POA: Diagnosis not present

## 2014-11-22 DIAGNOSIS — K7689 Other specified diseases of liver: Secondary | ICD-10-CM | POA: Diagnosis not present

## 2014-11-22 DIAGNOSIS — I340 Nonrheumatic mitral (valve) insufficiency: Secondary | ICD-10-CM | POA: Diagnosis not present

## 2014-11-22 DIAGNOSIS — Q8740 Marfan's syndrome, unspecified: Secondary | ICD-10-CM

## 2014-11-22 DIAGNOSIS — M810 Age-related osteoporosis without current pathological fracture: Secondary | ICD-10-CM | POA: Diagnosis not present

## 2014-11-22 DIAGNOSIS — R10819 Abdominal tenderness, unspecified site: Secondary | ICD-10-CM | POA: Diagnosis not present

## 2014-11-22 DIAGNOSIS — M1000 Idiopathic gout, unspecified site: Secondary | ICD-10-CM | POA: Diagnosis present

## 2014-11-22 DIAGNOSIS — M1A9XX0 Chronic gout, unspecified, without tophus (tophi): Secondary | ICD-10-CM | POA: Diagnosis not present

## 2014-11-22 DIAGNOSIS — Z87891 Personal history of nicotine dependence: Secondary | ICD-10-CM

## 2014-11-22 DIAGNOSIS — K56609 Unspecified intestinal obstruction, unspecified as to partial versus complete obstruction: Secondary | ICD-10-CM | POA: Diagnosis present

## 2014-11-22 DIAGNOSIS — R112 Nausea with vomiting, unspecified: Secondary | ICD-10-CM | POA: Diagnosis not present

## 2014-11-22 DIAGNOSIS — R109 Unspecified abdominal pain: Secondary | ICD-10-CM | POA: Diagnosis not present

## 2014-11-22 DIAGNOSIS — M419 Scoliosis, unspecified: Secondary | ICD-10-CM | POA: Diagnosis not present

## 2014-11-22 DIAGNOSIS — I4891 Unspecified atrial fibrillation: Secondary | ICD-10-CM | POA: Diagnosis present

## 2014-11-22 DIAGNOSIS — Z681 Body mass index (BMI) 19 or less, adult: Secondary | ICD-10-CM

## 2014-11-22 DIAGNOSIS — H540 Blindness, both eyes: Secondary | ICD-10-CM | POA: Diagnosis present

## 2014-11-22 DIAGNOSIS — I4892 Unspecified atrial flutter: Secondary | ICD-10-CM | POA: Diagnosis not present

## 2014-11-22 DIAGNOSIS — K5660 Unspecified intestinal obstruction: Secondary | ICD-10-CM | POA: Diagnosis not present

## 2014-11-22 LAB — URINALYSIS, ROUTINE W REFLEX MICROSCOPIC
Bilirubin Urine: NEGATIVE
Glucose, UA: NEGATIVE mg/dL
Hgb urine dipstick: NEGATIVE
Ketones, ur: 15 mg/dL — AB
Nitrite: NEGATIVE
Protein, ur: NEGATIVE mg/dL
Specific Gravity, Urine: 1.017 (ref 1.005–1.030)
Urobilinogen, UA: 0.2 mg/dL (ref 0.0–1.0)
pH: 7 (ref 5.0–8.0)

## 2014-11-22 LAB — COMPREHENSIVE METABOLIC PANEL
ALT: 14 U/L (ref 14–54)
AST: 20 U/L (ref 15–41)
Albumin: 4 g/dL (ref 3.5–5.0)
Alkaline Phosphatase: 91 U/L (ref 38–126)
Anion gap: 9 (ref 5–15)
BUN: 12 mg/dL (ref 6–20)
CO2: 27 mmol/L (ref 22–32)
Calcium: 9.2 mg/dL (ref 8.9–10.3)
Chloride: 103 mmol/L (ref 101–111)
Creatinine, Ser: 0.89 mg/dL (ref 0.44–1.00)
GFR calc Af Amer: >60 mL/min (ref >60)
GFR calc non Af Amer: >60 mL/min (ref >60)
Glucose, Bld: 104 mg/dL — ABNORMAL HIGH (ref 65–99)
Potassium: 3.5 mmol/L (ref 3.5–5.1)
Sodium: 139 mmol/L (ref 135–145)
Total Bilirubin: 0.7 mg/dL (ref 0.3–1.2)
Total Protein: 7.2 g/dL (ref 6.5–8.1)

## 2014-11-22 LAB — CBC WITH DIFFERENTIAL/PLATELET
Basophils Absolute: 0 10*3/uL (ref 0.0–0.1)
Basophils Relative: 0 % (ref 0–1)
Eosinophils Absolute: 0.1 10*3/uL (ref 0.0–0.7)
Eosinophils Relative: 1 % (ref 0–5)
HCT: 37.6 % (ref 36.0–46.0)
Hemoglobin: 12.5 g/dL (ref 12.0–15.0)
Lymphocytes Relative: 20 % (ref 12–46)
Lymphs Abs: 2.1 10*3/uL (ref 0.7–4.0)
MCH: 30.8 pg (ref 26.0–34.0)
MCHC: 33.2 g/dL (ref 30.0–36.0)
MCV: 92.6 fL (ref 78.0–100.0)
Monocytes Absolute: 0.6 10*3/uL (ref 0.1–1.0)
Monocytes Relative: 5 % (ref 3–12)
Neutro Abs: 8 10*3/uL — ABNORMAL HIGH (ref 1.7–7.7)
Neutrophils Relative %: 74 % (ref 43–77)
Platelets: 258 10*3/uL (ref 150–400)
RBC: 4.06 MIL/uL (ref 3.87–5.11)
RDW: 14 % (ref 11.5–15.5)
WBC: 10.8 10*3/uL — ABNORMAL HIGH (ref 4.0–10.5)

## 2014-11-22 LAB — URINE MICROSCOPIC-ADD ON

## 2014-11-22 LAB — GLUCOSE, CAPILLARY: Glucose-Capillary: 92 mg/dL (ref 65–99)

## 2014-11-22 LAB — LIPASE, BLOOD: Lipase: 27 U/L (ref 22–51)

## 2014-11-22 MED ORDER — METOPROLOL TARTRATE 1 MG/ML IV SOLN
5.0000 mg | Freq: Four times a day (QID) | INTRAVENOUS | Status: DC
  Administered 2014-11-22 – 2014-11-26 (×15): 5 mg via INTRAVENOUS
  Filled 2014-11-22 (×18): qty 5

## 2014-11-22 MED ORDER — SODIUM CHLORIDE 0.9 % IV SOLN
INTRAVENOUS | Status: DC
  Administered 2014-11-22 – 2014-11-26 (×6): via INTRAVENOUS

## 2014-11-22 MED ORDER — ALBUTEROL SULFATE (2.5 MG/3ML) 0.083% IN NEBU: 2.5000 mg | INHALATION_SOLUTION | RESPIRATORY_TRACT | Status: DC | PRN

## 2014-11-22 MED ORDER — IOHEXOL 300 MG/ML  SOLN
80.0000 mL | Freq: Once | INTRAMUSCULAR | Status: AC | PRN
  Administered 2014-11-22: 80 mL via INTRAVENOUS

## 2014-11-22 MED ORDER — MORPHINE SULFATE 2 MG/ML IJ SOLN
2.0000 mg | INTRAMUSCULAR | Status: DC | PRN
  Administered 2014-11-22 – 2014-11-27 (×19): 2 mg via INTRAVENOUS
  Filled 2014-11-22 (×19): qty 1

## 2014-11-22 MED ORDER — ONDANSETRON HCL 4 MG/2ML IJ SOLN
4.0000 mg | Freq: Once | INTRAMUSCULAR | Status: AC
  Administered 2014-11-22: 4 mg via INTRAVENOUS
  Filled 2014-11-22: qty 2

## 2014-11-22 MED ORDER — ONDANSETRON HCL 4 MG PO TABS: 4.0000 mg | ORAL_TABLET | Freq: Four times a day (QID) | ORAL | Status: DC | PRN

## 2014-11-22 MED ORDER — IOHEXOL 300 MG/ML  SOLN: 25.0000 mL | Freq: Once | INTRAMUSCULAR | Status: AC | PRN

## 2014-11-22 MED ORDER — ACETAMINOPHEN 325 MG PO TABS: 650.0000 mg | ORAL_TABLET | Freq: Four times a day (QID) | ORAL | Status: DC | PRN

## 2014-11-22 MED ORDER — HEPARIN SODIUM (PORCINE) 5000 UNIT/ML IJ SOLN
5000.0000 [IU] | Freq: Three times a day (TID) | INTRAMUSCULAR | Status: DC
  Administered 2014-11-22 – 2014-11-27 (×16): 5000 [IU] via SUBCUTANEOUS
  Filled 2014-11-22 (×18): qty 1

## 2014-11-22 MED ORDER — MORPHINE SULFATE 4 MG/ML IJ SOLN
4.0000 mg | Freq: Once | INTRAMUSCULAR | Status: AC
  Administered 2014-11-22: 4 mg via INTRAVENOUS
  Filled 2014-11-22: qty 1

## 2014-11-22 MED ORDER — ACETAMINOPHEN 650 MG RE SUPP: 650.0000 mg | Freq: Four times a day (QID) | RECTAL | Status: DC | PRN

## 2014-11-22 MED ORDER — POLYETHYLENE GLYCOL 3350 17 G PO PACK
17.0000 g | PACK | Freq: Every day | ORAL | Status: DC | PRN
  Filled 2014-11-22: qty 1

## 2014-11-22 MED ORDER — ALLOPURINOL 100 MG PO TABS
100.0000 mg | ORAL_TABLET | Freq: Every day | ORAL | Status: DC
  Administered 2014-11-24 – 2014-11-27 (×4): 100 mg via ORAL
  Filled 2014-11-22 (×6): qty 1

## 2014-11-22 MED ORDER — SODIUM CHLORIDE 0.9 % IV BOLUS (SEPSIS)
1000.0000 mL | Freq: Once | INTRAVENOUS | Status: AC
  Administered 2014-11-22: 1000 mL via INTRAVENOUS

## 2014-11-22 MED ORDER — ONDANSETRON HCL 4 MG/2ML IJ SOLN
4.0000 mg | Freq: Four times a day (QID) | INTRAMUSCULAR | Status: DC | PRN
  Administered 2014-11-22 (×2): 4 mg via INTRAVENOUS
  Filled 2014-11-22 (×2): qty 2

## 2014-11-22 MED ORDER — BISACODYL 10 MG RE SUPP: 10.0000 mg | Freq: Every day | RECTAL | Status: DC | PRN

## 2014-11-22 NOTE — ED Notes (Signed)
Pt reports diffuse abdominal pain, nausea and vomiting for several days. 8/10 pain at the time. Abdomen soft and non tender to palpation. Bowel sounds present all quadrants. Pt is alert and oriented x4.

## 2014-11-22 NOTE — ED Provider Notes (Signed)
CSN: [id number]     Arrival date & time 11/22/14  0801 History   First MD Initiated Contact with Patient 11/22/14 0805     Chief Complaint  Patient presents with  . Abdominal Pain  . Emesis     (Consider location/radiation/quality/duration/timing/severity/associated sxs/prior Treatment) HPI Comments: 59 year old female with significant past medical history for multiple abdominal surgeries, obstructions and perforated diverticulitis now with colostomy comes with abdominal pain. Patient states she woke up this morning at approximately 4 AM with diffuse abdominal pain. Reports nausea with one episode of vomiting. Nonbilious nonbloody. Tried taking tramadol without help. Patient still having output from her colostomy.  Patient is a 59 y.o. female presenting with abdominal pain and vomiting.  Abdominal Pain Associated symptoms: nausea and vomiting   Associated symptoms: no chest pain, no fever and no shortness of breath   Emesis Associated symptoms: abdominal pain   Associated symptoms: no headaches     Past Medical History  Diagnosis Date  . Legally blind     since pt was a teenager  . Hearing aid worn     pt wears bilateral hearing aids  . Marfan's syndrome affecting skin     with scolosis  . Osteoporosis   . Hypertension   . Substance abuse     recovering alcoholic Y65 years   . Hernia 4/08  . 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 Chronic gout 2008  . Stroke 2/08  . Fibroid   . Status Jshaun Abernathy bunionectomy 1/10    bilateral   . Diverticulitis of large intestine with perforation   . Small bowel obstruction due to adhesions   . Rheumatoid arthritis     sees Dr. Gavin Pound   . SBO (small bowel obstruction) 01/05/2013  . Atrial flutter    Past Surgical History  Procedure Laterality Date  . Bunionectomy Bilateral 1/10  . Total knee arthroplasty Bilateral 6/08  . Hernia repair    . Laparotomy N/A 11/27/2012    Procedure: EXPLORATORY LAPAROTOMY SIGMOID  COLECTOMY, COLOSTOMY;  Surgeon: Madilyn Hook, DO;  Location: [facility];  Service: General;  Laterality: N/A;  . Colostomy  11/27/2012  . Laparoscopic abdominal exploration N/A 01/06/2013    Procedure: LAPAROSCOPIC ABDOMINAL EXPLORATION;  Surgeon: Adin Hector, MD;  Location: [facility];  Service: General;  Laterality: N/A;  . Laparoscopic lysis of adhesions N/A 01/06/2013    Procedure: LAPAROSCOPIC LYSIS OF ADHESIONS/ INTEROTOMY REPAIR;  Surgeon: Adin Hector, MD;  Location: [facility];  Service: General;  Laterality: N/A;  . Colon surgery      colectomy  . Ablation  01/04/14    atrial flutter ablation (2 circuits) by Dr Lovena Le  . Atrial flutter ablation N/A 01/04/2014    Procedure: ATRIAL FLUTTER ABLATION;  Surgeon: Evans Lance, MD;  Location: [facility] CATH LAB;  Service: Cardiovascular;  Laterality: N/A;   Family History  Problem Relation Age of Onset  . Heart attack Neg Hx    History  Substance Use Topics  . Smoking status: Former Smoker -- 1.00 packs/day    Types: Cigarettes    Quit date: 11/27/2012  . Smokeless tobacco: Never Used  . Alcohol Use: No     Comment: recovering alcoholic sober since 0354   OB History    Gravida Para Term Preterm AB TAB SAB Ectopic Multiple Living   1 1 1       1      Review of Systems  Constitutional: Negative for fever.  HENT: Negative for congestion.   Respiratory: Negative for shortness of breath.   Cardiovascular: Negative for chest pain.  Gastrointestinal: Positive for nausea, vomiting and abdominal pain.  Musculoskeletal: Negative for back pain.  Skin: Negative for rash.  Neurological: Negative for headaches.  Psychiatric/Behavioral: Negative for confusion.  All other systems reviewed and are negative.     Allergies  Review of patient's allergies indicates no known allergies.  Home Medications   Prior to Admission medications   Medication Sig Start Date End Date Taking? Authorizing Provider  albuterol (PROVENTIL HFA;VENTOLIN HFA) 108 (90  BASE) MCG/ACT inhaler Inhale 1 puff into the lungs every 4 (four) hours as needed for wheezing or shortness of breath. 12/29/13  Yes [name], MD  allopurinol (ZYLOPRIM) 100 MG tablet take 1 tablet by mouth once daily 10/25/14  Yes [name], MD  amLODipine (NORVASC) 10 MG tablet take 1 tablet by mouth once daily 10/25/14  Yes [name], MD  aspirin EC 81 MG tablet Take 1 tablet (81 mg total) by mouth daily. 05/11/14  Yes Verlee Monte, MD  Calcium Carbonate-Vitamin D (CALCIUM + D PO) Take 1,200 mg by mouth daily.   Yes Historical Provider, MD  cetirizine (ZYRTEC) 10 MG tablet Take 1 tablet (10 mg total) by mouth daily. 11/17/14  Yes [name], MD  ciprofloxacin-dexamethasone [facility]) otic suspension Place 4 drops into both ears 2 (two) times daily. Patient taking differently: Place 4 drops into both ears 2 (two) times daily as needed.  05/18/14  Yes [name], MD  Cyanocobalamin (VITAMIN B 12 PO) Take 1 tablet by mouth daily.   Yes Historical Provider, MD  folic acid (FOLVITE) 1 MG tablet Take 1 mg by mouth daily.   Yes Historical Provider, MD  HYDROcodone-acetaminophen (NORCO) 10-325 MG per tablet Take 1 tablet by mouth every 6 (six) hours as needed for moderate pain. 11/08/14  Yes [name], MD  ketoconazole (NIZORAL) 2 % cream Apply 1 application topically 2 (two) times daily. 08/10/13  Yes [name], MD  MEGACE ES 625 MG/5ML suspension TAKE 5 ML'S BY MOUTH TWICE DAILY Patient taking differently: TAKE 5 ML'S BY MOUTH TWICE DAILY AS NEEDED 08/31/14  Yes [name], MD  methotrexate 25 MG/ML injection Inject 80 mg into the skin once a week. Take on Mondays   Yes Historical Provider, MD  Multiple Vitamins-Minerals (MULTIVITAMIN PO) Take 1 tablet by mouth daily.    Yes Historical Provider, MD  NEXIUM 40 MG capsule take 1 capsule by mouth twice a day 08/31/14  Yes [name], MD  polyethylene glycol [facility] / GLYCOLAX) packet Take 17 g by mouth daily. Patient taking  differently: Take 17 g by mouth daily as needed for mild constipation.  05/11/14  Yes Verlee Monte, MD  pyridOXINE (VITAMIN B-6) 50 MG tablet Take 50 mg by mouth daily.   Yes Historical Provider, MD  risedronate (ACTONEL) 150 MG tablet Take 150 mg by mouth every 30 (thirty) days. with water on empty stomach, nothing by mouth or lie down for next 30 minutes.   Yes Historical Provider, MD  tiZANidine (ZANAFLEX) 4 MG tablet Take 1 tablet (4 mg total) by mouth every 6 (six) hours as needed for muscle spasms. 08/12/14  Yes [name], MD  traMADol Veatrice Bourbon) 50 MG tablet take 1 to 2 tablets by mouth every 6 hours if needed for pain 05/03/14  Yes [name], MD  vitamin C (ASCORBIC ACID) 500  MG tablet Take 500 mg by mouth daily.   Yes Historical Provider, MD  EPINEPHrine (EPIPEN 2-PAK) 0.3 mg/0.3 mL IJ SOAJ injection Inject 0.3 mLs (0.3 mg total) into the muscle once. Patient not taking: Reported on 11/22/2014 02/01/14   [name], MD  promethazine (PHENERGAN) 25 MG tablet Take 1 tablet (25 mg total) by mouth every 6 (six) hours as needed for nausea or vomiting. 11/16/14   [name], MD   BP 177/85 mmHg  Pulse 104  Temp(Src) 99 F (37.2 C) (Oral)  Resp 20  SpO2 100%  LMP 06/25/2006 (LMP Unknown) Physical Exam  Constitutional: She is oriented to person, place, and time. She appears well-developed.  HENT:  Head: Normocephalic.  Eyes:  History of blindness right eye, noted scarring of this eye. Able to track with left eye. No acute changes area at baseline per her report  Neck: Normal range of motion.  Cardiovascular: Normal rate.   Murmur heard. Pulmonary/Chest: Effort normal. No respiratory distress.  Abdominal: Soft. There is tenderness.  Patient with colostomy in place with output noted. Patient also with a large ventral hernia which is reducible. Some diffuse tenderness palpation throughout without focality. No peritonitis.  Musculoskeletal: She exhibits no tenderness.  Neurological:  She is alert and oriented to person, place, and time. No cranial nerve deficit. She exhibits normal muscle tone.  Skin: Skin is warm.  Psychiatric: She has a normal mood and affect.  Vitals reviewed.   ED Course  Procedures (including critical care time) Labs Review Labs Reviewed  CBC WITH DIFFERENTIAL/PLATELET - Abnormal; Notable for the following:    WBC 10.8 (*)    Neutro Abs 8.0 (*)    All other components within normal limits  COMPREHENSIVE METABOLIC PANEL - Abnormal; Notable for the following:    Glucose, Bld 104 (*)    All other components within normal limits  URINALYSIS, ROUTINE W REFLEX MICROSCOPIC (NOT AT [facility]) - Abnormal; Notable for the following:    APPearance CLOUDY (*)    Ketones, ur 15 (*)    Leukocytes, UA SMALL (*)    All other components within normal limits  URINE MICROSCOPIC-ADD ON - Abnormal; Notable for the following:    Squamous Epithelial / LPF MANY (*)    Bacteria, UA FEW (*)    Casts HYALINE CASTS (*)    All other components within normal limits  LIPASE, BLOOD    Imaging Review Ct Abdomen Pelvis W Contrast  11/22/2014   CLINICAL DATA:  Diffuse abdominal pain.  Colostomy.  EXAM: CT ABDOMEN AND PELVIS WITH CONTRAST  TECHNIQUE: Multidetector CT imaging of the abdomen and pelvis was performed using the standard protocol following bolus administration of intravenous contrast.  CONTRAST:  45mL OMNIPAQUE IOHEXOL 300 MG/ML  SOLN  COMPARISON:  05/01/2014  FINDINGS: Lower chest: There is no pericardial effusion. Calcified atherosclerotic disease and involves the thoracic and abdominal aorta as well as the LAD and left circumflex coronary artery.  Hepatobiliary: There is a cyst within the left lobe of liver measuring 2.2 cm, image 24 of series 201. Several smaller sister noted within the left lobe. The gallbladder is normal. The common bile duct has a maximum diameter of 8 mm. Unchanged from previous exam.  Pancreas: There is increase caliber of the distal pancreatic  duct, unchanged from previous exam measuring up to 7 mm. No mass or obstructing stone identified.  Spleen: Negative  Adrenals/Urinary Tract: The adrenal glands are negative. The right kidney appears normal. Hyperdense lesion arising  from the posterior right kidney measures 1.7 cm. This remains concerning for a small renal cell carcinoma. The urinary bladder appears normal.  Stomach/Bowel: The stomach is normal. The small bowel loops are increased in caliber measuring up to 3.5 cm, image 51 of series 201. There is a transition to decreased caliber mid and distal small bowel loops within the left abdomen, image number 39 of series 202 and image number 50 of series 201. There is gas and stool identified within the colon. The patient has a left sided colostomy.  Vascular/Lymphatic: Calcified atherosclerotic disease involves the abdominal aorta. No aneurysm. No enlarged retroperitoneal or mesenteric adenopathy. No enlarged pelvic or inguinal lymph nodes.  Reproductive: The uterus and the adnexal structures are unremarkable.  Other: No free fluid or fluid collections identified within the abdomen or pelvis.  Musculoskeletal: Scoliosis deformity involves the thoracic and lumbar spine. There is multi level degenerative disc disease identified throughout the thoracic and lumbar spine.  IMPRESSION: 1. Examination is positive for small bowel obstruction. The transition point is in the left abdomen likely at the level of the mid jejunum or proximal ileum. This may be secondary to adhesions or stricture. No perforation or abscess. 2. Left lower quadrant colostomy. 3. Liver cysts. 4. Hyperdense lesion involving the posterior left kidney compatible with previously demonstrated solid enhancing mass. This is worrisome for renal cell carcinoma.   Electronically Signed   By: Kerby Moors M.D.   On: 11/22/2014 10:14     EKG Interpretation   Date/Time:  Monday Nov 22 2014 08:10:25 EDT Ventricular Rate:  99 PR Interval:   220 QRS Duration: 77 QT Interval:  355 QTC Calculation: 456 R Axis:   2 Text Interpretation:  Sinus tachycardia Atrial premature complexes  Prolonged PR interval Abnormal R-wave progression, early transition  Consider left ventricular hypertrophy No significant change since last  tracing Confirmed by Maryan Rued  MD, Loree Fee ([id number]) on 11/22/2014 9:05:38 AM      MDM  59 year old female hemodynamically stable. Present with diffuse abdominal pain as well as nausea and vomiting. On exam patient has a moderately tender abdomen in all quadrants. She does have a noted large hernia. This is reducible. Patient does have an ostomy which has some watery output. No formed or loose stools noted. Basic labs and imaging obtained. Imaging showed a SBO transition point at the mid jejunum. Remainder of labs shows slight leukocytosis. Consult to general surgery who will see the patient. However secondary to multiple medical problems will admit with the hospitalist service for primary medical management. He remained stable without further issues   Final diagnoses:  SBO (small bowel obstruction)       Robynn Pane, MD 11/22/14 [place], MD 11/24/14 2200

## 2014-11-22 NOTE — ED Notes (Signed)
Admitting physicians at bedside. NAD.

## 2014-11-22 NOTE — ED Notes (Signed)
Pt drinking oral contrast at the time. 

## 2014-11-22 NOTE — H&P (Signed)
Patient Demographics  [name], is a 59 y.o. female  MRN: [id number]   DOB - [birth year]  Admit Date - 11/22/2014  Outpatient Primary MD for the patient is [name], MD   With History of -  Past Medical History  Diagnosis Date  . Legally blind     since pt was a teenager  . Hearing aid worn     pt wears bilateral hearing aids  . Marfan's syndrome affecting skin     with scolosis  . Osteoporosis   . Hypertension   . Substance abuse     recovering alcoholic R51 years   . Hernia 4/08  . 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 Chronic gout 2008  . Stroke 2/08  . Fibroid   . Status post bunionectomy 1/10    bilateral   . Diverticulitis of large intestine with perforation   . Small bowel obstruction due to adhesions   . Rheumatoid arthritis     sees Dr. Gavin Pound   . SBO (small bowel obstruction) 01/05/2013  . Atrial flutter       Past Surgical History  Procedure Laterality Date  . Bunionectomy Bilateral 1/10  . Total knee arthroplasty Bilateral 6/08  . Hernia repair    . Laparotomy N/A 11/27/2012    Procedure: EXPLORATORY LAPAROTOMY SIGMOID COLECTOMY, COLOSTOMY;  Surgeon: Madilyn Hook, DO;  Location: [facility];  Service: General;  Laterality: N/A;  . Colostomy  11/27/2012  . Laparoscopic abdominal exploration N/A 01/06/2013    Procedure: LAPAROSCOPIC ABDOMINAL EXPLORATION;  Surgeon: Adin Hector, MD;  Location: [facility];  Service: General;  Laterality: N/A;  . Laparoscopic lysis of adhesions N/A 01/06/2013    Procedure: LAPAROSCOPIC LYSIS OF ADHESIONS/ INTEROTOMY REPAIR;  Surgeon: Adin Hector, MD;  Location: [facility];  Service: General;  Laterality: N/A;  . Colon surgery      colectomy  . Ablation  01/04/14    atrial flutter ablation (2 circuits) by Dr Lovena Le  . Atrial flutter ablation N/A 01/04/2014    Procedure: ATRIAL FLUTTER ABLATION;  Surgeon: Evans Lance, MD;  Location: [facility] CATH LAB;  Service: Cardiovascular;  Laterality: N/A;    in  for   Chief Complaint  Patient presents with  . Abdominal Pain  . Emesis     HPI  [name]  is a 59 y.o. female,  past medical history of Legally blind; Hearing aid worn; Marfan's syndrome affecting skin; Osteoporosis; Hypertension; Substance abuse; Hernia (4/08); Total knee replacement status (6/08); Chronic gout (2008); Stroke (2/08); Fibroid; Status post bunionectomy (1/10); Diverticulitis of large intestine with perforation; Small bowel obstruction due to adhesions; Rheumatoid arthritis; SBO (small bowel obstruction) (01/05/2013); and Atrial flutter with ablation 8/16. Patient presents with complaints of abdominal pain, nausea and vomiting, patient has history of small bowel obstruction in the past, most recent of November 2015, which was managed conservatively with NG tube on bowel rest,Patient with history of multiple abdominal surgeries, has colostomy, start abdominal pain started this a.m. when she woke up, has nausea, one episode of vomiting, nonbilious, nonbloody, in ED CT abdomen and pelvis showing small bowel obstruction,Specialists requested to admit the patient..    Review of Systems    In addition to the HPI above,  No Fever-chills, No Headache, No changes with Vision or hearing, patient is legally blind, wearing hearing aids No problems swallowing food or Liquids, No Chest pain, Cough or Shortness of Breath, reports abdominal pain, nausea  and vomiting. No Blood in stool or Urine, No dysuria, No new skin rashes or bruises, No new joints pains-aches,  No new weakness, tingling, numbness in any extremity, No recent weight gain or loss, No polyuria, polydypsia or polyphagia, No significant Mental Stressors.  A full 10 point Review of Systems was done, except as stated above, all other Review of Systems were negative.   Social History History  Substance Use Topics  . Smoking status: Former Smoker -- 1.00 packs/day    Types: Cigarettes    Quit date: 11/27/2012    . Smokeless tobacco: Never Used  . Alcohol Use: No     Comment: recovering alcoholic sober since 2947     Family History Family History  Problem Relation Age of Onset  . Heart attack Neg Hx      Prior to Admission medications   Medication Sig Start Date End Date Taking? Authorizing Provider  albuterol (PROVENTIL HFA;VENTOLIN HFA) 108 (90 BASE) MCG/ACT inhaler Inhale 1 puff into the lungs every 4 (four) hours as needed for wheezing or shortness of breath. 12/29/13  Yes [name], MD  allopurinol (ZYLOPRIM) 100 MG tablet take 1 tablet by mouth once daily 10/25/14  Yes [name], MD  amLODipine (NORVASC) 10 MG tablet take 1 tablet by mouth once daily 10/25/14  Yes [name], MD  aspirin EC 81 MG tablet Take 1 tablet (81 mg total) by mouth daily. 05/11/14  Yes Verlee Monte, MD  Calcium Carbonate-Vitamin D (CALCIUM + D PO) Take 1,200 mg by mouth daily.   Yes Historical Provider, MD  cetirizine (ZYRTEC) 10 MG tablet Take 1 tablet (10 mg total) by mouth daily. 11/17/14  Yes [name], MD  ciprofloxacin-dexamethasone [facility]) otic suspension Place 4 drops into both ears 2 (two) times daily. Patient taking differently: Place 4 drops into both ears 2 (two) times daily as needed.  05/18/14  Yes [name], MD  Cyanocobalamin (VITAMIN B 12 PO) Take 1 tablet by mouth daily.   Yes Historical Provider, MD  folic acid (FOLVITE) 1 MG tablet Take 1 mg by mouth daily.   Yes Historical Provider, MD  HYDROcodone-acetaminophen (NORCO) 10-325 MG per tablet Take 1 tablet by mouth every 6 (six) hours as needed for moderate pain. 11/08/14  Yes [name], MD  ketoconazole (NIZORAL) 2 % cream Apply 1 application topically 2 (two) times daily. 08/10/13  Yes [name], MD  MEGACE ES 625 MG/5ML suspension TAKE 5 ML'S BY MOUTH TWICE DAILY Patient taking differently: TAKE 5 ML'S BY MOUTH TWICE DAILY AS NEEDED 08/31/14  Yes [name], MD  methotrexate 25 MG/ML injection Inject 80 mg into the skin  once a week. Take on Mondays   Yes Historical Provider, MD  Multiple Vitamins-Minerals (MULTIVITAMIN PO) Take 1 tablet by mouth daily.    Yes Historical Provider, MD  NEXIUM 40 MG capsule take 1 capsule by mouth twice a day 08/31/14  Yes [name], MD  polyethylene glycol [facility] / GLYCOLAX) packet Take 17 g by mouth daily. Patient taking differently: Take 17 g by mouth daily as needed for mild constipation.  05/11/14  Yes Verlee Monte, MD  pyridOXINE (VITAMIN B-6) 50 MG tablet Take 50 mg by mouth daily.   Yes Historical Provider, MD  risedronate (ACTONEL) 150 MG tablet Take 150 mg by mouth every 30 (thirty) days. with water on empty stomach, nothing by mouth or lie down for next 30 minutes.   Yes Historical  Provider, MD  tiZANidine (ZANAFLEX) 4 MG tablet Take 1 tablet (4 mg total) by mouth every 6 (six) hours as needed for muscle spasms. 08/12/14  Yes [name], MD  traMADol Veatrice Bourbon) 50 MG tablet take 1 to 2 tablets by mouth every 6 hours if needed for pain 05/03/14  Yes [name], MD  vitamin C (ASCORBIC ACID) 500 MG tablet Take 500 mg by mouth daily.   Yes Historical Provider, MD  EPINEPHrine (EPIPEN 2-PAK) 0.3 mg/0.3 mL IJ SOAJ injection Inject 0.3 mLs (0.3 mg total) into the muscle once. Patient not taking: Reported on 11/22/2014 02/01/14   [name], MD  promethazine (PHENERGAN) 25 MG tablet Take 1 tablet (25 mg total) by mouth every 6 (six) hours as needed for nausea or vomiting. 11/16/14   [name], MD    No Known Allergies  Physical Exam  Vitals  Blood pressure 177/85, pulse 104, temperature 99 F (37.2 C), temperature source Oral, resp. rate 20, last menstrual period 06/25/2006, SpO2 100 %.   1. General Frail, chronically ill-appearing female lying in bed in NAD,    2. Normal affect and insight, Not Suicidal or Homicidal, Awake Alert, Oriented X 3.  3.legally blind, C.Nerves IntactMohs all extremities without significant deficits  4. wear hearing aids ,Moist  Oral Mucosa.  5. Supple Neck, No JVD, No cervical lymphadenopathy appriciated, No Carotid Bruits.  6. Symmetrical Chest wall movement, Good air movement bilaterally, CTAB.  7. RRR, No Gallops, Rubs or Murmurs, No Parasternal Heave.  8. Positive Bowel Sounds, Abdomen Soft, No tenderness, colostomy bag in place, was some liquid stools and back, abdominal wall hernia, reducible, nontender, No organomegaly appriciated,No rebound -guarding or rigidity.  9.  No Cyanosis, Normal Skin Turgor, No Skin Rash or Bruise.  10. severe hand joint deformities secondary to rheumatoid arthritis.  11. No Palpable Lymph Nodes in Neck or Axillae    Data Review  CBC  Recent Labs Lab 11/22/14 0835  WBC 10.8*  HGB 12.5  HCT 37.6  PLT 258  MCV 92.6  MCH 30.8  MCHC 33.2  RDW 14.0  LYMPHSABS 2.1  MONOABS 0.6  EOSABS 0.1  BASOSABS 0.0   ------------------------------------------------------------------------------------------------------------------  Chemistries   Recent Labs Lab 11/22/14 0835  NA 139  K 3.5  CL 103  CO2 27  GLUCOSE 104*  BUN 12  CREATININE 0.89  CALCIUM 9.2  AST 20  ALT 14  ALKPHOS 91  BILITOT 0.7   ------------------------------------------------------------------------------------------------------------------ CrCl cannot be calculated (Unknown ideal weight.). ------------------------------------------------------------------------------------------------------------------ No results for input(s): TSH, T4TOTAL, T3FREE, THYROIDAB in the last 72 hours.  Invalid input(s): FREET3   Coagulation profile No results for input(s): INR, PROTIME in the last 168 hours. ------------------------------------------------------------------------------------------------------------------- No results for input(s): DDIMER in the last 72 hours. -------------------------------------------------------------------------------------------------------------------  Cardiac  Enzymes No results for input(s): CKMB, TROPONINI, MYOGLOBIN in the last 168 hours.  Invalid input(s): CK ------------------------------------------------------------------------------------------------------------------ Invalid input(s): POCBNP   ---------------------------------------------------------------------------------------------------------------  Urinalysis    Component Value Date/Time   COLORURINE YELLOW 11/22/2014 0929   APPEARANCEUR CLOUDY* 11/22/2014 0929   LABSPEC 1.017 11/22/2014 0929   PHURINE 7.0 11/22/2014 0929   GLUCOSEU NEGATIVE 11/22/2014 0929   HGBUR NEGATIVE 11/22/2014 0929   BILIRUBINUR NEGATIVE 11/22/2014 0929   KETONESUR 15* 11/22/2014 0929   PROTEINUR NEGATIVE 11/22/2014 0929   UROBILINOGEN 0.2 11/22/2014 0929   NITRITE NEGATIVE 11/22/2014 0929   LEUKOCYTESUR SMALL* 11/22/2014 0929    ----------------------------------------------------------------------------------------------------------------  Imaging results:   Ct Abdomen Pelvis W Contrast  11/22/2014   CLINICAL DATA:  Diffuse abdominal pain.  Colostomy.  EXAM: CT ABDOMEN AND PELVIS WITH CONTRAST  TECHNIQUE: Multidetector CT imaging of the abdomen and pelvis was performed using the standard protocol following bolus administration of intravenous contrast.  CONTRAST:  83mL OMNIPAQUE IOHEXOL 300 MG/ML  SOLN  COMPARISON:  05/01/2014  FINDINGS: Lower chest: There is no pericardial effusion. Calcified atherosclerotic disease and involves the thoracic and abdominal aorta as well as the LAD and left circumflex coronary artery.  Hepatobiliary: There is a cyst within the left lobe of liver measuring 2.2 cm, image 24 of series 201. Several smaller sister noted within the left lobe. The gallbladder is normal. The common bile duct has a maximum diameter of 8 mm. Unchanged from previous exam.  Pancreas: There is increase caliber of the distal pancreatic duct, unchanged from previous exam measuring up to 7 mm. No  mass or obstructing stone identified.  Spleen: Negative  Adrenals/Urinary Tract: The adrenal glands are negative. The right kidney appears normal. Hyperdense lesion arising from the posterior right kidney measures 1.7 cm. This remains concerning for a small renal cell carcinoma. The urinary bladder appears normal.  Stomach/Bowel: The stomach is normal. The small bowel loops are increased in caliber measuring up to 3.5 cm, image 51 of series 201. There is a transition to decreased caliber mid and distal small bowel loops within the left abdomen, image number 39 of series 202 and image number 50 of series 201. There is gas and stool identified within the colon. The patient has a left sided colostomy.  Vascular/Lymphatic: Calcified atherosclerotic disease involves the abdominal aorta. No aneurysm. No enlarged retroperitoneal or mesenteric adenopathy. No enlarged pelvic or inguinal lymph nodes.  Reproductive: The uterus and the adnexal structures are unremarkable.  Other: No free fluid or fluid collections identified within the abdomen or pelvis.  Musculoskeletal: Scoliosis deformity involves the thoracic and lumbar spine. There is multi level degenerative disc disease identified throughout the thoracic and lumbar spine.  IMPRESSION: 1. Examination is positive for small bowel obstruction. The transition point is in the left abdomen likely at the level of the mid jejunum or proximal ileum. This may be secondary to adhesions or stricture. No perforation or abscess. 2. Left lower quadrant colostomy. 3. Liver cysts. 4. Hyperdense lesion involving the posterior left kidney compatible with previously demonstrated solid enhancing mass. This is worrisome for renal cell carcinoma.   Electronically Signed   By: Kerby Moors M.D.   On: 11/22/2014 10:14    My personal review of EKG: Rhythm NSR, Rate  100 /min, QTc 471 , no Acute ST changes    Assessment & Plan  Active Problems:   Gouty arthropathy   Essential  hypertension   Rheumatoid arthritis   Osteoporosis   Atrial flutter   SBO (small bowel obstruction)   Renal mass   small bowel obstruction - CT abdomen pelvis showing small bowel obstruction at the mid jejunum or proximal ileum evel, surgery consulted by ED, continue with bowel rest, IV fluids, when necessary nausea and pain medication, will leave NG tube decision up to surgery. - Repeat abdominal x-ray in a.m.Marland Kitchen  Hypertension - Patient is currently nothing by mouth, so we'll hold oral medication, will start on IV metoprolol 5 mg every 6 hours.  Left kidney mass - Patient reports she is followed by urology as an outpatient Dr Diona Fanti. Instructed to continue to follow as an outpatient with urology.  History of a flutter - Status post ablation,not  on chronic anticoagulation,  resume on aspirin unable to tolerate oral intake . - admit to telemetry   Rheumatoid arthritis  - Will resume patient home medication including methotrexate and Orencia when she is more stable   DVT Prophylaxis Heparin -  AM Labs Ordered, also please review Full Orders  Family Communication: Admission, patients condition and plan of care including tests being ordered have been discussed with the patient , tried to call daughter but no unanswered  who indicate understanding and agree with the plan and Code Status.  Code Status full   Likely DC thome when stable  condition GUARDED    Time spent in minutes : 55 minutes     Bereket Gernert M.D on 11/22/2014 at 11:14 AM  Between 7am to 7pm - Pager - [phone]  After 7pm go to [email or web address] - password [email or web address]  And look for the night coverage person covering me after hours  [facility] Office  [phone]

## 2014-11-22 NOTE — Progress Notes (Signed)
Patient from IR NGT to gomco as ordered . In checking patient she was having projectile vomiting and noted that the NGT was in the back of her throat, patient had been gagging a lot  Called IR to replace tube and patient was given something for nausea

## 2014-11-22 NOTE — ED Notes (Signed)
Pt presents to department from home via [facility] for evaluation of diffuse abdominal pain. History of bowel obstruction, colostomy present upon arrival. Atrial fibrillation on cardiac monitor, history of cardioversion. Pt states 6/10 abdominal pain upon arrival. Pt is alert and oriented x4.

## 2014-11-22 NOTE — Consult Note (Signed)
Reason for Consult:  SBO Referring Physician: Dr. Purcell Mouton (ED)  [name] is an 59 y.o. female.  HPI: Pt presents with diffuse abdominal pain that started this morning around 4 Am, she changed bag last PM about MN.  She was well till early this AM.  No nausea or vomiting so far.  She is well know to our service with prior sigmoid colectomy/colostomy for diverticulitis by Dr. Lilyan Punt in 2014, , laparotomy for lysis of adhesion again in 12/2012,  for SBO.  She was last here for SBO on 11/7 -17/15.  This was treated medically with bowel rest and decompression.  Medicine cared for all her other medical issues. Work up this AM shows T 99.3, tachycardia with BP up.  CMP OK WBC [place] some 10.8, possible UTI. CT scan shows:  small bowel obstruction. The transition point is in the left abdomen likely at the level of the mid jejunum or proximal ileum. This may be secondary to adhesions or stricture. No perforation or abscess. Left lower quadrant colostomy. Hyperdense lesion involving the posterior left kidney compatible with previously demonstrated solid enhancing mass. This is worrisome for renal cell carcinoma. We are ask to see  Past Medical History  Diagnosis Date  . Legally blind     since pt was a teenager  . Hearing aid worn     pt wears bilateral hearing aids  . Marfan's syndrome affecting skin     with scolosis  . Osteoporosis   . Hypertension   . Substance abuse     recovering alcoholic B15 years   . Hernia 4/08  . 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 Chronic gout 2008  . Stroke 2/08  . Fibroid   . Status post bunionectomy 1/10    bilateral   . Diverticulitis of large intestine with perforation   . Small bowel obstruction due to adhesions   . Rheumatoid arthritis     sees Dr. Gavin Pound   . SBO (small bowel obstruction) 01/05/2013  . Atrial flutter     Past Surgical History  Procedure Laterality Date  . Bunionectomy Bilateral 1/10  . Total knee  arthroplasty Bilateral 6/08  . Hernia repair    . Laparotomy N/A 11/27/2012    Procedure: EXPLORATORY LAPAROTOMY SIGMOID COLECTOMY, COLOSTOMY;  Surgeon: Madilyn Hook, DO;  Location: [facility];  Service: General;  Laterality: N/A;  . Colostomy  11/27/2012  . Laparoscopic abdominal exploration N/A 01/06/2013    Procedure: LAPAROSCOPIC ABDOMINAL EXPLORATION;  Surgeon: Adin Hector, MD;  Location: [facility];  Service: General;  Laterality: N/A;  . Laparoscopic lysis of adhesions N/A 01/06/2013    Procedure: LAPAROSCOPIC LYSIS OF ADHESIONS/ INTEROTOMY REPAIR;  Surgeon: Adin Hector, MD;  Location: [facility];  Service: General;  Laterality: N/A;  . Colon surgery      colectomy  . Ablation  01/04/14    atrial flutter ablation (2 circuits) by Dr Lovena Le  . Atrial flutter ablation N/A 01/04/2014    Procedure: ATRIAL FLUTTER ABLATION;  Surgeon: Evans Lance, MD;  Location: [facility] CATH LAB;  Service: Cardiovascular;  Laterality: N/A;    Family History  Problem Relation Age of Onset  . Heart attack Neg Hx     Social History:  reports that she quit smoking about 1 years ago. Her smoking use included Cigarettes. She smoked 1.00 pack per day. She has never used smokeless tobacco. She reports that she does not drink alcohol or use illicit  drugs.  Allergies: No Known Allergies  Medications:  Prior to Admission medications   Medication Sig Start Date End Date Taking? Authorizing Provider  albuterol (PROVENTIL HFA;VENTOLIN HFA) 108 (90 BASE) MCG/ACT inhaler Inhale 1 puff into the lungs every 4 (four) hours as needed for wheezing or shortness of breath. 12/29/13  Yes [name], MD  allopurinol (ZYLOPRIM) 100 MG tablet take 1 tablet by mouth once daily 10/25/14  Yes [name], MD  amLODipine (NORVASC) 10 MG tablet take 1 tablet by mouth once daily 10/25/14  Yes [name], MD  aspirin EC 81 MG tablet Take 1 tablet (81 mg total) by mouth daily. 05/11/14  Yes Verlee Monte, MD  Calcium Carbonate-Vitamin D (CALCIUM +  D PO) Take 1,200 mg by mouth daily.   Yes Historical Provider, MD  cetirizine (ZYRTEC) 10 MG tablet Take 1 tablet (10 mg total) by mouth daily. 11/17/14  Yes [name], MD  ciprofloxacin-dexamethasone [facility]) otic suspension Place 4 drops into both ears 2 (two) times daily. Patient taking differently: Place 4 drops into both ears 2 (two) times daily as needed.  05/18/14  Yes [name], MD  Cyanocobalamin (VITAMIN B 12 PO) Take 1 tablet by mouth daily.   Yes Historical Provider, MD  folic acid (FOLVITE) 1 MG tablet Take 1 mg by mouth daily.   Yes Historical Provider, MD  HYDROcodone-acetaminophen (NORCO) 10-325 MG per tablet Take 1 tablet by mouth every 6 (six) hours as needed for moderate pain. 11/08/14  Yes [name], MD  ketoconazole (NIZORAL) 2 % cream Apply 1 application topically 2 (two) times daily. 08/10/13  Yes [name], MD  MEGACE ES 625 MG/5ML suspension TAKE 5 ML'S BY MOUTH TWICE DAILY Patient taking differently: TAKE 5 ML'S BY MOUTH TWICE DAILY AS NEEDED 08/31/14  Yes [name], MD  methotrexate 25 MG/ML injection Inject 80 mg into the skin once a week. Take on Mondays   Yes Historical Provider, MD  Multiple Vitamins-Minerals (MULTIVITAMIN PO) Take 1 tablet by mouth daily.    Yes Historical Provider, MD  NEXIUM 40 MG capsule take 1 capsule by mouth twice a day 08/31/14  Yes [name], MD  polyethylene glycol [facility] / GLYCOLAX) packet Take 17 g by mouth daily. Patient taking differently: Take 17 g by mouth daily as needed for mild constipation.  05/11/14  Yes Verlee Monte, MD  pyridOXINE (VITAMIN B-6) 50 MG tablet Take 50 mg by mouth daily.   Yes Historical Provider, MD  risedronate (ACTONEL) 150 MG tablet Take 150 mg by mouth every 30 (thirty) days. with water on empty stomach, nothing by mouth or lie down for next 30 minutes.   Yes Historical Provider, MD  tiZANidine (ZANAFLEX) 4 MG tablet Take 1 tablet (4 mg total) by mouth every 6 (six) hours as needed for  muscle spasms. 08/12/14  Yes [name], MD  traMADol Veatrice Bourbon) 50 MG tablet take 1 to 2 tablets by mouth every 6 hours if needed for pain 05/03/14  Yes [name], MD  vitamin C (ASCORBIC ACID) 500 MG tablet Take 500 mg by mouth daily.   Yes Historical Provider, MD  EPINEPHrine (EPIPEN 2-PAK) 0.3 mg/0.3 mL IJ SOAJ injection Inject 0.3 mLs (0.3 mg total) into the muscle once. Patient not taking: Reported on 11/22/2014 02/01/14   [name], MD  promethazine (PHENERGAN) 25 MG tablet Take 1 tablet (25 mg total) by mouth every 6 (six) hours as needed  for nausea or vomiting. 11/16/14   [name], MD     Anti-infectives    None      Results for orders placed or performed during the hospital encounter of 11/22/14 (from the past 48 hour(s))  CBC with Differential     Status: Abnormal   Collection Time: 11/22/14  8:35 AM  Result Value Ref Range   WBC 10.8 (H) 4.0 - 10.5 K/uL   RBC 4.06 3.87 - 5.11 MIL/uL   Hemoglobin 12.5 12.0 - 15.0 g/dL   HCT 37.6 36.0 - 46.0 %   MCV 92.6 78.0 - 100.0 fL   MCH 30.8 26.0 - 34.0 pg   MCHC 33.2 30.0 - 36.0 g/dL   RDW 14.0 11.5 - 15.5 %   Platelets 258 150 - 400 K/uL   Neutrophils Relative % 74 43 - 77 %   Neutro Abs 8.0 (H) 1.7 - 7.7 K/uL   Lymphocytes Relative 20 12 - 46 %   Lymphs Abs 2.1 0.7 - 4.0 K/uL   Monocytes Relative 5 3 - 12 %   Monocytes Absolute 0.6 0.1 - 1.0 K/uL   Eosinophils Relative 1 0 - 5 %   Eosinophils Absolute 0.1 0.0 - 0.7 K/uL   Basophils Relative 0 0 - 1 %   Basophils Absolute 0.0 0.0 - 0.1 K/uL  Comprehensive metabolic panel     Status: Abnormal   Collection Time: 11/22/14  8:35 AM  Result Value Ref Range   Sodium 139 135 - 145 mmol/L   Potassium 3.5 3.5 - 5.1 mmol/L   Chloride 103 101 - 111 mmol/L   CO2 27 22 - 32 mmol/L   Glucose, Bld 104 (H) 65 - 99 mg/dL   BUN 12 6 - 20 mg/dL   Creatinine, Ser 0.89 0.44 - 1.00 mg/dL   Calcium 9.2 8.9 - 10.3 mg/dL   Total Protein 7.2 6.5 - 8.1 g/dL   Albumin 4.0 3.5 - 5.0  g/dL   AST 20 15 - 41 U/L   ALT 14 14 - 54 U/L   Alkaline Phosphatase 91 38 - 126 U/L   Total Bilirubin 0.7 0.3 - 1.2 mg/dL   GFR calc non Af Amer >60 >60 mL/min   GFR calc Af Amer >60 >60 mL/min    Comment: (NOTE) The eGFR has been calculated using the CKD EPI equation. This calculation has not been validated in all clinical situations. eGFR's persistently <60 mL/min signify possible Chronic Kidney Disease.    Anion gap 9 5 - 15  Lipase, blood     Status: None   Collection Time: 11/22/14  8:35 AM  Result Value Ref Range   Lipase 27 22 - 51 U/L  Urinalysis, Routine w reflex microscopic     Status: Abnormal   Collection Time: 11/22/14  9:29 AM  Result Value Ref Range   Color, Urine YELLOW YELLOW   APPearance CLOUDY (A) CLEAR   Specific Gravity, Urine 1.017 1.005 - 1.030   pH 7.0 5.0 - 8.0   Glucose, UA NEGATIVE NEGATIVE mg/dL   Hgb urine dipstick NEGATIVE NEGATIVE   Bilirubin Urine NEGATIVE NEGATIVE   Ketones, ur 15 (A) NEGATIVE mg/dL   Protein, ur NEGATIVE NEGATIVE mg/dL   Urobilinogen, UA 0.2 0.0 - 1.0 mg/dL   Nitrite NEGATIVE NEGATIVE   Leukocytes, UA SMALL (A) NEGATIVE  Urine microscopic-add on     Status: Abnormal   Collection Time: 11/22/14  9:29 AM  Result Value Ref Range   Squamous Epithelial /  LPF MANY (A) RARE   WBC, UA 11-20 <3 WBC/hpf   RBC / HPF 0-2 <3 RBC/hpf   Bacteria, UA FEW (A) RARE   Casts HYALINE CASTS (A) NEGATIVE    Comment: GRANULAR CAST    Ct Abdomen Pelvis W Contrast  11/22/2014   CLINICAL DATA:  Diffuse abdominal pain.  Colostomy.  EXAM: CT ABDOMEN AND PELVIS WITH CONTRAST  TECHNIQUE: Multidetector CT imaging of the abdomen and pelvis was performed using the standard protocol following bolus administration of intravenous contrast.  CONTRAST:  41mL OMNIPAQUE IOHEXOL 300 MG/ML  SOLN  COMPARISON:  05/01/2014  FINDINGS: Lower chest: There is no pericardial effusion. Calcified atherosclerotic disease and involves the thoracic and abdominal aorta as  well as the LAD and left circumflex coronary artery.  Hepatobiliary: There is a cyst within the left lobe of liver measuring 2.2 cm, image 24 of series 201. Several smaller sister noted within the left lobe. The gallbladder is normal. The common bile duct has a maximum diameter of 8 mm. Unchanged from previous exam.  Pancreas: There is increase caliber of the distal pancreatic duct, unchanged from previous exam measuring up to 7 mm. No mass or obstructing stone identified.  Spleen: Negative  Adrenals/Urinary Tract: The adrenal glands are negative. The right kidney appears normal. Hyperdense lesion arising from the posterior right kidney measures 1.7 cm. This remains concerning for a small renal cell carcinoma. The urinary bladder appears normal.  Stomach/Bowel: The stomach is normal. The small bowel loops are increased in caliber measuring up to 3.5 cm, image 51 of series 201. There is a transition to decreased caliber mid and distal small bowel loops within the left abdomen, image number 39 of series 202 and image number 50 of series 201. There is gas and stool identified within the colon. The patient has a left sided colostomy.  Vascular/Lymphatic: Calcified atherosclerotic disease involves the abdominal aorta. No aneurysm. No enlarged retroperitoneal or mesenteric adenopathy. No enlarged pelvic or inguinal lymph nodes.  Reproductive: The uterus and the adnexal structures are unremarkable.  Other: No free fluid or fluid collections identified within the abdomen or pelvis.  Musculoskeletal: Scoliosis deformity involves the thoracic and lumbar spine. There is multi level degenerative disc disease identified throughout the thoracic and lumbar spine.  IMPRESSION: 1. Examination is positive for small bowel obstruction. The transition point is in the left abdomen likely at the level of the mid jejunum or proximal ileum. This may be secondary to adhesions or stricture. No perforation or abscess. 2. Left lower quadrant  colostomy. 3. Liver cysts. 4. Hyperdense lesion involving the posterior left kidney compatible with previously demonstrated solid enhancing mass. This is worrisome for renal cell carcinoma.   Electronically Signed   By: Kerby Moors M.D.   On: 11/22/2014 10:14    Review of Systems  Constitutional: Positive for weight loss (very cachectic woman).  HENT:       Blind both eyes  Eyes:       Blind  Respiratory: Negative.   Cardiovascular: Positive for palpitations (no pain with this).  Gastrointestinal: Positive for abdominal pain.  Genitourinary: Negative.   Musculoskeletal:       She has severe debilitating arthritis in all extremities, but does get around some on a walker at home.  Skin: Negative.   Neurological: Negative.   Psychiatric/Behavioral:       She seems very upbeat for all her medical issues.   Blood pressure 177/85, pulse 104, temperature 99 F (37.2 C), temperature source  Oral, resp. rate 20, last menstrual period 06/25/2006, SpO2 100 %. Physical Exam  Constitutional: She is oriented to person, place, and time. No distress.  Chronically ill, cachectic, no distress, both hands, and feet with arthritic deformities.    HENT:  Head: Normocephalic and atraumatic.  Hearing aide's both ears  Eyes: Right eye exhibits no discharge. Left eye exhibits no discharge.  Neck: Neck supple. No JVD present. No tracheal deviation present. No thyromegaly present.  Scar midline posterior neck  Cardiovascular: Normal heart sounds and intact distal pulses.   Tachycardia, they thought she was in AF, but EKG showing Sinus tachycardia   Respiratory: Breath sounds normal. She is in respiratory distress. She has no wheezes. She has no rales. She exhibits no tenderness.  GI: She exhibits distension (she has a huge hernia midline taking up most of her small abdomen). She exhibits no mass. There is no tenderness. There is no rebound and no guarding.  Soft, large ventral hernia, stool in bag from  around 4 Am when pain started, she changed it at MN last PM, not much prior to pain.  BS absent  Musculoskeletal: She exhibits no edema or tenderness.  Knee replacement on the right  Lymphadenopathy:    She has no cervical adenopathy.  Neurological: She is alert and oriented to person, place, and time. No cranial nerve deficit.  Skin: Skin is warm and dry. No rash noted. She is not diaphoretic. No erythema. No pallor.  Psychiatric: She has a normal mood and affect. Her behavior is normal. Judgment and thought content normal.    Assessment/Plan: Recurrent SBO s/p multiple abdominal surgeries, partial colectomy/colostomy,  last SBO 05/01/14.  Dr. Johney Maine did her last surgery and is following currently. Large ventral hernia New left renal mass, possible neoplasm per CT Blind both eyes/hearing aides Marfan's syndrome with scoliosis Severe debilitating Rheumatoid arthritis with multiple deformities, limited mobility.  Hypertension Gout Atrial flutter  Malnutrition   Plan:  Recommend bowel rest, hydration, NG tube for decompression.  She needed radiology to place last time.  We will follow with you.      Refoel Palladino 11/22/2014, 10:50 AM

## 2014-11-22 NOTE — Progress Notes (Signed)
PT Cancellation Note  Patient Details Name: [name] MRN: [id number] DOB: 05/27/1956   Cancelled Treatment:    Reason Eval/Treat Not Completed: Medical issues which prohibited therapy (Patient having  problems with NG suction recently placed.)   Claretha Cooper 11/22/2014, 2:28 PM Tresa Endo PT [phone]

## 2014-11-23 ENCOUNTER — Inpatient Hospital Stay (HOSPITAL_COMMUNITY): Payer: Medicare Other

## 2014-11-23 LAB — BASIC METABOLIC PANEL WITH GFR
Anion gap: 12 (ref 5–15)
BUN: 9 mg/dL (ref 6–20)
CO2: 26 mmol/L (ref 22–32)
Calcium: 9 mg/dL (ref 8.9–10.3)
Chloride: 104 mmol/L (ref 101–111)
Creatinine, Ser: 0.82 mg/dL (ref 0.44–1.00)
GFR calc Af Amer: >60 mL/min
GFR calc non Af Amer: >60 mL/min
Glucose, Bld: 106 mg/dL — ABNORMAL HIGH (ref 65–99)
Potassium: 4.2 mmol/L (ref 3.5–5.1)
Sodium: 142 mmol/L (ref 135–145)

## 2014-11-23 LAB — CBC
HCT: 38.1 % (ref 36.0–46.0)
Hemoglobin: 12.4 g/dL (ref 12.0–15.0)
MCH: 30.5 pg (ref 26.0–34.0)
MCHC: 32.5 g/dL (ref 30.0–36.0)
MCV: 93.8 fL (ref 78.0–100.0)
Platelets: 246 K/uL (ref 150–400)
RBC: 4.06 MIL/uL (ref 3.87–5.11)
RDW: 14.3 % (ref 11.5–15.5)
WBC: 7.8 K/uL (ref 4.0–10.5)

## 2014-11-23 LAB — GLUCOSE, CAPILLARY
Glucose-Capillary: 101 mg/dL — ABNORMAL HIGH (ref 65–99)
Glucose-Capillary: 108 mg/dL — ABNORMAL HIGH (ref 65–99)
Glucose-Capillary: 108 mg/dL — ABNORMAL HIGH (ref 65–99)
Glucose-Capillary: 90 mg/dL (ref 65–99)

## 2014-11-23 MED ORDER — HYDRALAZINE HCL 20 MG/ML IJ SOLN
5.0000 mg | Freq: Four times a day (QID) | INTRAMUSCULAR | Status: DC | PRN
Start: 2014-11-23 — End: 2014-11-27
  Administered 2014-11-23: 5 mg via INTRAVENOUS
  Filled 2014-11-23: qty 1

## 2014-11-23 MED ORDER — METHOTREXATE SODIUM CHEMO INJECTION 50 MG/2ML
25.0000 mg | INTRAMUSCULAR | Status: DC
  Administered 2014-11-23: 25 mg via SUBCUTANEOUS
  Filled 2014-11-23: qty 1

## 2014-11-23 MED ORDER — METHOTREXATE SODIUM CHEMO INJECTION 50 MG/2ML
80.0000 mg | INTRAMUSCULAR | Status: DC
  Filled 2014-11-23: qty 3.2

## 2014-11-23 MED ORDER — AMLODIPINE BESYLATE 10 MG PO TABS
10.0000 mg | ORAL_TABLET | Freq: Every day | ORAL | Status: DC
  Administered 2014-11-24 – 2014-11-27 (×4): 10 mg via ORAL
  Filled 2014-11-23 (×5): qty 1

## 2014-11-23 NOTE — Progress Notes (Signed)
UR completed 

## 2014-11-23 NOTE — Evaluation (Signed)
Physical Therapy Evaluation Patient Details Name: [name] MRN: [id number] DOB: 05-30-1956 Today's Date: 11/23/2014   History of Present Illness  59 yo legally blind, Marfan's syndrome, HTN, CVA 2008, H/o SBO, RA, Atrial flutter. Admitted with N/V abdominal pain and SBO.   Clinical Impression  Pt very pleasant and given bil UE RA deformity, limited dexterity and grip pt with surprisingly functional mobility. Pt required assist to don/doff shoes for mobility and assist with direction due to lack of vision in unfamiliar environment. Pt with assist of aide and dgtr at baseline. Pt will benefit from acute therapy to maximize mobility and gait to decrease burden of care for safe return home with assist. Recommend OOB to chair daily with assist for rising from low surfaces. Pt declined sitting OOB today. Will continue to follow.     Follow Up Recommendations Home health PT;Supervision for mobility/OOB    Equipment Recommendations  None recommended by PT    Recommendations for Other Services OT consult     Precautions / Restrictions Precautions Precautions: Fall Precaution Comments: NG tube clamped      Mobility  Bed Mobility Overal bed mobility: Modified Independent                Transfers Overall transfer level: Needs assistance   Transfers: Sit to/from Stand;Stand Pivot Transfers Sit to Stand: Supervision Stand pivot transfers: Min assist       General transfer comment: supervision for safety, line mangement and directional cues due to lack of vision. pt pivoted bed <> BSC prior to return to bed. She declined recliner due to lower surface even with offer to pad height  Ambulation/Gait Ambulation/Gait assistance: Min assist Ambulation Distance (Feet): 80 Feet Assistive device: Rolling walker (2 wheeled) Gait Pattern/deviations: Step-through pattern;Decreased stride length;Shuffle   Gait velocity interpretation: Below normal speed for age/gender General Gait  Details: assist to direct and steer Rw due to pt used to RW with added weight of tray and bags as well as unfamiliar environment  Stairs            Wheelchair Mobility    Modified Rankin (Stroke Patients Only)       Balance Overall balance assessment: Needs assistance   Sitting balance-Leahy Scale: Good       Standing balance-Leahy Scale: Poor                               Pertinent Vitals/Pain Pain Assessment: No/denies pain    Home Living Family/patient expects to be discharged to:: Private residence Living Arrangements: Alone Available Help at Discharge: Personal care attendant;Family;Available PRN/intermittently Type of Home: Apartment Home Access: Level entry     Home Layout: One level Home Equipment: Walker - 2 wheels;Bedside commode;Shower seat;Wheelchair - manual;Toilet riser Additional Comments: Pt lived alone with assitance from her daughter for bathing and dressing as well as to manage colostomy. Pt has personal aid that comes in 2 hours a day to A with household management and cooking    Prior Function Level of Independence: Independent with assistive device(s)         Comments: pt able to move about in her apartment with RW unassisted, elevated surfaces to stand from and prefers seats she can place her feet under to assist with standing     Hand Dominance        Extremity/Trunk Assessment   Upper Extremity Assessment: Generalized weakness (bil UE RA deformity but functional for pt needs)  Lower Extremity Assessment: Generalized weakness      Cervical / Trunk Assessment: Kyphotic  Communication   Communication: HOH  Cognition Arousal/Alertness: Awake/alert Behavior During Therapy: WFL for tasks assessed/performed Overall Cognitive Status: Within Functional Limits for tasks assessed                      General Comments      Exercises        Assessment/Plan    PT Assessment Patient needs  continued PT services  PT Diagnosis Difficulty walking;Generalized weakness   PT Problem List Decreased activity tolerance;Decreased mobility  PT Treatment Interventions Gait training;Functional mobility training;Therapeutic activities;Therapeutic exercise;Patient/family education   PT Goals (Current goals can be found in the Care Plan section) Acute Rehab PT Goals Patient Stated Goal: return home PT Goal Formulation: With patient Time For Goal Achievement: 12/07/14 Potential to Achieve Goals: Fair    Frequency Min 3X/week   Barriers to discharge Decreased caregiver support      Co-evaluation               End of Session   Activity Tolerance: Patient tolerated treatment well Patient left: in bed;with call bell/phone within [name] Nurse Communication: Mobility status         Time: 1207-1240 PT Time Calculation (min) (ACUTE ONLY): 33 min   Charges:   PT Evaluation $Initial PT Evaluation Tier I: 1 Procedure PT Treatments $Gait Training: 8-22 mins   PT G CodesMelford [name] 11/23/2014, 1:08 PM [name], [place]

## 2014-11-23 NOTE — Progress Notes (Signed)
Patient ID: [name], female   DOB: 07/08/1955, 59 y.o.   MRN: [id number]    Subjective: Pt feels well today.  Has had to burp her colostomy already once today  Objective: Vital signs in last 24 hours: Temp:  [97.9 F (36.6 C)-98.4 F (36.9 C)] 97.9 F (36.6 C) (05/31 0519) Pulse Rate:  [84-112] 84 (05/31 0519) Resp:  [16-20] 18 (05/31 0519) BP: (122-185)/(64-92) 180/82 mmHg (05/31 0519) SpO2:  [93 %-98 %] 98 % (05/31 0519) Weight:  [60.2 kg (132 lb 11.5 oz)-61.2 kg (134 lb 14.7 oz)] 60.2 kg (132 lb 11.5 oz) (05/31 0519) Last BM Date: 11/22/14  Intake/Output from previous day: 05/30 0701 - 05/31 0700 In: 1000 [I.V.:1000] Out: 1950 [Urine:500; Emesis/NG output:1450] Intake/Output this shift:    PE: Abd: soft, hernia is soft and reducible.  Ostomy with stool and air present, +BS, NGT with minimal output, today.  She has had 1450 since placement yesterday. Heart: regular  Lab Results:   Recent Labs  11/22/14 0835 11/23/14 0446  WBC 10.8* 7.8  HGB 12.5 12.4  HCT 37.6 38.1  PLT 258 246   BMET  Recent Labs  11/22/14 0835 11/23/14 0446  NA 139 142  K 3.5 4.2  CL 103 104  CO2 27 26  GLUCOSE 104* 106*  BUN 12 9  CREATININE 0.89 0.82  CALCIUM 9.2 9.0   PT/INR No results for input(s): LABPROT, INR in the last 72 hours. CMP     Component Value Date/Time   NA 142 11/23/2014 0446   K 4.2 11/23/2014 0446   CL 104 11/23/2014 0446   CO2 26 11/23/2014 0446   GLUCOSE 106* 11/23/2014 0446   BUN 9 11/23/2014 0446   CREATININE 0.82 11/23/2014 0446   CALCIUM 9.0 11/23/2014 0446   PROT 7.2 11/22/2014 0835   ALBUMIN 4.0 11/22/2014 0835   AST 20 11/22/2014 0835   ALT 14 11/22/2014 0835   ALKPHOS 91 11/22/2014 0835   BILITOT 0.7 11/22/2014 0835   GFRNONAA >60 11/23/2014 0446   GFRAA >60 11/23/2014 0446   Lipase     Component Value Date/Time   LIPASE 27 11/22/2014 0835       Studies/Results: Dg Abd 1 View  11/22/2014   CLINICAL DATA:  Small bowel  obstruction, nasogastric tube placement  EXAM: NASO G TUBE PLACEMENT WITH FL AND WITH RAD; ABDOMEN - 1 VIEW  FLUOROSCOPY TIME:  Radiation Exposure Index (as provided by the fluoroscopic device): Dose area product 45.02  COMPARISON:  CT abdomen pelvis dated 11/22/2014  FINDINGS: Enteric tube terminates in the gastric fundus.  IMPRESSION: Enteric tube terminates in the gastric fundus.   Electronically Signed   By: Julian Hy M.D.   On: 11/22/2014 13:58   Ct Abdomen Pelvis W Contrast  11/22/2014   CLINICAL DATA:  Diffuse abdominal pain.  Colostomy.  EXAM: CT ABDOMEN AND PELVIS WITH CONTRAST  TECHNIQUE: Multidetector CT imaging of the abdomen and pelvis was performed using the standard protocol following bolus administration of intravenous contrast.  CONTRAST:  31mL OMNIPAQUE IOHEXOL 300 MG/ML  SOLN  COMPARISON:  05/01/2014  FINDINGS: Lower chest: There is no pericardial effusion. Calcified atherosclerotic disease and involves the thoracic and abdominal aorta as well as the LAD and left circumflex coronary artery.  Hepatobiliary: There is a cyst within the left lobe of liver measuring 2.2 cm, image 24 of series 201. Several smaller sister noted within the left lobe. The gallbladder is normal. The common bile duct has a maximum  diameter of 8 mm. Unchanged from previous exam.  Pancreas: There is increase caliber of the distal pancreatic duct, unchanged from previous exam measuring up to 7 mm. No mass or obstructing stone identified.  Spleen: Negative  Adrenals/Urinary Tract: The adrenal glands are negative. The right kidney appears normal. Hyperdense lesion arising from the posterior right kidney measures 1.7 cm. This remains concerning for a small renal cell carcinoma. The urinary bladder appears normal.  Stomach/Bowel: The stomach is normal. The small bowel loops are increased in caliber measuring up to 3.5 cm, image 51 of series 201. There is a transition to decreased caliber mid and distal small bowel loops  within the left abdomen, image number 39 of series 202 and image number 50 of series 201. There is gas and stool identified within the colon. The patient has a left sided colostomy.  Vascular/Lymphatic: Calcified atherosclerotic disease involves the abdominal aorta. No aneurysm. No enlarged retroperitoneal or mesenteric adenopathy. No enlarged pelvic or inguinal lymph nodes.  Reproductive: The uterus and the adnexal structures are unremarkable.  Other: No free fluid or fluid collections identified within the abdomen or pelvis.  Musculoskeletal: Scoliosis deformity involves the thoracic and lumbar spine. There is multi level degenerative disc disease identified throughout the thoracic and lumbar spine.  IMPRESSION: 1. Examination is positive for small bowel obstruction. The transition point is in the left abdomen likely at the level of the mid jejunum or proximal ileum. This may be secondary to adhesions or stricture. No perforation or abscess. 2. Left lower quadrant colostomy. 3. Liver cysts. 4. Hyperdense lesion involving the posterior left kidney compatible with previously demonstrated solid enhancing mass. This is worrisome for renal cell carcinoma.   Electronically Signed   By: Kerby Moors M.D.   On: 11/22/2014 10:14   Dg Abd Portable 1v  11/23/2014   CLINICAL DATA:  Abdominal distension. Evaluate NG tube. Small-bowel obstruction.  EXAM: PORTABLE ABDOMEN - 1 VIEW  COMPARISON:  11/22/2014; 11/22/2014; CT abdomen pelvis - 11/22/2014  FINDINGS: Enteric tube tip and side port projects over the expected location of the gastric fundus.  On ostomy overlies the left lower abdominal quadrant.  Moderate colonic stool burden without evidence of obstruction.  No supine evidence of pneumoperitoneum. No definite pneumatosis or portal venous gas.  Excreted contrast is seen within the urinary bladder.  Old fracture involving the right inferior pubic ramus. Advanced degenerative change of the bilateral hips.  IMPRESSION:  1. Paucity of bowel gas out evidence of obstruction. 2. Enteric tube tip and side port projected the expected location of the gastric fundus.   Electronically Signed   By: Sandi Mariscal M.D.   On: 11/23/2014 08:03   Dg Loyce Dys Tube Plc W/fl W/rad  11/22/2014   CLINICAL DATA:  NG tube placement for stomach decompression  EXAM: NASO G TUBE PLACEMENT WITH FL AND WITH RAD  FLUOROSCOPY TIME:  Radiation Exposure Index (as provided by the fluoroscopic device): Dose area product 26.35  COMPARISON:  11/22/2014 at 1429 hr  FINDINGS: Enteric tube terminates in the gastric body.  IMPRESSION: Enteric tube terminates in the gastric body.   Electronically Signed   By: Julian Hy M.D.   On: 11/22/2014 17:02   Dg Loyce Dys Tube Plc W/fl W/rad  11/22/2014   CLINICAL DATA:  Small bowel obstruction, nasogastric tube placement  EXAM: NASO G TUBE PLACEMENT WITH FL AND WITH RAD; ABDOMEN - 1 VIEW  FLUOROSCOPY TIME:  Radiation Exposure Index (as provided by the fluoroscopic device): Dose  area product 45.02  COMPARISON:  CT abdomen pelvis dated 11/22/2014  FINDINGS: Enteric tube terminates in the gastric fundus.  IMPRESSION: Enteric tube terminates in the gastric fundus.   Electronically Signed   By: Julian Hy M.D.   On: 11/22/2014 13:58    Anti-infectives: Anti-infectives    None       Assessment/Plan  1. Sbo, h/o prior abdominal surgeries -films today show no evidence of obstruction.  She has lots of flatus and some stool in her colostomy.  I will clamp her NGT today and see how she does.  If she tolerates this, hopefully we can remove this tomorrow.   LOS: 1 day    Delma Villalva E 11/23/2014, 11:09 AM Pager: [phone]

## 2014-11-23 NOTE — Progress Notes (Signed)
[name] ION:[id number] DOB: 28-Jan-1956 DOA: 11/22/2014 PCP: [name], MD  Brief narrative: 59 y/o ? afib/fib not on AC, s/p DCCV 12/2013, Marfan's disease [no concenr for Ao Aneursym so far on echoes], Rh Arthritis. H./o emergent Hartmann colectomy 11/26/2012 for Perf diverticulitis, prior ETOH use, L kidney mass, CVAin the past, Legally blind admitted c SBO 11/22/14.  She has had prior SBO managed conservatviely 04/2014  Past medical history-As per Problem list Chart reviewed as below- Reviewed  Consultants:  General surgery  Procedures:  None  Antibiotics:  None   Subjective  Patient seen alone Tells me she has had multiple episodes of loose stool today however not able to confirm this Passing gas No nausea Tells me she lives alone and gets assistance of a home health aide for 1-2 hours a day Next of kin is her daughter    Objective    Interim History:   Telemetry: Sinus rhythm   Objective: Filed Vitals:   11/22/14 1501 11/22/14 2135 11/23/14 0200 11/23/14 0519  BP: 185/92 122/64 158/84 180/82  Pulse: 87 107 94 84  Temp: 98.2 F (36.8 C) 98.3 F (36.8 C) 98.4 F (36.9 C) 97.9 F (36.6 C)  TempSrc: Oral Oral Oral Oral  Resp: 16 18 20 18   Height:      Weight:    60.2 kg (132 lb 11.5 oz)  SpO2: 93% 95% 97% 98%    Intake/Output Summary (Last 24 hours) at 11/23/14 1320 Last data filed at 11/23/14 0200  Gross per 24 hour  Intake      0 ml  Output   1800 ml  Net  -1800 ml    Exam:  General: EOMI, very frail, bitemporal wasting, supraclavicular wasting, Cardiovascular: S1-S2 in sinus slightly tachycardic no murmur no JVD chest clear Respiratory: Chest clear no added sound Abdomen: Noted hernia left lower quadrant with intact colostomy. Skin no lower extremity edema Neuro grossly intact however has contractures in the upper arms consistent with her diagnosis of Marfan's  Data Reviewed: Basic Metabolic Panel:  Recent Labs Lab  11/22/14 0835 11/23/14 0446  NA 139 142  K 3.5 4.2  CL 103 104  CO2 27 26  GLUCOSE 104* 106*  BUN 12 9  CREATININE 0.89 0.82  CALCIUM 9.2 9.0   Liver Function Tests:  Recent Labs Lab 11/22/14 0835  AST 20  ALT 14  ALKPHOS 91  BILITOT 0.7  PROT 7.2  ALBUMIN 4.0    Recent Labs Lab 11/22/14 0835  LIPASE 27   No results for input(s): AMMONIA in the last 168 hours. CBC:  Recent Labs Lab 11/22/14 0835 11/23/14 0446  WBC 10.8* 7.8  NEUTROABS 8.0*  --   HGB 12.5 12.4  HCT 37.6 38.1  MCV 92.6 93.8  PLT 258 246   Cardiac Enzymes: No results for input(s): CKTOTAL, CKMB, CKMBINDEX, TROPONINI in the last 168 hours. BNP: Invalid input(s): POCBNP CBG:  Recent Labs Lab 11/22/14 1951 11/23/14 0003 11/23/14 0600 11/23/14 0743  GLUCAP 92 108* 101* 108*    No results found for this or any previous visit (from the past 240 hour(s)).   Studies:              All Imaging reviewed and is as per above notation   Scheduled Meds: . allopurinol  100 mg Oral Daily  . heparin  5,000 Units Subcutaneous 3 times per day  . metoprolol  5 mg Intravenous 4 times per day   Continuous Infusions: . sodium  chloride 75 mL/hr at 11/23/14 0243     Assessment/Plan:  1. Small bowel obstruction-per general surgery. Repeat XR a.m. 2. Afib s/p failed DCCV,CHAD2Vasc~5-not on Coumadin. Continue rate controlling agent metoprolol IV 4 times a day for now. She is rate controlled currently however 3. Rh Arthritis-Continue MTX 80 q week-tak e folic acid 1 mg daily + other meds' 4. history of Marfan's syndrome-patient needs echocardiogram as per cardiology as an outpatient. Last echo showed EF of 20% diffuse hypokinesis PA peak 33 mmHg and moderate mitral valve regurgitation. There was no aortic dilatation 5. Compensated systolic heart failure EF as above-do not diuresis presently no need for diuresis at present 6. HTn-uncontrolled as is not currently on her home meds of amlodipine 10,  continue IV metoprolol as above  Code Status: Full Family Communication: Discussed with daughter Disposition Plan: inpatient pending resolution   Verneita Griffes, MD  [facility] Pager [phone] 11/23/2014, 1:20 PM    LOS: 1 day

## 2014-11-23 NOTE — Progress Notes (Signed)
NGT clamped per order. 

## 2014-11-24 ENCOUNTER — Inpatient Hospital Stay (HOSPITAL_COMMUNITY): Payer: Medicare Other

## 2014-11-24 DIAGNOSIS — I4892 Unspecified atrial flutter: Secondary | ICD-10-CM

## 2014-11-24 DIAGNOSIS — M069 Rheumatoid arthritis, unspecified: Secondary | ICD-10-CM

## 2014-11-24 MED ORDER — SALINE SPRAY 0.65 % NA SOLN
1.0000 | NASAL | Status: DC | PRN
  Administered 2014-11-25: 1 via NASAL
  Filled 2014-11-24 (×2): qty 44

## 2014-11-24 NOTE — Progress Notes (Signed)
Subjective: She feels OK she has some discomfort, but not much.  Stool is very thick and hard.  No nausea.  He NG has been clamped since yesterday.    Objective: Vital signs in last 24 hours: Temp:  [98.5 F (36.9 C)-98.6 F (37 C)] 98.5 F (36.9 C) (06/01 0507) Pulse Rate:  [75-87] 82 (06/01 0507) Resp:  [18] 18 (06/01 0507) BP: (112-152)/(57-90) 152/90 mmHg (06/01 0507) SpO2:  [98 %-100 %] 100 % (06/01 0507) Weight:  [59.2 kg (130 lb 8.2 oz)] 59.2 kg (130 lb 8.2 oz) (06/01 0507) Last BM Date: 11/23/14 200 from NG + stool NPO AFEBRILE, VSS Film shows:  Persistent SBO, it may be a little better than scout film.  IR seems to be the only folks who can get NG in her.  NO labs  Intake/Output from previous day: 05/31 0701 - 06/01 0700 In: 0  Out: 951 [Urine:750; Emesis/NG output:200; Stool:1] Intake/Output this shift:    General appearance: alert, cooperative and no distress GI: minimal discomfort with palpation of abdomen, i think she is chronically sore at her hernia site.  she does have BS.  Not much gas in the ostomy, but very thick stool .  she also has some pain with thick stools.  Lab Results:   Recent Labs  11/22/14 0835 11/23/14 0446  WBC 10.8* 7.8  HGB 12.5 12.4  HCT 37.6 38.1  PLT 258 246    BMET  Recent Labs  11/22/14 0835 11/23/14 0446  NA 139 142  K 3.5 4.2  CL 103 104  CO2 27 26  GLUCOSE 104* 106*  BUN 12 9  CREATININE 0.89 0.82  CALCIUM 9.2 9.0   PT/INR No results for input(s): LABPROT, INR in the last 72 hours.   Recent Labs Lab 11/22/14 0835  AST 20  ALT 14  ALKPHOS 91  BILITOT 0.7  PROT 7.2  ALBUMIN 4.0     Lipase     Component Value Date/Time   LIPASE 27 11/22/2014 0835     Studies/Results: Dg Abd 1 View  11/22/2014   CLINICAL DATA:  Small bowel obstruction, nasogastric tube placement  EXAM: NASO G TUBE PLACEMENT WITH FL AND WITH RAD; ABDOMEN - 1 VIEW  FLUOROSCOPY TIME:  Radiation Exposure Index (as provided by the  fluoroscopic device): Dose area product 45.02  COMPARISON:  CT abdomen pelvis dated 11/22/2014  FINDINGS: Enteric tube terminates in the gastric fundus.  IMPRESSION: Enteric tube terminates in the gastric fundus.   Electronically Signed   By: Julian Hy M.D.   On: 11/22/2014 13:58   Dg Abd Acute W/chest  11/24/2014   CLINICAL DATA:  Small bowel obstruction, feeling better, history hypertension  EXAM: DG ABDOMEN ACUTE W/ 1V CHEST  COMPARISON:  Chest radiograph 05/04/2014, abdominal radiograph 11/23/2014, CT abdomen and pelvis 11/22/2014  FINDINGS: Nasogastric tube coiled in proximal stomach.  Borderline enlargement of cardiac silhouette.  Suspected hiatal hernia.  Mediastinal contours and pulmonary vascularity normal.  Lungs clear.  No pleural effusion or pneumothorax.  Ostomy LEFT lower quadrant.  Small amount of retained contrast.  Few dilated small bowel loops in pelvis out of proportion to colonic diameter consistent with small bowel obstruction.  No bowel wall thickening or free intraperitoneal air.  Severe osseous demineralization with advanced degenerative disc/facet disease changes of the thoracolumbar spine with significant levoconvex scoliosis.  Degenerative changes BILATERAL hip joints.  BILATERAL glenohumeral degenerative changes, advanced on RIGHT with destruction of the femoral head and glenoid, severe on LEFT  with large calcified loose body inferior to the glenoid.  IMPRESSION: Persistent small bowel obstruction.  Suspected hiatal hernia.   Electronically Signed   By: Lavonia Dana M.D.   On: 11/24/2014 08:20   Dg Abd Portable 1v  11/23/2014   CLINICAL DATA:  Abdominal distension. Evaluate NG tube. Small-bowel obstruction.  EXAM: PORTABLE ABDOMEN - 1 VIEW  COMPARISON:  11/22/2014; 11/22/2014; CT abdomen pelvis - 11/22/2014  FINDINGS: Enteric tube tip and side port projects over the expected location of the gastric fundus.  On ostomy overlies the left lower abdominal quadrant.  Moderate  colonic stool burden without evidence of obstruction.  No supine evidence of pneumoperitoneum. No definite pneumatosis or portal venous gas.  Excreted contrast is seen within the urinary bladder.  Old fracture involving the right inferior pubic ramus. Advanced degenerative change of the bilateral hips.  IMPRESSION: 1. Paucity of bowel gas out evidence of obstruction. 2. Enteric tube tip and side port projected the expected location of the gastric fundus.   Electronically Signed   By: Sandi Mariscal M.D.   On: 11/23/2014 08:03   Dg Loyce Dys Tube Plc W/fl W/rad  11/22/2014   CLINICAL DATA:  NG tube placement for stomach decompression  EXAM: NASO G TUBE PLACEMENT WITH FL AND WITH RAD  FLUOROSCOPY TIME:  Radiation Exposure Index (as provided by the fluoroscopic device): Dose area product 26.35  COMPARISON:  11/22/2014 at 1429 hr  FINDINGS: Enteric tube terminates in the gastric body.  IMPRESSION: Enteric tube terminates in the gastric body.   Electronically Signed   By: Julian Hy M.D.   On: 11/22/2014 17:02   Dg Loyce Dys Tube Plc W/fl W/rad  11/22/2014   CLINICAL DATA:  Small bowel obstruction, nasogastric tube placement  EXAM: NASO G TUBE PLACEMENT WITH FL AND WITH RAD; ABDOMEN - 1 VIEW  FLUOROSCOPY TIME:  Radiation Exposure Index (as provided by the fluoroscopic device): Dose area product 45.02  COMPARISON:  CT abdomen pelvis dated 11/22/2014  FINDINGS: Enteric tube terminates in the gastric fundus.  IMPRESSION: Enteric tube terminates in the gastric fundus.   Electronically Signed   By: Julian Hy M.D.   On: 11/22/2014 13:58    Medications: . allopurinol  100 mg Oral Daily  . amLODipine  10 mg Oral Daily  . heparin  5,000 Units Subcutaneous 3 times per day  . methotrexate  25 mg Subcutaneous Weekly  . metoprolol  5 mg Intravenous 4 times per day    Assessment/Plan Recurrent SBO s/p multiple abdominal surgeries, partial colectomy/colostomy, last SBO 05/01/14. Dr. Johney Maine did her last surgery  and is following currently. Large ventral hernia New left renal mass, possible neoplasm per CT Blind both eyes/hearing aides Marfan's syndrome with scoliosis Severe debilitating Rheumatoid arthritis with multiple deformities, limited mobility.  Hypertension Gout Atrial flutter  Malnutrition DVT:  SCD/heparin Antibiotics:  none   Plan:  I'm hesitant to pull tube with film showing ongoing SBO.  I hooked her back up again since film results.  I am going to leave her to suction for now until Dr. Brantley Stage see's her.  I will give her some ice chips and see how she does.  She is very sedentary secondary to her severe arthritis so ambulation isn't really much of an option although she lives alone most of the time at home.  Recheck film in AM.      LOS: 2 days    [name] 11/24/2014

## 2014-11-24 NOTE — Clinical Documentation Improvement (Signed)
"  Malnutrition" and "chronic malnutrition" currently documented in chart.  Per nursing, height 5 feet 8 inches, weight 134 pounds 14.7 ounces on day of admission with BMI 20.6: today's weight 130 pounds 8.2 ounces, Please provide the degree of malnutrition (severe, moderate, mild)  if known and document in your progress notes and carry over to the discharge summary.  Thank you, Mateo Flow, RN [phone] Clinical Documentation Specialist

## 2014-11-24 NOTE — Progress Notes (Signed)
Pt has colostomy pouch to LLQ. Patient's daughter manages ostomy at home for patient, per daughter pt bag is changed every other day or PRN. Pouch was changed in AM prior to admit on 5/30. Pouch is not leaking at this time and without complications. Daughter states she will bring supplies to change bag tomorrow. Daughter told to notify RN if unable to bring supplies.

## 2014-11-24 NOTE — Progress Notes (Signed)
[facility] PROGRESS NOTE  [name] ZOX:[id number] DOB: [birth year] DOA: 11/22/2014 PCP: [name], MD  Assessment/Plan: #1 small bowel obstruction Patient still with a small bowel obstruction or films. Patient with some gas and some stool in colostomy bag. NG tube still in place and to suction. Per general surgery. Repeat films in the morning.  #2 A. fib S/P failed DCCV. Chads2vasc 5. Continue IV metoprolol for rate control. Patient not on anticoagulation.  #3 rheumatoid arthritis Continue methotrexate, folic acid.  #4 history of Marfan syndrome Patient was supposed to get a to get a 2-D echo per cardiology as outpatient.  #5 compensated systolic heart failure Stable. Hold off on diuresis at this time.  #6 hypertension Continue current regimen of Norvasc and IV metoprolol.  #7 renal mass Per CT of the abdomen and pelvis with no significant change from right imaging. We'll need to discuss with daughter. Will likely need outpatient urology referral.  #8 prophylaxis Heparin for DVT prophylaxis.   Code Status: Full Family Communication: No family at bedside. Disposition Plan: Pending resolution of small bowel obstruction.   Consultants:  General surgery: Dr. Dalbert Batman 11/22/2014  Procedures:  Acute abdominal series 11/24/2014  Abdominal x-ray 11/23/2014  CT abdomen and pelvis 11/22/2014  Antibiotics:  None    HPI/Subjective: Patient sleeping/resting comfortably.  Objective: Filed Vitals:   11/24/14 1351  BP: 139/84  Pulse: 72  Temp: 98.5 F (36.9 C)  Resp:     Intake/Output Summary (Last 24 hours) at 11/24/14 1553 Last data filed at 11/24/14 1445  Gross per 24 hour  Intake      0 ml  Output   1351 ml  Net  -1351 ml   Filed Weights   11/22/14 1156 11/23/14 0519 11/24/14 0507  Weight: 61.2 kg (134 lb 14.7 oz) 60.2 kg (132 lb 11.5 oz) 59.2 kg (130 lb 8.2 oz)    Exam:   General:  Patient sleeping. Chronically ill-looking.  Cachectic. Hearing aids in both ears. NG tube in place.  Cardiovascular: Regular rate and rhythm. No murmurs rubs or gallops.  Respiratory: Lungs clear to auscultation anterior lung fields.  Abdomen: Soft, nondistended, positive bowel sounds. Colostomy bag with gas and some stool.  Musculoskeletal: No clubbing cyanosis or edema.  Data Reviewed: Basic Metabolic Panel:  Recent Labs Lab 11/22/14 0835 11/23/14 0446  NA 139 142  K 3.5 4.2  CL 103 104  CO2 27 26  GLUCOSE 104* 106*  BUN 12 9  CREATININE 0.89 0.82  CALCIUM 9.2 9.0   Liver Function Tests:  Recent Labs Lab 11/22/14 0835  AST 20  ALT 14  ALKPHOS 91  BILITOT 0.7  PROT 7.2  ALBUMIN 4.0    Recent Labs Lab 11/22/14 0835  LIPASE 27   No results for input(s): AMMONIA in the last 168 hours. CBC:  Recent Labs Lab 11/22/14 0835 11/23/14 0446  WBC 10.8* 7.8  NEUTROABS 8.0*  --   HGB 12.5 12.4  HCT 37.6 38.1  MCV 92.6 93.8  PLT 258 246   Cardiac Enzymes: No results for input(s): CKTOTAL, CKMB, CKMBINDEX, TROPONINI in the last 168 hours. BNP (last 3 results) No results for input(s): BNP in the last 8760 hours.  ProBNP (last 3 results)  Recent Labs  12/31/13 1116 01/04/14 0245  PROBNP 6068.0* 3355.0*    CBG:  Recent Labs Lab 11/22/14 1951 11/23/14 0003 11/23/14 0600 11/23/14 0743 11/23/14 1627  GLUCAP 92 108* 101* 108* 90    No results found for this or  any previous visit (from the past 240 hour(s)).   Studies: Dg Abd Acute W/chest  11/24/2014   CLINICAL DATA:  Small bowel obstruction, feeling better, history hypertension  EXAM: DG ABDOMEN ACUTE W/ 1V CHEST  COMPARISON:  Chest radiograph 05/04/2014, abdominal radiograph 11/23/2014, CT abdomen and pelvis 11/22/2014  FINDINGS: Nasogastric tube coiled in proximal stomach.  Borderline enlargement of cardiac silhouette.  Suspected hiatal hernia.  Mediastinal contours and pulmonary vascularity normal.  Lungs clear.  No pleural effusion or  pneumothorax.  Ostomy LEFT lower quadrant.  Small amount of retained contrast.  Few dilated small bowel loops in pelvis out of proportion to colonic diameter consistent with small bowel obstruction.  No bowel wall thickening or free intraperitoneal air.  Severe osseous demineralization with advanced degenerative disc/facet disease changes of the thoracolumbar spine with significant levoconvex scoliosis.  Degenerative changes BILATERAL hip joints.  BILATERAL glenohumeral degenerative changes, advanced on RIGHT with destruction of the femoral head and glenoid, severe on LEFT with large calcified loose body inferior to the glenoid.  IMPRESSION: Persistent small bowel obstruction.  Suspected hiatal hernia.   Electronically Signed   By: Lavonia Dana M.D.   On: 11/24/2014 08:20   Dg Abd Portable 1v  11/23/2014   CLINICAL DATA:  Abdominal distension. Evaluate NG tube. Small-bowel obstruction.  EXAM: PORTABLE ABDOMEN - 1 VIEW  COMPARISON:  11/22/2014; 11/22/2014; CT abdomen pelvis - 11/22/2014  FINDINGS: Enteric tube tip and side port projects over the expected location of the gastric fundus.  On ostomy overlies the left lower abdominal quadrant.  Moderate colonic stool burden without evidence of obstruction.  No supine evidence of pneumoperitoneum. No definite pneumatosis or portal venous gas.  Excreted contrast is seen within the urinary bladder.  Old fracture involving the right inferior pubic ramus. Advanced degenerative change of the bilateral hips.  IMPRESSION: 1. Paucity of bowel gas out evidence of obstruction. 2. Enteric tube tip and side port projected the expected location of the gastric fundus.   Electronically Signed   By: Sandi Mariscal M.D.   On: 11/23/2014 08:03   Dg Loyce Dys Tube Plc W/fl W/rad  11/22/2014   CLINICAL DATA:  NG tube placement for stomach decompression  EXAM: NASO G TUBE PLACEMENT WITH FL AND WITH RAD  FLUOROSCOPY TIME:  Radiation Exposure Index (as provided by the fluoroscopic device): Dose  area product 26.35  COMPARISON:  11/22/2014 at 1429 hr  FINDINGS: Enteric tube terminates in the gastric body.  IMPRESSION: Enteric tube terminates in the gastric body.   Electronically Signed   By: Julian Hy M.D.   On: 11/22/2014 17:02    Scheduled Meds: . allopurinol  100 mg Oral Daily  . amLODipine  10 mg Oral Daily  . heparin  5,000 Units Subcutaneous 3 times per day  . methotrexate  25 mg Subcutaneous Weekly  . metoprolol  5 mg Intravenous 4 times per day   Continuous Infusions: . sodium chloride 75 mL/hr at 11/24/14 1457    Principal Problem:   SBO (small bowel obstruction) Active Problems:   Gouty arthropathy   Essential hypertension   Rheumatoid arthritis   Osteoporosis   Atrial flutter   Renal mass    Time spent: 35 minutes    [facility] MD [facility] Pager [phone]. If 7PM-7AM, please contact night-coverage at [email or web address], password [facility] 11/24/2014, 3:53 PM  LOS: 2 days

## 2014-11-24 NOTE — Progress Notes (Signed)
Physical Therapy Treatment Patient Details Name: [name] MRN: [id number] DOB: 1955/07/04 Today's Date: 11/24/2014    History of Present Illness 59 yo legally blind, Marfan's syndrome, HTN, CVA 2008, H/o SBO, RA, Atrial flutter. Admitted with N/V abdominal pain and SBO.     PT Comments    Pt moving well with increased activity tolerance and gait today. Willing to be OOB in chair for limited time but states positioning with cause her ostomy to leak, denied need for towel or repositioning in chair. Pt encouraged to increase mobility and be OOB throughout the day. Will continue to follow with nursing notified of mobility, precautions and position.   Follow Up Recommendations  Home health PT;Supervision for mobility/OOB     Equipment Recommendations       Recommendations for Other Services       Precautions / Restrictions Precautions Precautions: Fall Precaution Comments: NG tube clamped, blind    Mobility  Bed Mobility Overal bed mobility: Modified Independent             General bed mobility comments: increased posterior lean with transfers to EOB but sufficient for pt technique of mobilizing  Transfers Overall transfer level: Needs assistance     Sit to Stand: Supervision         General transfer comment: supervision for safety, line mangement and directional cues due to lack of vision  Ambulation/Gait Ambulation/Gait assistance: Min assist Ambulation Distance (Feet): 200 Feet Assistive device: Rolling walker (2 wheeled) Gait Pattern/deviations: Step-through pattern;Decreased stride length;Drifts right/left   Gait velocity interpretation: Below normal speed for age/gender General Gait Details: assist to direct and steer Rw due to pt used to RW with added weight of tray and bags as well as unfamiliar environment   Stairs            Wheelchair Mobility    Modified Rankin (Stroke Patients Only)       Balance Overall balance assessment: Needs  assistance   Sitting balance-Leahy Scale: Good       Standing balance-Leahy Scale: Poor                      Cognition Arousal/Alertness: Awake/alert Behavior During Therapy: WFL for tasks assessed/performed Overall Cognitive Status: Within Functional Limits for tasks assessed                      Exercises      General Comments        Pertinent Vitals/Pain Pain Assessment: 0-10 Pain Score: 5  Pain Location: abdomen Pain Descriptors / Indicators: Aching Pain Intervention(s): Limited activity within patient's tolerance;Repositioned    Home Living                      Prior Function            PT Goals (current goals can now be found in the care plan section) Progress towards PT goals: Progressing toward goals    Frequency       PT Plan Current plan remains appropriate    Co-evaluation             End of Session   Activity Tolerance: Patient tolerated treatment well Patient left: in chair;with call bell/phone within reach;with chair alarm set (with chair padded for additional height)     Time: 7619-5093 PT Time Calculation (min) (ACUTE ONLY): 25 min  Charges:  $Gait Training: 8-22 mins $Therapeutic Activity: 8-22 mins  G Codes:      Melford Aase 2014-11-27, 10:30 AM Elwyn Reach, [place]

## 2014-11-25 ENCOUNTER — Inpatient Hospital Stay (HOSPITAL_COMMUNITY): Payer: Medicare Other

## 2014-11-25 LAB — CBC
HCT: 36.2 % (ref 36.0–46.0)
Hemoglobin: 11.7 g/dL — ABNORMAL LOW (ref 12.0–15.0)
MCH: 30.2 pg (ref 26.0–34.0)
MCHC: 32.3 g/dL (ref 30.0–36.0)
MCV: 93.5 fL (ref 78.0–100.0)
Platelets: 220 10*3/uL (ref 150–400)
RBC: 3.87 MIL/uL (ref 3.87–5.11)
RDW: 13.7 % (ref 11.5–15.5)
WBC: 7.5 10*3/uL (ref 4.0–10.5)

## 2014-11-25 LAB — BASIC METABOLIC PANEL
Anion gap: 12 (ref 5–15)
BUN: 10 mg/dL (ref 6–20)
CO2: 20 mmol/L — ABNORMAL LOW (ref 22–32)
Calcium: 8.7 mg/dL — ABNORMAL LOW (ref 8.9–10.3)
Chloride: 106 mmol/L (ref 101–111)
Creatinine, Ser: 0.75 mg/dL (ref 0.44–1.00)
GFR calc Af Amer: >60 mL/min (ref >60)
GFR calc non Af Amer: >60 mL/min (ref >60)
Glucose, Bld: 62 mg/dL — ABNORMAL LOW (ref 65–99)
Potassium: 3.9 mmol/L (ref 3.5–5.1)
Sodium: 138 mmol/L (ref 135–145)

## 2014-11-25 LAB — GLUCOSE, CAPILLARY
Glucose-Capillary: 79 mg/dL (ref 65–99)
Glucose-Capillary: 97 mg/dL (ref 65–99)

## 2014-11-25 LAB — MAGNESIUM: Magnesium: 1.7 mg/dL (ref 1.7–2.4)

## 2014-11-25 MED ORDER — MAGNESIUM SULFATE 50 % IJ SOLN
3.0000 g | Freq: Once | INTRAVENOUS | Status: AC
  Administered 2014-11-25: 3 g via INTRAVENOUS
  Filled 2014-11-25: qty 6

## 2014-11-25 NOTE — Progress Notes (Signed)
Colostomy bag was changed at 2300 on 11/24/14 by patient's daughter.  Usually changed every other day. Will continue to monitor.

## 2014-11-25 NOTE — Progress Notes (Signed)
Patient ID: [name], female   DOB: Jan 31, 1956, 59 y.o.   MRN: [id number]     [facility]      [address] [place], [place] [address]    Phone: [phone] FAX: [phone]     Subjective: No n/v.  NGT to suction, colostomy functioning.    Objective:  Vital signs:  Filed Vitals:   11/24/14 1351 11/24/14 1817 11/24/14 2100 11/25/14 0614  BP: 139/84 160/68 141/74 133/92  Pulse: 72 75 81 87  Temp: 98.5 F (36.9 C)  98.2 F (36.8 C) 98.5 F (36.9 C)  TempSrc: Oral  Oral Oral  Resp:   18 18  Height:      Weight:    59.6 kg (131 lb 6.3 oz)  SpO2: 100%  100% 99%    Last BM Date: 11/24/14  Intake/Output   Yesterday:  06/01 0701 - 06/02 0700 In: 4993.8 [P.O.:60; I.V.:4933.8] Out: 5916 [Urine:1150; Stool:1] This shift:  Total I/O In: -  Out: 350 [Emesis/NG output:350]   Physical Exam: General: Pt awake/alert/oriented x4 in no acute distress Abdomen: Soft.  Nondistended.  Ventral hernia is soft and reducible.  Colostomy is functional.  No evidence of peritonitis.    Problem List:   Principal Problem:   SBO (small bowel obstruction) Active Problems:   Gouty arthropathy   Essential hypertension   Rheumatoid arthritis   Osteoporosis   Atrial flutter   Renal mass    Results:   Labs: Results for orders placed or performed during the hospital encounter of 11/22/14 (from the past 48 hour(s))  Glucose, capillary     Status: None   Collection Time: 11/23/14  4:27 PM  Result Value Ref Range   Glucose-Capillary 90 65 - 99 mg/dL  Basic metabolic panel     Status: Abnormal   Collection Time: 11/25/14  6:33 AM  Result Value Ref Range   Sodium 138 135 - 145 mmol/L   Potassium 3.9 3.5 - 5.1 mmol/L   Chloride 106 101 - 111 mmol/L   CO2 20 (L) 22 - 32 mmol/L   Glucose, Bld 62 (L) 65 - 99 mg/dL   BUN 10 6 - 20 mg/dL   Creatinine, Ser 0.75 0.44 - 1.00 mg/dL   Calcium 8.7 (L) 8.9 - 10.3 mg/dL   GFR calc non Af  Amer >60 >60 mL/min   GFR calc Af Amer >60 >60 mL/min    Comment: (NOTE) The eGFR has been calculated using the CKD EPI equation. This calculation has not been validated in all clinical situations. eGFR's persistently <60 mL/min signify possible Chronic Kidney Disease.    Anion gap 12 5 - 15  CBC     Status: Abnormal   Collection Time: 11/25/14  6:33 AM  Result Value Ref Range   WBC 7.5 4.0 - 10.5 K/uL   RBC 3.87 3.87 - 5.11 MIL/uL   Hemoglobin 11.7 (L) 12.0 - 15.0 g/dL   HCT 36.2 36.0 - 46.0 %   MCV 93.5 78.0 - 100.0 fL   MCH 30.2 26.0 - 34.0 pg   MCHC 32.3 30.0 - 36.0 g/dL   RDW 13.7 11.5 - 15.5 %   Platelets 220 150 - 400 K/uL  Magnesium     Status: None   Collection Time: 11/25/14  6:33 AM  Result Value Ref Range   Magnesium 1.7 1.7 - 2.4 mg/dL    Imaging / Studies: Dg Abd 1 View  11/25/2014   CLINICAL DATA:  Check nasogastric catheter placement  EXAM: ABDOMEN - 1 VIEW  COMPARISON:  11/24/2014  FINDINGS: Scattered large and small bowel gas is noted. The degree of small bowel dilatation has resolved in the interval. Contrast material from prior CT examination is again seen. A nasogastric catheter is again noted coiled within the stomach. Significant scoliosis concave to the right is noted with associated degenerative changes of the lumbar spine and hip joints bilaterally.  IMPRESSION: Resolution of previously seen small bowel dilatation. The nasogastric catheter is again noted within the stomach.   Electronically Signed   By: Inez Catalina M.D.   On: 11/25/2014 08:07   Dg Abd Acute W/chest  11/24/2014   CLINICAL DATA:  Small bowel obstruction, feeling better, history hypertension  EXAM: DG ABDOMEN ACUTE W/ 1V CHEST  COMPARISON:  Chest radiograph 05/04/2014, abdominal radiograph 11/23/2014, CT abdomen and pelvis 11/22/2014  FINDINGS: Nasogastric tube coiled in proximal stomach.  Borderline enlargement of cardiac silhouette.  Suspected hiatal hernia.  Mediastinal contours and pulmonary  vascularity normal.  Lungs clear.  No pleural effusion or pneumothorax.  Ostomy LEFT lower quadrant.  Small amount of retained contrast.  Few dilated small bowel loops in pelvis out of proportion to colonic diameter consistent with small bowel obstruction.  No bowel wall thickening or free intraperitoneal air.  Severe osseous demineralization with advanced degenerative disc/facet disease changes of the thoracolumbar spine with significant levoconvex scoliosis.  Degenerative changes BILATERAL hip joints.  BILATERAL glenohumeral degenerative changes, advanced on RIGHT with destruction of the femoral head and glenoid, severe on LEFT with large calcified loose body inferior to the glenoid.  IMPRESSION: Persistent small bowel obstruction.  Suspected hiatal hernia.   Electronically Signed   By: Lavonia Dana M.D.   On: 11/24/2014 08:20    Medications / Allergies:  Scheduled Meds: . allopurinol  100 mg Oral Daily  . amLODipine  10 mg Oral Daily  . heparin  5,000 Units Subcutaneous 3 times per day  . magnesium sulfate 1 - 4 g bolus IVPB  3 g Intravenous Once  . methotrexate  25 mg Subcutaneous Weekly  . metoprolol  5 mg Intravenous 4 times per day   Continuous Infusions: . sodium chloride 75 mL/hr at 11/25/14 0230   PRN Meds:.acetaminophen **OR** acetaminophen, albuterol, bisacodyl, hydrALAZINE, morphine injection, ondansetron **OR** ondansetron (ZOFRAN) IV, polyethylene glycol, sodium chloride  Antibiotics: Anti-infectives    None        Assessment/Plan Recurrent SBO s/p multiple abdominal surgeries, partial colectomy/colostomy, last SBO 05/01/14. Dr. Johney Maine did her last surgery and is following currently. Large ventral hernia Resolved Colostomy is functioning and abdomen is benign.   Will DC NGT and allow for clears VTE prophylaxis-SCD/heparin FEN-clears   Erby Pian, [facility] Pager [phone](7A-4:30P) For consults and floor pages call  [phone](7A-4:30P)  11/25/2014 9:47 AM

## 2014-11-25 NOTE — Progress Notes (Signed)
[facility] PROGRESS NOTE  [name] IRS:[id number] DOB: 11-10-1955 DOA: 11/22/2014 PCP: [name], MD  Assessment/Plan: #1 small bowel obstruction Patient with improvement with small bowel obstruction. Abdominal films which seems to have resolved. Patient with stool and gas in colostomy bag. NG tube has been discontinued per general surgery. Patient has been started on clears. Per general surgery.  #2 A. fib S/P failed DCCV. Chads2vasc 5. Continue IV metoprolol for rate control. Patient not on anticoagulation.  #3 rheumatoid arthritis Continue methotrexate, folic acid.  #4 history of Marfan syndrome Patient was supposed to get a to get a 2-D echo per cardiology as outpatient.  #5 compensated systolic heart failure Stable. Hold off on diuresis at this time.  #6 hypertension Continue current regimen of Norvasc and IV metoprolol.  #7 renal mass Per CT of the abdomen and pelvis with no significant change from right imaging. We'll need to discuss with daughter. Will likely need outpatient urology referral.  #8 prophylaxis Heparin for DVT prophylaxis.   Code Status: Full Family Communication: No family at bedside. Disposition Plan: Pending resolution of small bowel obstruction.   Consultants:  General surgery: Dr. Dalbert Batman 11/22/2014  Procedures:  Acute abdominal series 11/24/2014  Abdominal x-ray 11/23/2014, 11/25/14  CT abdomen and pelvis 11/22/2014  Antibiotics:  None    HPI/Subjective: Patient tolerated clears. No nausea, no emesis. Improving abdominal pain. NG tube has been removed.  Objective: Filed Vitals:   11/25/14 1300  BP: 157/75  Pulse: 87  Temp: 98 F (36.7 C)  Resp: 16    Intake/Output Summary (Last 24 hours) at 11/25/14 1454 Last data filed at 11/25/14 1422  Gross per 24 hour  Intake 4993.75 ml  Output   2001 ml  Net 2992.75 ml   Filed Weights   11/23/14 0519 11/24/14 0507 11/25/14 0614  Weight: 60.2 kg (132 lb 11.5 oz)  59.2 kg (130 lb 8.2 oz) 59.6 kg (131 lb 6.3 oz)    Exam:   General:  Patient sleeping. Chronically ill-looking. Cachectic. Hearing aids in both ears.   Cardiovascular: Regular rate and rhythm. No murmurs rubs or gallops.  Respiratory: Lungs clear to auscultation anterior lung fields.  Abdomen: Soft, nondistended, positive bowel sounds. Colostomy bag with gas and some stool.  Musculoskeletal: No clubbing cyanosis or edema.  Data Reviewed: Basic Metabolic Panel:  Recent Labs Lab 11/22/14 0835 11/23/14 0446 11/25/14 0633  NA 139 142 138  K 3.5 4.2 3.9  CL 103 104 106  CO2 27 26 20*  GLUCOSE 104* 106* 62*  BUN 12 9 10   CREATININE 0.89 0.82 0.75  CALCIUM 9.2 9.0 8.7*  MG  --   --  1.7   Liver Function Tests:  Recent Labs Lab 11/22/14 0835  AST 20  ALT 14  ALKPHOS 91  BILITOT 0.7  PROT 7.2  ALBUMIN 4.0    Recent Labs Lab 11/22/14 0835  LIPASE 27   No results for input(s): AMMONIA in the last 168 hours. CBC:  Recent Labs Lab 11/22/14 0835 11/23/14 0446 11/25/14 0633  WBC 10.8* 7.8 7.5  NEUTROABS 8.0*  --   --   HGB 12.5 12.4 11.7*  HCT 37.6 38.1 36.2  MCV 92.6 93.8 93.5  PLT 258 246 220   Cardiac Enzymes: No results for input(s): CKTOTAL, CKMB, CKMBINDEX, TROPONINI in the last 168 hours. BNP (last 3 results) No results for input(s): BNP in the last 8760 hours.  ProBNP (last 3 results)  Recent Labs  12/31/13 1116 01/04/14 0245  PROBNP 6068.0* 3355.0*    CBG:  Recent Labs Lab 11/22/14 1951 11/23/14 0003 11/23/14 0600 11/23/14 0743 11/23/14 1627  GLUCAP 92 108* 101* 108* 90    No results found for this or any previous visit (from the past 240 hour(s)).   Studies: Dg Abd 1 View  11/25/2014   CLINICAL DATA:  Check nasogastric catheter placement  EXAM: ABDOMEN - 1 VIEW  COMPARISON:  11/24/2014  FINDINGS: Scattered large and small bowel gas is noted. The degree of small bowel dilatation has resolved in the interval. Contrast material  from prior CT examination is again seen. A nasogastric catheter is again noted coiled within the stomach. Significant scoliosis concave to the right is noted with associated degenerative changes of the lumbar spine and hip joints bilaterally.  IMPRESSION: Resolution of previously seen small bowel dilatation. The nasogastric catheter is again noted within the stomach.   Electronically Signed   By: Inez Catalina M.D.   On: 11/25/2014 08:07   Dg Abd Acute W/chest  11/24/2014   CLINICAL DATA:  Small bowel obstruction, feeling better, history hypertension  EXAM: DG ABDOMEN ACUTE W/ 1V CHEST  COMPARISON:  Chest radiograph 05/04/2014, abdominal radiograph 11/23/2014, CT abdomen and pelvis 11/22/2014  FINDINGS: Nasogastric tube coiled in proximal stomach.  Borderline enlargement of cardiac silhouette.  Suspected hiatal hernia.  Mediastinal contours and pulmonary vascularity normal.  Lungs clear.  No pleural effusion or pneumothorax.  Ostomy LEFT lower quadrant.  Small amount of retained contrast.  Few dilated small bowel loops in pelvis out of proportion to colonic diameter consistent with small bowel obstruction.  No bowel wall thickening or free intraperitoneal air.  Severe osseous demineralization with advanced degenerative disc/facet disease changes of the thoracolumbar spine with significant levoconvex scoliosis.  Degenerative changes BILATERAL hip joints.  BILATERAL glenohumeral degenerative changes, advanced on RIGHT with destruction of the femoral head and glenoid, severe on LEFT with large calcified loose body inferior to the glenoid.  IMPRESSION: Persistent small bowel obstruction.  Suspected hiatal hernia.   Electronically Signed   By: Lavonia Dana M.D.   On: 11/24/2014 08:20    Scheduled Meds: . allopurinol  100 mg Oral Daily  . amLODipine  10 mg Oral Daily  . heparin  5,000 Units Subcutaneous 3 times per day  . methotrexate  25 mg Subcutaneous Weekly  . metoprolol  5 mg Intravenous 4 times per day    Continuous Infusions: . sodium chloride 75 mL/hr at 11/25/14 0230    Principal Problem:   SBO (small bowel obstruction) Active Problems:   Gouty arthropathy   Essential hypertension   Rheumatoid arthritis   Osteoporosis   Atrial flutter   Renal mass    Time spent: 35 minutes    [facility] MD [facility] Pager [phone]. If 7PM-7AM, please contact night-coverage at [email or web address], password [facility] 11/25/2014, 2:54 PM  LOS: 3 days

## 2014-11-26 LAB — BASIC METABOLIC PANEL
Anion gap: 11 (ref 5–15)
BUN: <5 mg/dL — ABNORMAL LOW (ref 6–20)
CO2: 20 mmol/L — ABNORMAL LOW (ref 22–32)
Calcium: 8.7 mg/dL — ABNORMAL LOW (ref 8.9–10.3)
Chloride: 107 mmol/L (ref 101–111)
Creatinine, Ser: 0.52 mg/dL (ref 0.44–1.00)
GFR calc Af Amer: >60 mL/min (ref >60)
GFR calc non Af Amer: >60 mL/min (ref >60)
Glucose, Bld: 87 mg/dL (ref 65–99)
Potassium: 3.9 mmol/L (ref 3.5–5.1)
Sodium: 138 mmol/L (ref 135–145)

## 2014-11-26 LAB — CBC
HCT: 34.1 % — ABNORMAL LOW (ref 36.0–46.0)
Hemoglobin: 11.4 g/dL — ABNORMAL LOW (ref 12.0–15.0)
MCH: 30.7 pg (ref 26.0–34.0)
MCHC: 33.4 g/dL (ref 30.0–36.0)
MCV: 91.9 fL (ref 78.0–100.0)
Platelets: 232 10*3/uL (ref 150–400)
RBC: 3.71 MIL/uL — ABNORMAL LOW (ref 3.87–5.11)
RDW: 13.6 % (ref 11.5–15.5)
WBC: 5.6 10*3/uL (ref 4.0–10.5)

## 2014-11-26 LAB — URINE CULTURE: Colony Count: 75000

## 2014-11-26 LAB — GLUCOSE, CAPILLARY: Glucose-Capillary: 80 mg/dL (ref 65–99)

## 2014-11-26 LAB — MAGNESIUM: Magnesium: 1.8 mg/dL (ref 1.7–2.4)

## 2014-11-26 MED ORDER — HYDROCODONE-ACETAMINOPHEN 10-325 MG PO TABS
1.0000 | ORAL_TABLET | Freq: Four times a day (QID) | ORAL | Status: DC | PRN
  Filled 2014-11-26: qty 1

## 2014-11-26 MED ORDER — TIZANIDINE HCL 4 MG PO TABS
4.0000 mg | ORAL_TABLET | Freq: Four times a day (QID) | ORAL | Status: DC | PRN
  Filled 2014-11-26: qty 1

## 2014-11-26 MED ORDER — PANTOPRAZOLE SODIUM 40 MG PO TBEC
80.0000 mg | DELAYED_RELEASE_TABLET | Freq: Every day | ORAL | Status: DC
  Administered 2014-11-26 – 2014-11-27 (×2): 80 mg via ORAL
  Filled 2014-11-26 (×2): qty 2

## 2014-11-26 MED ORDER — TRAMADOL HCL 50 MG PO TABS: 50.0000 mg | ORAL_TABLET | Freq: Four times a day (QID) | ORAL | Status: DC | PRN

## 2014-11-26 NOTE — Care Management Note (Signed)
Case Management Note  Patient Details  Name: [name] MRN: [id number] Date of Birth: 09-08-1955  Subjective/Objective:      Admitted with SBO              Action/Plan: Patient to return home with home health care services- patient chose [facility]; Sheppard Evens with [facility] called for arrangements; Patient stated that her daughter lives close by and checks on her often; No DME needed. Patient has a Producer, television/film/video and a cane.   Expected Discharge Date:     11/27/2014             Expected Discharge Plan:  [place]  Discharge planning Services  CM Consult Choice offered to:  Patient No DME needed     HH Arranged:  RN, PT, OT, Nurse's Aide [place] Agency:  [place]  Status of Service:  In process, will continue to follow   Sherrilyn Rist [phone] 11/26/2014, 3:32 PM

## 2014-11-26 NOTE — Progress Notes (Signed)
Physical Therapy Treatment Patient Details Name: [name] MRN: [id number] DOB: 1955-10-11 Today's Date: 11/26/2014    History of Present Illness 59 yo legally blind, Marfan's syndrome, HTN, CVA 2008, H/o SBO, RA, Atrial flutter. Admitted with N/V abdominal pain and SBO.     PT Comments    Pt was seen for gait and determined that she would benefit from platform walker attachments with her fragile joints in her hands, as well as collapse of arches on feet.  Might also help her direct the walker straighter and increase her safety at home.  Follow Up Recommendations  Home health PT;Supervision/Assistance - 24 hour     Equipment Recommendations  Other (comment) (B walker platform attachments)    Recommendations for Other Services       Precautions / Restrictions Precautions Precautions: Fall Precaution Comments: NG tube clamped, blind Restrictions Weight Bearing Restrictions: No    Mobility  Bed Mobility               General bed mobility comments: up bedside when PT entered  Transfers Overall transfer level: Needs assistance Equipment used: Rolling walker (2 wheeled);1 person hand held assist Transfers: Sit to/from [facility] Sit to Stand: Min guard Stand pivot transfers: Min guard;Min assist       General transfer comment: assistance in turn mainly to direct walker as pt is not following directional cues consistently  Ambulation/Gait Ambulation/Gait assistance: Min assist Ambulation Distance (Feet): 120 Feet Assistive device: Rolling walker (2 wheeled) Gait Pattern/deviations: Wide base of support;Trunk flexed;Decreased dorsiflexion - right;Decreased dorsiflexion - left;Decreased stride length;Step-through pattern (collapsed arches in feet are creating some pes planus ) Gait velocity: reduced with stops to direct walker Gait velocity interpretation: Below normal speed for age/gender General Gait Details: needed cues for unfamiliar  environment and for maintaining a straight path, and was very energetic and motivated   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[employer] (Stroke Patients Only)       Balance Overall balance assessment: Needs assistance Sitting-balance support: Feet supported Sitting balance-Leahy Scale: Good   Postural control: Posterior lean Standing balance support: Bilateral upper extremity supported Standing balance-Leahy Scale: Fair                      Cognition Arousal/Alertness: Awake/alert Behavior During Therapy: WFL for tasks assessed/performed Overall Cognitive Status: Within Functional Limits for tasks assessed                      Exercises      General Comments General comments (skin integrity, edema, etc.): has some significant joint changes of RA, would benefit from a platform walker BUE's to protect hands and ease control of the walker      Pertinent Vitals/Pain Pain Assessment: No/denies pain    Home Living                      Prior Function            PT Goals (current goals can now be found in the care plan section) Acute Rehab PT Goals Patient Stated Goal: home Progress towards PT goals: Progressing toward goals    Frequency  Min 3X/week    PT Plan Current plan remains appropriate    Co-evaluation             End of Session Equipment Utilized During Treatment: Gait belt Activity Tolerance:  Patient tolerated treatment well Patient left: in bed;with call bell/phone within reach;with bed alarm set     Time: 9480-1655 PT Time Calculation (min) (ACUTE ONLY): 33 min  Charges:  $Gait Training: 8-22 mins $Therapeutic Activity: 8-22 mins                    G Codes:      Ramond Dial 2014-12-24, 11:46 AM Mee Hives, PT MS Acute Rehab Dept. Number: [facility] [id number] and [place] [phone]

## 2014-11-26 NOTE — Progress Notes (Signed)
[facility] PROGRESS NOTE  [name] ERD:[id number] DOB: 1956-05-15 DOA: 11/22/2014 PCP: [name], MD  Assessment/Plan: #1 small bowel obstruction Patient with improvement with small bowel obstruction. Abdominal films which seems to have resolved. Patient with stool and gas in colostomy bag. NG tube has been discontinued per general surgery. Patient tolerating current diet. Per general surgery.  #2 A. fib S/P failed DCCV. Chads2vasc 5. Currently rate controlled. Patient not on anticoagulation.  #3 rheumatoid arthritis Continue methotrexate, folic acid.  #4 history of Marfan syndrome Patient was supposed to get a to get a 2-D echo per cardiology as outpatient.  #5 compensated systolic heart failure Stable. Hold off on diuresis at this time.  #6 hypertension Continue current regimen of Norvasc. DC IV metoprolol.  #7 renal mass Per CT of the abdomen and pelvis with no significant change from right imaging. Will need to discuss with daughter. Will likely need outpatient urology referral.  #8 prophylaxis Heparin for DVT prophylaxis.   Code Status: Full Family Communication: No family at bedside. Disposition Plan: Home when tolerating oral intake.   Consultants:  General surgery: Dr. Dalbert Batman 11/22/2014  Procedures:  Acute abdominal series 11/24/2014  Abdominal x-ray 11/23/2014, 11/25/14  CT abdomen and pelvis 11/22/2014  Antibiotics:  None    HPI/Subjective: Patient tolerating current diet. Patient with no emesis. Patient with no abdominal pain.  Objective: Filed Vitals:   11/26/14 1406  BP: 113/63  Pulse: 91  Temp: 99.1 F (37.3 C)  Resp: 16    Intake/Output Summary (Last 24 hours) at 11/26/14 1753 Last data filed at 11/26/14 1731  Gross per 24 hour  Intake 2452.5 ml  Output   2851 ml  Net -398.5 ml   Filed Weights   11/24/14 0507 11/25/14 0614 11/26/14 0730  Weight: 59.2 kg (130 lb 8.2 oz) 59.6 kg (131 lb 6.3 oz) 58.4 kg (128 lb 12  oz)    Exam:   General:   Chronically ill-looking. Cachectic. Hearing aids in both ears.   Cardiovascular: Regular rate and rhythm. No murmurs rubs or gallops.  Respiratory: Lungs clear to auscultation anterior lung fields.  Abdomen: Soft, nondistended, positive bowel sounds. Ventral hernia. Colostomy bag with gas and some stool.  Musculoskeletal: No clubbing cyanosis or edema.  Data Reviewed: Basic Metabolic Panel:  Recent Labs Lab 11/22/14 0835 11/23/14 0446 11/25/14 0633 11/26/14 0533  NA 139 142 138 138  K 3.5 4.2 3.9 3.9  CL 103 104 106 107  CO2 27 26 20* 20*  GLUCOSE 104* 106* 62* 87  BUN 12 9 10  <5*  CREATININE 0.89 0.82 0.75 0.52  CALCIUM 9.2 9.0 8.7* 8.7*  MG  --   --  1.7 1.8   Liver Function Tests:  Recent Labs Lab 11/22/14 0835  AST 20  ALT 14  ALKPHOS 91  BILITOT 0.7  PROT 7.2  ALBUMIN 4.0    Recent Labs Lab 11/22/14 0835  LIPASE 27   No results for input(s): AMMONIA in the last 168 hours. CBC:  Recent Labs Lab 11/22/14 0835 11/23/14 0446 11/25/14 0633 11/26/14 0533  WBC 10.8* 7.8 7.5 5.6  NEUTROABS 8.0*  --   --   --   HGB 12.5 12.4 11.7* 11.4*  HCT 37.6 38.1 36.2 34.1*  MCV 92.6 93.8 93.5 91.9  PLT 258 246 220 232   Cardiac Enzymes: No results for input(s): CKTOTAL, CKMB, CKMBINDEX, TROPONINI in the last 168 hours. BNP (last 3 results) No results for input(s): BNP in the last 8760 hours.  ProBNP (last 3 results)  Recent Labs  12/31/13 1116 01/04/14 0245  PROBNP 6068.0* 3355.0*    CBG:  Recent Labs Lab 11/23/14 0743 11/23/14 1627 11/25/14 1647 11/25/14 2241 11/26/14 0604  GLUCAP 108* 90 79 97 80    Recent Results (from the past 240 hour(s))  Culture, Urine     Status: None   Collection Time: 11/24/14 10:15 PM  Result Value Ref Range Status   Specimen Description URINE, RANDOM  Final   Special Requests NONE  Final   Colony Count   Final    75,000 COLONIES/ML Performed at [facility]     Culture   Final    Multiple bacterial morphotypes present, none predominant. Suggest appropriate recollection if clinically indicated. Performed at [facility]    Report Status 11/26/2014 FINAL  Final     Studies: Dg Abd 1 View  11/25/2014   CLINICAL DATA:  Check nasogastric catheter placement  EXAM: ABDOMEN - 1 VIEW  COMPARISON:  11/24/2014  FINDINGS: Scattered large and small bowel gas is noted. The degree of small bowel dilatation has resolved in the interval. Contrast material from prior CT examination is again seen. A nasogastric catheter is again noted coiled within the stomach. Significant scoliosis concave to the right is noted with associated degenerative changes of the lumbar spine and hip joints bilaterally.  IMPRESSION: Resolution of previously seen small bowel dilatation. The nasogastric catheter is again noted within the stomach.   Electronically Signed   By: Inez Catalina M.D.   On: 11/25/2014 08:07    Scheduled Meds: . allopurinol  100 mg Oral Daily  . amLODipine  10 mg Oral Daily  . heparin  5,000 Units Subcutaneous 3 times per day  . methotrexate  25 mg Subcutaneous Weekly  . pantoprazole  80 mg Oral Q1200   Continuous Infusions:    Principal Problem:   SBO (small bowel obstruction) Active Problems:   Gouty arthropathy   Essential hypertension   Rheumatoid arthritis   Osteoporosis   Atrial flutter   Renal mass    Time spent: 35 minutes    [facility] MD [facility] Pager [phone]. If 7PM-7AM, please contact night-coverage at [email or web address], password [facility] 11/26/2014, 5:53 PM  LOS: 4 days

## 2014-11-26 NOTE — Progress Notes (Signed)
Patient ID: [name], female   DOB: 01-19-1956, 59 y.o.   MRN: [id number]     [facility]      [address] [place], [place] [address]    Phone: [phone] FAX: [phone]     Subjective: Hungry.  Colostomy functioning.  Denies pain, nausea or vomiting.   Objective:  Vital signs:  Filed Vitals:   11/25/14 2055 11/25/14 2324 11/26/14 0549 11/26/14 0730  BP: 146/75 125/84 143/92   Pulse: 70 80 75   Temp: 98.2 F (36.8 C)  99 F (37.2 C)   TempSrc: Oral  Oral   Resp: 16  16   Height:      Weight:    58.4 kg (128 lb 12 oz)  SpO2: 98%  99%     Last BM Date: 11/25/14  Intake/Output   Yesterday:  06/02 0701 - 06/03 0700 In: 2326 [P.O.:420; I.V.:1800; IV Piggyback:106] Out: 3751 [Urine:3400; Emesis/NG output:350; Stool:1] This shift: I/O last 3 completed shifts: In: 7319.8 [P.O.:480; I.V.:6733.8; IV Piggyback:106] Out: 4302 [Urine:3950; Emesis/NG output:350; Stool:2]    Physical Exam: General: Pt awake/alert/oriented x4 in no acute distress Abdomen: Soft. Nondistended. Ventral hernia is soft and reducible. Colostomy is functional. No evidence of peritonitis.   Problem List:   Principal Problem:   SBO (small bowel obstruction) Active Problems:   Gouty arthropathy   Essential hypertension   Rheumatoid arthritis   Osteoporosis   Atrial flutter   Renal mass    Results:   Labs: Results for orders placed or performed during the hospital encounter of 11/22/14 (from the past 48 hour(s))  Culture, Urine     Status: None   Collection Time: 11/24/14 10:15 PM  Result Value Ref Range   Specimen Description URINE, RANDOM    Special Requests NONE    Colony Count      75,000 COLONIES/ML Performed at [facility]    Culture      Multiple bacterial morphotypes present, none predominant. Suggest appropriate recollection if clinically indicated. Performed at [facility]    Report Status  11/26/2014 FINAL   Basic metabolic panel     Status: Abnormal   Collection Time: 11/25/14  6:33 AM  Result Value Ref Range   Sodium 138 135 - 145 mmol/L   Potassium 3.9 3.5 - 5.1 mmol/L   Chloride 106 101 - 111 mmol/L   CO2 20 (L) 22 - 32 mmol/L   Glucose, Bld 62 (L) 65 - 99 mg/dL   BUN 10 6 - 20 mg/dL   Creatinine, Ser 0.75 0.44 - 1.00 mg/dL   Calcium 8.7 (L) 8.9 - 10.3 mg/dL   GFR calc non Af Amer >60 >60 mL/min   GFR calc Af Amer >60 >60 mL/min    Comment: (NOTE) The eGFR has been calculated using the CKD EPI equation. This calculation has not been validated in all clinical situations. eGFR's persistently <60 mL/min signify possible Chronic Kidney Disease.    Anion gap 12 5 - 15  CBC     Status: Abnormal   Collection Time: 11/25/14  6:33 AM  Result Value Ref Range   WBC 7.5 4.0 - 10.5 K/uL   RBC 3.87 3.87 - 5.11 MIL/uL   Hemoglobin 11.7 (L) 12.0 - 15.0 g/dL   HCT 36.2 36.0 - 46.0 %   MCV 93.5 78.0 - 100.0 fL   MCH 30.2 26.0 - 34.0 pg   MCHC 32.3 30.0 - 36.0 g/dL  RDW 13.7 11.5 - 15.5 %   Platelets 220 150 - 400 K/uL  Magnesium     Status: None   Collection Time: 11/25/14  6:33 AM  Result Value Ref Range   Magnesium 1.7 1.7 - 2.4 mg/dL  Glucose, capillary     Status: None   Collection Time: 11/25/14  4:47 PM  Result Value Ref Range   Glucose-Capillary 79 65 - 99 mg/dL  Glucose, capillary     Status: None   Collection Time: 11/25/14 10:41 PM  Result Value Ref Range   Glucose-Capillary 97 65 - 99 mg/dL  Basic metabolic panel     Status: Abnormal   Collection Time: 11/26/14  5:33 AM  Result Value Ref Range   Sodium 138 135 - 145 mmol/L   Potassium 3.9 3.5 - 5.1 mmol/L   Chloride 107 101 - 111 mmol/L   CO2 20 (L) 22 - 32 mmol/L   Glucose, Bld 87 65 - 99 mg/dL   BUN <5 (L) 6 - 20 mg/dL   Creatinine, Ser 0.52 0.44 - 1.00 mg/dL   Calcium 8.7 (L) 8.9 - 10.3 mg/dL   GFR calc non Af Amer >60 >60 mL/min   GFR calc Af Amer >60 >60 mL/min    Comment: (NOTE) The eGFR  has been calculated using the CKD EPI equation. This calculation has not been validated in all clinical situations. eGFR's persistently <60 mL/min signify possible Chronic Kidney Disease.    Anion gap 11 5 - 15  Magnesium     Status: None   Collection Time: 11/26/14  5:33 AM  Result Value Ref Range   Magnesium 1.8 1.7 - 2.4 mg/dL  Glucose, capillary     Status: None   Collection Time: 11/26/14  6:04 AM  Result Value Ref Range   Glucose-Capillary 80 65 - 99 mg/dL   Comment 1 Notify RN    Comment 2 Document in Chart     Imaging / Studies: Dg Abd 1 View  11/25/2014   CLINICAL DATA:  Check nasogastric catheter placement  EXAM: ABDOMEN - 1 VIEW  COMPARISON:  11/24/2014  FINDINGS: Scattered large and small bowel gas is noted. The degree of small bowel dilatation has resolved in the interval. Contrast material from prior CT examination is again seen. A nasogastric catheter is again noted coiled within the stomach. Significant scoliosis concave to the right is noted with associated degenerative changes of the lumbar spine and hip joints bilaterally.  IMPRESSION: Resolution of previously seen small bowel dilatation. The nasogastric catheter is again noted within the stomach.   Electronically Signed   By: Inez Catalina M.D.   On: 11/25/2014 08:07    Medications / Allergies:  Scheduled Meds: . allopurinol  100 mg Oral Daily  . amLODipine  10 mg Oral Daily  . heparin  5,000 Units Subcutaneous 3 times per day  . methotrexate  25 mg Subcutaneous Weekly  . metoprolol  5 mg Intravenous 4 times per day   Continuous Infusions: . sodium chloride 75 mL/hr at 11/26/14 0540   PRN Meds:.acetaminophen **OR** acetaminophen, albuterol, bisacodyl, hydrALAZINE, morphine injection, ondansetron **OR** ondansetron (ZOFRAN) IV, polyethylene glycol, sodium chloride  Antibiotics: Anti-infectives    None       Assessment/Plan Recurrent SBO s/p multiple abdominal surgeries, partial colectomy/colostomy, last  SBO 05/01/14. Dr. Johney Maine did her last surgery and is following currently. Large ventral hernia Resolved Hernia remains soft and reducible  VTE prophylaxis-SCD/heparin FEN-fulls for breakfast, soft for lunch Dispo-per primary team.  Follow up with Dr.  Johney Maine.  Surgery signing off, please call with questions or concerns.   Erby Pian, [facility] Surgery Pager [phone]) For consults and floor pages call [phone])  11/26/2014 8:25 AM

## 2014-11-27 LAB — BASIC METABOLIC PANEL
Anion gap: 7 (ref 5–15)
BUN: <5 mg/dL — ABNORMAL LOW (ref 6–20)
CO2: 24 mmol/L (ref 22–32)
Calcium: 9.1 mg/dL (ref 8.9–10.3)
Chloride: 109 mmol/L (ref 101–111)
Creatinine, Ser: 0.65 mg/dL (ref 0.44–1.00)
GFR calc Af Amer: >60 mL/min (ref >60)
GFR calc non Af Amer: >60 mL/min (ref >60)
Glucose, Bld: 98 mg/dL (ref 65–99)
Potassium: 4.3 mmol/L (ref 3.5–5.1)
Sodium: 140 mmol/L (ref 135–145)

## 2014-11-27 NOTE — Discharge Summary (Signed)
Physician Discharge Summary  [name] YQM:[id number] DOB: 01-Mar-1956 DOA: 11/22/2014  PCP: [name], MD  Admit date: 11/22/2014 Discharge date: 11/27/2014  Time spent: 65 minutes  Recommendations for Outpatient Follow-up:  1. Follow-up with FRY,STEPHEN A, MD in 1 week.On follow-up patient need a basic metabolic profile done to follow-up on electrolytes and renal function. Patient need a CBC done to follow-up on H&H. 2. Patient to follow-up with urology as outpatient for further evaluation of renal mass noted on CT.  Discharge Diagnoses:  Principal Problem:   SBO (small bowel obstruction) Active Problems:   Gouty arthropathy   Essential hypertension   Rheumatoid arthritis   Osteoporosis   Atrial flutter   Renal mass   Discharge Condition: Stable and improved  Diet recommendation: Regular soft diet.  Filed Weights   11/25/14 0614 11/26/14 0730 11/27/14 0421  Weight: 59.6 kg (131 lb 6.3 oz) 58.4 kg (128 lb 12 oz) 57.9 kg (127 lb 10.3 oz)    History of present illness:  Per Dr Waldron Labs Byrd Terrero is a 59 y.o. 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 Patient presented with complaints of abdominal pain, nausea and vomiting, patient has history of small bowel obstruction in the past, most recent of November 2015, which was managed conservatively with NG tube on bowel rest,Patient with history of multiple abdominal surgeries, has colostomy, start abdominal pain started this a.m. when she woke up, had nausea, one episode of vomiting, nonbilious,  nonbloody, in ED CT abdomen and pelvis showed small bowel obstruction,Specialists requested to admit the patient.Marland Kitchen  Hospital Course:  #1 small bowel obstruction Patient was admitted with a small bowel obstruction. Patient was made nothing by mouth. General surgery was consulted. An NG tube was placed. Patient was managed conservatively with IV fluids. Patient's electrolytes were repleted. Patient had serial abdominal films which showed improvement and subsequently resolution of patient's small bowel obstruction. NG tube was clamped and subsequently removed. Patient was started on clear liquids and diet advanced to a soft diet which she tolerated. Patient had good output in the colostomy bag. Patient did not have any further nausea vomiting and was in stable and improved condition by day of discharge. Patient will be discharged home in stable and improved condition.   #2 A. fib S/P failed DCCV. Chads2vasc 5. Patient remained rate controlled during the hospitalization on IV Lopressor. Patient not on anticoagulation.  #3 rheumatoid arthritis Continued on methotrexate, folic acid.  #4 history of Marfan syndrome Patient was supposed to get a to get a 2-D echo per cardiology as outpatient.  #5 compensated systolic heart failure Stable. Patient is diabetic so held during the hospitalization resumed on discharge.   #6 hypertension Patient during the hospitalization was made nothing by mouth secondary to small bowel obstruction. Patient was maintained on IV metoprolol during the hospitalization. Patient will be resumed on her home regimen on discharge.   #7 renal mass Per CT of the abdomen and pelvis with no significant change from right imaging. Will likely need outpatient urology referral.   Procedures:  Acute abdominal series 11/24/2014  Abdominal x-ray 11/23/2014, 11/25/14  CT abdomen and pelvis 11/22/2014    Consultations:  General surgery: Dr. Dalbert Batman  11/22/2014  Discharge  Exam: Filed Vitals:   11/27/14 1407  BP: 108/70  Pulse: 75  Temp: 98.2 F (36.8 C)  Resp: 16    General: NAD Cardiovascular: RRR Respiratory: CTAB anterior lung fields.  Discharge Instructions   Discharge Instructions    Diet general    Complete by:  As directed      Discharge instructions    Complete by:  As directed   Follow up with FRY,STEPHEN A, MD in 1 week.     Increase activity slowly    Complete by:  As directed           Current Discharge Medication List    CONTINUE these medications which have NOT CHANGED   Details  albuterol (PROVENTIL HFA;VENTOLIN HFA) 108 (90 BASE) MCG/ACT inhaler Inhale 1 puff into the lungs every 4 (four) hours as needed for wheezing or shortness of breath. Qty: 1 Inhaler, Refills: 11    allopurinol (ZYLOPRIM) 100 MG tablet take 1 tablet by mouth once daily Qty: 30 tablet, Refills: 3    amLODipine (NORVASC) 10 MG tablet take 1 tablet by mouth once daily Qty: 30 tablet, Refills: 3    aspirin EC 81 MG tablet Take 1 tablet (81 mg total) by mouth daily.    Calcium Carbonate-Vitamin D (CALCIUM + D PO) Take 1,200 mg by mouth daily.    cetirizine (ZYRTEC) 10 MG tablet Take 1 tablet (10 mg total) by mouth daily. Qty: 90 tablet, Refills: 1    ciprofloxacin-dexamethasone (CIPRODEX) otic suspension Place 4 drops into both ears 2 (two) times daily. Qty: 7.5 mL, Refills: 11    Cyanocobalamin (VITAMIN B 12 PO) Take 1 tablet by mouth daily.    folic acid (FOLVITE) 1 MG tablet Take 1 mg by mouth daily.    HYDROcodone-acetaminophen (NORCO) 10-325 MG per tablet Take 1 tablet by mouth every 6 (six) hours as needed for moderate pain. Qty: 120 tablet, Refills: 0    ketoconazole (NIZORAL) 2 % cream Apply 1 application topically 2 (two) times daily. Qty: 30 g, Refills: 5    MEGACE ES 625 MG/5ML suspension TAKE 5 ML'S BY MOUTH TWICE DAILY Qty: 150 mL, Refills: 5    methotrexate 25 MG/ML injection Inject 25 mg into the skin once a week. Take on  Mondays    Multiple Vitamins-Minerals (MULTIVITAMIN PO) Take 1 tablet by mouth daily.     NEXIUM 40 MG capsule take 1 capsule by mouth twice a day Qty: 60 capsule, Refills: 11    polyethylene glycol (MIRALAX / GLYCOLAX) packet Take 17 g by mouth daily. Qty: 14 each, Refills: 0    pyridOXINE (VITAMIN B-6) 50 MG tablet Take 50 mg by mouth daily.    risedronate (ACTONEL) 150 MG tablet Take 150 mg by mouth every 30 (thirty) days. with water on empty stomach, nothing by mouth or lie down for next 30 minutes.    tiZANidine (ZANAFLEX) 4 MG tablet Take 1 tablet (4 mg total) by mouth every 6 (six) hours as needed for muscle spasms. Qty: 90 tablet, Refills: 3    traMADol (ULTRAM) 50 MG tablet take 1 to 2 tablets by mouth every 6 hours if needed for pain Qty: 120 tablet, Refills: 5    vitamin C (ASCORBIC ACID) 500 MG tablet Take 500 mg by mouth daily.    promethazine (PHENERGAN) 25 MG tablet Take 1 tablet (25 mg total) by mouth every 6 (six) hours as needed for nausea or vomiting. Qty: 60 tablet, Refills: 2  STOP taking these medications     EPINEPHrine (EPIPEN 2-PAK) 0.3 mg/0.3 mL IJ SOAJ injection        No Known Allergies Follow-up Information    Follow up with [facility]   Specialty:  [facility]   Why:  they will do your home health care at your home   Contact information:   [place] [address] [phone]       Follow up with FRY,STEPHEN A, MD. Schedule an appointment as soon as possible for a visit in 1 week.   Specialty:  Family Medicine   Contact information:   [place] [address] [phone]       Follow up with [facility]. Schedule an appointment as soon as possible for a visit in 2 weeks.   Contact information:   [address]  [place] [address] [address] [phone]        The results of significant diagnostics from this hospitalization (including imaging,  microbiology, ancillary and laboratory) are listed below for reference.    Significant Diagnostic Studies: Dg Abd 1 View  11/25/2014   CLINICAL DATA:  Check nasogastric catheter placement  EXAM: ABDOMEN - 1 VIEW  COMPARISON:  11/24/2014  FINDINGS: Scattered large and small bowel gas is noted. The degree of small bowel dilatation has resolved in the interval. Contrast material from prior CT examination is again seen. A nasogastric catheter is again noted coiled within the stomach. Significant scoliosis concave to the right is noted with associated degenerative changes of the lumbar spine and hip joints bilaterally.  IMPRESSION: Resolution of previously seen small bowel dilatation. The nasogastric catheter is again noted within the stomach.   Electronically Signed   By: Inez Catalina M.D.   On: 11/25/2014 08:07   Dg Abd 1 View  11/22/2014   CLINICAL DATA:  Small bowel obstruction, nasogastric tube placement  EXAM: NASO G TUBE PLACEMENT WITH FL AND WITH RAD; ABDOMEN - 1 VIEW  FLUOROSCOPY TIME:  Radiation Exposure Index (as provided by the fluoroscopic device): Dose area product 45.02  COMPARISON:  CT abdomen pelvis dated 11/22/2014  FINDINGS: Enteric tube terminates in the gastric fundus.  IMPRESSION: Enteric tube terminates in the gastric fundus.   Electronically Signed   By: Julian Hy M.D.   On: 11/22/2014 13:58   Ct Abdomen Pelvis W Contrast  11/22/2014   CLINICAL DATA:  Diffuse abdominal pain.  Colostomy.  EXAM: CT ABDOMEN AND PELVIS WITH CONTRAST  TECHNIQUE: Multidetector CT imaging of the abdomen and pelvis was performed using the standard protocol following bolus administration of intravenous contrast.  CONTRAST:  82mL OMNIPAQUE IOHEXOL 300 MG/ML  SOLN  COMPARISON:  05/01/2014  FINDINGS: Lower chest: There is no pericardial effusion. Calcified atherosclerotic disease and involves the thoracic and abdominal aorta as well as the LAD and left circumflex coronary artery.  Hepatobiliary: There is a  cyst within the left lobe of liver measuring 2.2 cm, image 24 of series 201. Several smaller sister noted within the left lobe. The gallbladder is normal. The common bile duct has a maximum diameter of 8 mm. Unchanged from previous exam.  Pancreas: There is increase caliber of the distal pancreatic duct, unchanged from previous exam measuring up to 7 mm. No mass or obstructing stone identified.  Spleen: Negative  Adrenals/Urinary Tract: The adrenal glands are negative. The right kidney appears normal. Hyperdense lesion arising from the posterior right kidney measures 1.7 cm. This remains  concerning for a small renal cell carcinoma. The urinary bladder appears normal.  Stomach/Bowel: The stomach is normal. The small bowel loops are increased in caliber measuring up to 3.5 cm, image 51 of series 201. There is a transition to decreased caliber mid and distal small bowel loops within the left abdomen, image number 39 of series 202 and image number 50 of series 201. There is gas and stool identified within the colon. The patient has a left sided colostomy.  Vascular/Lymphatic: Calcified atherosclerotic disease involves the abdominal aorta. No aneurysm. No enlarged retroperitoneal or mesenteric adenopathy. No enlarged pelvic or inguinal lymph nodes.  Reproductive: The uterus and the adnexal structures are unremarkable.  Other: No free fluid or fluid collections identified within the abdomen or pelvis.  Musculoskeletal: Scoliosis deformity involves the thoracic and lumbar spine. There is multi level degenerative disc disease identified throughout the thoracic and lumbar spine.  IMPRESSION: 1. Examination is positive for small bowel obstruction. The transition point is in the left abdomen likely at the level of the mid jejunum or proximal ileum. This may be secondary to adhesions or stricture. No perforation or abscess. 2. Left lower quadrant colostomy. 3. Liver cysts. 4. Hyperdense lesion involving the posterior left  kidney compatible with previously demonstrated solid enhancing mass. This is worrisome for renal cell carcinoma.   Electronically Signed   By: Kerby Moors M.D.   On: 11/22/2014 10:14   Dg Abd Acute W/chest  11/24/2014   CLINICAL DATA:  Small bowel obstruction, feeling better, history hypertension  EXAM: DG ABDOMEN ACUTE W/ 1V CHEST  COMPARISON:  Chest radiograph 05/04/2014, abdominal radiograph 11/23/2014, CT abdomen and pelvis 11/22/2014  FINDINGS: Nasogastric tube coiled in proximal stomach.  Borderline enlargement of cardiac silhouette.  Suspected hiatal hernia.  Mediastinal contours and pulmonary vascularity normal.  Lungs clear.  No pleural effusion or pneumothorax.  Ostomy LEFT lower quadrant.  Small amount of retained contrast.  Few dilated small bowel loops in pelvis out of proportion to colonic diameter consistent with small bowel obstruction.  No bowel wall thickening or free intraperitoneal air.  Severe osseous demineralization with advanced degenerative disc/facet disease changes of the thoracolumbar spine with significant levoconvex scoliosis.  Degenerative changes BILATERAL hip joints.  BILATERAL glenohumeral degenerative changes, advanced on RIGHT with destruction of the femoral head and glenoid, severe on LEFT with large calcified loose body inferior to the glenoid.  IMPRESSION: Persistent small bowel obstruction.  Suspected hiatal hernia.   Electronically Signed   By: Lavonia Dana M.D.   On: 11/24/2014 08:20   Dg Abd Portable 1v  11/23/2014   CLINICAL DATA:  Abdominal distension. Evaluate NG tube. Small-bowel obstruction.  EXAM: PORTABLE ABDOMEN - 1 VIEW  COMPARISON:  11/22/2014; 11/22/2014; CT abdomen pelvis - 11/22/2014  FINDINGS: Enteric tube tip and side port projects over the expected location of the gastric fundus.  On ostomy overlies the left lower abdominal quadrant.  Moderate colonic stool burden without evidence of obstruction.  No supine evidence of pneumoperitoneum. No definite  pneumatosis or portal venous gas.  Excreted contrast is seen within the urinary bladder.  Old fracture involving the right inferior pubic ramus. Advanced degenerative change of the bilateral hips.  IMPRESSION: 1. Paucity of bowel gas out evidence of obstruction. 2. Enteric tube tip and side port projected the expected location of the gastric fundus.   Electronically Signed   By: Sandi Mariscal M.D.   On: 11/23/2014 08:03   Dg Loyce Dys Tube Plc W/fl W/rad  11/22/2014  CLINICAL DATA:  NG tube placement for stomach decompression  EXAM: NASO G TUBE PLACEMENT WITH FL AND WITH RAD  FLUOROSCOPY TIME:  Radiation Exposure Index (as provided by the fluoroscopic device): Dose area product 26.35  COMPARISON:  11/22/2014 at 1429 hr  FINDINGS: Enteric tube terminates in the gastric body.  IMPRESSION: Enteric tube terminates in the gastric body.   Electronically Signed   By: Julian Hy M.D.   On: 11/22/2014 17:02   Dg Loyce Dys Tube Plc W/fl W/rad  11/22/2014   CLINICAL DATA:  Small bowel obstruction, nasogastric tube placement  EXAM: NASO G TUBE PLACEMENT WITH FL AND WITH RAD; ABDOMEN - 1 VIEW  FLUOROSCOPY TIME:  Radiation Exposure Index (as provided by the fluoroscopic device): Dose area product 45.02  COMPARISON:  CT abdomen pelvis dated 11/22/2014  FINDINGS: Enteric tube terminates in the gastric fundus.  IMPRESSION: Enteric tube terminates in the gastric fundus.   Electronically Signed   By: Julian Hy M.D.   On: 11/22/2014 13:58    Microbiology: Recent Results (from the past 240 hour(s))  Culture, Urine     Status: None   Collection Time: 11/24/14 10:15 PM  Result Value Ref Range Status   Specimen Description URINE, RANDOM  Final   Special Requests NONE  Final   Colony Count   Final    75,000 COLONIES/ML Performed at [facility]    Culture   Final    Multiple bacterial morphotypes present, none predominant. Suggest appropriate recollection if clinically indicated. Performed at [facility]    Report Status 11/26/2014 FINAL  Final     Labs: Basic Metabolic Panel:  Recent Labs Lab 11/22/14 0835 11/23/14 0446 11/25/14 0633 11/26/14 0533 11/27/14 0317  NA 139 142 138 138 140  K 3.5 4.2 3.9 3.9 4.3  CL 103 104 106 107 109  CO2 27 26 20* 20* 24  GLUCOSE 104* 106* 62* 87 98  BUN 12 9 10  <5* <5*  CREATININE 0.89 0.82 0.75 0.52 0.65  CALCIUM 9.2 9.0 8.7* 8.7* 9.1  MG  --   --  1.7 1.8  --    Liver Function Tests:  Recent Labs Lab 11/22/14 0835  AST 20  ALT 14  ALKPHOS 91  BILITOT 0.7  PROT 7.2  ALBUMIN 4.0    Recent Labs Lab 11/22/14 0835  LIPASE 27   No results for input(s): AMMONIA in the last 168 hours. CBC:  Recent Labs Lab 11/22/14 0835 11/23/14 0446 11/25/14 0633 11/26/14 0533  WBC 10.8* 7.8 7.5 5.6  NEUTROABS 8.0*  --   --   --   HGB 12.5 12.4 11.7* 11.4*  HCT 37.6 38.1 36.2 34.1*  MCV 92.6 93.8 93.5 91.9  PLT 258 246 220 232   Cardiac Enzymes: No results for input(s): CKTOTAL, CKMB, CKMBINDEX, TROPONINI in the last 168 hours. BNP: BNP (last 3 results) No results for input(s): BNP in the last 8760 hours.  ProBNP (last 3 results)  Recent Labs  12/31/13 1116 01/04/14 0245  PROBNP 6068.0* 3355.0*    CBG:  Recent Labs Lab 11/23/14 0743 11/23/14 1627 11/25/14 1647 11/25/14 2241 11/26/14 0604  GLUCAP 108* 90 79 97 80       Signed:  THOMPSON,DANIEL MD [facility] 11/27/2014, 5:58 PM

## 2014-11-27 NOTE — Progress Notes (Signed)
Pt. Discharge home with daughter. Instructions given to daughter verbalized understanding. Iv and tele removed from patient. Belongings sent with patient. Taken out via wheelchair.

## 2014-11-28 DIAGNOSIS — M129 Arthropathy, unspecified: Secondary | ICD-10-CM | POA: Diagnosis not present

## 2014-11-29 DIAGNOSIS — M129 Arthropathy, unspecified: Secondary | ICD-10-CM | POA: Diagnosis not present

## 2014-11-30 DIAGNOSIS — M129 Arthropathy, unspecified: Secondary | ICD-10-CM | POA: Diagnosis not present

## 2014-12-01 DIAGNOSIS — M129 Arthropathy, unspecified: Secondary | ICD-10-CM | POA: Diagnosis not present

## 2014-12-02 DIAGNOSIS — M129 Arthropathy, unspecified: Secondary | ICD-10-CM | POA: Diagnosis not present

## 2014-12-03 ENCOUNTER — Telehealth: Payer: Self-pay | Admitting: Family Medicine

## 2014-12-03 DIAGNOSIS — M129 Arthropathy, unspecified: Secondary | ICD-10-CM | POA: Diagnosis not present

## 2014-12-03 MED ORDER — POLYETHYLENE GLYCOL 3350 17 GM/SCOOP PO POWD: 17.0000 g | Freq: Two times a day (BID) | ORAL | Status: DC | PRN

## 2014-12-03 NOTE — Telephone Encounter (Signed)
Pt would like a Rx of Miralax called to [facility] on [address]. Her insurance will pay for it if it is called in as a Rx.

## 2014-12-03 NOTE — Telephone Encounter (Signed)
Call in Miralax to take 17 gm bid, one year supply

## 2014-12-03 NOTE — Telephone Encounter (Signed)
Ok to send

## 2014-12-03 NOTE — Telephone Encounter (Signed)
Rx sent to pharmacy.  Called and spoke with pt and pt is aware.

## 2014-12-04 DIAGNOSIS — M129 Arthropathy, unspecified: Secondary | ICD-10-CM | POA: Diagnosis not present

## 2014-12-05 DIAGNOSIS — M129 Arthropathy, unspecified: Secondary | ICD-10-CM | POA: Diagnosis not present

## 2014-12-06 DIAGNOSIS — M129 Arthropathy, unspecified: Secondary | ICD-10-CM | POA: Diagnosis not present

## 2014-12-07 DIAGNOSIS — K5909 Other constipation: Secondary | ICD-10-CM | POA: Diagnosis not present

## 2014-12-07 DIAGNOSIS — Z933 Colostomy status: Secondary | ICD-10-CM | POA: Diagnosis not present

## 2014-12-07 DIAGNOSIS — K432 Incisional hernia without obstruction or gangrene: Secondary | ICD-10-CM | POA: Diagnosis not present

## 2014-12-07 DIAGNOSIS — M129 Arthropathy, unspecified: Secondary | ICD-10-CM | POA: Diagnosis not present

## 2014-12-08 DIAGNOSIS — M129 Arthropathy, unspecified: Secondary | ICD-10-CM | POA: Diagnosis not present

## 2014-12-09 DIAGNOSIS — Z933 Colostomy status: Secondary | ICD-10-CM | POA: Diagnosis not present

## 2014-12-09 DIAGNOSIS — M129 Arthropathy, unspecified: Secondary | ICD-10-CM | POA: Diagnosis not present

## 2014-12-10 ENCOUNTER — Ambulatory Visit: Payer: Medicare Other | Admitting: Family Medicine

## 2014-12-10 DIAGNOSIS — M129 Arthropathy, unspecified: Secondary | ICD-10-CM | POA: Diagnosis not present

## 2014-12-11 DIAGNOSIS — M129 Arthropathy, unspecified: Secondary | ICD-10-CM | POA: Diagnosis not present

## 2014-12-12 DIAGNOSIS — M129 Arthropathy, unspecified: Secondary | ICD-10-CM | POA: Diagnosis not present

## 2014-12-13 DIAGNOSIS — M0589 Other rheumatoid arthritis with rheumatoid factor of multiple sites: Secondary | ICD-10-CM | POA: Diagnosis not present

## 2014-12-13 DIAGNOSIS — M129 Arthropathy, unspecified: Secondary | ICD-10-CM | POA: Diagnosis not present

## 2014-12-14 ENCOUNTER — Telehealth: Payer: Self-pay | Admitting: Family Medicine

## 2014-12-14 DIAGNOSIS — M129 Arthropathy, unspecified: Secondary | ICD-10-CM | POA: Diagnosis not present

## 2014-12-14 NOTE — Telephone Encounter (Signed)
FYI Tiffany from [facility] is calling to report pt was [facility]  November 27 2014. Dx other intestinal obstruction.

## 2014-12-15 DIAGNOSIS — M129 Arthropathy, unspecified: Secondary | ICD-10-CM | POA: Diagnosis not present

## 2014-12-16 DIAGNOSIS — M129 Arthropathy, unspecified: Secondary | ICD-10-CM | POA: Diagnosis not present

## 2014-12-16 NOTE — Telephone Encounter (Signed)
noted 

## 2014-12-17 DIAGNOSIS — M129 Arthropathy, unspecified: Secondary | ICD-10-CM | POA: Diagnosis not present

## 2014-12-18 DIAGNOSIS — M129 Arthropathy, unspecified: Secondary | ICD-10-CM | POA: Diagnosis not present

## 2014-12-19 DIAGNOSIS — M129 Arthropathy, unspecified: Secondary | ICD-10-CM | POA: Diagnosis not present

## 2014-12-20 ENCOUNTER — Ambulatory Visit (INDEPENDENT_AMBULATORY_CARE_PROVIDER_SITE_OTHER): Payer: Medicare Other | Admitting: Family Medicine

## 2014-12-20 ENCOUNTER — Encounter: Payer: Self-pay | Admitting: Family Medicine

## 2014-12-20 VITALS — BP 120/80 | HR 74 | Temp 98.5°F

## 2014-12-20 DIAGNOSIS — N2889 Other specified disorders of kidney and ureter: Secondary | ICD-10-CM

## 2014-12-20 DIAGNOSIS — K5669 Other intestinal obstruction: Secondary | ICD-10-CM | POA: Diagnosis not present

## 2014-12-20 DIAGNOSIS — K56609 Unspecified intestinal obstruction, unspecified as to partial versus complete obstruction: Secondary | ICD-10-CM

## 2014-12-20 DIAGNOSIS — I10 Essential (primary) hypertension: Secondary | ICD-10-CM

## 2014-12-20 DIAGNOSIS — N179 Acute kidney failure, unspecified: Secondary | ICD-10-CM | POA: Diagnosis not present

## 2014-12-20 DIAGNOSIS — D509 Iron deficiency anemia, unspecified: Secondary | ICD-10-CM

## 2014-12-20 DIAGNOSIS — M129 Arthropathy, unspecified: Secondary | ICD-10-CM | POA: Diagnosis not present

## 2014-12-20 LAB — CBC WITH DIFFERENTIAL/PLATELET
Basophils Absolute: 0 10*3/uL (ref 0.0–0.1)
Basophils Relative: 0.3 % (ref 0.0–3.0)
Eosinophils Absolute: 0.1 10*3/uL (ref 0.0–0.7)
Eosinophils Relative: 2.2 % (ref 0.0–5.0)
HCT: 35.4 % — ABNORMAL LOW (ref 36.0–46.0)
Hemoglobin: 11.9 g/dL — ABNORMAL LOW (ref 12.0–15.0)
Lymphocytes Relative: 32.3 % (ref 12.0–46.0)
Lymphs Abs: 1.8 10*3/uL (ref 0.7–4.0)
MCHC: 33.7 g/dL (ref 30.0–36.0)
MCV: 92.8 fl (ref 78.0–100.0)
Monocytes Absolute: 0.5 10*3/uL (ref 0.1–1.0)
Monocytes Relative: 8.3 % (ref 3.0–12.0)
Neutro Abs: 3.2 10*3/uL (ref 1.4–7.7)
Neutrophils Relative %: 56.9 % (ref 43.0–77.0)
Platelets: 202 10*3/uL (ref 150.0–400.0)
RBC: 3.81 Mil/uL — ABNORMAL LOW (ref 3.87–5.11)
RDW: 14.2 % (ref 11.5–15.5)
WBC: 5.7 10*3/uL (ref 4.0–10.5)

## 2014-12-20 LAB — BASIC METABOLIC PANEL
BUN: 12 mg/dL (ref 6–23)
CO2: 27 mEq/L (ref 19–32)
Calcium: 9.6 mg/dL (ref 8.4–10.5)
Chloride: 107 mEq/L (ref 96–112)
Creatinine, Ser: 0.74 mg/dL (ref 0.40–1.20)
GFR: 103.39 mL/min (ref >60.00)
Glucose, Bld: 95 mg/dL (ref 70–99)
Potassium: 4.2 mEq/L (ref 3.5–5.1)
Sodium: 141 mEq/L (ref 135–145)

## 2014-12-20 NOTE — Progress Notes (Signed)
   Subjective:    Patient ID: [name], female    DOB: Jan 25, 1956, 59 y.o.   MRN: [id number]  HPI Here with her daughter to follow up a hospital stay from 11-22-14 to 11-27-14 for a small bowel obstruction. She presented with abdominal bloating and nausea, and she was treated with gut rest. She had an NG tube and was NPO for several days. She responded well to this and was sent home on a normal oral diet. She has done well since then, her appetite is normal and she is passing stool normally. Her BP is stable. An appt was made for her to see Dr. Diona Fanti on 01-03-15 to follow up on a left renal mass.    Review of Systems  Constitutional: Negative.   Respiratory: Negative.   Cardiovascular: Negative.   Gastrointestinal: Negative.        Objective:   Physical Exam  Constitutional: She appears well-developed and well-nourished.  Neck: No thyromegaly present.  Cardiovascular: Normal rate, regular rhythm, normal heart sounds and intact distal pulses.   Pulmonary/Chest: Effort normal and breath sounds normal.  Abdominal: Soft. Bowel sounds are normal. She exhibits no distension and no mass. There is no tenderness. There is no rebound and no guarding.  Lymphadenopathy:    She has no cervical adenopathy.          Assessment & Plan:  Her SBO appears to have resolved. We will get labs to day to follow up on anemia and renal disease.

## 2014-12-20 NOTE — Progress Notes (Signed)
Pre visit review using our clinic review tool, if applicable. No additional management support is needed unless otherwise documented below in the visit note. Pt unable to stand and weigh.   

## 2014-12-21 DIAGNOSIS — M2550 Pain in unspecified joint: Secondary | ICD-10-CM | POA: Diagnosis not present

## 2014-12-21 DIAGNOSIS — M129 Arthropathy, unspecified: Secondary | ICD-10-CM | POA: Diagnosis not present

## 2014-12-21 DIAGNOSIS — M1A09X0 Idiopathic chronic gout, multiple sites, without tophus (tophi): Secondary | ICD-10-CM | POA: Diagnosis not present

## 2014-12-21 DIAGNOSIS — R7612 Nonspecific reaction to cell mediated immunity measurement of gamma interferon antigen response without active tuberculosis: Secondary | ICD-10-CM | POA: Diagnosis not present

## 2014-12-21 DIAGNOSIS — M0589 Other rheumatoid arthritis with rheumatoid factor of multiple sites: Secondary | ICD-10-CM | POA: Diagnosis not present

## 2014-12-22 DIAGNOSIS — M129 Arthropathy, unspecified: Secondary | ICD-10-CM | POA: Diagnosis not present

## 2014-12-23 DIAGNOSIS — M129 Arthropathy, unspecified: Secondary | ICD-10-CM | POA: Diagnosis not present

## 2014-12-24 DIAGNOSIS — M129 Arthropathy, unspecified: Secondary | ICD-10-CM | POA: Diagnosis not present

## 2014-12-25 DIAGNOSIS — M129 Arthropathy, unspecified: Secondary | ICD-10-CM | POA: Diagnosis not present

## 2014-12-26 DIAGNOSIS — M129 Arthropathy, unspecified: Secondary | ICD-10-CM | POA: Diagnosis not present

## 2014-12-27 DIAGNOSIS — M129 Arthropathy, unspecified: Secondary | ICD-10-CM | POA: Diagnosis not present

## 2014-12-28 DIAGNOSIS — M129 Arthropathy, unspecified: Secondary | ICD-10-CM | POA: Diagnosis not present

## 2014-12-29 DIAGNOSIS — M129 Arthropathy, unspecified: Secondary | ICD-10-CM | POA: Diagnosis not present

## 2014-12-30 DIAGNOSIS — M129 Arthropathy, unspecified: Secondary | ICD-10-CM | POA: Diagnosis not present

## 2014-12-31 ENCOUNTER — Other Ambulatory Visit: Payer: Self-pay | Admitting: Family Medicine

## 2014-12-31 DIAGNOSIS — M129 Arthropathy, unspecified: Secondary | ICD-10-CM | POA: Diagnosis not present

## 2015-01-01 DIAGNOSIS — M129 Arthropathy, unspecified: Secondary | ICD-10-CM | POA: Diagnosis not present

## 2015-01-02 DIAGNOSIS — M129 Arthropathy, unspecified: Secondary | ICD-10-CM | POA: Diagnosis not present

## 2015-01-03 DIAGNOSIS — N952 Postmenopausal atrophic vaginitis: Secondary | ICD-10-CM | POA: Diagnosis not present

## 2015-01-03 DIAGNOSIS — D495 Neoplasm of unspecified behavior of other genitourinary organs: Secondary | ICD-10-CM | POA: Diagnosis not present

## 2015-01-03 DIAGNOSIS — M129 Arthropathy, unspecified: Secondary | ICD-10-CM | POA: Diagnosis not present

## 2015-01-04 ENCOUNTER — Telehealth: Payer: Self-pay | Admitting: Family Medicine

## 2015-01-04 DIAGNOSIS — M129 Arthropathy, unspecified: Secondary | ICD-10-CM | POA: Diagnosis not present

## 2015-01-04 NOTE — Telephone Encounter (Signed)
Call in #120 with 5 rf 

## 2015-01-04 NOTE — Telephone Encounter (Signed)
Pt request refill of the following: traMADol (ULTRAM) 50 MG tablet   Phamacy: [facility]

## 2015-01-05 DIAGNOSIS — M129 Arthropathy, unspecified: Secondary | ICD-10-CM | POA: Diagnosis not present

## 2015-01-05 NOTE — Telephone Encounter (Signed)
done

## 2015-01-06 DIAGNOSIS — M129 Arthropathy, unspecified: Secondary | ICD-10-CM | POA: Diagnosis not present

## 2015-01-07 DIAGNOSIS — M129 Arthropathy, unspecified: Secondary | ICD-10-CM | POA: Diagnosis not present

## 2015-01-08 DIAGNOSIS — M129 Arthropathy, unspecified: Secondary | ICD-10-CM | POA: Diagnosis not present

## 2015-01-09 DIAGNOSIS — M129 Arthropathy, unspecified: Secondary | ICD-10-CM | POA: Diagnosis not present

## 2015-01-10 DIAGNOSIS — M129 Arthropathy, unspecified: Secondary | ICD-10-CM | POA: Diagnosis not present

## 2015-01-11 DIAGNOSIS — Z933 Colostomy status: Secondary | ICD-10-CM | POA: Diagnosis not present

## 2015-01-11 DIAGNOSIS — M129 Arthropathy, unspecified: Secondary | ICD-10-CM | POA: Diagnosis not present

## 2015-01-12 DIAGNOSIS — M129 Arthropathy, unspecified: Secondary | ICD-10-CM | POA: Diagnosis not present

## 2015-01-13 DIAGNOSIS — M129 Arthropathy, unspecified: Secondary | ICD-10-CM | POA: Diagnosis not present

## 2015-01-14 DIAGNOSIS — M129 Arthropathy, unspecified: Secondary | ICD-10-CM | POA: Diagnosis not present

## 2015-01-15 DIAGNOSIS — M129 Arthropathy, unspecified: Secondary | ICD-10-CM | POA: Diagnosis not present

## 2015-01-16 DIAGNOSIS — M129 Arthropathy, unspecified: Secondary | ICD-10-CM | POA: Diagnosis not present

## 2015-01-17 DIAGNOSIS — M129 Arthropathy, unspecified: Secondary | ICD-10-CM | POA: Diagnosis not present

## 2015-01-18 DIAGNOSIS — M129 Arthropathy, unspecified: Secondary | ICD-10-CM | POA: Diagnosis not present

## 2015-01-19 DIAGNOSIS — M129 Arthropathy, unspecified: Secondary | ICD-10-CM | POA: Diagnosis not present

## 2015-01-20 DIAGNOSIS — M129 Arthropathy, unspecified: Secondary | ICD-10-CM | POA: Diagnosis not present

## 2015-01-21 DIAGNOSIS — M129 Arthropathy, unspecified: Secondary | ICD-10-CM | POA: Diagnosis not present

## 2015-01-22 DIAGNOSIS — M129 Arthropathy, unspecified: Secondary | ICD-10-CM | POA: Diagnosis not present

## 2015-01-23 DIAGNOSIS — M129 Arthropathy, unspecified: Secondary | ICD-10-CM | POA: Diagnosis not present

## 2015-01-24 DIAGNOSIS — M129 Arthropathy, unspecified: Secondary | ICD-10-CM | POA: Diagnosis not present

## 2015-01-25 DIAGNOSIS — M129 Arthropathy, unspecified: Secondary | ICD-10-CM | POA: Diagnosis not present

## 2015-01-26 DIAGNOSIS — M129 Arthropathy, unspecified: Secondary | ICD-10-CM | POA: Diagnosis not present

## 2015-01-27 DIAGNOSIS — M129 Arthropathy, unspecified: Secondary | ICD-10-CM | POA: Diagnosis not present

## 2015-01-28 DIAGNOSIS — M129 Arthropathy, unspecified: Secondary | ICD-10-CM | POA: Diagnosis not present

## 2015-01-29 DIAGNOSIS — M129 Arthropathy, unspecified: Secondary | ICD-10-CM | POA: Diagnosis not present

## 2015-01-30 DIAGNOSIS — M129 Arthropathy, unspecified: Secondary | ICD-10-CM | POA: Diagnosis not present

## 2015-01-31 DIAGNOSIS — M129 Arthropathy, unspecified: Secondary | ICD-10-CM | POA: Diagnosis not present

## 2015-02-01 DIAGNOSIS — M129 Arthropathy, unspecified: Secondary | ICD-10-CM | POA: Diagnosis not present

## 2015-02-02 DIAGNOSIS — M129 Arthropathy, unspecified: Secondary | ICD-10-CM | POA: Diagnosis not present

## 2015-02-03 DIAGNOSIS — M129 Arthropathy, unspecified: Secondary | ICD-10-CM | POA: Diagnosis not present

## 2015-02-04 DIAGNOSIS — M129 Arthropathy, unspecified: Secondary | ICD-10-CM | POA: Diagnosis not present

## 2015-02-05 DIAGNOSIS — M129 Arthropathy, unspecified: Secondary | ICD-10-CM | POA: Diagnosis not present

## 2015-02-06 DIAGNOSIS — M129 Arthropathy, unspecified: Secondary | ICD-10-CM | POA: Diagnosis not present

## 2015-02-07 ENCOUNTER — Telehealth: Payer: Self-pay | Admitting: Family Medicine

## 2015-02-07 DIAGNOSIS — M129 Arthropathy, unspecified: Secondary | ICD-10-CM | POA: Diagnosis not present

## 2015-02-07 NOTE — Telephone Encounter (Signed)
Pt needs new rx hydrocodone for next 3 months separate rxs.

## 2015-02-08 DIAGNOSIS — M129 Arthropathy, unspecified: Secondary | ICD-10-CM | POA: Diagnosis not present

## 2015-02-08 DIAGNOSIS — Z933 Colostomy status: Secondary | ICD-10-CM | POA: Diagnosis not present

## 2015-02-09 DIAGNOSIS — M129 Arthropathy, unspecified: Secondary | ICD-10-CM | POA: Diagnosis not present

## 2015-02-09 DIAGNOSIS — Z933 Colostomy status: Secondary | ICD-10-CM | POA: Diagnosis not present

## 2015-02-09 MED ORDER — HYDROCODONE-ACETAMINOPHEN 10-325 MG PO TABS: 1.0000 | ORAL_TABLET | Freq: Four times a day (QID) | ORAL | Status: DC | PRN

## 2015-02-09 NOTE — Telephone Encounter (Signed)
done

## 2015-02-09 NOTE — Telephone Encounter (Signed)
Called and spoke with pt and pt is aware rx is ready for pick up.  

## 2015-02-10 ENCOUNTER — Telehealth: Payer: Self-pay

## 2015-02-10 DIAGNOSIS — M129 Arthropathy, unspecified: Secondary | ICD-10-CM | POA: Diagnosis not present

## 2015-02-10 NOTE — Telephone Encounter (Signed)
Left a message for call back.  Would like to discuss patient's current medication list.

## 2015-02-10 NOTE — Telephone Encounter (Signed)
Pt is calling [place] location and wondering why [place] location is calling about her medication. Pt will call ashlee back tomorrow

## 2015-02-10 NOTE — Telephone Encounter (Signed)
Pt returned called.  Was unable to answer at the time.  Called patient back and patient stated that she was not at a place you could talk.  Pt stated she would call back in 10 mins.  Awaiting call back.

## 2015-02-10 NOTE — Telephone Encounter (Signed)
Pt called stating she needs a call any time after 1:30pm tomorrow 02/11/15.

## 2015-02-11 DIAGNOSIS — M129 Arthropathy, unspecified: Secondary | ICD-10-CM | POA: Diagnosis not present

## 2015-02-12 DIAGNOSIS — M129 Arthropathy, unspecified: Secondary | ICD-10-CM | POA: Diagnosis not present

## 2015-02-13 DIAGNOSIS — M129 Arthropathy, unspecified: Secondary | ICD-10-CM | POA: Diagnosis not present

## 2015-02-14 DIAGNOSIS — M129 Arthropathy, unspecified: Secondary | ICD-10-CM | POA: Diagnosis not present

## 2015-02-14 NOTE — Telephone Encounter (Signed)
Med list reviewed.  Pt is not currently taking a beta blocker. She is, however, taking amlodipine as prescribed.

## 2015-02-14 NOTE — Telephone Encounter (Signed)
Left a message for call back.  

## 2015-02-15 DIAGNOSIS — M129 Arthropathy, unspecified: Secondary | ICD-10-CM | POA: Diagnosis not present

## 2015-02-16 DIAGNOSIS — M129 Arthropathy, unspecified: Secondary | ICD-10-CM | POA: Diagnosis not present

## 2015-02-17 DIAGNOSIS — M129 Arthropathy, unspecified: Secondary | ICD-10-CM | POA: Diagnosis not present

## 2015-02-18 DIAGNOSIS — M129 Arthropathy, unspecified: Secondary | ICD-10-CM | POA: Diagnosis not present

## 2015-02-19 DIAGNOSIS — M129 Arthropathy, unspecified: Secondary | ICD-10-CM | POA: Diagnosis not present

## 2015-02-20 DIAGNOSIS — M129 Arthropathy, unspecified: Secondary | ICD-10-CM | POA: Diagnosis not present

## 2015-02-21 DIAGNOSIS — M129 Arthropathy, unspecified: Secondary | ICD-10-CM | POA: Diagnosis not present

## 2015-02-22 DIAGNOSIS — M129 Arthropathy, unspecified: Secondary | ICD-10-CM | POA: Diagnosis not present

## 2015-02-23 DIAGNOSIS — M129 Arthropathy, unspecified: Secondary | ICD-10-CM | POA: Diagnosis not present

## 2015-02-24 DIAGNOSIS — M129 Arthropathy, unspecified: Secondary | ICD-10-CM | POA: Diagnosis not present

## 2015-02-25 ENCOUNTER — Other Ambulatory Visit: Payer: Self-pay | Admitting: Family Medicine

## 2015-02-25 DIAGNOSIS — M129 Arthropathy, unspecified: Secondary | ICD-10-CM | POA: Diagnosis not present

## 2015-02-26 DIAGNOSIS — M129 Arthropathy, unspecified: Secondary | ICD-10-CM | POA: Diagnosis not present

## 2015-02-27 DIAGNOSIS — M129 Arthropathy, unspecified: Secondary | ICD-10-CM | POA: Diagnosis not present

## 2015-02-28 DIAGNOSIS — M129 Arthropathy, unspecified: Secondary | ICD-10-CM | POA: Diagnosis not present

## 2015-03-01 DIAGNOSIS — M129 Arthropathy, unspecified: Secondary | ICD-10-CM | POA: Diagnosis not present

## 2015-03-02 DIAGNOSIS — M129 Arthropathy, unspecified: Secondary | ICD-10-CM | POA: Diagnosis not present

## 2015-03-03 DIAGNOSIS — M129 Arthropathy, unspecified: Secondary | ICD-10-CM | POA: Diagnosis not present

## 2015-03-04 DIAGNOSIS — M129 Arthropathy, unspecified: Secondary | ICD-10-CM | POA: Diagnosis not present

## 2015-03-05 DIAGNOSIS — M129 Arthropathy, unspecified: Secondary | ICD-10-CM | POA: Diagnosis not present

## 2015-03-06 DIAGNOSIS — M129 Arthropathy, unspecified: Secondary | ICD-10-CM | POA: Diagnosis not present

## 2015-03-07 DIAGNOSIS — M129 Arthropathy, unspecified: Secondary | ICD-10-CM | POA: Diagnosis not present

## 2015-03-08 DIAGNOSIS — M129 Arthropathy, unspecified: Secondary | ICD-10-CM | POA: Diagnosis not present

## 2015-03-09 DIAGNOSIS — M129 Arthropathy, unspecified: Secondary | ICD-10-CM | POA: Diagnosis not present

## 2015-03-10 DIAGNOSIS — M129 Arthropathy, unspecified: Secondary | ICD-10-CM | POA: Diagnosis not present

## 2015-03-11 DIAGNOSIS — M129 Arthropathy, unspecified: Secondary | ICD-10-CM | POA: Diagnosis not present

## 2015-03-12 DIAGNOSIS — M129 Arthropathy, unspecified: Secondary | ICD-10-CM | POA: Diagnosis not present

## 2015-03-13 DIAGNOSIS — M129 Arthropathy, unspecified: Secondary | ICD-10-CM | POA: Diagnosis not present

## 2015-03-14 DIAGNOSIS — M129 Arthropathy, unspecified: Secondary | ICD-10-CM | POA: Diagnosis not present

## 2015-03-14 DIAGNOSIS — M0589 Other rheumatoid arthritis with rheumatoid factor of multiple sites: Secondary | ICD-10-CM | POA: Diagnosis not present

## 2015-03-15 DIAGNOSIS — Z933 Colostomy status: Secondary | ICD-10-CM | POA: Diagnosis not present

## 2015-03-15 DIAGNOSIS — M129 Arthropathy, unspecified: Secondary | ICD-10-CM | POA: Diagnosis not present

## 2015-03-16 DIAGNOSIS — M129 Arthropathy, unspecified: Secondary | ICD-10-CM | POA: Diagnosis not present

## 2015-03-17 DIAGNOSIS — M129 Arthropathy, unspecified: Secondary | ICD-10-CM | POA: Diagnosis not present

## 2015-03-18 DIAGNOSIS — M129 Arthropathy, unspecified: Secondary | ICD-10-CM | POA: Diagnosis not present

## 2015-03-19 DIAGNOSIS — M129 Arthropathy, unspecified: Secondary | ICD-10-CM | POA: Diagnosis not present

## 2015-03-20 DIAGNOSIS — M129 Arthropathy, unspecified: Secondary | ICD-10-CM | POA: Diagnosis not present

## 2015-03-21 DIAGNOSIS — M129 Arthropathy, unspecified: Secondary | ICD-10-CM | POA: Diagnosis not present

## 2015-03-22 DIAGNOSIS — M129 Arthropathy, unspecified: Secondary | ICD-10-CM | POA: Diagnosis not present

## 2015-03-23 DIAGNOSIS — M129 Arthropathy, unspecified: Secondary | ICD-10-CM | POA: Diagnosis not present

## 2015-03-24 DIAGNOSIS — M129 Arthropathy, unspecified: Secondary | ICD-10-CM | POA: Diagnosis not present

## 2015-03-25 DIAGNOSIS — M129 Arthropathy, unspecified: Secondary | ICD-10-CM | POA: Diagnosis not present

## 2015-03-26 DIAGNOSIS — M129 Arthropathy, unspecified: Secondary | ICD-10-CM | POA: Diagnosis not present

## 2015-03-27 DIAGNOSIS — M129 Arthropathy, unspecified: Secondary | ICD-10-CM | POA: Diagnosis not present

## 2015-03-28 DIAGNOSIS — M129 Arthropathy, unspecified: Secondary | ICD-10-CM | POA: Diagnosis not present

## 2015-03-29 DIAGNOSIS — M129 Arthropathy, unspecified: Secondary | ICD-10-CM | POA: Diagnosis not present

## 2015-03-30 DIAGNOSIS — M129 Arthropathy, unspecified: Secondary | ICD-10-CM | POA: Diagnosis not present

## 2015-03-31 DIAGNOSIS — M129 Arthropathy, unspecified: Secondary | ICD-10-CM | POA: Diagnosis not present

## 2015-04-01 DIAGNOSIS — M129 Arthropathy, unspecified: Secondary | ICD-10-CM | POA: Diagnosis not present

## 2015-04-02 DIAGNOSIS — M129 Arthropathy, unspecified: Secondary | ICD-10-CM | POA: Diagnosis not present

## 2015-04-03 DIAGNOSIS — M129 Arthropathy, unspecified: Secondary | ICD-10-CM | POA: Diagnosis not present

## 2015-04-04 DIAGNOSIS — M129 Arthropathy, unspecified: Secondary | ICD-10-CM | POA: Diagnosis not present

## 2015-04-05 DIAGNOSIS — M129 Arthropathy, unspecified: Secondary | ICD-10-CM | POA: Diagnosis not present

## 2015-04-06 DIAGNOSIS — M129 Arthropathy, unspecified: Secondary | ICD-10-CM | POA: Diagnosis not present

## 2015-04-07 DIAGNOSIS — M129 Arthropathy, unspecified: Secondary | ICD-10-CM | POA: Diagnosis not present

## 2015-04-08 DIAGNOSIS — M129 Arthropathy, unspecified: Secondary | ICD-10-CM | POA: Diagnosis not present

## 2015-04-09 DIAGNOSIS — M129 Arthropathy, unspecified: Secondary | ICD-10-CM | POA: Diagnosis not present

## 2015-04-10 DIAGNOSIS — M129 Arthropathy, unspecified: Secondary | ICD-10-CM | POA: Diagnosis not present

## 2015-04-11 DIAGNOSIS — M129 Arthropathy, unspecified: Secondary | ICD-10-CM | POA: Diagnosis not present

## 2015-04-12 DIAGNOSIS — M129 Arthropathy, unspecified: Secondary | ICD-10-CM | POA: Diagnosis not present

## 2015-04-13 DIAGNOSIS — M129 Arthropathy, unspecified: Secondary | ICD-10-CM | POA: Diagnosis not present

## 2015-04-14 DIAGNOSIS — M129 Arthropathy, unspecified: Secondary | ICD-10-CM | POA: Diagnosis not present

## 2015-04-15 DIAGNOSIS — M129 Arthropathy, unspecified: Secondary | ICD-10-CM | POA: Diagnosis not present

## 2015-04-16 DIAGNOSIS — M129 Arthropathy, unspecified: Secondary | ICD-10-CM | POA: Diagnosis not present

## 2015-04-17 DIAGNOSIS — M129 Arthropathy, unspecified: Secondary | ICD-10-CM | POA: Diagnosis not present

## 2015-04-18 DIAGNOSIS — M2550 Pain in unspecified joint: Secondary | ICD-10-CM | POA: Diagnosis not present

## 2015-04-18 DIAGNOSIS — M0579 Rheumatoid arthritis with rheumatoid factor of multiple sites without organ or systems involvement: Secondary | ICD-10-CM | POA: Diagnosis not present

## 2015-04-18 DIAGNOSIS — R7612 Nonspecific reaction to cell mediated immunity measurement of gamma interferon antigen response without active tuberculosis: Secondary | ICD-10-CM | POA: Diagnosis not present

## 2015-04-18 DIAGNOSIS — M1A09X0 Idiopathic chronic gout, multiple sites, without tophus (tophi): Secondary | ICD-10-CM | POA: Diagnosis not present

## 2015-04-18 DIAGNOSIS — Z79899 Other long term (current) drug therapy: Secondary | ICD-10-CM | POA: Diagnosis not present

## 2015-04-18 DIAGNOSIS — M129 Arthropathy, unspecified: Secondary | ICD-10-CM | POA: Diagnosis not present

## 2015-04-19 DIAGNOSIS — M129 Arthropathy, unspecified: Secondary | ICD-10-CM | POA: Diagnosis not present

## 2015-04-20 DIAGNOSIS — M129 Arthropathy, unspecified: Secondary | ICD-10-CM | POA: Diagnosis not present

## 2015-04-21 DIAGNOSIS — M129 Arthropathy, unspecified: Secondary | ICD-10-CM | POA: Diagnosis not present

## 2015-04-22 DIAGNOSIS — M129 Arthropathy, unspecified: Secondary | ICD-10-CM | POA: Diagnosis not present

## 2015-04-23 DIAGNOSIS — M129 Arthropathy, unspecified: Secondary | ICD-10-CM | POA: Diagnosis not present

## 2015-04-24 DIAGNOSIS — M129 Arthropathy, unspecified: Secondary | ICD-10-CM | POA: Diagnosis not present

## 2015-04-25 DIAGNOSIS — M129 Arthropathy, unspecified: Secondary | ICD-10-CM | POA: Diagnosis not present

## 2015-04-29 ENCOUNTER — Other Ambulatory Visit: Payer: Self-pay | Admitting: Family Medicine

## 2015-05-02 ENCOUNTER — Telehealth: Payer: Self-pay | Admitting: Family Medicine

## 2015-05-02 DIAGNOSIS — M129 Arthropathy, unspecified: Secondary | ICD-10-CM | POA: Diagnosis not present

## 2015-05-02 NOTE — Telephone Encounter (Signed)
Pt has an appt on 11/14 for flu shot and would like to pick up hydrocodone rx for next 3 months.

## 2015-05-03 DIAGNOSIS — M129 Arthropathy, unspecified: Secondary | ICD-10-CM | POA: Diagnosis not present

## 2015-05-03 MED ORDER — HYDROCODONE-ACETAMINOPHEN 10-325 MG PO TABS: 1.0000 | ORAL_TABLET | Freq: Four times a day (QID) | ORAL | Status: DC | PRN

## 2015-05-03 NOTE — Telephone Encounter (Signed)
done

## 2015-05-03 NOTE — Telephone Encounter (Signed)
Script is ready for pick up and I left a voice message for pt. 

## 2015-05-04 DIAGNOSIS — M129 Arthropathy, unspecified: Secondary | ICD-10-CM | POA: Diagnosis not present

## 2015-05-05 DIAGNOSIS — M129 Arthropathy, unspecified: Secondary | ICD-10-CM | POA: Diagnosis not present

## 2015-05-06 DIAGNOSIS — M129 Arthropathy, unspecified: Secondary | ICD-10-CM | POA: Diagnosis not present

## 2015-05-07 DIAGNOSIS — M129 Arthropathy, unspecified: Secondary | ICD-10-CM | POA: Diagnosis not present

## 2015-05-08 DIAGNOSIS — M129 Arthropathy, unspecified: Secondary | ICD-10-CM | POA: Diagnosis not present

## 2015-05-09 ENCOUNTER — Ambulatory Visit (INDEPENDENT_AMBULATORY_CARE_PROVIDER_SITE_OTHER): Payer: Medicare Other | Admitting: Family Medicine

## 2015-05-09 DIAGNOSIS — Z23 Encounter for immunization: Secondary | ICD-10-CM | POA: Diagnosis not present

## 2015-05-09 DIAGNOSIS — M129 Arthropathy, unspecified: Secondary | ICD-10-CM | POA: Diagnosis not present

## 2015-05-10 DIAGNOSIS — Z933 Colostomy status: Secondary | ICD-10-CM | POA: Diagnosis not present

## 2015-05-10 DIAGNOSIS — M129 Arthropathy, unspecified: Secondary | ICD-10-CM | POA: Diagnosis not present

## 2015-05-11 DIAGNOSIS — M129 Arthropathy, unspecified: Secondary | ICD-10-CM | POA: Diagnosis not present

## 2015-05-12 DIAGNOSIS — M129 Arthropathy, unspecified: Secondary | ICD-10-CM | POA: Diagnosis not present

## 2015-05-13 DIAGNOSIS — M129 Arthropathy, unspecified: Secondary | ICD-10-CM | POA: Diagnosis not present

## 2015-05-14 DIAGNOSIS — M129 Arthropathy, unspecified: Secondary | ICD-10-CM | POA: Diagnosis not present

## 2015-05-15 DIAGNOSIS — M129 Arthropathy, unspecified: Secondary | ICD-10-CM | POA: Diagnosis not present

## 2015-06-06 ENCOUNTER — Encounter: Payer: Self-pay | Admitting: Family Medicine

## 2015-06-06 ENCOUNTER — Ambulatory Visit (INDEPENDENT_AMBULATORY_CARE_PROVIDER_SITE_OTHER): Payer: Medicare Other | Admitting: Family Medicine

## 2015-06-06 ENCOUNTER — Emergency Department (HOSPITAL_COMMUNITY): Payer: Medicare Other

## 2015-06-06 ENCOUNTER — Inpatient Hospital Stay (HOSPITAL_COMMUNITY)
Admission: EM | Admit: 2015-06-06 | Discharge: 2015-06-14 | DRG: 389 | Disposition: A | Discharge status: Home Health | Payer: Medicare Other | Attending: Internal Medicine | Admitting: Internal Medicine

## 2015-06-06 ENCOUNTER — Encounter (HOSPITAL_COMMUNITY): Payer: Self-pay

## 2015-06-06 VITALS — BP 118/82 | Temp 97.8°F

## 2015-06-06 DIAGNOSIS — F1021 Alcohol dependence, in remission: Secondary | ICD-10-CM | POA: Diagnosis present

## 2015-06-06 DIAGNOSIS — Z933 Colostomy status: Secondary | ICD-10-CM

## 2015-06-06 DIAGNOSIS — R350 Frequency of micturition: Secondary | ICD-10-CM | POA: Diagnosis not present

## 2015-06-06 DIAGNOSIS — Z7982 Long term (current) use of aspirin: Secondary | ICD-10-CM

## 2015-06-06 DIAGNOSIS — I5032 Chronic diastolic (congestive) heart failure: Secondary | ICD-10-CM | POA: Diagnosis present

## 2015-06-06 DIAGNOSIS — H548 Legal blindness, as defined in USA: Secondary | ICD-10-CM | POA: Diagnosis present

## 2015-06-06 DIAGNOSIS — I4892 Unspecified atrial flutter: Secondary | ICD-10-CM | POA: Diagnosis not present

## 2015-06-06 DIAGNOSIS — K439 Ventral hernia without obstruction or gangrene: Secondary | ICD-10-CM | POA: Diagnosis not present

## 2015-06-06 DIAGNOSIS — Z8673 Personal history of transient ischemic attack (TIA), and cerebral infarction without residual deficits: Secondary | ICD-10-CM | POA: Diagnosis not present

## 2015-06-06 DIAGNOSIS — K5660 Unspecified intestinal obstruction: Secondary | ICD-10-CM | POA: Diagnosis not present

## 2015-06-06 DIAGNOSIS — M419 Scoliosis, unspecified: Secondary | ICD-10-CM | POA: Diagnosis present

## 2015-06-06 DIAGNOSIS — E860 Dehydration: Secondary | ICD-10-CM | POA: Diagnosis present

## 2015-06-06 DIAGNOSIS — M1A9XX0 Chronic gout, unspecified, without tophus (tophi): Secondary | ICD-10-CM | POA: Diagnosis not present

## 2015-06-06 DIAGNOSIS — R3915 Urgency of urination: Secondary | ICD-10-CM | POA: Diagnosis not present

## 2015-06-06 DIAGNOSIS — K5669 Other intestinal obstruction: Secondary | ICD-10-CM | POA: Diagnosis not present

## 2015-06-06 DIAGNOSIS — Z79899 Other long term (current) drug therapy: Secondary | ICD-10-CM

## 2015-06-06 DIAGNOSIS — I10 Essential (primary) hypertension: Secondary | ICD-10-CM | POA: Diagnosis not present

## 2015-06-06 DIAGNOSIS — K7689 Other specified diseases of liver: Secondary | ICD-10-CM | POA: Diagnosis not present

## 2015-06-06 DIAGNOSIS — I5022 Chronic systolic (congestive) heart failure: Secondary | ICD-10-CM | POA: Diagnosis present

## 2015-06-06 DIAGNOSIS — K432 Incisional hernia without obstruction or gangrene: Secondary | ICD-10-CM | POA: Diagnosis present

## 2015-06-06 DIAGNOSIS — N2889 Other specified disorders of kidney and ureter: Secondary | ICD-10-CM | POA: Diagnosis present

## 2015-06-06 DIAGNOSIS — H540 Blindness, both eyes: Secondary | ICD-10-CM | POA: Diagnosis not present

## 2015-06-06 DIAGNOSIS — N289 Disorder of kidney and ureter, unspecified: Secondary | ICD-10-CM | POA: Diagnosis not present

## 2015-06-06 DIAGNOSIS — M1009 Idiopathic gout, multiple sites: Secondary | ICD-10-CM | POA: Diagnosis not present

## 2015-06-06 DIAGNOSIS — R300 Dysuria: Secondary | ICD-10-CM | POA: Diagnosis present

## 2015-06-06 DIAGNOSIS — I272 Other secondary pulmonary hypertension: Secondary | ICD-10-CM | POA: Diagnosis present

## 2015-06-06 DIAGNOSIS — R000 Tachycardia, unspecified: Secondary | ICD-10-CM | POA: Diagnosis present

## 2015-06-06 DIAGNOSIS — M069 Rheumatoid arthritis, unspecified: Secondary | ICD-10-CM | POA: Diagnosis present

## 2015-06-06 DIAGNOSIS — E876 Hypokalemia: Secondary | ICD-10-CM | POA: Diagnosis not present

## 2015-06-06 DIAGNOSIS — Z4682 Encounter for fitting and adjustment of non-vascular catheter: Secondary | ICD-10-CM | POA: Diagnosis not present

## 2015-06-06 DIAGNOSIS — Z87891 Personal history of nicotine dependence: Secondary | ICD-10-CM

## 2015-06-06 DIAGNOSIS — E46 Unspecified protein-calorie malnutrition: Secondary | ICD-10-CM | POA: Diagnosis present

## 2015-06-06 DIAGNOSIS — R109 Unspecified abdominal pain: Secondary | ICD-10-CM | POA: Diagnosis not present

## 2015-06-06 DIAGNOSIS — K56609 Unspecified intestinal obstruction, unspecified as to partial versus complete obstruction: Secondary | ICD-10-CM | POA: Diagnosis present

## 2015-06-06 DIAGNOSIS — K565 Intestinal adhesions [bands] with obstruction (postprocedural) (postinfection): Principal | ICD-10-CM | POA: Diagnosis present

## 2015-06-06 DIAGNOSIS — K4090 Unilateral inguinal hernia, without obstruction or gangrene, not specified as recurrent: Secondary | ICD-10-CM | POA: Diagnosis present

## 2015-06-06 DIAGNOSIS — Q8740 Marfan's syndrome, unspecified: Secondary | ICD-10-CM

## 2015-06-06 DIAGNOSIS — R112 Nausea with vomiting, unspecified: Secondary | ICD-10-CM | POA: Diagnosis present

## 2015-06-06 DIAGNOSIS — N390 Urinary tract infection, site not specified: Secondary | ICD-10-CM

## 2015-06-06 DIAGNOSIS — I5042 Chronic combined systolic (congestive) and diastolic (congestive) heart failure: Secondary | ICD-10-CM | POA: Diagnosis present

## 2015-06-06 DIAGNOSIS — K5732 Diverticulitis of large intestine without perforation or abscess without bleeding: Secondary | ICD-10-CM | POA: Diagnosis not present

## 2015-06-06 DIAGNOSIS — Z96653 Presence of artificial knee joint, bilateral: Secondary | ICD-10-CM | POA: Diagnosis present

## 2015-06-06 DIAGNOSIS — M109 Gout, unspecified: Secondary | ICD-10-CM | POA: Diagnosis present

## 2015-06-06 DIAGNOSIS — R1084 Generalized abdominal pain: Secondary | ICD-10-CM | POA: Diagnosis not present

## 2015-06-06 DIAGNOSIS — M810 Age-related osteoporosis without current pathological fracture: Secondary | ICD-10-CM | POA: Diagnosis present

## 2015-06-06 DIAGNOSIS — R229 Localized swelling, mass and lump, unspecified: Secondary | ICD-10-CM

## 2015-06-06 DIAGNOSIS — R739 Hyperglycemia, unspecified: Secondary | ICD-10-CM | POA: Diagnosis present

## 2015-06-06 DIAGNOSIS — R309 Painful micturition, unspecified: Secondary | ICD-10-CM | POA: Diagnosis not present

## 2015-06-06 DIAGNOSIS — D259 Leiomyoma of uterus, unspecified: Secondary | ICD-10-CM | POA: Diagnosis present

## 2015-06-06 DIAGNOSIS — H547 Unspecified visual loss: Secondary | ICD-10-CM | POA: Diagnosis present

## 2015-06-06 DIAGNOSIS — IMO0002 Reserved for concepts with insufficient information to code with codable children: Secondary | ICD-10-CM

## 2015-06-06 LAB — CBC
HCT: 39.1 % (ref 36.0–46.0)
Hemoglobin: 12.7 g/dL (ref 12.0–15.0)
MCH: 31.1 pg (ref 26.0–34.0)
MCHC: 32.5 g/dL (ref 30.0–36.0)
MCV: 95.6 fL (ref 78.0–100.0)
Platelets: 254 10*3/uL (ref 150–400)
RBC: 4.09 MIL/uL (ref 3.87–5.11)
RDW: 13.7 % (ref 11.5–15.5)
WBC: 9.4 10*3/uL (ref 4.0–10.5)

## 2015-06-06 LAB — COMPREHENSIVE METABOLIC PANEL
ALT: 16 U/L (ref 14–54)
AST: 24 U/L (ref 15–41)
Albumin: 4.3 g/dL (ref 3.5–5.0)
Alkaline Phosphatase: 95 U/L (ref 38–126)
Anion gap: 9 (ref 5–15)
BUN: 9 mg/dL (ref 6–20)
CO2: 29 mmol/L (ref 22–32)
Calcium: 10.2 mg/dL (ref 8.9–10.3)
Chloride: 103 mmol/L (ref 101–111)
Creatinine, Ser: 0.77 mg/dL (ref 0.44–1.00)
GFR calc Af Amer: >60 mL/min (ref >60)
GFR calc non Af Amer: >60 mL/min (ref >60)
Glucose, Bld: 112 mg/dL — ABNORMAL HIGH (ref 65–99)
Potassium: 5.1 mmol/L (ref 3.5–5.1)
Sodium: 141 mmol/L (ref 135–145)
Total Bilirubin: 0.7 mg/dL (ref 0.3–1.2)
Total Protein: 7.4 g/dL (ref 6.5–8.1)

## 2015-06-06 LAB — POCT URINALYSIS DIPSTICK
Bilirubin, UA: NEGATIVE
Blood, UA: NEGATIVE
Glucose, UA: NEGATIVE
Ketones, UA: NEGATIVE
Leukocytes, UA: NEGATIVE
Nitrite, UA: NEGATIVE
Spec Grav, UA: 1.025
Urobilinogen, UA: 0.2
pH, UA: 6

## 2015-06-06 LAB — LIPASE, BLOOD: Lipase: 40 U/L (ref 11–51)

## 2015-06-06 MED ORDER — IOHEXOL 300 MG/ML  SOLN
25.0000 mL | Freq: Once | INTRAMUSCULAR | Status: DC | PRN
  Administered 2015-06-06: 25 mL via ORAL
  Filled 2015-06-06: qty 30

## 2015-06-06 MED ORDER — ONDANSETRON HCL 4 MG/2ML IJ SOLN
4.0000 mg | Freq: Once | INTRAMUSCULAR | Status: AC
  Administered 2015-06-06: 4 mg via INTRAVENOUS
  Filled 2015-06-06: qty 2

## 2015-06-06 MED ORDER — NITROFURANTOIN MONOHYD MACRO 100 MG PO CAPS: 100.0000 mg | ORAL_CAPSULE | Freq: Two times a day (BID) | ORAL | Status: DC

## 2015-06-06 MED ORDER — MORPHINE SULFATE (PF) 4 MG/ML IV SOLN
4.0000 mg | Freq: Once | INTRAVENOUS | Status: AC
  Administered 2015-06-06: 4 mg via INTRAVENOUS
  Filled 2015-06-06: qty 1

## 2015-06-06 MED ORDER — SODIUM CHLORIDE 0.9 % IV BOLUS (SEPSIS)
1000.0000 mL | Freq: Once | INTRAVENOUS | Status: AC
Start: 2015-06-06 — End: 2015-06-06
  Administered 2015-06-06: 1000 mL via INTRAVENOUS

## 2015-06-06 MED ORDER — IOHEXOL 300 MG/ML  SOLN
100.0000 mL | Freq: Once | INTRAMUSCULAR | Status: AC | PRN
  Administered 2015-06-06: 100 mL via INTRAVENOUS

## 2015-06-06 MED ORDER — LIDOCAINE VISCOUS 2 % MT SOLN
15.0000 mL | Freq: Once | OROMUCOSAL | Status: DC
  Filled 2015-06-06: qty 15

## 2015-06-06 NOTE — Progress Notes (Signed)
   Subjective:    Patient ID: [name], female    DOB: [birth year], 59 y.o.   MRN: [id number]  HPI Here for 2 things. First for the past week she has ad urgency to urinate and burning on urination. No fever or nausea. Also the hernia at her ostomy site is getting much larger and has become painful to her. Dr. Johney Maine looked at this in June but felt it did not require intervention at that time.    Review of Systems  Constitutional: Negative.   Respiratory: Negative.   Cardiovascular: Negative.   Gastrointestinal: Positive for abdominal pain. Negative for nausea, vomiting, diarrhea, constipation, blood in stool, abdominal distention, anal bleeding and rectal pain.  Genitourinary: Positive for dysuria, urgency and frequency.       Objective:   Physical Exam  Constitutional: She appears well-developed and well-nourished. No distress.  Cardiovascular: Normal rate, regular rhythm, normal heart sounds and intact distal pulses.   Pulmonary/Chest: Effort normal and breath sounds normal.  Abdominal: Soft. Bowel sounds are normal. She exhibits no distension. There is no rebound and no guarding.  There is a large hernia at the ostomy site which is reducible but tender           Assessment & Plan:  Treat the UTI with Macrobid. We will have Dr. Johney Maine re-examine the hernia soon.

## 2015-06-06 NOTE — ED Notes (Signed)
Per [facility]: Pt complaining of abdominal pain. Pt went to the doctor for pain with urination, given prescription for antibiotics but was not able to get the prescription filled. Also complaining of nausea.  States pain is 8/10, abdomen distended. Pt does have ostomy bag, firm stool in bag. Pt does have a hernia.

## 2015-06-06 NOTE — ED Provider Notes (Signed)
CSN: [id number]     Arrival date & time 06/06/15  2019 History   First MD Initiated Contact with Patient 06/06/15 2032     Chief Complaint  Patient presents with  . Abdominal Pain     (Consider location/radiation/quality/duration/timing/severity/associated sxs/prior Treatment) HPI  Patient is a 59 year old female with past medical history significant for Marfan syndrome, rheumatoid arthritis, CVA, legally blind, diverticulitis s/p colectomy, h/o SBO, who presented to the emergency department with diffuse abdominal pain. Patient reports that her pain started at approximately 5:30 after eating dinner. Describes a dull, diffuse, nonradiating, constant abdominal pain. States it is worse when her "bowels move". Associated with nausea and vomiting. States last time her pain was like that she was having a bowel obstruction. Also reports dysuria that started 3 days ago with urinary urgency and frequency. Denies fevers, chills, chest pain, shortness of breath, back pain.  Past Medical History  Diagnosis Date  . Legally blind     since pt was a teenager  . Hearing aid worn     pt wears bilateral hearing aids  . Marfan's syndrome affecting skin     with scolosis  . Osteoporosis   . Hypertension   . 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 Hernia 4/08  . 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 Chronic gout 2008  . Stroke ([place]) 2/08  . Fibroid   . Status post bunionectomy 1/10    bilateral   . Diverticulitis of large intestine with perforation   . Small bowel obstruction due to adhesions ([place])   . Rheumatoid arthritis [facility])     sees Dr. Gavin Pound   . SBO (small bowel obstruction) ([place]) 01/05/2013  . Atrial flutter [facility])    Past Surgical History  Procedure Laterality Date  . Bunionectomy Bilateral 1/10  . Total knee arthroplasty Bilateral 6/08  . Hernia repair    . Laparotomy N/A 11/27/2012    Procedure: EXPLORATORY LAPAROTOMY SIGMOID COLECTOMY, COLOSTOMY;   Surgeon: Madilyn Hook, DO;  Location: [facility] ORS;  Service: General;  Laterality: N/A;  . Colostomy  11/27/2012  . Laparoscopic abdominal exploration N/A 01/06/2013    Procedure: LAPAROSCOPIC ABDOMINAL EXPLORATION;  Surgeon: Adin Hector, MD;  Location: [facility] ORS;  Service: General;  Laterality: N/A;  . Laparoscopic lysis of adhesions N/A 01/06/2013    Procedure: LAPAROSCOPIC LYSIS OF ADHESIONS/ INTEROTOMY REPAIR;  Surgeon: Adin Hector, MD;  Location: [facility] ORS;  Service: General;  Laterality: N/A;  . Colon surgery      colectomy  . Ablation  01/04/14    atrial flutter ablation (2 circuits) by Dr Lovena Le  . Atrial flutter ablation N/A 01/04/2014    Procedure: ATRIAL FLUTTER ABLATION;  Surgeon: Evans Lance, MD;  Location: [facility] CATH LAB;  Service: Cardiovascular;  Laterality: N/A;   Family History  Problem Relation Age of Onset  . Heart attack Neg Hx    Social History  Substance Use Topics  . Smoking status: Former Smoker -- 1.00 packs/day    Types: Cigarettes    Quit date: 11/27/2012  . Smokeless tobacco: Never Used  . Alcohol Use: No     Comment: recovering alcoholic sober since [id number]   OB History    Gravida Para Term Preterm AB TAB SAB Ectopic Multiple Living   1 1 1       1      Review of Systems  Constitutional: Negative for fever and appetite change.  HENT: Negative for congestion.   Respiratory: Negative for cough, chest tightness and shortness of breath.   Cardiovascular: Negative for chest pain.  Gastrointestinal: Positive for nausea, vomiting, abdominal pain and abdominal distention. Negative for diarrhea and blood in stool.  Genitourinary: Negative for dysuria, hematuria and flank pain.  Musculoskeletal: Negative for back pain.  Skin: Negative for rash.  Neurological: Negative for weakness.  Psychiatric/Behavioral: Negative for behavioral problems.      Allergies  Review of patient's allergies indicates no known allergies.  Home Medications   Prior to Admission  medications   Medication Sig Start Date End Date Taking? Authorizing Provider  Abatacept (ORENCIA) 125 MG/ML SOSY Inject 1 application into the skin once a week.   Yes Historical Provider, MD  albuterol (PROVENTIL HFA;VENTOLIN HFA) 108 (90 BASE) MCG/ACT inhaler Inhale 1 puff into the lungs every 4 (four) hours as needed for wheezing or shortness of breath. 12/29/13  Yes [name], MD  allopurinol (ZYLOPRIM) 100 MG tablet take 1 tablet by mouth once daily 02/25/15  Yes [name], MD  amLODipine (NORVASC) 10 MG tablet take 1 tablet by mouth once daily 02/25/15  Yes [name], MD  aspirin EC 81 MG tablet Take 1 tablet (81 mg total) by mouth daily. 05/11/14  Yes Verlee Monte, MD  Calcium Carbonate-Vitamin D (CALCIUM + D PO) Take 1,500 mg by mouth daily.    Yes Historical Provider, MD  cetirizine (ZYRTEC) 10 MG tablet Take 1 tablet (10 mg total) by mouth daily. 11/17/14  Yes [name], MD  Cyanocobalamin (VITAMIN B 12 PO) Take 1 tablet by mouth daily.   Yes Historical Provider, MD  docusate sodium (COLACE) 100 MG capsule Take 100 mg by mouth 2 (two) times daily.   Yes Historical Provider, MD  folic acid (FOLVITE) 1 MG tablet Take 1 mg by mouth daily.   Yes Historical Provider, MD  HYDROcodone-acetaminophen (NORCO) 10-325 MG tablet Take 1 tablet by mouth every 6 (six) hours as needed for moderate pain. 05/03/15  Yes [name], MD  ketoconazole (NIZORAL) 2 % cream Apply 1 application topically 2 (two) times daily. Patient taking differently: Apply 1 application topically 2 (two) times daily as needed for irritation.  08/10/13  Yes [name], MD  methotrexate 25 MG/ML injection Inject 25 mg into the skin once a week. Take on Mondays   Yes Historical Provider, MD  Multiple Vitamins-Minerals (MULTIVITAMIN PO) Take 1 tablet by mouth daily.    Yes Historical Provider, MD  NEXIUM 40 MG capsule take 1 capsule by mouth twice a day 08/31/14  Yes [name], MD  polyethylene glycol [facility] /  GLYCOLAX) packet Take 17 g by mouth daily. Patient taking differently: Take 17 g by mouth daily as needed for mild constipation.  05/11/14  Yes Verlee Monte, MD  risedronate (ACTONEL) 150 MG tablet Take 150 mg by mouth every 30 (thirty) days. with water on empty stomach, nothing by mouth or lie down for next 30 minutes.   Yes Historical Provider, MD  tiZANidine (ZANAFLEX) 4 MG tablet take 1 tablet by mouth every 6 hours if needed for muscle spasm Patient taking differently: take 1 tablet by mouth twice daily 04/29/15  Yes [name], MD  traMADol (ULTRAM) 50 MG tablet take 1-2 tablets by mouth every 6 hours if needed Patient taking differently: take 1-2 tablets by mouth twice daily. 01/05/15  Yes [name], MD  vitamin C (ASCORBIC ACID) 500 MG tablet  Take 500 mg by mouth daily.   Yes Historical Provider, MD  nitrofurantoin, macrocrystal-monohydrate, (MACROBID) 100 MG capsule Take 1 capsule (100 mg total) by mouth 2 (two) times daily. Patient not taking: Reported on 06/07/2015 06/06/15   [name], MD  promethazine (PHENERGAN) 25 MG tablet Take 1 tablet (25 mg total) by mouth every 6 (six) hours as needed for nausea or vomiting. Patient not taking: Reported on 12/20/2014 11/16/14   [name], MD   BP 123/90 mmHg  Pulse 114  Temp(Src) 98.7 F (37.1 C) (Oral)  Resp 10  Ht 5\' 8"  (1.727 m)  Wt 59.2 kg  BMI 19.85 kg/m2  SpO2 96%  LMP 06/25/2006 (LMP Unknown) Physical Exam  Constitutional: She is oriented to person, place, and time. She appears well-developed and well-nourished.  HENT:  Mouth/Throat: Oropharynx is clear and moist.  Eyes: Conjunctivae and EOM are normal. Pupils are equal, round, and reactive to light.  Neck: Normal range of motion. No JVD present.  Cardiovascular: Normal rate, regular rhythm, normal heart sounds and intact distal pulses.   Pulmonary/Chest: Effort normal and breath sounds normal. No respiratory distress.  Abdominal: There is no hepatosplenomegaly.  There is generalized tenderness. There is no rebound and no CVA tenderness.    Musculoskeletal: Normal range of motion.  Neurological: She is alert and oriented to person, place, and time.  Skin: Skin is warm. No pallor.  Psychiatric: She has a normal mood and affect.  Vitals reviewed.   ED Course  Procedures (including critical care time) Labs Review Labs Reviewed  COMPREHENSIVE METABOLIC PANEL - Abnormal; Notable for the following:    Glucose, Bld 112 (*)    All other components within normal limits  LIPASE, BLOOD  CBC  URINALYSIS, ROUTINE W REFLEX MICROSCOPIC (NOT AT [facility])  COMPREHENSIVE METABOLIC PANEL  CBC WITH DIFFERENTIAL/PLATELET    Imaging Review Ct Abdomen Pelvis W Contrast  06/06/2015  CLINICAL DATA:  Pain all over the abdomen. Nausea and vomiting. Colostomy feels plugged up. EXAM: CT ABDOMEN AND PELVIS WITH CONTRAST TECHNIQUE: Multidetector CT imaging of the abdomen and pelvis was performed using the standard protocol following bolus administration of intravenous contrast. CONTRAST:  129mL OMNIPAQUE IOHEXOL 300 MG/ML  SOLN COMPARISON:  11/22/2014 FINDINGS: Atelectasis in the lung bases.  Motion artifact limits evaluation. Multiple low-attenuation lesions in the liver likely representing cysts. Largest measures 2 cm diameter. No change since previous study. Gallbladder is contracted. No bile duct dilatation. Diffuse pancreatic atrophy with mild pancreatic ductal dilatation no change since prior study. The spleen, adrenal glands, inferior vena cava, and retroperitoneal lymph nodes are unremarkable. 18 mm mass in the posterior left kidney demonstrates increased density possibly representing hemorrhagic cyst although enhancing renal cell carcinoma is not excluded. Appearance is similar to previous study. Stomach is unremarkable. There is an anterior abdominal wall hernia containing several loops of small bowel. There is dilatation of fluid-filled small bowel with transition zone in  the left lower abdomen at the base of the hernia. Small bowel loops within the hernia appear dilated. This suggest small bowel obstruction, possibly due to adhesions or hernia. No significant bowel wall thickening. Left lower quadrant ostomy contains contrast material suggesting that this is not a complete obstruction although the relative decompression of the distal bowel suggests moderately high-grade obstruction. No free air or free fluid in the abdomen. Pelvis: Multiple decompressed small bowel loops are present. Bladder wall is not thickened but there is a small diverticulum in the posterior left bladder wall. No  intraluminal filling defects. Enhancing lesion in the uterus consistent with a fibroid. No adnexal masses. Small left inguinal hernia containing a small amount of fluid. This is unchanged since prior study. Small amount of free fluid in the pelvis. Prominent degenerative changes and scoliosis of the lumbar spine. Prominent degenerative changes in the hips. Old healed fracture deformity of the right inferior pubic ramus. IMPRESSION: Small bowel obstruction with transition zone in the mid abdomen possibly due to adhesion or possibly due to anterior abdominal wall hernia which contains dilated small bowel. Contrast material is present in the stoma suggesting that this is not a complete obstruction. However, decompression of distal loops suggest moderately high-grade obstruction is present. Small amount of free fluid in the pelvis. Small left inguinal hernia containing fluid. Indeterminate lesion in the left kidney again demonstrated. Uterine fibroid. Cyst in the liver. Electronically Signed   By: Lucienne Capers M.D.   On: 06/06/2015 23:27   I have personally reviewed and evaluated these images and lab results as part of my medical decision-making.   EKG Interpretation None      MDM   Final diagnoses:  None   Patient is a 59 year old female with past medical history significant for  diverticulitis s/p colectomy and h/o SBO, who presents to the emergency department with diffuse abdominal pain associated with nausea and vomiting. Arrival patient is in obvious distress, not ill appearing. Afebrile, hemodynamically stable. Exam notable for diffuse abdominal tenderness to palpation, colostomy bag without melena.  Patient's clinical picture concerning for SBO. CT abdomen obtained, showed small bowel obstruction with transition sound in the mid abdomen. Contrast material present in the stoma, suggesting that it is not a complete obstruction. Patient given IV pain medication, anti-emetics, IVF. NG tube placed. Patient made nothing by mouth. During the patient's previous SBO she was initially medically treated on the hospitalist service. Patient admitted to hospitalist. Patient stable to the floor    Nathaniel Man, MD 06/07/15 [id number]  Carmin Muskrat, MD 06/07/15 [phone]

## 2015-06-06 NOTE — ED Notes (Signed)
Patient transported to CT 

## 2015-06-06 NOTE — Progress Notes (Signed)
Pre visit review using our clinic review tool, if applicable. No additional management support is needed unless otherwise documented below in the visit note. 

## 2015-06-07 ENCOUNTER — Encounter (HOSPITAL_COMMUNITY): Payer: Self-pay | Admitting: Internal Medicine

## 2015-06-07 DIAGNOSIS — I10 Essential (primary) hypertension: Secondary | ICD-10-CM

## 2015-06-07 DIAGNOSIS — K5669 Other intestinal obstruction: Secondary | ICD-10-CM

## 2015-06-07 LAB — COMPREHENSIVE METABOLIC PANEL
ALT: 14 U/L (ref 14–54)
AST: 20 U/L (ref 15–41)
Albumin: 3.5 g/dL (ref 3.5–5.0)
Alkaline Phosphatase: 82 U/L (ref 38–126)
Anion gap: 9 (ref 5–15)
BUN: 7 mg/dL (ref 6–20)
CO2: 28 mmol/L (ref 22–32)
Calcium: 8.7 mg/dL — ABNORMAL LOW (ref 8.9–10.3)
Chloride: 103 mmol/L (ref 101–111)
Creatinine, Ser: 0.82 mg/dL (ref 0.44–1.00)
GFR calc Af Amer: >60 mL/min (ref >60)
GFR calc non Af Amer: >60 mL/min (ref >60)
Glucose, Bld: 154 mg/dL — ABNORMAL HIGH (ref 65–99)
Potassium: 3.9 mmol/L (ref 3.5–5.1)
Sodium: 140 mmol/L (ref 135–145)
Total Bilirubin: 0.8 mg/dL (ref 0.3–1.2)
Total Protein: 6.2 g/dL — ABNORMAL LOW (ref 6.5–8.1)

## 2015-06-07 LAB — GLUCOSE, CAPILLARY
Glucose-Capillary: 127 mg/dL — ABNORMAL HIGH (ref 65–99)
Glucose-Capillary: 136 mg/dL — ABNORMAL HIGH (ref 65–99)
Glucose-Capillary: 136 mg/dL — ABNORMAL HIGH (ref 65–99)
Glucose-Capillary: 125 mg/dL — ABNORMAL HIGH (ref 65–99)
Glucose-Capillary: 121 mg/dL — ABNORMAL HIGH (ref 65–99)

## 2015-06-07 LAB — CBC WITH DIFFERENTIAL/PLATELET
Basophils Absolute: 0 10*3/uL (ref 0.0–0.1)
Basophils Relative: 0 %
Eosinophils Absolute: 0.1 10*3/uL (ref 0.0–0.7)
Eosinophils Relative: 1 %
HCT: 37.4 % (ref 36.0–46.0)
Hemoglobin: 11.8 g/dL — ABNORMAL LOW (ref 12.0–15.0)
Lymphocytes Relative: 12 %
Lymphs Abs: 0.9 10*3/uL (ref 0.7–4.0)
MCH: 30.5 pg (ref 26.0–34.0)
MCHC: 31.6 g/dL (ref 30.0–36.0)
MCV: 96.6 fL (ref 78.0–100.0)
Monocytes Absolute: 0.4 10*3/uL (ref 0.1–1.0)
Monocytes Relative: 5 %
Neutro Abs: 6.1 10*3/uL (ref 1.7–7.7)
Neutrophils Relative %: 82 %
Platelets: 243 10*3/uL (ref 150–400)
RBC: 3.87 MIL/uL (ref 3.87–5.11)
RDW: 14.1 % (ref 11.5–15.5)
WBC: 7.4 10*3/uL (ref 4.0–10.5)

## 2015-06-07 LAB — PREALBUMIN: Prealbumin: 16.9 mg/dL — ABNORMAL LOW (ref 18–38)

## 2015-06-07 LAB — TSH: TSH: 0.856 u[IU]/mL (ref 0.350–4.500)

## 2015-06-07 MED ORDER — ONDANSETRON HCL 4 MG PO TABS
4.0000 mg | ORAL_TABLET | Freq: Four times a day (QID) | ORAL | Status: DC | PRN
  Administered 2015-06-10: 4 mg via ORAL
  Filled 2015-06-07: qty 1

## 2015-06-07 MED ORDER — ALBUTEROL SULFATE (2.5 MG/3ML) 0.083% IN NEBU: 3.0000 mL | INHALATION_SOLUTION | RESPIRATORY_TRACT | Status: DC | PRN

## 2015-06-07 MED ORDER — FENTANYL CITRATE (PF) 100 MCG/2ML IJ SOLN
25.0000 ug | INTRAMUSCULAR | Status: DC | PRN
  Administered 2015-06-07 – 2015-06-08 (×6): 25 ug via INTRAVENOUS
  Filled 2015-06-07 (×7): qty 2

## 2015-06-07 MED ORDER — MORPHINE SULFATE (PF) 4 MG/ML IV SOLN
4.0000 mg | INTRAVENOUS | Status: DC | PRN
  Administered 2015-06-08 – 2015-06-13 (×7): 4 mg via INTRAVENOUS
  Filled 2015-06-07 (×7): qty 1

## 2015-06-07 MED ORDER — HYDRALAZINE HCL 20 MG/ML IJ SOLN: 10.0000 mg | INTRAMUSCULAR | Status: DC | PRN

## 2015-06-07 MED ORDER — DEXTROSE-NACL 5-0.9 % IV SOLN
INTRAVENOUS | Status: DC
  Administered 2015-06-07: 75 mL/h via INTRAVENOUS

## 2015-06-07 MED ORDER — ACETAMINOPHEN 325 MG PO TABS
650.0000 mg | ORAL_TABLET | Freq: Four times a day (QID) | ORAL | Status: DC | PRN
  Administered 2015-06-14: 650 mg via ORAL
  Filled 2015-06-07: qty 2

## 2015-06-07 MED ORDER — ONDANSETRON HCL 4 MG/2ML IJ SOLN
4.0000 mg | INTRAMUSCULAR | Status: DC | PRN
  Filled 2015-06-07: qty 2

## 2015-06-07 MED ORDER — ONDANSETRON HCL 4 MG/2ML IJ SOLN
4.0000 mg | Freq: Four times a day (QID) | INTRAMUSCULAR | Status: DC | PRN
  Administered 2015-06-07 (×3): 4 mg via INTRAVENOUS
  Filled 2015-06-07 (×2): qty 2

## 2015-06-07 MED ORDER — ACETAMINOPHEN 650 MG RE SUPP: 650.0000 mg | Freq: Four times a day (QID) | RECTAL | Status: DC | PRN

## 2015-06-07 MED ORDER — PROMETHAZINE HCL 25 MG/ML IJ SOLN
12.5000 mg | Freq: Four times a day (QID) | INTRAMUSCULAR | Status: DC | PRN
  Administered 2015-06-07 (×2): 12.5 mg via INTRAVENOUS
  Filled 2015-06-07 (×2): qty 1

## 2015-06-07 NOTE — Progress Notes (Signed)
Pt vomiting again, but refusing NG unless IR places it.  I think she is at high risk for aspiration and have placed order for IR to place NG tube.

## 2015-06-07 NOTE — Progress Notes (Signed)
Subjective: No real change, she does not have an NG, she was just vomiting earlier.  Dr. Barry Dienes recommended IR make an attempt.  She has a film ordered for tomorrow.  She has old stool in the ostomy  Objective: Vital signs in last 24 hours: Temp:  [97.8 F (36.6 C)-98.7 F (37.1 C)] 98.6 F (37 C) (12/13 0747) Pulse Rate:  [109-122] 117 (12/13 0700) Resp:  [10-24] 13 (12/13 0600) BP: (118-160)/(82-105) 131/83 mmHg (12/13 0400) SpO2:  [80 %-100 %] 97 % (12/13 0700) Weight:  [59.2 kg (130 lb 8.2 oz)] 59.2 kg (130 lb 8.2 oz) (12/13 0100) Last BM Date: 06/07/15 Admitted early AM,  900 recorded from NG so far Afebrile, Tachycardic Labs OK CTscan 06/06/15:  Small bowel obstruction with transition zone in the mid abdomen possibly due to adhesion or possibly due to anterior abdominal wall hernia which contains dilated small bowel. Contrast material is present in the stoma suggesting that this is not a complete obstruction. However, decompression of distal loops suggest moderately high-grade obstruction is present. Small amount of free fluid in the pelvis. Small left inguinal hernia containing fluid. Indeterminate lesion in the left kidney again demonstrated. Uterine fibroid. Cyst in the liver.   Intake/Output from previous day: 12/12 0701 - 12/13 0700 In: 375 [I.V.:375] Out: 900 [Emesis/NG output:900] Intake/Output this shift:    General appearance: alert, cooperative and no distress GI: large ventral hernia, with Small bowel distended and easily visible.  No BS.  Old stool in ostomy bag.  Lab Results:   Recent Labs  06/06/15 2051 06/07/15 0609  WBC 9.4 7.4  HGB 12.7 11.8*  HCT 39.1 37.4  PLT 254 243    BMET  Recent Labs  06/06/15 2051 06/07/15 0609  NA 141 140  K 5.1 3.9  CL 103 103  CO2 29 28  GLUCOSE 112* 154*  BUN 9 7  CREATININE 0.77 0.82  CALCIUM 10.2 8.7*   PT/INR No results for input(s): LABPROT, INR in the last 72 hours.   Recent Labs Lab  06/06/15 2051 06/07/15 0609  AST 24 20  ALT 16 14  ALKPHOS 95 82  BILITOT 0.7 0.8  PROT 7.4 6.2*  ALBUMIN 4.3 3.5     Lipase     Component Value Date/Time   LIPASE 40 06/06/2015 2051     Studies/Results: Ct Abdomen Pelvis W Contrast  06/06/2015  CLINICAL DATA:  Pain all over the abdomen. Nausea and vomiting. Colostomy feels plugged up. EXAM: CT ABDOMEN AND PELVIS WITH CONTRAST TECHNIQUE: Multidetector CT imaging of the abdomen and pelvis was performed using the standard protocol following bolus administration of intravenous contrast. CONTRAST:  118mL OMNIPAQUE IOHEXOL 300 MG/ML  SOLN COMPARISON:  11/22/2014 FINDINGS: Atelectasis in the lung bases.  Motion artifact limits evaluation. Multiple low-attenuation lesions in the liver likely representing cysts. Largest measures 2 cm diameter. No change since previous study. Gallbladder is contracted. No bile duct dilatation. Diffuse pancreatic atrophy with mild pancreatic ductal dilatation no change since prior study. The spleen, adrenal glands, inferior vena cava, and retroperitoneal lymph nodes are unremarkable. 18 mm mass in the posterior left kidney demonstrates increased density possibly representing hemorrhagic cyst although enhancing renal cell carcinoma is not excluded. Appearance is similar to previous study. Stomach is unremarkable. There is an anterior abdominal wall hernia containing several loops of small bowel. There is dilatation of fluid-filled small bowel with transition zone in the left lower abdomen at the base of the hernia. Small bowel loops within the hernia  appear dilated. This suggest small bowel obstruction, possibly due to adhesions or hernia. No significant bowel wall thickening. Left lower quadrant ostomy contains contrast material suggesting that this is not a complete obstruction although the relative decompression of the distal bowel suggests moderately high-grade obstruction. No free air or free fluid in the abdomen.  Pelvis: Multiple decompressed small bowel loops are present. Bladder wall is not thickened but there is a small diverticulum in the posterior left bladder wall. No intraluminal filling defects. Enhancing lesion in the uterus consistent with a fibroid. No adnexal masses. Small left inguinal hernia containing a small amount of fluid. This is unchanged since prior study. Small amount of free fluid in the pelvis. Prominent degenerative changes and scoliosis of the lumbar spine. Prominent degenerative changes in the hips. Old healed fracture deformity of the right inferior pubic ramus. IMPRESSION: Small bowel obstruction with transition zone in the mid abdomen possibly due to adhesion or possibly due to anterior abdominal wall hernia which contains dilated small bowel. Contrast material is present in the stoma suggesting that this is not a complete obstruction. However, decompression of distal loops suggest moderately high-grade obstruction is present. Small amount of free fluid in the pelvis. Small left inguinal hernia containing fluid. Indeterminate lesion in the left kidney again demonstrated. Uterine fibroid. Cyst in the liver. Electronically Signed   By: Lucienne Capers M.D.   On: 06/06/2015 23:27    Medications: . lidocaine  15 mL Mouth/Throat Once     Assessment/Plan Recurrent SBO, or PSBO  S/p exploratory laparotomy, sigmoid colectomy/colostomy 11/27/12, Lysis of adhesions/enterotomy rpeair 01/06/13, Dr. 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  Bilateral knee replacements Hypertension Gout Atrial flutter  DVT:  SCD/she can have heparin or Lovenox for DVT prophylaxis from our standpoint Antibiotics:  None    Plan:  Pt refusing NG earlier.  I told her if she vomits again she will  need it.  I would keep her hydrated and I will check a prealbumin on her I'm sure malnourishment should be on her problem list.  Replace ostomy bag so we can see any changes.  Film in AM.  If we are not placing NG today she can have DVT dose heparin or Lovenox.   New film in AM.  LOS: 1 day    Azul Coffie 06/07/2015

## 2015-06-07 NOTE — Progress Notes (Signed)
Pt seen and examined at bedside. Please see earlier admission note by D.r Hal Hope as pt was admitted after midnight.    [facility] PROGRESS NOTE  [name] [id number] DOB: Jun 07, 1956 DOA: 06/06/2015 PCP: [name], MD   Brief narrative:    59 y.o. female with history of hypertension, chronic systolic heart failure, atrial flutter status post ablation, history of diverting colostomy for diverticulitis presented to [facility] ED with main concern of 1-2 days duration of progressively worsening epigastric pain, initially intermittent in nature and constant for the past 8 hours PTA, 10/10 in severity and associated with N/V, poor oral intake.    In the ER, CT abdomen and pelvis showed small bowel obstruction with transition point. [name] was admitted by [facility] team and surgery team consulted. Please note that patient has been refusing NGT.  Assessment/Plan:    Principal Problem:   SBO (small bowel obstruction) (HCC) - keep NPO, if vomiting continues to get worse, will reassess with pt if she would be willing to have NGT placed - provide analgesia and antiemetics as needed - appreciate surgery team following     Essential HTN - since NPO, home meds are on hold - agree with Dr. Hal Hope to have hydralazine as needed IV for now     Chronic systolic CHF - last 2 D ECHO in 2015 with EF 20% - continuing gentle hydration for now and close monitoring of volume status   Active Problems:   Gouty arthropathy - stable for now    Blindness  DVT prophylaxis - SCD's  Code Status: Full.  Family Communication:  plan of care discussed with the patient, pt declined my offer to call her family for an update  Disposition Plan: Not ready for d/c, still with vomiting, surgery on board   IV access:  Peripheral IV  Procedures and diagnostic studies:    Ct Abdomen Pelvis W Contrast 06/06/2015  Small bowel obstruction with transition zone in the mid abdomen possibly due to adhesion or  possibly due to anterior abdominal wall hernia which contains dilated small bowel. Contrast material is present in the stoma suggesting that this is not a complete obstruction. However, decompression of distal loops suggest moderately high-grade obstruction is present. Small amount of free fluid in the pelvis. Small left inguinal hernia containing fluid. Indeterminate lesion in the left kidney again demonstrated. Uterine fibroid. Cyst in the liver.  Medical Consultants:  Surgery   Other Consultants:  None  IAnti-Infectives:   None  Faye Ramsay, MD  [facility] Pager [phone]  If 7PM-7AM, please contact night-coverage [email or web address] Password [facility] 06/07/2015, 5:19 PM   LOS: 1 day   HPI/Subjective: No events overnight.   Objective: Filed Vitals:   06/07/15 1248 06/07/15 1250 06/07/15 1340 06/07/15 1434  BP:  123/84 112/83   Pulse:  117 118   Temp: 99 F (37.2 C)   98.7 F (37.1 C)  TempSrc: Oral   Oral  Resp:  11 16   Height:      Weight:      SpO2:  96% 98%     Intake/Output Summary (Last 24 hours) at 06/07/15 1719 Last data filed at 06/07/15 1500  Gross per 24 hour  Intake    375 ml  Output   1800 ml  Net  -1425 ml    Exam:   General:  Pt is alert, not in acute distress  Cardiovascular: Regular rhythm, tachycardic, S1/S2, no murmurs, no rubs, no gallops  Respiratory: Clear to auscultation bilaterally, no  wheezing, diminished breath sounds at bases   Abdomen: Soft, non tender, slightly distended, no guarding  Extremities: pulses DP and PT palpable bilaterally   Data Reviewed: Basic Metabolic Panel:  Recent Labs Lab 06/06/15 2051 06/07/15 0609  NA 141 140  K 5.1 3.9  CL 103 103  CO2 29 28  GLUCOSE 112* 154*  BUN 9 7  CREATININE 0.77 0.82  CALCIUM 10.2 8.7*   Liver Function Tests:  Recent Labs Lab 06/06/15 2051 06/07/15 0609  AST 24 20  ALT 16 14  ALKPHOS 95 82  BILITOT 0.7 0.8  PROT 7.4 6.2*  ALBUMIN 4.3 3.5    Recent Labs Lab  06/06/15 2051  LIPASE 40   CBC:  Recent Labs Lab 06/06/15 2051 06/07/15 0609  WBC 9.4 7.4  NEUTROABS  --  6.1  HGB 12.7 11.8*  HCT 39.1 37.4  MCV 95.6 96.6  PLT 254 243   CBG:  Recent Labs Lab 06/07/15 0336 06/07/15 0749 06/07/15 1251  GLUCAP 127* 136* 136*

## 2015-06-07 NOTE — Care Management Note (Addendum)
Case Management Note  Patient Details  Name: [name] MRN: [id number] Date of Birth: [birth year]  Subjective/Objective:                 Admitted with ABD pain/SBO. Hx of hypertension, chronic systolic heart failure, atrial flutter status post ablation, colostomy for diverticulitis. In the ER CT abdomen and pelvis shows small bowel obstruction with transition point. Pt states lives alone, blind and receives PCS from [facility] daily. DME: rolling walker.  Action/Plan:  Return to home when medically stable. CM tof/u with disposition needs. Expected Discharge Date:                  Expected Discharge Plan:  Home/Self Care  In-House Referral:     Discharge planning Services  CM Consult  Post Acute Care Choice:    Choice offered to:     DME Arranged:    DME Agency:     HH Arranged:    HH Agency:     Status of Service:  In process, will continue to follow  Medicare Important Message Given:    Date Medicare IM Given:    Medicare IM give by:    Date Additional Medicare IM Given:    Additional Medicare Important Message give by:     If discussed at [place] of Stay Meetings, dates discussed:    Additional Comments: CM spoke with pt /daughter regarding d/c plan. Pt states plan is to return to home with assistance from daughter Roslyn Smiling) if needed.    Austina Moceri (Daughter) [phone]  [place], [place] [phone] 06/07/2015, 10:39 AM

## 2015-06-07 NOTE — Progress Notes (Signed)
Patient still refusing to have NG tube placed at bedside, she states, "she will have it placed only if it is in IR". PA notified. Will continue to monitor.

## 2015-06-07 NOTE — Consult Note (Signed)
Reason for Consult:partial SBO Referring Physician:  Aneira Cavitt is an 59 y.o. female.  HPI:  Pt is a 59 yo F who presents with 24 hours of nausea and vomiting.  Pain was worst near her left sided colostomy.  She has had prior Hartmann's procedure and subsequent laparoscopic lysis of adhesions in 2014.  She had a bowel obstruction last year that was able to be managed non operatively.  Patient is legally blind and has Marfan's dx, scoliosis, and RA.  She has also had CVA, hypertension, and chronic systolic heart failure.  She has required ablation for atrial flutter.    NGT was unable to be placed.  She has had a large episode of emesis and now feels better.  She has continued to have a small amount of stool/contrast in ostomy, but minimal gas, and continued symptoms.    Past Medical History  Diagnosis Date  . Legally blind     since pt was a teenager  . Hearing aid worn     pt wears bilateral hearing aids  . Marfan's syndrome affecting skin     with scolosis  . Osteoporosis   . Hypertension   . Substance abuse     recovering alcoholic H67 years   . Hernia 4/08  . 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 Chronic gout 2008  . Stroke ([place]) 2/08  . Fibroid   . Status post bunionectomy 1/10    bilateral   . Diverticulitis of large intestine with perforation   . Small bowel obstruction due to adhesions ([place])   . Rheumatoid arthritis [facility])     sees Dr. Gavin Pound   . SBO (small bowel obstruction) ([place]) 01/05/2013  . Atrial flutter [facility])     Past Surgical History  Procedure Laterality Date  . Bunionectomy Bilateral 1/10  . Total knee arthroplasty Bilateral 6/08  . Hernia repair    . Laparotomy N/A 11/27/2012    Procedure: EXPLORATORY LAPAROTOMY SIGMOID COLECTOMY, COLOSTOMY;  Surgeon: Madilyn Hook, DO;  Location: [facility] ORS;  Service: General;  Laterality: N/A;  . Colostomy  11/27/2012  . Laparoscopic abdominal exploration N/A 01/06/2013    Procedure:  LAPAROSCOPIC ABDOMINAL EXPLORATION;  Surgeon: Adin Hector, MD;  Location: [facility]S;  Service: General;  Laterality: N/A;  . Laparoscopic lysis of adhesions N/A 01/06/2013    Procedure: LAPAROSCOPIC LYSIS OF ADHESIONS/ INTEROTOMY REPAIR;  Surgeon: Adin Hector, MD;  Location: [facility];  Service: General;  Laterality: N/A;  . Colon surgery      colectomy  . Ablation  01/04/14    atrial flutter ablation (2 circuits) by Dr Lovena Le  . Atrial flutter ablation N/A 01/04/2014    Procedure: ATRIAL FLUTTER ABLATION;  Surgeon: Evans Lance, MD;  Location: [facility] CATH LAB;  Service: Cardiovascular;  Laterality: N/A;    Family History  Problem Relation Age of Onset  . Heart attack Neg Hx     Social History:  reports that she quit smoking about 2 years ago. Her smoking use included Cigarettes. She smoked 1.00 pack per day. She has never used smokeless tobacco. She reports that she does not drink alcohol or use illicit drugs.  Allergies: No Known Allergies  Medications:  Prior to Admission:  Prescriptions prior to admission  Medication Sig Dispense Refill Last Dose  . Abatacept (ORENCIA) 125 MG/ML SOSY Inject 1 application into the skin once a week.   06/06/2015 at Unknown time  . albuterol (PROVENTIL  HFA;VENTOLIN HFA) 108 (90 BASE) MCG/ACT inhaler Inhale 1 puff into the lungs every 4 (four) hours as needed for wheezing or shortness of breath. 1 Inhaler 11 COUPLE MONTHS  . allopurinol (ZYLOPRIM) 100 MG tablet take 1 tablet by mouth once daily 30 tablet 6 06/06/2015 at Unknown time  . amLODipine (NORVASC) 10 MG tablet take 1 tablet by mouth once daily 30 tablet 6 06/06/2015 at Unknown time  . aspirin EC 81 MG tablet Take 1 tablet (81 mg total) by mouth daily.   06/06/2015 at Unknown time  . Calcium Carbonate-Vitamin D (CALCIUM + D PO) Take 1,500 mg by mouth daily.    06/06/2015 at Unknown time  . cetirizine (ZYRTEC) 10 MG tablet Take 1 tablet (10 mg total) by mouth daily. 90 tablet 1 06/06/2015 at Unknown  time  . Cyanocobalamin (VITAMIN B 12 PO) Take 1 tablet by mouth daily.   06/06/2015 at Unknown time  . docusate sodium (COLACE) 100 MG capsule Take 100 mg by mouth 2 (two) times daily.   06/06/2015 at Unknown time  . folic acid (FOLVITE) 1 MG tablet Take 1 mg by mouth daily.   06/06/2015 at Unknown time  . HYDROcodone-acetaminophen (NORCO) 10-325 MG tablet Take 1 tablet by mouth every 6 (six) hours as needed for moderate pain. 120 tablet 0 06/04/2015  . ketoconazole (NIZORAL) 2 % cream Apply 1 application topically 2 (two) times daily. (Patient taking differently: Apply 1 application topically 2 (two) times daily as needed for irritation. ) 30 g 5 couple months  . methotrexate 25 MG/ML injection Inject 25 mg into the skin once a week. Take on Mondays   06/06/2015  . Multiple Vitamins-Minerals (MULTIVITAMIN PO) Take 1 tablet by mouth daily.    06/06/2015 at Unknown time  . NEXIUM 40 MG capsule take 1 capsule by mouth twice a day 60 capsule 11 06/06/2015 at am  . polyethylene glycol (MIRALAX / GLYCOLAX) packet Take 17 g by mouth daily. (Patient taking differently: Take 17 g by mouth daily as needed for mild constipation. ) 14 each 0 Past Month at Unknown time  . risedronate (ACTONEL) 150 MG tablet Take 150 mg by mouth every 30 (thirty) days. with water on empty stomach, nothing by mouth or lie down for next 30 minutes.   last month  . tiZANidine (ZANAFLEX) 4 MG tablet take 1 tablet by mouth every 6 hours if needed for muscle spasm (Patient taking differently: take 1 tablet by mouth twice daily) 90 tablet 1 06/06/2015 at Unknown time  . traMADol (ULTRAM) 50 MG tablet take 1-2 tablets by mouth every 6 hours if needed (Patient taking differently: take 1-2 tablets by mouth twice daily.) 120 tablet 5 06/06/2015 at Unknown time  . vitamin C (ASCORBIC ACID) 500 MG tablet Take 500 mg by mouth daily.   06/06/2015 at Unknown time  . nitrofurantoin, macrocrystal-monohydrate, (MACROBID) 100 MG capsule Take 1 capsule  (100 mg total) by mouth 2 (two) times daily. (Patient not taking: Reported on 06/07/2015) 14 capsule 0   . promethazine (PHENERGAN) 25 MG tablet Take 1 tablet (25 mg total) by mouth every 6 (six) hours as needed for nausea or vomiting. (Patient not taking: Reported on 12/20/2014) 60 tablet 2 Not Taking    Results for orders placed or performed during the hospital encounter of 06/06/15 (from the past 48 hour(s))  Lipase, blood     Status: None   Collection Time: 06/06/15  8:51 PM  Result Value Ref Range   Lipase 40  11 - 51 U/L  Comprehensive metabolic panel     Status: Abnormal   Collection Time: 06/06/15  8:51 PM  Result Value Ref Range   Sodium 141 135 - 145 mmol/L   Potassium 5.1 3.5 - 5.1 mmol/L   Chloride 103 101 - 111 mmol/L   CO2 29 22 - 32 mmol/L   Glucose, Bld 112 (H) 65 - 99 mg/dL   BUN 9 6 - 20 mg/dL   Creatinine, Ser 0.77 0.44 - 1.00 mg/dL   Calcium 10.2 8.9 - 10.3 mg/dL   Total Protein 7.4 6.5 - 8.1 g/dL   Albumin 4.3 3.5 - 5.0 g/dL   AST 24 15 - 41 U/L   ALT 16 14 - 54 U/L   Alkaline Phosphatase 95 38 - 126 U/L   Total Bilirubin 0.7 0.3 - 1.2 mg/dL   GFR calc non Af Amer >60 >60 mL/min   GFR calc Af Amer >60 >60 mL/min    Comment: (NOTE) The eGFR has been calculated using the CKD EPI equation. This calculation has not been validated in all clinical situations. eGFR's persistently <60 mL/min signify possible Chronic Kidney Disease.    Anion gap 9 5 - 15  CBC     Status: None   Collection Time: 06/06/15  8:51 PM  Result Value Ref Range   WBC 9.4 4.0 - 10.5 K/uL   RBC 4.09 3.87 - 5.11 MIL/uL   Hemoglobin 12.7 12.0 - 15.0 g/dL   HCT 39.1 36.0 - 46.0 %   MCV 95.6 78.0 - 100.0 fL   MCH 31.1 26.0 - 34.0 pg   MCHC 32.5 30.0 - 36.0 g/dL   RDW 13.7 11.5 - 15.5 %   Platelets 254 150 - 400 K/uL  Glucose, capillary     Status: Abnormal   Collection Time: 06/07/15  3:36 AM  Result Value Ref Range   Glucose-Capillary 127 (H) 65 - 99 mg/dL    Ct Abdomen Pelvis W  Contrast  06/06/2015  CLINICAL DATA:  Pain all over the abdomen. Nausea and vomiting. Colostomy feels plugged up. EXAM: CT ABDOMEN AND PELVIS WITH CONTRAST TECHNIQUE: Multidetector CT imaging of the abdomen and pelvis was performed using the standard protocol following bolus administration of intravenous contrast. CONTRAST:  110m OMNIPAQUE IOHEXOL 300 MG/ML  SOLN COMPARISON:  11/22/2014 FINDINGS: Atelectasis in the lung bases.  Motion artifact limits evaluation. Multiple low-attenuation lesions in the liver likely representing cysts. Largest measures 2 cm diameter. No change since previous study. Gallbladder is contracted. No bile duct dilatation. Diffuse pancreatic atrophy with mild pancreatic ductal dilatation no change since prior study. The spleen, adrenal glands, inferior vena cava, and retroperitoneal lymph nodes are unremarkable. 18 mm mass in the posterior left kidney demonstrates increased density possibly representing hemorrhagic cyst although enhancing renal cell carcinoma is not excluded. Appearance is similar to previous study. Stomach is unremarkable. There is an anterior abdominal wall hernia containing several loops of small bowel. There is dilatation of fluid-filled small bowel with transition zone in the left lower abdomen at the base of the hernia. Small bowel loops within the hernia appear dilated. This suggest small bowel obstruction, possibly due to adhesions or hernia. No significant bowel wall thickening. Left lower quadrant ostomy contains contrast material suggesting that this is not a complete obstruction although the relative decompression of the distal bowel suggests moderately high-grade obstruction. No free air or free fluid in the abdomen. Pelvis: Multiple decompressed small bowel loops are present. Bladder wall is not thickened  but there is a small diverticulum in the posterior left bladder wall. No intraluminal filling defects. Enhancing lesion in the uterus consistent with a  fibroid. No adnexal masses. Small left inguinal hernia containing a small amount of fluid. This is unchanged since prior study. Small amount of free fluid in the pelvis. Prominent degenerative changes and scoliosis of the lumbar spine. Prominent degenerative changes in the hips. Old healed fracture deformity of the right inferior pubic ramus. IMPRESSION: Small bowel obstruction with transition zone in the mid abdomen possibly due to adhesion or possibly due to anterior abdominal wall hernia which contains dilated small bowel. Contrast material is present in the stoma suggesting that this is not a complete obstruction. However, decompression of distal loops suggest moderately high-grade obstruction is present. Small amount of free fluid in the pelvis. Small left inguinal hernia containing fluid. Indeterminate lesion in the left kidney again demonstrated. Uterine fibroid. Cyst in the liver. Electronically Signed   By: Lucienne Capers M.D.   On: 06/06/2015 23:27    Review of Systems  Unable to perform ROS: patient unresponsive  Pt had been given pain meds, and was sleeping deeply   Blood pressure 131/83, pulse 116, temperature 98.1 F (36.7 C), temperature source Axillary, resp. rate 14, height '5\' 8"'  (1.727 m), weight 59.2 kg (130 lb 8.2 oz), last menstrual period 06/25/2006, SpO2 95 %. Physical Exam  Constitutional: She appears cachectic. She is sleeping.  HENT:  Head: Normocephalic and atraumatic.  Neck: No JVD present.  Cardiovascular: Normal rate, regular rhythm and intact distal pulses.   Respiratory: No accessory muscle usage. No respiratory distress.  GI: Soft. Bowel sounds are decreased. A hernia is present. Hernia confirmed positive in the ventral area.  Slightly distended.  Pasty stool in left sided end colostomy bag  Musculoskeletal:       Right shoulder: She exhibits deformity (numerous deformities of hands secondary to RA).  Neurological:  Sleeping comfortably.  Did not easily awaken.   Skin: Skin is warm and dry.    Assessment/Plan: Partial small bowel obstruction likely secondary to adhesions. Ventral incisional hernia, reducible S/p colostomy RA/marfan's on immunosuppression (orencia) and methotrexate  NPO Agree with making attempts to get NGT in fluoroscopy Hopefully symptoms would resolve with conservative management. If not, will need lysis of adhesions.   Repeat films tomorrow.   Check magnesium and make sure adequate.   Rosebud Koenen 06/07/2015, 6:06 AM

## 2015-06-07 NOTE — Progress Notes (Signed)
Pt vomited 600cc of green bilious emesis. While vomiting pt HR ST 150's, came back to ST 110's. Md notified. Will continue to monitor.

## 2015-06-07 NOTE — Consult Note (Signed)
W[facility] ostomy consult note Consult requested for ostomy supplies.  Discussed patient via phone with the bedside nurse. Pt had ostomy surgery in 2014 and has a family member assist with pouching activities since she is blind; they are currently not in the room. Ostomy pouching: Ordered 2 1/4 inch 2 piece pouching system to keep at the bedside if leakage occurs, then the bedside nurse can change PRN if leakage occurs. Please re-consult if further assistance is needed.  Thank-you,  Julien Girt MSN, [place], [place], [place], [place]

## 2015-06-07 NOTE — Progress Notes (Signed)
Pt has not voided today, bladder scan demonstrated 160-241ml of urine. MD notified. No new orders given. Will continue to monitor.

## 2015-06-07 NOTE — ED Notes (Signed)
Pt states, "I can't have the NG put in here, they always have to do it with the x-ray, it never works, it always gets stuck." - Dr. Vanita Panda notified.

## 2015-06-07 NOTE — Progress Notes (Signed)
UR COMPLETED  

## 2015-06-07 NOTE — H&P (Signed)
[facility] History and Physical  [name] [id number] DOB: 29-Sep-1955 DOA: 06/06/2015  Referring physician: Dr. Gwen Pounds. PCP: [name], MD  Specialists: Dr. Lyman Bishop. Cardiologist.  Chief Complaint: Abdominal pain.  HPI: [name] is a 59 y.o. female with history of hypertension, chronic systolic heart failure, atrial flutter status post ablation, history of diverting colostomy for diverticulitis presents to the ER because of abdominal pain and nausea vomiting. Patient's symptoms started yesterday afternoon. Pain was more of the colostomy site became more generalized with nausea vomiting. In the ER CT abdomen and pelvis shows small bowel obstruction with transition point. Patient had similar episodes before and last one was in June of this year and was managed conservatively. Patient will be admitted for further management of small bowel obstruction. Patient is found to be tachycardic initially the rate of 130s. Monitor showing sinus tachycardia. Patient has a history of atrial flutter status post ablation.  Review of Systems: As presented in the history of presenting illness, rest negative.  Past Medical History  Diagnosis Date  . Legally blind     since pt was a teenager  . Hearing aid worn     pt wears bilateral hearing aids  . Marfan's syndrome affecting skin     with scolosis  . Osteoporosis   . Hypertension   . 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 Hernia 4/08  . 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 Chronic gout 2008  . Stroke ([place]) 2/08  . Fibroid   . Status post bunionectomy 1/10    bilateral   . Diverticulitis of large intestine with perforation   . Small bowel obstruction due to adhesions ([place])   . Rheumatoid arthritis [facility])     sees Dr. Gavin Pound   . SBO (small bowel obstruction) ([place]) 01/05/2013  . Atrial flutter [facility])    Past Surgical History  Procedure Laterality Date  . Bunionectomy Bilateral  1/10  . Total knee arthroplasty Bilateral 6/08  . Hernia repair    . Laparotomy N/A 11/27/2012    Procedure: EXPLORATORY LAPAROTOMY SIGMOID COLECTOMY, COLOSTOMY;  Surgeon: Madilyn Hook, DO;  Location: [facility] ORS;  Service: General;  Laterality: N/A;  . Colostomy  11/27/2012  . Laparoscopic abdominal exploration N/A 01/06/2013    Procedure: LAPAROSCOPIC ABDOMINAL EXPLORATION;  Surgeon: Adin Hector, MD;  Location: [facility] ORS;  Service: General;  Laterality: N/A;  . Laparoscopic lysis of adhesions N/A 01/06/2013    Procedure: LAPAROSCOPIC LYSIS OF ADHESIONS/ INTEROTOMY REPAIR;  Surgeon: Adin Hector, MD;  Location: [facility] ORS;  Service: General;  Laterality: N/A;  . Colon surgery      colectomy  . Ablation  01/04/14    atrial flutter ablation (2 circuits) by Dr Lovena Le  . Atrial flutter ablation N/A 01/04/2014    Procedure: ATRIAL FLUTTER ABLATION;  Surgeon: Evans Lance, MD;  Location: [facility] CATH LAB;  Service: Cardiovascular;  Laterality: N/A;   Social History:  reports that she quit smoking about 2 years ago. Her smoking use included Cigarettes. She smoked 1.00 pack per day. She has never used smokeless tobacco. She reports that she does not drink alcohol or use illicit drugs. Where does patient live home. Can patient participate in ADLs? Not sure.  No Known Allergies  Family History:  Family History  Problem Relation Age of Onset  . Heart attack Neg Hx       Prior to Admission medications  Medication Sig Start Date End Date Taking? Authorizing Provider  Abatacept (ORENCIA) 125 MG/ML SOSY Inject 1 application into the skin once a week.   Yes Historical Provider, MD  albuterol (PROVENTIL HFA;VENTOLIN HFA) 108 (90 BASE) MCG/ACT inhaler Inhale 1 puff into the lungs every 4 (four) hours as needed for wheezing or shortness of breath. 12/29/13  Yes [name], MD  allopurinol (ZYLOPRIM) 100 MG tablet take 1 tablet by mouth once daily 02/25/15  Yes [name], MD  amLODipine (NORVASC) 10 MG tablet  take 1 tablet by mouth once daily 02/25/15  Yes [name], MD  aspirin EC 81 MG tablet Take 1 tablet (81 mg total) by mouth daily. 05/11/14  Yes Verlee Monte, MD  Calcium Carbonate-Vitamin D (CALCIUM + D PO) Take 1,500 mg by mouth daily.    Yes Historical Provider, MD  cetirizine (ZYRTEC) 10 MG tablet Take 1 tablet (10 mg total) by mouth daily. 11/17/14  Yes [name], MD  Cyanocobalamin (VITAMIN B 12 PO) Take 1 tablet by mouth daily.   Yes Historical Provider, MD  docusate sodium (COLACE) 100 MG capsule Take 100 mg by mouth 2 (two) times daily.   Yes Historical Provider, MD  folic acid (FOLVITE) 1 MG tablet Take 1 mg by mouth daily.   Yes Historical Provider, MD  HYDROcodone-acetaminophen (NORCO) 10-325 MG tablet Take 1 tablet by mouth every 6 (six) hours as needed for moderate pain. 05/03/15  Yes [name], MD  ketoconazole (NIZORAL) 2 % cream Apply 1 application topically 2 (two) times daily. Patient taking differently: Apply 1 application topically 2 (two) times daily as needed for irritation.  08/10/13  Yes [name], MD  methotrexate 25 MG/ML injection Inject 25 mg into the skin once a week. Take on Mondays   Yes Historical Provider, MD  Multiple Vitamins-Minerals (MULTIVITAMIN PO) Take 1 tablet by mouth daily.    Yes Historical Provider, MD  NEXIUM 40 MG capsule take 1 capsule by mouth twice a day 08/31/14  Yes [name], MD  polyethylene glycol [facility] / GLYCOLAX) packet Take 17 g by mouth daily. Patient taking differently: Take 17 g by mouth daily as needed for mild constipation.  05/11/14  Yes Verlee Monte, MD  risedronate (ACTONEL) 150 MG tablet Take 150 mg by mouth every 30 (thirty) days. with water on empty stomach, nothing by mouth or lie down for next 30 minutes.   Yes Historical Provider, MD  tiZANidine (ZANAFLEX) 4 MG tablet take 1 tablet by mouth every 6 hours if needed for muscle spasm Patient taking differently: take 1 tablet by mouth twice daily 04/29/15  Yes  [name], MD  traMADol (ULTRAM) 50 MG tablet take 1-2 tablets by mouth every 6 hours if needed Patient taking differently: take 1-2 tablets by mouth twice daily. 01/05/15  Yes [name], MD  vitamin C (ASCORBIC ACID) 500 MG tablet Take 500 mg by mouth daily.   Yes Historical Provider, MD  nitrofurantoin, macrocrystal-monohydrate, (MACROBID) 100 MG capsule Take 1 capsule (100 mg total) by mouth 2 (two) times daily. Patient not taking: Reported on 06/07/2015 06/06/15   [name], MD  promethazine (PHENERGAN) 25 MG tablet Take 1 tablet (25 mg total) by mouth every 6 (six) hours as needed for nausea or vomiting. Patient not taking: Reported on 12/20/2014 11/16/14   [name], MD    Physical Exam: Filed Vitals:   06/07/15 0000 06/07/15 0015 06/07/15 0030 06/07/15 0100  BP: 160/90 152/90 155/92 123/90  Pulse: 112 113 116 115  Temp:    98.7 F (37.1 C)  TempSrc:    Oral  Resp: 21 17 15 12   Height:    5\' 8"  (1.727 m)  Weight:    59.2 kg (130 lb 8.2 oz)  SpO2: 96% 95% 97% 96%     General:  Moderately built and poorly nourished.  Eyes: Patient is blind in both eyes.  ENT: No discharge from the ears eyes nose or mouth.  Neck: No mass felt.  Cardiovascular: S1-S2 heard tachycardic.  Respiratory: No rhonchi or crepitations.  Abdomen: Soft nontender bowel sounds present. Colostomy bag seen.  Skin: No rash.  Musculoskeletal: No edema.  Psychiatric: Appears normal.  Neurologic: Alert awake oriented to time place and person. Moves all extremities.  Labs on Admission:  Basic Metabolic Panel:  Recent Labs Lab 06/06/15 2051  NA 141  K 5.1  CL 103  CO2 29  GLUCOSE 112*  BUN 9  CREATININE 0.77  CALCIUM 10.2   Liver Function Tests:  Recent Labs Lab 06/06/15 2051  AST 24  ALT 16  ALKPHOS 95  BILITOT 0.7  PROT 7.4  ALBUMIN 4.3    Recent Labs Lab 06/06/15 2051  LIPASE 40   No results for input(s): AMMONIA in the last 168 hours. CBC:  Recent  Labs Lab 06/06/15 2051  WBC 9.4  HGB 12.7  HCT 39.1  MCV 95.6  PLT 254   Cardiac Enzymes: No results for input(s): CKTOTAL, CKMB, CKMBINDEX, TROPONINI in the last 168 hours.  BNP (last 3 results) No results for input(s): BNP in the last 8760 hours.  ProBNP (last 3 results) No results for input(s): PROBNP in the last 8760 hours.  CBG: No results for input(s): GLUCAP in the last 168 hours.  Radiological Exams on Admission: Ct Abdomen Pelvis W Contrast  06/06/2015  CLINICAL DATA:  Pain all over the abdomen. Nausea and vomiting. Colostomy feels plugged up. EXAM: CT ABDOMEN AND PELVIS WITH CONTRAST TECHNIQUE: Multidetector CT imaging of the abdomen and pelvis was performed using the standard protocol following bolus administration of intravenous contrast. CONTRAST:  196mL OMNIPAQUE IOHEXOL 300 MG/ML  SOLN COMPARISON:  11/22/2014 FINDINGS: Atelectasis in the lung bases.  Motion artifact limits evaluation. Multiple low-attenuation lesions in the liver likely representing cysts. Largest measures 2 cm diameter. No change since previous study. Gallbladder is contracted. No bile duct dilatation. Diffuse pancreatic atrophy with mild pancreatic ductal dilatation no change since prior study. The spleen, adrenal glands, inferior vena cava, and retroperitoneal lymph nodes are unremarkable. 18 mm mass in the posterior left kidney demonstrates increased density possibly representing hemorrhagic cyst although enhancing renal cell carcinoma is not excluded. Appearance is similar to previous study. Stomach is unremarkable. There is an anterior abdominal wall hernia containing several loops of small bowel. There is dilatation of fluid-filled small bowel with transition zone in the left lower abdomen at the base of the hernia. Small bowel loops within the hernia appear dilated. This suggest small bowel obstruction, possibly due to adhesions or hernia. No significant bowel wall thickening. Left lower quadrant ostomy  contains contrast material suggesting that this is not a complete obstruction although the relative decompression of the distal bowel suggests moderately high-grade obstruction. No free air or free fluid in the abdomen. Pelvis: Multiple decompressed small bowel loops are present. Bladder wall is not thickened but there is a small diverticulum in the posterior left bladder wall. No intraluminal filling defects. Enhancing lesion  in the uterus consistent with a fibroid. No adnexal masses. Small left inguinal hernia containing a small amount of fluid. This is unchanged since prior study. Small amount of free fluid in the pelvis. Prominent degenerative changes and scoliosis of the lumbar spine. Prominent degenerative changes in the hips. Old healed fracture deformity of the right inferior pubic ramus. IMPRESSION: Small bowel obstruction with transition zone in the mid abdomen possibly due to adhesion or possibly due to anterior abdominal wall hernia which contains dilated small bowel. Contrast material is present in the stoma suggesting that this is not a complete obstruction. However, decompression of distal loops suggest moderately high-grade obstruction is present. Small amount of free fluid in the pelvis. Small left inguinal hernia containing fluid. Indeterminate lesion in the left kidney again demonstrated. Uterine fibroid. Cyst in the liver. Electronically Signed   By: Lucienne Capers M.D.   On: 06/06/2015 23:27     Assessment/Plan Principal Problem:   SBO (small bowel obstruction) (HCC) Active Problems:   Gouty arthropathy   Blindness   Essential hypertension   Rheumatoid arthritis ([place])   Pulmonary HTN ([place])   1. Small bowel obstruction - CAT scan does show a transition point. I have discussed with on-call surgeon Dr. Barry Dienes will be seeing patient in consult. Patient will be kept nothing by mouth and gentle hydration for now. Continue with pain relieving medications. Further recommendations per  surgery. Unable to place NG tube and will need fluoroscopic guided NG tube placement in a.m. 2. Hypertension - since patient is nothing by mouth I have placed patient on when necessary IV hydralazine. 3. Sinus tachycardia - EKG is pending. Patient does have a history of atrial flutter status post ablation. Patient's rate was initially in the 130s improved after pain control. Closely observe. Check TSH urine drug screen. 4. History of systolic heart failure last evening measured in [id number] was [id number] - systolic heart failure was probably secondary to tachycardia mediated. At this time we are gently hydrating closely observe respiratory status. 5. History of diverting colostomy for diverticulitis. 6. History of gout and rheumatoid arthritis with deformed joints - patient is presently nothing by mouth. 7. Patient is blind. 8. History of Marfan syndrome.  EKG is pending. I have reviewed patient's old charts and labs. I will discuss with on-call general surgeon Dr. Barry Dienes.   DVT Prophylaxis SCDs in anticipation of surgery.  Code Status: Full code.  Family Communication: Discussed with patient.  Disposition Plan: Admit to inpatient.    Anamarie Hunn N. [facility] Pager [phone].  If 7PM-7AM, please contact night-coverage [email or web address] Password [email or web address] 06/07/2015, 1:53 AM

## 2015-06-08 ENCOUNTER — Inpatient Hospital Stay (HOSPITAL_COMMUNITY): Payer: Medicare Other

## 2015-06-08 LAB — BASIC METABOLIC PANEL
Anion gap: 7 (ref 5–15)
BUN: 10 mg/dL (ref 6–20)
CO2: 29 mmol/L (ref 22–32)
Calcium: 8.5 mg/dL — ABNORMAL LOW (ref 8.9–10.3)
Chloride: 107 mmol/L (ref 101–111)
Creatinine, Ser: 0.77 mg/dL (ref 0.44–1.00)
GFR calc Af Amer: >60 mL/min (ref >60)
GFR calc non Af Amer: >60 mL/min (ref >60)
Glucose, Bld: 133 mg/dL — ABNORMAL HIGH (ref 65–99)
Potassium: 3.2 mmol/L — ABNORMAL LOW (ref 3.5–5.1)
Sodium: 143 mmol/L (ref 135–145)

## 2015-06-08 LAB — CBC
HCT: 37.2 % (ref 36.0–46.0)
Hemoglobin: 11.7 g/dL — ABNORMAL LOW (ref 12.0–15.0)
MCH: 30.6 pg (ref 26.0–34.0)
MCHC: 31.5 g/dL (ref 30.0–36.0)
MCV: 97.4 fL (ref 78.0–100.0)
Platelets: 226 10*3/uL (ref 150–400)
RBC: 3.82 MIL/uL — ABNORMAL LOW (ref 3.87–5.11)
RDW: 14.2 % (ref 11.5–15.5)
WBC: 4.2 10*3/uL (ref 4.0–10.5)

## 2015-06-08 LAB — MAGNESIUM: Magnesium: 1.7 mg/dL (ref 1.7–2.4)

## 2015-06-08 LAB — GLUCOSE, CAPILLARY
Glucose-Capillary: 130 mg/dL — ABNORMAL HIGH (ref 65–99)
Glucose-Capillary: 134 mg/dL — ABNORMAL HIGH (ref 65–99)
Glucose-Capillary: 109 mg/dL — ABNORMAL HIGH (ref 65–99)
Glucose-Capillary: 116 mg/dL — ABNORMAL HIGH (ref 65–99)
Glucose-Capillary: 93 mg/dL (ref 65–99)
Glucose-Capillary: 87 mg/dL (ref 65–99)

## 2015-06-08 MED ORDER — POTASSIUM CHLORIDE 10 MEQ/100ML IV SOLN
10.0000 meq | INTRAVENOUS | Status: AC
  Administered 2015-06-08 (×4): 10 meq via INTRAVENOUS
  Filled 2015-06-08 (×2): qty 100

## 2015-06-08 MED ORDER — LIDOCAINE VISCOUS 2 % MT SOLN
OROMUCOSAL | Status: AC
  Filled 2015-06-08: qty 15

## 2015-06-08 MED ORDER — POTASSIUM CHLORIDE IN NACL 40-0.9 MEQ/L-% IV SOLN
INTRAVENOUS | Status: DC
  Administered 2015-06-08 – 2015-06-11 (×2): 75 mL/h via INTRAVENOUS
  Filled 2015-06-08 (×12): qty 1000

## 2015-06-08 MED ORDER — POTASSIUM CHLORIDE 10 MEQ/100ML IV SOLN: 10.0000 meq | INTRAVENOUS | Status: DC

## 2015-06-08 MED ORDER — SODIUM CHLORIDE 0.9 % IV SOLN
INTRAVENOUS | Status: DC
  Administered 2015-06-08: 04:00:00 via INTRAVENOUS

## 2015-06-08 MED ORDER — ALBUTEROL SULFATE (2.5 MG/3ML) 0.083% IN NEBU: 3.0000 mL | INHALATION_SOLUTION | RESPIRATORY_TRACT | Status: DC | PRN

## 2015-06-08 MED ORDER — WHITE PETROLATUM GEL
Status: AC
  Administered 2015-06-08: 0.2
  Filled 2015-06-08: qty 1

## 2015-06-08 NOTE — Progress Notes (Signed)
Physical Therapy Treatment Patient Details Name: [name] MRN: [id number] DOB: 23-Feb-1956 Today's Date: 06/08/2015    History of Present Illness pt is 59 y/o female with h/o blindness, Marfan's sydrome, osteoporosis, HTN, substance abuse, bil TKA's, stroke, RA, admitted with abdominal pain due to SBO    PT Comments    Pt admitted with/for abdominal pain due to SBO.  Pt currently limited functionally due to the problems listed below.  (see problems list.)  Pt will benefit from PT to maximize function and safety to be able to get home safely with available assist.   Follow Up Recommendations  Home health PT;Supervision - Intermittent     Equipment Recommendations  None recommended by PT    Recommendations for Other Services       Precautions / Restrictions Precautions Precautions: Fall    Mobility  Bed Mobility Overal bed mobility: Needs Assistance Bed Mobility: Supine to Sit;Sit to Supine     Supine to sit: Min assist Sit to supine: Min guard   General bed mobility comments: pt moved slowly, but needed no HOB raised or rail.  Needed help with repositioning  Transfers Overall transfer level: Needs assistance Equipment used: Rolling walker (2 wheeled) Transfers: Sit to/from Stand Sit to Stand: Min guard         General transfer comment: struggled from lower surface, but no assist needed.  Ambulation/Gait Ambulation/Gait assistance: Min assist Ambulation Distance (Feet): 60 Feet Assistive device: Rolling walker (2 wheeled) Gait Pattern/deviations: Step-through pattern;Decreased step length - right;Decreased step length - left;Decreased stride length Gait velocity: slow Gait velocity interpretation: Below normal speed for age/gender General Gait Details: generally unsteady with tendency to list R on occasion.  Needed assist stabilizing the RW and guiding due to blindness.   Stairs            Wheelchair Mobility    Modified Rankin (Stroke  Patients Only)       Balance Overall balance assessment: Needs assistance Sitting-balance support: No upper extremity supported Sitting balance-Leahy Scale: Fair     Standing balance support: Bilateral upper extremity supported Standing balance-Leahy Scale: Poor Standing balance comment: reliant on the RW                    Cognition Arousal/Alertness: Awake/alert Behavior During Therapy: Flat affect;WFL for tasks assessed/performed Overall Cognitive Status: Within Functional Limits for tasks assessed                      Exercises      General Comments General comments (skin integrity, edema, etc.): VSS      Pertinent Vitals/Pain Pain Assessment: Faces Faces Pain Scale: Hurts little more Pain Location: l hand/arm Pain Descriptors / Indicators: Grimacing Pain Intervention(s): Limited activity within patient's tolerance    Home Living Family/patient expects to be discharged to:: Private residence Living Arrangements: Alone Available Help at Discharge: Personal care attendant;Family;Available PRN/intermittently Type of Home: Apartment Home Access: Level entry   Home Layout: One level Home Equipment: Walker - 2 wheels;Bedside commode;Shower seat;Wheelchair - manual;Toilet riser Additional Comments: Pt lived alone with assitance from her daughter for bathing and dressing as well as to manage colostomy. Pt has personal aid that comes in 2 hours a day to A with household management and cooking    Prior Function Level of Independence: Independent with assistive device(s)      Comments: pt able to move about in her apartment with RW unassisted, elevated surfaces to stand from and prefers  seats she can place her feet under to assist with standing   PT Goals (current goals can now be found in the care plan section) Acute Rehab PT Goals Patient Stated Goal: home when able. PT Goal Formulation: With patient Time For Goal Achievement: 06/22/15 Potential to  Achieve Goals: Good    Frequency  Min 3X/week    PT Plan      Co-evaluation             End of Session   Activity Tolerance: Patient limited by fatigue;Patient tolerated treatment well Patient left: in bed;with call bell/phone within reach     Time: [id number] PT Time Calculation (min) (ACUTE ONLY): 27 min  Charges:  $Gait Training: 8-22 mins                    G Codes:      Pamela Maddy, Tessie Fass 06/08/2015, 12:50 PM 06/08/2015  Donnella Sham, [place] [phone]  (pager)

## 2015-06-08 NOTE — Progress Notes (Signed)
Subjective: She is still vomiting and won't allow anyone but IR to attempt NG.  Her abdomen is softer, and there is some new liquid stool in the ostomy bag.  She is still distended, but not as tight as yesterday.  She says the nausea medicine isn't working.  Objective: Vital signs in last 24 hours: Temp:  [97.6 F (36.4 C)-99 F (37.2 C)] 98.9 F (37.2 C) (12/14 0721) Pulse Rate:  [113-118] 118 (12/14 0357) Resp:  [11-20] 20 (12/14 0357) BP: (103-127)/(76-85) 103/76 mmHg (12/14 0357) SpO2:  [94 %-98 %] 94 % (12/14 0357) Last BM Date: 06/07/15 1550 emesis, still no NG 300 from the ostomy recorded Afebrile, BP stable tachy with one HR up to 203 recorded K+ 3.2 Malnourished prealbumin 16.9 CBC OK Intake/Output from previous day: 12/13 0701 - 12/14 0700 In: 600 [P.O.:75; I.V.:525] Out: 1850 [Emesis/NG output:1550; Stool:300] Intake/Output this shift:    General appearance: alert, cooperative, no distress and nauseated and thinks she may have to vomit again soon.   GI: soft, very distended visible loops thru the ventral hernia, BS hyperacitve, some liquids stool in the ostomy bag.  Lab Results:   Recent Labs  06/07/15 0609 06/08/15 0347  WBC 7.4 4.2  HGB 11.8* 11.7*  HCT 37.4 37.2  PLT 243 226    BMET  Recent Labs  06/07/15 0609 06/08/15 0347  NA 140 143  K 3.9 3.2*  CL 103 107  CO2 28 29  GLUCOSE 154* 133*  BUN 7 10  CREATININE 0.82 0.77  CALCIUM 8.7* 8.5*   PT/INR No results for input(s): LABPROT, INR in the last 72 hours.   Recent Labs Lab 06/06/15 2051 06/07/15 0609  AST 24 20  ALT 16 14  ALKPHOS 95 82  BILITOT 0.7 0.8  PROT 7.4 6.2*  ALBUMIN 4.3 3.5     Lipase     Component Value Date/Time   LIPASE 40 06/06/2015 2051     Studies/Results: Ct Abdomen Pelvis W Contrast  06/06/2015  CLINICAL DATA:  Pain all over the abdomen. Nausea and vomiting. Colostomy feels plugged up. EXAM: CT ABDOMEN AND PELVIS WITH CONTRAST TECHNIQUE:  Multidetector CT imaging of the abdomen and pelvis was performed using the standard protocol following bolus administration of intravenous contrast. CONTRAST:  172mL OMNIPAQUE IOHEXOL 300 MG/ML  SOLN COMPARISON:  11/22/2014 FINDINGS: Atelectasis in the lung bases.  Motion artifact limits evaluation. Multiple low-attenuation lesions in the liver likely representing cysts. Largest measures 2 cm diameter. No change since previous study. Gallbladder is contracted. No bile duct dilatation. Diffuse pancreatic atrophy with mild pancreatic ductal dilatation no change since prior study. The spleen, adrenal glands, inferior vena cava, and retroperitoneal lymph nodes are unremarkable. 18 mm mass in the posterior left kidney demonstrates increased density possibly representing hemorrhagic cyst although enhancing renal cell carcinoma is not excluded. Appearance is similar to previous study. Stomach is unremarkable. There is an anterior abdominal wall hernia containing several loops of small bowel. There is dilatation of fluid-filled small bowel with transition zone in the left lower abdomen at the base of the hernia. Small bowel loops within the hernia appear dilated. This suggest small bowel obstruction, possibly due to adhesions or hernia. No significant bowel wall thickening. Left lower quadrant ostomy contains contrast material suggesting that this is not a complete obstruction although the relative decompression of the distal bowel suggests moderately high-grade obstruction. No free air or free fluid in the abdomen. Pelvis: Multiple decompressed small bowel loops are present. Bladder wall is  not thickened but there is a small diverticulum in the posterior left bladder wall. No intraluminal filling defects. Enhancing lesion in the uterus consistent with a fibroid. No adnexal masses. Small left inguinal hernia containing a small amount of fluid. This is unchanged since prior study. Small amount of free fluid in the pelvis.  Prominent degenerative changes and scoliosis of the lumbar spine. Prominent degenerative changes in the hips. Old healed fracture deformity of the right inferior pubic ramus. IMPRESSION: Small bowel obstruction with transition zone in the mid abdomen possibly due to adhesion or possibly due to anterior abdominal wall hernia which contains dilated small bowel. Contrast material is present in the stoma suggesting that this is not a complete obstruction. However, decompression of distal loops suggest moderately high-grade obstruction is present. Small amount of free fluid in the pelvis. Small left inguinal hernia containing fluid. Indeterminate lesion in the left kidney again demonstrated. Uterine fibroid. Cyst in the liver. Electronically Signed   By: Lucienne Capers M.D.   On: 06/06/2015 23:27    Medications: . lidocaine  15 mL Mouth/Throat Once   . sodium chloride 50 mL/hr at 06/08/15 [id number]   Assessment/Plan Recurrent SBO, or PSBO  S/p exploratory laparotomy, sigmoid colectomy/colostomy 11/27/12, Lysis of adhesions/enterotomy rpeair 01/06/13, Dr. 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  Bilateral knee replacements Hypertension Gout Hypokalemia Atrial flutter  DVT: SCD/she can have heparin or Lovenox for DVT prophylaxis from our standpoint Antibiotics: None   Plan:  I have told her again she needs to allow them to place the NG.  Hopefull IR can do it this AM.  I told her the nausea medicine will not work as long as her stomach keeps filling up.  I will add some KCL to the IV to help with the hypokalemia    LOS: 2 days    [name] 06/08/2015

## 2015-06-08 NOTE — Progress Notes (Signed)
PT Cancellation Note  Patient Details Name: [name] MRN: [id number] DOB: Aug 15, 1955   Cancelled Treatment:    Reason Eval/Treat Not Completed: Patient at procedure or test/unavailable.  Pt down in IR to place NG.  Has been nauseated this am.  Will see as able to return. 06/08/2015  Donnella Sham, [place] [phone]  (pager)   Sharlisa Hollifield, Tessie Fass 06/08/2015, 10:14 AM

## 2015-06-08 NOTE — Progress Notes (Signed)
Foley placed with additional RN at bedside in compliance to [place] and procedure at 1730. Patient tolerated procedure well

## 2015-06-08 NOTE — Progress Notes (Signed)
[place] TEAM 1 - Stepdown/ICU TEAM PROGRESS NOTE  [name] [id number] DOB: [birth year] DOA: 06/06/2015 PCP: [name], MD  Admit HPI / Brief Narrative: 59 y.o. female with history of hypertension, chronic systolic heart failure, atrial flutter status post ablation, and diverting colostomy for diverticulitis who presented to [facility] ED with 1-2 days duration of progressively worsening epigastric pain, initially intermittent in nature and constant for the preceding 8 hours PTA, 10/10 in severity and associated with N/V, poor oral intake.   In the ER, CT abdomen and pelvis showed small bowel obstruction with transition point.   HPI/Subjective: Pt was able to have NG placed in IR today.  She c/o feel thirsty and asks for ice chips.  She denies current nausea, vomiting, cp, sob, or HA.    Assessment/Plan:  SBO -NG in place - begin intermittent wall suction - antiemetics as needed -Gen Surgery following w/ [place]   Hypokalemia  -goal is 4.0 or > to improve GI motility   Essential HTN -BP currently well controlled  DH -clinically pt appears quite dry - increase IVF and follow   Chronic systolic CHF -last TTE in 2015 with EF 20% -gentle hydration to continue as pt clearly still HD presently - close monitoring of volume status   Gouty arthropathy -stable   Hyperglycemia -check A1c  Blindness  Code Status: FULL Family Communication: no family present at time of exam Disposition Plan: SDU   Consultants: Gen Surgery   Procedures: none  Antibiotics: none  DVT prophylaxis: SCDs  Objective: Blood pressure 109/78, pulse 112, temperature 98.8 F (37.1 C), temperature source Oral, resp. rate 18, height 5\' 8"  (1.727 m), weight 59.2 kg (130 lb 8.2 oz), last menstrual period 06/25/2006, SpO2 95 %.  Intake/Output Summary (Last 24 hours) at 06/08/15 1621 Last data filed at 06/08/15 1449  Gross per 24 hour  Intake    600 ml  Output   1550 ml  Net   -950 ml    Exam: General: No acute respiratory distress - alert and conversant  Lungs: Clear to auscultation bilaterally without wheezes or crackles Cardiovascular: mild tachycardia at 110bpm without murmur gallop or rub normal S1 and S2 Abdomen: Nontender, soft, bowel sounds hypoactive, ostomy bag in place w/ some gas Extremities: No significant cyanosis, clubbing, or edema bilateral lower extremities  Data Reviewed: Basic Metabolic Panel:  Recent Labs Lab 06/06/15 2051 06/07/15 0609 06/08/15 0347  NA 141 140 143  K 5.1 3.9 3.2*  CL 103 103 107  CO2 29 28 29   GLUCOSE 112* 154* 133*  BUN 9 7 10   CREATININE 0.77 0.82 0.77  CALCIUM 10.2 8.7* 8.5*  MG  --   --  1.7    CBC:  Recent Labs Lab 06/06/15 2051 06/07/15 0609 06/08/15 0347  WBC 9.4 7.4 4.2  NEUTROABS  --  6.1  --   HGB 12.7 11.8* 11.7*  HCT 39.1 37.4 37.2  MCV 95.6 96.6 97.4  PLT 254 243 226    Liver Function Tests:  Recent Labs Lab 06/06/15 2051 06/07/15 0609  AST 24 20  ALT 16 14  ALKPHOS 95 82  BILITOT 0.7 0.8  PROT 7.4 6.2*  ALBUMIN 4.3 3.5    Recent Labs Lab 06/06/15 2051  LIPASE 40   CBG:  Recent Labs Lab 06/07/15 2307 06/08/15 0357 06/08/15 0724 06/08/15 1120 06/08/15 1444  GLUCAP 130* 134* 109* 116* 93    Studies:   Recent x-ray studies have been reviewed in detail by the  Attending Physician  Scheduled Meds:  Scheduled Meds: . lidocaine  15 mL Mouth/Throat Once  . lidocaine        Time spent on care of this patient: 35 mins   MCCLUNG,JEFFREY T , MD   [facility] Office  [phone] Pager - Text Page per Shea Evans as per below:  On-Call/Text Page:      [email or web address]      password [email or web address]  If 7PM-7AM, please contact night-coverage [email or web address] Password [email or web address] 06/08/2015, 4:21 PM   LOS: 2 days

## 2015-06-09 DIAGNOSIS — M1009 Idiopathic gout, multiple sites: Secondary | ICD-10-CM

## 2015-06-09 DIAGNOSIS — R739 Hyperglycemia, unspecified: Secondary | ICD-10-CM | POA: Diagnosis present

## 2015-06-09 DIAGNOSIS — I5032 Chronic diastolic (congestive) heart failure: Secondary | ICD-10-CM | POA: Diagnosis present

## 2015-06-09 DIAGNOSIS — H540 Blindness, both eyes: Secondary | ICD-10-CM

## 2015-06-09 DIAGNOSIS — I5022 Chronic systolic (congestive) heart failure: Secondary | ICD-10-CM

## 2015-06-09 DIAGNOSIS — I5042 Chronic combined systolic (congestive) and diastolic (congestive) heart failure: Secondary | ICD-10-CM | POA: Diagnosis present

## 2015-06-09 DIAGNOSIS — Q8740 Marfan's syndrome, unspecified: Secondary | ICD-10-CM

## 2015-06-09 LAB — COMPREHENSIVE METABOLIC PANEL WITH GFR
ALT: 11 U/L — ABNORMAL LOW (ref 14–54)
AST: 16 U/L (ref 15–41)
Albumin: 3 g/dL — ABNORMAL LOW (ref 3.5–5.0)
Alkaline Phosphatase: 59 U/L (ref 38–126)
Anion gap: 6 (ref 5–15)
BUN: 11 mg/dL (ref 6–20)
CO2: 26 mmol/L (ref 22–32)
Calcium: 8.1 mg/dL — ABNORMAL LOW (ref 8.9–10.3)
Chloride: 112 mmol/L — ABNORMAL HIGH (ref 101–111)
Creatinine, Ser: 0.65 mg/dL (ref 0.44–1.00)
GFR calc Af Amer: >60 mL/min (ref >60)
GFR calc non Af Amer: >60 mL/min (ref >60)
Glucose, Bld: 80 mg/dL (ref 65–99)
Potassium: 4.2 mmol/L (ref 3.5–5.1)
Sodium: 144 mmol/L (ref 135–145)
Total Bilirubin: 0.9 mg/dL (ref 0.3–1.2)
Total Protein: 5.8 g/dL — ABNORMAL LOW (ref 6.5–8.1)

## 2015-06-09 LAB — GLUCOSE, CAPILLARY
Glucose-Capillary: 86 mg/dL (ref 65–99)
Glucose-Capillary: 76 mg/dL (ref 65–99)
Glucose-Capillary: 56 mg/dL — ABNORMAL LOW (ref 65–99)

## 2015-06-09 LAB — MAGNESIUM: Magnesium: 1.7 mg/dL (ref 1.7–2.4)

## 2015-06-09 NOTE — Progress Notes (Signed)
[place] TEAM 1 - Stepdown/ICU TEAM Progress Note  KEONTAE CH [id number] DOB: September 11, 1955 DOA: 06/06/2015 PCP: [name], MD  Admit HPI / Brief Narrative: 59 y.o. 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   In the ER, CT abdomen and pelvis showed small bowel obstruction with transition point.    HPI/Subjective: 12/15 A/O 4, states negative N/V, abdominal pain has decreased significantly, positive flatulence.   Assessment/Plan: SBO -NG in place - begin intermittent wall suction - antiemetics as needed -Gen Surgery following w/ [place]  -Ambulate BID -If patient continues to improve will attempt to start clear fluids in the A.m.  Hypokalemia  -goal is 4.0 or >  -to improve GI motility   Essential HTN -BP currently well controlled  DH -clinically pt appears quite dry  -See normal saline ml/hr    Chronic systolic CHF -last TTE in 2015 with EF 20% -gentle hydration to continue as pt clearly still HD presently  -Strict I&O -Daily weight   Gouty arthropathy/Marfan syndrome -stable   Hyperglycemia -check A1c pending  Blindness    Code Status: FULL Family Communication: no family present at time of exam Disposition Plan: Resolution SBO    Consultants: Dr.Douglas Ninfa Linden CCS  Procedure/Significant Events: NA   Culture   Antibiotics: NA  DVT prophylaxis: SCD   Devices NA   LINES / TUBES:  NA    Continuous Infusions: . 0.9 % NaCl with KCl 40 mEq / L 80 mL/hr at 06/09/15 0400    Objective: VITAL SIGNS: Temp: 98.8 F (37.1 C) (12/15  1448) Temp Source: Oral (12/15 1448) SPO2; FIO2:   Intake/Output Summary (Last 24 hours) at 06/09/15 1841 Last data filed at 06/09/15 1452  Gross per 24 hour  Intake 798.67 ml  Output   1750 ml  Net -951.33 ml     Exam: General: A/O 4, states negative N/V, abdominal pain has decreased , No acute respiratory distress Eyes: Patient is blind, both eyes hypertrophied/absent  ENT: Negative Runny nose, negative ear pain, negative gingival bleeding, Neck:  Negative scars, masses, torticollis, lymphadenopathy, JVD Lungs: Clear to auscultation bilaterally without wheezes or crackles Cardiovascular: Regular rate and rhythm without murmur gallop or rub normal S1 and S2 Abdomen: Positive mild abdominal pain to palpation, mildly distended, positive soft, bowel sounds, no rebound, no ascites, no appreciable mass, LLQ colostomy bag in place some stool Extremities: patient's bilateral upper extremities have deformities of hands and arms consistent with Marfan's syndrome Psychiatric:  Negative depression, negative anxiety, negative fatigue, negative mania  Neurologic:  Cranial nerves II through XII intact, tongue/uvula midline, all extremities muscle strength 5/5, sensation intact throughout,  negative dysarthria, negative expressive aphasia, negative receptive aphasia.   Data Reviewed: Basic Metabolic Panel:  Recent Labs Lab 06/06/15 2051 06/07/15 0609 06/08/15 0347 06/09/15 0421  NA 141 140 143 144  K 5.1 3.9 3.2* 4.2  CL 103 103 107 112*  CO2 29 28 29 26   GLUCOSE 112* 154* 133* 80  BUN 9 7 10 11   CREATININE 0.77 0.82 0.77 0.65  CALCIUM 10.2 8.7* 8.5* 8.1*  MG  --   --  1.7 1.7   Liver Function  Tests:  Recent Labs Lab 06/06/15 2051 06/07/15 0609 06/09/15 0421  AST 24 20 16   ALT 16 14 11*  ALKPHOS 95 82 59  BILITOT 0.7 0.8 0.9  PROT 7.4 6.2* 5.8*  ALBUMIN 4.3 3.5 3.0*    Recent Labs Lab 06/06/15 2051  LIPASE 40   No results for input(s): AMMONIA in the last 168  hours. CBC:  Recent Labs Lab 06/06/15 2051 06/07/15 0609 06/08/15 0347  WBC 9.4 7.4 4.2  NEUTROABS  --  6.1  --   HGB 12.7 11.8* 11.7*  HCT 39.1 37.4 37.2  MCV 95.6 96.6 97.4  PLT 254 243 226   Cardiac Enzymes: No results for input(s): CKTOTAL, CKMB, CKMBINDEX, TROPONINI in the last 168 hours. BNP (last 3 results) No results for input(s): BNP in the last 8760 hours.  ProBNP (last 3 results) No results for input(s): PROBNP in the last 8760 hours.  CBG:  Recent Labs Lab 06/08/15 1444 06/08/15 2007 06/09/15 0020 06/09/15 0351 06/09/15 1446  GLUCAP 93 87 86 76 56*    No results found for this or any previous visit (from the past 240 hour(s)).   Studies:  Recent x-ray studies have been reviewed in detail by the Attending Physician  Scheduled Meds:  Scheduled Meds: . lidocaine  15 mL Mouth/Throat Once    Time spent on care of this patient: 40 mins   Tevin Shillingford, Geraldo Docker , MD  [facility] Office  [phone] Pager - [phone]  On-Call/Text Page:      [email or web address]      password [email or web address]  If 7PM-7AM, please contact night-coverage [email or web address] Password [email or web address] 06/09/2015, 6:41 PM   LOS: 3 days   Care during the described time interval was provided by me .  I have reviewed this patient's available data, including medical history, events of note, physical examination, and all test results as part of my evaluation. I have personally reviewed and interpreted all radiology studies.   Dia Crawford, MD [phone] Pager

## 2015-06-09 NOTE — Care Management Important Message (Signed)
Important Message  Patient Details  Name: [name] MRN: [id number] Date of Birth: [birth year]   Medicare Important Message Given:  Yes    Louanne Belton 06/09/2015, 2:39 [place] Message  Patient Details  Name: [name] MRN: [id number] Date of Birth: [birth year]   Medicare Important Message Given:  Yes    Armie Moren, Neal Dy 06/09/2015, 2:39 PM

## 2015-06-09 NOTE — Progress Notes (Signed)
Subjective: She is much better this AM, distension is almost resolved. She has hyperactive BS.  I can't tell if the NG is working or not.  The regulator is up all the way and wont allow you to turn it down.  Nursing is working on that now.    Objective: Vital signs in last 24 hours: Temp:  [98.1 F (36.7 C)-99.1 F (37.3 C)] 98.8 F (37.1 C) (12/15 0716) Pulse Rate:  [87-116] 87 (12/15 0350) Resp:  [17-21] 18 (12/15 0350) BP: (109-152)/(70-84) 152/80 mmHg (12/15 0350) SpO2:  [95 %-98 %] 97 % (12/15 0350) Last BM Date: 2015/07/08 300 from NG 450 from ostomy recorded Afebrile, vss K+ up to 4.2 Intake/Output from previous day: 07/08/23 0701 - 12/15 0700 In: 1333.3 [I.V.:1233.3; IV Piggyback:100] Out: 1550 [Urine:800; Emesis/NG output:300; Stool:450] Intake/Output this shift: Total I/O In: -  Out: 250 [Urine:250]  General appearance: alert, cooperative and no distress GI: soft, distension is almost gone.  She has hyperactive BS, and liquid coming from the colostomy.    Lab Results:   Recent Labs  06/07/15 0609 07-08-2015 0347  WBC 7.4 4.2  HGB 11.8* 11.7*  HCT 37.4 37.2  PLT 243 226    BMET  Recent Labs  08-Jul-2015 0347 06/09/15 0421  NA 143 144  K 3.2* 4.2  CL 107 112*  CO2 29 26  GLUCOSE 133* 80  BUN 10 11  CREATININE 0.77 0.65  CALCIUM 8.5* 8.1*   PT/INR No results for input(s): LABPROT, INR in the last 72 hours.   Recent Labs Lab 06/06/15 2051 06/07/15 0609 06/09/15 0421  AST 24 20 16   ALT 16 14 11*  ALKPHOS 95 82 59  BILITOT 0.7 0.8 0.9  PROT 7.4 6.2* 5.8*  ALBUMIN 4.3 3.5 3.0*     Lipase     Component Value Date/Time   LIPASE 40 06/06/2015 2051     Studies/Results: Dg Abd 2 Views  Jul 08, 2015  CLINICAL DATA:  NG tube placed under fluoro for stomach decompression EXAM: ABDOMEN - 2 VIEW; DG NASO G TUBE PLC W/FL-NO RAD FLUOROSCOPY TIME:  Radiation Exposure Index (as provided by the fluoroscopic device): 62.9 d Gy cm2 COMPARISON:  None.  FINDINGS: Enteric tube likely terminates in the 1st portion of the duodenum. Mildly prominent loops of small bowel in the central abdomen. Thoracolumbar levoscoliosis. IMPRESSION: Enteric tube likely terminates in the 1st portion of the duodenum. Electronically Signed   By: Julian Hy M.D.   On: 07-08-2015 10:19   Dg Addison Bailey G Tube Plc W/fl-no Rad  07/08/2015  CLINICAL DATA:  NASO G TUBE PLACEMENT WITH FLUORO Fluoroscopy was utilized by the requesting physician.  No radiographic interpretation.    Medications: . lidocaine  15 mL Mouth/Throat Once    Assessment/Plan Recurrent SBO, or PSBO  S/p exploratory laparotomy, sigmoid colectomy/colostomy 11/27/12, Lysis of adhesions/enterotomy rpeair 01/06/13, Dr. 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  Bilateral knee replacements Hypertension Gout Hypokalemia Atrial flutter  DVT: SCD/she can have heparin or Lovenox for DVT prophylaxis from our standpoint Antibiotics: None   Plan:  I think she is much better.  I would leave her on suction for now and see that she is as decompressed as we can get her.  Hopefully clamp the tube and start clears tomorrow.  I would work to mobilize her here  in hospital also.    LOS: 3 days    Arek Spadafore 06/09/2015

## 2015-06-10 ENCOUNTER — Inpatient Hospital Stay (HOSPITAL_COMMUNITY): Payer: Medicare Other

## 2015-06-10 DIAGNOSIS — I272 Other secondary pulmonary hypertension: Secondary | ICD-10-CM

## 2015-06-10 LAB — COMPREHENSIVE METABOLIC PANEL
ALT: 17 U/L (ref 14–54)
AST: 23 U/L (ref 15–41)
Albumin: 3.1 g/dL — ABNORMAL LOW (ref 3.5–5.0)
Alkaline Phosphatase: 71 U/L (ref 38–126)
Anion gap: 11 (ref 5–15)
BUN: 13 mg/dL (ref 6–20)
CO2: 23 mmol/L (ref 22–32)
Calcium: 8.5 mg/dL — ABNORMAL LOW (ref 8.9–10.3)
Chloride: 108 mmol/L (ref 101–111)
Creatinine, Ser: 0.79 mg/dL (ref 0.44–1.00)
GFR calc Af Amer: >60 mL/min (ref >60)
GFR calc non Af Amer: >60 mL/min (ref >60)
Glucose, Bld: 61 mg/dL — ABNORMAL LOW (ref 65–99)
Potassium: 4.1 mmol/L (ref 3.5–5.1)
Sodium: 142 mmol/L (ref 135–145)
Total Bilirubin: 1.3 mg/dL — ABNORMAL HIGH (ref 0.3–1.2)
Total Protein: 5.9 g/dL — ABNORMAL LOW (ref 6.5–8.1)

## 2015-06-10 LAB — MRSA PCR SCREENING: MRSA by PCR: NEGATIVE

## 2015-06-10 LAB — MAGNESIUM: Magnesium: 1.7 mg/dL (ref 1.7–2.4)

## 2015-06-10 MED ORDER — METOPROLOL TARTRATE 12.5 MG HALF TABLET
12.5000 mg | ORAL_TABLET | Freq: Two times a day (BID) | ORAL | Status: DC
  Administered 2015-06-10 – 2015-06-14 (×7): 12.5 mg via ORAL
  Filled 2015-06-10 (×8): qty 1

## 2015-06-10 NOTE — Progress Notes (Signed)
Physical Therapy Treatment Patient Details Name: [name] MRN: [id number] DOB: 11/15/1955 Today's Date: 06/10/2015    History of Present Illness pt is 59 y/o female with h/o blindness, Marfan's sydrome, osteoporosis, HTN, substance abuse, bil TKA's, stroke, RA, admitted with abdominal pain due to SBO    PT Comments    Pt needed max encouragement to sit up in chair after session.  Overall continues to progress well with PT.  Follow Up Recommendations  Home health PT;Supervision - Intermittent     Equipment Recommendations  None recommended by PT    Recommendations for Other Services       Precautions / Restrictions Precautions Precautions: Fall    Mobility  Bed Mobility Overal bed mobility: Needs Assistance Bed Mobility: Supine to Sit     Supine to sit: Min assist     General bed mobility comments: min A to scoot to EOB  Transfers Overall transfer level: Needs assistance Equipment used: Rolling walker (2 wheeled) Transfers: Sit to/from Stand Sit to Stand: Min guard         General transfer comment: bed elevated per pt request  Ambulation/Gait Ambulation/Gait assistance: Min guard Ambulation Distance (Feet): 60 Feet Assistive device: Rolling walker (2 wheeled) Gait Pattern/deviations: Step-through pattern;Decreased step length - right;Decreased step length - left   Gait velocity interpretation: Below normal speed for age/gender General Gait Details: generally unsteady with tendency to list R on occasion.  Needed assist stabilizing the RW and guiding due to blindness.   Stairs            Wheelchair Mobility    Modified Rankin (Stroke Patients Only)       Balance   Sitting-balance support: No upper extremity supported Sitting balance-Leahy Scale: Fair     Standing balance support: Bilateral upper extremity supported;During functional activity Standing balance-Leahy Scale: Poor                      Cognition  Arousal/Alertness: Awake/alert Behavior During Therapy: Flat affect Overall Cognitive Status: Within Functional Limits for tasks assessed                      Exercises      General Comments        Pertinent Vitals/Pain Pain Assessment: No/denies pain    Home Living                      Prior Function            PT Goals (current goals can now be found in the care plan section) Acute Rehab PT Goals Patient Stated Goal: home when able. PT Goal Formulation: With patient Time For Goal Achievement: 06/22/15 Potential to Achieve Goals: Good Progress towards PT goals: Progressing toward goals    Frequency  Min 3X/week    PT Plan Current plan remains appropriate    Co-evaluation             End of Session   Activity Tolerance: Patient tolerated treatment well;Patient limited by fatigue Patient left: in chair;with call bell/phone within reach     Time: 1456-1515 PT Time Calculation (min) (ACUTE ONLY): 19 min  Charges:  $Gait Training: 8-22 mins                    G Codes:        Laureen Abrahams, PT, DPT 06/10/2015 3:21 PM [id number]

## 2015-06-10 NOTE — Progress Notes (Signed)
[place] TEAM 1 - Stepdown/ICU TEAM Progress Note  [name] [id number] DOB: 11-14-1955 DOA: 06/06/2015 PCP: [name], MD  Admit HPI / Brief Narrative: 59 y.o. 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   In the ER, CT abdomen and pelvis showed small bowel obstruction with transition point.    HPI/Subjective: 12/16 A/O 4, states negative postprandial N/V, abdominal pain has essentially resolved.   Assessment/Plan: SBO -Gen Surgery following w/ [place]  -Ambulate BID; without SOB, DOE, abdominal pain. -Tolerating clear fluids. Will advance to full liquids in the A.m.  Hypokalemia  -goal is 4.0 or >  -to improve GI motility   Essential HTN -BP and HR trending up. Start metoprolol 12.5 mg BID  Dehydration -Continue saline 75 ml/hr    Chronic systolic CHF -last TTE in 2015 with EF 20% -gentle hydration to continue as pt clearly still HD presently  -Strict I&O since admission -855ml -Daily weight   Gouty arthropathy/Marfan syndrome -stable   Hyperglycemia -check A1c pending  Blindness    Code Status: FULL Family Communication: no family present at time of exam Disposition Plan: Resolution SBO    Consultants: Dr.Douglas Ninfa Linden CCS  Procedure/Significant Events: NA   Culture   Antibiotics: NA  DVT prophylaxis: SCD   Devices NA   LINES / TUBES:  NA    Continuous Infusions: . 0.9 % NaCl with KCl 40 mEq / L 80 mL/hr at 06/09/15 0400    Objective: VITAL SIGNS: Temp: 98.6 F (37 C) (12/16 1428) Temp Source: Oral (12/16 1428) BP:  136/88 mmHg (12/16 0400) Pulse Rate: 107 (12/16 0400) SPO2; FIO2:   Intake/Output Summary (Last 24 hours) at 06/10/15 1540 Last data filed at 06/10/15 1428  Gross per 24 hour  Intake   2080 ml  Output   1300 ml  Net    780 ml     Exam: General: A/O 4, states negative N/V, abdominal pain almost completely resolved , No acute respiratory distress sitting in chair comfortably. Eyes: Patient is blind, both eyes hypertrophied/absent  ENT: Negative Runny nose, negative ear pain, negative gingival bleeding, Neck:  Negative scars, masses, torticollis, lymphadenopathy, JVD Lungs: Clear to auscultation bilaterally without wheezes or crackles Cardiovascular: Tachycardic, Regular  rhythm without murmur gallop or rub normal S1 and S2 Abdomen: Positive mild abdominal pain to palpation, mildly distended, positive soft, bowel sounds, no rebound, no ascites, no appreciable mass, LLQ colostomy bag in place some stool Extremities: patient's bilateral upper extremities have deformities of hands and arms consistent with Marfan's syndrome Psychiatric:  Negative depression, negative anxiety, negative fatigue, negative mania  Neurologic:  Cranial nerves II through XII intact, tongue/uvula midline, all extremities muscle strength 5/5, sensation intact throughout,  negative dysarthria, negative expressive aphasia, negative receptive aphasia.   Data Reviewed: Basic Metabolic Panel:  Recent Labs Lab 06/06/15 2051 06/07/15 0609 06/08/15 0347 06/09/15 0421 06/10/15 0429  NA 141 140 143 144 142  K 5.1 3.9 3.2* 4.2 4.1  CL 103 103 107 112* 108  CO2 29 28 29 26 23   GLUCOSE 112* 154* 133* 80 61*  BUN 9 7 10 11 13   CREATININE 0.77 0.82 0.77 0.65  0.79  CALCIUM 10.2 8.7* 8.5* 8.1* 8.5*  MG  --   --  1.7 1.7 1.7   Liver Function Tests:  Recent Labs Lab 06/06/15 2051 06/07/15 0609 06/09/15 0421 06/10/15 0429  AST 24 20 16 23   ALT 16 14 11* 17  ALKPHOS 95 82 59 71  BILITOT 0.7 0.8 0.9 1.3*  PROT  7.4 6.2* 5.8* 5.9*  ALBUMIN 4.3 3.5 3.0* 3.1*    Recent Labs Lab 06/06/15 2051  LIPASE 40   No results for input(s): AMMONIA in the last 168 hours. CBC:  Recent Labs Lab 06/06/15 2051 06/07/15 0609 06/08/15 0347  WBC 9.4 7.4 4.2  NEUTROABS  --  6.1  --   HGB 12.7 11.8* 11.7*  HCT 39.1 37.4 37.2  MCV 95.6 96.6 97.4  PLT 254 243 226   Cardiac Enzymes: No results for input(s): CKTOTAL, CKMB, CKMBINDEX, TROPONINI in the last 168 hours. BNP (last 3 results) No results for input(s): BNP in the last 8760 hours.  ProBNP (last 3 results) No results for input(s): PROBNP in the last 8760 hours.  CBG:  Recent Labs Lab 06/08/15 1444 06/08/15 2007 06/09/15 0020 06/09/15 0351 06/09/15 1446  GLUCAP 93 87 86 76 56*    No results found for this or any previous visit (from the past 240 hour(s)).   Studies:  Recent x-ray studies have been reviewed in detail by the Attending Physician  Scheduled Meds:  Scheduled Meds: . lidocaine  15 mL Mouth/Throat Once    Time spent on care of this patient: 40 mins   Jin Shockley, Geraldo Docker , MD  [facility] Office  [phone] Pager - [phone]  On-Call/Text Page:      [email or web address]      password [email or web address]  If 7PM-7AM, please contact night-coverage [email or web address] Password [email or web address] 06/10/2015, 3:40 PM   LOS: 4 days   Care during the described time interval was provided by me .  I have reviewed this patient's available data, including medical history, events of note, physical examination, and all test results as part of my evaluation. I have personally reviewed and interpreted all radiology studies.   Dia Crawford, MD [phone] Pager

## 2015-06-10 NOTE — Progress Notes (Signed)
  Subjective: She was sleeping soundly this AM.  Distension in gone.  She has stool in her ostomy bag, but it is still very liquid in substance.  No complaints of pain except she can feel it when she has BM.   Objective: Vital signs in last 24 hours: Temp:  [98.2 F (36.8 C)-99 F (37.2 C)] 98.2 F (36.8 C) (12/16 0402) Pulse Rate:  [87-118] 107 (12/16 0400) Resp:  [13-20] 15 (12/16 0400) BP: (136-149)/(84-99) 136/88 mmHg (12/16 0400) SpO2:  [96 %-98 %] 97 % (12/16 0400) Weight:  [60 kg (132 lb 4.4 oz)] 60 kg (132 lb 4.4 oz) (12/16 0500) Last BM Date: 07/02/15 NG 50 recorded 200 from ostomy recorded Ice chips Afebrile, VSS, still tachycardic cmp OK Film pending Intake/Output from previous day: 12/15 0701 - 12/16 0700 In: 2080 [I.V.:2080] Out: 1650 [Urine:1400; Emesis/NG output:50; Stool:200] Intake/Output this shift:    General appearance: alert, cooperative and no distress GI: soft, nontender, distension is gone, few BS, liquid stool in the ostomy.  Lab Results:   Recent Labs  07/02/2015 0347  WBC 4.2  HGB 11.7*  HCT 37.2  PLT 226    BMET  Recent Labs  06/09/15 0421 06/10/15 0429  NA 144 142  K 4.2 4.1  CL 112* 108  CO2 26 23  GLUCOSE 80 61*  BUN 11 13  CREATININE 0.65 0.79  CALCIUM 8.1* 8.5*   PT/INR No results for input(s): LABPROT, INR in the last 72 hours.   Recent Labs Lab 06/06/15 2051 06/07/15 0609 06/09/15 0421 06/10/15 0429  AST 24 20 16 23   ALT 16 14 11* 17  ALKPHOS 95 82 59 71  BILITOT 0.7 0.8 0.9 1.3*  PROT 7.4 6.2* 5.8* 5.9*  ALBUMIN 4.3 3.5 3.0* 3.1*     Lipase     Component Value Date/Time   LIPASE 40 06/06/2015 2051     Studies/Results: Dg Abd 2 Views  07/02/2015  CLINICAL DATA:  NG tube placed under fluoro for stomach decompression EXAM: ABDOMEN - 2 VIEW; DG NASO G TUBE PLC W/FL-NO RAD FLUOROSCOPY TIME:  Radiation Exposure Index (as provided by the fluoroscopic device): 62.9 d Gy cm2 COMPARISON:  None. FINDINGS:  Enteric tube likely terminates in the 1st portion of the duodenum. Mildly prominent loops of small bowel in the central abdomen. Thoracolumbar levoscoliosis. IMPRESSION: Enteric tube likely terminates in the 1st portion of the duodenum. Electronically Signed   By: Julian Hy M.D.   On: 07/02/15 10:19   Dg Addison Bailey G Tube Plc W/fl-no Rad  07/02/2015  CLINICAL DATA:  NASO G TUBE PLACEMENT WITH FLUORO Fluoroscopy was utilized by the requesting physician.  No radiographic interpretation.    Medications: . lidocaine  15 mL Mouth/Throat Once    Assessment/Plan Recurrent SBO, or PSBO  S/p exploratory laparotomy, sigmoid colectomy/colostomy 11/27/12, Lysis of adhesions/enterotomy rpeair 01/06/13, Dr. 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  Bilateral knee replacements Hypertension Gout Hypokalemia Atrial flutter  DVT: SCD/she can have heparin or Lovenox for DVT prophylaxis from our standpoint Antibiotics: None   Plan:  Clamp NG, film pending.  Hope to pull NG and start clears today after film.       LOS: 4 days    [name] 06/10/2015

## 2015-06-11 LAB — COMPREHENSIVE METABOLIC PANEL
ALT: 17 U/L (ref 14–54)
AST: 24 U/L (ref 15–41)
Albumin: 2.9 g/dL — ABNORMAL LOW (ref 3.5–5.0)
Alkaline Phosphatase: 72 U/L (ref 38–126)
Anion gap: 8 (ref 5–15)
BUN: 7 mg/dL (ref 6–20)
CO2: 24 mmol/L (ref 22–32)
Calcium: 8.4 mg/dL — ABNORMAL LOW (ref 8.9–10.3)
Chloride: 108 mmol/L (ref 101–111)
Creatinine, Ser: 0.58 mg/dL (ref 0.44–1.00)
GFR calc Af Amer: >60 mL/min (ref >60)
GFR calc non Af Amer: >60 mL/min (ref >60)
Glucose, Bld: 96 mg/dL (ref 65–99)
Potassium: 4.3 mmol/L (ref 3.5–5.1)
Sodium: 140 mmol/L (ref 135–145)
Total Bilirubin: 1.1 mg/dL (ref 0.3–1.2)
Total Protein: 5.4 g/dL — ABNORMAL LOW (ref 6.5–8.1)

## 2015-06-11 LAB — HEMOGLOBIN A1C
Hgb A1c MFr Bld: 5.6 % (ref 4.8–5.6)
Mean Plasma Glucose: 114 mg/dL

## 2015-06-11 LAB — MAGNESIUM: Magnesium: 1.7 mg/dL (ref 1.7–2.4)

## 2015-06-11 NOTE — Progress Notes (Signed)
[place] TEAM 1 - Stepdown/ICU TEAM Progress Note  [name] [id number] DOB: Nov 05, 1955 DOA: 06/06/2015 PCP: [name], MD  Admit HPI / Brief Narrative: 59 y.o. 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   In the ER, CT abdomen and pelvis showed small bowel obstruction with transition point.    HPI/Subjective: 12/17 A/O 4, states negative postprandial N/V, decreased abdominal distention from this morning, currently negative abdominal pain.   Assessment/Plan: SBO -Gen Surgery following w/ [place]  -Ambulate BID; without SOB, DOE, abdominal pain. -Tolerating clear fluids. Patient with some increased distention this morning hold on advancing to full liquids in the   Hypokalemia  -goal is 4.0 or >  -to improve GI motility   Essential HTN -BP and HR trending up. Continue metoprolol 12.5 mg BID  Dehydration -Continue normal saline+ KCl 40 mEq/L 75 ml/hr    Chronic systolic CHF -last TTE in 2015 with EF 20% -gentle hydration to continue as pt clearly still DH presently  -Strict I&O since admission - 1.2 L -Daily weight       12/17 bed weight= 60.5 kg  Gouty arthropathy/Marfan syndrome -stable   Hyperglycemia -check A1c pending  Blindness    Code Status: FULL Family Communication: no family present at time of exam Disposition Plan: Resolution SBO    Consultants: Dr.Douglas Ninfa Linden CCS  Procedure/Significant Events: NA   Culture   Antibiotics: NA  DVT prophylaxis: SCD   Devices NA   LINES / TUBES:  NA    Continuous Infusions: . 0.9 % NaCl  with KCl 40 mEq / L 75 mL/hr (06/10/15 2113)    Objective: VITAL SIGNS: Temp: 98.4 F (36.9 C) (12/17 1548) Temp Source: Oral (12/17 1548) BP: 129/88 mmHg (12/17 1110) Pulse Rate: 107 (12/17 1110) SPO2; FIO2:   Intake/Output Summary (Last 24 hours) at 06/11/15 1627 Last data filed at 06/11/15 1548  Gross per 24 hour  Intake   1350 ml  Output   1675 ml  Net   -325 ml     Exam: General: A/O 4, states negative N/V, abdominal pain almost completely resolved , No acute respiratory distress sitting in bed comfortably. Eyes: Patient is blind, both eyes hypertrophied/absent  ENT: Negative Runny nose, negative ear pain, negative gingival bleeding, Neck:  Negative scars, masses, torticollis, lymphadenopathy, JVD Lungs: Clear to auscultation bilaterally without wheezes or crackles Cardiovascular: Regular  rhythm and rate without murmur gallop or rub normal S1 and S2 Abdomen: Negative abdominal pain to palpation, negative distended, positive soft, bowel sounds, no rebound, no ascites, no appreciable mass, LLQ colostomy bag in place some stool Extremities: patient's bilateral upper extremities have deformities of hands and arms consistent with Marfan's syndrome Psychiatric:  Negative depression, negative anxiety, negative fatigue, negative mania  Neurologic:  Cranial nerves II through XII intact, tongue/uvula midline, all extremities muscle strength 5/5, sensation intact throughout,  negative dysarthria, negative expressive aphasia, negative receptive aphasia.   Data Reviewed: Basic Metabolic Panel:  Recent Labs Lab 06/07/15 0609 06/08/15 0347 06/09/15 0421 06/10/15 0429 06/11/15 0455  NA 140 143 144 142 140  K 3.9 3.2* 4.2 4.1 4.3  CL 103 107 112* 108 108  CO2 28 29 26 23 24   GLUCOSE 154* 133* 80 61* 96  BUN 7 10 11 13 7   CREATININE 0.82 0.77 0.65 0.79 0.58  CALCIUM 8.7* 8.5* 8.1* 8.5* 8.4*  MG  --  1.7 1.7 1.7 1.7   Liver Function Tests:  Recent Labs Lab  06/06/15 2051 06/07/15 0609 06/09/15 0421 06/10/15 0429 06/11/15 0455  AST 24 20 16 23 24   ALT 16 14 11* 17 17  ALKPHOS 95 82 59 71 72  BILITOT 0.7 0.8 0.9 1.3* 1.1  PROT 7.4 6.2* 5.8* 5.9* 5.4*  ALBUMIN 4.3 3.5 3.0* 3.1* 2.9*    Recent Labs Lab 06/06/15 2051  LIPASE 40   No results for input(s): AMMONIA in the last 168 hours. CBC:  Recent Labs Lab 06/06/15 2051 06/07/15 0609 06/08/15 0347  WBC 9.4 7.4 4.2  NEUTROABS  --  6.1  --   HGB 12.7 11.8* 11.7*  HCT 39.1 37.4 37.2  MCV 95.6 96.6 97.4  PLT 254 243 226   Cardiac Enzymes: No results for input(s): CKTOTAL, CKMB, CKMBINDEX, TROPONINI in the last 168 hours. BNP (last 3 results) No results for input(s): BNP in the last 8760 hours.  ProBNP (last 3 results) No results for input(s): PROBNP in the last 8760 hours.  CBG:  Recent Labs Lab 06/08/15 1444 06/08/15 2007 06/09/15 0020 06/09/15 0351 06/09/15 1446  GLUCAP 93 87 86 76 56*    Recent Results (from the past 240 hour(s))  MRSA PCR Screening     Status: None   Collection Time: 06/10/15  1:53 PM  Result Value Ref Range Status   MRSA by PCR NEGATIVE NEGATIVE Final    Comment:        The GeneXpert MRSA Assay (FDA approved for NASAL specimens only), is one component of a comprehensive MRSA colonization surveillance program. It is not intended to diagnose MRSA infection nor to guide or monitor treatment for MRSA infections.      Studies:  Recent x-ray studies have been reviewed in detail by the Attending Physician  Scheduled Meds:  Scheduled Meds: . lidocaine  15 mL Mouth/Throat Once  . metoprolol tartrate  12.5 mg Oral BID    Time spent on care of this patient: 40 mins   Nissim Fleischer, Geraldo Docker , MD  [facility] Office  [phone] Pager - [phone]  On-Call/Text Page:      [email or web address]      password [email or web address]  If 7PM-7AM, please contact night-coverage [email or web address] Password [email or web address] 06/11/2015, 4:27 PM   LOS: 5 days   Care  during the described time interval was provided by me .  I have reviewed this patient's available data, including medical history, events of note, physical examination, and all test results as part of my evaluation. I have personally reviewed and interpreted all radiology studies.   Dia Crawford, MD [phone] Pager

## 2015-06-11 NOTE — Progress Notes (Signed)
  Subjective: No issues with clears, but she is more distended today.  She denies pain or nausea.  Ostomy has more fluid in it and allot of gas.    Objective: Vital signs in last 24 hours: Temp:  [98.1 F (36.7 C)-99.4 F (37.4 C)] 98.2 F (36.8 C) (12/17 0801) Pulse Rate:  [53-118] 93 (12/17 0436) Resp:  [13-27] 15 (12/17 0436) BP: (133-169)/(81-97) 139/81 mmHg (12/17 0436) SpO2:  [97 %-99 %] 97 % (12/17 0436) Weight:  [60.5 kg (133 lb 6.1 oz)] 60.5 kg (133 lb 6.1 oz) (12/17 0436) Last BM Date: 06/11/15 480 PO 375 stool Afebrile, some tachycardia, up to 150 at 3 AM Labs OK Intake/Output from previous day: 06/27/23 0701 - 12/17 0700 In: 1725 [P.O.:480; I.V.:1245] Out: 1800 [Urine:1425; Stool:375] Intake/Output this shift:    General appearance: alert, cooperative and no distress GI: soft, more distended this AM, but no pain.  she has fluid in her ostomy bag, and allot of gas.  Lab Results:  No results for input(s): WBC, HGB, HCT, PLT in the last 72 hours.  BMET  Recent Labs  06/27/15 0429 06/11/15 0455  NA 142 140  K 4.1 4.3  CL 108 108  CO2 23 24  GLUCOSE 61* 96  BUN 13 7  CREATININE 0.79 0.58  CALCIUM 8.5* 8.4*   PT/INR No results for input(s): LABPROT, INR in the last 72 hours.   Recent Labs Lab 06/06/15 2051 06/07/15 0609 06/09/15 0421 27-Jun-2015 0429 06/11/15 0455  AST 24 20 16 23 24   ALT 16 14 11* 17 17  ALKPHOS 95 82 59 71 72  BILITOT 0.7 0.8 0.9 1.3* 1.1  PROT 7.4 6.2* 5.8* 5.9* 5.4*  ALBUMIN 4.3 3.5 3.0* 3.1* 2.9*     Lipase     Component Value Date/Time   LIPASE 40 06/06/2015 2051     Studies/Results: Dg Abd 2 Views  June 27, 2015  CLINICAL DATA:  Small-bowel obstruction.  Abdominal pain. EXAM: ABDOMEN - 2 VIEW COMPARISON:  06/08/2015 .  CT 06/06/2015 . FINDINGS: NG tube noted in stable position. No gastric distention. Small-bowel dilatation is improved. Colonic gas pattern is normal. No free air. Aortoiliac atherosclerotic vascular  disease. Prominent lumbar spine scoliosis and degenerative change. Degenerative changes both hips . IMPRESSION: NG tube in stable position . Interim improvement of small bowel distention. Colonic gas pattern is normal. No free air. Electronically Signed   By: [place]   On: June 27, 2015 08:59    Medications: . lidocaine  15 mL Mouth/Throat Once  . metoprolol tartrate  12.5 mg Oral BID    Assessment/Plan Recurrent SBO, or PSBO  S/p exploratory laparotomy, sigmoid colectomy/colostomy 11/27/12, Lysis of adhesions/enterotomy rpeair 01/06/13, Dr. 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  Bilateral knee replacements Hypertension Gout Hypokalemia Atrial flutter  DVT: SCD/she can have heparin or Lovenox for DVT prophylaxis from our standpoint Antibiotics: None    Plan:  i was hoping to bump her up to fulls, but with distension I will leave her on clears for now.       LOS: 5 days    Tinea Nobile 06/11/2015

## 2015-06-12 LAB — COMPREHENSIVE METABOLIC PANEL
ALT: 15 U/L (ref 14–54)
AST: 20 U/L (ref 15–41)
Albumin: 2.8 g/dL — ABNORMAL LOW (ref 3.5–5.0)
Alkaline Phosphatase: 73 U/L (ref 38–126)
Anion gap: 6 (ref 5–15)
BUN: <5 mg/dL — ABNORMAL LOW (ref 6–20)
CO2: 25 mmol/L (ref 22–32)
Calcium: 8.5 mg/dL — ABNORMAL LOW (ref 8.9–10.3)
Chloride: 108 mmol/L (ref 101–111)
Creatinine, Ser: 0.48 mg/dL (ref 0.44–1.00)
GFR calc Af Amer: >60 mL/min (ref >60)
GFR calc non Af Amer: >60 mL/min (ref >60)
Glucose, Bld: 100 mg/dL — ABNORMAL HIGH (ref 65–99)
Potassium: 4.5 mmol/L (ref 3.5–5.1)
Sodium: 139 mmol/L (ref 135–145)
Total Bilirubin: 0.8 mg/dL (ref 0.3–1.2)
Total Protein: 5.4 g/dL — ABNORMAL LOW (ref 6.5–8.1)

## 2015-06-12 LAB — MAGNESIUM: Magnesium: 1.6 mg/dL — ABNORMAL LOW (ref 1.7–2.4)

## 2015-06-12 MED ORDER — MAGNESIUM OXIDE 400 (241.3 MG) MG PO TABS
400.0000 mg | ORAL_TABLET | Freq: Two times a day (BID) | ORAL | Status: AC
  Administered 2015-06-12 – 2015-06-13 (×4): 400 mg via ORAL
  Filled 2015-06-12 (×4): qty 1

## 2015-06-12 MED ORDER — POTASSIUM CHLORIDE IN NACL 20-0.9 MEQ/L-% IV SOLN
INTRAVENOUS | Status: DC
  Administered 2015-06-12 – 2015-06-13 (×2): via INTRAVENOUS
  Filled 2015-06-12: qty 1000

## 2015-06-12 NOTE — Progress Notes (Signed)
Pt transferred with 2 RN, with daughter. All belongings sent with her, cell phone, hearing aid and books.

## 2015-06-12 NOTE — Progress Notes (Signed)
  Subjective: She looks good this AM, abdomen is flat again this AM, no distension.  I don't know how much she drank yesterday, she says she drank as much as she wanted yesterday.  Objective: Vital signs in last 24 hours: Temp:  [98 F (36.7 C)-98.7 F (37.1 C)] 98.5 F (36.9 C) (12/18 0410) Pulse Rate:  [79-107] 95 (12/18 0410) Resp:  [15-17] 15 (12/18 0410) BP: (115-155)/(84-110) 115/91 mmHg (12/18 0410) SpO2:  [98 %-100 %] 98 % (12/18 0410) Weight:  [58.8 kg (129 lb 10.1 oz)] 58.8 kg (129 lb 10.1 oz) (12/18 0410) Last BM Date: 06/11/15 PO not recorded 150 from the ostomy Afebrile, VSS  HR up at times Labs show K+ up to 4.5, mag 1.6 Intake/Output from previous day: 12/17 0701 - 12/18 0700 In: 1705 [I.V.:1705] Out: 1800 [Urine:1650; Stool:150] Intake/Output this shift:    General appearance: alert, cooperative and no distress GI: soft, non-tender; bowel sounds normal; no masses,  no organomegaly and distension resolved this AM.  Lab Results:  No results for input(s): WBC, HGB, HCT, PLT in the last 72 hours.  BMET  Recent Labs  06/11/15 0455 06/12/15 0425  NA 140 139  K 4.3 4.5  CL 108 108  CO2 24 25  GLUCOSE 96 100*  BUN 7 <5*  CREATININE 0.58 0.48  CALCIUM 8.4* 8.5*   PT/INR No results for input(s): LABPROT, INR in the last 72 hours.   Recent Labs Lab 06/07/15 0609 06/09/15 0421 06-24-2015 0429 06/11/15 0455 06/12/15 0425  AST 20 16 23 24 20   ALT 14 11* 17 17 15   ALKPHOS 82 59 71 72 73  BILITOT 0.8 0.9 1.3* 1.1 0.8  PROT 6.2* 5.8* 5.9* 5.4* 5.4*  ALBUMIN 3.5 3.0* 3.1* 2.9* 2.8*     Lipase     Component Value Date/Time   LIPASE 40 06/06/2015 2051     Studies/Results: Dg Abd 2 Views  June 24, 2015  CLINICAL DATA:  Small-bowel obstruction.  Abdominal pain. EXAM: ABDOMEN - 2 VIEW COMPARISON:  06/08/2015 .  CT 06/06/2015 . FINDINGS: NG tube noted in stable position. No gastric distention. Small-bowel dilatation is improved. Colonic gas pattern is  normal. No free air. Aortoiliac atherosclerotic vascular disease. Prominent lumbar spine scoliosis and degenerative change. Degenerative changes both hips . IMPRESSION: NG tube in stable position . Interim improvement of small bowel distention. Colonic gas pattern is normal. No free air. Electronically Signed   By: [place]   On: 2015-06-24 08:59    Medications: . lidocaine  15 mL Mouth/Throat Once  . metoprolol tartrate  12.5 mg Oral BID    Assessment/Plan Recurrent SBO, or PSBO  S/p exploratory laparotomy, sigmoid colectomy/colostomy 11/27/12, Lysis of adhesions/enterotomy rpeair 01/06/13, Dr. 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  Bilateral knee replacements Hypertension Gout Hypokalemia -resolved Hypomagnesemia  Adding mag oxide for 2 days. Atrial flutter  DVT: SCD/she can have heparin or Lovenox for DVT prophylaxis from our standpoint Antibiotics: None    Plan:  Go up to full liquids today and see how she does.  I have reduced K+ in IV, and reduced the rate.  Labs are already in for tomorrow.  LOS: 6 days    Shawntavia Saunders 06/12/2015

## 2015-06-12 NOTE — Progress Notes (Signed)
UR COMPLETED  

## 2015-06-12 NOTE — Progress Notes (Signed)
[name] - Stepdown/ICU TEAM Progress Note  CAMEO LEVINGS [id number] DOB: 1955-09-10 DOA: 06/06/2015 PCP: [name], MD  Admit HPI / Brief Narrative: 59 y.o. 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   In the ER, CT abdomen and pelvis showed small bowel obstruction with transition point.    HPI/Subjective: 12/18 A/O 4, states negative postprandial N/V, negative postprandial distention/pain.   Assessment/Plan: SBO -Gen Surgery following w/ [place]  -Ambulate BID; without SOB, DOE, abdominal pain. -Tolerating full fluids.   Hypokalemia  -goal is 4.0 or >  -to improve GI motility   Essential HTN -Continue metoprolol 12.5 mg BID  Dehydration -Continue normal saline+ KCl 40 mEq/L 50 ml/hr    Chronic systolic CHF -last TTE in 2015 with EF 20% -gentle hydration to continue  -Strict I&O since admission - 2.7 L -Daily weight       12/18 bed weight= 58.8 kg  Gouty arthropathy/Marfan syndrome -stable   Hyperglycemia -12/15 hemoglobin A1c =5.6  Blindness  Hypomagnesemia -Mag-Ox 400 mg BID    Code Status: FULL Family Communication: no family present at time of exam Disposition Plan: Resolution SBO    Consultants: Dr.Douglas Ninfa Linden CCS  Procedure/Significant Events: NA   Culture   Antibiotics: NA  DVT prophylaxis: SCD   Devices NA   LINES / TUBES:  NA    Continuous Infusions: . 0.9 % NaCl with KCl 40 mEq / L 75 mL/hr (06/11/15 2205)    Objective: VITAL SIGNS: Temp: 98.5 F (36.9 C) (12/18 0410) Temp Source: Oral (12/18 0410) BP:  115/91 mmHg (12/18 0410) Pulse Rate: 95 (12/18 0410) SPO2; FIO2:   Intake/Output Summary (Last 24 hours) at 06/12/15 0858 Last data filed at 06/12/15 0600  Gross per 24 hour  Intake   1630 ml  Output   1800 ml  Net   -170 ml     Exam: General: A/O 4, states negative N/V, abdominal pain resolved , No acute respiratory distress sitting in bed comfortably. Eyes: Patient is blind, both eyes hypertrophied/absent  ENT: Negative Runny nose, negative ear pain, negative gingival bleeding, Neck:  Negative scars, masses, torticollis, lymphadenopathy, JVD Lungs: Clear to auscultation bilaterally without wheezes or crackles Cardiovascular: Regular  rhythm and rate without murmur gallop or rub normal S1 and S2 Abdomen: Negative abdominal pain to palpation, negative distended, positive soft, bowel sounds, no rebound, no ascites, no appreciable mass, LLQ colostomy bag in place some stool Extremities: patient's bilateral upper extremities have deformities of hands and arms consistent with Marfan's syndrome Psychiatric:  Negative depression, negative anxiety, negative fatigue, negative mania  Neurologic:  Cranial nerves II through XII intact, tongue/uvula midline, all extremities muscle strength 5/5, sensation intact throughout,  negative dysarthria, negative expressive aphasia, negative receptive aphasia.   Data Reviewed: Basic Metabolic Panel:  Recent Labs Lab 06/08/15 0347 06/09/15 0421 06/10/15 0429 06/11/15 0455 06/12/15 0425  NA 143 144 142 140 139  K 3.2* 4.2 4.[name] 4.3 4.5  CL 107 112* 108 108 108  CO2 29 26 23 24 25   GLUCOSE 133* 80 61* 96 100*  BUN 10 11 13 7  <5*  CREATININE 0.77 0.65 0.79 0.58 0.48  CALCIUM 8.5* 8.[name]* 8.5* 8.4* 8.5*  MG [name].7 [name].7 [name].7 [name].7 [name].6*   Liver Function Tests:  Recent Labs Lab 06/07/15 0609 06/09/15 0421 06/10/15 0429 06/11/15 0455 06/12/15 0425  AST 20 16 23 24 20   ALT 14 11* 17 17 15   ALKPHOS 82 59 71 72 73  BILITOT 0.8 0.9 [name].3* [name].[name] 0.8  PROT  6.2* 5.8* 5.9* 5.4* 5.4*  ALBUMIN 3.5 3.0* 3.[name]* 2.9* 2.8*    Recent Labs Lab 06/06/15 2051  LIPASE 40   No results for input(s): AMMONIA in the last 168 hours. CBC:  Recent Labs Lab 06/06/15 2051 06/07/15 0609 06/08/15 0347  WBC 9.4 7.4 4.2  NEUTROABS  --  6.[name]  --   HGB 12.7 11.8* 11.7*  HCT 39.[name] 37.4 37.2  MCV 95.6 96.6 97.4  PLT 254 243 226   Cardiac Enzymes: No results for input(s): CKTOTAL, CKMB, CKMBINDEX, TROPONINI in the last 168 hours. BNP (last 3 results) No results for input(s): BNP in the last 8760 hours.  ProBNP (last 3 results) No results for input(s): PROBNP in the last 8760 hours.  CBG:  Recent Labs Lab 06/08/15 1444 06/08/15 2007 06/09/15 0020 06/09/15 0351 06/09/15 1446  GLUCAP 93 87 86 76 56*    Recent Results (from the past 240 hour(s))  MRSA PCR Screening     Status: None   Collection Time: 06/10/15  [name]:53 PM  Result Value Ref Range Status   MRSA by PCR NEGATIVE NEGATIVE Final    Comment:        The GeneXpert MRSA Assay (FDA approved for NASAL specimens only), is one component of a comprehensive MRSA colonization surveillance program. It is not intended to diagnose MRSA infection nor to guide or monitor treatment for MRSA infections.      Studies:  Recent x-ray studies have been reviewed in detail by the Attending Physician  Scheduled Meds:  Scheduled Meds: . lidocaine  15 mL Mouth/Throat Once  . metoprolol tartrate  12.5 mg Oral BID    Time spent on care of this patient: 40 mins   Danyell Shader, Geraldo Docker , MD  [facility] Office  [phone] Pager - [phone]  On-Call/Text Page:      [email or web address]      password [email or web address]  If 7PM-7AM, please contact night-coverage [email or web address] Password [email or web address] 06/12/2015, 8:58 AM   LOS: 6 days   Care during the described time interval was provided by me .  I have reviewed this patient's available data, including medical history, events of note, physical examination, and all test  results as part of my evaluation. I have personally reviewed and interpreted all radiology studies.   Dia Crawford, MD [phone] Pager

## 2015-06-12 NOTE — Progress Notes (Signed)
Report called to [facility] and given to [place]. Pt will transfer up once she has eaten dinner per patient request.

## 2015-06-13 ENCOUNTER — Inpatient Hospital Stay (HOSPITAL_COMMUNITY): Payer: Medicare Other

## 2015-06-13 LAB — COMPREHENSIVE METABOLIC PANEL
ALT: 11 U/L — ABNORMAL LOW (ref 14–54)
AST: 14 U/L — ABNORMAL LOW (ref 15–41)
Albumin: 2.9 g/dL — ABNORMAL LOW (ref 3.5–5.0)
Alkaline Phosphatase: 69 U/L (ref 38–126)
Anion gap: 6 (ref 5–15)
BUN: <5 mg/dL — ABNORMAL LOW (ref 6–20)
CO2: 28 mmol/L (ref 22–32)
Calcium: 8.5 mg/dL — ABNORMAL LOW (ref 8.9–10.3)
Chloride: 106 mmol/L (ref 101–111)
Creatinine, Ser: 0.58 mg/dL (ref 0.44–1.00)
GFR calc Af Amer: >60 mL/min (ref >60)
GFR calc non Af Amer: >60 mL/min (ref >60)
Glucose, Bld: 91 mg/dL (ref 65–99)
Potassium: 4.2 mmol/L (ref 3.5–5.1)
Sodium: 140 mmol/L (ref 135–145)
Total Bilirubin: 0.8 mg/dL (ref 0.3–1.2)
Total Protein: 5.3 g/dL — ABNORMAL LOW (ref 6.5–8.1)

## 2015-06-13 LAB — MAGNESIUM: Magnesium: 1.6 mg/dL — ABNORMAL LOW (ref 1.7–2.4)

## 2015-06-13 NOTE — Progress Notes (Signed)
4mg  iv morphine given for c/o pain

## 2015-06-13 NOTE — Care Management Note (Signed)
Case Management Note  Patient Details  Name: MEKA ALESSANDRA MRN: [id number] Date of Birth: 09/12/1955  Subjective/Objective:                    Action/Plan:  Referral called and faxed to [facility] with [facility] fax [phone]. Patient lives alone but has PCS through [employer] and daughter checks on her. Patient already has all DME .  Expected Discharge Date:                  Expected Discharge Plan:  Home/Self Care  In-House Referral:     Discharge planning Services  CM Consult  Post Acute Care Choice:    Choice offered to:  Patient  DME Arranged:    DME Agency:     HH Arranged:  PT HH Agency:  [place]  Status of Service:  Completed, signed off  Medicare Important Message Given:  Yes Date Medicare IM Given:    Medicare IM give by:    Date Additional Medicare IM Given:    Additional Medicare Important Message give by:     If discussed at [place] of Stay Meetings, dates discussed:    Additional Comments:  Marilu Favre, RN 06/13/2015, 11:06 AM

## 2015-06-13 NOTE — Progress Notes (Signed)
[facility]                                                                              Patient Demographics  [name], is a 59 y.o. female, DOB - 03-01-1956, [id number]  Admit date - 06/06/2015   Admitting Physician [name], MD  Outpatient Primary MD for the patient is [name], MD  LOS - 7   Chief Complaint  Patient presents with  . Abdominal Pain       Brief HPI   59 y.o. 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  In the ER, CT abdomen and pelvis showed small bowel obstruction with transition point.    Assessment & Plan    Principal Problem: Small bowel obstruction -Gen. surgery following, continue gentle hydration, -Ambulate BID -Tolerating full fluids, diet advanced to soft diet today.    Left Renal lesion - CT of the abdomen showed incidental indeterminate lesion on the left kidney 18 mm mass cyst versus neoplasm - Follow renal ultrasound  Hypokalemia  -goal is 4.0 or >  -to improve GI motility   Essential HTN -Continue metoprolol 12.5 mg BID  Dehydration -Improved, continue gentle hydration  Chronic systolic CHF -last TTE in 2015 with EF 20% -Patient placed on soft diet today, discontinue IV fluids.  Gouty arthropathy/Marfan syndrome -stable   Hyperglycemia -12/15 hemoglobin A1c =5.6  Blindness  Hypomagnesemia -Mag-Ox 400 mg BID  Code Status: Full CODE STATUS  Family Communication: Discussed in detail with the patient, all imaging results, lab results explained to the patient   Disposition Plan: If able to  tolerate solid diet today, DC home in a.m.  Time Spent in minutes 25 minutes  Procedures  None   Consults   gen surgery  DVT Prophylaxis   SCD's  Medications  Scheduled Meds: . lidocaine  15 mL Mouth/Throat Once  . magnesium oxide  400 mg Oral BID  . metoprolol tartrate  12.5 mg Oral BID   Continuous Infusions: . 0.9 % NaCl with KCl 20 mEq / L 50 mL/hr at 06/13/15 0637   PRN Meds:.acetaminophen **OR** acetaminophen, albuterol, fentaNYL (SUBLIMAZE) injection, hydrALAZINE, morphine injection, ondansetron **OR** ondansetron (ZOFRAN) IV, promethazine   Antibiotics   Anti-infectives    None        Subjective:   [name] was seen and examined today.  Feeling better today. Patient denies dizziness, chest pain, shortness of breath, abdominal pain, N/V. Having flatus and stools. Denied new weakness, numbess, tingling. No acute events overnight.    Objective:   Blood pressure 122/79, pulse 79, temperature 98.3 F (36.8 C), temperature source Oral, resp. rate 19, height 5\' 8"  (1.727 m), weight 58.8 kg (129 lb 10.1 oz), last menstrual period 06/25/2006, SpO2 99 %.  Wt Readings from Last 3 Encounters:  06/12/15 58.8  kg (129 lb 10.1 oz)  11/27/14 57.9 kg (127 lb 10.3 oz)  05/11/14 60.918 kg (134 lb 4.8 oz)     Intake/Output Summary (Last 24 hours) at 06/13/15 1257 Last data filed at 06/13/15 0842  Gross per 24 hour  Intake    240 ml  Output   1975 ml  Net  -1735 ml    Exam  General: Alert and oriented x 3, NAD  HEENT:  PERRLA, EOMI, Anicteric Sclera, mucous membranes moist.   Neck: Supple, no JVD, no masses  CVS: S1 S2 auscultated, no rubs, murmurs or gallops. Regular rate and rhythm.  Respiratory: Clear to auscultation bilaterally, no wheezing, rales or rhonchi  Abdomen: Soft, nontender,+ bowel sounds, large ventral hernia soft and reducible, ostomy  Ext: no cyanosis clubbing or edema  Neuro: AAOx3, Cr N's II- XII. Strength 5/5 upper and lower  extremities bilaterally  Skin: No rashes  Psych: Normal affect and demeanor, alert and oriented x3    Data Review   Micro Results Recent Results (from the past 240 hour(s))  MRSA PCR Screening     Status: None   Collection Time: 06/10/15  1:53 PM  Result Value Ref Range Status   MRSA by PCR NEGATIVE NEGATIVE Final    Comment:        The GeneXpert MRSA Assay (FDA approved for NASAL specimens only), is one component of a comprehensive MRSA colonization surveillance program. It is not intended to diagnose MRSA infection nor to guide or monitor treatment for MRSA infections.     Radiology Reports Ct Abdomen Pelvis W Contrast  06/06/2015  CLINICAL DATA:  Pain all over the abdomen. Nausea and vomiting. Colostomy feels plugged up. EXAM: CT ABDOMEN AND PELVIS WITH CONTRAST TECHNIQUE: Multidetector CT imaging of the abdomen and pelvis was performed using the standard protocol following bolus administration of intravenous contrast. CONTRAST:  155mL OMNIPAQUE IOHEXOL 300 MG/ML  SOLN COMPARISON:  11/22/2014 FINDINGS: Atelectasis in the lung bases.  Motion artifact limits evaluation. Multiple low-attenuation lesions in the liver likely representing cysts. Largest measures 2 cm diameter. No change since previous study. Gallbladder is contracted. No bile duct dilatation. Diffuse pancreatic atrophy with mild pancreatic ductal dilatation no change since prior study. The spleen, adrenal glands, inferior vena cava, and retroperitoneal lymph nodes are unremarkable. 18 mm mass in the posterior left kidney demonstrates increased density possibly representing hemorrhagic cyst although enhancing renal cell carcinoma is not excluded. Appearance is similar to previous study. Stomach is unremarkable. There is an anterior abdominal wall hernia containing several loops of small bowel. There is dilatation of fluid-filled small bowel with transition zone in the left lower abdomen at the base of the hernia. Small  bowel loops within the hernia appear dilated. This suggest small bowel obstruction, possibly due to adhesions or hernia. No significant bowel wall thickening. Left lower quadrant ostomy contains contrast material suggesting that this is not a complete obstruction although the relative decompression of the distal bowel suggests moderately high-grade obstruction. No free air or free fluid in the abdomen. Pelvis: Multiple decompressed small bowel loops are present. Bladder wall is not thickened but there is a small diverticulum in the posterior left bladder wall. No intraluminal filling defects. Enhancing lesion in the uterus consistent with a fibroid. No adnexal masses. Small left inguinal hernia containing a small amount of fluid. This is unchanged since prior study. Small amount of free fluid in the pelvis. Prominent degenerative changes and scoliosis of the lumbar spine. Prominent degenerative changes in the  hips. Old healed fracture deformity of the right inferior pubic ramus. IMPRESSION: Small bowel obstruction with transition zone in the mid abdomen possibly due to adhesion or possibly due to anterior abdominal wall hernia which contains dilated small bowel. Contrast material is present in the stoma suggesting that this is not a complete obstruction. However, decompression of distal loops suggest moderately high-grade obstruction is present. Small amount of free fluid in the pelvis. Small left inguinal hernia containing fluid. Indeterminate lesion in the left kidney again demonstrated. Uterine fibroid. Cyst in the liver. Electronically Signed   By: Lucienne Capers M.D.   On: 06/06/2015 23:27   Dg Abd 2 Views  06/10/2015  CLINICAL DATA:  Small-bowel obstruction.  Abdominal pain. EXAM: ABDOMEN - 2 VIEW COMPARISON:  06/08/2015 .  CT 06/06/2015 . FINDINGS: NG tube noted in stable position. No gastric distention. Small-bowel dilatation is improved. Colonic gas pattern is normal. No free air. Aortoiliac  atherosclerotic vascular disease. Prominent lumbar spine scoliosis and degenerative change. Degenerative changes both hips . IMPRESSION: NG tube in stable position . Interim improvement of small bowel distention. Colonic gas pattern is normal. No free air. Electronically Signed   By: Marcello Moores  Register   On: 06/10/2015 08:59   Dg Abd 2 Views  06/08/2015  CLINICAL DATA:  NG tube placed under fluoro for stomach decompression EXAM: ABDOMEN - 2 VIEW; DG NASO G TUBE PLC W/FL-NO RAD FLUOROSCOPY TIME:  Radiation Exposure Index (as provided by the fluoroscopic device): 62.9 d Gy cm2 COMPARISON:  None. FINDINGS: Enteric tube likely terminates in the 1st portion of the duodenum. Mildly prominent loops of small bowel in the central abdomen. Thoracolumbar levoscoliosis. IMPRESSION: Enteric tube likely terminates in the 1st portion of the duodenum. Electronically Signed   By: Julian Hy M.D.   On: 06/08/2015 10:19   Dg Addison Bailey G Tube Plc W/fl-no Rad  06/08/2015  CLINICAL DATA:  NASO G TUBE PLACEMENT WITH FLUORO Fluoroscopy was utilized by the requesting physician.  No radiographic interpretation.    CBC  Recent Labs Lab 06/06/15 2051 06/07/15 0609 06/08/15 0347  WBC 9.4 7.4 4.2  HGB 12.7 11.8* 11.7*  HCT 39.1 37.4 37.2  PLT 254 243 226  MCV 95.6 96.6 97.4  MCH 31.1 30.5 30.6  MCHC 32.5 31.6 31.5  RDW 13.7 14.1 14.2  LYMPHSABS  --  0.9  --   MONOABS  --  0.4  --   EOSABS  --  0.1  --   BASOSABS  --  0.0  --     Chemistries   Recent Labs Lab 06/09/15 0421 06/10/15 0429 06/11/15 0455 06/12/15 0425 06/13/15 0455  NA 144 142 140 139 140  K 4.2 4.1 4.3 4.5 4.2  CL 112* 108 108 108 106  CO2 26 23 24 25 28   GLUCOSE 80 61* 96 100* 91  BUN 11 13 7  <5* <5*  CREATININE 0.65 0.79 0.58 0.48 0.58  CALCIUM 8.1* 8.5* 8.4* 8.5* 8.5*  MG 1.7 1.7 1.7 1.6* 1.6*  AST 16 23 24 20  14*  ALT 11* 17 17 15  11*  ALKPHOS 59 71 72 73 69  BILITOT 0.9 1.3* 1.1 0.8 0.8    ------------------------------------------------------------------------------------------------------------------ estimated creatinine clearance is 70.3 mL/min (by C-G formula based on Cr of 0.58). ------------------------------------------------------------------------------------------------------------------ No results for input(s): HGBA1C in the last 72 hours. ------------------------------------------------------------------------------------------------------------------ No results for input(s): CHOL, HDL, LDLCALC, TRIG, CHOLHDL, LDLDIRECT in the last 72 hours. ------------------------------------------------------------------------------------------------------------------ No results for input(s): TSH, T4TOTAL, T3FREE, THYROIDAB in the last  72 hours.  Invalid input(s): FREET3 ------------------------------------------------------------------------------------------------------------------ No results for input(s): VITAMINB12, FOLATE, FERRITIN, TIBC, IRON, RETICCTPCT in the last 72 hours.  Coagulation profile No results for input(s): INR, PROTIME in the last 168 hours.  No results for input(s): DDIMER in the last 72 hours.  Cardiac Enzymes No results for input(s): CKMB, TROPONINI, MYOGLOBIN in the last 168 hours.  Invalid input(s): CK ------------------------------------------------------------------------------------------------------------------ Invalid input(s): POCBNP  No results for input(s): GLUCAP in the last 72 hours.   Osborn Pullin M.D. [facility] 06/13/2015, 12:57 PM  Pager: [phone] Between 7am to 7pm - call Pager - [phone]  After 7pm go to [email or web address] - password [email or web address]  Call night coverage person covering after 7pm

## 2015-06-13 NOTE — Care Management Important Message (Signed)
Important Message  Patient Details  Name: [name] MRN: [id number] Date of Birth: 12/01/1955   Medicare Important Message Given:  Yes    Zakaree Mcclenahan P [place] 06/13/2015, 3:50 PM

## 2015-06-13 NOTE — Progress Notes (Signed)
Physical Therapy Treatment Patient Details Name: [name] MRN: [id number] DOB: 1956-03-07 Today's Date: 06/13/2015    History of Present Illness pt is 59 y/o female with h/o blindness, Marfan's sydrome, osteoporosis, HTN, substance abuse, bil TKA's, stroke, RA, admitted with abdominal pain due to SBO    PT Comments    Pt is progressing well with her gait distance and is generally about the same assistance level as last session, but some of this is due to her unfamiliar environment.  PT will continue to follow acutely to progress gait and mobility while here in the hospital.   Follow Up Recommendations  Home health PT;Supervision - Intermittent     Equipment Recommendations  None recommended by PT    Recommendations for Other Services   NA     Precautions / Restrictions Precautions Precautions: Fall Precaution Comments: due to RA and blindness.     Mobility  Bed Mobility Overal bed mobility: Modified Independent Bed Mobility: Supine to Sit           General bed mobility comments: Pt able to get EOB with use of bedrail by rolling.   Transfers Overall transfer level: Needs assistance Equipment used: Rolling walker (2 wheeled) Transfers: Sit to/from Stand Sit to Stand: Min guard;From elevated surface         General transfer comment: Min guard assist to stabilize RW and pt for balance.  Bed elevated and BSC elevated  Ambulation/Gait Ambulation/Gait assistance: Min guard Ambulation Distance (Feet): 100 Feet Assistive device: Rolling walker (2 wheeled) Gait Pattern/deviations: Step-through pattern;Shuffle;Trunk flexed Gait velocity: decreased Gait velocity interpretation: Below normal speed for age/gender General Gait Details: Pt generally unsteady on her feet in part due to unfamiliar environment as I was having to manually maneuver her and the RW around obstacles both in her room and in the hallway.            Balance Overall balance assessment:  Needs assistance Sitting-balance support: Feet supported;No upper extremity supported Sitting balance-Leahy Scale: Good     Standing balance support: Bilateral upper extremity supported;No upper extremity supported;Single extremity supported Standing balance-Leahy Scale: Fair                      Cognition Arousal/Alertness: Awake/alert Behavior During Therapy: WFL for tasks assessed/performed Overall Cognitive Status: Within Functional Limits for tasks assessed                             Pertinent Vitals/Pain Pain Assessment: 0-10 Pain Score: 5  Pain Location: abdomen, arthritic joints Pain Descriptors / Indicators: Burning;Aching Pain Intervention(s): Limited activity within patient's tolerance;Monitored during session;Premedicated before session;Repositioned           PT Goals (current goals can now be found in the care plan section) Acute Rehab PT Goals Patient Stated Goal: home when able. Progress towards PT goals: Progressing toward goals    Frequency  Min 3X/week    PT Plan Current plan remains appropriate       End of Session Equipment Utilized During Treatment: Gait belt Activity Tolerance: Patient limited by fatigue Patient left: in chair;with call bell/phone within reach     Time: 1202-1232 PT Time Calculation (min) (ACUTE ONLY): 30 min  Charges:  $Gait Training: 8-22 mins $Therapeutic Activity: 8-22 mins                      [name], [place], DPT [phone]  06/13/2015, 12:45 PM

## 2015-06-13 NOTE — Progress Notes (Signed)
[facility] Progress Note     Subjective: Pt feels much better.  No N/V.  Tolerating fulls.  Wants to advance diet.  Having stools (433mL) and tons of flatus.  Objective: Vital signs in last 24 hours: Temp:  [97.9 F (36.6 C)-98.6 F (37 C)] 98.3 F (36.8 C) (12/19 0604) Pulse Rate:  [79-98] 79 (12/19 0604) Resp:  [15-19] 19 (12/19 0604) BP: (120-144)/(71-91) 122/79 mmHg (12/19 0604) SpO2:  [99 %-100 %] 99 % (12/19 0604) Last BM Date: 06/13/15  Intake/Output from previous day: 12/18 0701 - 12/19 0700 In: -  Out: 2200 [Urine:1725; Stool:475] Intake/Output this shift:    PE: Gen:  Alert, NAD, pleasant Abd: Soft, NT/ND, +BS, no HSM, large ventral hernia soft and reducible, ostomy patent with light brown stools and lots of flatus   Lab Results:  No results for input(s): WBC, HGB, HCT, PLT in the last 72 hours. BMET  Recent Labs  06/12/15 0425 06/13/15 0455  NA 139 140  K 4.5 4.2  CL 108 106  CO2 25 28  GLUCOSE 100* 91  BUN <5* <5*  CREATININE 0.48 0.58  CALCIUM 8.5* 8.5*   PT/INR No results for input(s): LABPROT, INR in the last 72 hours. CMP     Component Value Date/Time   NA 140 06/13/2015 0455   K 4.2 06/13/2015 0455   CL 106 06/13/2015 0455   CO2 28 06/13/2015 0455   GLUCOSE 91 06/13/2015 0455   BUN <5* 06/13/2015 0455   CREATININE 0.58 06/13/2015 0455   CALCIUM 8.5* 06/13/2015 0455   PROT 5.3* 06/13/2015 0455   ALBUMIN 2.9* 06/13/2015 0455   AST 14* 06/13/2015 0455   ALT 11* 06/13/2015 0455   ALKPHOS 69 06/13/2015 0455   BILITOT 0.8 06/13/2015 0455   GFRNONAA >60 06/13/2015 0455   GFRAA >60 06/13/2015 0455   Lipase     Component Value Date/Time   LIPASE 40 06/06/2015 2051       Studies/Results: No results found.  Anti-infectives: Anti-infectives    None       Assessment/Plan Recurrent SBO, or PSBO  -Advance to soft diet -Mobilize to aid in recovery as able -Good hydration -Bowel regimen -May need to eat  smaller more frequent meals -Okay to d/c home when medically stable, no follow up needed with [place]  S/p exploratory laparotomy, sigmoid colectomy/colostomy 11/27/12, Lysis of adhesions/enterotomy rpeair 01/06/13, Dr. 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  Bilateral knee replacements Hypertension Gout Hypokalemia -resolved Hypomagnesemia Adding mag oxide for 2 days. Atrial flutter  DVT: SCD/she can have heparin or Lovenox for DVT prophylaxis from our standpoint Antibiotics: None    LOS: 7 days    Nat Christen 06/13/2015, 8:30 AM Pager: [phone]

## 2015-06-14 LAB — COMPREHENSIVE METABOLIC PANEL
ALT: 12 U/L — ABNORMAL LOW (ref 14–54)
AST: 14 U/L — ABNORMAL LOW (ref 15–41)
Albumin: 2.8 g/dL — ABNORMAL LOW (ref 3.5–5.0)
Alkaline Phosphatase: 69 U/L (ref 38–126)
Anion gap: 7 (ref 5–15)
BUN: <5 mg/dL — ABNORMAL LOW (ref 6–20)
CO2: 27 mmol/L (ref 22–32)
Calcium: 8.8 mg/dL — ABNORMAL LOW (ref 8.9–10.3)
Chloride: 107 mmol/L (ref 101–111)
Creatinine, Ser: 0.56 mg/dL (ref 0.44–1.00)
GFR calc Af Amer: >60 mL/min (ref >60)
GFR calc non Af Amer: >60 mL/min (ref >60)
Glucose, Bld: 90 mg/dL (ref 65–99)
Potassium: 4.3 mmol/L (ref 3.5–5.1)
Sodium: 141 mmol/L (ref 135–145)
Total Bilirubin: 0.6 mg/dL (ref 0.3–1.2)
Total Protein: 5.2 g/dL — ABNORMAL LOW (ref 6.5–8.1)

## 2015-06-14 LAB — MAGNESIUM: Magnesium: 1.8 mg/dL (ref 1.7–2.4)

## 2015-06-14 MED ORDER — PROMETHAZINE HCL 25 MG PO TABS: 25.0000 mg | ORAL_TABLET | Freq: Four times a day (QID) | ORAL | Status: DC | PRN

## 2015-06-14 MED ORDER — TRAMADOL HCL 50 MG PO TABS: ORAL_TABLET | ORAL | Status: DC

## 2015-06-14 NOTE — Care Management Note (Addendum)
Case Management Note  Patient Details  Name: [name] MRN: [id number] Date of Birth: 1955-07-21  Subjective/Objective:                    Action/Plan:  Faxed updated order and face to face to [facility] at [place] . Spoke with Malachy Mood via phone , Malachy Mood aware patient discharging to home today . Will fax discharge summary once completed.  DC summary faxed  Expected Discharge Date:                  Expected Discharge Plan:  [place]  In-House Referral:     Discharge planning Services  CM Consult  Post Acute Care Choice:  Home Health Choice offered to:  Patient  DME Arranged:    DME Agency:     HH Arranged:  PT, OT, RN, NA HH Agency:  [place]  Status of Service:  Completed, signed off  Medicare Important Message Given:  Yes Date Medicare IM Given:    Medicare IM give by:    Date Additional Medicare IM Given:    Additional Medicare Important Message give by:     If discussed at [place] of Stay Meetings, dates discussed:    Additional Comments:  Marilu Favre, RN 06/14/2015, 10:00 AM

## 2015-06-14 NOTE — Progress Notes (Signed)
[facility] Progress Note     Subjective: Doing well on soft diet.  She is pending discharge home per primary service.  No complaints.  Ostomy with some stool and lots of flatus.    Objective: Vital signs in last 24 hours: Temp:  [98.2 F (36.8 C)-98.4 F (36.9 C)] 98.3 F (36.8 C) (12/20 0645) Pulse Rate:  [65-77] 71 (12/20 0645) Resp:  [16-17] 17 (12/20 0645) BP: (111-114)/(64-71) 113/71 mmHg (12/20 0645) SpO2:  [99 %-100 %] 99 % (12/20 0645) Weight:  [57.4 kg (126 lb 8.7 oz)] 57.4 kg (126 lb 8.7 oz) (12/20 0645) Last BM Date: 06/13/15  Intake/Output from previous day: 12/19 0701 - 12/20 0700 In: 480 [P.O.:480] Out: 800 [Urine:800] Intake/Output this shift:    PE: Gen:  Alert, NAD, pleasant Abd: Soft, NT/ND, +BS, no HSM, large ventral hernia soft and reducible, ostomy patent with light brown stools and lots of flatus   Lab Results:  No results for input(s): WBC, HGB, HCT, PLT in the last 72 hours. BMET  Recent Labs  06/13/15 0455 06/14/15 0445  NA 140 141  K 4.2 4.3  CL 106 107  CO2 28 27  GLUCOSE 91 90  BUN <5* <5*  CREATININE 0.58 0.56  CALCIUM 8.5* 8.8*   PT/INR No results for input(s): LABPROT, INR in the last 72 hours. CMP     Component Value Date/Time   NA 141 06/14/2015 0445   K 4.3 06/14/2015 0445   CL 107 06/14/2015 0445   CO2 27 06/14/2015 0445   GLUCOSE 90 06/14/2015 0445   BUN <5* 06/14/2015 0445   CREATININE 0.56 06/14/2015 0445   CALCIUM 8.8* 06/14/2015 0445   PROT 5.2* 06/14/2015 0445   ALBUMIN 2.8* 06/14/2015 0445   AST 14* 06/14/2015 0445   ALT 12* 06/14/2015 0445   ALKPHOS 69 06/14/2015 0445   BILITOT 0.6 06/14/2015 0445   GFRNONAA >60 06/14/2015 0445   GFRAA >60 06/14/2015 0445   Lipase     Component Value Date/Time   LIPASE 40 06/06/2015 2051       Studies/Results: US Renal  06/13/2015  CLINICAL DATA:  Indeterminate lesion left kidney. EXAM: RENAL / URINARY TRACT ULTRASOUND COMPLETE COMPARISON:  CT  06/06/2015 FINDINGS: Right Kidney: Length: 10.4 cm. Echogenicity within normal limits. No mass or hydronephrosis visualized. Left Kidney: Length: 11.0 cm. Lesion off the upper pole of the left kidney is solid, measuring 1.9 x 1.8 x 1.9 cm, corresponding to the abnormality seen on prior CT. No hydronephrosis. Bladder: Appears normal for degree of bladder distention. IMPRESSION: 19 mm lesion off the upper pole of the left kidney as seen on prior CT appears to be solid sonographically. Cannot exclude renal neoplasm. This could be further evaluated with MRI with and without contrast. Electronically Signed   By: Rolm Baptise M.D.   On: 06/13/2015 15:40    Anti-infectives: Anti-infectives    None       Assessment/Plan Recurrent pSBO -Cont soft diet at discharge -Mobilize to aid in recovery as able -Good hydration -Bowel regimen -May need to eat smaller more frequent meals -Okay to d/c home when medically stable, no follow up needed with [place] -Will sign off  S/p exploratory laparotomy, sigmoid colectomy/colostomy 11/27/12, Lysis of adhesions/enterotomy rpeair 01/06/13, Dr. 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  Bilateral knee  replacements Hypertension Gout Hypokalemia -resolved Hypomagnesemia Adding mag oxide for 2 days. Atrial flutter  DVT: SCD/she can have heparin or Lovenox for DVT prophylaxis from our standpoint Antibiotics: None    LOS: 8 days    Nat Christen 06/14/2015, 9:40 AM Pager: [phone]

## 2015-06-14 NOTE — Care Management Note (Signed)
Case Management Note  Patient Details  Name: MIALYN DIFRANCESCO MRN: [id number] Date of Birth: January 29, 1956  Subjective/Objective:                    Action/Plan:  Malachy Mood with [facility] called she is unable to take referral due to insurance .   Explained to patient . She would like [place] . Bethena Roys with [name] accepted referral.  Patient aware  Expected Discharge Date:                  Expected Discharge Plan:  [place]  In-House Referral:     Discharge planning Services  CM Consult  Post Acute Care Choice:  Home Health Choice offered to:  Patient  DME Arranged:    DME Agency:     HH Arranged:  PT, OT, RN, NA HH Agency:  [place]  Status of Service:  Completed, signed off  Medicare Important Message Given:  Yes Date Medicare IM Given:    Medicare IM give by:    Date Additional Medicare IM Given:    Additional Medicare Important Message give by:     If discussed at [place] of Stay Meetings, dates discussed:    Additional Comments:  Marilu Favre, RN 06/14/2015, 1:43 PM

## 2015-06-14 NOTE — Discharge Summary (Signed)
Physician Discharge Summary   Patient ID: [name] MRN: [id number] DOB/AGE: 12-29-1955 59 y.o.  Admit date: 06/06/2015 Discharge date: 06/14/2015  Primary Care Physician:  Christina Morale, MD  Discharge Diagnoses:    . SBO (small bowel obstruction) (HCC)    Left renal mass     Hypokalemia  . Rheumatoid arthritis ([place]) . Pulmonary HTN ([place]) . Gouty arthropathy . Essential hypertension . Blindness . Chronic systolic CHF (congestive heart failure) (HCC)   Consults: Gen. surgery  Recommendations for Outpatient Follow-up:  1. Home health PT, OT, RN, HHA arranged 2. Patient post that she has been following with urology, Dr [name] for the known left renal lesion. I call the office and also scheduled appointment for the follow-up on 07/29/15 3. Oxycodone discontinued    DIET: Soft diet    Allergies:  No Known Allergies   DISCHARGE MEDICATIONS: Current Discharge Medication List    CONTINUE these medications which have CHANGED   Details  promethazine (PHENERGAN) 25 MG tablet Take 1 tablet (25 mg total) by mouth every 6 (six) hours as needed for nausea or vomiting. Qty: 30 tablet, Refills: 0    traMADol (ULTRAM) 50 MG tablet take 1-2 tablets by mouth every 6 hours if needed Qty: 60 tablet, Refills: 0      CONTINUE these medications which have NOT CHANGED   Details  Abatacept (ORENCIA) 125 MG/ML SOSY Inject 1 application into the skin once a week.    albuterol (PROVENTIL HFA;VENTOLIN HFA) 108 (90 BASE) MCG/ACT inhaler Inhale 1 puff into the lungs every 4 (four) hours as needed for wheezing or shortness of breath. Qty: 1 Inhaler, Refills: 11    allopurinol (ZYLOPRIM) 100 MG tablet take 1 tablet by mouth once daily Qty: 30 tablet, Refills: 6    amLODipine (NORVASC) 10 MG tablet take 1 tablet by mouth once daily Qty: 30 tablet, Refills: 6    aspirin EC 81 MG tablet Take 1 tablet (81 mg total) by mouth daily.    Calcium Carbonate-Vitamin D (CALCIUM + D PO)  Take 1,500 mg by mouth daily.     cetirizine (ZYRTEC) 10 MG tablet Take 1 tablet (10 mg total) by mouth daily. Qty: 90 tablet, Refills: 1    Cyanocobalamin (VITAMIN B 12 PO) Take 1 tablet by mouth daily.    docusate sodium (COLACE) 100 MG capsule Take 100 mg by mouth 2 (two) times daily.    folic acid (FOLVITE) 1 MG tablet Take 1 mg by mouth daily.    ketoconazole (NIZORAL) 2 % cream Apply 1 application topically 2 (two) times daily. Qty: 30 g, Refills: 5    methotrexate 25 MG/ML injection Inject 25 mg into the skin once a week. Take on Mondays    Multiple Vitamins-Minerals (MULTIVITAMIN PO) Take 1 tablet by mouth daily.     NEXIUM 40 MG capsule take 1 capsule by mouth twice a day Qty: 60 capsule, Refills: 11    polyethylene glycol (MIRALAX / GLYCOLAX) packet Take 17 g by mouth daily. Qty: 14 each, Refills: 0    risedronate (ACTONEL) 150 MG tablet Take 150 mg by mouth every 30 (thirty) days. with water on empty stomach, nothing by mouth or lie down for next 30 minutes.    tiZANidine (ZANAFLEX) 4 MG tablet take 1 tablet by mouth every 6 hours if needed for muscle spasm Qty: 90 tablet, Refills: 1    vitamin C (ASCORBIC ACID) 500 MG tablet Take 500 mg by mouth daily.  STOP taking these medications     HYDROcodone-acetaminophen (NORCO) 10-325 MG tablet      nitrofurantoin, macrocrystal-monohydrate, (MACROBID) 100 MG capsule          Brief H and P: For complete details please refer to admission H and P, but in brief59 y.o. 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  In the ER, CT  abdomen and pelvis showed small bowel obstruction with transition point.  Hospital Course:  Small bowel obstruction- recurrent  Patient had presented with complaints of progressively worsening epigastric pain, nausea, vomiting. CT of the abdomen and pelvis showed small bowel obstruction with transition point. General surgery was consulted. Patient was placed on NPO status with NG tube to intermittent wall suction. Patient improved conservatively with nonoperative management. She is now tolerating soft solid diet. Per general surgery, cleared to DC home, does not need any follow-up with them. Discontinued Norco and patient placed on bowel regimen.   Left Renal lesion - CT of the abdomen showed incidental indeterminate lesion on the left kidney 18 mm mass cyst versus neoplasm. Renal ultrasound showed 19 mm lesion in the upper pole of the left kidney. Patient already had an MRI of the abdomen in January 2016 which had shown small enhancing lesion extending medially from the posterior cortex of the left kidney concerning for small renal cell CA. Discussed in detail with the patient who reported that she has been following urology, Dr [name]. I also called the office and made a follow-up appointment for the patient on 07/29/15 with Dr [name].    Hypokalemia  - Resolved  Essential HTN -Continue Norvasc   Dehydration: Resolved with gentle hydration  Chronic systolic CHF -last TTE in 2015 with EF 20%, currently compensated  Gouty arthropathy/Marfan syndrome -stable   Hyperglycemia -12/15 hemoglobin A1c =5.6  Blindness  Hypomagnesemia -Mag-Ox 400 mg BID    Day of Discharge BP 113/71 mmHg  Pulse 71  Temp(Src) 98.3 F (36.8 C) (Oral)  Resp 17  Ht 5\' 8"  (1.727 m)  Wt 57.4 kg (126 lb 8.7 oz)  BMI 19.25 kg/m2  SpO2 99%  LMP 06/25/2006 (LMP Unknown)  Physical Exam: General: Alert and awake oriented x3 not in any acute distress. HEENT: anicteric sclera, pupils reactive to  light and accommodation CVS: S1-S2 clear no murmur rubs or gallops Chest: clear to auscultation bilaterally, no wheezing rales or rhonchi Abdomen: soft nontender, nondistended, normal bowel sounds Extremities: no cyanosis, clubbing or edema noted bilaterally Neuro: Cranial nerves II-XII intact, no focal neurological deficits   The results of significant diagnostics from this hospitalization (including imaging, microbiology, ancillary and laboratory) are listed below for reference.    LAB RESULTS: Basic Metabolic Panel:  Recent Labs Lab 06/13/15 0455 06/14/15 0445  NA 140 141  K 4.2 4.3  CL 106 107  CO2 28 27  GLUCOSE 91 90  BUN <5* <5*  CREATININE 0.58 0.56  CALCIUM 8.5* 8.8*  MG 1.6* 1.8   Liver Function Tests:  Recent Labs Lab 06/13/15 0455 06/14/15 0445  AST 14* 14*  ALT 11* 12*  ALKPHOS 69 69  BILITOT 0.8 0.6  PROT 5.3* 5.2*  ALBUMIN 2.9* 2.8*  No results for input(s): LIPASE, AMYLASE in the last 168 hours. No results for input(s): AMMONIA in the last 168 hours. CBC:  Recent Labs Lab 06/08/15 0347  WBC 4.2  HGB 11.7*  HCT 37.2  MCV 97.4  PLT 226   Cardiac Enzymes: No results for input(s): CKTOTAL, CKMB, CKMBINDEX, TROPONINI in the last 168 hours. BNP: Invalid input(s): POCBNP CBG:  Recent Labs Lab 06/09/15 0351 06/09/15 1446  GLUCAP 76 56*    Significant Diagnostic Studies:  Ct Abdomen Pelvis W Contrast  06/06/2015  CLINICAL DATA:  Pain all over the abdomen. Nausea and vomiting. Colostomy feels plugged up. EXAM: CT ABDOMEN AND PELVIS WITH CONTRAST TECHNIQUE: Multidetector CT imaging of the abdomen and pelvis was performed using the standard protocol following bolus administration of intravenous contrast. CONTRAST:  177mL OMNIPAQUE IOHEXOL 300 MG/ML  SOLN COMPARISON:  11/22/2014 FINDINGS: Atelectasis in the lung bases.  Motion artifact limits evaluation. Multiple low-attenuation lesions in the liver likely representing cysts. Largest measures 2  cm diameter. No change since previous study. Gallbladder is contracted. No bile duct dilatation. Diffuse pancreatic atrophy with mild pancreatic ductal dilatation no change since prior study. The spleen, adrenal glands, inferior vena cava, and retroperitoneal lymph nodes are unremarkable. 18 mm mass in the posterior left kidney demonstrates increased density possibly representing hemorrhagic cyst although enhancing renal cell carcinoma is not excluded. Appearance is similar to previous study. Stomach is unremarkable. There is an anterior abdominal wall hernia containing several loops of small bowel. There is dilatation of fluid-filled small bowel with transition zone in the left lower abdomen at the base of the hernia. Small bowel loops within the hernia appear dilated. This suggest small bowel obstruction, possibly due to adhesions or hernia. No significant bowel wall thickening. Left lower quadrant ostomy contains contrast material suggesting that this is not a complete obstruction although the relative decompression of the distal bowel suggests moderately high-grade obstruction. No free air or free fluid in the abdomen. Pelvis: Multiple decompressed small bowel loops are present. Bladder wall is not thickened but there is a small diverticulum in the posterior left bladder wall. No intraluminal filling defects. Enhancing lesion in the uterus consistent with a fibroid. No adnexal masses. Small left inguinal hernia containing a small amount of fluid. This is unchanged since prior study. Small amount of free fluid in the pelvis. Prominent degenerative changes and scoliosis of the lumbar spine. Prominent degenerative changes in the hips. Old healed fracture deformity of the right inferior pubic ramus. IMPRESSION: Small bowel obstruction with transition zone in the mid abdomen possibly due to adhesion or possibly due to anterior abdominal wall hernia which contains dilated small bowel. Contrast material is present in  the stoma suggesting that this is not a complete obstruction. However, decompression of distal loops suggest moderately high-grade obstruction is present. Small amount of free fluid in the pelvis. Small left inguinal hernia containing fluid. Indeterminate lesion in the left kidney again demonstrated. Uterine fibroid. Cyst in the liver. Electronically Signed   By: Lucienne Capers M.D.   On: 06/06/2015 23:27    2D ECHO:   Disposition and Follow-up: Discharge Instructions    Discharge instructions    Complete by:  As directed   SOFT DIET     Increase activity slowly    Complete by:  As directed             DISPOSITION:Home with home health  DISCHARGE FOLLOW-UP Follow-up Information    Follow up with DAHLSTEDT, Lillette Boxer, MD On  07/29/2015.   Specialty:  Urology   Why:  at 10:15 AM, for hospital follow-up   Contact information:   [place] [address] [phone]       Follow up with [name] A, MD. Schedule an appointment as soon as possible for a visit on 06/28/2015.   Specialty:  Family Medicine   Why:  for hospital follow-up   Contact information:   [place] [address] [phone]        Time spent on Discharge: 35 minutes  Signed:   RAI,RIPUDEEP M.D. [facility] 06/14/2015, 9:47 AM Pager: [phone]

## 2015-06-14 NOTE — Discharge Planning (Signed)
Patient discharged home in stable condition. Verbalizes understanding of all discharge instructions, including home medications and follow up appointments. 

## 2015-06-15 DIAGNOSIS — I5022 Chronic systolic (congestive) heart failure: Secondary | ICD-10-CM | POA: Diagnosis not present

## 2015-06-15 DIAGNOSIS — Z433 Encounter for attention to colostomy: Secondary | ICD-10-CM | POA: Diagnosis not present

## 2015-06-15 DIAGNOSIS — I110 Hypertensive heart disease with heart failure: Secondary | ICD-10-CM | POA: Diagnosis not present

## 2015-06-15 DIAGNOSIS — K5669 Other intestinal obstruction: Secondary | ICD-10-CM | POA: Diagnosis not present

## 2015-06-15 DIAGNOSIS — M1000 Idiopathic gout, unspecified site: Secondary | ICD-10-CM | POA: Diagnosis not present

## 2015-06-16 ENCOUNTER — Telehealth: Payer: Self-pay | Admitting: Family Medicine

## 2015-06-16 ENCOUNTER — Telehealth: Payer: Self-pay | Admitting: *Deleted

## 2015-06-16 DIAGNOSIS — I110 Hypertensive heart disease with heart failure: Secondary | ICD-10-CM | POA: Diagnosis not present

## 2015-06-16 DIAGNOSIS — Z433 Encounter for attention to colostomy: Secondary | ICD-10-CM | POA: Diagnosis not present

## 2015-06-16 DIAGNOSIS — I5022 Chronic systolic (congestive) heart failure: Secondary | ICD-10-CM | POA: Diagnosis not present

## 2015-06-16 DIAGNOSIS — K5669 Other intestinal obstruction: Secondary | ICD-10-CM | POA: Diagnosis not present

## 2015-06-16 DIAGNOSIS — M1000 Idiopathic gout, unspecified site: Secondary | ICD-10-CM | POA: Diagnosis not present

## 2015-06-16 NOTE — Telephone Encounter (Signed)
Transitional Care Management. 1st attempt to call patient.  Left message on machine for patient to return my call.

## 2015-06-16 NOTE — Telephone Encounter (Signed)
Ana from [facility] called with discharge information on the pt. The pt was discharged from [facility] on 12.20.16. She was there due to "other intestinal obstruction."   Ana's ph# [phone] ext [phone] Thank you.

## 2015-06-17 NOTE — Telephone Encounter (Signed)
I spoke with [name] and she is trying to reach pt by phone to schedule a hospital follow up.

## 2015-06-20 ENCOUNTER — Encounter (HOSPITAL_COMMUNITY): Payer: Self-pay | Admitting: Emergency Medicine

## 2015-06-20 ENCOUNTER — Emergency Department (HOSPITAL_COMMUNITY): Payer: Medicare Other

## 2015-06-20 ENCOUNTER — Emergency Department (HOSPITAL_COMMUNITY)
Admission: EM | Admit: 2015-06-20 | Discharge: 2015-06-20 | Disposition: A | Discharge status: Home / Self Care | Payer: Medicare Other | Attending: Emergency Medicine | Admitting: Emergency Medicine

## 2015-06-20 DIAGNOSIS — H548 Legal blindness, as defined in USA: Secondary | ICD-10-CM | POA: Insufficient documentation

## 2015-06-20 DIAGNOSIS — Z79899 Other long term (current) drug therapy: Secondary | ICD-10-CM | POA: Insufficient documentation

## 2015-06-20 DIAGNOSIS — I10 Essential (primary) hypertension: Secondary | ICD-10-CM | POA: Insufficient documentation

## 2015-06-20 DIAGNOSIS — K469 Unspecified abdominal hernia without obstruction or gangrene: Secondary | ICD-10-CM | POA: Diagnosis not present

## 2015-06-20 DIAGNOSIS — Z8673 Personal history of transient ischemic attack (TIA), and cerebral infarction without residual deficits: Secondary | ICD-10-CM | POA: Insufficient documentation

## 2015-06-20 DIAGNOSIS — Z96653 Presence of artificial knee joint, bilateral: Secondary | ICD-10-CM | POA: Diagnosis not present

## 2015-06-20 DIAGNOSIS — R000 Tachycardia, unspecified: Secondary | ICD-10-CM | POA: Insufficient documentation

## 2015-06-20 DIAGNOSIS — Z974 Presence of external hearing-aid: Secondary | ICD-10-CM | POA: Diagnosis not present

## 2015-06-20 DIAGNOSIS — R112 Nausea with vomiting, unspecified: Secondary | ICD-10-CM

## 2015-06-20 DIAGNOSIS — Z931 Gastrostomy status: Secondary | ICD-10-CM | POA: Diagnosis not present

## 2015-06-20 DIAGNOSIS — M069 Rheumatoid arthritis, unspecified: Secondary | ICD-10-CM | POA: Diagnosis not present

## 2015-06-20 DIAGNOSIS — Z7982 Long term (current) use of aspirin: Secondary | ICD-10-CM | POA: Insufficient documentation

## 2015-06-20 DIAGNOSIS — R402421 Glasgow coma scale score 9-12, in the field [EMT or ambulance]: Secondary | ICD-10-CM | POA: Diagnosis not present

## 2015-06-20 DIAGNOSIS — Z86018 Personal history of other benign neoplasm: Secondary | ICD-10-CM | POA: Insufficient documentation

## 2015-06-20 DIAGNOSIS — M810 Age-related osteoporosis without current pathological fracture: Secondary | ICD-10-CM | POA: Insufficient documentation

## 2015-06-20 DIAGNOSIS — Z87891 Personal history of nicotine dependence: Secondary | ICD-10-CM | POA: Insufficient documentation

## 2015-06-20 DIAGNOSIS — R1110 Vomiting, unspecified: Secondary | ICD-10-CM | POA: Diagnosis not present

## 2015-06-20 DIAGNOSIS — M109 Gout, unspecified: Secondary | ICD-10-CM | POA: Diagnosis not present

## 2015-06-20 LAB — CBC WITH DIFFERENTIAL/PLATELET
Basophils Absolute: 0 10*3/uL (ref 0.0–0.1)
Basophils Relative: 0 %
Eosinophils Absolute: 0.1 10*3/uL (ref 0.0–0.7)
Eosinophils Relative: 1 %
HCT: 39.9 % (ref 36.0–46.0)
Hemoglobin: 12.6 g/dL (ref 12.0–15.0)
Lymphocytes Relative: 5 %
Lymphs Abs: 1 10*3/uL (ref 0.7–4.0)
MCH: 30.7 pg (ref 26.0–34.0)
MCHC: 31.6 g/dL (ref 30.0–36.0)
MCV: 97.1 fL (ref 78.0–100.0)
Monocytes Absolute: 1.1 10*3/uL — ABNORMAL HIGH (ref 0.1–1.0)
Monocytes Relative: 6 %
Neutro Abs: 17.2 10*3/uL — ABNORMAL HIGH (ref 1.7–7.7)
Neutrophils Relative %: 88 %
Platelets: 356 10*3/uL (ref 150–400)
RBC: 4.11 MIL/uL (ref 3.87–5.11)
RDW: 14.1 % (ref 11.5–15.5)
WBC: 19.5 10*3/uL — ABNORMAL HIGH (ref 4.0–10.5)

## 2015-06-20 LAB — COMPREHENSIVE METABOLIC PANEL
ALT: 16 U/L (ref 14–54)
AST: 23 U/L (ref 15–41)
Albumin: 3.9 g/dL (ref 3.5–5.0)
Alkaline Phosphatase: 82 U/L (ref 38–126)
Anion gap: 11 (ref 5–15)
BUN: 21 mg/dL — ABNORMAL HIGH (ref 6–20)
CO2: 25 mmol/L (ref 22–32)
Calcium: 9.2 mg/dL (ref 8.9–10.3)
Chloride: 107 mmol/L (ref 101–111)
Creatinine, Ser: 0.91 mg/dL (ref 0.44–1.00)
GFR calc Af Amer: >60 mL/min (ref >60)
GFR calc non Af Amer: >60 mL/min (ref >60)
Glucose, Bld: 108 mg/dL — ABNORMAL HIGH (ref 65–99)
Potassium: 3.5 mmol/L (ref 3.5–5.1)
Sodium: 143 mmol/L (ref 135–145)
Total Bilirubin: 0.8 mg/dL (ref 0.3–1.2)
Total Protein: 7.2 g/dL (ref 6.5–8.1)

## 2015-06-20 LAB — LIPASE, BLOOD: Lipase: 40 U/L (ref 11–51)

## 2015-06-20 MED ORDER — IOHEXOL 300 MG/ML  SOLN
100.0000 mL | Freq: Once | INTRAMUSCULAR | Status: AC | PRN
  Administered 2015-06-20: 100 mL via INTRAVENOUS

## 2015-06-20 MED ORDER — SODIUM CHLORIDE 0.9 % IV BOLUS (SEPSIS)
1000.0000 mL | Freq: Once | INTRAVENOUS | Status: AC
  Administered 2015-06-20: 1000 mL via INTRAVENOUS

## 2015-06-20 MED ORDER — ONDANSETRON HCL 4 MG PO TABS: 4.0000 mg | ORAL_TABLET | Freq: Three times a day (TID) | ORAL | Status: DC | PRN

## 2015-06-20 NOTE — ED Notes (Signed)
  Pt transported to ct 

## 2015-06-20 NOTE — ED Provider Notes (Signed)
CSN: [id number]     Arrival date & time 06/20/15  0534 History   First MD Initiated Contact with Patient 06/20/15 0602     Chief Complaint  Patient presents with  . Emesis     (Consider location/radiation/quality/duration/timing/severity/associated sxs/prior Treatment) HPI   59 year old female with history of diverticulitis, recurrent small bowel obstruction due to adhesion, Marfan's syndrome, who is legally blind who was brought here via EMS from home for evaluation of nausea and vomiting. The patient was discharged from the hospital 6 days ago after being diagnosed with SBO.  Patient states she has been doing well since her discharge. She has a hearty meal last night for Christmas. This morning at 3 AM she began expressing nausea and has had persistent vomiting every 15 minutes. She is blind therefore unable to describe her vomit content. He denies having any associated abdominal pain. No fever, chest pain, shortness of breath, productive cough, dysuria. She is able to pass flatus. Since EMS arrived symptom has abated and she is currently resting comfortably.  Past Medical History  Diagnosis Date  . Legally blind     since pt was a teenager  . Hearing aid worn     pt wears bilateral hearing aids  . Marfan's syndrome affecting skin     with scolosis  . Osteoporosis   . Hypertension   . 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 Hernia 4/08  . 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 Chronic gout 2008  . Stroke ([place]) 2/08  . Fibroid   . Status post bunionectomy 1/10    bilateral   . Diverticulitis of large intestine with perforation   . Small bowel obstruction due to adhesions ([place])   . Rheumatoid arthritis [facility])     sees Dr. Gavin Pound   . SBO (small bowel obstruction) ([place]) 01/05/2013  . Atrial flutter [facility])    Past Surgical History  Procedure Laterality Date  . Bunionectomy Bilateral 1/10  . Total knee arthroplasty Bilateral 6/08  . Hernia  repair    . Laparotomy N/A 11/27/2012    Procedure: EXPLORATORY LAPAROTOMY SIGMOID COLECTOMY, COLOSTOMY;  Surgeon: Madilyn Hook, DO;  Location: [facility] ORS;  Service: General;  Laterality: N/A;  . Colostomy  11/27/2012  . Laparoscopic abdominal exploration N/A 01/06/2013    Procedure: LAPAROSCOPIC ABDOMINAL EXPLORATION;  Surgeon: Adin Hector, MD;  Location: [facility] ORS;  Service: General;  Laterality: N/A;  . Laparoscopic lysis of adhesions N/A 01/06/2013    Procedure: LAPAROSCOPIC LYSIS OF ADHESIONS/ INTEROTOMY REPAIR;  Surgeon: Adin Hector, MD;  Location: [facility] ORS;  Service: General;  Laterality: N/A;  . Colon surgery      colectomy  . Ablation  01/04/14    atrial flutter ablation (2 circuits) by Dr Lovena Le  . Atrial flutter ablation N/A 01/04/2014    Procedure: ATRIAL FLUTTER ABLATION;  Surgeon: Evans Lance, MD;  Location: [facility] CATH LAB;  Service: Cardiovascular;  Laterality: N/A;   Family History  Problem Relation Age of Onset  . Heart attack Neg Hx    Social History  Substance Use Topics  . Smoking status: Former Smoker -- 1.00 packs/day    Types: Cigarettes    Quit date: 11/27/2012  . Smokeless tobacco: Never Used  . Alcohol Use: No     Comment: recovering alcoholic sober since 0000000   OB History    Gravida Para Term Preterm AB TAB SAB Ectopic Multiple Living  1 1 1       1      Review of Systems  All other systems reviewed and are negative.     Allergies  Review of patient's allergies indicates no known allergies.  Home Medications   Prior to Admission medications   Medication Sig Start Date End Date Taking? Authorizing Provider  Abatacept (ORENCIA) 125 MG/ML SOSY Inject 1 application into the skin once a week.    Historical Provider, MD  albuterol (PROVENTIL HFA;VENTOLIN HFA) 108 (90 BASE) MCG/ACT inhaler Inhale 1 puff into the lungs every 4 (four) hours as needed for wheezing or shortness of breath. 12/29/13   [name], MD  allopurinol (ZYLOPRIM) 100 MG tablet take 1  tablet by mouth once daily 02/25/15   [name], MD  amLODipine (NORVASC) 10 MG tablet take 1 tablet by mouth once daily 02/25/15   [name], MD  aspirin EC 81 MG tablet Take 1 tablet (81 mg total) by mouth daily. 05/11/14   Verlee Monte, MD  Calcium Carbonate-Vitamin D (CALCIUM + D PO) Take 1,500 mg by mouth daily.     Historical Provider, MD  cetirizine (ZYRTEC) 10 MG tablet Take 1 tablet (10 mg total) by mouth daily. 11/17/14   [name], MD  Cyanocobalamin (VITAMIN B 12 PO) Take 1 tablet by mouth daily.    Historical Provider, MD  docusate sodium (COLACE) 100 MG capsule Take 100 mg by mouth 2 (two) times daily.    Historical Provider, MD  folic acid (FOLVITE) 1 MG tablet Take 1 mg by mouth daily.    Historical Provider, MD  ketoconazole (NIZORAL) 2 % cream Apply 1 application topically 2 (two) times daily. Patient taking differently: Apply 1 application topically 2 (two) times daily as needed for irritation.  08/10/13   [name], MD  methotrexate 25 MG/ML injection Inject 25 mg into the skin once a week. Take on Mondays    Historical Provider, MD  Multiple Vitamins-Minerals (MULTIVITAMIN PO) Take 1 tablet by mouth daily.     Historical Provider, MD  NEXIUM 40 MG capsule take 1 capsule by mouth twice a day 08/31/14   [name], MD  polyethylene glycol (MIRALAX / GLYCOLAX) packet Take 17 g by mouth daily. Patient taking differently: Take 17 g by mouth daily as needed for mild constipation.  05/11/14   Verlee Monte, MD  promethazine (PHENERGAN) 25 MG tablet Take 1 tablet (25 mg total) by mouth every 6 (six) hours as needed for nausea or vomiting. 06/14/15   Ripudeep Krystal Eaton, MD  risedronate (ACTONEL) 150 MG tablet Take 150 mg by mouth every 30 (thirty) days. with water on empty stomach, nothing by mouth or lie down for next 30 minutes.    Historical Provider, MD  tiZANidine (ZANAFLEX) 4 MG tablet take 1 tablet by mouth every 6 hours if needed for muscle spasm Patient taking  differently: take 1 tablet by mouth twice daily 04/29/15   [name], MD  traMADol Veatrice Bourbon) 50 MG tablet take 1-2 tablets by mouth every 6 hours if needed 06/14/15   Ripudeep Krystal Eaton, MD  vitamin C (ASCORBIC ACID) 500 MG tablet Take 500 mg by mouth daily.    Historical Provider, MD   BP 98/79 mmHg  Pulse 117  Temp(Src) 98.2 F (36.8 C) (Oral)  Resp 18  Ht 5\' 8"  (1.727 m)  Wt 58.968 kg  BMI 19.77 kg/m2  SpO2 95%  LMP 06/25/2006 (LMP Unknown) Physical Exam  Constitutional: She appears well-developed and well-nourished. No distress.  Mechele Claude African-American female who is legally blind, resting in bed.  HENT:  Head: Atraumatic.  Eyes: Conjunctivae are normal.  Neck: Neck supple.  Cardiovascular:  Tachycardia without murmurs rubs or gallops  Pulmonary/Chest: Effort normal and breath sounds normal.  Abdominal: Soft. There is no tenderness.  Ostomy bag, appears empty.  Abdominal hernia, soft and nontender, reducible.  Neurological: She is alert.  Skin: No rash noted.  Psychiatric: She has a normal mood and affect.  Nursing note and vitals reviewed.   ED Course  Procedures (including critical care time) Labs Review Labs Reviewed  CBC WITH DIFFERENTIAL/PLATELET - Abnormal; Notable for the following:    WBC 19.5 (*)    Neutro Abs 17.2 (*)    Monocytes Absolute 1.1 (*)    All other components within normal limits  COMPREHENSIVE METABOLIC PANEL - Abnormal; Notable for the following:    Glucose, Bld 108 (*)    BUN 21 (*)    All other components within normal limits  LIPASE, BLOOD    Imaging Review Ct Abdomen Pelvis W Contrast  06/20/2015  CLINICAL DATA:  Nausea and vomiting since 3 a.m. this morning EXAM: CT ABDOMEN AND PELVIS WITH CONTRAST TECHNIQUE: Multidetector CT imaging of the abdomen and pelvis was performed using the standard protocol following bolus administration of intravenous contrast. CONTRAST:  126mL OMNIPAQUE IOHEXOL 300 MG/ML  SOLN COMPARISON:  June 06, 2015 FINDINGS: There are liver cysts unchanged compared prior exam. No other focal liver lesion is identified. The spleen, pancreas, gallbladder, adrenal glands are normal. There is no hydronephrosis bilaterally. Mild scar of the left kidney is identified. There is atherosclerosis of the abdominal aorta without aneurysmal dilatation. There is no abdominal lymphadenopathy. There is no small bowel obstruction or diverticulitis. The appendix is normal. Anterior umbilical herniation of bowel loops are identified without evidence of incarceration or bowel obstruction. Fluid-filled bladder is normal. Uterine fibroid is unchanged. There is no pelvic lymphadenopathy. The lung bases are clear. Degenerative joint changes of the spine are noted. IMPRESSION: No acute abnormality identified in the abdomen and pelvis. There is no small bowel obstruction or diverticulitis. There is midline anterior umbilical herniation of bowel loops without evidence of incarceration or bowel obstruction. Electronically Signed   By: Abelardo Diesel M.D.   On: 06/20/2015 10:32   Dg Abd Acute W/chest  06/20/2015  CLINICAL DATA:  Nausea vomiting since 3 a.m. Patient was discharged last Tuesday with bowel obstruction. EXAM: DG ABDOMEN ACUTE W/ 1V CHEST COMPARISON:  June 10, 2015 FINDINGS: There is no evidence of dilated bowel loops or free intraperitoneal air. No radiopaque calculi or other significant radiographic abnormality is seen. Heart size and mediastinal contours are within normal limits. Both lungs are clear. Chronic deformities of bilateral shoulders, bilateral hips and scoliosis of the spine are identified. IMPRESSION: No bowel obstruction or free air.  No acute cardiopulmonary disease. Electronically Signed   By: Abelardo Diesel M.D.   On: 06/20/2015 07:19   I have personally reviewed and evaluated these images and lab results as part of my medical decision-making.   EKG Interpretation None      MDM   Final diagnoses:   Non-intractable vomiting with nausea, vomiting of unspecified type    BP 126/78 mmHg  Pulse 93  Temp(Src) 98.2 F (36.8 C) (Oral)  Resp 16  Ht 5\' 8"  (1.727 m)  Wt 58.968 kg  BMI 19.77 kg/m2  SpO2 99%  LMP 06/25/2006 (LMP Unknown)  6:35 AM Patient here with persistent nausea and vomiting which has since abated. She has no abdominal pain on exam and is able to pass flatus. Suspect symptoms due to overeating holiday food the day before.  She is tachycardic, IVF given.  Will check acute abdominal series, labs and will monitor.  Pt denies nausea at this time, no pain on exam.    9:11 AM No active nausea at this time. Patient has not vomited while in the ED. She is tolerating by mouth and resting comfortably. Acute abdominal exam show normal bowel gas pattern no evidence to suggest SBO. However, evidence of leukocytosis with WBC 19.5 which is markedly elevated from prior value. This could be due to stress reaction, but given her recent hospitalization and history of small bowel destruction, and abdominal pelvis CT scan will be obtained.  Care discussed with Dr. Tomi Bamberger.  10:51 AM Abdominal and pelvis CT scan is reassuring and demonstrate no acute abnormality in the abdomen or pelvis. Patient remains comfortably resting and has not vomited. She is able to tolerate by mouth. She is stable for discharge. Recommend follow-up with PCP for recheck of her WBC this week. Return precaution discussed. Antinausea medication prescribed.  Domenic Moras, PA-C 06/20/15 [place], MD 06/20/15 [phone]

## 2015-06-20 NOTE — ED Notes (Signed)
Patient arrived via [facility]. EMS reports that patient began vomiting and experiencing nausea at approx 0300. Patient has recently been discharged from [facility] on Tuesday with bowel obstruction. Denies pain. Patient has colostomy bag which was changed by family earlier this evening. VSS. BP - 112/72, Pulse 72, Resp 16. Upon arrival, patient denies nausea and pain. Reports last vomiting was just prior to EMS arrival.

## 2015-06-20 NOTE — ED Notes (Signed)
Pt. Given ginger ale. Pt. Able to drink entire contents and has no complaint of N/V.

## 2015-06-20 NOTE — Discharge Instructions (Signed)

## 2015-06-20 NOTE — ED Notes (Signed)
Patient transported to X-ray 

## 2015-06-21 DIAGNOSIS — I110 Hypertensive heart disease with heart failure: Secondary | ICD-10-CM | POA: Diagnosis not present

## 2015-06-21 DIAGNOSIS — I5022 Chronic systolic (congestive) heart failure: Secondary | ICD-10-CM | POA: Diagnosis not present

## 2015-06-21 DIAGNOSIS — K5669 Other intestinal obstruction: Secondary | ICD-10-CM | POA: Diagnosis not present

## 2015-06-21 DIAGNOSIS — M1000 Idiopathic gout, unspecified site: Secondary | ICD-10-CM | POA: Diagnosis not present

## 2015-06-21 DIAGNOSIS — Z433 Encounter for attention to colostomy: Secondary | ICD-10-CM | POA: Diagnosis not present

## 2015-06-23 DIAGNOSIS — K5669 Other intestinal obstruction: Secondary | ICD-10-CM | POA: Diagnosis not present

## 2015-06-23 DIAGNOSIS — I5022 Chronic systolic (congestive) heart failure: Secondary | ICD-10-CM | POA: Diagnosis not present

## 2015-06-23 DIAGNOSIS — Z433 Encounter for attention to colostomy: Secondary | ICD-10-CM | POA: Diagnosis not present

## 2015-06-23 DIAGNOSIS — I110 Hypertensive heart disease with heart failure: Secondary | ICD-10-CM | POA: Diagnosis not present

## 2015-06-23 DIAGNOSIS — M1000 Idiopathic gout, unspecified site: Secondary | ICD-10-CM | POA: Diagnosis not present

## 2015-06-28 ENCOUNTER — Ambulatory Visit: Payer: Medicare Other | Admitting: Family Medicine

## 2015-06-29 DIAGNOSIS — I5022 Chronic systolic (congestive) heart failure: Secondary | ICD-10-CM | POA: Diagnosis not present

## 2015-06-29 DIAGNOSIS — Z433 Encounter for attention to colostomy: Secondary | ICD-10-CM | POA: Diagnosis not present

## 2015-06-29 DIAGNOSIS — I110 Hypertensive heart disease with heart failure: Secondary | ICD-10-CM | POA: Diagnosis not present

## 2015-06-29 DIAGNOSIS — K5669 Other intestinal obstruction: Secondary | ICD-10-CM | POA: Diagnosis not present

## 2015-07-04 ENCOUNTER — Ambulatory Visit: Payer: Medicare Other | Admitting: Family Medicine

## 2015-07-06 DIAGNOSIS — I110 Hypertensive heart disease with heart failure: Secondary | ICD-10-CM | POA: Diagnosis not present

## 2015-07-06 DIAGNOSIS — I5022 Chronic systolic (congestive) heart failure: Secondary | ICD-10-CM | POA: Diagnosis not present

## 2015-07-06 DIAGNOSIS — K5669 Other intestinal obstruction: Secondary | ICD-10-CM | POA: Diagnosis not present

## 2015-07-06 DIAGNOSIS — Z433 Encounter for attention to colostomy: Secondary | ICD-10-CM | POA: Diagnosis not present

## 2015-07-08 DIAGNOSIS — Z933 Colostomy status: Secondary | ICD-10-CM | POA: Diagnosis not present

## 2015-07-11 ENCOUNTER — Other Ambulatory Visit: Payer: Self-pay | Admitting: *Deleted

## 2015-07-11 ENCOUNTER — Telehealth: Payer: Self-pay | Admitting: *Deleted

## 2015-07-11 MED ORDER — TIZANIDINE HCL 4 MG PO TABS: 4.0000 mg | ORAL_TABLET | Freq: Two times a day (BID) | ORAL | Status: DC

## 2015-07-11 NOTE — Telephone Encounter (Signed)
Refill request Tramadol HCL 50 mg   Last refill: 06/14/15 #60 no refills Last seen: 06/06/15  Pharmacy: [facility] [place] [place], [place]   Phone # [phone]

## 2015-07-11 NOTE — Telephone Encounter (Signed)
Call in #120 with 5 rf 

## 2015-07-12 ENCOUNTER — Telehealth: Payer: Self-pay | Admitting: Family Medicine

## 2015-07-12 MED ORDER — TRAMADOL HCL 50 MG PO TABS: ORAL_TABLET | ORAL | Status: DC

## 2015-07-12 NOTE — Telephone Encounter (Signed)
[facility] is calling requesting additional OT visit for this patient for one time a wk for 2 wks

## 2015-07-12 NOTE — Addendum Note (Signed)
Addended by: Aggie Hacker A on: 07/12/2015 02:05 PM   Modules accepted: Orders

## 2015-07-12 NOTE — Telephone Encounter (Signed)
I called in script 

## 2015-07-12 NOTE — Telephone Encounter (Signed)
Please set this up

## 2015-07-12 NOTE — Telephone Encounter (Signed)
I spoke with [name] and gave the verbal order.

## 2015-07-13 DIAGNOSIS — K5669 Other intestinal obstruction: Secondary | ICD-10-CM | POA: Diagnosis not present

## 2015-07-13 DIAGNOSIS — I110 Hypertensive heart disease with heart failure: Secondary | ICD-10-CM | POA: Diagnosis not present

## 2015-07-13 DIAGNOSIS — Z433 Encounter for attention to colostomy: Secondary | ICD-10-CM | POA: Diagnosis not present

## 2015-07-13 DIAGNOSIS — I5022 Chronic systolic (congestive) heart failure: Secondary | ICD-10-CM | POA: Diagnosis not present

## 2015-07-18 ENCOUNTER — Encounter: Payer: Self-pay | Admitting: Family Medicine

## 2015-07-18 ENCOUNTER — Ambulatory Visit (INDEPENDENT_AMBULATORY_CARE_PROVIDER_SITE_OTHER): Payer: Medicare Other | Admitting: Family Medicine

## 2015-07-18 VITALS — BP 113/100 | HR 98 | Temp 97.9°F

## 2015-07-18 DIAGNOSIS — Z23 Encounter for immunization: Secondary | ICD-10-CM | POA: Diagnosis not present

## 2015-07-18 DIAGNOSIS — M129 Arthropathy, unspecified: Secondary | ICD-10-CM | POA: Diagnosis not present

## 2015-07-18 DIAGNOSIS — K56609 Unspecified intestinal obstruction, unspecified as to partial versus complete obstruction: Secondary | ICD-10-CM

## 2015-07-18 DIAGNOSIS — I10 Essential (primary) hypertension: Secondary | ICD-10-CM

## 2015-07-18 DIAGNOSIS — K5669 Other intestinal obstruction: Secondary | ICD-10-CM | POA: Diagnosis not present

## 2015-07-18 DIAGNOSIS — I5022 Chronic systolic (congestive) heart failure: Secondary | ICD-10-CM | POA: Diagnosis not present

## 2015-07-18 MED ORDER — HYDROCODONE-ACETAMINOPHEN 10-325 MG PO TABS: 1.0000 | ORAL_TABLET | Freq: Four times a day (QID) | ORAL | Status: DC | PRN

## 2015-07-18 NOTE — Progress Notes (Signed)
Pre visit review using our clinic review tool, if applicable. No additional management support is needed unless otherwise documented below in the visit note. Pt unable to stand and weigh.   

## 2015-07-18 NOTE — Progress Notes (Signed)
   Subjective:    Patient ID: [name], female    DOB: 1956/02/29, 60 y.o.   MRN: [id number]  HPI Here to follow up a hospital stay from 06-06-15 to 06-14-15 for a small bowel obstruction, as well as for an ER visit on 06-20-15 for an apparent viral enteritis. Prior to the hospital stay she has nausea, vomiting, and abdominal pain. After a few days of bowel rest all her abdominal symptoms resolved. Then at the time of the ER visit she had nausea for about 24 hours and then this resolved. Currently she feels well. No abdominal pain (other than from her hernia). No nausea or fever. Her bowels are moving well. She takes Miralax several days a week. Her appetite is good.    Review of Systems  Constitutional: Negative.   Respiratory: Negative.   Cardiovascular: Negative.   Gastrointestinal: Negative.        Objective:   Physical Exam  Constitutional: She appears well-developed and well-nourished.  Cardiovascular: Normal rate, regular rhythm, normal heart sounds and intact distal pulses.   Pulmonary/Chest: Effort normal and breath sounds normal.  Abdominal: Soft. Bowel sounds are normal. She exhibits no distension. There is no tenderness. There is no rebound and no guarding.          Assessment & Plan:  Her SBO as resolved and her bowesl seem to be moving well. Her HTN is stable. She is is due to see Dr. Diona Fanti on 08-01-15 to follow up on a left renal lesion.

## 2015-07-20 DIAGNOSIS — I110 Hypertensive heart disease with heart failure: Secondary | ICD-10-CM | POA: Diagnosis not present

## 2015-07-20 DIAGNOSIS — K5669 Other intestinal obstruction: Secondary | ICD-10-CM | POA: Diagnosis not present

## 2015-07-20 DIAGNOSIS — Z433 Encounter for attention to colostomy: Secondary | ICD-10-CM | POA: Diagnosis not present

## 2015-07-20 DIAGNOSIS — M129 Arthropathy, unspecified: Secondary | ICD-10-CM | POA: Diagnosis not present

## 2015-07-20 DIAGNOSIS — I5022 Chronic systolic (congestive) heart failure: Secondary | ICD-10-CM | POA: Diagnosis not present

## 2015-07-21 DIAGNOSIS — I5022 Chronic systolic (congestive) heart failure: Secondary | ICD-10-CM | POA: Diagnosis not present

## 2015-07-21 DIAGNOSIS — Z433 Encounter for attention to colostomy: Secondary | ICD-10-CM | POA: Diagnosis not present

## 2015-07-21 DIAGNOSIS — M129 Arthropathy, unspecified: Secondary | ICD-10-CM | POA: Diagnosis not present

## 2015-07-21 DIAGNOSIS — K5669 Other intestinal obstruction: Secondary | ICD-10-CM | POA: Diagnosis not present

## 2015-07-21 DIAGNOSIS — I110 Hypertensive heart disease with heart failure: Secondary | ICD-10-CM | POA: Diagnosis not present

## 2015-07-22 DIAGNOSIS — M129 Arthropathy, unspecified: Secondary | ICD-10-CM | POA: Diagnosis not present

## 2015-07-23 DIAGNOSIS — M129 Arthropathy, unspecified: Secondary | ICD-10-CM | POA: Diagnosis not present

## 2015-07-24 DIAGNOSIS — M129 Arthropathy, unspecified: Secondary | ICD-10-CM | POA: Diagnosis not present

## 2015-07-25 DIAGNOSIS — M129 Arthropathy, unspecified: Secondary | ICD-10-CM | POA: Diagnosis not present

## 2015-07-26 ENCOUNTER — Encounter (HOSPITAL_COMMUNITY): Payer: Self-pay

## 2015-07-26 DIAGNOSIS — I10 Essential (primary) hypertension: Secondary | ICD-10-CM | POA: Insufficient documentation

## 2015-07-26 DIAGNOSIS — Z86018 Personal history of other benign neoplasm: Secondary | ICD-10-CM | POA: Insufficient documentation

## 2015-07-26 DIAGNOSIS — Z79899 Other long term (current) drug therapy: Secondary | ICD-10-CM | POA: Insufficient documentation

## 2015-07-26 DIAGNOSIS — R1030 Lower abdominal pain, unspecified: Secondary | ICD-10-CM | POA: Diagnosis present

## 2015-07-26 DIAGNOSIS — Z87891 Personal history of nicotine dependence: Secondary | ICD-10-CM | POA: Insufficient documentation

## 2015-07-26 DIAGNOSIS — I5022 Chronic systolic (congestive) heart failure: Secondary | ICD-10-CM | POA: Diagnosis not present

## 2015-07-26 DIAGNOSIS — R1084 Generalized abdominal pain: Secondary | ICD-10-CM | POA: Diagnosis not present

## 2015-07-26 DIAGNOSIS — Z7982 Long term (current) use of aspirin: Secondary | ICD-10-CM | POA: Insufficient documentation

## 2015-07-26 DIAGNOSIS — I110 Hypertensive heart disease with heart failure: Secondary | ICD-10-CM | POA: Diagnosis not present

## 2015-07-26 DIAGNOSIS — Z433 Encounter for attention to colostomy: Secondary | ICD-10-CM | POA: Diagnosis not present

## 2015-07-26 DIAGNOSIS — K5669 Other intestinal obstruction: Secondary | ICD-10-CM | POA: Diagnosis not present

## 2015-07-26 DIAGNOSIS — Z8673 Personal history of transient ischemic attack (TIA), and cerebral infarction without residual deficits: Secondary | ICD-10-CM | POA: Insufficient documentation

## 2015-07-26 DIAGNOSIS — M129 Arthropathy, unspecified: Secondary | ICD-10-CM | POA: Diagnosis not present

## 2015-07-26 LAB — COMPREHENSIVE METABOLIC PANEL
ALT: 17 U/L (ref 14–54)
AST: 24 U/L (ref 15–41)
Albumin: 4.2 g/dL (ref 3.5–5.0)
Alkaline Phosphatase: 94 U/L (ref 38–126)
Anion gap: 14 (ref 5–15)
BUN: 12 mg/dL (ref 6–20)
CO2: 31 mmol/L (ref 22–32)
Calcium: 10.3 mg/dL (ref 8.9–10.3)
Chloride: 101 mmol/L (ref 101–111)
Creatinine, Ser: 0.85 mg/dL (ref 0.44–1.00)
GFR calc Af Amer: >60 mL/min (ref >60)
GFR calc non Af Amer: >60 mL/min (ref >60)
Glucose, Bld: 128 mg/dL — ABNORMAL HIGH (ref 65–99)
Potassium: 3.5 mmol/L (ref 3.5–5.1)
Sodium: 146 mmol/L — ABNORMAL HIGH (ref 135–145)
Total Bilirubin: 0.8 mg/dL (ref 0.3–1.2)
Total Protein: 7.4 g/dL (ref 6.5–8.1)

## 2015-07-26 LAB — CBC
HCT: 40.2 % (ref 36.0–46.0)
Hemoglobin: 12.9 g/dL (ref 12.0–15.0)
MCH: 30.6 pg (ref 26.0–34.0)
MCHC: 32.1 g/dL (ref 30.0–36.0)
MCV: 95.3 fL (ref 78.0–100.0)
Platelets: 301 10*3/uL (ref 150–400)
RBC: 4.22 MIL/uL (ref 3.87–5.11)
RDW: 13.7 % (ref 11.5–15.5)
WBC: 13.3 10*3/uL — ABNORMAL HIGH (ref 4.0–10.5)

## 2015-07-26 LAB — LIPASE, BLOOD: Lipase: 36 U/L (ref 11–51)

## 2015-07-26 NOTE — ED Notes (Signed)
Per [facility], pt here for abd pain that started today and vomited x2. Reported to EMS she had CP but it subsided. Pt has colostomy and hx of diverticulitis. Pt is blind and lives by herself. States she gets around by walker at home but had to pivot from the EMS stretcher to a wc/.

## 2015-07-27 ENCOUNTER — Emergency Department (HOSPITAL_COMMUNITY)
Admission: EM | Admit: 2015-07-27 | Discharge: 2015-07-27 | Disposition: A | Discharge status: Home / Self Care | Payer: Medicare Other | Attending: Emergency Medicine | Admitting: Emergency Medicine

## 2015-07-27 DIAGNOSIS — R1084 Generalized abdominal pain: Secondary | ICD-10-CM

## 2015-07-27 DIAGNOSIS — M129 Arthropathy, unspecified: Secondary | ICD-10-CM | POA: Diagnosis not present

## 2015-07-27 NOTE — Discharge Instructions (Signed)
You were seen in the emergency room for abdominal pain. Given your pain is now gone and you're having good output from your colostomy I feel it is unlikely that you're having a bowel obstruction. If your pain returns and you have distention of your abdomen, decreased colostomy output, vomiting, fever, please return to the hospital.   Abdominal Pain, Adult Many things can cause abdominal pain. Usually, abdominal pain is not caused by a disease and will improve without treatment. It can often be observed and treated at home. Your health care provider will do a physical exam and possibly order blood tests and X-rays to help determine the seriousness of your pain. However, in many cases, more time must pass before a clear cause of the pain can be found. Before that point, your health care provider may not know if you need more testing or further treatment. HOME CARE INSTRUCTIONS Monitor your abdominal pain for any changes. The following actions may help to alleviate any discomfort you are experiencing:  Only take over-the-counter or prescription medicines as directed by your health care provider.  Do not take laxatives unless directed to do so by your health care provider.  Try a clear liquid diet (broth, tea, or water) as directed by your health care provider. Slowly move to a bland diet as tolerated. SEEK MEDICAL CARE IF:  You have unexplained abdominal pain.  You have abdominal pain associated with nausea or diarrhea.  You have pain when you urinate or have a bowel movement.  You experience abdominal pain that wakes you in the night.  You have abdominal pain that is worsened or improved by eating food.  You have abdominal pain that is worsened with eating fatty foods.  You have a fever. SEEK IMMEDIATE MEDICAL CARE IF:  Your pain does not go away within 2 hours.  You keep throwing up (vomiting).  Your pain is felt only in portions of the abdomen, such as the right side or the left  lower portion of the abdomen.  You pass bloody or black tarry stools. MAKE SURE YOU:  Understand these instructions.  Will watch your condition.  Will get help right away if you are not doing well or get worse.   This information is not intended to replace advice given to you by your health care provider. Make sure you discuss any questions you have with your health care provider.   Document Released: 03/21/2005 Document Revised: 03/02/2015 Document Reviewed: 02/18/2013 Elsevier Interactive Patient Education [employer].

## 2015-07-27 NOTE — ED Notes (Signed)
colostomy bag extremely full on abdominal assessment.

## 2015-07-27 NOTE — ED Provider Notes (Signed)
By signing my name below, I, [name], attest that this documentation has been prepared under the direction and in the presence of [employer], DO. Electronically Signed: [name], ED Scribe. 07/27/2015. 2:20 AM.   TIME SEEN: 2:17 AM  CHIEF COMPLAINT: Abdominal pain  HPI: HPI Comments: [name] is a 60 y.o. female with a medical hx of legally blind, HTN, stroke, rheumatoid arthritis on methotrexate, history of previous alcohol abuse, history of diverticulitis with perforation requiring colectomy who now has a colostomy and has had recurrent small bowel obstructions who presents to the Emergency Department complaining of resolved lower abdominal pain onset 7 PM yesterday. She reports that her abdominal pain is similar to when she had 4 bowel obstruction in the past that "fixed themselves". She states that she is having associated symptoms of vomiting x 3 episodes. She states that she has not tried any medications for relief for her symptoms. She denies fever, abdominal bloating, and any other symptoms. States that she did have decreased output from her ostomy but reports this is gone. States her pain is now completely gone. No more nausea.   ROS: See HPI Constitutional: no fever  Eyes: no drainage  ENT: no runny nose   Cardiovascular:  no chest pain  Resp: no SOB  GI: no vomiting GU: no dysuria Integumentary: no rash  Allergy: no hives  Musculoskeletal: no leg swelling  Neurological: no slurred speech ROS otherwise negative  PAST MEDICAL HISTORY/PAST SURGICAL HISTORY:  Past Medical History  Diagnosis Date  . Legally blind     since pt was a teenager  . Hearing aid worn     pt wears bilateral hearing aids  . Marfan's syndrome affecting skin     with scolosis  . Osteoporosis   . Hypertension   . 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 Hernia 4/08  . 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 Chronic gout 2008  . Stroke ([place]) 2/08  .  Fibroid   . Status post bunionectomy 1/10    bilateral   . Diverticulitis of large intestine with perforation   . Small bowel obstruction due to adhesions ([place])   . Rheumatoid arthritis [facility])     sees Dr. Gavin Pound   . SBO (small bowel obstruction) ([place]) 01/05/2013  . Atrial flutter ([facility])     MEDICATIONS:  Prior to Admission medications   Medication Sig Start Date End Date Taking? Authorizing Provider  Abatacept (ORENCIA) 125 MG/ML SOSY Inject 1 application into the skin once a week.   Yes Historical Provider, MD  allopurinol (ZYLOPRIM) 100 MG tablet take 1 tablet by mouth once daily 02/25/15  Yes [name], MD  amLODipine (NORVASC) 10 MG tablet take 1 tablet by mouth once daily 02/25/15  Yes [name], MD  aspirin EC 81 MG tablet Take 1 tablet (81 mg total) by mouth daily. 05/11/14  Yes Verlee Monte, MD  Calcium Carbonate-Vitamin D (CALCIUM + D PO) Take 2,000 mg by mouth daily.    Yes Historical Provider, MD  cetirizine (ZYRTEC) 10 MG tablet Take 1 tablet (10 mg total) by mouth daily. 11/17/14  Yes [name], MD  docusate sodium (COLACE) 100 MG capsule Take 100 mg by mouth 2 (two) times daily.   Yes Historical Provider, MD  folic acid (FOLVITE) 1 MG tablet Take 1 mg by mouth daily.   Yes Historical Provider, MD  megestrol (MEGACE  ES) 625 MG/5ML suspension Take 625 mg by mouth 2 (two) times daily.   Yes Historical Provider, MD  methotrexate 25 MG/ML injection Inject 25 mg into the skin once a week. Take on Mondays   Yes Historical Provider, MD  Multiple Vitamin (MULTIVITAMIN WITH MINERALS) TABS tablet Take 1 tablet by mouth daily.   Yes Historical Provider, MD  NEXIUM 40 MG capsule take 1 capsule by mouth twice a day 08/31/14  Yes [name], MD  risedronate (ACTONEL) 150 MG tablet Take 150 mg by mouth every 30 (thirty) days. with water on empty stomach, nothing by mouth or lie down for next 30 minutes.   Yes Historical Provider, MD  traMADol (ULTRAM) 50 MG tablet take 1-2  tablets by mouth every 6 hours if needed 07/12/15  Yes [name], MD  vitamin C (ASCORBIC ACID) 500 MG tablet Take 1,000 mg by mouth daily.    Yes Historical Provider, MD  albuterol (PROVENTIL HFA;VENTOLIN HFA) 108 (90 BASE) MCG/ACT inhaler Inhale 1 puff into the lungs every 4 (four) hours as needed for wheezing or shortness of breath. Patient not taking: Reported on 07/27/2015 12/29/13   [name], MD  HYDROcodone-acetaminophen [facility]) 10-325 MG tablet Take 1 tablet by mouth every 6 (six) hours as needed for severe pain. Patient not taking: Reported on 07/27/2015 07/18/15   [name], MD  ketoconazole (NIZORAL) 2 % cream Apply 1 application topically 2 (two) times daily. Patient not taking: Reported on 07/27/2015 08/10/13   [name], MD  ondansetron (ZOFRAN) 4 MG tablet Take 1 tablet (4 mg total) by mouth every 8 (eight) hours as needed for nausea or vomiting. Patient not taking: Reported on 07/18/2015 06/20/15   Domenic Moras, PA-C  polyethylene glycol (MIRALAX / GLYCOLAX) packet Take 17 g by mouth daily. Patient not taking: Reported on 07/27/2015 05/11/14   Verlee Monte, MD  promethazine (PHENERGAN) 25 MG tablet Take 1 tablet (25 mg total) by mouth every 6 (six) hours as needed for nausea or vomiting. Patient not taking: Reported on 07/18/2015 06/14/15   Ripudeep Krystal Eaton, MD  tiZANidine (ZANAFLEX) 4 MG tablet Take 1 tablet (4 mg total) by mouth 2 (two) times daily. Patient not taking: Reported on 07/27/2015 07/11/15   [name], MD    ALLERGIES:  No Known Allergies  SOCIAL HISTORY:  Social History  Substance Use Topics  . Smoking status: Former Smoker -- 1.00 packs/day    Types: Cigarettes    Quit date: 11/27/2012  . Smokeless tobacco: Never Used  . Alcohol Use: No     Comment: recovering alcoholic sober since 0000000    FAMILY HISTORY: Family History  Problem Relation Age of Onset  . Heart attack Neg Hx     EXAM: BP 114/84 mmHg  Pulse 112  Temp(Src) 98.5 F (36.9 C) (Oral)   Resp 17  SpO2 100%  LMP 06/25/2006 (LMP Unknown) CONSTITUTIONAL: Alert and oriented and responds appropriately to questions. Thin. Chronically ill appearing, but in no distress. HEAD: Normocephalic EYES: Conjunctivae clear, opacity of the right eye which is chronic ENT: normal nose; no rhinorrhea; moist mucous membranes; pharynx without lesions noted NECK: Supple, no meningismus, no LAD  CARD: Regular and intermittently minimally tachycardic; S1 and S2 appreciated; no murmurs, no clicks, no rubs, no gallops RESP: Normal chest excursion without splinting or tachypnea; breath sounds clear and equal bilaterally; no wheezes, no rhonchi, no rales, no hypoxia or respiratory distress, speaking full sentences ABD/GI: Normal bowel sounds;  non-distended; soft, non-tender, patient has a large central lower abdominal hernia that is easily reducible and nontender to palpation, no guarding or rebound, colostomy in the left lower quadrant with good output with no bloody stool or melena appreciated, no distention of the abdomen, no tympany or fluid wave BACK:  The back appears normal and is non-tender to palpation, there is no CVA tenderness EXT: Normal ROM in all joints; non-tender to palpation; no edema; normal capillary refill; no cyanosis, no calf tenderness or swelling    SKIN: Normal color for age and race; warm NEURO: Moves all extremities equally, sensation to light touch intact diffusely, cranial nerves II through XII intact, contractures of bilateral upper extremities which is chronic and secondary to her rheumatoid arthritis PSYCH: The patient's mood and manner are appropriate. Grooming and personal hygiene are appropriate.  MEDICAL DECISION MAKING: Patient here with abdominal pain that is now completely resolved. States she did have decreased output from her ostomy but this is not also improved and she has had a large output. Suspect she may have had a ileus or bowel obstruction that has resolved  spontaneously. Abdominal exam is completely benign and nontender. She does have a large lower abdominal hernia that is easily reducible. Doubt incarceration. She is able to eat and drink without difficulty. Her heart rate has improved into the low 100s when I evaluated the patient. I think she is safe to be discharged home. Her labs are unremarkable other than a mild leukocytosis of 13.3. Discussed with patient I suspect that this was an early bowel obstruction that is now resolved and I do not feel she needs abdominal imaging. She agrees with this plan. Discussed with her symptoms return or worsen she should return to the emergency department immediately. She verbalized understanding and is comfortable with this plan.  I personally performed the services described in this documentation, which was scribed in my presence. The recorded information has been reviewed and is accurate.    [place], DO 07/27/15 [id number]

## 2015-07-28 DIAGNOSIS — M129 Arthropathy, unspecified: Secondary | ICD-10-CM | POA: Diagnosis not present

## 2015-07-29 DIAGNOSIS — M129 Arthropathy, unspecified: Secondary | ICD-10-CM | POA: Diagnosis not present

## 2015-07-30 DIAGNOSIS — M129 Arthropathy, unspecified: Secondary | ICD-10-CM | POA: Diagnosis not present

## 2015-07-31 DIAGNOSIS — M129 Arthropathy, unspecified: Secondary | ICD-10-CM | POA: Diagnosis not present

## 2015-08-01 DIAGNOSIS — Z0000 Encounter for general adult medical examination without abnormal findings: Secondary | ICD-10-CM | POA: Diagnosis not present

## 2015-08-01 DIAGNOSIS — M129 Arthropathy, unspecified: Secondary | ICD-10-CM | POA: Diagnosis not present

## 2015-08-01 DIAGNOSIS — D49512 Neoplasm of unspecified behavior of left kidney: Secondary | ICD-10-CM | POA: Diagnosis not present

## 2015-08-02 DIAGNOSIS — M129 Arthropathy, unspecified: Secondary | ICD-10-CM | POA: Diagnosis not present

## 2015-08-03 DIAGNOSIS — M129 Arthropathy, unspecified: Secondary | ICD-10-CM | POA: Diagnosis not present

## 2015-08-04 DIAGNOSIS — M129 Arthropathy, unspecified: Secondary | ICD-10-CM | POA: Diagnosis not present

## 2015-08-05 ENCOUNTER — Telehealth: Payer: Self-pay | Admitting: Family Medicine

## 2015-08-05 DIAGNOSIS — M129 Arthropathy, unspecified: Secondary | ICD-10-CM | POA: Diagnosis not present

## 2015-08-05 MED ORDER — RISEDRONATE SODIUM 150 MG PO TABS: 150.0000 mg | ORAL_TABLET | ORAL | Status: DC

## 2015-08-05 NOTE — Telephone Encounter (Signed)
Refill for another year

## 2015-08-05 NOTE — Telephone Encounter (Signed)
Refill request for Risedronate 150 mg take 1 tablet every 30 days.

## 2015-08-05 NOTE — Telephone Encounter (Signed)
I sent script e-scribe. 

## 2015-08-06 DIAGNOSIS — M129 Arthropathy, unspecified: Secondary | ICD-10-CM | POA: Diagnosis not present

## 2015-08-07 DIAGNOSIS — M129 Arthropathy, unspecified: Secondary | ICD-10-CM | POA: Diagnosis not present

## 2015-08-08 ENCOUNTER — Telehealth: Payer: Self-pay | Admitting: Family Medicine

## 2015-08-08 DIAGNOSIS — M129 Arthropathy, unspecified: Secondary | ICD-10-CM | POA: Diagnosis not present

## 2015-08-08 DIAGNOSIS — Z933 Colostomy status: Secondary | ICD-10-CM | POA: Diagnosis not present

## 2015-08-08 NOTE — Telephone Encounter (Signed)
Pt needs order fax to [facility] for bone density test. Pt last bone density test was in 2015. Pt has an appt sch for mammogram and bone density test 09-12-15. Pt has medicare pri

## 2015-08-09 DIAGNOSIS — M129 Arthropathy, unspecified: Secondary | ICD-10-CM | POA: Diagnosis not present

## 2015-08-09 NOTE — Telephone Encounter (Signed)
I faxed order to [phone] [facility].

## 2015-08-09 NOTE — Telephone Encounter (Signed)
This is ready to fax  

## 2015-08-10 DIAGNOSIS — M129 Arthropathy, unspecified: Secondary | ICD-10-CM | POA: Diagnosis not present

## 2015-08-11 DIAGNOSIS — M129 Arthropathy, unspecified: Secondary | ICD-10-CM | POA: Diagnosis not present

## 2015-08-12 DIAGNOSIS — M129 Arthropathy, unspecified: Secondary | ICD-10-CM | POA: Diagnosis not present

## 2015-08-13 DIAGNOSIS — M129 Arthropathy, unspecified: Secondary | ICD-10-CM | POA: Diagnosis not present

## 2015-08-14 DIAGNOSIS — M129 Arthropathy, unspecified: Secondary | ICD-10-CM | POA: Diagnosis not present

## 2015-08-15 DIAGNOSIS — M129 Arthropathy, unspecified: Secondary | ICD-10-CM | POA: Diagnosis not present

## 2015-08-16 DIAGNOSIS — M129 Arthropathy, unspecified: Secondary | ICD-10-CM | POA: Diagnosis not present

## 2015-08-17 DIAGNOSIS — M129 Arthropathy, unspecified: Secondary | ICD-10-CM | POA: Diagnosis not present

## 2015-08-18 ENCOUNTER — Telehealth: Payer: Self-pay | Admitting: Family Medicine

## 2015-08-18 DIAGNOSIS — M129 Arthropathy, unspecified: Secondary | ICD-10-CM | POA: Diagnosis not present

## 2015-08-18 NOTE — Telephone Encounter (Signed)
[facility] is requesting a refill for patient's ear drops, (patient did know the name of it).

## 2015-08-19 DIAGNOSIS — M129 Arthropathy, unspecified: Secondary | ICD-10-CM | POA: Diagnosis not present

## 2015-08-19 MED ORDER — CIPROFLOXACIN-DEXAMETHASONE 0.3-0.1 % OT SUSP: 4.0000 [drp] | Freq: Two times a day (BID) | OTIC | Status: DC | PRN

## 2015-08-19 NOTE — Telephone Encounter (Signed)
Per Dr. Sarajane Jews send in Ciprodex otic apply 4 drops to ears twice a day as needed. I sent script e-scribe, tried to reach pt and no answer.

## 2015-08-20 DIAGNOSIS — M129 Arthropathy, unspecified: Secondary | ICD-10-CM | POA: Diagnosis not present

## 2015-08-21 DIAGNOSIS — M129 Arthropathy, unspecified: Secondary | ICD-10-CM | POA: Diagnosis not present

## 2015-08-22 DIAGNOSIS — Z79899 Other long term (current) drug therapy: Secondary | ICD-10-CM | POA: Diagnosis not present

## 2015-08-22 DIAGNOSIS — M0579 Rheumatoid arthritis with rheumatoid factor of multiple sites without organ or systems involvement: Secondary | ICD-10-CM | POA: Diagnosis not present

## 2015-08-22 DIAGNOSIS — M2550 Pain in unspecified joint: Secondary | ICD-10-CM | POA: Diagnosis not present

## 2015-08-22 DIAGNOSIS — M1A09X0 Idiopathic chronic gout, multiple sites, without tophus (tophi): Secondary | ICD-10-CM | POA: Diagnosis not present

## 2015-08-29 DIAGNOSIS — M129 Arthropathy, unspecified: Secondary | ICD-10-CM | POA: Diagnosis not present

## 2015-08-30 DIAGNOSIS — M129 Arthropathy, unspecified: Secondary | ICD-10-CM | POA: Diagnosis not present

## 2015-08-31 DIAGNOSIS — M129 Arthropathy, unspecified: Secondary | ICD-10-CM | POA: Diagnosis not present

## 2015-09-01 DIAGNOSIS — M129 Arthropathy, unspecified: Secondary | ICD-10-CM | POA: Diagnosis not present

## 2015-09-01 IMAGING — US US THYROID BIOPSY
1 series · 6 of 6 positions shown · non-contrast
Comparison: None.

CLINICAL DATA: 1.9 cm complex right thyroid cyst.

EXAM:
ULTRASOUND GUIDED NEEDLE ASPIRATE BIOPSY OF THE THYROID GLAND

[Series 1: us thyroid biopsy · 0.06mm/px · 6 acquisitions, 6 frames shown]
[im 1/6]
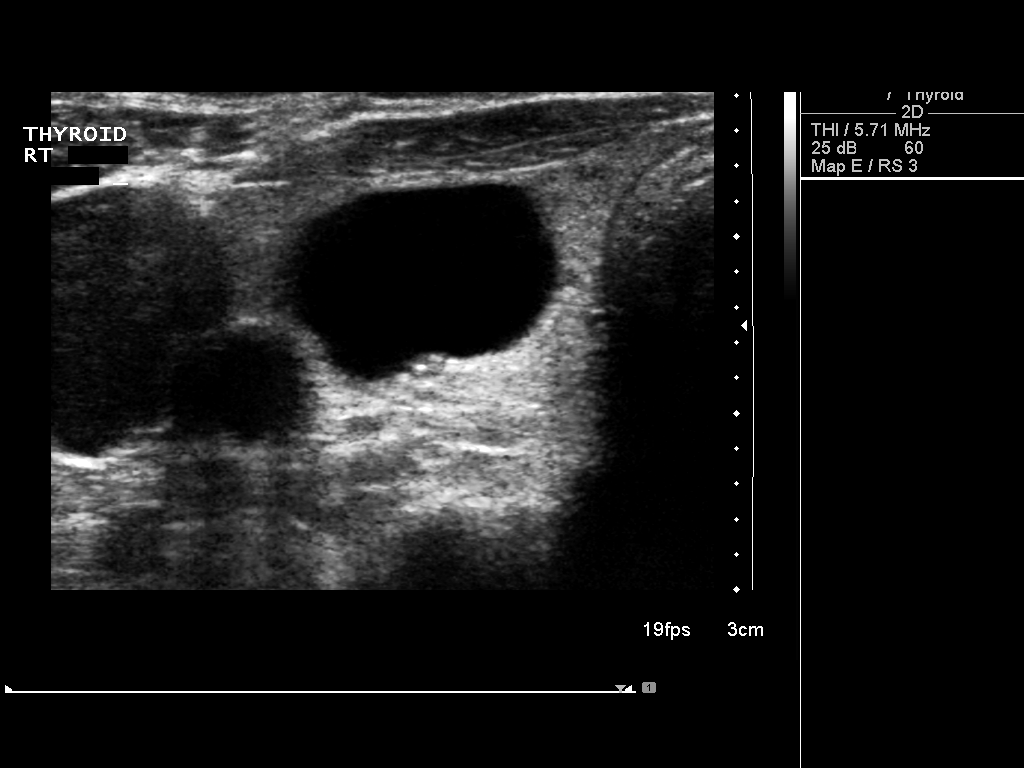
[im 2/6]
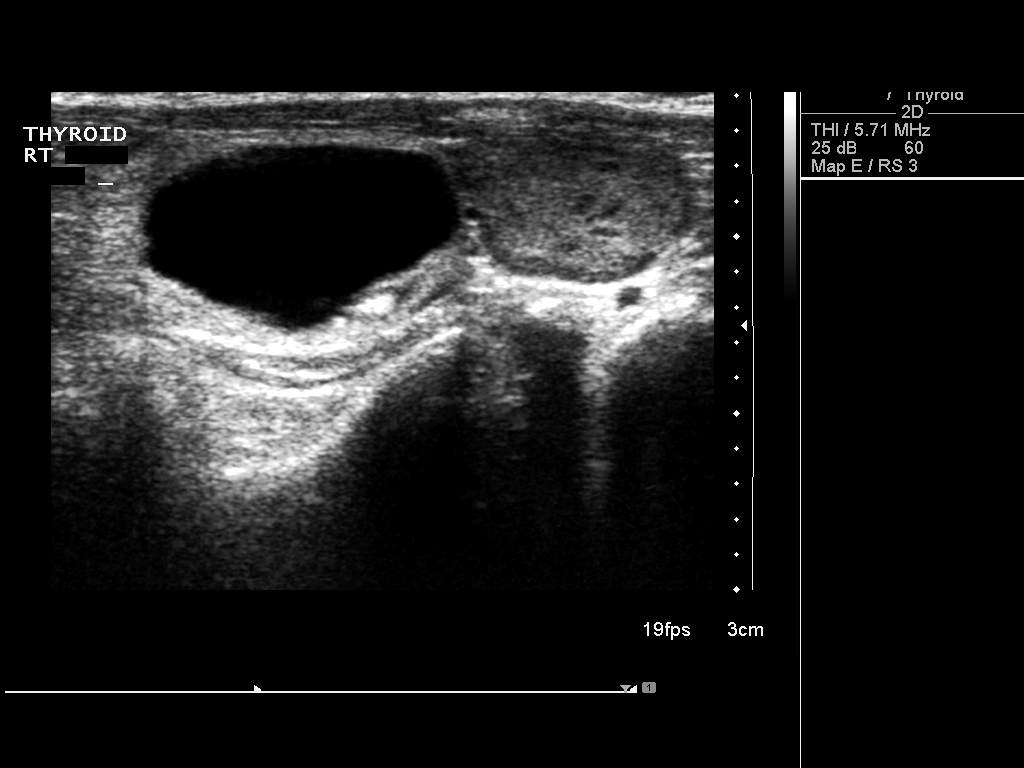
[im 3/6]
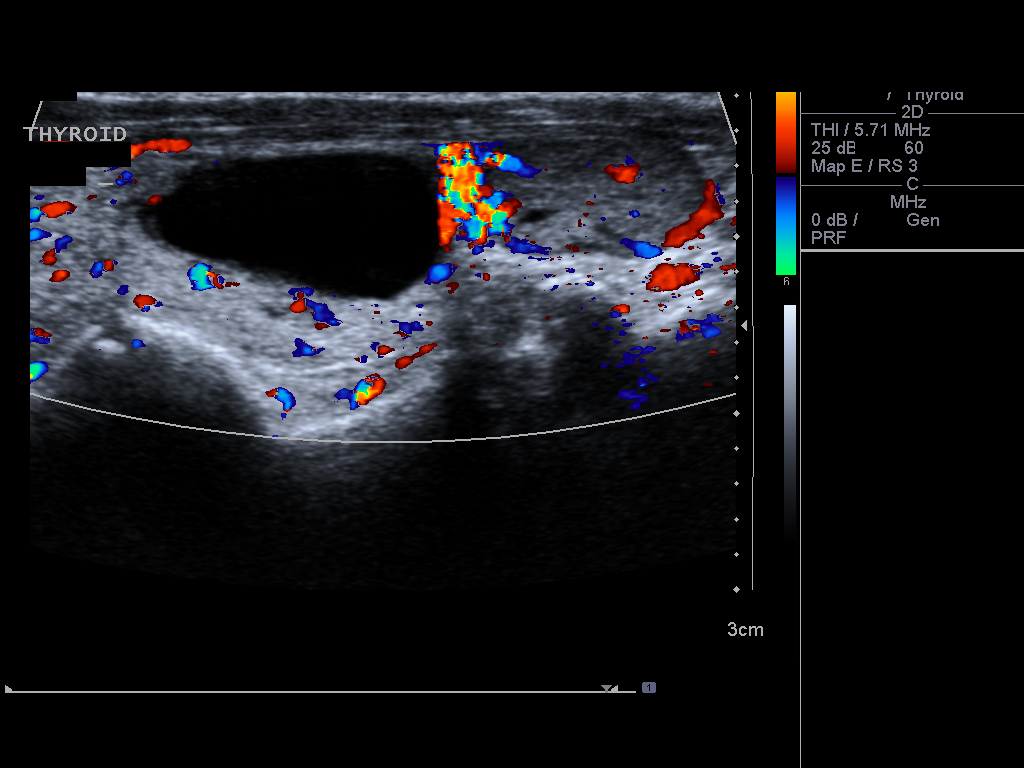
[im 4/6]
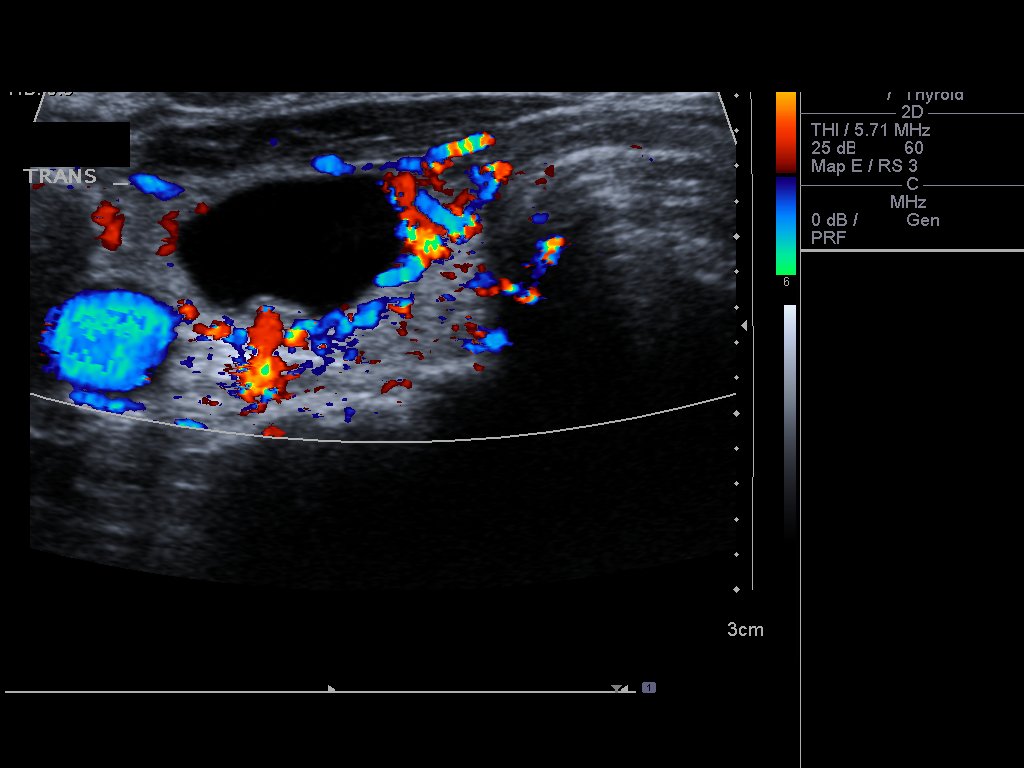
[im 5/6]
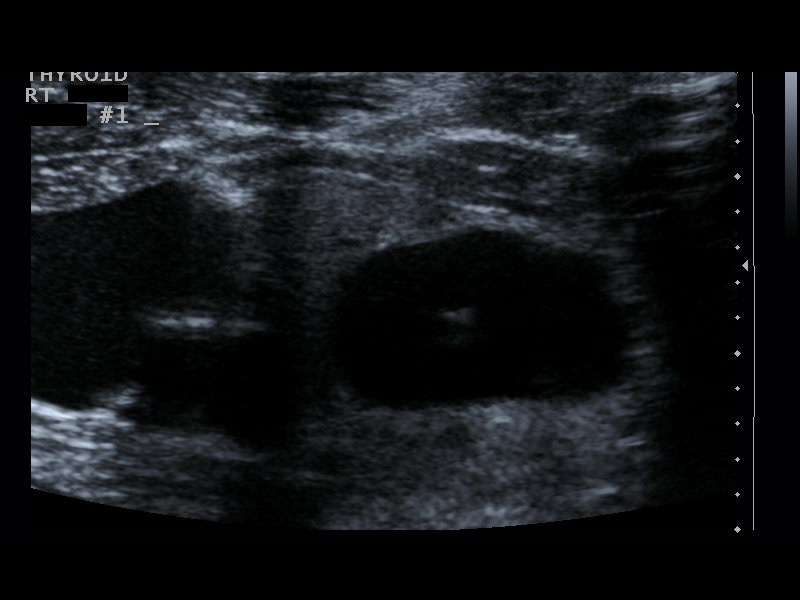
[im 6/6]
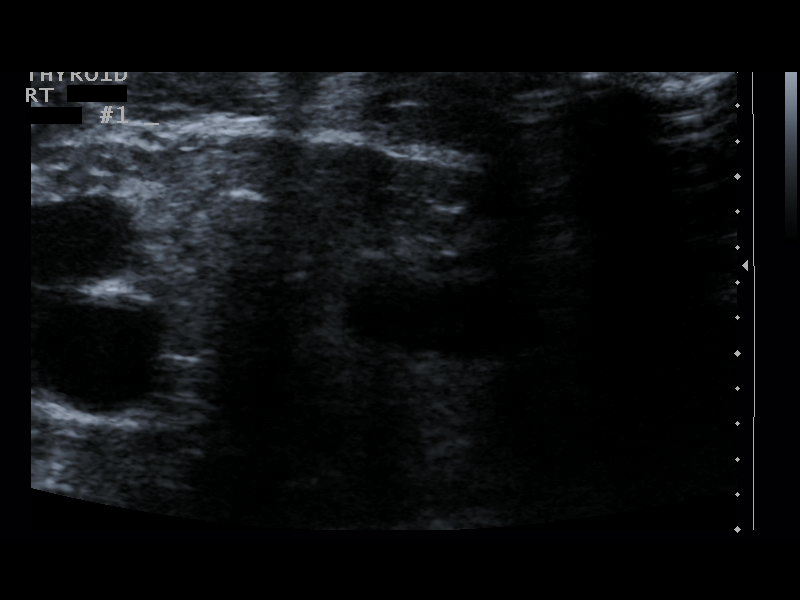

[6 of 6 positions shown; findings below may reference images not displayed]

PROCEDURE:
Thyroid biopsy was thoroughly discussed with the patient and
questions were answered. The benefits, risks, alternatives, and
complications were also discussed. The patient understands and
wishes to proceed with the procedure. Written consent was obtained.

Ultrasound was performed to localize and mark an adequate site for
the biopsy. The patient was then prepped and draped in a normal
sterile fashion. Local anesthesia was provided with 1% lidocaine.
Using direct ultrasound guidance, utilizing a 25 gauge needle, the
complex cystic lesion was completely aspirated in the lower pole of
the right lobe. Ultrasound was used to confirm needle placements on
all occasions. Specimens were sent to Pathology for analysis.

COMPLICATIONS:
None.
FINDINGS: Imaging confirms needle placement in the cystic lesion. Post
aspiration imaging demonstrates resolution of the cystic portion.
IMPRESSION: Ultrasound guided needle aspirate biopsy performed of the right
thyroid nodule.

## 2015-09-02 DIAGNOSIS — M129 Arthropathy, unspecified: Secondary | ICD-10-CM | POA: Diagnosis not present

## 2015-09-03 DIAGNOSIS — M129 Arthropathy, unspecified: Secondary | ICD-10-CM | POA: Diagnosis not present

## 2015-09-04 DIAGNOSIS — M129 Arthropathy, unspecified: Secondary | ICD-10-CM | POA: Diagnosis not present

## 2015-09-05 ENCOUNTER — Other Ambulatory Visit: Payer: Self-pay | Admitting: *Deleted

## 2015-09-05 MED ORDER — NEXIUM 40 MG PO CPDR: 40.0000 mg | DELAYED_RELEASE_CAPSULE | Freq: Two times a day (BID) | ORAL | Status: DC

## 2015-09-07 DIAGNOSIS — Z933 Colostomy status: Secondary | ICD-10-CM | POA: Diagnosis not present

## 2015-09-12 DIAGNOSIS — M85832 Other specified disorders of bone density and structure, left forearm: Secondary | ICD-10-CM | POA: Diagnosis not present

## 2015-09-12 DIAGNOSIS — M810 Age-related osteoporosis without current pathological fracture: Secondary | ICD-10-CM | POA: Diagnosis not present

## 2015-09-12 DIAGNOSIS — Z1231 Encounter for screening mammogram for malignant neoplasm of breast: Secondary | ICD-10-CM | POA: Diagnosis not present

## 2015-09-22 ENCOUNTER — Encounter: Payer: Self-pay | Admitting: Family Medicine

## 2015-09-28 DIAGNOSIS — K439 Ventral hernia without obstruction or gangrene: Secondary | ICD-10-CM | POA: Diagnosis not present

## 2015-10-03 ENCOUNTER — Other Ambulatory Visit: Payer: Self-pay | Admitting: Family Medicine

## 2015-10-03 MED ORDER — AMLODIPINE BESYLATE 10 MG PO TABS: 10.0000 mg | ORAL_TABLET | Freq: Every day | ORAL | Status: DC

## 2015-10-04 ENCOUNTER — Other Ambulatory Visit: Payer: Self-pay | Admitting: General Practice

## 2015-10-04 DIAGNOSIS — Z933 Colostomy status: Secondary | ICD-10-CM | POA: Diagnosis not present

## 2015-10-04 MED ORDER — ALLOPURINOL 100 MG PO TABS: 100.0000 mg | ORAL_TABLET | Freq: Every day | ORAL | Status: DC

## 2015-11-07 DIAGNOSIS — Z933 Colostomy status: Secondary | ICD-10-CM | POA: Diagnosis not present

## 2015-11-09 ENCOUNTER — Telehealth: Payer: Self-pay | Admitting: Family Medicine

## 2015-11-09 NOTE — Telephone Encounter (Signed)
Pt request refill of the following:  HYDROcodone-acetaminophen (NORCO) 10-325 MG tablet  ,     polyethylene glycol (MIRALAX / GLYCOLAX) packet    Phamacy: [facility]

## 2015-11-11 MED ORDER — POLYETHYLENE GLYCOL 3350 17 G PO PACK: 17.0000 g | PACK | Freq: Every day | ORAL | Status: DC

## 2015-11-11 MED ORDER — HYDROCODONE-ACETAMINOPHEN 10-325 MG PO TABS: 1.0000 | ORAL_TABLET | Freq: Four times a day (QID) | ORAL | Status: DC | PRN

## 2015-11-11 NOTE — Telephone Encounter (Signed)
The Norco was written. Please call in Miralax for one year

## 2015-11-11 NOTE — Telephone Encounter (Signed)
I left a voice message for pt, script for Norco is ready for pick up and Miralax was sent to pharmacy.

## 2015-11-14 DIAGNOSIS — M0579 Rheumatoid arthritis with rheumatoid factor of multiple sites without organ or systems involvement: Secondary | ICD-10-CM | POA: Diagnosis not present

## 2015-11-14 DIAGNOSIS — Z79899 Other long term (current) drug therapy: Secondary | ICD-10-CM | POA: Diagnosis not present

## 2015-11-14 DIAGNOSIS — M1A09X0 Idiopathic chronic gout, multiple sites, without tophus (tophi): Secondary | ICD-10-CM | POA: Diagnosis not present

## 2015-11-14 DIAGNOSIS — M2550 Pain in unspecified joint: Secondary | ICD-10-CM | POA: Diagnosis not present

## 2015-11-28 DIAGNOSIS — M79641 Pain in right hand: Secondary | ICD-10-CM | POA: Diagnosis not present

## 2015-11-28 DIAGNOSIS — M79642 Pain in left hand: Secondary | ICD-10-CM | POA: Diagnosis not present

## 2015-12-05 DIAGNOSIS — Z933 Colostomy status: Secondary | ICD-10-CM | POA: Diagnosis not present

## 2015-12-17 ENCOUNTER — Encounter (HOSPITAL_COMMUNITY): Payer: Self-pay | Admitting: Emergency Medicine

## 2015-12-17 ENCOUNTER — Other Ambulatory Visit: Payer: Self-pay

## 2015-12-17 ENCOUNTER — Emergency Department (HOSPITAL_COMMUNITY): Payer: Medicare Other

## 2015-12-17 ENCOUNTER — Emergency Department (HOSPITAL_COMMUNITY)
Admission: EM | Admit: 2015-12-17 | Discharge: 2015-12-17 | Disposition: A | Discharge status: Home / Self Care | Payer: Medicare Other | Attending: Emergency Medicine | Admitting: Emergency Medicine

## 2015-12-17 DIAGNOSIS — Z79899 Other long term (current) drug therapy: Secondary | ICD-10-CM | POA: Diagnosis not present

## 2015-12-17 DIAGNOSIS — R42 Dizziness and giddiness: Secondary | ICD-10-CM | POA: Diagnosis not present

## 2015-12-17 DIAGNOSIS — Z7982 Long term (current) use of aspirin: Secondary | ICD-10-CM | POA: Diagnosis not present

## 2015-12-17 DIAGNOSIS — I10 Essential (primary) hypertension: Secondary | ICD-10-CM | POA: Insufficient documentation

## 2015-12-17 DIAGNOSIS — R404 Transient alteration of awareness: Secondary | ICD-10-CM | POA: Diagnosis not present

## 2015-12-17 DIAGNOSIS — Z87891 Personal history of nicotine dependence: Secondary | ICD-10-CM | POA: Diagnosis not present

## 2015-12-17 DIAGNOSIS — Z7951 Long term (current) use of inhaled steroids: Secondary | ICD-10-CM | POA: Diagnosis not present

## 2015-12-17 DIAGNOSIS — Z8673 Personal history of transient ischemic attack (TIA), and cerebral infarction without residual deficits: Secondary | ICD-10-CM | POA: Diagnosis not present

## 2015-12-17 LAB — CBC WITH DIFFERENTIAL/PLATELET
Basophils Absolute: 0 10*3/uL (ref 0.0–0.1)
Basophils Relative: 0 %
Eosinophils Absolute: 0.1 10*3/uL (ref 0.0–0.7)
Eosinophils Relative: 1 %
HCT: 33.9 % — ABNORMAL LOW (ref 36.0–46.0)
Hemoglobin: 11.2 g/dL — ABNORMAL LOW (ref 12.0–15.0)
Lymphocytes Relative: 25 %
Lymphs Abs: 1.6 10*3/uL (ref 0.7–4.0)
MCH: 31.1 pg (ref 26.0–34.0)
MCHC: 33 g/dL (ref 30.0–36.0)
MCV: 94.2 fL (ref 78.0–100.0)
Monocytes Absolute: 0.3 10*3/uL (ref 0.1–1.0)
Monocytes Relative: 5 %
Neutro Abs: 4.3 10*3/uL (ref 1.7–7.7)
Neutrophils Relative %: 69 %
Platelets: 244 10*3/uL (ref 150–400)
RBC: 3.6 MIL/uL — ABNORMAL LOW (ref 3.87–5.11)
RDW: 13.7 % (ref 11.5–15.5)
WBC: 6.3 10*3/uL (ref 4.0–10.5)

## 2015-12-17 LAB — BASIC METABOLIC PANEL
Anion gap: 7 (ref 5–15)
BUN: 18 mg/dL (ref 6–20)
CO2: 26 mmol/L (ref 22–32)
Calcium: 8.9 mg/dL (ref 8.9–10.3)
Chloride: 107 mmol/L (ref 101–111)
Creatinine, Ser: 0.67 mg/dL (ref 0.44–1.00)
GFR calc Af Amer: >60 mL/min (ref >60)
GFR calc non Af Amer: >60 mL/min (ref >60)
Glucose, Bld: 92 mg/dL (ref 65–99)
Potassium: 3.8 mmol/L (ref 3.5–5.1)
Sodium: 140 mmol/L (ref 135–145)

## 2015-12-17 LAB — URINALYSIS, ROUTINE W REFLEX MICROSCOPIC
Bilirubin Urine: NEGATIVE
Glucose, UA: NEGATIVE mg/dL
Hgb urine dipstick: NEGATIVE
Ketones, ur: NEGATIVE mg/dL
Leukocytes, UA: NEGATIVE
Nitrite: NEGATIVE
Protein, ur: NEGATIVE mg/dL
Specific Gravity, Urine: 1.006 (ref 1.005–1.030)
pH: 7 (ref 5.0–8.0)

## 2015-12-17 MED ORDER — MECLIZINE HCL 25 MG PO TABS: 25.0000 mg | ORAL_TABLET | Freq: Three times a day (TID) | ORAL | Status: DC | PRN

## 2015-12-17 MED ORDER — MECLIZINE HCL 25 MG PO TABS
25.0000 mg | ORAL_TABLET | Freq: Once | ORAL | Status: AC
  Administered 2015-12-17: 25 mg via ORAL
  Filled 2015-12-17: qty 1

## 2015-12-17 MED ORDER — SODIUM CHLORIDE 0.9 % IV BOLUS (SEPSIS)
1000.0000 mL | Freq: Once | INTRAVENOUS | Status: AC
  Administered 2015-12-17: 1000 mL via INTRAVENOUS

## 2015-12-17 NOTE — Discharge Instructions (Signed)
Dizziness °Dizziness is a common problem. It is a feeling of unsteadiness or light-headedness. You may feel like you are about to faint. Dizziness can lead to injury if you stumble or fall. Anyone can become dizzy, but dizziness is more common in older adults. This condition can be caused by a number of things, including medicines, dehydration, or illness. °HOME CARE INSTRUCTIONS °Taking these steps may help with your condition: °Eating and Drinking °· Drink enough fluid to keep your urine clear or pale yellow. This helps to keep you from becoming dehydrated. Try to drink more clear fluids, such as water. °· Do not drink alcohol. °· Limit your caffeine intake if directed by your health care provider. °· Limit your salt intake if directed by your health care provider. °Activity °· Avoid making quick movements. °· Rise slowly from chairs and steady yourself until you feel okay. °· In the morning, first sit up on the side of the bed. When you feel okay, stand slowly while you hold onto something until you know that your balance is fine. °· Move your legs often if you need to stand in one place for a long time. Tighten and relax your muscles in your legs while you are standing. °· Do not drive or operate heavy machinery if you feel dizzy. °· Avoid bending down if you feel dizzy. Place items in your home so that they are easy for you to reach without leaning over. °Lifestyle °· Do not use any tobacco products, including cigarettes, chewing tobacco, or electronic cigarettes. If you need help quitting, ask your health care provider. °· Try to reduce your stress level, such as with yoga or meditation. Talk with your health care provider if you need help. °General Instructions °· Watch your dizziness for any changes. °· Take medicines only as directed by your health care provider. Talk with your health care provider if you think that your dizziness is caused by a medicine that you are taking. °· Tell a friend or a family  member that you are feeling dizzy. If he or she notices any changes in your behavior, have this person call your health care provider. °· Keep all follow-up visits as directed by your health care provider. This is important. °SEEK MEDICAL CARE IF: °· Your dizziness does not go away. °· Your dizziness or light-headedness gets worse. °· You feel nauseous. °· You have reduced hearing. °· You have new symptoms. °· You are unsteady on your feet or you feel like the room is spinning. °SEEK IMMEDIATE MEDICAL CARE IF: °· You vomit or have diarrhea and are unable to eat or drink anything. °· You have problems talking, walking, swallowing, or using your arms, hands, or legs. °· You feel generally weak. °· You are not thinking clearly or you have trouble forming sentences. It may take a friend or family member to notice this. °· You have chest pain, abdominal pain, shortness of breath, or sweating. °· Your vision changes. °· You notice any bleeding. °· You have a headache. °· You have neck pain or a stiff neck. °· You have a fever. °  °This information is not intended to replace advice given to you by your health care provider. Make sure you discuss any questions you have with your health care provider. °  °Document Released: 12/05/2000 Document Revised: 10/26/2014 Document Reviewed: 06/07/2014 °Elsevier Interactive Patient Education ©2016 [employer] ° °Benign Positional Vertigo °Vertigo is the feeling that you or your surroundings are moving when they are not.   Benign positional vertigo is the most common form of vertigo. The cause of this condition is not serious (is benign). This condition is triggered by certain movements and positions (is positional). This condition can be dangerous if it occurs while you are doing something that could endanger you or others, such as driving.  °CAUSES °In many cases, the cause of this condition is not known. It may be caused by a disturbance in an area of the inner ear that helps your  brain to sense movement and balance. This disturbance can be caused by a viral infection (labyrinthitis), head injury, or repetitive motion. °RISK FACTORS °This condition is more likely to develop in: °· Women. °· People who are 60 years of age or older. °SYMPTOMS °Symptoms of this condition usually happen when you move your head or your eyes in different directions. Symptoms may start suddenly, and they usually last for less than a minute. Symptoms may include: °· Loss of balance and falling. °· Feeling like you are spinning or moving. °· Feeling like your surroundings are spinning or moving. °· Nausea and vomiting. °· Blurred vision. °· Dizziness. °· Involuntary eye movement (nystagmus). °Symptoms can be mild and cause only slight annoyance, or they can be severe and interfere with daily life. Episodes of benign positional vertigo may return (recur) over time, and they may be triggered by certain movements. Symptoms may improve over time. °DIAGNOSIS °This condition is usually diagnosed by medical history and a physical exam of the head, neck, and ears. You may be referred to a health care provider who specializes in ear, nose, and throat (ENT) problems (otolaryngologist) or a provider who specializes in disorders of the nervous system (neurologist). You may have additional testing, including: °· MRI. °· A CT scan. °· Eye movement tests. Your health care provider may ask you to change positions quickly while he or she watches you for symptoms of benign positional vertigo, such as nystagmus. Eye movement may be tested with an electronystagmogram (ENG), caloric stimulation, the Dix-Hallpike test, or the roll test. °· An electroencephalogram (EEG). This records electrical activity in your brain. °· Hearing tests. °TREATMENT °Usually, your health care provider will treat this by moving your head in specific positions to adjust your inner ear back to normal. Surgery may be needed in severe cases, but this is rare. In  some cases, benign positional vertigo may resolve on its own in 2-4 weeks. °HOME CARE INSTRUCTIONS °Safety °· Move slowly. Avoid sudden body or head movements. °· Avoid driving. °· Avoid operating heavy machinery. °· Avoid doing any tasks that would be dangerous to you or others if a vertigo episode would occur. °· If you have trouble walking or keeping your balance, try using a cane for stability. If you feel dizzy or unstable, sit down right away. °· Return to your normal activities as told by your health care provider. Ask your health care provider what activities are safe for you. °General Instructions °· Take over-the-counter and prescription medicines only as told by your health care provider. °· Avoid certain positions or movements as told by your health care provider. °· Drink enough fluid to keep your urine clear or pale yellow. °· Keep all follow-up visits as told by your health care provider. This is important. °SEEK MEDICAL CARE IF: °· You have a fever. °· Your condition gets worse or you develop new symptoms. °· Your family or friends notice any behavioral changes. °· Your nausea or vomiting gets worse. °· You have numbness or a "  pins and needles" sensation. °SEEK IMMEDIATE MEDICAL CARE IF: °· You have difficulty speaking or moving. °· You are always dizzy. °· You faint. °· You develop severe headaches. °· You have weakness in your legs or arms. °· You have changes in your hearing or vision. °· You develop a stiff neck. °· You develop sensitivity to light. °  °This information is not intended to replace advice given to you by your health care provider. Make sure you discuss any questions you have with your health care provider. °  °Document Released: 03/19/2006 Document Revised: 03/02/2015 Document Reviewed: 10/04/2014 °Elsevier Interactive Patient Education ©2016 [employer] Inc. ° °

## 2015-12-17 NOTE — ED Notes (Signed)
Pt states dizziness resolved. Pt with no c/o dizziness when standing for orthostatic vs.

## 2015-12-17 NOTE — ED Notes (Signed)
Bed: [id number] Expected date:  Expected time:  Means of arrival:  Comments: 60 yo dizziness/HA

## 2015-12-17 NOTE — ED Notes (Signed)
Pt woke at 5am with dizziness / vertigo. Pt denies n/v. Denies weakness.

## 2015-12-17 NOTE — ED Provider Notes (Signed)
CSN: [id number]     Arrival date & time 12/17/15  1415 History   First MD Initiated Contact with Patient 12/17/15 1418     Chief Complaint  Patient presents with  . Dizziness    [name] is a 60 y.o. female who presents to the emergency department complaining of dizziness/lightheadedness for the past week it has worsened since 5 AM this morning. Patient reports her symptoms are worse with changing position as well as with movement of her head earlier today. She reports currently movement of her head does not make her feel dizzy. She is having trouble differentiating between lightheadedness and dizziness and complains of both. She reports feeling lightheaded when getting out of bed. She reports having a slight frontal headache currently. She reports she has been eating and drinking normally. She denies neck pain, numbness, tingling or weakness. The patient is blind. She denies fevers, coughing, chest pain, shortness of breath, palpitations, numbness, tingling, weakness, neck pain, back pain, sore throat, trouble swallowing, or ear pain.    Patient is a 61 y.o. female presenting with dizziness. The history is provided by the patient. No language interpreter was used.  Dizziness Associated symptoms: headaches   Associated symptoms: no chest pain, no nausea, no shortness of breath, no vomiting and no weakness     Past Medical History  Diagnosis Date  . Legally blind     since pt was a teenager  . Hearing aid worn     pt wears bilateral hearing aids  . Marfan's syndrome affecting skin     with scolosis  . Osteoporosis   . Hypertension   . 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 Hernia 4/08  . 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 Chronic gout 2008  . Stroke ([place]) 2/08  . Fibroid   . Status post bunionectomy 1/10    bilateral   . Diverticulitis of large intestine with perforation   . Small bowel obstruction due to adhesions ([place])   . Rheumatoid  arthritis [facility])     sees Dr. Gavin Pound   . SBO (small bowel obstruction) ([place]) 01/05/2013  . Atrial flutter [facility])    Past Surgical History  Procedure Laterality Date  . Bunionectomy Bilateral 1/10  . Total knee arthroplasty Bilateral 6/08  . Hernia repair    . Laparotomy N/A 11/27/2012    Procedure: EXPLORATORY LAPAROTOMY SIGMOID COLECTOMY, COLOSTOMY;  Surgeon: Madilyn Hook, DO;  Location: [facility] ORS;  Service: General;  Laterality: N/A;  . Colostomy  11/27/2012  . Laparoscopic abdominal exploration N/A 01/06/2013    Procedure: LAPAROSCOPIC ABDOMINAL EXPLORATION;  Surgeon: Adin Hector, MD;  Location: [facility] ORS;  Service: General;  Laterality: N/A;  . Laparoscopic lysis of adhesions N/A 01/06/2013    Procedure: LAPAROSCOPIC LYSIS OF ADHESIONS/ INTEROTOMY REPAIR;  Surgeon: Adin Hector, MD;  Location: [facility] ORS;  Service: General;  Laterality: N/A;  . Colon surgery      colectomy  . Ablation  01/04/14    atrial flutter ablation (2 circuits) by Dr Lovena Le  . Atrial flutter ablation N/A 01/04/2014    Procedure: ATRIAL FLUTTER ABLATION;  Surgeon: Evans Lance, MD;  Location: [facility] CATH LAB;  Service: Cardiovascular;  Laterality: N/A;   Family History  Problem Relation Age of Onset  . Heart attack Neg Hx    Social History  Substance Use Topics  . Smoking status: Former Smoker -- 1.00 packs/day  Types: Cigarettes    Quit date: 11/27/2012  . Smokeless tobacco: Never Used  . Alcohol Use: No     Comment: recovering alcoholic sober since 0000000   OB History    Gravida Para Term Preterm AB TAB SAB Ectopic Multiple Living   1 1 1       1      Review of Systems  Constitutional: Negative for fever and chills.  HENT: Negative for congestion, ear pain, mouth sores, sore throat and trouble swallowing.   Eyes:       Patient is blind.   Respiratory: Negative for cough, shortness of breath and wheezing.   Cardiovascular: Negative for chest pain.  Gastrointestinal: Negative for nausea, vomiting and  abdominal pain.  Genitourinary: Negative for dysuria, decreased urine volume and difficulty urinating.  Musculoskeletal: Negative for back pain and neck pain.  Skin: Negative for rash.  Neurological: Positive for dizziness, light-headedness and headaches. Negative for syncope, speech difficulty, weakness and numbness.      Allergies  Review of patient's allergies indicates no known allergies.  Home Medications   Prior to Admission medications   Medication Sig Start Date End Date Taking? Authorizing Provider  Abatacept (ORENCIA) 125 MG/ML SOSY Inject 1 application into the skin once a week.    Historical Provider, MD  albuterol (PROVENTIL HFA;VENTOLIN HFA) 108 (90 BASE) MCG/ACT inhaler Inhale 1 puff into the lungs every 4 (four) hours as needed for wheezing or shortness of breath. Patient not taking: Reported on 07/27/2015 12/29/13   [name], MD  allopurinol (ZYLOPRIM) 100 MG tablet Take 1 tablet (100 mg total) by mouth daily. 10/04/15   [name], MD  amLODipine (NORVASC) 10 MG tablet Take 1 tablet (10 mg total) by mouth daily. 10/03/15   [name], MD  aspirin EC 81 MG tablet Take 1 tablet (81 mg total) by mouth daily. 05/11/14   Verlee Monte, MD  Calcium Carbonate-Vitamin D (CALCIUM + D PO) Take 2,000 mg by mouth daily.     Historical Provider, MD  cetirizine (ZYRTEC) 10 MG tablet Take 1 tablet (10 mg total) by mouth daily. 11/17/14   [name], MD  ciprofloxacin-dexamethasone (CIPRODEX) otic suspension Place 4 drops into both ears 2 (two) times daily as needed. 08/19/15   [name], MD  docusate sodium (COLACE) 100 MG capsule Take 100 mg by mouth 2 (two) times daily.    Historical Provider, MD  folic acid (FOLVITE) 1 MG tablet Take 1 mg by mouth daily.    Historical Provider, MD  HYDROcodone-acetaminophen (NORCO) 10-325 MG tablet Take 1 tablet by mouth every 6 (six) hours as needed for severe pain. 11/11/15   [name], MD  ketoconazole (NIZORAL) 2 % cream Apply 1  application topically 2 (two) times daily. Patient not taking: Reported on 07/27/2015 08/10/13   [name], MD  meclizine (ANTIVERT) 25 MG tablet Take 1 tablet (25 mg total) by mouth 3 (three) times daily as needed for dizziness. 12/17/15   Waynetta Pean, PA-C  megestrol (MEGACE ES) 625 MG/5ML suspension Take 625 mg by mouth 2 (two) times daily.    Historical Provider, MD  methotrexate 25 MG/ML injection Inject 25 mg into the skin once a week. Take on Mondays    Historical Provider, MD  Multiple Vitamin (MULTIVITAMIN WITH MINERALS) TABS tablet Take 1 tablet by mouth daily.    Historical Provider, MD  NEXIUM 40 MG capsule Take 1 capsule (40 mg total) by mouth 2 (two)  times daily. 09/05/15   [name], MD  ondansetron (ZOFRAN) 4 MG tablet Take 1 tablet (4 mg total) by mouth every 8 (eight) hours as needed for nausea or vomiting. Patient not taking: Reported on 07/18/2015 06/20/15   Domenic Moras, PA-C  polyethylene glycol (MIRALAX / GLYCOLAX) packet Take 17 g by mouth daily. 11/11/15   [name], MD  promethazine (PHENERGAN) 25 MG tablet Take 1 tablet (25 mg total) by mouth every 6 (six) hours as needed for nausea or vomiting. Patient not taking: Reported on 07/18/2015 06/14/15   Ripudeep Krystal Eaton, MD  risedronate (ACTONEL) 150 MG tablet Take 1 tablet (150 mg total) by mouth every 30 (thirty) days. with water on empty stomach, nothing by mouth or lie down for next 30 minutes. 08/05/15   [name], MD  tiZANidine (ZANAFLEX) 4 MG tablet Take 1 tablet (4 mg total) by mouth 2 (two) times daily. Patient not taking: Reported on 07/27/2015 07/11/15   [name], MD  traMADol Veatrice Bourbon) 50 MG tablet take 1-2 tablets by mouth every 6 hours if needed 07/12/15   [name], MD  vitamin C (ASCORBIC ACID) 500 MG tablet Take 1,000 mg by mouth daily.     Historical Provider, MD   BP 146/79 mmHg  Pulse 94  Temp(Src) 98.4 F (36.9 C) (Oral)  Resp 25  SpO2 99%  LMP 06/25/2006 (LMP Unknown) Physical Exam   Constitutional: She is oriented to person, place, and time. She appears well-nourished. No distress.  Nontoxic appearing.  HENT:  Head: Atraumatic.  Right Ear: External ear normal.  Left Ear: External ear normal.  Mouth/Throat: Oropharynx is clear and moist. No oropharyngeal exudate.  Mucous membranes are moist.  Eyes: Right eye exhibits no discharge. Left eye exhibits no discharge.  Patient is blind. No eye discharge.   Neck: Normal range of motion. Neck supple. No JVD present. No tracheal deviation present.  Cardiovascular: Normal rate, regular rhythm, normal heart sounds and intact distal pulses.   Pulmonary/Chest: Effort normal and breath sounds normal. No stridor. No respiratory distress. She has no wheezes. She has no rales.  Lungs are clear to auscultation bilaterally.  Abdominal: Soft. There is no tenderness. There is no guarding.  Colostomy in place in left abdomen. Abdomen is soft and nontender to palpation.  Musculoskeletal: Normal range of motion. She exhibits no edema or tenderness.  Patient is spontaneously moving all extremities in a coordinated fashion exhibiting good strength.   Lymphadenopathy:    She has no cervical adenopathy.  Neurological: She is alert and oriented to person, place, and time. Coordination normal.  She is alert and oriented. Speech is clear and coherent. Sensation is intact to her bilateral upper and lower extremities. No facial droop. Tongue protrusion normal. Good strength to her bilateral upper and lower extremities.   Skin: Skin is warm and dry. No rash noted. She is not diaphoretic. No erythema. No pallor.  Psychiatric: She has a normal mood and affect. Her behavior is normal.  Nursing note and vitals reviewed.   ED Course  Procedures (including critical care time) Labs Review Labs Reviewed  CBC WITH DIFFERENTIAL/PLATELET - Abnormal; Notable for the following:    RBC 3.60 (*)    Hemoglobin 11.2 (*)    HCT 33.9 (*)    All other components  within normal limits  BASIC METABOLIC PANEL  URINALYSIS, ROUTINE W REFLEX MICROSCOPIC (NOT AT [facility])    Imaging Review Ct Head Wo Contrast  12/17/2015  CLINICAL DATA:  Awoke at 0500 hours with dizziness and vertigo, legally blind, hypertension, prior stroke, hypertension, former smoker EXAM: CT HEAD WITHOUT CONTRAST TECHNIQUE: Contiguous axial images were obtained from the base of the skull through the vertex without intravenous contrast. COMPARISON:  07/27/2006 FINDINGS: Generalized atrophy. Normal ventricular morphology. No midline shift or mass effect. Small vessel chronic ischemic changes of deep cerebral white matter. No intracranial hemorrhage, mass lesion, or evidence acute infarction. Probable small old external capsule lacunar infarcts. LEFT orbital prosthesis. RIGHT phthisis bulbi. No extra-axial fluid collections. Sinuses clear and bones unremarkable. IMPRESSION: Atrophy with small vessel chronic ischemic changes of deep cerebral white matter. Probable old external capsule lacunar infarcts bilaterally. No acute intracranial abnormalities. Electronically Signed   By: Lavonia Dana M.D.   On: 12/17/2015 15:54   I have personally reviewed and evaluated these images and lab results as part of my medical decision-making.   EKG Interpretation   Date/Time:  Saturday December 17 2015 14:53:18 EDT Ventricular Rate:  70 PR Interval:    QRS Duration: 76 QT Interval:  432 QTC Calculation: 467 R Axis:   40 Text Interpretation:  Sinus rhythm Prolonged PR interval Probable left  ventricular hypertrophy Since last tracing rate slower T wave inversion  RESOLVED SINCE PREVIOUS Otherwise no significant change Confirmed by KNOTT  MD, DANIEL [phone]) on 12/17/2015 3:29:27 PM     Filed Vitals:   12/17/15 1433 12/17/15 1500 12/17/15 1600 12/17/15 1630  BP: 134/83 153/80 144/89 146/79  Pulse:  67 94   Temp:      TempSrc:      Resp: 15 16 25 25   SpO2:  100%  99%    MDM   Meds given in  ED:  Medications  sodium chloride 0.9 % bolus 1,000 mL (0 mLs Intravenous Stopped 12/17/15 1631)  meclizine (ANTIVERT) tablet 25 mg (25 mg Oral Given 12/17/15 1455)    New Prescriptions   MECLIZINE (ANTIVERT) 25 MG TABLET    Take 1 tablet (25 mg total) by mouth 3 (three) times daily as needed for dizziness.    Final diagnoses:  Vertigo  Dizziness   This  is a 60 y.o. female who presents to the emergency department complaining of dizziness/lightheadedness for the past week it has worsened since 5 AM this morning. Patient reports her symptoms are worse with changing position as well as with movement of her head earlier today. She reports currently movement of her head does not make her feel dizzy. She is having trouble differentiating between lightheadedness and dizziness and complains of both. She reports feeling lightheaded when getting out of bed. She reports having a slight frontal headache currently. She reports she has been eating and drinking normally. She denies neck pain, numbness, tingling or weakness. The patient is blind. She denies fevers, coughing, chest pain, shortness of breath.  On exam the patient is afebrile nontoxic appearing. She has no focal neurological deficits appreciated. EKG shows sinus rhythm. CBC and BMP are unremarkable. Urinalysis shows no signs of infection. CT head shows no acute findings. After patient received fluid bolus and meclizine patient reports that her symptoms have resolved. She is feeling back to baseline. She did not feel lightheaded or dizzy with standing for orthostatic vital signs. She is not orthostatic. Patient possibly with vertigo. We'll discharge her prescriptions for meclizine and have her follow closely with her primary care provider. I advised the patient to follow-up with their primary care provider this week. I advised the patient to return to  the emergency department with new or worsening symptoms or new concerns. The patient and aid at bedside  verbalized understanding and agreement with plan.    This patient was discussed with Dr. Laneta Simmers who agrees with assessment and plan.   Waynetta Pean, PA-C 12/17/15 1642  Leo Grosser, MD 12/19/15 [phone]

## 2015-12-19 ENCOUNTER — Telehealth: Payer: Self-pay | Admitting: *Deleted

## 2015-12-19 DIAGNOSIS — Z933 Colostomy status: Secondary | ICD-10-CM | POA: Diagnosis not present

## 2015-12-19 NOTE — Telephone Encounter (Signed)
Patient returned call- scheduled 08 recall AEX for 01-03-16 -eh

## 2015-12-19 NOTE — Telephone Encounter (Signed)
Recall 08 - Left message to call and schedule AEX -eh

## 2015-12-23 IMAGING — CR DG ABDOMEN ACUTE W/ 1V CHEST
3 series · 3 of 3 positions shown · non-contrast
Comparison: Chest radiograph 05/04/2014, abdominal radiograph
11/23/2014, CT abdomen and pelvis 11/22/2014

CLINICAL DATA: Small bowel obstruction, feeling better, history
hypertension

EXAM:
DG ABDOMEN ACUTE W/ 1V CHEST

[abdomen erect]
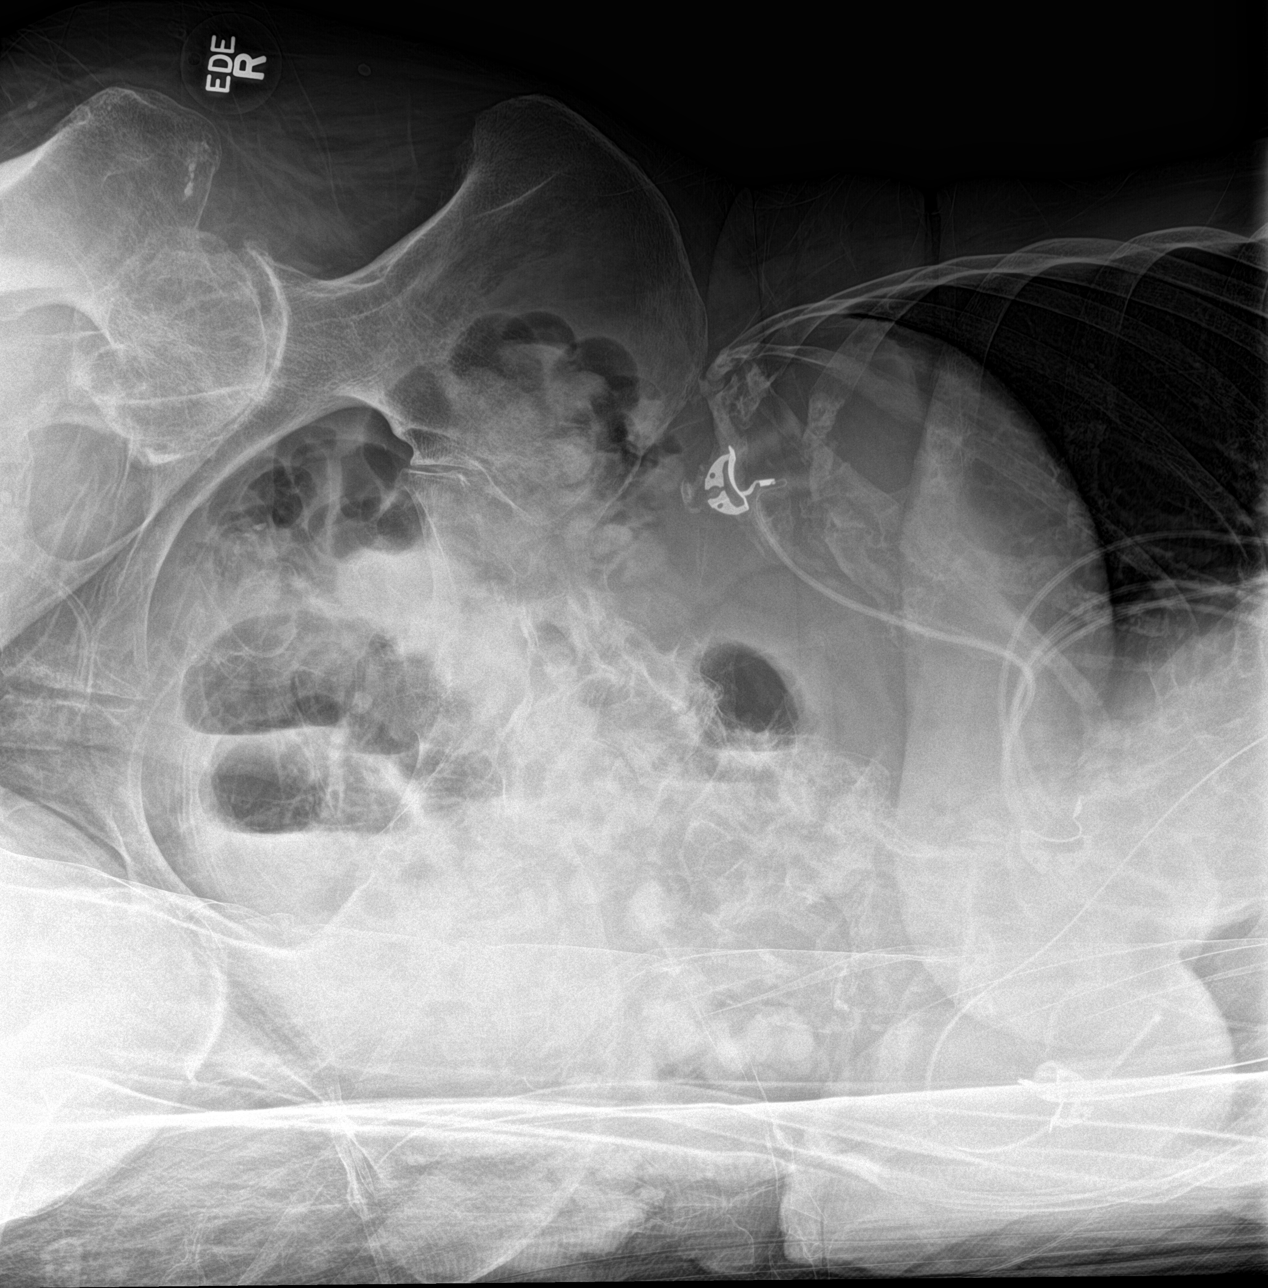

[abdomen supine]
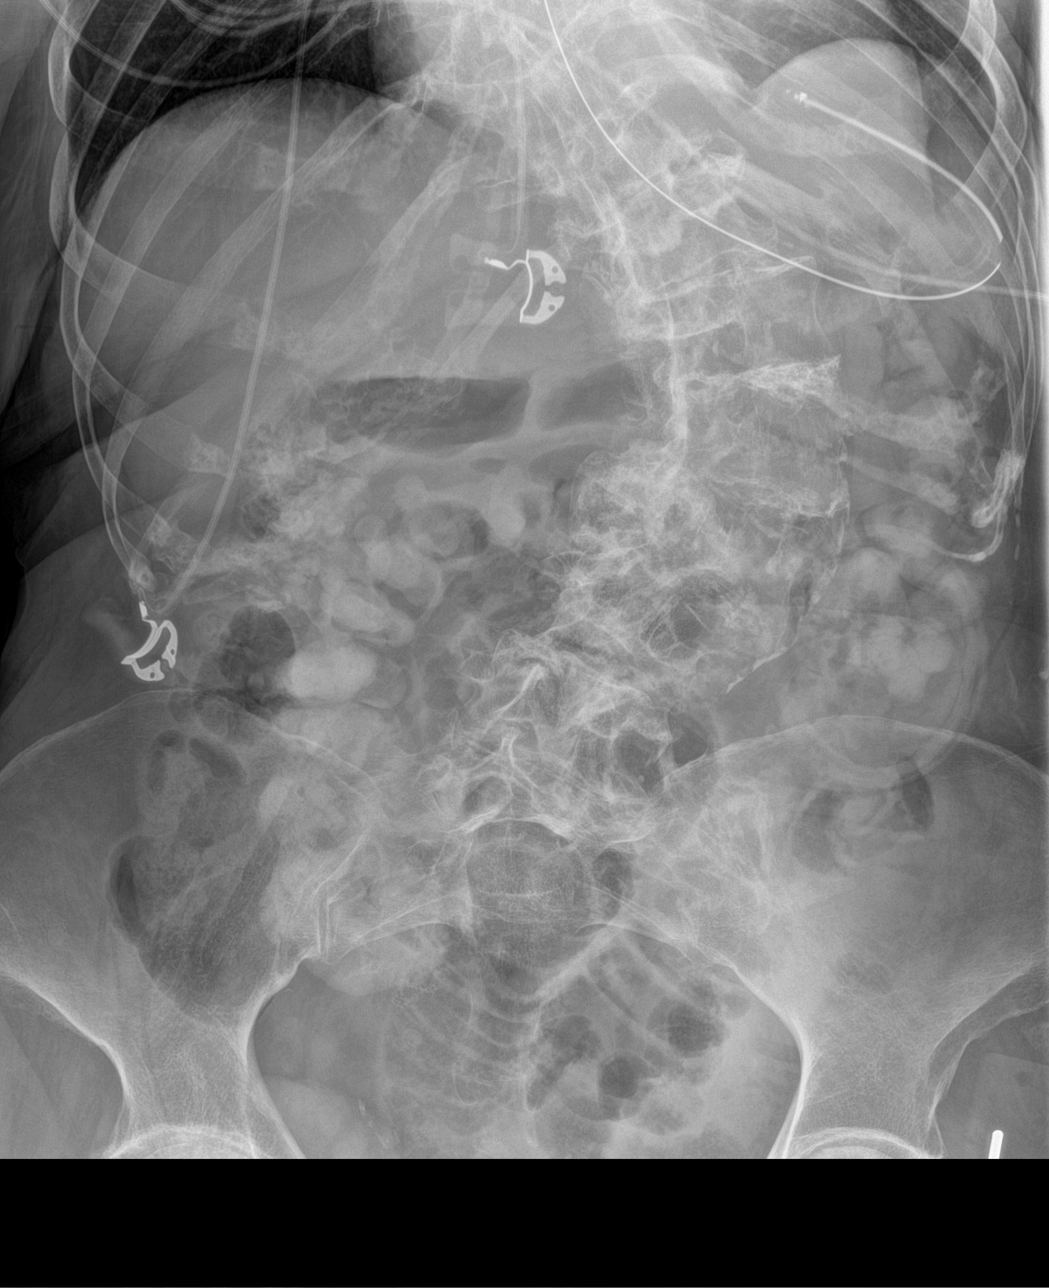

[chest ap]
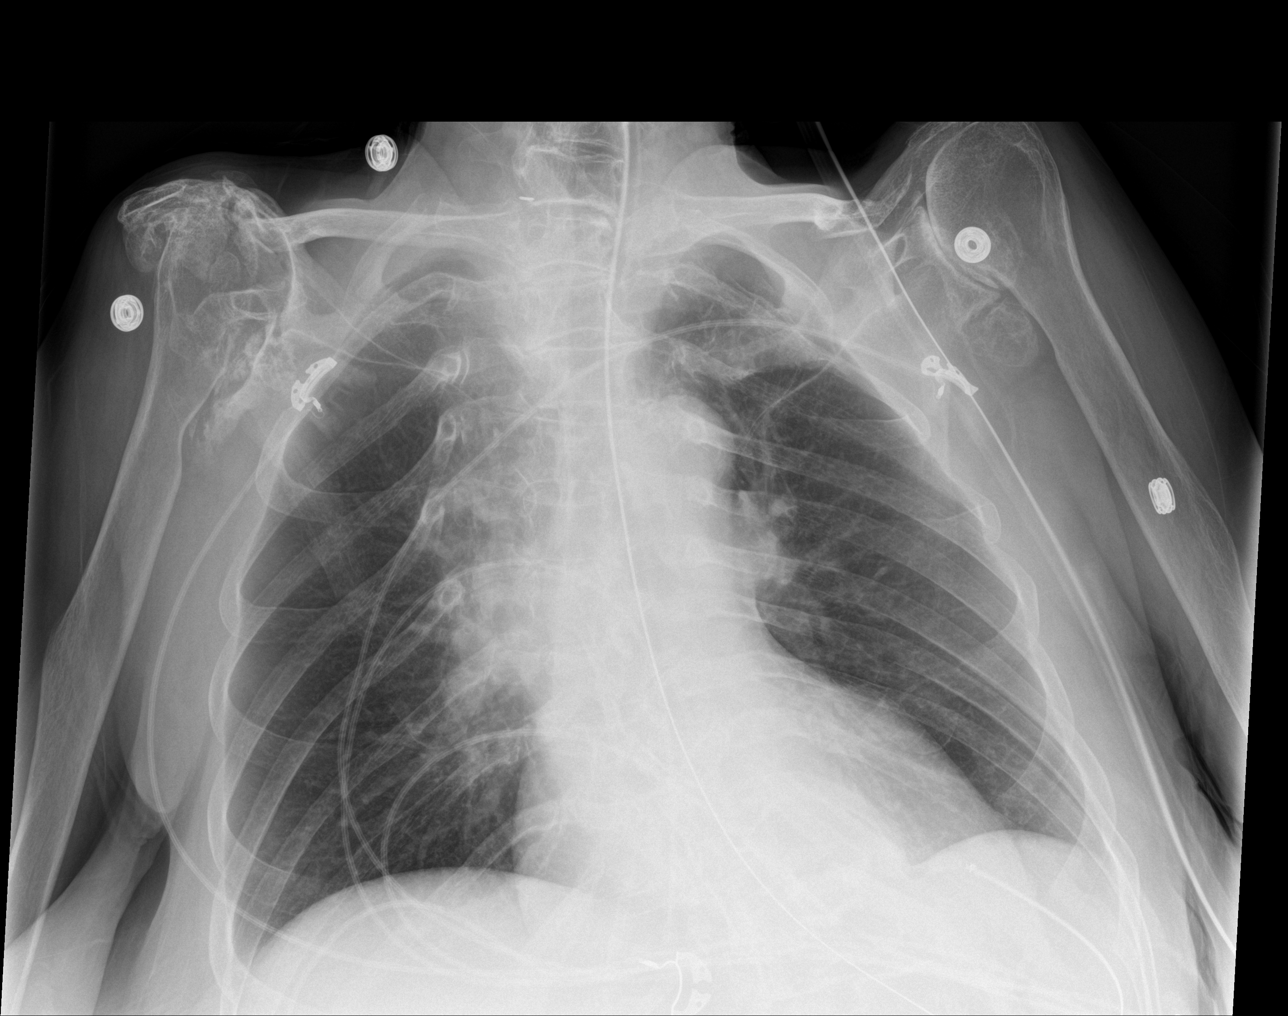

[3 of 3 positions shown; findings below may reference images not displayed]

FINDINGS: Nasogastric tube coiled in proximal stomach.

Borderline enlargement of cardiac silhouette.

Suspected hiatal hernia.

Mediastinal contours and pulmonary vascularity normal.

Lungs clear.

No pleural effusion or pneumothorax.

Ostomy LEFT lower quadrant.

Small amount of retained contrast.

Few dilated small bowel loops in pelvis out of proportion to colonic
diameter consistent with small bowel obstruction.

No bowel wall thickening or free intraperitoneal air.

Severe osseous demineralization with advanced degenerative
disc/facet disease changes of the thoracolumbar spine with
significant levoconvex scoliosis.

Degenerative changes BILATERAL hip joints.

BILATERAL glenohumeral degenerative changes, advanced on RIGHT with
destruction of the femoral head and glenoid, severe on LEFT with
large calcified loose body inferior to the glenoid.
IMPRESSION: Persistent small bowel obstruction.

Suspected hiatal hernia.

## 2015-12-24 IMAGING — CR DG ABDOMEN 1V
1 series · 1 of 1 positions shown · non-contrast
Comparison: 11/24/2014

CLINICAL DATA: Check nasogastric catheter placement

EXAM:
ABDOMEN - 1 VIEW

[abdomen kub]
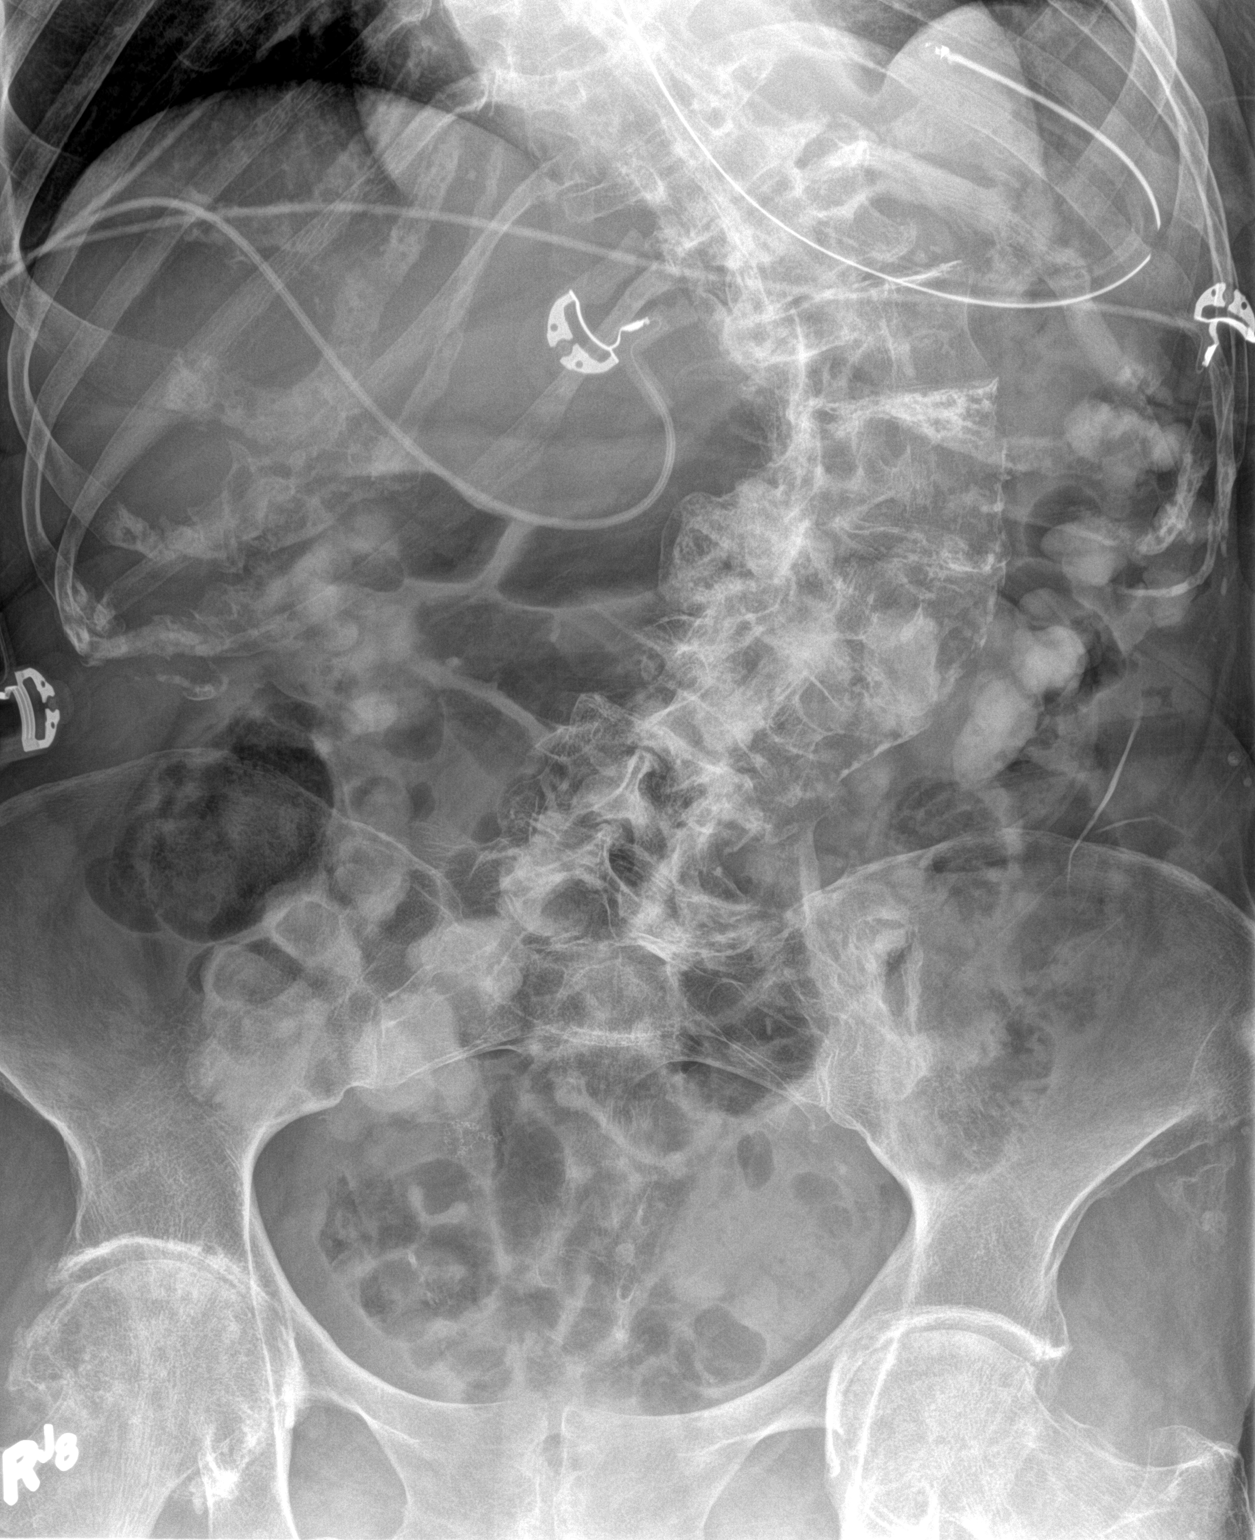

[1 of 1 positions shown; findings below may reference images not displayed]

FINDINGS: Scattered large and small bowel gas is noted. The degree of small
bowel dilatation has resolved in the interval. Contrast material
from prior CT examination is again seen. A nasogastric catheter is
again noted coiled within the stomach. Significant scoliosis concave
to the right is noted with associated degenerative changes of the
lumbar spine and hip joints bilaterally.
IMPRESSION: Resolution of previously seen small bowel dilatation. The
nasogastric catheter is again noted within the stomach.

## 2015-12-26 ENCOUNTER — Encounter: Payer: Self-pay | Admitting: Family Medicine

## 2015-12-26 ENCOUNTER — Ambulatory Visit (INDEPENDENT_AMBULATORY_CARE_PROVIDER_SITE_OTHER): Payer: Medicare Other | Admitting: Family Medicine

## 2015-12-26 VITALS — BP 120/80 | HR 77 | Temp 98.2°F

## 2015-12-26 DIAGNOSIS — I5022 Chronic systolic (congestive) heart failure: Secondary | ICD-10-CM

## 2015-12-26 DIAGNOSIS — H8110 Benign paroxysmal vertigo, unspecified ear: Secondary | ICD-10-CM

## 2015-12-26 MED ORDER — DIAZEPAM 2 MG PO TABS: 2.0000 mg | ORAL_TABLET | Freq: Two times a day (BID) | ORAL | Status: DC | PRN

## 2015-12-26 NOTE — Progress Notes (Signed)
Pre visit review using our clinic review tool, if applicable. No additional management support is needed unless otherwise documented below in the visit note. Pt unable to stand and weigh.   

## 2015-12-26 NOTE — Progress Notes (Signed)
   Subjective:    Patient ID: [name], female    DOB: 01/11/1956, 60 y.o.   MRN: [id number]  HPI Here to follow up an ER visit on 12-17-15 for dizziness. This had been going on for about a week. She described it as coming on when she moved her head quickly or got up out of bed. Her exam, labs, EKG, and head CT were all unremarkable. She was given some IV fluids and Meclizine and she felt better. She has continued to have these dizzy spells since then however, and she cannot tolerate the Meclizine because it makes her "feel crazy". She admits to drinking coffee all day and never drinking water. She takes a Zyrtec daily.    Review of Systems  Respiratory: Negative.   Cardiovascular: Negative.   Neurological: Positive for dizziness and weakness. Negative for seizures, speech difficulty, light-headedness and headaches.       Objective:   Physical Exam  Constitutional: She is oriented to person, place, and time. She appears well-developed and well-nourished. No distress.  [name]  Cardiovascular: Normal rate, regular rhythm, normal heart sounds and intact distal pulses.   Pulmonary/Chest: Effort normal and breath sounds normal.  Neurological: She is alert and oriented to person, place, and time.          Assessment & Plan:  Vertigo. First I advised her to drink plenty of water every day. Also instead of Meclizine she will try Valium 2 mg bid for a few weeks. I spent a total of 40 minutes with her today both in reviewing ER notes and test results and in face to face discussion.  [name], MD

## 2016-01-02 DIAGNOSIS — Z933 Colostomy status: Secondary | ICD-10-CM | POA: Diagnosis not present

## 2016-01-03 ENCOUNTER — Ambulatory Visit (INDEPENDENT_AMBULATORY_CARE_PROVIDER_SITE_OTHER): Payer: Medicare Other | Admitting: Certified Nurse Midwife

## 2016-01-03 ENCOUNTER — Encounter: Payer: Self-pay | Admitting: Certified Nurse Midwife

## 2016-01-03 VITALS — BP 124/80 | HR 70 | Resp 16

## 2016-01-03 DIAGNOSIS — Z01419 Encounter for gynecological examination (general) (routine) without abnormal findings: Secondary | ICD-10-CM | POA: Diagnosis not present

## 2016-01-03 DIAGNOSIS — Z124 Encounter for screening for malignant neoplasm of cervix: Secondary | ICD-10-CM | POA: Diagnosis not present

## 2016-01-03 DIAGNOSIS — R87612 Low grade squamous intraepithelial lesion on cytologic smear of cervix (LGSIL): Secondary | ICD-10-CM | POA: Diagnosis not present

## 2016-01-03 NOTE — Progress Notes (Signed)
60 y.o. G1P1001 Single  African American Fe here for annual exam. Menopausal no HRT. Denies vaginal bleeding per her aide who stays with her or her daughter. Daughter present for conversation only and management per patient request verbally. Denies vaginal dryness and has not been sexually active since colectomy/ostomy for perforation due to diverticulitis. Has developed large abdominal hernia since surgery. Patient has appointment in [place] for evaluation of repair and reconnection of colon. Moving less easily with RA and osteoporosis and life long blindness.Mammie Lorenzo is progressive. PCP manages Hypertension and anemia, labs. Hearing aids same. NO gout flares. Totally in wheelchair now due difficulty with legs giving way and spine issues. Both hands RA affected with abnormal appearance.Has stopped smoking now. Desires no labs today. Sad due to her aide of 17 years is not able to care for her now. "trying to get used to the young one". Daughter helps with all appointments and care as needed. Eating fair and drinking fluids daily. No other health issues today.  Patient's last menstrual period was 06/25/2006 (lmp unknown).          Sexually active: No.  The current method of family planning is post menopausal status.    Exercising: Yes.    chair exercises Smoker:  no  Health Maintenance: Pap:  06-07-14 LGSIL, HPV HR neg MMG:  09-12-15 category a density,birads 1:neg Colonoscopy:  2003 BMD:   3/17 TDaP:  2012 Shingles: no Pneumonia: 2017 Hep C and HIV: unsure Labs: none Self breast exam: done occ   reports that she quit smoking about 3 years ago. Her smoking use included Cigarettes. She smoked 1.00 pack per day. She has never used smokeless tobacco. She reports that she does not drink alcohol or use illicit drugs.  Past Medical History  Diagnosis Date  . Legally blind     since pt was a teenager  . Hearing aid worn     pt wears bilateral hearing aids  . Marfan's syndrome affecting skin      with scolosis  . Osteoporosis   . Hypertension   . 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 Hernia 4/08  . 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 Chronic gout 2008  . Stroke ([place]) 2/08  . Fibroid   . Status post bunionectomy 1/10    bilateral   . Diverticulitis of large intestine with perforation   . Small bowel obstruction due to adhesions ([place])   . Rheumatoid arthritis [facility])     sees Dr. Gavin Pound   . SBO (small bowel obstruction) ([place]) 01/05/2013  . Atrial flutter [facility])     Past Surgical History  Procedure Laterality Date  . Bunionectomy Bilateral 1/10  . Total knee arthroplasty Bilateral 6/08  . Hernia repair    . Laparotomy N/A 11/27/2012    Procedure: EXPLORATORY LAPAROTOMY SIGMOID COLECTOMY, COLOSTOMY;  Surgeon: Madilyn Hook, DO;  Location: [facility] ORS;  Service: General;  Laterality: N/A;  . Colostomy  11/27/2012  . Laparoscopic abdominal exploration N/A 01/06/2013    Procedure: LAPAROSCOPIC ABDOMINAL EXPLORATION;  Surgeon: Adin Hector, MD;  Location: [facility] ORS;  Service: General;  Laterality: N/A;  . Laparoscopic lysis of adhesions N/A 01/06/2013    Procedure: LAPAROSCOPIC LYSIS OF ADHESIONS/ INTEROTOMY REPAIR;  Surgeon: Adin Hector, MD;  Location: [facility];  Service: General;  Laterality: N/A;  . Colon surgery      colectomy  . Ablation  01/04/14  atrial flutter ablation (2 circuits) by Dr Lovena Le  . Atrial flutter ablation N/A 01/04/2014    Procedure: ATRIAL FLUTTER ABLATION;  Surgeon: Evans Lance, MD;  Location: [facility] CATH LAB;  Service: Cardiovascular;  Laterality: N/A;    Current Outpatient Prescriptions  Medication Sig Dispense Refill  . Abatacept (ORENCIA) 125 MG/ML SOSY Inject 1 application into the skin once a week.    Marland Kitchen albuterol (PROVENTIL HFA;VENTOLIN HFA) 108 (90 BASE) MCG/ACT inhaler Inhale 1 puff into the lungs every 4 (four) hours as needed for wheezing or shortness of breath. 1 Inhaler 11  . allopurinol (ZYLOPRIM) 100 MG  tablet Take 1 tablet (100 mg total) by mouth daily. 30 tablet 2  . amLODipine (NORVASC) 10 MG tablet Take 1 tablet (10 mg total) by mouth daily. 30 tablet 6  . aspirin EC 81 MG tablet Take 1 tablet (81 mg total) by mouth daily.    . Calcium Carbonate-Vitamin D (CALCIUM + D PO) Take 2,000 mg by mouth daily.     . cetirizine (ZYRTEC) 10 MG tablet Take 1 tablet (10 mg total) by mouth daily. 90 tablet 1  . ciprofloxacin-dexamethasone (CIPRODEX) otic suspension Place 4 drops into both ears 2 (two) times daily as needed. 7.5 mL 1  . diazepam (VALIUM) 2 MG tablet Take 1 tablet (2 mg total) by mouth every 12 (twelve) hours as needed (dizziness). 60 tablet 0  . docusate sodium (COLACE) 100 MG capsule Take 100 mg by mouth 2 (two) times daily.    . folic acid (FOLVITE) 1 MG tablet Take 1 mg by mouth daily.    Marland Kitchen HYDROcodone-acetaminophen (NORCO) 10-325 MG tablet Take 1 tablet by mouth every 6 (six) hours as needed for severe pain. 120 tablet 0  . ketoconazole (NIZORAL) 2 % cream Apply 1 application topically 2 (two) times daily. 30 g 5  . meclizine (ANTIVERT) 25 MG tablet Take 1 tablet (25 mg total) by mouth 3 (three) times daily as needed for dizziness. 30 tablet 0  . megestrol (MEGACE ES) 625 MG/5ML suspension Take 625 mg by mouth 2 (two) times daily.    . methotrexate 25 MG/ML injection Inject 40 mg/m2 into the skin once a week. Take on Mondays    . Multiple Vitamin (MULTIVITAMIN WITH MINERALS) TABS tablet Take 1 tablet by mouth daily.    Marland Kitchen NEXIUM 40 MG capsule Take 1 capsule (40 mg total) by mouth 2 (two) times daily. 60 capsule 5  . ondansetron (ZOFRAN) 4 MG tablet Take 1 tablet (4 mg total) by mouth every 8 (eight) hours as needed for nausea or vomiting. 12 tablet 0  . polyethylene glycol (MIRALAX / GLYCOLAX) packet Take 17 g by mouth daily. 14 each 11  . promethazine (PHENERGAN) 25 MG tablet Take 1 tablet (25 mg total) by mouth every 6 (six) hours as needed for nausea or vomiting. 30 tablet 0  .  risedronate (ACTONEL) 150 MG tablet Take 1 tablet (150 mg total) by mouth every 30 (thirty) days. with water on empty stomach, nothing by mouth or lie down for next 30 minutes. 4 tablet 11  . tiZANidine (ZANAFLEX) 4 MG tablet Take 1 tablet (4 mg total) by mouth 2 (two) times daily. 90 tablet 1  . traMADol (ULTRAM) 50 MG tablet take 1-2 tablets by mouth every 6 hours if needed 120 tablet 5  . vitamin C (ASCORBIC ACID) 500 MG tablet Take 1,000 mg by mouth daily.      No current facility-administered medications for this visit.  Family History  Problem Relation Age of Onset  . Heart attack Neg Hx     ROS:  Pertinent items are noted in HPI.  Otherwise, a comprehensive ROS was negative.  Exam:   BP 124/80 mmHg  Pulse 70  Resp 16  Ht   Wt   LMP 06/25/2006 (LMP Unknown)   Ht Readings from Last 3 Encounters:  06/20/15 5\' 8"  (1.727 m)  06/07/15 5\' 8"  (1.727 m)  11/22/14 5\' 8"  (1.727 m)    General appearance: alert, cooperative and appears stated age, does not ambulate in wheel chair now Head: Normocephalic, without obvious abnormality, atraumatic Neck: no adenopathy, supple, symmetrical, trachea midline and thyroid normal to inspection and palpation Lungs: clear to auscultation bilaterally Breasts: normal appearance, no masses or tenderness, No nipple retraction or dimpling, No nipple discharge or bleeding, No axillary or supraclavicular adenopathy, pendulous Heart: regular rate and rhythm Abdomen: soft, non-tender; no masses,  no organomegaly, ostomy bag attached to skin, large abdominal hernia present, no evidence of strangulation Extremities: extremities deformities of legs and hands noted due to RA and Marfans , no cyanosis or edema Skin: Skin color, texture, turgor normal. No rashes or lesions Lymph nodes: Cervical, supraclavicular, and axillary nodes normal. No abnormal inguinal nodes palpated Neurologic: Grossly normal   Pelvic: External genitalia:  no lesions, atrophic  appearance              Urethra:  normal appearing urethra with no masses, tenderness or lesions              Bartholin's and Skene's: normal                 Vagina: normal appearing vagina with normal color and discharge, no lesions, shortened smaller canal              Cervix: multiparous appearance, no cervical motion tenderness and no lesions              Pap taken: Yes.   Bimanual Exam:  Uterus:  normal size, contour, position, consistency, mobility, non-tender unable to palpate well due to abdominal hernia              Adnexa: no mass, fullness, tenderness unable to palpate will due to abdominal hernia               Rectovaginal: Confirms               Anus:  normal appearance  Chaperone present: yes  A:  Well Woman with normal exam  Menopausal no HRT  History of LSIL negative HPV in 2015 follow up pap today( patient did not have colposcopy due to health) MD aware  Marfan's Syndrome with scolosis, progressive with MD management  Blindness from teen years  RA with progressive deformities and limited ROM with MD management  Non ambulatory in wheel chair  Abdominal hernia unrepaired under follow up  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  Pap smear as above with HPV reflex  counseled on breast self exam, mammography screening, adequate intake of calcium and vitamin D(taking daily), diet and ROM as indicated per MD and her ability.  Questions addressed with patient and mother.  return annually or prn  An After Visit Summary was printed and given to the patient.

## 2016-01-03 NOTE — Patient Instructions (Signed)

## 2016-01-04 ENCOUNTER — Telehealth: Payer: Self-pay | Admitting: Family Medicine

## 2016-01-04 NOTE — Telephone Encounter (Signed)
Pt need new Rx for allopurinol   Pharm:  [facility]

## 2016-01-05 LAB — IPS PAP TEST WITH REFLEX TO HPV

## 2016-01-05 MED ORDER — ALLOPURINOL 100 MG PO TABS: 100.0000 mg | ORAL_TABLET | Freq: Every day | ORAL | Status: DC

## 2016-01-05 NOTE — Telephone Encounter (Signed)
I sent script e-scribe. 

## 2016-01-09 ENCOUNTER — Telehealth: Payer: Self-pay | Admitting: Family Medicine

## 2016-01-09 ENCOUNTER — Other Ambulatory Visit: Payer: Self-pay

## 2016-01-09 LAB — IPS HPV ON A LIQUID BASED SPECIMEN

## 2016-01-09 MED ORDER — TIZANIDINE HCL 4 MG PO TABS: 4.0000 mg | ORAL_TABLET | Freq: Two times a day (BID) | ORAL | Status: DC

## 2016-01-09 NOTE — Progress Notes (Signed)
Encounter reviewed Vinia Jemmott, MD   

## 2016-01-09 NOTE — Telephone Encounter (Signed)
Pt need new Rx for zanaflex 4 mg  [facility]

## 2016-01-10 ENCOUNTER — Other Ambulatory Visit: Payer: Self-pay | Admitting: Certified Nurse Midwife

## 2016-01-10 DIAGNOSIS — M069 Rheumatoid arthritis, unspecified: Secondary | ICD-10-CM | POA: Diagnosis not present

## 2016-01-10 DIAGNOSIS — R87612 Low grade squamous intraepithelial lesion on cytologic smear of cervix (LGSIL): Secondary | ICD-10-CM

## 2016-01-11 NOTE — Telephone Encounter (Signed)
Script was sent earlier today

## 2016-01-12 ENCOUNTER — Telehealth: Payer: Self-pay | Admitting: Emergency Medicine

## 2016-01-12 NOTE — Telephone Encounter (Signed)
Reviewed with Melvia Heaps CNM. Call to daughter to schedules appointments designated party release form.

## 2016-01-12 NOTE — Telephone Encounter (Signed)
-----   Message from Regina Eck, CNM sent at 01/10/2016  1:14 PM EDT ----- Notify patient that pap smear is LSIL and will need colposcopy. Please schedule. Order in.

## 2016-01-23 DIAGNOSIS — Z86010 Personal history of colonic polyps: Secondary | ICD-10-CM | POA: Diagnosis not present

## 2016-01-23 DIAGNOSIS — Z933 Colostomy status: Secondary | ICD-10-CM | POA: Diagnosis not present

## 2016-01-23 DIAGNOSIS — K439 Ventral hernia without obstruction or gangrene: Secondary | ICD-10-CM | POA: Diagnosis not present

## 2016-01-24 ENCOUNTER — Encounter: Payer: Self-pay | Admitting: Internal Medicine

## 2016-01-25 NOTE — Telephone Encounter (Signed)
Call to patient. She is given results and verbalized understanding.  Agreeable to scheduling colposcopy, requests Mondays or Tuesday. She will discuss this with her daughter and call back if she cannot make appointment as scheduled.   Patient notified of message from [place].  She is agreeable to scheduling colposcopy.  Brief description of procedure given to patient.  Colposcopy pre-procedure instructions given.  Make sure to eat a meal and hydrate before appointment. She will drink an ensure and take her ultram before appointment. She states she does not like to eat this early.   Patient verbalized understanding of preprocedure instructions and will call to reschedule if will be on menses or has any concerns regarding pregnancy.  Patient is advised she will be contacted with insurance coverage information.    Colposcopy is scheduled at this time for 02/07/16 at 1400.  Routing to provider for final review. Patient agreeable to disposition. Will close encounter.

## 2016-01-30 DIAGNOSIS — Z933 Colostomy status: Secondary | ICD-10-CM | POA: Diagnosis not present

## 2016-02-07 ENCOUNTER — Ambulatory Visit (INDEPENDENT_AMBULATORY_CARE_PROVIDER_SITE_OTHER): Payer: Medicare Other | Admitting: Certified Nurse Midwife

## 2016-02-07 ENCOUNTER — Encounter: Payer: Self-pay | Admitting: Certified Nurse Midwife

## 2016-02-07 DIAGNOSIS — N870 Mild cervical dysplasia: Secondary | ICD-10-CM | POA: Diagnosis not present

## 2016-02-07 DIAGNOSIS — R87612 Low grade squamous intraepithelial lesion on cytologic smear of cervix (LGSIL): Secondary | ICD-10-CM | POA: Diagnosis not present

## 2016-02-07 NOTE — Progress Notes (Signed)
01-03-16 LGSIL HPV HR neg 12/15 LGSIL HPV HR neg

## 2016-02-07 NOTE — Patient Instructions (Signed)
Colposcopy Post-procedure Instructions . Cramping is common.  You may take Ibuprofen, Aleve, or Tylenol for the cramping.  This should resolve within the next two to three days.   . You may have bright red spotting or blackish discharge for several days after your procedure.  The discharge occurs because of a topical solution used to stop bleeding at the biopsy site(s).  You should wear a mini pad for the next few days. . Refrain from putting anything in the vagina until the bleeding and/or discharge stops (usually less than a week). . You need to call the office if you have any pelvic pain, fever, heavy bleeding, or foul smelling vaginal discharge. . Shower or bathe as normal . You will be notified within one week of your biopsy results or we will discuss your results at your follow-up appointment if needed.  

## 2016-02-07 NOTE — Progress Notes (Addendum)
Patient ID: [name], female   DOB: [birth year], 60 y.o.   MRN: [id number]  Chief Complaint  Patient presents with  . Colposcopy    //jj    HPI [name] is a 60 y.o. female here for colposcopy exam. Denies vaginal bleeding or pelvic pain. Daughter with her. HPI  Indications: Pap smear on 01/03/16 showed: low-grade squamous intraepithelial neoplasia (LGSIL - encompassing HPV,mild dysplasia,CIN I). Previous colposcopy:none Prior cervical treatment: no treatment.  Past Medical History:  Diagnosis Date  . Atrial flutter ([place])   . Chronic gout 2008  . Diverticulitis of large intestine with perforation   . Fibroid   . Hearing aid worn    pt wears bilateral hearing aids  . Hernia 4/08  . Hypertension   . Legally blind    since pt was a teenager  . Marfan's syndrome affecting skin    with scolosis  . Osteoporosis   . Rheumatoid arthritis [facility])    sees Dr. Gavin Pound   . SBO (small bowel obstruction) ([place]) 01/05/2013  . Small bowel obstruction due to adhesions ([place])   . Status post bunionectomy 1/10   bilateral   . Stroke ([place]) 2/08  . 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 ABLATION  01/04/14   atrial flutter ablation (2 circuits) by Dr Lovena Le  . ATRIAL FLUTTER ABLATION N/A 01/04/2014   Procedure: ATRIAL FLUTTER ABLATION;  Surgeon: Evans Lance, MD;  Location: [facility] CATH LAB;  Service: Cardiovascular;  Laterality: N/A;  . BUNIONECTOMY Bilateral 1/10  . COLON SURGERY     colectomy  . COLOSTOMY  11/27/2012  . HERNIA REPAIR    . LAPAROSCOPIC ABDOMINAL EXPLORATION N/A 01/06/2013   Procedure: LAPAROSCOPIC ABDOMINAL EXPLORATION;  Surgeon: Adin Hector, MD;  Location: [facility];  Service: General;  Laterality: N/A;  . LAPAROSCOPIC LYSIS OF ADHESIONS N/A 01/06/2013   Procedure: LAPAROSCOPIC LYSIS OF ADHESIONS/ INTEROTOMY REPAIR;  Surgeon: Adin Hector,  MD;  Location: [facility]S;  Service: General;  Laterality: N/A;  . LAPAROTOMY N/A 11/27/2012   Procedure: EXPLORATORY LAPAROTOMY SIGMOID COLECTOMY, COLOSTOMY;  Surgeon: Madilyn Hook, DO;  Location: [facility];  Service: General;  Laterality: N/A;  . TOTAL KNEE ARTHROPLASTY Bilateral 6/08    Family History  Problem Relation Age of Onset  . Heart attack Neg Hx     Social History Social History  Substance Use Topics  . Smoking status: Former Smoker    Packs/day: 1.00    Types: Cigarettes    Quit date: 11/27/2012  . Smokeless tobacco: Never Used  . Alcohol use No     Comment: recovering alcoholic sober since 0000000    No Known Allergies  Current Outpatient Prescriptions  Medication Sig Dispense Refill  . Abatacept (ORENCIA) 125 MG/ML SOSY Inject 1 application into the skin once a week.    Marland Kitchen albuterol (PROVENTIL HFA;VENTOLIN HFA) 108 (90 BASE) MCG/ACT inhaler Inhale 1 puff into the lungs every 4 (four) hours as needed for wheezing or shortness of breath. 1 Inhaler 11  . allopurinol (ZYLOPRIM) 100 MG tablet Take 1 tablet (100 mg total) by mouth daily. 30 tablet 3  . amLODipine (NORVASC) 10 MG tablet Take 1 tablet (10 mg total) by mouth daily. 30 tablet 6  . aspirin EC 81 MG tablet Take 1 tablet (81 mg total) by mouth daily.    Marland Kitchen  Calcium Carbonate-Vitamin D (CALCIUM + D PO) Take 2,000 mg by mouth daily.     . cetirizine (ZYRTEC) 10 MG tablet Take 1 tablet (10 mg total) by mouth daily. 90 tablet 1  . ciprofloxacin-dexamethasone (CIPRODEX) otic suspension Place 4 drops into both ears 2 (two) times daily as needed. 7.5 mL 1  . diazepam (VALIUM) 2 MG tablet Take 1 tablet (2 mg total) by mouth every 12 (twelve) hours as needed (dizziness). 60 tablet 0  . docusate sodium (COLACE) 100 MG capsule Take 100 mg by mouth 2 (two) times daily.    . folic acid (FOLVITE) 1 MG tablet Take 1 mg by mouth daily.    Marland Kitchen HYDROcodone-acetaminophen (NORCO) 10-325 MG tablet Take 1 tablet by mouth every 6 (six) hours as needed  for severe pain. 120 tablet 0  . ketoconazole (NIZORAL) 2 % cream Apply 1 application topically 2 (two) times daily. 30 g 5  . meclizine (ANTIVERT) 25 MG tablet Take 1 tablet (25 mg total) by mouth 3 (three) times daily as needed for dizziness. 30 tablet 0  . megestrol (MEGACE ES) 625 MG/5ML suspension Take 625 mg by mouth 2 (two) times daily.    . methotrexate 25 MG/ML injection Inject 40 mg/m2 into the skin once a week. Take on Mondays    . Multiple Vitamin (MULTIVITAMIN WITH MINERALS) TABS tablet Take 1 tablet by mouth daily.    Marland Kitchen NEXIUM 40 MG capsule Take 1 capsule (40 mg total) by mouth 2 (two) times daily. 60 capsule 5  . ondansetron (ZOFRAN) 4 MG tablet Take 1 tablet (4 mg total) by mouth every 8 (eight) hours as needed for nausea or vomiting. 12 tablet 0  . polyethylene glycol (MIRALAX / GLYCOLAX) packet Take 17 g by mouth daily. 14 each 11  . promethazine (PHENERGAN) 25 MG tablet Take 1 tablet (25 mg total) by mouth every 6 (six) hours as needed for nausea or vomiting. 30 tablet 0  . risedronate (ACTONEL) 150 MG tablet Take 1 tablet (150 mg total) by mouth every 30 (thirty) days. with water on empty stomach, nothing by mouth or lie down for next 30 minutes. 4 tablet 11  . tiZANidine (ZANAFLEX) 4 MG tablet Take 1 tablet (4 mg total) by mouth 2 (two) times daily. 90 tablet 1  . traMADol (ULTRAM) 50 MG tablet take 1-2 tablets by mouth every 6 hours if needed 120 tablet 5  . vitamin C (ASCORBIC ACID) 500 MG tablet Take 1,000 mg by mouth daily.      No current facility-administered medications for this visit.     Review of Systems Review of Systems  Constitutional: Negative.   Gastrointestinal: Negative for abdominal pain.  Genitourinary: Negative for vaginal bleeding, vaginal discharge and vaginal pain.    Blood pressure 120/74, pulse 70, resp. rate 16, last menstrual period 06/25/2006.  Physical Exam Physical Exam  Constitutional: She is oriented to person, place, and time. She  appears well-developed and well-nourished.  Genitourinary: There is no rash, tenderness or lesion on the right labia. There is no rash, tenderness or lesion on the left labia. No tenderness in the vagina. No vaginal discharge found.    Genitourinary Comments: Vagina atrophic  Musculoskeletal:  Patient has Marfan's syndrome and has feet and hand deformities.  Neurological: She is alert and oriented to person, place, and time.  Skin: Skin is warm and dry.  Psychiatric: She has a normal mood and affect. Her behavior is normal. Judgment and thought content normal.  Data Reviewed Reviewed pap result with patient and daughter(ROI) on file. Consent read to patient by CMA and signed. Patient is legally blind some vision. Questions addressed.  Assessment    Procedure Details  The risks and benefits of the procedure and Written informed consent obtained. Patient was assisted into position comfortable for her by CMA's.  Speculum placed in vagina and excellent visualization of cervix achieved, cervix swabbed x 3 with saline and acetic acid solution. Cervix viewed with 3.75, 7.5 and 15# green filter, no lesions noted. Lugol's applied, no area  non staining noted. ECC taken. Monsel's applied. No active bleeding noted on speculum removal. Patient tolerated well. Instructions given to patient and daughter.  Specimens: 1  Complications: none.     Plan    Specimens labelled and sent to Pathology. Patient will be called with results when reviewed.. Pathology reviewed ECC showed LSIL with mild dysplasia and HPV effect. Patient will be notified of results and need to follow up pap smear in one year. Pap recall placed      [facility] 02/07/2016, 2:34 PM

## 2016-02-08 ENCOUNTER — Telehealth: Payer: Self-pay | Admitting: Family Medicine

## 2016-02-08 NOTE — Telephone Encounter (Signed)
Pt need new Hydrocodone  Pt would like to know if you received any information from the [employer] program for a lift chair.  Contact person [place] at [phone]

## 2016-02-09 LAB — IPS OTHER TISSUE BIOPSY

## 2016-02-10 ENCOUNTER — Telehealth: Payer: Self-pay

## 2016-02-10 MED ORDER — HYDROCODONE-ACETAMINOPHEN 10-325 MG PO TABS: 1.0000 | ORAL_TABLET | Freq: Four times a day (QID) | ORAL | 0 refills | Status: DC | PRN

## 2016-02-10 NOTE — Telephone Encounter (Signed)
Called patient at [phone] to discuss pap results. Left message to call me back.

## 2016-02-10 NOTE — Telephone Encounter (Signed)
Script is ready for pick up here at front office, pt's daughter will request paper for lift to be faxed over to our office.

## 2016-02-10 NOTE — Telephone Encounter (Signed)
Her rx are ready. No I have not heard anything about a lift chair.

## 2016-02-10 NOTE — Telephone Encounter (Signed)
Spoke with patient and notified of pap results. She voices understanding. 08 recall entered.

## 2016-02-10 NOTE — Telephone Encounter (Signed)
-----   Message from Regina Eck, CNM sent at 02/10/2016  7:59 AM EDT ----- Notify patient that ECC showed LSIL with mild squamous dysplasia and HPV effect. She needs one year follow up with pap smear at this time only. Very important to follow up with this. Pap recall 08

## 2016-02-10 NOTE — Telephone Encounter (Signed)
Patient is returning a call to Amanda.

## 2016-02-11 NOTE — Progress Notes (Signed)
Encounter reviewed Christina Dunlevy, MD   

## 2016-02-13 DIAGNOSIS — Z79899 Other long term (current) drug therapy: Secondary | ICD-10-CM | POA: Diagnosis not present

## 2016-02-13 DIAGNOSIS — M1A09X0 Idiopathic chronic gout, multiple sites, without tophus (tophi): Secondary | ICD-10-CM | POA: Diagnosis not present

## 2016-02-13 DIAGNOSIS — M2550 Pain in unspecified joint: Secondary | ICD-10-CM | POA: Diagnosis not present

## 2016-02-13 DIAGNOSIS — M0579 Rheumatoid arthritis with rheumatoid factor of multiple sites without organ or systems involvement: Secondary | ICD-10-CM | POA: Diagnosis not present

## 2016-02-16 ENCOUNTER — Telehealth: Payer: Self-pay | Admitting: Certified Nurse Midwife

## 2016-02-16 NOTE — Telephone Encounter (Signed)
Is she sure it is from vagina, not rectum or urine. She is blind so her aide has to tell her when she notes changes. Any UTI symptoms she also has had a colectomy and has a huge ventral hernia.

## 2016-02-16 NOTE — Telephone Encounter (Signed)
Spoke with patient. Patient denies any urinary symptoms, lower back pain, fever, or chills. Denies rectal bleeding. Reports her daughter has checked her bleeding and reports this is vaginal bleeding. Advised if bleeding increases to having to change pad every hour due to soaking through or develops pain or any new symptoms she will need to be seen in the office for earlier evaluation. If this occurs when the office is closed she will need to be seen at local ER for evaluation. She is agreeable.  Cc: Dr.Jertson  Routing to provider for final review. Patient agreeable to disposition. Will close encounter.

## 2016-02-16 NOTE — Telephone Encounter (Signed)
Patient just started bleeding after procedure and has questions for nurse.

## 2016-02-16 NOTE — Telephone Encounter (Signed)
Because her bleeding started so long after her colposcopy, I think she should come in for an appointment. Please schedule that for early next week if possible.

## 2016-02-16 NOTE — Telephone Encounter (Signed)
Patient returned the nurses call at lunch. She left a message to call back.

## 2016-02-16 NOTE — Telephone Encounter (Signed)
Left message to call Kaitlyn at [phone]. 

## 2016-02-16 NOTE — Telephone Encounter (Signed)
Spoke with patient. Patient had a coloscopy on 02/07/2016 with Melvia Heaps CNM. Reports she has not had any bleeding following her procedure until today. Reports she woke up this morning and has had 3 episodes of bleeding that starts and stops. Reports it is heavier than a spotting. States that she is still having pain in her groin area which she discussed with Melvia Heaps CNM and was advised it is likely from dryness and irritation. "I do not think the bleeding is from that." Denies any fever, chills, or sharp pain. Advised she will need to be seen in the office for further evaluation. Offered appointment for today or tomorrow, but patient declines. Appointment scheduled for 02/20/2016 with Dr.Jertson as Melvia Heaps CNM will be out of the office. The patient is agreeable to date and time and to see another provider. Advised if bleeding increases to having to change pad every hour due to soaking through or develops pain or any new symptoms she will need to be seen in the office for earlier evaluation. If this occurs when the office is closed she will need to be seen at local ER for evaluation. She is agreeable.  Melvia Heaps CNM do you agree with recommendations?  Cc: Dr.Jertston

## 2016-02-16 NOTE — Telephone Encounter (Signed)
Patient has been scheduled for OV on 02/20/2016 at 11:30 am with Dr.Jertson. Please see previous note.

## 2016-02-18 ENCOUNTER — Telehealth: Payer: Self-pay | Admitting: Family Medicine

## 2016-02-20 ENCOUNTER — Ambulatory Visit (INDEPENDENT_AMBULATORY_CARE_PROVIDER_SITE_OTHER): Payer: Medicare Other | Admitting: Obstetrics and Gynecology

## 2016-02-20 ENCOUNTER — Encounter: Payer: Self-pay | Admitting: Obstetrics and Gynecology

## 2016-02-20 VITALS — BP 102/80 | HR 84 | Resp 16

## 2016-02-20 DIAGNOSIS — N72 Inflammatory disease of cervix uteri: Secondary | ICD-10-CM | POA: Diagnosis not present

## 2016-02-20 DIAGNOSIS — N762 Acute vulvitis: Secondary | ICD-10-CM

## 2016-02-20 DIAGNOSIS — N950 Postmenopausal bleeding: Secondary | ICD-10-CM

## 2016-02-20 MED ORDER — BETAMETHASONE VALERATE 0.1 % EX OINT: TOPICAL_OINTMENT | CUTANEOUS | 0 refills | Status: DC

## 2016-02-20 NOTE — Patient Instructions (Signed)
Endometrial Biopsy Post-procedure Instructions . Cramping is common.  You may take Ibuprofen, Aleve, or Tylenol for the cramping.  This should resolve within 24 hours.   . You may have a small amount of spotting.  You should wear a mini pad for the next few days. . You may have intercourse in 24 hours. . You need to call the office if you have any pelvic pain, fever, heavy bleeding, or foul smelling vaginal discharge. . Shower or bathe as normal . You will be notified within one week of your biopsy results or we will discuss your results at your follow-up appointment if needed.  Stop using the current cream you are putting on your bottom. I'm calling in a new steroid ointment that you can use 2 x a day for 1-2 weeks only. In between and after use of the ointment, you can use Vaseline to the area

## 2016-02-20 NOTE — Addendum Note (Signed)
Addended by: Dorothy Spark on: 02/20/2016 12:29 PM   Modules accepted: Orders

## 2016-02-20 NOTE — Progress Notes (Signed)
GYNECOLOGY  VISIT   HPI: 60 y.o.   Single  African American  female   [id number] with Patient's last menstrual period was 06/25/2006 (lmp unknown).   here c/o vaginal bleeding that started about 8 days after her coloscopy. All she had done at the colposcopy was an ECC which returned with CIN I. She is still bleeding. She is bleeding lightly, no abdominal pain. She is here with her daughter. She c/o irritation on her "bottom", she has been using an antifungal cream with steroids for 2 weeks, not going away.   GYNECOLOGIC HISTORY: Patient's last menstrual period was 06/25/2006 (lmp unknown). Contraception:postmenopause Menopausal hormone therapy: none        OB History    Gravida Para Term Preterm AB Living   1 1 1     1    SAB TAB Ectopic Multiple Live Births           1         Patient Active Problem List   Diagnosis Date Noted  . Chronic systolic CHF (congestive heart failure) ([place])   . Hyperglycemia   . Ventral hernia without obstruction or gangrene 06/06/2015  . Renal mass   . SBO (small bowel obstruction) ([place])   . Small bowel obstruction ([place]) 05/01/2014  . Pulmonary HTN ([place]) 01/01/2014  . Tachycardia 01/01/2014  . Acute renal failure ([place]) 01/01/2014  . Iron deficiency anemia 01/01/2014  . Atrial flutter ([place]) 12/31/2013  . CHF, acute ([place]) 12/31/2013  . Encounter for ostomy care education 11/30/2013  . Stoma dermatitis 11/30/2013  . Incisional hernia 11/30/2013  . Constipation, chronic 11/30/2013  . Charcot's joint of foot 07/07/2013  . Bilateral leg edema 04/20/2013  . 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 Rheumatoid arthritis ([place]) 06/07/2009  . THYROID NODULE 07/01/2007  . LUNG NODULE 07/01/2007  . Osteoporosis 05/07/2007  . 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 Blindness 04/12/2007  . HEMORRHOIDS, INTERNAL 04/12/2007  . 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 HYPERLIPIDEMIA 01/07/2007  . Essential hypertension  01/07/2007  . GERD 01/07/2007  . Marfan's syndrome 01/07/2007    Past Medical History:  Diagnosis Date  . Atrial flutter ([place])   . Chronic gout 2008  . Diverticulitis of large intestine with perforation   . Fibroid   . Hearing aid worn    pt wears bilateral hearing aids  . Hernia 4/08  . Hypertension   . Legally blind    since pt was a teenager  . Marfan's syndrome affecting skin    with scolosis  . Osteoporosis   . Rheumatoid arthritis [facility])    sees Dr. Gavin Pound   . SBO (small bowel obstruction) ([place]) 01/05/2013  . Small bowel obstruction due to adhesions ([place])   . Status post bunionectomy 1/10   bilateral   . Stroke ([place]) 2/08  . 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 ABLATION  01/04/14   atrial flutter ablation (2 circuits) by Dr Lovena Le  . ATRIAL FLUTTER ABLATION N/A 01/04/2014   Procedure: ATRIAL FLUTTER ABLATION;  Surgeon: Evans Lance, MD;  Location: [facility] CATH LAB;  Service: Cardiovascular;  Laterality: N/A;  . BUNIONECTOMY Bilateral 1/10  . COLON SURGERY     colectomy  . COLOSTOMY  11/27/2012  . HERNIA REPAIR    . LAPAROSCOPIC ABDOMINAL EXPLORATION N/A 01/06/2013  Procedure: LAPAROSCOPIC ABDOMINAL EXPLORATION;  Surgeon: Adin Hector, MD;  Location: [facility] ORS;  Service: General;  Laterality: N/A;  . LAPAROSCOPIC LYSIS OF ADHESIONS N/A 01/06/2013   Procedure: LAPAROSCOPIC LYSIS OF ADHESIONS/ INTEROTOMY REPAIR;  Surgeon: Adin Hector, MD;  Location: [facility] ORS;  Service: General;  Laterality: N/A;  . LAPAROTOMY N/A 11/27/2012   Procedure: EXPLORATORY LAPAROTOMY SIGMOID COLECTOMY, COLOSTOMY;  Surgeon: Madilyn Hook, DO;  Location: [facility] ORS;  Service: General;  Laterality: N/A;  . TOTAL KNEE ARTHROPLASTY Bilateral 6/08    Current Outpatient Prescriptions  Medication Sig Dispense Refill  . Abatacept (ORENCIA) 125 MG/ML SOSY Inject 1 application into  the skin once a week.    Marland Kitchen albuterol (PROVENTIL HFA;VENTOLIN HFA) 108 (90 BASE) MCG/ACT inhaler Inhale 1 puff into the lungs every 4 (four) hours as needed for wheezing or shortness of breath. 1 Inhaler 11  . allopurinol (ZYLOPRIM) 100 MG tablet Take 1 tablet (100 mg total) by mouth daily. 30 tablet 3  . amLODipine (NORVASC) 10 MG tablet Take 1 tablet (10 mg total) by mouth daily. 30 tablet 6  . aspirin EC 81 MG tablet Take 1 tablet (81 mg total) by mouth daily.    . Calcium Carbonate-Vitamin D (CALCIUM + D PO) Take 2,000 mg by mouth daily.     . cetirizine (ZYRTEC) 10 MG tablet Take 1 tablet (10 mg total) by mouth daily. 90 tablet 1  . ciprofloxacin-dexamethasone (CIPRODEX) otic suspension Place 4 drops into both ears 2 (two) times daily as needed. 7.5 mL 1  . diazepam (VALIUM) 2 MG tablet Take 1 tablet (2 mg total) by mouth every 12 (twelve) hours as needed (dizziness). 60 tablet 0  . docusate sodium (COLACE) 100 MG capsule Take 100 mg by mouth 2 (two) times daily.    . folic acid (FOLVITE) 1 MG tablet Take 1 mg by mouth daily.    Marland Kitchen HYDROcodone-acetaminophen (NORCO) 10-325 MG tablet Take 1 tablet by mouth every 6 (six) hours as needed for severe pain. 120 tablet 0  . ketoconazole (NIZORAL) 2 % cream Apply 1 application topically 2 (two) times daily. 30 g 5  . meclizine (ANTIVERT) 25 MG tablet Take 1 tablet (25 mg total) by mouth 3 (three) times daily as needed for dizziness. 30 tablet 0  . megestrol (MEGACE ES) 625 MG/5ML suspension Take 625 mg by mouth 2 (two) times daily.    . methotrexate 25 MG/ML injection Inject 40 mg/m2 into the skin once a week. Take on Mondays    . Multiple Vitamin (MULTIVITAMIN WITH MINERALS) TABS tablet Take 1 tablet by mouth daily.    Marland Kitchen NEXIUM 40 MG capsule Take 1 capsule (40 mg total) by mouth 2 (two) times daily. 60 capsule 5  . ondansetron (ZOFRAN) 4 MG tablet Take 1 tablet (4 mg total) by mouth every 8 (eight) hours as needed for nausea or vomiting. 12 tablet 0  .  polyethylene glycol (MIRALAX / GLYCOLAX) packet Take 17 g by mouth daily. 14 each 11  . promethazine (PHENERGAN) 25 MG tablet Take 1 tablet (25 mg total) by mouth every 6 (six) hours as needed for nausea or vomiting. 30 tablet 0  . risedronate (ACTONEL) 150 MG tablet Take 1 tablet (150 mg total) by mouth every 30 (thirty) days. with water on empty stomach, nothing by mouth or lie down for next 30 minutes. 4 tablet 11  . tiZANidine (ZANAFLEX) 4 MG tablet Take 1 tablet (4 mg total) by mouth 2 (two) times daily. [place]  tablet 1  . traMADol (ULTRAM) 50 MG tablet take 1-2 tablets by mouth every 6 hours if needed 120 tablet 5  . vitamin C (ASCORBIC ACID) 500 MG tablet Take 1,000 mg by mouth daily.      No current facility-administered medications for this visit.      ALLERGIES: Review of patient's allergies indicates no known allergies.  Family History  Problem Relation Age of Onset  . Heart attack Neg Hx     Social History   Social History  . Marital status: Single    Spouse name: N/A  . Number of children: N/A  . Years of education: N/A   Occupational History  . Not on file.   Social History Main Topics  . Smoking status: Former Smoker    Packs/day: 1.00    Types: Cigarettes    Quit date: 11/27/2012  . Smokeless tobacco: Never Used  . Alcohol use No     Comment: recovering alcoholic sober since 0000000  . Drug use: No  . Sexual activity: No   Other Topics Concern  . Not on file   Social History Narrative  . No narrative on file    Review of Systems  Constitutional: Negative.   HENT: Negative.   Eyes: Negative.   Respiratory: Negative.   Cardiovascular: Negative.   Gastrointestinal: Negative.   Genitourinary:       Vaginal bleeding  Musculoskeletal: Negative.   Skin: Negative.   Neurological: Negative.   Endo/Heme/Allergies: Negative.   Psychiatric/Behavioral: Negative.     PHYSICAL EXAMINATION:    BP 102/80 (BP Location: Right Arm, Patient Position: Sitting, Cuff  Size: Normal)   Pulse 84   Resp 16   LMP 06/25/2006 (LMP Unknown)     General appearance: alert, cooperative and appears stated age. She has deformed hands from arthritis and is blind Abdomen: she has a colostomy bag and a large ventral  Pelvic: External genitalia:  Erythema to her inner buttocks and lower vulva, no signs of topical yeast.               Urethra:  normal appearing urethra with no masses, tenderness or lesions              Bartholins and Skenes: normal                 Vagina: normal appearing vagina with normal color and discharge, no lesions. There is a small amount of blood in the vagina, coming from the cervix, foul odor.              Cervix: no lesions              Bimanual Exam:  Uterus:  normal size, contour, position, consistency, mobility, non-tender              Adnexa: no mass, fullness, tenderness   The risks of endometrial biopsy were reviewed and a consent was obtained.  A speculum was placed in the vagina and the cervix was cleansed with betadine. A tenaculum was placed on the cervix and the mini-pipelle was placed into the endometrial cavity. The uterus sounded to 7 cm. The endometrial biopsy was performed, the catheter filled with blood x 2. A 3rd pass was made with the standard pipelle, this also filled with blood. The patient was very uncomfortable with the procedure, no further biopsies were done. The tenaculum and speculum were removed. There were no complications.   Chaperone was present for exam.  ASSESSMENT Postmenopausal bleeding, large  amount of blood in her uterus Vulvar and buttock skin irritation (she has been using an antifungal cream without help)    PLAN Endometrial biopsy Wet prep probe Change to a steroid ointment, can use x 1-2 weeks Can use Vaseline externally   An After Visit Summary was printed and given to the patient.

## 2016-02-21 ENCOUNTER — Telehealth: Payer: Self-pay | Admitting: *Deleted

## 2016-02-21 LAB — WET PREP BY MOLECULAR PROBE
Candida species: NEGATIVE
Gardnerella vaginalis: POSITIVE — AB
Trichomonas vaginosis: NEGATIVE

## 2016-02-21 MED ORDER — METRONIDAZOLE 500 MG PO TABS: 500.0000 mg | ORAL_TABLET | Freq: Two times a day (BID) | ORAL | 0 refills | Status: DC

## 2016-02-21 NOTE — Telephone Encounter (Signed)
-----   Message from Salvadore Dom, MD sent at 02/21/2016  8:20 AM EDT ----- Please inform the patient that her vaginitis probe was + for BV and treat with flagyl (either oral or vaginal, her choice), no ETOH while on Flagyl.  Oral: Flagyl 500 mg BID x 7 days, or Vaginal: Metrogel, 1 applicator per vagina q day x 5 days.

## 2016-02-21 NOTE — Telephone Encounter (Signed)
I called in script for Ultram to [facility] and spoke with pt.

## 2016-02-21 NOTE — Telephone Encounter (Signed)
Flagyl sent into pharmacy/ patient aware of results -see results note-eh

## 2016-02-21 NOTE — Telephone Encounter (Signed)
Left message to call regarding lab results -eh 

## 2016-02-21 NOTE — Telephone Encounter (Signed)
Pt following up on refill request pt. Pt is almost out and will be going that way this afternoon.

## 2016-02-21 NOTE — Telephone Encounter (Signed)
Call in #120 with 5 rf 

## 2016-02-24 ENCOUNTER — Encounter: Payer: Medicare Other | Admitting: Internal Medicine

## 2016-02-24 ENCOUNTER — Other Ambulatory Visit: Payer: Self-pay

## 2016-02-24 DIAGNOSIS — N719 Inflammatory disease of uterus, unspecified: Secondary | ICD-10-CM

## 2016-02-24 MED ORDER — DOXYCYCLINE HYCLATE 100 MG PO CAPS: 100.0000 mg | ORAL_CAPSULE | Freq: Two times a day (BID) | ORAL | 0 refills | Status: DC

## 2016-02-24 NOTE — Telephone Encounter (Signed)
Spoke with patient. Advised of message and results as seen below from [place]. Patient is agreeable and verbalizes understanding. Patient requests PUS appointment on a Tuesday. Offered appointment for 02/28/2016, but patient declines due to schedule. Declines appointment for a Thursday. Appointment scheduled for 03/06/2016 at 12:30 pm with 1 pm consult with Dr.Jertson. She is agreeable to date and time. Rx for Doxycycline 100 mg BID x 10 days #20 0RF pending as there is a flag "Pharmacologic effects of Methotrexate may be increased by Tetracyclines. Elevated plasma concentrations may elicit gastrointestinal and hematologic toxicity." Routing to Dr.Jertson for review.

## 2016-02-24 NOTE — Telephone Encounter (Signed)
Left message to call Kaitlyn at [phone]. 

## 2016-02-24 NOTE — Telephone Encounter (Signed)
Per Dr.Jertson's request call to [facility]. Spoke with pharmacist who states that the patient is safe to take Doxycycline 100 mg bid with Methotrexate injections. States that Doxycycline would decrease the absorption of the Methotrexate if this was being taken by mouth, since this is an injection will not effect absorption.   Dr.Miller, okay to send in rx for Doxycycline 100 mg BID x 10 days at this time? Dr.Jertson out of the office this afternoon.

## 2016-02-24 NOTE — Telephone Encounter (Signed)
Rx for Doxycycline 100 mg BID x 10 days #20 0RF reviewed and signed by Dr.Miller.

## 2016-02-24 NOTE — Telephone Encounter (Signed)
-----   Message from Salvadore Dom, MD sent at 02/23/2016  5:30 PM EDT ----- The patients biopsy returned with inflammatory exudate, no clear endometrial cells. I passed the catheter into her uterus 3 times and each time it filled immediately with bloody fluid. She needs further evaluation. Given the inflammation I'm concerned she may have an endometritis, please call in doxycyline 100 mg BID x 10 days. Please also schedule her for an ultrasound with me next week. It is very likely she will need a hysteroscopy, D&C, but I would like more information first.

## 2016-02-24 NOTE — Telephone Encounter (Signed)
Patient returned call

## 2016-02-28 ENCOUNTER — Other Ambulatory Visit: Payer: Self-pay | Admitting: *Deleted

## 2016-02-28 DIAGNOSIS — N950 Postmenopausal bleeding: Secondary | ICD-10-CM

## 2016-02-29 DIAGNOSIS — Z933 Colostomy status: Secondary | ICD-10-CM | POA: Diagnosis not present

## 2016-03-01 ENCOUNTER — Telehealth: Payer: Self-pay | Admitting: Obstetrics and Gynecology

## 2016-03-01 NOTE — Telephone Encounter (Signed)
RN spoke with Dr. Talbert Nan personally. Per Dr. Talbert Nan, have patient call if any fever and plan to keep appointment for PUS scheduled for Tuesday 03/06/16.   Call to patient. Patient given message from Dr. Talbert Nan. Denies fever currently. Aware to call the office if she does begin to develop a fever or any heavy vaginal bleeding. Patient verbalized understanding.    Routing to provider for final review. Patient agreeable to disposition. Will close encounter.

## 2016-03-01 NOTE — Telephone Encounter (Signed)
Patient called and states she has been taking the prescribed medication for 5 days and is still bleeding. Patient also states she is having diarrhea and is concerned about whether she is taking medication correctly.  Routing to triage for review

## 2016-03-01 NOTE — Telephone Encounter (Signed)
Returned call to patient. Patient states she called today because "basically I'm still bleeding." Patient states she only has to change her pad once per day, but that the bleeding hasn't changed since she was in the office last Monday (08/28), "it just varies day to day." Patient states she completed the flagyl on Tuesday (09/05) and had diarrhea on Tuesday (09/05) and Wednesday (09/06). She states the diarrhea has "subsided for the moment." Is currently on day 6 of the doxycycline and states she has not been taking with food. RN advised this message would be sent to Dr. Talbert Nan for review, and our office would call with any additional recommendations. Patient agreeable.    Routing to provider for review.

## 2016-03-06 ENCOUNTER — Encounter: Payer: Self-pay | Admitting: Obstetrics and Gynecology

## 2016-03-06 ENCOUNTER — Ambulatory Visit (INDEPENDENT_AMBULATORY_CARE_PROVIDER_SITE_OTHER): Payer: Medicare Other | Admitting: Obstetrics and Gynecology

## 2016-03-06 ENCOUNTER — Ambulatory Visit (INDEPENDENT_AMBULATORY_CARE_PROVIDER_SITE_OTHER): Payer: Medicare Other

## 2016-03-06 VITALS — BP 142/80 | HR 76 | Resp 14

## 2016-03-06 DIAGNOSIS — N950 Postmenopausal bleeding: Secondary | ICD-10-CM

## 2016-03-06 DIAGNOSIS — R300 Dysuria: Secondary | ICD-10-CM | POA: Diagnosis not present

## 2016-03-06 DIAGNOSIS — N719 Inflammatory disease of uterus, unspecified: Secondary | ICD-10-CM

## 2016-03-06 LAB — CBC WITH DIFFERENTIAL/PLATELET
Basophils Absolute: 0 cells/uL (ref 0–200)
Basophils Relative: 0 %
Eosinophils Absolute: 100 cells/uL (ref 15–500)
Eosinophils Relative: 1 %
HCT: 35.8 % (ref 35.0–45.0)
Hemoglobin: 11.9 g/dL (ref 11.7–15.5)
Lymphocytes Relative: 19 %
Lymphs Abs: 1900 cells/uL (ref 850–3900)
MCH: 31 pg (ref 27.0–33.0)
MCHC: 33.2 g/dL (ref 32.0–36.0)
MCV: 93.2 fL (ref 80.0–100.0)
MPV: 9 fL (ref 7.5–12.5)
Monocytes Absolute: 500 cells/uL (ref 200–950)
Monocytes Relative: 5 %
Neutro Abs: 7500 cells/uL (ref 1500–7800)
Neutrophils Relative %: 75 %
Platelets: 339 10*3/uL (ref 140–400)
RBC: 3.84 MIL/uL (ref 3.80–5.10)
RDW: 15 % (ref 11.0–15.0)
WBC: 10 10*3/uL (ref 3.8–10.8)

## 2016-03-06 NOTE — Progress Notes (Signed)
GYNECOLOGY  VISIT   HPI: 60 y.o.   Single  African American  female   [id number] with Patient's last menstrual period was 06/25/2006 (lmp unknown).   here for f/u of PMP bleeding. She started with light bleeding around 8/22 (a week after a colposcopy, only ECC was done). She was seen here on  8/28, endometrial biopsy was done x 3 passes (bloody fluid every time). Pathology returned with an inflammatory exudate, no endometrial cells. She was treated with doxycyline for 10 days. She was also treated with Flagyl for BV.  She continues to have light bleeding, she denies abdominal pain or fevers.   She does c/o some discomfort with voiding off and on for the last few weeks. No real increased frequency or urgency to void.   GYNECOLOGIC HISTORY: Patient's last menstrual period was 06/25/2006 (lmp unknown). Contraception:postmenopauae Menopausal hormone therapy: none         OB History    Gravida Para Term Preterm AB Living   1 1 1     1    SAB TAB Ectopic Multiple Live Births           1         Patient Active Problem List   Diagnosis Date Noted  . Chronic systolic CHF (congestive heart failure) ([place])   . Hyperglycemia   . Ventral hernia without obstruction or gangrene 06/06/2015  . Renal mass   . SBO (small bowel obstruction) ([place])   . Small bowel obstruction ([place]) 05/01/2014  . Pulmonary HTN ([place]) 01/01/2014  . Tachycardia 01/01/2014  . Acute renal failure ([place]) 01/01/2014  . Iron deficiency anemia 01/01/2014  . Atrial flutter ([place]) 12/31/2013  . CHF, acute ([place]) 12/31/2013  . Encounter for ostomy care education 11/30/2013  . Stoma dermatitis 11/30/2013  . Incisional hernia 11/30/2013  . Constipation, chronic 11/30/2013  . Charcot's joint of foot 07/07/2013  . Bilateral leg edema 04/20/2013  . 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 Rheumatoid arthritis ([place]) 06/07/2009  . THYROID NODULE 07/01/2007  . LUNG NODULE 07/01/2007  .  Osteoporosis 05/07/2007  . 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 Blindness 04/12/2007  . HEMORRHOIDS, INTERNAL 04/12/2007  . 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 HYPERLIPIDEMIA 01/07/2007  . Essential hypertension 01/07/2007  . GERD 01/07/2007  . Marfan's syndrome 01/07/2007    Past Medical History:  Diagnosis Date  . Atrial flutter ([place])   . Chronic gout 2008  . Diverticulitis of large intestine with perforation   . Fibroid   . Hearing aid worn    pt wears bilateral hearing aids  . Hernia 4/08  . Hypertension   . Legally blind    since pt was a teenager  . Marfan's syndrome affecting skin    with scolosis  . Osteoporosis   . Rheumatoid arthritis [facility])    sees Dr. Gavin Pound   . SBO (small bowel obstruction) ([place]) 01/05/2013  . Small bowel obstruction due to adhesions ([place])   . Status post bunionectomy 1/10   bilateral   . Stroke ([place]) 2/08  . 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 ABLATION  01/04/14   atrial flutter ablation (2 circuits) by Dr Lovena Le  . ATRIAL FLUTTER ABLATION N/A 01/04/2014   Procedure: ATRIAL FLUTTER ABLATION;  Surgeon: Evans Lance, MD;  Location: [facility]  CATH LAB;  Service: Cardiovascular;  Laterality: N/A;  . BUNIONECTOMY Bilateral 1/10  . COLON SURGERY     colectomy  . COLOSTOMY  11/27/2012  . HERNIA REPAIR    . LAPAROSCOPIC ABDOMINAL EXPLORATION N/A 01/06/2013   Procedure: LAPAROSCOPIC ABDOMINAL EXPLORATION;  Surgeon: Adin Hector, MD;  Location: [facility] ORS;  Service: General;  Laterality: N/A;  . LAPAROSCOPIC LYSIS OF ADHESIONS N/A 01/06/2013   Procedure: LAPAROSCOPIC LYSIS OF ADHESIONS/ INTEROTOMY REPAIR;  Surgeon: Adin Hector, MD;  Location: [facility] ORS;  Service: General;  Laterality: N/A;  . LAPAROTOMY N/A 11/27/2012   Procedure: EXPLORATORY LAPAROTOMY SIGMOID COLECTOMY, COLOSTOMY;  Surgeon: Madilyn Hook, DO;  Location: [facility]  ORS;  Service: General;  Laterality: N/A;  . TOTAL KNEE ARTHROPLASTY Bilateral 6/08    Current Outpatient Prescriptions  Medication Sig Dispense Refill  . Abatacept (ORENCIA) 125 MG/ML SOSY Inject 1 application into the skin once a week.    Marland Kitchen albuterol (PROVENTIL HFA;VENTOLIN HFA) 108 (90 BASE) MCG/ACT inhaler Inhale 1 puff into the lungs every 4 (four) hours as needed for wheezing or shortness of breath. 1 Inhaler 11  . allopurinol (ZYLOPRIM) 100 MG tablet Take 1 tablet (100 mg total) by mouth daily. 30 tablet 3  . amLODipine (NORVASC) 10 MG tablet Take 1 tablet (10 mg total) by mouth daily. 30 tablet 6  . aspirin EC 81 MG tablet Take 1 tablet (81 mg total) by mouth daily.    . betamethasone valerate ointment (VALISONE) 0.1 % Apply a small amount topically 2 x a day for 1-2 weeks 30 g 0  . Calcium Carbonate-Vitamin D (CALCIUM + D PO) Take 2,000 mg by mouth daily.     . cetirizine (ZYRTEC) 10 MG tablet Take 1 tablet (10 mg total) by mouth daily. 90 tablet 1  . ciprofloxacin-dexamethasone (CIPRODEX) otic suspension Place 4 drops into both ears 2 (two) times daily as needed. 7.5 mL 1  . diazepam (VALIUM) 2 MG tablet Take 1 tablet (2 mg total) by mouth every 12 (twelve) hours as needed (dizziness). 60 tablet 0  . docusate sodium (COLACE) 100 MG capsule Take 100 mg by mouth 2 (two) times daily.    Marland Kitchen doxycycline (VIBRAMYCIN) 100 MG capsule Take 1 capsule (100 mg total) by mouth 2 (two) times daily. 20 capsule 0  . folic acid (FOLVITE) 1 MG tablet Take 1 mg by mouth daily.    Marland Kitchen HYDROcodone-acetaminophen (NORCO) 10-325 MG tablet Take 1 tablet by mouth every 6 (six) hours as needed for severe pain. 120 tablet 0  . ketoconazole (NIZORAL) 2 % cream Apply 1 application topically 2 (two) times daily. 30 g 5  . meclizine (ANTIVERT) 25 MG tablet Take 1 tablet (25 mg total) by mouth 3 (three) times daily as needed for dizziness. 30 tablet 0  . megestrol (MEGACE ES) 625 MG/5ML suspension Take 625 mg by mouth 2  (two) times daily.    . methotrexate 25 MG/ML injection Inject 40 mg/m2 into the skin once a week. Take on Mondays    . metroNIDAZOLE (FLAGYL) 500 MG tablet Take 1 tablet (500 mg total) by mouth 2 (two) times daily. 14 tablet 0  . Multiple Vitamin (MULTIVITAMIN WITH MINERALS) TABS tablet Take 1 tablet by mouth daily.    Marland Kitchen NEXIUM 40 MG capsule Take 1 capsule (40 mg total) by mouth 2 (two) times daily. 60 capsule 5  . ondansetron (ZOFRAN) 4 MG tablet Take 1 tablet (4 mg total) by mouth every 8 (eight) hours as needed  for nausea or vomiting. 12 tablet 0  . polyethylene glycol (MIRALAX / GLYCOLAX) packet Take 17 g by mouth daily. 14 each 11  . promethazine (PHENERGAN) 25 MG tablet Take 1 tablet (25 mg total) by mouth every 6 (six) hours as needed for nausea or vomiting. 30 tablet 0  . risedronate (ACTONEL) 150 MG tablet Take 1 tablet (150 mg total) by mouth every 30 (thirty) days. with water on empty stomach, nothing by mouth or lie down for next 30 minutes. 4 tablet 11  . tiZANidine (ZANAFLEX) 4 MG tablet Take 1 tablet (4 mg total) by mouth 2 (two) times daily. 90 tablet 1  . traMADol (ULTRAM) 50 MG tablet take 1-2 tablets by mouth every 6 hours if needed 120 tablet 5  . vitamin C (ASCORBIC ACID) 500 MG tablet Take 1,000 mg by mouth daily.      No current facility-administered medications for this visit.      ALLERGIES: Review of patient's allergies indicates no known allergies.  Family History  Problem Relation Age of Onset  . Heart attack Neg Hx     Social History   Social History  . Marital status: Single    Spouse name: N/A  . Number of children: N/A  . Years of education: N/A   Occupational History  . Not on file.   Social History Main Topics  . Smoking status: Former Smoker    Packs/day: 1.00    Types: Cigarettes    Quit date: 11/27/2012  . Smokeless tobacco: Never Used  . Alcohol use No     Comment: recovering alcoholic sober since 0000000  . Drug use: No  . Sexual activity:  No   Other Topics Concern  . Not on file   Social History Narrative  . No narrative on file    Review of Systems  HENT: Negative.   Eyes: Negative.   Respiratory: Negative.   Cardiovascular: Negative.   Gastrointestinal: Negative.   Genitourinary: Positive for dysuria.       Postmenopause bleeding   Musculoskeletal: Negative.   Skin: Negative.   Neurological: Negative.   Endo/Heme/Allergies: Negative.   Psychiatric/Behavioral: Negative.     PHYSICAL EXAMINATION:    LMP 06/25/2006 (LMP Unknown)     General appearance: alert, cooperative and appears stated age Abdomen: soft, non-tender;large abdominal hernia, colostomy bag; no masses,  no organomegaly  Pelvic: External genitalia:  no lesions              Urethra:  normal appearing urethra with no masses, tenderness or lesions              Bartholins and Skenes: normal                 Vagina: normal appearing vagina with normal color and discharge, no lesions              Cervix: no lesions              Bimanual Exam:  Uterus:  exam limitied with her large ventral hernia, no masses, not tender              Adnexa: no mass, fullness, tenderness               Chaperone was present for exam.  Ultrasound: the uterus is filled with complex fluid.   The risks of endometrial curettage were reviewed and a consent was obtained.  A speculum was placed in the vagina and the cervix was cleansed with  betadine. A tenaculum was placed on the cervix and the uterine evacuator was inserted into the uterine cavity. The 12 cc syringe immediately filled with blood/clot. The syringe was emptied and an endometrial curettage was performed. Another 10 cc of fluid was obtained. The tenaculum and speculum were removed. There were no complications.   Post D&C ultrasound was performed and the cavity was empty with a thin endometrial stripe.    ASSESSMENT PMP bleeding, biopsy from last visit with inflammatory tissue, no endometrium. U/S today uterus  filled with complex fluid debris. She was treated for an endometritis after her last biopsy returned. She has no pain, no fevers, not tender on exam. Some dysuria, straight cath urine sent for evaluation (she would be unable to collect a ccua)    PLAN Endometrial curettage done, specimen to pathology and microbiology CBC with diff Urine for ua, c&s   An After Visit Summary was printed and given to the patient.  Over 20 minutes was spent face to face with the patient, over 50% in counseling

## 2016-03-07 LAB — URINALYSIS, MICROSCOPIC ONLY
Bacteria, UA: NONE SEEN [HPF]
Casts: NONE SEEN [LPF]
Crystals: NONE SEEN [HPF]
RBC / HPF: NONE SEEN RBC/HPF (ref <=2)
Squamous Epithelial / LPF: NONE SEEN [HPF] (ref <=5)
WBC, UA: NONE SEEN WBC/HPF (ref <=5)
Yeast: NONE SEEN [HPF]

## 2016-03-08 LAB — URINE CULTURE: Organism ID, Bacteria: NO GROWTH

## 2016-03-09 ENCOUNTER — Telehealth: Payer: Self-pay

## 2016-03-09 NOTE — Telephone Encounter (Signed)
Spoke with patient and advised her of her normal urine culture results. -sco

## 2016-03-09 NOTE — Telephone Encounter (Signed)
Left message for patient to call me back for lab results. -sco

## 2016-03-09 NOTE — Telephone Encounter (Signed)
-----   Message from Salvadore Dom, MD sent at 03/08/2016  7:03 PM EDT ----- Please advise the patient of normal results.

## 2016-03-10 LAB — WOUND CULTURE
Gram Stain: NONE SEEN
Organism ID, Bacteria: NO GROWTH

## 2016-03-12 ENCOUNTER — Telehealth: Payer: Self-pay | Admitting: *Deleted

## 2016-03-12 ENCOUNTER — Encounter (INDEPENDENT_AMBULATORY_CARE_PROVIDER_SITE_OTHER): Payer: Self-pay

## 2016-03-12 ENCOUNTER — Encounter: Payer: Self-pay | Admitting: Physician Assistant

## 2016-03-12 ENCOUNTER — Ambulatory Visit (INDEPENDENT_AMBULATORY_CARE_PROVIDER_SITE_OTHER): Payer: Medicare Other | Admitting: Physician Assistant

## 2016-03-12 VITALS — BP 128/78 | HR 80 | Ht 68.0 in | Wt 128.0 lb

## 2016-03-12 DIAGNOSIS — Z933 Colostomy status: Secondary | ICD-10-CM | POA: Diagnosis not present

## 2016-03-12 DIAGNOSIS — Z86010 Personal history of colonic polyps: Secondary | ICD-10-CM | POA: Diagnosis not present

## 2016-03-12 DIAGNOSIS — K439 Ventral hernia without obstruction or gangrene: Secondary | ICD-10-CM | POA: Diagnosis not present

## 2016-03-12 DIAGNOSIS — K5732 Diverticulitis of large intestine without perforation or abscess without bleeding: Secondary | ICD-10-CM | POA: Diagnosis not present

## 2016-03-12 NOTE — Telephone Encounter (Signed)
[name] for Dr. Sarajane Jews, that we saw this patient in the office and scheduled a colonoscopy for 10-10 at [facility].  Advised her Nicoletta Ba PA is sending Dr. Sarajane Jews a staff message with the approriate information about ordering a 2 D- Echo. The last Echo had severely decreased Ejection Fraction.

## 2016-03-12 NOTE — Progress Notes (Signed)
Subjective:    Patient ID: [name], female    DOB: 1956/06/11, 60 y.o.   MRN: [id number]  HPI [name] is a 60 year old African-American female known remotely to Dr. Carlean Purl from prior colonoscopy done in 2004 at which time she had left-sided diverticulosis and 2 hyperplastic polyps removed. Patient comes in today stating that she's referred by a surgeon that she saw recently at [facility] who she was sent to by Dr. Sammuel Bailiff [facility] for consideration of repair of a large ventral hernia and reversal of her colostomy. Neither the patient or her daughter can tell me the surgeon's name in [place]. Patient says that she has been symptomatic from the large ventral hernia but does not have any significant pain. She says she intermittently has some discomfort. Patient had undergone sigmoid colectomy and diverting end colostomy in June 2014 per Dr. Johney Maine for perforated diverticulitis. She has had a couple of admissions since then for small bowel obstructions . Patient has multiple other medical issues including legal blindness, remote CVA Marfan syndrome, rheumatoid arthritis from which she is debilitated and prior history of EtOH abuse which is been in remission since 1997. She also has history of congestive heart failure pulmonary hypertension and atrial flutter. Reviewing her records she had an echo done in 2015 showing an EF of 20% and has not had a repeat study since. Patient is unaware of any history of congestive heart failure.   Review of Systems Pertinent positive and negative review of systems were noted in the above HPI section.  All other review of systems was otherwise negative.  Outpatient Encounter Prescriptions as of 03/12/2016  Medication Sig  . Abatacept (ORENCIA) 125 MG/ML SOSY Inject 1 application into the skin once a week.  Marland Kitchen albuterol (PROVENTIL HFA;VENTOLIN HFA) 108 (90 BASE) MCG/ACT inhaler Inhale 1 puff into the lungs every 4 (four) hours  as needed for wheezing or shortness of breath.  . allopurinol (ZYLOPRIM) 100 MG tablet Take 1 tablet (100 mg total) by mouth daily.  Marland Kitchen amLODipine (NORVASC) 10 MG tablet Take 1 tablet (10 mg total) by mouth daily.  Marland Kitchen aspirin EC 81 MG tablet Take 1 tablet (81 mg total) by mouth daily.  . betamethasone valerate ointment (VALISONE) 0.1 % Apply a small amount topically 2 x a day for 1-2 weeks  . Calcium Carbonate-Vitamin D (CALCIUM + D PO) Take 2,000 mg by mouth daily.   . cetirizine (ZYRTEC) 10 MG tablet Take 1 tablet (10 mg total) by mouth daily.  . ciprofloxacin-dexamethasone (CIPRODEX) otic suspension Place 4 drops into both ears 2 (two) times daily as needed.  . diazepam (VALIUM) 2 MG tablet Take 1 tablet (2 mg total) by mouth every 12 (twelve) hours as needed (dizziness).  Marland Kitchen docusate sodium (COLACE) 100 MG capsule Take 100 mg by mouth 2 (two) times daily.  Marland Kitchen doxycycline (VIBRAMYCIN) 100 MG capsule Take 1 capsule (100 mg total) by mouth 2 (two) times daily.  . folic acid (FOLVITE) 1 MG tablet Take 1 mg by mouth daily.  Marland Kitchen HYDROcodone-acetaminophen (NORCO) 10-325 MG tablet Take 1 tablet by mouth every 6 (six) hours as needed for severe pain.  Marland Kitchen ketoconazole (NIZORAL) 2 % cream Apply 1 application topically 2 (two) times daily.  . meclizine (ANTIVERT) 25 MG tablet Take 1 tablet (25 mg total) by mouth 3 (three) times daily as needed for dizziness.  . megestrol (MEGACE ES) 625 MG/5ML suspension Take 625 mg by mouth 2 (two) times daily.  Marland Kitchen  methotrexate 25 MG/ML injection Inject 40 mg/m2 into the skin once a week. Take on Mondays  . metroNIDAZOLE (FLAGYL) 500 MG tablet Take 1 tablet (500 mg total) by mouth 2 (two) times daily.  . Multiple Vitamin (MULTIVITAMIN WITH MINERALS) TABS tablet Take 1 tablet by mouth daily.  Marland Kitchen NEXIUM 40 MG capsule Take 1 capsule (40 mg total) by mouth 2 (two) times daily.  . ondansetron (ZOFRAN) 4 MG tablet Take 1 tablet (4 mg total) by mouth every 8 (eight) hours as needed for  nausea or vomiting.  . polyethylene glycol (MIRALAX / GLYCOLAX) packet Take 17 g by mouth daily.  . promethazine (PHENERGAN) 25 MG tablet Take 1 tablet (25 mg total) by mouth every 6 (six) hours as needed for nausea or vomiting.  . risedronate (ACTONEL) 150 MG tablet Take 1 tablet (150 mg total) by mouth every 30 (thirty) days. with water on empty stomach, nothing by mouth or lie down for next 30 minutes.  Marland Kitchen tiZANidine (ZANAFLEX) 4 MG tablet Take 1 tablet (4 mg total) by mouth 2 (two) times daily.  . traMADol (ULTRAM) 50 MG tablet take 1-2 tablets by mouth every 6 hours if needed  . vitamin C (ASCORBIC ACID) 500 MG tablet Take 1,000 mg by mouth daily.    No facility-administered encounter medications on file as of 03/12/2016.    No Known Allergies Patient Active Problem List   Diagnosis Date Noted  . Chronic systolic CHF (congestive heart failure) ([place])   . Hyperglycemia   . Ventral hernia without obstruction or gangrene 06/06/2015  . Renal mass   . SBO (small bowel obstruction) ([place])   . Small bowel obstruction ([place]) 05/01/2014  . Pulmonary HTN ([place]) 01/01/2014  . Tachycardia 01/01/2014  . Acute renal failure ([place]) 01/01/2014  . Iron deficiency anemia 01/01/2014  . Atrial flutter ([place]) 12/31/2013  . CHF, acute ([place]) 12/31/2013  . Encounter for ostomy care education 11/30/2013  . Stoma dermatitis 11/30/2013  . Incisional hernia 11/30/2013  . Constipation, chronic 11/30/2013  . Charcot's joint of foot 07/07/2013  . Bilateral leg edema 04/20/2013  . 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 Rheumatoid arthritis ([place]) 06/07/2009  . THYROID NODULE 07/01/2007  . LUNG NODULE 07/01/2007  . Osteoporosis 05/07/2007  . 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 Blindness 04/12/2007  . HEMORRHOIDS, INTERNAL 04/12/2007  . 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 HYPERLIPIDEMIA 01/07/2007  . Essential hypertension 01/07/2007  . GERD 01/07/2007  .  Marfan's syndrome 01/07/2007   Social History   Social History  . Marital status: Single    Spouse name: N/A  . Number of children: N/A  . Years of education: N/A   Occupational History  . Not on file.   Social History Main Topics  . Smoking status: Former Smoker    Packs/day: 1.00    Types: Cigarettes    Quit date: 11/27/2012  . Smokeless tobacco: Never Used  . Alcohol use No     Comment: recovering alcoholic sober since 0000000  . Drug use: No  . Sexual activity: No   Other Topics Concern  . Not on file   Social History Narrative  . No narrative on file    Ms. Castleman's family history is not on file.      Objective:    Vitals:   03/12/16 1327  BP: 128/78  Pulse: 80    Physical Exam  chronically ill-appearing older African-American female in no acute distress, she is in a  wheelchair accompanied by her daughter blood pressure 128/78 pulse 80 BMI 19.4. HEENT; nontraumatic normocephalic EOMI PERRLA sclera anicteric, poor dentition Cardiovascular; regular rate and rhythm with [id number] soft systolic murmur, Pulmonary ;clear bilaterally, Abdomen; patient has a very large ventral hernia which is soft and reducible and a colostomy in the left mid quadrant, midline incisional scar no palpable mass or hepatosplenomegaly, bowel sounds present on Rectal; exam not done, Extremities; patient has severe deformity of her hands and fingers from rheumatoid arthritis, Neuropsych ;mood and affect appropriate       Assessment & Plan:   #1  60 year old African-American female status post sigmoid colectomy and diverting end colostomy June 2014 for perforated diverticulitis. Patient needs repeat colonoscopy prior to consideration of repair of a large ventral hernia and reversal of her colostomy. She has been evaluated by Dr. Johney Maine in [place] who referred her on to a surgeon at [facility]. #2 diverticulosis #3 history of hyperplastic colon polyps Number for prior history of small  bowel obstructions #5 rheumatoid arthritis-with severe debilitation # 6 Marfan's syndrome #7 history of congestive heart failure-last echo in 2015 EF of 20% #8 history of pulmonary hypertension #9 history of atrial flutter #10 remote history of EtOH abuse-in remission since 1997  Plan; patient needs repeat 2-D echo to reassess cardiomyopathy. This should be done prior to surgery or colonoscopy. We will communicate with her PCP Dr. Sarajane Jews regarding obtaining 2-D echo  Will schedule for colonoscopy with Dr. Carlean Purl at the hospital in October. Procedure discussed in detail with patient and her daughter including risks and benefits and she is agreeable to proceed.  We'll request records from Dr. Johney Maine and will need to determine who she was referred to at [facility] so can forward colonoscopy results.   Tangelia Sanson Genia Harold PA-C 03/12/2016   Cc: [name], MD

## 2016-03-12 NOTE — Patient Instructions (Signed)
We are getting in touch with Dr. Sarajane Jews to schedule the Echo test.   You have been scheduled for a colonoscopy. Please follow written instructions given to you at your visit today.  Please pick up your prep supplies at the pharmacy within the next 1-3 days. If you use inhalers (even only as needed), please bring them with you on the day of your procedure. Your physician has requested that you go to www.startemmi.com and enter the access code given to you at your visit today. This web site gives a general overview about your procedure. However, you should still follow specific instructions given to you by our office regarding your preparation for the procedure.

## 2016-03-13 ENCOUNTER — Other Ambulatory Visit: Payer: Self-pay | Admitting: *Deleted

## 2016-03-14 NOTE — Progress Notes (Signed)
Agree with Ms. Esterwood's assessment and plan. Arlyss Weathersby E. Kathlyn Leachman, MD, FACG   

## 2016-03-15 NOTE — Addendum Note (Signed)
Addended by: [name] A on: 03/15/2016 06:17 AM   Modules accepted: Orders

## 2016-03-21 DIAGNOSIS — M069 Rheumatoid arthritis, unspecified: Secondary | ICD-10-CM | POA: Diagnosis not present

## 2016-03-26 ENCOUNTER — Encounter: Payer: Medicare Other | Admitting: Internal Medicine

## 2016-03-30 ENCOUNTER — Telehealth: Payer: Self-pay

## 2016-03-30 NOTE — Telephone Encounter (Signed)
Left message for pt to call back.  Per Nicoletta Ba PA pt has to have echo done prior to her surgery and colonoscopy. Echo is not scheduled until 04/09/16. Colon is scheduled for 04/04/16. Discussed with pt that we will call her back to reschedule colon after echo is completed.

## 2016-03-30 NOTE — Telephone Encounter (Signed)
-----   Message from Patti E [place], [place] sent at 03/30/2016  2:25 PM EDT ----- Left voice mail saying she needs to cancel her hospital procedure for next week because she is having a echo done that day.  Could you please call her and see what's going on since I'm in clinic.  Thank you.

## 2016-04-02 ENCOUNTER — Ambulatory Visit (INDEPENDENT_AMBULATORY_CARE_PROVIDER_SITE_OTHER): Payer: Medicare Other | Admitting: Obstetrics and Gynecology

## 2016-04-02 ENCOUNTER — Encounter: Payer: Self-pay | Admitting: Obstetrics and Gynecology

## 2016-04-02 VITALS — BP 122/82 | HR 80 | Resp 16

## 2016-04-02 DIAGNOSIS — N950 Postmenopausal bleeding: Secondary | ICD-10-CM

## 2016-04-02 DIAGNOSIS — N719 Inflammatory disease of uterus, unspecified: Secondary | ICD-10-CM | POA: Diagnosis not present

## 2016-04-02 NOTE — Telephone Encounter (Signed)
Dr. Carlean Purl and Amy,  How urgently does the colonoscopy need to be done?  Next date available to schedule is January 2.  Dr. Carlean Purl are there any other days you want to consider?

## 2016-04-02 NOTE — Telephone Encounter (Signed)
On my list of things to do is to look for an additional 1-2 half hospital days to do procedures in Nov/Dec.  Am working on this this week.

## 2016-04-02 NOTE — Progress Notes (Signed)
GYNECOLOGY  VISIT   HPI: 60 y.o.   Single  African American  female   [id number] with Patient's last menstrual period was 06/25/2006 (lmp unknown).   here for follow up PMB. The patient had PMP bleeding, inflammatory exudate, treated with doxycycline. F/U biopsy was benign. She hasn't had any more bleeding. No pain. Without c/o.     GYNECOLOGIC HISTORY: Patient's last menstrual period was 06/25/2006 (lmp unknown). Contraception:postmenopause  Menopausal hormone therapy: none        OB History    Gravida Para Term Preterm AB Living   1 1 1     1    SAB TAB Ectopic Multiple Live Births           1         Patient Active Problem List   Diagnosis Date Noted  . Chronic systolic CHF (congestive heart failure) ([place])   . Hyperglycemia   . Ventral hernia without obstruction or gangrene 06/06/2015  . Renal mass   . SBO (small bowel obstruction) ([place])   . Small bowel obstruction 05/01/2014  . Pulmonary HTN ([place]) 01/01/2014  . Tachycardia 01/01/2014  . Acute renal failure ([place]) 01/01/2014  . Iron deficiency anemia 01/01/2014  . Atrial flutter ([place]) 12/31/2013  . CHF, acute ([place]) 12/31/2013  . Encounter for ostomy care education 11/30/2013  . Stoma dermatitis 11/30/2013  . Incisional hernia 11/30/2013  . Constipation, chronic 11/30/2013  . Charcot's joint of foot 07/07/2013  . Bilateral leg edema 04/20/2013  . 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 Rheumatoid arthritis ([place]) 06/07/2009  . THYROID NODULE 07/01/2007  . LUNG NODULE 07/01/2007  . Osteoporosis 05/07/2007  . 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 Blindness 04/12/2007  . HEMORRHOIDS, INTERNAL 04/12/2007  . 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 HYPERLIPIDEMIA 01/07/2007  . Essential hypertension 01/07/2007  . GERD 01/07/2007  . Marfan's syndrome 01/07/2007    Past Medical History:  Diagnosis Date  . Atrial flutter ([place])   . Chronic gout 2008  . Diverticulitis of large  intestine with perforation   . Fibroid   . Hearing aid worn    pt wears bilateral hearing aids  . Hernia 4/08  . Hypertension   . Legally blind    since pt was a teenager  . Marfan's syndrome affecting skin    with scolosis  . Osteoporosis   . Rheumatoid arthritis [facility])    sees Dr. Gavin Pound   . SBO (small bowel obstruction) 01/05/2013  . Small bowel obstruction due to adhesions   . Status post bunionectomy 1/10   bilateral   . Stroke ([place]) 2/08  . 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 ABLATION  01/04/14   atrial flutter ablation (2 circuits) by Dr Lovena Le  . ATRIAL FLUTTER ABLATION N/A 01/04/2014   Procedure: ATRIAL FLUTTER ABLATION;  Surgeon: Evans Lance, MD;  Location[facility] CATH LAB;  Service: Cardiovascular;  Laterality: N/A;  . BUNIONECTOMY Bilateral 1/10  . COLON SURGERY     colectomy  . COLOSTOMY  11/27/2012  . HERNIA REPAIR    . LAPAROSCOPIC ABDOMINAL EXPLORATION N/A 01/06/2013   Procedure: LAPAROSCOPIC ABDOMINAL EXPLORATION;  Surgeon: Adin Hector, MD;  Location: [facility] ORS;  Service: General;  Laterality: N/A;  . LAPAROSCOPIC LYSIS OF ADHESIONS N/A 01/06/2013   Procedure: LAPAROSCOPIC LYSIS OF ADHESIONS/ INTEROTOMY  REPAIR;  Surgeon: Adin Hector, MD;  Location: [facility] ORS;  Service: General;  Laterality: N/A;  . LAPAROTOMY N/A 11/27/2012   Procedure: EXPLORATORY LAPAROTOMY SIGMOID COLECTOMY, COLOSTOMY;  Surgeon: Madilyn Hook, DO;  Location: [facility] ORS;  Service: General;  Laterality: N/A;  . TOTAL KNEE ARTHROPLASTY Bilateral 6/08    Current Outpatient Prescriptions  Medication Sig Dispense Refill  . Abatacept (ORENCIA) 125 MG/ML SOSY Inject 1 application into the skin once a week.    Marland Kitchen albuterol (PROVENTIL HFA;VENTOLIN HFA) 108 (90 BASE) MCG/ACT inhaler Inhale 1 puff into the lungs every 4 (four) hours as needed for wheezing or shortness of breath. 1  Inhaler 11  . allopurinol (ZYLOPRIM) 100 MG tablet Take 1 tablet (100 mg total) by mouth daily. 30 tablet 3  . amLODipine (NORVASC) 10 MG tablet Take 1 tablet (10 mg total) by mouth daily. 30 tablet 6  . aspirin EC 81 MG tablet Take 1 tablet (81 mg total) by mouth daily.    . betamethasone valerate ointment (VALISONE) 0.1 % Apply a small amount topically 2 x a day for 1-2 weeks 30 g 0  . Calcium Carbonate-Vitamin D (CALCIUM + D PO) Take 2,000 mg by mouth daily.     . cetirizine (ZYRTEC) 10 MG tablet Take 1 tablet (10 mg total) by mouth daily. 90 tablet 1  . ciprofloxacin-dexamethasone (CIPRODEX) otic suspension Place 4 drops into both ears 2 (two) times daily as needed. 7.5 mL 1  . diazepam (VALIUM) 2 MG tablet Take 1 tablet (2 mg total) by mouth every 12 (twelve) hours as needed (dizziness). 60 tablet 0  . docusate sodium (COLACE) 100 MG capsule Take 100 mg by mouth 2 (two) times daily.    Marland Kitchen doxycycline (VIBRAMYCIN) 100 MG capsule Take 1 capsule (100 mg total) by mouth 2 (two) times daily. 20 capsule 0  . folic acid (FOLVITE) 1 MG tablet Take 1 mg by mouth daily.    Marland Kitchen HYDROcodone-acetaminophen (NORCO) 10-325 MG tablet Take 1 tablet by mouth every 6 (six) hours as needed for severe pain. 120 tablet 0  . ketoconazole (NIZORAL) 2 % cream Apply 1 application topically 2 (two) times daily. 30 g 5  . meclizine (ANTIVERT) 25 MG tablet Take 1 tablet (25 mg total) by mouth 3 (three) times daily as needed for dizziness. 30 tablet 0  . megestrol (MEGACE ES) 625 MG/5ML suspension Take 625 mg by mouth 2 (two) times daily.    . methotrexate 25 MG/ML injection Inject 40 mg/m2 into the skin once a week. Take on Mondays    . metroNIDAZOLE (FLAGYL) 500 MG tablet Take 1 tablet (500 mg total) by mouth 2 (two) times daily. 14 tablet 0  . Multiple Vitamin (MULTIVITAMIN WITH MINERALS) TABS tablet Take 1 tablet by mouth daily.    Marland Kitchen NEXIUM 40 MG capsule Take 1 capsule (40 mg total) by mouth 2 (two) times daily. 60  capsule 5  . ondansetron (ZOFRAN) 4 MG tablet Take 1 tablet (4 mg total) by mouth every 8 (eight) hours as needed for nausea or vomiting. 12 tablet 0  . polyethylene glycol (MIRALAX / GLYCOLAX) packet Take 17 g by mouth daily. 14 each 11  . promethazine (PHENERGAN) 25 MG tablet Take 1 tablet (25 mg total) by mouth every 6 (six) hours as needed for nausea or vomiting. 30 tablet 0  . risedronate (ACTONEL) 150 MG tablet Take 1 tablet (150 mg total) by mouth every 30 (thirty) days. with water on empty stomach, nothing by  mouth or lie down for next 30 minutes. 4 tablet 11  . tiZANidine (ZANAFLEX) 4 MG tablet Take 1 tablet (4 mg total) by mouth 2 (two) times daily. 90 tablet 1  . traMADol (ULTRAM) 50 MG tablet take 1-2 tablets by mouth every 6 hours if needed 120 tablet 5  . vitamin C (ASCORBIC ACID) 500 MG tablet Take 1,000 mg by mouth daily.      No current facility-administered medications for this visit.      ALLERGIES: Review of patient's allergies indicates no known allergies.  Family History  Problem Relation Age of Onset  . Heart attack Neg Hx     Social History   Social History  . Marital status: Single    Spouse name: N/A  . Number of children: N/A  . Years of education: N/A   Occupational History  . Not on file.   Social History Main Topics  . Smoking status: Former Smoker    Packs/day: 1.00    Types: Cigarettes    Quit date: 11/27/2012  . Smokeless tobacco: Never Used  . Alcohol use No     Comment: recovering alcoholic sober since 0000000  . Drug use: No  . Sexual activity: No   Other Topics Concern  . Not on file   Social History Narrative  . No narrative on file    Review of Systems  Constitutional: Negative.   HENT: Negative.   Eyes: Negative.   Respiratory: Negative.   Cardiovascular: Negative.   Skin: Negative.   Neurological: Negative.   Endo/Heme/Allergies: Negative.   Psychiatric/Behavioral: Negative.     PHYSICAL EXAMINATION:    BP 122/82 (BP  Location: Left Arm, Patient Position: Sitting, Cuff Size: Normal)   Pulse 80   Resp 16   LMP 06/25/2006 (LMP Unknown)     General appearance: alert, cooperative and appears stated age Abdomen: soft, non-tender; large ventral hernia, ostomy bag.   Pelvic: External genitalia:  no lesions              Urethra:  normal appearing urethra with no masses, tenderness or lesions              Cervix: no cervical motion tenderness              Bimanual Exam: no masses, exam limited, not tender.   Chaperone was present for exam.  ASSESSMENT F/U from endometritis and PMP bleeding, doing well    PLAN Call with any other c/o.    An After Visit Summary was printed and given to the patient.

## 2016-04-03 ENCOUNTER — Other Ambulatory Visit (HOSPITAL_COMMUNITY): Payer: Medicare Other

## 2016-04-04 ENCOUNTER — Ambulatory Visit (HOSPITAL_COMMUNITY): Admission: RE | Admit: 2016-04-04 | Payer: Medicare Other | Source: Ambulatory Visit | Admitting: Internal Medicine

## 2016-04-04 ENCOUNTER — Other Ambulatory Visit: Payer: Self-pay | Admitting: Family Medicine

## 2016-04-04 SURGERY — COLONOSCOPY WITH PROPOFOL
Anesthesia: Monitor Anesthesia Care

## 2016-04-09 ENCOUNTER — Ambulatory Visit (HOSPITAL_COMMUNITY): Payer: Medicare Other

## 2016-04-10 DIAGNOSIS — Z933 Colostomy status: Secondary | ICD-10-CM | POA: Diagnosis not present

## 2016-04-24 ENCOUNTER — Ambulatory Visit (HOSPITAL_COMMUNITY): Payer: Medicare Other | Attending: Cardiology

## 2016-04-24 ENCOUNTER — Other Ambulatory Visit: Payer: Self-pay

## 2016-04-24 ENCOUNTER — Encounter (INDEPENDENT_AMBULATORY_CARE_PROVIDER_SITE_OTHER): Payer: Self-pay

## 2016-04-24 DIAGNOSIS — I5030 Unspecified diastolic (congestive) heart failure: Secondary | ICD-10-CM | POA: Insufficient documentation

## 2016-04-24 DIAGNOSIS — I081 Rheumatic disorders of both mitral and tricuspid valves: Secondary | ICD-10-CM | POA: Insufficient documentation

## 2016-04-24 DIAGNOSIS — I5022 Chronic systolic (congestive) heart failure: Secondary | ICD-10-CM | POA: Diagnosis not present

## 2016-05-07 ENCOUNTER — Other Ambulatory Visit: Payer: Self-pay | Admitting: Family Medicine

## 2016-05-10 ENCOUNTER — Other Ambulatory Visit: Payer: Self-pay | Admitting: Family Medicine

## 2016-05-11 DIAGNOSIS — Z933 Colostomy status: Secondary | ICD-10-CM | POA: Diagnosis not present

## 2016-05-14 ENCOUNTER — Telehealth: Payer: Self-pay | Admitting: Family Medicine

## 2016-05-14 DIAGNOSIS — M1A09X0 Idiopathic chronic gout, multiple sites, without tophus (tophi): Secondary | ICD-10-CM | POA: Diagnosis not present

## 2016-05-14 DIAGNOSIS — Z79899 Other long term (current) drug therapy: Secondary | ICD-10-CM | POA: Diagnosis not present

## 2016-05-14 DIAGNOSIS — Z23 Encounter for immunization: Secondary | ICD-10-CM | POA: Diagnosis not present

## 2016-05-14 DIAGNOSIS — M2550 Pain in unspecified joint: Secondary | ICD-10-CM | POA: Diagnosis not present

## 2016-05-14 DIAGNOSIS — M0579 Rheumatoid arthritis with rheumatoid factor of multiple sites without organ or systems involvement: Secondary | ICD-10-CM | POA: Diagnosis not present

## 2016-05-14 MED ORDER — HYDROCODONE-ACETAMINOPHEN 10-325 MG PO TABS: 1.0000 | ORAL_TABLET | Freq: Four times a day (QID) | ORAL | 0 refills | Status: DC | PRN

## 2016-05-14 NOTE — Telephone Encounter (Signed)
Pt request refill  HYDROcodone-acetaminophen (NORCO) 10-325 MG tablet   ALSO, Pt would like to know if ok to take  Milk of magnesia with her health issues

## 2016-05-14 NOTE — Telephone Encounter (Signed)
I spoke with pt and went over all of this information.

## 2016-05-14 NOTE — Telephone Encounter (Signed)
done

## 2016-05-14 NOTE — Telephone Encounter (Signed)
Per Dr. Sarajane Jews, yes pt can try the Milk of Magnesium, also pt requesting to update Nexium to once per day, which I did. Script for Norco is ready for pick up at front office.

## 2016-05-15 DIAGNOSIS — M069 Rheumatoid arthritis, unspecified: Secondary | ICD-10-CM | POA: Diagnosis not present

## 2016-06-12 DIAGNOSIS — Z933 Colostomy status: Secondary | ICD-10-CM | POA: Diagnosis not present

## 2016-06-22 ENCOUNTER — Other Ambulatory Visit: Payer: Self-pay | Admitting: Family Medicine

## 2016-06-29 ENCOUNTER — Encounter (HOSPITAL_COMMUNITY): Payer: Self-pay

## 2016-06-29 ENCOUNTER — Emergency Department (HOSPITAL_COMMUNITY): Payer: Medicare Other

## 2016-06-29 ENCOUNTER — Emergency Department (HOSPITAL_COMMUNITY)
Admission: EM | Admit: 2016-06-29 | Discharge: 2016-06-29 | Disposition: A | Discharge status: Home / Self Care | Payer: Medicare Other | Attending: Emergency Medicine | Admitting: Emergency Medicine

## 2016-06-29 DIAGNOSIS — W010XXA Fall on same level from slipping, tripping and stumbling without subsequent striking against object, initial encounter: Secondary | ICD-10-CM | POA: Insufficient documentation

## 2016-06-29 DIAGNOSIS — Z87891 Personal history of nicotine dependence: Secondary | ICD-10-CM | POA: Diagnosis not present

## 2016-06-29 DIAGNOSIS — Y929 Unspecified place or not applicable: Secondary | ICD-10-CM | POA: Insufficient documentation

## 2016-06-29 DIAGNOSIS — Z96653 Presence of artificial knee joint, bilateral: Secondary | ICD-10-CM | POA: Insufficient documentation

## 2016-06-29 DIAGNOSIS — M069 Rheumatoid arthritis, unspecified: Secondary | ICD-10-CM | POA: Diagnosis not present

## 2016-06-29 DIAGNOSIS — S99921A Unspecified injury of right foot, initial encounter: Secondary | ICD-10-CM | POA: Diagnosis not present

## 2016-06-29 DIAGNOSIS — I110 Hypertensive heart disease with heart failure: Secondary | ICD-10-CM | POA: Insufficient documentation

## 2016-06-29 DIAGNOSIS — S92512A Displaced fracture of proximal phalanx of left lesser toe(s), initial encounter for closed fracture: Secondary | ICD-10-CM | POA: Diagnosis not present

## 2016-06-29 DIAGNOSIS — I5022 Chronic systolic (congestive) heart failure: Secondary | ICD-10-CM | POA: Diagnosis not present

## 2016-06-29 DIAGNOSIS — Y939 Activity, unspecified: Secondary | ICD-10-CM | POA: Insufficient documentation

## 2016-06-29 DIAGNOSIS — Z8673 Personal history of transient ischemic attack (TIA), and cerebral infarction without residual deficits: Secondary | ICD-10-CM | POA: Diagnosis not present

## 2016-06-29 DIAGNOSIS — S92912A Unspecified fracture of left toe(s), initial encounter for closed fracture: Secondary | ICD-10-CM

## 2016-06-29 DIAGNOSIS — Z7982 Long term (current) use of aspirin: Secondary | ICD-10-CM | POA: Insufficient documentation

## 2016-06-29 DIAGNOSIS — M79671 Pain in right foot: Secondary | ICD-10-CM | POA: Diagnosis not present

## 2016-06-29 DIAGNOSIS — Y999 Unspecified external cause status: Secondary | ICD-10-CM | POA: Diagnosis not present

## 2016-06-29 MED ORDER — OXYCODONE-ACETAMINOPHEN 5-325 MG PO TABS: 1.0000 | ORAL_TABLET | ORAL | 0 refills | Status: DC | PRN

## 2016-06-29 NOTE — Discharge Instructions (Signed)
You have fractured a small bone on the second toe of your left foot. Follow-up with your foot doctor. Continue to use your regular shoes. Prescription for pain medicine

## 2016-06-29 NOTE — ED Triage Notes (Signed)
Pt dropped coffee into her lap.  Attempted to jump up away from pain.  Fell and twisted both feet.  More so to left.  Burn is mild redness according to patient.

## 2016-07-01 NOTE — ED Provider Notes (Signed)
[facility] DEPT Provider Note   CSN: [id number] Arrival date & time: 06/29/16  1514     History   Chief Complaint Chief Complaint  Patient presents with  . Ankle Pain  . Burn    HPI [name] is a 61 y.o. female.  Level V caveat for legally blind and hard of hearing. Patient accidentally spilled some coffee onto her left thigh. Additionally, she apparently tripped and fell hurting both of her feet. No head or neck trauma. Her behavior is normal according to the caregiver.      Past Medical History:  Diagnosis Date  . Atrial flutter ([place])   . Chronic gout 2008  . Diverticulitis of large intestine with perforation   . Fibroid   . Hearing aid worn    pt wears bilateral hearing aids  . Hernia 4/08  . Hypertension   . Legally blind    since pt was a teenager  . Marfan's syndrome affecting skin    with scolosis  . Osteoporosis   . Rheumatoid arthritis [facility])    sees Dr. Gavin Pound   . SBO (small bowel obstruction) 01/05/2013  . Small bowel obstruction due to adhesions   . Status post bunionectomy 1/10   bilateral   . Stroke ([place]) 2/08  . 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 Chronic systolic CHF (congestive heart failure) ([place])   . Hyperglycemia   . Ventral hernia without obstruction or gangrene 06/06/2015  . Renal mass   . SBO (small bowel obstruction) ([place])   . Small bowel obstruction 05/01/2014  . Pulmonary HTN ([place]) 01/01/2014  . Tachycardia 01/01/2014  . Acute renal failure ([place]) 01/01/2014  . Iron deficiency anemia 01/01/2014  . Atrial flutter ([place]) 12/31/2013  . CHF, acute ([place]) 12/31/2013  . Encounter for ostomy care education 11/30/2013  . Stoma dermatitis 11/30/2013  . Incisional hernia 11/30/2013  . Constipation, chronic 11/30/2013  . Charcot's joint of foot 07/07/2013  . Bilateral leg edema 04/20/2013  .  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 Rheumatoid arthritis ([place]) 06/07/2009  . THYROID NODULE 07/01/2007  . LUNG NODULE 07/01/2007  . Osteoporosis 05/07/2007  . 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 Blindness 04/12/2007  . HEMORRHOIDS, INTERNAL 04/12/2007  . 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 HYPERLIPIDEMIA 01/07/2007  . Essential hypertension 01/07/2007  . GERD 01/07/2007  . Marfan's syndrome 01/07/2007    Past Surgical History:  Procedure Laterality Date  . ABLATION  01/04/14   atrial flutter ablation (2 circuits) by Dr Lovena Le  . ATRIAL FLUTTER ABLATION N/A 01/04/2014   Procedure: ATRIAL FLUTTER ABLATION;  Surgeon: Evans Lance, MD;  Location: [facility] CATH LAB;  Service: Cardiovascular;  Laterality: N/A;  . BUNIONECTOMY Bilateral 1/10  . COLON SURGERY     colectomy  . COLOSTOMY  11/27/2012  . HERNIA REPAIR    . LAPAROSCOPIC ABDOMINAL EXPLORATION N/A 01/06/2013   Procedure: LAPAROSCOPIC ABDOMINAL EXPLORATION;  Surgeon: Adin Hector, MD;  Location: [facility] ORS;  Service: General;  Laterality: N/A;  . LAPAROSCOPIC LYSIS OF ADHESIONS N/A 01/06/2013   Procedure: LAPAROSCOPIC LYSIS OF ADHESIONS/ INTEROTOMY REPAIR;  Surgeon: Adin Hector, MD;  Location: [facility] ORS;  Service: General;  Laterality: N/A;  . LAPAROTOMY N/A 11/27/2012   Procedure: EXPLORATORY LAPAROTOMY SIGMOID COLECTOMY, COLOSTOMY;  Surgeon: Madilyn Hook, DO;  Location: [facility] ORS;  Service: General;  Laterality: N/A;  . TOTAL KNEE ARTHROPLASTY Bilateral 6/08    OB History    Gravida Para Term Preterm AB Living   1 1 1     1    SAB TAB Ectopic Multiple Live Births           1       Home Medications    Prior to Admission medications   Medication Sig Start Date End Date Taking? Authorizing Provider  Abatacept (ORENCIA) 125 MG/ML SOSY Inject 1 application into the skin once a week.   Yes Historical Provider, MD  albuterol (PROVENTIL HFA;VENTOLIN HFA) 108 (90 BASE) MCG/ACT inhaler  Inhale 1 puff into the lungs every 4 (four) hours as needed for wheezing or shortness of breath. 12/29/13  Yes [name], MD  allopurinol (ZYLOPRIM) 100 MG tablet take 1 tablet by mouth once daily Patient taking differently: take 100mg  tablet by mouth in the afternoon 05/14/16  Yes [name], MD  amLODipine (NORVASC) 10 MG tablet take 1 tablet by mouth once daily Patient taking differently: take 10mg  tablet by mouth once daily 05/14/16  Yes [name], MD  aspirin EC 81 MG tablet Take 1 tablet (81 mg total) by mouth daily. 05/11/14  Yes Verlee Monte, MD  betamethasone valerate ointment (VALISONE) 0.1 % Apply a small amount topically 2 x a day for 1-2 weeks Patient taking differently: Apply 1 application topically 2 (two) times daily as needed (inflammation). Apply a small amount topically 2 x a day for 1-2 weeks 02/20/16  Yes Salvadore Dom, MD  Calcium Carbonate-Vitamin D (CALCIUM + D PO) Take 2,000 mg by mouth daily.    Yes Historical Provider, MD  cetirizine (ZYRTEC) 10 MG tablet Take 1 tablet (10 mg total) by mouth daily. 11/17/14  Yes [name], MD  diazepam (VALIUM) 2 MG tablet Take 1 tablet (2 mg total) by mouth every 12 (twelve) hours as needed (dizziness). 12/26/15  Yes [name], MD  docusate sodium (COLACE) 100 MG capsule Take 100 mg by mouth 2 (two) times daily.   Yes Historical Provider, MD  folic acid (FOLVITE) 1 MG tablet Take 1 mg by mouth daily.   Yes Historical Provider, MD  HYDROcodone-acetaminophen (NORCO) 10-325 MG tablet Take 1 tablet by mouth every 6 (six) hours as needed for severe pain. 05/14/16  Yes [name], MD  ketoconazole (NIZORAL) 2 % cream Apply 1 application topically 2 (two) times daily. Patient taking differently: Apply 1 application topically 2 (two) times daily as needed for irritation.  08/10/13  Yes [name], MD  meclizine (ANTIVERT) 25 MG tablet Take 1 tablet (25 mg total) by mouth 3 (three) times daily as needed for dizziness.  12/17/15  Yes Waynetta Pean, PA-C  megestrol (MEGACE ES) 625 MG/5ML suspension Take 625 mg by mouth 2 (two) times daily.   Yes Historical Provider, MD  Methotrexate Sodium (METHOTREXATE, PF,) 50 MG/2ML injection Inject 0.5 mLs into the muscle every Monday. 05/14/16  Yes Historical Provider, MD  Multiple Vitamin (MULTIVITAMIN WITH MINERALS) TABS tablet Take 1 tablet by mouth daily.   Yes Historical Provider, MD  NEXIUM 40 MG capsule Take 1 capsule (40 mg total) by mouth 2 (two) times daily. Patient taking differently: Take 40 mg by mouth daily.  09/05/15  Yes [name], MD  ondansetron (ZOFRAN) 4 MG tablet Take 1 tablet (4 mg total) by mouth every 8 (eight) hours as needed for  nausea or vomiting. 06/20/15  Yes Domenic Moras, PA-C  polyethylene glycol (MIRALAX / GLYCOLAX) packet Take 17 g by mouth daily. Patient taking differently: Take 17 g by mouth daily as needed for moderate constipation.  11/11/15  Yes [name], MD  risedronate (ACTONEL) 150 MG tablet Take 1 tablet (150 mg total) by mouth every 30 (thirty) days. with water on empty stomach, nothing by mouth or lie down for next 30 minutes. 08/05/15  Yes [name], MD  tiZANidine (ZANAFLEX) 4 MG tablet take 1 tablet by mouth twice a day Patient taking differently: take 4mg  tablet by mouth twice a day 06/22/16  Yes [name], MD  traMADol Veatrice Bourbon) 50 MG tablet take 1-2 tablets by mouth every 6 hours if needed Patient taking differently: take 50-100mg  tablets by mouth every 6 hours if needed for pain 02/21/16  Yes [name], MD  vitamin C (ASCORBIC ACID) 500 MG tablet Take 1,000 mg by mouth daily.    Yes Historical Provider, MD  doxycycline (VIBRAMYCIN) 100 MG capsule Take 1 capsule (100 mg total) by mouth 2 (two) times daily. Patient not taking: Reported on 06/29/2016 02/24/16   Megan Salon, MD  metroNIDAZOLE (FLAGYL) 500 MG tablet Take 1 tablet (500 mg total) by mouth 2 (two) times daily. Patient not taking: Reported on 06/29/2016  02/21/16   Salvadore Dom, MD  oxyCODONE-acetaminophen (PERCOCET) 5-325 MG tablet Take 1-2 tablets by mouth every 4 (four) hours as needed. 06/29/16   Nat Christen, MD    Family History Family History  Problem Relation Age of Onset  . Heart attack Neg Hx     Social History Social History  Substance Use Topics  . Smoking status: Former Smoker    Packs/day: 1.00    Types: Cigarettes    Quit date: 11/27/2012  . Smokeless tobacco: Never Used  . Alcohol use No     Comment: recovering alcoholic sober since 0000000     Allergies   Patient has no known allergies.   Review of Systems Review of Systems  All other systems reviewed and are negative.    Physical Exam Updated Vital Signs BP 133/78   Pulse 82   Temp 97.9 F (36.6 C) (Oral)   Resp 18   LMP 06/25/2006 (LMP Unknown)   SpO2 98%   Physical Exam  Constitutional: She is oriented to person, place, and time.  Frail, pleasant  HENT:  Head: Normocephalic and atraumatic.  Eyes: Conjunctivae are normal.  Blind  Neck: Neck supple.  Cardiovascular: Normal rate and regular rhythm.   Pulmonary/Chest: Effort normal and breath sounds normal.  Abdominal: Soft. Bowel sounds are normal.  Musculoskeletal:  Patient is tender over the dorsum of both feet.  Neurological: She is alert and oriented to person, place, and time.  Skin: Skin is warm and dry.  Epidermis of the left thigh shows no evidence of burn.  Psychiatric: She has a normal mood and affect. Her behavior is normal.  Nursing note and vitals reviewed.    ED Treatments / Results  Labs (all labs ordered are listed, but only abnormal results are displayed) Labs Reviewed - No data to display  EKG  EKG Interpretation None       Radiology Dg Foot Complete Left  Result Date: 06/29/2016 CLINICAL DATA:  Foot pain after twisting injury. Rheumatoid arthritis. The EXAM: LEFT FOOT - COMPLETE 3+ VIEW COMPARISON:  MRI 05/05/2013 FINDINGS: Diffuse osteopenia of the left  foot and ankle. Partial ankylosis  of the midfoot articulations more so laterally. Ankylosed appearance of the PIP joints of the second, third and fourth digits. Screw fixation across the interphalangeal joint of the left great toe. Hallux valgus of the great toe with erosive changes. Acute closed intra-articular fracture at the base of the second proximal phalanx extending into the MTP joint. Joint space narrowing is seen of the DIP joints, first and fourth MTP joints. Chronic collapsed appearance of the midfoot. IMPRESSION: Changes of rheumatoid arthritis. Acute closed intra-articular fracture at the base of the second proximal phalanx extending into the second MTP joint. Electronically Signed   By: Ashley Royalty M.D.   On: 06/29/2016 19:15   Dg Foot Complete Right  Result Date: 06/29/2016 CLINICAL DATA:  History of rheumatoid arthritis. Patient dropped coffee on lap, fell, twisting both feet. Complaining of dorsal MTP joint pain of both feet worse on the left. Complete EXAM: RIGHT FOOT COMPLETE - 3+ VIEW COMPARISON:  None. FINDINGS: Diffuse osteopenia of the visualized right foot and ankle. Hindfoot valgus with collapse of the midfoot are again noted with ankylosis of the interphalangeal joint of the great toe,, second, fourth and fifth PIP joints and partial ankylosis of the bones of the midfoot. Erosive change of the distal third proximal phalanx, fourth middle phalanx, and head of fifth and first MTP joints. No acute fracture is apparent. IMPRESSION: Hindfoot valgus, midfoot collapse, osteopenia, ankylosis and partial ankylosis as above described of the toes and midfoot, gracile erosive appearance of the third proximal phalanx, fourth middle phalanx and metatarsal heads would be in keeping with advanced rheumatoid arthritis. The tapered appearance of the third proximal phalanx although suspect for psoriatic arthritis can be seen in rheumatoid as well. No fracture is identified. Electronically Signed   By:  Ashley Royalty M.D.   On: 06/29/2016 19:10    Procedures Procedures (including critical care time)  Medications Ordered in ED Medications - No data to display   Initial Impression / Assessment and Plan / ED Course  I have reviewed the triage vital signs and the nursing notes.  Pertinent labs & imaging results that were available during my care of the patient were reviewed by me and considered in my medical decision making (see chart for details).  Clinical Course     Plain films of the left foot show a fracture through base of the second proximal phalanx. No acute intervention necessary. I did not identify any burns on this patient. She will wear a firm shoe and follow-up with her foot doctor  Final Clinical Impressions(s) / ED Diagnoses   Final diagnoses:  Closed fracture of proximal phalanx of toe of left foot    New Prescriptions Discharge Medication List as of 06/29/2016  9:30 PM    START taking these medications   Details  oxyCODONE-acetaminophen (PERCOCET) 5-325 MG tablet Take 1-2 tablets by mouth every 4 (four) hours as needed., Starting Fri 06/29/2016, Print         Nat Christen, MD 07/01/16 1550

## 2016-07-04 DIAGNOSIS — Z933 Colostomy status: Secondary | ICD-10-CM | POA: Diagnosis not present

## 2016-07-09 ENCOUNTER — Encounter: Payer: Self-pay | Admitting: Podiatry

## 2016-07-09 ENCOUNTER — Ambulatory Visit (INDEPENDENT_AMBULATORY_CARE_PROVIDER_SITE_OTHER): Payer: Medicare Other | Admitting: Podiatry

## 2016-07-09 DIAGNOSIS — B351 Tinea unguium: Secondary | ICD-10-CM | POA: Diagnosis not present

## 2016-07-09 DIAGNOSIS — S99922A Unspecified injury of left foot, initial encounter: Secondary | ICD-10-CM | POA: Diagnosis not present

## 2016-07-09 DIAGNOSIS — R600 Localized edema: Secondary | ICD-10-CM

## 2016-07-09 NOTE — Progress Notes (Signed)
Subjective: 61 year old blind female presents with her daughter on a wheel chair complaining of broken bone on top of left foot. She was told to follow up with her foot doctor. She injured her left foot by falling down when hot coffee spill on her foot. Her foot bent down back ward. At this time patient denies any pain.  Pain is only when stand on her foot. She is not diabetic.  Objective: Pedal dorsalis pedis arteries are not palpable on both feet. Both feet are cool touch.   Deforming rheumatoid with contracted digits bilateral. Both feet are flat with collapsed arch, sever lateral abduction of the midfoot on rearfoot.  No pain on injured left foot with mild range of motion.  Hypertrophic mycotic nails both great toe nails.   Assessment: No open wound with ecchymosis under the ball of left foot. Deforming Rheumatoid.  Onychomycosis bilateral hallux.  Left foot injury with mild soft tissue contusion.   Plan: All nails debrided. Instructed to keep the lower limb elevated and rest.

## 2016-07-09 NOTE — Patient Instructions (Signed)
Injured left foot. Noted of ecchymosis under the ball of left foot. No change in temperature or open skin lesion. Gentle range of motion does not elicit pain.  Assessment: Strained forefoot from injury left foot without significant dislocation or subluxation.  Continue to prop up lower limb while resting. Return as needed.

## 2016-07-10 ENCOUNTER — Other Ambulatory Visit: Payer: Self-pay | Admitting: Family Medicine

## 2016-07-13 ENCOUNTER — Telehealth: Payer: Self-pay | Admitting: Family Medicine

## 2016-07-13 ENCOUNTER — Ambulatory Visit: Payer: Self-pay | Admitting: Adult Health

## 2016-07-13 NOTE — Telephone Encounter (Signed)
Noted  

## 2016-07-13 NOTE — Telephone Encounter (Signed)
Patient Name: [name] DOB: 12-16-1955 Initial Comment caller states she has vomiting and severe abd pain Nurse Assessment Nurse: Ronnald Ramp, RN, Miranda Date/Time (Eastern Time): 07/13/2016 11:17:50 AM Confirm and document reason for call. If symptomatic, describe symptoms. ---Caller states she had diarrhea yesterday but no stool today. She has colostomy. She is having abdominal pain and vomiting since last night (last episode of emesis was 12 hrs ago). Does the patient have any new or worsening symptoms? ---Yes Will a triage be completed? ---Yes Related visit to physician within the last 2 weeks? ---No Does the PT have any chronic conditions? (i.e. diabetes, asthma, etc.) ---Yes List chronic conditions. ---HTN, Gout, Marfan's syndrome, GERD Is this a behavioral health or substance abuse call? ---No Guidelines Guideline Title Affirmed Question Affirmed Notes Abdominal Pain - Female [1] MILD-MODERATE pain AND [2] constant AND [3] present > 2 hours Final Disposition User See Physician within 4 Hours (or PCP triage) Ronnald Ramp, RN, Miranda Comments Scheduled pt for a 30 min (2 slots) with Dorothyann Peng at 330. Referrals REFERRED TO PCP OFFICE Disagree/Comply: Comply

## 2016-08-01 DIAGNOSIS — M069 Rheumatoid arthritis, unspecified: Secondary | ICD-10-CM | POA: Diagnosis not present

## 2016-08-07 ENCOUNTER — Emergency Department (HOSPITAL_COMMUNITY): Payer: Medicare Other

## 2016-08-07 ENCOUNTER — Observation Stay (HOSPITAL_COMMUNITY)
Admission: EM | Admit: 2016-08-07 | Discharge: 2016-08-08 | Disposition: A | Discharge status: Home / Self Care | Payer: Medicare Other | Attending: Family Medicine | Admitting: Family Medicine

## 2016-08-07 ENCOUNTER — Encounter (HOSPITAL_COMMUNITY): Payer: Self-pay | Admitting: Emergency Medicine

## 2016-08-07 DIAGNOSIS — M810 Age-related osteoporosis without current pathological fracture: Secondary | ICD-10-CM | POA: Insufficient documentation

## 2016-08-07 DIAGNOSIS — E43 Unspecified severe protein-calorie malnutrition: Secondary | ICD-10-CM | POA: Insufficient documentation

## 2016-08-07 DIAGNOSIS — E041 Nontoxic single thyroid nodule: Secondary | ICD-10-CM | POA: Insufficient documentation

## 2016-08-07 DIAGNOSIS — K432 Incisional hernia without obstruction or gangrene: Secondary | ICD-10-CM | POA: Diagnosis not present

## 2016-08-07 DIAGNOSIS — K7689 Other specified diseases of liver: Secondary | ICD-10-CM | POA: Insufficient documentation

## 2016-08-07 DIAGNOSIS — I700 Atherosclerosis of aorta: Secondary | ICD-10-CM | POA: Insufficient documentation

## 2016-08-07 DIAGNOSIS — Z8673 Personal history of transient ischemic attack (TIA), and cerebral infarction without residual deficits: Secondary | ICD-10-CM | POA: Insufficient documentation

## 2016-08-07 DIAGNOSIS — M1460 Charcot's joint, unspecified site: Secondary | ICD-10-CM | POA: Insufficient documentation

## 2016-08-07 DIAGNOSIS — I5022 Chronic systolic (congestive) heart failure: Secondary | ICD-10-CM | POA: Diagnosis not present

## 2016-08-07 DIAGNOSIS — R10813 Right lower quadrant abdominal tenderness: Secondary | ICD-10-CM | POA: Diagnosis not present

## 2016-08-07 DIAGNOSIS — H547 Unspecified visual loss: Secondary | ICD-10-CM | POA: Diagnosis not present

## 2016-08-07 DIAGNOSIS — K5732 Diverticulitis of large intestine without perforation or abscess without bleeding: Secondary | ICD-10-CM | POA: Diagnosis not present

## 2016-08-07 DIAGNOSIS — H548 Legal blindness, as defined in USA: Secondary | ICD-10-CM

## 2016-08-07 DIAGNOSIS — K439 Ventral hernia without obstruction or gangrene: Secondary | ICD-10-CM | POA: Insufficient documentation

## 2016-08-07 DIAGNOSIS — H9193 Unspecified hearing loss, bilateral: Secondary | ICD-10-CM | POA: Diagnosis not present

## 2016-08-07 DIAGNOSIS — K529 Noninfective gastroenteritis and colitis, unspecified: Secondary | ICD-10-CM | POA: Diagnosis not present

## 2016-08-07 DIAGNOSIS — Z87891 Personal history of nicotine dependence: Secondary | ICD-10-CM | POA: Insufficient documentation

## 2016-08-07 DIAGNOSIS — I10 Essential (primary) hypertension: Secondary | ICD-10-CM | POA: Diagnosis not present

## 2016-08-07 DIAGNOSIS — M419 Scoliosis, unspecified: Secondary | ICD-10-CM | POA: Insufficient documentation

## 2016-08-07 DIAGNOSIS — N2889 Other specified disorders of kidney and ureter: Secondary | ICD-10-CM | POA: Diagnosis not present

## 2016-08-07 DIAGNOSIS — Z933 Colostomy status: Secondary | ICD-10-CM | POA: Diagnosis not present

## 2016-08-07 DIAGNOSIS — I110 Hypertensive heart disease with heart failure: Secondary | ICD-10-CM | POA: Insufficient documentation

## 2016-08-07 DIAGNOSIS — R739 Hyperglycemia, unspecified: Secondary | ICD-10-CM | POA: Diagnosis not present

## 2016-08-07 DIAGNOSIS — M069 Rheumatoid arthritis, unspecified: Secondary | ICD-10-CM | POA: Diagnosis not present

## 2016-08-07 DIAGNOSIS — Z79899 Other long term (current) drug therapy: Secondary | ICD-10-CM | POA: Diagnosis not present

## 2016-08-07 DIAGNOSIS — K648 Other hemorrhoids: Secondary | ICD-10-CM | POA: Insufficient documentation

## 2016-08-07 DIAGNOSIS — I272 Pulmonary hypertension, unspecified: Secondary | ICD-10-CM | POA: Diagnosis not present

## 2016-08-07 DIAGNOSIS — N200 Calculus of kidney: Secondary | ICD-10-CM | POA: Insufficient documentation

## 2016-08-07 DIAGNOSIS — M109 Gout, unspecified: Secondary | ICD-10-CM | POA: Diagnosis not present

## 2016-08-07 DIAGNOSIS — I4892 Unspecified atrial flutter: Secondary | ICD-10-CM | POA: Insufficient documentation

## 2016-08-07 DIAGNOSIS — E785 Hyperlipidemia, unspecified: Secondary | ICD-10-CM | POA: Insufficient documentation

## 2016-08-07 DIAGNOSIS — I5032 Chronic diastolic (congestive) heart failure: Secondary | ICD-10-CM | POA: Diagnosis present

## 2016-08-07 DIAGNOSIS — Z96653 Presence of artificial knee joint, bilateral: Secondary | ICD-10-CM | POA: Insufficient documentation

## 2016-08-07 DIAGNOSIS — R197 Diarrhea, unspecified: Secondary | ICD-10-CM | POA: Diagnosis not present

## 2016-08-07 DIAGNOSIS — D509 Iron deficiency anemia, unspecified: Secondary | ICD-10-CM | POA: Diagnosis present

## 2016-08-07 DIAGNOSIS — R109 Unspecified abdominal pain: Secondary | ICD-10-CM | POA: Diagnosis not present

## 2016-08-07 DIAGNOSIS — Q8740 Marfan's syndrome, unspecified: Secondary | ICD-10-CM | POA: Diagnosis not present

## 2016-08-07 DIAGNOSIS — K219 Gastro-esophageal reflux disease without esophagitis: Secondary | ICD-10-CM | POA: Insufficient documentation

## 2016-08-07 DIAGNOSIS — K2970 Gastritis, unspecified, without bleeding: Secondary | ICD-10-CM | POA: Diagnosis not present

## 2016-08-07 DIAGNOSIS — Z7982 Long term (current) use of aspirin: Secondary | ICD-10-CM | POA: Insufficient documentation

## 2016-08-07 DIAGNOSIS — I5042 Chronic combined systolic (congestive) and diastolic (congestive) heart failure: Secondary | ICD-10-CM | POA: Diagnosis not present

## 2016-08-07 DIAGNOSIS — R1031 Right lower quadrant pain: Secondary | ICD-10-CM | POA: Diagnosis present

## 2016-08-07 LAB — CBC WITH DIFFERENTIAL/PLATELET
Basophils Absolute: 0 10*3/uL (ref 0.0–0.1)
Basophils Relative: 0 %
Eosinophils Absolute: 0 10*3/uL (ref 0.0–0.7)
Eosinophils Relative: 0 %
HCT: 35.8 % — ABNORMAL LOW (ref 36.0–46.0)
Hemoglobin: 11.9 g/dL — ABNORMAL LOW (ref 12.0–15.0)
Lymphocytes Relative: 5 %
Lymphs Abs: 0.5 10*3/uL — ABNORMAL LOW (ref 0.7–4.0)
MCH: 30.9 pg (ref 26.0–34.0)
MCHC: 33.2 g/dL (ref 30.0–36.0)
MCV: 93 fL (ref 78.0–100.0)
Monocytes Absolute: 0.6 10*3/uL (ref 0.1–1.0)
Monocytes Relative: 5 %
Neutro Abs: 10 10*3/uL — ABNORMAL HIGH (ref 1.7–7.7)
Neutrophils Relative %: 90 %
Platelets: 236 10*3/uL (ref 150–400)
RBC: 3.85 MIL/uL — ABNORMAL LOW (ref 3.87–5.11)
RDW: 14.8 % (ref 11.5–15.5)
WBC: 11.1 10*3/uL — ABNORMAL HIGH (ref 4.0–10.5)

## 2016-08-07 LAB — COMPREHENSIVE METABOLIC PANEL
ALT: 14 U/L (ref 14–54)
AST: 21 U/L (ref 15–41)
Albumin: 4.3 g/dL (ref 3.5–5.0)
Alkaline Phosphatase: 86 U/L (ref 38–126)
Anion gap: 9 (ref 5–15)
BUN: 8 mg/dL (ref 6–20)
CO2: 26 mmol/L (ref 22–32)
Calcium: 9.3 mg/dL (ref 8.9–10.3)
Chloride: 102 mmol/L (ref 101–111)
Creatinine, Ser: 0.8 mg/dL (ref 0.44–1.00)
GFR calc Af Amer: >60 mL/min (ref >60)
GFR calc non Af Amer: >60 mL/min (ref >60)
Glucose, Bld: 111 mg/dL — ABNORMAL HIGH (ref 65–99)
Potassium: 3.6 mmol/L (ref 3.5–5.1)
Sodium: 137 mmol/L (ref 135–145)
Total Bilirubin: 1.4 mg/dL — ABNORMAL HIGH (ref 0.3–1.2)
Total Protein: 8 g/dL (ref 6.5–8.1)

## 2016-08-07 LAB — URINALYSIS, ROUTINE W REFLEX MICROSCOPIC
Bilirubin Urine: NEGATIVE
Glucose, UA: NEGATIVE mg/dL
Hgb urine dipstick: NEGATIVE
Ketones, ur: NEGATIVE mg/dL
Nitrite: NEGATIVE
Protein, ur: NEGATIVE mg/dL
Specific Gravity, Urine: 1.003 — ABNORMAL LOW (ref 1.005–1.030)
pH: 7 (ref 5.0–8.0)

## 2016-08-07 LAB — BILIRUBIN, FRACTIONATED(TOT/DIR/INDIR)
Bilirubin, Direct: 0.1 mg/dL (ref 0.1–0.5)
Indirect Bilirubin: 0.9 mg/dL (ref 0.3–0.9)
Total Bilirubin: 1 mg/dL (ref 0.3–1.2)

## 2016-08-07 LAB — LIPASE, BLOOD: Lipase: 22 U/L (ref 11–51)

## 2016-08-07 MED ORDER — PANTOPRAZOLE SODIUM 40 MG PO TBEC
80.0000 mg | DELAYED_RELEASE_TABLET | Freq: Every day | ORAL | Status: DC
  Administered 2016-08-08: 80 mg via ORAL
  Filled 2016-08-07 (×2): qty 2

## 2016-08-07 MED ORDER — CIPROFLOXACIN IN D5W 400 MG/200ML IV SOLN
400.0000 mg | Freq: Once | INTRAVENOUS | Status: AC
  Administered 2016-08-07: 400 mg via INTRAVENOUS
  Filled 2016-08-07: qty 200

## 2016-08-07 MED ORDER — ONDANSETRON HCL 4 MG PO TABS: 4.0000 mg | ORAL_TABLET | Freq: Four times a day (QID) | ORAL | Status: DC | PRN

## 2016-08-07 MED ORDER — ALLOPURINOL 100 MG PO TABS
100.0000 mg | ORAL_TABLET | Freq: Every day | ORAL | Status: DC
  Administered 2016-08-07 – 2016-08-08 (×2): 100 mg via ORAL
  Filled 2016-08-07 (×2): qty 1

## 2016-08-07 MED ORDER — IOPAMIDOL (ISOVUE-300) INJECTION 61%
100.0000 mL | Freq: Once | INTRAVENOUS | Status: AC | PRN
  Administered 2016-08-07: 100 mL via INTRAVENOUS

## 2016-08-07 MED ORDER — HYDROCODONE-ACETAMINOPHEN 10-325 MG PO TABS
1.0000 | ORAL_TABLET | Freq: Four times a day (QID) | ORAL | Status: DC | PRN
  Administered 2016-08-07 – 2016-08-08 (×3): 1 via ORAL
  Filled 2016-08-07 (×3): qty 1

## 2016-08-07 MED ORDER — FENTANYL CITRATE (PF) 100 MCG/2ML IJ SOLN
50.0000 ug | Freq: Once | INTRAMUSCULAR | Status: AC
  Administered 2016-08-07: 50 ug via INTRAVENOUS
  Filled 2016-08-07: qty 2

## 2016-08-07 MED ORDER — ENOXAPARIN SODIUM 40 MG/0.4ML ~~LOC~~ SOLN
40.0000 mg | SUBCUTANEOUS | Status: DC
  Administered 2016-08-07: 40 mg via SUBCUTANEOUS
  Filled 2016-08-07: qty 0.4

## 2016-08-07 MED ORDER — TRAMADOL HCL 50 MG PO TABS: 50.0000 mg | ORAL_TABLET | Freq: Four times a day (QID) | ORAL | Status: DC | PRN

## 2016-08-07 MED ORDER — ASPIRIN EC 81 MG PO TBEC
81.0000 mg | DELAYED_RELEASE_TABLET | Freq: Every day | ORAL | Status: DC
  Administered 2016-08-07 – 2016-08-08 (×2): 81 mg via ORAL
  Filled 2016-08-07 (×2): qty 1

## 2016-08-07 MED ORDER — AMLODIPINE BESYLATE 10 MG PO TABS
10.0000 mg | ORAL_TABLET | Freq: Every day | ORAL | Status: DC
  Administered 2016-08-07 – 2016-08-08 (×2): 10 mg via ORAL
  Filled 2016-08-07 (×2): qty 1

## 2016-08-07 MED ORDER — FOLIC ACID 1 MG PO TABS
1.0000 mg | ORAL_TABLET | Freq: Every day | ORAL | Status: DC
  Administered 2016-08-07 – 2016-08-08 (×2): 1 mg via ORAL
  Filled 2016-08-07 (×2): qty 1

## 2016-08-07 MED ORDER — METRONIDAZOLE 500 MG PO TABS
500.0000 mg | ORAL_TABLET | Freq: Three times a day (TID) | ORAL | Status: DC
  Administered 2016-08-07 – 2016-08-08 (×3): 500 mg via ORAL
  Filled 2016-08-07 (×4): qty 1

## 2016-08-07 MED ORDER — CIPROFLOXACIN IN D5W 400 MG/200ML IV SOLN
400.0000 mg | Freq: Two times a day (BID) | INTRAVENOUS | Status: DC
  Administered 2016-08-08: 400 mg via INTRAVENOUS
  Filled 2016-08-07: qty 200

## 2016-08-07 MED ORDER — SODIUM CHLORIDE 0.9 % IV SOLN
INTRAVENOUS | Status: DC
  Administered 2016-08-07: 23:00:00 via INTRAVENOUS

## 2016-08-07 MED ORDER — TIZANIDINE HCL 4 MG PO TABS
4.0000 mg | ORAL_TABLET | Freq: Two times a day (BID) | ORAL | Status: DC
  Administered 2016-08-07: 4 mg via ORAL
  Filled 2016-08-07: qty 1

## 2016-08-07 MED ORDER — SODIUM CHLORIDE 0.9 % IV BOLUS (SEPSIS)
500.0000 mL | Freq: Once | INTRAVENOUS | Status: AC
  Administered 2016-08-07: 500 mL via INTRAVENOUS

## 2016-08-07 MED ORDER — METHOTREXATE SODIUM CHEMO INJECTION (PF) 50 MG/2ML: 12.5000 mg | INTRAMUSCULAR | Status: DC

## 2016-08-07 MED ORDER — POLYETHYLENE GLYCOL 3350 17 G PO PACK: 17.0000 g | PACK | Freq: Every day | ORAL | Status: DC | PRN

## 2016-08-07 MED ORDER — ONDANSETRON HCL 4 MG/2ML IJ SOLN: 4.0000 mg | Freq: Four times a day (QID) | INTRAMUSCULAR | Status: DC | PRN

## 2016-08-07 MED ORDER — MORPHINE SULFATE (PF) 4 MG/ML IV SOLN: 2.0000 mg | INTRAVENOUS | Status: DC | PRN

## 2016-08-07 NOTE — H&P (Signed)
History and Physical  Patient Name: [name]     [id number]    DOB: June 24, 1956    DOA: 08/07/2016 PCP: [name], MD   Patient coming from: Home  Chief Complaint: Abdominal pain, nausea, diarrhea  HPI: KEON WALTERMIRE is a 61 y.o. female with a past medical history significant for blindness, Marfan's, severe RA on methotrexate and abatacept/T-cell inhibitor, CHF EF 40%, old CVA, Aflutter s/p ablation not on [facility], and diverticulitis s/p colectomy in 2014 with colostomy who presents with abdominal pain for 2 days.  The patient was in her usual state of health until about 2 days ago when she developed some right lower quadrant abdominal pain. The pain is constant, moderate to severe, and associated with nausea, increased loose output in her ostomy, mild fever, and possible red blood in her ostomy intermittently, but no vomiting, chills, rigidity, distention. Today the patient that she had a bowel obstruction again, so she came to the ER.  ED course: -Afebrile, heart rate 104, respirations pulse ox normal, blood pressure 169/97 -Na 137, K 3.6, Cr 0.8, WBC 11.1 K, Hgb 11.9 -Lipase normal -Total bilirubin 1.4 -Urinalysis without pyuria or hematuria -CT of the abdomen and pelvis with IV contrast showed right colon colitis with intramural abscess (nothing drainable) versus malignancy, without perforation, an old ventral hernia, colostomy, and a complex 2.3 cm left renal mass -She was given fluids, cipro and flagyl and TRH were asked to evaluate     The patient had a perforated diverticulitis in June 2014, underwent emergency Hartmann colectomy, has had colostomy since then.  She has seen a Psychologist, sport and exercise in [place] about reversal and ventral hernia repair, but needed a colonoscopy first and an echo before that.  She has had the echo, but never called back to get the colonoscopy, and is meaning to do that at some point.     ROS: Review of Systems  Constitutional: Positive for fever  (maybe?). Negative for chills and malaise/fatigue.  Gastrointestinal: Positive for abdominal pain, blood in stool, diarrhea and nausea. Negative for melena and vomiting.  All other systems reviewed and are negative.         Past Medical History:  Diagnosis Date  . Atrial flutter ([place])   . Chronic gout 2008  . Diverticulitis of large intestine with perforation   . Fibroid   . Hearing aid worn    pt wears bilateral hearing aids  . Hernia 4/08  . Hypertension   . Legally blind    since pt was a teenager  . Marfan's syndrome affecting skin    with scolosis  . Osteoporosis   . Rheumatoid arthritis [facility])    sees Dr. Gavin Pound   . SBO (small bowel obstruction) 01/05/2013  . Small bowel obstruction due to adhesions   . Status post bunionectomy 1/10   bilateral   . Stroke ([place]) 2/08  . 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 ABLATION  01/04/14   atrial flutter ablation (2 circuits) by Dr Lovena Le  . ATRIAL FLUTTER ABLATION N/A 01/04/2014   Procedure: ATRIAL FLUTTER ABLATION;  Surgeon: Evans Lance, MD;  Location: [facility] CATH LAB;  Service: Cardiovascular;  Laterality: N/A;  . BUNIONECTOMY Bilateral 1/10  . COLON SURGERY     colectomy  . COLOSTOMY  11/27/2012  . HERNIA REPAIR    . LAPAROSCOPIC  ABDOMINAL EXPLORATION N/A 01/06/2013   Procedure: LAPAROSCOPIC ABDOMINAL EXPLORATION;  Surgeon: Adin Hector, MD;  Location: [facility] ORS;  Service: General;  Laterality: N/A;  . LAPAROSCOPIC LYSIS OF ADHESIONS N/A 01/06/2013   Procedure: LAPAROSCOPIC LYSIS OF ADHESIONS/ INTEROTOMY REPAIR;  Surgeon: Adin Hector, MD;  Location: [facility] ORS;  Service: General;  Laterality: N/A;  . LAPAROTOMY N/A 11/27/2012   Procedure: EXPLORATORY LAPAROTOMY SIGMOID COLECTOMY, COLOSTOMY;  Surgeon: Madilyn Hook, DO;  Location: [facility] ORS;  Service: General;  Laterality: N/A;  . TOTAL KNEE ARTHROPLASTY  Bilateral 6/08    Social History: Patient lives alone.  The patient walks with a walker in her house, WC outside it sometimes.  She is a former smoker, used to use alcohol.  Daughter lives nearby.  Has an aide that comes daily.  From [facility] originally.    No Known Allergies  Family history: No family history of heart disease.  Prior to Admission medications   Medication Sig Start Date End Date Taking? Authorizing Provider  Abatacept (ORENCIA) 125 MG/ML SOSY Inject 1 application into the skin every Monday.    Yes Historical Provider, MD  albuterol (PROVENTIL HFA;VENTOLIN HFA) 108 (90 BASE) MCG/ACT inhaler Inhale 1 puff into the lungs every 4 (four) hours as needed for wheezing or shortness of breath. 12/29/13  Yes [name], MD  allopurinol (ZYLOPRIM) 100 MG tablet take 1 tablet by mouth once daily 07/10/16  Yes [name], MD  amLODipine (NORVASC) 10 MG tablet take 1 tablet by mouth once daily 07/10/16  Yes [name], MD  aspirin EC 81 MG tablet Take 1 tablet (81 mg total) by mouth daily. 05/11/14  Yes Verlee Monte, MD  betamethasone valerate ointment (VALISONE) 0.1 % Apply a small amount topically 2 x a day for 1-2 weeks Patient taking differently: Apply 1 application topically 2 (two) times daily as needed (inflammation). Apply a small amount topically 2 x a day for 1-2 weeks 02/20/16  Yes Salvadore Dom, MD  Calcium Carbonate-Vitamin D (CALCIUM + D PO) Take 2.5 tablets by mouth daily.    Yes Historical Provider, MD  cetirizine (ZYRTEC) 10 MG tablet Take 1 tablet (10 mg total) by mouth daily. 11/17/14  Yes [name], MD  diazepam (VALIUM) 2 MG tablet Take 1 tablet (2 mg total) by mouth every 12 (twelve) hours as needed (dizziness). 12/26/15  Yes [name], MD  docusate sodium (COLACE) 100 MG capsule Take 100 mg by mouth 2 (two) times daily.   Yes Historical Provider, MD  folic acid (FOLVITE) 1 MG tablet Take 1 mg by mouth daily.   Yes Historical Provider, MD    HYDROcodone-acetaminophen (NORCO) 10-325 MG tablet Take 1 tablet by mouth every 6 (six) hours as needed for severe pain. 05/14/16  Yes [name], MD  ketoconazole (NIZORAL) 2 % cream Apply 1 application topically 2 (two) times daily. Patient taking differently: Apply 1 application topically 2 (two) times daily as needed for irritation.  08/10/13  Yes [name], MD  megestrol (MEGACE ES) [phone] MG/5ML suspension take 5 milliliters by mouth twice a day 07/10/16  Yes [name], MD  Methotrexate Sodium (METHOTREXATE, PF,) 50 MG/2ML injection Inject 0.5 mLs into the muscle every Monday. 05/14/16  Yes Historical Provider, MD  Multiple Vitamin (MULTIVITAMIN WITH MINERALS) TABS tablet Take 1 tablet by mouth daily.   Yes Historical Provider, MD  NEXIUM 40 MG capsule Take 1 capsule (40 mg total) by mouth 2 (two)  times daily. Patient taking differently: Take 40 mg by mouth daily.  09/05/15  Yes [name], MD  ondansetron (ZOFRAN) 4 MG tablet Take 1 tablet (4 mg total) by mouth every 8 (eight) hours as needed for nausea or vomiting. 06/20/15  Yes Domenic Moras, PA-C  oxyCODONE-acetaminophen (PERCOCET) 5-325 MG tablet Take 1-2 tablets by mouth every 4 (four) hours as needed. Patient taking differently: Take 1-2 tablets by mouth every 4 (four) hours as needed for moderate pain or severe pain.  06/29/16  Yes Nat Christen, MD  polyethylene glycol [facility] / GLYCOLAX) packet Take 17 g by mouth daily. Patient taking differently: Take 17 g by mouth daily as needed for moderate constipation.  11/11/15  Yes [name], MD  risedronate (ACTONEL) 150 MG tablet Take 1 tablet (150 mg total) by mouth every 30 (thirty) days. with water on empty stomach, nothing by mouth or lie down for next 30 minutes. 08/05/15  Yes [name], MD  tiZANidine (ZANAFLEX) 4 MG tablet take 1 tablet by mouth twice a day Patient taking differently: take 4mg  tablet by mouth twice a day 06/22/16  Yes [name], MD  traMADol Veatrice Bourbon) 50  MG tablet take 1-2 tablets by mouth every 6 hours if needed Patient taking differently: take 50-100mg  tablets by mouth every 6 hours if needed for pain 02/21/16  Yes [name], MD  vitamin C (ASCORBIC ACID) 500 MG tablet Take 500 mg by mouth daily.    Yes Historical Provider, MD       Physical Exam: BP 133/75 (BP Location: Left Arm)   Pulse (!) 101   Temp 99.5 F (37.5 C) (Oral)   Resp 17   Ht 6\' 1"  (1.854 m)   Wt 58.1 kg (128 lb)   LMP 06/25/2006 (LMP Unknown)   SpO2 98%   BMI 16.89 kg/m  General appearance: Very thin elderly adult female, alert and in no acute distress.   Eyes: Blind.  Eyes closed. ENT: No nasal deformity, discharge, epistaxis.  Hearing normal. OP moist without lesions.  Partially edentulous. Neck: No neck masses.  Trachea midline.  No thyromegaly/tenderness. Lymph: No cervical or supraclavicular lymphadenopathy. Skin: Warm and dry.   No suspicious rashes or lesions. Cardiac: RRR, nl S1-S2, no murmurs appreciated.  JVP normal.  No LE edema.  Radial and DP pulses 2+ and symmetric. Respiratory: Normal respiratory rate and rhythm.  CTAB without rales or wheezes. Abdomen: Abdomen soft.  Moderate to severe TTP in Right abdomen, no rebound, no rigidity.  Her ventral hernia is very soft, not tender. No ascites, distension, splenomegaly.   MSK: Severe ulnar dev deformities and joint erosions throughout both wrists/hands from RA. Charcot ankles.  Diffuse loss of muscle mass.  No cyanosis or clubbing. Neuro: Sensation intact to light touch. Speech is fluent.  Muscle strength symmetric.    Psych: Sensorium intact and responding to questions, attention normal.  Behavior appropriate.  Affect normal.  Judgment and insight appear normal.     Labs on Admission:  I have personally reviewed following labs and imaging studies: CBC:  Recent Labs Lab 08/07/16 1653  WBC 11.1*  NEUTROABS 10.0*  HGB 11.9*  HCT 35.8*  MCV 93.0  PLT [id number]   Basic Metabolic Panel:  Recent  Labs Lab 08/07/16 1653  NA 137  K 3.6  CL 102  CO2 26  GLUCOSE 111*  BUN 8  CREATININE 0.80  CALCIUM 9.3   GFR: Estimated Creatinine Clearance: 68.6 mL/min (by C-G formula  based on SCr of 0.8 mg/dL).  Liver Function Tests:  Recent Labs Lab 08/07/16 1653  AST 21  ALT 14  ALKPHOS 86  BILITOT 1.4*  PROT 8.0  ALBUMIN 4.3    Recent Labs Lab 08/07/16 1653  LIPASE 22   No results for input(s): AMMONIA in the last 168 hours. Coagulation Profile: No results for input(s): INR, PROTIME in the last 168 hours. Cardiac Enzymes: No results for input(s): CKTOTAL, CKMB, CKMBINDEX, TROPONINI in the last 168 hours. BNP (last 3 results) No results for input(s): PROBNP in the last 8760 hours. HbA1C: No results for input(s): HGBA1C in the last 72 hours. CBG: No results for input(s): GLUCAP in the last 168 hours. Lipid Profile: No results for input(s): CHOL, HDL, LDLCALC, TRIG, CHOLHDL, LDLDIRECT in the last 72 hours. Thyroid Function Tests: No results for input(s): TSH, T4TOTAL, FREET4, T3FREE, THYROIDAB in the last 72 hours. Anemia Panel: No results for input(s): VITAMINB12, FOLATE, FERRITIN, TIBC, IRON, RETICCTPCT in the last 72 hours. Sepsis Labs: Invalid input(s): PROCALCITONIN, LACTICIDVEN No results found for this or any previous visit (from the past 240 hour(s)).       Radiological Exams on Admission: Personally reviewed CT abdomen report, discussed with Radiology: Ct Abdomen Pelvis W Contrast  Result Date: 08/07/2016 CLINICAL DATA:  61 year old hypertensive female with abdominal pain and diarrhea. History of small-bowel obstruction. Post colonic resection with colostomy. Initial encounter. EXAM: CT ABDOMEN AND PELVIS WITH CONTRAST TECHNIQUE: Multidetector CT imaging of the abdomen and pelvis was performed using the standard protocol following bolus administration of intravenous contrast. CONTRAST:  111mL ISOVUE-300 IOPAMIDOL (ISOVUE-300) INJECTION 61% COMPARISON:  Several  prior exams, most recent 06/20/2015. FINDINGS: Lower chest: Sub pleural nodule right middle lobe stable since 2014. Hepatobiliary: Liver cysts without significant change. No calcified gallstones. Pancreas: No pancreatic mass noted. Mild prominence pancreatic duct noted on prior exams. Spleen: No splenic lesion or enlargement. Adrenals/Urinary Tract: Complex 2.3 cm mass arises from the posterosuperior aspect of left kidney. This measured 9 mm in 2014 and therefore may represent malignancy (versus complex hemorrhagic cyst). This can be assessed with dedicated renal MR. Nonobstructing left upper pole renal calculus.  No adrenal mass. Stomach/Bowel: Markedly abnormal appearance of the right colon which is diffusely inflamed. Etiology indeterminate. This may represent a colitis/ diverticulitis with intramural abscess but without surrounding drainable abscess. Underline malignancy not excluded. Small amount of radiopaque material in this region but not having an appearance of sharp object. Appendix spared. Ventral wall hernia containing small and large bowel. The right colon abnormality is just proximal to this ventral hernia. Colostomy left lower quadrant. No free air. Vascular/Lymphatic: Carotid artery calcifications. Calcifications aorta a iliac arteries with ectasia. Reproductive: Question small fibroid. Other: Prominent urinary bladder with diverticulum to the right. No obvious bladder mass although contrast filled views not obtained. Musculoskeletal: Significant scoliosis convex left. Degenerative changes hip joints. Remote right ischium fracture. IMPRESSION: Markedly abnormal appearance of the right colon which is diffusely inflamed. Etiology indeterminate. This may represent a colitis/ diverticulitis with intramural abscess but without surrounding drainable abscess. Underline malignancy not excluded. Small radiopaque material in this region but not having an appearance of sharp object (as would be expected if this  was cause of the bowel abnormality). Appendix spared. Ventral wall hernia containing small and large bowel. The right colon abnormality is just proximal to this ventral hernia. Colostomy left lower quadrant. Complex 2.3 cm mass arises from the posterosuperior aspect of left kidney. This measured 9 mm in 2014 and therefore  may represent malignancy (versus complex hemorrhagic cyst). This can be assessed with dedicated renal MR. Aortic atherosclerosis.  Carotid artery calcifications. Marked scoliosis and superimposed degenerative changes. These results were called by telephone at the time of interpretation on 08/07/2016 at 6:08 pm to [facility] PA , who verbally acknowledged these results. Electronically Signed   By: Genia Del M.D.   On: 08/07/2016 18:16    Echocardiogram 03/2016: EF 40-45% Grade 2 DD    Assessment/Plan  1. Complicated diverticulitis:  CT abdomen discussed with Radiology, diverticulitis favored even in right colon.  Fits clinical picture (subjective fevers, aching pain, borderline leukocytosis).  -Bowel rest, clears only -Very gentle IVF -Strict I/Os -Morphine for pain -Cipro IV and Flagyl -Check FOBT    2. Rheumatoid arthritis:  Orencia and methotrexate given yesterday.  We will not re-order these for now while being treated for infection.  3. Hypertension:  -Continue amlodipine  4. Normocytic anemia:  Stable  5. Severe protein calorie malnutrition:  -Hold Megace while on clears  6. Other medications:  -Continue PPI -Continue folate -Continue allopurinol -Continue PRNs Norco and Tramadol and Zanaflex if needed  7. Renal mass: This has been present in past, is followed by Dr. Diona Fanti from Urology.  Imaging findings discussed with patient and daughter, who have follow up with Urology soon. -Per Urology  8. Elevated total bilirubin: Suspect from colitis, diverticulitis. -Check fractionated bilirubin, trend LFTs  9. Chronic systolic and diastolic  CHF: EF [id number]. Appears euvolemic now, not on furosemide at baseline. -STrict I/Os, daily weights      DVT prophylaxis: Lovenox  Code Status: FULL  Family Communication: Daughter at bedside  Disposition Plan: Anticipate IV antibiotics and fluids. Consults called: None Admission status: OBS At the point of initial evaluation, it is my clinical opinion that admission for OBSERVATION is reasonable and necessary because the patient's presenting complaints in the context of their chronic conditions represent sufficient risk of deterioration or significant morbidity to constitute reasonable grounds for close observation in the hospital setting, but that the patient may be medically stable for discharge from the hospital within 24 to 48 hours.    Medical decision making: Patient seen at 7:34 PM on 08/07/2016.  The patient was discussed with Will Dansie, PA-C.  What exists of the patient's chart was reviewed in depth and summarized above.  Clinical condition: stable.        Edwin Dada [facility] Pager [phone]

## 2016-08-07 NOTE — ED Triage Notes (Signed)
Patient is complaining of abdominal pain x 2 days. Reports diarrhea. Denies nausea/vomiting. Patient is supposed to have surgery on a abdominal hernia; not scheduled. Last time she had a pain like this, patient states she had a bowel blockage. Patient has a colostomy. Patient is from home.

## 2016-08-07 NOTE — ED Provider Notes (Signed)
[facility] DEPT Provider Note   CSN: [id number] Arrival date & time: 08/07/16  1613     History   Chief Complaint Chief Complaint  Patient presents with  . Abdominal Pain  . Hernia    HPI [name] is a 61 y.o. female.  [name] is a 61 y.o. Female with a history of Marfan's, CHF with most recent echo with an ejection fraction of 40-45%, diverticulitis with colostomy, legally blind, hypertension, and remote stroke, who presents to the ED complaining of lower abdominal pain starting two days ago. She reports some loose stool in her colostomy bag yesterday. She does have stool from her rectum. She has had a colostomy since 2014. She has had a previous SBO. She denies nausea or vomiting. She reports pain to her ventral hernia for several months. She denies fevers, cough, chest pain, SOB, nausea, vomiting, urinary symptoms, rashes, or hematochezia.    The history is provided by the patient and medical records. No language interpreter was used.  Abdominal Pain   Associated symptoms include diarrhea. Pertinent negatives include fever, nausea, vomiting, dysuria, hematuria and headaches.    Past Medical History:  Diagnosis Date  . Atrial flutter ([place])   . Chronic gout 2008  . Diverticulitis of large intestine with perforation   . Fibroid   . Hearing aid worn    pt wears bilateral hearing aids  . Hernia 4/08  . Hypertension   . Legally blind    since pt was a teenager  . Marfan's syndrome affecting skin    with scolosis  . Osteoporosis   . Rheumatoid arthritis [facility])    sees Dr. Gavin Pound   . SBO (small bowel obstruction) 01/05/2013  . Small bowel obstruction due to adhesions   . Status post bunionectomy 1/10   bilateral   . Stroke ([place]) 2/08  . 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 Colitis 08/07/2016  . Chronic systolic CHF  (congestive heart failure) ([place])   . Hyperglycemia   . Ventral hernia without obstruction or gangrene 06/06/2015  . Renal mass   . SBO (small bowel obstruction) ([place])   . Small bowel obstruction 05/01/2014  . Pulmonary HTN ([place]) 01/01/2014  . Tachycardia 01/01/2014  . Acute renal failure ([place]) 01/01/2014  . Iron deficiency anemia 01/01/2014  . Atrial flutter ([place]) 12/31/2013  . Encounter for ostomy care education 11/30/2013  . Stoma dermatitis 11/30/2013  . Incisional hernia 11/30/2013  . Constipation, chronic 11/30/2013  . Charcot's joint of foot 07/07/2013  . Bilateral leg edema 04/20/2013  . 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 Rheumatoid arthritis ([place]) 06/07/2009  . THYROID NODULE 07/01/2007  . LUNG NODULE 07/01/2007  . Osteoporosis 05/07/2007  . 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 Blindness 04/12/2007  . HEMORRHOIDS, INTERNAL 04/12/2007  . 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 HYPERLIPIDEMIA 01/07/2007  . Essential hypertension 01/07/2007  . GERD 01/07/2007  . Marfan's syndrome 01/07/2007    Past Surgical History:  Procedure Laterality Date  . ABLATION  01/04/14   atrial flutter ablation (2 circuits) by Dr Lovena Le  . ATRIAL FLUTTER ABLATION N/A 01/04/2014   Procedure: ATRIAL FLUTTER ABLATION;  Surgeon: Evans Lance, MD;  Location: [facility] CATH LAB;  Service: Cardiovascular;  Laterality: N/A;  . BUNIONECTOMY Bilateral 1/10  . COLON SURGERY  colectomy  . COLOSTOMY  11/27/2012  . HERNIA REPAIR    . LAPAROSCOPIC ABDOMINAL EXPLORATION N/A 01/06/2013   Procedure: LAPAROSCOPIC ABDOMINAL EXPLORATION;  Surgeon: Adin Hector, MD;  Location: [facility] ORS;  Service: General;  Laterality: N/A;  . LAPAROSCOPIC LYSIS OF ADHESIONS N/A 01/06/2013   Procedure: LAPAROSCOPIC LYSIS OF ADHESIONS/ INTEROTOMY REPAIR;  Surgeon: Adin Hector, MD;  Location: [facility] ORS;  Service: General;  Laterality: N/A;  . LAPAROTOMY N/A 11/27/2012   Procedure:  EXPLORATORY LAPAROTOMY SIGMOID COLECTOMY, COLOSTOMY;  Surgeon: Madilyn Hook, DO;  Location: [facility] ORS;  Service: General;  Laterality: N/A;  . TOTAL KNEE ARTHROPLASTY Bilateral 6/08    OB History    Gravida Para Term Preterm AB Living   1 1 1     1    SAB TAB Ectopic Multiple Live Births           1       Home Medications    Prior to Admission medications   Medication Sig Start Date End Date Taking? Authorizing Provider  Abatacept (ORENCIA) 125 MG/ML SOSY Inject 1 application into the skin every Monday.    Yes Historical Provider, MD  albuterol (PROVENTIL HFA;VENTOLIN HFA) 108 (90 BASE) MCG/ACT inhaler Inhale 1 puff into the lungs every 4 (four) hours as needed for wheezing or shortness of breath. 12/29/13  Yes [name], MD  allopurinol (ZYLOPRIM) 100 MG tablet take 1 tablet by mouth once daily 07/10/16  Yes [name], MD  amLODipine (NORVASC) 10 MG tablet take 1 tablet by mouth once daily 07/10/16  Yes [name], MD  aspirin EC 81 MG tablet Take 1 tablet (81 mg total) by mouth daily. 05/11/14  Yes Verlee Monte, MD  betamethasone valerate ointment (VALISONE) 0.1 % Apply a small amount topically 2 x a day for 1-2 weeks Patient taking differently: Apply 1 application topically 2 (two) times daily as needed (inflammation). Apply a small amount topically 2 x a day for 1-2 weeks 02/20/16  Yes Salvadore Dom, MD  Calcium Carbonate-Vitamin D (CALCIUM + D PO) Take 2.5 tablets by mouth daily.    Yes Historical Provider, MD  cetirizine (ZYRTEC) 10 MG tablet Take 1 tablet (10 mg total) by mouth daily. 11/17/14  Yes [name], MD  diazepam (VALIUM) 2 MG tablet Take 1 tablet (2 mg total) by mouth every 12 (twelve) hours as needed (dizziness). 12/26/15  Yes [name], MD  docusate sodium (COLACE) 100 MG capsule Take 100 mg by mouth 2 (two) times daily.   Yes Historical Provider, MD  folic acid (FOLVITE) 1 MG tablet Take 1 mg by mouth daily.   Yes Historical Provider, MD    HYDROcodone-acetaminophen (NORCO) 10-325 MG tablet Take 1 tablet by mouth every 6 (six) hours as needed for severe pain. 05/14/16  Yes [name], MD  ketoconazole (NIZORAL) 2 % cream Apply 1 application topically 2 (two) times daily. Patient taking differently: Apply 1 application topically 2 (two) times daily as needed for irritation.  08/10/13  Yes [name], MD  megestrol (MEGACE ES) [phone] MG/5ML suspension take 5 milliliters by mouth twice a day 07/10/16  Yes [name], MD  Methotrexate Sodium (METHOTREXATE, PF,) 50 MG/2ML injection Inject 0.5 mLs into the muscle every Monday. 05/14/16  Yes Historical Provider, MD  Multiple Vitamin (MULTIVITAMIN WITH MINERALS) TABS tablet Take 1 tablet by mouth daily.   Yes Historical Provider, MD  NEXIUM 40 MG capsule Take 1 capsule (40 mg  total) by mouth 2 (two) times daily. Patient taking differently: Take 40 mg by mouth daily.  09/05/15  Yes [name], MD  ondansetron (ZOFRAN) 4 MG tablet Take 1 tablet (4 mg total) by mouth every 8 (eight) hours as needed for nausea or vomiting. 06/20/15  Yes Domenic Moras, PA-C  oxyCODONE-acetaminophen (PERCOCET) 5-325 MG tablet Take 1-2 tablets by mouth every 4 (four) hours as needed. Patient taking differently: Take 1-2 tablets by mouth every 4 (four) hours as needed for moderate pain or severe pain.  06/29/16  Yes Nat Christen, MD  polyethylene glycol [facility] / GLYCOLAX) packet Take 17 g by mouth daily. Patient taking differently: Take 17 g by mouth daily as needed for moderate constipation.  11/11/15  Yes [name], MD  risedronate (ACTONEL) 150 MG tablet Take 1 tablet (150 mg total) by mouth every 30 (thirty) days. with water on empty stomach, nothing by mouth or lie down for next 30 minutes. 08/05/15  Yes [name], MD  tiZANidine (ZANAFLEX) 4 MG tablet take 1 tablet by mouth twice a day Patient taking differently: take 4mg  tablet by mouth twice a day 06/22/16  Yes [name], MD  traMADol Veatrice Bourbon) 50  MG tablet take 1-2 tablets by mouth every 6 hours if needed Patient taking differently: take 50-100mg  tablets by mouth every 6 hours if needed for pain 02/21/16  Yes [name], MD  vitamin C (ASCORBIC ACID) 500 MG tablet Take 500 mg by mouth daily.    Yes Historical Provider, MD    Family History Family History  Problem Relation Age of Onset  . Heart attack Neg Hx     Social History Social History  Substance Use Topics  . Smoking status: Former Smoker    Packs/day: 1.00    Types: Cigarettes    Quit date: 11/27/2012  . Smokeless tobacco: Never Used  . Alcohol use No     Comment: recovering alcoholic sober since 0000000     Allergies   Patient has no known allergies.   Review of Systems Review of Systems  Constitutional: Negative for chills and fever.  HENT: Negative for congestion and sore throat.   Eyes: Negative for visual disturbance.  Respiratory: Negative for cough and shortness of breath.   Cardiovascular: Negative for chest pain.  Gastrointestinal: Positive for abdominal pain and diarrhea. Negative for blood in stool, nausea and vomiting.  Genitourinary: Negative for difficulty urinating, dysuria and hematuria.  Musculoskeletal: Negative for back pain and neck pain.  Skin: Negative for rash.  Neurological: Negative for headaches.     Physical Exam Updated Vital Signs BP 133/75 (BP Location: Left Arm)   Pulse (!) 101   Temp 99.5 F (37.5 C) (Oral)   Resp 17   Ht 6\' 1"  (1.854 m)   Wt 58.1 kg   LMP 06/25/2006 (LMP Unknown)   SpO2 98%   BMI 16.89 kg/m   Physical Exam  Constitutional: She appears well-nourished. No distress.  Nontoxic appearing.  HENT:  Head: Atraumatic.  Right Ear: External ear normal.  Left Ear: External ear normal.  Mouth/Throat: Oropharynx is clear and moist.  Eyes: Conjunctivae are normal. Pupils are equal, round, and reactive to light. Right eye exhibits no discharge. Left eye exhibits no discharge.  Neck: Neck supple.   Cardiovascular: Normal rate, regular rhythm, normal heart sounds and intact distal pulses.  Exam reveals no gallop and no friction rub.   No murmur heard. Pulmonary/Chest: Effort normal and breath sounds normal.  No respiratory distress. She has no wheezes. She has no rales.  Abdominal: Soft. Bowel sounds are normal. She exhibits no distension. There is tenderness. There is no rebound and no guarding. A hernia is present.  Abdomen is soft. Bowel sounds are present. Colostomy bag in place to her left abdomen. Large ventral hernia present. No palpable hard masses. Mild tenderness noted to her bilateral lower abdomen. No peritoneal signs.  Musculoskeletal: She exhibits no edema.  Lymphadenopathy:    She has no cervical adenopathy.  Neurological: She is alert. Coordination normal.  Skin: Skin is warm and dry. Capillary refill takes less than 2 seconds. No rash noted. She is not diaphoretic. No erythema. No pallor.  Psychiatric: She has a normal mood and affect. Her behavior is normal.  Nursing note and vitals reviewed.    ED Treatments / Results  Labs (all labs ordered are listed, but only abnormal results are displayed) Labs Reviewed  COMPREHENSIVE METABOLIC PANEL - Abnormal; Notable for the following:       Result Value   Glucose, Bld 111 (*)    Total Bilirubin 1.4 (*)    All other components within normal limits  CBC WITH DIFFERENTIAL/PLATELET - Abnormal; Notable for the following:    WBC 11.1 (*)    RBC 3.85 (*)    Hemoglobin 11.9 (*)    HCT 35.8 (*)    Neutro Abs 10.0 (*)    Lymphs Abs 0.5 (*)    All other components within normal limits  URINALYSIS, ROUTINE W REFLEX MICROSCOPIC - Abnormal; Notable for the following:    Color, Urine STRAW (*)    Specific Gravity, Urine 1.003 (*)    Leukocytes, UA TRACE (*)    Bacteria, UA RARE (*)    Squamous Epithelial / LPF 0-5 (*)    All other components within normal limits  LIPASE, BLOOD  COMPREHENSIVE METABOLIC PANEL  CBC  BILIRUBIN,  FRACTIONATED(TOT/DIR/INDIR)    EKG  EKG Interpretation None       Radiology Ct Abdomen Pelvis W Contrast  Result Date: 08/07/2016 CLINICAL DATA:  61 year old hypertensive female with abdominal pain and diarrhea. History of small-bowel obstruction. Post colonic resection with colostomy. Initial encounter. EXAM: CT ABDOMEN AND PELVIS WITH CONTRAST TECHNIQUE: Multidetector CT imaging of the abdomen and pelvis was performed using the standard protocol following bolus administration of intravenous contrast. CONTRAST:  110mL ISOVUE-300 IOPAMIDOL (ISOVUE-300) INJECTION 61% COMPARISON:  Several prior exams, most recent 06/20/2015. FINDINGS: Lower chest: Sub pleural nodule right middle lobe stable since 2014. Hepatobiliary: Liver cysts without significant change. No calcified gallstones. Pancreas: No pancreatic mass noted. Mild prominence pancreatic duct noted on prior exams. Spleen: No splenic lesion or enlargement. Adrenals/Urinary Tract: Complex 2.3 cm mass arises from the posterosuperior aspect of left kidney. This measured 9 mm in 2014 and therefore may represent malignancy (versus complex hemorrhagic cyst). This can be assessed with dedicated renal MR. Nonobstructing left upper pole renal calculus.  No adrenal mass. Stomach/Bowel: Markedly abnormal appearance of the right colon which is diffusely inflamed. Etiology indeterminate. This may represent a colitis/ diverticulitis with intramural abscess but without surrounding drainable abscess. Underline malignancy not excluded. Small amount of radiopaque material in this region but not having an appearance of sharp object. Appendix spared. Ventral wall hernia containing small and large bowel. The right colon abnormality is just proximal to this ventral hernia. Colostomy left lower quadrant. No free air. Vascular/Lymphatic: Carotid artery calcifications. Calcifications aorta a iliac arteries with ectasia. Reproductive: Question small fibroid. Other: Prominent  urinary bladder with diverticulum to the right. No obvious bladder mass although contrast filled views not obtained. Musculoskeletal: Significant scoliosis convex left. Degenerative changes hip joints. Remote right ischium fracture. IMPRESSION: Markedly abnormal appearance of the right colon which is diffusely inflamed. Etiology indeterminate. This may represent a colitis/ diverticulitis with intramural abscess but without surrounding drainable abscess. Underline malignancy not excluded. Small radiopaque material in this region but not having an appearance of sharp object (as would be expected if this was cause of the bowel abnormality). Appendix spared. Ventral wall hernia containing small and large bowel. The right colon abnormality is just proximal to this ventral hernia. Colostomy left lower quadrant. Complex 2.3 cm mass arises from the posterosuperior aspect of left kidney. This measured 9 mm in 2014 and therefore may represent malignancy (versus complex hemorrhagic cyst). This can be assessed with dedicated renal MR. Aortic atherosclerosis.  Carotid artery calcifications. Marked scoliosis and superimposed degenerative changes. These results were called by telephone at the time of interpretation on 08/07/2016 at 6:08 pm to [facility] PA , who verbally acknowledged these results. Electronically Signed   By: Genia Del M.D.   On: 08/07/2016 18:16    Procedures Procedures (including critical care time)  Medications Ordered in ED Medications  metroNIDAZOLE (FLAGYL) tablet 500 mg (not administered)  allopurinol (ZYLOPRIM) tablet 100 mg (not administered)  amLODipine (NORVASC) tablet 10 mg (not administered)  methotrexate (PF) chemo injection 12.5 mg (not administered)  tiZANidine (ZANAFLEX) tablet 4 mg (not administered)  HYDROcodone-acetaminophen (NORCO) 10-325 MG per tablet 1 tablet (not administered)  traMADol (ULTRAM) tablet 50-100 mg (not administered)  polyethylene glycol (MIRALAX /  GLYCOLAX) packet 17 g (not administered)  pantoprazole (PROTONIX) EC tablet 80 mg (not administered)  aspirin EC tablet 81 mg (not administered)  folic acid (FOLVITE) tablet 1 mg (not administered)  enoxaparin (LOVENOX) injection 40 mg (not administered)  0.9 %  sodium chloride infusion (not administered)  ondansetron (ZOFRAN) tablet 4 mg (not administered)    Or  ondansetron (ZOFRAN) injection 4 mg (not administered)  morphine 4 MG/ML injection 2 mg (not administered)  ciprofloxacin (CIPRO) IVPB 400 mg (not administered)  sodium chloride 0.9 % bolus 500 mL (0 mLs Intravenous Stopped 08/07/16 1844)  iopamidol (ISOVUE-300) 61 % injection 100 mL (100 mLs Intravenous Contrast Given 08/07/16 1733)  ciprofloxacin (CIPRO) IVPB 400 mg (0 mg Intravenous Stopped 08/07/16 1955)  fentaNYL (SUBLIMAZE) injection 50 mcg (50 mcg Intravenous Given 08/07/16 1955)     Initial Impression / Assessment and Plan / ED Course  I have reviewed the triage vital signs and the nursing notes.  Pertinent labs & imaging results that were available during my care of the patient were reviewed by me and considered in my medical decision making (see chart for details).    This  is a 61 y.o. Female with a history of Marfan's, CHF with most recent echo with an ejection fraction of 40-45%, diverticulitis with colostomy, legally blind, hypertension, and remote stroke, who presents to the ED complaining of lower abdominal pain starting two days ago. She reports some loose stool in her colostomy bag yesterday. She does have stool from her rectum. She has had a colostomy since 2014. She has had a previous SBO. She denies nausea or vomiting. On exam the patient is afebrile nontoxic appearing. Abdomen is soft and she has tenderness across her bilateral lower abdomen. Large ventral hernia present. No palpable masses identified. Colostomy bag is in place to her left lower abdomen. No evidence of  surrounding infection.  Lipase and CMP are  unremarkable. CBC is remarkable for a white count of 11,100. CT abdomen and pelvis was obtained which showed abnormal appearance of the right colon which is diffusely inflamed. Radiology is unsure of the etiology. May represent colitis or diverticulitis. Cannot rule out underlying malignancy. After a discussion with my attending, Dr. Laneta Simmers will go ahead and treat for colitis with cipro and flagyl and have her admitted to medicine for further work up. Patient agrees with plan.   I consulted with Dr. Loleta Books who accepted the patient for admission.   This patient was discussed with Dr. Laneta Simmers who agrees with assessment and plan.   Final Clinical Impressions(s) / ED Diagnoses   Final diagnoses:  Colitis    New Prescriptions Current Discharge Medication List       Waynetta Pean, PA-C 08/07/16 2147    Leo Grosser, MD 08/08/16 [phone]

## 2016-08-07 NOTE — ED Notes (Signed)
Pt daughter is at bedside 

## 2016-08-08 DIAGNOSIS — Z933 Colostomy status: Secondary | ICD-10-CM | POA: Diagnosis not present

## 2016-08-08 DIAGNOSIS — K5732 Diverticulitis of large intestine without perforation or abscess without bleeding: Secondary | ICD-10-CM | POA: Diagnosis not present

## 2016-08-08 DIAGNOSIS — H547 Unspecified visual loss: Secondary | ICD-10-CM | POA: Diagnosis not present

## 2016-08-08 DIAGNOSIS — K529 Noninfective gastroenteritis and colitis, unspecified: Secondary | ICD-10-CM

## 2016-08-08 DIAGNOSIS — I5022 Chronic systolic (congestive) heart failure: Secondary | ICD-10-CM

## 2016-08-08 DIAGNOSIS — M069 Rheumatoid arthritis, unspecified: Secondary | ICD-10-CM

## 2016-08-08 DIAGNOSIS — I10 Essential (primary) hypertension: Secondary | ICD-10-CM

## 2016-08-08 DIAGNOSIS — K432 Incisional hernia without obstruction or gangrene: Secondary | ICD-10-CM

## 2016-08-08 LAB — COMPREHENSIVE METABOLIC PANEL
ALT: 11 U/L — ABNORMAL LOW (ref 14–54)
AST: 15 U/L (ref 15–41)
Albumin: 3.3 g/dL — ABNORMAL LOW (ref 3.5–5.0)
Alkaline Phosphatase: 72 U/L (ref 38–126)
Anion gap: 6 (ref 5–15)
BUN: 8 mg/dL (ref 6–20)
CO2: 25 mmol/L (ref 22–32)
Calcium: 8.4 mg/dL — ABNORMAL LOW (ref 8.9–10.3)
Chloride: 108 mmol/L (ref 101–111)
Creatinine, Ser: 0.7 mg/dL (ref 0.44–1.00)
GFR calc Af Amer: >60 mL/min (ref >60)
GFR calc non Af Amer: >60 mL/min (ref >60)
Glucose, Bld: 105 mg/dL — ABNORMAL HIGH (ref 65–99)
Potassium: 3.4 mmol/L — ABNORMAL LOW (ref 3.5–5.1)
Sodium: 139 mmol/L (ref 135–145)
Total Bilirubin: 1 mg/dL (ref 0.3–1.2)
Total Protein: 6.2 g/dL — ABNORMAL LOW (ref 6.5–8.1)

## 2016-08-08 LAB — CBC
HCT: 30.6 % — ABNORMAL LOW (ref 36.0–46.0)
Hemoglobin: 10 g/dL — ABNORMAL LOW (ref 12.0–15.0)
MCH: 30.5 pg (ref 26.0–34.0)
MCHC: 32.7 g/dL (ref 30.0–36.0)
MCV: 93.3 fL (ref 78.0–100.0)
Platelets: 191 10*3/uL (ref 150–400)
RBC: 3.28 MIL/uL — ABNORMAL LOW (ref 3.87–5.11)
RDW: 14.7 % (ref 11.5–15.5)
WBC: 9.6 10*3/uL (ref 4.0–10.5)

## 2016-08-08 MED ORDER — BOOST / RESOURCE BREEZE PO LIQD: 1.0000 | Freq: Three times a day (TID) | ORAL | 0 refills | Status: AC

## 2016-08-08 MED ORDER — POTASSIUM CHLORIDE 20 MEQ/15ML (10%) PO SOLN
40.0000 meq | Freq: Once | ORAL | Status: AC
  Administered 2016-08-08: 40 meq via ORAL
  Filled 2016-08-08: qty 30

## 2016-08-08 MED ORDER — BOOST / RESOURCE BREEZE PO LIQD
1.0000 | Freq: Three times a day (TID) | ORAL | Status: DC
  Administered 2016-08-08: 1 via ORAL

## 2016-08-08 MED ORDER — CIPROFLOXACIN HCL 500 MG PO TABS: 500.0000 mg | ORAL_TABLET | Freq: Two times a day (BID) | ORAL | 0 refills | Status: AC

## 2016-08-08 MED ORDER — METRONIDAZOLE 500 MG PO TABS: 500.0000 mg | ORAL_TABLET | Freq: Three times a day (TID) | ORAL | 0 refills | Status: AC

## 2016-08-08 MED ORDER — CIPROFLOXACIN HCL 500 MG PO TABS
500.0000 mg | ORAL_TABLET | Freq: Two times a day (BID) | ORAL | Status: DC
  Administered 2016-08-08: 500 mg via ORAL
  Filled 2016-08-08: qty 1

## 2016-08-08 NOTE — Progress Notes (Signed)
Spoke with patient at bedside. Patient states she lives at home alone, she has an aide for 4 hr/day that assist with ADL's, cleaning, meals. Patient states her daughter manages her medication in a pill box, provides transportation as needed. Patient is legally blind, she states she manages well at her home, ambulates with a walker, feels safe in her home environment. She states her daughter will provide transportation at d/c but does not get off work until late. Will continue to follow for d/c needs. l

## 2016-08-08 NOTE — Progress Notes (Signed)
Initial Nutrition Assessment  DOCUMENTATION CODES:   Underweight  INTERVENTION:   Recommend resuming Megace order as patient is on a solid food diet now.  Provide Boost Breeze po TID, each supplement provides 250 kcal and 9 grams of protein Placed patient on meal order with assist Encourage PO intake RD to continue to monitor  NUTRITION DIAGNOSIS:   Inadequate oral intake related to poor appetite as evidenced by per patient/family report.  GOAL:   Patient will meet greater than or equal to 90% of their needs  MONITOR:   PO intake, Supplement acceptance, Labs, Weight trends, I & O's  REASON FOR ASSESSMENT:   Consult Assessment of nutrition requirement/status  ASSESSMENT:   61 y.o. female with a past medical history significant for blindness, Marfan's, severe RA on methotrexate and abatacept/T-cell inhibitor, CHF EF 40%, old CVA, Aflutter s/p ablation not on AC, and diverticulitis s/p colectomy in 2014 with colostomy who presents with abdominal pain for 2 days.  Patient in room with nurse tech. Nurse tech was preparing to help feed patient. Both noticed that the patient did not receive any meat on her house tray. Pt would like to pick her own options off the menu, placed pt on meal order with assist given pt is blind. RD also ordered a meat to be delivered for lunch. Pt states she is not that hungry right now d/t just finished a liquid tray.  Per chart review, pt has an aid that comes out to help cook meals at home. Pt receives Megace to help with appetite at home. Pt is willing to try Boost Breeze supplements. RD to order. Pt states Ensure makes her have gas.  Pt with Marfan Syndrome which makes feeding herself difficult. Pt with tall, thin frame. Did not attempt NFPE as patient was getting ready to eat but not sure if exam would reveal any signs of malnutrition and if it can be distinguished given Marfan syndrome. Will continue to monitor PO intake.  Medications: Folic acid  tablet daily Labs reviewed: Low K  Diet Order:  DIET SOFT Room service appropriate? Yes with Assist; Fluid consistency: Thin  Skin:  Reviewed, no issues  Last BM:  2/14  Height:   Ht Readings from Last 1 Encounters:  08/07/16 6\' 1"  (1.854 m)    Weight:   Wt Readings from Last 1 Encounters:  08/07/16 128 lb (58.1 kg)    Ideal Body Weight:  75 kg  BMI:  Body mass index is 16.89 kg/m.  Estimated Nutritional Needs:   Kcal:  1500-1700  Protein:  65-75g  Fluid:  1.7L/day  EDUCATION NEEDS:   No education needs identified at this time  Clayton Bibles, MS, RD, LDN Pager: [phone] After Hours Pager: [phone]

## 2016-08-08 NOTE — Progress Notes (Signed)
Pt was discharged home with daughter.  Discharge instructions were given and understanding was stated.  Prescriptions were to be picked up at pharmacy.

## 2016-08-08 NOTE — Discharge Summary (Addendum)
Physician Discharge Summary  [name] [id number] DOB: July 02, 1955 DOA: 08/07/2016  PCP: [name], MD  Admit date: 08/07/2016 Discharge date: 08/08/2016  Admitted From:  HOME Disposition: Home  Recommendations for Outpatient Follow-up:  1. 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  The patient was in her usual state of health until about 2 days ago when she developed some right lower quadrant abdominal pain. The pain is constant, moderate to severe, and associated with nausea, increased loose output in her ostomy, mild fever, and possible red blood in her ostomy intermittently, but no vomiting, chills, rigidity, distention. Today the patient that she had a bowel obstruction again, so she came to the ER.  ED course: -Afebrile, heart rate 104, respirations pulse ox normal, blood pressure 169/97 -Na 137, K 3.6, Cr 0.8, WBC 11.1 K, Hgb 11.9 -Lipase normal -Total bilirubin 1.4 -Urinalysis without pyuria or hematuria -CT of the abdomen and pelvis with IV contrast showed right colon colitis with intramural abscess (nothing drainable) versus malignancy, without perforation, an old ventral hernia, colostomy, and a complex 2.3 cm left renal mass -She was given fluids, cipro and flagyl and TRH were asked to evaluate  1. Principle diagnosis is diverticulitis with no obstruction: CT abdomen discussed with Radiology, diverticulitis favored even in right colon. -Clinically improving, tolerated clears, advance to soft diet -Very gentle IVF -Strict I/Os -Morphine for pain -Cipro and Flagyl for full 14 days, close follow up with PCP   2. Rheumatoid arthritis: Orencia and  methotrexate given yesterday.   3. Hypertension: -Continue home meds  4. Normocytic anemia: Stable  5. Severe protein calorie malnutrition: See dietitian recommendations  6. Other medications: -Continue PPI -Continue folate -Continue allopurinol -DC Tizanidine while taking Ciprofloxacin because of drug interaction  7. Renal mass: This has been present in past, is followed by Dr. Diona Fanti from Urology. Imaging findings discussed with patient and daughter, who have follow up with Urology soon. -Per Urology  8. Elevated total bilirubin: Suspect from colitis, diverticulitis.   9. Chronic systolic and diastolic CHF: EF [id number]. Appears euvolemic now, not on furosemide at baseline. -STrict I/Os, daily weights   DVT prophylaxis:Lovenox Code Status:FULL Family Communication:Daughter at bedside Disposition Plan:DC home 2/14, I was told by CM and UR that patient doesn't meet criteria to stay in hospital.  Discharge Diagnoses:  Principal Problem:   Colitis Active Problems:   Blindness   Essential hypertension   Rheumatoid arthritis ([place])   Marfan's syndrome   Incisional hernia   Iron deficiency anemia   Chronic systolic CHF (congestive heart failure) [facility])  Discharge Instructions  Discharge Instructions    Increase activity slowly    Complete by:  As directed      Allergies as of 08/08/2016   No Known Allergies     Medication List    STOP taking these medications   HYDROcodone-acetaminophen 10-325 MG tablet Commonly known as:  NORCO   tiZANidine 4 MG tablet Commonly known as:  ZANAFLEX   traMADol 50 MG tablet Commonly known as:  ULTRAM     TAKE these medications   albuterol 108 (90 Base) MCG/ACT inhaler Commonly known as:  PROVENTIL HFA;VENTOLIN HFA Inhale 1 puff into the lungs  every 4 (four) hours as needed for wheezing or shortness of breath.   allopurinol 100 MG tablet Commonly known as:  ZYLOPRIM take 1 tablet by mouth once  daily   amLODipine 10 MG tablet Commonly known as:  NORVASC take 1 tablet by mouth once daily   aspirin EC 81 MG tablet Take 1 tablet (81 mg total) by mouth daily.   betamethasone valerate ointment 0.1 % Commonly known as:  VALISONE Apply a small amount topically 2 x a day for 1-2 weeks What changed:  how much to take  how to take this  when to take this  reasons to take this  additional instructions   CALCIUM + D PO Take 2.5 tablets by mouth daily.   cetirizine 10 MG tablet Commonly known as:  ZYRTEC Take 1 tablet (10 mg total) by mouth daily.   ciprofloxacin 500 MG tablet Commonly known as:  CIPRO Take 1 tablet (500 mg total) by mouth 2 (two) times daily.   diazepam 2 MG tablet Commonly known as:  VALIUM Take 1 tablet (2 mg total) by mouth every 12 (twelve) hours as needed (dizziness).   docusate sodium 100 MG capsule Commonly known as:  COLACE Take 100 mg by mouth 2 (two) times daily.   feeding supplement Liqd Take 1 Container by mouth 3 (three) times daily between meals.   folic acid 1 MG tablet Commonly known as:  FOLVITE Take 1 mg by mouth daily.   ketoconazole 2 % cream Commonly known as:  NIZORAL Apply 1 application topically 2 (two) times daily. What changed:  when to take this  reasons to take this   megestrol 625 MG/5ML suspension Commonly known as:  MEGACE ES take 5 milliliters by mouth twice a day   methotrexate (PF) 50 MG/2ML injection Inject 0.5 mLs into the muscle every Monday.   metroNIDAZOLE 500 MG tablet Commonly known as:  FLAGYL Take 1 tablet (500 mg total) by mouth every 8 (eight) hours.   multivitamin with minerals Tabs tablet Take 1 tablet by mouth daily.   NEXIUM 40 MG capsule Generic drug:  esomeprazole Take 1 capsule (40 mg total) by mouth 2 (two) times daily. What changed:  when to take this   ondansetron 4 MG tablet Commonly known as:  ZOFRAN Take 1 tablet (4 mg total) by mouth every 8 (eight) hours as needed  for nausea or vomiting.   ORENCIA 125 MG/ML Sosy Generic drug:  Abatacept Inject 1 application into the skin every Monday.   oxyCODONE-acetaminophen 5-325 MG tablet Commonly known as:  PERCOCET Take 1-2 tablets by mouth every 4 (four) hours as needed. What changed:  reasons to take this   polyethylene glycol packet Commonly known as:  MIRALAX / GLYCOLAX Take 17 g by mouth daily. What changed:  when to take this  reasons to take this   risedronate 150 MG tablet Commonly known as:  ACTONEL Take 1 tablet (150 mg total) by mouth every 30 (thirty) days. with water on empty stomach, nothing by mouth or lie down for next 30 minutes.   vitamin C 500 MG tablet Commonly known as:  ASCORBIC ACID Take 500 mg by mouth daily.      Follow-up Information    [name], MD. Schedule an appointment as soon as possible for a visit in 2 day(s).   Specialty:  Family Medicine Contact information: [place] [address] [phone]          No Known Allergies  Procedures/Studies: Ct Abdomen Pelvis W Contrast  Result Date: 08/07/2016 CLINICAL DATA:  61 year old hypertensive female with abdominal pain and diarrhea. History of small-bowel obstruction. Post colonic resection with colostomy. Initial encounter. EXAM: CT ABDOMEN AND PELVIS WITH CONTRAST TECHNIQUE: Multidetector CT imaging of the abdomen and pelvis was performed using the standard protocol following bolus administration of intravenous contrast. CONTRAST:  141mL ISOVUE-300 IOPAMIDOL (ISOVUE-300) INJECTION 61% COMPARISON:  Several prior exams, most recent 06/20/2015. FINDINGS: Lower chest: Sub pleural nodule right middle lobe stable since 2014. Hepatobiliary: Liver cysts without significant change. No calcified gallstones. Pancreas: No pancreatic mass noted. Mild prominence pancreatic duct noted on prior exams. Spleen: No splenic lesion or enlargement. Adrenals/Urinary Tract: Complex 2.3 cm mass arises from the  posterosuperior aspect of left kidney. This measured 9 mm in 2014 and therefore may represent malignancy (versus complex hemorrhagic cyst). This can be assessed with dedicated renal MR. Nonobstructing left upper pole renal calculus.  No adrenal mass. Stomach/Bowel: Markedly abnormal appearance of the right colon which is diffusely inflamed. Etiology indeterminate. This may represent a colitis/ diverticulitis with intramural abscess but without surrounding drainable abscess. Underline malignancy not excluded. Small amount of radiopaque material in this region but not having an appearance of sharp object. Appendix spared. Ventral wall hernia containing small and large bowel. The right colon abnormality is just proximal to this ventral hernia. Colostomy left lower quadrant. No free air. Vascular/Lymphatic: Carotid artery calcifications. Calcifications aorta a iliac arteries with ectasia. Reproductive: Question small fibroid. Other: Prominent urinary bladder with diverticulum to the right. No obvious bladder mass although contrast filled views not obtained. Musculoskeletal: Significant scoliosis convex left. Degenerative changes hip joints. Remote right ischium fracture. IMPRESSION: Markedly abnormal appearance of the right colon which is diffusely inflamed. Etiology indeterminate. This may represent a colitis/ diverticulitis with intramural abscess but without surrounding drainable abscess. Underline malignancy not excluded. Small radiopaque material in this region but not having an appearance of sharp object (as would be expected if this was cause of the bowel abnormality). Appendix spared. Ventral wall hernia containing small and large bowel. The right colon abnormality is just proximal to this ventral hernia. Colostomy left lower quadrant. Complex 2.3 cm mass arises from the posterosuperior aspect of left kidney. This measured 9 mm in 2014 and therefore may represent malignancy (versus complex hemorrhagic cyst).  This can be assessed with dedicated renal MR. Aortic atherosclerosis.  Carotid artery calcifications. Marked scoliosis and superimposed degenerative changes. These results were called by telephone at the time of interpretation on 08/07/2016 at 6:08 pm to [facility] PA , who verbally acknowledged these results. Electronically Signed   By: Genia Del M.D.   On: 08/07/2016 18:16     Vitals:   08/07/16 2129 08/08/16 0555  BP: 133/75 101/62  Pulse: (!) 101 77  Resp: 17 15  Temp: 99.5 F (37.5 C) 98.4 F (36.9 C)   Vitals:   08/07/16 2030 08/07/16 2108 08/07/16 2129 08/08/16 0555  BP: 143/89 144/86 133/75 101/62  Pulse:  (!) 43 (!) 101 77  Resp:  18 17 15   Temp:   99.5 F (37.5 C) 98.4 F (36.9 C)  TempSrc:   Oral Oral  SpO2:  (!) 86% 98% 100%  Weight:      Height:        The results of significant diagnostics from this hospitalization (including imaging, microbiology, ancillary and laboratory) are listed below for reference.     Microbiology: No results found for this or any previous visit (from the  past 240 hour(s)).   Labs: BNP (last 3 results) No results for input(s): BNP in the last 8760 hours. Basic Metabolic Panel:  Recent Labs Lab 08/07/16 1653 08/08/16 0515  NA 137 139  K 3.6 3.4*  CL 102 108  CO2 26 25  GLUCOSE 111* 105*  BUN 8 8  CREATININE 0.80 0.70  CALCIUM 9.3 8.4*   Liver Function Tests:  Recent Labs Lab 08/07/16 1653 08/07/16 2231 08/08/16 0515  AST 21  --  15  ALT 14  --  11*  ALKPHOS 86  --  72  BILITOT 1.4* 1.0 1.0  PROT 8.0  --  6.2*  ALBUMIN 4.3  --  3.3*    Recent Labs Lab 08/07/16 1653  LIPASE 22   No results for input(s): AMMONIA in the last 168 hours. CBC:  Recent Labs Lab 08/07/16 1653 08/08/16 0515  WBC 11.1* 9.6  NEUTROABS 10.0*  --   HGB 11.9* 10.0*  HCT 35.8* 30.6*  MCV 93.0 93.3  PLT 236 191   Cardiac Enzymes: No results for input(s): CKTOTAL, CKMB, CKMBINDEX, TROPONINI in the last 168  hours. BNP: Invalid input(s): POCBNP CBG: No results for input(s): GLUCAP in the last 168 hours. D-Dimer No results for input(s): DDIMER in the last 72 hours. Hgb A1c No results for input(s): HGBA1C in the last 72 hours. Lipid Profile No results for input(s): CHOL, HDL, LDLCALC, TRIG, CHOLHDL, LDLDIRECT in the last 72 hours. Thyroid function studies No results for input(s): TSH, T4TOTAL, T3FREE, THYROIDAB in the last 72 hours.  Invalid input(s): FREET3 Anemia work up No results for input(s): VITAMINB12, FOLATE, FERRITIN, TIBC, IRON, RETICCTPCT in the last 72 hours. Urinalysis    Component Value Date/Time   COLORURINE STRAW (A) 08/07/2016 1704   APPEARANCEUR CLEAR 08/07/2016 1704   LABSPEC 1.003 (L) 08/07/2016 1704   PHURINE 7.0 08/07/2016 1704   GLUCOSEU NEGATIVE 08/07/2016 1704   HGBUR NEGATIVE 08/07/2016 1704   BILIRUBINUR NEGATIVE 08/07/2016 1704   BILIRUBINUR neg 06/06/2015 1201   KETONESUR NEGATIVE 08/07/2016 1704   PROTEINUR NEGATIVE 08/07/2016 1704   UROBILINOGEN 0.2 06/06/2015 1201   UROBILINOGEN 0.2 11/22/2014 0929   NITRITE NEGATIVE 08/07/2016 1704   LEUKOCYTESUR TRACE (A) 08/07/2016 1704   Sepsis Labs Invalid input(s): PROCALCITONIN,  WBC,  LACTICIDVEN Microbiology No results found for this or any previous visit (from the past 240 hour(s)).  Time coordinating discharge: Over 30 minutes  SIGNED: Irwin Brakeman, MD  [facility] 08/08/2016, 3:07 PM Pager   If 7PM-7AM, please contact night-coverage [email or web address] Password [email or web address]

## 2016-08-08 NOTE — Progress Notes (Signed)
PROGRESS NOTE    [name]  [id number]  DOB: 04/26/1956  DOA: 08/07/2016 PCP: [name], MD Outpatient Specialists:   Hospital course: [name] is a 61 y.o. 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  Assessment & Plan:   1. Complicated diverticulitis:  CT abdomen discussed with Radiology, diverticulitis favored even in right colon. -Clinically improving, tolerated clears, advance to soft diet -Very gentle IVF -Strict I/Os -Morphine for pain -Cipro IV and Flagyl   2. Rheumatoid arthritis:  Orencia and methotrexate given yesterday. Currently on hold.   3. Hypertension:  -Continue amlodipine, with holding parameters  4. Normocytic anemia:  Stable  5. Severe protein calorie malnutrition:  -Hold Megace while on clears, consult dietitian  6. Other medications:  -Continue PPI -Continue folate -Continue allopurinol -Continue PRNs Norco and Tramadol and Zanaflex if needed  7. Renal mass: This has been present in past, is followed by Dr. Diona Fanti from Urology.  Imaging findings discussed with patient and daughter, who have follow up with Urology soon. -Per Urology  8. Elevated total bilirubin: Suspect from colitis, diverticulitis. -trend LFTs  9. Chronic systolic and diastolic CHF: EF A999333. Appears euvolemic now, not on furosemide at baseline. -STrict I/Os, daily weights   DVT prophylaxis: Lovenox  Code Status: FULL  Family Communication: Daughter at bedside  Disposition Plan: Anticipate IV antibiotics and fluids. Consults called:   Admission status: likely can discharge 2/15   Subjective: Pt says that her abdominal pain is improving  Objective: Vitals:   08/07/16 2030 08/07/16 2108  08/07/16 2129 08/08/16 0555  BP: 143/89 144/86 133/75 101/62  Pulse:  (!) 43 (!) 101 77  Resp:  18 17 15   Temp:   99.5 F (37.5 C) 98.4 F (36.9 C)  TempSrc:   Oral Oral  SpO2:  (!) 86% 98% 100%  Weight:      Height:        Intake/Output Summary (Last 24 hours) at 08/08/16 1050 Last data filed at 08/08/16 0600  Gross per 24 hour  Intake          1039.17 ml  Output              575 ml  Net           464.17 ml   Filed Weights   08/07/16 1630  Weight: 58.1 kg (128 lb)    Exam:  General exam: awake, alert, NAD. Cooperative. Blind.  Respiratory system:  No increased work of breathing. Cardiovascular system: S1 & S2 heard.  Gastrointestinal system: Abdomen is nondistended, soft and nontender. Normal bowel sounds heard. Central nervous system: Alert and oriented. No focal neurological deficits. Extremities: no Cyanosis.   Data Reviewed: Basic Metabolic Panel:  Recent Labs Lab 08/07/16 1653 08/08/16 0515  NA 137 139  K 3.6 3.4*  CL 102 108  CO2 26 25  GLUCOSE 111* 105*  BUN 8 8  CREATININE 0.80 0.70  CALCIUM 9.3 8.4*   Liver Function Tests:  Recent Labs Lab 08/07/16 1653 08/07/16 2231 08/08/16 0515  AST 21  --  15  ALT 14  --  11*  ALKPHOS 86  --  72  BILITOT 1.4* 1.0 1.0  PROT 8.0  --  6.2*  ALBUMIN 4.3  --  3.3*  Recent Labs Lab 08/07/16 1653  LIPASE 22   No results for input(s): AMMONIA in the last 168 hours. CBC:  Recent Labs Lab 08/07/16 1653 08/08/16 0515  WBC 11.1* 9.6  NEUTROABS 10.0*  --   HGB 11.9* 10.0*  HCT 35.8* 30.6*  MCV 93.0 93.3  PLT 236 191   Cardiac Enzymes: No results for input(s): CKTOTAL, CKMB, CKMBINDEX, TROPONINI in the last 168 hours. CBG (last 3)  No results for input(s): GLUCAP in the last 72 hours. No results found for this or any previous visit (from the past 240 hour(s)).   Studies: Ct Abdomen Pelvis W Contrast  Result Date: 08/07/2016 CLINICAL DATA:  61 year old hypertensive female with abdominal  pain and diarrhea. History of small-bowel obstruction. Post colonic resection with colostomy. Initial encounter. EXAM: CT ABDOMEN AND PELVIS WITH CONTRAST TECHNIQUE: Multidetector CT imaging of the abdomen and pelvis was performed using the standard protocol following bolus administration of intravenous contrast. CONTRAST:  147mL ISOVUE-300 IOPAMIDOL (ISOVUE-300) INJECTION 61% COMPARISON:  Several prior exams, most recent 06/20/2015. FINDINGS: Lower chest: Sub pleural nodule right middle lobe stable since 2014. Hepatobiliary: Liver cysts without significant change. No calcified gallstones. Pancreas: No pancreatic mass noted. Mild prominence pancreatic duct noted on prior exams. Spleen: No splenic lesion or enlargement. Adrenals/Urinary Tract: Complex 2.3 cm mass arises from the posterosuperior aspect of left kidney. This measured 9 mm in 2014 and therefore may represent malignancy (versus complex hemorrhagic cyst). This can be assessed with dedicated renal MR. Nonobstructing left upper pole renal calculus.  No adrenal mass. Stomach/Bowel: Markedly abnormal appearance of the right colon which is diffusely inflamed. Etiology indeterminate. This may represent a colitis/ diverticulitis with intramural abscess but without surrounding drainable abscess. Underline malignancy not excluded. Small amount of radiopaque material in this region but not having an appearance of sharp object. Appendix spared. Ventral wall hernia containing small and large bowel. The right colon abnormality is just proximal to this ventral hernia. Colostomy left lower quadrant. No free air. Vascular/Lymphatic: Carotid artery calcifications. Calcifications aorta a iliac arteries with ectasia. Reproductive: Question small fibroid. Other: Prominent urinary bladder with diverticulum to the right. No obvious bladder mass although contrast filled views not obtained. Musculoskeletal: Significant scoliosis convex left. Degenerative changes hip joints.  Remote right ischium fracture. IMPRESSION: Markedly abnormal appearance of the right colon which is diffusely inflamed. Etiology indeterminate. This may represent a colitis/ diverticulitis with intramural abscess but without surrounding drainable abscess. Underline malignancy not excluded. Small radiopaque material in this region but not having an appearance of sharp object (as would be expected if this was cause of the bowel abnormality). Appendix spared. Ventral wall hernia containing small and large bowel. The right colon abnormality is just proximal to this ventral hernia. Colostomy left lower quadrant. Complex 2.3 cm mass arises from the posterosuperior aspect of left kidney. This measured 9 mm in 2014 and therefore may represent malignancy (versus complex hemorrhagic cyst). This can be assessed with dedicated renal MR. Aortic atherosclerosis.  Carotid artery calcifications. Marked scoliosis and superimposed degenerative changes. These results were called by telephone at the time of interpretation on 08/07/2016 at 6:08 pm to [facility] PA , who verbally acknowledged these results. Electronically Signed   By: Genia Del M.D.   On: 08/07/2016 18:16   Scheduled Meds: . allopurinol  100 mg Oral Daily  . amLODipine  10 mg Oral Daily  . aspirin EC  81 mg Oral Daily  . ciprofloxacin  400 mg Intravenous Q12H  . enoxaparin (LOVENOX)  injection  40 mg Subcutaneous Q24H  . folic acid  1 mg Oral Daily  . [START ON 08/13/2016] methotrexate (PF)  12.5 mg Intramuscular Q Mon  . metroNIDAZOLE  500 mg Oral Q8H  . pantoprazole  80 mg Oral Q1200   Continuous Infusions: . sodium chloride 50 mL/hr at 08/07/16 2313    Principal Problem:   Colitis Active Problems:   Blindness   Essential hypertension   Rheumatoid arthritis ([facility])   Marfan's syndrome   Incisional hernia   Iron deficiency anemia   Chronic systolic CHF (congestive heart failure) ([place])  Time spent:   Irwin Brakeman, MD, FAAFP [facility] Pager [phone]  If 7PM-7AM, please contact night-coverage [email or web address] Password [email or web address] 08/08/2016, 10:50 AM    LOS: 0 days

## 2016-08-08 NOTE — Care Management Obs Status (Signed)
[place] NOTIFICATION   Patient Details  Name: [name] MRN: [id number] Date of Birth: 05/25/1956   Medicare Observation Status Notification Given:  Yes  Patient legally blind, document read to patient, questions answered, patient expressed understanding, declined calling family to share with them. Copy to patient placed with belongings.   Guadalupe Maple, RN 08/08/2016, 2:34 PM

## 2016-08-09 ENCOUNTER — Telehealth: Payer: Self-pay | Admitting: Family Medicine

## 2016-08-09 NOTE — Telephone Encounter (Signed)
Spoke with pt and reviewed medications and directions. She voiced understanding to all. Nothing further needed at this time.

## 2016-08-09 NOTE — Telephone Encounter (Signed)
[facility] - Client [place] [facility] Patient Name: [name] DOB: Jul 29, 1955 Initial Comment Caller states has questions about taking too much medication. Nurse Assessment Nurse: Andria Frames, RN, Aeriel Date/Time Eilene Ghazi Time): 08/09/2016 12:34:02 PM Confirm and document reason for call. If symptomatic, describe symptoms. ---Caller states, yesterday she was prescribed two antibiotics yesterday. She is totally blind she doesn't know if she is supposed to take both of them. When she left the hospital she is not sure if she was supposed to be taking them. She was in the hospital for diverticulitis. They were going to prescibe her a medication that she would have to be off of her pain medication for. Then they decided to call and change it now she is afraid she is accidentally taking both of them and she doesn't know because she can't see. Does the patient have any new or worsening symptoms? ---No Please document clinical information provided and list any resource used. ---Caller is just questioning if she is supposed to take both medications. Nurse contacted the backline to see if pt could talk to a nurse and if they had that information. Nurse to inform caller that someone will call her back about the medications. Caller is not symptomatic at this time. Nurse also informed claler if she becomes symptomatic to call us back immediately. Guidelines Guideline Title Affirmed Question Affirmed Notes Final Disposition User

## 2016-08-13 ENCOUNTER — Ambulatory Visit (INDEPENDENT_AMBULATORY_CARE_PROVIDER_SITE_OTHER): Payer: Medicare Other | Admitting: Family Medicine

## 2016-08-13 ENCOUNTER — Encounter: Payer: Self-pay | Admitting: Family Medicine

## 2016-08-13 VITALS — BP 118/68 | Temp 98.5°F

## 2016-08-13 DIAGNOSIS — K219 Gastro-esophageal reflux disease without esophagitis: Secondary | ICD-10-CM

## 2016-08-13 DIAGNOSIS — I10 Essential (primary) hypertension: Secondary | ICD-10-CM | POA: Diagnosis not present

## 2016-08-13 DIAGNOSIS — K5732 Diverticulitis of large intestine without perforation or abscess without bleeding: Secondary | ICD-10-CM

## 2016-08-13 DIAGNOSIS — N2889 Other specified disorders of kidney and ureter: Secondary | ICD-10-CM

## 2016-08-13 DIAGNOSIS — I5022 Chronic systolic (congestive) heart failure: Secondary | ICD-10-CM

## 2016-08-13 DIAGNOSIS — K432 Incisional hernia without obstruction or gangrene: Secondary | ICD-10-CM | POA: Diagnosis not present

## 2016-08-13 MED ORDER — HYDROCODONE-ACETAMINOPHEN 10-325 MG PO TABS: 1.0000 | ORAL_TABLET | Freq: Four times a day (QID) | ORAL | 0 refills | Status: DC | PRN

## 2016-08-13 NOTE — Progress Notes (Signed)
   Subjective:    Patient ID: [name], female    DOB: 08-15-1955, 60 y.o.   MRN: [id number]  HPI Here with her daughter to follow up a hospital stay from 08-07-16 to 08-08-16 for RLQ pain and fever that was diagnosed as diverticulitis. This was seen on a CT scan, as well as a complex 2.3 cm mass on the left kidney. She was treated with IV fluids and was started on oral Cipro and Flagyl. Since going home her bowels have been moving and the pain has resolved. No fever. She does complain of some nausea but I think this is coming from the antibiotics. She had been scheduled for a colonoscopy last November with Dr. Carlean Purl but this was postponed to make sure her cardiac status was okay, she did have an ECHO showing some global hypokinesis and an EF of 40-45%. She has been seeing Dr. Diona Fanti once yearly to follow the left renal mass, and he thinks it is likely to be a renal cell carcinoma. However he says no intervention would be needed until this gets to be 3.0 cm in size.    Review of Systems  Constitutional: Negative.   Respiratory: Negative.   Cardiovascular: Negative.   Gastrointestinal: Positive for nausea. Negative for abdominal distention, abdominal pain, anal bleeding, blood in stool, constipation, diarrhea, rectal pain and vomiting.  Genitourinary: Negative.   Neurological: Negative.        Objective:   Physical Exam  Constitutional: She appears well-developed and well-nourished.  Cardiovascular: Normal rate, regular rhythm, normal heart sounds and intact distal pulses.   Pulmonary/Chest: Effort normal and breath sounds normal.  Abdominal: Soft. Bowel sounds are normal. She exhibits no distension. There is no tenderness. There is no rebound and no guarding.  Her ostomy bag is intact. She has a large ventral hernia which is easily reducible and not tender           Assessment & Plan:  She is recovering from a case of diverticultis. She will finsh out the 14 days courses of  Flagyl and Cipro. We will contact the GI office so they can reschedule her colonoscopy. She is set to see Dr. Diona Fanti in a few weeks to review the renal mass.  [name], MD

## 2016-08-13 NOTE — Progress Notes (Signed)
Pre visit review using our clinic review tool, if applicable. No additional management support is needed unless otherwise documented below in the visit note. 

## 2016-08-14 ENCOUNTER — Telehealth: Payer: Self-pay | Admitting: Internal Medicine

## 2016-08-14 ENCOUNTER — Encounter (HOSPITAL_COMMUNITY): Payer: Self-pay | Admitting: Emergency Medicine

## 2016-08-14 ENCOUNTER — Emergency Department (HOSPITAL_COMMUNITY): Payer: Medicare Other

## 2016-08-14 ENCOUNTER — Emergency Department (HOSPITAL_COMMUNITY)
Admission: EM | Admit: 2016-08-14 | Discharge: 2016-08-14 | Disposition: A | Discharge status: Home / Self Care | Payer: Medicare Other | Attending: Emergency Medicine | Admitting: Emergency Medicine

## 2016-08-14 DIAGNOSIS — Z8673 Personal history of transient ischemic attack (TIA), and cerebral infarction without residual deficits: Secondary | ICD-10-CM | POA: Diagnosis not present

## 2016-08-14 DIAGNOSIS — Z79899 Other long term (current) drug therapy: Secondary | ICD-10-CM | POA: Diagnosis not present

## 2016-08-14 DIAGNOSIS — Z87891 Personal history of nicotine dependence: Secondary | ICD-10-CM | POA: Insufficient documentation

## 2016-08-14 DIAGNOSIS — Z96653 Presence of artificial knee joint, bilateral: Secondary | ICD-10-CM | POA: Insufficient documentation

## 2016-08-14 DIAGNOSIS — N390 Urinary tract infection, site not specified: Secondary | ICD-10-CM | POA: Diagnosis not present

## 2016-08-14 DIAGNOSIS — R1110 Vomiting, unspecified: Secondary | ICD-10-CM | POA: Diagnosis not present

## 2016-08-14 DIAGNOSIS — I110 Hypertensive heart disease with heart failure: Secondary | ICD-10-CM | POA: Insufficient documentation

## 2016-08-14 DIAGNOSIS — Z7982 Long term (current) use of aspirin: Secondary | ICD-10-CM | POA: Diagnosis not present

## 2016-08-14 DIAGNOSIS — I5022 Chronic systolic (congestive) heart failure: Secondary | ICD-10-CM | POA: Diagnosis not present

## 2016-08-14 DIAGNOSIS — R109 Unspecified abdominal pain: Secondary | ICD-10-CM | POA: Diagnosis not present

## 2016-08-14 DIAGNOSIS — R079 Chest pain, unspecified: Secondary | ICD-10-CM | POA: Diagnosis not present

## 2016-08-14 LAB — CBC WITH DIFFERENTIAL/PLATELET
Basophils Absolute: 0 10*3/uL (ref 0.0–0.1)
Basophils Relative: 0 %
Eosinophils Absolute: 0 10*3/uL (ref 0.0–0.7)
Eosinophils Relative: 1 %
HCT: 34.7 % — ABNORMAL LOW (ref 36.0–46.0)
Hemoglobin: 11.4 g/dL — ABNORMAL LOW (ref 12.0–15.0)
Lymphocytes Relative: 19 %
Lymphs Abs: 1.2 10*3/uL (ref 0.7–4.0)
MCH: 30.8 pg (ref 26.0–34.0)
MCHC: 32.9 g/dL (ref 30.0–36.0)
MCV: 93.8 fL (ref 78.0–100.0)
Monocytes Absolute: 0.3 10*3/uL (ref 0.1–1.0)
Monocytes Relative: 4 %
Neutro Abs: 4.6 10*3/uL (ref 1.7–7.7)
Neutrophils Relative %: 76 %
Platelets: 297 10*3/uL (ref 150–400)
RBC: 3.7 MIL/uL — ABNORMAL LOW (ref 3.87–5.11)
RDW: 14.8 % (ref 11.5–15.5)
WBC: 6.1 10*3/uL (ref 4.0–10.5)

## 2016-08-14 LAB — COMPREHENSIVE METABOLIC PANEL
ALT: 11 U/L — ABNORMAL LOW (ref 14–54)
AST: 23 U/L (ref 15–41)
Albumin: 3.8 g/dL (ref 3.5–5.0)
Alkaline Phosphatase: 67 U/L (ref 38–126)
Anion gap: 10 (ref 5–15)
BUN: <5 mg/dL — ABNORMAL LOW (ref 6–20)
CO2: 25 mmol/L (ref 22–32)
Calcium: 9.1 mg/dL (ref 8.9–10.3)
Chloride: 105 mmol/L (ref 101–111)
Creatinine, Ser: 0.78 mg/dL (ref 0.44–1.00)
GFR calc Af Amer: >60 mL/min (ref >60)
GFR calc non Af Amer: >60 mL/min (ref >60)
Glucose, Bld: 100 mg/dL — ABNORMAL HIGH (ref 65–99)
Potassium: 3.7 mmol/L (ref 3.5–5.1)
Sodium: 140 mmol/L (ref 135–145)
Total Bilirubin: 0.8 mg/dL (ref 0.3–1.2)
Total Protein: 6.6 g/dL (ref 6.5–8.1)

## 2016-08-14 LAB — I-STAT TROPONIN, ED
Troponin i, poc: 0 ng/mL (ref 0.00–0.08)
Troponin i, poc: 0 ng/mL (ref 0.00–0.08)

## 2016-08-14 LAB — URINALYSIS, ROUTINE W REFLEX MICROSCOPIC
Bilirubin Urine: NEGATIVE
Glucose, UA: NEGATIVE mg/dL
Hgb urine dipstick: NEGATIVE
Ketones, ur: 5 mg/dL — AB
Nitrite: NEGATIVE
Protein, ur: NEGATIVE mg/dL
Specific Gravity, Urine: 1.004 — ABNORMAL LOW (ref 1.005–1.030)
pH: 6 (ref 5.0–8.0)

## 2016-08-14 LAB — LIPASE, BLOOD: Lipase: 24 U/L (ref 11–51)

## 2016-08-14 MED ORDER — IOPAMIDOL (ISOVUE-300) INJECTION 61%
INTRAVENOUS | Status: AC
  Administered 2016-08-14: 100 mL
  Filled 2016-08-14: qty 100

## 2016-08-14 MED ORDER — CEFPODOXIME PROXETIL 100 MG PO TABS: 100.0000 mg | ORAL_TABLET | Freq: Two times a day (BID) | ORAL | 0 refills | Status: AC

## 2016-08-14 MED ORDER — ACETAMINOPHEN 500 MG PO TABS
1000.0000 mg | ORAL_TABLET | Freq: Once | ORAL | Status: AC
  Administered 2016-08-14: 1000 mg via ORAL
  Filled 2016-08-14: qty 2

## 2016-08-14 MED ORDER — METOCLOPRAMIDE HCL 5 MG/ML IJ SOLN
10.0000 mg | Freq: Once | INTRAMUSCULAR | Status: AC
  Administered 2016-08-14: 10 mg via INTRAVENOUS
  Filled 2016-08-14: qty 2

## 2016-08-14 MED ORDER — ONDANSETRON HCL 4 MG/2ML IJ SOLN
4.0000 mg | Freq: Once | INTRAMUSCULAR | Status: AC
  Administered 2016-08-14: 4 mg via INTRAVENOUS
  Filled 2016-08-14: qty 2

## 2016-08-14 MED ORDER — AMOXICILLIN-POT CLAVULANATE ER 1000-62.5 MG PO TB12: 2.0000 | ORAL_TABLET | Freq: Two times a day (BID) | ORAL | 0 refills | Status: DC

## 2016-08-14 MED ORDER — ASPIRIN 81 MG PO CHEW: 81.0000 mg | CHEWABLE_TABLET | Freq: Once | ORAL | Status: DC

## 2016-08-14 MED ORDER — METOCLOPRAMIDE HCL 10 MG PO TABS: 10.0000 mg | ORAL_TABLET | Freq: Four times a day (QID) | ORAL | 0 refills | Status: DC

## 2016-08-14 MED ORDER — CIPROFLOXACIN HCL 500 MG PO TABS
500.0000 mg | ORAL_TABLET | Freq: Once | ORAL | Status: AC
  Administered 2016-08-14: 500 mg via ORAL
  Filled 2016-08-14: qty 1

## 2016-08-14 MED ORDER — METRONIDAZOLE 500 MG PO TABS
500.0000 mg | ORAL_TABLET | Freq: Three times a day (TID) | ORAL | Status: DC
  Administered 2016-08-14: 500 mg via ORAL
  Filled 2016-08-14: qty 1

## 2016-08-14 MED ORDER — SODIUM CHLORIDE 0.9 % IV BOLUS (SEPSIS): 1000.0000 mL | Freq: Once | INTRAVENOUS | Status: DC

## 2016-08-14 MED ORDER — MOXIFLOXACIN HCL 400 MG PO TABS: 400.0000 mg | ORAL_TABLET | Freq: Every day | ORAL | 0 refills | Status: DC

## 2016-08-14 NOTE — ED Provider Notes (Signed)
[facility] DEPT Provider Note   CSN: [id number] Arrival date & time: 08/14/16  1147     History   Chief Complaint Chief Complaint  Patient presents with  . Heartburn    HPI [name] is a 61 y.o. female.  HPI Arrives due to c/o abdominal pain and pain with swallowing abx. Recently seen here and dx with diverticulitis vs intramural abscess, rx medically and sent home after brief admission. However, states that sx have worsened and every time she swallows the pain she has abdominal pain and chest pain that is burning with nausea. Some vomiting but minimal, some lose stool. Has had sbo and has colostomy - has had good output however. No blood in vomit or stool. Pt is blind however, has severe limitations due to marfan's and RA. No fevers, chills, cough, sob. Not exertional. Does not radiate.  Past Medical History:  Diagnosis Date  . Atrial flutter ([place])   . Chronic gout 2008  . Diverticulitis of large intestine with perforation   . Fibroid   . Hearing aid worn    pt wears bilateral hearing aids  . Hernia 4/08  . Hypertension   . Legally blind    since pt was a teenager  . Marfan's syndrome affecting skin    with scolosis  . Osteoporosis   . Rheumatoid arthritis [facility])    sees Dr. Gavin Pound   . SBO (small bowel obstruction) 01/05/2013  . Small bowel obstruction due to adhesions   . Status post bunionectomy 1/10   bilateral   . Stroke ([place]) 2/08  . 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 Colitis 08/07/2016  . Chronic systolic CHF (congestive heart failure) ([place])   . Hyperglycemia   . Ventral hernia without obstruction or gangrene 06/06/2015  . Renal mass   . SBO (small bowel obstruction) ([place])   . Small bowel obstruction 05/01/2014  . Pulmonary HTN ([place]) 01/01/2014  . Tachycardia 01/01/2014  . Acute renal failure ([place]) 01/01/2014  .  Iron deficiency anemia 01/01/2014  . Atrial flutter ([place]) 12/31/2013  . Encounter for ostomy care education 11/30/2013  . Stoma dermatitis 11/30/2013  . Incisional hernia 11/30/2013  . Constipation, chronic 11/30/2013  . Charcot's joint of foot 07/07/2013  . Bilateral leg edema 04/20/2013  . 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 Rheumatoid arthritis ([place]) 06/07/2009  . THYROID NODULE 07/01/2007  . LUNG NODULE 07/01/2007  . Osteoporosis 05/07/2007  . 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 Blindness 04/12/2007  . HEMORRHOIDS, INTERNAL 04/12/2007  . 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 HYPERLIPIDEMIA 01/07/2007  . Essential hypertension 01/07/2007  . GERD 01/07/2007  . Marfan's syndrome 01/07/2007    Past Surgical History:  Procedure Laterality Date  . ABLATION  01/04/14   atrial flutter ablation (2 circuits) by Dr Lovena Le  . ATRIAL FLUTTER ABLATION N/A 01/04/2014   Procedure: ATRIAL FLUTTER ABLATION;  Surgeon: Evans Lance, MD;  Location: [facility] CATH LAB;  Service: Cardiovascular;  Laterality: N/A;  . BUNIONECTOMY Bilateral 1/10  . COLON SURGERY     colectomy  . COLOSTOMY  11/27/2012  . HERNIA REPAIR    . LAPAROSCOPIC ABDOMINAL EXPLORATION N/A 01/06/2013   Procedure: LAPAROSCOPIC ABDOMINAL EXPLORATION;  Surgeon: Adin Hector, MD;  Location: [facility] ORS;  Service: General;  Laterality: N/A;  . LAPAROSCOPIC  LYSIS OF ADHESIONS N/A 01/06/2013   Procedure: LAPAROSCOPIC LYSIS OF ADHESIONS/ INTEROTOMY REPAIR;  Surgeon: Adin Hector, MD;  Location: [facility] ORS;  Service: General;  Laterality: N/A;  . LAPAROTOMY N/A 11/27/2012   Procedure: EXPLORATORY LAPAROTOMY SIGMOID COLECTOMY, COLOSTOMY;  Surgeon: Madilyn Hook, DO;  Location: [facility] ORS;  Service: General;  Laterality: N/A;  . TOTAL KNEE ARTHROPLASTY Bilateral 6/08    OB History    Gravida Para Term Preterm AB Living   1 1 1     1    SAB TAB Ectopic Multiple Live Births           1       Home  Medications    Prior to Admission medications   Medication Sig Start Date End Date Taking? Authorizing Provider  Abatacept (ORENCIA) 125 MG/ML SOSY Inject 1 application into the skin every Monday.     Historical Provider, MD  albuterol (PROVENTIL HFA;VENTOLIN HFA) 108 (90 BASE) MCG/ACT inhaler Inhale 1 puff into the lungs every 4 (four) hours as needed for wheezing or shortness of breath. 12/29/13   [name], MD  allopurinol (ZYLOPRIM) 100 MG tablet take 1 tablet by mouth once daily 07/10/16   [name], MD  amLODipine (NORVASC) 10 MG tablet take 1 tablet by mouth once daily 07/10/16   [name], MD  aspirin EC 81 MG tablet Take 1 tablet (81 mg total) by mouth daily. 05/11/14   Verlee Monte, MD  betamethasone valerate ointment (VALISONE) 0.1 % Apply a small amount topically 2 x a day for 1-2 weeks Patient taking differently: Apply 1 application topically 2 (two) times daily as needed (inflammation). Apply a small amount topically 2 x a day for 1-2 weeks 02/20/16   Salvadore Dom, MD  Calcium Carbonate-Vitamin D (CALCIUM + D PO) Take 2.5 tablets by mouth daily.     Historical Provider, MD  cefpodoxime (VANTIN) 100 MG tablet Take 1 tablet (100 mg total) by mouth 2 (two) times daily. 08/14/16 08/17/16  Karma Greaser, MD  cetirizine (ZYRTEC) 10 MG tablet Take 1 tablet (10 mg total) by mouth daily. 11/17/14   [name], MD  ciprofloxacin (CIPRO) 500 MG tablet Take 1 tablet (500 mg total) by mouth 2 (two) times daily. 08/08/16 08/21/16  Clanford Marisa Hua, MD  diazepam (VALIUM) 2 MG tablet Take 1 tablet (2 mg total) by mouth every 12 (twelve) hours as needed (dizziness). 12/26/15   [name], MD  docusate sodium (COLACE) 100 MG capsule Take 100 mg by mouth 2 (two) times daily.    Historical Provider, MD  feeding supplement (BOOST / RESOURCE BREEZE) LIQD Take 1 Container by mouth 3 (three) times daily between meals. 08/08/16 09/07/16  Clanford Marisa Hua, MD  folic acid (FOLVITE) 1 MG  tablet Take 1 mg by mouth daily.    Historical Provider, MD  HYDROcodone-acetaminophen (NORCO) 10-325 MG tablet Take 1 tablet by mouth every 6 (six) hours as needed for moderate pain. 08/13/16   [name], MD  ketoconazole (NIZORAL) 2 % cream Apply 1 application topically 2 (two) times daily. Patient taking differently: Apply 1 application topically 2 (two) times daily as needed for irritation.  08/10/13   [name], MD  megestrol (MEGACE ES) [phone] MG/5ML suspension take 5 milliliters by mouth twice a day 07/10/16   [name], MD  Methotrexate Sodium (METHOTREXATE, PF,) 50 MG/2ML injection Inject 0.5 mLs into the muscle every Monday. 05/14/16   Historical Provider,  MD  metoCLOPramide (REGLAN) 10 MG tablet Take 1 tablet (10 mg total) by mouth every 6 (six) hours. 08/14/16   Karma Greaser, MD  metoCLOPramide (REGLAN) 10 MG tablet Take 1 tablet (10 mg total) by mouth every 6 (six) hours. 08/14/16   Karma Greaser, MD  metroNIDAZOLE (FLAGYL) 500 MG tablet Take 1 tablet (500 mg total) by mouth every 8 (eight) hours. 08/08/16 08/21/16  Clanford Marisa Hua, MD  Multiple Vitamin (MULTIVITAMIN WITH MINERALS) TABS tablet Take 1 tablet by mouth daily.    Historical Provider, MD  NEXIUM 40 MG capsule Take 1 capsule (40 mg total) by mouth 2 (two) times daily. Patient taking differently: Take 40 mg by mouth daily.  09/05/15   [name], MD  ondansetron (ZOFRAN) 4 MG tablet Take 1 tablet (4 mg total) by mouth every 8 (eight) hours as needed for nausea or vomiting. Patient not taking: Reported on 08/13/2016 06/20/15   Domenic Moras, PA-C  polyethylene glycol (MIRALAX / GLYCOLAX) packet Take 17 g by mouth daily. Patient taking differently: Take 17 g by mouth daily as needed for moderate constipation.  11/11/15   [name], MD  risedronate (ACTONEL) 150 MG tablet Take 1 tablet (150 mg total) by mouth every 30 (thirty) days. with water on empty stomach, nothing by mouth or lie down for next 30 minutes.  08/05/15   [name], MD  vitamin C (ASCORBIC ACID) 500 MG tablet Take 500 mg by mouth daily.     Historical Provider, MD    Family History Family History  Problem Relation Age of Onset  . Heart attack Neg Hx     Social History Social History  Substance Use Topics  . Smoking status: Former Smoker    Packs/day: 1.00    Types: Cigarettes    Quit date: 11/27/2012  . Smokeless tobacco: Never Used  . Alcohol use No     Comment: recovering alcoholic sober since 0000000     Allergies   Patient has no known allergies.   Review of Systems Review of Systems  Constitutional: Negative for fever.  Allergic/Immunologic: Negative for immunocompromised state.  All other systems reviewed and are negative.    Physical Exam Updated Vital Signs BP 151/89   Pulse 81   Temp 98.1 F (36.7 C)   Resp 16   LMP 06/25/2006 (LMP Unknown)   SpO2 99%   Physical Exam  Constitutional: She is oriented to person, place, and time. No distress.  Cachectic, chronically ill appearing  HENT:  Head: Normocephalic and atraumatic.  Eyes: Right eye exhibits no discharge. Left eye exhibits no discharge.  Right cornea with what superficially appears like large cataract  Neck: Normal range of motion. Neck supple.  Cardiovascular: Regular rhythm.   Slight tachycardia  Pulmonary/Chest: Effort normal and breath sounds normal. No respiratory distress.  Abdominal: Soft. Bowel sounds are normal. She exhibits no distension and no mass. There is tenderness. There is no rebound and no guarding.  Neg cvat Neg murphys sign Colostomy with brown stool Diffuse tenderness  Musculoskeletal: She exhibits no edema.  Neurological: She is alert and oriented to person, place, and time.  Skin: Skin is warm. No rash noted.  Psychiatric: She has a normal mood and affect.  Nursing note and vitals reviewed.    ED Treatments / Results  Labs (all labs ordered are listed, but only abnormal results are displayed) Labs  Reviewed  CBC WITH DIFFERENTIAL/PLATELET - Abnormal; Notable for the following:  Result Value   RBC 3.70 (*)    Hemoglobin 11.4 (*)    HCT 34.7 (*)    All other components within normal limits  COMPREHENSIVE METABOLIC PANEL - Abnormal; Notable for the following:    Glucose, Bld 100 (*)    BUN <5 (*)    ALT 11 (*)    All other components within normal limits  URINALYSIS, ROUTINE W REFLEX MICROSCOPIC - Abnormal; Notable for the following:    Specific Gravity, Urine 1.004 (*)    Ketones, ur 5 (*)    Leukocytes, UA SMALL (*)    Bacteria, UA RARE (*)    Squamous Epithelial / LPF 0-5 (*)    All other components within normal limits  URINE CULTURE  LIPASE, BLOOD  I-STAT TROPOININ, ED  I-STAT TROPOININ, ED    EKG  EKG Interpretation None       Radiology Dg Chest 2 View  Result Date: 08/14/2016 CLINICAL DATA:  Chest pain. History of Marfan's syndrome, previous CVA, atrial flutter, former smoker. EXAM: CHEST  2 VIEW COMPARISON:  Chest x-ray of June 20, 2015 FINDINGS: The lungs are hyperinflated and clear. The heart is top-normal in size. The pulmonary vascularity is normal. There is calcification in the wall of the aortic arch. There is no pleural effusion. There is severe levocurvature centered at the thoracolumbar junction. There is moderate degenerative change of the left shoulder with severe degenerative change of the right shoulder. IMPRESSION: No acute cardiopulmonary abnormality. Top-normal cardiac size without pulmonary edema. Thoracic aortic atherosclerosis. Electronically Signed   By: David  Martinique M.D.   On: 08/14/2016 12:46   Ct Abdomen Pelvis W Contrast  Result Date: 08/14/2016 CLINICAL DATA:  Abdominal pain left lower quadrant.  Nausea. EXAM: CT ABDOMEN AND PELVIS WITH CONTRAST TECHNIQUE: Multidetector CT imaging of the abdomen and pelvis was performed using the standard protocol following bolus administration of intravenous contrast. CONTRAST:  147mL ISOVUE-300  IOPAMIDOL (ISOVUE-300) INJECTION 61% COMPARISON:  CT abdomen pelvis 09/04/2016, MRI abdomen 07/12/2014 FINDINGS: Lower chest: Lung bases clear Hepatobiliary: Benign cysts in the liver unchanged. Normal gallbladder and bile ducts. Pancreas: Prominent distal pancreatic duct measuring approximately 3.2 mm in diameter. This is unchanged. No mass or edema in the pancreas. No pancreatic calcifications. Pancreatic duct is also prominent on the CT of 06/06/2015. Spleen: Negative Adrenals/Urinary Tract: Negative for renal obstruction or stone. 19 mm mass left posterior kidney unchanged from prior studies. This was shown to be a solid enhancing lesion on the prior MRI when it measured 16 x 13 mm. This may represent a small renal cell carcinoma. Urinary bladder normal. Stomach/Bowel: Stomach normal. Negative for bowel obstruction. Normal appendix. Negative for diverticulitis. Vascular/Lymphatic: Extensive atherosclerotic disease in the aorta without aneurysm. No lymphadenopathy. Reproductive: 15 mm enhancing nodule posterior uterus unchanged from prior studies. Probable uterine fibroid. No adnexal mass. Other: Midline ventral hernia is relatively large and contains bowel loops as noted previously. No bowel edema. Ostomy in the left abdomen unchanged. Musculoskeletal: Marked levoscoliosis at L1-2. Extensive degenerative change throughout the thoracic and lumbar spine. Mild fracture of T10 vertebral body unchanged from prior studies. Advanced degenerative change in both hip joints. No acute fracture. Chronic fracture right ischioanal tuberosity. IMPRESSION: No cause for acute abdominal pain identified Prominent distal pancreatic duct is chronic and unchanged. 19 mm enhancing solid mass left kidney unchanged from prior studies. Based on prior MRI in 2016, this was a solid enhancing lesion and may represent a benign or malignant renal tumor. Uterine fibroid Large  ventral hernia containing bowel loops. No bowel obstruction or bowel  edema Marked levoscoliosis. Extensive degenerative change throughout the spine and both hip joints. Electronically Signed   By: Franchot Gallo M.D.   On: 08/14/2016 14:17    Procedures Procedures (including critical care time)  Medications Ordered in ED Medications  metroNIDAZOLE (FLAGYL) tablet 500 mg (500 mg Oral Given 08/14/16 1404)  ondansetron (ZOFRAN) injection 4 mg (4 mg Intravenous Given 08/14/16 1228)  iopamidol (ISOVUE-300) 61 % injection (100 mLs  Contrast Given 08/14/16 1349)  ciprofloxacin (CIPRO) tablet 500 mg (500 mg Oral Given 08/14/16 1403)  metoCLOPramide (REGLAN) injection 10 mg (10 mg Intravenous Given 08/14/16 1447)  acetaminophen (TYLENOL) tablet 1,000 mg (1,000 mg Oral Given 08/14/16 1513)     Initial Impression / Assessment and Plan / ED Course  I have reviewed the triage vital signs and the nursing notes.  Pertinent labs & imaging results that were available during my care of the patient were reviewed by me and considered in my medical decision making (see chart for details).    Ct ab/p improving, resolved diverticulitis/abscess - does have mass that pt already aware of and will need fu for Labs reassuring, doubt acute abdomen UA however with WBC, no dysuria however Cipro/flagyl causing likely esophagitis Delta trop negative, doubt dissection as atypical Discussed with pharmacy given comorbidities, need for abx for uti and diverticulitis (to complete)  - will do vantin for 3 days Fu with pcp, dc in good condition Pt tolerated po well multiple times   Final Clinical Impressions(s) / ED Diagnoses   Final diagnoses:  Urinary tract infection without hematuria, site unspecified  Abdominal pain, unspecified abdominal location    New Prescriptions New Prescriptions   CEFPODOXIME (VANTIN) 100 MG TABLET    Take 1 tablet (100 mg total) by mouth 2 (two) times daily.   METOCLOPRAMIDE (REGLAN) 10 MG TABLET    Take 1 tablet (10 mg total) by mouth every 6 (six) hours.     METOCLOPRAMIDE (REGLAN) 10 MG TABLET    Take 1 tablet (10 mg total) by mouth every 6 (six) hours.     Karma Greaser, MD 08/14/16 1705    Merrily Pew, MD 08/15/16 [phone]

## 2016-08-14 NOTE — Telephone Encounter (Signed)
Patient reports that she was diagnosed with diverticulitis.  She is on antibiotics, but having nausea and vomiting. She will come in tomorrow and see Nicoletta Ba PA

## 2016-08-14 NOTE — ED Notes (Signed)
Pt requesting to wait in wheelchair for her daughter. Pt placed inf ront of Nurse first desk

## 2016-08-14 NOTE — Discharge Instructions (Signed)
Adrenals/Urinary Tract: Negative for renal obstruction or stone. 19 mm mass left posterior kidney unchanged from prior studies. This was shown to be a solid enhancing lesion on the prior MRI when it measured 16 x 13 mm. This may represent a small renal cell carcinoma. Urinary bladder normal.

## 2016-08-14 NOTE — ED Notes (Signed)
PT assisted onto bedpan. PT voids urine. PT removed from bedpan and dressed. PT has no further requests at this time. Call bell in reach

## 2016-08-14 NOTE — ED Triage Notes (Signed)
324 asa given by ems.

## 2016-08-14 NOTE — ED Triage Notes (Signed)
Per [facility], last Tuesday pt here for abdominal pain and chest pain, dx with diverticulitis. Meds for that made her sick on her stomach, poor appetite, vomiting with meds. Burning sensation in chest from vomiting. EKG by ems unremarkable.

## 2016-08-14 NOTE — ED Notes (Signed)
PT taken to radiology. 

## 2016-08-14 NOTE — ED Notes (Signed)
Attempted to get blood draw, unsuccessful. Will notifiy Phlebotomy to attempt

## 2016-08-15 ENCOUNTER — Ambulatory Visit: Payer: Medicare Other | Admitting: Physician Assistant

## 2016-08-16 LAB — URINE CULTURE: Culture: 10000 — AB

## 2016-08-17 ENCOUNTER — Telehealth (HOSPITAL_BASED_OUTPATIENT_CLINIC_OR_DEPARTMENT_OTHER): Payer: Self-pay

## 2016-08-17 NOTE — Telephone Encounter (Signed)
Post ED Visit - Positive Culture Follow-up  Culture report reviewed by antimicrobial stewardship pharmacist:  [x]  Elenor Quinones, Pharm.D. []  Heide Guile, Pharm.D., BCPS []  Parks Neptune, Pharm.D. []  Alycia Rossetti, Pharm.D., BCPS []  Camino Tassajara, Pharm.D., BCPS, AAHIVP []  Legrand Como, Pharm.D., BCPS, AAHIVP []  Milus Glazier, Pharm.D. []  Stephens November, [place].D.  Positive urine culture, >/= 10,000 colonies -> Proteus Mirabilis  Treated with Cefpodoxime, & Amoxicillin, organism sensitive to the same and no further patient follow-up is required at this time.  Dortha Kern 08/17/2016, 11:15 AM

## 2016-08-27 ENCOUNTER — Other Ambulatory Visit: Payer: Self-pay | Admitting: Family Medicine

## 2016-08-28 NOTE — Telephone Encounter (Signed)
Call in #120 with 5 rf 

## 2016-08-29 ENCOUNTER — Telehealth: Payer: Self-pay | Admitting: Family Medicine

## 2016-08-29 NOTE — Telephone Encounter (Signed)
° ° ° ° ° °  Pt ask for a refill on the following med that is ear drops  [name]

## 2016-08-29 NOTE — Telephone Encounter (Signed)
Call in Ciprodex drops to place 4 drops in both ears bid as needed, 7.5 ml bottle with 5 rf

## 2016-08-30 ENCOUNTER — Telehealth: Payer: Self-pay | Admitting: Family Medicine

## 2016-08-30 MED ORDER — CIPROFLOXACIN-DEXAMETHASONE 0.3-0.1 % OT SUSP: 4.0000 [drp] | Freq: Two times a day (BID) | OTIC | 5 refills | Status: DC | PRN

## 2016-08-30 NOTE — Telephone Encounter (Signed)
Rx sent 

## 2016-08-30 NOTE — Telephone Encounter (Signed)
Pt needs refill on tramadol #120 [facility] [address]

## 2016-08-31 ENCOUNTER — Other Ambulatory Visit: Payer: Self-pay | Admitting: Family Medicine

## 2016-08-31 DIAGNOSIS — M069 Rheumatoid arthritis, unspecified: Secondary | ICD-10-CM | POA: Diagnosis not present

## 2016-08-31 NOTE — Telephone Encounter (Signed)
I called pharmacy and they will get script ready.

## 2016-08-31 NOTE — Telephone Encounter (Signed)
We did this 2 days ago

## 2016-09-03 ENCOUNTER — Ambulatory Visit: Payer: Medicare Other | Admitting: Physician Assistant

## 2016-09-05 DIAGNOSIS — D49519 Neoplasm of unspecified behavior of unspecified kidney: Secondary | ICD-10-CM | POA: Diagnosis not present

## 2016-09-05 DIAGNOSIS — N3000 Acute cystitis without hematuria: Secondary | ICD-10-CM | POA: Diagnosis not present

## 2016-09-05 DIAGNOSIS — D3000 Benign neoplasm of unspecified kidney: Secondary | ICD-10-CM | POA: Diagnosis not present

## 2016-09-10 DIAGNOSIS — Z79899 Other long term (current) drug therapy: Secondary | ICD-10-CM | POA: Diagnosis not present

## 2016-09-10 DIAGNOSIS — M1A09X0 Idiopathic chronic gout, multiple sites, without tophus (tophi): Secondary | ICD-10-CM | POA: Diagnosis not present

## 2016-09-10 DIAGNOSIS — Z23 Encounter for immunization: Secondary | ICD-10-CM | POA: Diagnosis not present

## 2016-09-10 DIAGNOSIS — M0579 Rheumatoid arthritis with rheumatoid factor of multiple sites without organ or systems involvement: Secondary | ICD-10-CM | POA: Diagnosis not present

## 2016-09-10 DIAGNOSIS — M2550 Pain in unspecified joint: Secondary | ICD-10-CM | POA: Diagnosis not present

## 2016-09-10 LAB — CBC AND DIFFERENTIAL
HCT: 36 % (ref 36–46)
Hemoglobin: 11.4 g/dL — AB (ref 12.0–16.0)
Platelets: 234 10*3/uL (ref 150–399)
WBC: 5.8 10^3/mL

## 2016-09-10 LAB — BASIC METABOLIC PANEL
BUN: 14 mg/dL (ref 4–21)
Creatinine: 0.9 mg/dL (ref 0.5–1.1)
Glucose: 83 mg/dL
Potassium: 4.4 mmol/L (ref 3.4–5.3)
Sodium: 143 mmol/L (ref 137–147)

## 2016-09-10 LAB — HEPATIC FUNCTION PANEL
ALT: 14 U/L (ref 7–35)
AST: 18 U/L (ref 13–35)
Alkaline Phosphatase: 80 U/L (ref 25–125)
Bilirubin, Total: 0.4 mg/dL

## 2016-09-12 ENCOUNTER — Encounter: Payer: Self-pay | Admitting: Physician Assistant

## 2016-09-12 ENCOUNTER — Encounter (INDEPENDENT_AMBULATORY_CARE_PROVIDER_SITE_OTHER): Payer: Self-pay

## 2016-09-12 ENCOUNTER — Ambulatory Visit (INDEPENDENT_AMBULATORY_CARE_PROVIDER_SITE_OTHER): Payer: Medicare Other | Admitting: Physician Assistant

## 2016-09-12 VITALS — BP 120/70 | HR 82 | Ht 68.0 in | Wt 132.0 lb

## 2016-09-12 DIAGNOSIS — Z933 Colostomy status: Secondary | ICD-10-CM

## 2016-09-12 DIAGNOSIS — Z8719 Personal history of other diseases of the digestive system: Secondary | ICD-10-CM | POA: Diagnosis not present

## 2016-09-12 NOTE — Patient Instructions (Addendum)

## 2016-09-12 NOTE — Progress Notes (Addendum)
Subjective:    Patient ID: [name], female    DOB: Apr 08, 1956, 61 y.o.   MRN: [id number]  HPI [name] is a 61 year old African-American female, known to Dr. Carlean Purl and myself who was last seen in the office in September 2017. She comes in today to discuss colonoscopy. Patient last had colonoscopy in July 2004 with finding of severe diverticulosis and 2 tiny polyps were removed. 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 Other medical problems include history of small bowel obstruction, rheumatoid arthritis, Marfan's, congestive heart failure, pulmonary hypertension, atrial flutter and hypertension. When she was last seen in September prior echo had shown an EF of 20-25% and cardiac colonoscopy was delayed until repeat echo could be done. She did have an echo in October 2017 which showed her EF to be 40-45%. She has had problems over the past month or so with abdominal pain. She had an ER visit on 08/07/2016 and was told she had probable diverticulitis. She was given Cipro and Flagyl. She was then seen by Dr. Sarajane Jews. Apparently couldn't take the Flagyl and was switched to another antibiotic and completed a course. CT of the abdomen and pelvis done 08/14/2016 after she already been on antibiotics showed unchanged prominence of the distal pancreatic duct at 3.2 mm no change from 2016, extensive atherosclerosis, a huge ventral hernia containing bowel loops and no evidence of diverticulitis. She also has a 19 mm mass which is solid in the left kidney concerning for renal cell CA. Dr. Barbie Banner notes state that Dr. Diona Fanti is following this lesion. She says she feels better currently she is using stool softeners, appetite has improved and she has no complaints of abdominal pain. She's not see any melena or hematochezia.  Review of Systems Pertinent positive and negative review of systems were noted in the above HPI section.  All  other review of systems was otherwise negative.  Outpatient Encounter Prescriptions as of 09/12/2016  Medication Sig  . Abatacept (ORENCIA) 125 MG/ML SOSY Inject 1 application into the skin every Monday.   Marland Kitchen albuterol (PROVENTIL HFA;VENTOLIN HFA) 108 (90 BASE) MCG/ACT inhaler Inhale 1 puff into the lungs every 4 (four) hours as needed for wheezing or shortness of breath.  . allopurinol (ZYLOPRIM) 100 MG tablet take 1 tablet by mouth once daily  . amLODipine (NORVASC) 10 MG tablet take 1 tablet by mouth once daily  . aspirin EC 81 MG tablet Take 1 tablet (81 mg total) by mouth daily.  . betamethasone valerate ointment (VALISONE) 0.1 % Apply a small amount topically 2 x a day for 1-2 weeks (Patient taking differently: Apply 1 application topically 2 (two) times daily as needed (inflammation). Apply a small amount topically 2 x a day for 1-2 weeks)  . Calcium Carbonate-Vitamin D (CALCIUM + D PO) Take 2.5 tablets by mouth daily.   . cetirizine (ZYRTEC) 10 MG tablet Take 1 tablet (10 mg total) by mouth daily.  . ciprofloxacin-dexamethasone (CIPRODEX) otic suspension Place 4 drops into both ears 2 (two) times daily as needed.  . diazepam (VALIUM) 2 MG tablet Take 1 tablet (2 mg total) by mouth every 12 (twelve) hours as needed (dizziness).  Marland Kitchen docusate sodium (COLACE) 100 MG capsule Take 100 mg by mouth 2 (two) times daily.  . folic acid (FOLVITE) 1 MG tablet Take 1 mg by mouth daily.  Marland Kitchen HYDROcodone-acetaminophen (NORCO) 10-325 MG tablet Take 1 tablet by mouth every 6 (six) hours as  needed for moderate pain.  Marland Kitchen ketoconazole (NIZORAL) 2 % cream Apply 1 application topically 2 (two) times daily. (Patient taking differently: Apply 1 application topically 2 (two) times daily as needed for irritation. )  . megestrol (MEGACE ES) 625 MG/5ML suspension take 5 milliliters by mouth twice a day  . Methotrexate Sodium (METHOTREXATE, PF,) 50 MG/2ML injection Inject 0.5 mLs into the muscle every Monday.  .  metoCLOPramide (REGLAN) 10 MG tablet Take 1 tablet (10 mg total) by mouth every 6 (six) hours.  . Multiple Vitamin (MULTIVITAMIN WITH MINERALS) TABS tablet Take 1 tablet by mouth daily.  Marland Kitchen NEXIUM 40 MG capsule Take 1 capsule (40 mg total) by mouth 2 (two) times daily. (Patient taking differently: Take 40 mg by mouth daily. )  . ondansetron (ZOFRAN) 4 MG tablet Take 1 tablet (4 mg total) by mouth every 8 (eight) hours as needed for nausea or vomiting.  . polyethylene glycol (MIRALAX / GLYCOLAX) packet Take 17 g by mouth daily. (Patient taking differently: Take 17 g by mouth daily as needed for moderate constipation. )  . traMADol (ULTRAM) 50 MG tablet take 1-2 tablets by mouth every 6 hours if needed  . vitamin C (ASCORBIC ACID) 500 MG tablet Take 500 mg by mouth daily.   . [DISCONTINUED] metoCLOPramide (REGLAN) 10 MG tablet Take 1 tablet (10 mg total) by mouth every 6 (six) hours.  . [DISCONTINUED] risedronate (ACTONEL) 150 MG tablet Take 1 tablet (150 mg total) by mouth every 30 (thirty) days. with water on empty stomach, nothing by mouth or lie down for next 30 minutes.   No facility-administered encounter medications on file as of 09/12/2016.    No Known Allergies Patient Active Problem List   Diagnosis Date Noted  . Colitis 08/07/2016  . Chronic systolic CHF (congestive heart failure) ([place])   . Hyperglycemia   . Ventral hernia without obstruction or gangrene 06/06/2015  . Renal mass   . SBO (small bowel obstruction) ([place])   . Small bowel obstruction 05/01/2014  . Pulmonary HTN ([place]) 01/01/2014  . Tachycardia 01/01/2014  . Acute renal failure ([place]) 01/01/2014  . Iron deficiency anemia 01/01/2014  . Atrial flutter ([place]) 12/31/2013  . Encounter for ostomy care education 11/30/2013  . Stoma dermatitis 11/30/2013  . Incisional hernia 11/30/2013  . Constipation, chronic 11/30/2013  . Charcot's joint of foot 07/07/2013  . Bilateral leg edema 04/20/2013  . Diverticulitis of colon with  perforation s/p colectomy/ostomy 11/28/2012 01/05/2013  . Rheumatoid arthritis ([place]) 06/07/2009  . THYROID NODULE 07/01/2007  . LUNG NODULE 07/01/2007  . Osteoporosis 05/07/2007  . 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 Blindness 04/12/2007  . HEMORRHOIDS, INTERNAL 04/12/2007  . 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 HYPERLIPIDEMIA 01/07/2007  . Essential hypertension 01/07/2007  . GERD 01/07/2007  . Marfan's syndrome 01/07/2007   Social History   Social History  . Marital status: Single    Spouse name: N/A  . Number of children: 1  . Years of education: N/A   Occupational History  . Not on file.   Social History Main Topics  . Smoking status: Former Smoker    Packs/day: 1.00    Types: Cigarettes    Quit date: 11/27/2012  . Smokeless tobacco: Never Used  . Alcohol use No     Comment: recovering alcoholic sober since 9937  . Drug use: No  . Sexual activity: No   Other Topics Concern  . Not on file   Social History Narrative  . No narrative on  file    Ms. Eldridge's family history is not on file.      Objective:    Vitals:   09/12/16 1119  BP: 120/70  Pulse: 82    Physical Exam Well-developed older African-American female in no acute distress, she comes in in a wheelchair, accompanied by her daughter blood pressure 120/70 pulse 82 height 5 foot 8, weight 132, BMI 20.0. HEENT; nontraumatic normocephalic EOMI PERRLA sclera anicteric, Cardiovascular; regular rate and rhythm with S1-S2, Pulmonary; clear bilaterally, Abdomen; soft, bowel sounds are present she has an ostomy in the left mid abdomen and a large ventral hernia which is soft and nontender, midline incisional scars, Rectal ;exam not done, Extremities; severe rheumatoid arthritic deformities of the hands and feet Neuropsych; mood and affect appropriate       Assessment & Plan:   #63 61 year old African-American female with recent episode of presumed diverticulitis and known severe diverticulosis, overdue for  follow-up colonoscopy. Patient is status post partial colectomy and colostomy 8768 for complicated diverticular disease. Hypertension Exline #3 history of atrial flutter Number for pulmonary hypertension #5 congestive heart failure with EF improved at 40-45% #6 Marfan's #7 rheumatoid arthritis #8 history of small bowel obstruction #9 very large ventral hernia containing bowel  Plan; patient will be scheduled for colonoscopy with Dr. Carlean Purl. She and her daughter assure me she is ambulatory at home with a walker and will have no trouble getting on an exam table. Colonoscopy was discussed in detail with the patient and her daughter and they are agreeable to proceed. We discussed general management of diverticulosis with high-fiber diet, continuing stool softeners.  Amy Genia Harold PA-C 09/12/2016   Cc: [name], MD Agree with Ms. Genia Harold assessment and plan. Gatha Mayer, MD, [facility]

## 2016-09-13 DIAGNOSIS — Z933 Colostomy status: Secondary | ICD-10-CM | POA: Diagnosis not present

## 2016-09-14 ENCOUNTER — Encounter: Payer: Self-pay | Admitting: Family Medicine

## 2016-09-17 ENCOUNTER — Other Ambulatory Visit: Payer: Self-pay | Admitting: Family Medicine

## 2016-09-17 DIAGNOSIS — Z1231 Encounter for screening mammogram for malignant neoplasm of breast: Secondary | ICD-10-CM | POA: Diagnosis not present

## 2016-09-17 LAB — HM MAMMOGRAPHY

## 2016-09-18 ENCOUNTER — Encounter: Payer: Self-pay | Admitting: Family Medicine

## 2016-09-24 ENCOUNTER — Telehealth: Payer: Self-pay | Admitting: Certified Nurse Midwife

## 2016-09-24 NOTE — Telephone Encounter (Signed)
Spoke with patient. Patient states that she had a mammogram last week that returned negative. States that over the last few days she has noticed a raised area around the left nipple. Denies pain, nipple discharge, redness, or swelling. Advised patient she will need to be seen in the office for further evaluation. Patient is agreeable. Appointment scheduled for 10/02/2016 at 11 am with Melvia Heaps CNM. Patient declines all earlier offered appointments. Aware if her symptoms worsen or she develops new symptoms will need to contact the office for an earlier appointment.  .Routing to provider for final review. Patient agreeable to disposition. Will close encounter.

## 2016-09-24 NOTE — Telephone Encounter (Signed)
Patient has a mammogram last week and states it was negative.  Patient says she has a raised area in the left nipple no pain or discharge.

## 2016-09-25 ENCOUNTER — Encounter: Payer: Self-pay | Admitting: Family Medicine

## 2016-09-28 ENCOUNTER — Telehealth: Payer: Self-pay | Admitting: Certified Nurse Midwife

## 2016-09-28 NOTE — Telephone Encounter (Signed)
Patient cancelled her breast recheck appointment because she is seeing her pcp Tuesday for heart issues she's having. Will call back to reschedule.

## 2016-10-02 ENCOUNTER — Ambulatory Visit: Payer: Medicare Other | Admitting: Certified Nurse Midwife

## 2016-10-02 ENCOUNTER — Ambulatory Visit (INDEPENDENT_AMBULATORY_CARE_PROVIDER_SITE_OTHER): Payer: Medicare Other | Admitting: Family Medicine

## 2016-10-02 ENCOUNTER — Encounter: Payer: Self-pay | Admitting: Family Medicine

## 2016-10-02 VITALS — BP 120/74 | Temp 98.9°F

## 2016-10-02 DIAGNOSIS — I10 Essential (primary) hypertension: Secondary | ICD-10-CM

## 2016-10-02 DIAGNOSIS — R002 Palpitations: Secondary | ICD-10-CM | POA: Diagnosis not present

## 2016-10-02 NOTE — Progress Notes (Signed)
Pre visit review using our clinic review tool, if applicable. No additional management support is needed unless otherwise documented below in the visit note. Pt unable to stand and weigh.   

## 2016-10-02 NOTE — Progress Notes (Signed)
   Subjective:    Patient ID: [name], female    DOB: 14-Nov-1955, 61 y.o.   MRN: [id number]  HPI Here for 2 episodes in the past 2 weeks of waking up in the morning with her heart racing and feeling like her body is shaking. No LOC and she remembers everything. No chest pain or SOB. te shaking lasts about 30 seconds and stops, and the heart racing lasts a couple of minutes and stops. The last episode of this occurred 5 days ago. Today she feels fine. She says these spells remind her of what it felt like when she was dealing with atrial flutter 3 years ago. She had an ablation in 2015 which was successful. She had labs several weeks ago that were unremarkable.    Review of Systems  Constitutional: Negative.   Respiratory: Negative.   Cardiovascular: Positive for palpitations. Negative for chest pain and leg swelling.  Gastrointestinal: Negative.   Neurological: Negative.        Objective:   Physical Exam  Constitutional: She is oriented to person, place, and time. She appears well-developed and well-nourished.  Neck: No thyromegaly present.  Cardiovascular: Normal rate, regular rhythm, normal heart sounds and intact distal pulses.   Pulmonary/Chest: Effort normal and breath sounds normal. No respiratory distress. She has no wheezes. She has no rales.  Lymphadenopathy:    She has no cervical adenopathy.  Neurological: She is alert and oriented to person, place, and time.          Assessment & Plan:  Episodes of heart racing which do raise the possibility of atrial flutter or fibrillation. We will arrange for her to see Cardiology again.  [name], MD

## 2016-10-02 NOTE — Patient Instructions (Signed)
WE NOW OFFER   [name]'s FAST TRACK!!!  SAME DAY Appointments for ACUTE CARE  Such as: Sprains, Injuries, cuts, abrasions, rashes, muscle pain, joint pain, back pain Colds, flu, sore throats, headache, allergies, cough, fever  Ear pain, sinus and eye infections Abdominal pain, nausea, vomiting, diarrhea, upset stomach Animal/insect bites  3 Easy Ways to Schedule: Walk-In Scheduling Call in scheduling Mychart Sign-up: [email or web address]         

## 2016-10-11 DIAGNOSIS — M069 Rheumatoid arthritis, unspecified: Secondary | ICD-10-CM | POA: Diagnosis not present

## 2016-10-11 DIAGNOSIS — Z933 Colostomy status: Secondary | ICD-10-CM | POA: Diagnosis not present

## 2016-10-29 ENCOUNTER — Telehealth: Payer: Self-pay | Admitting: Certified Nurse Midwife

## 2016-10-29 NOTE — Telephone Encounter (Signed)
Left message to call and schedule follow up breast appointment.

## 2016-11-02 ENCOUNTER — Telehealth: Payer: Self-pay | Admitting: Internal Medicine

## 2016-11-02 NOTE — Telephone Encounter (Signed)
Returned patients call regarding Miralax prep instructions. After extensive review, patient verbalizes understanding.   Riki Sheer, LPN

## 2016-11-05 ENCOUNTER — Telehealth: Payer: Self-pay | Admitting: Internal Medicine

## 2016-11-05 NOTE — Telephone Encounter (Signed)
Agree. thanks

## 2016-11-05 NOTE — Telephone Encounter (Signed)
Returned pt's call . Pt is scheduled for colonoscopy tomorrow and says she started itching after she took the first 2 Dulcolax tablets as ordered . Pt has not started the Miralax at this time. When asked pt where she is itching she says itching has stopped now but was on her legs . Pt denies any rash ,shortness of breath ,trouble breathing or other symptoms of allergic reaction the patient denies having taken any new medications other than the Dulcolax . Told pt to continue with Miralax but not to take last 2 Dulcolax ordered.

## 2016-11-06 ENCOUNTER — Ambulatory Visit (AMBULATORY_SURGERY_CENTER): Payer: Medicare Other | Admitting: Internal Medicine

## 2016-11-06 ENCOUNTER — Encounter: Payer: Self-pay | Admitting: Internal Medicine

## 2016-11-06 VITALS — BP 127/83 | HR 74 | Temp 98.0°F | Resp 19 | Ht 68.0 in | Wt 132.0 lb

## 2016-11-06 DIAGNOSIS — Z1212 Encounter for screening for malignant neoplasm of rectum: Secondary | ICD-10-CM | POA: Diagnosis not present

## 2016-11-06 DIAGNOSIS — I10 Essential (primary) hypertension: Secondary | ICD-10-CM | POA: Diagnosis not present

## 2016-11-06 DIAGNOSIS — Z1211 Encounter for screening for malignant neoplasm of colon: Secondary | ICD-10-CM

## 2016-11-06 DIAGNOSIS — D122 Benign neoplasm of ascending colon: Secondary | ICD-10-CM | POA: Diagnosis not present

## 2016-11-06 MED ORDER — SODIUM CHLORIDE 0.9 % IV SOLN: 500.0000 mL | INTRAVENOUS | Status: DC

## 2016-11-06 NOTE — Progress Notes (Signed)
Called to room to assist during endoscopic procedure.  Patient ID and intended procedure confirmed with present staff. Received instructions for my participation in the procedure from the performing physician.  

## 2016-11-06 NOTE — Progress Notes (Signed)
Pt's states no medical or surgical changes since previsit or office visit. 

## 2016-11-06 NOTE — Op Note (Signed)
[facility] Patient Name: [name] Procedure Date: 11/06/2016 8:49 AM MRN: [id number] Endoscopist: Gatha Mayer , MD Age: 61 Referring MD:  Date of Birth: 02-23-1956 Gender: Female Account #: [id number] Procedure:                Colonoscopy Indications:              Screening for colorectal malignant neoplasm Medicines:                Propofol per Anesthesia, Monitored Anesthesia Care Procedure:                Pre-Anesthesia Assessment:                           - Prior to the procedure, a History and Physical                            was performed, and patient medications and                            allergies were reviewed. The patient's tolerance of                            previous anesthesia was also reviewed. The risks                            and benefits of the procedure and the sedation                            options and risks were discussed with the patient.                            All questions were answered, and informed consent                            was obtained. Prior Anticoagulants: The patient                            last took aspirin 1 day prior to the procedure. ASA                            Grade Assessment: III - A patient with severe                            systemic disease. After reviewing the risks and                            benefits, the patient was deemed in satisfactory                            condition to undergo the procedure.                           After obtaining informed consent, the colonoscope  was passed under direct vision. Throughout the                            procedure, the patient's blood pressure, pulse, and                            oxygen saturations were monitored continuously. The                            Model PCF-[id number]) scope was introduced                            through the descending colostomy and advanced to   the the cecum, identified by appendiceal orifice                            and ileocecal valve. The colonoscopy was performed                            without difficulty. The patient tolerated the                            procedure well. The quality of the bowel                            preparation was good. The ileocecal valve,                            appendiceal orifice, and rectum were photographed.                            The bowel preparation used was Miralax. Scope In: 9:05:27 AM Scope Out: 9:35:50 AM Scope Withdrawal Time: 0 hours 26 minutes 50 seconds  Total Procedure Duration: 0 hours 30 minutes 23 seconds  Findings:                 The perianal and digital rectal examinations were                            normal.                           A 3 mm polyp was found in the ascending colon. The                            polyp was sessile. The polyp was removed with a                            cold snare. Resection and retrieval were complete.                            Verification of patient identification for the                            specimen was done.  Scattered small and large-mouthed diverticula were                            found in the entire colon.                           The exam was otherwise without abnormality. Complications:            No immediate complications. Estimated Blood Loss:     Estimated blood loss was minimal. Impression:               - One 3 mm polyp in the ascending colon, removed                            with a cold snare. Resected and retrieved.                           - Diverticulosis in the entire examined colon.                           - The examination was otherwise normal. Recommendation:           - Patient has a contact number available for                            emergencies. The signs and symptoms of potential                            delayed complications were discussed with the                             patient. Return to normal activities tomorrow.                            Written discharge instructions were provided to the                            patient.                           - Continue present medications.                           - Repeat colonoscopy is recommended. The                            colonoscopy date will be determined after pathology                            results from today's exam become available for                            review.                           - Resume previous diet. Gatha Mayer, MD 11/06/2016 9:46:22 AM This report  has been signed electronically.

## 2016-11-06 NOTE — Patient Instructions (Addendum)
Only 1 polyp - removed - looks ok - benign.  I will let you know pathology results and when to have another routine colonoscopy by mail and/or My Chart.  I appreciate the opportunity to care for you. Gatha Mayer, MD, FACG   YOU HAD AN ENDOSCOPIC PROCEDURE TODAY AT [facility]:   Refer to the procedure report that was given to you for any specific questions about what was found during the examination.  If the procedure report does not answer your questions, please call your gastroenterologist to clarify.  If you requested that your care partner not be given the details of your procedure findings, then the procedure report has been included in a sealed envelope for you to review at your convenience later.  YOU SHOULD EXPECT: Some feelings of bloating in the abdomen. Passage of more gas than usual.  Walking can help get rid of the air that was put into your GI tract during the procedure and reduce the bloating. If you had a lower endoscopy (such as a colonoscopy or flexible sigmoidoscopy) you may notice spotting of blood in your stool or on the toilet paper. If you underwent a bowel prep for your procedure, you may not have a normal bowel movement for a few days.  Please Note:  You might notice some irritation and congestion in your nose or some drainage.  This is from the oxygen used during your procedure.  There is no need for concern and it should clear up in a day or so.  SYMPTOMS TO REPORT IMMEDIATELY:   Following lower endoscopy (colonoscopy or flexible sigmoidoscopy):  Excessive amounts of blood in the stool  Significant tenderness or worsening of abdominal pains  Swelling of the abdomen that is new, acute  Fever of 100F or higher  For urgent or emergent issues, a gastroenterologist can be reached at any hour by calling [phone].   Please read all handouts given to you by your recovery nurse.  DIET:  We do recommend a small meal at first, but then  you may proceed to your regular diet.  Drink plenty of fluids but you should avoid alcoholic beverages for 24 hours.  ACTIVITY:  You should plan to take it easy for the rest of today and you should NOT DRIVE or use heavy machinery until tomorrow (because of the sedation medicines used during the test).    FOLLOW UP: Our staff will call the number listed on your records the next business day following your procedure to check on you and address any questions or concerns that you may have regarding the information given to you following your procedure. If we do not reach you, we will leave a message.  However, if you are feeling well and you are not experiencing any problems, there is no need to return our call.  We will assume that you have returned to your regular daily activities without incident.  If any biopsies were taken you will be contacted by phone or by letter within the next 1-3 weeks.  Please call us at [phone] if you have not heard about the biopsies in 3 weeks.    SIGNATURES/CONFIDENTIALITY: You and/or your care partner have signed paperwork which will be entered into your electronic medical record.  These signatures attest to the fact that that the information above on your After Visit Summary has been reviewed and is understood.  Full responsibility of the confidentiality of this discharge information lies with you and/or  your care-partner.  Thank you for letting us take care of your healthcare needs today.

## 2016-11-06 NOTE — Progress Notes (Signed)
To PACU VSS, Report to RN 

## 2016-11-07 ENCOUNTER — Telehealth: Payer: Self-pay

## 2016-11-07 ENCOUNTER — Telehealth: Payer: Self-pay | Admitting: *Deleted

## 2016-11-07 NOTE — Telephone Encounter (Signed)
Unable to leave message number disconnected. 

## 2016-11-07 NOTE — Telephone Encounter (Signed)
  Follow up Call-  Call back number 11/06/2016  Post procedure Call Back phone  # [phone]  Permission to leave phone message Yes  Some recent data might be hidden   No answer, left message to call if questions or concerns.

## 2016-11-08 ENCOUNTER — Telehealth: Payer: Self-pay | Admitting: Internal Medicine

## 2016-11-08 NOTE — Telephone Encounter (Signed)
Patient notified that it may take a few days to establish her normal bowel habits.  She will resume her colace and call back if she has any GI questions or concerns./

## 2016-11-09 ENCOUNTER — Telehealth: Payer: Self-pay | Admitting: Family Medicine

## 2016-11-09 DIAGNOSIS — Z933 Colostomy status: Secondary | ICD-10-CM | POA: Diagnosis not present

## 2016-11-09 MED ORDER — HYDROCODONE-ACETAMINOPHEN 10-325 MG PO TABS: 1.0000 | ORAL_TABLET | Freq: Four times a day (QID) | ORAL | 0 refills | Status: DC | PRN

## 2016-11-09 NOTE — Telephone Encounter (Signed)
done

## 2016-11-09 NOTE — Telephone Encounter (Signed)
Spoke with pt and advised rx at front desk. Nothing further needed.

## 2016-11-09 NOTE — Telephone Encounter (Signed)
° ° °  Pt request refill of the following: ° ° °HYDROcodone-acetaminophen (NORCO) 10-325 MG tablet ° ° °Phamacy: °

## 2016-11-12 ENCOUNTER — Encounter (INDEPENDENT_AMBULATORY_CARE_PROVIDER_SITE_OTHER): Payer: Self-pay

## 2016-11-12 ENCOUNTER — Ambulatory Visit (INDEPENDENT_AMBULATORY_CARE_PROVIDER_SITE_OTHER): Payer: Medicare Other | Admitting: Interventional Cardiology

## 2016-11-12 ENCOUNTER — Encounter: Payer: Self-pay | Admitting: Interventional Cardiology

## 2016-11-12 VITALS — BP 132/80 | HR 79

## 2016-11-12 DIAGNOSIS — R002 Palpitations: Secondary | ICD-10-CM

## 2016-11-12 DIAGNOSIS — I4892 Unspecified atrial flutter: Secondary | ICD-10-CM | POA: Diagnosis not present

## 2016-11-12 DIAGNOSIS — Q8740 Marfan's syndrome, unspecified: Secondary | ICD-10-CM | POA: Diagnosis not present

## 2016-11-12 DIAGNOSIS — R000 Tachycardia, unspecified: Secondary | ICD-10-CM

## 2016-11-12 DIAGNOSIS — I272 Pulmonary hypertension, unspecified: Secondary | ICD-10-CM | POA: Diagnosis not present

## 2016-11-12 DIAGNOSIS — I5042 Chronic combined systolic (congestive) and diastolic (congestive) heart failure: Secondary | ICD-10-CM

## 2016-11-12 DIAGNOSIS — M069 Rheumatoid arthritis, unspecified: Secondary | ICD-10-CM

## 2016-11-12 DIAGNOSIS — I43 Cardiomyopathy in diseases classified elsewhere: Secondary | ICD-10-CM

## 2016-11-12 NOTE — Progress Notes (Signed)
Cardiology Office Note    Date:  11/12/2016   ID:  [name], [name] Dec 17, 1955, MRN [id number]  PCP:  Christina Morale, MD  Cardiologist: Christina Grooms, MD   Chief Complaint  Patient presents with  . Palpitations    History of Present Illness:  [name] is a 61 y.o. female referred by [name] for evaluation of palpitations described as follows:  "...2 episodes in the past 2 weeks of waking up in the morning with her heart racing and feeling like her body is shaking. No LOC and she remembers everything. No chest pain or SOB. The shaking lasts about 30 seconds and stops, and the heart racing lasts a couple of minutes and stops..." From office visit 10/02/16 as dictated by Dr. Sarajane [name].  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 She underwent successful catheter ablation of 2 distinct reentrant circuits by Dr. Lovena Le. On tachycardia with typical atrial flutter and the other originated from the lateral right atrial wall near the crista terminalis.  Since the episode that led to this consultation, she has felt well. She only had 2 episodes as described above. She has never had episodes similar to this. She denies angina. She does remind me that she has "Marfan's". She has rheumatoid arthritis with significant musculoskeletal abnormality and joint effusion. She denies orthopnea, PND, edema, and syncope.  Past Medical History:  Diagnosis Date  . Atrial flutter ([place])   . Chronic gout 2008  . Diverticulitis of large intestine with perforation   . Fibroid   . Hearing aid worn    pt wears bilateral hearing aids  . Hernia 4/08  . Hypertension   . Legally blind    since pt was a teenager  . Marfan's syndrome affecting skin    with scolosis  . Osteoporosis   . Rheumatoid arthritis [facility])    sees Dr. Gavin Pound   . SBO (small bowel obstruction) ([place]) 01/05/2013  . Small bowel obstruction due to  adhesions ([place])   . Status post bunionectomy 1/10   bilateral   . Stroke ([place]) 2/08  . Substance abuse    recovering alcoholic P50 years   . 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 ABLATION  01/04/14   atrial flutter ablation (2 circuits) by Dr Lovena Le  . ATRIAL FLUTTER ABLATION N/A 01/04/2014   Procedure: ATRIAL FLUTTER ABLATION;  Surgeon: Evans Lance, MD;  Location: [facility] CATH LAB;  Service: Cardiovascular;  Laterality: N/A;  . BUNIONECTOMY Bilateral 1/10  . COLON SURGERY     colectomy  . COLOSTOMY  11/27/2012  . HERNIA REPAIR    . LAPAROSCOPIC ABDOMINAL EXPLORATION N/A 01/06/2013   Procedure: LAPAROSCOPIC ABDOMINAL EXPLORATION;  Surgeon: Adin Hector, MD;  Location: [facility] ORS;  Service: General;  Laterality: N/A;  . LAPAROSCOPIC LYSIS OF ADHESIONS N/A 01/06/2013   Procedure: LAPAROSCOPIC LYSIS OF ADHESIONS/ INTEROTOMY REPAIR;  Surgeon: Adin Hector, MD;  Location: [facility] ORS;  Service: General;  Laterality: N/A;  . LAPAROTOMY N/A 11/27/2012   Procedure: EXPLORATORY LAPAROTOMY SIGMOID COLECTOMY, COLOSTOMY;  Surgeon: Madilyn Hook, DO;  Location: [facility] ORS;  Service: General;  Laterality: N/A;  . TOTAL KNEE ARTHROPLASTY Bilateral 6/08    Current Medications: Outpatient Medications Prior to Visit  Medication Sig Dispense Refill  . Abatacept (ORENCIA) 125 MG/ML SOSY Inject 1 application into the  skin every Monday.     Marland Kitchen albuterol (PROVENTIL HFA;VENTOLIN HFA) 108 (90 BASE) MCG/ACT inhaler Inhale 1 puff into the lungs every 4 (four) hours as needed for wheezing or shortness of breath. 1 Inhaler 11  . allopurinol (ZYLOPRIM) 100 MG tablet take 1 tablet by mouth once daily 30 tablet 5  . amLODipine (NORVASC) 10 MG tablet take 1 tablet by mouth once daily 30 tablet 5  . aspirin EC 81 MG tablet Take 1 tablet (81 mg total) by mouth daily.    . betamethasone valerate ointment (VALISONE) 0.1 % Apply a small amount topically 2 x a day for  1-2 weeks (Patient taking differently: Apply 1 application topically 2 (two) times daily as needed (inflammation). Apply a small amount topically 2 x a day for 1-2 weeks) 30 g 0  . Calcium Carbonate-Vitamin D (CALCIUM + D PO) Take 2.5 tablets by mouth daily.     . cetirizine (ZYRTEC) 10 MG tablet Take 1 tablet (10 mg total) by mouth daily. 90 tablet 1  . ciprofloxacin-dexamethasone (CIPRODEX) otic suspension Place 4 drops into both ears 2 (two) times daily as needed. 7.5 mL 5  . diazepam (VALIUM) 2 MG tablet Take 1 tablet (2 mg total) by mouth every 12 (twelve) hours as needed (dizziness). 60 tablet 0  . docusate sodium (COLACE) 100 MG capsule Take 100 mg by mouth 2 (two) times daily.    . folic acid (FOLVITE) 1 MG tablet Take 1 mg by mouth daily.    Marland Kitchen HYDROcodone-acetaminophen (NORCO) 10-325 MG tablet Take 1 tablet by mouth every 6 (six) hours as needed for moderate pain. 120 tablet 0  . ketoconazole (NIZORAL) 2 % cream Apply 1 application topically 2 (two) times daily. (Patient taking differently: Apply 1 application topically 2 (two) times daily as needed for irritation. ) 30 g 5  . megestrol (MEGACE ES) 625 MG/5ML suspension take 5 milliliters by mouth twice a day 150 mL 0  . Methotrexate Sodium (METHOTREXATE, PF,) 50 MG/2ML injection Inject 0.5 mLs into the muscle every Monday.  0  . metoCLOPramide (REGLAN) 10 MG tablet Take 1 tablet (10 mg total) by mouth every 6 (six) hours. 30 tablet 0  . Multiple Vitamin (MULTIVITAMIN WITH MINERALS) TABS tablet Take 1 tablet by mouth daily.    Marland Kitchen NEXIUM 40 MG capsule take 1 capsule by mouth twice a day 60 capsule 3  . ondansetron (ZOFRAN) 4 MG tablet Take 1 tablet (4 mg total) by mouth every 8 (eight) hours as needed for nausea or vomiting. 12 tablet 0  . traMADol (ULTRAM) 50 MG tablet take 1-2 tablets by mouth every 6 hours if needed 120 tablet 5  . vitamin C (ASCORBIC ACID) 500 MG tablet Take 500 mg by mouth daily.      Facility-Administered Medications  Prior to Visit  Medication Dose Route Frequency Provider Last Rate Last Dose  . 0.9 %  sodium chloride infusion  500 mL Intravenous Continuous Gatha Mayer, MD         Allergies:   Patient has no known allergies.   Social History   Social History  . Marital status: Single    Spouse name: N/A  . Number of children: 1  . Years of education: N/A   Social History Main Topics  . Smoking status: Former Smoker    Packs/day: 1.00    Types: Cigarettes    Quit date: 11/27/2012  . Smokeless tobacco: Never Used  . Alcohol use No  Comment: recovering alcoholic sober since 2202  . Drug use: No  . Sexual activity: No   Other Topics Concern  . None   Social History Narrative  . None     Family History:  The patient's family history is not on file.   ROS:   Please see the history of present illness.    Skipped heartbeats and irregular heartbeats . Joint immobility due to rheumatoid arthritis. She denies respiratory complaints and pulmonary involvement from rheumatoid arthritis. She has scoliosis. She is wheelchair-bound to move distances. She is able to ambulate about her house. All other systems reviewed and are negative.   PHYSICAL EXAM:   VS:  BP 132/80 (BP Location: Left Arm)   Pulse 79   LMP 06/25/2006 (LMP Unknown)    GEN: Well nourished, well developed, in no acute distress . Musculoskeletal deformity. Ulnar deviation in hands. Significant kyphoscoliosis. HEENT: normal  Neck: no JVD, carotid bruits, or masses Cardiac: RRR; no murmurs, rubs, or gallops,no edema  Respiratory:  clear to auscultation bilaterally, normal work of breathing GI: soft, nontender, nondistended, + BS MS: no deformity or atrophy . Significant kyphoscoliosis. Skin: warm and dry, no rash Neuro:  Alert and Oriented x 3, Strength and sensation are intact Psych: euthymic mood, full affect  Wt Readings from Last 3 Encounters:  11/06/16 132 lb (59.9 kg)  09/12/16 132 lb (59.9 kg)  08/07/16 128 lb  (58.1 kg)      Studies/Labs Reviewed:   EKG:  EKG  Performed on 08/14/16 revealed normal sinus rhythm with normal appearance  Recent Labs: 09/10/2016: ALT 14; BUN 14; Creatinine 0.9; Hemoglobin 11.4; Platelets 234; Potassium 4.4; Sodium 143   Lipid Panel    Component Value Date/Time   CHOL 142 12/02/2012 0356   TRIG 132 01/06/2013 0512   HDL 46 12/02/2012 0356   CHOLHDL 3.1 12/02/2012 0356   VLDL 22 12/02/2012 0356   LDLCALC 74 12/02/2012 0356    Additional studies/ records that were reviewed today include:  Echocardiogram 04/24/16: Study Conclusions  - Left ventricle: The cavity size was normal. Systolic function was   mildly to moderately reduced. The estimated ejection fraction was   in the range of 40% to 45%. Diffuse hypokinesis. Features are   consistent with a pseudonormal left ventricular filling pattern,   with concomitant abnormal relaxation and increased filling   pressure (grade 2 diastolic dysfunction). - Mitral valve: There was mild regurgitation directed eccentrically   and posteriorly. - Tricuspid valve: There was mild regurgitation.   When compared with the echo from July 2015, EF has increased from 20% to 45%.   ASSESSMENT:    1. Palpitations   2. Atrial flutter, unspecified type ([place])   3. Chronic combined systolic and diastolic heart failure ([place])   4. 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 Pulmonary HTN ([place])   6. Rheumatoid arthritis involving multiple sites, unspecified rheumatoid factor presence ([place])   7. Marfan's syndrome      PLAN:  In order of problems listed above:  1. With prior history of atrial flutter and also a neither right atrial tachycardia focus at the time of ablation, we will perform prolonged monitoring with a 30 day continuous device to exclude recurrent atrial arrhythmias, and especially atrial fibrillation. 2. Previously unrecognized, leading to acute systolic heart failure with EF of 20%. Ablation lead to  marked improvement in LV function with recent echo documenting EF in the 45% range. No recent EKG evidence of atrial flutter. No clinical evidence  on today's exam and EKG was not repeated. 3. Chronic combined systolic and diastolic heart failure without evidence of volume overload. 4. As noted above. No clinical evidence of this problem currently. 5. No echo documented evidence of pulmonary hypertension on the recently performed echocardiogram. Potential etiology of pulmonary hypertension is restrictive lung disease from kyphoscoliosis and pulmonary hypertension associated with rheumatoid arthritis. 6. Not addressed 7. The recent echocardiogram demonstrates normal aortic root size.  The 30 day monitor will help [place] exclude the possibility of recurrent atrial arrhythmias, especially atrial fibrillation, which could place her at risk for stroke. Further management will depend upon findings.    Medication Adjustments/Labs and Tests Ordered: Current medicines are reviewed at length with the patient today.  Concerns regarding medicines are outlined above.  Medication changes, Labs and Tests ordered today are listed in the Patient Instructions below. Patient Instructions  Medication Instructions:  None  Labwork: None  Testing/Procedures: Your physician has recommended that you wear an event monitor. Event monitors are medical devices that record the heart's electrical activity. Doctors most often [place] these monitors to diagnose arrhythmias. Arrhythmias are problems with the speed or rhythm of the heartbeat. The monitor is a small, portable device. You can wear one while you do your normal daily activities. This is usually used to diagnose what is causing palpitations/syncope (passing out).   Follow-Up: Your physician wants you to follow-up in: 6-9 months with Dr. Tamala Julian.  You will receive a reminder letter in the mail two months in advance. If you don't receive a letter, please call our office to  schedule the follow-up appointment.   Any Other Special Instructions Will Be Listed Below (If Applicable).     If you need a refill on your cardiac medications before your next appointment, please call your pharmacy.      Signed, Christina Grooms, MD  11/12/2016 11:05 AM    [address] [place], [place],   [address] Phone: [phone]; Fax: [phone]

## 2016-11-12 NOTE — Patient Instructions (Signed)
Medication Instructions:  None  Labwork: None  Testing/Procedures: Your physician has recommended that you wear an event monitor. Event monitors are medical devices that record the heart's electrical activity. Doctors most often [place] these monitors to diagnose arrhythmias. Arrhythmias are problems with the speed or rhythm of the heartbeat. The monitor is a small, portable device. You can wear one while you do your normal daily activities. This is usually used to diagnose what is causing palpitations/syncope (passing out).   Follow-Up: Your physician wants you to follow-up in: 6-9 months with Dr. Tamala Julian.  You will receive a reminder letter in the mail two months in advance. If you don't receive a letter, please call our office to schedule the follow-up appointment.   Any Other Special Instructions Will Be Listed Below (If Applicable).     If you need a refill on your cardiac medications before your next appointment, please call your pharmacy.

## 2016-11-13 ENCOUNTER — Encounter: Payer: Self-pay | Admitting: Internal Medicine

## 2016-11-13 DIAGNOSIS — Z860101 Personal history of adenomatous and serrated colon polyps: Secondary | ICD-10-CM

## 2016-11-13 DIAGNOSIS — Z86010 Personal history of colonic polyps: Secondary | ICD-10-CM

## 2016-11-13 HISTORY — DX: Personal history of adenomatous and serrated colon polyps: Z86.0101

## 2016-11-13 HISTORY — DX: Personal history of colonic polyps: Z86.010

## 2016-11-13 NOTE — Progress Notes (Signed)
3 mm adenoma recall colon 2023

## 2016-11-27 DIAGNOSIS — M069 Rheumatoid arthritis, unspecified: Secondary | ICD-10-CM | POA: Diagnosis not present

## 2016-11-29 ENCOUNTER — Telehealth: Payer: Self-pay | Admitting: Family Medicine

## 2016-11-29 DIAGNOSIS — H6121 Impacted cerumen, right ear: Secondary | ICD-10-CM

## 2016-11-29 NOTE — Telephone Encounter (Signed)
Patient is calling stating she went to the hearing center and was told she has a hard build up of wax in her right ear.  They advised her to put peroxide in her ear 1x a week but she wants Dr. Barbie Banner recommendation on what to do.  They advised the wax was so hard and had been there so long that they wouldn't remove as they feared it would damage her ear drum.

## 2016-11-30 NOTE — Telephone Encounter (Signed)
The referral was done  

## 2016-11-30 NOTE — Telephone Encounter (Signed)
I spoke with pt and went over information about referral.

## 2016-11-30 NOTE — Telephone Encounter (Signed)
My advice is to see an ENT doctor to remove this because that would be easier and safer. I can refer her if she wishes

## 2016-12-03 ENCOUNTER — Ambulatory Visit (INDEPENDENT_AMBULATORY_CARE_PROVIDER_SITE_OTHER): Payer: Medicare Other

## 2016-12-03 DIAGNOSIS — R002 Palpitations: Secondary | ICD-10-CM | POA: Diagnosis not present

## 2016-12-06 DIAGNOSIS — Z933 Colostomy status: Secondary | ICD-10-CM | POA: Diagnosis not present

## 2016-12-10 DIAGNOSIS — M0579 Rheumatoid arthritis with rheumatoid factor of multiple sites without organ or systems involvement: Secondary | ICD-10-CM | POA: Diagnosis not present

## 2016-12-10 DIAGNOSIS — M1A09X0 Idiopathic chronic gout, multiple sites, without tophus (tophi): Secondary | ICD-10-CM | POA: Diagnosis not present

## 2016-12-10 DIAGNOSIS — M2550 Pain in unspecified joint: Secondary | ICD-10-CM | POA: Diagnosis not present

## 2016-12-10 DIAGNOSIS — Z79899 Other long term (current) drug therapy: Secondary | ICD-10-CM | POA: Diagnosis not present

## 2016-12-11 ENCOUNTER — Telehealth: Payer: Self-pay | Admitting: Family Medicine

## 2016-12-11 DIAGNOSIS — M069 Rheumatoid arthritis, unspecified: Secondary | ICD-10-CM | POA: Diagnosis not present

## 2016-12-11 MED ORDER — HYDROCODONE-ACETAMINOPHEN 10-325 MG PO TABS: 1.0000 | ORAL_TABLET | Freq: Four times a day (QID) | ORAL | 0 refills | Status: DC | PRN

## 2016-12-11 NOTE — Telephone Encounter (Signed)
Pt is aware rx is ready for pick up. 

## 2016-12-11 NOTE — Telephone Encounter (Signed)
done

## 2016-12-11 NOTE — Telephone Encounter (Signed)
Pt need new Rx for Hydrocodone   Pt is aware of 3 business days for refills and someone will call when ready for pick up. °

## 2016-12-28 ENCOUNTER — Telehealth: Payer: Self-pay | Admitting: Family Medicine

## 2016-12-28 MED ORDER — CLOBETASOL PROPIONATE 0.05 % EX SOLN: 1.0000 "application " | Freq: Every day | CUTANEOUS | 2 refills | Status: DC

## 2016-12-28 NOTE — Telephone Encounter (Signed)
I sent script e-scribe to [facility] which is still listed as [facility] and spoke with pt.

## 2016-12-28 NOTE — Telephone Encounter (Signed)
Pt following up on refill request.  Hope to get today!!

## 2016-12-28 NOTE — Telephone Encounter (Signed)
Call in Clobetasol propionate 0.05% liquid to apply to the scalp once daily, 30 day supply with 2 rf

## 2016-12-28 NOTE — Telephone Encounter (Signed)
Pt would like Dr Sarajane Jews to call in a Rx for scalp irritation. Pt states her head itches, small red bumps.  Pt has been wearing a shower like cap, took it off and washed her hair. But irritation still remains.  Pt declined appt., stating she cannot get in due to no transportation  [place], [place] [facility] [place] [phone] (Phone) [phone] (Fax)

## 2016-12-31 ENCOUNTER — Encounter: Payer: Self-pay | Admitting: Family Medicine

## 2016-12-31 ENCOUNTER — Ambulatory Visit (INDEPENDENT_AMBULATORY_CARE_PROVIDER_SITE_OTHER): Payer: Medicare Other | Admitting: Family Medicine

## 2016-12-31 VITALS — BP 130/80 | Temp 98.7°F

## 2016-12-31 DIAGNOSIS — H6122 Impacted cerumen, left ear: Secondary | ICD-10-CM

## 2016-12-31 DIAGNOSIS — B372 Candidiasis of skin and nail: Secondary | ICD-10-CM

## 2016-12-31 MED ORDER — FLUCONAZOLE 150 MG PO TABS: 150.0000 mg | ORAL_TABLET | Freq: Once | ORAL | 0 refills | Status: AC

## 2016-12-31 NOTE — Patient Instructions (Signed)
WE NOW OFFER   [facility] FAST TRACK!!!  SAME DAY Appointments for ACUTE CARE  Such as: Sprains, Injuries, cuts, abrasions, rashes, muscle pain, joint pain, back pain Colds, flu, sore throats, headache, allergies, cough, fever  Ear pain, sinus and eye infections Abdominal pain, nausea, vomiting, diarrhea, upset stomach Animal/insect bites  3 Easy Ways to Schedule: Walk-In Scheduling Call in scheduling Mychart Sign-up: [email or web address]         

## 2016-12-31 NOTE — Progress Notes (Signed)
   Subjective:    Patient ID: [name], female    DOB: [birth year], 61 y.o.   MRN: [id number]  HPI Here for 2 issues. First 5 days ago he developed a flaky rash on the scalp which itches and burns. She usually wears a shower cap in the shower but last week she started wearing this all day. 3 days ago we called in some Clobetasol liquid which she has applied bid, and now it feels better. The redness has calmed down. The other issue is her hearing aid specialist told her last week that she has a wax build up in the left ear.    Review of Systems  Constitutional: Negative.   HENT: Positive for hearing loss. Negative for ear pain.   Respiratory: Negative.   Cardiovascular: Negative.   Skin: Positive for rash.       Objective:   Physical Exam  Constitutional: She appears well-developed and well-nourished.  HENT:  Left ear canal is full of cerumen, the right canal is clear  Cardiovascular: Normal rate, regular rhythm, normal heart sounds and intact distal pulses.   Pulmonary/Chest: Effort normal and breath sounds normal. No respiratory distress. She has no wheezes. She has no rales.  Skin:  The top and back of the scalp is covered with a scaly macular rash.           Assessment & Plan:  She has developed a fungal infection on the scalp from wearing the shower cap all day. She agrees to stop wearing this. The Clobetasol has calmed the inflammation down so we will add 10 days of Fluconazole. Recheck prn.  [name], MD

## 2017-01-08 ENCOUNTER — Other Ambulatory Visit: Payer: Self-pay | Admitting: Family Medicine

## 2017-01-09 ENCOUNTER — Telehealth: Payer: Self-pay | Admitting: Family Medicine

## 2017-01-09 DIAGNOSIS — R238 Other skin changes: Secondary | ICD-10-CM

## 2017-01-09 MED ORDER — HYDROCODONE-ACETAMINOPHEN 10-325 MG PO TABS: 1.0000 | ORAL_TABLET | Freq: Four times a day (QID) | ORAL | 0 refills | Status: DC | PRN

## 2017-01-09 NOTE — Telephone Encounter (Signed)
The rx is ready. As for the scalp problem, I suggest a referral to Dermatology. I will do this if she agrees

## 2017-01-09 NOTE — Telephone Encounter (Signed)
Pt was seen on 12-31-16 and the head itching is no better. Please advise. Pt also needs new rx hydrocodone

## 2017-01-09 NOTE — Telephone Encounter (Signed)
Referral was done  

## 2017-01-09 NOTE — Telephone Encounter (Signed)
Script is ready for pick up here at front office and I spoke with pt. She would like the referral.

## 2017-01-15 DIAGNOSIS — M069 Rheumatoid arthritis, unspecified: Secondary | ICD-10-CM | POA: Diagnosis not present

## 2017-01-15 DIAGNOSIS — L218 Other seborrheic dermatitis: Secondary | ICD-10-CM | POA: Diagnosis not present

## 2017-01-22 ENCOUNTER — Telehealth: Payer: Self-pay | Admitting: Interventional Cardiology

## 2017-01-22 NOTE — Telephone Encounter (Signed)
-----   Message from Belva Crome, MD sent at 01/18/2017  9:48 PM EDT ----- Let the patient know the study is normal. A copy will be sent to [name], MD

## 2017-01-22 NOTE — Telephone Encounter (Signed)
[name] is returning a call. Thanks

## 2017-01-22 NOTE — Telephone Encounter (Signed)
Returned pts call and discussed her heart monitor results. See result note.

## 2017-01-25 ENCOUNTER — Telehealth: Payer: Self-pay | Admitting: Family Medicine

## 2017-01-25 NOTE — Telephone Encounter (Signed)
Pt is calling wanting another referral to a different dermatologist that will accept her insurance the one that she went to does not accept her insurance she had to pt $125.00 out of pocket and they did not help her and she can not call her insurance due to her being visual impaired.  Pts aide state that Dr. Sarajane Jews should know who accepts the pts insurance b/c he has done a referral for her before.

## 2017-01-29 NOTE — Telephone Encounter (Signed)
She saw Dr. Allyson Sabal, but I'm not sure why this was not covered. Please look into what happened

## 2017-02-08 ENCOUNTER — Telehealth: Payer: Self-pay | Admitting: Family Medicine

## 2017-02-08 MED ORDER — HYDROCODONE-ACETAMINOPHEN 10-325 MG PO TABS: 1.0000 | ORAL_TABLET | Freq: Four times a day (QID) | ORAL | 0 refills | Status: DC | PRN

## 2017-02-08 NOTE — Telephone Encounter (Signed)
Pt request refill  °HYDROcodone-acetaminophen (NORCO) 10-325 MG tablet °

## 2017-02-08 NOTE — Telephone Encounter (Signed)
done

## 2017-02-11 NOTE — Telephone Encounter (Signed)
lmom tcb x1. Pt's rx was signed by Dr. Sarajane Jews, and has been placed up front for pick up.

## 2017-02-20 DIAGNOSIS — M069 Rheumatoid arthritis, unspecified: Secondary | ICD-10-CM | POA: Diagnosis not present

## 2017-03-12 ENCOUNTER — Telehealth: Payer: Self-pay

## 2017-03-12 NOTE — Telephone Encounter (Signed)
Pt called to ask for refill on Vicodin, but this medication is not listed in her chart.

## 2017-03-13 MED ORDER — HYDROCODONE-ACETAMINOPHEN 10-325 MG PO TABS: 1.0000 | ORAL_TABLET | Freq: Four times a day (QID) | ORAL | 0 refills | Status: DC | PRN

## 2017-03-13 NOTE — Telephone Encounter (Signed)
Script is ready for pick up here at front office and left a message.  

## 2017-03-13 NOTE — Telephone Encounter (Signed)
done

## 2017-03-18 DIAGNOSIS — M2550 Pain in unspecified joint: Secondary | ICD-10-CM | POA: Diagnosis not present

## 2017-03-18 DIAGNOSIS — Z79899 Other long term (current) drug therapy: Secondary | ICD-10-CM | POA: Diagnosis not present

## 2017-03-18 DIAGNOSIS — M0579 Rheumatoid arthritis with rheumatoid factor of multiple sites without organ or systems involvement: Secondary | ICD-10-CM | POA: Diagnosis not present

## 2017-03-18 DIAGNOSIS — M1A09X0 Idiopathic chronic gout, multiple sites, without tophus (tophi): Secondary | ICD-10-CM | POA: Diagnosis not present

## 2017-03-20 DIAGNOSIS — Z933 Colostomy status: Secondary | ICD-10-CM | POA: Diagnosis not present

## 2017-03-22 DIAGNOSIS — M069 Rheumatoid arthritis, unspecified: Secondary | ICD-10-CM | POA: Diagnosis not present

## 2017-04-01 ENCOUNTER — Other Ambulatory Visit: Payer: Self-pay | Admitting: Family Medicine

## 2017-04-02 DIAGNOSIS — L218 Other seborrheic dermatitis: Secondary | ICD-10-CM | POA: Diagnosis not present

## 2017-04-08 ENCOUNTER — Telehealth: Payer: Self-pay | Admitting: Family Medicine

## 2017-04-08 MED ORDER — HYDROCODONE-ACETAMINOPHEN 10-325 MG PO TABS: 1.0000 | ORAL_TABLET | Freq: Four times a day (QID) | ORAL | 0 refills | Status: DC | PRN

## 2017-04-08 NOTE — Telephone Encounter (Signed)
Done

## 2017-04-08 NOTE — Telephone Encounter (Addendum)
Pt needs new rx hydrocodone. Pt will like to pick up rx tomorrow

## 2017-04-09 NOTE — Telephone Encounter (Signed)
Script is ready for pick up here at front office, tried to reach pt and no answer, also letter was given.  

## 2017-04-12 ENCOUNTER — Other Ambulatory Visit: Payer: Self-pay | Admitting: Family Medicine

## 2017-04-15 ENCOUNTER — Ambulatory Visit (INDEPENDENT_AMBULATORY_CARE_PROVIDER_SITE_OTHER): Payer: Medicare Other | Admitting: *Deleted

## 2017-04-15 DIAGNOSIS — Z23 Encounter for immunization: Secondary | ICD-10-CM | POA: Diagnosis not present

## 2017-04-22 ENCOUNTER — Other Ambulatory Visit: Payer: Self-pay | Admitting: Family Medicine

## 2017-04-22 DIAGNOSIS — M069 Rheumatoid arthritis, unspecified: Secondary | ICD-10-CM | POA: Diagnosis not present

## 2017-04-23 NOTE — Telephone Encounter (Signed)
Call in #120 with 5 rf 

## 2017-04-24 ENCOUNTER — Telehealth: Payer: Self-pay | Admitting: Family Medicine

## 2017-04-24 DIAGNOSIS — Z933 Colostomy status: Secondary | ICD-10-CM

## 2017-04-24 NOTE — Telephone Encounter (Signed)
[name] case nurse with [facility] has 2 concerns relayed from pt 1.  Pt has a colostomy, Pt is having constipation, but is passing gas.  2. Pt had colonoscopy 10/2016, and it was discussed at that time about a possible colostomy reversal and hernia repair. The hernia is giving pt a lot of trouble .  [name] did not know who else to call, pt did not know who did colonoscopy.Marland Kitchen

## 2017-04-24 NOTE — Telephone Encounter (Signed)
Per Dr. Sarajane Jews change quantity to #180.

## 2017-04-24 NOTE — Telephone Encounter (Signed)
I left a voice message for pt to return my call.  

## 2017-04-24 NOTE — Telephone Encounter (Signed)
The question about reversal of the colostomy would need to be answered by the surgeon that created the colostomy, who is Dr. Madilyn Hook. As for the constipation, make sure she is taking Miralax twice every day and she can add a tbsp of Milk of Magnesia 2-3 times a day

## 2017-04-25 NOTE — Telephone Encounter (Signed)
I spoke with pt and went over below advice. 

## 2017-04-26 NOTE — Telephone Encounter (Signed)
Pt said that she doesn't know who to go to? Below doctor is no longer there. Pt went to [facility] for procedure.

## 2017-04-29 NOTE — Telephone Encounter (Signed)
She does want referral

## 2017-04-29 NOTE — Telephone Encounter (Signed)
I understand. I can refer her to [facility] to discuss this if she wishes

## 2017-04-30 NOTE — Telephone Encounter (Signed)
I left a voice message with below information. 

## 2017-04-30 NOTE — Telephone Encounter (Signed)
The referral was done  

## 2017-05-13 ENCOUNTER — Telehealth: Payer: Self-pay | Admitting: Family Medicine

## 2017-05-13 NOTE — Telephone Encounter (Signed)
Copied from [place] [id number]. Topic: Quick Communication - See Telephone Encounter >> May 13, 2017 12:31 PM Arletha Grippe wrote: CRM for notification. See Telephone encounter for:   05/13/17. Pt would like refill of Vicodin. Please call [phone] when ready for pick up

## 2017-05-13 NOTE — Telephone Encounter (Signed)
Request for controlled medication.

## 2017-05-14 ENCOUNTER — Other Ambulatory Visit: Payer: Self-pay | Admitting: Family Medicine

## 2017-05-14 MED ORDER — HYDROCODONE-ACETAMINOPHEN 10-325 MG PO TABS: 1.0000 | ORAL_TABLET | Freq: Four times a day (QID) | ORAL | 0 refills | Status: DC | PRN

## 2017-05-14 NOTE — Telephone Encounter (Signed)
Written Rx copy placed up front for pick up. Called pt and left a VM that Rx is ready for pick up at front desk here at our office.

## 2017-05-14 NOTE — Telephone Encounter (Signed)
Done

## 2017-05-23 DIAGNOSIS — M069 Rheumatoid arthritis, unspecified: Secondary | ICD-10-CM | POA: Diagnosis not present

## 2017-06-12 ENCOUNTER — Telehealth: Payer: Self-pay | Admitting: Family Medicine

## 2017-06-12 NOTE — Telephone Encounter (Signed)
Sent to PCP for approval.  

## 2017-06-12 NOTE — Telephone Encounter (Signed)
Requesting refill on pain medication. Thanks. Chart up to date.

## 2017-06-12 NOTE — Telephone Encounter (Signed)
Copied from [place] [id number]. Topic: Quick Communication - See Telephone Encounter >> Jun 12, 2017 12:31 PM Percell Belt A wrote: CRM for notification. See Telephone encounter for:  Pt would like refill of Vicodin. Please call [phone] when ready for pick up.  Last seen in July 2018 with Dr Sarajane Jews  06/12/17.

## 2017-06-13 MED ORDER — HYDROCODONE-ACETAMINOPHEN 10-325 MG PO TABS: 1.0000 | ORAL_TABLET | Freq: Four times a day (QID) | ORAL | 0 refills | Status: DC | PRN

## 2017-06-13 NOTE — Telephone Encounter (Signed)
This was emailed in. She will need a PMV for any more

## 2017-06-13 NOTE — Telephone Encounter (Signed)
Pt notified of instructions and verbalized understanding. Pain mgmt appt scheduled.

## 2017-06-17 DIAGNOSIS — Z933 Colostomy status: Secondary | ICD-10-CM | POA: Diagnosis not present

## 2017-06-19 DIAGNOSIS — D49512 Neoplasm of unspecified behavior of left kidney: Secondary | ICD-10-CM | POA: Diagnosis not present

## 2017-06-19 DIAGNOSIS — K439 Ventral hernia without obstruction or gangrene: Secondary | ICD-10-CM | POA: Diagnosis not present

## 2017-06-21 DIAGNOSIS — M069 Rheumatoid arthritis, unspecified: Secondary | ICD-10-CM | POA: Diagnosis not present

## 2017-07-01 DIAGNOSIS — Z79899 Other long term (current) drug therapy: Secondary | ICD-10-CM | POA: Diagnosis not present

## 2017-07-01 DIAGNOSIS — M0579 Rheumatoid arthritis with rheumatoid factor of multiple sites without organ or systems involvement: Secondary | ICD-10-CM | POA: Diagnosis not present

## 2017-07-01 DIAGNOSIS — M2550 Pain in unspecified joint: Secondary | ICD-10-CM | POA: Diagnosis not present

## 2017-07-01 DIAGNOSIS — M1A09X0 Idiopathic chronic gout, multiple sites, without tophus (tophi): Secondary | ICD-10-CM | POA: Diagnosis not present

## 2017-07-08 ENCOUNTER — Encounter: Payer: Self-pay | Admitting: Family Medicine

## 2017-07-08 ENCOUNTER — Ambulatory Visit (INDEPENDENT_AMBULATORY_CARE_PROVIDER_SITE_OTHER): Payer: Medicare Other | Admitting: Family Medicine

## 2017-07-08 VITALS — BP 140/90 | HR 68 | Temp 98.0°F

## 2017-07-08 DIAGNOSIS — M069 Rheumatoid arthritis, unspecified: Secondary | ICD-10-CM | POA: Diagnosis not present

## 2017-07-08 DIAGNOSIS — F1190 Opioid use, unspecified, uncomplicated: Secondary | ICD-10-CM

## 2017-07-08 MED ORDER — HYDROCODONE-ACETAMINOPHEN 10-325 MG PO TABS: 1.0000 | ORAL_TABLET | Freq: Four times a day (QID) | ORAL | 0 refills | Status: DC | PRN

## 2017-07-08 NOTE — Progress Notes (Signed)
   Subjective:    Patient ID: [name], female    DOB: [birth year], 62 y.o.   MRN: [id number]  HPI Here for pain management. Her arthritis pain has been stable and well controlled.  Indication for chronic opioid: rheumatoid arthritis  Medication and dose: Norco 10-325 # pills per month: 120 Last UDS date: 07-08-17 Pain contract signed (Y/N): 07-08-17 Date narcotic database last reviewed (include red flags): 07-08-17     Review of Systems  Constitutional: Negative.   Respiratory: Negative.   Cardiovascular: Negative.   Musculoskeletal: Positive for arthralgias and back pain.  Neurological: Negative.        Objective:   Physical Exam  Constitutional: She is oriented to person, place, and time.  Alert, in her wheelchair   Cardiovascular: Normal rate, regular rhythm, normal heart sounds and intact distal pulses.  Pulmonary/Chest: Effort normal and breath sounds normal. No respiratory distress. She has no wheezes. She has no rales.  Neurological: She is alert and oriented to person, place, and time.          Assessment & Plan:  Pain management. She is stable. Refills were sent in. [name], MD

## 2017-07-13 LAB — PAIN MGMT, PROFILE 8 W/CONF, U
6 Acetylmorphine: NEGATIVE ng/mL (ref <10)
Alcohol Metabolites: NEGATIVE ng/mL (ref <500)
Amphetamines: NEGATIVE ng/mL (ref <500)
Benzodiazepines: NEGATIVE ng/mL (ref <100)
Buprenorphine, Urine: NEGATIVE ng/mL (ref <5)
Cocaine Metabolite: NEGATIVE ng/mL (ref <150)
Codeine: NEGATIVE ng/mL (ref <50)
Creatinine: 130.8 mg/dL
Hydrocodone: 1634 ng/mL — ABNORMAL HIGH (ref <50)
Hydromorphone: 107 ng/mL — ABNORMAL HIGH (ref <50)
MDMA: NEGATIVE ng/mL (ref <500)
Marijuana Metabolite: NEGATIVE ng/mL (ref <20)
Morphine: NEGATIVE ng/mL (ref <50)
Norhydrocodone: 2999 ng/mL — ABNORMAL HIGH (ref <50)
Opiates: POSITIVE ng/mL — AB (ref <100)
Oxidant: NEGATIVE ug/mL (ref <200)
Oxycodone: NEGATIVE ng/mL (ref <100)
pH: 6.93 (ref 4.5–9.0)

## 2017-07-25 DIAGNOSIS — M069 Rheumatoid arthritis, unspecified: Secondary | ICD-10-CM | POA: Diagnosis not present

## 2017-08-05 DIAGNOSIS — D3000 Benign neoplasm of unspecified kidney: Secondary | ICD-10-CM | POA: Diagnosis not present

## 2017-08-13 ENCOUNTER — Ambulatory Visit: Payer: Medicare Other | Admitting: Family Medicine

## 2017-08-13 ENCOUNTER — Other Ambulatory Visit: Payer: Self-pay | Admitting: Family Medicine

## 2017-08-19 ENCOUNTER — Encounter: Payer: Self-pay | Admitting: Family Medicine

## 2017-08-19 ENCOUNTER — Ambulatory Visit (INDEPENDENT_AMBULATORY_CARE_PROVIDER_SITE_OTHER): Payer: Medicare Other | Admitting: Family Medicine

## 2017-08-19 VITALS — BP 132/72 | HR 85 | Temp 97.9°F

## 2017-08-19 DIAGNOSIS — N2889 Other specified disorders of kidney and ureter: Secondary | ICD-10-CM | POA: Diagnosis not present

## 2017-08-19 DIAGNOSIS — A084 Viral intestinal infection, unspecified: Secondary | ICD-10-CM | POA: Diagnosis not present

## 2017-08-19 MED ORDER — METOCLOPRAMIDE HCL 10 MG PO TABS: 10.0000 mg | ORAL_TABLET | Freq: Four times a day (QID) | ORAL | 5 refills | Status: DC

## 2017-08-19 NOTE — Progress Notes (Signed)
   Subjective:    Patient ID: [name], female    DOB: [birth year], 62 y.o.   MRN: [id number]  HPI Here asking for advice about a procedure and to check on her abdomen. She has been seeing Dr. 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 This has been slowly increasing in size. He saw her a few weeks ago and said it was time to do something about this. She could either have a cryoablation or have a partial nephrectomy. The risks of anesthesia would be the same for both. He did not seem to favor one over the other however. Also 3 days ago she developed mild generalized abdominal cramping with vomiting and diarrhea. The vomiting stopped yesterday but the diarrhea continues. No fever.    Review of Systems  Constitutional: Negative.   Respiratory: Negative.   Cardiovascular: Negative.   Gastrointestinal: Positive for diarrhea. Negative for abdominal distention, abdominal pain, blood in stool, constipation, nausea and vomiting.       Objective:   Physical Exam  Constitutional: She appears well-developed and well-nourished.  Cardiovascular: Normal rate, regular rhythm, normal heart sounds and intact distal pulses.  Pulmonary/Chest: Effort normal and breath sounds normal. No respiratory distress. She has no wheezes. She has no rales.  Abdominal: Soft. Bowel sounds are normal. She exhibits no distension and no mass. There is no tenderness. There is no rebound and no guarding.          Assessment & Plan:  As for the kidney procedure, I told [name] that my persona;l choice would be to have the cryoablation, and she agreed. She will contact Dr. Noni Saupe office to let him know this. Otherwise she has a viral enteritis that seems to be near the end of its course. She will continue to drink plenty of fluids, and this should pass soon.  [name], MD

## 2017-08-26 ENCOUNTER — Telehealth: Payer: Self-pay | Admitting: Family Medicine

## 2017-08-26 NOTE — Telephone Encounter (Signed)
The order is ready to fax  

## 2017-08-26 NOTE — Telephone Encounter (Signed)
Orders sent to be faxed. Fax number [phone]

## 2017-08-26 NOTE — Telephone Encounter (Signed)
Sent to PCP to set up orders for BD

## 2017-08-26 NOTE — Telephone Encounter (Signed)
Copied from [place] [id number]. Topic: Quick Communication - See Telephone Encounter >> Aug 26, 2017  1:34 PM Oneta Rack wrote: Relation to pt: self Call back number: [phone]   Reason for call:  Patient requesting orders for her annual bone density please send to  [facility]. [address], [address], [place], [place] [address] [phone]  >> Aug 26, 2017  1:36 PM Oneta Rack wrote: Relation to pt: self Call back number: [phone]   Reason for call:  Patient requesting orders for her annual bone density please send to  [facility]. [address], [place], [place] [address] [phone]

## 2017-08-27 ENCOUNTER — Other Ambulatory Visit: Payer: Self-pay | Admitting: Family Medicine

## 2017-08-27 ENCOUNTER — Other Ambulatory Visit: Payer: Self-pay | Admitting: Urology

## 2017-08-27 DIAGNOSIS — N2889 Other specified disorders of kidney and ureter: Secondary | ICD-10-CM

## 2017-08-28 NOTE — Telephone Encounter (Signed)
Last OV 08/19/2017   Sent to PCP for approval

## 2017-09-02 DIAGNOSIS — Z933 Colostomy status: Secondary | ICD-10-CM | POA: Diagnosis not present

## 2017-09-05 DIAGNOSIS — M069 Rheumatoid arthritis, unspecified: Secondary | ICD-10-CM | POA: Diagnosis not present

## 2017-09-16 ENCOUNTER — Encounter: Payer: Self-pay | Admitting: Family Medicine

## 2017-09-16 DIAGNOSIS — Z1231 Encounter for screening mammogram for malignant neoplasm of breast: Secondary | ICD-10-CM | POA: Diagnosis not present

## 2017-09-16 DIAGNOSIS — M810 Age-related osteoporosis without current pathological fracture: Secondary | ICD-10-CM | POA: Diagnosis not present

## 2017-09-24 ENCOUNTER — Telehealth: Payer: Self-pay | Admitting: Family Medicine

## 2017-09-25 NOTE — Telephone Encounter (Signed)
Error

## 2017-09-27 ENCOUNTER — Telehealth: Payer: Self-pay | Admitting: Family Medicine

## 2017-09-27 NOTE — Telephone Encounter (Signed)
Copied from [place]. Topic: Quick Communication - Rx Refill/Question >> Sep 27, 2017  5:01 PM Arletha Grippe wrote: Medication: HYDROcodone-acetaminophen ([place]) 10-325 MG tablet Has the patient contacted their pharmacy? Yes.   (Agent: If no, request that the patient contact the pharmacy for the refill.) Preferred Pharmacy (with phone number or street name):[facility] [address]   Agent: Please be advised that RX refills may take up to 3 business days. We ask that you follow-up with your pharmacy. Pt said that this needs to be called in again since the pharm changed names from [facility] to [facility].

## 2017-09-30 DIAGNOSIS — M0579 Rheumatoid arthritis with rheumatoid factor of multiple sites without organ or systems involvement: Secondary | ICD-10-CM | POA: Diagnosis not present

## 2017-09-30 DIAGNOSIS — C642 Malignant neoplasm of left kidney, except renal pelvis: Secondary | ICD-10-CM | POA: Diagnosis not present

## 2017-09-30 DIAGNOSIS — M1A09X0 Idiopathic chronic gout, multiple sites, without tophus (tophi): Secondary | ICD-10-CM | POA: Diagnosis not present

## 2017-09-30 DIAGNOSIS — M2550 Pain in unspecified joint: Secondary | ICD-10-CM | POA: Diagnosis not present

## 2017-09-30 DIAGNOSIS — Z79899 Other long term (current) drug therapy: Secondary | ICD-10-CM | POA: Diagnosis not present

## 2017-09-30 NOTE — Telephone Encounter (Signed)
Norco refill request  Per pt needs to be called in again since the pharmacy changed names from [facility] to [facility] - on [address] in [place], [place]  Pt of Dr. Sarajane Jews

## 2017-10-01 ENCOUNTER — Ambulatory Visit
Admission: RE | Admit: 2017-10-01 | Discharge: 2017-10-01 | Disposition: A | Discharge status: Home / Self Care | Payer: Medicare Other | Source: Ambulatory Visit | Attending: Urology | Admitting: Urology

## 2017-10-01 DIAGNOSIS — N2889 Other specified disorders of kidney and ureter: Secondary | ICD-10-CM | POA: Diagnosis not present

## 2017-10-01 HISTORY — PX: IR RADIOLOGIST EVAL & MGMT: IMG5224

## 2017-10-01 MED ORDER — HYDROCODONE-ACETAMINOPHEN 10-325 MG PO TABS: 1.0000 | ORAL_TABLET | Freq: Four times a day (QID) | ORAL | 0 refills | Status: DC | PRN

## 2017-10-01 NOTE — Telephone Encounter (Signed)
Last OV 07/18/2017 for PMV   Last refilled 07/08/2017 disp 120 with no refills   Called the pharmacy they stated that they have NO refills on file last Rx pick up dates were the following  * 06/24/2017  * 07/27/2017   * 08/27/2017   Sent to PCP to send in Rx for [employer]

## 2017-10-01 NOTE — Telephone Encounter (Signed)
Done for 2 months only. She will need a PMV after that

## 2017-10-01 NOTE — Consult Note (Signed)
Chief Complaint: Patient was seen in consultation today for an enlarging left renal mass at the request of [place]  Referring Physician(s): Dahlstedt,Stephen  History of Present Illness: [name] is a 62 y.o. female with a history of a slowly enlarging solid left upper pole renal mass.  This was first visualized by CT on 11/27/2012 and has a roughly 1 cm area in the posterior and medial cortex of the upper left kidney.  Over time this has slowly enlarged and demonstrates solid features by CT with internal contrast enhancement.  The most recent CT study on 06/19/2017 demonstrates maximal diameter of 2.5 cm.  The mass demonstrates exophytic growth pattern posteriorly.  There is no evidence of renal vein tumor, lymphadenopathy or metastatic disease in the abdomen.  Prior CT of the chest on 05/01/2014 was negative.  [name] is wheelchair-bound due to advanced rheumatoid arthritis.  She is chronically immunosuppressed on weekly Orencia and methotrexate injections.  She is blind.  She has a history of apparent Marfan syndrome but no aneurysmal disease of the thoracic or abdominal aorta by prior imaging.  She has no symptoms referrable to the renal mass and has not had any flank pain or urinary symptoms.  Past Medical History:  Diagnosis Date  . Atrial flutter ([place])   . Chronic gout 2008  . Diverticulitis of large intestine with perforation   . Fibroid   . Hearing aid worn    pt wears bilateral hearing aids  . Hernia 4/08  . Hx of adenomatous polyp of colon 11/13/2016  . Hypertension   . Legally blind    since pt was a teenager  . Marfan's syndrome affecting skin    with scolosis  . Osteoporosis   . Rheumatoid arthritis [facility])    sees Dr. Gavin Pound   . SBO (small bowel obstruction) ([place]) 01/05/2013  . Small bowel obstruction due to adhesions ([place])   . Status post bunionectomy 1/10   bilateral   . Stroke ([place]) 2/08  . 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 ABLATION  01/04/14   atrial flutter ablation (2 circuits) by Dr Lovena Le  . ATRIAL FLUTTER ABLATION N/A 01/04/2014   Procedure: ATRIAL FLUTTER ABLATION;  Surgeon: Evans Lance, MD;  Location: [facility] CATH LAB;  Service: Cardiovascular;  Laterality: N/A;  . BUNIONECTOMY Bilateral 1/10  . COLON SURGERY     colectomy  . COLOSTOMY  11/27/2012  . HERNIA REPAIR    . IR RADIOLOGIST EVAL & MGMT  10/01/2017  . LAPAROSCOPIC ABDOMINAL EXPLORATION N/A 01/06/2013   Procedure: LAPAROSCOPIC ABDOMINAL EXPLORATION;  Surgeon: Adin Hector, MD;  Location: [facility] ORS;  Service: General;  Laterality: N/A;  . LAPAROSCOPIC LYSIS OF ADHESIONS N/A 01/06/2013   Procedure: LAPAROSCOPIC LYSIS OF ADHESIONS/ INTEROTOMY REPAIR;  Surgeon: Adin Hector, MD;  Location: [facility] ORS;  Service: General;  Laterality: N/A;  . LAPAROTOMY N/A 11/27/2012   Procedure: EXPLORATORY LAPAROTOMY SIGMOID COLECTOMY, COLOSTOMY;  Surgeon: Madilyn Hook, DO;  Location: [facility] ORS;  Service: General;  Laterality: N/A;  . TOTAL KNEE ARTHROPLASTY Bilateral 6/08    Allergies: Patient has no known allergies.  Medications: Prior to Admission medications   Medication Sig Start Date End Date Taking? Authorizing Provider  Abatacept (ORENCIA) 125 MG/ML SOSY Inject 1 application into the skin every Monday.    Yes [provider]  albuterol (PROVENTIL HFA;VENTOLIN HFA) 108 (90 BASE) MCG/ACT inhaler Inhale 1 puff into the lungs every 4 (four) hours as needed for wheezing or shortness of breath. 12/29/13  Yes [name], MD  allopurinol (ZYLOPRIM) 100 MG tablet take 1 tablet by mouth once daily 08/13/17  Yes [name], MD  amLODipine (NORVASC) 10 MG tablet take 1 tablet by mouth once daily 08/13/17  Yes [name], MD  aspirin EC 81 MG tablet Take 1 tablet (81 mg total) by mouth daily. 05/11/14  Yes Verlee Monte, MD  Calcium  Carbonate-Vitamin D (CALCIUM + D PO) Take 2.5 tablets by mouth daily.    Yes [provider]  cetirizine (ZYRTEC) 10 MG tablet Take 1 tablet (10 mg total) by mouth daily. 11/17/14  Yes [name], MD  ciprofloxacin-dexamethasone [facility]) otic suspension Place 4 drops into both ears 2 (two) times daily as needed. 08/30/16  Yes [name], MD  docusate sodium (COLACE) 100 MG capsule Take 100 mg by mouth 2 (two) times daily.   Yes [provider]  folic acid (FOLVITE) 1 MG tablet Take 1 mg by mouth daily.   Yes [provider]  ketoconazole (NIZORAL) 2 % cream Apply 1 application topically 2 (two) times daily. Patient taking differently: Apply 1 application topically 2 (two) times daily as needed for irritation.  08/10/13  Yes [name], MD  megestrol (MEGACE ES) [phone] MG/5ML suspension take 5 milliliters by mouth twice a day 08/28/17  Yes [name], MD  Methotrexate Sodium (METHOTREXATE, PF,) 50 MG/2ML injection Inject 0.5 mLs into the muscle every Monday. 05/14/16  Yes [provider]  metoCLOPramide (REGLAN) 10 MG tablet Take 1 tablet (10 mg total) by mouth every 6 (six) hours. 08/19/17  Yes [name], MD  Multiple Vitamin (MULTIVITAMIN WITH MINERALS) TABS tablet Take 1 tablet by mouth daily.   Yes [provider]  NEXIUM 40 MG capsule take 1 capsule by mouth twice a day 05/14/17  Yes [name], MD  ondansetron (ZOFRAN) 4 MG tablet Take 1 tablet (4 mg total) by mouth every 8 (eight) hours as needed for nausea or vomiting. 06/20/15  Yes Domenic Moras, PA-C  polyethylene glycol [facility] Floria Raveling) packet take 17 gram by mouth once daily 04/15/17  Yes [name], MD  tiZANidine (ZANAFLEX) 4 MG tablet take 1 tablet by mouth twice a day 04/02/17  Yes [name], MD  traMADol Veatrice Bourbon) 50 MG tablet take 1-2 tablets by mouth every 6 hours if needed 04/24/17  Yes [name], MD  vitamin C (ASCORBIC ACID) 500 MG tablet Take 500 mg by  mouth daily.    Yes [provider]  betamethasone valerate ointment (VALISONE) 0.1 % Apply a small amount topically 2 x a day for 1-2 weeks Patient not taking: Reported on 10/01/2017 02/20/16   Salvadore Dom, MD  chlorhexidine (PERIDEX) 0.12 % solution  07/25/17   [provider]  clobetasol (TEMOVATE) 0.05 % external solution Apply 1 application topically daily. Patient not taking: Reported on 10/01/2017 12/28/16   [name], MD  diazepam (VALIUM) 2 MG tablet Take 1 tablet (2 mg total) by mouth every 12 (twelve) hours as needed (dizziness). Patient not taking: Reported on 10/01/2017 12/26/15   [name], MD  HYDROcodone-acetaminophen [facility]) 10-325 MG tablet Take 1 tablet by mouth every 6 (six) hours as needed for moderate pain. 10/31/17 11/30/17  [name],  MD     Family History  Problem Relation Age of Onset  . Heart attack Neg Hx   . Colon cancer Neg Hx     Social History   Socioeconomic History  . Marital status: Single    Spouse name: Not on file  . Number of children: 1  . Years of education: Not on file  . Highest education level: Not on file  Occupational History  . Not on file  Social Needs  . Financial resource strain: Not on file  . Food insecurity:    Worry: Not on file    Inability: Not on file  . Transportation needs:    Medical: Not on file    Non-medical: Not on file  Tobacco Use  . Smoking status: Former Smoker    Packs/day: 1.00    Types: Cigarettes    Last attempt to quit: 11/27/2012    Years since quitting: 4.8  . Smokeless tobacco: Never Used  Substance and Sexual Activity  . Alcohol use: No    Alcohol/week: 0.0 oz    Comment: recovering alcoholic sober since 9024  . Drug use: No  . Sexual activity: Not Currently    Partners: Male    Birth control/protection: None  Lifestyle  . Physical activity:    Days per week: Not on file    Minutes per session: Not on file  . Stress: Not on file  Relationships  . Social  connections:    Talks on phone: Not on file    Gets together: Not on file    Attends religious service: Not on file    Active member of club or organization: Not on file    Attends meetings of clubs or organizations: Not on file    Relationship status: Not on file  Other Topics Concern  . Not on file  Social History Narrative  . Not on file    ECOG Status: 2 - Symptomatic, <50% confined to bed  Review of Systems: A 12 point ROS discussed and pertinent positives are indicated in the HPI above.  All other systems are negative.  Review of Systems  Constitutional: Negative.   HENT: Negative.   Respiratory: Negative.   Cardiovascular: Negative.   Gastrointestinal: Negative.   Genitourinary: Negative.   Musculoskeletal: Positive for arthralgias.  Neurological: Negative.     Vital Signs: LMP 06/25/2006 (LMP Unknown)   Physical Exam  Constitutional: She is oriented to person, place, and time. No distress.  Neck: Neck supple. No JVD present.  Cardiovascular: Normal rate and regular rhythm. Exam reveals no gallop and no friction rub.  No murmur heard. Pulmonary/Chest: Effort normal and breath sounds normal. No stridor. No respiratory distress. She has no wheezes. She has no rales.  Abdominal: Soft. Bowel sounds are normal. There is no tenderness. There is no rebound and no guarding.  Soft, protuberant ventral hernia.  Musculoskeletal: She exhibits deformity. She exhibits no edema.  Advanced subluxations of fingers and wrists.    Neurological: She is alert and oriented to person, place, and time.  Skin: Skin is warm and dry.  Vitals reviewed.   Imaging: Ir Radiologist Eval & Mgmt  Result Date: 10/01/2017 Please refer to notes tab for details about interventional procedure. (Op Note)   Labs:  CBC: No results for input(s): WBC, HGB, HCT, PLT in the last 8760 hours.  COAGS: No results for input(s): INR, APTT in the last 8760 hours.  BMP: No results for input(s): NA, K,  CL, CO2, GLUCOSE,  BUN, CALCIUM, CREATININE, GFRNONAA, GFRAA in the last 8760 hours.  Invalid input(s): CMP  LIVER FUNCTION TESTS: No results for input(s): BILITOT, AST, ALT, ALKPHOS, PROT, ALBUMIN in the last 8760 hours.   Assessment and Plan:  I met with [name] and her daughter.  We reviewed imaging.  Based on my measurements, the left renal tumor on the most recent scan measures approximately 2.4 x 2.4 x 2.4 cm.  The mass is clearly solid, enhancing and has the appearance of a lower grade carcinoma such as a papillary carcinoma by imaging.  This has slowly grown over the last 5 years.  Treatment options were discussed with the patient including partial nephrectomy and percutaneous cryoablation.  The mass is approachable for cryoablation based on location and is treatable based on size.  The lesion is also predominantly exophytic. Previous renal function is normal.  Based on her comorbidities including advanced rheumatoid arthritis and chronic immunosuppression, a less invasive approach may be of lower risk for complication.  Details of cryoablation to treat the renal mass were discussed.  Biopsy of the renal mass can be performed at the time of cryoablation to establish a tissue diagnosis.  After discussion, [name] would like to proceed with scheduling cryoablation.  We will begin the scheduling process at [facility].  I will also plan to consult with Dr. Trudie Reed regarding whether one or both of her chronic immunosuppressants can be held just prior to scheduled cryoablation to reduce infection risk.  Thank you for this interesting consult.  I greatly enjoyed meeting [name] and look forward to participating in their care.  A copy of this report was sent to the requesting provider on this date.    Electronically SignedAletta Edouard T 10/01/2017, 4:53 PM   I spent a total of 40 Minutes in face to face in clinical consultation, greater than 50% of which was  counseling/coordinating care for a left renal mass.

## 2017-10-01 NOTE — Telephone Encounter (Signed)
Called pt and left detailed VM advised pt to call back if needed.

## 2017-10-01 NOTE — Addendum Note (Signed)
Addended by: [name] A on: 10/01/2017 12:35 PM   Modules accepted: Orders

## 2017-10-07 ENCOUNTER — Other Ambulatory Visit: Payer: Self-pay | Admitting: Family Medicine

## 2017-10-07 ENCOUNTER — Ambulatory Visit: Payer: Medicare Other | Admitting: Family Medicine

## 2017-10-08 ENCOUNTER — Other Ambulatory Visit (HOSPITAL_COMMUNITY): Payer: Self-pay | Admitting: Interventional Radiology

## 2017-10-08 ENCOUNTER — Telehealth: Payer: Self-pay | Admitting: Family Medicine

## 2017-10-08 DIAGNOSIS — N2889 Other specified disorders of kidney and ureter: Secondary | ICD-10-CM

## 2017-10-08 DIAGNOSIS — H7292 Unspecified perforation of tympanic membrane, left ear: Secondary | ICD-10-CM

## 2017-10-08 NOTE — Telephone Encounter (Signed)
What is the reason for this?

## 2017-10-08 NOTE — Telephone Encounter (Signed)
Copied from [place] [id number]. Topic: Referral - Request >> Oct 08, 2017 10:07 AM Arletha Grippe wrote: Reason for CRM:  pt is asking for a referral  to ENT  - she says Dr Sarajane Jews sent her to one before, bur she can not remember the name.  Please call back at [phone]

## 2017-10-08 NOTE — Telephone Encounter (Signed)
Sent to PCP to set up referral for pt.

## 2017-10-09 NOTE — Telephone Encounter (Signed)
Pt is c/o left ear drainage due to hole in left ear drum. Pt stated however, she is feeling better and the drainage is not as bad.   Sent to PCP

## 2017-10-10 DIAGNOSIS — M069 Rheumatoid arthritis, unspecified: Secondary | ICD-10-CM | POA: Diagnosis not present

## 2017-10-10 NOTE — Telephone Encounter (Signed)
Called and spoke with pt. Pt advised and voiced understanding.  

## 2017-10-10 NOTE — Telephone Encounter (Signed)
I see. I went ahead and put in the referral.

## 2017-10-17 ENCOUNTER — Other Ambulatory Visit: Payer: Self-pay | Admitting: Radiology

## 2017-10-21 ENCOUNTER — Encounter: Payer: Self-pay | Admitting: Family Medicine

## 2017-10-21 ENCOUNTER — Ambulatory Visit (INDEPENDENT_AMBULATORY_CARE_PROVIDER_SITE_OTHER): Payer: Medicare Other | Admitting: Family Medicine

## 2017-10-21 VITALS — BP 160/90 | HR 94 | Temp 98.5°F

## 2017-10-21 DIAGNOSIS — F1190 Opioid use, unspecified, uncomplicated: Secondary | ICD-10-CM

## 2017-10-21 DIAGNOSIS — M069 Rheumatoid arthritis, unspecified: Secondary | ICD-10-CM

## 2017-10-21 MED ORDER — HYDROCODONE-ACETAMINOPHEN 10-325 MG PO TABS: 1.0000 | ORAL_TABLET | Freq: Four times a day (QID) | ORAL | 0 refills | Status: DC | PRN

## 2017-10-21 MED ORDER — HYDROCODONE-ACETAMINOPHEN 10-325 MG PO TABS: 1.0000 | ORAL_TABLET | Freq: Four times a day (QID) | ORAL | 0 refills | Status: AC | PRN

## 2017-10-21 MED ORDER — ESOMEPRAZOLE MAGNESIUM 40 MG PO CPDR: 40.0000 mg | DELAYED_RELEASE_CAPSULE | Freq: Every day | ORAL | 11 refills | Status: DC

## 2017-10-21 NOTE — Progress Notes (Signed)
04-24-16 (Epic) ECHO

## 2017-10-21 NOTE — Progress Notes (Signed)
   Subjective:    Patient ID: [name], female    DOB: 10-10-1955, 62 y.o.   MRN: [id number]  HPI Here for pain management. She is doing well. She is also seeing Dr. Trudie Reed for her RA.  Indication for chronic opioid: rheumatoid arthritis  Medication and dose: Norco 10-325 # pills per month: 120 Last UDS date: 07-08-17  Opioid Treatment Agreement signed (Y/N): 10-21-17 Opioid Treatment Agreement last reviewed with patient:  10-21-17 [place] reviewed this encounter (include red flags):  10-21-17    Review of Systems  Constitutional: Negative.   Respiratory: Negative.   Cardiovascular: Negative.   Musculoskeletal: Positive for arthralgias.  Neurological: Negative.        Objective:   Physical Exam  Constitutional: She is oriented to person, place, and time. She appears well-developed and well-nourished.  Cardiovascular: Normal rate, regular rhythm, normal heart sounds and intact distal pulses.  Pulmonary/Chest: Effort normal and breath sounds normal.  Neurological: She is alert and oriented to person, place, and time.          Assessment & Plan:  Pain management, meds were refilled.  [name], MD

## 2017-10-21 NOTE — Patient Instructions (Addendum)
[name]  10/21/2017   Your procedure is scheduled on: 10-23-17    Report to [facility] Main  Entrance     Report to admitting at 6:30 AM    Call this number if you have problems the morning of surgery [phone]   Remember: Do not eat food or drink liquids :After Midnight.     Take these medicines the morning of surgery with A SIP OF WATER: None                               You may not have any metal on your body including hair pins and              piercings  Do not wear jewelry, make-up, lotions, powders or perfumes, deodorant             Do not wear nail polish.  Do not shave  48 hours prior to surgery.               Do not bring valuables to the hospital. [place].  Contacts, dentures or bridgework may not be worn into surgery.  Leave suitcase in the car. After surgery it may be brought to your room.                 Please read over the following fact sheets you were given: _____________________________________________________________________          [facility] - Preparing for Surgery Before surgery, you can play an important role.  Because skin is not sterile, your skin needs to be as free of germs as possible.  You can reduce the number of germs on your skin by washing with CHG (chlorahexidine gluconate) soap before surgery.  CHG is an antiseptic cleaner which kills germs and bonds with the skin to continue killing germs even after washing. Please DO NOT use if you have an allergy to CHG or antibacterial soaps.  If your skin becomes reddened/irritated stop using the CHG and inform your nurse when you arrive at Short Stay. Do not shave (including legs and underarms) for at least 48 hours prior to the first CHG shower.  You may shave your face/neck. Please follow these instructions carefully:  1.  Shower with CHG Soap the night before surgery and the  morning of Surgery.  2.  If you choose to  wash your hair, wash your hair first as usual with your  normal  shampoo.  3.  After you shampoo, rinse your hair and body thoroughly to remove the  shampoo.                           4.  Use CHG as you would any other liquid soap.  You can apply chg directly  to the skin and wash                       Gently with a scrungie or clean washcloth.  5.  Apply the CHG Soap to your body ONLY FROM THE NECK DOWN.   Do not use on face/ open  Wound or open sores. Avoid contact with eyes, ears mouth and genitals (private parts).                       Wash face,  Genitals (private parts) with your normal soap.             6.  Wash thoroughly, paying special attention to the area where your surgery  will be performed.  7.  Thoroughly rinse your body with warm water from the neck down.  8.  DO NOT shower/wash with your normal soap after using and rinsing off  the CHG Soap.                9.  Pat yourself dry with a clean towel.            10.  Wear clean pajamas.            11.  Place clean sheets on your bed the night of your first shower and do not  sleep with pets. Day of Surgery : Do not apply any lotions/deodorants the morning of surgery.  Please wear clean clothes to the hospital/surgery center.  FAILURE TO FOLLOW THESE INSTRUCTIONS MAY RESULT IN THE CANCELLATION OF YOUR SURGERY PATIENT SIGNATURE_________________________________  NURSE SIGNATURE__________________________________  ________________________________________________________________________  WHAT IS A BLOOD TRANSFUSION? Blood Transfusion Information  A transfusion is the replacement of blood or some of its parts. Blood is made up of multiple cells which provide different functions.  Red blood cells carry oxygen and are used for blood loss replacement.  White blood cells fight against infection.  Platelets control bleeding.  Plasma helps clot blood.  Other blood products are available for specialized  needs, such as hemophilia or other clotting disorders. BEFORE THE TRANSFUSION  Who gives blood for transfusions?   Healthy volunteers who are fully evaluated to make sure their blood is safe. This is blood bank blood. Transfusion therapy is the safest it has ever been in the practice of medicine. Before blood is taken from a donor, a complete history is taken to make sure that person has no history of diseases nor engages in risky social behavior (examples are intravenous drug use or sexual activity with multiple partners). The donor's travel history is screened to minimize risk of transmitting infections, such as malaria. The donated blood is tested for signs of infectious diseases, such as HIV and hepatitis. The blood is then tested to be sure it is compatible with you in order to minimize the chance of a transfusion reaction. If you or a relative donates blood, this is often done in anticipation of surgery and is not appropriate for emergency situations. It takes many days to process the donated blood. RISKS AND COMPLICATIONS Although transfusion therapy is very safe and saves many lives, the main dangers of transfusion include:   Getting an infectious disease.  Developing a transfusion reaction. This is an allergic reaction to something in the blood you were given. Every precaution is taken to prevent this. The decision to have a blood transfusion has been considered carefully by your caregiver before blood is given. Blood is not given unless the benefits outweigh the risks. AFTER THE TRANSFUSION  Right after receiving a blood transfusion, you will usually feel much better and more energetic. This is especially true if your red blood cells have gotten low (anemic). The transfusion raises the level of the red blood cells which carry oxygen, and this usually causes an energy increase.  The  nurse administering the transfusion will monitor you carefully for complications. HOME CARE INSTRUCTIONS   No special instructions are needed after a transfusion. You may find your energy is better. Speak with your caregiver about any limitations on activity for underlying diseases you may have. SEEK MEDICAL CARE IF:   Your condition is not improving after your transfusion.  You develop redness or irritation at the intravenous (IV) site. SEEK IMMEDIATE MEDICAL CARE IF:  Any of the following symptoms occur over the next 12 hours:  Shaking chills.  You have a temperature by mouth above 102 F (38.9 C), not controlled by medicine.  Chest, back, or muscle pain.  People around you feel you are not acting correctly or are confused.  Shortness of breath or difficulty breathing.  Dizziness and fainting.  You get a rash or develop hives.  You have a decrease in urine output.  Your urine turns a dark color or changes to pink, red, or brown. Any of the following symptoms occur over the next 10 days:  You have a temperature by mouth above 102 F (38.9 C), not controlled by medicine.  Shortness of breath.  Weakness after normal activity.  The white part of the eye turns yellow (jaundice).  You have a decrease in the amount of urine or are urinating less often.  Your urine turns a dark color or changes to pink, red, or brown. Document Released: 06/08/2000 Document Revised: 09/03/2011 Document Reviewed: 01/26/2008 [facility] Patient Information 2014 [place], [place].  _______________________________________________________________________

## 2017-10-22 ENCOUNTER — Encounter (HOSPITAL_COMMUNITY): Payer: Self-pay

## 2017-10-22 ENCOUNTER — Ambulatory Visit (HOSPITAL_COMMUNITY)
Admission: RE | Admit: 2017-10-22 | Discharge: 2017-10-22 | Disposition: A | Discharge status: Home / Self Care | Payer: Medicare Other | Source: Ambulatory Visit | Attending: Radiology | Admitting: Radiology

## 2017-10-22 ENCOUNTER — Other Ambulatory Visit: Payer: Self-pay | Admitting: Radiology

## 2017-10-22 ENCOUNTER — Encounter (HOSPITAL_COMMUNITY)
Admission: RE | Admit: 2017-10-22 | Discharge: 2017-10-22 | Disposition: A | Discharge status: Home / Self Care | Payer: Medicare Other | Source: Ambulatory Visit | Attending: Interventional Radiology | Admitting: Interventional Radiology

## 2017-10-22 ENCOUNTER — Other Ambulatory Visit: Payer: Self-pay

## 2017-10-22 DIAGNOSIS — N2889 Other specified disorders of kidney and ureter: Secondary | ICD-10-CM | POA: Insufficient documentation

## 2017-10-22 DIAGNOSIS — Z01810 Encounter for preprocedural cardiovascular examination: Secondary | ICD-10-CM | POA: Insufficient documentation

## 2017-10-22 DIAGNOSIS — Z01818 Encounter for other preprocedural examination: Secondary | ICD-10-CM

## 2017-10-22 DIAGNOSIS — Z01812 Encounter for preprocedural laboratory examination: Secondary | ICD-10-CM | POA: Diagnosis not present

## 2017-10-22 LAB — BASIC METABOLIC PANEL
Anion gap: 11 (ref 5–15)
BUN: 11 mg/dL (ref 6–20)
CO2: 24 mmol/L (ref 22–32)
Calcium: 9.9 mg/dL (ref 8.9–10.3)
Chloride: 109 mmol/L (ref 101–111)
Creatinine, Ser: 0.79 mg/dL (ref 0.44–1.00)
GFR calc Af Amer: >60 mL/min (ref >60)
GFR calc non Af Amer: >60 mL/min (ref >60)
Glucose, Bld: 94 mg/dL (ref 65–99)
Potassium: 4.6 mmol/L (ref 3.5–5.1)
Sodium: 144 mmol/L (ref 135–145)

## 2017-10-22 LAB — CBC WITH DIFFERENTIAL/PLATELET
Basophils Absolute: 0 10*3/uL (ref 0.0–0.1)
Basophils Relative: 0 %
Eosinophils Absolute: 0.1 10*3/uL (ref 0.0–0.7)
Eosinophils Relative: 2 %
HCT: 37.1 % (ref 36.0–46.0)
Hemoglobin: 12.1 g/dL (ref 12.0–15.0)
Lymphocytes Relative: 32 %
Lymphs Abs: 1.9 10*3/uL (ref 0.7–4.0)
MCH: 30.3 pg (ref 26.0–34.0)
MCHC: 32.6 g/dL (ref 30.0–36.0)
MCV: 93 fL (ref 78.0–100.0)
Monocytes Absolute: 0.3 10*3/uL (ref 0.1–1.0)
Monocytes Relative: 4 %
Neutro Abs: 3.7 10*3/uL (ref 1.7–7.7)
Neutrophils Relative %: 62 %
Platelets: 296 10*3/uL (ref 150–400)
RBC: 3.99 MIL/uL (ref 3.87–5.11)
RDW: 14.1 % (ref 11.5–15.5)
WBC: 6.1 10*3/uL (ref 4.0–10.5)

## 2017-10-22 LAB — PROTIME-INR
INR: 0.88
Prothrombin Time: 11.8 seconds (ref 11.4–15.2)

## 2017-10-22 NOTE — Anesthesia Preprocedure Evaluation (Addendum)
Anesthesia Evaluation  Patient identified by MRN, date of birth, ID band Patient awake    Reviewed: Allergy & Precautions, NPO status , Patient's Chart, lab work & pertinent test results  Airway Mallampati: III  TM Distance: >3 FB Neck ROM: Limited    Dental  (+) Partial Lower, Poor Dentition   Pulmonary former smoker,    Pulmonary exam normal breath sounds clear to auscultation       Cardiovascular hypertension, Pt. on medications Normal cardiovascular exam+ dysrhythmias Atrial Fibrillation  Rhythm:Regular Rate:Normal  ECG: NSR, rate 67  ECHO: LV EF: 40% -   45%   Neuro/Psych Legally blind TIACVA, No Residual Symptoms negative psych ROS   GI/Hepatic GERD  Medicated and Controlled,(+)     substance abuse  , SBO   Endo/Other  negative endocrine ROS  Renal/GU negative Renal ROS     Musculoskeletal  (+) Arthritis , Rheumatoid disorders,  Marfan's syndrome  Chronic gout Chronic pain on narcotics   Abdominal   Peds  Hematology negative hematology ROS (+)   Anesthesia Other Findings LEFT RENAL MASS Mobile with walker and wheelchair  Reproductive/Obstetrics                           Anesthesia Physical Anesthesia Plan  ASA: III  Anesthesia Plan: General   Post-op Pain Management:    Induction: Intravenous  PONV Risk Score and Plan: 3 and Ondansetron, Dexamethasone and Treatment may vary due to age or medical condition  Airway Management Planned: Video Laryngoscope Planned and Oral ETT  Additional Equipment:   Intra-op Plan:   Post-operative Plan: Extubation in OR  Informed Consent: I have reviewed the patients History and Physical, chart, labs and discussed the procedure including the risks, benefits and alternatives for the proposed anesthesia with the patient or authorized representative who has indicated his/her understanding and acceptance.   Dental advisory given  Plan  Discussed with: CRNA  Anesthesia Plan Comments:         Anesthesia Quick Evaluation

## 2017-10-22 NOTE — Progress Notes (Signed)
10-22-17 Spoke to Dr Conrad Kamas, regarding order for Anesthesia consult pe Dr. Kathlene Cote. Per Dr. Conrad [place], unless pt has any questions or concerns that they would like addressed, they will evaluate patient day of surgery.

## 2017-10-23 ENCOUNTER — Encounter (HOSPITAL_COMMUNITY): Payer: Self-pay | Admitting: Anesthesiology

## 2017-10-23 ENCOUNTER — Encounter (HOSPITAL_COMMUNITY)
Admission: RE | Disposition: A | Discharge status: Home / Self Care | Payer: Self-pay | Source: Ambulatory Visit | Attending: Interventional Radiology

## 2017-10-23 ENCOUNTER — Observation Stay (HOSPITAL_COMMUNITY)
Admission: RE | Admit: 2017-10-23 | Discharge: 2017-10-24 | Disposition: A | Discharge status: Home / Self Care | Payer: Medicare Other | Source: Ambulatory Visit | Attending: Interventional Radiology | Admitting: Interventional Radiology

## 2017-10-23 ENCOUNTER — Ambulatory Visit (HOSPITAL_COMMUNITY): Payer: Medicare Other | Admitting: Anesthesiology

## 2017-10-23 ENCOUNTER — Other Ambulatory Visit: Payer: Self-pay

## 2017-10-23 ENCOUNTER — Ambulatory Visit (HOSPITAL_COMMUNITY)
Admission: RE | Admit: 2017-10-23 | Discharge: 2017-10-23 | Disposition: A | Discharge status: Home / Self Care | Payer: Medicare Other | Source: Ambulatory Visit | Attending: Interventional Radiology | Admitting: Interventional Radiology

## 2017-10-23 DIAGNOSIS — Z79899 Other long term (current) drug therapy: Secondary | ICD-10-CM | POA: Insufficient documentation

## 2017-10-23 DIAGNOSIS — I10 Essential (primary) hypertension: Secondary | ICD-10-CM | POA: Diagnosis not present

## 2017-10-23 DIAGNOSIS — M069 Rheumatoid arthritis, unspecified: Secondary | ICD-10-CM | POA: Diagnosis not present

## 2017-10-23 DIAGNOSIS — Z7982 Long term (current) use of aspirin: Secondary | ICD-10-CM | POA: Diagnosis not present

## 2017-10-23 DIAGNOSIS — Z86010 Personal history of colonic polyps: Secondary | ICD-10-CM | POA: Insufficient documentation

## 2017-10-23 DIAGNOSIS — F1011 Alcohol abuse, in remission: Secondary | ICD-10-CM | POA: Insufficient documentation

## 2017-10-23 DIAGNOSIS — Z9889 Other specified postprocedural states: Secondary | ICD-10-CM | POA: Diagnosis not present

## 2017-10-23 DIAGNOSIS — M810 Age-related osteoporosis without current pathological fracture: Secondary | ICD-10-CM | POA: Insufficient documentation

## 2017-10-23 DIAGNOSIS — Z9049 Acquired absence of other specified parts of digestive tract: Secondary | ICD-10-CM | POA: Insufficient documentation

## 2017-10-23 DIAGNOSIS — N2889 Other specified disorders of kidney and ureter: Secondary | ICD-10-CM | POA: Diagnosis not present

## 2017-10-23 DIAGNOSIS — C801 Malignant (primary) neoplasm, unspecified: Secondary | ICD-10-CM

## 2017-10-23 DIAGNOSIS — I5042 Chronic combined systolic (congestive) and diastolic (congestive) heart failure: Secondary | ICD-10-CM | POA: Diagnosis not present

## 2017-10-23 DIAGNOSIS — H548 Legal blindness, as defined in USA: Secondary | ICD-10-CM | POA: Insufficient documentation

## 2017-10-23 DIAGNOSIS — D49512 Neoplasm of unspecified behavior of left kidney: Secondary | ICD-10-CM | POA: Diagnosis not present

## 2017-10-23 DIAGNOSIS — Z91018 Allergy to other foods: Secondary | ICD-10-CM | POA: Diagnosis not present

## 2017-10-23 DIAGNOSIS — C642 Malignant neoplasm of left kidney, except renal pelvis: Principal | ICD-10-CM | POA: Insufficient documentation

## 2017-10-23 DIAGNOSIS — Q8740 Marfan's syndrome, unspecified: Secondary | ICD-10-CM | POA: Insufficient documentation

## 2017-10-23 DIAGNOSIS — I4892 Unspecified atrial flutter: Secondary | ICD-10-CM | POA: Diagnosis not present

## 2017-10-23 DIAGNOSIS — Z8673 Personal history of transient ischemic attack (TIA), and cerebral infarction without residual deficits: Secondary | ICD-10-CM | POA: Insufficient documentation

## 2017-10-23 DIAGNOSIS — I110 Hypertensive heart disease with heart failure: Secondary | ICD-10-CM | POA: Diagnosis not present

## 2017-10-23 DIAGNOSIS — Z96653 Presence of artificial knee joint, bilateral: Secondary | ICD-10-CM | POA: Insufficient documentation

## 2017-10-23 HISTORY — DX: Malignant (primary) neoplasm, unspecified: C80.1

## 2017-10-23 HISTORY — DX: Cardiac arrhythmia, unspecified: I49.9

## 2017-10-23 HISTORY — PX: RADIOLOGY WITH ANESTHESIA: SHX6223

## 2017-10-23 LAB — TYPE AND SCREEN
ABO/RH(D): A POS
Antibody Screen: NEGATIVE

## 2017-10-23 SURGERY — CT WITH ANESTHESIA
Anesthesia: General | Laterality: Left

## 2017-10-23 MED ORDER — HYDROMORPHONE HCL 1 MG/ML IJ SOLN
INTRAMUSCULAR | Status: DC | PRN
  Administered 2017-10-23 (×2): 0.5 mg via INTRAVENOUS

## 2017-10-23 MED ORDER — ALLOPURINOL 100 MG PO TABS
100.0000 mg | ORAL_TABLET | Freq: Every day | ORAL | Status: DC
  Administered 2017-10-23 – 2017-10-24 (×2): 100 mg via ORAL
  Filled 2017-10-23 (×2): qty 1

## 2017-10-23 MED ORDER — HYDROCODONE-ACETAMINOPHEN 5-325 MG PO TABS
1.0000 | ORAL_TABLET | ORAL | Status: DC | PRN
  Filled 2017-10-23 (×2): qty 2

## 2017-10-23 MED ORDER — PHENYLEPHRINE 40 MCG/ML (10ML) SYRINGE FOR IV PUSH (FOR BLOOD PRESSURE SUPPORT)
PREFILLED_SYRINGE | INTRAVENOUS | Status: DC | PRN
  Administered 2017-10-23 (×5): 80 ug via INTRAVENOUS

## 2017-10-23 MED ORDER — LIDOCAINE 2% (20 MG/ML) 5 ML SYRINGE
INTRAMUSCULAR | Status: DC | PRN
  Administered 2017-10-23: 60 mg via INTRAVENOUS

## 2017-10-23 MED ORDER — SODIUM CHLORIDE 0.9 % IV SOLN
INTRAVENOUS | Status: AC
  Filled 2017-10-23: qty 250

## 2017-10-23 MED ORDER — IOPAMIDOL (ISOVUE-300) INJECTION 61%
30.0000 mL | Freq: Once | INTRAVENOUS | Status: AC | PRN
  Administered 2017-10-23: 10 mL

## 2017-10-23 MED ORDER — IOPAMIDOL (ISOVUE-300) INJECTION 61%
INTRAVENOUS | Status: AC
  Filled 2017-10-23: qty 30

## 2017-10-23 MED ORDER — LACTATED RINGERS IV SOLN
INTRAVENOUS | Status: DC
  Administered 2017-10-23: 07:00:00 via INTRAVENOUS

## 2017-10-23 MED ORDER — ONDANSETRON HCL 4 MG/2ML IJ SOLN: 4.0000 mg | Freq: Four times a day (QID) | INTRAMUSCULAR | Status: DC | PRN

## 2017-10-23 MED ORDER — MIDAZOLAM HCL 2 MG/2ML IJ SOLN
INTRAMUSCULAR | Status: AC
  Filled 2017-10-23: qty 2

## 2017-10-23 MED ORDER — ALBUTEROL SULFATE (2.5 MG/3ML) 0.083% IN NEBU: 2.5000 mg | INHALATION_SOLUTION | RESPIRATORY_TRACT | Status: DC | PRN

## 2017-10-23 MED ORDER — DOCUSATE SODIUM 100 MG PO CAPS
100.0000 mg | ORAL_CAPSULE | Freq: Two times a day (BID) | ORAL | Status: DC
  Administered 2017-10-23 – 2017-10-24 (×2): 100 mg via ORAL
  Filled 2017-10-23 (×2): qty 1

## 2017-10-23 MED ORDER — LORATADINE 10 MG PO TABS
10.0000 mg | ORAL_TABLET | Freq: Every day | ORAL | Status: DC
  Administered 2017-10-23 – 2017-10-24 (×2): 10 mg via ORAL
  Filled 2017-10-23 (×3): qty 1

## 2017-10-23 MED ORDER — LABETALOL HCL 5 MG/ML IV SOLN
5.0000 mg | INTRAVENOUS | Status: DC | PRN
  Administered 2017-10-23 (×2): 5 mg via INTRAVENOUS

## 2017-10-23 MED ORDER — SENNOSIDES-DOCUSATE SODIUM 8.6-50 MG PO TABS
1.0000 | ORAL_TABLET | Freq: Every day | ORAL | Status: DC | PRN
  Filled 2017-10-23: qty 1

## 2017-10-23 MED ORDER — TRAMADOL HCL 50 MG PO TABS
50.0000 mg | ORAL_TABLET | Freq: Two times a day (BID) | ORAL | Status: DC
  Administered 2017-10-23 – 2017-10-24 (×2): 50 mg via ORAL
  Filled 2017-10-23 (×2): qty 1

## 2017-10-23 MED ORDER — MIDAZOLAM HCL 5 MG/5ML IJ SOLN
INTRAMUSCULAR | Status: DC | PRN
  Administered 2017-10-23 (×2): 1 mg via INTRAVENOUS

## 2017-10-23 MED ORDER — PROPOFOL 10 MG/ML IV BOLUS
INTRAVENOUS | Status: DC | PRN
  Administered 2017-10-23: 20 mg via INTRAVENOUS
  Administered 2017-10-23: 100 mg via INTRAVENOUS

## 2017-10-23 MED ORDER — PHENYLEPHRINE HCL 10 MG/ML IJ SOLN
INTRAVENOUS | Status: DC | PRN
  Administered 2017-10-23: 25 ug/min via INTRAVENOUS

## 2017-10-23 MED ORDER — CEFAZOLIN SODIUM-DEXTROSE 2-4 GM/100ML-% IV SOLN
2.0000 g | INTRAVENOUS | Status: AC
  Administered 2017-10-23: 2 g via INTRAVENOUS
  Filled 2017-10-23: qty 100

## 2017-10-23 MED ORDER — FENTANYL CITRATE (PF) 100 MCG/2ML IJ SOLN
INTRAMUSCULAR | Status: DC | PRN
  Administered 2017-10-23: 150 ug via INTRAVENOUS
  Administered 2017-10-23: 100 ug via INTRAVENOUS

## 2017-10-23 MED ORDER — HYDROMORPHONE HCL 2 MG/ML IJ SOLN
INTRAMUSCULAR | Status: AC
  Filled 2017-10-23: qty 1

## 2017-10-23 MED ORDER — DEXAMETHASONE SODIUM PHOSPHATE 10 MG/ML IJ SOLN
INTRAMUSCULAR | Status: DC | PRN
  Administered 2017-10-23: 10 mg via INTRAVENOUS

## 2017-10-23 MED ORDER — FENTANYL CITRATE (PF) 100 MCG/2ML IJ SOLN: 25.0000 ug | INTRAMUSCULAR | Status: DC | PRN

## 2017-10-23 MED ORDER — SUGAMMADEX SODIUM 200 MG/2ML IV SOLN
INTRAVENOUS | Status: DC | PRN
  Administered 2017-10-23: 190.4 mg via INTRAVENOUS

## 2017-10-23 MED ORDER — SODIUM CHLORIDE 0.9 % IV SOLN
INTRAVENOUS | Status: DC
  Administered 2017-10-23: 13:00:00 via INTRAVENOUS

## 2017-10-23 MED ORDER — PANTOPRAZOLE SODIUM 40 MG PO TBEC
40.0000 mg | DELAYED_RELEASE_TABLET | Freq: Every day | ORAL | Status: DC
  Administered 2017-10-23 – 2017-10-24 (×2): 40 mg via ORAL
  Filled 2017-10-23 (×2): qty 1

## 2017-10-23 MED ORDER — AMLODIPINE BESYLATE 10 MG PO TABS
10.0000 mg | ORAL_TABLET | Freq: Every day | ORAL | Status: DC
  Administered 2017-10-23 – 2017-10-24 (×2): 10 mg via ORAL
  Filled 2017-10-23 (×2): qty 1

## 2017-10-23 MED ORDER — ROCURONIUM BROMIDE 10 MG/ML (PF) SYRINGE
PREFILLED_SYRINGE | INTRAVENOUS | Status: DC | PRN
  Administered 2017-10-23: 10 mg via INTRAVENOUS
  Administered 2017-10-23: 35 mg via INTRAVENOUS
  Administered 2017-10-23: 5 mg via INTRAVENOUS
  Administered 2017-10-23: 10 mg via INTRAVENOUS
  Administered 2017-10-23: 15 mg via INTRAVENOUS

## 2017-10-23 MED ORDER — LABETALOL HCL 5 MG/ML IV SOLN
INTRAVENOUS | Status: AC
  Filled 2017-10-23: qty 4

## 2017-10-23 MED ORDER — ONDANSETRON HCL 4 MG/2ML IJ SOLN: 4.0000 mg | Freq: Once | INTRAMUSCULAR | Status: DC | PRN

## 2017-10-23 MED ORDER — FENTANYL CITRATE (PF) 250 MCG/5ML IJ SOLN
INTRAMUSCULAR | Status: AC
  Filled 2017-10-23: qty 5

## 2017-10-23 MED ORDER — TIZANIDINE HCL 4 MG PO TABS
4.0000 mg | ORAL_TABLET | Freq: Two times a day (BID) | ORAL | Status: DC
  Administered 2017-10-23 – 2017-10-24 (×2): 4 mg via ORAL
  Filled 2017-10-23 (×2): qty 1

## 2017-10-23 NOTE — Progress Notes (Signed)
Patient ID: [name], female   DOB: December 08, 1955, 62 y.o.   MRN: [id number] Pt doing ok; denies flank pain,N/V or resp difficulties; does have some arthritic pain of joints from RA VSS; afebrile Puncture site left flank with small amt oozing of blood with palpation, area soft,NT; abd soft; yellow urine in foley cath  A/P:s/p left renal mass bx/cryoablation earlier today; for overnight obs; d/c foley later this evening; check am labs, rebandage flank region and monitor closely; f/u with Dr. Kathlene Cote in [facility]

## 2017-10-23 NOTE — H&P (Signed)
Referring Physician(s): Dahlstedt,S  Supervising Physician: Aletta Edouard  Patient Status:  [facility] OP TBA  Chief Complaint:  Left renal mass  Subjective: Pt familiar to IR service from recent consultation with Dr. Kathlene Cote on 10/01/17 to discuss treatment options for a slowly enlarging left upper pole renal mass, currently measuring approximately 2.5 cm.  She was deemed an appropriate candidate for CT-guided cryoablation/possible biopsy of the left renal mass and presents today for the procedure.  She currently denies fever, headache, chest pain, dyspnea, cough, abdominal pain, nausea, vomiting or bleeding.  She does have some intermittent back pain/arthralgias.  She is blind.  Additional history as below.  Past Medical History:  Diagnosis Date  . Atrial flutter ([place])   . Chronic gout 2008  . Diverticulitis of large intestine with perforation   . Dysrhythmia   . Fibroid   . Hearing aid worn    pt wears bilateral hearing aids  . Hernia 4/08  . Hx of adenomatous polyp of colon 11/13/2016  . Hypertension   . Legally blind    since pt was a teenager  . Marfan's syndrome affecting skin    with scolosis  . Osteoporosis   . Rheumatoid arthritis [facility])    sees Dr. Gavin Pound   . SBO (small bowel obstruction) ([place]) 01/05/2013  . Small bowel obstruction due to adhesions ([place])   . Status post bunionectomy 1/10   bilateral   . Stroke ([place]) 2/08  . Substance abuse ([place])    recovering alcoholic Z61 years   . 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 ABLATION  01/04/14   atrial flutter ablation (2 circuits) by Dr Lovena Le  . ATRIAL FLUTTER ABLATION N/A 01/04/2014   Procedure: ATRIAL FLUTTER ABLATION;  Surgeon: Evans Lance, MD;  Location: [facility] CATH LAB;  Service: Cardiovascular;  Laterality: N/A;  . BUNIONECTOMY Bilateral 1/10  . COLON SURGERY     colectomy  . COLOSTOMY  11/27/2012  . HERNIA REPAIR    . IR RADIOLOGIST  EVAL & MGMT  10/01/2017  . LAPAROSCOPIC ABDOMINAL EXPLORATION N/A 01/06/2013   Procedure: LAPAROSCOPIC ABDOMINAL EXPLORATION;  Surgeon: Adin Hector, MD;  Location: [facility] ORS;  Service: General;  Laterality: N/A;  . LAPAROSCOPIC LYSIS OF ADHESIONS N/A 01/06/2013   Procedure: LAPAROSCOPIC LYSIS OF ADHESIONS/ INTEROTOMY REPAIR;  Surgeon: Adin Hector, MD;  Location: [facility] ORS;  Service: General;  Laterality: N/A;  . LAPAROTOMY N/A 11/27/2012   Procedure: EXPLORATORY LAPAROTOMY SIGMOID COLECTOMY, COLOSTOMY;  Surgeon: Madilyn Hook, DO;  Location: [facility] ORS;  Service: General;  Laterality: N/A;  . TOTAL KNEE ARTHROPLASTY Bilateral 6/08    Allergies: Spinach  Medications: Prior to Admission medications   Medication Sig Start Date End Date Taking? Authorizing Provider  Abatacept (ORENCIA) 125 MG/ML SOSY Inject 125 mg into the skin every Monday.    Yes [provider]  allopurinol (ZYLOPRIM) 100 MG tablet take 1 tablet by mouth once daily 08/13/17  Yes [name], MD  amLODipine (NORVASC) 10 MG tablet take 1 tablet by mouth once daily 08/13/17  Yes [name], MD  aspirin EC 81 MG tablet Take 1 tablet (81 mg total) by mouth daily. 05/11/14  Yes Verlee Monte, MD  CALCIUM PO Take 1 tablet by mouth 2 (two) times daily.   Yes [provider]  cetirizine (ZYRTEC) 10 MG tablet Take 1 tablet (10 mg total) by mouth daily. 11/17/14  Yes  [name], MD  chlorhexidine (PERIDEX) 0.12 % solution Use as directed 10 mLs in the mouth or throat daily.  07/25/17  Yes [provider]  Cholecalciferol (VITAMIN D) 2000 units CAPS Take 2,000 Units by mouth daily.   Yes [provider]  docusate sodium (COLACE) 100 MG capsule Take 100 mg by mouth 2 (two) times daily.   Yes [provider]  esomeprazole (NEXIUM) 40 MG capsule Take 1 capsule (40 mg total) by mouth daily. 10/21/17  Yes [name], MD  folic acid (FOLVITE) 093 MCG tablet Take 400 mcg by mouth daily.   Yes  [provider]  HYDROcodone-acetaminophen (NORCO) 10-325 MG tablet Take 1 tablet by mouth every 6 (six) hours as needed for moderate pain. 12/21/17 01/20/18 Yes [name], MD  megestrol (MEGACE ES) 625 MG/5ML suspension take 5 milliliters by mouth twice a day Patient taking differently: take 5 milliliters by mouth three times daily before meals as needed for appetite 08/28/17  Yes [name], MD  Methotrexate Sodium (METHOTREXATE, PF,) 50 MG/2ML injection Inject 0.5 mLs into the muscle every Monday. 05/14/16  Yes [provider]  Multiple Vitamin (MULTIVITAMIN WITH MINERALS) TABS tablet Take 1 tablet by mouth daily.   Yes [provider]  tiZANidine (ZANAFLEX) 4 MG tablet take 1 tablet by mouth twice a day 04/02/17  Yes [name], MD  traMADol Veatrice Bourbon) 50 MG tablet take 1-2 tablets by mouth every 6 hours if needed Patient taking differently: Take 50 mg by mouth twice daily 04/24/17  Yes [name], MD  triamcinolone (KENALOG) 0.025 % cream Apply 1 application topically daily as needed (itching).   Yes [provider]  vitamin B-12 (CYANOCOBALAMIN) 500 MCG tablet Take 500 mcg by mouth daily.   Yes [provider]  vitamin C (ASCORBIC ACID) 500 MG tablet Take 500 mg by mouth daily.    Yes [provider]  albuterol (PROVENTIL HFA;VENTOLIN HFA) 108 (90 BASE) MCG/ACT inhaler Inhale 1 puff into the lungs every 4 (four) hours as needed for wheezing or shortness of breath. 12/29/13   [name], MD  ciprofloxacin-dexamethasone (CIPRODEX) otic suspension Place 4 drops into both ears 2 (two) times daily as needed. Patient taking differently: Place 4 drops into both ears 2 (two) times daily as needed (ear pain).  08/30/16   [name], MD  diclofenac sodium (VOLTAREN) 1 % GEL Apply 1 application topically 4 (four) times daily as needed (arthritis).    [provider]  ketoconazole (NIZORAL) 2 % cream Apply 1 application topically  daily as needed for irritation.    [provider]  metoCLOPramide (REGLAN) 10 MG tablet Take 1 tablet (10 mg total) by mouth every 6 (six) hours. 08/19/17   [name], MD  polyethylene glycol [facility] Floria Raveling) packet take 17 gram by mouth once daily Patient taking differently: take 17 gram by mouth once daily as needed for constipation 04/15/17   [name], MD     Vital Signs: BP (!) 168/95   Pulse 90   Temp 98.8 F (37.1 C) (Oral)   Resp 16   LMP 06/25/2006 (LMP Unknown)   SpO2 94%   Physical Exam awake, alert.  Chest clear to auscultation bilaterally.  Heart with regular rate and rhythm.  Abdomen soft, positive bowel sounds, protuberant ventral hernia , left ostomy intact.  No lower extremity edema.  She does have advanced subluxations of fingers and wrists secondary to  rheumatoid arthritis  Imaging: Dg Chest 1 View  Result Date: 10/22/2017 CLINICAL DATA:  Pre-op for ablation. EXAM: CHEST  1 VIEW COMPARISON:  08/14/2016 FINDINGS: There is no focal parenchymal opacity. There is no pleural effusion or pneumothorax. The heart and mediastinal contours are unremarkable. There is severe osteoarthropathy of the right glenohumeral joint with osteolysis which may reflect underlying inflammatory arthropathy. There is severe osteoarthritis of the left glenohumeral joint. There is a severe levoscoliosis of the thoracolumbar spine. IMPRESSION: No active disease. Electronically Signed   By: Kathreen Devoid   On: 10/22/2017 16:51    Labs:  CBC: Recent Labs    10/22/17 1117  WBC 6.1  HGB 12.1  HCT 37.1  PLT 296    COAGS: Recent Labs    10/22/17 1117  INR 0.88    BMP: Recent Labs    10/22/17 1117  NA 144  K 4.6  CL 109  CO2 24  GLUCOSE 94  BUN 11  CALCIUM 9.9  CREATININE 0.79  GFRNONAA >60  GFRAA >60    LIVER FUNCTION TESTS: No results for input(s): BILITOT, AST, ALT, ALKPHOS, PROT, ALBUMIN in the last 8760 hours.  Assessment and Plan: Patient with  history of slowly enlarging left upper pole renal mass concerning for renal cell carcinoma.  Following recent consultation with Dr. Kathlene Cote she was deemed an appropriate candidate for CT-guided cryoablation and possible biopsy of the left renal mass and presents today for the procedure.  Details/risks of procedure, including but not limited to, internal bleeding, infection, injury to adjacent structures, anesthesia related complications discussed with patient and family with their understanding and consent.  Post procedure she will be admitted for overnight observation.This procedure involves the use of X-rays and because of the nature of the planned procedure, it is possible that we will have prolonged use of CT.  Potential radiation risks to you include (but are not limited to) the following: - A slightly elevated risk for cancer  several years later in life. This risk is typically less than 0.5% percent. This risk is low in comparison to the normal incidence of human cancer, which is 33% for women and 50% for men according to the [place]. - Radiation induced injury can include skin redness, resembling a rash, tissue breakdown / ulcers and hair loss (which can be temporary or permanent).   The likelihood of either of these occurring depends on the difficulty of the procedure and whether you are sensitive to radiation due to previous procedures, disease, or genetic conditions.   IF your procedure requires a prolonged use of radiation, you will be notified and given written instructions for further action.  It is your responsibility to monitor the irradiated area for the 2 weeks following the procedure and to notify your physician if you are concerned that you have suffered a radiation induced injury.      Electronically Signed: D. Rowe Robert, PA-C 10/23/2017, 8:37 AM   I spent a total of 30 minutes at the the patient's bedside AND on the patient's hospital floor or unit, greater  than 50% of which was counseling/coordinating care for CT-guided left renal mass cryoablation and possible biopsy

## 2017-10-23 NOTE — Anesthesia Procedure Notes (Signed)
Procedure Name: Intubation Date/Time: 10/23/2017 8:56 AM Performed by: Lavina Hamman, CRNA Pre-anesthesia Checklist: Patient identified, Emergency Drugs available, Suction available, Patient being monitored and Timeout performed Patient Re-evaluated:Patient Re-evaluated prior to induction Oxygen Delivery Method: Circle system utilized Preoxygenation: Pre-oxygenation with 100% oxygen Induction Type: IV induction Ventilation: Mask ventilation without difficulty Laryngoscope Size: Mac and 4 Grade View: Grade III Tube type: Oral Tube size: 7.0 mm Number of attempts: 2 Airway Equipment and Method: Stylet and Video-laryngoscopy Placement Confirmation: ETT inserted through vocal cords under direct vision,  positive ETCO2,  CO2 detector and breath sounds checked- equal and bilateral Secured at: 21 cm Tube secured with: Tape Dental Injury: Teeth and Oropharynx as per pre-operative assessment  Comments: Head and neck maintained in neutral position per Dr Roanna Banning.  Poor dentition, anterior view with Mac 4 and neutral head and neck position, G1 view with Glidescope 3, easy pass of ett, ATOI.

## 2017-10-23 NOTE — Transfer of Care (Signed)
Immediate Anesthesia Transfer of Care Note  Patient: [name]  Procedure(s) Performed: CT RENAL CRYO AND BIOPSY (Left )  Patient Location: PACU  Anesthesia Type:General  Level of Consciousness: awake, alert  and oriented  Airway & Oxygen Therapy: Patient Spontanous Breathing and Patient connected to face mask oxygen  Post-op Assessment: Report given to RN  Post vital signs: Reviewed and stable  Last Vitals:  Vitals Value Taken Time  BP 155/103 10/23/2017 12:22 PM  Temp    Pulse    Resp 10 10/23/2017 12:24 PM  SpO2    Vitals shown include unvalidated device data.  Last Pain:  Vitals:   10/23/17 0645  TempSrc: Oral  PainSc: 0-No pain         Complications: No apparent anesthesia complications

## 2017-10-23 NOTE — Procedures (Signed)
Interventional Radiology Procedure Note  Procedure: CT guided biopsy and cryoablation of left renal mass  Anesthesia: General  Complications: None  Estimated Blood Loss: < 10 mL  Findings: 2.6 cm left renal mass.  18 G core biopsy x 1 via 17 G needle.  Hydrodissection performed. Cryoablation with 3 Galil Ice Rod Plus probes.  Plan: PACU recovery followed by overnight observation.  Venetia Night. Kathlene Cote, M.D Pager:  [phone]

## 2017-10-24 ENCOUNTER — Other Ambulatory Visit: Payer: Self-pay | Admitting: *Deleted

## 2017-10-24 ENCOUNTER — Encounter (HOSPITAL_COMMUNITY): Payer: Self-pay | Admitting: Interventional Radiology

## 2017-10-24 DIAGNOSIS — Z9889 Other specified postprocedural states: Secondary | ICD-10-CM | POA: Diagnosis not present

## 2017-10-24 DIAGNOSIS — H548 Legal blindness, as defined in USA: Secondary | ICD-10-CM | POA: Diagnosis not present

## 2017-10-24 DIAGNOSIS — Z86010 Personal history of colonic polyps: Secondary | ICD-10-CM | POA: Diagnosis not present

## 2017-10-24 DIAGNOSIS — I4892 Unspecified atrial flutter: Secondary | ICD-10-CM | POA: Diagnosis not present

## 2017-10-24 DIAGNOSIS — Z8673 Personal history of transient ischemic attack (TIA), and cerebral infarction without residual deficits: Secondary | ICD-10-CM | POA: Diagnosis not present

## 2017-10-24 DIAGNOSIS — M069 Rheumatoid arthritis, unspecified: Secondary | ICD-10-CM | POA: Diagnosis not present

## 2017-10-24 DIAGNOSIS — N2889 Other specified disorders of kidney and ureter: Secondary | ICD-10-CM | POA: Diagnosis not present

## 2017-10-24 DIAGNOSIS — C642 Malignant neoplasm of left kidney, except renal pelvis: Secondary | ICD-10-CM | POA: Diagnosis not present

## 2017-10-24 DIAGNOSIS — Z79899 Other long term (current) drug therapy: Secondary | ICD-10-CM | POA: Diagnosis not present

## 2017-10-24 DIAGNOSIS — Z7982 Long term (current) use of aspirin: Secondary | ICD-10-CM | POA: Diagnosis not present

## 2017-10-24 DIAGNOSIS — Z91018 Allergy to other foods: Secondary | ICD-10-CM | POA: Diagnosis not present

## 2017-10-24 DIAGNOSIS — Z96653 Presence of artificial knee joint, bilateral: Secondary | ICD-10-CM | POA: Diagnosis not present

## 2017-10-24 DIAGNOSIS — Z9049 Acquired absence of other specified parts of digestive tract: Secondary | ICD-10-CM | POA: Diagnosis not present

## 2017-10-24 DIAGNOSIS — I10 Essential (primary) hypertension: Secondary | ICD-10-CM | POA: Diagnosis not present

## 2017-10-24 DIAGNOSIS — M810 Age-related osteoporosis without current pathological fracture: Secondary | ICD-10-CM | POA: Diagnosis not present

## 2017-10-24 LAB — CBC
HCT: 29.9 % — ABNORMAL LOW (ref 36.0–46.0)
Hemoglobin: 9.7 g/dL — ABNORMAL LOW (ref 12.0–15.0)
MCH: 30.7 pg (ref 26.0–34.0)
MCHC: 32.4 g/dL (ref 30.0–36.0)
MCV: 94.6 fL (ref 78.0–100.0)
Platelets: 239 10*3/uL (ref 150–400)
RBC: 3.16 MIL/uL — ABNORMAL LOW (ref 3.87–5.11)
RDW: 14.5 % (ref 11.5–15.5)
WBC: 8.1 10*3/uL (ref 4.0–10.5)

## 2017-10-24 LAB — BASIC METABOLIC PANEL
Anion gap: 8 (ref 5–15)
BUN: 12 mg/dL (ref 6–20)
CO2: 23 mmol/L (ref 22–32)
Calcium: 8.3 mg/dL — ABNORMAL LOW (ref 8.9–10.3)
Chloride: 112 mmol/L — ABNORMAL HIGH (ref 101–111)
Creatinine, Ser: 0.74 mg/dL (ref 0.44–1.00)
GFR calc Af Amer: >60 mL/min (ref >60)
GFR calc non Af Amer: >60 mL/min (ref >60)
Glucose, Bld: 119 mg/dL — ABNORMAL HIGH (ref 65–99)
Potassium: 3.5 mmol/L (ref 3.5–5.1)
Sodium: 143 mmol/L (ref 135–145)

## 2017-10-24 NOTE — Anesthesia Postprocedure Evaluation (Signed)
Anesthesia Post Note  Patient: [name]  Procedure(s) Performed: CT RENAL CRYO AND BIOPSY (Left )     Patient location during evaluation: PACU Anesthesia Type: General Level of consciousness: awake and alert Pain management: pain level controlled Vital Signs Assessment: post-procedure vital signs reviewed and stable Respiratory status: spontaneous breathing, nonlabored ventilation, respiratory function stable and patient connected to nasal cannula oxygen Cardiovascular status: blood pressure returned to baseline and stable Postop Assessment: no apparent nausea or vomiting Anesthetic complications: no    Last Vitals:  Vitals:   10/23/17 1425 10/24/17 0451  BP: 130/83   Pulse: 100   Resp: 15   Temp: 36.8 C   SpO2: 94% 100%    Last Pain:  Vitals:   10/23/17 1952  TempSrc:   PainSc: 5                  [name] S

## 2017-10-24 NOTE — Discharge Summary (Signed)
Patient ID: [name] MRN: [id number] DOB/AGE: 1956/05/08 62 y.o.  Admit date: 10/23/2017 Discharge date: 10/24/2017  Supervising Physician: Aletta Edouard  Patient Status: [facility] - In-pt  Admission Diagnoses: Left renal mass  Discharge Diagnoses: Left renal mass, status post CT-guided cryoablation and biopsy on 11/11/2017 Active Problems:   Left renal mass  Past Medical History:  Diagnosis Date  . Atrial flutter ([place])   . Chronic gout 2008  . Diverticulitis of large intestine with perforation   . Dysrhythmia   . Fibroid   . Hearing aid worn    pt wears bilateral hearing aids  . Hernia 4/08  . Hx of adenomatous polyp of colon 11/13/2016  . Hypertension   . Legally blind    since pt was a teenager  . Marfan's syndrome affecting skin    with scolosis  . Osteoporosis   . Rheumatoid arthritis [facility])    sees Dr. Gavin Pound   . SBO (small bowel obstruction) ([place]) 01/05/2013  . Small bowel obstruction due to adhesions ([place])   . Status post bunionectomy 1/10   bilateral   . Stroke ([place]) 2/08  . Substance abuse ([place])    recovering alcoholic D32 years   . 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 ABLATION  01/04/14   atrial flutter ablation (2 circuits) by Dr Lovena Le  . ATRIAL FLUTTER ABLATION N/A 01/04/2014   Procedure: ATRIAL FLUTTER ABLATION;  Surgeon: Evans Lance, MD;  Location: [facility] CATH LAB;  Service: Cardiovascular;  Laterality: N/A;  . BUNIONECTOMY Bilateral 1/10  . COLON SURGERY     colectomy  . COLOSTOMY  11/27/2012  . HERNIA REPAIR    . IR RADIOLOGIST EVAL & MGMT  10/01/2017  . LAPAROSCOPIC ABDOMINAL EXPLORATION N/A 01/06/2013   Procedure: LAPAROSCOPIC ABDOMINAL EXPLORATION;  Surgeon: Adin Hector, MD;  Location: [facility] ORS;  Service: General;  Laterality: N/A;  . LAPAROSCOPIC LYSIS OF ADHESIONS N/A 01/06/2013   Procedure: LAPAROSCOPIC LYSIS OF ADHESIONS/ INTEROTOMY REPAIR;  Surgeon: Adin Hector,  MD;  Location: [facility] ORS;  Service: General;  Laterality: N/A;  . LAPAROTOMY N/A 11/27/2012   Procedure: EXPLORATORY LAPAROTOMY SIGMOID COLECTOMY, COLOSTOMY;  Surgeon: Madilyn Hook, DO;  Location: [facility] ORS;  Service: General;  Laterality: N/A;  . TOTAL KNEE ARTHROPLASTY Bilateral 6/08     Discharged Condition: good  Hospital Course: [name] is a 62 year old female who was seen in consultation by Dr. Kathlene Cote on 10/01/17 to discuss treatment options for slowly enlarging left upper pole renal mass measuring approximately 2.5 cm. She was deemed an appropriate candidate for CT-guided cryoablation /possible biopsy of the left renal mass and presented to [facility] on 5/1 for the procedure.  She underwent CT-guided cryoablation and biopsy of the left renal mass via general anesthesia on 5/1 without immediate complications.  She was subsequently admitted for overnight observation. She did well overnight with only minimal left flank discomfort.  On the morning of discharge she was stable.  She does have some mild left flank pain especially with movement.  She denied fever, headache, respiratory problems, abdominal pain, nausea, vomiting or hematuria.  She was able to tolerate her diet and void without difficulty.  Follow-up CBC revealed WBC 8.1, hemoglobin 9.7, platelets 239k.  Potassium was 3.5, creatinine 0.74.  Above findings were discussed with Dr. Kathlene Cote and patient was deemed stable for discharge at this time.  She will be scheduled for follow-up in  the IR clinic with Dr. Kathlene Cote in 4 weeks.  She will continue her current home medications.  She was told to contact our service with any additional questions or concerns.  Consults: none  Significant Diagnostic Studies:  Results for orders placed or performed during the hospital encounter of 10/23/17  CBC  Result Value Ref Range   WBC 8.1 4.0 - 10.5 K/uL   RBC 3.16 (L) 3.87 - 5.11 MIL/uL   Hemoglobin 9.7 (L) 12.0 - 15.0 g/dL   HCT 29.9 (L) 36.0  - 46.0 %   MCV 94.6 78.0 - 100.0 fL   MCH 30.7 26.0 - 34.0 pg   MCHC 32.4 30.0 - 36.0 g/dL   RDW 14.5 11.5 - 15.5 %   Platelets 239 150 - 400 K/uL  Basic metabolic panel  Result Value Ref Range   Sodium 143 135 - 145 mmol/L   Potassium 3.5 3.5 - 5.1 mmol/L   Chloride 112 (H) 101 - 111 mmol/L   CO2 23 22 - 32 mmol/L   Glucose, Bld 119 (H) 65 - 99 mg/dL   BUN 12 6 - 20 mg/dL   Creatinine, Ser 0.74 0.44 - 1.00 mg/dL   Calcium 8.3 (L) 8.9 - 10.3 mg/dL   GFR calc non Af Amer >60 >60 mL/min   GFR calc Af Amer >60 >60 mL/min   Anion gap 8 5 - 15     Treatments: CT-guided cryoablation and biopsy of left renal mass via general anesthesia on 10/23/2017  Discharge Exam: Blood pressure 130/83, pulse 100, temperature 98.3 F (36.8 C), temperature source Oral, resp. rate 15, last menstrual period 06/25/2006, SpO2 100 %. Patient awake, alert.  Chest clear to auscultation bilaterally.  Heart with slightly tachycardic but regular rhythm.  Abdomen soft, positive bowel sounds, protuberant ventral hernia; left ostomy intact; no LE edema; puncture site left flank clean, dry, mildly tender, soft to touch; advance subluxations of fingers and wrists secondary to rheumatoid arthritis.  Disposition: Discharge disposition: 01-Home or Self Care       Discharge Instructions    Call MD for:  difficulty breathing, headache or visual disturbances   Complete by:  As directed    Call MD for:  extreme fatigue   Complete by:  As directed    Call MD for:  hives   Complete by:  As directed    Call MD for:  persistant dizziness or light-headedness   Complete by:  As directed    Call MD for:  persistant nausea and vomiting   Complete by:  As directed    Call MD for:  redness, tenderness, or signs of infection (pain, swelling, redness, odor or green/yellow discharge around incision site)   Complete by:  As directed    Call MD for:  severe uncontrolled pain   Complete by:  As directed    Call MD for:   temperature >100.4   Complete by:  As directed    Change dressing (specify)   Complete by:  As directed    May apply bandage to puncture site for next 2-3 days and change daily; may wash site with soap and water   Diet - low sodium heart healthy   Complete by:  As directed    Increase activity slowly   Complete by:  As directed    Lifting restrictions   Complete by:  As directed    No heavy lifting for 3-4 days   May shower / Bathe   Complete by:  As  directed    Walk with assistance   Complete by:  As directed      Allergies as of 10/24/2017      Reactions   Spinach Itching, Rash, Other (See Comments)   Welts.      Medication List    TAKE these medications   albuterol 108 (90 Base) MCG/ACT inhaler Commonly known as:  PROVENTIL HFA;VENTOLIN HFA Inhale 1 puff into the lungs every 4 (four) hours as needed for wheezing or shortness of breath.   allopurinol 100 MG tablet Commonly known as:  ZYLOPRIM take 1 tablet by mouth once daily   amLODipine 10 MG tablet Commonly known as:  NORVASC take 1 tablet by mouth once daily   aspirin EC 81 MG tablet Take 1 tablet (81 mg total) by mouth daily.   CALCIUM PO Take 1 tablet by mouth 2 (two) times daily.   cetirizine 10 MG tablet Commonly known as:  ZYRTEC Take 1 tablet (10 mg total) by mouth daily.   chlorhexidine 0.12 % solution Commonly known as:  PERIDEX Use as directed 10 mLs in the mouth or throat daily.   ciprofloxacin-dexamethasone OTIC suspension Commonly known as:  CIPRODEX Place 4 drops into both ears 2 (two) times daily as needed. What changed:  reasons to take this   diclofenac sodium 1 % Gel Commonly known as:  VOLTAREN Apply 1 application topically 4 (four) times daily as needed (arthritis).   docusate sodium 100 MG capsule Commonly known as:  COLACE Take 100 mg by mouth 2 (two) times daily.   esomeprazole 40 MG capsule Commonly known as:  NEXIUM Take 1 capsule (40 mg total) by mouth daily.   folic  acid 324 MCG tablet Commonly known as:  FOLVITE Take 400 mcg by mouth daily.   HYDROcodone-acetaminophen 10-325 MG tablet Commonly known as:  NORCO Take 1 tablet by mouth every 6 (six) hours as needed for moderate pain. Start taking on:  12/21/2017   ketoconazole 2 % cream Commonly known as:  NIZORAL Apply 1 application topically daily as needed for irritation.   megestrol 625 MG/5ML suspension Commonly known as:  MEGACE ES take 5 milliliters by mouth twice a day What changed:  See the new instructions.   methotrexate (PF) 50 MG/2ML injection Inject 0.5 mLs into the muscle every Monday.   metoCLOPramide 10 MG tablet Commonly known as:  REGLAN Take 1 tablet (10 mg total) by mouth every 6 (six) hours.   multivitamin with minerals Tabs tablet Take 1 tablet by mouth daily.   ORENCIA 125 MG/ML Sosy Generic drug:  Abatacept Inject 125 mg into the skin every Monday.   polyethylene glycol packet Commonly known as:  MIRALAX / GLYCOLAX take 17 gram by mouth once daily What changed:  See the new instructions.   tiZANidine 4 MG tablet Commonly known as:  ZANAFLEX take 1 tablet by mouth twice a day   traMADol 50 MG tablet Commonly known as:  ULTRAM take 1-2 tablets by mouth every 6 hours if needed What changed:  See the new instructions.   triamcinolone 0.025 % cream Commonly known as:  KENALOG Apply 1 application topically daily as needed (itching).   vitamin B-12 500 MCG tablet Commonly known as:  CYANOCOBALAMIN Take 500 mcg by mouth daily.   vitamin C 500 MG tablet Commonly known as:  ASCORBIC ACID Take 500 mg by mouth daily.   Vitamin D 2000 units Caps Take 2,000 Units by mouth daily.  Discharge Care Instructions  (From admission, onward)        Start     Ordered   10/24/17 0000  Change dressing (specify)    Comments:  May apply bandage to puncture site for next 2-3 days and change daily; may wash site with soap and water   10/24/17 1031      Follow-up Information    Aletta Edouard, MD Follow up.   Specialties:  Interventional Radiology, Radiology Why:  Radiology service will call you with follow-up appointment with Dr. Kathlene Cote in the [place] clinic in 4 weeks; call [phone] or [phone] with any questions Contact information: [address] [place] [address] [phone]        Franchot Gallo, MD Follow up.   Specialty:  Urology Why:  follow up with Dr. Diona Fanti as scheduled Contact information: [place] [address] [phone]            Electronically Signed: D. Rowe Robert, PA-C 10/24/2017, 10:34 AM   I have spent less than 30 minutes discharging [name].

## 2017-10-24 NOTE — Discharge Instructions (Addendum)
Percutaneous Kidney Biopsy, Care After This sheet gives you information about how to care for yourself after your procedure. Your health care provider may also give you more specific instructions. If you have problems or questions, contact your health care provider. What can I expect after the procedure? After the procedure, it is common to have:  Pain or soreness near the area where the needle went through your skin (biopsy site).  Bright pink or cloudy urine for 24 hours after the procedure.  Follow these instructions at home: Activity  Return to your normal activities as told by your health care provider. Ask your health care provider what activities are safe for you.  Do not drive for 24 hours if you were given a medicine to help you relax (sedative).  Do not lift anything that is heavier than 10 lb (4.5 kg) until your health care provider tells you that it is safe.  Avoid activities that take a lot of effort (are strenuous) until your health care provider approves. Most people will have to wait 2 weeks before returning to activities such as exercise or sexual intercourse. General instructions  Take over-the-counter and prescription medicines only as told by your health care provider.  You may eat and drink after your procedure. Follow instructions from your health care provider about eating or drinking restrictions.  Check your biopsy site every day for signs of infection. Check for: ? More redness, swelling, or pain. ? More fluid or blood. ? Warmth. ? Pus or a bad smell.  Keep all follow-up visits as told by your health care provider. This is important. Contact a health care provider if:  You have more redness, swelling, or pain around your biopsy site.  You have more fluid or blood coming from your biopsy site.  Your biopsy site feels warm to the touch.  You have pus or a bad smell coming from your biopsy site.  You have blood in your urine more than 24 hours after  your procedure. Get help right away if:  You have dark red or brown urine.  You have a fever.  You are unable to urinate.  You feel burning when you urinate.  You feel faint.  You have severe pain in your abdomen or side. This information is not intended to replace advice given to you by your health care provider. Make sure you discuss any questions you have with your health care provider. Document Released: 02/11/2013 Document Revised: 03/23/2016 Document Reviewed: 03/23/2016 Elsevier Interactive Patient Education  2018 [employer]. Cryoablation, Care After This sheet gives you information about how to care for yourself after your procedure. Your health care provider may also give you more specific instructions. If you have problems or questions, contact your health care provider. What can I expect after the procedure? After the procedure, it is common to have:  Soreness around the treatment area.  Mild pain and swelling in the treatment area.  Follow these instructions at home: Treatment area care   Follow instructions from your health care provider about how to take care of your incision. Make sure you: ? Wash your hands with soap and water before you change your bandage (dressing). If soap and water are not available, use hand sanitizer. ? Change your dressing as told by your health care provider. ? Leave stitches (sutures) in place. They may need to stay in place for 2 weeks or longer.  Check your treatment area every day for signs of infection. Check for: ? More  redness, swelling, or pain. ? More fluid or blood. ? Warmth. ? Pus or a bad smell.  Keep the treated area clean, dry, and covered with a dressing until it has healed. Clean the area with soap and water or as told by your health care provider.  You may shower if your health care provider approves. If your bandage gets wet, change it right away. Activity  Follow instructions from your health care provider  about any activity limitations.  Do not drive for 24 hours if you received a medicine to help you relax (sedative). General instructions  Take over-the-counter and prescription medicines only as told by your health care provider.  Keep all follow-up visits as told by your health care provider. This is important. Contact a health care provider if:  You do not have a bowel movement for 2 days.  You have nausea or vomiting.  You have more redness, swelling, or pain around your treatment area.  You have more fluid or blood coming from your treatment area.  Your treatment area feels warm to the touch.  You have pus or a bad smell coming from your treatment area.  You have a fever. Get help right away if:  You have severe pain.  You have trouble swallowing or breathing.  You have severe weakness or dizziness.  You have chest pain or shortness of breath. This information is not intended to replace advice given to you by your health care provider. Make sure you discuss any questions you have with your health care provider. Document Released: 04/01/2013 Document Revised: 12/30/2015 Document Reviewed: 11/09/2015 Elsevier Interactive Patient Education  [employer].

## 2017-10-31 ENCOUNTER — Other Ambulatory Visit: Payer: Self-pay | Admitting: Family Medicine

## 2017-10-31 NOTE — Telephone Encounter (Signed)
Copied from [place] [id number]. Topic: Quick Communication - Rx Refill/Question >> Oct 31, 2017  3:01 PM Cleaster Corin, [place] wrote: Medication: traMADol (ULTRAM) 50 MG tablet [[id number]]  Has the patient contacted their pharmacy? yes (Agent: If no, request that the patient contact the pharmacy for the refill.) Preferred Pharmacy (with phone number or street name):[facility] [phone] - [place], [address] [place] [address] Phone: [phone] Fax: [phone]   Agent: Please be advised that RX refills may take up to 3 business days. We ask that you follow-up with your pharmacy.

## 2017-11-01 NOTE — Telephone Encounter (Signed)
Last OV 10/21/2017   Last refilled 04/24/2017 disp 180 with 3 refills   Sent to PCP to advise

## 2017-11-01 NOTE — Telephone Encounter (Signed)
Request for refill to Tramadol 50mg , last filled on 04/24/17 #180 with 5 refills  LOV: 10/18/17 Dr. Sarajane Jews  [facility]    [place]

## 2017-11-04 MED ORDER — TRAMADOL HCL 50 MG PO TABS: ORAL_TABLET | ORAL | 3 refills | Status: DC

## 2017-11-04 NOTE — Telephone Encounter (Signed)
Call in #180 with 3 rf 

## 2017-11-04 NOTE — Telephone Encounter (Signed)
Called in rx

## 2017-11-05 DIAGNOSIS — Z933 Colostomy status: Secondary | ICD-10-CM | POA: Diagnosis not present

## 2017-11-07 NOTE — Telephone Encounter (Signed)
This encounter was created in error - please disregard.

## 2017-11-14 DIAGNOSIS — M069 Rheumatoid arthritis, unspecified: Secondary | ICD-10-CM | POA: Diagnosis not present

## 2017-11-19 DIAGNOSIS — N2889 Other specified disorders of kidney and ureter: Secondary | ICD-10-CM | POA: Diagnosis not present

## 2017-11-19 LAB — BUN: BUN: 12 mg/dL (ref 7–25)

## 2017-11-19 LAB — CREATININE WITH EST GFR
Creat: 0.76 mg/dL (ref 0.50–0.99)
GFR, Est African American: 98 mL/min/{1.73_m2} (ref >=60)
GFR, Est Non African American: 85 mL/min/{1.73_m2} (ref >=60)

## 2017-11-21 ENCOUNTER — Ambulatory Visit
Admission: RE | Admit: 2017-11-21 | Discharge: 2017-11-21 | Disposition: A | Discharge status: Home / Self Care | Payer: Medicare Other | Source: Ambulatory Visit | Attending: Radiology | Admitting: Radiology

## 2017-11-21 ENCOUNTER — Encounter: Payer: Self-pay | Admitting: Radiology

## 2017-11-21 DIAGNOSIS — N2889 Other specified disorders of kidney and ureter: Secondary | ICD-10-CM

## 2017-11-21 DIAGNOSIS — C642 Malignant neoplasm of left kidney, except renal pelvis: Secondary | ICD-10-CM | POA: Diagnosis not present

## 2017-11-21 HISTORY — PX: IR RADIOLOGIST EVAL & MGMT: IMG5224

## 2017-11-21 NOTE — Progress Notes (Signed)
Chief Complaint: Status post biopsy and percutaneous cryoablation of a left renal carcinoma on 10/23/2017.   History of Present Illness: [name] is a 62 y.o. female who returns for initial follow-up 4 weeks after cryoablation of a left renal neoplasm.  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  Cryoablation was uncomplicated and the patient was discharged after overnight observation with stable renal function and no symptoms.  Since the procedure, she has restarted her immunosuppressant medication for rheumatoid arthritis.  She denies any urinary symptoms, fever or chills.  She has had no abdominal or flank pain.  Past Medical History:  Diagnosis Date  . Atrial flutter ([place])   . Chronic gout 2008  . Diverticulitis of large intestine with perforation   . Dysrhythmia   . Fibroid   . Hearing aid worn    pt wears bilateral hearing aids  . Hernia 4/08  . Hx of adenomatous polyp of colon 11/13/2016  . Hypertension   . Legally blind    since pt was a teenager  . Marfan's syndrome affecting skin    with scolosis  . Osteoporosis   . Rheumatoid arthritis [facility])    sees Dr. Gavin Pound   . SBO (small bowel obstruction) ([place]) 01/05/2013  . Small bowel obstruction due to adhesions ([place])   . Status post bunionectomy 1/10   bilateral   . Stroke ([place]) 2/08  . Substance abuse ([place])    recovering alcoholic K24 years   . 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 ABLATION  01/04/14   atrial flutter ablation (2 circuits) by Dr Lovena Le  . ATRIAL FLUTTER ABLATION N/A 01/04/2014   Procedure: ATRIAL FLUTTER ABLATION;  Surgeon: Evans Lance, MD;  Location: [facility] CATH LAB;  Service: Cardiovascular;  Laterality: N/A;  . BUNIONECTOMY Bilateral 1/10  . COLON SURGERY     colectomy  . COLOSTOMY  11/27/2012  . HERNIA REPAIR    .  IR RADIOLOGIST EVAL & MGMT  10/01/2017  . IR RADIOLOGIST EVAL & MGMT  11/21/2017  . LAPAROSCOPIC ABDOMINAL EXPLORATION N/A 01/06/2013   Procedure: LAPAROSCOPIC ABDOMINAL EXPLORATION;  Surgeon: Adin Hector, MD;  Location: [facility] ORS;  Service: General;  Laterality: N/A;  . LAPAROSCOPIC LYSIS OF ADHESIONS N/A 01/06/2013   Procedure: LAPAROSCOPIC LYSIS OF ADHESIONS/ INTEROTOMY REPAIR;  Surgeon: Adin Hector, MD;  Location: [facility] ORS;  Service: General;  Laterality: N/A;  . LAPAROTOMY N/A 11/27/2012   Procedure: EXPLORATORY LAPAROTOMY SIGMOID COLECTOMY, COLOSTOMY;  Surgeon: Madilyn Hook, DO;  Location: [facility] ORS;  Service: General;  Laterality: N/A;  . RADIOLOGY WITH ANESTHESIA Left 10/23/2017   Procedure: CT RENAL CRYO AND BIOPSY;  Surgeon: Aletta Edouard, MD;  Location: [facility] ORS;  Service: Radiology;  Laterality: Left;  . TOTAL KNEE ARTHROPLASTY Bilateral 6/08    Allergies: Spinach  Medications: Prior to Admission medications   Medication Sig Start Date End Date Taking? Authorizing Provider  Abatacept (ORENCIA) 125 MG/ML SOSY Inject 125 mg into the skin every Monday.     [provider]  albuterol (PROVENTIL HFA;VENTOLIN HFA) 108 (90 BASE) MCG/ACT inhaler Inhale 1 puff into the lungs every 4 (four) hours as needed for wheezing or shortness of breath. 12/29/13   [name], MD  allopurinol (ZYLOPRIM) 100 MG tablet take 1 tablet by mouth once daily  08/13/17   [name], MD  amLODipine (NORVASC) 10 MG tablet take 1 tablet by mouth once daily 08/13/17   [name], MD  aspirin EC 81 MG tablet Take 1 tablet (81 mg total) by mouth daily. 05/11/14   Verlee Monte, MD  CALCIUM PO Take 1 tablet by mouth 2 (two) times daily.    [provider]  cetirizine (ZYRTEC) 10 MG tablet Take 1 tablet (10 mg total) by mouth daily. 11/17/14   [name], MD  chlorhexidine (PERIDEX) 0.12 % solution Use as directed 10 mLs in the mouth or throat daily.  07/25/17   [provider]    Cholecalciferol (VITAMIN D) 2000 units CAPS Take 2,000 Units by mouth daily.    [provider]  ciprofloxacin-dexamethasone (CIPRODEX) otic suspension Place 4 drops into both ears 2 (two) times daily as needed. Patient taking differently: Place 4 drops into both ears 2 (two) times daily as needed (ear pain).  08/30/16   [name], MD  diclofenac sodium (VOLTAREN) 1 % GEL Apply 1 application topically 4 (four) times daily as needed (arthritis).    [provider]  docusate sodium (COLACE) 100 MG capsule Take 100 mg by mouth 2 (two) times daily.    [provider]  esomeprazole (NEXIUM) 40 MG capsule Take 1 capsule (40 mg total) by mouth daily. 10/21/17   [name], MD  folic acid (FOLVITE) 606 MCG tablet Take 400 mcg by mouth daily.    [provider]  HYDROcodone-acetaminophen (NORCO) 10-325 MG tablet Take 1 tablet by mouth every 6 (six) hours as needed for moderate pain. 12/21/17 01/20/18  [name], MD  ketoconazole (NIZORAL) 2 % cream Apply 1 application topically daily as needed for irritation.    [provider]  megestrol (MEGACE ES) 625 MG/5ML suspension take 5 milliliters by mouth twice a day Patient taking differently: take 5 milliliters by mouth three times daily before meals as needed for appetite 08/28/17   [name], MD  Methotrexate Sodium (METHOTREXATE, PF,) 50 MG/2ML injection Inject 0.5 mLs into the muscle every Monday. 05/14/16   [provider]  metoCLOPramide (REGLAN) 10 MG tablet Take 1 tablet (10 mg total) by mouth every 6 (six) hours. 08/19/17   [name], MD  Multiple Vitamin (MULTIVITAMIN WITH MINERALS) TABS tablet Take 1 tablet by mouth daily.    [provider]  polyethylene glycol (MIRALAX / GLYCOLAX) packet take 17 gram by mouth once daily Patient taking differently: take 17 gram by mouth once daily as needed for constipation 04/15/17   [name], MD  tiZANidine (ZANAFLEX) 4 MG tablet  take 1 tablet by mouth twice a day 04/02/17   [name], MD  traMADol Veatrice Bourbon) 50 MG tablet take 1-2 tablets by mouth every 6 hours if needed 11/04/17   [name], MD  triamcinolone (KENALOG) 0.025 % cream Apply 1 application topically daily as needed (itching).    [provider]  vitamin B-12 (CYANOCOBALAMIN) 500 MCG tablet Take 500 mcg by mouth daily.    [provider]  vitamin C (ASCORBIC ACID) 500 MG tablet Take 500 mg by mouth daily.     [provider]     Family History  Problem Relation Age of Onset  . Heart attack Neg Hx   . Colon cancer Neg Hx     Social History   Socioeconomic History  . Marital status: Single    Spouse  name: Not on file  . Number of children: 1  . Years of education: Not on file  . Highest education level: Not on file  Occupational History  . Not on file  Social Needs  . Financial resource strain: Not on file  . Food insecurity:    Worry: Not on file    Inability: Not on file  . Transportation needs:    Medical: Not on file    Non-medical: Not on file  Tobacco Use  . Smoking status: Former Smoker    Packs/day: 1.00    Types: Cigarettes    Last attempt to quit: 11/27/2012    Years since quitting: 4.9  . Smokeless tobacco: Never Used  Substance and Sexual Activity  . Alcohol use: No    Alcohol/week: 0.0 oz    Comment: recovering alcoholic sober since 8841  . Drug use: No  . Sexual activity: Not Currently    Partners: Male    Birth control/protection: None  Lifestyle  . Physical activity:    Days per week: Not on file    Minutes per session: Not on file  . Stress: Not on file  Relationships  . Social connections:    Talks on phone: Not on file    Gets together: Not on file    Attends religious service: Not on file    Active member of club or organization: Not on file    Attends meetings of clubs or organizations: Not on file    Relationship status: Not on file  Other Topics Concern  . Not on file    Social History Narrative  . Not on file    ECOG Status: 0 - Asymptomatic  Review of Systems: A 12 point ROS discussed and pertinent positives are indicated in the HPI above.  All other systems are negative.  Review of Systems  Constitutional: Negative.   Respiratory: Negative.   Cardiovascular: Negative.   Gastrointestinal: Negative.   Genitourinary: Negative.   Neurological: Negative.     Vital Signs: BP (!) 149/72   Pulse 79   Temp 98.4 F (36.9 C) (Oral)   Resp 15   LMP 06/25/2006 (LMP Unknown)   SpO2 100%   Physical Exam  Constitutional: She is oriented to person, place, and time. No distress.  Abdominal: Soft. She exhibits no distension and no mass. There is no tenderness. There is no rebound and no guarding.  Musculoskeletal: She exhibits no edema.  Neurological: She is alert and oriented to person, place, and time.  Skin: She is not diaphoretic.  Vitals reviewed.   Imaging: Ct Guide Tissue Ablation  Result Date: 10/23/2017 CLINICAL DATA:  Left renal mass. The patient presents for CT-guided biopsy and ablation of the mass. EXAM: CT-GUIDED CORE BIOPSY OF LEFT RENAL MASS CT-GUIDED PERCUTANEOUS CRYOABLATION OF LEFT RENAL MASS ANESTHESIA/SEDATION: General MEDICATIONS: 2 G IV ANCEF. The antibiotic was administered in an appropriate time interval prior to needle puncture of the skin. CONTRAST:  47mL ISOVUE-300 used for hydrodissection PROCEDURE: The procedure, risks, benefits, and alternatives were explained to the patient. Questions regarding the procedure were encouraged and answered. The patient understands and consents to the procedure. A time-out was performed prior to initiating the procedure. The patient was placed under general anesthesia. Initial unenhanced CT was performed in a prone position to localize the left renal mass. The left flank region was prepped with chlorhexidine in a sterile fashion, and a sterile drape was applied covering the operative field. A  sterile gown and sterile  gloves were used for the procedure. A 17 gauge trocar needle was advanced into the left renal mass. Core biopsy was performed with an 18 gauge automated core biopsy device. Under CT guidance, three separate Galil Ice Rod percutaneous cryoablation probes were advanced into the left renal mass. Probe positioning was confirmed by CT prior to cryoablation. Hydrodissection was performed prior to cryoablation. A total volume of 500 mL of sterile saline was mixed with 10 mL of contrast to create hydrodissection fluid. Fluid was injected during the procedure through 2 separate 22 gauge spinal needles. Cryoablation was performed through the 3 probes. Initial 9 minute cycle of cryoablation was performed. This was followed by a thaw cycle. A second 9 minute cycle of cryoablation was then performed. During ablation, periodic CT imaging was performed to monitor ice ball formation and morphology. After active thaw, the cryoablation probes were removed. COMPLICATIONS: None FINDINGS: Exophytic tumor emanating from the posterior cortex of the upper left kidney measures approximately 2.6 cm in greatest diameter. Solid tissue was obtained with core biopsy. Hydrodissection was performed along the medial and lateral aspects of the tumor due to close proximity to the vertebral column as well as ribs. A total volume of 500 mL of hydrodissection fluid was utilized during the procedure. IMPRESSION: CT guided core biopsy and cryoablation of a 2.6 cm left renal mass. The patient will be observed overnight. Initial follow-up will be performed in approximately 4 weeks. Electronically Signed   By: Aletta Edouard M.D.   On: 10/23/2017 13:33   Ct Biopsy  Result Date: 10/23/2017 CLINICAL DATA:  Left renal mass. The patient presents for CT-guided biopsy and ablation of the mass. EXAM: CT-GUIDED CORE BIOPSY OF LEFT RENAL MASS CT-GUIDED PERCUTANEOUS CRYOABLATION OF LEFT RENAL MASS ANESTHESIA/SEDATION: General MEDICATIONS:  2 G IV ANCEF. The antibiotic was administered in an appropriate time interval prior to needle puncture of the skin. CONTRAST:  4mL ISOVUE-300 used for hydrodissection PROCEDURE: The procedure, risks, benefits, and alternatives were explained to the patient. Questions regarding the procedure were encouraged and answered. The patient understands and consents to the procedure. A time-out was performed prior to initiating the procedure. The patient was placed under general anesthesia. Initial unenhanced CT was performed in a prone position to localize the left renal mass. The left flank region was prepped with chlorhexidine in a sterile fashion, and a sterile drape was applied covering the operative field. A sterile gown and sterile gloves were used for the procedure. A 17 gauge trocar needle was advanced into the left renal mass. Core biopsy was performed with an 18 gauge automated core biopsy device. Under CT guidance, three separate Galil Ice Rod percutaneous cryoablation probes were advanced into the left renal mass. Probe positioning was confirmed by CT prior to cryoablation. Hydrodissection was performed prior to cryoablation. A total volume of 500 mL of sterile saline was mixed with 10 mL of contrast to create hydrodissection fluid. Fluid was injected during the procedure through 2 separate 22 gauge spinal needles. Cryoablation was performed through the 3 probes. Initial 9 minute cycle of cryoablation was performed. This was followed by a thaw cycle. A second 9 minute cycle of cryoablation was then performed. During ablation, periodic CT imaging was performed to monitor ice ball formation and morphology. After active thaw, the cryoablation probes were removed. COMPLICATIONS: None FINDINGS: Exophytic tumor emanating from the posterior cortex of the upper left kidney measures approximately 2.6 cm in greatest diameter. Solid tissue was obtained with core biopsy. Hydrodissection was performed along  the medial and  lateral aspects of the tumor due to close proximity to the vertebral column as well as ribs. A total volume of 500 mL of hydrodissection fluid was utilized during the procedure. IMPRESSION: CT guided core biopsy and cryoablation of a 2.6 cm left renal mass. The patient will be observed overnight. Initial follow-up will be performed in approximately 4 weeks. Electronically Signed   By: Aletta Edouard M.D.   On: 10/23/2017 13:33   Ir Radiologist Eval & Mgmt  Result Date: 11/21/2017 Please refer to notes tab for details about interventional procedure. (Op Note)   Labs:  CBC: Recent Labs    10/22/17 1117 10/24/17 0350  WBC 6.1 8.1  HGB 12.1 9.7*  HCT 37.1 29.9*  PLT 296 239    COAGS: Recent Labs    10/22/17 1117  INR 0.88    BMP: Recent Labs    10/22/17 1117 10/24/17 0350 11/19/17 1024  NA 144 143  --   K 4.6 3.5  --   CL 109 112*  --   CO2 24 23  --   GLUCOSE 94 119*  --   BUN 11 12 12   CALCIUM 9.9 8.3*  --   CREATININE 0.79 0.74 0.76  GFRNONAA >60 >60 85  GFRAA >60 >60 98    LIVER FUNCTION TESTS: No results for input(s): BILITOT, AST, ALT, ALKPHOS, PROT, ALBUMIN in the last 8760 hours.   Assessment and Plan:  [name] is doing very well after cryoablation of a left renal papillary carcinoma.  Renal function after the procedure is stable and within normal limits including recent check of renal function just 2 days ago.  I recommended a follow-up CT of the abdomen in August, 3 months post ablation.  I will meet with her and her daughter after the CT to review findings.   Electronically SignedAletta Edouard T 11/21/2017, 12:23 PM     I spent a total of 15 Minutes in face to face in clinical consultation, greater than 50% of which was counseling/coordinating care post ablation of a left renal papillary carcinoma.

## 2017-12-14 ENCOUNTER — Other Ambulatory Visit: Payer: Self-pay | Admitting: Family Medicine

## 2017-12-16 DIAGNOSIS — M069 Rheumatoid arthritis, unspecified: Secondary | ICD-10-CM | POA: Diagnosis not present

## 2017-12-16 NOTE — Telephone Encounter (Signed)
Last OV 10/21/2017   Last refilled 04/02/2017 disp 90 with 5 refills   Sent to PCP for approval

## 2017-12-17 ENCOUNTER — Telehealth: Payer: Self-pay | Admitting: *Deleted

## 2017-12-17 NOTE — Telephone Encounter (Signed)
Sent to PCP as an FYI  

## 2017-12-17 NOTE — Telephone Encounter (Signed)
Noted  

## 2017-12-17 NOTE — Telephone Encounter (Signed)
Copied from [place] [id number]. Topic: Referral - Status >> Dec 17, 2017 12:24 PM Scherrie Gerlach wrote: Reason for CRM: Lars Mage with [facility] states they will not be able to see the pt for this referral.  Feels pt will be better served at another bigger academic facility  [facility] states they tried to refer the pt back in 2017, to Dr Henoford in [place], [place].  They made an appt for her,(11/2015) but pt never followed up.  This dr is very good and was booked out several months. Lars Mage is going to fax over the dr office notes, so you can see they did try to work with this pt.

## 2017-12-23 DIAGNOSIS — Z933 Colostomy status: Secondary | ICD-10-CM | POA: Diagnosis not present

## 2018-01-06 DIAGNOSIS — C642 Malignant neoplasm of left kidney, except renal pelvis: Secondary | ICD-10-CM | POA: Diagnosis not present

## 2018-01-06 DIAGNOSIS — M0579 Rheumatoid arthritis with rheumatoid factor of multiple sites without organ or systems involvement: Secondary | ICD-10-CM | POA: Diagnosis not present

## 2018-01-06 DIAGNOSIS — M2550 Pain in unspecified joint: Secondary | ICD-10-CM | POA: Diagnosis not present

## 2018-01-06 DIAGNOSIS — M1A09X0 Idiopathic chronic gout, multiple sites, without tophus (tophi): Secondary | ICD-10-CM | POA: Diagnosis not present

## 2018-01-06 DIAGNOSIS — Z79899 Other long term (current) drug therapy: Secondary | ICD-10-CM | POA: Diagnosis not present

## 2018-01-13 DIAGNOSIS — M069 Rheumatoid arthritis, unspecified: Secondary | ICD-10-CM | POA: Diagnosis not present

## 2018-01-22 DIAGNOSIS — Z933 Colostomy status: Secondary | ICD-10-CM | POA: Diagnosis not present

## 2018-01-27 ENCOUNTER — Other Ambulatory Visit: Payer: Self-pay | Admitting: *Deleted

## 2018-01-27 ENCOUNTER — Other Ambulatory Visit (HOSPITAL_COMMUNITY): Payer: Self-pay | Admitting: Interventional Radiology

## 2018-01-27 DIAGNOSIS — C649 Malignant neoplasm of unspecified kidney, except renal pelvis: Secondary | ICD-10-CM

## 2018-01-28 ENCOUNTER — Ambulatory Visit (INDEPENDENT_AMBULATORY_CARE_PROVIDER_SITE_OTHER): Payer: Medicare Other | Admitting: Family Medicine

## 2018-01-28 ENCOUNTER — Telehealth: Payer: Self-pay | Admitting: Family Medicine

## 2018-01-28 ENCOUNTER — Encounter: Payer: Self-pay | Admitting: Family Medicine

## 2018-01-28 VITALS — BP 160/90 | HR 88 | Temp 98.7°F

## 2018-01-28 DIAGNOSIS — F1190 Opioid use, unspecified, uncomplicated: Secondary | ICD-10-CM

## 2018-01-28 DIAGNOSIS — M069 Rheumatoid arthritis, unspecified: Secondary | ICD-10-CM | POA: Diagnosis not present

## 2018-01-28 MED ORDER — HYDROCODONE-ACETAMINOPHEN 10-325 MG PO TABS: 1.0000 | ORAL_TABLET | Freq: Four times a day (QID) | ORAL | 0 refills | Status: DC | PRN

## 2018-01-28 MED ORDER — ESOMEPRAZOLE MAGNESIUM 40 MG PO CPDR: 40.0000 mg | DELAYED_RELEASE_CAPSULE | Freq: Every day | ORAL | 11 refills | Status: DC

## 2018-01-28 NOTE — Telephone Encounter (Signed)
Copied from [place] [id number]. Topic: Quick Communication - See Telephone Encounter >> Jan 28, 2018  1:15 PM Berneta Levins wrote: CRM for notification. See Telephone encounter for: 01/28/18. Cori Razor with [employer] calling with pt on the phone.  States that Dr. Sarajane Jews usually places her order for colostomy bags.   [employer] reports that pt only gets 21 a month and she needs more than that - pt feels like she needs to have 30 a month.  Pt states she gets this filled from [facility].   Pt states she mentioned this at her office visit but didn't get an answer to what was going to happen. Cori Razor states that she left a voicemail on 07/26. Pt can be reached at [phone]

## 2018-01-28 NOTE — Progress Notes (Signed)
   Subjective:    Patient ID: [name], female    DOB: [birth year], 62 y.o.   MRN: [id number]  HPI Here for pain management. She has been doing well. She had a cryoablation of a left renal cell carcinoma on 10-23-17. She will follow up with Dr. Vernie Shanks tomorrow and with Dr. Bethel Born on 02-13-18. She will have a follow up CT scan on 02-10-18.  Indication for chronic opioid: rheumatoid arthritis  Medication and dose: Norco 10-325  # pills per month: 120 Last UDS date: 07-08-17 Opioid Treatment Agreement signed (Y/N): 10-21-17  Opioid Treatment Agreement last reviewed with patient:  01-28-18 [place] reviewed this encounter (include red flags):  01-28-18     Review of Systems  Constitutional: Negative.   Respiratory: Negative.   Cardiovascular: Negative.   Musculoskeletal: Positive for arthralgias.  Neurological: Negative.        Objective:   Physical Exam  Constitutional: She is oriented to person, place, and time. She appears well-developed and well-nourished.  [name]   Cardiovascular: Normal rate, regular rhythm, normal heart sounds and intact distal pulses.  Pulmonary/Chest: Effort normal and breath sounds normal.  Neurological: She is alert and oriented to person, place, and time.          Assessment & Plan:  Pain management, meds were refilled.  [name], MD

## 2018-01-28 NOTE — Telephone Encounter (Signed)
Contact the [employer] representative and change the order to her getting 30 colostomy bags a month with refills

## 2018-01-28 NOTE — Telephone Encounter (Signed)
Sent to PCP to advise 

## 2018-01-29 NOTE — Telephone Encounter (Signed)
I reached out to [facility] and they stated that they are unable to give the pt 30 bags because her insurance will only cover for 20 bags. Plus even with her secondary insurance Medicaid they will not cover th 30 bags as well. The suggested for the pt to call them back [employer] to discuss her other options she could switch to a larger bag that could equal or be equivalent t to having 30 of the current type of colostomy bags that she has.   Called pt and left a detailed voice mail explaining the situation and left [employer] phone number and our call back number if needed.

## 2018-02-03 DIAGNOSIS — R350 Frequency of micturition: Secondary | ICD-10-CM | POA: Diagnosis not present

## 2018-02-03 DIAGNOSIS — C642 Malignant neoplasm of left kidney, except renal pelvis: Secondary | ICD-10-CM | POA: Diagnosis not present

## 2018-02-03 DIAGNOSIS — R3915 Urgency of urination: Secondary | ICD-10-CM | POA: Diagnosis not present

## 2018-02-10 ENCOUNTER — Ambulatory Visit
Admission: RE | Admit: 2018-02-10 | Discharge: 2018-02-10 | Disposition: A | Discharge status: Home / Self Care | Payer: Medicare Other | Source: Ambulatory Visit | Attending: Interventional Radiology | Admitting: Interventional Radiology

## 2018-02-10 DIAGNOSIS — C649 Malignant neoplasm of unspecified kidney, except renal pelvis: Secondary | ICD-10-CM | POA: Diagnosis not present

## 2018-02-10 DIAGNOSIS — N289 Disorder of kidney and ureter, unspecified: Secondary | ICD-10-CM | POA: Diagnosis not present

## 2018-02-10 MED ORDER — IOPAMIDOL (ISOVUE-300) INJECTION 61%
100.0000 mL | Freq: Once | INTRAVENOUS | Status: AC | PRN
  Administered 2018-02-10: 100 mL via INTRAVENOUS

## 2018-02-13 ENCOUNTER — Encounter: Payer: Self-pay | Admitting: Radiology

## 2018-02-13 ENCOUNTER — Ambulatory Visit
Admission: RE | Admit: 2018-02-13 | Discharge: 2018-02-13 | Disposition: A | Discharge status: Home / Self Care | Payer: Medicare Other | Source: Ambulatory Visit | Attending: Interventional Radiology | Admitting: Interventional Radiology

## 2018-02-13 DIAGNOSIS — C649 Malignant neoplasm of unspecified kidney, except renal pelvis: Secondary | ICD-10-CM

## 2018-02-13 DIAGNOSIS — C642 Malignant neoplasm of left kidney, except renal pelvis: Secondary | ICD-10-CM | POA: Diagnosis not present

## 2018-02-13 HISTORY — PX: IR RADIOLOGIST EVAL & MGMT: IMG5224

## 2018-02-19 DIAGNOSIS — M069 Rheumatoid arthritis, unspecified: Secondary | ICD-10-CM | POA: Diagnosis not present

## 2018-02-20 DIAGNOSIS — M069 Rheumatoid arthritis, unspecified: Secondary | ICD-10-CM | POA: Diagnosis not present

## 2018-02-25 DIAGNOSIS — Z933 Colostomy status: Secondary | ICD-10-CM | POA: Diagnosis not present

## 2018-03-05 NOTE — Addendum Note (Signed)
Encounter addended by: Aletta Edouard, MD on: 03/05/2018 5:28 PM  Actions taken: Sign clinical note

## 2018-03-05 NOTE — Progress Notes (Signed)
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  History of Present Illness: [name] is a 62 y.o. female returns for follow-up and is now just over 3 months post cryoablation of a biopsy-proven papillary carcinoma of the left kidney.  She did extremely well following the procedure and is asymptomatic with no urinary symptoms.  Renal function has been normal.  Past Medical History:  Diagnosis Date  . Atrial flutter ([place])   . Chronic gout 2008  . Diverticulitis of large intestine with perforation   . Dysrhythmia   . Fibroid   . Hearing aid worn    pt wears bilateral hearing aids  . Hernia 4/08  . Hx of adenomatous polyp of colon 11/13/2016  . Hypertension   . Legally blind    since pt was a teenager  . Marfan's syndrome affecting skin    with scolosis  . Osteoporosis   . Rheumatoid arthritis [facility])    sees Dr. Gavin Pound   . SBO (small bowel obstruction) ([place]) 01/05/2013  . Small bowel obstruction due to adhesions ([place])   . Status post bunionectomy 1/10   bilateral   . Stroke ([place]) 2/08  . Substance abuse ([place])    recovering alcoholic P82 years   . 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 ABLATION  01/04/14   atrial flutter ablation (2 circuits) by Dr Lovena Le  . ATRIAL FLUTTER ABLATION N/A 01/04/2014   Procedure: ATRIAL FLUTTER ABLATION;  Surgeon: Evans Lance, MD;  Location: [facility] CATH LAB;  Service: Cardiovascular;  Laterality: N/A;  . BUNIONECTOMY Bilateral 1/10  . COLON SURGERY     colectomy  . COLOSTOMY  11/27/2012  . HERNIA REPAIR    . IR RADIOLOGIST EVAL & MGMT  10/01/2017  . IR RADIOLOGIST EVAL & MGMT  11/21/2017  . IR RADIOLOGIST EVAL & MGMT  02/13/2018  . LAPAROSCOPIC ABDOMINAL EXPLORATION N/A 01/06/2013   Procedure: LAPAROSCOPIC ABDOMINAL EXPLORATION;  Surgeon: Adin Hector, MD;  Location: [facility] ORS;  Service: General;  Laterality: N/A;  .  LAPAROSCOPIC LYSIS OF ADHESIONS N/A 01/06/2013   Procedure: LAPAROSCOPIC LYSIS OF ADHESIONS/ INTEROTOMY REPAIR;  Surgeon: Adin Hector, MD;  Location: [facility] ORS;  Service: General;  Laterality: N/A;  . LAPAROTOMY N/A 11/27/2012   Procedure: EXPLORATORY LAPAROTOMY SIGMOID COLECTOMY, COLOSTOMY;  Surgeon: Madilyn Hook, DO;  Location: [facility] ORS;  Service: General;  Laterality: N/A;  . RADIOLOGY WITH ANESTHESIA Left 10/23/2017   Procedure: CT RENAL CRYO AND BIOPSY;  Surgeon: Aletta Edouard, MD;  Location: [facility] ORS;  Service: Radiology;  Laterality: Left;  . TOTAL KNEE ARTHROPLASTY Bilateral 6/08    Allergies: Spinach  Medications: Prior to Admission medications   Medication Sig Start Date End Date Taking? Authorizing Provider  Abatacept (ORENCIA) 125 MG/ML SOSY Inject 125 mg into the skin every Monday.     [provider]  albuterol (PROVENTIL HFA;VENTOLIN HFA) 108 (90 BASE) MCG/ACT inhaler Inhale 1 puff into the lungs every 4 (four) hours as needed for wheezing or shortness of breath. 12/29/13   [name], MD  allopurinol (ZYLOPRIM) 100 MG tablet take 1 tablet by mouth once daily 08/13/17   [name], MD  amLODipine (NORVASC) 10 MG tablet take 1 tablet by mouth once daily 08/13/17   [name], MD  aspirin EC 81 MG tablet Take 1 tablet (81 mg total) by mouth daily.  05/11/14   Verlee Monte, MD  CALCIUM PO Take 1 tablet by mouth 2 (two) times daily.    [provider]  cetirizine (ZYRTEC) 10 MG tablet Take 1 tablet (10 mg total) by mouth daily. 11/17/14   [name], MD  chlorhexidine (PERIDEX) 0.12 % solution Use as directed 10 mLs in the mouth or throat daily.  07/25/17   [provider]  Cholecalciferol (VITAMIN D) 2000 units CAPS Take 2,000 Units by mouth daily.    [provider]  ciprofloxacin-dexamethasone (CIPRODEX) otic suspension Place 4 drops into both ears 2 (two) times daily as needed. Patient taking differently: Place 4 drops into both ears 2  (two) times daily as needed (ear pain).  08/30/16   [name], MD  diclofenac sodium (VOLTAREN) 1 % GEL Apply 1 application topically 4 (four) times daily as needed (arthritis).    [provider]  docusate sodium (COLACE) 100 MG capsule Take 100 mg by mouth 2 (two) times daily.    [provider]  esomeprazole (NEXIUM) 40 MG capsule Take 1 capsule (40 mg total) by mouth daily. 01/28/18   [name], MD  folic acid (FOLVITE) 009 MCG tablet Take 400 mcg by mouth daily.    [provider]  HYDROcodone-acetaminophen (NORCO) 10-325 MG tablet Take 1 tablet by mouth every 6 (six) hours as needed for moderate pain. 03/30/18 04/29/18  [name], MD  ketoconazole (NIZORAL) 2 % cream Apply 1 application topically daily as needed for irritation.    [provider]  megestrol (MEGACE ES) 625 MG/5ML suspension take 5 milliliters by mouth twice a day Patient taking differently: take 5 milliliters by mouth three times daily before meals as needed for appetite 08/28/17   [name], MD  Methotrexate Sodium (METHOTREXATE, PF,) 50 MG/2ML injection Inject 0.5 mLs into the muscle every Monday. 05/14/16   [provider]  metoCLOPramide (REGLAN) 10 MG tablet Take 1 tablet (10 mg total) by mouth every 6 (six) hours. 08/19/17   [name], MD  Multiple Vitamin (MULTIVITAMIN WITH MINERALS) TABS tablet Take 1 tablet by mouth daily.    [provider]  polyethylene glycol (MIRALAX / GLYCOLAX) packet take 17 gram by mouth once daily Patient taking differently: take 17 gram by mouth once daily as needed for constipation 04/15/17   [name], MD  tiZANidine (ZANAFLEX) 4 MG tablet TAKE 1 TABLET BY MOUTH TWICE DAILY 12/16/17   [name], MD  traMADol Veatrice Bourbon) 50 MG tablet take 1-2 tablets by mouth every 6 hours if needed 11/04/17   [name], MD  triamcinolone (KENALOG) 0.025 % cream Apply 1 application topically daily as needed (itching).    [provider]  vitamin B-12 (CYANOCOBALAMIN) 500 MCG tablet Take 500 mcg by mouth daily.    [provider]  vitamin C (ASCORBIC ACID) 500 MG tablet Take 500 mg by mouth daily.     [provider]     Family History  Problem Relation Age of Onset  . Heart attack Neg Hx   . Colon cancer Neg Hx     Social History   Socioeconomic History  . Marital status: Single    Spouse name: Not on file  . Number of children: 1  . Years of education: Not on file  . Highest education level: Not on file  Occupational History  . Not on file  Social Needs  . Financial resource strain: Not on  file  . Food insecurity:    Worry: Not on file    Inability: Not on file  . Transportation needs:    Medical: Not on file    Non-medical: Not on file  Tobacco Use  . Smoking status: Former Smoker    Packs/day: 1.00    Types: Cigarettes    Last attempt to quit: 11/27/2012    Years since quitting: 5.2  . Smokeless tobacco: Never Used  Substance and Sexual Activity  . Alcohol use: No    Alcohol/week: 0.0 standard drinks    Comment: recovering alcoholic sober since 1610  . Drug use: No  . Sexual activity: Not Currently    Partners: Male    Birth control/protection: None  Lifestyle  . Physical activity:    Days per week: Not on file    Minutes per session: Not on file  . Stress: Not on file  Relationships  . Social connections:    Talks on phone: Not on file    Gets together: Not on file    Attends religious service: Not on file    Active member of club or organization: Not on file    Attends meetings of clubs or organizations: Not on file    Relationship status: Not on file  Other Topics Concern  . Not on file  Social History Narrative  . Not on file    Review of Systems: A 12 point ROS discussed and pertinent positives are indicated in the HPI above.  All other systems are negative.  Review of Systems  Constitutional: Negative.   Respiratory: Negative.     Cardiovascular: Negative.   Gastrointestinal: Negative.   Genitourinary: Negative.   Musculoskeletal:       Chronically wheelchair bound.  Joint deformities from RA.  Neurological: Negative.     Vital Signs: BP (!) 170/91   Pulse 82   Temp 98.2 F (36.8 C) (Oral)   Resp 14   Ht '5\' 8"'  (1.727 m)   Wt 68 kg   LMP 06/25/2006 (LMP Unknown)   SpO2 98%   BMI 22.81 kg/m   Physical Exam  Constitutional: She is oriented to person, place, and time. No distress.  Abdominal: Soft. She exhibits no distension. There is no tenderness. There is no rebound and no guarding.  Neurological: She is alert and oriented to person, place, and time.  Skin: She is not diaphoretic.  Vitals reviewed.   Imaging: Ct Abdomen W Wo Contrast  Result Date: 02/10/2018 CLINICAL DATA:  62 year old female status post cryoablation in May 2019 for renal cancer. Follow-up study. EXAM: CT ABDOMEN WITHOUT AND WITH CONTRAST TECHNIQUE: Multidetector CT imaging of the abdomen was performed following the standard protocol before and following the bolus administration of intravenous contrast. CONTRAST:  142m ISOVUE-300 IOPAMIDOL (ISOVUE-300) INJECTION 61% COMPARISON:  CT the abdomen and pelvis 06/19/2017. FINDINGS: Lower chest: 4 mm subpleural nodule in the periphery of the lateral segment of the right middle lobe (axial image 5 of series 22), unchanged compared to prior study 06/20/2015, considered definitively benign (presumably a subpleural lymph node). Aortic atherosclerosis. Hepatobiliary: 2 low-attenuation lesions in the liver, compatible with simple cysts, largest of which measures 3.2 x 3.5 cm in the central aspect of segments 4A and 4B. No other suspicious hepatic lesions. Small calcified granuloma. No intra or extrahepatic biliary ductal dilatation. Gallbladder is unremarkable in appearance. Pancreas: No pancreatic mass. No pancreatic ductal dilatation. No pancreatic or peripancreatic fluid or inflammatory changes.  Spleen: Unremarkable. Adrenals/Urinary Tract: When compared  to prior examinations, the previously noted lesion in the posterior aspect of the interpolar region of the left kidney now demonstrates postprocedural changes of prior cryoablation. The postprocedural region measures approximately 2.6 cm in diameter and is intermediate attenuation without definite enhancement or residual soft tissue. No other suspicious renal lesions are noted. No hydroureteronephrosis in the visualized portions of the abdomen. Bilateral adrenal glands are normal in appearance. Stomach/Bowel: Normal appearance of the stomach. No pathologic dilatation of small bowel or colon. Colostomy in the left lower quadrant. Numerous colonic diverticulae are noted, without surrounding inflammatory changes to suggest an acute diverticulitis at this time. Infraumbilical ventral hernia incompletely imaged. Vascular/Lymphatic: Aortic atherosclerosis, without evidence of aneurysm or dissection in the abdominal vasculature. No lymphadenopathy noted in the abdomen. Other: No significant volume of ascites and no pneumoperitoneum noted in the visualized portions of the abdomen. Musculoskeletal: There are no aggressive appearing lytic or blastic lesions noted in the visualized portions of the skeleton. Severe levoscoliosis of the thoracolumbar spine. IMPRESSION: 1. Expected postprocedural appearance of the treated lesion in the posterior aspect of the interpolar region of the left kidney without findings to suggest residual/recurrent disease or metastatic disease in the abdomen. 2. Aortic atherosclerosis. 3. Colonic diverticulosis. 4. Additional incidental findings, as above. Electronically Signed   By: Vinnie Langton M.D.   On: 02/10/2018 13:57   Ir Radiologist Eval & Mgmt  Result Date: 02/13/2018 Please refer to notes tab for details about interventional procedure. (Op Note)   Labs:  CBC: Recent Labs    10/22/17 1117 10/24/17 0350  WBC 6.1 8.1   HGB 12.1 9.7*  HCT 37.1 29.9*  PLT 296 239    COAGS: Recent Labs    10/22/17 1117  INR 0.88    BMP: Recent Labs    10/22/17 1117 10/24/17 0350 11/19/17 1024  NA 144 143  --   K 4.6 3.5  --   CL 109 112*  --   CO2 24 23  --   GLUCOSE 94 119*  --   BUN '11 12 12  ' CALCIUM 9.9 8.3*  --   CREATININE 0.79 0.74 0.76  GFRNONAA >60 >60 85  GFRAA >60 >60 98     Assessment and Plan:  I met with Mrs. Windhorst and her daughter.  We reviewed the follow-up CT study performed on 02/10/2018.  This demonstrates an expected cryoablation defect after treatment of the left posterior renal papillary carcinoma.  There is no evidence of residual enhancement or evidence of complication.  I recommended a follow-up CT study next May, one year post ablation.  Electronically SignedAletta Edouard T 03/05/2018, 5:18 PM     I spent a total of 15 Minutes in face to face in clinical consultation, greater than 50% of which was counseling/coordinating care post cryoablation of a left renal papillary carcinoma.

## 2018-03-19 DIAGNOSIS — M069 Rheumatoid arthritis, unspecified: Secondary | ICD-10-CM | POA: Diagnosis not present

## 2018-03-26 DIAGNOSIS — Z933 Colostomy status: Secondary | ICD-10-CM | POA: Diagnosis not present

## 2018-03-27 ENCOUNTER — Other Ambulatory Visit: Payer: Self-pay | Admitting: Family Medicine

## 2018-04-07 DIAGNOSIS — C642 Malignant neoplasm of left kidney, except renal pelvis: Secondary | ICD-10-CM | POA: Diagnosis not present

## 2018-04-07 DIAGNOSIS — M1A09X0 Idiopathic chronic gout, multiple sites, without tophus (tophi): Secondary | ICD-10-CM | POA: Diagnosis not present

## 2018-04-07 DIAGNOSIS — M2550 Pain in unspecified joint: Secondary | ICD-10-CM | POA: Diagnosis not present

## 2018-04-07 DIAGNOSIS — M0579 Rheumatoid arthritis with rheumatoid factor of multiple sites without organ or systems involvement: Secondary | ICD-10-CM | POA: Diagnosis not present

## 2018-04-07 DIAGNOSIS — Z79899 Other long term (current) drug therapy: Secondary | ICD-10-CM | POA: Diagnosis not present

## 2018-04-21 ENCOUNTER — Encounter: Payer: Self-pay | Admitting: Family Medicine

## 2018-04-21 ENCOUNTER — Ambulatory Visit (INDEPENDENT_AMBULATORY_CARE_PROVIDER_SITE_OTHER): Payer: Medicare Other | Admitting: Family Medicine

## 2018-04-21 VITALS — BP 120/74 | HR 55 | Temp 98.1°F

## 2018-04-21 DIAGNOSIS — M069 Rheumatoid arthritis, unspecified: Secondary | ICD-10-CM | POA: Diagnosis not present

## 2018-04-21 DIAGNOSIS — Z23 Encounter for immunization: Secondary | ICD-10-CM

## 2018-04-21 DIAGNOSIS — F1190 Opioid use, unspecified, uncomplicated: Secondary | ICD-10-CM | POA: Diagnosis not present

## 2018-04-21 MED ORDER — HYDROCODONE-ACETAMINOPHEN 10-325 MG PO TABS: 1.0000 | ORAL_TABLET | Freq: Four times a day (QID) | ORAL | 0 refills | Status: DC | PRN

## 2018-04-21 MED ORDER — ALBUTEROL SULFATE HFA 108 (90 BASE) MCG/ACT IN AERS: 1.0000 | INHALATION_SPRAY | RESPIRATORY_TRACT | 11 refills | Status: DC | PRN

## 2018-04-23 ENCOUNTER — Encounter: Payer: Self-pay | Admitting: Family Medicine

## 2018-04-23 NOTE — Progress Notes (Signed)
   Subjective:    Patient ID: [name], female    DOB: 1955/07/17, 62 y.o.   MRN: [id number]  HPI Here for pain management. She is doing well.  Indication for chronic opioid: rheumatoid arthritis  Medication and dose: Norco 10-325 # pills per month: 120 Last UDS date: 07-08-17 Opioid Treatment Agreement signed (Y/N): 10-21-17 Opioid Treatment Agreement last reviewed with patient:  04-21-18 NCCSRS reviewed this encounter (include red flags):  04-21-18    Review of Systems  Constitutional: Negative.   Respiratory: Negative.   Cardiovascular: Negative.   Musculoskeletal: Positive for arthralgias.       Objective:   Physical Exam  Constitutional: She is oriented to person, place, and time. She appears well-developed and well-nourished.  [name]   Cardiovascular: Normal rate, regular rhythm, normal heart sounds and intact distal pulses.  Pulmonary/Chest: Effort normal and breath sounds normal.  Neurological: She is alert and oriented to person, place, and time.          Assessment & Plan:  Pain management, meds were refilled.  [name], MD

## 2018-04-24 DIAGNOSIS — Z933 Colostomy status: Secondary | ICD-10-CM | POA: Diagnosis not present

## 2018-04-29 ENCOUNTER — Other Ambulatory Visit: Payer: Self-pay | Admitting: Family Medicine

## 2018-05-02 ENCOUNTER — Other Ambulatory Visit: Payer: Self-pay | Admitting: Family Medicine

## 2018-05-05 ENCOUNTER — Other Ambulatory Visit: Payer: Self-pay | Admitting: Family Medicine

## 2018-05-05 MED ORDER — ALLOPURINOL 100 MG PO TABS: 100.0000 mg | ORAL_TABLET | Freq: Every day | ORAL | 0 refills | Status: DC

## 2018-05-05 MED ORDER — AMLODIPINE BESYLATE 10 MG PO TABS: 10.0000 mg | ORAL_TABLET | Freq: Every day | ORAL | 0 refills | Status: DC

## 2018-05-05 NOTE — Telephone Encounter (Signed)
Copied from [place] [id number]. Topic: Quick Communication - Rx Refill/Question >> May 05, 2018 12:36 PM Blase Mess A wrote: Medication: amLODipine (NORVASC) 10 MG tablet [[id number]] ,allopurinol (ZYLOPRIM) 100 MG tablet [[id number]]  Has the patient contacted their pharmacy? Yes  (Agent: If no, request that the patient contact the pharmacy for the refill.) (Agent: If yes, when and what did the pharmacy advise?)  Preferred Pharmacy (with phone number or street name): [facility] [place], [address] [place] [address] Phone: [phone] Fax: [phone]    Agent: Please be advised that RX refills may take up to 3 business days. We ask that you follow-up with your pharmacy.

## 2018-05-13 DIAGNOSIS — L299 Pruritus, unspecified: Secondary | ICD-10-CM | POA: Diagnosis not present

## 2018-05-18 ENCOUNTER — Other Ambulatory Visit: Payer: Self-pay | Admitting: Family Medicine

## 2018-05-19 NOTE — Telephone Encounter (Signed)
Pt calling to check status on her refill on zanaflex. Please advise.

## 2018-05-19 NOTE — Telephone Encounter (Signed)
[place] called and spoke to [name], [employer]chant [employer] officer about the refill request. I advised a refill was sent on 12/16/17 #90/3 refills. She says it was received and the patient has used them up. I advised this will be sent to the provider for refill.

## 2018-05-19 NOTE — Telephone Encounter (Signed)
Dr. Sarajane Jews please advise if ok to refill. Thanks

## 2018-05-19 NOTE — Telephone Encounter (Signed)
Routing to PCP CMA for refills! 

## 2018-05-19 NOTE — Telephone Encounter (Signed)
Requested medication (s) are due for refill today: Yes  Requested medication (s) are on the active medication list: Yes  Last refill:  12/16/17  Future visit scheduled: No  Notes to clinic:  Unable to refill per protocol     Requested Prescriptions  Pending Prescriptions Disp Refills   tiZANidine (ZANAFLEX) 4 MG tablet [Pharmacy Med Name: TIZANIDINE 4MG  TABLETS] 90 tablet 0    Sig: TAKE 1 TABLET BY MOUTH TWICE DAILY     Not Delegated - Cardiovascular:  Alpha-2 Agonists - tizanidine Failed - 05/19/2018 12:40 PM      Failed - This refill cannot be delegated      Passed - Valid encounter within last 6 months    Recent Outpatient Visits          4 weeks ago Chronic narcotic use   [facility] [facility] [place], [place]   3 months ago Chronic narcotic use   [facility], Ishmael Holter, MD   7 months ago Chronic narcotic use   [facility], Ishmael Holter, MD   9 months ago Renal mass   [facility], Ishmael Holter, MD   10 months ago Narcotic drug use   [facility], Ishmael Holter, MD

## 2018-05-21 NOTE — Telephone Encounter (Signed)
Call in #90 with 5 rf 

## 2018-05-26 DIAGNOSIS — Z933 Colostomy status: Secondary | ICD-10-CM | POA: Diagnosis not present

## 2018-05-29 ENCOUNTER — Other Ambulatory Visit: Payer: Self-pay | Admitting: Family Medicine

## 2018-06-13 ENCOUNTER — Other Ambulatory Visit: Payer: Self-pay | Admitting: Family Medicine

## 2018-06-16 NOTE — Telephone Encounter (Signed)
Dr. Sarajane Jews please advise on refill of medication. thanks

## 2018-06-26 DIAGNOSIS — Z933 Colostomy status: Secondary | ICD-10-CM | POA: Diagnosis not present

## 2018-07-21 ENCOUNTER — Encounter: Payer: Self-pay | Admitting: Family Medicine

## 2018-07-21 ENCOUNTER — Ambulatory Visit (INDEPENDENT_AMBULATORY_CARE_PROVIDER_SITE_OTHER): Payer: Medicare Other | Admitting: Family Medicine

## 2018-07-21 VITALS — BP 134/80 | HR 66 | Temp 97.9°F

## 2018-07-21 DIAGNOSIS — M069 Rheumatoid arthritis, unspecified: Secondary | ICD-10-CM | POA: Diagnosis not present

## 2018-07-21 DIAGNOSIS — G8929 Other chronic pain: Secondary | ICD-10-CM | POA: Diagnosis not present

## 2018-07-21 DIAGNOSIS — F1190 Opioid use, unspecified, uncomplicated: Secondary | ICD-10-CM | POA: Diagnosis not present

## 2018-07-21 MED ORDER — HYDROCODONE-ACETAMINOPHEN 10-325 MG PO TABS: 1.0000 | ORAL_TABLET | Freq: Four times a day (QID) | ORAL | 0 refills | Status: DC | PRN

## 2018-07-21 NOTE — Progress Notes (Signed)
   Subjective:    Patient ID: [name], female    DOB: [birth year], 63 y.o.   MRN: [id number]  HPI Here for pain management. She is doing well.  Indication for chronic opioid: rheumatoid arthritis  Medication and dose: Norco 10-325  # pills per month: 120 Last UDS date: 07-21-18 Opioid Treatment Agreement signed (Y/N): 10-21-17 Opioid Treatment Agreement last reviewed with patient:  07-21-18 NCCSRS reviewed this encounter (include red flags):  07-21-18    Review of Systems  Constitutional: Negative.   Respiratory: Negative.   Cardiovascular: Negative.   Musculoskeletal: Positive for arthralgias.  Neurological: Negative.        Objective:   Physical Exam Constitutional:      Appearance: Normal appearance.  Cardiovascular:     Rate and Rhythm: Normal rate and regular rhythm.     Pulses: Normal pulses.     Heart sounds: Normal heart sounds.  Pulmonary:     Effort: Pulmonary effort is normal.     Breath sounds: Normal breath sounds.  Neurological:     General: No focal deficit present.     Mental Status: She is alert and oriented to person, place, and time.           Assessment & Plan:  Pain management, meds were refilled.  [name], MD

## 2018-07-22 ENCOUNTER — Ambulatory Visit: Payer: Medicare Other | Admitting: Family Medicine

## 2018-07-23 LAB — PAIN MGMT, PROFILE 8 W/CONF, U
6 Acetylmorphine: NEGATIVE ng/mL (ref <10)
Alcohol Metabolites: NEGATIVE ng/mL (ref <500)
Amphetamines: NEGATIVE ng/mL (ref <500)
Benzodiazepines: NEGATIVE ng/mL (ref <100)
Buprenorphine, Urine: NEGATIVE ng/mL (ref <5)
Cocaine Metabolite: NEGATIVE ng/mL (ref <150)
Codeine: NEGATIVE ng/mL (ref <50)
Creatinine: 95.4 mg/dL
Hydrocodone: 2400 ng/mL — ABNORMAL HIGH (ref <50)
Hydromorphone: 109 ng/mL — ABNORMAL HIGH (ref <50)
MDMA: NEGATIVE ng/mL (ref <500)
Marijuana Metabolite: NEGATIVE ng/mL (ref <20)
Morphine: NEGATIVE ng/mL (ref <50)
Norhydrocodone: 3100 ng/mL — ABNORMAL HIGH (ref <50)
Opiates: POSITIVE ng/mL — AB (ref <100)
Oxidant: NEGATIVE ug/mL (ref <200)
Oxycodone: NEGATIVE ng/mL (ref <100)
pH: 5.93 (ref 4.5–9.0)

## 2018-07-24 DIAGNOSIS — M0549 Rheumatoid myopathy with rheumatoid arthritis of multiple sites: Secondary | ICD-10-CM | POA: Diagnosis not present

## 2018-07-24 DIAGNOSIS — M069 Rheumatoid arthritis, unspecified: Secondary | ICD-10-CM | POA: Diagnosis not present

## 2018-07-24 DIAGNOSIS — Z933 Colostomy status: Secondary | ICD-10-CM | POA: Diagnosis not present

## 2018-07-24 DIAGNOSIS — R5381 Other malaise: Secondary | ICD-10-CM | POA: Diagnosis not present

## 2018-07-24 DIAGNOSIS — K5720 Diverticulitis of large intestine with perforation and abscess without bleeding: Secondary | ICD-10-CM | POA: Diagnosis not present

## 2018-07-28 DIAGNOSIS — Z79899 Other long term (current) drug therapy: Secondary | ICD-10-CM | POA: Diagnosis not present

## 2018-07-28 DIAGNOSIS — C642 Malignant neoplasm of left kidney, except renal pelvis: Secondary | ICD-10-CM | POA: Diagnosis not present

## 2018-07-28 DIAGNOSIS — M1A09X0 Idiopathic chronic gout, multiple sites, without tophus (tophi): Secondary | ICD-10-CM | POA: Diagnosis not present

## 2018-07-28 DIAGNOSIS — M2550 Pain in unspecified joint: Secondary | ICD-10-CM | POA: Diagnosis not present

## 2018-07-28 DIAGNOSIS — M0579 Rheumatoid arthritis with rheumatoid factor of multiple sites without organ or systems involvement: Secondary | ICD-10-CM | POA: Diagnosis not present

## 2018-07-31 ENCOUNTER — Other Ambulatory Visit: Payer: Self-pay | Admitting: Family Medicine

## 2018-07-31 NOTE — Telephone Encounter (Signed)
Dr. Sarajane Jews please advise on refill of tramadol.  Thanks

## 2018-08-01 NOTE — Telephone Encounter (Signed)
Call in #180 with 3 rf 

## 2018-08-03 ENCOUNTER — Other Ambulatory Visit: Payer: Self-pay | Admitting: Family Medicine

## 2018-08-04 NOTE — Telephone Encounter (Signed)
Pt called to f/u on request as pharmacy has not received. Dr Barbie Banner notes approved 2/7. Please advise on refill delay.   [phone] - [place], [address] AT [address] [place] [phone] (Phone) [phone] (Fax)

## 2018-08-04 NOTE — Telephone Encounter (Signed)
Refill has been called to the pharmacy and left on the VM for refills.

## 2018-08-18 DIAGNOSIS — M0549 Rheumatoid myopathy with rheumatoid arthritis of multiple sites: Secondary | ICD-10-CM | POA: Diagnosis not present

## 2018-08-18 DIAGNOSIS — K5720 Diverticulitis of large intestine with perforation and abscess without bleeding: Secondary | ICD-10-CM | POA: Diagnosis not present

## 2018-08-18 DIAGNOSIS — R5381 Other malaise: Secondary | ICD-10-CM | POA: Diagnosis not present

## 2018-08-18 DIAGNOSIS — M069 Rheumatoid arthritis, unspecified: Secondary | ICD-10-CM | POA: Diagnosis not present

## 2018-09-01 DIAGNOSIS — M0549 Rheumatoid myopathy with rheumatoid arthritis of multiple sites: Secondary | ICD-10-CM | POA: Diagnosis not present

## 2018-09-16 ENCOUNTER — Telehealth: Payer: Self-pay | Admitting: *Deleted

## 2018-09-16 NOTE — Telephone Encounter (Signed)
Copied from [place] [phone] [id number]. Topic: General - Other >> Sep 16, 2018  2:33 PM [name], Christina wrote: Reason for CRM: Pt stated she is blind and has a colostomy bag and her daugter comes over to help her but she needs Dr. Sarajane Jews to contact her daughter regarding instructions on what needs to be done in this national crisis. Pt stated Dr. Sarajane Jews has her daughter's e-mail address and she would like her daughter to be notified  of what she needs to do. Cb# [phone]   I called the pt and made her aware of Dr. Barbie Banner recs.  She stated that with the lockdown her daughter is needing a note emailed to her stating that Dr. Sarajane Jews is aware that she needs to be able to go out to her mothers house to be able to change her colostomy bags.  Dr. Sarajane Jews please advise. Thanks

## 2018-09-18 ENCOUNTER — Telehealth: Payer: Self-pay | Admitting: Family Medicine

## 2018-09-18 NOTE — Telephone Encounter (Signed)
Pt is requesting a letter  stating that she needs for her daughter to come out to her home to change out her ostomy bags. Patient is fearful because of the mandate for covid 19. You can email this to her case worker Joy Heard @ [email or web address] if have question you can reach out to her case worker [phone].

## 2018-09-19 NOTE — Telephone Encounter (Signed)
Let her know that if she is concerned about possible coronavirus infection, this is a respiratory disease and it cannot be spread through body fluids like stool. She can still change her colostomy bags like she always has

## 2018-09-19 NOTE — Telephone Encounter (Signed)
The letter is ready to be faxed

## 2018-09-22 NOTE — Telephone Encounter (Signed)
I called [name] at the number below and asked for a fax number as this cannot be sent via email.  Letter was faxed to [phone].

## 2018-09-22 NOTE — Telephone Encounter (Signed)
Joann faxed this letter to Bethann Punches the case manager last Friday. It cannot be emailed.

## 2018-10-01 ENCOUNTER — Telehealth: Payer: Self-pay | Admitting: Internal Medicine

## 2018-10-01 DIAGNOSIS — Z933 Colostomy status: Secondary | ICD-10-CM | POA: Diagnosis not present

## 2018-10-01 NOTE — Telephone Encounter (Signed)
Thank you.  I agree with this plan

## 2018-10-01 NOTE — Telephone Encounter (Signed)
A Dr. Carlean Purl Patient with a history of bowel obstruction and resection with colostomy.  Patient reports that she had abdominal pain that started last evening.  She was awakened at 1:00 am with horrible pain and vomiting that began around 3:00 am.  She Has had vomiting almost hourly since 3:00 am.  She took a phenergan at 3:00, but was not able to keep it down. She was able to take a vicodin at around 8 and the pain was slightly better for a few hours and the vomiting subsided until approximately 10:30. .  She has had continued vomiting since 10:30.  Patient is advised that she needs to go to the ED for evaluation.  The hospital APP Tye Savoy, RNP updated. Marland Kitchen  FYI

## 2018-10-07 ENCOUNTER — Telehealth: Payer: Self-pay | Admitting: *Deleted

## 2018-10-07 NOTE — Telephone Encounter (Signed)
Copied from [place] [id number]. Topic: Appointment Scheduling - Scheduling Inquiry for Clinic >> Oct 06, 2018  4:13 PM Sheran Luz wrote: Reason for CRM: Patient would like a medication management appointment with Dr. Sarajane Jews. Patient is visually impaired so she inquired about a telephone visit.   Called and spoke with pt and she is aware of appt with Dr. Sarajane Jews tomorrow for [facility]

## 2018-10-08 ENCOUNTER — Encounter: Payer: Self-pay | Admitting: Family Medicine

## 2018-10-08 ENCOUNTER — Other Ambulatory Visit: Payer: Self-pay

## 2018-10-08 ENCOUNTER — Ambulatory Visit (INDEPENDENT_AMBULATORY_CARE_PROVIDER_SITE_OTHER): Payer: Medicare Other | Admitting: Family Medicine

## 2018-10-08 DIAGNOSIS — M069 Rheumatoid arthritis, unspecified: Secondary | ICD-10-CM | POA: Diagnosis not present

## 2018-10-08 DIAGNOSIS — F1190 Opioid use, unspecified, uncomplicated: Secondary | ICD-10-CM

## 2018-10-08 MED ORDER — HYDROCODONE-ACETAMINOPHEN 10-325 MG PO TABS: 1.0000 | ORAL_TABLET | Freq: Four times a day (QID) | ORAL | 0 refills | Status: DC | PRN

## 2018-10-08 MED ORDER — TRAMADOL HCL 50 MG PO TABS: ORAL_TABLET | ORAL | 3 refills | Status: DC

## 2018-10-08 MED ORDER — MEGESTROL ACETATE 625 MG/5ML PO SUSP: ORAL | 3 refills | Status: DC

## 2018-10-08 MED ORDER — METOCLOPRAMIDE HCL 10 MG PO TABS: 10.0000 mg | ORAL_TABLET | Freq: Four times a day (QID) | ORAL | 5 refills | Status: DC

## 2018-10-08 NOTE — Progress Notes (Signed)
   Subjective:    Patient ID: [name], female    DOB: [birth year], 63 y.o.   MRN: [id number]  HPI Virtual Visit via Telephone Note  I connected with the patient on 10/08/18 at  1:30 PM EDT by telephone and verified that I am speaking with the correct person using two identifiers.   I discussed the limitations, risks, security and privacy concerns of performing an evaluation and management service by telephone and the availability of in person appointments. I also discussed with the patient that there may be a patient responsible charge related to this service. The patient expressed understanding and agreed to proceed.  Location patient: home Location provider: work or home office Participants present for the call: patient, provider Patient did not have a visit in the prior 7 days to address this/these issue(s).   History of Present Illness: This is for pain management. She is doing well as far as arthritis pains.  Indication for chronic opioid: rheumatoid arthritis  Medication and dose: Norco 10-325 # pills per month: 120 Last UDS date: 07-21-18 Opioid Treatment Agreement signed (Y/N): 07-12-17 Opioid Treatment Agreement last reviewed with patient:  10-08-18 [place] reviewed this encounter (include red flags):  10-08-18    Observations/Objective: Patient sounds cheerful and well on the phone. I do not appreciate any SOB. Speech and thought processing are grossly intact. Patient reported vitals:  Assessment and Plan: Pain management, meds were refilled.  [name], MD   Follow Up Instructions:     [id number] 5-10 [id number] 11-20 [id number] 21-30 I did not refer this patient for an OV in the next 24 hours for this/these issue(s).  I discussed the assessment and treatment plan with the patient. The patient was provided an opportunity to ask questions and all were answered. The patient agreed with the plan and demonstrated an understanding of the instructions.   The patient was  advised to call back or seek an in-person evaluation if the symptoms worsen or if the condition fails to improve as anticipated.  I provided 14 minutes of non-face-to-face time during this encounter.   [name], MD    Review of Systems     Objective:   Physical Exam        Assessment & Plan:

## 2018-10-09 DIAGNOSIS — Z933 Colostomy status: Secondary | ICD-10-CM | POA: Diagnosis not present

## 2018-10-25 ENCOUNTER — Other Ambulatory Visit: Payer: Self-pay | Admitting: Family Medicine

## 2018-10-27 DIAGNOSIS — M0549 Rheumatoid myopathy with rheumatoid arthritis of multiple sites: Secondary | ICD-10-CM | POA: Diagnosis not present

## 2018-10-27 DIAGNOSIS — M0579 Rheumatoid arthritis with rheumatoid factor of multiple sites without organ or systems involvement: Secondary | ICD-10-CM | POA: Diagnosis not present

## 2018-10-28 DIAGNOSIS — Z933 Colostomy status: Secondary | ICD-10-CM | POA: Diagnosis not present

## 2018-11-25 DIAGNOSIS — M0549 Rheumatoid myopathy with rheumatoid arthritis of multiple sites: Secondary | ICD-10-CM | POA: Diagnosis not present

## 2018-11-25 DIAGNOSIS — K5720 Diverticulitis of large intestine with perforation and abscess without bleeding: Secondary | ICD-10-CM | POA: Diagnosis not present

## 2018-11-25 DIAGNOSIS — R5381 Other malaise: Secondary | ICD-10-CM | POA: Diagnosis not present

## 2018-11-25 DIAGNOSIS — M069 Rheumatoid arthritis, unspecified: Secondary | ICD-10-CM | POA: Diagnosis not present

## 2018-11-28 DIAGNOSIS — Z933 Colostomy status: Secondary | ICD-10-CM | POA: Diagnosis not present

## 2018-12-08 ENCOUNTER — Telehealth: Payer: Self-pay | Admitting: Family Medicine

## 2018-12-08 MED ORDER — HYDROCODONE-ACETAMINOPHEN 10-325 MG PO TABS: 1.0000 | ORAL_TABLET | Freq: Four times a day (QID) | ORAL | 0 refills | Status: AC | PRN

## 2018-12-08 NOTE — Telephone Encounter (Signed)
Called and spoke with pt and she is aware of rx that has been sent to the pharmacy.  

## 2018-12-08 NOTE — Telephone Encounter (Signed)
Dr. Fry please advise. Thanks  

## 2018-12-08 NOTE — Telephone Encounter (Signed)
Copied from [place] [id number]. Topic: Quick Communication - Rx Refill/Question >> Dec 08, 2018  8:04 AM Celene Kras A wrote: Medication: Vicodin   Has the patient contacted their pharmacy? Yes. Pt called stating the pharmacy told her two refill requests were sent in for this medication for the month of July by PCP instead of one for June and one for July. Pt states she is currently out of medication and states she does not think she can last 3 days without it. Medication I could not find on med list. Please advise. (Agent: If no, request that the patient contact the pharmacy for the refill.) (Agent: If yes, when and what did the pharmacy advise?)  Preferred Pharmacy (with phone number or street name): [facility] [phone] - [address], [address] [place] [address] Phone: [phone] Fax: [phone] Not a 24 hour pharmacy; exact hours not known.   Agent: Please be advised that RX refills may take up to 3 business days. We ask that you follow-up with your pharmacy.

## 2018-12-08 NOTE — Telephone Encounter (Signed)
I sent in for one month, starting today

## 2018-12-29 DIAGNOSIS — M0549 Rheumatoid myopathy with rheumatoid arthritis of multiple sites: Secondary | ICD-10-CM | POA: Diagnosis not present

## 2018-12-31 DIAGNOSIS — Z933 Colostomy status: Secondary | ICD-10-CM | POA: Diagnosis not present

## 2019-01-01 ENCOUNTER — Other Ambulatory Visit: Payer: Self-pay | Admitting: Interventional Radiology

## 2019-01-01 DIAGNOSIS — C649 Malignant neoplasm of unspecified kidney, except renal pelvis: Secondary | ICD-10-CM

## 2019-01-02 ENCOUNTER — Other Ambulatory Visit: Payer: Self-pay | Admitting: Interventional Radiology

## 2019-01-02 ENCOUNTER — Other Ambulatory Visit: Payer: Self-pay

## 2019-01-02 DIAGNOSIS — C649 Malignant neoplasm of unspecified kidney, except renal pelvis: Secondary | ICD-10-CM

## 2019-01-05 ENCOUNTER — Ambulatory Visit (HOSPITAL_COMMUNITY)
Admission: RE | Admit: 2019-01-05 | Discharge: 2019-01-05 | Disposition: A | Discharge status: Home / Self Care | Payer: Medicare Other | Source: Ambulatory Visit | Attending: Interventional Radiology | Admitting: Interventional Radiology

## 2019-01-05 ENCOUNTER — Encounter (HOSPITAL_COMMUNITY): Payer: Self-pay

## 2019-01-05 ENCOUNTER — Other Ambulatory Visit: Payer: Self-pay

## 2019-01-05 DIAGNOSIS — R932 Abnormal findings on diagnostic imaging of liver and biliary tract: Secondary | ICD-10-CM | POA: Insufficient documentation

## 2019-01-05 DIAGNOSIS — C642 Malignant neoplasm of left kidney, except renal pelvis: Secondary | ICD-10-CM | POA: Diagnosis not present

## 2019-01-05 DIAGNOSIS — C649 Malignant neoplasm of unspecified kidney, except renal pelvis: Secondary | ICD-10-CM | POA: Insufficient documentation

## 2019-01-05 DIAGNOSIS — I700 Atherosclerosis of aorta: Secondary | ICD-10-CM | POA: Insufficient documentation

## 2019-01-05 LAB — POCT I-STAT CREATININE: Creatinine, Ser: 0.8 mg/dL (ref 0.44–1.00)

## 2019-01-05 MED ORDER — SODIUM CHLORIDE (PF) 0.9 % IJ SOLN
INTRAMUSCULAR | Status: AC
  Filled 2019-01-05: qty 50

## 2019-01-05 MED ORDER — IOHEXOL 300 MG/ML  SOLN
100.0000 mL | Freq: Once | INTRAMUSCULAR | Status: AC | PRN
  Administered 2019-01-05: 100 mL via INTRAVENOUS

## 2019-01-08 ENCOUNTER — Encounter: Payer: Self-pay | Admitting: *Deleted

## 2019-01-08 ENCOUNTER — Ambulatory Visit
Admission: RE | Admit: 2019-01-08 | Discharge: 2019-01-08 | Disposition: A | Discharge status: Home / Self Care | Payer: Medicare Other | Source: Ambulatory Visit | Attending: Interventional Radiology | Admitting: Interventional Radiology

## 2019-01-08 ENCOUNTER — Other Ambulatory Visit: Payer: Self-pay

## 2019-01-08 DIAGNOSIS — Z9889 Other specified postprocedural states: Secondary | ICD-10-CM | POA: Diagnosis not present

## 2019-01-08 DIAGNOSIS — C649 Malignant neoplasm of unspecified kidney, except renal pelvis: Secondary | ICD-10-CM

## 2019-01-08 DIAGNOSIS — C642 Malignant neoplasm of left kidney, except renal pelvis: Secondary | ICD-10-CM | POA: Diagnosis not present

## 2019-01-08 HISTORY — PX: IR RADIOLOGIST EVAL & MGMT: IMG5224

## 2019-01-08 NOTE — Progress Notes (Signed)
Chief Complaint: Patient was consulted remotely today (TeleHealth) for follow up after cryoablation of a left renal carcinoma.  History of Present Illness: [name] is a 63 y.o. 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  She did quite well following the procedure and remains asymptomatic.  Renal function just prior to recent follow-up CT remains normal.  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   bilateral     Past Surgical History:  Procedure Laterality Date   ABLATION  01/04/14   atrial flutter ablation (2 circuits) by Dr Lovena Le   ATRIAL FLUTTER ABLATION N/A 01/04/2014   Procedure: ATRIAL FLUTTER ABLATION;  Surgeon: Evans Lance, MD;  Location: [facility] CATH LAB;  Service: Cardiovascular;  Laterality: N/A;   BUNIONECTOMY Bilateral 1/10   COLON SURGERY     colectomy   COLOSTOMY  11/27/2012   HERNIA REPAIR     IR RADIOLOGIST EVAL & MGMT  10/01/2017   IR RADIOLOGIST EVAL & MGMT  11/21/2017   IR RADIOLOGIST EVAL & MGMT  02/13/2018   LAPAROSCOPIC ABDOMINAL EXPLORATION N/A 01/06/2013   Procedure: LAPAROSCOPIC ABDOMINAL EXPLORATION;  Surgeon: Adin Hector, MD;  Location: [facility] ORS;  Service: General;   Laterality: N/A;   LAPAROSCOPIC LYSIS OF ADHESIONS N/A 01/06/2013   Procedure: LAPAROSCOPIC LYSIS OF ADHESIONS/ INTEROTOMY REPAIR;  Surgeon: Adin Hector, MD;  Location: [facility] OR[facility];  Service: General;  Laterality: N/A;   LAPAROTOMY N/A 11/27/2012   Procedure: EXPLORATORY LAPAROTOMY SIGMOID COLECTOMY, COLOSTOMY;  Surgeon: Madilyn Hook, DO;  Location: [facility];  Service: General;  Laterality: N/A;   RADIOLOGY WITH ANESTHESIA Left 10/23/2017   Procedure: CT RENAL CRYO AND BIOPSY;  Surgeon: Aletta Edouard, MD;  Location: [facility];  Service: Radiology;  Laterality: Left;   TOTAL KNEE ARTHROPLASTY Bilateral 6/08    Allergies: Spinach  Medications: Prior to Admission medications   Medication Sig Start Date End Date Taking? Authorizing Provider  Abatacept (ORENCIA) 125 MG/ML SOSY Inject 125 mg into the skin every Monday.     [provider]  albuterol (PROVENTIL HFA;VENTOLIN HFA) 108 (90 Base) MCG/ACT inhaler Inhale 1 puff into the lungs every 4 (four) hours as needed for wheezing or shortness of breath. 04/21/18   [name], MD  allopurinol (ZYLOPRIM) 100 MG tablet TAKE 1 TABLET(100 MG) BY MOUTH DAILY 10/29/18   [name], MD  amLODipine (NORVASC) 10 MG tablet TAKE 1 TABLET(10 MG) BY MOUTH DAILY 10/29/18   [name], MD  aspirin EC 81 MG tablet Take 1 tablet (81 mg total)  by mouth daily. 05/11/14   Verlee Monte, MD  CALCIUM PO Take 1 tablet by mouth 2 (two) times daily.    [provider]  cetirizine (ZYRTEC) 10 MG tablet Take 1 tablet (10 mg total) by mouth daily. 11/17/14   [name], MD  chlorhexidine (PERIDEX) 0.12 % solution Use as directed 10 mLs in the mouth or throat daily.  07/25/17   [provider]  Cholecalciferol (VITAMIN D) 2000 units CAPS Take 2,000 Units by mouth daily.    [provider]  CIPRODEX OTIC suspension INSTILL 4 DROPS UNTO EACH EAR TWICE DAILY AS NEEDED 06/19/18   [name], MD  diclofenac sodium (VOLTAREN) 1 % GEL  Apply 1 application topically 4 (four) times daily as needed (arthritis).    [provider]  docusate sodium (COLACE) 100 MG capsule Take 100 mg by mouth 2 (two) times daily.    [provider]  esomeprazole (NEXIUM) 40 MG capsule Take 1 capsule (40 mg total) by mouth daily. 01/28/18   [name], MD  folic acid (FOLVITE) 326 MCG tablet Take 400 mcg by mouth daily.    [provider]  ketoconazole (NIZORAL) 2 % cream Apply 1 application topically daily as needed for irritation.    [provider]  megestrol (MEGACE ES) 625 MG/5ML suspension take 5 milliliters by mouth three times daily before meals as needed for appetite 10/08/18   [name], MD  Methotrexate Sodium (METHOTREXATE, PF,) 50 MG/2ML injection Inject 0.5 mLs into the muscle every Monday. 05/14/16   [provider]  metoCLOPramide (REGLAN) 10 MG tablet Take 1 tablet (10 mg total) by mouth every 6 (six) hours. 10/08/18   [name], MD  Multiple Vitamin (MULTIVITAMIN WITH MINERALS) TABS tablet Take 1 tablet by mouth daily.    [provider]  polyethylene glycol (MIRALAX / GLYCOLAX) packet take 17 gram by mouth once daily Patient taking differently: take 17 gram by mouth once daily as needed for constipation 04/15/17   [name], MD  tiZANidine (ZANAFLEX) 4 MG tablet TAKE 1 TABLET BY MOUTH TWICE DAILY 05/21/18   [name], MD  traMADol (ULTRAM) 50 MG tablet TAKE 1-2 TABLETS BY MOUTH EVERY 6 HOURS AS NEEDED 10/08/18   [name], MD  triamcinolone (KENALOG) 0.025 % cream Apply 1 application topically daily as needed (itching).    [provider]  vitamin B-12 (CYANOCOBALAMIN) 500 MCG tablet Take 500 mcg by mouth daily.    [provider]  vitamin C (ASCORBIC ACID) 500 MG tablet Take 500 mg by mouth daily.     [provider]     Family History  Problem Relation Age of Onset   Heart attack Neg Hx    Colon cancer Neg Hx     Social  History   Socioeconomic History   Marital status: Single    Spouse name: Not on file   Number of children: 1   Years of education: Not on file   Highest education level: Not on file  Occupational History   Not on file  Social Needs   Financial resource strain: Not on file   Food insecurity    Worry: Not on file    Inability: Not on file   Transportation needs    Medical: Not on file    Non-medical: Not on file  Tobacco Use   Smoking status: Former Smoker    Packs/day: 1.00    Types: Cigarettes  Quit date: 11/27/2012    Years since quitting: 6.1   Smokeless tobacco: Never Used  Substance and Sexual Activity   Alcohol use: No    Alcohol/week: 0.0 standard drinks    Comment: recovering alcoholic sober since 1610   Drug use: No   Sexual activity: Not Currently    Partners: Male    Birth control/protection: None  Lifestyle   Physical activity    Days per week: Not on file    Minutes per session: Not on file   Stress: Not on file  Relationships   Social connections    Talks on phone: Not on file    Gets together: Not on file    Attends religious service: Not on file    Active member of club or organization: Not on file    Attends meetings of clubs or organizations: Not on file    Relationship status: Not on file  Other Topics Concern   Not on file  Social History Narrative   Not on file    ECOG Status: 0 - Asymptomatic  Review of Systems  Constitutional: Negative.   Respiratory: Negative.   Cardiovascular: Negative.   Gastrointestinal: Negative.   Genitourinary: Negative.   Neurological: Negative.     Review of Systems: A 12 point ROS discussed and pertinent positives are indicated in the HPI above.  All other systems are negative.  Physical Exam No direct physical exam was performed (except for noted visual exam findings with Video Visits).   Vital Signs: LMP 06/25/2006 (LMP Unknown)   Imaging: Ct Abdomen W Wo Contrast  Result  Date: 01/05/2019 CLINICAL DATA:  Status post cryo ablation of papillary renal cell carcinoma from the left kidney. EXAM: CT ABDOMEN WITHOUT AND WITH CONTRAST TECHNIQUE: Multidetector CT imaging of the abdomen was performed following the standard protocol before and following the bolus administration of intravenous contrast. CONTRAST:  131mL OMNIPAQUE IOHEXOL 300 MG/ML  SOLN COMPARISON:  02/10/2018 FINDINGS: Lower chest: Subpleural nodule in the right middle lobe measures 5 mm, image 17/5. Unchanged. Hepatobiliary: Segment 4 liver cyst measures 3.7 cm previously 3.6 cm. Segment 3 liver cyst measuring 1.1 cm is unchanged. Gallbladder is unremarkable. The common bile duct measures 1 cm in maximum diameter. Previously this measured 5 mm. Pancreas: Mildly progressive, increase caliber of the main duct. On today's exam 5 mm. On the previous exam 3 mm. No inflammation or mass identified. Spleen: Normal in size without focal abnormality. Adrenals/Urinary Tract: Normal adrenal glands. Normal right kidney. The cryo ablation zone within the posterior cortex of the interpolar left kidney measures 2.6 by 1.7 cm, image 34/11. On the previous exam this measured 3.0 by 2.7 cm. No abnormal enhancement identified within the cryoablation zone. No complications from cryoablation noted. No perinephric fluid collections, hematoma or hydronephrosis identified. Left renal vein appears patent. Stomach/Bowel: Stomach is unremarkable. There is no abnormal bowel wall thickening, distension or inflammation. Left abdominal colostomy noted. Vascular/Lymphatic: Aortic atherosclerosis. No aneurysm. No adenopathy identified within the upper abdomen. Other: No free fluid or fluid collections. Imaged portion of infraumbilical ventral hernia is again noted containing nonobstructed loops of small and large bowel. Musculoskeletal: Marked scoliosis and degenerative disc disease identified. No acute or significant osseous abnormalities. IMPRESSION: 1.  Interval decrease in overall size of cryo ablation zone arising from the posterior cortex of the interpolar left kidney. No enhancement identified within the cryo ablation zone to suggest recurrent tumor. 2. Progressive increase caliber of the common bile duct and pancreatic duct. No obvious  mass or obstructing stone noted. However, consider further characterization with contrast enhanced MRI/MRCP. 3.  Aortic Atherosclerosis (ICD10-I70.0). Electronically Signed   By: Kerby Moors M.D.   On: 01/05/2019 08:42    Labs:  CBC: No results for input(s): WBC, HGB, HCT, PLT in the last 8760 hours.  COAGS: No results for input(s): INR, APTT in the last 8760 hours.  BMP: Recent Labs    01/05/19 0735  CREATININE 0.80    Assessment and Plan:  I spoke with Ms. Bertell Maria by phone.  A follow-up CT of the abdomen was performed with contrast on 01/05/2019 that demonstrates further retraction of post ablation scar tissue at the level of the medial posterior left kidney.  There is no evidence of tumor recurrence or abnormal enhancement.    Note was made on the study of some progressive dilatation of the pancreatic duct measuring up to 5 mm and the common bile duct measuring up to 10 mm.  There is no evidence of obstructing mass by CT.  Consideration of MRI/MRCP for further evaluation was recommended.  Ms. Douds has seen Dr. Carlean Purl previously and I will forward this note to Dr. Carlean Purl.  There are no recent liver function tests.  I recommended another follow-up CT to follow the renal ablation site in 1 year.   Electronically Signed: Azzie Roup 01/08/2019, 3:03 PM   I spent a total of 15 Minutes in remote  clinical consultation, greater than 50% of which was counseling/coordinating care post cryoablation of a left renal carcinoma.  Visit type: Audio only (telephone). Audio (no video) only due to patient's lack of internet/smartphone capability. Alternative for in-person consultation at [facility], [address] [place], [place], [place]. This visit type was conducted due to national recommendations for restrictions regarding the COVID-19 Pandemic (e.g. social distancing).  This format is felt to be most appropriate for this patient at this time.  All issues noted in this document were discussed and addressed.

## 2019-01-12 ENCOUNTER — Telehealth: Payer: Self-pay | Admitting: Family Medicine

## 2019-01-12 ENCOUNTER — Other Ambulatory Visit: Payer: Self-pay

## 2019-01-12 ENCOUNTER — Ambulatory Visit (INDEPENDENT_AMBULATORY_CARE_PROVIDER_SITE_OTHER): Payer: Medicare Other | Admitting: Family Medicine

## 2019-01-12 ENCOUNTER — Encounter: Payer: Self-pay | Admitting: Family Medicine

## 2019-01-12 DIAGNOSIS — F1190 Opioid use, unspecified, uncomplicated: Secondary | ICD-10-CM

## 2019-01-12 DIAGNOSIS — M069 Rheumatoid arthritis, unspecified: Secondary | ICD-10-CM | POA: Diagnosis not present

## 2019-01-12 MED ORDER — HYDROCODONE-ACETAMINOPHEN 10-325 MG PO TABS: 1.0000 | ORAL_TABLET | Freq: Four times a day (QID) | ORAL | 0 refills | Status: DC | PRN

## 2019-01-12 MED ORDER — TRAMADOL HCL 50 MG PO TABS: ORAL_TABLET | ORAL | 5 refills | Status: DC

## 2019-01-12 MED ORDER — HYDROCODONE-ACETAMINOPHEN 10-325 MG PO TABS: 1.0000 | ORAL_TABLET | Freq: Four times a day (QID) | ORAL | 0 refills | Status: AC | PRN

## 2019-01-12 NOTE — Telephone Encounter (Signed)
Pt states she needs Dr Sarajane Jews to decrease her rx for Ensures from 12 to 45.  Please advise.

## 2019-01-12 NOTE — Progress Notes (Signed)
   Subjective:    Patient ID: [name], female    DOB: 04/15/1956, 63 y.o.   MRN: [id number]  HPI Virtual Visit via Telephone Note  I connected with the patient on 01/12/19 at  1:30 PM EDT by telephone and verified that I am speaking with the correct person using two identifiers. We attempted to connect virtually but we had technical difficulties with the audio and video.     I discussed the limitations, risks, security and privacy concerns of performing an evaluation and management service by telephone and the availability of in person appointments. I also discussed with the patient that there may be a patient responsible charge related to this service. The patient expressed understanding and agreed to proceed.  Location patient: home Location provider: work or home office Participants present for the call: patient, provider Patient did not have a visit in the prior 7 days to address this/these issue(s).   History of Present Illness: Here for pain management. She is doing well.  Indication for chronic opioid: rheumatoid arthritis Medication and dose: Norco 10-325 # pills per month: 120 Last UDS date: 07-21-18 Opioid Treatment Agreement signed (Y/N): 07-12-17 Opioid Treatment Agreement last reviewed with patient:  01-12-19 [facility] reviewed this encounter (include red flags): Yes    Observations/Objective: Patient sounds cheerful and well on the phone. I do not appreciate any SOB. Speech and thought processing are grossly intact. Patient reported vitals:  Assessment and Plan: Pain management, meds were refilled.  [name], MD   Follow Up Instructions:     [phone] 5-10 [phone] 11-20 [id number] 21-30 I did not refer this patient for an OV in the next 24 hours for this/these issue(s).  I discussed the assessment and treatment plan with the patient. The patient was provided an opportunity to ask questions and all were answered. The patient agreed with the plan and demonstrated  an understanding of the instructions.   The patient was advised to call back or seek an in-person evaluation if the symptoms worsen or if the condition fails to improve as anticipated.  I provided 12 minutes of non-face-to-face time during this encounter.   [name], MD    Review of Systems     Objective:   Physical Exam        Assessment & Plan:

## 2019-01-13 NOTE — Telephone Encounter (Signed)
Please advise 

## 2019-01-13 NOTE — Telephone Encounter (Signed)
I agree, please change the order to 70

## 2019-01-14 NOTE — Telephone Encounter (Signed)
Can you do this by phone?

## 2019-01-14 NOTE — Telephone Encounter (Signed)
Actually I have never "ordered" this per se. We likely got a fax from home health or some other agency that I signed off on. Sounds like they need to send me a corrected order form to sign

## 2019-01-14 NOTE — Telephone Encounter (Signed)
Patient has been made aware.

## 2019-01-26 ENCOUNTER — Other Ambulatory Visit: Payer: Self-pay | Admitting: Family Medicine

## 2019-01-30 DIAGNOSIS — Z933 Colostomy status: Secondary | ICD-10-CM | POA: Diagnosis not present

## 2019-02-09 DIAGNOSIS — M069 Rheumatoid arthritis, unspecified: Secondary | ICD-10-CM | POA: Diagnosis not present

## 2019-02-09 DIAGNOSIS — R5381 Other malaise: Secondary | ICD-10-CM | POA: Diagnosis not present

## 2019-02-09 DIAGNOSIS — K5720 Diverticulitis of large intestine with perforation and abscess without bleeding: Secondary | ICD-10-CM | POA: Diagnosis not present

## 2019-02-09 DIAGNOSIS — M0549 Rheumatoid myopathy with rheumatoid arthritis of multiple sites: Secondary | ICD-10-CM | POA: Diagnosis not present

## 2019-02-16 ENCOUNTER — Encounter: Payer: Self-pay | Admitting: Family Medicine

## 2019-02-16 DIAGNOSIS — M0579 Rheumatoid arthritis with rheumatoid factor of multiple sites without organ or systems involvement: Secondary | ICD-10-CM | POA: Diagnosis not present

## 2019-02-16 DIAGNOSIS — M1A09X0 Idiopathic chronic gout, multiple sites, without tophus (tophi): Secondary | ICD-10-CM | POA: Diagnosis not present

## 2019-02-16 DIAGNOSIS — C642 Malignant neoplasm of left kidney, except renal pelvis: Secondary | ICD-10-CM | POA: Diagnosis not present

## 2019-02-16 DIAGNOSIS — Z79899 Other long term (current) drug therapy: Secondary | ICD-10-CM | POA: Diagnosis not present

## 2019-02-16 DIAGNOSIS — M2550 Pain in unspecified joint: Secondary | ICD-10-CM | POA: Diagnosis not present

## 2019-02-20 ENCOUNTER — Other Ambulatory Visit: Payer: Self-pay | Admitting: Family Medicine

## 2019-02-23 DIAGNOSIS — H7292 Unspecified perforation of tympanic membrane, left ear: Secondary | ICD-10-CM | POA: Diagnosis not present

## 2019-02-23 DIAGNOSIS — Z974 Presence of external hearing-aid: Secondary | ICD-10-CM | POA: Diagnosis not present

## 2019-02-23 DIAGNOSIS — H6123 Impacted cerumen, bilateral: Secondary | ICD-10-CM | POA: Diagnosis not present

## 2019-02-23 DIAGNOSIS — Z87891 Personal history of nicotine dependence: Secondary | ICD-10-CM | POA: Diagnosis not present

## 2019-02-23 DIAGNOSIS — H9193 Unspecified hearing loss, bilateral: Secondary | ICD-10-CM | POA: Diagnosis not present

## 2019-02-27 DIAGNOSIS — Z933 Colostomy status: Secondary | ICD-10-CM | POA: Diagnosis not present

## 2019-03-03 NOTE — Progress Notes (Signed)
Virtual Visit via Telephone Note   This visit type was conducted due to national recommendations for restrictions regarding the COVID-19 Pandemic (e.g. social distancing) in an effort to limit this patient's exposure and mitigate transmission in our community.  Due to her co-morbid illnesses, this patient is at least at moderate risk for complications without adequate follow up.  This format is felt to be most appropriate for this patient at this time.  The patient did not have access to video technology/had technical difficulties with video requiring transitioning to audio format only (telephone).  All issues noted in this document were discussed and addressed.  No physical exam could be performed with this format.  Please refer to the patient's chart for her  consent to telehealth for [facility].   Date:  03/04/2019   ID:  [name], DOB 06-04-1956, MRN [id number]  Patient Location: Home Provider Location: Home  PCP:  [name], MD  Cardiologist:  No primary care provider on file.  Electrophysiologist:  None   Evaluation Performed:  Follow-Up Visit  Chief Complaint: Heart failure/dyspnea.  History of Present Illness:    [name] is a 63 y.o. female with history of atrial flutter, tachycardia induced LV systolic dysfunction recovered to normal, prior stroke, rheumatoid arthritis, essential hypertension, and prior history of alcohol abuse.  Question Marfan's.  The patient has a prior history of atrial flutter and underwent flutter ablation by Dr. Beckie Salts.  She has not been seen for cardiology follow-up in quite some time.  She has known chronic combined systolic and diastolic heart failure.  In speaking with her today by telephone virtual encounter, she complains of intermittent shortness of breath, occasional orthopnea, but no lower extremity swelling.  She assumes that the shortness of breath is related to an abdominal hernia at the site of her colostomy.  She has  not had chest pain, racing heart, syncope.  She ambulates with a walker.  She has multiple other medical problems including rheumatoid arthritis and possibly Marfan's.  She no longer smokes.  She discontinued alcohol use years ago.  Currently abstinent from both.    The patient does not have symptoms concerning for COVID-19 infection (fever, chills, cough, or new shortness of breath).    Past Medical History:  Diagnosis Date  . Atrial flutter ([place])   . Chronic gout 2008  . Diverticulitis of large intestine with perforation   . Dysrhythmia   . Fibroid   . Hearing aid worn    pt wears bilateral hearing aids  . Hernia 4/08  . Hx of adenomatous polyp of colon 11/13/2016  . Hypertension   . Legally blind    since pt was a teenager  . Marfan's syndrome affecting skin    with scolosis  . Osteoporosis   . 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 Rheumatoid arthritis [facility])    sees Dr. Gavin Pound   . SBO (small bowel obstruction) ([place]) 01/05/2013  . Small bowel obstruction due to adhesions ([place])   . Status post bunionectomy 1/10   bilateral   . Stroke ([place]) 2/08  . Substance abuse ([place])    recovering alcoholic [id number] years   . 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 ABLATION  01/04/14   atrial flutter ablation (2 circuits) by Dr Lovena Le  . ATRIAL FLUTTER ABLATION N/A 01/04/2014   Procedure: ATRIAL FLUTTER ABLATION;  Surgeon: Evans Lance,  MD;  Location: [facility] CATH LAB;  Service: Cardiovascular;  Laterality: N/A;  . BUNIONECTOMY Bilateral 1/10  . COLON SURGERY     colectomy  . COLOSTOMY  11/27/2012  . HERNIA REPAIR    . IR RADIOLOGIST EVAL & MGMT  10/01/2017  . IR RADIOLOGIST EVAL & MGMT  11/21/2017  . IR RADIOLOGIST EVAL & MGMT  02/13/2018  . IR RADIOLOGIST EVAL & MGMT  01/08/2019  . LAPAROSCOPIC ABDOMINAL EXPLORATION N/A 01/06/2013   Procedure: LAPAROSCOPIC ABDOMINAL EXPLORATION;  Surgeon: Adin Hector, MD;  Location: [facility]  ORS;  Service: General;  Laterality: N/A;  . LAPAROSCOPIC LYSIS OF ADHESIONS N/A 01/06/2013   Procedure: LAPAROSCOPIC LYSIS OF ADHESIONS/ INTEROTOMY REPAIR;  Surgeon: Adin Hector, MD;  Location: [facility] ORS;  Service: General;  Laterality: N/A;  . LAPAROTOMY N/A 11/27/2012   Procedure: EXPLORATORY LAPAROTOMY SIGMOID COLECTOMY, COLOSTOMY;  Surgeon: Madilyn Hook, DO;  Location: [facility] ORS;  Service: General;  Laterality: N/A;  . RADIOLOGY WITH ANESTHESIA Left 10/23/2017   Procedure: CT RENAL CRYO AND BIOPSY;  Surgeon: Aletta Edouard, MD;  Location: [facility] ORS;  Service: Radiology;  Laterality: Left;  . TOTAL KNEE ARTHROPLASTY Bilateral 6/08     Current Meds  Medication Sig  . Abatacept (ORENCIA) 125 MG/ML SOSY Inject 125 mg into the skin every Monday.   Marland Kitchen albuterol (PROVENTIL HFA;VENTOLIN HFA) 108 (90 Base) MCG/ACT inhaler Inhale 1 puff into the lungs every 4 (four) hours as needed for wheezing or shortness of breath.  . allopurinol (ZYLOPRIM) 100 MG tablet TAKE 1 TABLET(100 MG) BY MOUTH DAILY  . amLODipine (NORVASC) 10 MG tablet TAKE 1 TABLET(10 MG) BY MOUTH DAILY  . aspirin EC 81 MG tablet Take 1 tablet (81 mg total) by mouth daily.  Marland Kitchen CALCIUM PO Take 1 tablet by mouth 2 (two) times daily.  . cetirizine (ZYRTEC) 10 MG tablet Take 1 tablet (10 mg total) by mouth daily.  . chlorhexidine (PERIDEX) 0.12 % solution Use as directed 10 mLs in the mouth or throat daily.   . Cholecalciferol (VITAMIN D) 2000 units CAPS Take 2,000 Units by mouth daily.  Marland Kitchen CIPRODEX OTIC suspension INSTILL 4 DROPS UNTO EACH EAR TWICE DAILY AS NEEDED  . diclofenac sodium (VOLTAREN) 1 % GEL Apply 1 application topically 4 (four) times daily as needed (arthritis).  Marland Kitchen docusate sodium (COLACE) 100 MG capsule Take 100 mg by mouth 2 (two) times daily.  Marland Kitchen esomeprazole (NEXIUM) 40 MG capsule Take 1 capsule (40 mg total) by mouth daily.  . folic acid (FOLVITE) A999333 MCG tablet Take 400 mcg by mouth daily.  Derrill Memo ON 03/15/2019]  HYDROcodone-acetaminophen (NORCO) 10-325 MG tablet Take 1 tablet by mouth every 6 (six) hours as needed for severe pain.  Marland Kitchen ketoconazole (NIZORAL) 2 % cream Apply 1 application topically daily as needed for irritation.  . megestrol (MEGACE ES) 625 MG/5ML suspension take 5 milliliters by mouth three times daily before meals as needed for appetite  . Methotrexate Sodium (METHOTREXATE, PF,) 50 MG/2ML injection Inject 0.5 mLs into the muscle every Monday.  . metoCLOPramide (REGLAN) 10 MG tablet Take 1 tablet (10 mg total) by mouth every 6 (six) hours.  . Multiple Vitamin (MULTIVITAMIN WITH MINERALS) TABS tablet Take 1 tablet by mouth daily.  . polyethylene glycol (MIRALAX / GLYCOLAX) 17 g packet Take 17 g by mouth daily as needed for mild constipation.  Marland Kitchen tiZANidine (ZANAFLEX) 4 MG tablet TAKE 1 TABLET BY MOUTH TWICE DAILY  . traMADol (ULTRAM) 50 MG tablet TAKE 1-2  TABLETS BY MOUTH EVERY 6 HOURS AS NEEDED  . triamcinolone (KENALOG) 0.025 % cream Apply 1 application topically daily as needed (itching).  . vitamin B-12 (CYANOCOBALAMIN) 500 MCG tablet Take 500 mcg by mouth daily.  . vitamin C (ASCORBIC ACID) 500 MG tablet Take 500 mg by mouth daily.      Allergies:   Spinach   Social History   Tobacco Use  . Smoking status: Former Smoker    Packs/day: 1.00    Types: Cigarettes    Quit date: 11/27/2012    Years since quitting: 6.2  . Smokeless tobacco: Never Used  Substance Use Topics  . Alcohol use: No    Alcohol/week: 0.0 standard drinks    Comment: recovering alcoholic sober since 0000000  . Drug use: No     Family Hx: The patient's family history is negative for Heart attack and Colon cancer.  ROS:   Please see the history of present illness.    Concerned that she may not be able to have hernia repair at the site of colostomy because of her medical history background.  States that the doctors have not been helpful in trying to move forward with surgery.  She is worried that she may be  having a stroke since she has left arm pain frequently.  She also has neck and lower back pain. All other systems reviewed and are negative.   Prior CV studies:   The following studies were reviewed today:  Last cardiac evaluation included an echocardiogram 03/2016: Study Conclusions  - Left ventricle: The cavity size was normal. Systolic function was   mildly to moderately reduced. The estimated ejection fraction was   in the range of 40% to 45%. Diffuse hypokinesis. Features are   consistent with a pseudonormal left ventricular filling pattern,   with concomitant abnormal relaxation and increased filling   pressure (grade 2 diastolic dysfunction). - Mitral valve: There was mild regurgitation directed eccentrically   and posteriorly. - Tricuspid valve: There was mild regurgitation.  Labs/Other Tests and Data Reviewed:    EKG:  Performed on 10/22/2017 demonstrated normal sinus rhythm, leftward axis, inverted T waves in 3 and aVF.  Poor R wave progression.  No acute change was noted.  Recent Labs: 01/05/2019: Creatinine, Ser 0.80   Recent Lipid Panel Lab Results  Component Value Date/Time   CHOL 142 12/02/2012 03:56 AM   TRIG 132 01/06/2013 05:12 AM   HDL 46 12/02/2012 03:56 AM   CHOLHDL 3.1 12/02/2012 03:56 AM   LDLCALC 74 12/02/2012 03:56 AM    Wt Readings from Last 3 Encounters:  03/04/19 150 lb (68 kg)  02/13/18 150 lb (68 kg)  10/22/17 105 lb (47.6 kg)     Objective:    Vital Signs:  BP 117/77   Pulse 75   Ht 5\' 8"  (1.727 m)   Wt 150 lb (68 kg)   LMP 06/25/2006 (LMP Unknown)   BMI 22.81 kg/m    VITAL SIGNS:  reviewed  ASSESSMENT & PLAN:    1. Marfan's syndrome   2. Chronic combined systolic and diastolic heart failure ([place])   3. Typical atrial flutter ([place])   4. Essential hypertension   5. Pulmonary HTN ([place])   6. Educated About Covid-19 Virus Infection    PLAN:  1. Unable to assess as there has been no recent echo to look at aortic root size. 2.  With recent dyspnea, will reassess 2D Doppler echocardiogram to rule out systolic dysfunction or some other significant change  in status. 3. No clinical data to suggest atrial flutter. 4. Blood pressure obtained today is very good. 5. Echocardiogram will help assess for presence of pulmonary hypertension. 6. Social distancing is encouraged.  Mask wearing and handwashing is a must.  COVID-19 Education: The signs and symptoms of COVID-19 were discussed with the patient and how to seek care for testing (follow up with PCP or arrange E-visit).  The importance of social distancing was discussed today.  Time:   Today, I have spent 15 minutes with the patient with telehealth technology discussing the above problems.     Medication Adjustments/Labs and Tests Ordered: Current medicines are reviewed at length with the patient today.  Concerns regarding medicines are outlined above.   Tests Ordered: No orders of the defined types were placed in this encounter.   Medication Changes: No orders of the defined types were placed in this encounter.   Follow Up:  In Person in 6 month(s)  Signed, Sinclair Grooms, MD  03/04/2019 9:11 AM    [place]

## 2019-03-04 ENCOUNTER — Telehealth (INDEPENDENT_AMBULATORY_CARE_PROVIDER_SITE_OTHER): Payer: Medicare Other | Admitting: Interventional Cardiology

## 2019-03-04 ENCOUNTER — Encounter: Payer: Self-pay | Admitting: Interventional Cardiology

## 2019-03-04 ENCOUNTER — Other Ambulatory Visit: Payer: Self-pay

## 2019-03-04 VITALS — BP 117/77 | HR 75 | Ht 68.0 in | Wt 150.0 lb

## 2019-03-04 DIAGNOSIS — Q8740 Marfan's syndrome, unspecified: Secondary | ICD-10-CM | POA: Diagnosis not present

## 2019-03-04 DIAGNOSIS — I483 Typical atrial flutter: Secondary | ICD-10-CM

## 2019-03-04 DIAGNOSIS — I5042 Chronic combined systolic (congestive) and diastolic (congestive) heart failure: Secondary | ICD-10-CM

## 2019-03-04 DIAGNOSIS — I10 Essential (primary) hypertension: Secondary | ICD-10-CM

## 2019-03-04 DIAGNOSIS — I272 Pulmonary hypertension, unspecified: Secondary | ICD-10-CM

## 2019-03-04 DIAGNOSIS — Z7189 Other specified counseling: Secondary | ICD-10-CM

## 2019-03-04 NOTE — Patient Instructions (Signed)
Medication Instructions:  Your physician recommends that you continue on your current medications as directed. Please refer to the Current Medication list given to you today.  If you need a refill on your cardiac medications before your next appointment, please call your pharmacy.   Lab work: None If you have labs (blood work) drawn today and your tests are completely normal, you will receive your results only by: Marland Kitchen MyChart Message (if you have MyChart) OR . A paper copy in the mail If you have any lab test that is abnormal or we need to change your treatment, we will call you to review the results.  Testing/Procedures: Your physician has requested that you have an echocardiogram. Echocardiography is a painless test that uses sound waves to create images of your heart. It provides your doctor with information about the size and shape of your heart and how well your heart's chambers and valves are working. This procedure takes approximately one hour. There are no restrictions for this procedure.   Follow-Up: At [facility], you and your health needs are our priority.  As part of our continuing mission to provide you with exceptional heart care, we have created designated Provider Care Teams.  These Care Teams include your primary Cardiologist (physician) and Advanced Practice Providers (APPs -  Physician Assistants and Nurse Practitioners) who all work together to provide you with the care you need, when you need it. You will need a follow up appointment in 6 months.  Please call our office 2 months in advance to schedule this appointment.  You may see Dr. Tamala Julian or one of the following Advanced Practice Providers on your designated Care Team:   Truitt Merle, NP Cecilie Kicks, NP . Kathyrn Drown, NP  Any Other Special Instructions Will Be Listed Below (If Applicable).

## 2019-03-09 ENCOUNTER — Telehealth: Payer: Self-pay | Admitting: Internal Medicine

## 2019-03-09 ENCOUNTER — Telehealth: Payer: Self-pay

## 2019-03-09 DIAGNOSIS — K838 Other specified diseases of biliary tract: Secondary | ICD-10-CM

## 2019-03-09 DIAGNOSIS — K8689 Other specified diseases of pancreas: Secondary | ICD-10-CM

## 2019-03-09 NOTE — Telephone Encounter (Signed)
-----   Message from Gatha Mayer, MD sent at 03/09/2019  2:23 PM EDT ----- Regarding: Needs f/u visit In July CT scan showed increasing bile duct and pancreatic duct enlargement  I would like her to do LFT's and lipase and see me next available please  Thanks

## 2019-03-09 NOTE — Telephone Encounter (Signed)
Patient notified of recommendations She will come for labs as soon as she can.  She did report may be another week or so before she can come.  She understands to come M-F 7:30-5

## 2019-03-09 NOTE — Telephone Encounter (Signed)
Have you seen the labs from Dr. Trudie Reed?

## 2019-03-13 NOTE — Telephone Encounter (Signed)
I have not to the best of my knowledge - let's ask them to send/resend and then after I review we can decide if she needs more labs

## 2019-03-13 NOTE — Telephone Encounter (Signed)
I left a detailed message for the patient that we do not have the labs and asked that she contact her office and have them sent.

## 2019-03-16 DIAGNOSIS — L218 Other seborrheic dermatitis: Secondary | ICD-10-CM | POA: Diagnosis not present

## 2019-03-17 DIAGNOSIS — M0549 Rheumatoid myopathy with rheumatoid arthritis of multiple sites: Secondary | ICD-10-CM | POA: Diagnosis not present

## 2019-03-30 ENCOUNTER — Other Ambulatory Visit: Payer: Self-pay

## 2019-03-30 ENCOUNTER — Ambulatory Visit (HOSPITAL_COMMUNITY): Payer: Medicare Other | Attending: Cardiology

## 2019-03-30 DIAGNOSIS — I5042 Chronic combined systolic (congestive) and diastolic (congestive) heart failure: Secondary | ICD-10-CM

## 2019-03-31 DIAGNOSIS — Z933 Colostomy status: Secondary | ICD-10-CM | POA: Diagnosis not present

## 2019-04-02 ENCOUNTER — Telehealth: Payer: Self-pay | Admitting: Interventional Cardiology

## 2019-04-02 NOTE — Telephone Encounter (Signed)
Per pt call stated she was returning this office call for the Echo results.   Please give her a call back.

## 2019-04-02 NOTE — Telephone Encounter (Signed)
Pt called back and has been notified of echo results by phone with verbal understanding. Pt thanked me for the call. The patient has been notified of the result and verbalized understanding. All questions (if any) were answered.  Julaine Hua, [place] 04/02/2019 2:21 PM

## 2019-04-05 NOTE — Telephone Encounter (Signed)
I did see the labs and her LFT's are normal so she does not need to do labs with [place].  However her bile duct and pancreas duct are dilated and we do not know why. It is unlikely but possible that a tumor (? Cancer) could cause this.  Because of that I think she should do an MRCP with and without contrast  Dx dilated bile duct and dilated pancreatic duct  May need renal function checked.  Please call her and explain  I can call if she needs me to

## 2019-04-06 NOTE — Telephone Encounter (Signed)
Patient notified She is aware to come for labs prior to the MRI She has been scheduled for [facility] at 12:00 04/27/19.  She needs to arrive at 11:30 and be NPO for 4 hours prior

## 2019-04-16 DIAGNOSIS — M0549 Rheumatoid myopathy with rheumatoid arthritis of multiple sites: Secondary | ICD-10-CM | POA: Diagnosis not present

## 2019-04-17 ENCOUNTER — Ambulatory Visit (HOSPITAL_COMMUNITY): Payer: Medicare Other

## 2019-04-20 ENCOUNTER — Telehealth: Payer: Self-pay | Admitting: Internal Medicine

## 2019-04-20 ENCOUNTER — Ambulatory Visit (INDEPENDENT_AMBULATORY_CARE_PROVIDER_SITE_OTHER): Payer: Medicare Other | Admitting: Family Medicine

## 2019-04-20 ENCOUNTER — Encounter: Payer: Self-pay | Admitting: Family Medicine

## 2019-04-20 ENCOUNTER — Other Ambulatory Visit: Payer: Self-pay

## 2019-04-20 VITALS — BP 112/72 | HR 88 | Temp 97.7°F

## 2019-04-20 DIAGNOSIS — F1190 Opioid use, unspecified, uncomplicated: Secondary | ICD-10-CM

## 2019-04-20 DIAGNOSIS — M069 Rheumatoid arthritis, unspecified: Secondary | ICD-10-CM

## 2019-04-20 DIAGNOSIS — I10 Essential (primary) hypertension: Secondary | ICD-10-CM | POA: Diagnosis not present

## 2019-04-20 DIAGNOSIS — Z23 Encounter for immunization: Secondary | ICD-10-CM | POA: Diagnosis not present

## 2019-04-20 LAB — BASIC METABOLIC PANEL
BUN: 10 mg/dL (ref 6–23)
CO2: 30 mEq/L (ref 19–32)
Calcium: 9.4 mg/dL (ref 8.4–10.5)
Chloride: 105 mEq/L (ref 96–112)
Creatinine, Ser: 0.67 mg/dL (ref 0.40–1.20)
GFR: 107.53 mL/min (ref >60.00)
Glucose, Bld: 102 mg/dL — ABNORMAL HIGH (ref 70–99)
Potassium: 4.2 mEq/L (ref 3.5–5.1)
Sodium: 142 mEq/L (ref 135–145)

## 2019-04-20 MED ORDER — HYDROCODONE-ACETAMINOPHEN 10-325 MG PO TABS: 1.0000 | ORAL_TABLET | Freq: Four times a day (QID) | ORAL | 0 refills | Status: DC | PRN

## 2019-04-20 NOTE — Telephone Encounter (Signed)
Her renal fx is ok for the MRI  Please see if they can run a lipase on that blood also If not no worries

## 2019-04-20 NOTE — Progress Notes (Signed)
   Subjective:    Patient ID: [name], female    DOB: 05-Oct-1955, 63 y.o.   MRN: [id number]  HPI Here for pain management, she is doing well.  Indication for chronic opioid: rheumatoid arthritis  Medication and dose: Norco 10-325  # pills per month: 120 Last UDS date: 07-21-18 Opioid Treatment Agreement signed (Y/N): 07-12-17 Opioid Treatment Agreement last reviewed with patient:  04-20-19 NCCSRS reviewed this encounter (include red flags): Yes    Review of Systems  Constitutional: Negative.   Respiratory: Negative.   Cardiovascular: Negative.   Musculoskeletal: Positive for arthralgias.       Objective:   Physical Exam Constitutional:      Comments: [name]   Cardiovascular:     Rate and Rhythm: Normal rate and regular rhythm.     Pulses: Normal pulses.     Heart sounds: Normal heart sounds.  Pulmonary:     Effort: Pulmonary effort is normal.     Breath sounds: Normal breath sounds.  Neurological:     Mental Status: She is alert.           Assessment & Plan:  Pain management, meds were refilled.  Christina Penna, MD

## 2019-04-20 NOTE — Telephone Encounter (Signed)
Dr. Carlean Purl told the patient to come and get blood work done but [name] went to Dr. Sarajane Jews today and he did her blood work and he is supposed to be sending over the lab work.

## 2019-04-20 NOTE — Telephone Encounter (Signed)
Dr. Carlean Purl please see labs done with Dr. Sarajane Jews

## 2019-04-21 NOTE — Telephone Encounter (Signed)
Cannot be added.   Patient advised

## 2019-04-27 ENCOUNTER — Encounter (HOSPITAL_COMMUNITY): Payer: Self-pay | Admitting: Radiology

## 2019-04-27 ENCOUNTER — Ambulatory Visit (HOSPITAL_COMMUNITY)
Admission: RE | Admit: 2019-04-27 | Discharge: 2019-04-27 | Disposition: A | Discharge status: Home / Self Care | Payer: Medicare Other | Source: Ambulatory Visit | Attending: Internal Medicine | Admitting: Internal Medicine

## 2019-04-27 ENCOUNTER — Other Ambulatory Visit: Payer: Self-pay

## 2019-04-27 DIAGNOSIS — K838 Other specified diseases of biliary tract: Secondary | ICD-10-CM | POA: Diagnosis not present

## 2019-04-27 DIAGNOSIS — K8689 Other specified diseases of pancreas: Secondary | ICD-10-CM

## 2019-04-27 DIAGNOSIS — R932 Abnormal findings on diagnostic imaging of liver and biliary tract: Secondary | ICD-10-CM | POA: Diagnosis not present

## 2019-04-27 DIAGNOSIS — R109 Unspecified abdominal pain: Secondary | ICD-10-CM | POA: Diagnosis not present

## 2019-04-27 DIAGNOSIS — R935 Abnormal findings on diagnostic imaging of other abdominal regions, including retroperitoneum: Secondary | ICD-10-CM | POA: Diagnosis not present

## 2019-04-27 MED ORDER — GADOBUTROL 1 MMOL/ML IV SOLN
7.0000 mL | Freq: Once | INTRAVENOUS | Status: AC | PRN
  Administered 2019-04-27: 7 mL via INTRAVENOUS

## 2019-04-30 ENCOUNTER — Other Ambulatory Visit: Payer: Self-pay | Admitting: Family Medicine

## 2019-04-30 DIAGNOSIS — Z933 Colostomy status: Secondary | ICD-10-CM | POA: Diagnosis not present

## 2019-05-01 NOTE — Progress Notes (Signed)
Nothing bad here stable mild dilated CBD and PD  Please get a sx updtae

## 2019-05-01 NOTE — Progress Notes (Signed)
She can see [facility]  Looks like she is known to Dr. Johney Maine at least   Explain that she and surgeon would have to decide if it should be fixed  Glad she is ok otherwise

## 2019-05-08 DIAGNOSIS — Z933 Colostomy status: Secondary | ICD-10-CM | POA: Diagnosis not present

## 2019-05-18 DIAGNOSIS — M0549 Rheumatoid myopathy with rheumatoid arthritis of multiple sites: Secondary | ICD-10-CM | POA: Diagnosis not present

## 2019-05-18 DIAGNOSIS — Z79899 Other long term (current) drug therapy: Secondary | ICD-10-CM | POA: Diagnosis not present

## 2019-05-18 DIAGNOSIS — M1A09X0 Idiopathic chronic gout, multiple sites, without tophus (tophi): Secondary | ICD-10-CM | POA: Diagnosis not present

## 2019-05-18 DIAGNOSIS — C642 Malignant neoplasm of left kidney, except renal pelvis: Secondary | ICD-10-CM | POA: Diagnosis not present

## 2019-05-18 DIAGNOSIS — M0579 Rheumatoid arthritis with rheumatoid factor of multiple sites without organ or systems involvement: Secondary | ICD-10-CM | POA: Diagnosis not present

## 2019-05-18 DIAGNOSIS — M2550 Pain in unspecified joint: Secondary | ICD-10-CM | POA: Diagnosis not present

## 2019-06-10 DIAGNOSIS — Z933 Colostomy status: Secondary | ICD-10-CM | POA: Diagnosis not present

## 2019-07-01 ENCOUNTER — Telehealth: Payer: Self-pay | Admitting: Family Medicine

## 2019-07-01 NOTE — Telephone Encounter (Signed)
Joy with [employer] is calling in on pt's behalf, they faxed over a request for Rx for pt's ensure. Provided fax # for refax, please assist    Please fax #: [phone] ATTN  Phone: [phone]

## 2019-07-06 NOTE — Telephone Encounter (Signed)
Pt calling to check status  °

## 2019-07-06 NOTE — Telephone Encounter (Deleted)
Spoke with patient. Appointment was scheduled.

## 2019-07-07 NOTE — Telephone Encounter (Signed)
No I haven't seen anything yet

## 2019-07-08 NOTE — Telephone Encounter (Signed)
Pt calling again to check on this

## 2019-07-08 NOTE — Telephone Encounter (Signed)
Forms have been completed and faxed back. Left a detailed message on verified voice mail.  Nothing further needed.

## 2019-07-14 NOTE — Telephone Encounter (Signed)
[employer] agent called pt states she would like to speak to the CMA of Dr. Barbie Banner regarding the fax encounter.

## 2019-07-16 ENCOUNTER — Other Ambulatory Visit: Payer: Self-pay | Admitting: Family Medicine

## 2019-07-16 DIAGNOSIS — Z933 Colostomy status: Secondary | ICD-10-CM | POA: Diagnosis not present

## 2019-07-20 ENCOUNTER — Telehealth (INDEPENDENT_AMBULATORY_CARE_PROVIDER_SITE_OTHER): Payer: Medicare Other | Admitting: Family Medicine

## 2019-07-20 ENCOUNTER — Other Ambulatory Visit: Payer: Self-pay

## 2019-07-20 DIAGNOSIS — F1190 Opioid use, unspecified, uncomplicated: Secondary | ICD-10-CM

## 2019-07-20 DIAGNOSIS — M069 Rheumatoid arthritis, unspecified: Secondary | ICD-10-CM

## 2019-07-20 MED ORDER — HYDROCODONE-ACETAMINOPHEN 10-325 MG PO TABS: 1.0000 | ORAL_TABLET | Freq: Four times a day (QID) | ORAL | 0 refills | Status: AC | PRN

## 2019-07-20 MED ORDER — HYDROCODONE-ACETAMINOPHEN 10-325 MG PO TABS: 1.0000 | ORAL_TABLET | Freq: Four times a day (QID) | ORAL | 0 refills | Status: DC | PRN

## 2019-07-20 NOTE — Progress Notes (Signed)
Virtual Visit via Telephone Note  I connected with the patient on 07/20/19 at  1:30 PM EST by telephone and verified that I am speaking with the correct person using two identifiers.   I discussed the limitations, risks, security and privacy concerns of performing an evaluation and management service by telephone and the availability of in person appointments. I also discussed with the patient that there may be a patient responsible charge related to this service. The patient expressed understanding and agreed to proceed.  Location patient: home Location provider: work or home office Participants present for the call: patient, provider Patient did not have a visit in the prior 7 days to address this/these issue(s).   History of Present Illness: Here for pain management, she is doing well.  Indication for chronic opioid: rheumatoid arthritis Medication and dose: Norco 10-325 # pills per month: 120 Last UDS date: 07-21-18 Opioid Treatment Agreement signed (Y/N): 07-12-17 Opioid Treatment Agreement last reviewed with patient:  07-20-19 NCCSRS reviewed this encounter (include red flags): Yes    Observations/Objective: Patient sounds cheerful and well on the phone. I do not appreciate any SOB. Speech and thought processing are grossly intact. Patient reported vitals:  Assessment and Plan:   Follow Up Instructions:     [id number] 5-10 [id number] 21-30 I did not refer this patient for an OV in the next 24 hours for this/these issue(s).  I discussed the assessment and treatment plan with the patient. The patient was provided an opportunity to ask questions and all were answered. The patient agreed with the plan and demonstrated an understanding of the instructions.   The patient was advised to call back or seek an in-person evaluation if the symptoms worsen or if the condition fails to improve as anticipated.  I provided 14 minutes of non-face-to-face time during this  encounter.   [name], MD

## 2019-08-06 ENCOUNTER — Telehealth: Payer: Self-pay | Admitting: Family Medicine

## 2019-08-06 NOTE — Telephone Encounter (Signed)
Medication Refill: Tramadol Pharmacy: [facility] Fax: [phone]

## 2019-08-07 MED ORDER — TRAMADOL HCL 50 MG PO TABS: ORAL_TABLET | ORAL | 5 refills | Status: DC

## 2019-08-07 NOTE — Telephone Encounter (Signed)
Done

## 2019-08-07 NOTE — Telephone Encounter (Signed)
Filled 01/12/2019 Last OV 07/20/2019  Ok to fill?

## 2019-08-07 NOTE — Telephone Encounter (Signed)
Patient is aware 

## 2019-08-11 DIAGNOSIS — Z933 Colostomy status: Secondary | ICD-10-CM | POA: Diagnosis not present

## 2019-08-18 DIAGNOSIS — M0579 Rheumatoid arthritis with rheumatoid factor of multiple sites without organ or systems involvement: Secondary | ICD-10-CM | POA: Diagnosis not present

## 2019-08-18 DIAGNOSIS — Z79899 Other long term (current) drug therapy: Secondary | ICD-10-CM | POA: Diagnosis not present

## 2019-08-18 DIAGNOSIS — M1A09X0 Idiopathic chronic gout, multiple sites, without tophus (tophi): Secondary | ICD-10-CM | POA: Diagnosis not present

## 2019-08-18 DIAGNOSIS — C642 Malignant neoplasm of left kidney, except renal pelvis: Secondary | ICD-10-CM | POA: Diagnosis not present

## 2019-08-18 DIAGNOSIS — M2550 Pain in unspecified joint: Secondary | ICD-10-CM | POA: Diagnosis not present

## 2019-08-28 DIAGNOSIS — M069 Rheumatoid arthritis, unspecified: Secondary | ICD-10-CM | POA: Diagnosis not present

## 2019-08-28 DIAGNOSIS — K5720 Diverticulitis of large intestine with perforation and abscess without bleeding: Secondary | ICD-10-CM | POA: Diagnosis not present

## 2019-08-28 DIAGNOSIS — M0549 Rheumatoid myopathy with rheumatoid arthritis of multiple sites: Secondary | ICD-10-CM | POA: Diagnosis not present

## 2019-08-28 DIAGNOSIS — R5381 Other malaise: Secondary | ICD-10-CM | POA: Diagnosis not present

## 2019-09-07 DIAGNOSIS — C649 Malignant neoplasm of unspecified kidney, except renal pelvis: Secondary | ICD-10-CM | POA: Diagnosis not present

## 2019-09-07 DIAGNOSIS — C642 Malignant neoplasm of left kidney, except renal pelvis: Secondary | ICD-10-CM | POA: Diagnosis not present

## 2019-09-07 DIAGNOSIS — N3000 Acute cystitis without hematuria: Secondary | ICD-10-CM | POA: Diagnosis not present

## 2019-09-10 DIAGNOSIS — Z933 Colostomy status: Secondary | ICD-10-CM | POA: Diagnosis not present

## 2019-09-14 DIAGNOSIS — C642 Malignant neoplasm of left kidney, except renal pelvis: Secondary | ICD-10-CM | POA: Diagnosis not present

## 2019-09-16 ENCOUNTER — Encounter: Payer: Self-pay | Admitting: Certified Nurse Midwife

## 2019-09-16 NOTE — Progress Notes (Deleted)
CARDIOLOGY OFFICE NOTE  Date:  09/16/2019    [name] Date of Birth: 1955-08-04 Medical Record [id number]  PCP:  [name], MD  Cardiologist:  Clearence Cheek    No chief complaint on file.   History of Present Illness: [name] is a 64 y.o. female who presents today for a *** with history of atrial flutter, tachycardia induced LV systolic dysfunction recovered to normal, prior stroke, rheumatoid arthritis, essential hypertension, and prior history of alcohol abuse.  Question Marfan's.  The patient has a prior history of atrial flutter and underwent flutter ablation by Dr. Beckie Salts.  She has not been seen for cardiology follow-up in quite some time.  She has known chronic combined systolic and diastolic heart failure.  In speaking with her today by telephone virtual encounter, she complains of intermittent shortness of breath, occasional orthopnea, but no lower extremity swelling.  She assumes that the shortness of breath is related to an abdominal hernia at the site of her colostomy.  She has not had chest pain, racing heart, syncope.  She ambulates with a walker.  She has multiple other medical problems including rheumatoid arthritis and possibly Marfan's.  She no longer smokes.  She discontinued alcohol use years ago.  Currently abstinent from both.    The patient {does/does not:200015} have symptoms concerning for COVID-19 infection (fever, chills, cough, or new shortness of breath).   Comes in today. Here with   Past Medical History:  Diagnosis Date  . Atrial flutter ([place])   . Chronic gout 2008  . Diverticulitis of large intestine with perforation   . Dysrhythmia   . Fibroid   . Hearing aid worn    pt wears bilateral hearing aids  . Hernia 4/08  . Hx of adenomatous polyp of colon 11/13/2016  . Hypertension   . Legally blind    since pt was a teenager  . Marfan's syndrome affecting skin    with scolosis  . Osteoporosis   . 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 Rheumatoid arthritis [facility])    sees Dr. Gavin Pound   . SBO (small bowel obstruction) ([place]) 01/05/2013  . Small bowel obstruction due to adhesions ([place])   . Status post bunionectomy 1/10   bilateral   . Stroke ([place]) 2/08  . Substance abuse ([place])    recovering alcoholic [id number] years   . 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 ABLATION  01/04/14   atrial flutter ablation (2 circuits) by Dr Lovena Le  . ATRIAL FLUTTER ABLATION N/A 01/04/2014   Procedure: ATRIAL FLUTTER ABLATION;  Surgeon: Evans Lance, MD;  Location: [facility] CATH LAB;  Service: Cardiovascular;  Laterality: N/A;  . BUNIONECTOMY Bilateral 1/10  . COLON SURGERY     colectomy  . COLOSTOMY  11/27/2012  . HERNIA REPAIR    . IR RADIOLOGIST EVAL & MGMT  10/01/2017  . IR RADIOLOGIST EVAL & MGMT  11/21/2017  . IR RADIOLOGIST EVAL & MGMT  02/13/2018  . IR RADIOLOGIST EVAL & MGMT  01/08/2019  . LAPAROSCOPIC ABDOMINAL EXPLORATION N/A 01/06/2013   Procedure: LAPAROSCOPIC ABDOMINAL EXPLORATION;  Surgeon: Adin Hector, MD;  Location: [facility] OR[facility];  Service: General;  Laterality: N/A;  . LAPAROSCOPIC LYSIS OF ADHESIONS N/A 01/06/2013   Procedure: LAPAROSCOPIC LYSIS OF ADHESIONS/ INTEROTOMY REPAIR;  Surgeon: Adin Hector, MD;  Location: [facility] ORS;  Service: General;  Laterality: N/A;  . LAPAROTOMY N/A 11/27/2012   Procedure: EXPLORATORY LAPAROTOMY SIGMOID COLECTOMY, COLOSTOMY;  Surgeon: Madilyn Hook, DO;  Location: [facility];  Service: General;  Laterality: N/A;  . RADIOLOGY WITH ANESTHESIA Left 10/23/2017   Procedure: CT RENAL CRYO AND BIOPSY;  Surgeon: Aletta Edouard, MD;  Location: [facility];  Service: Radiology;  Laterality: Left;  . TOTAL KNEE ARTHROPLASTY Bilateral 6/08     Medications: No outpatient medications have been marked as taking for the 09/21/19 encounter (Appointment) with Burtis Junes, NP.     Allergies: Allergies  Allergen Reactions  .  Spinach Itching, Rash and Other (See Comments)    Welts.    Social History: The patient  reports that she quit smoking about 6 years ago. Her smoking use included cigarettes. She smoked 1.00 pack per day. She has never used smokeless tobacco. She reports that she does not drink alcohol or use drugs.   Family History: The patient's ***family history is not on file.   Review of Systems: Please see the history of present illness.   All other systems are reviewed and negative.   Physical Exam: VS:  LMP 06/25/2006 (LMP Unknown)  .  BMI There is no height or weight on file to calculate BMI.  Wt Readings from Last 3 Encounters:  03/04/19 150 lb (68 kg)  02/13/18 150 lb (68 kg)  10/22/17 105 lb (47.6 kg)    General: Pleasant. Well developed, well nourished and in no acute distress.   HEENT: Normal.  Neck: Supple, no JVD, carotid bruits, or masses noted.  Cardiac: ***Regular rate and rhythm. No murmurs, rubs, or gallops. No edema.  Respiratory:  Lungs are clear to auscultation bilaterally with normal work of breathing.  GI: Soft and nontender.  MS: No deformity or atrophy. Gait and ROM intact.  Skin: Warm and dry. Color is normal.  Neuro:  Strength and sensation are intact and no gross focal deficits noted.  Psych: Alert, appropriate and with normal affect.   LABORATORY DATA:  EKG:  EKG {ACTION; IS/IS GI:087931 ordered today.  Personally reviewed by me. This demonstrates ***.  Lab Results  Component Value Date   WBC 8.1 10/24/2017   HGB 9.7 (L) 10/24/2017   HCT 29.9 (L) 10/24/2017   PLT 239 10/24/2017   GLUCOSE 102 (H) 04/20/2019   CHOL 142 12/02/2012   TRIG 132 01/06/2013   HDL 46 12/02/2012   LDLCALC 74 12/02/2012   ALT 14 09/10/2016   AST 18 09/10/2016   NA 142 04/20/2019   K 4.2 04/20/2019   CL 105 04/20/2019   CREATININE 0.67 04/20/2019   BUN 10 04/20/2019   CO2 30 04/20/2019   TSH 0.856 06/07/2015   INR 0.88 10/22/2017   HGBA1C 5.6 06/09/2015     BNP  (last 3 results) No results for input(s): BNP in the last 8760 hours.  ProBNP (last 3 results) No results for input(s): PROBNP in the last 8760 hours.   Other Studies Reviewed Today:  Last cardiac evaluation included an echocardiogram 03/2016: Study Conclusions  - Left ventricle: The cavity size was normal. Systolic function was mildly to moderately reduced. The estimated ejection fraction was in the range of 40% to 45%. Diffuse hypokinesis. Features are consistent with a pseudonormal left ventricular filling pattern, with concomitant abnormal relaxation and increased filling pressure (grade 2 diastolic dysfunction). - Mitral valve: There was mild regurgitation directed eccentrically and posteriorly. - Tricuspid valve: There was mild regurgitation.  Assessment/Plan: ASSESSMENT & PLAN:  1. Marfan's syndrome   2. Chronic combined systolic and diastolic heart failure ([place])   3. Typical atrial flutter ([place])   4. Essential hypertension   5. Pulmonary HTN ([place])   6. Educated About Covid-19 Virus Infection    PLAN:  1. Unable to assess as there has been no recent echo to look at aortic root size. 2. With recent dyspnea, will reassess 2D Doppler echocardiogram to rule out systolic dysfunction or some other significant change in status. 3. No clinical data to suggest atrial flutter. 4. Blood pressure obtained today is very good. 5. Echocardiogram will help assess for presence of pulmonary hypertension. 6. Social distancing is encouraged.  Mask wearing and handwashing is a must.   . COVID-19 Education: The signs and symptoms of COVID-19 were discussed with the patient and how to seek care for testing (follow up with PCP or arrange E-visit).  The importance of social distancing, staying at home, hand hygiene and wearing a mask when out in public were discussed today.  Current medicines are reviewed with the patient today.  The patient does not have concerns  regarding medicines other than what has been noted above.  The following changes have been made:  See above.  Labs/ tests ordered today include:   No orders of the defined types were placed in this encounter.    Disposition:   FU with *** in {gen number [id number] {Days to years:10300}.   Patient is agreeable to this plan and will call if any problems develop in the interim.   SignedTruitt Merle, NP  09/16/2019 7:53 AM  [facility] [address] [place], [place]  [address] Phone: [phone] Fax: [phone]

## 2019-09-21 ENCOUNTER — Ambulatory Visit: Payer: Medicare Other | Admitting: Nurse Practitioner

## 2019-09-21 ENCOUNTER — Telehealth: Payer: Self-pay | Admitting: Family Medicine

## 2019-09-21 NOTE — Progress Notes (Signed)
°  Chronic Care Management   Outreach Note  09/21/2019 Name: MARIPAT OSANTOWSKI MRN: [id number] DOB: 02/12/1956  Referred by: [name], MD Reason for referral : No chief complaint on file.   An unsuccessful telephone outreach was attempted today. The patient was referred to the pharmacist for assistance with care management and care coordination.   Follow Up Plan:   Raynicia Dukes [employer] Scheduler

## 2019-09-22 ENCOUNTER — Telehealth: Payer: Self-pay | Admitting: Family Medicine

## 2019-09-22 DIAGNOSIS — I43 Cardiomyopathy in diseases classified elsewhere: Secondary | ICD-10-CM

## 2019-09-22 DIAGNOSIS — M069 Rheumatoid arthritis, unspecified: Secondary | ICD-10-CM

## 2019-09-22 NOTE — Progress Notes (Signed)
  Chronic Care Management   Note  09/22/2019 Name: MANDRA ALDERETTE MRN: [id number] DOB: 02-16-1956  MIATTA MARAVILLA is a 64 y.o. year old female who is a primary care patient of [name], MD. I reached out to [name] by phone today in response to a referral sent by Ms. York Cerise PCP, [name], MD.   Ms. Crehan was given information about Chronic Care Management services today including:  1. CCM service includes personalized support from designated clinical staff supervised by her physician, including individualized plan of care and coordination with other care providers 2. 24/7 contact phone numbers for assistance for urgent and routine care needs. 3. Service will only be billed when office clinical staff spend 20 minutes or more in a month to coordinate care. 4. Only one practitioner may furnish and bill the service in a calendar month. 5. The patient may stop CCM services at any time (effective at the end of the month) by phone call to the office staff.   Patient agreed to services and verbal consent obtained.   Follow up plan:   Raynicia Dukes [employer] Scheduler

## 2019-09-28 NOTE — Progress Notes (Signed)
Virtual Visit via Telephone Note   This visit type was conducted due to national recommendations for restrictions regarding the COVID-19 Pandemic (e.g. social distancing) in an effort to limit this patient's exposure and mitigate transmission in our community.  Due to her co-morbid illnesses, this patient is at least at moderate risk for complications without adequate follow up.  This format is felt to be most appropriate for this patient at this time.  The patient did not have access to video technology/had technical difficulties with video requiring transitioning to audio format only (telephone).  All issues noted in this document were discussed and addressed.  No physical exam could be performed with this format.  Please refer to the patient's chart for her  consent to telehealth for [facility].   The patient was identified using 2 identifiers.  Date:  09/28/2019   ID:  [name], DOB 1956/01/22, MRN [id number]  Patient Location: Home Provider Location: Office  PCP:  [name], MD  Cardiologist:  Sinclair Grooms, MD  Electrophysiologist:  None   Evaluation Performed:  Follow-Up Visit  Chief Complaint:  Atrial flutter  History of Present Illness:    [name] is a 64 y.o. female with a history of atrial flutter, tachycardia induced LV systolic dysfunction recovered to normal, prior stroke, rheumatoid arthritis, essential hypertension, and prior history of alcohol abuse.  Question Marfan's.  The patient has a prior history of atrial flutter and underwent flutter ablation by Dr. Beckie Salts.  She has known chronic combined systolic and diastolic heart failure.  In speaking with her today by telephone virtual encounter, she complains of intermittent shortness of breath, occasional orthopnea, but no lower extremity swelling.  She assumes that the shortness of breath is related to an abdominal hernia at the site of her colostomy.  She has not had chest pain, racing  heart, syncope.  She ambulates with a walker.  She has multiple other medical problems including rheumatoid arthritis and possibly Marfan's.  She no longer smokes.  She discontinued alcohol use years ago.  Currently abstinent from both.  Marfan's syndrome  Echo was done last year and looked good EF 60-65%, no LVH, impaired LV relaxation,   HTN controlled   Aortic root normal.  2.80 cm Today she has no chest pain and only SOB with hard exertion.  She does not exercise due to her hernia and colostomy.  She notes they want to fix the hernia but she is hesitant due to [place] in the hospital.   Reassured the numbers are low and they have most COVID pts separate from others.      The patient does not have symptoms concerning for COVID-19 infection (fever, chills, cough, or new shortness of breath).    Past Medical History:  Diagnosis Date  . Atrial flutter ([place])   . Chronic gout 2008  . Diverticulitis of large intestine with perforation   . Dysrhythmia   . Fibroid   . Hearing aid worn    pt wears bilateral hearing aids  . Hernia 4/08  . Hx of adenomatous polyp of colon 11/13/2016  . Hypertension   . Legally blind    since pt was a teenager  . Marfan's syndrome affecting skin    with scolosis  . Osteoporosis   . 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 Rheumatoid arthritis [facility])    sees Dr. Gavin Pound   . SBO (small bowel obstruction) ([place]) 01/05/2013  . Small bowel  obstruction due to adhesions ([place])   . Status post bunionectomy 1/10   bilateral   . Stroke ([place]) 2/08  . Substance abuse ([place])    recovering alcoholic [id number] years   . 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 ABLATION  01/04/14   atrial flutter ablation (2 circuits) by Dr Lovena Le  . ATRIAL FLUTTER ABLATION N/A 01/04/2014   Procedure: ATRIAL FLUTTER ABLATION;  Surgeon: Evans Lance, MD;  Location: [facility] CATH LAB;  Service: Cardiovascular;  Laterality: N/A;    . BUNIONECTOMY Bilateral 1/10  . COLON SURGERY     colectomy  . COLOSTOMY  11/27/2012  . HERNIA REPAIR    . IR RADIOLOGIST EVAL & MGMT  10/01/2017  . IR RADIOLOGIST EVAL & MGMT  11/21/2017  . IR RADIOLOGIST EVAL & MGMT  02/13/2018  . IR RADIOLOGIST EVAL & MGMT  01/08/2019  . LAPAROSCOPIC ABDOMINAL EXPLORATION N/A 01/06/2013   Procedure: LAPAROSCOPIC ABDOMINAL EXPLORATION;  Surgeon: Adin Hector, MD;  [facility];  Service: General;  Laterality: N/A;  . LAPAROSCOPIC LYSIS OF ADHESIONS N/A 01/06/2013   Procedure: LAPAROSCOPIC LYSIS OF ADHESIONS/ INTEROTOMY REPAIR;  Surgeon: Adin Hector, MD;  Location: [facility];  Service: General;  Laterality: N/A;  . LAPAROTOMY N/A 11/27/2012   Procedure: EXPLORATORY LAPAROTOMY SIGMOID COLECTOMY, COLOSTOMY;  Surgeon: Madilyn Hook, DO;  [facility];  Service: General;  Laterality: N/A;  . RADIOLOGY WITH ANESTHESIA Left 10/23/2017   Procedure: CT RENAL CRYO AND BIOPSY;  Surgeon: Aletta Edouard, MD;  Location[facility];  Service: Radiology;  Laterality: Left;  . TOTAL KNEE ARTHROPLASTY Bilateral 6/08     No outpatient medications have been marked as taking for the 09/29/19 encounter (Appointment) with Isaiah Serge, NP.     Allergies:   Spinach   Social History   Tobacco Use  . Smoking status: Former Smoker    Packs/day: 1.00    Types: Cigarettes    Quit date: 11/27/2012    Years since quitting: 6.8  . Smokeless tobacco: Never Used  Substance Use Topics  . Alcohol use: No    Alcohol/week: 0.0 standard drinks    Comment: recovering alcoholic sober since 0000000  . Drug use: No     Family Hx: The patient's family history is negative for Heart attack and Colon cancer.  ROS:   Please see the history of present illness.    General:no colds or fevers, no weight changes Skin:no rashes or ulcers HEENT:no blurred vision, no congestion CV:see HPI PUL:see HPI GI:no diarrhea constipation or melena, no indigestion GU:no hematuria, no dysuria MS:no  joint pain, no claudication Neuro:no syncope, no lightheadedness Endo:no diabetes, no thyroid disease' All other systems reviewed and are negative.   Prior CV studies:   The following studies were reviewed today: ECho 03/30/19  IMPRESSIONS    1. Left ventricular ejection fraction, by visual estimation, is 60 to  65%. The left ventricle has normal function. Normal left ventricular size.  There is no left ventricular hypertrophy.  2. Left ventricular diastolic Doppler parameters are consistent with  impaired relaxation pattern of LV diastolic filling.  3. Global right ventricle has normal systolic function.The right  ventricular size is normal. No increase in right ventricular wall  thickness.  4. Left atrial size was normal.  5. Right atrial size was normal.  6. The mitral valve is normal in structure. Mild mitral valve  regurgitation. No evidence of mitral  stenosis.  7. The tricuspid valve is normal in structure. Tricuspid valve  regurgitation moderate.  8. The aortic valve is normal in structure. Aortic valve regurgitation  was not visualized by color flow Doppler. Structurally normal aortic  valve, with no evidence of sclerosis or stenosis.  9. The pulmonic valve was normal in structure. Pulmonic valve  regurgitation is not visualized by color flow Doppler.  10. Mildly elevated pulmonary artery systolic pressure.  11. The inferior vena cava is normal in size with greater than 50%  respiratory variability, suggesting right atrial pressure of 3 mmHg.   FINDINGS  Left Ventricle: Left ventricular ejection fraction, by visual estimation,  is 60 to 65%. The left ventricle has normal function. There is no left  ventricular hypertrophy. Normal left ventricular size. Spectral Doppler  shows Left ventricular diastolic  Doppler parameters are consistent with impaired relaxation pattern of LV  diastolic filling.   Right Ventricle: The right ventricular size is normal. No  increase in  right ventricular wall thickness. Global RV systolic function is has  normal systolic function. The tricuspid regurgitant velocity is 2.35 m/s,  and with an assumed right atrial pressure  of 10 mmHg, the estimated right ventricular systolic pressure is mildly  elevated at 32.1 mmHg.   Left Atrium: Left atrial size was normal in size.   Right Atrium: Right atrial size was normal in size   Pericardium: There is no evidence of pericardial effusion.   Mitral Valve: The mitral valve is normal in structure. No evidence of  mitral valve stenosis by observation. Mild mitral valve regurgitation.   Tricuspid Valve: The tricuspid valve is normal in structure. Tricuspid  valve regurgitation moderate by color flow Doppler.   Aortic Valve: The aortic valve is normal in structure. Aortic valve  regurgitation was not visualized by color flow Doppler. The aortic valve  is structurally normal, with no evidence of sclerosis or stenosis.   Pulmonic Valve: The pulmonic valve was normal in structure. Pulmonic valve  regurgitation is not visualized by color flow Doppler.   Aorta: The aortic root, ascending aorta and aortic arch are all  structurally normal, with no evidence of dilitation or obstruction.   Venous: The inferior vena cava is normal in size with greater than 50%  respiratory variability, suggesting right atrial pressure of 3 mmHg.   IAS/Shunts: No atrial level shunt detected by color flow Doppler. No  ventricular septal defect is seen or detected. There is no evidence of an  atrial septal defect.    Labs/Other Tests and Data Reviewed:    EKG:  No ECG reviewed.  Recent Labs: 04/20/2019: BUN 10; Creatinine, Ser 0.67; Potassium 4.2; Sodium 142   Recent Lipid Panel Lab Results  Component Value Date/Time   CHOL 142 12/02/2012 03:56 AM   TRIG 132 01/06/2013 05:12 AM   HDL 46 12/02/2012 03:56 AM   CHOLHDL 3.1 12/02/2012 03:56 AM   LDLCALC 74 12/02/2012 03:56 AM     Wt Readings from Last 3 Encounters:  03/04/19 150 lb (68 kg)  02/13/18 150 lb (68 kg)  10/22/17 105 lb (47.6 kg)     Objective:    Vital Signs:  LMP 06/25/2006 (LMP Unknown)    VITAL SIGNS:  reviewed General A&O X 3 NAD Neuro see above Pulmonary; can speak in complete sentences without SOB  ASSESSMENT & PLAN:    1. Marfan's syndrome Echo stable  2. chronic combined systolic and diastolic HF euvolemic today  3. Atrial flutter no awareness of irregular  HR 4. HTN controlled   COVID-19 Education: The signs and symptoms of COVID-19 were discussed with the patient and how to seek care for testing (follow up with PCP or arrange E-visit).  The importance of social distancing was discussed today.  Time:   Today, I have spent 6 minutes with the patient with telehealth technology discussing the above problems.     Medication Adjustments/Labs and Tests Ordered: Current medicines are reviewed at length with the patient today.  Concerns regarding medicines are outlined above.   Tests Ordered: No orders of the defined types were placed in this encounter.   Medication Changes: No orders of the defined types were placed in this encounter.   Follow Up:  In Person in 6 month(s)  Signed, Cecilie Kicks, NP  09/28/2019 10:19 PM    [facility]

## 2019-09-29 ENCOUNTER — Telehealth (INDEPENDENT_AMBULATORY_CARE_PROVIDER_SITE_OTHER): Payer: Medicare Other | Admitting: Cardiology

## 2019-09-29 ENCOUNTER — Other Ambulatory Visit: Payer: Self-pay

## 2019-09-29 ENCOUNTER — Encounter: Payer: Self-pay | Admitting: Cardiology

## 2019-09-29 ENCOUNTER — Telehealth: Payer: Self-pay | Admitting: *Deleted

## 2019-09-29 VITALS — BP 123/89 | HR 75 | Ht 68.0 in | Wt 150.0 lb

## 2019-09-29 DIAGNOSIS — Q8740 Marfan's syndrome, unspecified: Secondary | ICD-10-CM

## 2019-09-29 DIAGNOSIS — I110 Hypertensive heart disease with heart failure: Secondary | ICD-10-CM | POA: Diagnosis not present

## 2019-09-29 DIAGNOSIS — I4892 Unspecified atrial flutter: Secondary | ICD-10-CM | POA: Diagnosis not present

## 2019-09-29 DIAGNOSIS — I10 Essential (primary) hypertension: Secondary | ICD-10-CM

## 2019-09-29 DIAGNOSIS — I5042 Chronic combined systolic (congestive) and diastolic (congestive) heart failure: Secondary | ICD-10-CM | POA: Diagnosis not present

## 2019-09-29 DIAGNOSIS — I483 Typical atrial flutter: Secondary | ICD-10-CM

## 2019-09-29 NOTE — Telephone Encounter (Signed)
  Patient Consent for Virtual Visit         [name] has provided verbal consent on 09/29/2019 for a virtual visit (video or telephone).   CONSENT FOR VIRTUAL VISIT FOR:  [name]  By participating in this virtual visit I agree to the following:  I hereby voluntarily request, consent and authorize [facility] and its employed or contracted physicians, physician assistants, nurse practitioners or other licensed health care professionals (the Practitioner), to provide me with telemedicine health care services (the "Services") as deemed necessary by the treating Practitioner. I acknowledge and consent to receive the Services by the Practitioner via telemedicine. I understand that the telemedicine visit will involve communicating with the Practitioner through live audiovisual communication technology and the disclosure of certain medical information by electronic transmission. I acknowledge that I have been given the opportunity to request an in-person assessment or other available alternative prior to the telemedicine visit and am voluntarily participating in the telemedicine visit.  I understand that I have the right to withhold or withdraw my consent to the use of telemedicine in the course of my care at any time, without affecting my right to future care or treatment, and that the Practitioner or I may terminate the telemedicine visit at any time. I understand that I have the right to inspect all information obtained and/or recorded in the course of the telemedicine visit and may receive copies of available information for a reasonable fee.  I understand that some of the potential risks of receiving the Services via telemedicine include:  Marland Kitchen Delay or interruption in medical evaluation due to technological equipment failure or disruption; . Information transmitted may not be sufficient (e.g. poor resolution of images) to allow for appropriate medical decision making by the Practitioner;  and/or  . In rare instances, security protocols could fail, causing a breach of personal health information.  Furthermore, I acknowledge that it is my responsibility to provide information about my medical history, conditions and care that is complete and accurate to the best of my ability. I acknowledge that Practitioner's advice, recommendations, and/or decision may be based on factors not within their control, such as incomplete or inaccurate data provided by me or distortions of diagnostic images or specimens that may result from electronic transmissions. I understand that the practice of medicine is not an exact science and that Practitioner makes no warranties or guarantees regarding treatment outcomes. I acknowledge that a copy of this consent can be made available to me via my patient portal ([facility]), or I can request a printed copy by calling the office of [facility].    I understand that my insurance will be billed for this visit.   I have read or had this consent read to me. . I understand the contents of this consent, which adequately explains the benefits and risks of the Services being provided via telemedicine.  . I have been provided ample opportunity to ask questions regarding this consent and the Services and have had my questions answered to my satisfaction. . I give my informed consent for the services to be provided through the use of telemedicine in my medical care

## 2019-09-29 NOTE — Patient Instructions (Signed)
Medication Instructions:  Your physician recommends that you continue on your current medications as directed. Please refer to the Current Medication list given to you today.  *If you need a refill on your cardiac medications before your next appointment, please call your pharmacy*   Lab Work: None ordered  If you have labs (blood work) drawn today and your tests are completely normal, you will receive your results only by: . MyChart Message (if you have MyChart) OR . A paper copy in the mail If you have any lab test that is abnormal or we need to change your treatment, we will call you to review the results.   Testing/Procedures: None ordered   Follow-Up: At [facility], you and your health needs are our priority.  As part of our continuing mission to provide you with exceptional heart care, we have created designated Provider Care Teams.  These Care Teams include your primary Cardiologist (physician) and Advanced Practice Providers (APPs -  Physician Assistants and Nurse Practitioners) who all work together to provide you with the care you need, when you need it.  We recommend signing up for the patient portal called "MyChart".  Sign up information is provided on this After Visit Summary.  MyChart is used to connect with patients for Virtual Visits (Telemedicine).  Patients are able to view lab/test results, encounter notes, upcoming appointments, etc.  Non-urgent messages can be sent to your provider as well.   To learn more about what you can do with MyChart, go to [email or web address]://[email or web address].com.    Your next appointment:   6 month(s)  The format for your next appointment:   In Person  Provider:   Henry Smith, MD   Other Instructions  

## 2019-10-05 ENCOUNTER — Encounter: Payer: Self-pay | Admitting: Family Medicine

## 2019-10-05 DIAGNOSIS — Z1231 Encounter for screening mammogram for malignant neoplasm of breast: Secondary | ICD-10-CM | POA: Diagnosis not present

## 2019-10-05 DIAGNOSIS — M810 Age-related osteoporosis without current pathological fracture: Secondary | ICD-10-CM | POA: Diagnosis not present

## 2019-10-05 DIAGNOSIS — M85851 Other specified disorders of bone density and structure, right thigh: Secondary | ICD-10-CM | POA: Diagnosis not present

## 2019-10-09 NOTE — Addendum Note (Signed)
Addended by: [name] A on: 10/09/2019 07:51 AM   Modules accepted: Orders

## 2019-10-13 ENCOUNTER — Telehealth: Payer: Self-pay | Admitting: Family Medicine

## 2019-10-13 DIAGNOSIS — Z933 Colostomy status: Secondary | ICD-10-CM | POA: Diagnosis not present

## 2019-10-13 NOTE — Progress Notes (Signed)
Attempting to contact patient to reschedule CCM appt that was canceled for Thursday, April 22nd due to CPP out of office for meeting.    [name] [employer] Scheduler

## 2019-10-14 DIAGNOSIS — R5381 Other malaise: Secondary | ICD-10-CM | POA: Diagnosis not present

## 2019-10-15 ENCOUNTER — Telehealth: Payer: Self-pay | Admitting: Family Medicine

## 2019-10-15 ENCOUNTER — Telehealth: Payer: Medicare Other

## 2019-10-15 MED ORDER — ALLOPURINOL 100 MG PO TABS: ORAL_TABLET | ORAL | 0 refills | Status: DC

## 2019-10-15 MED ORDER — AMLODIPINE BESYLATE 10 MG PO TABS: ORAL_TABLET | ORAL | 0 refills | Status: DC

## 2019-10-15 NOTE — Telephone Encounter (Signed)
Medication:Allopurinol & Amlodipine Pharmacy: [facility]

## 2019-10-15 NOTE — Telephone Encounter (Signed)
Left a detailed message on verified voice mail.  Rx sent in

## 2019-10-19 ENCOUNTER — Telehealth (INDEPENDENT_AMBULATORY_CARE_PROVIDER_SITE_OTHER): Payer: Medicare Other | Admitting: Family Medicine

## 2019-10-19 ENCOUNTER — Encounter: Payer: Self-pay | Admitting: Family Medicine

## 2019-10-19 ENCOUNTER — Other Ambulatory Visit: Payer: Self-pay

## 2019-10-19 VITALS — Temp 98.5°F | Ht 68.0 in | Wt 145.0 lb

## 2019-10-19 DIAGNOSIS — F1190 Opioid use, unspecified, uncomplicated: Secondary | ICD-10-CM | POA: Diagnosis not present

## 2019-10-19 DIAGNOSIS — M069 Rheumatoid arthritis, unspecified: Secondary | ICD-10-CM

## 2019-10-19 MED ORDER — HYDROCODONE-ACETAMINOPHEN 10-325 MG PO TABS: 1.0000 | ORAL_TABLET | Freq: Four times a day (QID) | ORAL | 0 refills | Status: DC | PRN

## 2019-10-19 MED ORDER — ALBUTEROL SULFATE HFA 108 (90 BASE) MCG/ACT IN AERS: 1.0000 | INHALATION_SPRAY | RESPIRATORY_TRACT | 11 refills | Status: DC | PRN

## 2019-10-19 NOTE — Progress Notes (Signed)
   Subjective:    Patient ID: [name], female    DOB: 28-Feb-1956, 64 y.o.   MRN: [id number]  HPI Virtual Visit via Telephone Note  I connected with the patient on 10/19/19 at  2:00 PM EDT by telephone and verified that I am speaking with the correct person using two identifiers.   I discussed the limitations, risks, security and privacy concerns of performing an evaluation and management service by telephone and the availability of in person appointments. I also discussed with the patient that there may be a patient responsible charge related to this service. The patient expressed understanding and agreed to proceed.  Location patient: home Location provider: work or home office Participants present for the call: patient, provider Patient did not have a visit in the prior 7 days to address this/these issue(s).   History of Present Illness: Here for pain management, she is doing well.  Indication for chronic opioid: RA Medication and dose: Norco 10-325 # pills per month: 120 Last UDS date: 07-21-18 Opioid Treatment Agreement signed (Y/N): 07-12-17 Opioid Treatment Agreement last reviewed with patient:  10-19-19 N[facility] reviewed this encounter (include red flags): Yes    Observations/Objective: Patient sounds cheerful and well on the phone. I do not appreciate any SOB. Speech and thought processing are grossly intact. Patient reported vitals:  Assessment and Plan: Pain management, meds were refilled.  [name], MD   Follow Up Instructions:     [phone] 5-10 [phone] 11-20 [id number] 21-30 I did not refer this patient for an OV in the next 24 hours for this/these issue(s).  I discussed the assessment and treatment plan with the patient. The patient was provided an opportunity to ask questions and all were answered. The patient agreed with the plan and demonstrated an understanding of the instructions.   The patient was advised to call back or seek an in-person evaluation if  the symptoms worsen or if the condition fails to improve as anticipated.  I provided 13  minutes of non-face-to-face time during this encounter.   [name], MD    Review of Systems     Objective:   Physical Exam        Assessment & Plan:

## 2019-10-20 DIAGNOSIS — R921 Mammographic calcification found on diagnostic imaging of breast: Secondary | ICD-10-CM | POA: Diagnosis not present

## 2019-10-20 LAB — HM MAMMOGRAPHY

## 2019-10-22 ENCOUNTER — Encounter: Payer: Self-pay | Admitting: Family Medicine

## 2019-11-02 ENCOUNTER — Telehealth: Payer: Self-pay | Admitting: Family Medicine

## 2019-11-02 DIAGNOSIS — M810 Age-related osteoporosis without current pathological fracture: Secondary | ICD-10-CM

## 2019-11-02 NOTE — Telephone Encounter (Signed)
Pt call and want call a back about her Bone Density result, .

## 2019-11-03 NOTE — Telephone Encounter (Signed)
I haven't gotten the results yet

## 2019-11-04 NOTE — Telephone Encounter (Signed)
Pt called back to let Ria Comment know that she spoke to the office she had her bone density test and that they will be faxing over her results right after their call

## 2019-11-04 NOTE — Telephone Encounter (Signed)
Her DXA from [facility] on 10-05-19 showed osteoporosis (the worst T score was -4.20). I have referred her to Endocrine to manage this

## 2019-11-04 NOTE — Telephone Encounter (Signed)
Noted. Will keep an eye out for the fax  

## 2019-11-04 NOTE — Telephone Encounter (Signed)
Noted. Will call patient once results come in. Left a detailed message on verified voice mail making Pt aware.

## 2019-11-05 NOTE — Telephone Encounter (Signed)
Left message for patient to call back  

## 2019-11-05 NOTE — Telephone Encounter (Signed)
Spoke with the patient. She is aware of the results.

## 2019-11-09 ENCOUNTER — Ambulatory Visit: Payer: Medicare Other

## 2019-11-09 ENCOUNTER — Other Ambulatory Visit: Payer: Self-pay

## 2019-11-09 DIAGNOSIS — I639 Cerebral infarction, unspecified: Secondary | ICD-10-CM

## 2019-11-09 DIAGNOSIS — E785 Hyperlipidemia, unspecified: Secondary | ICD-10-CM

## 2019-11-09 DIAGNOSIS — K219 Gastro-esophageal reflux disease without esophagitis: Secondary | ICD-10-CM

## 2019-11-09 DIAGNOSIS — I10 Essential (primary) hypertension: Secondary | ICD-10-CM

## 2019-11-09 NOTE — Chronic Care Management (AMB) (Signed)
Chronic Care Management Pharmacy  Name: [name]  MRN: [id number] DOB: [birth year]  Initial Questions: 1. Have you seen any other providers since your last visit? NA 2. Any changes in your medicines or health? No   Chief Complaint/ HPI  [name],  64 y.o. , female presents for their Initial CCM visit with the clinical pharmacist via telephone due to COVID-19 Pandemic.  PCP : [name], MD  Their chronic conditions include: HTN, HLD, Hx of TIA, Gout, GERD, RA, Pain, Osteoporosis  Office Visits: 10/19/2019- [name], MD- patient for office visit for pain management for rheumatoid arthritis. No SOB noted. Norco 10/325mg  was refilled.   07/20/2019- [name], MD- patient for office visit for pain management for rheumatoid arthritis. No SOB noted.   04/20/2019-  [name], MD- patient for office visit for pain management. Norco 10/325mg  was refilled.   Consult Visit: 10/19/2019- Cardiology- Cecilie Kicks, RN- patient presented for virtual visit for atrial flutter. Suspicion of Marfan's syndrome. ECHO was stable and patient was euvolemic. No awareness of irregular HR and HTN was controlled. Patient to follow up in person in 6 months.   03/04/2019- Cardiology- Daneen Schick, MD- Patient presented for follow up. Patient to obtain ECHO to rule out systolic dysfunction or other change in status.   Medications: Outpatient Encounter Medications as of 11/09/2019  Medication Sig  . Abatacept (ORENCIA) 125 MG/ML SOSY Inject 125 mg into the skin every Monday.   Marland Kitchen albuterol (VENTOLIN HFA) 108 (90 Base) MCG/ACT inhaler Inhale 1 puff into the lungs every 4 (four) hours as needed for wheezing or shortness of breath.  . allopurinol (ZYLOPRIM) 100 MG tablet TAKE 1 TABLET(100 MG) BY MOUTH DAILY  . amLODipine (NORVASC) 10 MG tablet TAKE 1 TABLET(10 MG) BY MOUTH DAILY  . aspirin EC 81 MG tablet Take 1 tablet (81 mg total) by mouth daily.  Marland Kitchen CALCIUM PO Take 1 tablet by mouth 2 (two)  times daily.  . cetirizine (ZYRTEC) 10 MG tablet Take 1 tablet (10 mg total) by mouth daily.  . chlorhexidine (PERIDEX) 0.12 % solution Use as directed 10 mLs in the mouth or throat daily.   . Cholecalciferol (VITAMIN D) 2000 units CAPS Take 2,000 Units by mouth daily.  Marland Kitchen CIPRODEX OTIC suspension INSTILL 4 DROPS UNTO EACH EAR TWICE DAILY AS NEEDED  . docusate sodium (COLACE) 100 MG capsule Take 100 mg by mouth 2 (two) times daily.  Marland Kitchen esomeprazole (NEXIUM) 40 MG capsule TAKE 1 CAPSULE(40 MG) BY MOUTH DAILY  . folic acid (FOLVITE) A999333 MCG tablet Take 400 mcg by mouth daily.  Derrill Memo ON 12/19/2019] HYDROcodone-acetaminophen (NORCO) 10-325 MG tablet Take 1 tablet by mouth every 6 (six) hours as needed for moderate pain.  Marland Kitchen ketoconazole (NIZORAL) 2 % cream Apply 1 application topically daily as needed for irritation.  . megestrol (MEGACE ES) 625 MG/5ML suspension take 5 milliliters by mouth three times daily before meals as needed for appetite  . Methotrexate Sodium (METHOTREXATE, PF,) 50 MG/2ML injection Inject 0.5 mLs into the muscle every Monday.  . metoCLOPramide (REGLAN) 10 MG tablet Take 1 tablet (10 mg total) by mouth every 6 (six) hours. (Patient taking differently: Take 10 mg by mouth every 6 (six) hours as needed. )  . Multiple Vitamin (MULTIVITAMIN WITH MINERALS) TABS tablet Take 1 tablet by mouth daily.  . polyethylene glycol (MIRALAX / GLYCOLAX) 17 g packet Take 17 g by mouth daily as needed for mild constipation.  . traMADol (ULTRAM) 50  MG tablet TAKE 1-2 TABLETS BY MOUTH EVERY 6 HOURS AS NEEDED  . triamcinolone (KENALOG) 0.025 % cream Apply 1 application topically daily as needed (itching).  . vitamin B-12 (CYANOCOBALAMIN) 500 MCG tablet Take 500 mcg by mouth daily.  . vitamin C (ASCORBIC ACID) 500 MG tablet Take 500 mg by mouth daily.   . [DISCONTINUED] tiZANidine (ZANAFLEX) 4 MG tablet TAKE 1 TABLET BY MOUTH TWICE DAILY  . diclofenac sodium (VOLTAREN) 1 % GEL Apply 1 application  topically 4 (four) times daily as needed (arthritis).   No facility-administered encounter medications on file as of 11/09/2019.     Current Diagnosis/Assessment:  Goals Addressed            This Visit's Progress   . Pharmacy Care Plan       CARE PLAN ENTRY  Current Barriers:  . Chronic Disease Management support, education, and care coordination needs related to Hypertension, Hyperlipidemia, GERD, and History of stroke   Hypertension . Pharmacist Clinical Goal(s): o Over the next 90 days, patient will work with PharmD and providers to maintain BP goal <130/80 . Current regimen:  o Amlodipine 10mg ,1 tablet once daily . Patient self care activities - Over the next 90 days, patient will: o Check BP as directed, document, and provide at future appointments o Ensure daily salt intake < 2300 mg/day  Hyperlipidemia . Pharmacist Clinical Goal(s): o Over the next 90 days, patient will work with PharmD and providers to maintain LDL goal < 100 . Current regimen:  o No medications . Interventions: o Recommend cholesterol blood work at next physical with Dr. Sarajane Jews.  . Patient self care activities - Over the next 90 days, patient will: o Schedule physical with Dr. Sarajane Jews.   GERD . Pharmacist Clinical Goal(s) o Over the next 90 days, patient will work with PharmD and providers to minimize acid reflux symptoms.  . Current regimen:   Esomeprazole 40mg , 1 capsule once daily  . Interventions: o Discussed non-pharmacological interventions for acid reflux. Take measures to prevent acid reflux, such as avoiding spicy foods, avoiding caffeine, avoid laying down a few hours after eating, and raising the head of the bed . Patient self care activities - Over the next 90 days, patient will: o Avoid trigger foods.   History of stroke . Pharmacist Clinical Goal(s) o Over the next 90 days, patient will work with PharmD and providers to take aspirin to prevent stroke. . Current regimen:  o Aspirin  81mg , 1 tablet once daily . Patient self care activities o Patient will continue current medications.   Medication management . Pharmacist Clinical Goal(s): o Over the next 90 days, patient will work with PharmD and providers to maintain optimal medication adherence . Current pharmacy: [facility] . Interventions o Comprehensive medication review performed. o Continue current medication management strategy . Patient self care activities - Over the next 90 days, patient will: o Take medications as prescribed o Report any questions or concerns to PharmD and/or provider(s)  Initial goal documentation       SDOH Interventions     Most Recent Value  SDOH Interventions  Financial Strain Interventions  Intervention Not Indicated  Transportation Interventions  Intervention Not Indicated        Hypertension  Denies dizziness/ lightheadedness.   Office blood pressures are  BP Readings from Last 3 Encounters:  09/29/19 123/89  04/20/19 112/72  03/04/19 117/77   Patient has failed these meds in the past: metoprolol (patient preference)   Patient checks BP at  home when feeling symptomatic  Patient home BP readings are ranging: 115/79 mmHg   Patient is controlled on:   Amlodipine 10mg ,1 tablet once daily   Plan Continue current medications   Hyperlipidemia   Lipid Panel     Component Value Date/Time   CHOL 142 12/02/2012 0356   TRIG 132 01/06/2013 0512   HDL 46 12/02/2012 0356   CHOLHDL 3.1 12/02/2012 0356   VLDL 22 12/02/2012 0356   LDLCALC 74 12/02/2012 0356     The ASCVD Risk score (Goff DC Jr., et al., 2013) failed to calculate for the following reasons:   The patient has a prior MI or stroke diagnosis   Patient has failed these meds in past: simvastatin (myalgias)   Patient is currently on the following medications:  - no medications   Plan Recommend lipid panel for further assessment.    Hx of TIA  Patient is currently controlled on the following  medications:   Aspirin 81mg , 1 tablet once daily  Plan Continue current medications  Gout   Patient does not remember last flare up.   Patient is currently controlled on the following medications:   Allopurinol 100mg , 1 tablet once daily   Plan Continue current medications  GERD  Last time of symptom was last week.  Patient reports trigger foods exacerbating symptoms.   Patient is currently controlled on the following medications:  Esomeprazole 40mg , 1 capsule once daily   Discussed non-pharmacological interventions for acid reflux. Take measures to prevent acid reflux, such as avoiding spicy foods, avoiding caffeine, avoid laying down a few hours after eating, and raising the head of the bed  Plan Continue current medications  RA  Patient reported doing okay.   Patient is currently controlled on the following medications:   Methotrexate injection, inject 0.5MLs into muscle every Monday   Folic acid 0000000, 1 tablet once daily  Orencia 125mg , inject 125mg  every Monday    Plan Managed by rheumatologist.  Continue current medications   Pain   Patient is currently controlled on the following medications:   Hydrocodone/ APAP 10/325mg , 1 tablet every six hours as needed for moderate pain (will take if tramadol doesn't work)   Diclofenac 1% gel, apply topically four times daily as needed  Tramadol 50mg , 1 to 2 tablets every six hours as needed  (takes at least 2x/ day)  Tizanidine 4mg , 1 tablet twice daily    We discussed:  -- denies CNS depression.   Plan Continue current medications  Osteoporosis   Last DEXA Scan: 10/05/2019  T-Score femoral neck: R: -1.9; L: -2.9  T-Score distal radius: -4.2  Vit D, 25-Hydroxy  Date Value Ref Range Status  10/08/2012 63 30 - 89 ng/mL Final    Comment:    This assay accurately quantifies Vitamin D, which is the sum of the 25-Hydroxy forms of Vitamin D2 and D3.  Studies have shown that the optimum concentration  of 25-Hydroxy Vitamin D is 30 ng/mL or higher.  Concentrations of Vitamin D between 20 and 29 ng/mL are considered to be insufficient and concentrations less than 20 ng/mL are considered to be deficient for Vitamin D.     Patient is currently on the following medications:   Cholecalciferol (vitamin D) 2000 units, 1 capsule once daily  Calcium, 1 tablet twice daily  Plan Referral to endocrine. Was told someone will be calling her. Continue current medications  Bronchitis  Bronchitis per patient report. Patient reports using less often now. Currently using once a day (  at most).   Patient is currently controlled on the following medications:   Albuterol HFA, 1 puff every 4 hours as needed for wheezing or shortness of breath   Plan Continue current medications  OTC/ supplements   Cetirizine 10mg , 1 tablet once daily  Docusate 100mg , 1 capsule twice daily   keoconazole 2% cream, apply once daily for irritation  Metoclopramide 10mg ,1 tablet every six hours   Multivitamin tablets, 1 tablet once daily  Miralax, 17 g daily as needed for constipation ( not everyday bc does not have someone to help change bag)   Triamcinolone 00025% cream, apply daily as needed for itching  Vitamin B12 559mcg, 1 tablet once daily  Vitamin C 500mg , 1 tablet once daily     Medication Management  Patient organizes medications: daughter helps fill pill box every week.    Primary pharmacy: [facility] Adherence: No gaps in refill history (per medication dispense report from 05/13/2019 to 11/09/2019)     Follow up Follow up visit with PharmD in 6 months.   Anson Crofts, PharmD Clinical Pharmacist [facility] [place] [phone]

## 2019-11-10 DIAGNOSIS — Z933 Colostomy status: Secondary | ICD-10-CM | POA: Diagnosis not present

## 2019-11-12 DIAGNOSIS — R5381 Other malaise: Secondary | ICD-10-CM | POA: Diagnosis not present

## 2019-11-16 ENCOUNTER — Telehealth: Payer: Self-pay | Admitting: Family Medicine

## 2019-11-16 DIAGNOSIS — M0579 Rheumatoid arthritis with rheumatoid factor of multiple sites without organ or systems involvement: Secondary | ICD-10-CM | POA: Diagnosis not present

## 2019-11-16 NOTE — Telephone Encounter (Signed)
Medication Refill:  Tizanidine   Pharmacy: [place]: [phone]

## 2019-11-17 MED ORDER — TIZANIDINE HCL 4 MG PO TABS: 4.0000 mg | ORAL_TABLET | Freq: Two times a day (BID) | ORAL | 5 refills | Status: DC

## 2019-11-17 NOTE — Telephone Encounter (Signed)
Rx sent in. Left a detailed message on verified voice mail.   

## 2019-11-17 NOTE — Addendum Note (Signed)
Addended by: Rebecca Eaton on: 11/17/2019 10:04 AM   Modules accepted: Orders

## 2019-11-21 NOTE — Patient Instructions (Addendum)
Visit Information  Goals Addressed            This Visit's Progress   . Pharmacy Care Plan       CARE PLAN ENTRY  Current Barriers:  . Chronic Disease Management support, education, and care coordination needs related to Hypertension, Hyperlipidemia, GERD, and History of stroke   Hypertension . Pharmacist Clinical Goal(s): o Over the next 90 days, patient will work with PharmD and providers to maintain BP goal <130/80 . Current regimen:  o Amlodipine 10mg ,1 tablet once daily . Patient self care activities - Over the next 90 days, patient will: o Check BP as directed, document, and provide at future appointments o Ensure daily salt intake < 2300 mg/day  Hyperlipidemia . Pharmacist Clinical Goal(s): o Over the next 90 days, patient will work with PharmD and providers to maintain LDL goal < 100 . Current regimen:  o No medications . Interventions: o Recommend cholesterol blood work at next physical with Dr. Sarajane Jews.  . Patient self care activities - Over the next 90 days, patient will: o Schedule physical with Dr. Sarajane Jews.   GERD . Pharmacist Clinical Goal(s) o Over the next 90 days, patient will work with PharmD and providers to minimize acid reflux symptoms.  . Current regimen:   Esomeprazole 40mg , 1 capsule once daily  . Interventions: o Discussed non-pharmacological interventions for acid reflux. Take measures to prevent acid reflux, such as avoiding spicy foods, avoiding caffeine, avoid laying down a few hours after eating, and raising the head of the bed . Patient self care activities - Over the next 90 days, patient will: o Avoid trigger foods.   History of stroke . Pharmacist Clinical Goal(s) o Over the next 90 days, patient will work with PharmD and providers to take aspirin to prevent stroke. . Current regimen:  o Aspirin 81mg , 1 tablet once daily . Patient self care activities o Patient will continue current medications.   Medication management . Pharmacist  Clinical Goal(s): o Over the next 90 days, patient will work with PharmD and providers to maintain optimal medication adherence . Current pharmacy: [facility] . Interventions o Comprehensive medication review performed. o Continue current medication management strategy . Patient self care activities - Over the next 90 days, patient will: o Take medications as prescribed o Report any questions or concerns to PharmD and/or provider(s)  Initial goal documentation        Ms. Grinde was given information about Chronic Care Management services today including:  1. CCM service includes personalized support from designated clinical staff supervised by her physician, including individualized plan of care and coordination with other care providers 2. 24/7 contact phone numbers for assistance for urgent and routine care needs. 3. Standard insurance, coinsurance, copays and deductibles apply for chronic care management only during months in which we provide at least 20 minutes of these services. Most insurances cover these services at 100%, however patients may be responsible for any copay, coinsurance and/or deductible if applicable. This service may help you avoid the need for more expensive face-to-face services. 4. Only one practitioner may furnish and bill the service in a calendar month. 5. The patient may stop CCM services at any time (effective at the end of the month) by phone call to the office staff.  Patient agreed to services and verbal consent obtained.   The patient verbalized understanding of instructions provided today and agreed to receive a mailed copy of patient instruction and/or educational materials. Telephone follow up appointment with pharmacy  team member scheduled for: 03/09/2020  Anson Crofts, PharmD Clinical Pharmacist [facility] [place] [phone]   Food Choices for Gastroesophageal Reflux Disease, Adult When you have gastroesophageal  reflux disease (GERD), the foods you eat and your eating habits are very important. Choosing the right foods can help ease your discomfort. Think about working with a nutrition specialist (dietitian) to help you make good choices. What are tips for following this plan?  Meals  Choose healthy foods that are low in fat, such as fruits, vegetables, whole grains, low-fat dairy products, and lean meat, fish, and poultry.  Eat small meals often instead of 3 large meals a day. Eat your meals slowly, and in a place where you are relaxed. Avoid bending over or lying down until 2-3 hours after eating.  Avoid eating meals 2-3 hours before bed.  Avoid drinking a lot of liquid with meals.  Cook foods using methods other than frying. Bake, grill, or broil food instead.  Avoid or limit: ? Chocolate. ? Peppermint or spearmint. ? Alcohol. ? Pepper. ? Black and decaffeinated coffee. ? Black and decaffeinated tea. ? Bubbly (carbonated) soft drinks. ? Caffeinated energy drinks and soft drinks.  Limit high-fat foods such as: ? Fatty meat or fried foods. ? Whole milk, cream, butter, or ice cream. ? Nuts and nut butters. ? Pastries, donuts, and sweets made with butter or shortening.  Avoid foods that cause symptoms. These foods may be different for everyone. Common foods that cause symptoms include: ? Tomatoes. ? Oranges, lemons, and limes. ? Peppers. ? Spicy food. ? Onions and garlic. ? Vinegar. Lifestyle  Maintain a healthy weight. Ask your doctor what weight is healthy for you. If you need to lose weight, work with your doctor to do so safely.  Exercise for at least 30 minutes for 5 or more days each week, or as told by your doctor.  Wear loose-fitting clothes.  Do not smoke. If you need help quitting, ask your doctor.  Sleep with the head of your bed higher than your feet. Use a wedge under the mattress or blocks under the bed frame to raise the head of the bed. Summary  When you have  gastroesophageal reflux disease (GERD), food and lifestyle choices are very important in easing your symptoms.  Eat small meals often instead of 3 large meals a day. Eat your meals slowly, and in a place where you are relaxed.  Limit high-fat foods such as fatty meat or fried foods.  Avoid bending over or lying down until 2-3 hours after eating.  Avoid peppermint and spearmint, caffeine, alcohol, and chocolate. This information is not intended to replace advice given to you by your health care provider. Make sure you discuss any questions you have with your health care provider. Document Revised: 10/02/2018 Document Reviewed: 07/17/2016 Elsevier Patient Education  [place].

## 2019-12-09 DIAGNOSIS — Z933 Colostomy status: Secondary | ICD-10-CM | POA: Diagnosis not present

## 2019-12-15 DIAGNOSIS — R5381 Other malaise: Secondary | ICD-10-CM | POA: Diagnosis not present

## 2019-12-16 ENCOUNTER — Other Ambulatory Visit: Payer: Self-pay | Admitting: Interventional Radiology

## 2019-12-16 DIAGNOSIS — N2889 Other specified disorders of kidney and ureter: Secondary | ICD-10-CM

## 2019-12-21 ENCOUNTER — Other Ambulatory Visit: Payer: Self-pay

## 2019-12-21 ENCOUNTER — Ambulatory Visit (INDEPENDENT_AMBULATORY_CARE_PROVIDER_SITE_OTHER): Payer: Medicare Other | Admitting: Endocrinology

## 2019-12-21 ENCOUNTER — Encounter: Payer: Self-pay | Admitting: Endocrinology

## 2019-12-21 ENCOUNTER — Telehealth: Payer: Self-pay

## 2019-12-21 VITALS — BP 130/80 | HR 94

## 2019-12-21 DIAGNOSIS — M810 Age-related osteoporosis without current pathological fracture: Secondary | ICD-10-CM | POA: Diagnosis not present

## 2019-12-21 DIAGNOSIS — E041 Nontoxic single thyroid nodule: Secondary | ICD-10-CM

## 2019-12-21 LAB — VITAMIN D 25 HYDROXY (VIT D DEFICIENCY, FRACTURES): VITD: 66.24 ng/mL (ref 30.00–100.00)

## 2019-12-21 LAB — TSH: TSH: 1.31 u[IU]/mL (ref 0.35–4.50)

## 2019-12-21 NOTE — Progress Notes (Signed)
Subjective:    Patient ID: [name], female    DOB: 24-Dec-1955, 64 y.o.   MRN: [id number]  HPI Pt is referred by Dr Sarajane Jews, for osteoporosis.  Pt was noted to have osteoporosis in 2008.  She has never been on medication for this.  Only bony fracture was: C-spine (1986, with a fall) and left foot (2018, dropped object on it).   She has no history of any of the following: multiple myeloma, renal dz, thyroid problems, alcoholism, liver dz, primary hyperparathyroidism.  She does not take heparin or anticonvulsants.  She had menopause at age 86.  She has been on intermitt prednisone x 7 years, but none recently.  RA severely limits mobility.  She quit smoking in 2014, after many years.  She takes Vit-D, 2000 .  She took actonel 2010-2017.   Past Medical History:  Diagnosis Date  . Atrial flutter ([place])   . Chronic gout 2008  . Diverticulitis of large intestine with perforation   . Dysrhythmia   . Fibroid   . Hearing aid worn    pt wears bilateral hearing aids  . Hernia 4/08  . Hx of adenomatous polyp of colon 11/13/2016  . Hypertension   . Legally blind    since pt was a teenager  . Marfan's syndrome affecting skin    with scolosis  . Osteoporosis   . 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 Rheumatoid arthritis [facility])    sees Dr. Gavin Pound   . SBO (small bowel obstruction) ([place]) 01/05/2013  . Small bowel obstruction due to adhesions ([place])   . Status post bunionectomy 1/10   bilateral   . Stroke ([place]) 2/08  . Substance abuse ([place])    recovering alcoholic J28 years   . 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 ABLATION  01/04/14   atrial flutter ablation (2 circuits) by Dr Lovena Le  . ATRIAL FLUTTER ABLATION N/A 01/04/2014   Procedure: ATRIAL FLUTTER ABLATION;  Surgeon: Evans Lance, MD;  Location[facility] CATH LAB;  Service: Cardiovascular;  Laterality: N/A;  . BUNIONECTOMY Bilateral 1/10  . COLON SURGERY     colectomy    . COLOSTOMY  11/27/2012  . HERNIA REPAIR    . IR RADIOLOGIST EVAL & MGMT  10/01/2017  . IR RADIOLOGIST EVAL & MGMT  11/21/2017  . IR RADIOLOGIST EVAL & MGMT  02/13/2018  . IR RADIOLOGIST EVAL & MGMT  01/08/2019  . LAPAROSCOPIC ABDOMINAL EXPLORATION N/A 01/06/2013   Procedure: LAPAROSCOPIC ABDOMINAL EXPLORATION;  Surgeon: Adin Hector, MD;  Location: [facility] ORS;  Service: General;  Laterality: N/A;  . LAPAROSCOPIC LYSIS OF ADHESIONS N/A 01/06/2013   Procedure: LAPAROSCOPIC LYSIS OF ADHESIONS/ INTEROTOMY REPAIR;  Surgeon: Adin Hector, MD;  Location: [facility] ORS;  Service: General;  Laterality: N/A;  . LAPAROTOMY N/A 11/27/2012   Procedure: EXPLORATORY LAPAROTOMY SIGMOID COLECTOMY, COLOSTOMY;  Surgeon: Madilyn Hook, DO;  Location: [facility] ORS;  Service: General;  Laterality: N/A;  . RADIOLOGY WITH ANESTHESIA Left 10/23/2017   Procedure: CT RENAL CRYO AND BIOPSY;  Surgeon: Aletta Edouard, MD;  Location: [facility] ORS;  Service: Radiology;  Laterality: Left;  . TOTAL KNEE ARTHROPLASTY Bilateral 6/08    Social History   Socioeconomic History  . Marital status: Single    Spouse name: Not on file  . Number of children: 1  . Years of education: Not on file  . Highest education level: Not  on file  Occupational History  . Not on file  Tobacco Use  . Smoking status: Former Smoker    Packs/day: 1.00    Types: Cigarettes    Quit date: 11/27/2012    Years since quitting: 7.0  . Smokeless tobacco: Never Used  Vaping Use  . Vaping Use: Former  Substance and Sexual Activity  . Alcohol use: No    Alcohol/week: 0.0 standard drinks    Comment: recovering alcoholic sober since [age]  . Drug use: No  . Sexual activity: Not Currently    Partners: Male    Birth control/protection: None  Other Topics Concern  . Not on file  Social History Narrative  . Not on file   Social Determinants of Health   Financial Resource Strain: Low Risk   . Difficulty of Paying Living Expenses: Not hard at all  Food Insecurity:   .  Worried About Charity fundraiser in the Last Year:   . Arboriculturist in the Last Year:   Transportation Needs: No Transportation Needs  . Lack of Transportation (Medical): No  . Lack of Transportation (Non-Medical): No  Physical Activity:   . Days of Exercise per Week:   . Minutes of Exercise per Session:   Stress:   . Feeling of Stress :   Social Connections:   . Frequency of Communication with Friends and Family:   . Frequency of Social Gatherings with Friends and Family:   . Attends Religious Services:   . Active Member of Clubs or Organizations:   . Attends Archivist Meetings:   Marland Kitchen Marital Status:   Intimate Partner Violence:   . Fear of Current or Ex-Partner:   . Emotionally Abused:   Marland Kitchen Physically Abused:   . Sexually Abused:     Current Outpatient Medications on File Prior to Visit  Medication Sig Dispense Refill  . Abatacept (ORENCIA) 125 MG/ML SOSY Inject 125 mg into the skin every Monday.     Marland Kitchen albuterol (VENTOLIN HFA) 108 (90 Base) MCG/ACT inhaler Inhale 1 puff into the lungs every 4 (four) hours as needed for wheezing or shortness of breath. 18 g 11  . allopurinol (ZYLOPRIM) 100 MG tablet TAKE 1 TABLET(100 MG) BY MOUTH DAILY 90 tablet 0  . amLODipine (NORVASC) 10 MG tablet TAKE 1 TABLET(10 MG) BY MOUTH DAILY 90 tablet 0  . aspirin EC 81 MG tablet Take 1 tablet (81 mg total) by mouth daily.    Marland Kitchen CALCIUM PO Take 1 tablet by mouth 2 (two) times daily.    . cetirizine (ZYRTEC) 10 MG tablet Take 1 tablet (10 mg total) by mouth daily. 90 tablet 1  . Cholecalciferol (VITAMIN D) 2000 units CAPS Take 2,000 Units by mouth daily.    Marland Kitchen CIPRODEX OTIC suspension INSTILL 4 DROPS UNTO EACH EAR TWICE DAILY AS NEEDED 7.5 mL 5  . diclofenac sodium (VOLTAREN) 1 % GEL Apply 1 application topically 4 (four) times daily as needed (arthritis).    Marland Kitchen docusate sodium (COLACE) 100 MG capsule Take 100 mg by mouth 2 (two) times daily.    Marland Kitchen esomeprazole (NEXIUM) 40 MG capsule TAKE 1  CAPSULE(40 MG) BY MOUTH DAILY 30 capsule 11  . folic acid (FOLVITE) 694 MCG tablet Take 400 mcg by mouth daily.    Marland Kitchen HYDROcodone-acetaminophen (NORCO) 10-325 MG tablet Take 1 tablet by mouth every 6 (six) hours as needed for moderate pain. 120 tablet 0  . ketoconazole (NIZORAL) 2 % cream Apply 1 application  topically daily as needed for irritation.    . megestrol (MEGACE ES) 625 MG/5ML suspension take 5 milliliters by mouth three times daily before meals as needed for appetite 150 mL 3  . Methotrexate Sodium (METHOTREXATE, PF,) 50 MG/2ML injection Inject 0.5 mLs into the muscle every Monday.  0  . metoCLOPramide (REGLAN) 10 MG tablet Take 1 tablet (10 mg total) by mouth every 6 (six) hours. (Patient taking differently: Take 10 mg by mouth as needed. ) 120 tablet 5  . Multiple Vitamin (MULTIVITAMIN WITH MINERALS) TABS tablet Take 1 tablet by mouth daily.    . polyethylene glycol (MIRALAX / GLYCOLAX) 17 g packet Take 17 g by mouth daily as needed for mild constipation.    Marland Kitchen tiZANidine (ZANAFLEX) 4 MG tablet Take 1 tablet (4 mg total) by mouth 2 (two) times daily. 90 tablet 5  . traMADol (ULTRAM) 50 MG tablet TAKE 1-2 TABLETS BY MOUTH EVERY 6 HOURS AS NEEDED 180 tablet 5  . triamcinolone (KENALOG) 0.025 % cream Apply 1 application topically daily as needed (itching).    . vitamin B-12 (CYANOCOBALAMIN) 500 MCG tablet Take 500 mcg by mouth daily.    . vitamin C (ASCORBIC ACID) 500 MG tablet Take 500 mg by mouth daily.      No current facility-administered medications on file prior to visit.    Allergies  Allergen Reactions  . Spinach Itching, Rash and Other (See Comments)    Welts.    Family History  Problem Relation Age of Onset  . Heart attack Neg Hx   . Colon cancer Neg Hx   . Osteoporosis Neg Hx     BP 130/80   Pulse 94   LMP 06/25/2006 (LMP Unknown)   SpO2 96%    Review of Systems denies weight loss, hematuria, cold intolerance, insomnia, and falls.  She has chronic back pain  and chronic heartburn.      Objective:   Physical Exam VS: see vs page GEN: no distress.  In wheelchair HEAD: head: no deformity eyes: no periorbital swelling, no proptosis external nose and ears are normal NECK: Old healed surgical scar at the post neck.  supple, thyroid is not enlarged CHEST WALL: no deformity LUNGS: clear to auscultation CV: reg rate and rhythm, no murmur.  MUSCULOSKELETAL: muscle bulk and strength are grossly normal.  no obvious joint swelling.  gait is normal and steady EXTEMITIES: bilat hand deformities c/w RA.  no leg edema PULSES: no carotid bruit NEURO:  cn 2-12 grossly intact, except for chronic visual loss.   readily moves all 4's.  sensation is intact to touch on all 4's.  SKIN:  Normal texture and temperature.  No rash or suspicious lesion is visible.   NODES:  None palpable at the neck PSYCH: alert, well-oriented.  Does not appear anxious nor depressed.  DEXA: worst T-score is at right radius: -4.4  Lab Results  Component Value Date   CREATININE 0.67 04/20/2019   BUN 10 04/20/2019   NA 142 04/20/2019   K 4.2 04/20/2019   CL 105 04/20/2019   CO2 30 04/20/2019   Lab Results  Component Value Date   CALCIUM 9.4 04/20/2019   PHOS 3.4 01/08/2013   I have reviewed outside records, and summarized: Pt was noted to have low BMD, and referred here.  Goiter was noted, and bx requested    [place]: Evolution of multinodular goiter as above with enlargement of bilateral thyroid cysts. The dominant right thyroid cyst measuring 1.9 cm in greatest  diameter does have mildly complex features with a peripheral mural nodule present. This remains likely benign. This is borderline for needle sampling based on size but does appear to have enlarged significantly since the prior ultrasound in 2009. Consider ultrasound guided needle aspiration of the dominant right thyroid cyst. The dominant left thyroid cyst also shows enlargement since the prior study but does not  demonstrate any complex features. This can be followed by ultrasound.  1561 bx: BENIGN FOLLICULAR NODULE (BETHES[place] CATEGORY II).      Assessment & Plan:  Osteoporosis. She has several poss secondary causes.  She needs increased rx.   MNG: new to me  Patient Instructions  Let's recheck the ultrasound.  you will receive a phone call, about a day and time for an appointment. Blood tests are requested for you today.  We'll let you know about the results.  You should start taking "Prolia" (twice a year shot given here at the office). Take calcium 1200 mg per day, and vitamin-D, at least 400 units per day.   Please come back for a follow-up appointment in 1 year.

## 2019-12-21 NOTE — Telephone Encounter (Signed)
Per Dr. Loanne Drilling, pt will need to begin Prolia. Advised I would route this message to Wyatt Haste, LPN so he can begin the PA process.

## 2019-12-21 NOTE — Patient Instructions (Addendum)
Let's recheck the ultrasound.  you will receive a phone call, about a day and time for an appointment. Blood tests are requested for you today.  We'll let you know about the results.  You should start taking "Prolia" (twice a year shot given here at the office). Take calcium 1200 mg per day, and vitamin-D, at least 400 units per day.   Please come back for a follow-up appointment in 1 year.

## 2019-12-22 NOTE — Telephone Encounter (Signed)
Pt has been submitted to [employer] Portal to verify benefits and coverage for Prolia.

## 2019-12-23 LAB — PROTEIN ELECTROPHORESIS, SERUM
Albumin ELP: 4.1 g/dL (ref 3.8–4.8)
Alpha 1: 0.3 g/dL (ref 0.2–0.3)
Alpha 2: 0.7 g/dL (ref 0.5–0.9)
Beta 2: 0.4 g/dL (ref 0.2–0.5)
Beta Globulin: 0.5 g/dL (ref 0.4–0.6)
Gamma Globulin: 1 g/dL (ref 0.8–1.7)
Total Protein: 7 g/dL (ref 6.1–8.1)

## 2019-12-23 LAB — PTH, INTACT AND CALCIUM
Calcium: 9.6 mg/dL (ref 8.6–10.4)
PTH: 33 pg/mL (ref 14–64)

## 2019-12-25 ENCOUNTER — Telehealth: Payer: Self-pay

## 2019-12-25 NOTE — Telephone Encounter (Signed)
-----   Message from Renato Shin, MD sent at 12/24/2019  5:51 PM EDT ----- please contact patient: Normal--good

## 2019-12-25 NOTE — Telephone Encounter (Signed)
RESULTS  Results were reviewed by Dr. Ellison. A letter has been mailed to pt home address. For future reference, letter can be found in Epic. 

## 2019-12-29 ENCOUNTER — Ambulatory Visit
Admission: RE | Admit: 2019-12-29 | Discharge: 2019-12-29 | Disposition: A | Discharge status: Home / Self Care | Payer: Medicare Other | Source: Ambulatory Visit | Attending: Interventional Radiology | Admitting: Interventional Radiology

## 2019-12-29 ENCOUNTER — Encounter: Payer: Self-pay | Admitting: *Deleted

## 2019-12-29 ENCOUNTER — Other Ambulatory Visit: Payer: Self-pay

## 2019-12-29 DIAGNOSIS — N2889 Other specified disorders of kidney and ureter: Secondary | ICD-10-CM

## 2019-12-29 DIAGNOSIS — C642 Malignant neoplasm of left kidney, except renal pelvis: Secondary | ICD-10-CM | POA: Diagnosis not present

## 2019-12-29 HISTORY — PX: IR RADIOLOGIST EVAL & MGMT: IMG5224

## 2019-12-29 NOTE — Progress Notes (Signed)
Chief Complaint: Patient was consulted remotely today (TeleHealth) for follow up after cryoablation of a left renal carcinoma.  History of Present Illness: [name] is a 64 y.o. 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  She did quite well following the procedure and remains asymptomatic.   Past Medical History:  Diagnosis Date  . Atrial flutter ([place])   . Chronic gout 2008  . Diverticulitis of large intestine with perforation   . Dysrhythmia   . Fibroid   . Hearing aid worn    pt wears bilateral hearing aids  . Hernia 4/08  . Hx of adenomatous polyp of colon 11/13/2016  . Hypertension   . Legally blind    since pt was a teenager  . Marfan's syndrome affecting skin    with scolosis  . Osteoporosis   . 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 Rheumatoid arthritis [facility])    sees Dr. Gavin Pound   . SBO (small bowel obstruction) ([place]) 01/05/2013  . Small bowel obstruction due to adhesions ([place])   . Status post bunionectomy 1/10   bilateral   . Stroke ([place]) 2/08  . Substance abuse ([place])    recovering alcoholic X38 years   . 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 ABLATION  01/04/14   atrial flutter ablation (2 circuits) by Dr Lovena Le  . ATRIAL FLUTTER ABLATION N/A 01/04/2014   Procedure: ATRIAL FLUTTER ABLATION;  Surgeon: Evans Lance, MD;  Location: [facility] CATH LAB;  Service: Cardiovascular;  Laterality: N/A;  . BUNIONECTOMY Bilateral 1/10  . COLON SURGERY     colectomy  . COLOSTOMY  11/27/2012  . HERNIA REPAIR    . IR RADIOLOGIST EVAL & MGMT  10/01/2017  . IR RADIOLOGIST EVAL & MGMT  11/21/2017  . IR RADIOLOGIST EVAL & MGMT  02/13/2018  . IR RADIOLOGIST EVAL & MGMT  01/08/2019  . LAPAROSCOPIC ABDOMINAL EXPLORATION N/A 01/06/2013   Procedure: LAPAROSCOPIC ABDOMINAL EXPLORATION;  Surgeon: Adin Hector, MD;  Location: [facility] ORS;  Service: General;  Laterality: N/A;  .  LAPAROSCOPIC LYSIS OF ADHESIONS N/A 01/06/2013   Procedure: LAPAROSCOPIC LYSIS OF ADHESIONS/ INTEROTOMY REPAIR;  Surgeon: Adin Hector, MD;  Location: [facility] ORS;  Service: General;  Laterality: N/A;  . LAPAROTOMY N/A 11/27/2012   Procedure: EXPLORATORY LAPAROTOMY SIGMOID COLECTOMY, COLOSTOMY;  Surgeon: Madilyn Hook, DO;  Location: [facility] ORS;  Service: General;  Laterality: N/A;  . RADIOLOGY WITH ANESTHESIA Left 10/23/2017   Procedure: CT RENAL CRYO AND BIOPSY;  Surgeon: Aletta Edouard, MD;  Location: [facility] ORS;  Service: Radiology;  Laterality: Left;  . TOTAL KNEE ARTHROPLASTY Bilateral 6/08    Allergies: Spinach  Medications: Prior to Admission medications   Medication Sig Start Date End Date Taking? Authorizing Provider  Abatacept (ORENCIA) 125 MG/ML SOSY Inject 125 mg into the skin every Monday.     [provider]  albuterol (VENTOLIN HFA) 108 (90 Base) MCG/ACT inhaler Inhale 1 puff into the lungs every 4 (four) hours as needed for wheezing or shortness of breath. 10/19/19   [name], MD  allopurinol (ZYLOPRIM) 100 MG tablet TAKE 1 TABLET(100 MG) BY MOUTH DAILY 10/15/19   [name], MD  amLODipine (NORVASC) 10 MG tablet TAKE 1 TABLET(10 MG) BY MOUTH DAILY 10/15/19   [name], MD  aspirin EC 81 MG tablet Take 1 tablet (81 mg total) by mouth  daily. 05/11/14   Verlee Monte, MD  CALCIUM PO Take 1 tablet by mouth 2 (two) times daily.    [provider]  cetirizine (ZYRTEC) 10 MG tablet Take 1 tablet (10 mg total) by mouth daily. 11/17/14   [name], MD  Cholecalciferol (VITAMIN D) 2000 units CAPS Take 2,000 Units by mouth daily.    [provider]  CIPRODEX OTIC suspension INSTILL 4 DROPS UNTO EACH EAR TWICE DAILY AS NEEDED 06/19/18   [name], MD  diclofenac sodium (VOLTAREN) 1 % GEL Apply 1 application topically 4 (four) times daily as needed (arthritis).    [provider]  docusate sodium (COLACE) 100 MG capsule Take 100 mg by mouth 2  (two) times daily.    [provider]  esomeprazole (NEXIUM) 40 MG capsule TAKE 1 CAPSULE(40 MG) BY MOUTH DAILY 04/30/19   [name], MD  folic acid (FOLVITE) 465 MCG tablet Take 400 mcg by mouth daily.    [provider]  HYDROcodone-acetaminophen (NORCO) 10-325 MG tablet Take 1 tablet by mouth every 6 (six) hours as needed for moderate pain. 12/19/19 01/18/20  [name], MD  ketoconazole (NIZORAL) 2 % cream Apply 1 application topically daily as needed for irritation.    [provider]  megestrol (MEGACE ES) 625 MG/5ML suspension take 5 milliliters by mouth three times daily before meals as needed for appetite 10/08/18   [name], MD  Methotrexate Sodium (METHOTREXATE, PF,) 50 MG/2ML injection Inject 0.5 mLs into the muscle every Monday. 05/14/16   [provider]  metoCLOPramide (REGLAN) 10 MG tablet Take 1 tablet (10 mg total) by mouth every 6 (six) hours. Patient taking differently: Take 10 mg by mouth as needed.  10/08/18   [name], MD  Multiple Vitamin (MULTIVITAMIN WITH MINERALS) TABS tablet Take 1 tablet by mouth daily.    [provider]  polyethylene glycol (MIRALAX / GLYCOLAX) 17 g packet Take 17 g by mouth daily as needed for mild constipation.    [provider]  tiZANidine (ZANAFLEX) 4 MG tablet Take 1 tablet (4 mg total) by mouth 2 (two) times daily. 11/17/19   [name], MD  traMADol (ULTRAM) 50 MG tablet TAKE 1-2 TABLETS BY MOUTH EVERY 6 HOURS AS NEEDED 08/07/19   [name], MD  triamcinolone (KENALOG) 0.025 % cream Apply 1 application topically daily as needed (itching).    [provider]  vitamin B-12 (CYANOCOBALAMIN) 500 MCG tablet Take 500 mcg by mouth daily.    [provider]  vitamin C (ASCORBIC ACID) 500 MG tablet Take 500 mg by mouth daily.     [provider]     Family History  Problem Relation Age of Onset  . Heart attack Neg Hx   . Colon cancer Neg Hx   .  Osteoporosis Neg Hx     Social History   Socioeconomic History  . Marital status: Single    Spouse name: Not on file  . Number of children: 1  . Years of education: Not on file  . Highest education level: Not on file  Occupational History  . Not on file  Tobacco Use  . Smoking status: Former Smoker    Packs/day: 1.00    Types: Cigarettes    Quit date: 11/27/2012    Years since quitting: 7.0  . Smokeless tobacco: Never Used  Vaping Use  . Vaping Use: Former  Substance and Sexual Activity  . Alcohol  use: No    Alcohol/week: 0.0 standard drinks    Comment: recovering alcoholic sober since 1962  . Drug use: No  . Sexual activity: Not Currently    Partners: Male    Birth control/protection: None  Other Topics Concern  . Not on file  Social History Narrative  . Not on file   Social Determinants of Health   Financial Resource Strain: Low Risk   . Difficulty of Paying Living Expenses: Not hard at all  Food Insecurity:   . Worried About Charity fundraiser in the Last Year:   . Arboriculturist in the Last Year:   Transportation Needs: No Transportation Needs  . Lack of Transportation (Medical): No  . Lack of Transportation (Non-Medical): No  Physical Activity:   . Days of Exercise per Week:   . Minutes of Exercise per Session:   Stress:   . Feeling of Stress :   Social Connections:   . Frequency of Communication with Friends and Family:   . Frequency of Social Gatherings with Friends and Family:   . Attends Religious Services:   . Active Member of Clubs or Organizations:   . Attends Archivist Meetings:   Marland Kitchen Marital Status:     ECOG Status: 0 - Asymptomatic  Review of Systems  Constitutional: Negative.   Respiratory: Negative.   Cardiovascular: Negative.   Gastrointestinal: Negative.   Genitourinary: Negative.   Musculoskeletal: Negative.   Neurological: Negative.     Review of Systems: A 12 point ROS discussed and pertinent positives are  indicated in the HPI above.  All other systems are negative.  Physical Exam No direct physical exam was performed (except for noted visual exam findings with Video Visits).   Vital Signs: LMP 06/25/2006 (LMP Unknown)   Imaging: No results found.  Labs:  CBC: No results for input(s): WBC, HGB, HCT, PLT in the last 8760 hours.  COAGS: No results for input(s): INR, APTT in the last 8760 hours.  BMP: Recent Labs    01/05/19 0735 04/20/19 1202 12/21/19 1504  NA  --  142  --   K  --  4.2  --   CL  --  105  --   CO2  --  30  --   GLUCOSE  --  102*  --   BUN  --  10  --   CALCIUM  --  9.4 9.6  CREATININE 0.80 0.67  --     LIVER FUNCTION TESTS: Recent Labs    12/21/19 1504  PROT 7.0    TUMOR MARKERS: No results for input(s): AFPTM, CEA, CA199, CHROMGRNA in the last 8760 hours.  Assessment and Plan:  I spoke with Mrs. Porta over the phone.  A follow-up CT of the abdomen performed at [facility] on 09/14/2019 demonstrates further retraction of an ablation defect in the posterior left kidney without evidence of tumor recurrence.  Small low-density area in the anterior interpolar right kidney appears similar compared to some prior CT studies and may represent an area of scarring or a cyst.  No suspicious enhancing lesions are identified.  There is no evidence of left papillary renal carcinoma recurrence 2 years after ablation.  I recommended another follow-up CT in 1 year.  Electronically Signed: Azzie Roup 12/29/2019, 2:30 PM     I spent a total of 10 Minutes in remote  clinical consultation, greater than 50% of which was counseling/coordinating care post ablation of a left renal carcinoma .  Visit type: Audio only (telephone). Audio (no video) only due to patient's lack of internet/smartphone capability. Alternative for in-person consultation at [facility], [address] [place], [place], [place]. This visit type was conducted due to national  recommendations for restrictions regarding the COVID-19 Pandemic (e.g. social distancing).  This format is felt to be most appropriate for this patient at this time.  All issues noted in this document were discussed and addressed.

## 2019-12-31 ENCOUNTER — Other Ambulatory Visit: Payer: Medicare Other

## 2020-01-08 ENCOUNTER — Other Ambulatory Visit: Payer: Self-pay | Admitting: Family Medicine

## 2020-01-11 ENCOUNTER — Ambulatory Visit
Admission: RE | Admit: 2020-01-11 | Discharge: 2020-01-11 | Disposition: A | Discharge status: Home / Self Care | Payer: Medicare Other | Source: Ambulatory Visit | Attending: Endocrinology | Admitting: Endocrinology

## 2020-01-11 DIAGNOSIS — E041 Nontoxic single thyroid nodule: Secondary | ICD-10-CM | POA: Diagnosis not present

## 2020-01-13 DIAGNOSIS — R5381 Other malaise: Secondary | ICD-10-CM | POA: Diagnosis not present

## 2020-01-13 DIAGNOSIS — Z933 Colostomy status: Secondary | ICD-10-CM | POA: Diagnosis not present

## 2020-01-18 ENCOUNTER — Other Ambulatory Visit: Payer: Self-pay

## 2020-01-18 ENCOUNTER — Telehealth (INDEPENDENT_AMBULATORY_CARE_PROVIDER_SITE_OTHER): Payer: Medicare Other | Admitting: Family Medicine

## 2020-01-18 ENCOUNTER — Encounter: Payer: Self-pay | Admitting: Family Medicine

## 2020-01-18 DIAGNOSIS — M069 Rheumatoid arthritis, unspecified: Secondary | ICD-10-CM | POA: Diagnosis not present

## 2020-01-18 DIAGNOSIS — F1190 Opioid use, unspecified, uncomplicated: Secondary | ICD-10-CM

## 2020-01-18 MED ORDER — HYDROCODONE-ACETAMINOPHEN 10-325 MG PO TABS: 1.0000 | ORAL_TABLET | Freq: Four times a day (QID) | ORAL | 0 refills | Status: AC | PRN

## 2020-01-18 MED ORDER — HYDROCODONE-ACETAMINOPHEN 10-325 MG PO TABS: 1.0000 | ORAL_TABLET | Freq: Four times a day (QID) | ORAL | 0 refills | Status: DC | PRN

## 2020-01-18 MED ORDER — METOCLOPRAMIDE HCL 10 MG PO TABS
10.0000 mg | ORAL_TABLET | Freq: Four times a day (QID) | ORAL | 5 refills | Status: DC
Start: 2020-01-18 — End: 2020-05-07

## 2020-01-18 NOTE — Progress Notes (Signed)
   Subjective:    Patient ID: [name], female    DOB: 16-Aug-1955, 64 y.o.   MRN: [id number]  HPI Virtual Visit via Telephone Note  I connected with the patient on 01/18/20 at  2:30 PM EDT by telephone and verified that I am speaking with the correct person using two identifiers.   I discussed the limitations, risks, security and privacy concerns of performing an evaluation and management service by telephone and the availability of in person appointments. I also discussed with the patient that there may be a patient responsible charge related to this service. The patient expressed understanding and agreed to proceed.  Location patient: home Location provider: work or home office Participants present for the call: patient, provider Patient did not have a visit in the prior 7 days to address this/these issue(s).   History of Present Illness: Here for pain management, she is doing well.  Indication for chronic opioid: rheumatoid arthritis  Medication and dose: Norco 10-325 # pills per month: 120 Last UDS date: 07-21-18 Opioid Treatment Agreement signed (Y/N): 07-12-17 Opioid Treatment Agreement last reviewed with patient:  01-18-20 N[facility] reviewed this encounter (include red flags): Yes    Observations/Objective: Patient sounds cheerful and well on the phone. I do not appreciate any SOB. Speech and thought processing are grossly intact. Patient reported vitals:  Assessment and Plan: Pain management, meds were refilled.  [name], MD   Follow Up Instructions:     [phone] 5-10 [phone] 11-20 9443 21-30 I did not refer this patient for an OV in the next 24 hours for this/these issue(s).  I discussed the assessment and treatment plan with the patient. The patient was provided an opportunity to ask questions and all were answered. The patient agreed with the plan and demonstrated an understanding of the instructions.   The patient was advised to call back or seek an  in-person evaluation if the symptoms worsen or if the condition fails to improve as anticipated.  I provided 12 minutes of non-face-to-face time during this encounter.   [name], MD    Review of Systems     Objective:   Physical Exam        Assessment & Plan:

## 2020-02-03 NOTE — Telephone Encounter (Signed)
Summary of benefits indicates that the patient does have coverage for Prolia. Patient does not require a PA, and pt owes 20% coinsurance for medication and administration, but secondary insurance picks this up and pays in full. Patient will owe $0. Called pt and scheduled her for injection on 02/24/2020

## 2020-02-11 ENCOUNTER — Other Ambulatory Visit: Payer: Self-pay | Admitting: Family Medicine

## 2020-02-11 NOTE — Telephone Encounter (Signed)
Last filled 08/16/2019 Last OV 01/18/2020  Ok to fill?

## 2020-02-15 DIAGNOSIS — M810 Age-related osteoporosis without current pathological fracture: Secondary | ICD-10-CM | POA: Diagnosis not present

## 2020-02-15 DIAGNOSIS — M0549 Rheumatoid myopathy with rheumatoid arthritis of multiple sites: Secondary | ICD-10-CM | POA: Diagnosis not present

## 2020-02-15 DIAGNOSIS — M1A09X0 Idiopathic chronic gout, multiple sites, without tophus (tophi): Secondary | ICD-10-CM | POA: Diagnosis not present

## 2020-02-15 DIAGNOSIS — M0579 Rheumatoid arthritis with rheumatoid factor of multiple sites without organ or systems involvement: Secondary | ICD-10-CM | POA: Diagnosis not present

## 2020-02-15 DIAGNOSIS — M2550 Pain in unspecified joint: Secondary | ICD-10-CM | POA: Diagnosis not present

## 2020-02-15 DIAGNOSIS — Z79899 Other long term (current) drug therapy: Secondary | ICD-10-CM | POA: Diagnosis not present

## 2020-02-15 DIAGNOSIS — Z933 Colostomy status: Secondary | ICD-10-CM | POA: Diagnosis not present

## 2020-02-24 ENCOUNTER — Ambulatory Visit: Payer: Medicare Other

## 2020-02-25 ENCOUNTER — Other Ambulatory Visit: Payer: Self-pay

## 2020-02-25 ENCOUNTER — Ambulatory Visit (INDEPENDENT_AMBULATORY_CARE_PROVIDER_SITE_OTHER): Payer: Medicare Other

## 2020-02-25 DIAGNOSIS — M810 Age-related osteoporosis without current pathological fracture: Secondary | ICD-10-CM | POA: Diagnosis not present

## 2020-02-25 MED ORDER — DENOSUMAB 60 MG/ML ~~LOC~~ SOSY
60.0000 mg | PREFILLED_SYRINGE | Freq: Once | SUBCUTANEOUS | Status: AC
  Administered 2020-02-25: 60 mg via SUBCUTANEOUS

## 2020-02-25 NOTE — Progress Notes (Signed)
Pt presents today for nurse visit for an injection. Per Dr. Loanne Drilling, injection of Prolia given SQ in R arm today by A. Eversole, LPN. Denies any adverse reactions or discomfort.

## 2020-03-08 ENCOUNTER — Telehealth: Payer: Self-pay | Admitting: Pharmacist

## 2020-03-08 NOTE — Progress Notes (Signed)
I left the patient a message about her upcoming appointment on 03/09/2020 @ 3:30pm  with the clinical pharmacist. She was asked to please have all medication on had to review the pharmacist.

## 2020-03-09 ENCOUNTER — Ambulatory Visit: Payer: Medicare Other

## 2020-03-09 DIAGNOSIS — I10 Essential (primary) hypertension: Secondary | ICD-10-CM

## 2020-03-09 DIAGNOSIS — M810 Age-related osteoporosis without current pathological fracture: Secondary | ICD-10-CM

## 2020-03-09 NOTE — Chronic Care Management (AMB) (Signed)
Chronic Care Management Pharmacy  Name: [name]  MRN: [id number] DOB: Nov 16, 1955  Initial Questions: 1. Have you seen any other providers since your last visit? Yes - video visit with Dr. Sarajane Jews 2. Any changes in your medicines or health? Yes - started Prolia  Chief Complaint/ HPI  [name],  64 y.o. , female presents for their Follow-Up CCM visit with the clinical pharmacist via telephone due to COVID-19 Pandemic.  PCP : [name], MD  Their chronic conditions include: HTN, HLD, Hx of TIA, Gout, GERD, RA, Pain, Osteoporosis  Office Visits: 01/18/20- [name], MD- patient presented for video visit for pain management. Norco 10/325mg  was refilled. No SOB noted.  10/19/2019- [name], MD- patient for office visit for pain management for  rheumatoid arthritis. No SOB noted. Norco 10/325mg  was refilled.   07/20/2019- [name], MD- patient for office visit for pain management for rheumatoid arthritis. No SOB noted.   04/20/2019-  [name], MD- patient for office visit for pain management. Norco 10/325mg  was refilled.   Consult Visit: 02/25/20  - Endocrinology - Aleatha Borer, LPN - Patient presented for first Prolia injection in office.  12/21/19 - Endocrinology - Renato Shin, MD - patient presented for initial osteoporosis visit. Pt has not been on medications for this. Prescribed Prolia and recommended calcium 1200 mg/day and vitamin D 400 units/day. Follow up in 1 year.  10/19/2019- Cardiology- Cecilie Kicks, RN- patient presented for virtual visit for atrial flutter. Suspicion of Marfan's syndrome. ECHO was stable and patient was euvolemic. No awareness of irregular HR and HTN was controlled. Patient to follow up in person in 6 months.   03/04/2019- Cardiology- Daneen Schick, MD- Patient presented for follow up. Patient to obtain ECHO to rule out systolic dysfunction or other change in status.   Medications: Outpatient Encounter Medications as of 03/09/2020    Medication Sig  . Abatacept (ORENCIA) 125 MG/ML SOSY Inject 125 mg into the skin every Monday.   Marland Kitchen albuterol (VENTOLIN HFA) 108 (90 Base) MCG/ACT inhaler Inhale 1 puff into the lungs every 4 (four) hours as needed for wheezing or shortness of breath.  . allopurinol (ZYLOPRIM) 100 MG tablet TAKE 1 TABLET(100 MG) BY MOUTH DAILY  . amLODipine (NORVASC) 10 MG tablet TAKE 1 TABLET(10 MG) BY MOUTH DAILY  . aspirin EC 81 MG tablet Take 1 tablet (81 mg total) by mouth daily.  Marland Kitchen CALCIUM PO Take 1 tablet by mouth 2 (two) times daily.  . cetirizine (ZYRTEC) 10 MG tablet Take 1 tablet (10 mg total) by mouth daily.  . Cholecalciferol (VITAMIN D) 2000 units CAPS Take 2,000 Units by mouth daily.  Marland Kitchen CIPRODEX OTIC suspension INSTILL 4 DROPS UNTO EACH EAR TWICE DAILY AS NEEDED  . diclofenac sodium (VOLTAREN) 1 % GEL Apply 1 application topically 4 (four) times daily as needed (arthritis).  Marland Kitchen docusate sodium (COLACE) 100 MG capsule Take 100 mg by mouth 2 (two) times daily.  Marland Kitchen esomeprazole (NEXIUM) 40 MG capsule TAKE 1 CAPSULE(40 MG) BY MOUTH DAILY  . folic acid (FOLVITE) 469 MCG tablet Take 400 mcg by mouth daily.  Derrill Memo ON 03/20/2020] HYDROcodone-acetaminophen (NORCO) 10-325 MG tablet Take 1 tablet by mouth every 6 (six) hours as needed for moderate pain.  Marland Kitchen ketoconazole (NIZORAL) 2 % cream Apply 1 application topically daily as needed for irritation.  . megestrol (MEGACE ES) 625 MG/5ML suspension take 5 milliliters by mouth three times daily before meals as needed for appetite  . Methotrexate Sodium (  METHOTREXATE, PF,) 50 MG/2ML injection Inject 0.5 mLs into the muscle every Monday.  . metoCLOPramide (REGLAN) 10 MG tablet Take 1 tablet (10 mg total) by mouth every 6 (six) hours.  . Multiple Vitamin (MULTIVITAMIN WITH MINERALS) TABS tablet Take 1 tablet by mouth daily.  . polyethylene glycol (MIRALAX / GLYCOLAX) 17 g packet Take 17 g by mouth daily as needed for mild constipation.  Marland Kitchen tiZANidine (ZANAFLEX) 4  MG tablet Take 1 tablet (4 mg total) by mouth 2 (two) times daily.  . traMADol (ULTRAM) 50 MG tablet TAKE 1 TO 2 TABLETS BY MOUTH EVERY 6 HOURS AS NEEDED  . triamcinolone (KENALOG) 0.025 % cream Apply 1 application topically daily as needed (itching).  . vitamin B-12 (CYANOCOBALAMIN) 500 MCG tablet Take 500 mcg by mouth daily.  . vitamin C (ASCORBIC ACID) 500 MG tablet Take 500 mg by mouth daily.    No facility-administered encounter medications on file as of 03/09/2020.    Current Diagnosis/Assessment:  Goals Addressed            This Visit's Progress   . Pharmacy Care Plan       CARE PLAN ENTRY  Current Barriers:  . Chronic Disease Management support, education, and care coordination needs related to Hypertension, Hyperlipidemia, GERD, and History of stroke   Hypertension . Pharmacist Clinical Goal(s): o Over the next 00 days, patient will work with PharmD and providers to maintain BP goal <130/80 . Current regimen:  o Amlodipine 10mg ,1 tablet once daily . Patient self care activities - Over the next 90 days, patient will: o Check BP as directed, document, and provide at future appointments o Ensure daily salt intake < 2300 mg/day  Hyperlipidemia . Pharmacist Clinical Goal(s): o Over the next 120 days, patient will work with PharmD and providers to maintain LDL goal < 100 . Current regimen:  o No medications . Interventions: o Recommend cholesterol blood work at next physical with Dr. Sarajane Jews.  . Patient self care activities - Over the next 120 days, patient will: o Schedule physical with Dr. Sarajane Jews.   GERD . Pharmacist Clinical Goal(s) o Over the next 120 days, patient will work with PharmD and providers to minimize acid reflux symptoms.  . Current regimen:   Esomeprazole 40mg , 1 capsule once daily  . Interventions: o Discussed non-pharmacological interventions for acid reflux. Take measures to prevent acid reflux, such as avoiding spicy foods, avoiding caffeine, avoid  laying down a few hours after eating, and raising the head of the bed . Patient self care activities - Over the next 120 days, patient will: o Avoid trigger foods.  o Take Tums as needed for breakthrough heartburn  History of stroke . Pharmacist Clinical Goal(s) o Over the next 120 days, patient will work with PharmD and providers to take aspirin to prevent stroke. . Current regimen:  o Aspirin 81mg , 1 tablet once daily . Patient self care activities o Patient will continue current medications.   Medication management . Pharmacist Clinical Goal(s): o Over the next 90 days, patient will work with PharmD and providers to maintain optimal medication adherence . Current pharmacy: [facility] . Interventions o Comprehensive medication review performed. o Continue current medication management strategy . Patient self care activities - Over the next 90 days, patient will: o Take medications as prescribed o Report any questions or concerns to PharmD and/or provider(s)  Please see past updates related to this goal by clicking on the "Past Updates" button in the selected goal  SDOH Interventions     Most Recent Value  SDOH Interventions  Financial Strain Interventions Intervention Not Indicated  Transportation Interventions Intervention Not Indicated     SDOH Interventions     Most Recent Value  SDOH Interventions  Financial Strain Interventions Intervention Not Indicated  Transportation Interventions Intervention Not Indicated      Hypertension  Denies dizziness/ lightheadedness. Patient endorses a mild headache.  Office blood pressures are  BP Readings from Last 3 Encounters:  12/21/19 130/80  09/29/19 123/89  04/20/19 112/72   Patient has failed these meds in the past: metoprolol (patient preference)   Patient checks BP at home infrequently  Patient home BP readings are ranging: 94/63 mmHg (last night)  Patient is controlled on:   Amlodipine 10mg ,1 tablet once  daily   We discussed: the importance of regularly checking blood pressure at home  Plan Continue current medications  Patient will check blood pressure at home a few times weekly and keep a record. Follow up in 1 month with CPA for BP assessment.  Hyperlipidemia  LDL goal of < 70  Lipid Panel     Component Value Date/Time   CHOL 142 12/02/2012 0356   TRIG 132 01/06/2013 0512   HDL 46 12/02/2012 0356   CHOLHDL 3.1 12/02/2012 0356   VLDL 22 12/02/2012 0356   LDLCALC 74 12/02/2012 0356     The ASCVD Risk score (Goff DC Jr., et al., 2013) failed to calculate for the following reasons:   The patient has a prior MI or stroke diagnosis   Patient has failed these meds in past: simvastatin (myalgias)   Patient is currently on the following medications:  - no medications   Plan Recommend lipid panel for further assessment.    Hx of TIA  Patient is currently controlled on the following medications:   Aspirin 81mg , 1 tablet once daily  Plan Continue current medications  Gout   Patient does not remember last flare up.   No results found for: [facility]  Patient is currently controlled on the following medications:   Allopurinol 100mg , 1 tablet once daily   Plan Continue current medications  Recommend repeat uric acid level.   GERD  Patient reports running out of medicine earlier than pharmacy allowed her to fill last month.  Patient reports trigger foods exacerbating symptoms.   Patient is currently controlled on the following medications:  Esomeprazole 40mg , 1 capsule once daily   Discussed non-pharmacological interventions for acid reflux. Take measures to prevent acid reflux, such as avoiding spicy foods, avoiding caffeine, avoid laying down a few hours after eating, and raising the head of the bed   Plan Continue current medications  Recommended Tums as needed for heartburn or if out of Nexium.  RA  Patient reported doing okay.   Patient is currently  controlled on the following medications:   Methotrexate injection, inject 0.5MLs into muscle every Monday   Folic acid 630ZSW, 1 tablet once daily  Orencia 125mg , inject 125mg  every Monday    Plan Managed by rheumatologist.  Continue current medications   Pain   Patient is currently controlled on the following medications:   Hydrocodone/ APAP 10/325mg , 1 tablet every six hours as needed for moderate pain (will take if tramadol doesn't work)   Diclofenac 1% gel, apply topically four times daily as needed  Tramadol 50mg , 1 to 2 tablets every six hours as needed  (takes at least 2x/ day)  Tizanidine 4mg , 1 tablet twice daily    We  discussed:  -- denies CNS depression.   Plan Continue current medications  Osteoporosis   Last DEXA Scan: 10/05/2019  T-Score femoral neck: R: -1.9; L: -2.9  T-Score distal radius: -4.2  VITD  Date Value Ref Range Status  12/21/2019 66.24 30.00 - 100.00 ng/mL Final   Patient is a candidate for pharmacologic treatment due to T-Score < -2.5 in femoral neck  Patient has failed these meds in past: none Patient is currently controlled on the following medications:   Cholecalciferol (vitamin D) 2000 units, 1 capsule once daily  Calcium 1200 mg, 1 tablet twice daily  Prolia every 6 months (first dose on 9/2)  We discussed:  Recommend [id number] units of vitamin D daily. Recommend 1200 mg of calcium daily from dietary and supplemental sources. Recommend weight-bearing and muscle strengthening exercises for building and maintaining bone density.  Plan  Continue current medications and control with diet and exercise   Bronchitis  Bronchitis per patient report. Patient reports using less often now. Currently using once a day (at most).   Patient is currently controlled on the following medications:   Albuterol HFA, 1 puff every 4 hours as needed for wheezing or shortness of breath   Plan Continue current medications  OTC/ supplements    Cetirizine 10mg , 1 tablet once daily  Docusate 100mg , 1 capsule twice daily   keoconazole 2% cream, apply once daily for irritation  Metoclopramide 10mg ,1 tablet every six hours   Multivitamin tablets, 1 tablet once daily  Miralax, 17 g daily as needed for constipation ( not everyday bc does not have someone to help change bag)   Triamcinolone 00025% cream, apply daily as needed for itching  Vitamin B12 554mcg, 1 tablet once daily  Vitamin C 500mg , 1 tablet once daily    Medication Management  Patient organizes medications: daughter helps fill pill box every week.    Primary pharmacy: [facility] Adherence: No gaps in refill history (per medication dispense report from 09/11/2019 to 03/08/2020)     Follow up Follow up visit with PharmD in 4 months. Follow up in 1 month with CPA for BP assessment.   [name], PharmD Clinical Pharmacist [place] [facility] [place]

## 2020-03-15 DIAGNOSIS — M0549 Rheumatoid myopathy with rheumatoid arthritis of multiple sites: Secondary | ICD-10-CM | POA: Diagnosis not present

## 2020-03-18 DIAGNOSIS — Z933 Colostomy status: Secondary | ICD-10-CM | POA: Diagnosis not present

## 2020-03-31 NOTE — Progress Notes (Signed)
Cardiology Office Note:    Date:  04/04/2020   ID:  [name], DOB 07-Nov-1955, MRN [id number]  PCP:  [name], MD  Cardiologist:  Sinclair Grooms, MD   Referring MD: [name], MD   Chief Complaint  Patient presents with  . Congestive Heart Failure  . Hypertension  . Hyperlipidemia    History of Present Illness:    [name] is a 64 y.o. female with a hx of legal blindness, atrial flutter, tachycardia induced LV systolic dysfunction recovered to normal, prior stroke, rheumatoid arthritis, essential hypertension, prior history of alcohol abuse and, possible Marfan's.  Syndrome.  She is accompanied by her daughter.  She is in a wheelchair.  She has obvious kyphoscoliosis and ulnar deviation of her fingers bilaterally.  She has no cardiopulmonary complaints.  She denies orthopnea, PND, back pain not related to chronic discomfort from arthritis and skeletal deformity.  She has not had lower extremity swelling.  There is no significant palpitation by episodes of syncope.  Past Medical History:  Diagnosis Date  . Atrial flutter ([place])   . Chronic gout 2008  . Diverticulitis of large intestine with perforation   . Dysrhythmia   . Fibroid   . Hearing aid worn    pt wears bilateral hearing aids  . Hernia 4/08  . Hx of adenomatous polyp of colon 11/13/2016  . Hypertension   . Legally blind    since pt was a teenager  . Marfan's syndrome affecting skin    with scolosis  . Osteoporosis   . 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 Rheumatoid arthritis [facility])    sees Dr. Gavin Pound   . SBO (small bowel obstruction) ([place]) 01/05/2013  . Small bowel obstruction due to adhesions ([place])   . Status post bunionectomy 1/10   bilateral   . Stroke ([place]) 2/08  . Substance abuse ([place])    recovering alcoholic T55 years   . 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 ABLATION  01/04/14   atrial flutter  ablation (2 circuits) by Dr Lovena Le  . ATRIAL FLUTTER ABLATION N/A 01/04/2014   Procedure: ATRIAL FLUTTER ABLATION;  Surgeon: Evans Lance, MD;  Location: [facility] CATH LAB;  Service: Cardiovascular;  Laterality: N/A;  . BUNIONECTOMY Bilateral 1/10  . COLON SURGERY     colectomy  . COLOSTOMY  11/27/2012  . HERNIA REPAIR    . IR RADIOLOGIST EVAL & MGMT  10/01/2017  . IR RADIOLOGIST EVAL & MGMT  11/21/2017  . IR RADIOLOGIST EVAL & MGMT  02/13/2018  . IR RADIOLOGIST EVAL & MGMT  01/08/2019  . IR RADIOLOGIST EVAL & MGMT  12/29/2019  . LAPAROSCOPIC ABDOMINAL EXPLORATION N/A 01/06/2013   Procedure: LAPAROSCOPIC ABDOMINAL EXPLORATION;  Surgeon: Adin Hector, MD;  Location: [facility] ORS;  Service: General;  Laterality: N/A;  . LAPAROSCOPIC LYSIS OF ADHESIONS N/A 01/06/2013   Procedure: LAPAROSCOPIC LYSIS OF ADHESIONS/ INTEROTOMY REPAIR;  Surgeon: Adin Hector, MD;  Location: [facility] ORS;  Service: General;  Laterality: N/A;  . LAPAROTOMY N/A 11/27/2012   Procedure: EXPLORATORY LAPAROTOMY SIGMOID COLECTOMY, COLOSTOMY;  Surgeon: Madilyn Hook, DO;  Location: [facility] ORS;  Service: General;  Laterality: N/A;  . RADIOLOGY WITH ANESTHESIA Left 10/23/2017   Procedure: CT RENAL CRYO AND BIOPSY;  Surgeon: Aletta Edouard, MD;  Location: [facility] ORS;  Service: Radiology;  Laterality: Left;  . TOTAL KNEE ARTHROPLASTY Bilateral 6/08  Current Medications: Current Meds  Medication Sig  . Abatacept (ORENCIA) 125 MG/ML SOSY Inject 125 mg into the skin every Monday.   Marland Kitchen albuterol (VENTOLIN HFA) 108 (90 Base) MCG/ACT inhaler Inhale 1 puff into the lungs every 4 (four) hours as needed for wheezing or shortness of breath.  . allopurinol (ZYLOPRIM) 100 MG tablet TAKE 1 TABLET(100 MG) BY MOUTH DAILY  . amLODipine (NORVASC) 10 MG tablet TAKE 1 TABLET(10 MG) BY MOUTH DAILY  . aspirin EC 81 MG tablet Take 1 tablet (81 mg total) by mouth daily.  Marland Kitchen CALCIUM PO Take 1 tablet by mouth 2 (two) times daily.  . cetirizine (ZYRTEC) 10 MG tablet Take 1 tablet  (10 mg total) by mouth daily.  . Cholecalciferol (VITAMIN D) 2000 units CAPS Take 2,000 Units by mouth daily.  Marland Kitchen CIPRODEX OTIC suspension INSTILL 4 DROPS UNTO EACH EAR TWICE DAILY AS NEEDED  . denosumab (PROLIA) 60 MG/ML SOSY injection Inject 60 mg into the skin every 6 (six) months.  . diclofenac sodium (VOLTAREN) 1 % GEL Apply 1 application topically 4 (four) times daily as needed (arthritis).  Marland Kitchen docusate sodium (COLACE) 100 MG capsule Take 100 mg by mouth 2 (two) times daily.  Marland Kitchen esomeprazole (NEXIUM) 40 MG capsule TAKE 1 CAPSULE(40 MG) BY MOUTH DAILY  . folic acid (FOLVITE) 295 MCG tablet Take 400 mcg by mouth daily.  Marland Kitchen HYDROcodone-acetaminophen (NORCO) 10-325 MG tablet Take 1 tablet by mouth every 6 (six) hours as needed for moderate pain.  Marland Kitchen ketoconazole (NIZORAL) 2 % cream Apply 1 application topically daily as needed for irritation.  . megestrol (MEGACE ES) 625 MG/5ML suspension take 5 milliliters by mouth three times daily before meals as needed for appetite  . Methotrexate Sodium (METHOTREXATE, PF,) 50 MG/2ML injection Inject 0.5 mLs into the muscle every Monday.  . metoCLOPramide (REGLAN) 10 MG tablet Take 1 tablet (10 mg total) by mouth every 6 (six) hours.  . Multiple Vitamin (MULTIVITAMIN WITH MINERALS) TABS tablet Take 1 tablet by mouth daily.  . polyethylene glycol (MIRALAX / GLYCOLAX) 17 g packet Take 17 g by mouth daily as needed for mild constipation.  Marland Kitchen tiZANidine (ZANAFLEX) 4 MG tablet Take 1 tablet (4 mg total) by mouth 2 (two) times daily.  . traMADol (ULTRAM) 50 MG tablet TAKE 1 TO 2 TABLETS BY MOUTH EVERY 6 HOURS AS NEEDED  . triamcinolone (KENALOG) 0.025 % cream Apply 1 application topically daily as needed (itching).  . vitamin B-12 (CYANOCOBALAMIN) 500 MCG tablet Take 500 mcg by mouth daily.  . vitamin C (ASCORBIC ACID) 500 MG tablet Take 500 mg by mouth daily.      Allergies:   Spinach   Social History   Socioeconomic History  . Marital status: Single    Spouse  name: Not on file  . Number of children: 1  . Years of education: Not on file  . Highest education level: Not on file  Occupational History  . Not on file  Tobacco Use  . Smoking status: Former Smoker    Packs/day: 1.00    Types: Cigarettes    Quit date: 11/27/2012    Years since quitting: 7.3  . Smokeless tobacco: Never Used  Vaping Use  . Vaping Use: Former  Substance and Sexual Activity  . Alcohol use: No    Alcohol/week: 0.0 standard drinks    Comment: recovering alcoholic sober since 6213  . Drug use: No  . Sexual activity: Not Currently    Partners: Male  Birth control/protection: None  Other Topics Concern  . Not on file  Social History Narrative  . Not on file   Social Determinants of Health   Financial Resource Strain: Low Risk   . Difficulty of Paying Living Expenses: Not hard at all  Food Insecurity:   . Worried About Charity fundraiser in the Last Year: Not on file  . Ran Out of Food in the Last Year: Not on file  Transportation Needs: No Transportation Needs  . Lack of Transportation (Medical): No  . Lack of Transportation (Non-Medical): No  Physical Activity:   . Days of Exercise per Week: Not on file  . Minutes of Exercise per Session: Not on file  Stress:   . Feeling of Stress : Not on file  Social Connections:   . Frequency of Communication with Friends and Family: Not on file  . Frequency of Social Gatherings with Friends and Family: Not on file  . Attends Religious Services: Not on file  . Active Member of Clubs or Organizations: Not on file  . Attends Archivist Meetings: Not on file  . Marital Status: Not on file     Family History: The patient's family history is negative for Heart attack, Colon cancer, and Osteoporosis.  ROS:   Please see the history of present illness.    Appetite is stable.  Compliant with medications.  There is no medication side effect that she is aware of.  She is close to having polypharmacy with greater  than 20 medications on the med list.  These include hydrocodone, methotrexate, Reglan, and Prolia all other systems reviewed and are negative.  EKGs/Labs/Other Studies Reviewed:    The following studies were reviewed today:  2D Doppler echocardiogram 2020: IMPRESSIONS    1. Left ventricular ejection fraction, by visual estimation, is 60 to  65%. The left ventricle has normal function. Normal left ventricular size.  There is no left ventricular hypertrophy.  2. Left ventricular diastolic Doppler parameters are consistent with  impaired relaxation pattern of LV diastolic filling.  3. Global right ventricle has normal systolic function.The right  ventricular size is normal. No increase in right ventricular wall  thickness.  4. Left atrial size was normal.  5. Right atrial size was normal.  6. The mitral valve is normal in structure. Mild mitral valve  regurgitation. No evidence of mitral stenosis.  7. The tricuspid valve is normal in structure. Tricuspid valve  regurgitation moderate.  8. The aortic valve is normal in structure. Aortic valve regurgitation  was not visualized by color flow Doppler. Structurally normal aortic  valve, with no evidence of sclerosis or stenosis.  9. The pulmonic valve was normal in structure. Pulmonic valve  regurgitation is not visualized by color flow Doppler.  10. Mildly elevated pulmonary artery systolic pressure.  11. The inferior vena cava is normal in size with greater than 50%  respiratory variability, suggesting right atrial pressure of 3 mmHg.   EKG:  EKG normal sinus rhythm, first-degree AV block, prominent voltage, otherwise normal.  Recent Labs: 04/20/2019: BUN 10; Creatinine, Ser 0.67; Potassium 4.2; Sodium 142 12/21/2019: TSH 1.31  Recent Lipid Panel    Component Value Date/Time   CHOL 142 12/02/2012 0356   TRIG 132 01/06/2013 0512   HDL 46 12/02/2012 0356   CHOLHDL 3.1 12/02/2012 0356   VLDL 22 12/02/2012 0356   LDLCALC  74 12/02/2012 0356    Physical Exam:    VS:  BP 118/68   Pulse  76   Ht 5\' 8"  (1.727 m)   LMP 06/25/2006 (LMP Unknown)   SpO2 96%   BMI 22.05 kg/m     Wt Readings from Last 3 Encounters:  10/19/19 145 lb (65.8 kg)  09/29/19 150 lb (68 kg)  03/04/19 150 lb (68 kg)     GEN: Apical scoliosis, sitting in wheelchair with slight lean to the left. No acute distress HEENT: Enucleated right eye.  Blind and does not focus with left eye. NECK: No JVD. LYMPHATICS: No lymphadenopathy CARDIAC:  RRR without murmur, gallop, or edema. VASCULAR:  Normal Pulses. No bruits. RESPIRATORY:  Clear to auscultation without rales, wheezing or rhonchi  ABDOMEN: Soft, non-tender, non-distended, No pulsatile mass, MUSCULOSKELETAL: Significant kyphoscoliosis; ulnar deviation and contracture of fingers both hands. SKIN: Warm and dry NEUROLOGIC:  Alert and oriented x 3 PSYCHIATRIC:  Normal affect   ASSESSMENT:    1. Chronic combined systolic and diastolic heart failure ([place])   2. Marfan's syndrome   3. Typical atrial flutter ([place])   4. Essential hypertension   5. Pulmonary HTN ([place])   6. Educated about COVID-19 virus infection   7. Aortic dilatation ([facility])    PLAN:    In order of problems listed above:  1. Echocardiogram done in 2020 demonstrates persistent resolution of systolic dysfunction.  EF was 60 to 65%. 2. Because of peripheral stigmata, it has been suspected that she has a Marfan's-like syndrome.  The aortic root is normal in size. 3. No evidence of atrial flutter on today's EKG.  She does have first-degree AV block. 4. Excellent blood pressure control at 118/68 mmHg. 5. No clinical evidence of pulmonary hypertension on exam, and an estimated RV systolic pressure of 31 mmHg last year on echocardiography. 6. She has not been vaccinated. 7. 2D Doppler echocardiogram will be repeated in 1 year to compare with the 2020 study which did not reveal any evidence of aortic enlargement.   Theoretically, beta-blocker (contraindicated due to significant first-degree AV block) and ARB therapy may be more helpful in protecting the aorta other than amlodipine.  Will consider in the future especially if we start to see any evidence of aortic enlargement.   Medication Adjustments/Labs and Tests Ordered: Current medicines are reviewed at length with the patient today.  Concerns regarding medicines are outlined above.  Orders Placed This Encounter  Procedures  . EKG 12-Lead  . ECHOCARDIOGRAM COMPLETE   No orders of the defined types were placed in this encounter.   Patient Instructions  Medication Instructions:  Your physician recommends that you continue on your current medications as directed. Please refer to the Current Medication list given to you today.  *If you need a refill on your cardiac medications before your next appointment, please call your pharmacy*   Lab Work: None If you have labs (blood work) drawn today and your tests are completely normal, you will receive your results only by: Marland Kitchen MyChart Message (if you have MyChart) OR . A paper copy in the mail If you have any lab test that is abnormal or we need to change your treatment, we will call you to review the results.   Testing/Procedures: Your physician has requested that you have an echocardiogram 1-2 weeks prior to seeing Dr. Tamala Julian back in one year. Echocardiography is a painless test that uses sound waves to create images of your heart. It provides your doctor with information about the size and shape of your heart and how well your heart's chambers and valves are working.  This procedure takes approximately one hour. There are no restrictions for this procedure.     Follow-Up: At [facility], you and your health needs are our priority.  As part of our continuing mission to provide you with exceptional heart care, we have created designated Provider Care Teams.  These Care Teams include your primary  Cardiologist (physician) and Advanced Practice Providers (APPs -  Physician Assistants and Nurse Practitioners) who all work together to provide you with the care you need, when you need it.  We recommend signing up for the patient portal called "MyChart".  Sign up information is provided on this After Visit Summary.  MyChart is used to connect with patients for Virtual Visits (Telemedicine).  Patients are able to view lab/test results, encounter notes, upcoming appointments, etc.  Non-urgent messages can be sent to your provider as well.   To learn more about what you can do with MyChart, go to [email or web address].    Your next appointment:   12 month(s)  The format for your next appointment:   In Person  Provider:   You may see Sinclair Grooms, MD or one of the following Advanced Practice Providers on your designated Care Team:    Truitt Merle, NP  Cecilie Kicks, NP  Kathyrn Drown, NP    Other Instructions      Signed, Sinclair Grooms, MD  04/04/2020 12:24 PM    [place]

## 2020-04-04 ENCOUNTER — Other Ambulatory Visit: Payer: Self-pay

## 2020-04-04 ENCOUNTER — Encounter: Payer: Self-pay | Admitting: Interventional Cardiology

## 2020-04-04 ENCOUNTER — Ambulatory Visit (INDEPENDENT_AMBULATORY_CARE_PROVIDER_SITE_OTHER): Payer: Medicare Other | Admitting: Interventional Cardiology

## 2020-04-04 VITALS — BP 118/68 | HR 76 | Ht 68.0 in

## 2020-04-04 DIAGNOSIS — I77819 Aortic ectasia, unspecified site: Secondary | ICD-10-CM

## 2020-04-04 DIAGNOSIS — I272 Pulmonary hypertension, unspecified: Secondary | ICD-10-CM | POA: Diagnosis not present

## 2020-04-04 DIAGNOSIS — Q8740 Marfan's syndrome, unspecified: Secondary | ICD-10-CM

## 2020-04-04 DIAGNOSIS — I5042 Chronic combined systolic (congestive) and diastolic (congestive) heart failure: Secondary | ICD-10-CM | POA: Diagnosis not present

## 2020-04-04 DIAGNOSIS — I10 Essential (primary) hypertension: Secondary | ICD-10-CM | POA: Diagnosis not present

## 2020-04-04 DIAGNOSIS — I483 Typical atrial flutter: Secondary | ICD-10-CM

## 2020-04-04 DIAGNOSIS — Z7189 Other specified counseling: Secondary | ICD-10-CM

## 2020-04-04 NOTE — Patient Instructions (Signed)
Medication Instructions:  Your physician recommends that you continue on your current medications as directed. Please refer to the Current Medication list given to you today.  *If you need a refill on your cardiac medications before your next appointment, please call your pharmacy*   Lab Work: None If you have labs (blood work) drawn today and your tests are completely normal, you will receive your results only by: Marland Kitchen MyChart Message (if you have MyChart) OR . A paper copy in the mail If you have any lab test that is abnormal or we need to change your treatment, we will call you to review the results.   Testing/Procedures: Your physician has requested that you have an echocardiogram 1-2 weeks prior to seeing Dr. Tamala Julian back in one year. Echocardiography is a painless test that uses sound waves to create images of your heart. It provides your doctor with information about the size and shape of your heart and how well your heart's chambers and valves are working. This procedure takes approximately one hour. There are no restrictions for this procedure.     Follow-Up: At [facility], you and your health needs are our priority.  As part of our continuing mission to provide you with exceptional heart care, we have created designated Provider Care Teams.  These Care Teams include your primary Cardiologist (physician) and Advanced Practice Providers (APPs -  Physician Assistants and Nurse Practitioners) who all work together to provide you with the care you need, when you need it.  We recommend signing up for the patient portal called "MyChart".  Sign up information is provided on this After Visit Summary.  MyChart is used to connect with patients for Virtual Visits (Telemedicine).  Patients are able to view lab/test results, encounter notes, upcoming appointments, etc.  Non-urgent messages can be sent to your provider as well.   To learn more about what you can do with MyChart, go to  [email or web address].    Your next appointment:   12 month(s)  The format for your next appointment:   In Person  Provider:   You may see Sinclair Grooms, MD or one of the following Advanced Practice Providers on your designated Care Team:    Truitt Merle, NP  Cecilie Kicks, NP  Kathyrn Drown, NP    Other Instructions

## 2020-04-07 ENCOUNTER — Other Ambulatory Visit: Payer: Self-pay | Admitting: Family Medicine

## 2020-04-12 DIAGNOSIS — M0549 Rheumatoid myopathy with rheumatoid arthritis of multiple sites: Secondary | ICD-10-CM | POA: Diagnosis not present

## 2020-04-20 ENCOUNTER — Encounter: Payer: Self-pay | Admitting: Family Medicine

## 2020-04-20 ENCOUNTER — Telehealth (INDEPENDENT_AMBULATORY_CARE_PROVIDER_SITE_OTHER): Payer: Medicare Other | Admitting: Family Medicine

## 2020-04-20 VITALS — Temp 98.5°F

## 2020-04-20 DIAGNOSIS — M069 Rheumatoid arthritis, unspecified: Secondary | ICD-10-CM | POA: Diagnosis not present

## 2020-04-20 DIAGNOSIS — Z933 Colostomy status: Secondary | ICD-10-CM | POA: Diagnosis not present

## 2020-04-20 DIAGNOSIS — F1190 Opioid use, unspecified, uncomplicated: Secondary | ICD-10-CM | POA: Diagnosis not present

## 2020-04-20 MED ORDER — HYDROCODONE-ACETAMINOPHEN 10-325 MG PO TABS: 1.0000 | ORAL_TABLET | Freq: Four times a day (QID) | ORAL | 0 refills | Status: DC | PRN

## 2020-04-20 MED ORDER — HYDROCODONE-ACETAMINOPHEN 10-325 MG PO TABS: 1.0000 | ORAL_TABLET | Freq: Four times a day (QID) | ORAL | 0 refills | Status: AC | PRN

## 2020-04-20 NOTE — Progress Notes (Signed)
   Subjective:    Patient ID: [name], female    DOB: 12-07-1955, 64 y.o.   MRN: [id number]  HPI Virtual Visit via Telephone Note  I connected with the patient on 04/20/20 at  2:00 PM EDT by telephone and verified that I am speaking with the correct person using two identifiers.   I discussed the limitations, risks, security and privacy concerns of performing an evaluation and management service by telephone and the availability of in person appointments. I also discussed with the patient that there may be a patient responsible charge related to this service. The patient expressed understanding and agreed to proceed.  Location patient: home Location provider: work or home office Participants present for the call: patient, provider Patient did not have a visit in the prior 7 days to address this/these issue(s).   History of Present Illness: Here for pain management, she is doing well.  Indication for chronic opioid: rheumatoid arthritis Medication and dose: Norco 10-325 # pills per month: 120 Last UDS date: 07-21-18 Opioid Treatment Agreement signed (Y/N): 07-12-17 Opioid Treatment Agreement last reviewed with patient:  04-20-20 N[facility]SRS reviewed this encounter (include red flags): Yes    Observations/Objective: Patient sounds cheerful and well on the phone. I do not appreciate any SOB. Speech and thought processing are grossly intact. Patient reported vitals:  Assessment and Plan: Pain management, meds were refilled.  [name], MD   Follow Up Instructions:     [phone] [phone] 11-20 [id number] 21-30 I did not refer this patient for an OV in the next 24 hours for this/these issue(s).  I discussed the assessment and treatment plan with the patient. The patient was provided an opportunity to ask questions and all were answered. The patient agreed with the plan and demonstrated an understanding of the instructions.   The patient was advised to call back or seek an  in-person evaluation if the symptoms worsen or if the condition fails to improve as anticipated.  I provided 10 minutes of non-face-to-face time during this encounter.   [name], MD    Review of Systems     Objective:   Physical Exam        Assessment & Plan:

## 2020-05-07 ENCOUNTER — Other Ambulatory Visit: Payer: Self-pay

## 2020-05-07 ENCOUNTER — Inpatient Hospital Stay (HOSPITAL_COMMUNITY)
Admission: EM | Admit: 2020-05-07 | Discharge: 2020-05-12 | DRG: 389 | Disposition: A | Discharge status: Home Health | Payer: Medicare Other | Attending: Internal Medicine | Admitting: Internal Medicine

## 2020-05-07 ENCOUNTER — Encounter (HOSPITAL_COMMUNITY): Payer: Self-pay

## 2020-05-07 ENCOUNTER — Other Ambulatory Visit: Payer: Self-pay | Admitting: Family Medicine

## 2020-05-07 ENCOUNTER — Emergency Department (HOSPITAL_COMMUNITY): Payer: Medicare Other

## 2020-05-07 DIAGNOSIS — Z9049 Acquired absence of other specified parts of digestive tract: Secondary | ICD-10-CM

## 2020-05-07 DIAGNOSIS — M1A9XX0 Chronic gout, unspecified, without tophus (tophi): Secondary | ICD-10-CM | POA: Diagnosis present

## 2020-05-07 DIAGNOSIS — H547 Unspecified visual loss: Secondary | ICD-10-CM

## 2020-05-07 DIAGNOSIS — Z933 Colostomy status: Secondary | ICD-10-CM

## 2020-05-07 DIAGNOSIS — I483 Typical atrial flutter: Secondary | ICD-10-CM

## 2020-05-07 DIAGNOSIS — M069 Rheumatoid arthritis, unspecified: Secondary | ICD-10-CM | POA: Diagnosis present

## 2020-05-07 DIAGNOSIS — R109 Unspecified abdominal pain: Secondary | ICD-10-CM | POA: Diagnosis not present

## 2020-05-07 DIAGNOSIS — Z79899 Other long term (current) drug therapy: Secondary | ICD-10-CM | POA: Diagnosis not present

## 2020-05-07 DIAGNOSIS — E440 Moderate protein-calorie malnutrition: Secondary | ICD-10-CM | POA: Insufficient documentation

## 2020-05-07 DIAGNOSIS — I4892 Unspecified atrial flutter: Secondary | ICD-10-CM | POA: Diagnosis present

## 2020-05-07 DIAGNOSIS — K56609 Unspecified intestinal obstruction, unspecified as to partial versus complete obstruction: Secondary | ICD-10-CM

## 2020-05-07 DIAGNOSIS — I491 Atrial premature depolarization: Secondary | ICD-10-CM | POA: Diagnosis not present

## 2020-05-07 DIAGNOSIS — I499 Cardiac arrhythmia, unspecified: Secondary | ICD-10-CM | POA: Diagnosis not present

## 2020-05-07 DIAGNOSIS — I440 Atrioventricular block, first degree: Secondary | ICD-10-CM | POA: Diagnosis not present

## 2020-05-07 DIAGNOSIS — K5669 Other partial intestinal obstruction: Secondary | ICD-10-CM | POA: Diagnosis not present

## 2020-05-07 DIAGNOSIS — Q8740 Marfan's syndrome, unspecified: Secondary | ICD-10-CM

## 2020-05-07 DIAGNOSIS — R1084 Generalized abdominal pain: Secondary | ICD-10-CM | POA: Diagnosis not present

## 2020-05-07 DIAGNOSIS — H5440 Blindness, one eye, unspecified eye: Secondary | ICD-10-CM | POA: Diagnosis not present

## 2020-05-07 DIAGNOSIS — Z8673 Personal history of transient ischemic attack (TIA), and cerebral infarction without residual deficits: Secondary | ICD-10-CM

## 2020-05-07 DIAGNOSIS — Z20822 Contact with and (suspected) exposure to covid-19: Secondary | ICD-10-CM | POA: Diagnosis present

## 2020-05-07 DIAGNOSIS — I110 Hypertensive heart disease with heart failure: Secondary | ICD-10-CM | POA: Diagnosis present

## 2020-05-07 DIAGNOSIS — K565 Intestinal adhesions [bands], unspecified as to partial versus complete obstruction: Secondary | ICD-10-CM | POA: Diagnosis not present

## 2020-05-07 DIAGNOSIS — I5032 Chronic diastolic (congestive) heart failure: Secondary | ICD-10-CM | POA: Diagnosis present

## 2020-05-07 DIAGNOSIS — K6389 Other specified diseases of intestine: Secondary | ICD-10-CM | POA: Diagnosis not present

## 2020-05-07 DIAGNOSIS — J811 Chronic pulmonary edema: Secondary | ICD-10-CM | POA: Diagnosis not present

## 2020-05-07 DIAGNOSIS — K432 Incisional hernia without obstruction or gangrene: Secondary | ICD-10-CM | POA: Diagnosis present

## 2020-05-07 DIAGNOSIS — K5939 Other megacolon: Secondary | ICD-10-CM | POA: Diagnosis not present

## 2020-05-07 DIAGNOSIS — R059 Cough, unspecified: Secondary | ICD-10-CM

## 2020-05-07 DIAGNOSIS — M47816 Spondylosis without myelopathy or radiculopathy, lumbar region: Secondary | ICD-10-CM | POA: Diagnosis not present

## 2020-05-07 DIAGNOSIS — I5042 Chronic combined systolic (congestive) and diastolic (congestive) heart failure: Secondary | ICD-10-CM | POA: Diagnosis present

## 2020-05-07 DIAGNOSIS — Z6820 Body mass index (BMI) 20.0-20.9, adult: Secondary | ICD-10-CM

## 2020-05-07 DIAGNOSIS — R000 Tachycardia, unspecified: Secondary | ICD-10-CM | POA: Diagnosis not present

## 2020-05-07 DIAGNOSIS — K5909 Other constipation: Secondary | ICD-10-CM | POA: Diagnosis present

## 2020-05-07 DIAGNOSIS — D72829 Elevated white blood cell count, unspecified: Secondary | ICD-10-CM | POA: Diagnosis present

## 2020-05-07 DIAGNOSIS — K219 Gastro-esophageal reflux disease without esophagitis: Secondary | ICD-10-CM | POA: Diagnosis not present

## 2020-05-07 DIAGNOSIS — H548 Legal blindness, as defined in USA: Secondary | ICD-10-CM | POA: Diagnosis not present

## 2020-05-07 DIAGNOSIS — R1110 Vomiting, unspecified: Secondary | ICD-10-CM | POA: Diagnosis not present

## 2020-05-07 DIAGNOSIS — I10 Essential (primary) hypertension: Secondary | ICD-10-CM | POA: Diagnosis present

## 2020-05-07 DIAGNOSIS — J90 Pleural effusion, not elsewhere classified: Secondary | ICD-10-CM | POA: Diagnosis not present

## 2020-05-07 DIAGNOSIS — Z87891 Personal history of nicotine dependence: Secondary | ICD-10-CM

## 2020-05-07 DIAGNOSIS — Z85528 Personal history of other malignant neoplasm of kidney: Secondary | ICD-10-CM

## 2020-05-07 DIAGNOSIS — M4186 Other forms of scoliosis, lumbar region: Secondary | ICD-10-CM | POA: Diagnosis not present

## 2020-05-07 DIAGNOSIS — Z96653 Presence of artificial knee joint, bilateral: Secondary | ICD-10-CM | POA: Diagnosis present

## 2020-05-07 DIAGNOSIS — Z743 Need for continuous supervision: Secondary | ICD-10-CM | POA: Diagnosis not present

## 2020-05-07 DIAGNOSIS — R112 Nausea with vomiting, unspecified: Secondary | ICD-10-CM | POA: Diagnosis not present

## 2020-05-07 HISTORY — DX: Heart failure, unspecified: I50.9

## 2020-05-07 LAB — COMPREHENSIVE METABOLIC PANEL
ALT: 21 U/L (ref 0–44)
AST: 31 U/L (ref 15–41)
Albumin: 5 g/dL (ref 3.5–5.0)
Alkaline Phosphatase: 85 U/L (ref 38–126)
Anion gap: 14 (ref 5–15)
BUN: 21 mg/dL (ref 8–23)
CO2: 26 mmol/L (ref 22–32)
Calcium: 10 mg/dL (ref 8.9–10.3)
Chloride: 99 mmol/L (ref 98–111)
Creatinine, Ser: 0.97 mg/dL (ref 0.44–1.00)
GFR, Estimated: >60 mL/min (ref >60)
Glucose, Bld: 180 mg/dL — ABNORMAL HIGH (ref 70–99)
Potassium: 4.3 mmol/L (ref 3.5–5.1)
Sodium: 139 mmol/L (ref 135–145)
Total Bilirubin: 0.9 mg/dL (ref 0.3–1.2)
Total Protein: 9 g/dL — ABNORMAL HIGH (ref 6.5–8.1)

## 2020-05-07 LAB — URINALYSIS, ROUTINE W REFLEX MICROSCOPIC
Bacteria, UA: NONE SEEN
Bilirubin Urine: NEGATIVE
Glucose, UA: NEGATIVE mg/dL
Hgb urine dipstick: NEGATIVE
Ketones, ur: 5 mg/dL — AB
Leukocytes,Ua: NEGATIVE
Nitrite: NEGATIVE
Protein, ur: 30 mg/dL — AB
Specific Gravity, Urine: 1.018 (ref 1.005–1.030)
pH: 5 (ref 5.0–8.0)

## 2020-05-07 LAB — CBC
HCT: 47.1 % — ABNORMAL HIGH (ref 36.0–46.0)
Hemoglobin: 15.3 g/dL — ABNORMAL HIGH (ref 12.0–15.0)
MCH: 31.4 pg (ref 26.0–34.0)
MCHC: 32.5 g/dL (ref 30.0–36.0)
MCV: 96.5 fL (ref 80.0–100.0)
Platelets: 347 10*3/uL (ref 150–400)
RBC: 4.88 MIL/uL (ref 3.87–5.11)
RDW: 13.6 % (ref 11.5–15.5)
WBC: 14.5 10*3/uL — ABNORMAL HIGH (ref 4.0–10.5)
nRBC: 0 % (ref 0.0–0.2)

## 2020-05-07 LAB — RESPIRATORY PANEL BY RT PCR (FLU A&B, COVID)
Influenza A by PCR: NEGATIVE
Influenza B by PCR: NEGATIVE
SARS Coronavirus 2 by RT PCR: NEGATIVE

## 2020-05-07 LAB — LIPASE, BLOOD: Lipase: 42 U/L (ref 11–51)

## 2020-05-07 LAB — HIV ANTIBODY (ROUTINE TESTING W REFLEX): HIV Screen 4th Generation wRfx: NONREACTIVE

## 2020-05-07 LAB — TROPONIN I (HIGH SENSITIVITY)
Troponin I (High Sensitivity): 6 ng/L (ref <18)
Troponin I (High Sensitivity): 9 ng/L (ref <18)

## 2020-05-07 LAB — MAGNESIUM: Magnesium: 1.9 mg/dL (ref 1.7–2.4)

## 2020-05-07 LAB — PHOSPHORUS: Phosphorus: 3.3 mg/dL (ref 2.5–4.6)

## 2020-05-07 MED ORDER — IOHEXOL 300 MG/ML  SOLN
100.0000 mL | Freq: Once | INTRAMUSCULAR | Status: AC | PRN
  Administered 2020-05-07: 100 mL via INTRAVENOUS

## 2020-05-07 MED ORDER — SODIUM CHLORIDE 0.9 % IV SOLN: INTRAVENOUS | Status: AC

## 2020-05-07 MED ORDER — MORPHINE SULFATE (PF) 2 MG/ML IV SOLN
2.0000 mg | INTRAVENOUS | Status: DC | PRN
  Administered 2020-05-07 – 2020-05-08 (×2): 2 mg via INTRAVENOUS
  Filled 2020-05-07 (×2): qty 1

## 2020-05-07 MED ORDER — ONDANSETRON HCL 4 MG PO TABS: 4.0000 mg | ORAL_TABLET | Freq: Four times a day (QID) | ORAL | Status: DC | PRN

## 2020-05-07 MED ORDER — SODIUM CHLORIDE 0.9% FLUSH
3.0000 mL | Freq: Two times a day (BID) | INTRAVENOUS | Status: DC
  Administered 2020-05-07 – 2020-05-12 (×7): 3 mL via INTRAVENOUS

## 2020-05-07 MED ORDER — ONDANSETRON HCL 4 MG/2ML IJ SOLN
4.0000 mg | Freq: Four times a day (QID) | INTRAMUSCULAR | Status: DC | PRN
  Administered 2020-05-07 – 2020-05-08 (×2): 4 mg via INTRAVENOUS
  Filled 2020-05-07 (×2): qty 2

## 2020-05-07 MED ORDER — PANTOPRAZOLE SODIUM 40 MG IV SOLR
40.0000 mg | Freq: Every day | INTRAVENOUS | Status: DC
  Administered 2020-05-07 – 2020-05-09 (×3): 40 mg via INTRAVENOUS
  Filled 2020-05-07 (×3): qty 40

## 2020-05-07 MED ORDER — ENOXAPARIN SODIUM 40 MG/0.4ML ~~LOC~~ SOLN
40.0000 mg | SUBCUTANEOUS | Status: DC
  Administered 2020-05-07 – 2020-05-11 (×5): 40 mg via SUBCUTANEOUS
  Filled 2020-05-07 (×5): qty 0.4

## 2020-05-07 NOTE — H&P (Signed)
History and Physical        Hospital Admission Note Date: 05/07/2020  Patient name: [name] Medical record number: [id number] Date of birth: 07/20/1955 Age: 64 y.o. Gender: female  PCP: [name], MD  Patient coming from: Home Lives with: alone At baseline, ambulates: Wheelchair or walker  Chief Complaint    Chief Complaint  Patient presents with  . Nausea  . Emesis      HPI:   This is a 64 year old female with a history of atrial flutter, diverticulitis with perforation s/p colectomy/ostomy 2014, marfan's syndrome, RA, blind, papillary renal cell CA, SBO who presented to the ED with abdominal pain since last night with associated nausea and vomiting. No change in output from her colostomy.  States this is similar to prior bowel obstructions but the last few she has waited out at home until it resolved.  Last night her pain became so severe and she was unable to keep any of her medications down which prompted her to come to the ED.  Denies any fever, chills, chest pain, shortness of breath.  Has improved symptoms currently.  Able to pass gas, still with stool output in colostomy.  ED Course: Afebrile, tachycardic and hemodynamically stable on room air. Notable Labs: glucose 180, WBC 14.5, Hb 15.3, platelets 347. CT abdomen pelvis w contrast: mild SBO likely due to adhesion or internal hernia, large wide mouthed suprapubic entral hernia containing multiple small bowel loops and transverse colon without evidence of strangulation.  Vitals:   05/07/20 1230 05/07/20 1315  BP:  135/80  Pulse: (!) 54 (!) 55  Resp: 18 18  Temp:    SpO2: 96% 95%     Review of Systems:  Review of Systems  All other systems reviewed and are negative.   Medical/Social/Family History   Past Medical History: Past Medical History:  Diagnosis Date  . Atrial flutter ([place])   . CHF  (congestive heart failure) ([place])   . Chronic gout 2008  . Diverticulitis of large intestine with perforation   . Dysrhythmia   . Fibroid   . Hearing aid worn    pt wears bilateral hearing aids  . Hernia 4/08  . Hx of adenomatous polyp of colon 11/13/2016  . Hypertension   . Legally blind    since pt was a teenager  . Marfan's syndrome affecting skin    with scolosis  . Osteoporosis   . 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 Rheumatoid arthritis [facility])    sees Dr. Gavin Pound   . SBO (small bowel obstruction) ([place]) 01/05/2013  . Small bowel obstruction due to adhesions ([place])   . Status post bunionectomy 1/10   bilateral   . Stroke ([place]) 2/08  . Substance abuse ([place])    recovering alcoholic P32 years   . 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 ABLATION  01/04/14   atrial flutter ablation (2 circuits) by Dr Lovena Le  . ATRIAL FLUTTER ABLATION N/A 01/04/2014   Procedure: ATRIAL FLUTTER ABLATION;  Surgeon: Evans Lance, MD;  Location[facility] C[facility] LAB;  Service: Cardiovascular;  Laterality: N/A;  .  BUNIONECTOMY Bilateral 1/10  . COLON SURGERY     colectomy  . COLOSTOMY  11/27/2012  . HERNIA REPAIR    . IR RADIOLOGIST EVAL & MGMT  10/01/2017  . IR RADIOLOGIST EVAL & MGMT  11/21/2017  . IR RADIOLOGIST EVAL & MGMT  02/13/2018  . IR RADIOLOGIST EVAL & MGMT  01/08/2019  . IR RADIOLOGIST EVAL & MGMT  12/29/2019  . LAPAROSCOPIC ABDOMINAL EXPLORATION N/A 01/06/2013   Procedure: LAPAROSCOPIC ABDOMINAL EXPLORATION;  Surgeon: Adin Hector, MD;  Location: [facility] ORS;  Service: General;  Laterality: N/A;  . LAPAROSCOPIC LYSIS OF ADHESIONS N/A 01/06/2013   Procedure: LAPAROSCOPIC LYSIS OF ADHESIONS/ INTEROTOMY REPAIR;  Surgeon: Adin Hector, MD;  Location: [facility] ORS;  Service: General;  Laterality: N/A;  . LAPAROTOMY N/A 11/27/2012   Procedure: EXPLORATORY LAPAROTOMY SIGMOID COLECTOMY, COLOSTOMY;  Surgeon: Madilyn Hook, DO;  Location: [facility] ORS;   Service: General;  Laterality: N/A;  . RADIOLOGY WITH ANESTHESIA Left 10/23/2017   Procedure: CT RENAL CRYO AND BIOPSY;  Surgeon: Aletta Edouard, MD;  Location: [facility] ORS;  Service: Radiology;  Laterality: Left;  . TOTAL KNEE ARTHROPLASTY Bilateral 6/08    Medications: Prior to Admission medications   Medication Sig Start Date End Date Taking? Authorizing Provider  Abatacept (ORENCIA) 125 MG/ML SOSY Inject 125 mg into the skin every Monday.     [provider]  albuterol (VENTOLIN HFA) 108 (90 Base) MCG/ACT inhaler Inhale 1 puff into the lungs every 4 (four) hours as needed for wheezing or shortness of breath. 10/19/19   [name], MD  allopurinol (ZYLOPRIM) 100 MG tablet TAKE 1 TABLET(100 MG) BY MOUTH DAILY (SCHEDULE PHYSICAL FOR ADDITIONAL REFILLS) 04/07/20   [name], MD  amLODipine (NORVASC) 10 MG tablet TAKE 1 TABLET(10 MG) BY MOUTH DAILY (SCHEDULE PHYSICAL FOR ADDITIONAL REFILLS) 04/07/20   [name], MD  aspirin EC 81 MG tablet Take 1 tablet (81 mg total) by mouth daily. 05/11/14   Verlee Monte, MD  CALCIUM PO Take 1 tablet by mouth 2 (two) times daily.    [provider]  cetirizine (ZYRTEC) 10 MG tablet Take 1 tablet (10 mg total) by mouth daily. 11/17/14   [name], MD  Cholecalciferol (VITAMIN D) 2000 units CAPS Take 2,000 Units by mouth daily.    [provider]  CIPRODEX OTIC suspension INSTILL 4 DROPS UNTO EACH EAR TWICE DAILY AS NEEDED 06/19/18   [name], MD  denosumab (PROLIA) 60 MG/ML SOSY injection Inject 60 mg into the skin every 6 (six) months.    [provider]  diclofenac sodium (VOLTAREN) 1 % GEL Apply 1 application topically 4 (four) times daily as needed (arthritis).    [provider]  docusate sodium (COLACE) 100 MG capsule Take 100 mg by mouth 2 (two) times daily.    [provider]  esomeprazole (NEXIUM) 40 MG capsule TAKE 1 CAPSULE(40 MG) BY MOUTH DAILY 04/30/19   [name], MD   folic acid (FOLVITE) 196 MCG tablet Take 400 mcg by mouth daily.    [provider]  HYDROcodone-acetaminophen (NORCO) 10-325 MG tablet Take 1 tablet by mouth every 6 (six) hours as needed for moderate pain. 06/20/20 07/20/20  [name], MD  ketoconazole (NIZORAL) 2 % cream Apply 1 application topically daily as needed for irritation.    [provider]  megestrol (MEGACE ES) 625 MG/5ML suspension take 5 milliliters by mouth three times daily before meals as needed for appetite 10/08/18  [name], MD  Methotrexate Sodium (METHOTREXATE, PF,) 50 MG/2ML injection Inject 0.5 mLs into the muscle every Monday. 05/14/16   [provider]  metoCLOPramide (REGLAN) 10 MG tablet Take 1 tablet (10 mg total) by mouth every 6 (six) hours. 01/18/20   [name], MD  Multiple Vitamin (MULTIVITAMIN WITH MINERALS) TABS tablet Take 1 tablet by mouth daily.    [provider]  polyethylene glycol (MIRALAX / GLYCOLAX) 17 g packet Take 17 g by mouth daily as needed for mild constipation.    [provider]  tiZANidine (ZANAFLEX) 4 MG tablet Take 1 tablet (4 mg total) by mouth 2 (two) times daily. 11/17/19   [name], MD  traMADol (ULTRAM) 50 MG tablet TAKE 1 TO 2 TABLETS BY MOUTH EVERY 6 HOURS AS NEEDED 02/11/20   [name], MD  triamcinolone (KENALOG) 0.025 % cream Apply 1 application topically daily as needed (itching).    [provider]  vitamin B-12 (CYANOCOBALAMIN) 500 MCG tablet Take 500 mcg by mouth daily.    [provider]  vitamin C (ASCORBIC ACID) 500 MG tablet Take 500 mg by mouth daily.     [provider]    Allergies:   Allergies  Allergen Reactions  . Spinach Itching, Rash and Other (See Comments)    Welts.    Social History:  reports that she quit smoking about 7 years ago. Her smoking use included cigarettes. She smoked 1.00 pack per day. She has never used smokeless tobacco. She reports that she does  not drink alcohol and does not use drugs.  Family History: Family History  Problem Relation Age of Onset  . Heart attack Neg Hx   . Colon cancer Neg Hx   . Osteoporosis Neg Hx      Objective   Physical Exam: Blood pressure 135/80, pulse (!) 55, temperature 98.1 F (36.7 C), temperature source Oral, resp. rate 18, height 5\' 8"  (1.727 m), weight 61.2 kg, last menstrual period 06/25/2006, SpO2 95 %.  Physical Exam Vitals and nursing note reviewed.  Constitutional:      Comments: Chronically ill-appearing  HENT:     Head: Normocephalic.     Mouth/Throat:     Mouth: Mucous membranes are moist.     Comments: Poor dentition Cardiovascular:     Rate and Rhythm: Tachycardia present. Rhythm irregular.  Pulmonary:     Effort: Pulmonary effort is normal. No respiratory distress.  Abdominal:     Comments: Large ventral hernia Colostomy bag with stool output without blood  Musculoskeletal:        General: No swelling or tenderness.     Comments: Chronic bilateral hand changes from RA  Neurological:     Mental Status: She is alert. Mental status is at baseline.  Psychiatric:        Mood and Affect: Mood normal.        Behavior: Behavior normal.     LABS on Admission: I have personally reviewed all the labs and imaging below    Basic Metabolic Panel: Recent Labs  Lab 05/07/20 0647  NA 139  K 4.3  CL 99  CO2 26  GLUCOSE 180*  BUN 21  CREATININE 0.97  CALCIUM 10.0   Liver Function Tests: Recent Labs  Lab 05/07/20 0647  AST 31  ALT 21  ALKPHOS 85  BILITOT 0.9  PROT 9.0*  ALBUMIN 5.0   Recent Labs  Lab 05/07/20 0647  LIPASE 42   No results  for input(s): AMMONIA in the last 168 hours. CBC: Recent Labs  Lab 05/07/20 0647  WBC 14.5*  HGB 15.3*  HCT 47.1*  MCV 96.5  PLT 347   Cardiac Enzymes: No results for input(s): CKTOTAL, CKMB, CKMBINDEX, TROPONINI in the last 168 hours. BNP: Invalid input(s): POCBNP CBG: No results for input(s): GLUCAP in the  last 168 hours.  Radiological Exams on Admission:  CT Abdomen Pelvis W Contrast  Result Date: 05/07/2020 CLINICAL DATA:  Abdominal pain and nausea and vomiting. Underwent colonoscopy yesterday. EXAM: CT ABDOMEN AND PELVIS WITH CONTRAST TECHNIQUE: Multidetector CT imaging of the abdomen and pelvis was performed using the standard protocol following bolus administration of intravenous contrast. CONTRAST:  161mL OMNIPAQUE IOHEXOL 300 MG/ML  SOLN COMPARISON:  09/14/2019 from [facility] FINDINGS: Lower Chest: No acute findings. Hepatobiliary: No hepatic masses identified. Several hepatic cysts again noted. Gallbladder is unremarkable. No evidence of biliary ductal dilatation. Pancreas:  No mass or inflammatory changes. Spleen: Within normal limits in size and appearance. Adrenals/Urinary Tract: Renal parenchymal scarring is again seen involving left kidney greater than right. A subtle low-attenuation lesion is again seen in the anterior interpolar region of the right kidney. This measures 9 mm on image 28/, and is too small to characterize, but shows no significant change compared to prior studies. No evidence of ureteral calculi or hydronephrosis. Stomach/Bowel: Descending colostomy is again seen in the left lower quadrant. Mild colonic diverticulosis is noted, however there is no evidence of diverticulitis. Moderately dilated fluid-filled duodenum and jejunum is seen, with a transition point in the left lower abdomen (image 49/2). No mass or inflammatory process is seen at this site in this may be due to adhesion or internal hernia. A large wide-mouthed midline suprapubic ventral hernia is seen containing multiple small bowel loops and transverse colon, however there is no evidence of strangulation. No evidence of inflammatory process, abscess, or free intraperitoneal air. Vascular/Lymphatic: No pathologically enlarged lymph nodes. No abdominal aortic aneurysm. Aortic atherosclerotic  calcification noted. Reproductive:  No mass or other significant abnormality. Other:  None. Musculoskeletal: No suspicious bone lesions identified. Severe thoracolumbar rotatory levoscoliosis and degenerative changes noted. Severe bilateral hip osteoarthritis also seen. IMPRESSION: Mid small bowel obstruction, with transition point in the left lower abdomen, likely due to adhesion or internal hernia. Large wide-mouthed suprapubic ventral hernia containing multiple small bowel loops and transverse colon. No evidence of strangulation. Colonic diverticulosis, without radiographic evidence of diverticulitis. Aortic Atherosclerosis (ICD10-I70.0). Electronically Signed   By: Marlaine Hind M.D.   On: 05/07/2020 12:08      EKG: Not done   A & P   Principal Problem:   SBO (small bowel obstruction) (HCC) Active Problems:   Blindness   Essential hypertension   Atrial flutter (H[facility])   Chronic combined systolic and diastolic heart failure ([place])   1. SBO, possibly from adhesions in the setting of large ventral hernia and hx of colostomy a. N.p.o., bowel rest b. Gentle IV fluids c. ED MD to discuss with surgery for possible consult   2. Leukocytosis, likely reactive to #1 a. Does not appear to have an active infection, will hold antibiotics for now  3. Abnormal telemetry a. History of A. Fib/flutter s/p ablation in 2015 - bedside tele appears to be the same with HR in low 110s b. Currently off anticoagulation and amiodarone, seemingly since ablation c. Was in sinus rhythm on 04/04/20 d. Per her cardiologist, Dr. Mallie Mussel, beta-blocker contraindicated due to significant first-degree AV block e. Check  EKG. May need a cardiology consult pending EKG findings f. Goal K> 4.0, goal Mg > 2.0  4. Hypertension a. Holding home meds for bowel rest b. As needed Lopressor  5. Chronic combined systolic/diastolic HF, not in acute exacerbation a. Monitor volume status as she is getting IV fluids   6. RA,  stable  7. GERD a. Convert home PPI to Protonix IV while n.p.o.  8. History of Marfan's   DVT prophylaxis: lovenox   Code Status: Full Code  Diet: NPO Family Communication: Admission, patients condition and plan of care including tests being ordered have been discussed with the patient who indicates understanding and agrees with the plan and Code Status. Patient's daughter was updated  Disposition Plan: The appropriate patient status for this patient is INPATIENT. Inpatient status is judged to be reasonable and necessary in order to provide the required intensity of service to ensure the patient's safety. The patient's presenting symptoms, physical exam findings, and initial radiographic and laboratory data in the context of their chronic comorbidities is felt to place them at high risk for further clinical deterioration. Furthermore, it is not anticipated that the patient will be medically stable for discharge from the hospital within 2 midnights of admission. The following factors support the patient status of inpatient.   " The patient's presenting symptoms include abdominal pain, poor po intake. " The worrisome physical exam findings include ventral hernia, colostomy. " The initial radiographic and laboratory data are worrisome because of SBO. " The chronic co-morbidities include marfans, blindness, ventral hernia, RA, atrial flutter.   * I certify that at the point of admission it is my clinical judgment that the patient will require inpatient hospital care spanning beyond 2 midnights from the point of admission due to high intensity of service, high risk for further deterioration and high frequency of surveillance required.*    Consultants  . none  Procedures  . none  Time Spent on Admission: 67 minutes    Harold Hedge, DO [facility]  05/07/2020, 1:59 PM

## 2020-05-07 NOTE — ED Notes (Signed)
Attempted to call report, they will call back if they have staffing for tonight and can accept that patient.

## 2020-05-07 NOTE — Progress Notes (Signed)
Patient from home and plan to return home. Daughter is primary care giver. Daughter and patient are happy with plan.

## 2020-05-07 NOTE — ED Triage Notes (Signed)
Pt to Er from home via EMS complaining of nausea/vomitting since last night.  Pt also reports 10/10 abdominal pain as well.  Pt has colostomy bag in place.

## 2020-05-07 NOTE — Progress Notes (Signed)
Attempted to call report, was put on hold for more than 10 mins. Will re-attempt.

## 2020-05-07 NOTE — ED Provider Notes (Signed)
[facility] DEPT Provider Note   CSN: [id number] Arrival date & time: 05/07/20  0865     History Chief Complaint  Patient presents with  . Nausea  . Emesis    [name] is a 65 y.o. female.  HPI She presents for evaluation of abdominal pain present since last night and associated with nausea and vomiting.  She denies altered output through her colostomy.  She thinks her pain is from her hernia in her abdominal wall.  She is taking her usual medication without relief of the pain.  She denies fever, chills, cough, chest pain, weakness or dizziness.  She states that she lives alone and that her daughter called EMS after the patient contacted the daughter by telephone.  No other recent illnesses.  There are no other known modifying factors.    Past Medical History:  Diagnosis Date  . Atrial flutter ([place])   . CHF (congestive heart failure) ([place])   . Chronic gout 2008  . Diverticulitis of large intestine with perforation   . Dysrhythmia   . Fibroid   . Hearing aid worn    pt wears bilateral hearing aids  . Hernia 4/08  . Hx of adenomatous polyp of colon 11/13/2016  . Hypertension   . Legally blind    since pt was a teenager  . Marfan's syndrome affecting skin    with scolosis  . Osteoporosis   . 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 Rheumatoid arthritis [facility])    sees Dr. Gavin Pound   . SBO (small bowel obstruction) ([place]) 01/05/2013  . Small bowel obstruction due to adhesions ([place])   . Status post bunionectomy 1/10   bilateral   . Stroke ([place]) 2/08  . Substance abuse ([place])    recovering alcoholic H84 years   . 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 Left renal mass 10/23/2017  . Hx of adenomatous polyp of colon 11/13/2016  . 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 Chronic combined systolic and diastolic heart failure ([place])   . Ventral hernia without  obstruction or gangrene 06/06/2015  . Renal mass   . Pulmonary HTN ([place]) 01/01/2014  . Iron deficiency anemia 01/01/2014  . Atrial flutter ([place]) 12/31/2013  . Encounter for ostomy care education 11/30/2013  . Stoma dermatitis 11/30/2013  . Incisional hernia 11/30/2013  . Constipation, chronic 11/30/2013  . Charcot's joint of foot 07/07/2013  . Bilateral leg edema 04/20/2013  . 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 Rheumatoid arthritis ([place]) 06/07/2009  . THYROID NODULE 07/01/2007  . LUNG NODULE 07/01/2007  . Osteoporosis 05/07/2007  . 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 Blindness 04/12/2007  . HEMORRHOIDS, INTERNAL 04/12/2007  . 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 Hyperlipidemia 01/07/2007  . Essential hypertension 01/07/2007  . GERD 01/07/2007  . Marfan's syndrome 01/07/2007  . Stroke ([place]) 07/2006  . Chronic gout 2008    Past Surgical History:  Procedure Laterality Date  . ABLATION  01/04/14   atrial flutter ablation (2 circuits) by Dr Lovena Le  . ATRIAL FLUTTER ABLATION N/A 01/04/2014   Procedure: ATRIAL FLUTTER ABLATION;  Surgeon: Evans Lance, MD;  Location: [facility] CATH LAB;  Service: Cardiovascular;  Laterality: N/A;  . BUNIONECTOMY Bilateral 1/10  . COLON SURGERY     colectomy  . COLOSTOMY  11/27/2012  . HERNIA REPAIR    . IR RADIOLOGIST  EVAL & MGMT  10/01/2017  . IR RADIOLOGIST EVAL & MGMT  11/21/2017  . IR RADIOLOGIST EVAL & MGMT  02/13/2018  . IR RADIOLOGIST EVAL & MGMT  01/08/2019  . IR RADIOLOGIST EVAL & MGMT  12/29/2019  . LAPAROSCOPIC ABDOMINAL EXPLORATION N/A 01/06/2013   Procedure: LAPAROSCOPIC ABDOMINAL EXPLORATION;  Surgeon: Adin Hector, MD;  Location: [facility] ORS;  Service: General;  Laterality: N/A;  . LAPAROSCOPIC LYSIS OF ADHESIONS N/A 01/06/2013   Procedure: LAPAROSCOPIC LYSIS OF ADHESIONS/ INTEROTOMY REPAIR;  Surgeon: Adin Hector, MD;  Location: [facility] ORS;  Service: General;  Laterality: N/A;  . LAPAROTOMY N/A  11/27/2012   Procedure: EXPLORATORY LAPAROTOMY SIGMOID COLECTOMY, COLOSTOMY;  Surgeon: Madilyn Hook, DO;  Location: [facility] ORS;  Service: General;  Laterality: N/A;  . RADIOLOGY WITH ANESTHESIA Left 10/23/2017   Procedure: CT RENAL CRYO AND BIOPSY;  Surgeon: Aletta Edouard, MD;  Location: [facility] ORS;  Service: Radiology;  Laterality: Left;  . TOTAL KNEE ARTHROPLASTY Bilateral 6/08     OB History    Gravida  1   Para  1   Term  1   Preterm      AB      Living  1     SAB      TAB      Ectopic      Multiple      Live Births  1           Family History  Problem Relation Age of Onset  . Heart attack Neg Hx   . Colon cancer Neg Hx   . Osteoporosis Neg Hx     Social History   Tobacco Use  . Smoking status: Former Smoker    Packs/day: 1.00    Types: Cigarettes    Quit date: 11/27/2012    Years since quitting: 7.4  . Smokeless tobacco: Never Used  Vaping Use  . Vaping Use: Former  Substance Use Topics  . Alcohol use: No    Alcohol/week: 0.0 standard drinks    Comment: recovering alcoholic sober since 9326  . Drug use: No    Home Medications Prior to Admission medications   Medication Sig Start Date End Date Taking? Authorizing Provider  Abatacept (ORENCIA) 125 MG/ML SOSY Inject 125 mg into the skin every Monday.     [provider]  albuterol (VENTOLIN HFA) 108 (90 Base) MCG/ACT inhaler Inhale 1 puff into the lungs every 4 (four) hours as needed for wheezing or shortness of breath. 10/19/19   [name], MD  allopurinol (ZYLOPRIM) 100 MG tablet TAKE 1 TABLET(100 MG) BY MOUTH DAILY (SCHEDULE PHYSICAL FOR ADDITIONAL REFILLS) 04/07/20   [name], MD  amLODipine (NORVASC) 10 MG tablet TAKE 1 TABLET(10 MG) BY MOUTH DAILY (SCHEDULE PHYSICAL FOR ADDITIONAL REFILLS) 04/07/20   [name], MD  aspirin EC 81 MG tablet Take 1 tablet (81 mg total) by mouth daily. 05/11/14   Verlee Monte, MD  CALCIUM PO Take 1 tablet by mouth 2 (two) times daily.    [provider]  cetirizine (ZYRTEC) 10 MG tablet Take 1 tablet (10 mg total) by mouth daily. 11/17/14   [name], MD  Cholecalciferol (VITAMIN D) 2000 units CAPS Take 2,000 Units by mouth daily.    [provider]  CIPRODEX OTIC suspension INSTILL 4 DROPS UNTO EACH EAR TWICE DAILY AS NEEDED 06/19/18   [name], MD  denosumab (PROLIA) 60 MG/ML SOSY injection Inject 60 mg into the skin  every 6 (six) months.    [provider]  diclofenac sodium (VOLTAREN) 1 % GEL Apply 1 application topically 4 (four) times daily as needed (arthritis).    [provider]  docusate sodium (COLACE) 100 MG capsule Take 100 mg by mouth 2 (two) times daily.    [provider]  esomeprazole (NEXIUM) 40 MG capsule TAKE 1 CAPSULE(40 MG) BY MOUTH DAILY 04/30/19   [name], MD  folic acid (FOLVITE) 710 MCG tablet Take 400 mcg by mouth daily.    [provider]  HYDROcodone-acetaminophen (NORCO) 10-325 MG tablet Take 1 tablet by mouth every 6 (six) hours as needed for moderate pain. 06/20/20 07/20/20  [name], MD  ketoconazole (NIZORAL) 2 % cream Apply 1 application topically daily as needed for irritation.    [provider]  megestrol (MEGACE ES) 625 MG/5ML suspension take 5 milliliters by mouth three times daily before meals as needed for appetite 10/08/18   [name], MD  Methotrexate Sodium (METHOTREXATE, PF,) 50 MG/2ML injection Inject 0.5 mLs into the muscle every Monday. 05/14/16   [provider]  metoCLOPramide (REGLAN) 10 MG tablet Take 1 tablet (10 mg total) by mouth every 6 (six) hours. 01/18/20   [name], MD  Multiple Vitamin (MULTIVITAMIN WITH MINERALS) TABS tablet Take 1 tablet by mouth daily.    [provider]  polyethylene glycol (MIRALAX / GLYCOLAX) 17 g packet Take 17 g by mouth daily as needed for mild constipation.    [provider]  tiZANidine (ZANAFLEX) 4 MG tablet Take 1 tablet (4 mg total)  by mouth 2 (two) times daily. 11/17/19   [name], MD  traMADol (ULTRAM) 50 MG tablet TAKE 1 TO 2 TABLETS BY MOUTH EVERY 6 HOURS AS NEEDED 02/11/20   [name], MD  triamcinolone (KENALOG) 0.025 % cream Apply 1 application topically daily as needed (itching).    [provider]  vitamin B-12 (CYANOCOBALAMIN) 500 MCG tablet Take 500 mcg by mouth daily.    [provider]  vitamin C (ASCORBIC ACID) 500 MG tablet Take 500 mg by mouth daily.     [provider]    Allergies    Spinach  Review of Systems   Review of Systems  All other systems reviewed and are negative.   Physical Exam Updated Vital Signs BP (!) 132/101   Pulse (!) 54   Temp 98.1 F (36.7 C) (Oral)   Resp 18   Ht 5\' 8"  (1.727 m)   Wt 61.2 kg   LMP 06/25/2006 (LMP Unknown)   SpO2 96%   BMI 20.53 kg/m   Physical Exam Vitals and nursing note reviewed.  Constitutional:      General: She is not in acute distress.    Appearance: She is well-developed. She is ill-appearing. She is not toxic-appearing or diaphoretic.     Comments: Frail, appears older than stated age.  HENT:     Head: Normocephalic and atraumatic.     Nose: No congestion.     Mouth/Throat:     Pharynx: No oropharyngeal exudate or posterior oropharyngeal erythema.  Eyes:     Conjunctiva/sclera: Conjunctivae normal.     Pupils: Pupils are equal, round, and reactive to light.  Neck:     Trachea: Phonation normal.  Cardiovascular:     Rate and Rhythm: Normal rate and regular rhythm.  Pulmonary:     Effort: Pulmonary effort is normal.     Breath sounds:  Normal breath sounds.  Chest:     Chest wall: No tenderness.  Abdominal:     General: There is no distension.     Palpations: Abdomen is soft.     Tenderness: There is abdominal tenderness (Diffuse, mild). There is no guarding.     Comments: Reducible midline hernia, with diffuse abdominal tenderness.  Brown stool in colostomy bag in left mid abdomen.   Musculoskeletal:        General: No deformity. Normal range of motion.     Cervical back: Normal range of motion and neck supple.     Right lower leg: No edema.  Skin:    General: Skin is warm and dry.  Neurological:     Mental Status: She is alert and oriented to person, place, and time.     Motor: No abnormal muscle tone.  Psychiatric:        Mood and Affect: Mood normal.        Behavior: Behavior normal.        Thought Content: Thought content normal.        Judgment: Judgment normal.     ED Results / Procedures / Treatments   Labs (all labs ordered are listed, but only abnormal results are displayed) Labs Reviewed  COMPREHENSIVE METABOLIC PANEL - Abnormal; Notable for the following components:      Result Value   Glucose, Bld 180 (*)    Total Protein 9.0 (*)    All other components within normal limits  CBC - Abnormal; Notable for the following components:   WBC 14.5 (*)    Hemoglobin 15.3 (*)    HCT 47.1 (*)    All other components within normal limits  URINALYSIS, ROUTINE W REFLEX MICROSCOPIC - Abnormal; Notable for the following components:   APPearance HAZY (*)    Ketones, ur 5 (*)    Protein, ur 30 (*)    All other components within normal limits  RESPIRATORY PANEL BY RT PCR (FLU A&B, COVID)  LIPASE, BLOOD    EKG None  Radiology CT Abdomen Pelvis W Contrast  Result Date: 05/07/2020 CLINICAL DATA:  Abdominal pain and nausea and vomiting. Underwent colonoscopy yesterday. EXAM: CT ABDOMEN AND PELVIS WITH CONTRAST TECHNIQUE: Multidetector CT imaging of the abdomen and pelvis was performed using the standard protocol following bolus administration of intravenous contrast. CONTRAST:  17mL OMNIPAQUE IOHEXOL 300 MG/ML  SOLN COMPARISON:  09/14/2019 from [facility] FINDINGS: Lower Chest: No acute findings. Hepatobiliary: No hepatic masses identified. Several hepatic cysts again noted. Gallbladder is unremarkable. No evidence of biliary ductal  dilatation. Pancreas:  No mass or inflammatory changes. Spleen: Within normal limits in size and appearance. Adrenals/Urinary Tract: Renal parenchymal scarring is again seen involving left kidney greater than right. A subtle low-attenuation lesion is again seen in the anterior interpolar region of the right kidney. This measures 9 mm on image 28/, and is too small to characterize, but shows no significant change compared to prior studies. No evidence of ureteral calculi or hydronephrosis. Stomach/Bowel: Descending colostomy is again seen in the left lower quadrant. Mild colonic diverticulosis is noted, however there is no evidence of diverticulitis. Moderately dilated fluid-filled duodenum and jejunum is seen, with a transition point in the left lower abdomen (image 49/2). No mass or inflammatory process is seen at this site in this may be due to adhesion or internal hernia. A large wide-mouthed midline suprapubic ventral hernia is seen containing multiple small bowel loops and transverse colon, however there is  no evidence of strangulation. No evidence of inflammatory process, abscess, or free intraperitoneal air. Vascular/Lymphatic: No pathologically enlarged lymph nodes. No abdominal aortic aneurysm. Aortic atherosclerotic calcification noted. Reproductive:  No mass or other significant abnormality. Other:  None. Musculoskeletal: No suspicious bone lesions identified. Severe thoracolumbar rotatory levoscoliosis and degenerative changes noted. Severe bilateral hip osteoarthritis also seen. IMPRESSION: Mid small bowel obstruction, with transition point in the left lower abdomen, likely due to adhesion or internal hernia. Large wide-mouthed suprapubic ventral hernia containing multiple small bowel loops and transverse colon. No evidence of strangulation. Colonic diverticulosis, without radiographic evidence of diverticulitis. Aortic Atherosclerosis (ICD10-I70.0). Electronically Signed   By: Marlaine Hind M.D.   On:  05/07/2020 12:08    Procedures Procedures (including critical care time)  Medications Ordered in ED Medications  iohexol (OMNIPAQUE) 300 MG/ML solution 100 mL (100 mLs Intravenous Contrast Given 05/07/20 1109)    ED Course  I have reviewed the triage vital signs and the nursing notes.  Pertinent labs & imaging results that were available during my care of the patient were reviewed by me and considered in my medical decision making (see chart for details).  Clinical Course as of May 07 1304  Sat May 07, 2020  1257 Per radiologist consistent with SBO, secondary to adhesion, left lower quadrant.  CT Abdomen Pelvis W Contrast [EW]  1300 Normal except presence of ketones and protein  Urinalysis, Routine w reflex microscopic Urine, Catheterized(!) [EW]  1300 Normal except glucose high, total protein high  Comprehensive metabolic panel(!) [EW]  4270 Normal   [EW]  1300 Normal except white count high  CBC(!) [EW]    Clinical Course User Index [EW] Daleen Bo, MD   MDM Rules/Calculators/A&P                           Patient Vitals for the past 24 hrs:  BP Temp Temp src Pulse Resp SpO2 Height Weight  05/07/20 1230 -- -- -- (!) 54 18 96 % -- --  05/07/20 1215 (!) 132/101 -- -- (!) 59 19 95 % -- --  05/07/20 1200 121/90 -- -- (!) 55 (!) 21 95 % -- --  05/07/20 1145 122/84 -- -- (!) 110 20 94 % -- --  05/07/20 1130 -- -- -- (!) 110 19 91 % -- --  05/07/20 1100 109/65 -- -- (!) 113 19 94 % -- --  05/07/20 1045 118/71 -- -- (!) 109 20 95 % -- --  05/07/20 1030 120/74 -- -- (!) 117 (!) 21 96 % -- --  05/07/20 1015 114/75 -- -- (!) 105 20 95 % -- --  05/07/20 1000 124/84 -- -- 100 20 96 % -- --  05/07/20 0945 114/75 -- -- (!) 106 (!) 21 96 % -- --  05/07/20 0930 (!) 154/88 -- -- (!) 56 20 96 % -- --  05/07/20 0915 (!) 146/92 -- -- (!) 52 19 96 % -- --  05/07/20 0900 134/76 -- -- (!) 55 (!) 21 95 % -- --  05/07/20 0845 117/85 -- -- (!) 123 20 93 % -- --  05/07/20 0830 (!)  147/114 -- -- (!) 113 (!) 21 97 % -- --  05/07/20 0815 -- -- -- (!) 49 20 97 % -- --  05/07/20 0811 -- -- -- -- -- 97 % -- --  05/07/20 0810 140/83 -- -- 92 20 97 % -- --  05/07/20 0800 -- -- -- (!) 54 20  96 % -- --  05/07/20 0745 -- -- -- (!) 55 (!) 21 97 % -- --  05/07/20 0730 -- -- -- (!) 28 (!) 21 96 % -- --  05/07/20 0715 -- -- -- (!) 54 19 95 % -- --  05/07/20 0700 130/88 -- -- (!) 57 17 96 % -- --  05/07/20 0645 -- -- -- (!) 58 16 95 % 5\' 8"  (1.727 m) 61.2 kg  05/07/20 0644 (!) 131/92 98.1 F (36.7 C) Oral (!) 118 16 96 % -- --    1:10 PM Reevaluation with update and discussion. After initial assessment and treatment, an updated evaluation reveals she states that she is feeling better, since arrival.  She agrees to hospitalization. Daleen Bo   Medical Decision Making:  This patient is presenting for evaluation of abdominal pain, which does require a range of treatment options, and is a complaint that involves a high risk of morbidity and mortality. The differential diagnoses include bowel obstruction, bowel infection, enteritis, colitis. I decided to review old records, and in summary abdominal pain, elderly patient, with large ventral hernia and colostomy, not currently receiving acute cancer care.  I did not require additional historical information from anyone.  Clinical Laboratory Tests Ordered, included lipase, CBC, Metabolic panel and Urinalysis. Review indicates normal except mild elevation of glucose and white count. Radiologic Tests Ordered, included CT abdomen pelvis.  I independently Visualized: Radiographic images, which show mechanical small bowel obstruction, small intestine    Critical Interventions-clinical evaluation, laboratory testing, IV fluids, CT imaging, observation reassessment  After These Interventions, the Patient was reevaluated and was found with mechanical small bowel obstruction rerequiring hospitalization and bowel rest.  Anticipate hospitalist  admission, with surgery consultation.  CRITICAL CARE-no Performed by: Daleen Bo  Nursing Notes Reviewed/ Care Coordinated Applicable Imaging Reviewed Interpretation of Laboratory Data incorporated into ED treatment      Final Clinical Impression(s) / ED Diagnoses Final diagnoses:  Generalized abdominal pain  Small bowel obstruction [facility])   Nursing Notes Reviewed/ Care Coordinated Applicable Imaging Reviewed Interpretation of Laboratory Data incorporated into ED treatment  1:05 PM-Consult complete with hospitalist. Patient case explained and discussed.  He agrees to admit patient for further evaluation and treatment.  He request surgical consultation as well, call ended at 1:10 PM  Rx / DC Orders ED Discharge Orders    None       Daleen Bo, MD 05/07/20 1531

## 2020-05-07 NOTE — Progress Notes (Signed)
Patient admitted from ED right at shift change, vitals taken and telemetry applied and verified, patient oriented to room, in bed resting, reported off to night shift nurse to F/U with plan of care.    in the room

## 2020-05-07 NOTE — ED Notes (Signed)
Daughter called and updated on room status.

## 2020-05-07 NOTE — ED Notes (Signed)
Patient attached to [place]

## 2020-05-08 ENCOUNTER — Encounter (HOSPITAL_COMMUNITY): Payer: Self-pay | Admitting: Internal Medicine

## 2020-05-08 ENCOUNTER — Other Ambulatory Visit: Payer: Self-pay | Admitting: Family Medicine

## 2020-05-08 LAB — BASIC METABOLIC PANEL
Anion gap: 10 (ref 5–15)
BUN: 22 mg/dL (ref 8–23)
CO2: 25 mmol/L (ref 22–32)
Calcium: 8 mg/dL — ABNORMAL LOW (ref 8.9–10.3)
Chloride: 108 mmol/L (ref 98–111)
Creatinine, Ser: 0.7 mg/dL (ref 0.44–1.00)
GFR, Estimated: >60 mL/min (ref >60)
Glucose, Bld: 110 mg/dL — ABNORMAL HIGH (ref 70–99)
Potassium: 3.6 mmol/L (ref 3.5–5.1)
Sodium: 143 mmol/L (ref 135–145)

## 2020-05-08 LAB — CBC
HCT: 36.2 % (ref 36.0–46.0)
Hemoglobin: 11.5 g/dL — ABNORMAL LOW (ref 12.0–15.0)
MCH: 31.3 pg (ref 26.0–34.0)
MCHC: 31.8 g/dL (ref 30.0–36.0)
MCV: 98.4 fL (ref 80.0–100.0)
Platelets: 252 10*3/uL (ref 150–400)
RBC: 3.68 MIL/uL — ABNORMAL LOW (ref 3.87–5.11)
RDW: 14 % (ref 11.5–15.5)
WBC: 6.3 10*3/uL (ref 4.0–10.5)
nRBC: 0 % (ref 0.0–0.2)

## 2020-05-08 MED ORDER — KCL-LACTATED RINGERS-D5W 20 MEQ/L IV SOLN
INTRAVENOUS | Status: DC
  Filled 2020-05-08 (×3): qty 1000

## 2020-05-08 NOTE — Progress Notes (Signed)
PROGRESS NOTE  [name] AYT:[id number] DOB: 1956/05/28 DOA: 05/07/2020 PCP: [name], MD  HPI/Recap of past 24 hours: This is a 64 year old female with a history of atrial flutter not on [place], diverticulitis with perforation s/p colectomy/ostomy in 2014, marfan's syndrome, RA, blindness, papillary renal cell CA, chronic constipation, recurrent SBO who presented to [facility] ED with abdominal pain, nausea and vomiting x1 day.  States this is similar to prior bowel obstructions but with the last few she waited out at home until it resolved.  Last night her pain became so severe and she was unable to keep any of her medications down which prompted her to come to the ED. still with stool output in her colostomy, was last emptied on Wednesday 05/04/20.  ED Course:  CT abdomen pelvis w contrast: mild SBO likely due to adhesion or internal hernia, large wide mouthed suprapubic ventral hernia containing multiple small bowel loops and transverse colon without evidence of strangulation.  05/08/20:  Seen and examined at her bedside.  Abdominal pain is improved this AM after IV morphine.  Had some nausea which resolved after IV Zofran.  General surgery consulted, Dr. Harlow Asa will see in consultation.     Assessment/Plan: Principal Problem:   SBO (small bowel obstruction) (HCC) Active Problems:   Blindness   Essential hypertension   Atrial flutter (HCC)   Chronic combined systolic and diastolic heart failure (HCC)  SBO with transition point in the left lower abdomen, possibly from adhesions in the setting of large ventral hernia and hx of abdominal surgery, post colectomy with colostomy a. Continue bowel rest, n.p.o., and IV fluid LRD5/KCl 20 mEq at 100 cc/h. b. Defer Gastrografin ingestion to general surgery-patient currently does not have an NG tube in place.  She has not vomiting since being in her room on the telemetry unit. c. Monitor electrolytes and replace as indicated d. Goal potassium  greater than 4.0, magnesium greater than 2.0. e. Mobilize as tolerated f. Avoid opiate use  Essential hypertension, BP is currently soft Hold off antihypertensives Continue to monitor vital signs  Chronic diastolic CHF Euvolemic on exam Last 2D echo done on 03/30/2019 showed normal LVEF 60 to 65%. Closely monitor volume status while on IV fluid. Strict I's and O's and daily weight.  History of a flutter/A. fib status post ablation in 2015 Currently in sinus rhythm. Continue to monitor on telemetry.  Chronic large ventral hernia, no evidence of strangulation on CT scan. Monitor   Resolved leukocytosis, likely reactive in the setting of SBO.   No evidence of active infective process Leukocytosis has resolved.   Afebrile.  Nontoxic-appearing.  RA, stable, resume home regimen when no longer n.p.o.  GERD Stable Continue IV PPI.  History of Marfan's  Gout/blindness Resume home regimen when able   DVT prophylaxis: Lovenox SQ Lovenox    Code Status: Full Code   Diet: NPO  Family Communication:  None at bedside.  Disposition Plan:  Likely will discharge home once her SBO has resolved.  Consultants  General surgery.  Procedures   none  Status is: Inpatient    Dispo:  Patient From: Home  Planned Disposition: Home  Expected discharge date: 05/10/20  Medically stable for discharge: No, ongoing management of SBO.         Objective: Vitals:   05/07/20 1906 05/08/20 0211 05/08/20 0533 05/08/20 1250  BP: 124/72 136/76 (!) 110/53 (!) 114/56  Pulse: 99 78 (!) 58 86  Resp: 18 17 19  17  Temp: 98.3 F (36.8 C) 98 F (36.7 C) 99.1 F (37.3 C) 98.9 F (37.2 C)  TempSrc: Oral   Oral  SpO2: 99% 100% 95% 94%  Weight: 69.9 kg     Height: 5\' 8"  (1.727 m)       Intake/Output Summary (Last 24 hours) at 05/08/2020 1535 Last data filed at 05/08/2020 1333 Gross per 24 hour  Intake 1717.89 ml  Output 600 ml  Net 1117.89 ml   Filed Weights   05/07/20  0645 05/07/20 1906  Weight: 61.2 kg 69.9 kg    Exam:  . General: 64 y.o. year-old female well developed well nourished in no acute distress.  Alert and oriented x3.  Patient is blind. . Cardiovascular: Regular rate and rhythm with no rubs or gallops.  No thyromegaly or JVD noted.   Marland Kitchen Respiratory: Clear to auscultation with no wheezes or rales. Good inspiratory effort. . Abdomen: Large ventral hernia noted.  Colostomy bag noted with stool in it.  Faint bowel sounds noted.   . Musculoskeletal: No lower extremity edema bilaterally.   . Skin: No ulcerative lesions noted or rashes, . Psychiatry: Mood is appropriate for condition and setting   Data Reviewed: CBC: Recent Labs  Lab 05/07/20 0647 05/08/20 0555  WBC 14.5* 6.3  HGB 15.3* 11.5*  HCT 47.1* 36.2  MCV 96.5 98.4  PLT 347 315   Basic Metabolic Panel: Recent Labs  Lab 05/07/20 0647 05/08/20 0555  NA 139 143  K 4.3 3.6  CL 99 108  CO2 26 25  GLUCOSE 180* 110*  BUN 21 22  CREATININE 0.97 0.70  CALCIUM 10.0 8.0*  MG 1.9  --   PHOS 3.3  --    GFR: Estimated Creatinine Clearance: 71.7 mL/min (by C-G formula based on SCr of 0.7 mg/dL). Liver Function Tests: Recent Labs  Lab 05/07/20 0647  AST 31  ALT 21  ALKPHOS 85  BILITOT 0.9  PROT 9.0*  ALBUMIN 5.0   Recent Labs  Lab 05/07/20 0647  LIPASE 42   No results for input(s): AMMONIA in the last 168 hours. Coagulation Profile: No results for input(s): INR, PROTIME in the last 168 hours. Cardiac Enzymes: No results for input(s): CKTOTAL, CKMB, CKMBINDEX, TROPONINI in the last 168 hours. BNP (last 3 results) No results for input(s): PROBNP in the last 8760 hours. HbA1C: No results for input(s): HGBA1C in the last 72 hours. CBG: No results for input(s): GLUCAP in the last 168 hours. Lipid Profile: No results for input(s): CHOL, HDL, LDLCALC, TRIG, CHOLHDL, LDLDIRECT in the last 72 hours. Thyroid Function Tests: No results for input(s): TSH, T4TOTAL, FREET4,  T3FREE, THYROIDAB in the last 72 hours. Anemia Panel: No results for input(s): VITAMINB12, FOLATE, FERRITIN, TIBC, IRON, RETICCTPCT in the last 72 hours. Urine analysis:    Component Value Date/Time   COLORURINE YELLOW 05/07/2020 0647   APPEARANCEUR HAZY (A) 05/07/2020 0647   LABSPEC 1.018 05/07/2020 0647   PHURINE 5.0 05/07/2020 0647   GLUCOSEU NEGATIVE 05/07/2020 0647   HGBUR NEGATIVE 05/07/2020 0647   BILIRUBINUR NEGATIVE 05/07/2020 0647   BILIRUBINUR neg 06/06/2015 1201   KETONESUR 5 (A) 05/07/2020 0647   PROTEINUR 30 (A) 05/07/2020 0647   UROBILINOGEN 0.2 06/06/2015 1201   UROBILINOGEN 0.2 11/22/2014 0929   NITRITE NEGATIVE 05/07/2020 0647   LEUKOCYTESUR NEGATIVE 05/07/2020 0647   Sepsis Labs: @LABRCNTIP (procalcitonin:4,lacticidven:4)  ) Recent Results (from the past 240 hour(s))  Respiratory Panel by RT PCR (Flu A&B, Covid) - Nasopharyngeal Swab  Status: None   Collection Time: 05/07/20  1:05 PM   Specimen: Nasopharyngeal Swab  Result Value Ref Range Status   SARS Coronavirus 2 by RT PCR NEGATIVE NEGATIVE Final    Comment: (NOTE) SARS-CoV-2 target nucleic acids are NOT DETECTED.  The SARS-CoV-2 RNA is generally detectable in upper respiratoy specimens during the acute phase of infection. The lowest concentration of SARS-CoV-2 viral copies this assay can detect is 131 copies/mL. A negative result does not preclude SARS-Cov-2 infection and should not be used as the sole basis for treatment or other patient management decisions. A negative result may occur with  improper specimen collection/handling, submission of specimen other than nasopharyngeal swab, presence of viral mutation(s) within the areas targeted by this assay, and inadequate number of viral copies (<131 copies/mL). A negative result must be combined with clinical observations, patient history, and epidemiological information. The expected result is Negative.  Fact Sheet for Patients:   PinkCheek.be  Fact Sheet for Healthcare Providers:  GravelBags.it  This test is no t yet approved or cleared by the [place] FDA and  has been authorized for detection and/or diagnosis of SARS-CoV-2 by FDA under an Emergency Use Authorization (EUA). This EUA will remain  in effect (meaning this test can be used) for the duration of the COVID-19 declaration under Section 564(b)(1) of the Act, 21 U.S.C. section 360bbb-3(b)(1), unless the authorization is terminated or revoked sooner.     Influenza A by PCR NEGATIVE NEGATIVE Final   Influenza B by PCR NEGATIVE NEGATIVE Final    Comment: (NOTE) The Xpert Xpress SARS-CoV-2/FLU/RSV assay is intended as an aid in  the diagnosis of influenza from Nasopharyngeal swab specimens and  should not be used as a sole basis for treatment. Nasal washings and  aspirates are unacceptable for Xpert Xpress SARS-CoV-2/FLU/RSV  testing.  Fact Sheet for Patients: PinkCheek.be  Fact Sheet for Healthcare Providers: GravelBags.it  This test is not yet approved or cleared by the [place] FDA and  has been authorized for detection and/or diagnosis of SARS-CoV-2 by  FDA under an Emergency Use Authorization (EUA). This EUA will remain  in effect (meaning this test can be used) for the duration of the  Covid-19 declaration under Section 564(b)(1) of the Act, 21  U.S.C. section 360bbb-3(b)(1), unless the authorization is  terminated or revoked. Performed at [facility], [place] [address] [place], [place] [address]       Studies: No results found.  Scheduled Meds: . enoxaparin (LOVENOX) injection  40 mg Subcutaneous Q24H  . pantoprazole (PROTONIX) IV  40 mg Intravenous Daily  . sodium chloride flush  3 mL Intravenous Q12H    Continuous Infusions:   LOS: 1 day     Kayleen Memos, MD [facility] Pager [phone]  If 7PM-7AM, please contact night-coverage [email or web address] Password [email or web address] 05/08/2020, 3:35 PM

## 2020-05-08 NOTE — Consult Note (Signed)
General Surgery [facility]  Reason for Consult: small bowel obstruction, recurrent  Referring Physician: Dr. Irene Pap, [facility]  [name] is an 64 y.o. female.  HPI: Patient is a 64 year old female admitted to the medical service with signs and symptoms of small bowel obstruction.  Patient has had recurrent episodes of symptoms of small bowel obstruction over the past several years.  Patient had undergone laparotomy with sigmoid colectomy for perforated diverticular disease with descending colostomy in 2014 performed by Dr. Madilyn Hook.  Patient developed small bowel obstruction requiring operative intervention with lysis of adhesions in 2016 performed by Dr. Neysa Bonito.  Patient has had intermittent symptoms which she has been able to manage at home until this past week when she developed nausea vomiting and abdominal pain.  Patient was admitted yesterday to the medical service.  CT scan of the abdomen and pelvis showed a mid small bowel obstruction with a transition point likely related to adhesions.  The stomach does not appear to be distended.  Patient has had no nausea or emesis now in 36 hours.  She denies any flatus or stools however.  Patient has a large ventral incisional hernia in the left lower quadrant of the abdominal wall.  She has been evaluated at the hernia center in [place], [place], by Dr. Sherren Mocha [place].  I am uncertain as to whether or not she is still being evaluated and managed through that clinic.  Past Medical History:  Diagnosis Date  . Atrial flutter ([place])   . CHF (congestive heart failure) ([place])   . Chronic gout 2008  . Diverticulitis of large intestine with perforation   . Dysrhythmia   . Fibroid   . Hearing aid worn    pt wears bilateral hearing aids  . Hernia 4/08  . Hx of adenomatous polyp of colon 11/13/2016  . Hypertension   . Legally blind    since pt was a teenager  . Marfan's syndrome affecting skin     with scolosis  . Osteoporosis   . 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 Rheumatoid arthritis [facility])    sees Dr. Gavin Pound   . SBO (small bowel obstruction) ([place]) 01/05/2013  . Small bowel obstruction due to adhesions ([place])   . Status post bunionectomy 1/10   bilateral   . Stroke ([place]) 2/08  . Substance abuse ([place])    recovering alcoholic W73 years   . 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 ABLATION  01/04/14   atrial flutter ablation (2 circuits) by Dr Lovena Le  . ATRIAL FLUTTER ABLATION N/A 01/04/2014   Procedure: ATRIAL FLUTTER ABLATION;  Surgeon: Evans Lance, MD;  Location: [facility] CATH LAB;  Service: Cardiovascular;  Laterality: N/A;  . BUNIONECTOMY Bilateral 1/10  . COLON SURGERY     colectomy  . COLOSTOMY  11/27/2012  . HERNIA REPAIR    . IR RADIOLOGIST EVAL & MGMT  10/01/2017  . IR RADIOLOGIST EVAL & MGMT  11/21/2017  . IR RADIOLOGIST EVAL & MGMT  02/13/2018  . IR RADIOLOGIST EVAL & MGMT  01/08/2019  . IR RADIOLOGIST EVAL & MGMT  12/29/2019  . LAPAROSCOPIC ABDOMINAL EXPLORATION N/A 01/06/2013   Procedure: LAPAROSCOPIC ABDOMINAL EXPLORATION;  Surgeon: Adin Hector, MD;  Location: [facility] ORS;  Service: General;  Laterality: N/A;  . LAPAROSCOPIC LYSIS OF ADHESIONS N/A 01/06/2013   Procedure: LAPAROSCOPIC LYSIS OF ADHESIONS/ INTEROTOMY REPAIR;  Surgeon: Adin Hector, MD;  Location: [facility] ORS;  Service: General;  Laterality: N/A;  . LAPAROTOMY N/A 11/27/2012   Procedure: EXPLORATORY LAPAROTOMY SIGMOID COLECTOMY, COLOSTOMY;  Surgeon: Madilyn Hook, DO;  Location: [facility] ORS;  Service: General;  Laterality: N/A;  . RADIOLOGY WITH ANESTHESIA Left 10/23/2017   Procedure: CT RENAL CRYO AND BIOPSY;  Surgeon: Aletta Edouard, MD;  Location: [facility] ORS;  Service: Radiology;  Laterality: Left;  . TOTAL KNEE ARTHROPLASTY Bilateral 6/08    Family History  Problem Relation Age of Onset  . Heart attack Neg Hx   . Colon cancer Neg Hx   .  Osteoporosis Neg Hx     Social History:  reports that she quit smoking about 7 years ago. Her smoking use included cigarettes. She smoked 1.00 pack per day. She has never used smokeless tobacco. She reports that she does not drink alcohol and does not use drugs.  Allergies:  Allergies  Allergen Reactions  . Spinach Itching, Rash and Other (See Comments)    Welts.    Medications: I have reviewed the patient's current medications.  Results for orders placed or performed during the hospital encounter of 05/07/20 (from the past 48 hour(s))  Lipase, blood     Status: None   Collection Time: 05/07/20  6:47 AM  Result Value Ref Range   Lipase 42 11 - 51 U/L    Comment: Performed at [facility], [place] [address], [place], [place] [address]  Comprehensive metabolic panel     Status: Abnormal   Collection Time: 05/07/20  6:47 AM  Result Value Ref Range   Sodium 139 135 - 145 mmol/L   Potassium 4.3 3.5 - 5.1 mmol/L   Chloride 99 98 - 111 mmol/L   CO2 26 22 - 32 mmol/L   Glucose, Bld 180 (H) 70 - 99 mg/dL    Comment: Glucose reference range applies only to samples taken after fasting for at least 8 hours.   BUN 21 8 - 23 mg/dL   Creatinine, Ser 0.97 0.44 - 1.00 mg/dL   Calcium 10.0 8.9 - 10.3 mg/dL   Total Protein 9.0 (H) 6.5 - 8.1 g/dL   Albumin 5.0 3.5 - 5.0 g/dL   AST 31 15 - 41 U/L   ALT 21 0 - 44 U/L   Alkaline Phosphatase 85 38 - 126 U/L   Total Bilirubin 0.9 0.3 - 1.2 mg/dL   GFR, Estimated >60 >60 mL/min    Comment: (NOTE) Calculated using the CKD-EPI Creatinine Equation (2021)    Anion gap 14 5 - 15    Comment: Performed at [facility], [place] [address] [place], [place] [address]  CBC     Status: Abnormal   Collection Time: 05/07/20  6:47 AM  Result Value Ref Range   WBC 14.5 (H) 4.0 - 10.5 K/uL   RBC 4.88 3.87 - 5.11 MIL/uL   Hemoglobin 15.3 (H) 12.0 - 15.0 g/dL   HCT 47.1 (H) 36 - 46 %   MCV 96.5 80.0 - 100.0 fL   MCH 31.4 26.0 -  34.0 pg   MCHC 32.5 30.0 - 36.0 g/dL   RDW 13.6 11.5 - 15.5 %   Platelets 347 150 - 400 K/uL   nRBC 0.0 0.0 - 0.2 %    Comment: Performed at [facility], [place] [address] [place], [place] [address]  Urinalysis, Routine w reflex microscopic Urine, Catheterized     Status: Abnormal   Collection Time: 05/07/20  6:47 AM  Result Value Ref Range   Color, Urine YELLOW YELLOW   APPearance HAZY (A) CLEAR   Specific Gravity, Urine 1.018 1.005 - 1.030   pH 5.0 5.0 - 8.0   Glucose, UA NEGATIVE NEGATIVE mg/dL   Hgb urine dipstick NEGATIVE NEGATIVE   Bilirubin Urine NEGATIVE NEGATIVE   Ketones, ur 5 (A) NEGATIVE mg/dL   Protein, ur 30 (A) NEGATIVE mg/dL   Nitrite NEGATIVE NEGATIVE   Leukocytes,Ua NEGATIVE NEGATIVE   RBC / HPF 0-5 0 - 5 RBC/hpf   WBC, UA 0-5 0 - 5 WBC/hpf   Bacteria, UA NONE SEEN NONE SEEN   Squamous Epithelial / LPF 0-5 0 - 5   Mucus PRESENT    Hyaline Casts, UA PRESENT     Comment: Performed at [facility], [place] [address], [place], [place] [address]  Magnesium     Status: None   Collection Time: 05/07/20  6:47 AM  Result Value Ref Range   Magnesium 1.9 1.7 - 2.4 mg/dL    Comment: Performed at [facility], [place] [address], [place], [place] [address]  Phosphorus     Status: None   Collection Time: 05/07/20  6:47 AM  Result Value Ref Range   Phosphorus 3.3 2.5 - 4.6 mg/dL    Comment: Performed at [facility], [place] [address], [place], [place] [address]  Respiratory Panel by RT PCR (Flu A&B, Covid) - Nasopharyngeal Swab     Status: None   Collection Time: 05/07/20  1:05 PM   Specimen: Nasopharyngeal Swab  Result Value Ref Range   SARS Coronavirus 2 by RT PCR NEGATIVE NEGATIVE    Comment: (NOTE) SARS-CoV-2 target nucleic acids are NOT DETECTED.  The SARS-CoV-2 RNA is generally detectable in upper respiratoy specimens during the acute phase of infection. The lowest concentration of SARS-CoV-2  viral copies this assay can detect is 131 copies/mL. A negative result does not preclude SARS-Cov-2 infection and should not be used as the sole basis for treatment or other patient management decisions. A negative result may occur with  improper specimen collection/handling, submission of specimen other than nasopharyngeal swab, presence of viral mutation(s) within the areas targeted by this assay, and inadequate number of viral copies (<131 copies/mL). A negative result must be combined with clinical observations, patient history, and epidemiological information. The expected result is Negative.  Fact Sheet for Patients:  PinkCheek.be  Fact Sheet for Healthcare Providers:  GravelBags.it  This test is no t yet approved or cleared by the [place] FDA and  has been authorized for detection and/or diagnosis of SARS-CoV-2 by FDA under an Emergency Use Authorization (EUA). This EUA will remain  in effect (meaning this test can be used) for the duration of the COVID-19 declaration under Section 564(b)(1) of the Act, 21 U.S.C. section 360bbb-3(b)(1), unless the authorization is terminated or revoked sooner.     Influenza A by PCR NEGATIVE NEGATIVE   Influenza B by PCR NEGATIVE NEGATIVE    Comment: (NOTE) The Xpert Xpress SARS-CoV-2/FLU/RSV assay is intended as an aid in  the diagnosis of influenza from Nasopharyngeal swab specimens and  should not be used as a sole basis for treatment. Nasal washings and  aspirates are unacceptable for Xpert Xpress SARS-CoV-2/FLU/RSV  testing.  Fact Sheet for Patients: PinkCheek.be  Fact Sheet for Healthcare Providers: GravelBags.it  This test is not yet approved or cleared by the [place] FDA and  has been authorized for detection and/or diagnosis of  SARS-CoV-2 by  FDA under an Emergency Use Authorization (EUA). This EUA  will remain  in effect (meaning this test can be used) for the duration of the  Covid-19 declaration under Section 564(b)(1) of the Act, 21  U.S.C. section 360bbb-3(b)(1), unless the authorization is  terminated or revoked. Performed at [facility], [place] [address], [place], [place] [address]   HIV Antibody (routine testing w rflx)     Status: None   Collection Time: 05/07/20  1:48 PM  Result Value Ref Range   HIV Screen 4th Generation wRfx Non Reactive Non Reactive    Comment: Performed at [facility], [place] [address], [place], [place] [address]  Troponin I (High Sensitivity)     Status: None   Collection Time: 05/07/20  2:04 PM  Result Value Ref Range   Troponin I (High Sensitivity) 6 <18 ng/L    Comment: (NOTE) Elevated high sensitivity troponin I (hsTnI) values and significant  changes across serial measurements may suggest ACS but many other  chronic and acute conditions are known to elevate hsTnI results.  Refer to the "Links" section for chest pain algorithms and additional  guidance. Performed at [facility], [place] [address], [place], [place] [address]   Troponin I (High Sensitivity)     Status: None   Collection Time: 05/07/20  4:04 PM  Result Value Ref Range   Troponin I (High Sensitivity) 9 <18 ng/L    Comment: (NOTE) Elevated high sensitivity troponin I (hsTnI) values and significant  changes across serial measurements may suggest ACS but many other  chronic and acute conditions are known to elevate hsTnI results.  Refer to the "Links" section for chest pain algorithms and additional  guidance. Performed at [facility], [place] [address], [place], [place] [address]   Basic metabolic panel     Status: Abnormal   Collection Time: 05/08/20  5:55 AM  Result Value Ref Range   Sodium 143 135 - 145 mmol/L   Potassium 3.6 3.5 - 5.1 mmol/L   Chloride 108 98 - 111 mmol/L   CO2 25 22 - 32 mmol/L   Glucose, Bld  110 (H) 70 - 99 mg/dL    Comment: Glucose reference range applies only to samples taken after fasting for at least 8 hours.   BUN 22 8 - 23 mg/dL   Creatinine, Ser 0.70 0.44 - 1.00 mg/dL   Calcium 8.0 (L) 8.9 - 10.3 mg/dL   GFR, Estimated >60 >60 mL/min    Comment: (NOTE) Calculated using the CKD-EPI Creatinine Equation (2021)    Anion gap 10 5 - 15    Comment: Performed at [facility], [place] [address] [place], [place] [address]  CBC     Status: Abnormal   Collection Time: 05/08/20  5:55 AM  Result Value Ref Range   WBC 6.3 4.0 - 10.5 K/uL   RBC 3.68 (L) 3.87 - 5.11 MIL/uL   Hemoglobin 11.5 (L) 12.0 - 15.0 g/dL   HCT 36.2 36 - 46 %   MCV 98.4 80.0 - 100.0 fL   MCH 31.3 26.0 - 34.0 pg   MCHC 31.8 30.0 - 36.0 g/dL   RDW 14.0 11.5 - 15.5 %   Platelets 252 150 - 400 K/uL   nRBC 0.0 0.0 - 0.2 %    Comment: Performed at [facility], [place] [address], [place], [place] [address]    CT Abdomen Pelvis W Contrast  Result Date:  05/07/2020 CLINICAL DATA:  Abdominal pain and nausea and vomiting. Underwent colonoscopy yesterday. EXAM: CT ABDOMEN AND PELVIS WITH CONTRAST TECHNIQUE: Multidetector CT imaging of the abdomen and pelvis was performed using the standard protocol following bolus administration of intravenous contrast. CONTRAST:  124mL OMNIPAQUE IOHEXOL 300 MG/ML  SOLN COMPARISON:  09/14/2019 from [facility] FINDINGS: Lower Chest: No acute findings. Hepatobiliary: No hepatic masses identified. Several hepatic cysts again noted. Gallbladder is unremarkable. No evidence of biliary ductal dilatation. Pancreas:  No mass or inflammatory changes. Spleen: Within normal limits in size and appearance. Adrenals/Urinary Tract: Renal parenchymal scarring is again seen involving left kidney greater than right. A subtle low-attenuation lesion is again seen in the anterior interpolar region of the right kidney. This measures 9 mm on image 28/, and is  too small to characterize, but shows no significant change compared to prior studies. No evidence of ureteral calculi or hydronephrosis. Stomach/Bowel: Descending colostomy is again seen in the left lower quadrant. Mild colonic diverticulosis is noted, however there is no evidence of diverticulitis. Moderately dilated fluid-filled duodenum and jejunum is seen, with a transition point in the left lower abdomen (image 49/2). No mass or inflammatory process is seen at this site in this may be due to adhesion or internal hernia. A large wide-mouthed midline suprapubic ventral hernia is seen containing multiple small bowel loops and transverse colon, however there is no evidence of strangulation. No evidence of inflammatory process, abscess, or free intraperitoneal air. Vascular/Lymphatic: No pathologically enlarged lymph nodes. No abdominal aortic aneurysm. Aortic atherosclerotic calcification noted. Reproductive:  No mass or other significant abnormality. Other:  None. Musculoskeletal: No suspicious bone lesions identified. Severe thoracolumbar rotatory levoscoliosis and degenerative changes noted. Severe bilateral hip osteoarthritis also seen. IMPRESSION: Mid small bowel obstruction, with transition point in the left lower abdomen, likely due to adhesion or internal hernia. Large wide-mouthed suprapubic ventral hernia containing multiple small bowel loops and transverse colon. No evidence of strangulation. Colonic diverticulosis, without radiographic evidence of diverticulitis. Aortic Atherosclerosis (ICD10-I70.0). Electronically Signed   By: Marlaine Hind M.D.   On: 05/07/2020 12:08    Review of Systems  Constitutional: Positive for appetite change.  HENT: Negative.   Eyes:       Blindness  Respiratory: Negative.   Cardiovascular: Negative.   Gastrointestinal: Positive for abdominal pain, nausea and vomiting.  Endocrine: Negative.   Genitourinary: Negative.   Musculoskeletal: Positive for joint swelling.   Skin: Negative.   Allergic/Immunologic: Positive for immunocompromised state.  Neurological: Negative.   Hematological: Negative.   Psychiatric/Behavioral: Negative.     Physical Exam  Blood pressure (!) 114/56, pulse 86, temperature 98.9 F (37.2 C), temperature source Oral, resp. rate 17, height 5\' 8"  (1.727 m), weight 69.9 kg, last menstrual period 06/25/2006, SpO2 94 %.  CONSTITUTIONAL: no acute distress; conversant  EYES: Closed, blind  NECK: trachea midline; no thyroid nodularity  LUNGS: respiratory effort normal & unlabored; no wheeze; no rales; no tactile fremitus  CV: rate and rhythm regular; no palpable thrills; no murmur; no edema bilat lower extremities  GI: Abdomen is soft without signficant distension; ostomy in left mid abdomen with small liquid stool in bag; no hepatosplenomegaly; large hernia left lower abdominal wall containing bowel, soft, non-tender, reducible but spontaneously prolapses  MSK: Significant changes of RA in hands bilat  PSYCH: appropriate affect for situation; alert and oriented to person, place, & time  LYMPHATIC: no palpable cervical lymphadenopathy; no evidence lymphedema in extremities   Assessment/Plan: Small bowel obstruction, likely secondary  to adhesions, recurrent Large incisional hernia without incarceration  Agree with NPO, IV hydration  Would hold off on NG tube at this time as stomach is decompressed and patient has no nausea or emesis  Will consider oral gastrograffin in AM 11/15 if no significant improvement overnight  AXR in AM 11/15  Encouraged OOB, ambulation with assistance  Armandina Gemma, MD [facility] Office: [place] 05/08/2020, 5:21 PM

## 2020-05-08 NOTE — Evaluation (Signed)
Physical Therapy Evaluation Patient Details Name: [name] MRN: [id number] DOB: [birth year] Today's Date: 05/08/2020   History of Present Illness  64 year old female with a history of atrial flutter, diverticulitis with perforation s/p colectomy/ostomy 2014, marfan's syndrome, RA, blind, papillary renal cell CA, SBO who presented to the ED with abdominal pain since last night with associated nausea and vomiting. admitted with SBO  Clinical Impression  Pt admitted with above diagnosis.  Min assist overall, incr cues d/t unfamiliar environment. See below for details. Will follow in acute setting. No f/u recommended at this time   Pt currently with functional limitations due to the deficits listed below (see PT Problem List). Pt will benefit from skilled PT to increase their independence and safety with mobility to allow discharge to the venue listed below.       Follow Up Recommendations No PT follow up    Equipment Recommendations  None recommended by PT    Recommendations for Other Services       Precautions / Restrictions Precautions Precautions: Fall Restrictions Weight Bearing Restrictions: No      Mobility  Bed Mobility Overal bed mobility: Needs Assistance Bed Mobility: Supine to Sit;Sit to Supine     Supine to sit: Min assist Sit to supine: Min assist   General bed mobility comments: assist with LEs, environmental/directional cues d/t blindness    Transfers Overall transfer level: Needs assistance Equipment used: (P) Rolling walker (2 wheeled) Transfers: Sit to/from [facility] Sit to Stand: Min assist Stand pivot transfers: Min assist       General transfer comment: assist to rise and transition to RW, directional/environmental cues d/t blindness  Ambulation/Gait             General Gait Details: pivotal steps to BSC, lateral steps along EOB. pt declines further amb today  Stairs            Wheelchair Mobility     Modified Rankin (Stroke Patients Only)       Balance Overall balance assessment: Needs assistance Sitting-balance support: No upper extremity supported;Feet supported Sitting balance-Leahy Scale: Good       Standing balance-Leahy Scale: Poor Standing balance comment: reliant on at least unilateral UE support, required assist with pericare today d/t reliance on UEs for balance                             Pertinent Vitals/Pain Pain Assessment: No/denies pain    Home Living Family/patient expects to be discharged to:: Private residence Living Arrangements: Alone Available Help at Discharge: Personal care attendant;Available PRN/intermittently Type of Home: Apartment Home Access: Level entry     Home Layout: One level Home Equipment: Walker - 2 wheels;Bedside commode Additional Comments: pt states her aide comes daily and can stay up to 4 hrs but she usually sends her away after 2.  chart review indicates that dtr assists, pt does not confirm this when asked    Prior Function Level of Independence: Independent with assistive device(s)         Comments: amb in apt with RW     Hand Dominance        Extremity/Trunk Assessment   Upper Extremity Assessment Upper Extremity Assessment: Defer to OT evaluation    Lower Extremity Assessment Lower Extremity Assessment: Generalized weakness;RLE deficits/detail RLE Deficits / Details: baseline limitations/joint deformities d/t marfan's. strength grossly 3/5 RLE Coordination: decreased fine motor;decreased gross motor  Communication   Communication: No difficulties  Cognition Arousal/Alertness: Awake/alert Behavior During Therapy: WFL for tasks assessed/performed Overall Cognitive Status: Within Functional Limits for tasks assessed                                        General Comments      Exercises     Assessment/Plan    PT Assessment Patient needs continued PT services   PT Problem List Decreased strength;Decreased activity tolerance;Decreased mobility       PT Treatment Interventions DME instruction;Therapeutic exercise;Gait training;Functional mobility training;Therapeutic activities;Patient/family education    PT Goals (Current goals can be found in the Care Plan section)  Acute Rehab PT Goals Patient Stated Goal: feel better PT Goal Formulation: With patient Time For Goal Achievement: 05/08/20 Potential to Achieve Goals: Good    Frequency Min 3X/week   Barriers to discharge        Co-evaluation               AM-PAC PT "6 Clicks" Mobility  Outcome Measure Help needed turning from your back to your side while in a flat bed without using bedrails?: A Little Help needed moving from lying on your back to sitting on the side of a flat bed without using bedrails?: A Little Help needed moving to and from a bed to a chair (including a wheelchair)?: A Little Help needed standing up from a chair using your arms (e.g., wheelchair or bedside chair)?: A Little Help needed to walk in hospital room?: A Little Help needed climbing 3-5 steps with a railing? : A Lot 6 Click Score: 17    End of Session Equipment Utilized During Treatment: Gait belt Activity Tolerance: Patient tolerated treatment well Patient left: with call bell/phone within reach;in bed;with bed alarm set   PT Visit Diagnosis: Other abnormalities of gait and mobility (R26.89);Difficulty in walking, not elsewhere classified (R26.2)    Time: 5364-6803 PT Time Calculation (min) (ACUTE ONLY): 20 min   Charges:   PT Evaluation $PT Eval Low Complexity: [place], PT  Acute Rehab Dept ([place]) [phone] Pager [phone]  05/08/2020   [facility] 05/08/2020, 1:09 PM

## 2020-05-09 ENCOUNTER — Inpatient Hospital Stay (HOSPITAL_COMMUNITY): Payer: Medicare Other

## 2020-05-09 LAB — CBC
HCT: 32.4 % — ABNORMAL LOW (ref 36.0–46.0)
Hemoglobin: 10.3 g/dL — ABNORMAL LOW (ref 12.0–15.0)
MCH: 31.6 pg (ref 26.0–34.0)
MCHC: 31.8 g/dL (ref 30.0–36.0)
MCV: 99.4 fL (ref 80.0–100.0)
Platelets: 200 10*3/uL (ref 150–400)
RBC: 3.26 MIL/uL — ABNORMAL LOW (ref 3.87–5.11)
RDW: 13.5 % (ref 11.5–15.5)
WBC: 6.3 10*3/uL (ref 4.0–10.5)
nRBC: 0 % (ref 0.0–0.2)

## 2020-05-09 LAB — BASIC METABOLIC PANEL
Anion gap: 6 (ref 5–15)
BUN: 13 mg/dL (ref 8–23)
CO2: 25 mmol/L (ref 22–32)
Calcium: 7.7 mg/dL — ABNORMAL LOW (ref 8.9–10.3)
Chloride: 112 mmol/L — ABNORMAL HIGH (ref 98–111)
Creatinine, Ser: 0.58 mg/dL (ref 0.44–1.00)
GFR, Estimated: >60 mL/min (ref >60)
Glucose, Bld: 107 mg/dL — ABNORMAL HIGH (ref 70–99)
Potassium: 4 mmol/L (ref 3.5–5.1)
Sodium: 143 mmol/L (ref 135–145)

## 2020-05-09 LAB — MAGNESIUM: Magnesium: 1.9 mg/dL (ref 1.7–2.4)

## 2020-05-09 MED ORDER — PROSOURCE PLUS PO LIQD
30.0000 mL | Freq: Two times a day (BID) | ORAL | Status: DC
  Filled 2020-05-09 (×2): qty 30

## 2020-05-09 MED ORDER — SODIUM CHLORIDE 0.9 % IV SOLN: INTRAVENOUS | Status: DC

## 2020-05-09 MED ORDER — GUAIFENESIN-DM 100-10 MG/5ML PO SYRP
5.0000 mL | ORAL_SOLUTION | ORAL | Status: DC | PRN
  Administered 2020-05-09 – 2020-05-11 (×5): 5 mL via ORAL
  Filled 2020-05-09 (×5): qty 10

## 2020-05-09 MED ORDER — ADULT MULTIVITAMIN W/MINERALS CH
1.0000 | ORAL_TABLET | Freq: Every day | ORAL | Status: DC
  Administered 2020-05-09 – 2020-05-12 (×4): 1 via ORAL
  Filled 2020-05-09 (×4): qty 1

## 2020-05-09 MED ORDER — ABATACEPT 125 MG/ML ~~LOC~~ SOSY
125.0000 mg | PREFILLED_SYRINGE | SUBCUTANEOUS | Status: DC
  Administered 2020-05-09: 125 mg via SUBCUTANEOUS

## 2020-05-09 MED ORDER — AMLODIPINE BESYLATE 5 MG PO TABS
5.0000 mg | ORAL_TABLET | Freq: Every day | ORAL | Status: DC
  Administered 2020-05-09 – 2020-05-12 (×4): 5 mg via ORAL
  Filled 2020-05-09 (×4): qty 1

## 2020-05-09 MED ORDER — BOOST / RESOURCE BREEZE PO LIQD CUSTOM: 1.0000 | Freq: Two times a day (BID) | ORAL | Status: DC

## 2020-05-09 NOTE — Progress Notes (Signed)
Initial Nutrition Assessment  DOCUMENTATION CODES:   Non-severe (moderate) malnutrition in context of chronic illness  INTERVENTION:  - will order Boost Breeze BID, each supplement provides 250 kcal and 9 grams of protein. - will order 30 ml Prosource Plus BID, each supplement provides 100 kcal and 15 grams protein.  - will order 1 tablet multivitamin with minerals/day.   NUTRITION DIAGNOSIS:   Moderate Malnutrition related to chronic illness as evidenced by mild fat depletion, mild muscle depletion, moderate fat depletion, moderate muscle depletion.  GOAL:   Patient will meet greater than or equal to 90% of their needs  MONITOR:   PO intake, Supplement acceptance, Diet advancement, Labs, Weight trends  REASON FOR ASSESSMENT:   Malnutrition Screening Tool  ASSESSMENT:   64 year-old female with medical history of atrial flutter not on [place], diverticulitis with perforation s/p colectomy/ostomy in 2014, marfan's syndrome, RA, blindness, papillary renal cell cancer, chronic constipation, and recurrent SBO. She presented to the ED with abdominal pain, N/V x1 day which she reported to be similar to prior bowel obstructions.  Diet advanced from NPO to CLD today at 1027. Patient reports having jello, broth, Italian ice, and juice for lunch but that she did not consume much of each item. She denies any abdominal pain or nausea following intakes.   The last time she ate solid food was on Friday (11/12) at 2200 when she had fish and potato wedges. She states this is a normal meal for her every Friday night.   Patient reports that for a long time/unknown amount of time, she has had an appetite that comes and goes. When appetite is poor, she takes megace and this has been very helpful for her.   Weight on 11/13 was 154 lb and PTA the most recently documented weight was on 4/26 when she weighed 144 lb. Weight on 09/29/19 and 03/04/19 was 150 lb.   Per notes: - SBO possibly from adhesions due to  large ventral hernia with hx surgery for colectomy and colostomy - no evidence of strangulation of hernia on CT - hx of Marfan's syndrome    Labs reviewed; Cl: 112 mmol/l, Ca: 7.7 mg/dl. Medications reviewed; 40 mg IV protonix/day. IVF; NS @ 75 ml/hr.     NUTRITION - FOCUSED PHYSICAL EXAM:    Most Recent Value  Orbital Region Mild depletion  Upper Arm Region Mild depletion  Thoracic and Lumbar Region Mild depletion  Buccal Region Moderate depletion  Temple Region Mild depletion  Clavicle Bone Region Moderate depletion  Clavicle and Acromion Bone Region Moderate depletion  Scapular Bone Region Unable to assess  Dorsal Hand Unable to assess  [severe arthritis-like changes to both hands]  Patellar Region Unable to assess  Anterior Thigh Region Unable to assess  Posterior Calf Region Unable to assess  Edema (RD Assessment) Unable to assess  Hair Reviewed  Eyes Reviewed  Mouth Reviewed  Skin Reviewed  Nails Reviewed       Diet Order:   Diet Order            Diet clear liquid Room service appropriate? Yes; Fluid consistency: Thin  Diet effective now                 EDUCATION NEEDS:   No education needs have been identified at this time  Skin:  Skin Assessment: Reviewed RN Assessment  Last BM:  11/14  Height:   Ht Readings from Last 1 Encounters:  05/07/20 5\' 8"  (1.727 m)    Weight:  Wt Readings from Last 1 Encounters:  05/07/20 69.9 kg     Estimated Nutritional Needs:  Kcal:  1750-1950 kcal Protein:  90-100 grams Fluid:  >/= 2.1 L/day     Jarome Matin, MS, RD, LDN, CNSC Inpatient Clinical Dietitian RD pager # available in [place]  After hours/weekend pager # available in [facility]

## 2020-05-09 NOTE — Progress Notes (Signed)
Subjective/Chief Complaint: Denies pain or nausea. Reports stool and flatus.    Objective: Vital signs in last 24 hours: Temp:  [98.8 F (37.1 C)-98.9 F (37.2 C)] 98.8 F (37.1 C) (11/15 0541) Pulse Rate:  [75-86] 75 (11/15 0541) Resp:  [17-20] 20 (11/15 0541) BP: (102-139)/(56-89) 139/89 (11/15 0541) SpO2:  [94 %-99 %] 99 % (11/15 0541) Last BM Date: 05/08/20  Intake/Output from previous day: 11/14 0701 - 11/15 0700 In: 2314.3 [I.V.:2314.3] Out: 651 [Urine:650; Stool:1] Intake/Output this shift: No intake/output data recorded.  General appearance: alert and cooperative Resp: unlabored Cardio: regular rate and rhythm Abd: Soft, nondistended, nontender. Large LLQ hernia. Left sided ostomy with stool and air in bag.  Skin: Skin color, texture, turgor normal. No rashes or lesions  Lab Results:  Recent Labs    05/08/20 0555 05/09/20 0504  WBC 6.3 6.3  HGB 11.5* 10.3*  HCT 36.2 32.4*  PLT 252 200   BMET Recent Labs    05/08/20 0555 05/09/20 0504  NA 143 143  K 3.6 4.0  CL 108 112*  CO2 25 25  GLUCOSE 110* 107*  BUN 22 13  CREATININE 0.70 0.58  CALCIUM 8.0* 7.7*   PT/INR No results for input(s): LABPROT, INR in the last 72 hours. ABG No results for input(s): PHART, HCO3 in the last 72 hours.  Invalid input(s): PCO2, PO2  Studies/Results: CT Abdomen Pelvis W Contrast  Result Date: 05/07/2020 CLINICAL DATA:  Abdominal pain and nausea and vomiting. Underwent colonoscopy yesterday. EXAM: CT ABDOMEN AND PELVIS WITH CONTRAST TECHNIQUE: Multidetector CT imaging of the abdomen and pelvis was performed using the standard protocol following bolus administration of intravenous contrast. CONTRAST:  123mL OMNIPAQUE IOHEXOL 300 MG/ML  SOLN COMPARISON:  09/14/2019 from [facility] FINDINGS: Lower Chest: No acute findings. Hepatobiliary: No hepatic masses identified. Several hepatic cysts again noted. Gallbladder is unremarkable. No evidence of  biliary ductal dilatation. Pancreas:  No mass or inflammatory changes. Spleen: Within normal limits in size and appearance. Adrenals/Urinary Tract: Renal parenchymal scarring is again seen involving left kidney greater than right. A subtle low-attenuation lesion is again seen in the anterior interpolar region of the right kidney. This measures 9 mm on image 28/, and is too small to characterize, but shows no significant change compared to prior studies. No evidence of ureteral calculi or hydronephrosis. Stomach/Bowel: Descending colostomy is again seen in the left lower quadrant. Mild colonic diverticulosis is noted, however there is no evidence of diverticulitis. Moderately dilated fluid-filled duodenum and jejunum is seen, with a transition point in the left lower abdomen (image 49/2). No mass or inflammatory process is seen at this site in this may be due to adhesion or internal hernia. A large wide-mouthed midline suprapubic ventral hernia is seen containing multiple small bowel loops and transverse colon, however there is no evidence of strangulation. No evidence of inflammatory process, abscess, or free intraperitoneal air. Vascular/Lymphatic: No pathologically enlarged lymph nodes. No abdominal aortic aneurysm. Aortic atherosclerotic calcification noted. Reproductive:  No mass or other significant abnormality. Other:  None. Musculoskeletal: No suspicious bone lesions identified. Severe thoracolumbar rotatory levoscoliosis and degenerative changes noted. Severe bilateral hip osteoarthritis also seen. IMPRESSION: Mid small bowel obstruction, with transition point in the left lower abdomen, likely due to adhesion or internal hernia. Large wide-mouthed suprapubic ventral hernia containing multiple small bowel loops and transverse colon. No evidence of strangulation. Colonic diverticulosis, without radiographic evidence of diverticulitis. Aortic Atherosclerosis (ICD10-I70.0). Electronically Signed   By: Myles Rosenthal.D.  On: 05/07/2020 12:08   DG Abd Portable 1V  Result Date: 05/09/2020 CLINICAL DATA:  Small bowel obstruction EXAM: PORTABLE ABDOMEN - 1 VIEW COMPARISON:  CT from 2 days ago FINDINGS: Continued dilated bowel seen in the left abdomen with a small bowel loop measuring 3-4 cm. No concerning mass effect or gas collection. Advanced hip osteoarthritis. Advanced lumbar spine degeneration with scoliosis. Atherosclerosis. IMPRESSION: Ongoing small bowel obstruction. Electronically Signed   By: Monte Fantasia M.D.   On: 05/09/2020 07:08    Anti-infectives: Anti-infectives (From admission, onward)   None      Assessment/Plan: Small bowel obstruction, likely secondary to adhesions, recurrent Large incisional hernia without incarceration             Clinically improving  Try sips of clears today             Encouraged OOB, ambulation with assistance  LOS: 2 days    Clovis Riley 05/09/2020

## 2020-05-09 NOTE — Progress Notes (Signed)
Physical Therapy Treatment Patient Details Name: [name] MRN: [id number] DOB: June 21, 1956 Today's Date: 05/09/2020    History of Present Illness 64 year old female with a history of atrial flutter, diverticulitis with perforation s/p colectomy/ostomy 2014, marfan's syndrome, RA, blind, papillary renal cell CA, SBO who presented to the ED with abdominal pain since last night with associated nausea and vomiting. admitted with SBO    PT Comments    Patient progressing to ambulation into hallway this session, but needing assist for walker management and direction due to blindness.  She also reports only getting up to [facility] with nursing.  Encouraged her to walk to [facility] in the room with staff at least instead of having them bring it to the bedside.  Feel she will need follow up HHPT for safety in the home and strengthening.  PT to continue to follow acutely.   Follow Up Recommendations  Home health PT     Equipment Recommendations  None recommended by PT    Recommendations for Other Services       Precautions / Restrictions Precautions Precautions: Fall Precaution Comments: colostomy Required Braces or Orthoses: Other Brace Other Brace: orthopedic shoes in the room    Mobility  Bed Mobility Overal bed mobility: Needs Assistance Bed Mobility: Supine to Sit;Sit to Supine     Supine to sit: Min assist Sit to supine: Min assist   General bed mobility comments: assist with LEs, environmental/directional cues d/t blindness  Transfers Overall transfer level: Needs assistance Equipment used: Rolling walker (2 wheeled) Transfers: Sit to/from Stand Sit to Stand: Min assist Stand pivot transfers: Min assist       General transfer comment: assist to rise and transition to RW, directional/environmental cues d/t blindness; stand pivot to [facility] then back to bed  Ambulation/Gait Ambulation/Gait assistance: Min assist;Mod assist Gait Distance (Feet): 14 Feet Assistive device:  Rolling walker (2 wheeled) Gait Pattern/deviations: Step-to pattern;Step-through pattern;Decreased stride length     General Gait Details: ambulated into hallway then requested to return to room due to fatigue, HR 115; assist to guide walker (reports hers at home is weighted down with bags and basket)   Stairs             Wheelchair Mobility    Modified Rankin (Stroke Patients Only)       Balance Overall balance assessment: Needs assistance Sitting-balance support: No upper extremity supported Sitting balance-Leahy Scale: Good Sitting balance - Comments: seated EOB but assist to don shoes     Standing balance-Leahy Scale: Poor Standing balance comment: needing UE support, able to perform toilet hygiene seated on BSC                            Cognition Arousal/Alertness: Awake/alert Behavior During Therapy: WFL for tasks assessed/performed Overall Cognitive Status: Within Functional Limits for tasks assessed                                        Exercises      General Comments        Pertinent Vitals/Pain Pain Assessment: No/denies pain    Home Living                      Prior Function            PT Goals (current goals can now be  found in the care plan section) Progress towards PT goals: Progressing toward goals    Frequency    Min 3X/week      PT Plan Discharge plan needs to be updated    Co-evaluation              AM-PAC PT "6 Clicks" Mobility   Outcome Measure  Help needed turning from your back to your side while in a flat bed without using bedrails?: A Little Help needed moving from lying on your back to sitting on the side of a flat bed without using bedrails?: A Little Help needed moving to and from a bed to a chair (including a wheelchair)?: A Little Help needed standing up from a chair using your arms (e.g., wheelchair or bedside chair)?: A Little Help needed to walk in hospital room?:  A Little Help needed climbing 3-5 steps with a railing? : A Lot 6 Click Score: 17    End of Session Equipment Utilized During Treatment: Gait belt Activity Tolerance: Patient limited by fatigue Patient left: in bed;with call bell/phone within reach;with bed alarm set   PT Visit Diagnosis: Other abnormalities of gait and mobility (R26.89);Difficulty in walking, not elsewhere classified (R26.2)     Time: 7741-4239 PT Time Calculation (min) (ACUTE ONLY): 28 min  Charges:  $Gait Training: 8-22 mins $Therapeutic Activity: 8-22 mins                     Magda Kiel, PT Acute Rehabilitation Services Pager:[phone] [phone] 05/09/2020    Reginia Naas 05/09/2020, 4:09 PM

## 2020-05-09 NOTE — Progress Notes (Signed)
PROGRESS NOTE  [name] IHK:[id number] DOB: [birth year] DOA: 05/07/2020 PCP: [name], MD  HPI/Recap of past 24 hours: This is a 64 year old female with a history of atrial flutter not on [place], diverticulitis with perforation s/p colectomy/ostomy in 2014, marfan's syndrome, RA, blindness, papillary renal cell CA, chronic constipation, recurrent SBO who presented to [facility] ED with abdominal pain, nausea and vomiting x1 day.  States this is similar to prior bowel obstructions but with the last few she waited out at home until it resolved.  Last night her pain became so severe and she was unable to keep any of her medications down which prompted her to come to the ED. still with stool output in her colostomy, was last emptied on Wednesday 05/04/20.  ED Course:  CT abdomen pelvis w contrast: mild SBO likely due to adhesion or internal hernia, large wide mouthed suprapubic ventral hernia containing multiple small bowel loops and transverse colon without evidence of strangulation.  General surgery consulted, Dr. Harlow Asa.     05/09/20:  Abd xray showing persistent SBO, but clinically improving.  No nausea or significant abd pain.  Seen by gen surgery.  Started clear diet, monitoring.  Assessment/Plan: Principal Problem:   SBO (small bowel obstruction) (HCC) Active Problems:   Blindness   Essential hypertension   Atrial flutter (HCC)   Chronic combined systolic and diastolic heart failure (HCC)  SBO, persistent, with transition point in the left lower abdomen, possibly from adhesions in the setting of large ventral hernia and hx of abdominal surgery, post colectomy with colostomy a. C/w IV fluid LRD5/KCl 20 mEq at 100 cc/h. b. Clinically improving, started on clear by surgery, continue to monitor c. Monitor electrolytes and replace as indicated d. K+ 4.0 and mg2+ 1.9  e. Goal potassium greater than 4.0, magnesium greater than 2.0. f. Mobilize as tolerated g. Avoid opiate  use  Essential hypertension, BP is currently soft Resume home norvasc 5 mg daily Continue to monitor vital signs  Chronic diastolic CHF Euvolemic on exam Last 2D echo done on 03/30/2019 showed normal LVEF 60 to 65%. Closely monitor volume status while on IV fluid. C/w  Strict I's and O's and daily weight.  History of a flutter/A. fib status post ablation in 2015 Currently in sinus rhythm. Continue to monitor on telemetry.  Chronic large ventral hernia, no evidence of strangulation on CT scan. Monitor   Resolved leukocytosis, likely reactive in the setting of SBO.   No evidence of active infective process Leukocytosis has resolved.   Afebrile.  Nontoxic-appearing.  RA stable Resume home regimen  GERD Stable Continue IV PPI.  History of Marfan's  Gout/blindness Resume home regimen   DVT prophylaxis: Lovenox SQ Lovenox    Code Status: Full Code   Diet: Clear liquid diet  Family Communication:  None at bedside.  Disposition Plan:  Likely will discharge home once her SBO has resolved and surgery signs off.  Consultants  General surgery.  Procedures   none  Status is: Inpatient    Dispo:  Patient From: Home  Planned Disposition: Home  Expected discharge date: 05/10/20  Medically stable for discharge: No, ongoing management of SBO.         Objective: Vitals:   05/08/20 1250 05/08/20 2045 05/09/20 0541 05/09/20 1258  BP: (!) 114/56 102/83 139/89 (!) 145/78  Pulse: 86 80 75 80  Resp: 17 18 20 18   Temp: 98.9 F (37.2 C) 98.8 F (37.1 C) 98.8 F (37.1 C) 99.1  F (37.3 C)  TempSrc: Oral Oral Oral Oral  SpO2: 94% 97% 99% 99%  Weight:      Height:        Intake/Output Summary (Last 24 hours) at 05/09/2020 1702 Last data filed at 05/09/2020 1500 Gross per 24 hour  Intake 1721.93 ml  Output 251 ml  Net 1470.93 ml   Filed Weights   05/07/20 0645 05/07/20 1906  Weight: 61.2 kg 69.9 kg    Exam:  . General: 64 y.o. year-old  female WD WN NAD A&O x 3.  Blind. . Cardiovascular: RRR no rubs or gallops . Respiratory: CTA no wheezes or rales . Abdomen: Large ventral hernia noted.  Colostomy bag noted with stool in it.  Faint bowel sounds noted.   . Musculoskeletal: No LE edema bilaterally.   Marland Kitchen Psychiatry: Mood is appropriate   Data Reviewed: CBC: Recent Labs  Lab 05/07/20 0647 05/08/20 0555 05/09/20 0504  WBC 14.5* 6.3 6.3  HGB 15.3* 11.5* 10.3*  HCT 47.1* 36.2 32.4*  MCV 96.5 98.4 99.4  PLT 347 252 332   Basic Metabolic Panel: Recent Labs  Lab 05/07/20 0647 05/08/20 0555 05/09/20 0504  NA 139 143 143  K 4.3 3.6 4.0  CL 99 108 112*  CO2 26 25 25   GLUCOSE 180* 110* 107*  BUN 21 22 13   CREATININE 0.97 0.70 0.58  CALCIUM 10.0 8.0* 7.7*  MG 1.9  --  1.9  PHOS 3.3  --   --    GFR: Estimated Creatinine Clearance: 71.7 mL/min (by C-G formula based on SCr of 0.58 mg/dL). Liver Function Tests: Recent Labs  Lab 05/07/20 0647  AST 31  ALT 21  ALKPHOS 85  BILITOT 0.9  PROT 9.0*  ALBUMIN 5.0   Recent Labs  Lab 05/07/20 0647  LIPASE 42   No results for input(s): AMMONIA in the last 168 hours. Coagulation Profile: No results for input(s): INR, PROTIME in the last 168 hours. Cardiac Enzymes: No results for input(s): CKTOTAL, CKMB, CKMBINDEX, TROPONINI in the last 168 hours. BNP (last 3 results) No results for input(s): PROBNP in the last 8760 hours. HbA1C: No results for input(s): HGBA1C in the last 72 hours. CBG: No results for input(s): GLUCAP in the last 168 hours. Lipid Profile: No results for input(s): CHOL, HDL, LDLCALC, TRIG, CHOLHDL, LDLDIRECT in the last 72 hours. Thyroid Function Tests: No results for input(s): TSH, T4TOTAL, FREET4, T3FREE, THYROIDAB in the last 72 hours. Anemia Panel: No results for input(s): VITAMINB12, FOLATE, FERRITIN, TIBC, IRON, RETICCTPCT in the last 72 hours. Urine analysis:    Component Value Date/Time   COLORURINE YELLOW 05/07/2020 0647    APPEARANCEUR HAZY (A) 05/07/2020 0647   LABSPEC 1.018 05/07/2020 0647   PHURINE 5.0 05/07/2020 0647   GLUCOSEU NEGATIVE 05/07/2020 0647   HGBUR NEGATIVE 05/07/2020 0647   BILIRUBINUR NEGATIVE 05/07/2020 0647   BILIRUBINUR neg 06/06/2015 1201   KETONESUR 5 (A) 05/07/2020 0647   PROTEINUR 30 (A) 05/07/2020 0647   UROBILINOGEN 0.2 06/06/2015 1201   UROBILINOGEN 0.2 11/22/2014 0929   NITRITE NEGATIVE 05/07/2020 0647   LEUKOCYTESUR NEGATIVE 05/07/2020 0647   Sepsis Labs: @LABRCNTIP (procalcitonin:4,lacticidven:4)  ) Recent Results (from the past 240 hour(s))  Respiratory Panel by RT PCR (Flu A&B, Covid) - Nasopharyngeal Swab     Status: None   Collection Time: 05/07/20  1:05 PM   Specimen: Nasopharyngeal Swab  Result Value Ref Range Status   SARS Coronavirus 2 by RT PCR NEGATIVE NEGATIVE Final    Comment: (NOTE)  SARS-CoV-2 target nucleic acids are NOT DETECTED.  The SARS-CoV-2 RNA is generally detectable in upper respiratoy specimens during the acute phase of infection. The lowest concentration of SARS-CoV-2 viral copies this assay can detect is 131 copies/mL. A negative result does not preclude SARS-Cov-2 infection and should not be used as the sole basis for treatment or other patient management decisions. A negative result may occur with  improper specimen collection/handling, submission of specimen other than nasopharyngeal swab, presence of viral mutation(s) within the areas targeted by this assay, and inadequate number of viral copies (<131 copies/mL). A negative result must be combined with clinical observations, patient history, and epidemiological information. The expected result is Negative.  Fact Sheet for Patients:  PinkCheek.be  Fact Sheet for Healthcare Providers:  GravelBags.it  This test is no t yet approved or cleared by the [place] FDA and  has been authorized for detection and/or diagnosis of  SARS-CoV-2 by FDA under an Emergency Use Authorization (EUA). This EUA will remain  in effect (meaning this test can be used) for the duration of the COVID-19 declaration under Section 564(b)(1) of the Act, 21 U.S.C. section 360bbb-3(b)(1), unless the authorization is terminated or revoked sooner.     Influenza A by PCR NEGATIVE NEGATIVE Final   Influenza B by PCR NEGATIVE NEGATIVE Final    Comment: (NOTE) The Xpert Xpress SARS-CoV-2/FLU/RSV assay is intended as an aid in  the diagnosis of influenza from Nasopharyngeal swab specimens and  should not be used as a sole basis for treatment. Nasal washings and  aspirates are unacceptable for Xpert Xpress SARS-CoV-2/FLU/RSV  testing.  Fact Sheet for Patients: PinkCheek.be  Fact Sheet for Healthcare Providers: GravelBags.it  This test is not yet approved or cleared by the [place] FDA and  has been authorized for detection and/or diagnosis of SARS-CoV-2 by  FDA under an Emergency Use Authorization (EUA). This EUA will remain  in effect (meaning this test can be used) for the duration of the  Covid-19 declaration under Section 564(b)(1) of the Act, 21  U.S.C. section 360bbb-3(b)(1), unless the authorization is  terminated or revoked. Performed at [facility], [place] [address], [place], [place] [address]       Studies: DG Abd Portable 1V  Result Date: 05/09/2020 CLINICAL DATA:  Small bowel obstruction EXAM: PORTABLE ABDOMEN - 1 VIEW COMPARISON:  CT from 2 days ago FINDINGS: Continued dilated bowel seen in the left abdomen with a small bowel loop measuring 3-4 cm. No concerning mass effect or gas collection. Advanced hip osteoarthritis. Advanced lumbar spine degeneration with scoliosis. Atherosclerosis. IMPRESSION: Ongoing small bowel obstruction. Electronically Signed   By: Monte Fantasia M.D.   On: 05/09/2020 07:08    Scheduled Meds: . (feeding  supplement) PROSource Plus  30 mL Oral BID BM  . Abatacept  125 mg Subcutaneous Q Mon  . enoxaparin (LOVENOX) injection  40 mg Subcutaneous Q24H  . feeding supplement  1 Container Oral BID BM  . multivitamin with minerals  1 tablet Oral Daily  . pantoprazole (PROTONIX) IV  40 mg Intravenous Daily  . sodium chloride flush  3 mL Intravenous Q12H    Continuous Infusions: . sodium chloride 75 mL/hr at 05/09/20 1121     LOS: 2 days     Kayleen Memos, MD [facility] Pager [phone]  If 7PM-7AM, please contact night-coverage [email or web address] Password [email or web address] 05/09/2020, 5:02 PM

## 2020-05-10 ENCOUNTER — Inpatient Hospital Stay (HOSPITAL_COMMUNITY): Payer: Medicare Other

## 2020-05-10 LAB — MAGNESIUM: Magnesium: 1.8 mg/dL (ref 1.7–2.4)

## 2020-05-10 LAB — CBC
HCT: 34.6 % — ABNORMAL LOW (ref 36.0–46.0)
Hemoglobin: 10.8 g/dL — ABNORMAL LOW (ref 12.0–15.0)
MCH: 30.8 pg (ref 26.0–34.0)
MCHC: 31.2 g/dL (ref 30.0–36.0)
MCV: 98.6 fL (ref 80.0–100.0)
Platelets: 219 10*3/uL (ref 150–400)
RBC: 3.51 MIL/uL — ABNORMAL LOW (ref 3.87–5.11)
RDW: 13.2 % (ref 11.5–15.5)
WBC: 6.1 10*3/uL (ref 4.0–10.5)
nRBC: 0 % (ref 0.0–0.2)

## 2020-05-10 LAB — BASIC METABOLIC PANEL
Anion gap: 5 (ref 5–15)
BUN: <5 mg/dL — ABNORMAL LOW (ref 8–23)
CO2: 22 mmol/L (ref 22–32)
Calcium: 8.1 mg/dL — ABNORMAL LOW (ref 8.9–10.3)
Chloride: 112 mmol/L — ABNORMAL HIGH (ref 98–111)
Creatinine, Ser: 0.55 mg/dL (ref 0.44–1.00)
GFR, Estimated: >60 mL/min (ref >60)
Glucose, Bld: 95 mg/dL (ref 70–99)
Potassium: 3.6 mmol/L (ref 3.5–5.1)
Sodium: 139 mmol/L (ref 135–145)

## 2020-05-10 MED ORDER — MEGESTROL ACETATE 400 MG/10ML PO SUSP
400.0000 mg | Freq: Every day | ORAL | Status: DC
  Administered 2020-05-12: 400 mg via ORAL
  Filled 2020-05-10 (×3): qty 10

## 2020-05-10 MED ORDER — CYANOCOBALAMIN 250 MCG PO TABS
500.0000 ug | ORAL_TABLET | Freq: Every day | ORAL | Status: DC
  Administered 2020-05-10 – 2020-05-12 (×3): 500 ug via ORAL
  Filled 2020-05-10 (×3): qty 2

## 2020-05-10 MED ORDER — ALBUTEROL SULFATE (2.5 MG/3ML) 0.083% IN NEBU
INHALATION_SOLUTION | RESPIRATORY_TRACT | Status: AC
  Administered 2020-05-10: 2.5 mg
  Filled 2020-05-10: qty 3

## 2020-05-10 MED ORDER — GUAIFENESIN ER 600 MG PO TB12
1200.0000 mg | ORAL_TABLET | Freq: Two times a day (BID) | ORAL | Status: DC
  Administered 2020-05-10 – 2020-05-12 (×5): 1200 mg via ORAL
  Filled 2020-05-10 (×5): qty 2

## 2020-05-10 MED ORDER — IPRATROPIUM-ALBUTEROL 0.5-2.5 (3) MG/3ML IN SOLN: 3.0000 mL | Freq: Four times a day (QID) | RESPIRATORY_TRACT | Status: DC

## 2020-05-10 MED ORDER — CIPROFLOXACIN-DEXAMETHASONE 0.3-0.1 % OT SUSP
4.0000 [drp] | Freq: Two times a day (BID) | OTIC | Status: DC | PRN
  Filled 2020-05-10: qty 7.5

## 2020-05-10 MED ORDER — CALCIUM CARBONATE ANTACID 500 MG PO CHEW
200.0000 mg | CHEWABLE_TABLET | Freq: Two times a day (BID) | ORAL | Status: DC
  Administered 2020-05-10 – 2020-05-12 (×5): 200 mg via ORAL
  Filled 2020-05-10 (×4): qty 1

## 2020-05-10 MED ORDER — ALBUTEROL SULFATE (2.5 MG/3ML) 0.083% IN NEBU: 2.5000 mg | INHALATION_SOLUTION | RESPIRATORY_TRACT | Status: DC | PRN

## 2020-05-10 MED ORDER — IPRATROPIUM-ALBUTEROL 0.5-2.5 (3) MG/3ML IN SOLN
3.0000 mL | Freq: Two times a day (BID) | RESPIRATORY_TRACT | Status: DC
  Administered 2020-05-10 – 2020-05-12 (×4): 3 mL via RESPIRATORY_TRACT
  Filled 2020-05-10 (×7): qty 3

## 2020-05-10 MED ORDER — VITAMIN D 25 MCG (1000 UNIT) PO TABS
2000.0000 [IU] | ORAL_TABLET | Freq: Every day | ORAL | Status: DC
  Administered 2020-05-10 – 2020-05-12 (×3): 2000 [IU] via ORAL
  Filled 2020-05-10 (×3): qty 2

## 2020-05-10 MED ORDER — PANTOPRAZOLE SODIUM 40 MG PO TBEC
40.0000 mg | DELAYED_RELEASE_TABLET | Freq: Every day | ORAL | Status: DC
  Administered 2020-05-10 – 2020-05-12 (×3): 40 mg via ORAL
  Filled 2020-05-10 (×3): qty 1

## 2020-05-10 MED ORDER — ALLOPURINOL 100 MG PO TABS
100.0000 mg | ORAL_TABLET | Freq: Every day | ORAL | Status: DC
  Administered 2020-05-10 – 2020-05-12 (×3): 100 mg via ORAL
  Filled 2020-05-10 (×3): qty 1

## 2020-05-10 MED ORDER — MEGESTROL ACETATE 400 MG/10ML PO SUSP
625.0000 mg | Freq: Every day | ORAL | Status: DC
  Filled 2020-05-10: qty 20

## 2020-05-10 MED ORDER — TIZANIDINE HCL 4 MG PO TABS: 4.0000 mg | ORAL_TABLET | Freq: Two times a day (BID) | ORAL | Status: DC

## 2020-05-10 MED ORDER — FOLIC ACID 1 MG PO TABS
1000.0000 ug | ORAL_TABLET | Freq: Every day | ORAL | Status: DC
  Administered 2020-05-10 – 2020-05-12 (×3): 1 mg via ORAL
  Filled 2020-05-10 (×3): qty 1

## 2020-05-10 MED ORDER — TIZANIDINE HCL 4 MG PO TABS
4.0000 mg | ORAL_TABLET | Freq: Two times a day (BID) | ORAL | Status: DC
  Administered 2020-05-10 – 2020-05-12 (×5): 4 mg via ORAL
  Filled 2020-05-10 (×5): qty 1

## 2020-05-10 MED ORDER — IPRATROPIUM-ALBUTEROL 0.5-2.5 (3) MG/3ML IN SOLN: 3.0000 mL | Freq: Three times a day (TID) | RESPIRATORY_TRACT | Status: DC

## 2020-05-10 MED ORDER — ASPIRIN EC 81 MG PO TBEC
81.0000 mg | DELAYED_RELEASE_TABLET | Freq: Every day | ORAL | Status: DC
  Administered 2020-05-10 – 2020-05-12 (×3): 81 mg via ORAL
  Filled 2020-05-10 (×3): qty 1

## 2020-05-10 MED ORDER — ASCORBIC ACID 500 MG PO TABS
500.0000 mg | ORAL_TABLET | Freq: Every day | ORAL | Status: DC
  Administered 2020-05-10 – 2020-05-12 (×3): 500 mg via ORAL
  Filled 2020-05-10 (×3): qty 1

## 2020-05-10 MED ORDER — CALCIUM CARBONATE ANTACID 500 MG PO CHEW
CHEWABLE_TABLET | Freq: Two times a day (BID) | ORAL | Status: DC
  Filled 2020-05-10 (×2): qty 1

## 2020-05-10 MED ORDER — CHLORHEXIDINE GLUCONATE 0.12 % MT SOLN
15.0000 mL | Freq: Two times a day (BID) | OROMUCOSAL | Status: DC
  Administered 2020-05-10 – 2020-05-12 (×5): 15 mL via OROMUCOSAL
  Filled 2020-05-10 (×5): qty 15

## 2020-05-10 NOTE — Progress Notes (Signed)
Physical Therapy Treatment Patient Details Name: [name] MRN: [id number] DOB: [birth year] Today's Date: 05/10/2020    History of Present Illness 64 year old female with a history of atrial flutter, diverticulitis with perforation s/p colectomy/ostomy 2014, marfan's syndrome, RA, blind, papillary renal cell CA, SBO who presented to the ED with abdominal pain since last night with associated nausea and vomiting. admitted with SBO    PT Comments    Progressing with mobility.    Follow Up Recommendations  Home health PT     Equipment Recommendations  None recommended by PT    Recommendations for Other Services       Precautions / Restrictions Precautions Precautions: Fall Precaution Comments: colostomy Other Brace: orthopedic shoes in the room Restrictions Weight Bearing Restrictions: No    Mobility  Bed Mobility Overal bed mobility: Needs Assistance Bed Mobility: Supine to Sit;Sit to Supine     Supine to sit: Min assist Sit to supine: Min assist   General bed mobility comments: assist with LEs, environmental/directional cues d/t blindness  Transfers Overall transfer level: Needs assistance Equipment used: Rolling walker (2 wheeled) Transfers: Sit to/from Stand Sit to Stand: Min assist;From elevated surface         General transfer comment: assist to rise and transition to RW, directional/environmental cues d/t blindness  Ambulation/Gait Ambulation/Gait assistance: Min assist Gait Distance (Feet): 50 Feet Assistive device: Rolling walker (2 wheeled) Gait Pattern/deviations: Step-to pattern;Step-through pattern;Decreased stride length     General Gait Details: Assist to stabilize pt and guide RW. Slow gait speed. Pt tolerated distance well.   Stairs             Wheelchair Mobility    Modified Rankin (Stroke Patients Only)       Balance Overall balance assessment: Needs assistance         Standing balance support: Bilateral upper  extremity supported Standing balance-Leahy Scale: Poor                              Cognition Arousal/Alertness: Awake/alert Behavior During Therapy: WFL for tasks assessed/performed Overall Cognitive Status: Within Functional Limits for tasks assessed                                        Exercises      General Comments        Pertinent Vitals/Pain Pain Assessment: No/denies pain    Home Living                      Prior Function            PT Goals (current goals can now be found in the care plan section) Progress towards PT goals: Progressing toward goals    Frequency    Min 3X/week      PT Plan Current plan remains appropriate    Co-evaluation              AM-PAC PT "6 Clicks" Mobility   Outcome Measure  Help needed turning from your back to your side while in a flat bed without using bedrails?: A Little Help needed moving from lying on your back to sitting on the side of a flat bed without using bedrails?: A Little Help needed moving to and from a bed to a chair (including a wheelchair)?: A Little Help needed  standing up from a chair using your arms (e.g., wheelchair or bedside chair)?: A Little Help needed to walk in hospital room?: A Little Help needed climbing 3-5 steps with a railing? : A Lot 6 Click Score: 17    End of Session Equipment Utilized During Treatment: Gait belt Activity Tolerance: Patient limited by fatigue Patient left: in bed;with call bell/phone within reach;with bed alarm set   PT Visit Diagnosis: Other abnormalities of gait and mobility (R26.89);Difficulty in walking, not elsewhere classified (R26.2)     Time: 1994-1290 PT Time Calculation (min) (ACUTE ONLY): 17 min  Charges:  $Gait Training: 8-22 mins                        Doreatha Massed, PT Acute Rehabilitation  Office: [phone] Pager: [phone]

## 2020-05-10 NOTE — Progress Notes (Signed)
PROGRESS NOTE  [name] ZSW:[id number] DOB: [birth year] DOA: 05/07/2020 PCP: [name], MD  HPI/Recap of past 24 hours: This is a 64 year old female with a history of atrial flutter not on [place], diverticulitis with perforation s/p colectomy/ostomy in 2014, marfan's syndrome, RA, blindness, papillary renal cell CA, chronic constipation, recurrent SBO who presented to [facility] ED with abdominal pain, nausea and vomiting x1 day.  States this is similar to prior bowel obstructions but with the last few she waited out at home until it resolved.  Last night her pain became so severe and she was unable to keep any of her medications down which prompted her to come to the ED. still with stool output in her colostomy, was last emptied on Wednesday 05/04/20.  ED Course:  CT abdomen pelvis w contrast: mild SBO likely due to adhesion or internal hernia, large wide mouthed suprapubic ventral hernia containing multiple small bowel loops and transverse colon without evidence of strangulation.  General surgery consulted, Dr. Harlow Asa.     05/10/20: States she is passing a lot of air through her colostomy bag, watery stools noted.  Reports productive cough with wheezing.  Chest x-ray shows no active disease.  Abdominal x-ray showing suspected persistent degree of small bowel obstruction.  No free air evident on supine examination.  Seen by general surgery, on clear liquid diet since 05/09/2020.  She denies any significant nausea or abdominal pain.  No NG tube in place, no vomiting.  Assessment/Plan: Principal Problem:   SBO (small bowel obstruction) (HCC) Active Problems:   Blindness   Essential hypertension   Atrial flutter (HCC)   Chronic combined systolic and diastolic heart failure (HCC)  SBO, persistent, with transition point in the left lower abdomen, possibly from adhesions in the setting of large ventral hernia and hx of abdominal surgery, post colectomy with colostomy a. C/w IV fluid LRD5/KCl  20 mEq at 100 cc/h. b. Clinically improving, started on clear by surgery on 05/09/2020, continue to monitor c. Monitor electrolytes and replace as indicated d. K+ 4.0 and mg2+ 1.9  e. Goal potassium greater than 4.0, magnesium greater than 2.0. f. Mobilize as tolerated g. Avoid opiate use  Semi Productive cough, possibly from GERD Afebrile with no leukocytosis No evidence of active infective process Chest x-ray no active disease. Not hypoxic Start antitussives and flutter valve  Essential hypertension Continue home norvasc 5 mg daily Continue to monitor vital signs  Chronic diastolic CHF Euvolemic on exam Last 2D echo done on 03/30/2019 showed normal LVEF 60 to 65%. Continue to closely monitor volume status while on IV fluid. C/w  Strict I's and O's and daily weight.  History of a flutter/A. fib status post ablation in 2015 Currently in sinus rhythm. Continue to monitor on telemetry.  Chronic large ventral hernia, no evidence of strangulation on CT scan. Continue to monitor Surgery following   Resolved leukocytosis, likely reactive in the setting of SBO.   No evidence of active infective process Leukocytosis has resolved.   Afebrile.  Nontoxic-appearing.  RA stable Resume home regimen  GERD Stable Continue home regimen  History of Marfan's  Gout/blindness Continue home regimen   DVT prophylaxis: Lovenox SQ Lovenox    Code Status: Full Code   Diet: Clear liquid diet  Family Communication:  None at bedside.  Disposition Plan:  Likely will discharge home once her SBO has resolved and surgery signs off.  Consultants  General surgery.  Procedures   none  Status is: Inpatient  Dispo:  Patient From: Home  Planned Disposition: Home with Health Care Svc  Expected discharge date: 05/11/20  Medically stable for discharge: No, ongoing management of SBO.         Objective: Vitals:   05/09/20 1258 05/09/20 2114 05/10/20 0508 05/10/20  0859  BP: (!) 145/78 129/89 (!) 147/91   Pulse: 80 82 80 85  Resp: 18 18 20 17   Temp: 99.1 F (37.3 C) 98.9 F (37.2 C) 98.7 F (37.1 C)   TempSrc: Oral Oral Oral   SpO2: 99% 100% 98% 98%  Weight:      Height:        Intake/Output Summary (Last 24 hours) at 05/10/2020 1040 Last data filed at 05/10/2020 1540 Gross per 24 hour  Intake 873.29 ml  Output 1750 ml  Net -876.71 ml   Filed Weights   05/07/20 0645 05/07/20 1906  Weight: 61.2 kg 69.9 kg    Exam:  . General: 64 y.o. year-old female pleasant well-developed well-nourished in no distress.  Alert and oriented x3. Marland Kitchen .Cardiovascular: Regular rate and rhythm no rubs or gallops.  Marland Kitchen Respiratory: Clear to auscultation no wheezes no rales.  . Abdomen: Large ventral hernia noted.  Colostomy bag with watery stools noted.  Hypoactive bowel sounds. . Musculoskeletal: No lower extremity edema bilaterally. Marland Kitchen Psychiatry: Mood is appropriate for condition and setting.  Data Reviewed: CBC: Recent Labs  Lab 05/07/20 0647 05/08/20 0555 05/09/20 0504 05/10/20 0551  WBC 14.5* 6.3 6.3 6.1  HGB 15.3* 11.5* 10.3* 10.8*  HCT 47.1* 36.2 32.4* 34.6*  MCV 96.5 98.4 99.4 98.6  PLT 347 252 200 086   Basic Metabolic Panel: Recent Labs  Lab 05/07/20 0647 05/08/20 0555 05/09/20 0504 05/10/20 0551  NA 139 143 143 139  K 4.3 3.6 4.0 3.6  CL 99 108 112* 112*  CO2 26 25 25 22   GLUCOSE 180* 110* 107* 95  BUN 21 22 13  <5*  CREATININE 0.97 0.70 0.58 0.55  CALCIUM 10.0 8.0* 7.7* 8.1*  MG 1.9  --  1.9 1.8  PHOS 3.3  --   --   --    GFR: Estimated Creatinine Clearance: 71.7 mL/min (by C-G formula based on SCr of 0.55 mg/dL). Liver Function Tests: Recent Labs  Lab 05/07/20 0647  AST 31  ALT 21  ALKPHOS 85  BILITOT 0.9  PROT 9.0*  ALBUMIN 5.0   Recent Labs  Lab 05/07/20 0647  LIPASE 42   No results for input(s): AMMONIA in the last 168 hours. Coagulation Profile: No results for input(s): INR, PROTIME in the last 168  hours. Cardiac Enzymes: No results for input(s): CKTOTAL, CKMB, CKMBINDEX, TROPONINI in the last 168 hours. BNP (last 3 results) No results for input(s): PROBNP in the last 8760 hours. HbA1C: No results for input(s): HGBA1C in the last 72 hours. CBG: No results for input(s): GLUCAP in the last 168 hours. Lipid Profile: No results for input(s): CHOL, HDL, LDLCALC, TRIG, CHOLHDL, LDLDIRECT in the last 72 hours. Thyroid Function Tests: No results for input(s): TSH, T4TOTAL, FREET4, T3FREE, THYROIDAB in the last 72 hours. Anemia Panel: No results for input(s): VITAMINB12, FOLATE, FERRITIN, TIBC, IRON, RETICCTPCT in the last 72 hours. Urine analysis:    Component Value Date/Time   COLORURINE YELLOW 05/07/2020 0647   APPEARANCEUR HAZY (A) 05/07/2020 0647   LABSPEC 1.018 05/07/2020 [place] 5.0 05/07/2020 [place] 05/07/2020 [place] 05/07/2020 [place] 05/07/2020 7619  BILIRUBINUR neg 06/06/2015 1201   KETONESUR 5 (A) 05/07/2020 0647   PROTEINUR 30 (A) 05/07/2020 0647   UROBILINOGEN 0.2 06/06/2015 1201   UROBILINOGEN 0.2 11/22/2014 0929   NITRITE NEGATIVE 05/07/2020 0647   LEUKOCYTESUR NEGATIVE 05/07/2020 0647   Sepsis Labs: @LABRCNTIP (procalcitonin:4,lacticidven:4)  ) Recent Results (from the past 240 hour(s))  Respiratory Panel by RT PCR (Flu A&B, Covid) - Nasopharyngeal Swab     Status: None   Collection Time: 05/07/20  1:05 PM   Specimen: Nasopharyngeal Swab  Result Value Ref Range Status   SARS Coronavirus 2 by RT PCR NEGATIVE NEGATIVE Final    Comment: (NOTE) SARS-CoV-2 target nucleic acids are NOT DETECTED.  The SARS-CoV-2 RNA is generally detectable in upper respiratoy specimens during the acute phase of infection. The lowest concentration of SARS-CoV-2 viral copies this assay can detect is 131 copies/mL. A negative result does not preclude SARS-Cov-2 infection and should not be used as the sole basis for  treatment or other patient management decisions. A negative result may occur with  improper specimen collection/handling, submission of specimen other than nasopharyngeal swab, presence of viral mutation(s) within the areas targeted by this assay, and inadequate number of viral copies (<131 copies/mL). A negative result must be combined with clinical observations, patient history, and epidemiological information. The expected result is Negative.  Fact Sheet for Patients:  PinkCheek.be  Fact Sheet for Healthcare Providers:  GravelBags.it  This test is no t yet approved or cleared by the [place] FDA and  has been authorized for detection and/or diagnosis of SARS-CoV-2 by FDA under an Emergency Use Authorization (EUA). This EUA will remain  in effect (meaning this test can be used) for the duration of the COVID-19 declaration under Section 564(b)(1) of the Act, 21 U.S.C. section 360bbb-3(b)(1), unless the authorization is terminated or revoked sooner.     Influenza A by PCR NEGATIVE NEGATIVE Final   Influenza B by PCR NEGATIVE NEGATIVE Final    Comment: (NOTE) The Xpert Xpress SARS-CoV-2/FLU/RSV assay is intended as an aid in  the diagnosis of influenza from Nasopharyngeal swab specimens and  should not be used as a sole basis for treatment. Nasal washings and  aspirates are unacceptable for Xpert Xpress SARS-CoV-2/FLU/RSV  testing.  Fact Sheet for Patients: PinkCheek.be  Fact Sheet for Healthcare Providers: GravelBags.it  This test is not yet approved or cleared by the [place] FDA and  has been authorized for detection and/or diagnosis of SARS-CoV-2 by  FDA under an Emergency Use Authorization (EUA). This EUA will remain  in effect (meaning this test can be used) for the duration of the  Covid-19 declaration under Section 564(b)(1) of the Act, 21   U.S.C. section 360bbb-3(b)(1), unless the authorization is  terminated or revoked. Performed at [facility], [place] [address] [place], [place] [address]       Studies: DG CHEST PORT 1 VIEW  Result Date: 05/10/2020 CLINICAL DATA:  Cough. EXAM: PORTABLE CHEST 1 VIEW COMPARISON:  10/22/2017 FINDINGS: Is normal thoracolumbar scoliosis appears stable. The lungs are free of focal. Heart size consolidations and pleural effusions. No pulmonary edema. Chronic changes in the shoulders bilaterally. IMPRESSION: No active disease. Electronically Signed   By: Nolon Nations M.D.   On: 05/10/2020 09:23   DG Abd Portable 1V  Result Date: 05/10/2020 CLINICAL DATA:  Recent small-bowel obstruction EXAM: PORTABLE ABDOMEN - 1 VIEW COMPARISON:  May 09, 2020 FINDINGS: There remains a degree of small-bowel dilatation in the left abdomen with a  loop of small bowel measuring up to 4.7 cm in diameter. No air-fluid levels. No free air evident on supine examination. There is advanced arthropathy in the lumbar spine with marked scoliosis. There is advanced arthropathy in both hip joints, slightly more severe on the right than on the left. Visualized lung bases are clear. IMPRESSION: Suspect persistent degree of small-bowel obstruction. No free air evident on supine examination. Marked scoliosis in the lumbar region with advanced arthropathy in the lumbar spine and hip regions. Electronically Signed   By: Lowella Grip III M.D.   On: 05/10/2020 09:25    Scheduled Meds: . (feeding supplement) PROSource Plus  30 mL Oral BID BM  . Abatacept  125 mg Subcutaneous Q Mon  . allopurinol  100 mg Oral Daily  . amLODipine  5 mg Oral Daily  . vitamin C  500 mg Oral Daily  . aspirin EC  81 mg Oral Daily  . calcium carbonate  200 mg of elemental calcium Oral BID  . chlorhexidine  15 mL Mouth/Throat BID  . cholecalciferol  2,000 Units Oral Daily  . enoxaparin (LOVENOX) injection  40 mg Subcutaneous  Q24H  . feeding supplement  1 Container Oral BID BM  . folic acid  9,450 mcg Oral Daily  . guaiFENesin  1,200 mg Oral BID  . ipratropium-albuterol  3 mL Nebulization Q6H  . megestrol  400 mg Oral Daily  . multivitamin with minerals  1 tablet Oral Daily  . pantoprazole  40 mg Oral Daily  . sodium chloride flush  3 mL Intravenous Q12H  . vitamin B-12  500 mcg Oral Daily    Continuous Infusions: . sodium chloride 75 mL/hr at 05/10/20 0022     LOS: 3 days     Kayleen Memos, MD [facility] Pager [phone]  If 7PM-7AM, please contact night-coverage [email or web address] Password [email or web address] 05/10/2020, 10:40 AM

## 2020-05-10 NOTE — TOC Initial Note (Signed)
Transition of Care [facility]) - Initial/Assessment Note    Patient Details  Name: [name] MRN: [id number] Date of Birth: [birth year]  [facility]) CM/SW Contact:    Dessa Phi, RN Phone Number: 05/10/2020, 1:06 PM  Clinical Narrative:  [name] for [place].Await d/c.                 Expected Discharge Plan: [place] Barriers to Discharge: Continued Medical Work up   Patient Goals and CMS Choice   CMS Medicare.gov Compare Post Acute Care list provided to:: Patient Choice offered to / list presented to : Patient  Expected Discharge Plan and Services Expected Discharge Plan: [place]   Discharge Planning Services: CM Consult Post Acute Care Choice: [place] arrangements for the past 2 months: [place]: PT [place]: [place] Date [place]: 05/10/20 Time HH Agency Contacted: 1884 Representative spoke with at [place]: Tommi Rumps  Prior Living Arrangements/Services Living arrangements for the past 2 months: [place] Lives with:: Spouse Patient language and need for interpreter reviewed:: Yes Do you feel safe going back to the place where you live?: Yes      Need for Family Participation in Patient Care: No (Comment) Care giver support system in place?: Yes (comment)   Criminal Activity/Legal Involvement Pertinent to Current Situation/Hospitalization: No - Comment as needed  Activities of Daily Living Home Assistive Devices/Equipment: Built-in shower seat, Hearing aid, Raised toilet seat with rails, Walker (specify type) (2 wheel) ADL Screening (condition at time of admission) Patient's cognitive ability adequate to safely complete daily activities?: Yes Is the patient deaf or have difficulty hearing?: Yes Does the patient have difficulty seeing, even when wearing glasses/contacts?: Yes Does the patient have difficulty  concentrating, remembering, or making decisions?: No Patient able to express need for assistance with ADLs?: Yes Does the patient have difficulty dressing or bathing?: Yes Independently performs ADLs?: No Communication: Independent Dressing (OT): Needs assistance Is this a change from baseline?: Pre-admission baseline Grooming: Needs assistance Is this a change from baseline?: Pre-admission baseline Feeding: Needs assistance Is this a change from baseline?: Pre-admission baseline Bathing: Needs assistance Is this a change from baseline?: Pre-admission baseline Toileting: Needs assistance Is this a change from baseline?: Pre-admission baseline In/Out Bed: Needs assistance Is this a change from baseline?: Pre-admission baseline Walks in Home: Independent with device (comment) (walker ) Does the patient have difficulty walking or climbing stairs?: Yes Weakness of Legs: Both Weakness of Arms/Hands: Both  Permission Sought/Granted Permission sought to share information with : Case Manager Permission granted to share information with : Yes, Verbal Permission Granted  Share Information with NAME: Case Manager           Emotional Assessment Appearance:: Appears stated age   Affect (typically observed): Accepting Orientation: : Oriented to Self, Oriented to Place, Oriented to  Time, Oriented to Situation Alcohol / Substance Use: Not Applicable Psych Involvement: No (comment)  Admission diagnosis:  Small bowel obstruction (HCC) [K56.609] SBO (small bowel obstruction) ([place]) [K56.609] Generalized abdominal pain [R10.84] Patient Active Problem List   Diagnosis Date Noted  . SBO (small bowel obstruction) ([place]) 05/07/2020  . Left renal mass 10/23/2017  . Hx of adenomatous polyp of colon 11/13/2016  . 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  Chronic combined systolic and diastolic heart failure ([place])   . Ventral hernia without obstruction or gangrene 06/06/2015  . Renal  mass   . Pulmonary HTN ([place]) 01/01/2014  . Iron deficiency anemia 01/01/2014  . Atrial flutter ([place]) 12/31/2013  . Encounter for ostomy care education 11/30/2013  . Stoma dermatitis 11/30/2013  . Incisional hernia 11/30/2013  . Constipation, chronic 11/30/2013  . Charcot's joint of foot 07/07/2013  . Bilateral leg edema 04/20/2013  . 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 Rheumatoid arthritis ([place]) 06/07/2009  . THYROID NODULE 07/01/2007  . LUNG NODULE 07/01/2007  . Osteoporosis 05/07/2007  . 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 Blindness 04/12/2007  . HEMORRHOIDS, INTERNAL 04/12/2007  . 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 Hyperlipidemia 01/07/2007  . Essential hypertension 01/07/2007  . GERD 01/07/2007  . Marfan's syndrome 01/07/2007  . Stroke ([place]) 07/2006  . Chronic gout 2008   PCP:  [name], MD Pharmacy:   [facility] [place] [place] [address] Phone: [phone] Fax: [phone]  [facility] - [place], [place] [address] [place] [address] Phone: [phone] Fax: [phone]  [facility] [phone] [place], [place] - [address] [place] [address] Phone: [phone] Fax: [phone]     Social Determinants of Health (SDOH) Interventions    Readmission Risk Interventions No flowsheet data found.

## 2020-05-10 NOTE — Care Management Important Message (Signed)
Important Message  Patient Details IM Letter given to the Patient Name: [name] MRN: [id number] Date of Birth: 02-24-1956   Medicare Important Message Given:  Yes     Kerin Salen 05/10/2020, 10:47 AM

## 2020-05-10 NOTE — Progress Notes (Signed)
   Subjective/Chief Complaint: Denies pain or nausea. Feels full/ bloated after a couple cups of liquids. Having watery ostomy output.    Objective: Vital signs in last 24 hours: Temp:  [98.7 F (37.1 C)-99.1 F (37.3 C)] 98.7 F (37.1 C) (11/16 0508) Pulse Rate:  [80-85] 85 (11/16 0859) Resp:  [17-20] 17 (11/16 0859) BP: (129-147)/(78-91) 147/91 (11/16 0508) SpO2:  [98 %-100 %] 98 % (11/16 0859) Last BM Date: 05/09/20  Intake/Output from previous day: 11/15 0701 - 11/16 0700 In: 873.3 [P.O.:600; I.V.:273.3] Out: 1750 [Urine:1100; Stool:650] Intake/Output this shift: No intake/output data recorded.  General appearance: alert and cooperative Resp: unlabored Cardio: regular rate and rhythm Abd: Soft, mildly distended, nontender. Large LLQ hernia. Left sided ostomy with thin liquid stool and air in bag.  Skin: Skin color, texture, turgor normal. No rashes or lesions  Lab Results:  Recent Labs    05/09/20 0504 05/10/20 0551  WBC 6.3 6.1  HGB 10.3* 10.8*  HCT 32.4* 34.6*  PLT 200 219   BMET Recent Labs    05/09/20 0504 05/10/20 0551  NA 143 139  K 4.0 3.6  CL 112* 112*  CO2 25 22  GLUCOSE 107* 95  BUN 13 <5*  CREATININE 0.58 0.55  CALCIUM 7.7* 8.1*   PT/INR No results for input(s): LABPROT, INR in the last 72 hours. ABG No results for input(s): PHART, HCO3 in the last 72 hours.  Invalid input(s): PCO2, PO2  Studies/Results: DG Abd Portable 1V  Result Date: 05/09/2020 CLINICAL DATA:  Small bowel obstruction EXAM: PORTABLE ABDOMEN - 1 VIEW COMPARISON:  CT from 2 days ago FINDINGS: Continued dilated bowel seen in the left abdomen with a small bowel loop measuring 3-4 cm. No concerning mass effect or gas collection. Advanced hip osteoarthritis. Advanced lumbar spine degeneration with scoliosis. Atherosclerosis. IMPRESSION: Ongoing small bowel obstruction. Electronically Signed   By: Monte Fantasia M.D.   On: 05/09/2020 07:08     Anti-infectives: Anti-infectives (From admission, onward)   None      Assessment/Plan: Small bowel obstruction, likely secondary to adhesions, recurrent Large incisional hernia without incarceration             Clinically unchanged, maybe slightly more distended than yesterday, but having ostomy output. Continue liquids only today. If distention worsens or develops nausea will need NG and SBO protocol             Encouraged OOB, ambulation with assistance  LOS: 3 days    Clovis Riley 05/10/2020

## 2020-05-11 DIAGNOSIS — E440 Moderate protein-calorie malnutrition: Secondary | ICD-10-CM | POA: Insufficient documentation

## 2020-05-11 LAB — BASIC METABOLIC PANEL
Anion gap: 8 (ref 5–15)
BUN: <5 mg/dL — ABNORMAL LOW (ref 8–23)
CO2: 23 mmol/L (ref 22–32)
Calcium: 8.5 mg/dL — ABNORMAL LOW (ref 8.9–10.3)
Chloride: 109 mmol/L (ref 98–111)
Creatinine, Ser: 0.56 mg/dL (ref 0.44–1.00)
GFR, Estimated: >60 mL/min (ref >60)
Glucose, Bld: 95 mg/dL (ref 70–99)
Potassium: 3.4 mmol/L — ABNORMAL LOW (ref 3.5–5.1)
Sodium: 140 mmol/L (ref 135–145)

## 2020-05-11 LAB — CBC
HCT: 34.9 % — ABNORMAL LOW (ref 36.0–46.0)
Hemoglobin: 11.1 g/dL — ABNORMAL LOW (ref 12.0–15.0)
MCH: 31 pg (ref 26.0–34.0)
MCHC: 31.8 g/dL (ref 30.0–36.0)
MCV: 97.5 fL (ref 80.0–100.0)
Platelets: 240 10*3/uL (ref 150–400)
RBC: 3.58 MIL/uL — ABNORMAL LOW (ref 3.87–5.11)
RDW: 13.2 % (ref 11.5–15.5)
WBC: 7.6 10*3/uL (ref 4.0–10.5)
nRBC: 0 % (ref 0.0–0.2)

## 2020-05-11 LAB — MAGNESIUM: Magnesium: 1.7 mg/dL (ref 1.7–2.4)

## 2020-05-11 MED ORDER — TRAMADOL HCL 50 MG PO TABS
50.0000 mg | ORAL_TABLET | Freq: Two times a day (BID) | ORAL | Status: DC | PRN
  Administered 2020-05-11: 50 mg via ORAL
  Filled 2020-05-11: qty 1

## 2020-05-11 MED ORDER — POTASSIUM CHLORIDE 10 MEQ/100ML IV SOLN
10.0000 meq | INTRAVENOUS | Status: AC
  Administered 2020-05-11 (×5): 10 meq via INTRAVENOUS
  Filled 2020-05-11: qty 100

## 2020-05-11 MED ORDER — MAGNESIUM SULFATE 50 % IJ SOLN
3.0000 g | Freq: Once | INTRAVENOUS | Status: AC
  Administered 2020-05-11: 3 g via INTRAVENOUS
  Filled 2020-05-11: qty 6

## 2020-05-11 NOTE — Progress Notes (Signed)
PROGRESS NOTE    JAYDIN JALOMO  SNK:[id number] DOB: December 21, 1955 DOA: 05/07/2020 PCP: [name], MD    Brief Narrative:  64 year old female with a history of atrial flutter not on [place], diverticulitis with perforation s/p colectomy/ostomy in 2014, marfan's syndrome, RA, blindness, papillary renal cell CA, chronic constipation, recurrent SBO who presented to [facility] ED with abdominal pain, nausea and vomiting x1 day. States this is similar to prior bowel obstructions but with the last few she waited out at homeuntil it resolved. Last night her pain became so severe and she was unable to keep any of her medications down which prompted her to come to the ED. still with stool output in her colostomy, was last emptied on Wednesday 05/04/20.  ED Course: CT abdomen pelvis w contrast: mild SBO likely due to adhesion or internal hernia, large wide mouthed suprapubic ventral hernia containing multiple small bowel loops and transverse colon without evidence of strangulation   Assessment & Plan:   Principal Problem:   SBO (small bowel obstruction) (HCC) Active Problems:   Blindness   Essential hypertension   Atrial flutter (HCC)   Chronic combined systolic and diastolic heart failure (HCC)   Malnutrition of moderate degree   SBO, persistent, with transition point in the left lower abdomen, possibly from adhesions in the setting of large ventral hernia and hx of abdominal surgery, post colectomy with colostomy a. Clinically improving, started on clear by surgery on 05/09/2020 with diet advanced to Fulls on 11/17 b. Monitor electrolytes and replace as indicated c. Cont to replace lytes to keep K over 4 and Mg over 2 d. Therapy recs for [facility] PT noted e. Repeat lytes in AM  Semi Productive cough, possibly from GERD No leukocytosis this AM No evidence of active infective process Recent chest x-ray no active disease. Currently on minimal O2 support  Essential hypertension Continue home  norvasc 5 mg daily Continue to monitor vital signs BP stable at this time  Chronic diastolic CHF Euvolemic on exam this AM, no LE edema Last 2D echo done on 03/30/2019 showed normal LVEF 60 to 65%. Continue to closely monitor volume status while on IV fluid.  History of a flutter/A. fib status post ablation in 2015 Currently in sinus rhythm. Continue to monitor on telemetry.  Chronic large ventral hernia, no evidence of strangulation on CT scan. Continue to monitor Surgery cont to follow, per above  Resolved leukocytosis, likely reactive in the setting of SBO.   No evidence of active infective process Leukocytosis has resolved.   Afebrile.  Nontoxic-appearing.  RA stable Resume home regimen  GERD Stable Continue home regimen as tolerated  History of Marfan's  Gout/blindness Continue home regimen   DVT prophylaxis: Lovenox subq Code Status: Full Family Communication: Pt in room, family not at bedside  Status is: Inpatient  Remains inpatient appropriate because:Inpatient level of care appropriate due to severity of illness   Dispo:  Patient From: Home  Planned Disposition: Home with Health Care Svc  Expected discharge date: 05/13/20  Medically stable for discharge: No        Consultants:   General Surgery  Procedures:     Antimicrobials: Anti-infectives (From admission, onward)   None       Subjective: Reports feeling better. Eager to advance diet today  Objective: Vitals:   05/10/20 2027 05/11/20 0552 05/11/20 0556 05/11/20 1240  BP: 125/80 (!) 174/86 (!) 149/70 (!) 147/80  Pulse: 87 70 61 67  Resp:  18 18 16  Temp: 98.9 F (37.2 C) 98.9 F (37.2 C) 98.4 F (36.9 C) 98.2 F (36.8 C)  TempSrc: Oral Oral Oral Oral  SpO2: 94% 96% 99% 99%  Weight:      Height:        Intake/Output Summary (Last 24 hours) at 05/11/2020 1312 Last data filed at 05/11/2020 0457 Gross per 24 hour  Intake 470 ml  Output 1025 ml  Net -555 ml    Filed Weights   05/07/20 0645 05/07/20 1906  Weight: 61.2 kg 69.9 kg    Examination:  General exam: Appears calm and comfortable  Respiratory system: Clear to auscultation. Respiratory effort normal. Cardiovascular system: S1 & S2 heard, Regular Gastrointestinal system: Abdomen is nondistended, soft and nontender. No organomegaly or masses felt. Normal bowel sounds heard. Central nervous system: Alert and oriented. No focal neurological deficits. Extremities: Symmetric 5 x 5 power. Skin: No rashes, lesions  Psychiatry: Judgement and insight appear normal. Mood & affect appropriate.   Data Reviewed: I have personally reviewed following labs and imaging studies  CBC: Recent Labs  Lab 05/07/20 0647 05/08/20 0555 05/09/20 0504 05/10/20 0551 05/11/20 0531  WBC 14.5* 6.3 6.3 6.1 7.6  HGB 15.3* 11.5* 10.3* 10.8* 11.1*  HCT 47.1* 36.2 32.4* 34.6* 34.9*  MCV 96.5 98.4 99.4 98.6 97.5  PLT 347 252 200 219 119   Basic Metabolic Panel: Recent Labs  Lab 05/07/20 0647 05/08/20 0555 05/09/20 0504 05/10/20 0551 05/11/20 0531  NA 139 143 143 139 140  K 4.3 3.6 4.0 3.6 3.4*  CL 99 108 112* 112* 109  CO2 26 25 25 22 23   GLUCOSE 180* 110* 107* 95 95  BUN 21 22 13  <5* <5*  CREATININE 0.97 0.70 0.58 0.55 0.56  CALCIUM 10.0 8.0* 7.7* 8.1* 8.5*  MG 1.9  --  1.9 1.8 1.7  PHOS 3.3  --   --   --   --    GFR: Estimated Creatinine Clearance: 71.7 mL/min (by C-G formula based on SCr of 0.56 mg/dL). Liver Function Tests: Recent Labs  Lab 05/07/20 0647  AST 31  ALT 21  ALKPHOS 85  BILITOT 0.9  PROT 9.0*  ALBUMIN 5.0   Recent Labs  Lab 05/07/20 0647  LIPASE 42   No results for input(s): AMMONIA in the last 168 hours. Coagulation Profile: No results for input(s): INR, PROTIME in the last 168 hours. Cardiac Enzymes: No results for input(s): CKTOTAL, CKMB, CKMBINDEX, TROPONINI in the last 168 hours. BNP (last 3 results) No results for input(s): PROBNP in the last 8760  hours. HbA1C: No results for input(s): HGBA1C in the last 72 hours. CBG: No results for input(s): GLUCAP in the last 168 hours. Lipid Profile: No results for input(s): CHOL, HDL, LDLCALC, TRIG, CHOLHDL, LDLDIRECT in the last 72 hours. Thyroid Function Tests: No results for input(s): TSH, T4TOTAL, FREET4, T3FREE, THYROIDAB in the last 72 hours. Anemia Panel: No results for input(s): VITAMINB12, FOLATE, FERRITIN, TIBC, IRON, RETICCTPCT in the last 72 hours. Sepsis Labs: No results for input(s): PROCALCITON, LATICACIDVEN in the last 168 hours.  Recent Results (from the past 240 hour(s))  Respiratory Panel by RT PCR (Flu A&B, Covid) - Nasopharyngeal Swab     Status: None   Collection Time: 05/07/20  1:05 PM   Specimen: Nasopharyngeal Swab  Result Value Ref Range Status   SARS Coronavirus 2 by RT PCR NEGATIVE NEGATIVE Final    Comment: (NOTE) SARS-CoV-2 target nucleic acids are NOT DETECTED.  The SARS-CoV-2 RNA is generally detectable  in upper respiratoy specimens during the acute phase of infection. The lowest concentration of SARS-CoV-2 viral copies this assay can detect is 131 copies/mL. A negative result does not preclude SARS-Cov-2 infection and should not be used as the sole basis for treatment or other patient management decisions. A negative result may occur with  improper specimen collection/handling, submission of specimen other than nasopharyngeal swab, presence of viral mutation(s) within the areas targeted by this assay, and inadequate number of viral copies (<131 copies/mL). A negative result must be combined with clinical observations, patient history, and epidemiological information. The expected result is Negative.  Fact Sheet for Patients:  PinkCheek.be  Fact Sheet for Healthcare Providers:  GravelBags.it  This test is no t yet approved or cleared by the [place] FDA and  has been authorized for  detection and/or diagnosis of SARS-CoV-2 by FDA under an Emergency Use Authorization (EUA). This EUA will remain  in effect (meaning this test can be used) for the duration of the COVID-19 declaration under Section 564(b)(1) of the Act, 21 U.S.C. section 360bbb-3(b)(1), unless the authorization is terminated or revoked sooner.     Influenza A by PCR NEGATIVE NEGATIVE Final   Influenza B by PCR NEGATIVE NEGATIVE Final    Comment: (NOTE) The Xpert Xpress SARS-CoV-2/FLU/RSV assay is intended as an aid in  the diagnosis of influenza from Nasopharyngeal swab specimens and  should not be used as a sole basis for treatment. Nasal washings and  aspirates are unacceptable for Xpert Xpress SARS-CoV-2/FLU/RSV  testing.  Fact Sheet for Patients: PinkCheek.be  Fact Sheet for Healthcare Providers: GravelBags.it  This test is not yet approved or cleared by the [place] FDA and  has been authorized for detection and/or diagnosis of SARS-CoV-2 by  FDA under an Emergency Use Authorization (EUA). This EUA will remain  in effect (meaning this test can be used) for the duration of the  Covid-19 declaration under Section 564(b)(1) of the Act, 21  U.S.C. section 360bbb-3(b)(1), unless the authorization is  terminated or revoked. Performed at [facility], [place] [address] [place], [place] [address]      Radiology Studies: DG CHEST PORT 1 VIEW  Result Date: 05/10/2020 CLINICAL DATA:  Cough. EXAM: PORTABLE CHEST 1 VIEW COMPARISON:  10/22/2017 FINDINGS: Is normal thoracolumbar scoliosis appears stable. The lungs are free of focal. Heart size consolidations and pleural effusions. No pulmonary edema. Chronic changes in the shoulders bilaterally. IMPRESSION: No active disease. Electronically Signed   By: Nolon Nations M.D.   On: 05/10/2020 09:23   DG Abd Portable 1V  Result Date: 05/10/2020 CLINICAL DATA:  Recent  small-bowel obstruction EXAM: PORTABLE ABDOMEN - 1 VIEW COMPARISON:  May 09, 2020 FINDINGS: There remains a degree of small-bowel dilatation in the left abdomen with a loop of small bowel measuring up to 4.7 cm in diameter. No air-fluid levels. No free air evident on supine examination. There is advanced arthropathy in the lumbar spine with marked scoliosis. There is advanced arthropathy in both hip joints, slightly more severe on the right than on the left. Visualized lung bases are clear. IMPRESSION: Suspect persistent degree of small-bowel obstruction. No free air evident on supine examination. Marked scoliosis in the lumbar region with advanced arthropathy in the lumbar spine and hip regions. Electronically Signed   By: Lowella Grip III M.D.   On: 05/10/2020 09:25    Scheduled Meds: . (feeding supplement) PROSource Plus  30 mL Oral BID BM  . Abatacept  125 mg Subcutaneous  Q Mon  . allopurinol  100 mg Oral Daily  . amLODipine  5 mg Oral Daily  . vitamin C  500 mg Oral Daily  . aspirin EC  81 mg Oral Daily  . calcium carbonate  200 mg of elemental calcium Oral BID  . chlorhexidine  15 mL Mouth/Throat BID  . cholecalciferol  2,000 Units Oral Daily  . enoxaparin (LOVENOX) injection  40 mg Subcutaneous Q24H  . feeding supplement  1 Container Oral BID BM  . folic acid  8,185 mcg Oral Daily  . guaiFENesin  1,200 mg Oral BID  . ipratropium-albuterol  3 mL Nebulization BID  . megestrol  400 mg Oral Daily  . multivitamin with minerals  1 tablet Oral Daily  . pantoprazole  40 mg Oral Daily  . sodium chloride flush  3 mL Intravenous Q12H  . tiZANidine  4 mg Oral BID  . vitamin B-12  500 mcg Oral Daily   Continuous Infusions: . sodium chloride 75 mL/hr at 05/11/20 0939  . potassium chloride 10 mEq (05/11/20 1258)     LOS: 4 days   Marylu Lund, MD [facility] Pager On Amion  If 7PM-7AM, please contact night-coverage 05/11/2020, 1:12 PM

## 2020-05-11 NOTE — Progress Notes (Signed)
Subjective: Patient feels better today.  No further nausea.  Bag was full recently and just had to be emptied.  Mostly liquid currently.  ROS: See above, otherwise other systems negative  Objective: Vital signs in last 24 hours: Temp:  [98.4 F (36.9 C)-98.9 F (37.2 C)] 98.4 F (36.9 C) (11/17 0556) Pulse Rate:  [61-87] 61 (11/17 0556) Resp:  [17-18] 18 (11/17 0556) BP: (118-174)/(70-86) 149/70 (11/17 0556) SpO2:  [94 %-99 %] 99 % (11/17 0556) Last BM Date: 05/10/20  Intake/Output from previous day: 11/16 0701 - 11/17 0700 In: 470 [I.V.:470] Out: 1375 [Urine:1050; Stool:325] Intake/Output this shift: No intake/output data recorded.  PE: Abd: soft, NT, ND, chronic ventral hernia stable, colostomy present with liquid output in the bag.  Stoma is pink and viable.  Lab Results:  Recent Labs    05/10/20 0551 05/11/20 0531  WBC 6.1 7.6  HGB 10.8* 11.1*  HCT 34.6* 34.9*  PLT 219 240   BMET Recent Labs    05/10/20 0551 05/11/20 0531  NA 139 140  K 3.6 3.4*  CL 112* 109  CO2 22 23  GLUCOSE 95 95  BUN <5* <5*  CREATININE 0.55 0.56  CALCIUM 8.1* 8.5*   PT/INR No results for input(s): LABPROT, INR in the last 72 hours. CMP     Component Value Date/Time   NA 140 05/11/2020 0531   NA 143 09/10/2016 0000   K 3.4 (L) 05/11/2020 0531   CL 109 05/11/2020 0531   CO2 23 05/11/2020 0531   GLUCOSE 95 05/11/2020 0531   BUN <5 (L) 05/11/2020 0531   BUN 14 09/10/2016 0000   CREATININE 0.56 05/11/2020 0531   CREATININE 0.76 11/19/2017 1024   CALCIUM 8.5 (L) 05/11/2020 0531   PROT 9.0 (H) 05/07/2020 0647   ALBUMIN 5.0 05/07/2020 0647   AST 31 05/07/2020 0647   ALT 21 05/07/2020 0647   ALKPHOS 85 05/07/2020 0647   BILITOT 0.9 05/07/2020 0647   GFRNONAA >60 05/11/2020 0531   GFRNONAA 85 11/19/2017 1024   GFRAA 98 11/19/2017 1024   Lipase     Component Value Date/Time   LIPASE 42 05/07/2020 0647       Studies/Results: DG CHEST PORT 1 VIEW  Result  Date: 05/10/2020 CLINICAL DATA:  Cough. EXAM: PORTABLE CHEST 1 VIEW COMPARISON:  10/22/2017 FINDINGS: Is normal thoracolumbar scoliosis appears stable. The lungs are free of focal. Heart size consolidations and pleural effusions. No pulmonary edema. Chronic changes in the shoulders bilaterally. IMPRESSION: No active disease. Electronically Signed   By: Nolon Nations M.D.   On: 05/10/2020 09:23   DG Abd Portable 1V  Result Date: 05/10/2020 CLINICAL DATA:  Recent small-bowel obstruction EXAM: PORTABLE ABDOMEN - 1 VIEW COMPARISON:  May 09, 2020 FINDINGS: There remains a degree of small-bowel dilatation in the left abdomen with a loop of small bowel measuring up to 4.7 cm in diameter. No air-fluid levels. No free air evident on supine examination. There is advanced arthropathy in the lumbar spine with marked scoliosis. There is advanced arthropathy in both hip joints, slightly more severe on the right than on the left. Visualized lung bases are clear. IMPRESSION: Suspect persistent degree of small-bowel obstruction. No free air evident on supine examination. Marked scoliosis in the lumbar region with advanced arthropathy in the lumbar spine and hip regions. Electronically Signed   By: Lowella Grip III M.D.   On: 05/10/2020 09:25    Anti-infectives: Anti-infectives (From admission, onward)  None       Assessment/Plan Large incisional hernia without incarceration  Recurrently SBO, likely secondary to adhesive disease -improving today with good output in her colostomy bag and no nausea with CLD yesterday -adv to FLD today and see how she does. -mobilize as she is able  FEN - FLD, IVFs VTE - Lovenox ID - none currently   LOS: 4 days    [name] , [facility] Surgery 05/11/2020, 8:28 AM Please see Amion for pager number during day hours 7:00am-4:30pm or 7:00am -11:30am on weekends

## 2020-05-12 LAB — COMPREHENSIVE METABOLIC PANEL
ALT: 17 U/L (ref 0–44)
AST: 19 U/L (ref 15–41)
Albumin: 3.4 g/dL — ABNORMAL LOW (ref 3.5–5.0)
Alkaline Phosphatase: 53 U/L (ref 38–126)
Anion gap: 9 (ref 5–15)
BUN: <5 mg/dL — ABNORMAL LOW (ref 8–23)
CO2: 22 mmol/L (ref 22–32)
Calcium: 8.1 mg/dL — ABNORMAL LOW (ref 8.9–10.3)
Chloride: 107 mmol/L (ref 98–111)
Creatinine, Ser: 0.51 mg/dL (ref 0.44–1.00)
GFR, Estimated: >60 mL/min (ref >60)
Glucose, Bld: 90 mg/dL (ref 70–99)
Potassium: 3.8 mmol/L (ref 3.5–5.1)
Sodium: 138 mmol/L (ref 135–145)
Total Bilirubin: 0.7 mg/dL (ref 0.3–1.2)
Total Protein: 6.1 g/dL — ABNORMAL LOW (ref 6.5–8.1)

## 2020-05-12 LAB — CBC
HCT: 35.2 % — ABNORMAL LOW (ref 36.0–46.0)
Hemoglobin: 11.1 g/dL — ABNORMAL LOW (ref 12.0–15.0)
MCH: 31.3 pg (ref 26.0–34.0)
MCHC: 31.5 g/dL (ref 30.0–36.0)
MCV: 99.2 fL (ref 80.0–100.0)
Platelets: 223 10*3/uL (ref 150–400)
RBC: 3.55 MIL/uL — ABNORMAL LOW (ref 3.87–5.11)
RDW: 13.1 % (ref 11.5–15.5)
WBC: 8.5 10*3/uL (ref 4.0–10.5)
nRBC: 0 % (ref 0.0–0.2)

## 2020-05-12 LAB — MAGNESIUM: Magnesium: 1.9 mg/dL (ref 1.7–2.4)

## 2020-05-12 MED ORDER — POLYETHYLENE GLYCOL 3350 17 G PO PACK: 17.0000 g | PACK | Freq: Every day | ORAL | Status: DC

## 2020-05-12 NOTE — Discharge Summary (Signed)
Physician Discharge Summary  JELINA PAULSEN JGG:[id number] DOB: 04-May-1956 DOA: 05/07/2020  PCP: [name], MD  Admit date: 05/07/2020 Discharge date: 05/12/2020  Admitted From: Home Disposition:  Home  Recommendations for Outpatient Follow-up:  1. Follow up with PCP in 2-3 weeks  Discharge Condition:Improved CODE STATUS:Full Diet recommendation: Soft    Brief/Interim Summary: 64 year old female with a history of atrial flutter not on [place], diverticulitis with perforation s/p colectomy/ostomy in 2014, marfan's syndrome, RA, blindness, papillary renal cell CA, chronic constipation, recurrent SBO who presented to [facility] ED with abdominal pain, nausea and vomiting x1 day. States this is similar to prior bowel obstructions but with the last few she waited out at homeuntil it resolved. Abd pain became worse and pt ultimately presented to the ED where pt was found to have SBO likely secondary to adhesions  Discharge Diagnoses:  Principal Problem:   SBO (small bowel obstruction) ([place]) Active Problems:   Blindness   Essential hypertension   Atrial flutter (HCC)   Chronic combined systolic and diastolic heart failure (HCC)   Malnutrition of moderate degree  SBO, persistent, with transition point in the left lower abdomen, possibly from adhesions in the setting of large ventral hernia and hx of abdominal surgery, post colectomy with colostomy a. Clinically improving, started on clear by surgeryon 05/09/2020 with diet advanced to Fulls on 11/17 and soft diet on 11/8 b. Therapy recs for [facility] PT noted c. General Surgery recommends continuing soft diet x 1-2 weeks  SemiProductive cough, possibly from GERD No leukocytosis noted No evidence of active infective process Recent chest x-ray no active disease. On minimal O2 support  Essential hypertension Continuehome norvasc 5 mg daily BP stable at this time  Chronic diastolic CHF Euvolemic on exam this AM, no LE edema Last 2D echo  done on 03/30/2019 showed normal LVEF 60 to 65%.  History of a flutter/A. fib status post ablation in 2015 Currently in sinus rhythm. Continue to monitor on telemetry.  Chronic large ventral hernia, no evidence of strangulation on CT scan. Continue to monitor Surgery cont to follow, per above  Resolved leukocytosis, likely reactive in the setting of SBO.  No evidence of active infective process Leukocytosis has resolved. Afebrile. Nontoxic-appearing.  RA stable Resume home regimen  GERD Stable Continue home regimen as tolerated  History of Marfan's  Gout/blindness Continuehome regimen   Discharge Instructions   Allergies as of 05/12/2020      Reactions   Spinach Itching, Rash, Other (See Comments)   Welts.      Medication List    TAKE these medications   albuterol 108 (90 Base) MCG/ACT inhaler Commonly known as: VENTOLIN HFA Inhale 1 puff into the lungs every 4 (four) hours as needed for wheezing or shortness of breath.   allopurinol 100 MG tablet Commonly known as: ZYLOPRIM TAKE 1 TABLET BY MOUTH EVERY DAY What changed: additional instructions   amLODipine 10 MG tablet Commonly known as: NORVASC TAKE 1 TABLET BY MOUTH EVERY DAY What changed: additional instructions   aspirin EC 81 MG tablet Take 1 tablet (81 mg total) by mouth daily.   CALCIUM PO Take 1 tablet by mouth 2 (two) times daily.   cetirizine 10 MG tablet Commonly known as: ZYRTEC Take 1 tablet (10 mg total) by mouth daily.   Ciprodex OTIC suspension Generic drug: ciprofloxacin-dexamethasone INSTILL 4 DROPS UNTO EACH EAR TWICE DAILY AS NEEDED What changed: See the new instructions.   denosumab 60 MG/ML Sosy injection Commonly known as:  PROLIA Inject 60 mg into the skin every 6 (six) months.   diclofenac sodium 1 % Gel Commonly known as: VOLTAREN Apply 1 application topically 4 (four) times daily as needed (arthritis).   docusate sodium 100 MG capsule Commonly known  as: COLACE Take 100 mg by mouth 2 (two) times daily.   esomeprazole 40 MG capsule Commonly known as: NEXIUM TAKE 1 CAPSULE(40 MG) BY MOUTH DAILY What changed: See the new instructions.   folic acid 300 MCG tablet Commonly known as: FOLVITE Take 400 mcg by mouth daily.   HYDROcodone-acetaminophen 10-325 MG tablet Commonly known as: NORCO Take 1 tablet by mouth every 6 (six) hours as needed for moderate pain. Start taking on: June 20, 2020   megestrol 625 MG/5ML suspension Commonly known as: MEGACE ES take 5 milliliters by mouth three times daily before meals as needed for appetite   methotrexate (PF) 50 MG/2ML injection Inject 0.5 mLs into the muscle every Monday.   multivitamin with minerals Tabs tablet Take 1 tablet by mouth daily.   Orencia 125 MG/ML Sosy Generic drug: Abatacept Inject 125 mg into the skin every Monday.   Peridex 0.12 % solution Generic drug: chlorhexidine Use as directed 15 mLs in the mouth or throat 2 (two) times daily.   polyethylene glycol 17 g packet Commonly known as: MIRALAX / GLYCOLAX Take 17 g by mouth daily as needed for mild constipation.   tiZANidine 4 MG tablet Commonly known as: ZANAFLEX Take 1 tablet (4 mg total) by mouth 2 (two) times daily.   traMADol 50 MG tablet Commonly known as: ULTRAM TAKE 1 TO 2 TABLETS BY MOUTH EVERY 6 HOURS AS NEEDED What changed:   how much to take  how to take this  when to take this  additional instructions   vitamin B-12 500 MCG tablet Commonly known as: CYANOCOBALAMIN Take 500 mcg by mouth daily.   vitamin C 500 MG tablet Commonly known as: ASCORBIC ACID Take 500 mg by mouth daily.   Vitamin D 50 MCG (2000 UT) Caps Take 2,000 Units by mouth daily.       Follow-up Information    Care, [facility] Follow up.   Specialty: [place] Why: [facility] physical therapy Contact information: [place] [address] [phone]        [name], MD. Schedule an appointment as soon as possible for a visit in 2 week(s).   Specialty: Family Medicine Contact information: [place] [address] [phone]        Belva Crome, MD .   Specialty: Cardiology Contact information: [phone] [place] [address] [phone]              Allergies  Allergen Reactions  . Spinach Itching, Rash and Other (See Comments)    Welts.    Consultations:  General Surgery  Procedures/Studies: CT Abdomen Pelvis W Contrast  Result Date: 05/07/2020 CLINICAL DATA:  Abdominal pain and nausea and vomiting. Underwent colonoscopy yesterday. EXAM: CT ABDOMEN AND PELVIS WITH CONTRAST TECHNIQUE: Multidetector CT imaging of the abdomen and pelvis was performed using the standard protocol following bolus administration of intravenous contrast. CONTRAST:  12mL OMNIPAQUE IOHEXOL 300 MG/ML  SOLN COMPARISON:  09/14/2019 from [facility] FINDINGS: Lower Chest: No acute findings. Hepatobiliary: No hepatic masses identified. Several hepatic cysts again noted. Gallbladder is unremarkable. No evidence of biliary ductal dilatation. Pancreas:  No mass or inflammatory changes. Spleen: Within  normal limits in size and appearance. Adrenals/Urinary Tract: Renal parenchymal scarring is again seen involving left kidney greater than right. A subtle low-attenuation lesion is again seen in the anterior interpolar region of the right kidney. This measures 9 mm on image 28/, and is too small to characterize, but shows no significant change compared to prior studies. No evidence of ureteral calculi or hydronephrosis. Stomach/Bowel: Descending colostomy is again seen in the left lower quadrant. Mild colonic diverticulosis is noted, however there is no evidence of diverticulitis. Moderately dilated fluid-filled duodenum and jejunum is seen, with a transition point in the left lower abdomen (image 49/2). No  mass or inflammatory process is seen at this site in this may be due to adhesion or internal hernia. A large wide-mouthed midline suprapubic ventral hernia is seen containing multiple small bowel loops and transverse colon, however there is no evidence of strangulation. No evidence of inflammatory process, abscess, or free intraperitoneal air. Vascular/Lymphatic: No pathologically enlarged lymph nodes. No abdominal aortic aneurysm. Aortic atherosclerotic calcification noted. Reproductive:  No mass or other significant abnormality. Other:  None. Musculoskeletal: No suspicious bone lesions identified. Severe thoracolumbar rotatory levoscoliosis and degenerative changes noted. Severe bilateral hip osteoarthritis also seen. IMPRESSION: Mid small bowel obstruction, with transition point in the left lower abdomen, likely due to adhesion or internal hernia. Large wide-mouthed suprapubic ventral hernia containing multiple small bowel loops and transverse colon. No evidence of strangulation. Colonic diverticulosis, without radiographic evidence of diverticulitis. Aortic Atherosclerosis (ICD10-I70.0). Electronically Signed   By: Marlaine Hind M.D.   On: 05/07/2020 12:08   DG CHEST PORT 1 VIEW  Result Date: 05/10/2020 CLINICAL DATA:  Cough. EXAM: PORTABLE CHEST 1 VIEW COMPARISON:  10/22/2017 FINDINGS: Is normal thoracolumbar scoliosis appears stable. The lungs are free of focal. Heart size consolidations and pleural effusions. No pulmonary edema. Chronic changes in the shoulders bilaterally. IMPRESSION: No active disease. Electronically Signed   By: Nolon Nations M.D.   On: 05/10/2020 09:23   DG Abd Portable 1V  Result Date: 05/10/2020 CLINICAL DATA:  Recent small-bowel obstruction EXAM: PORTABLE ABDOMEN - 1 VIEW COMPARISON:  May 09, 2020 FINDINGS: There remains a degree of small-bowel dilatation in the left abdomen with a loop of small bowel measuring up to 4.7 cm in diameter. No air-fluid levels. No free air  evident on supine examination. There is advanced arthropathy in the lumbar spine with marked scoliosis. There is advanced arthropathy in both hip joints, slightly more severe on the right than on the left. Visualized lung bases are clear. IMPRESSION: Suspect persistent degree of small-bowel obstruction. No free air evident on supine examination. Marked scoliosis in the lumbar region with advanced arthropathy in the lumbar spine and hip regions. Electronically Signed   By: Lowella Grip III M.D.   On: 05/10/2020 09:25   DG Abd Portable 1V  Result Date: 05/09/2020 CLINICAL DATA:  Small bowel obstruction EXAM: PORTABLE ABDOMEN - 1 VIEW COMPARISON:  CT from 2 days ago FINDINGS: Continued dilated bowel seen in the left abdomen with a small bowel loop measuring 3-4 cm. No concerning mass effect or gas collection. Advanced hip osteoarthritis. Advanced lumbar spine degeneration with scoliosis. Atherosclerosis. IMPRESSION: Ongoing small bowel obstruction. Electronically Signed   By: Monte Fantasia M.D.   On: 05/09/2020 07:08     Subjective: Eager to go home today  Discharge Exam: Vitals:   05/12/20 0500 05/12/20 0745  BP: (!) 155/82 (!) 177/90  Pulse: 79 70  Resp: 18 16  Temp: 100.3 F (37.9  C) 98.4 F (36.9 C)  SpO2: 100% 93%   Vitals:   05/11/20 2036 05/11/20 2048 05/12/20 0500 05/12/20 0745  BP:  (!) 150/89 (!) 155/82 (!) 177/90  Pulse:  88 79 70  Resp:  19 18 16   Temp:  98.8 F (37.1 C) 100.3 F (37.9 C) 98.4 F (36.9 C)  TempSrc:  Oral Oral Oral  SpO2: 98% 94% 100% 93%  Weight:      Height:        General: Pt is alert, awake, not in acute distress Cardiovascular: RRR, S1/S2 +, no rubs, no gallops Respiratory: CTA bilaterally, no wheezing, no rhonchi Abdominal: Soft, NT, ND, bowel sounds + Extremities: no edema, no cyanosis   The results of significant diagnostics from this hospitalization (including imaging, microbiology, ancillary and laboratory) are listed below for  reference.     Microbiology: Recent Results (from the past 240 hour(s))  Respiratory Panel by RT PCR (Flu A&B, Covid) - Nasopharyngeal Swab     Status: None   Collection Time: 05/07/20  1:05 PM   Specimen: Nasopharyngeal Swab  Result Value Ref Range Status   SARS Coronavirus 2 by RT PCR NEGATIVE NEGATIVE Final    Comment: (NOTE) SARS-CoV-2 target nucleic acids are NOT DETECTED.  The SARS-CoV-2 RNA is generally detectable in upper respiratoy specimens during the acute phase of infection. The lowest concentration of SARS-CoV-2 viral copies this assay can detect is 131 copies/mL. A negative result does not preclude SARS-Cov-2 infection and should not be used as the sole basis for treatment or other patient management decisions. A negative result may occur with  improper specimen collection/handling, submission of specimen other than nasopharyngeal swab, presence of viral mutation(s) within the areas targeted by this assay, and inadequate number of viral copies (<131 copies/mL). A negative result must be combined with clinical observations, patient history, and epidemiological information. The expected result is Negative.  Fact Sheet for Patients:  PinkCheek.be  Fact Sheet for Healthcare Providers:  GravelBags.it  This test is no t yet approved or cleared by the [place] FDA and  has been authorized for detection and/or diagnosis of SARS-CoV-2 by FDA under an Emergency Use Authorization (EUA). This EUA will remain  in effect (meaning this test can be used) for the duration of the COVID-19 declaration under Section 564(b)(1) of the Act, 21 U.S.C. section 360bbb-3(b)(1), unless the authorization is terminated or revoked sooner.     Influenza A by PCR NEGATIVE NEGATIVE Final   Influenza B by PCR NEGATIVE NEGATIVE Final    Comment: (NOTE) The Xpert Xpress SARS-CoV-2/FLU/RSV assay is intended as an aid in  the diagnosis  of influenza from Nasopharyngeal swab specimens and  should not be used as a sole basis for treatment. Nasal washings and  aspirates are unacceptable for Xpert Xpress SARS-CoV-2/FLU/RSV  testing.  Fact Sheet for Patients: PinkCheek.be  Fact Sheet for Healthcare Providers: GravelBags.it  This test is not yet approved or cleared by the [place] FDA and  has been authorized for detection and/or diagnosis of SARS-CoV-2 by  FDA under an Emergency Use Authorization (EUA). This EUA will remain  in effect (meaning this test can be used) for the duration of the  Covid-19 declaration under Section 564(b)(1) of the Act, 21  U.S.C. section 360bbb-3(b)(1), unless the authorization is  terminated or revoked. Performed at [facility], [place] [address] [place], [place] [address]      Labs: BNP (last 3 results) No results for input(s): BNP in the  last 8760 hours. Basic Metabolic Panel: Recent Labs  Lab 05/07/20 0647 05/07/20 0647 05/08/20 0555 05/09/20 0504 05/10/20 0551 05/11/20 0531 05/12/20 0523  NA 139   < > 143 143 139 140 138  K 4.3   < > 3.6 4.0 3.6 3.4* 3.8  CL 99   < > 108 112* 112* 109 107  CO2 26   < > 25 25 22 23 22   GLUCOSE 180*   < > 110* 107* 95 95 90  BUN 21   < > 22 13 <5* <5* <5*  CREATININE 0.97   < > 0.70 0.58 0.55 0.56 0.51  CALCIUM 10.0   < > 8.0* 7.7* 8.1* 8.5* 8.1*  MG 1.9  --   --  1.9 1.8 1.7 1.9  PHOS 3.3  --   --   --   --   --   --    < > = values in this interval not displayed.   Liver Function Tests: Recent Labs  Lab 05/07/20 0647 05/12/20 0523  AST 31 19  ALT 21 17  ALKPHOS 85 53  BILITOT 0.9 0.7  PROT 9.0* 6.1*  ALBUMIN 5.0 3.4*   Recent Labs  Lab 05/07/20 0647  LIPASE 42   No results for input(s): AMMONIA in the last 168 hours. CBC: Recent Labs  Lab 05/08/20 0555 05/09/20 0504 05/10/20 0551 05/11/20 0531 05/12/20 0523  WBC 6.3 6.3 6.1 7.6 8.5   HGB 11.5* 10.3* 10.8* 11.1* 11.1*  HCT 36.2 32.4* 34.6* 34.9* 35.2*  MCV 98.4 99.4 98.6 97.5 99.2  PLT 252 200 219 240 223   Cardiac Enzymes: No results for input(s): CKTOTAL, CKMB, CKMBINDEX, TROPONINI in the last 168 hours. BNP: Invalid input(s): POCBNP CBG: No results for input(s): GLUCAP in the last 168 hours. D-Dimer No results for input(s): DDIMER in the last 72 hours. Hgb A1c No results for input(s): HGBA1C in the last 72 hours. Lipid Profile No results for input(s): CHOL, HDL, LDLCALC, TRIG, CHOLHDL, LDLDIRECT in the last 72 hours. Thyroid function studies No results for input(s): TSH, T4TOTAL, T3FREE, THYROIDAB in the last 72 hours.  Invalid input(s): FREET3 Anemia work up No results for input(s): VITAMINB12, FOLATE, FERRITIN, TIBC, IRON, RETICCTPCT in the last 72 hours. Urinalysis    Component Value Date/Time   COLORURINE YELLOW 05/07/2020 0647   APPEARANCEUR HAZY (A) 05/07/2020 0647   LABSPEC 1.018 05/07/2020 0647   PHURINE 5.0 05/07/2020 0647   GLUCOSEU NEGATIVE 05/07/2020 0647   HGBUR NEGATIVE 05/07/2020 0647   BILIRUBINUR NEGATIVE 05/07/2020 0647   BILIRUBINUR neg 06/06/2015 1201   KETONESUR 5 (A) 05/07/2020 0647   PROTEINUR 30 (A) 05/07/2020 0647   UROBILINOGEN 0.2 06/06/2015 1201   UROBILINOGEN 0.2 11/22/2014 0929   NITRITE NEGATIVE 05/07/2020 0647   LEUKOCYTESUR NEGATIVE 05/07/2020 0647   Sepsis Labs Invalid input(s): PROCALCITONIN,  WBC,  LACTICIDVEN Microbiology Recent Results (from the past 240 hour(s))  Respiratory Panel by RT PCR (Flu A&B, Covid) - Nasopharyngeal Swab     Status: None   Collection Time: 05/07/20  1:05 PM   Specimen: Nasopharyngeal Swab  Result Value Ref Range Status   SARS Coronavirus 2 by RT PCR NEGATIVE NEGATIVE Final    Comment: (NOTE) SARS-CoV-2 target nucleic acids are NOT DETECTED.  The SARS-CoV-2 RNA is generally detectable in upper respiratoy specimens during the acute phase of infection. The lowest concentration  of SARS-CoV-2 viral copies this assay can detect is 131 copies/mL. A negative result does not preclude SARS-Cov-2 infection and  should not be used as the sole basis for treatment or other patient management decisions. A negative result may occur with  improper specimen collection/handling, submission of specimen other than nasopharyngeal swab, presence of viral mutation(s) within the areas targeted by this assay, and inadequate number of viral copies (<131 copies/mL). A negative result must be combined with clinical observations, patient history, and epidemiological information. The expected result is Negative.  Fact Sheet for Patients:  PinkCheek.be  Fact Sheet for Healthcare Providers:  GravelBags.it  This test is no t yet approved or cleared by the [place] FDA and  has been authorized for detection and/or diagnosis of SARS-CoV-2 by FDA under an Emergency Use Authorization (EUA). This EUA will remain  in effect (meaning this test can be used) for the duration of the COVID-19 declaration under Section 564(b)(1) of the Act, 21 U.S.C. section 360bbb-3(b)(1), unless the authorization is terminated or revoked sooner.     Influenza A by PCR NEGATIVE NEGATIVE Final   Influenza B by PCR NEGATIVE NEGATIVE Final    Comment: (NOTE) The Xpert Xpress SARS-CoV-2/FLU/RSV assay is intended as an aid in  the diagnosis of influenza from Nasopharyngeal swab specimens and  should not be used as a sole basis for treatment. Nasal washings and  aspirates are unacceptable for Xpert Xpress SARS-CoV-2/FLU/RSV  testing.  Fact Sheet for Patients: PinkCheek.be  Fact Sheet for Healthcare Providers: GravelBags.it  This test is not yet approved or cleared by the [place] FDA and  has been authorized for detection and/or diagnosis of SARS-CoV-2 by  FDA under an Emergency Use  Authorization (EUA). This EUA will remain  in effect (meaning this test can be used) for the duration of the  Covid-19 declaration under Section 564(b)(1) of the Act, 21  U.S.C. section 360bbb-3(b)(1), unless the authorization is  terminated or revoked. Performed at [facility], [place] [address] [place], [place] [address]    Time Spent: 49min  SIGNED:   Marylu Lund, MD  [facility] 05/12/2020, 2:49 PM  If 7PM-7AM, please contact night-coverage

## 2020-05-12 NOTE — Discharge Instructions (Signed)
Per General Surgery, continue soft diet for 1-2 weeks

## 2020-05-12 NOTE — Progress Notes (Signed)
       Subjective: Feeling well. No complaints, no problem with full liquids. Ostomy working.   ROS: See above, otherwise other systems negative  Objective: Vital signs in last 24 hours: Temp:  [98.2 F (36.8 C)-100.3 F (37.9 C)] 98.4 F (36.9 C) (11/18 0745) Pulse Rate:  [67-88] 70 (11/18 0745) Resp:  [16-19] 16 (11/18 0745) BP: (147-177)/(80-90) 177/90 (11/18 0745) SpO2:  [93 %-100 %] 93 % (11/18 0745) Last BM Date: 05/12/20  Intake/Output from previous day: 11/17 0701 - 11/18 0700 In: 2048.5 [I.V.:1692.9; IV Piggyback:355.6] Out: 1435 [Urine:1100; Stool:335] Intake/Output this shift: Total I/O In: -  Out: 500 [Urine:500]  PE: Abd: soft, NT, ND, chronic ventral hernia stable, colostomy present with liquid output in the bag.  Stoma is pink and viable.  Lab Results:  Recent Labs    05/11/20 0531 05/12/20 0523  WBC 7.6 8.5  HGB 11.1* 11.1*  HCT 34.9* 35.2*  PLT 240 223   BMET Recent Labs    05/11/20 0531 05/12/20 0523  NA 140 138  K 3.4* 3.8  CL 109 107  CO2 23 22  GLUCOSE 95 90  BUN <5* <5*  CREATININE 0.56 0.51  CALCIUM 8.5* 8.1*   PT/INR No results for input(s): LABPROT, INR in the last 72 hours. CMP     Component Value Date/Time   NA 138 05/12/2020 0523   NA 143 09/10/2016 0000   K 3.8 05/12/2020 0523   CL 107 05/12/2020 0523   CO2 22 05/12/2020 0523   GLUCOSE 90 05/12/2020 0523   BUN <5 (L) 05/12/2020 0523   BUN 14 09/10/2016 0000   CREATININE 0.51 05/12/2020 0523   CREATININE 0.76 11/19/2017 1024   CALCIUM 8.1 (L) 05/12/2020 0523   PROT 6.1 (L) 05/12/2020 0523   ALBUMIN 3.4 (L) 05/12/2020 0523   AST 19 05/12/2020 0523   ALT 17 05/12/2020 0523   ALKPHOS 53 05/12/2020 0523   BILITOT 0.7 05/12/2020 0523   GFRNONAA >60 05/12/2020 0523   GFRNONAA 85 11/19/2017 1024   GFRAA 98 11/19/2017 1024   Lipase     Component Value Date/Time   LIPASE 42 05/07/2020 0647       Studies/Results: No results  found.  Anti-infectives: Anti-infectives (From admission, onward)   None       Assessment/Plan Large incisional hernia without incarceration  Recurrently SBO, likely secondary to adhesive disease -continues to resolve -adv to Soft diet -mobilize as she is able -OK for discharge later today. Would continue soft diet for 1-2 weeks.   FEN - FLD, IVFs VTE - Lovenox ID - none currently   LOS: 5 days    Clovis Riley , MD [facility] Surgery 05/12/2020, 9:45 AM Please see Amion for pager number during day hours 7:00am-4:30pm or 7:00am -11:30am on weekends

## 2020-05-12 NOTE — Plan of Care (Signed)

## 2020-05-12 NOTE — TOC Transition Note (Signed)
Transition of Care [facility]) - CM/SW Discharge Note   Patient Details  Name: [name] MRN: [id number] Date of Birth: 07/14/1955  [facility] of [facility] CM/SW Contact:  Dessa Phi, RN Phone Number: 05/12/2020, 3:03 PM   Clinical Narrative: d/c home w/HHPT-[facility] rep Tommi Rumps aware. No further CM needs.      Final next level of care: Home w Home Health Services Barriers to Discharge: No Barriers Identified   Patient Goals and CMS Choice   CMS Medicare.gov Compare Post Acute Care list provided to:: Patient Choice offered to / list presented to : Patient  Discharge Placement                       Discharge Plan and Services   Discharge Planning Services: CM Consult Post Acute Care Choice: Home Health                    HH Arranged: PT [place]: [place] Date [place]: 05/10/20 Time [place]: 1303 Representative spoke with at [place]: [place] ([place]) Interventions     Readmission Risk Interventions No flowsheet data found.

## 2020-05-12 NOTE — Progress Notes (Signed)
Patient stable.  RN removed IV.  RN reviewed discharge paperwork with patient.  NT assisted RN in dressing and gathering personal belongings.  NT transported patient to exit to go home with daughter.

## 2020-05-13 ENCOUNTER — Telehealth: Payer: Self-pay

## 2020-05-13 NOTE — Telephone Encounter (Signed)
Transition Care Management Unsuccessful Follow-up Telephone Call  Date of discharge and from where:  05/12/2020, [facility]   Attempts:  1st Attempt  Reason for unsuccessful TCM follow-up call:  Left voice message

## 2020-05-13 NOTE — Telephone Encounter (Signed)
Transition Care Management Follow-up Telephone Call  Date of discharge and from where: 05/12/2020, [facility]   How have you been since you were released from the hospital? Patient states that she is doing fine. No complaints at this time.   Any questions or concerns? No  Items Reviewed:  Did the pt receive and understand the discharge instructions provided? Yes   Medications obtained and verified? Yes   Other? No   Any new allergies since your discharge? No   Dietary orders reviewed? Yes  Do you have support at home? Yes   Home Care and Equipment/Supplies: Were home health services ordered? yes If so, what is the name of the agency? [facility]  Has the agency set up a time to come to the patient's home? no Were any new equipment or medical supplies ordered?  No What is the name of the medical supply agency? N/A Were you able to get the supplies/equipment? not applicable Do you have any questions related to the use of the equipment or supplies? No  Functional Questionnaire: (I = Independent and D = Dependent) ADLs: I  Bathing/Dressing- I  Meal Prep- I  Eating- I  Maintaining continence- I  Transferring/Ambulation- I  Managing Meds- I  Follow up appointments reviewed:   PCP Hospital f/u appt confirmed? Yes  Scheduled to see Dr. Sarajane Jews on 05/23/2020 @ 10:45 am. Patient states that she needs this appointment changed to 05/30/20 at 11 am if at all possible. I did not see this availability on provider schedule. I told her I would forward to provider and if this can be done someone from the office will contact her.   [facility] f/u appt confirmed? follow up with cardiology   Are transportation arrangements needed? No   If their condition worsens, is the pt aware to call PCP or go to the Emergency Dept.? Yes  Was the patient provided with contact information for the PCP's office or ED? Yes  Was to pt encouraged to call back with questions or concerns?  Yes

## 2020-05-16 ENCOUNTER — Other Ambulatory Visit: Payer: Self-pay | Admitting: *Deleted

## 2020-05-16 ENCOUNTER — Encounter: Payer: Self-pay | Admitting: *Deleted

## 2020-05-16 ENCOUNTER — Ambulatory Visit: Payer: Medicare Other | Admitting: *Deleted

## 2020-05-16 DIAGNOSIS — I700 Atherosclerosis of aorta: Secondary | ICD-10-CM | POA: Diagnosis not present

## 2020-05-16 DIAGNOSIS — M419 Scoliosis, unspecified: Secondary | ICD-10-CM | POA: Diagnosis not present

## 2020-05-16 DIAGNOSIS — M069 Rheumatoid arthritis, unspecified: Secondary | ICD-10-CM | POA: Diagnosis not present

## 2020-05-16 DIAGNOSIS — K219 Gastro-esophageal reflux disease without esophagitis: Secondary | ICD-10-CM | POA: Diagnosis not present

## 2020-05-16 DIAGNOSIS — I4892 Unspecified atrial flutter: Secondary | ICD-10-CM | POA: Diagnosis not present

## 2020-05-16 DIAGNOSIS — M160 Bilateral primary osteoarthritis of hip: Secondary | ICD-10-CM | POA: Diagnosis not present

## 2020-05-16 DIAGNOSIS — K565 Intestinal adhesions [bands], unspecified as to partial versus complete obstruction: Secondary | ICD-10-CM | POA: Diagnosis not present

## 2020-05-16 DIAGNOSIS — J45909 Unspecified asthma, uncomplicated: Secondary | ICD-10-CM | POA: Diagnosis not present

## 2020-05-16 DIAGNOSIS — K439 Ventral hernia without obstruction or gangrene: Secondary | ICD-10-CM | POA: Diagnosis not present

## 2020-05-16 DIAGNOSIS — I4891 Unspecified atrial fibrillation: Secondary | ICD-10-CM | POA: Diagnosis not present

## 2020-05-16 DIAGNOSIS — K5730 Diverticulosis of large intestine without perforation or abscess without bleeding: Secondary | ICD-10-CM | POA: Diagnosis not present

## 2020-05-16 DIAGNOSIS — Z8673 Personal history of transient ischemic attack (TIA), and cerebral infarction without residual deficits: Secondary | ICD-10-CM | POA: Diagnosis not present

## 2020-05-16 DIAGNOSIS — M810 Age-related osteoporosis without current pathological fracture: Secondary | ICD-10-CM | POA: Diagnosis not present

## 2020-05-16 DIAGNOSIS — I5042 Chronic combined systolic (congestive) and diastolic (congestive) heart failure: Secondary | ICD-10-CM | POA: Diagnosis not present

## 2020-05-16 DIAGNOSIS — E440 Moderate protein-calorie malnutrition: Secondary | ICD-10-CM | POA: Diagnosis not present

## 2020-05-16 DIAGNOSIS — H548 Legal blindness, as defined in USA: Secondary | ICD-10-CM | POA: Diagnosis not present

## 2020-05-16 DIAGNOSIS — Z86010 Personal history of colonic polyps: Secondary | ICD-10-CM | POA: Diagnosis not present

## 2020-05-16 DIAGNOSIS — K5909 Other constipation: Secondary | ICD-10-CM | POA: Diagnosis not present

## 2020-05-16 DIAGNOSIS — Z933 Colostomy status: Secondary | ICD-10-CM | POA: Diagnosis not present

## 2020-05-16 DIAGNOSIS — I110 Hypertensive heart disease with heart failure: Secondary | ICD-10-CM | POA: Diagnosis not present

## 2020-05-16 DIAGNOSIS — Z85528 Personal history of other malignant neoplasm of kidney: Secondary | ICD-10-CM | POA: Diagnosis not present

## 2020-05-16 DIAGNOSIS — M47816 Spondylosis without myelopathy or radiculopathy, lumbar region: Secondary | ICD-10-CM | POA: Diagnosis not present

## 2020-05-16 DIAGNOSIS — Z7982 Long term (current) use of aspirin: Secondary | ICD-10-CM | POA: Diagnosis not present

## 2020-05-16 DIAGNOSIS — M1A9XX0 Chronic gout, unspecified, without tophus (tophi): Secondary | ICD-10-CM | POA: Diagnosis not present

## 2020-05-16 NOTE — Patient Outreach (Signed)
[facility]) Care Management  05/16/2020  AGATHA DUPLECHAIN 1956-06-15 [id number]   EMMI- GENERAL DISCHARGE-UNSUCCESSFUL RED ON EMMI ALERT Day #1 Date: 05/15/2020 Red Alert Reason: NO D/C PAPERWORK, NO TRANSPORTATION  OUTREACH #1 RN initial outreach attempt unsuccessful. RN able to leave a HIPAA voice message requesting a call back.  Will completed another outreach call over the next week.  Raina Mina, RN Care Management Coordinator [place] Office [phone]

## 2020-05-17 ENCOUNTER — Telehealth: Payer: Self-pay | Admitting: Family Medicine

## 2020-05-17 DIAGNOSIS — Z79899 Other long term (current) drug therapy: Secondary | ICD-10-CM | POA: Diagnosis not present

## 2020-05-17 DIAGNOSIS — M0579 Rheumatoid arthritis with rheumatoid factor of multiple sites without organ or systems involvement: Secondary | ICD-10-CM | POA: Diagnosis not present

## 2020-05-17 MED ORDER — MEGESTROL ACETATE 625 MG/5ML PO SUSP
ORAL | 5 refills | Status: DC
Start: 2020-05-17 — End: 2021-10-03

## 2020-05-17 NOTE — Telephone Encounter (Signed)
Done

## 2020-05-17 NOTE — Telephone Encounter (Signed)
Patient is calling and requesting a refill for megestrol (MEGACE ES) 625 MG/5ML suspension sent to [facility] [place], [place] - [address], [place] [address]  Phone:  [phone] Fax:  [phone]  CB is [phone]

## 2020-05-17 NOTE — Telephone Encounter (Signed)
Okay to refill? 

## 2020-05-18 ENCOUNTER — Telehealth: Payer: Self-pay

## 2020-05-18 DIAGNOSIS — M419 Scoliosis, unspecified: Secondary | ICD-10-CM | POA: Diagnosis not present

## 2020-05-18 DIAGNOSIS — Z85528 Personal history of other malignant neoplasm of kidney: Secondary | ICD-10-CM | POA: Diagnosis not present

## 2020-05-18 DIAGNOSIS — Z8673 Personal history of transient ischemic attack (TIA), and cerebral infarction without residual deficits: Secondary | ICD-10-CM | POA: Diagnosis not present

## 2020-05-18 DIAGNOSIS — M160 Bilateral primary osteoarthritis of hip: Secondary | ICD-10-CM | POA: Diagnosis not present

## 2020-05-18 DIAGNOSIS — K5730 Diverticulosis of large intestine without perforation or abscess without bleeding: Secondary | ICD-10-CM | POA: Diagnosis not present

## 2020-05-18 DIAGNOSIS — Z7982 Long term (current) use of aspirin: Secondary | ICD-10-CM | POA: Diagnosis not present

## 2020-05-18 DIAGNOSIS — I4891 Unspecified atrial fibrillation: Secondary | ICD-10-CM | POA: Diagnosis not present

## 2020-05-18 DIAGNOSIS — I110 Hypertensive heart disease with heart failure: Secondary | ICD-10-CM | POA: Diagnosis not present

## 2020-05-18 DIAGNOSIS — M810 Age-related osteoporosis without current pathological fracture: Secondary | ICD-10-CM | POA: Diagnosis not present

## 2020-05-18 DIAGNOSIS — Z933 Colostomy status: Secondary | ICD-10-CM | POA: Diagnosis not present

## 2020-05-18 DIAGNOSIS — I4892 Unspecified atrial flutter: Secondary | ICD-10-CM | POA: Diagnosis not present

## 2020-05-18 DIAGNOSIS — M47816 Spondylosis without myelopathy or radiculopathy, lumbar region: Secondary | ICD-10-CM | POA: Diagnosis not present

## 2020-05-18 DIAGNOSIS — J45909 Unspecified asthma, uncomplicated: Secondary | ICD-10-CM | POA: Diagnosis not present

## 2020-05-18 DIAGNOSIS — H548 Legal blindness, as defined in USA: Secondary | ICD-10-CM | POA: Diagnosis not present

## 2020-05-18 DIAGNOSIS — Z86010 Personal history of colonic polyps: Secondary | ICD-10-CM | POA: Diagnosis not present

## 2020-05-18 DIAGNOSIS — M0549 Rheumatoid myopathy with rheumatoid arthritis of multiple sites: Secondary | ICD-10-CM | POA: Diagnosis not present

## 2020-05-18 DIAGNOSIS — K565 Intestinal adhesions [bands], unspecified as to partial versus complete obstruction: Secondary | ICD-10-CM | POA: Diagnosis not present

## 2020-05-18 DIAGNOSIS — K439 Ventral hernia without obstruction or gangrene: Secondary | ICD-10-CM | POA: Diagnosis not present

## 2020-05-18 DIAGNOSIS — M069 Rheumatoid arthritis, unspecified: Secondary | ICD-10-CM | POA: Diagnosis not present

## 2020-05-18 DIAGNOSIS — I700 Atherosclerosis of aorta: Secondary | ICD-10-CM | POA: Diagnosis not present

## 2020-05-18 DIAGNOSIS — K219 Gastro-esophageal reflux disease without esophagitis: Secondary | ICD-10-CM | POA: Diagnosis not present

## 2020-05-18 DIAGNOSIS — M1A9XX0 Chronic gout, unspecified, without tophus (tophi): Secondary | ICD-10-CM | POA: Diagnosis not present

## 2020-05-18 DIAGNOSIS — K5909 Other constipation: Secondary | ICD-10-CM | POA: Diagnosis not present

## 2020-05-18 DIAGNOSIS — I5042 Chronic combined systolic (congestive) and diastolic (congestive) heart failure: Secondary | ICD-10-CM | POA: Diagnosis not present

## 2020-05-18 DIAGNOSIS — E440 Moderate protein-calorie malnutrition: Secondary | ICD-10-CM | POA: Diagnosis not present

## 2020-05-18 NOTE — Telephone Encounter (Signed)
Form filled out and faxed to [facility] Alternative program @ [phone].  Dm/cma

## 2020-05-23 ENCOUNTER — Other Ambulatory Visit: Payer: Self-pay | Admitting: *Deleted

## 2020-05-23 ENCOUNTER — Encounter: Payer: Medicare Other | Admitting: Family Medicine

## 2020-05-23 ENCOUNTER — Ambulatory Visit: Payer: Self-pay | Admitting: *Deleted

## 2020-05-23 ENCOUNTER — Ambulatory Visit: Payer: Medicare Other | Admitting: *Deleted

## 2020-05-23 DIAGNOSIS — Z933 Colostomy status: Secondary | ICD-10-CM | POA: Diagnosis not present

## 2020-05-23 DIAGNOSIS — M160 Bilateral primary osteoarthritis of hip: Secondary | ICD-10-CM | POA: Diagnosis not present

## 2020-05-23 DIAGNOSIS — K219 Gastro-esophageal reflux disease without esophagitis: Secondary | ICD-10-CM | POA: Diagnosis not present

## 2020-05-23 DIAGNOSIS — Z7982 Long term (current) use of aspirin: Secondary | ICD-10-CM | POA: Diagnosis not present

## 2020-05-23 DIAGNOSIS — M47816 Spondylosis without myelopathy or radiculopathy, lumbar region: Secondary | ICD-10-CM | POA: Diagnosis not present

## 2020-05-23 DIAGNOSIS — Z8673 Personal history of transient ischemic attack (TIA), and cerebral infarction without residual deficits: Secondary | ICD-10-CM | POA: Diagnosis not present

## 2020-05-23 DIAGNOSIS — M1A9XX0 Chronic gout, unspecified, without tophus (tophi): Secondary | ICD-10-CM | POA: Diagnosis not present

## 2020-05-23 DIAGNOSIS — M810 Age-related osteoporosis without current pathological fracture: Secondary | ICD-10-CM | POA: Diagnosis not present

## 2020-05-23 DIAGNOSIS — M069 Rheumatoid arthritis, unspecified: Secondary | ICD-10-CM | POA: Diagnosis not present

## 2020-05-23 DIAGNOSIS — I4892 Unspecified atrial flutter: Secondary | ICD-10-CM | POA: Diagnosis not present

## 2020-05-23 DIAGNOSIS — K565 Intestinal adhesions [bands], unspecified as to partial versus complete obstruction: Secondary | ICD-10-CM | POA: Diagnosis not present

## 2020-05-23 DIAGNOSIS — E440 Moderate protein-calorie malnutrition: Secondary | ICD-10-CM | POA: Diagnosis not present

## 2020-05-23 DIAGNOSIS — K5909 Other constipation: Secondary | ICD-10-CM | POA: Diagnosis not present

## 2020-05-23 DIAGNOSIS — I700 Atherosclerosis of aorta: Secondary | ICD-10-CM | POA: Diagnosis not present

## 2020-05-23 DIAGNOSIS — H548 Legal blindness, as defined in USA: Secondary | ICD-10-CM | POA: Diagnosis not present

## 2020-05-23 DIAGNOSIS — I4891 Unspecified atrial fibrillation: Secondary | ICD-10-CM | POA: Diagnosis not present

## 2020-05-23 DIAGNOSIS — K439 Ventral hernia without obstruction or gangrene: Secondary | ICD-10-CM | POA: Diagnosis not present

## 2020-05-23 DIAGNOSIS — K5730 Diverticulosis of large intestine without perforation or abscess without bleeding: Secondary | ICD-10-CM | POA: Diagnosis not present

## 2020-05-23 DIAGNOSIS — J45909 Unspecified asthma, uncomplicated: Secondary | ICD-10-CM | POA: Diagnosis not present

## 2020-05-23 DIAGNOSIS — M419 Scoliosis, unspecified: Secondary | ICD-10-CM | POA: Diagnosis not present

## 2020-05-23 DIAGNOSIS — I5042 Chronic combined systolic (congestive) and diastolic (congestive) heart failure: Secondary | ICD-10-CM | POA: Diagnosis not present

## 2020-05-23 DIAGNOSIS — I110 Hypertensive heart disease with heart failure: Secondary | ICD-10-CM | POA: Diagnosis not present

## 2020-05-23 DIAGNOSIS — Z85528 Personal history of other malignant neoplasm of kidney: Secondary | ICD-10-CM | POA: Diagnosis not present

## 2020-05-23 DIAGNOSIS — Z86010 Personal history of colonic polyps: Secondary | ICD-10-CM | POA: Diagnosis not present

## 2020-05-23 NOTE — Patient Outreach (Signed)
[facility]) Care Management  05/23/2020  [name] May 18, 1956 [id number]    EMMI: GENERAL DISCHARGE-SUCCESSFUL DATE: 11/21 & 11/24 DAY # 1 & 4 ISSUES: No d/c paperwork, No transportation, lost interest.  RN received a call back from the pt who requested a call back at 2 PM today. Will attempt to follow up once again later today for pending services related to the EMMI.  Raina Mina, RN Care Management Coordinator [place] Office [phone]

## 2020-05-23 NOTE — Patient Outreach (Signed)
[place] [facility]) Care Management  05/23/2020  JESSICAMARIE AMIRI 05/02/1956 [id number]   EMMI- General Discharge RED ON EMMI ALERT Day #1 & #4 Date:11/21 & 11/24 Red Alert Reason: No d/c paperwork, no transportation & lost of interest  OUTREACH #4 RN followed with the pt as requested and inquired on the above EMMI. Pt states she has reviewed her discharge paperwork and Epic notes indicates pt has had communication with her provider's office along with the transition of care call. Pt also confirmed she has her medications and transportation if needed to her appointments. RN offered another sources of transportation for [employer] application however pt declined.  RN inquired on depression as pt denies any suicidal ideations from the last EMMI call for lost of interest. Pt again confirmed supportive family with no additional needs.  Based upon the responses RN will close this case.  Raina Mina, RN Care Management Coordinator [place] Office [phone]

## 2020-05-23 NOTE — Patient Outreach (Signed)
[place] [facility]) Care Management  05/23/2020  BECKY COLAN [birth year] [id number]   EMMI-GENERAL DISCHARGE-UNSUCCESSFUL RED ON EMMI ALERT Day #1 & #4 Date:11/21 & 11/24 Red Alert Reason: NO D/C PAPERWORK, NO TRANSPORTATION AND  (11/24) LOST OF INTEREST  OUTREACH # 2 (11/21) & #1 (11/24)  RN  Attempted outreach call however remains unsuccessful. RN able to leave a HIPAA approved voice message requesting a call back.   Will rescheduled another outreach call over the next week for pending services.  Raina Mina, RN Care Management Coordinator [place] Office [phone]

## 2020-05-24 DIAGNOSIS — Z933 Colostomy status: Secondary | ICD-10-CM | POA: Diagnosis not present

## 2020-05-26 DIAGNOSIS — M810 Age-related osteoporosis without current pathological fracture: Secondary | ICD-10-CM | POA: Diagnosis not present

## 2020-05-26 DIAGNOSIS — K5730 Diverticulosis of large intestine without perforation or abscess without bleeding: Secondary | ICD-10-CM | POA: Diagnosis not present

## 2020-05-26 DIAGNOSIS — M419 Scoliosis, unspecified: Secondary | ICD-10-CM | POA: Diagnosis not present

## 2020-05-26 DIAGNOSIS — E440 Moderate protein-calorie malnutrition: Secondary | ICD-10-CM | POA: Diagnosis not present

## 2020-05-26 DIAGNOSIS — M069 Rheumatoid arthritis, unspecified: Secondary | ICD-10-CM | POA: Diagnosis not present

## 2020-05-26 DIAGNOSIS — Z86010 Personal history of colonic polyps: Secondary | ICD-10-CM | POA: Diagnosis not present

## 2020-05-26 DIAGNOSIS — M160 Bilateral primary osteoarthritis of hip: Secondary | ICD-10-CM | POA: Diagnosis not present

## 2020-05-26 DIAGNOSIS — K219 Gastro-esophageal reflux disease without esophagitis: Secondary | ICD-10-CM | POA: Diagnosis not present

## 2020-05-26 DIAGNOSIS — M47816 Spondylosis without myelopathy or radiculopathy, lumbar region: Secondary | ICD-10-CM | POA: Diagnosis not present

## 2020-05-26 DIAGNOSIS — K565 Intestinal adhesions [bands], unspecified as to partial versus complete obstruction: Secondary | ICD-10-CM | POA: Diagnosis not present

## 2020-05-26 DIAGNOSIS — I5042 Chronic combined systolic (congestive) and diastolic (congestive) heart failure: Secondary | ICD-10-CM | POA: Diagnosis not present

## 2020-05-26 DIAGNOSIS — H548 Legal blindness, as defined in USA: Secondary | ICD-10-CM | POA: Diagnosis not present

## 2020-05-26 DIAGNOSIS — Z85528 Personal history of other malignant neoplasm of kidney: Secondary | ICD-10-CM | POA: Diagnosis not present

## 2020-05-26 DIAGNOSIS — I4891 Unspecified atrial fibrillation: Secondary | ICD-10-CM | POA: Diagnosis not present

## 2020-05-26 DIAGNOSIS — Z7982 Long term (current) use of aspirin: Secondary | ICD-10-CM | POA: Diagnosis not present

## 2020-05-26 DIAGNOSIS — K5909 Other constipation: Secondary | ICD-10-CM | POA: Diagnosis not present

## 2020-05-26 DIAGNOSIS — I110 Hypertensive heart disease with heart failure: Secondary | ICD-10-CM | POA: Diagnosis not present

## 2020-05-26 DIAGNOSIS — Z933 Colostomy status: Secondary | ICD-10-CM | POA: Diagnosis not present

## 2020-05-26 DIAGNOSIS — I700 Atherosclerosis of aorta: Secondary | ICD-10-CM | POA: Diagnosis not present

## 2020-05-26 DIAGNOSIS — K439 Ventral hernia without obstruction or gangrene: Secondary | ICD-10-CM | POA: Diagnosis not present

## 2020-05-26 DIAGNOSIS — J45909 Unspecified asthma, uncomplicated: Secondary | ICD-10-CM | POA: Diagnosis not present

## 2020-05-26 DIAGNOSIS — M1A9XX0 Chronic gout, unspecified, without tophus (tophi): Secondary | ICD-10-CM | POA: Diagnosis not present

## 2020-05-26 DIAGNOSIS — I4892 Unspecified atrial flutter: Secondary | ICD-10-CM | POA: Diagnosis not present

## 2020-05-26 DIAGNOSIS — Z8673 Personal history of transient ischemic attack (TIA), and cerebral infarction without residual deficits: Secondary | ICD-10-CM | POA: Diagnosis not present

## 2020-05-27 ENCOUNTER — Ambulatory Visit: Payer: Self-pay | Admitting: *Deleted

## 2020-05-27 DIAGNOSIS — I700 Atherosclerosis of aorta: Secondary | ICD-10-CM | POA: Diagnosis not present

## 2020-05-27 DIAGNOSIS — M47816 Spondylosis without myelopathy or radiculopathy, lumbar region: Secondary | ICD-10-CM | POA: Diagnosis not present

## 2020-05-27 DIAGNOSIS — I5042 Chronic combined systolic (congestive) and diastolic (congestive) heart failure: Secondary | ICD-10-CM | POA: Diagnosis not present

## 2020-05-27 DIAGNOSIS — Z933 Colostomy status: Secondary | ICD-10-CM | POA: Diagnosis not present

## 2020-05-27 DIAGNOSIS — I4892 Unspecified atrial flutter: Secondary | ICD-10-CM | POA: Diagnosis not present

## 2020-05-27 DIAGNOSIS — Z85528 Personal history of other malignant neoplasm of kidney: Secondary | ICD-10-CM | POA: Diagnosis not present

## 2020-05-27 DIAGNOSIS — Z86010 Personal history of colonic polyps: Secondary | ICD-10-CM | POA: Diagnosis not present

## 2020-05-27 DIAGNOSIS — I110 Hypertensive heart disease with heart failure: Secondary | ICD-10-CM | POA: Diagnosis not present

## 2020-05-27 DIAGNOSIS — E440 Moderate protein-calorie malnutrition: Secondary | ICD-10-CM | POA: Diagnosis not present

## 2020-05-27 DIAGNOSIS — Z7982 Long term (current) use of aspirin: Secondary | ICD-10-CM | POA: Diagnosis not present

## 2020-05-27 DIAGNOSIS — K439 Ventral hernia without obstruction or gangrene: Secondary | ICD-10-CM | POA: Diagnosis not present

## 2020-05-27 DIAGNOSIS — M810 Age-related osteoporosis without current pathological fracture: Secondary | ICD-10-CM | POA: Diagnosis not present

## 2020-05-27 DIAGNOSIS — M1A9XX0 Chronic gout, unspecified, without tophus (tophi): Secondary | ICD-10-CM | POA: Diagnosis not present

## 2020-05-27 DIAGNOSIS — M069 Rheumatoid arthritis, unspecified: Secondary | ICD-10-CM | POA: Diagnosis not present

## 2020-05-27 DIAGNOSIS — K5730 Diverticulosis of large intestine without perforation or abscess without bleeding: Secondary | ICD-10-CM | POA: Diagnosis not present

## 2020-05-27 DIAGNOSIS — M160 Bilateral primary osteoarthritis of hip: Secondary | ICD-10-CM | POA: Diagnosis not present

## 2020-05-27 DIAGNOSIS — J45909 Unspecified asthma, uncomplicated: Secondary | ICD-10-CM | POA: Diagnosis not present

## 2020-05-27 DIAGNOSIS — I4891 Unspecified atrial fibrillation: Secondary | ICD-10-CM | POA: Diagnosis not present

## 2020-05-27 DIAGNOSIS — K565 Intestinal adhesions [bands], unspecified as to partial versus complete obstruction: Secondary | ICD-10-CM | POA: Diagnosis not present

## 2020-05-27 DIAGNOSIS — Z8673 Personal history of transient ischemic attack (TIA), and cerebral infarction without residual deficits: Secondary | ICD-10-CM | POA: Diagnosis not present

## 2020-05-27 DIAGNOSIS — K5909 Other constipation: Secondary | ICD-10-CM | POA: Diagnosis not present

## 2020-05-27 DIAGNOSIS — M419 Scoliosis, unspecified: Secondary | ICD-10-CM | POA: Diagnosis not present

## 2020-05-27 DIAGNOSIS — K219 Gastro-esophageal reflux disease without esophagitis: Secondary | ICD-10-CM | POA: Diagnosis not present

## 2020-05-27 DIAGNOSIS — H548 Legal blindness, as defined in USA: Secondary | ICD-10-CM | POA: Diagnosis not present

## 2020-05-28 DIAGNOSIS — M1A9XX0 Chronic gout, unspecified, without tophus (tophi): Secondary | ICD-10-CM | POA: Diagnosis not present

## 2020-05-28 DIAGNOSIS — M47816 Spondylosis without myelopathy or radiculopathy, lumbar region: Secondary | ICD-10-CM | POA: Diagnosis not present

## 2020-05-28 DIAGNOSIS — M069 Rheumatoid arthritis, unspecified: Secondary | ICD-10-CM | POA: Diagnosis not present

## 2020-05-28 DIAGNOSIS — Z7982 Long term (current) use of aspirin: Secondary | ICD-10-CM | POA: Diagnosis not present

## 2020-05-28 DIAGNOSIS — E440 Moderate protein-calorie malnutrition: Secondary | ICD-10-CM | POA: Diagnosis not present

## 2020-05-28 DIAGNOSIS — K219 Gastro-esophageal reflux disease without esophagitis: Secondary | ICD-10-CM | POA: Diagnosis not present

## 2020-05-28 DIAGNOSIS — K5909 Other constipation: Secondary | ICD-10-CM | POA: Diagnosis not present

## 2020-05-28 DIAGNOSIS — Z85528 Personal history of other malignant neoplasm of kidney: Secondary | ICD-10-CM | POA: Diagnosis not present

## 2020-05-28 DIAGNOSIS — I5042 Chronic combined systolic (congestive) and diastolic (congestive) heart failure: Secondary | ICD-10-CM | POA: Diagnosis not present

## 2020-05-28 DIAGNOSIS — M810 Age-related osteoporosis without current pathological fracture: Secondary | ICD-10-CM | POA: Diagnosis not present

## 2020-05-28 DIAGNOSIS — I110 Hypertensive heart disease with heart failure: Secondary | ICD-10-CM | POA: Diagnosis not present

## 2020-05-28 DIAGNOSIS — K439 Ventral hernia without obstruction or gangrene: Secondary | ICD-10-CM | POA: Diagnosis not present

## 2020-05-28 DIAGNOSIS — I4892 Unspecified atrial flutter: Secondary | ICD-10-CM | POA: Diagnosis not present

## 2020-05-28 DIAGNOSIS — M160 Bilateral primary osteoarthritis of hip: Secondary | ICD-10-CM | POA: Diagnosis not present

## 2020-05-28 DIAGNOSIS — J45909 Unspecified asthma, uncomplicated: Secondary | ICD-10-CM | POA: Diagnosis not present

## 2020-05-28 DIAGNOSIS — K565 Intestinal adhesions [bands], unspecified as to partial versus complete obstruction: Secondary | ICD-10-CM | POA: Diagnosis not present

## 2020-05-28 DIAGNOSIS — K5730 Diverticulosis of large intestine without perforation or abscess without bleeding: Secondary | ICD-10-CM | POA: Diagnosis not present

## 2020-05-28 DIAGNOSIS — M419 Scoliosis, unspecified: Secondary | ICD-10-CM | POA: Diagnosis not present

## 2020-05-28 DIAGNOSIS — Z8673 Personal history of transient ischemic attack (TIA), and cerebral infarction without residual deficits: Secondary | ICD-10-CM | POA: Diagnosis not present

## 2020-05-28 DIAGNOSIS — I4891 Unspecified atrial fibrillation: Secondary | ICD-10-CM | POA: Diagnosis not present

## 2020-05-28 DIAGNOSIS — Z86010 Personal history of colonic polyps: Secondary | ICD-10-CM | POA: Diagnosis not present

## 2020-05-28 DIAGNOSIS — H548 Legal blindness, as defined in USA: Secondary | ICD-10-CM | POA: Diagnosis not present

## 2020-05-28 DIAGNOSIS — Z933 Colostomy status: Secondary | ICD-10-CM | POA: Diagnosis not present

## 2020-05-28 DIAGNOSIS — I700 Atherosclerosis of aorta: Secondary | ICD-10-CM | POA: Diagnosis not present

## 2020-05-30 DIAGNOSIS — E440 Moderate protein-calorie malnutrition: Secondary | ICD-10-CM

## 2020-05-30 DIAGNOSIS — Z86010 Personal history of colonic polyps: Secondary | ICD-10-CM

## 2020-05-30 DIAGNOSIS — I10 Essential (primary) hypertension: Secondary | ICD-10-CM

## 2020-05-30 DIAGNOSIS — Z933 Colostomy status: Secondary | ICD-10-CM

## 2020-05-30 DIAGNOSIS — M1A9XX0 Chronic gout, unspecified, without tophus (tophi): Secondary | ICD-10-CM

## 2020-05-30 DIAGNOSIS — I700 Atherosclerosis of aorta: Secondary | ICD-10-CM

## 2020-05-30 DIAGNOSIS — I4892 Unspecified atrial flutter: Secondary | ICD-10-CM

## 2020-05-30 DIAGNOSIS — Z9181 History of falling: Secondary | ICD-10-CM

## 2020-05-30 DIAGNOSIS — H548 Legal blindness, as defined in USA: Secondary | ICD-10-CM

## 2020-05-30 DIAGNOSIS — M419 Scoliosis, unspecified: Secondary | ICD-10-CM

## 2020-05-30 DIAGNOSIS — Z8673 Personal history of transient ischemic attack (TIA), and cerebral infarction without residual deficits: Secondary | ICD-10-CM

## 2020-05-30 DIAGNOSIS — I4891 Unspecified atrial fibrillation: Secondary | ICD-10-CM | POA: Diagnosis not present

## 2020-05-30 DIAGNOSIS — Z87891 Personal history of nicotine dependence: Secondary | ICD-10-CM

## 2020-05-30 DIAGNOSIS — K439 Ventral hernia without obstruction or gangrene: Secondary | ICD-10-CM

## 2020-05-30 DIAGNOSIS — K5730 Diverticulosis of large intestine without perforation or abscess without bleeding: Secondary | ICD-10-CM

## 2020-05-30 DIAGNOSIS — J45909 Unspecified asthma, uncomplicated: Secondary | ICD-10-CM

## 2020-05-30 DIAGNOSIS — K565 Intestinal adhesions [bands], unspecified as to partial versus complete obstruction: Secondary | ICD-10-CM | POA: Diagnosis not present

## 2020-05-30 DIAGNOSIS — Q8740 Marfan's syndrome, unspecified: Secondary | ICD-10-CM

## 2020-05-30 DIAGNOSIS — K5909 Other constipation: Secondary | ICD-10-CM

## 2020-05-30 DIAGNOSIS — M069 Rheumatoid arthritis, unspecified: Secondary | ICD-10-CM

## 2020-05-30 DIAGNOSIS — M47816 Spondylosis without myelopathy or radiculopathy, lumbar region: Secondary | ICD-10-CM

## 2020-05-30 DIAGNOSIS — Z96653 Presence of artificial knee joint, bilateral: Secondary | ICD-10-CM

## 2020-05-30 DIAGNOSIS — I110 Hypertensive heart disease with heart failure: Secondary | ICD-10-CM | POA: Diagnosis not present

## 2020-05-30 DIAGNOSIS — M160 Bilateral primary osteoarthritis of hip: Secondary | ICD-10-CM

## 2020-05-30 DIAGNOSIS — Z85528 Personal history of other malignant neoplasm of kidney: Secondary | ICD-10-CM

## 2020-05-30 DIAGNOSIS — I5042 Chronic combined systolic (congestive) and diastolic (congestive) heart failure: Secondary | ICD-10-CM | POA: Diagnosis not present

## 2020-05-30 DIAGNOSIS — Z79899 Other long term (current) drug therapy: Secondary | ICD-10-CM

## 2020-05-30 DIAGNOSIS — Z7982 Long term (current) use of aspirin: Secondary | ICD-10-CM

## 2020-05-30 DIAGNOSIS — M810 Age-related osteoporosis without current pathological fracture: Secondary | ICD-10-CM

## 2020-05-30 DIAGNOSIS — K219 Gastro-esophageal reflux disease without esophagitis: Secondary | ICD-10-CM

## 2020-05-31 DIAGNOSIS — Z933 Colostomy status: Secondary | ICD-10-CM | POA: Diagnosis not present

## 2020-05-31 DIAGNOSIS — K219 Gastro-esophageal reflux disease without esophagitis: Secondary | ICD-10-CM | POA: Diagnosis not present

## 2020-05-31 DIAGNOSIS — I4891 Unspecified atrial fibrillation: Secondary | ICD-10-CM | POA: Diagnosis not present

## 2020-05-31 DIAGNOSIS — Z7982 Long term (current) use of aspirin: Secondary | ICD-10-CM | POA: Diagnosis not present

## 2020-05-31 DIAGNOSIS — M810 Age-related osteoporosis without current pathological fracture: Secondary | ICD-10-CM | POA: Diagnosis not present

## 2020-05-31 DIAGNOSIS — I5042 Chronic combined systolic (congestive) and diastolic (congestive) heart failure: Secondary | ICD-10-CM | POA: Diagnosis not present

## 2020-05-31 DIAGNOSIS — Z86010 Personal history of colonic polyps: Secondary | ICD-10-CM | POA: Diagnosis not present

## 2020-05-31 DIAGNOSIS — H548 Legal blindness, as defined in USA: Secondary | ICD-10-CM | POA: Diagnosis not present

## 2020-05-31 DIAGNOSIS — K439 Ventral hernia without obstruction or gangrene: Secondary | ICD-10-CM | POA: Diagnosis not present

## 2020-05-31 DIAGNOSIS — K5909 Other constipation: Secondary | ICD-10-CM | POA: Diagnosis not present

## 2020-05-31 DIAGNOSIS — E440 Moderate protein-calorie malnutrition: Secondary | ICD-10-CM | POA: Diagnosis not present

## 2020-05-31 DIAGNOSIS — M160 Bilateral primary osteoarthritis of hip: Secondary | ICD-10-CM | POA: Diagnosis not present

## 2020-05-31 DIAGNOSIS — I4892 Unspecified atrial flutter: Secondary | ICD-10-CM | POA: Diagnosis not present

## 2020-05-31 DIAGNOSIS — I700 Atherosclerosis of aorta: Secondary | ICD-10-CM | POA: Diagnosis not present

## 2020-05-31 DIAGNOSIS — K5730 Diverticulosis of large intestine without perforation or abscess without bleeding: Secondary | ICD-10-CM | POA: Diagnosis not present

## 2020-05-31 DIAGNOSIS — M069 Rheumatoid arthritis, unspecified: Secondary | ICD-10-CM | POA: Diagnosis not present

## 2020-05-31 DIAGNOSIS — K565 Intestinal adhesions [bands], unspecified as to partial versus complete obstruction: Secondary | ICD-10-CM | POA: Diagnosis not present

## 2020-05-31 DIAGNOSIS — M419 Scoliosis, unspecified: Secondary | ICD-10-CM | POA: Diagnosis not present

## 2020-05-31 DIAGNOSIS — I110 Hypertensive heart disease with heart failure: Secondary | ICD-10-CM | POA: Diagnosis not present

## 2020-05-31 DIAGNOSIS — M47816 Spondylosis without myelopathy or radiculopathy, lumbar region: Secondary | ICD-10-CM | POA: Diagnosis not present

## 2020-05-31 DIAGNOSIS — Z85528 Personal history of other malignant neoplasm of kidney: Secondary | ICD-10-CM | POA: Diagnosis not present

## 2020-05-31 DIAGNOSIS — J45909 Unspecified asthma, uncomplicated: Secondary | ICD-10-CM | POA: Diagnosis not present

## 2020-05-31 DIAGNOSIS — M1A9XX0 Chronic gout, unspecified, without tophus (tophi): Secondary | ICD-10-CM | POA: Diagnosis not present

## 2020-05-31 DIAGNOSIS — Z8673 Personal history of transient ischemic attack (TIA), and cerebral infarction without residual deficits: Secondary | ICD-10-CM | POA: Diagnosis not present

## 2020-06-02 ENCOUNTER — Telehealth: Payer: Self-pay | Admitting: Family Medicine

## 2020-06-02 NOTE — Telephone Encounter (Signed)
[name] with [name] is calling in stating that the pt is wanting to cancel her OPT evaluation today and asked that it be moved to next week.

## 2020-06-03 DIAGNOSIS — E440 Moderate protein-calorie malnutrition: Secondary | ICD-10-CM | POA: Diagnosis not present

## 2020-06-03 DIAGNOSIS — I5042 Chronic combined systolic (congestive) and diastolic (congestive) heart failure: Secondary | ICD-10-CM | POA: Diagnosis not present

## 2020-06-03 DIAGNOSIS — K5909 Other constipation: Secondary | ICD-10-CM | POA: Diagnosis not present

## 2020-06-03 DIAGNOSIS — Z8673 Personal history of transient ischemic attack (TIA), and cerebral infarction without residual deficits: Secondary | ICD-10-CM | POA: Diagnosis not present

## 2020-06-03 DIAGNOSIS — M069 Rheumatoid arthritis, unspecified: Secondary | ICD-10-CM | POA: Diagnosis not present

## 2020-06-03 DIAGNOSIS — Z86010 Personal history of colonic polyps: Secondary | ICD-10-CM | POA: Diagnosis not present

## 2020-06-03 DIAGNOSIS — I110 Hypertensive heart disease with heart failure: Secondary | ICD-10-CM | POA: Diagnosis not present

## 2020-06-03 DIAGNOSIS — H548 Legal blindness, as defined in USA: Secondary | ICD-10-CM | POA: Diagnosis not present

## 2020-06-03 DIAGNOSIS — K219 Gastro-esophageal reflux disease without esophagitis: Secondary | ICD-10-CM | POA: Diagnosis not present

## 2020-06-03 DIAGNOSIS — M1A9XX0 Chronic gout, unspecified, without tophus (tophi): Secondary | ICD-10-CM | POA: Diagnosis not present

## 2020-06-03 DIAGNOSIS — M810 Age-related osteoporosis without current pathological fracture: Secondary | ICD-10-CM | POA: Diagnosis not present

## 2020-06-03 DIAGNOSIS — Z7982 Long term (current) use of aspirin: Secondary | ICD-10-CM | POA: Diagnosis not present

## 2020-06-03 DIAGNOSIS — Z85528 Personal history of other malignant neoplasm of kidney: Secondary | ICD-10-CM | POA: Diagnosis not present

## 2020-06-03 DIAGNOSIS — M160 Bilateral primary osteoarthritis of hip: Secondary | ICD-10-CM | POA: Diagnosis not present

## 2020-06-03 DIAGNOSIS — I4892 Unspecified atrial flutter: Secondary | ICD-10-CM | POA: Diagnosis not present

## 2020-06-03 DIAGNOSIS — M47816 Spondylosis without myelopathy or radiculopathy, lumbar region: Secondary | ICD-10-CM | POA: Diagnosis not present

## 2020-06-03 DIAGNOSIS — M419 Scoliosis, unspecified: Secondary | ICD-10-CM | POA: Diagnosis not present

## 2020-06-03 DIAGNOSIS — K5730 Diverticulosis of large intestine without perforation or abscess without bleeding: Secondary | ICD-10-CM | POA: Diagnosis not present

## 2020-06-03 DIAGNOSIS — Z933 Colostomy status: Secondary | ICD-10-CM | POA: Diagnosis not present

## 2020-06-03 DIAGNOSIS — K439 Ventral hernia without obstruction or gangrene: Secondary | ICD-10-CM | POA: Diagnosis not present

## 2020-06-03 DIAGNOSIS — K565 Intestinal adhesions [bands], unspecified as to partial versus complete obstruction: Secondary | ICD-10-CM | POA: Diagnosis not present

## 2020-06-03 DIAGNOSIS — I4891 Unspecified atrial fibrillation: Secondary | ICD-10-CM | POA: Diagnosis not present

## 2020-06-03 DIAGNOSIS — J45909 Unspecified asthma, uncomplicated: Secondary | ICD-10-CM | POA: Diagnosis not present

## 2020-06-03 DIAGNOSIS — I700 Atherosclerosis of aorta: Secondary | ICD-10-CM | POA: Diagnosis not present

## 2020-06-06 ENCOUNTER — Other Ambulatory Visit: Payer: Self-pay | Admitting: Family Medicine

## 2020-06-06 NOTE — Telephone Encounter (Signed)
[name] is calling and wanted to follow up with verbal orders for OPT. Therapist stated that she plans to see patient tomorrow and needs the order, please advise. CB is [phone]

## 2020-06-07 DIAGNOSIS — I700 Atherosclerosis of aorta: Secondary | ICD-10-CM | POA: Diagnosis not present

## 2020-06-07 DIAGNOSIS — Z8673 Personal history of transient ischemic attack (TIA), and cerebral infarction without residual deficits: Secondary | ICD-10-CM | POA: Diagnosis not present

## 2020-06-07 DIAGNOSIS — I110 Hypertensive heart disease with heart failure: Secondary | ICD-10-CM | POA: Diagnosis not present

## 2020-06-07 DIAGNOSIS — Z933 Colostomy status: Secondary | ICD-10-CM | POA: Diagnosis not present

## 2020-06-07 DIAGNOSIS — Z7982 Long term (current) use of aspirin: Secondary | ICD-10-CM | POA: Diagnosis not present

## 2020-06-07 DIAGNOSIS — M47816 Spondylosis without myelopathy or radiculopathy, lumbar region: Secondary | ICD-10-CM | POA: Diagnosis not present

## 2020-06-07 DIAGNOSIS — Z86010 Personal history of colonic polyps: Secondary | ICD-10-CM | POA: Diagnosis not present

## 2020-06-07 DIAGNOSIS — M160 Bilateral primary osteoarthritis of hip: Secondary | ICD-10-CM | POA: Diagnosis not present

## 2020-06-07 DIAGNOSIS — M1A9XX0 Chronic gout, unspecified, without tophus (tophi): Secondary | ICD-10-CM | POA: Diagnosis not present

## 2020-06-07 DIAGNOSIS — K219 Gastro-esophageal reflux disease without esophagitis: Secondary | ICD-10-CM | POA: Diagnosis not present

## 2020-06-07 DIAGNOSIS — M810 Age-related osteoporosis without current pathological fracture: Secondary | ICD-10-CM | POA: Diagnosis not present

## 2020-06-07 DIAGNOSIS — K5730 Diverticulosis of large intestine without perforation or abscess without bleeding: Secondary | ICD-10-CM | POA: Diagnosis not present

## 2020-06-07 DIAGNOSIS — K439 Ventral hernia without obstruction or gangrene: Secondary | ICD-10-CM | POA: Diagnosis not present

## 2020-06-07 DIAGNOSIS — H548 Legal blindness, as defined in USA: Secondary | ICD-10-CM | POA: Diagnosis not present

## 2020-06-07 DIAGNOSIS — M069 Rheumatoid arthritis, unspecified: Secondary | ICD-10-CM | POA: Diagnosis not present

## 2020-06-07 DIAGNOSIS — J45909 Unspecified asthma, uncomplicated: Secondary | ICD-10-CM | POA: Diagnosis not present

## 2020-06-07 DIAGNOSIS — Z85528 Personal history of other malignant neoplasm of kidney: Secondary | ICD-10-CM | POA: Diagnosis not present

## 2020-06-07 DIAGNOSIS — K565 Intestinal adhesions [bands], unspecified as to partial versus complete obstruction: Secondary | ICD-10-CM | POA: Diagnosis not present

## 2020-06-07 DIAGNOSIS — I4891 Unspecified atrial fibrillation: Secondary | ICD-10-CM | POA: Diagnosis not present

## 2020-06-07 DIAGNOSIS — I4892 Unspecified atrial flutter: Secondary | ICD-10-CM | POA: Diagnosis not present

## 2020-06-07 DIAGNOSIS — E440 Moderate protein-calorie malnutrition: Secondary | ICD-10-CM | POA: Diagnosis not present

## 2020-06-07 DIAGNOSIS — I5042 Chronic combined systolic (congestive) and diastolic (congestive) heart failure: Secondary | ICD-10-CM | POA: Diagnosis not present

## 2020-06-07 DIAGNOSIS — K5909 Other constipation: Secondary | ICD-10-CM | POA: Diagnosis not present

## 2020-06-07 DIAGNOSIS — M419 Scoliosis, unspecified: Secondary | ICD-10-CM | POA: Diagnosis not present

## 2020-06-08 ENCOUNTER — Other Ambulatory Visit: Payer: Self-pay | Admitting: Family Medicine

## 2020-06-08 NOTE — Telephone Encounter (Signed)
VO given.

## 2020-06-08 NOTE — Telephone Encounter (Signed)
Please okay this order  ?

## 2020-06-10 ENCOUNTER — Telehealth: Payer: Self-pay | Admitting: Pharmacist

## 2020-06-10 DIAGNOSIS — I4892 Unspecified atrial flutter: Secondary | ICD-10-CM | POA: Diagnosis not present

## 2020-06-10 DIAGNOSIS — Z86010 Personal history of colonic polyps: Secondary | ICD-10-CM | POA: Diagnosis not present

## 2020-06-10 DIAGNOSIS — Z933 Colostomy status: Secondary | ICD-10-CM | POA: Diagnosis not present

## 2020-06-10 DIAGNOSIS — M160 Bilateral primary osteoarthritis of hip: Secondary | ICD-10-CM | POA: Diagnosis not present

## 2020-06-10 DIAGNOSIS — Z85528 Personal history of other malignant neoplasm of kidney: Secondary | ICD-10-CM | POA: Diagnosis not present

## 2020-06-10 DIAGNOSIS — K5730 Diverticulosis of large intestine without perforation or abscess without bleeding: Secondary | ICD-10-CM | POA: Diagnosis not present

## 2020-06-10 DIAGNOSIS — I5042 Chronic combined systolic (congestive) and diastolic (congestive) heart failure: Secondary | ICD-10-CM | POA: Diagnosis not present

## 2020-06-10 DIAGNOSIS — K219 Gastro-esophageal reflux disease without esophagitis: Secondary | ICD-10-CM | POA: Diagnosis not present

## 2020-06-10 DIAGNOSIS — Z7982 Long term (current) use of aspirin: Secondary | ICD-10-CM | POA: Diagnosis not present

## 2020-06-10 DIAGNOSIS — E440 Moderate protein-calorie malnutrition: Secondary | ICD-10-CM | POA: Diagnosis not present

## 2020-06-10 DIAGNOSIS — H548 Legal blindness, as defined in USA: Secondary | ICD-10-CM | POA: Diagnosis not present

## 2020-06-10 DIAGNOSIS — K5909 Other constipation: Secondary | ICD-10-CM | POA: Diagnosis not present

## 2020-06-10 DIAGNOSIS — I4891 Unspecified atrial fibrillation: Secondary | ICD-10-CM | POA: Diagnosis not present

## 2020-06-10 DIAGNOSIS — Z8673 Personal history of transient ischemic attack (TIA), and cerebral infarction without residual deficits: Secondary | ICD-10-CM | POA: Diagnosis not present

## 2020-06-10 DIAGNOSIS — I110 Hypertensive heart disease with heart failure: Secondary | ICD-10-CM | POA: Diagnosis not present

## 2020-06-10 DIAGNOSIS — I700 Atherosclerosis of aorta: Secondary | ICD-10-CM | POA: Diagnosis not present

## 2020-06-10 DIAGNOSIS — M1A9XX0 Chronic gout, unspecified, without tophus (tophi): Secondary | ICD-10-CM | POA: Diagnosis not present

## 2020-06-10 DIAGNOSIS — M810 Age-related osteoporosis without current pathological fracture: Secondary | ICD-10-CM | POA: Diagnosis not present

## 2020-06-10 DIAGNOSIS — K565 Intestinal adhesions [bands], unspecified as to partial versus complete obstruction: Secondary | ICD-10-CM | POA: Diagnosis not present

## 2020-06-10 DIAGNOSIS — J45909 Unspecified asthma, uncomplicated: Secondary | ICD-10-CM | POA: Diagnosis not present

## 2020-06-10 DIAGNOSIS — M419 Scoliosis, unspecified: Secondary | ICD-10-CM | POA: Diagnosis not present

## 2020-06-10 DIAGNOSIS — K439 Ventral hernia without obstruction or gangrene: Secondary | ICD-10-CM | POA: Diagnosis not present

## 2020-06-10 DIAGNOSIS — M47816 Spondylosis without myelopathy or radiculopathy, lumbar region: Secondary | ICD-10-CM | POA: Diagnosis not present

## 2020-06-10 DIAGNOSIS — M069 Rheumatoid arthritis, unspecified: Secondary | ICD-10-CM | POA: Diagnosis not present

## 2020-06-13 DIAGNOSIS — M0549 Rheumatoid myopathy with rheumatoid arthritis of multiple sites: Secondary | ICD-10-CM | POA: Diagnosis not present

## 2020-06-14 DIAGNOSIS — M419 Scoliosis, unspecified: Secondary | ICD-10-CM | POA: Diagnosis not present

## 2020-06-14 DIAGNOSIS — K219 Gastro-esophageal reflux disease without esophagitis: Secondary | ICD-10-CM | POA: Diagnosis not present

## 2020-06-14 DIAGNOSIS — I4891 Unspecified atrial fibrillation: Secondary | ICD-10-CM | POA: Diagnosis not present

## 2020-06-14 DIAGNOSIS — Z86010 Personal history of colonic polyps: Secondary | ICD-10-CM | POA: Diagnosis not present

## 2020-06-14 DIAGNOSIS — Z85528 Personal history of other malignant neoplasm of kidney: Secondary | ICD-10-CM | POA: Diagnosis not present

## 2020-06-14 DIAGNOSIS — K5730 Diverticulosis of large intestine without perforation or abscess without bleeding: Secondary | ICD-10-CM | POA: Diagnosis not present

## 2020-06-14 DIAGNOSIS — E440 Moderate protein-calorie malnutrition: Secondary | ICD-10-CM | POA: Diagnosis not present

## 2020-06-14 DIAGNOSIS — M810 Age-related osteoporosis without current pathological fracture: Secondary | ICD-10-CM | POA: Diagnosis not present

## 2020-06-14 DIAGNOSIS — K5909 Other constipation: Secondary | ICD-10-CM | POA: Diagnosis not present

## 2020-06-14 DIAGNOSIS — M160 Bilateral primary osteoarthritis of hip: Secondary | ICD-10-CM | POA: Diagnosis not present

## 2020-06-14 DIAGNOSIS — Z7982 Long term (current) use of aspirin: Secondary | ICD-10-CM | POA: Diagnosis not present

## 2020-06-14 DIAGNOSIS — K565 Intestinal adhesions [bands], unspecified as to partial versus complete obstruction: Secondary | ICD-10-CM | POA: Diagnosis not present

## 2020-06-14 DIAGNOSIS — M47816 Spondylosis without myelopathy or radiculopathy, lumbar region: Secondary | ICD-10-CM | POA: Diagnosis not present

## 2020-06-14 DIAGNOSIS — Z933 Colostomy status: Secondary | ICD-10-CM | POA: Diagnosis not present

## 2020-06-14 DIAGNOSIS — Z8673 Personal history of transient ischemic attack (TIA), and cerebral infarction without residual deficits: Secondary | ICD-10-CM | POA: Diagnosis not present

## 2020-06-14 DIAGNOSIS — I700 Atherosclerosis of aorta: Secondary | ICD-10-CM | POA: Diagnosis not present

## 2020-06-14 DIAGNOSIS — I5042 Chronic combined systolic (congestive) and diastolic (congestive) heart failure: Secondary | ICD-10-CM | POA: Diagnosis not present

## 2020-06-14 DIAGNOSIS — I4892 Unspecified atrial flutter: Secondary | ICD-10-CM | POA: Diagnosis not present

## 2020-06-14 DIAGNOSIS — M1A9XX0 Chronic gout, unspecified, without tophus (tophi): Secondary | ICD-10-CM | POA: Diagnosis not present

## 2020-06-14 DIAGNOSIS — M069 Rheumatoid arthritis, unspecified: Secondary | ICD-10-CM | POA: Diagnosis not present

## 2020-06-14 DIAGNOSIS — K439 Ventral hernia without obstruction or gangrene: Secondary | ICD-10-CM | POA: Diagnosis not present

## 2020-06-14 DIAGNOSIS — J45909 Unspecified asthma, uncomplicated: Secondary | ICD-10-CM | POA: Diagnosis not present

## 2020-06-14 DIAGNOSIS — I110 Hypertensive heart disease with heart failure: Secondary | ICD-10-CM | POA: Diagnosis not present

## 2020-06-14 DIAGNOSIS — H548 Legal blindness, as defined in USA: Secondary | ICD-10-CM | POA: Diagnosis not present

## 2020-06-15 NOTE — Chronic Care Management (AMB) (Signed)
Chronic Care Management Pharmacy Assistant   Name: [name]  MRN: [id number] DOB: May 07, 1956  Reason for Encounter: Disease State/ BP Adherence Call   PCP : [name], MD  Allergies:   Allergies  Allergen Reactions  . Spinach Itching, Rash and Other (See Comments)    Welts.    Medications: Outpatient Encounter Medications as of 06/10/2020  Medication Sig  . Abatacept (ORENCIA) 125 MG/ML SOSY Inject 125 mg into the skin every Monday.   Marland Kitchen albuterol (VENTOLIN HFA) 108 (90 Base) MCG/ACT inhaler Inhale 1 puff into the lungs every 4 (four) hours as needed for wheezing or shortness of breath.  . allopurinol (ZYLOPRIM) 100 MG tablet TAKE 1 TABLET BY MOUTH EVERY DAY  . amLODipine (NORVASC) 10 MG tablet TAKE 1 TABLET BY MOUTH EVERY DAY  . aspirin EC 81 MG tablet Take 1 tablet (81 mg total) by mouth daily.  Marland Kitchen CALCIUM PO Take 1 tablet by mouth 2 (two) times daily.  . cetirizine (ZYRTEC) 10 MG tablet Take 1 tablet (10 mg total) by mouth daily.  . Cholecalciferol (VITAMIN D) 2000 units CAPS Take 2,000 Units by mouth daily.  Marland Kitchen CIPRODEX OTIC suspension INSTILL 4 DROPS UNTO EACH EAR TWICE DAILY AS NEEDED (Patient taking differently: Place 4 drops into both ears 2 (two) times daily as needed (ear drum).  Shake Well!  ** FOR THE EAR ONLY **)  . denosumab (PROLIA) 60 MG/ML SOSY injection Inject 60 mg into the skin every 6 (six) months.  . diclofenac sodium (VOLTAREN) 1 % GEL Apply 1 application topically 4 (four) times daily as needed (arthritis).  Marland Kitchen docusate sodium (COLACE) 100 MG capsule Take 100 mg by mouth 2 (two) times daily.  Marland Kitchen esomeprazole (NEXIUM) 40 MG capsule TAKE 1 CAPSULE(40 MG) BY MOUTH DAILY  . folic acid (FOLVITE) 403 MCG tablet Take 400 mcg by mouth daily.  Derrill Memo ON 06/20/2020] HYDROcodone-acetaminophen (NORCO) 10-325 MG tablet Take 1 tablet by mouth every 6 (six) hours as needed for moderate pain.  . megestrol (MEGACE ES) 625 MG/5ML suspension take 5 milliliters by  mouth three times daily before meals as needed for appetite  . Methotrexate Sodium (METHOTREXATE, PF,) 50 MG/2ML injection Inject 0.5 mLs into the muscle every Monday.  . Multiple Vitamin (MULTIVITAMIN WITH MINERALS) TABS tablet Take 1 tablet by mouth daily.  Marland Kitchen PERIDEX 0.12 % solution Use as directed 15 mLs in the mouth or throat 2 (two) times daily.   . polyethylene glycol (MIRALAX / GLYCOLAX) 17 g packet Take 17 g by mouth daily as needed for mild constipation.  Marland Kitchen tiZANidine (ZANAFLEX) 4 MG tablet TAKE 1 TABLET(4 MG) BY MOUTH TWICE DAILY  . traMADol (ULTRAM) 50 MG tablet TAKE 1 TO 2 TABLETS BY MOUTH EVERY 6 HOURS AS NEEDED (Patient taking differently: Take 50 mg by mouth in the morning and at bedtime. )  . vitamin B-12 (CYANOCOBALAMIN) 500 MCG tablet Take 500 mcg by mouth daily.  . vitamin C (ASCORBIC ACID) 500 MG tablet Take 500 mg by mouth daily.    No facility-administered encounter medications on file as of 06/10/2020.    Current Diagnosis: Patient Active Problem List   Diagnosis Date Noted  . Malnutrition of moderate degree 05/11/2020  . SBO (small bowel obstruction) ([place]) 05/07/2020  . Left renal mass 10/23/2017  . Hx of adenomatous polyp of colon 11/13/2016  . 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 Chronic combined systolic and diastolic heart failure ([place])   . Ventral  hernia without obstruction or gangrene 06/06/2015  . Renal mass   . Pulmonary HTN ([place]) 01/01/2014  . Iron deficiency anemia 01/01/2014  . Atrial flutter ([place]) 12/31/2013  . Encounter for ostomy care education 11/30/2013  . Stoma dermatitis 11/30/2013  . Incisional hernia 11/30/2013  . Constipation, chronic 11/30/2013  . Charcot's joint of foot 07/07/2013  . Bilateral leg edema 04/20/2013  . 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 Rheumatoid arthritis ([place]) 06/07/2009  . THYROID NODULE 07/01/2007  . LUNG NODULE 07/01/2007  . Osteoporosis  05/07/2007  . 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 Blindness 04/12/2007  . HEMORRHOIDS, INTERNAL 04/12/2007  . 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 Hyperlipidemia 01/07/2007  . Essential hypertension 01/07/2007  . GERD 01/07/2007  . Marfan's syndrome 01/07/2007  . Stroke ([place]) 07/2006  . Chronic gout 2008    Goals Addressed   None    Reviewed chart prior to disease state call. Spoke with patient regarding BP  Recent Office Vitals: BP Readings from Last 3 Encounters:  05/12/20 140/79  04/04/20 118/68  12/21/19 130/80   Pulse Readings from Last 3 Encounters:  05/12/20 78  04/04/20 76  12/21/19 94    Wt Readings from Last 3 Encounters:  05/07/20 154 lb 1.6 oz (69.9 kg)  10/19/19 145 lb (65.8 kg)  09/29/19 150 lb (68 kg)     Kidney Function Lab Results  Component Value Date/Time   CREATININE 0.51 05/12/2020 05:23 AM   CREATININE 0.56 05/11/2020 05:31 AM   CREATININE 0.76 11/19/2017 10:24 AM   GFR 107.53 04/20/2019 12:02 PM   GFRNONAA >60 05/12/2020 05:23 AM   GFRNONAA 85 11/19/2017 10:24 AM   GFRAA 98 11/19/2017 10:24 AM    BMP Latest Ref Rng & Units 05/12/2020 05/11/2020 05/10/2020  Glucose 70 - 99 mg/dL 90 95 95  BUN 8 - 23 mg/dL <5(L) <5(L) <5(L)  Creatinine 0.44 - 1.00 mg/dL 0.51 0.56 0.55  Sodium 135 - 145 mmol/L 138 140 139  Potassium 3.5 - 5.1 mmol/L 3.8 3.4(L) 3.6  Chloride 98 - 111 mmol/L 107 109 112(H)  CO2 22 - 32 mmol/L 22 23 22   Calcium 8.9 - 10.3 mg/dL 8.1(L) 8.5(L) 8.1(L)    . Current antihypertensive regimen:   Amlodipine 10 mg,1 tablet once daily  . How often are you checking your Blood Pressure? 1-2x per week . Current home BP readings:  o 12/16  122/80 o 12/17  132/70 o 12/21  128/76 . What recent interventions/DTPs have been made by any provider to improve Blood Pressure control since last CPP Visit: None . Any recent hospitalizations or ED visits since last visit with CPP? Yes  o 05-07-20 Hospital Admission . What diet changes  have been made to improve Blood Pressure Control?  o None . What exercise is being done to improve your Blood Pressure Control?  o No Change  Adherence Review: Is the patient currently on ACE/ARB medication? No Does the patient have >5 day gap between last estimated fill dates? No   Follow-Up:  Pharmacist Review   The patient denies any side effects with his medication. Also, denies any problems with his current pharmacy.She states that she has home health coming out twice a week. She has a follow-up with her PCP next week.  Maia Breslow, [place] Assistant [phone]

## 2020-06-16 DIAGNOSIS — K5730 Diverticulosis of large intestine without perforation or abscess without bleeding: Secondary | ICD-10-CM | POA: Diagnosis not present

## 2020-06-16 DIAGNOSIS — M1A9XX0 Chronic gout, unspecified, without tophus (tophi): Secondary | ICD-10-CM | POA: Diagnosis not present

## 2020-06-16 DIAGNOSIS — E440 Moderate protein-calorie malnutrition: Secondary | ICD-10-CM | POA: Diagnosis not present

## 2020-06-16 DIAGNOSIS — K565 Intestinal adhesions [bands], unspecified as to partial versus complete obstruction: Secondary | ICD-10-CM | POA: Diagnosis not present

## 2020-06-16 DIAGNOSIS — M069 Rheumatoid arthritis, unspecified: Secondary | ICD-10-CM | POA: Diagnosis not present

## 2020-06-16 DIAGNOSIS — Z8673 Personal history of transient ischemic attack (TIA), and cerebral infarction without residual deficits: Secondary | ICD-10-CM | POA: Diagnosis not present

## 2020-06-16 DIAGNOSIS — M160 Bilateral primary osteoarthritis of hip: Secondary | ICD-10-CM | POA: Diagnosis not present

## 2020-06-16 DIAGNOSIS — K439 Ventral hernia without obstruction or gangrene: Secondary | ICD-10-CM | POA: Diagnosis not present

## 2020-06-16 DIAGNOSIS — Z86010 Personal history of colonic polyps: Secondary | ICD-10-CM | POA: Diagnosis not present

## 2020-06-16 DIAGNOSIS — Z933 Colostomy status: Secondary | ICD-10-CM | POA: Diagnosis not present

## 2020-06-16 DIAGNOSIS — I110 Hypertensive heart disease with heart failure: Secondary | ICD-10-CM | POA: Diagnosis not present

## 2020-06-16 DIAGNOSIS — M419 Scoliosis, unspecified: Secondary | ICD-10-CM | POA: Diagnosis not present

## 2020-06-16 DIAGNOSIS — Z85528 Personal history of other malignant neoplasm of kidney: Secondary | ICD-10-CM | POA: Diagnosis not present

## 2020-06-16 DIAGNOSIS — I4891 Unspecified atrial fibrillation: Secondary | ICD-10-CM | POA: Diagnosis not present

## 2020-06-16 DIAGNOSIS — J45909 Unspecified asthma, uncomplicated: Secondary | ICD-10-CM | POA: Diagnosis not present

## 2020-06-16 DIAGNOSIS — Z7982 Long term (current) use of aspirin: Secondary | ICD-10-CM | POA: Diagnosis not present

## 2020-06-16 DIAGNOSIS — M810 Age-related osteoporosis without current pathological fracture: Secondary | ICD-10-CM | POA: Diagnosis not present

## 2020-06-16 DIAGNOSIS — H548 Legal blindness, as defined in USA: Secondary | ICD-10-CM | POA: Diagnosis not present

## 2020-06-16 DIAGNOSIS — I5042 Chronic combined systolic (congestive) and diastolic (congestive) heart failure: Secondary | ICD-10-CM | POA: Diagnosis not present

## 2020-06-16 DIAGNOSIS — I4892 Unspecified atrial flutter: Secondary | ICD-10-CM | POA: Diagnosis not present

## 2020-06-16 DIAGNOSIS — M47816 Spondylosis without myelopathy or radiculopathy, lumbar region: Secondary | ICD-10-CM | POA: Diagnosis not present

## 2020-06-16 DIAGNOSIS — K5909 Other constipation: Secondary | ICD-10-CM | POA: Diagnosis not present

## 2020-06-16 DIAGNOSIS — K219 Gastro-esophageal reflux disease without esophagitis: Secondary | ICD-10-CM | POA: Diagnosis not present

## 2020-06-16 DIAGNOSIS — I700 Atherosclerosis of aorta: Secondary | ICD-10-CM | POA: Diagnosis not present

## 2020-06-17 DIAGNOSIS — I700 Atherosclerosis of aorta: Secondary | ICD-10-CM | POA: Diagnosis not present

## 2020-06-17 DIAGNOSIS — M419 Scoliosis, unspecified: Secondary | ICD-10-CM | POA: Diagnosis not present

## 2020-06-17 DIAGNOSIS — K439 Ventral hernia without obstruction or gangrene: Secondary | ICD-10-CM | POA: Diagnosis not present

## 2020-06-17 DIAGNOSIS — Z7982 Long term (current) use of aspirin: Secondary | ICD-10-CM | POA: Diagnosis not present

## 2020-06-17 DIAGNOSIS — Z933 Colostomy status: Secondary | ICD-10-CM | POA: Diagnosis not present

## 2020-06-17 DIAGNOSIS — J45909 Unspecified asthma, uncomplicated: Secondary | ICD-10-CM | POA: Diagnosis not present

## 2020-06-17 DIAGNOSIS — M069 Rheumatoid arthritis, unspecified: Secondary | ICD-10-CM | POA: Diagnosis not present

## 2020-06-17 DIAGNOSIS — K565 Intestinal adhesions [bands], unspecified as to partial versus complete obstruction: Secondary | ICD-10-CM | POA: Diagnosis not present

## 2020-06-17 DIAGNOSIS — M810 Age-related osteoporosis without current pathological fracture: Secondary | ICD-10-CM | POA: Diagnosis not present

## 2020-06-17 DIAGNOSIS — M1A9XX0 Chronic gout, unspecified, without tophus (tophi): Secondary | ICD-10-CM | POA: Diagnosis not present

## 2020-06-17 DIAGNOSIS — M160 Bilateral primary osteoarthritis of hip: Secondary | ICD-10-CM | POA: Diagnosis not present

## 2020-06-17 DIAGNOSIS — I5042 Chronic combined systolic (congestive) and diastolic (congestive) heart failure: Secondary | ICD-10-CM | POA: Diagnosis not present

## 2020-06-17 DIAGNOSIS — I4891 Unspecified atrial fibrillation: Secondary | ICD-10-CM | POA: Diagnosis not present

## 2020-06-17 DIAGNOSIS — M47816 Spondylosis without myelopathy or radiculopathy, lumbar region: Secondary | ICD-10-CM | POA: Diagnosis not present

## 2020-06-17 DIAGNOSIS — Z86010 Personal history of colonic polyps: Secondary | ICD-10-CM | POA: Diagnosis not present

## 2020-06-17 DIAGNOSIS — K5909 Other constipation: Secondary | ICD-10-CM | POA: Diagnosis not present

## 2020-06-17 DIAGNOSIS — Z8673 Personal history of transient ischemic attack (TIA), and cerebral infarction without residual deficits: Secondary | ICD-10-CM | POA: Diagnosis not present

## 2020-06-17 DIAGNOSIS — I110 Hypertensive heart disease with heart failure: Secondary | ICD-10-CM | POA: Diagnosis not present

## 2020-06-17 DIAGNOSIS — K5730 Diverticulosis of large intestine without perforation or abscess without bleeding: Secondary | ICD-10-CM | POA: Diagnosis not present

## 2020-06-17 DIAGNOSIS — H548 Legal blindness, as defined in USA: Secondary | ICD-10-CM | POA: Diagnosis not present

## 2020-06-17 DIAGNOSIS — E440 Moderate protein-calorie malnutrition: Secondary | ICD-10-CM | POA: Diagnosis not present

## 2020-06-17 DIAGNOSIS — I4892 Unspecified atrial flutter: Secondary | ICD-10-CM | POA: Diagnosis not present

## 2020-06-17 DIAGNOSIS — Z85528 Personal history of other malignant neoplasm of kidney: Secondary | ICD-10-CM | POA: Diagnosis not present

## 2020-06-17 DIAGNOSIS — K219 Gastro-esophageal reflux disease without esophagitis: Secondary | ICD-10-CM | POA: Diagnosis not present

## 2020-06-20 ENCOUNTER — Other Ambulatory Visit: Payer: Self-pay

## 2020-06-20 ENCOUNTER — Encounter: Payer: Self-pay | Admitting: Family Medicine

## 2020-06-20 ENCOUNTER — Ambulatory Visit (INDEPENDENT_AMBULATORY_CARE_PROVIDER_SITE_OTHER): Payer: Medicare Other | Admitting: Family Medicine

## 2020-06-20 VITALS — BP 142/80 | HR 86

## 2020-06-20 DIAGNOSIS — R600 Localized edema: Secondary | ICD-10-CM

## 2020-06-20 DIAGNOSIS — I10 Essential (primary) hypertension: Secondary | ICD-10-CM

## 2020-06-20 DIAGNOSIS — F1190 Opioid use, unspecified, uncomplicated: Secondary | ICD-10-CM

## 2020-06-20 DIAGNOSIS — Z23 Encounter for immunization: Secondary | ICD-10-CM | POA: Diagnosis not present

## 2020-06-20 DIAGNOSIS — M1A9XX0 Chronic gout, unspecified, without tophus (tophi): Secondary | ICD-10-CM

## 2020-06-20 DIAGNOSIS — I5042 Chronic combined systolic (congestive) and diastolic (congestive) heart failure: Secondary | ICD-10-CM | POA: Diagnosis not present

## 2020-06-20 DIAGNOSIS — E041 Nontoxic single thyroid nodule: Secondary | ICD-10-CM

## 2020-06-20 DIAGNOSIS — M069 Rheumatoid arthritis, unspecified: Secondary | ICD-10-CM

## 2020-06-20 DIAGNOSIS — K219 Gastro-esophageal reflux disease without esophagitis: Secondary | ICD-10-CM

## 2020-06-20 DIAGNOSIS — E785 Hyperlipidemia, unspecified: Secondary | ICD-10-CM

## 2020-06-20 LAB — TSH: TSH: 1.93 u[IU]/mL (ref 0.35–4.50)

## 2020-06-20 LAB — LIPID PANEL
Cholesterol: 238 mg/dL — ABNORMAL HIGH (ref 0–200)
HDL: 76.1 mg/dL (ref >39.00)
LDL Cholesterol: 145 mg/dL — ABNORMAL HIGH (ref 0–99)
NonHDL: 161.92
Total CHOL/HDL Ratio: 3
Triglycerides: 86 mg/dL (ref 0.0–149.0)
VLDL: 17.2 mg/dL (ref 0.0–40.0)

## 2020-06-20 LAB — T4, FREE: Free T4: 0.94 ng/dL (ref 0.60–1.60)

## 2020-06-20 LAB — T3, FREE: T3, Free: 3.2 pg/mL (ref 2.3–4.2)

## 2020-06-20 LAB — URIC ACID: Uric Acid, Serum: 3.3 mg/dL (ref 2.4–7.0)

## 2020-06-20 MED ORDER — AMOXICILLIN 500 MG PO CAPS: 500.0000 mg | ORAL_CAPSULE | Freq: Three times a day (TID) | ORAL | 0 refills | Status: DC

## 2020-06-20 MED ORDER — ESOMEPRAZOLE MAGNESIUM 40 MG PO CPDR
40.0000 mg | DELAYED_RELEASE_CAPSULE | Freq: Every day | ORAL | 3 refills | Status: DC
Start: 2020-06-20 — End: 2021-05-24

## 2020-06-20 NOTE — Progress Notes (Signed)
Subjective:    Patient ID: [name], female    DOB: 08/03/1955, 64 y.o.   MRN: [id number]  HPI Here to follow up on issues. She is doing well except for some dental pain. For the past week she has had pain in the left lower jaw where she has a bad tooth. She is scheduled to see her oral surgeon in January, but her dentist is out of the office. No fever. She was hospitalized last month for a small bowel obstruction. This resolved on its own and she has done well since then with no pain. She takes [employer] museum/gallery and Miralax daily. Her BP is stable.    Review of Systems  Constitutional: Negative.   HENT: Negative.   Eyes: Negative.   Respiratory: Negative.   Cardiovascular: Negative.   Gastrointestinal: Negative.   Genitourinary: Negative for decreased urine volume, difficulty urinating, dyspareunia, dysuria, enuresis, flank pain, frequency, hematuria, pelvic pain and urgency.  Musculoskeletal: Positive for arthralgias.  Skin: Negative.   Neurological: Negative.   Psychiatric/Behavioral: Negative.        Objective:   Physical Exam Constitutional:      General: She is not in acute distress.    Appearance: She is well-developed and well-nourished.  HENT:     Head: Normocephalic and atraumatic.     Right Ear: External ear normal.     Left Ear: External ear normal.     Nose: Nose normal.     Mouth/Throat:     Mouth: Oropharynx is clear and moist.     Pharynx: No oropharyngeal exudate.     Comments: There is a broken off tooth in the left lower jaw and the gum around this is tender  Eyes:     General: No scleral icterus.    Extraocular Movements: EOM normal.     Conjunctiva/sclera: Conjunctivae normal.     Pupils: Pupils are equal, round, and reactive to light.  Neck:     Thyroid: No thyromegaly.     Vascular: No JVD.  Cardiovascular:     Rate and Rhythm: Normal rate and regular rhythm.     Pulses: Intact distal pulses.     Heart sounds: Normal heart sounds. No murmur  heard. No friction rub. No gallop.   Pulmonary:     Effort: Pulmonary effort is normal. No respiratory distress.     Breath sounds: Normal breath sounds. No wheezing or rales.  Chest:     Chest wall: No tenderness.  Abdominal:     General: Bowel sounds are normal. There is no distension.     Palpations: Abdomen is soft. There is no mass.     Tenderness: There is no abdominal tenderness. There is no guarding or rebound.  Musculoskeletal:        General: No edema.     Cervical back: Normal range of motion and neck supple.  Lymphadenopathy:     Cervical: No cervical adenopathy.  Skin:    General: Skin is warm and dry.     Findings: No erythema or rash.  Neurological:     Mental Status: She is alert and oriented to person, place, and time.     Cranial Nerves: No cranial nerve deficit.     Motor: No abnormal muscle tone.     Coordination: Coordination normal.     Deep Tendon Reflexes: Reflexes are normal and symmetric. Reflexes normal.  Psychiatric:        Mood and Affect: Mood and affect normal.  Behavior: Behavior normal.        Thought Content: Thought content normal.        Judgment: Judgment normal.           Assessment & Plan:  Her SBO has resolved. She has a dental abscess and we will treat this with 10 days of Amoxicillin. Her HTN and GERD and RA are stable. Get fasting labs to check thyroid levels and lipids. Given a flu shot.  [name], MD

## 2020-06-21 ENCOUNTER — Other Ambulatory Visit: Payer: Medicare Other

## 2020-06-21 DIAGNOSIS — Z933 Colostomy status: Secondary | ICD-10-CM | POA: Diagnosis not present

## 2020-06-21 DIAGNOSIS — H548 Legal blindness, as defined in USA: Secondary | ICD-10-CM | POA: Diagnosis not present

## 2020-06-21 DIAGNOSIS — I110 Hypertensive heart disease with heart failure: Secondary | ICD-10-CM | POA: Diagnosis not present

## 2020-06-21 DIAGNOSIS — Z8673 Personal history of transient ischemic attack (TIA), and cerebral infarction without residual deficits: Secondary | ICD-10-CM | POA: Diagnosis not present

## 2020-06-21 DIAGNOSIS — Z86010 Personal history of colonic polyps: Secondary | ICD-10-CM | POA: Diagnosis not present

## 2020-06-21 DIAGNOSIS — M810 Age-related osteoporosis without current pathological fracture: Secondary | ICD-10-CM | POA: Diagnosis not present

## 2020-06-21 DIAGNOSIS — Z85528 Personal history of other malignant neoplasm of kidney: Secondary | ICD-10-CM | POA: Diagnosis not present

## 2020-06-21 DIAGNOSIS — K5909 Other constipation: Secondary | ICD-10-CM | POA: Diagnosis not present

## 2020-06-21 DIAGNOSIS — I5042 Chronic combined systolic (congestive) and diastolic (congestive) heart failure: Secondary | ICD-10-CM | POA: Diagnosis not present

## 2020-06-21 DIAGNOSIS — K565 Intestinal adhesions [bands], unspecified as to partial versus complete obstruction: Secondary | ICD-10-CM | POA: Diagnosis not present

## 2020-06-21 DIAGNOSIS — I4891 Unspecified atrial fibrillation: Secondary | ICD-10-CM | POA: Diagnosis not present

## 2020-06-21 DIAGNOSIS — J45909 Unspecified asthma, uncomplicated: Secondary | ICD-10-CM | POA: Diagnosis not present

## 2020-06-21 DIAGNOSIS — M1A9XX0 Chronic gout, unspecified, without tophus (tophi): Secondary | ICD-10-CM | POA: Diagnosis not present

## 2020-06-21 DIAGNOSIS — F1190 Opioid use, unspecified, uncomplicated: Secondary | ICD-10-CM

## 2020-06-21 DIAGNOSIS — E440 Moderate protein-calorie malnutrition: Secondary | ICD-10-CM | POA: Diagnosis not present

## 2020-06-21 DIAGNOSIS — M160 Bilateral primary osteoarthritis of hip: Secondary | ICD-10-CM | POA: Diagnosis not present

## 2020-06-21 DIAGNOSIS — M419 Scoliosis, unspecified: Secondary | ICD-10-CM | POA: Diagnosis not present

## 2020-06-21 DIAGNOSIS — I700 Atherosclerosis of aorta: Secondary | ICD-10-CM | POA: Diagnosis not present

## 2020-06-21 DIAGNOSIS — K439 Ventral hernia without obstruction or gangrene: Secondary | ICD-10-CM | POA: Diagnosis not present

## 2020-06-21 DIAGNOSIS — M47816 Spondylosis without myelopathy or radiculopathy, lumbar region: Secondary | ICD-10-CM | POA: Diagnosis not present

## 2020-06-21 DIAGNOSIS — M069 Rheumatoid arthritis, unspecified: Secondary | ICD-10-CM | POA: Diagnosis not present

## 2020-06-21 DIAGNOSIS — I4892 Unspecified atrial flutter: Secondary | ICD-10-CM | POA: Diagnosis not present

## 2020-06-21 DIAGNOSIS — K5730 Diverticulosis of large intestine without perforation or abscess without bleeding: Secondary | ICD-10-CM | POA: Diagnosis not present

## 2020-06-21 DIAGNOSIS — Z7982 Long term (current) use of aspirin: Secondary | ICD-10-CM | POA: Diagnosis not present

## 2020-06-21 DIAGNOSIS — K219 Gastro-esophageal reflux disease without esophagitis: Secondary | ICD-10-CM | POA: Diagnosis not present

## 2020-06-21 NOTE — Addendum Note (Signed)
Addended by: Lerry Liner on: 06/21/2020 11:10 AM   Modules accepted: Orders

## 2020-06-23 DIAGNOSIS — K439 Ventral hernia without obstruction or gangrene: Secondary | ICD-10-CM | POA: Diagnosis not present

## 2020-06-23 DIAGNOSIS — Z933 Colostomy status: Secondary | ICD-10-CM | POA: Diagnosis not present

## 2020-06-23 DIAGNOSIS — K565 Intestinal adhesions [bands], unspecified as to partial versus complete obstruction: Secondary | ICD-10-CM | POA: Diagnosis not present

## 2020-06-23 DIAGNOSIS — M810 Age-related osteoporosis without current pathological fracture: Secondary | ICD-10-CM | POA: Diagnosis not present

## 2020-06-23 DIAGNOSIS — I4892 Unspecified atrial flutter: Secondary | ICD-10-CM | POA: Diagnosis not present

## 2020-06-23 DIAGNOSIS — K219 Gastro-esophageal reflux disease without esophagitis: Secondary | ICD-10-CM | POA: Diagnosis not present

## 2020-06-23 DIAGNOSIS — I5042 Chronic combined systolic (congestive) and diastolic (congestive) heart failure: Secondary | ICD-10-CM | POA: Diagnosis not present

## 2020-06-23 DIAGNOSIS — I4891 Unspecified atrial fibrillation: Secondary | ICD-10-CM | POA: Diagnosis not present

## 2020-06-23 DIAGNOSIS — Z86010 Personal history of colonic polyps: Secondary | ICD-10-CM | POA: Diagnosis not present

## 2020-06-23 DIAGNOSIS — M069 Rheumatoid arthritis, unspecified: Secondary | ICD-10-CM | POA: Diagnosis not present

## 2020-06-23 DIAGNOSIS — Z8673 Personal history of transient ischemic attack (TIA), and cerebral infarction without residual deficits: Secondary | ICD-10-CM | POA: Diagnosis not present

## 2020-06-23 DIAGNOSIS — M419 Scoliosis, unspecified: Secondary | ICD-10-CM | POA: Diagnosis not present

## 2020-06-23 DIAGNOSIS — J45909 Unspecified asthma, uncomplicated: Secondary | ICD-10-CM | POA: Diagnosis not present

## 2020-06-23 DIAGNOSIS — E440 Moderate protein-calorie malnutrition: Secondary | ICD-10-CM | POA: Diagnosis not present

## 2020-06-23 DIAGNOSIS — I110 Hypertensive heart disease with heart failure: Secondary | ICD-10-CM | POA: Diagnosis not present

## 2020-06-23 DIAGNOSIS — M47816 Spondylosis without myelopathy or radiculopathy, lumbar region: Secondary | ICD-10-CM | POA: Diagnosis not present

## 2020-06-23 DIAGNOSIS — Z7982 Long term (current) use of aspirin: Secondary | ICD-10-CM | POA: Diagnosis not present

## 2020-06-23 DIAGNOSIS — Z85528 Personal history of other malignant neoplasm of kidney: Secondary | ICD-10-CM | POA: Diagnosis not present

## 2020-06-23 DIAGNOSIS — H548 Legal blindness, as defined in USA: Secondary | ICD-10-CM | POA: Diagnosis not present

## 2020-06-23 DIAGNOSIS — K5730 Diverticulosis of large intestine without perforation or abscess without bleeding: Secondary | ICD-10-CM | POA: Diagnosis not present

## 2020-06-23 DIAGNOSIS — I700 Atherosclerosis of aorta: Secondary | ICD-10-CM | POA: Diagnosis not present

## 2020-06-23 DIAGNOSIS — M160 Bilateral primary osteoarthritis of hip: Secondary | ICD-10-CM | POA: Diagnosis not present

## 2020-06-23 DIAGNOSIS — M1A9XX0 Chronic gout, unspecified, without tophus (tophi): Secondary | ICD-10-CM | POA: Diagnosis not present

## 2020-06-23 DIAGNOSIS — K5909 Other constipation: Secondary | ICD-10-CM | POA: Diagnosis not present

## 2020-06-23 LAB — DRUG MONITOR, PANEL 1, W/CONF, URINE
Amphetamines: NEGATIVE ng/mL (ref <500)
Barbiturates: NEGATIVE ng/mL (ref <300)
Benzodiazepines: NEGATIVE ng/mL (ref <100)
Cocaine Metabolite: NEGATIVE ng/mL (ref <150)
Codeine: NEGATIVE ng/mL (ref <50)
Creatinine: 30.4 mg/dL
Hydrocodone: 263 ng/mL — ABNORMAL HIGH (ref <50)
Hydromorphone: NEGATIVE ng/mL (ref <50)
Marijuana Metabolite: NEGATIVE ng/mL (ref <20)
Methadone Metabolite: NEGATIVE ng/mL (ref <100)
Morphine: NEGATIVE ng/mL (ref <50)
Norhydrocodone: 625 ng/mL — ABNORMAL HIGH (ref <50)
Opiates: POSITIVE ng/mL — AB (ref <100)
Oxidant: NEGATIVE ug/mL
Oxycodone: NEGATIVE ng/mL (ref <100)
Phencyclidine: NEGATIVE ng/mL (ref <25)
pH: 6.3 (ref 4.5–9.0)

## 2020-06-23 LAB — DM TEMPLATE

## 2020-06-24 DIAGNOSIS — I700 Atherosclerosis of aorta: Secondary | ICD-10-CM | POA: Diagnosis not present

## 2020-06-24 DIAGNOSIS — M810 Age-related osteoporosis without current pathological fracture: Secondary | ICD-10-CM | POA: Diagnosis not present

## 2020-06-24 DIAGNOSIS — H548 Legal blindness, as defined in USA: Secondary | ICD-10-CM | POA: Diagnosis not present

## 2020-06-24 DIAGNOSIS — K5730 Diverticulosis of large intestine without perforation or abscess without bleeding: Secondary | ICD-10-CM | POA: Diagnosis not present

## 2020-06-24 DIAGNOSIS — I5042 Chronic combined systolic (congestive) and diastolic (congestive) heart failure: Secondary | ICD-10-CM | POA: Diagnosis not present

## 2020-06-24 DIAGNOSIS — E440 Moderate protein-calorie malnutrition: Secondary | ICD-10-CM | POA: Diagnosis not present

## 2020-06-24 DIAGNOSIS — J45909 Unspecified asthma, uncomplicated: Secondary | ICD-10-CM | POA: Diagnosis not present

## 2020-06-24 DIAGNOSIS — M160 Bilateral primary osteoarthritis of hip: Secondary | ICD-10-CM | POA: Diagnosis not present

## 2020-06-24 DIAGNOSIS — Z8673 Personal history of transient ischemic attack (TIA), and cerebral infarction without residual deficits: Secondary | ICD-10-CM | POA: Diagnosis not present

## 2020-06-24 DIAGNOSIS — Z86010 Personal history of colonic polyps: Secondary | ICD-10-CM | POA: Diagnosis not present

## 2020-06-24 DIAGNOSIS — K439 Ventral hernia without obstruction or gangrene: Secondary | ICD-10-CM | POA: Diagnosis not present

## 2020-06-24 DIAGNOSIS — K565 Intestinal adhesions [bands], unspecified as to partial versus complete obstruction: Secondary | ICD-10-CM | POA: Diagnosis not present

## 2020-06-24 DIAGNOSIS — M1A9XX0 Chronic gout, unspecified, without tophus (tophi): Secondary | ICD-10-CM | POA: Diagnosis not present

## 2020-06-24 DIAGNOSIS — M47816 Spondylosis without myelopathy or radiculopathy, lumbar region: Secondary | ICD-10-CM | POA: Diagnosis not present

## 2020-06-24 DIAGNOSIS — M069 Rheumatoid arthritis, unspecified: Secondary | ICD-10-CM | POA: Diagnosis not present

## 2020-06-24 DIAGNOSIS — I4892 Unspecified atrial flutter: Secondary | ICD-10-CM | POA: Diagnosis not present

## 2020-06-24 DIAGNOSIS — Z933 Colostomy status: Secondary | ICD-10-CM | POA: Diagnosis not present

## 2020-06-24 DIAGNOSIS — I110 Hypertensive heart disease with heart failure: Secondary | ICD-10-CM | POA: Diagnosis not present

## 2020-06-24 DIAGNOSIS — Z7982 Long term (current) use of aspirin: Secondary | ICD-10-CM | POA: Diagnosis not present

## 2020-06-24 DIAGNOSIS — K5909 Other constipation: Secondary | ICD-10-CM | POA: Diagnosis not present

## 2020-06-24 DIAGNOSIS — K219 Gastro-esophageal reflux disease without esophagitis: Secondary | ICD-10-CM | POA: Diagnosis not present

## 2020-06-24 DIAGNOSIS — M419 Scoliosis, unspecified: Secondary | ICD-10-CM | POA: Diagnosis not present

## 2020-06-24 DIAGNOSIS — I4891 Unspecified atrial fibrillation: Secondary | ICD-10-CM | POA: Diagnosis not present

## 2020-06-24 DIAGNOSIS — Z85528 Personal history of other malignant neoplasm of kidney: Secondary | ICD-10-CM | POA: Diagnosis not present

## 2020-06-27 DIAGNOSIS — Z933 Colostomy status: Secondary | ICD-10-CM | POA: Diagnosis not present

## 2020-06-28 DIAGNOSIS — Z86010 Personal history of colonic polyps: Secondary | ICD-10-CM | POA: Diagnosis not present

## 2020-06-28 DIAGNOSIS — I4892 Unspecified atrial flutter: Secondary | ICD-10-CM | POA: Diagnosis not present

## 2020-06-28 DIAGNOSIS — I5042 Chronic combined systolic (congestive) and diastolic (congestive) heart failure: Secondary | ICD-10-CM | POA: Diagnosis not present

## 2020-06-28 DIAGNOSIS — M810 Age-related osteoporosis without current pathological fracture: Secondary | ICD-10-CM | POA: Diagnosis not present

## 2020-06-28 DIAGNOSIS — M160 Bilateral primary osteoarthritis of hip: Secondary | ICD-10-CM | POA: Diagnosis not present

## 2020-06-28 DIAGNOSIS — Z933 Colostomy status: Secondary | ICD-10-CM | POA: Diagnosis not present

## 2020-06-28 DIAGNOSIS — I700 Atherosclerosis of aorta: Secondary | ICD-10-CM | POA: Diagnosis not present

## 2020-06-28 DIAGNOSIS — M419 Scoliosis, unspecified: Secondary | ICD-10-CM | POA: Diagnosis not present

## 2020-06-28 DIAGNOSIS — M069 Rheumatoid arthritis, unspecified: Secondary | ICD-10-CM | POA: Diagnosis not present

## 2020-06-28 DIAGNOSIS — Z8673 Personal history of transient ischemic attack (TIA), and cerebral infarction without residual deficits: Secondary | ICD-10-CM | POA: Diagnosis not present

## 2020-06-28 DIAGNOSIS — M47816 Spondylosis without myelopathy or radiculopathy, lumbar region: Secondary | ICD-10-CM | POA: Diagnosis not present

## 2020-06-28 DIAGNOSIS — E440 Moderate protein-calorie malnutrition: Secondary | ICD-10-CM | POA: Diagnosis not present

## 2020-06-28 DIAGNOSIS — I110 Hypertensive heart disease with heart failure: Secondary | ICD-10-CM | POA: Diagnosis not present

## 2020-06-28 DIAGNOSIS — K565 Intestinal adhesions [bands], unspecified as to partial versus complete obstruction: Secondary | ICD-10-CM | POA: Diagnosis not present

## 2020-06-28 DIAGNOSIS — H548 Legal blindness, as defined in USA: Secondary | ICD-10-CM | POA: Diagnosis not present

## 2020-06-28 DIAGNOSIS — K5909 Other constipation: Secondary | ICD-10-CM | POA: Diagnosis not present

## 2020-06-28 DIAGNOSIS — K5730 Diverticulosis of large intestine without perforation or abscess without bleeding: Secondary | ICD-10-CM | POA: Diagnosis not present

## 2020-06-28 DIAGNOSIS — K439 Ventral hernia without obstruction or gangrene: Secondary | ICD-10-CM | POA: Diagnosis not present

## 2020-06-28 DIAGNOSIS — Z85528 Personal history of other malignant neoplasm of kidney: Secondary | ICD-10-CM | POA: Diagnosis not present

## 2020-06-28 DIAGNOSIS — M1A9XX0 Chronic gout, unspecified, without tophus (tophi): Secondary | ICD-10-CM | POA: Diagnosis not present

## 2020-06-28 DIAGNOSIS — I4891 Unspecified atrial fibrillation: Secondary | ICD-10-CM | POA: Diagnosis not present

## 2020-06-28 DIAGNOSIS — Z7982 Long term (current) use of aspirin: Secondary | ICD-10-CM | POA: Diagnosis not present

## 2020-06-28 DIAGNOSIS — J45909 Unspecified asthma, uncomplicated: Secondary | ICD-10-CM | POA: Diagnosis not present

## 2020-06-28 DIAGNOSIS — K219 Gastro-esophageal reflux disease without esophagitis: Secondary | ICD-10-CM | POA: Diagnosis not present

## 2020-06-29 ENCOUNTER — Telehealth: Payer: Self-pay | Admitting: Family Medicine

## 2020-06-29 NOTE — Telephone Encounter (Signed)
[name] is calling and wanted to see if the provider received orders and if so if they could be signed and sent back, please advise. CB is [phone]

## 2020-06-30 DIAGNOSIS — M47816 Spondylosis without myelopathy or radiculopathy, lumbar region: Secondary | ICD-10-CM | POA: Diagnosis not present

## 2020-06-30 DIAGNOSIS — Z933 Colostomy status: Secondary | ICD-10-CM | POA: Diagnosis not present

## 2020-06-30 DIAGNOSIS — E440 Moderate protein-calorie malnutrition: Secondary | ICD-10-CM | POA: Diagnosis not present

## 2020-06-30 DIAGNOSIS — M069 Rheumatoid arthritis, unspecified: Secondary | ICD-10-CM | POA: Diagnosis not present

## 2020-06-30 DIAGNOSIS — Z86010 Personal history of colonic polyps: Secondary | ICD-10-CM | POA: Diagnosis not present

## 2020-06-30 DIAGNOSIS — Z8673 Personal history of transient ischemic attack (TIA), and cerebral infarction without residual deficits: Secondary | ICD-10-CM | POA: Diagnosis not present

## 2020-06-30 DIAGNOSIS — I5042 Chronic combined systolic (congestive) and diastolic (congestive) heart failure: Secondary | ICD-10-CM | POA: Diagnosis not present

## 2020-06-30 DIAGNOSIS — M419 Scoliosis, unspecified: Secondary | ICD-10-CM | POA: Diagnosis not present

## 2020-06-30 DIAGNOSIS — M810 Age-related osteoporosis without current pathological fracture: Secondary | ICD-10-CM | POA: Diagnosis not present

## 2020-06-30 DIAGNOSIS — I110 Hypertensive heart disease with heart failure: Secondary | ICD-10-CM | POA: Diagnosis not present

## 2020-06-30 DIAGNOSIS — I4891 Unspecified atrial fibrillation: Secondary | ICD-10-CM | POA: Diagnosis not present

## 2020-06-30 DIAGNOSIS — M1A9XX0 Chronic gout, unspecified, without tophus (tophi): Secondary | ICD-10-CM | POA: Diagnosis not present

## 2020-06-30 DIAGNOSIS — K5909 Other constipation: Secondary | ICD-10-CM | POA: Diagnosis not present

## 2020-06-30 DIAGNOSIS — K219 Gastro-esophageal reflux disease without esophagitis: Secondary | ICD-10-CM | POA: Diagnosis not present

## 2020-06-30 DIAGNOSIS — H548 Legal blindness, as defined in USA: Secondary | ICD-10-CM | POA: Diagnosis not present

## 2020-06-30 DIAGNOSIS — Z7982 Long term (current) use of aspirin: Secondary | ICD-10-CM | POA: Diagnosis not present

## 2020-06-30 DIAGNOSIS — I700 Atherosclerosis of aorta: Secondary | ICD-10-CM | POA: Diagnosis not present

## 2020-06-30 DIAGNOSIS — K439 Ventral hernia without obstruction or gangrene: Secondary | ICD-10-CM | POA: Diagnosis not present

## 2020-06-30 DIAGNOSIS — J45909 Unspecified asthma, uncomplicated: Secondary | ICD-10-CM | POA: Diagnosis not present

## 2020-06-30 DIAGNOSIS — I4892 Unspecified atrial flutter: Secondary | ICD-10-CM | POA: Diagnosis not present

## 2020-06-30 DIAGNOSIS — K565 Intestinal adhesions [bands], unspecified as to partial versus complete obstruction: Secondary | ICD-10-CM | POA: Diagnosis not present

## 2020-06-30 DIAGNOSIS — M160 Bilateral primary osteoarthritis of hip: Secondary | ICD-10-CM | POA: Diagnosis not present

## 2020-06-30 DIAGNOSIS — Z85528 Personal history of other malignant neoplasm of kidney: Secondary | ICD-10-CM | POA: Diagnosis not present

## 2020-06-30 DIAGNOSIS — K5730 Diverticulosis of large intestine without perforation or abscess without bleeding: Secondary | ICD-10-CM | POA: Diagnosis not present

## 2020-06-30 NOTE — Telephone Encounter (Signed)
Dr. Clent Ridges have you received any orders for this patient.

## 2020-07-01 NOTE — Telephone Encounter (Signed)
Fax sent and confirmed. 

## 2020-07-01 NOTE — Telephone Encounter (Signed)
The form is ready to be faxed  

## 2020-07-04 ENCOUNTER — Encounter (HOSPITAL_COMMUNITY): Payer: Self-pay

## 2020-07-04 ENCOUNTER — Inpatient Hospital Stay (HOSPITAL_COMMUNITY)
Admission: EM | Admit: 2020-07-04 | Discharge: 2020-07-09 | DRG: 388 | Disposition: A | Discharge status: Home / Self Care | Payer: Medicare Other | Attending: Internal Medicine | Admitting: Internal Medicine

## 2020-07-04 ENCOUNTER — Emergency Department (HOSPITAL_COMMUNITY): Payer: Medicare Other

## 2020-07-04 DIAGNOSIS — U071 COVID-19: Secondary | ICD-10-CM | POA: Diagnosis not present

## 2020-07-04 DIAGNOSIS — K56600 Partial intestinal obstruction, unspecified as to cause: Secondary | ICD-10-CM | POA: Diagnosis not present

## 2020-07-04 DIAGNOSIS — Z79899 Other long term (current) drug therapy: Secondary | ICD-10-CM | POA: Diagnosis not present

## 2020-07-04 DIAGNOSIS — Z87891 Personal history of nicotine dependence: Secondary | ICD-10-CM

## 2020-07-04 DIAGNOSIS — F1021 Alcohol dependence, in remission: Secondary | ICD-10-CM | POA: Diagnosis present

## 2020-07-04 DIAGNOSIS — K5900 Constipation, unspecified: Secondary | ICD-10-CM | POA: Diagnosis not present

## 2020-07-04 DIAGNOSIS — Z8673 Personal history of transient ischemic attack (TIA), and cerebral infarction without residual deficits: Secondary | ICD-10-CM | POA: Diagnosis not present

## 2020-07-04 DIAGNOSIS — I10 Essential (primary) hypertension: Secondary | ICD-10-CM | POA: Diagnosis present

## 2020-07-04 DIAGNOSIS — K5730 Diverticulosis of large intestine without perforation or abscess without bleeding: Secondary | ICD-10-CM | POA: Diagnosis not present

## 2020-07-04 DIAGNOSIS — Z85528 Personal history of other malignant neoplasm of kidney: Secondary | ICD-10-CM | POA: Diagnosis not present

## 2020-07-04 DIAGNOSIS — I110 Hypertensive heart disease with heart failure: Secondary | ICD-10-CM | POA: Diagnosis not present

## 2020-07-04 DIAGNOSIS — R52 Pain, unspecified: Secondary | ICD-10-CM | POA: Diagnosis not present

## 2020-07-04 DIAGNOSIS — N179 Acute kidney failure, unspecified: Secondary | ICD-10-CM | POA: Diagnosis present

## 2020-07-04 DIAGNOSIS — H9190 Unspecified hearing loss, unspecified ear: Secondary | ICD-10-CM | POA: Diagnosis not present

## 2020-07-04 DIAGNOSIS — Z96653 Presence of artificial knee joint, bilateral: Secondary | ICD-10-CM | POA: Diagnosis present

## 2020-07-04 DIAGNOSIS — Z743 Need for continuous supervision: Secondary | ICD-10-CM | POA: Diagnosis not present

## 2020-07-04 DIAGNOSIS — M810 Age-related osteoporosis without current pathological fracture: Secondary | ICD-10-CM | POA: Diagnosis present

## 2020-07-04 DIAGNOSIS — K5720 Diverticulitis of large intestine with perforation and abscess without bleeding: Secondary | ICD-10-CM | POA: Diagnosis not present

## 2020-07-04 DIAGNOSIS — Z974 Presence of external hearing-aid: Secondary | ICD-10-CM | POA: Diagnosis not present

## 2020-07-04 DIAGNOSIS — Z9049 Acquired absence of other specified parts of digestive tract: Secondary | ICD-10-CM | POA: Diagnosis not present

## 2020-07-04 DIAGNOSIS — K56609 Unspecified intestinal obstruction, unspecified as to partial versus complete obstruction: Secondary | ICD-10-CM | POA: Diagnosis not present

## 2020-07-04 DIAGNOSIS — M069 Rheumatoid arthritis, unspecified: Secondary | ICD-10-CM | POA: Diagnosis not present

## 2020-07-04 DIAGNOSIS — Z86010 Personal history of colonic polyps: Secondary | ICD-10-CM | POA: Diagnosis not present

## 2020-07-04 DIAGNOSIS — R110 Nausea: Secondary | ICD-10-CM | POA: Diagnosis not present

## 2020-07-04 DIAGNOSIS — Z7982 Long term (current) use of aspirin: Secondary | ICD-10-CM

## 2020-07-04 DIAGNOSIS — E860 Dehydration: Secondary | ICD-10-CM | POA: Diagnosis present

## 2020-07-04 DIAGNOSIS — K439 Ventral hernia without obstruction or gangrene: Secondary | ICD-10-CM | POA: Diagnosis present

## 2020-07-04 DIAGNOSIS — R1111 Vomiting without nausea: Secondary | ICD-10-CM | POA: Diagnosis not present

## 2020-07-04 DIAGNOSIS — I4892 Unspecified atrial flutter: Secondary | ICD-10-CM | POA: Diagnosis present

## 2020-07-04 DIAGNOSIS — I5032 Chronic diastolic (congestive) heart failure: Secondary | ICD-10-CM | POA: Diagnosis not present

## 2020-07-04 DIAGNOSIS — E871 Hypo-osmolality and hyponatremia: Secondary | ICD-10-CM | POA: Diagnosis not present

## 2020-07-04 DIAGNOSIS — I5042 Chronic combined systolic (congestive) and diastolic (congestive) heart failure: Secondary | ICD-10-CM | POA: Diagnosis present

## 2020-07-04 DIAGNOSIS — Z91018 Allergy to other foods: Secondary | ICD-10-CM

## 2020-07-04 DIAGNOSIS — M1A9XX0 Chronic gout, unspecified, without tophus (tophi): Secondary | ICD-10-CM | POA: Diagnosis present

## 2020-07-04 DIAGNOSIS — H548 Legal blindness, as defined in USA: Secondary | ICD-10-CM | POA: Diagnosis present

## 2020-07-04 DIAGNOSIS — Z933 Colostomy status: Secondary | ICD-10-CM

## 2020-07-04 DIAGNOSIS — E861 Hypovolemia: Secondary | ICD-10-CM | POA: Diagnosis present

## 2020-07-04 DIAGNOSIS — N323 Diverticulum of bladder: Secondary | ICD-10-CM | POA: Diagnosis not present

## 2020-07-04 DIAGNOSIS — K5669 Other partial intestinal obstruction: Secondary | ICD-10-CM | POA: Diagnosis not present

## 2020-07-04 DIAGNOSIS — R1110 Vomiting, unspecified: Secondary | ICD-10-CM | POA: Diagnosis not present

## 2020-07-04 HISTORY — DX: COVID-19: U07.1

## 2020-07-04 LAB — CBC
HCT: 38.2 % (ref 36.0–46.0)
Hemoglobin: 12.5 g/dL (ref 12.0–15.0)
MCH: 30.6 pg (ref 26.0–34.0)
MCHC: 32.7 g/dL (ref 30.0–36.0)
MCV: 93.4 fL (ref 80.0–100.0)
Platelets: 264 10*3/uL (ref 150–400)
RBC: 4.09 MIL/uL (ref 3.87–5.11)
RDW: 14.5 % (ref 11.5–15.5)
WBC: 7.1 10*3/uL (ref 4.0–10.5)
nRBC: 0 % (ref 0.0–0.2)

## 2020-07-04 LAB — COMPREHENSIVE METABOLIC PANEL
ALT: 26 U/L (ref 0–44)
AST: 32 U/L (ref 15–41)
Albumin: 4 g/dL (ref 3.5–5.0)
Alkaline Phosphatase: 79 U/L (ref 38–126)
Anion gap: 11 (ref 5–15)
BUN: 12 mg/dL (ref 8–23)
CO2: 28 mmol/L (ref 22–32)
Calcium: 9.6 mg/dL (ref 8.9–10.3)
Chloride: 100 mmol/L (ref 98–111)
Creatinine, Ser: 1.32 mg/dL — ABNORMAL HIGH (ref 0.44–1.00)
GFR, Estimated: 45 mL/min — ABNORMAL LOW (ref >60)
Glucose, Bld: 116 mg/dL — ABNORMAL HIGH (ref 70–99)
Potassium: 3.7 mmol/L (ref 3.5–5.1)
Sodium: 139 mmol/L (ref 135–145)
Total Bilirubin: 0.8 mg/dL (ref 0.3–1.2)
Total Protein: 7.3 g/dL (ref 6.5–8.1)

## 2020-07-04 LAB — RESP PANEL BY RT-PCR (FLU A&B, COVID) ARPGX2
Influenza A by PCR: NEGATIVE
Influenza B by PCR: NEGATIVE
SARS Coronavirus 2 by RT PCR: POSITIVE — AB

## 2020-07-04 LAB — LIPASE, BLOOD: Lipase: 29 U/L (ref 11–51)

## 2020-07-04 MED ORDER — ONDANSETRON HCL 4 MG/2ML IJ SOLN
4.0000 mg | Freq: Four times a day (QID) | INTRAMUSCULAR | Status: DC | PRN
  Administered 2020-07-05 – 2020-07-08 (×4): 4 mg via INTRAVENOUS
  Filled 2020-07-04 (×5): qty 2

## 2020-07-04 MED ORDER — MORPHINE SULFATE (PF) 2 MG/ML IV SOLN
1.0000 mg | INTRAVENOUS | Status: DC | PRN
  Administered 2020-07-05 – 2020-07-08 (×9): 1 mg via INTRAVENOUS
  Filled 2020-07-04 (×10): qty 1

## 2020-07-04 MED ORDER — ONDANSETRON HCL 4 MG PO TABS: 4.0000 mg | ORAL_TABLET | Freq: Four times a day (QID) | ORAL | Status: DC | PRN

## 2020-07-04 MED ORDER — ENOXAPARIN SODIUM 40 MG/0.4ML ~~LOC~~ SOLN
40.0000 mg | SUBCUTANEOUS | Status: DC
  Administered 2020-07-04 – 2020-07-08 (×5): 40 mg via SUBCUTANEOUS
  Filled 2020-07-04 (×5): qty 0.4

## 2020-07-04 MED ORDER — ONDANSETRON HCL 4 MG/2ML IJ SOLN
4.0000 mg | Freq: Once | INTRAMUSCULAR | Status: AC
  Administered 2020-07-04: 4 mg via INTRAVENOUS
  Filled 2020-07-04: qty 2

## 2020-07-04 MED ORDER — SODIUM CHLORIDE 0.9 % IV SOLN: INTRAVENOUS | Status: AC

## 2020-07-04 MED ORDER — MORPHINE SULFATE (PF) 4 MG/ML IV SOLN
4.0000 mg | Freq: Once | INTRAVENOUS | Status: AC
  Administered 2020-07-04: 4 mg via INTRAVENOUS
  Filled 2020-07-04: qty 1

## 2020-07-04 MED ORDER — IOHEXOL 300 MG/ML  SOLN
100.0000 mL | Freq: Once | INTRAMUSCULAR | Status: AC | PRN
  Administered 2020-07-04: 100 mL via INTRAVENOUS

## 2020-07-04 NOTE — ED Notes (Addendum)
Attempted to place NG tube and patient refused stating she has had this done in the past and she knows she cannot swallow the tube. Patient says she always has this procedure done in "radiology" and is requesting it be placed there.

## 2020-07-04 NOTE — H&P (Signed)
History and Physical    [name] [id number] DOB: 1956/03/03 DOA: 07/04/2020  PCP: [name], MD  Patient coming from: Home  I have personally briefly reviewed patient's old medical records in [place]  Chief Complaint: Abdominal pain, vomiting, decreased ostomy output  HPI: [name] is a 65 y.o. female with medical history significant for perforated diverticulitis s/p sigmoid colectomy/ostomy in 2014, large ventral hernia, recurrent SBO, chronic diastolic CHF, atrial flutter s/p ablation not on anticoagulation, hypertension, Marfan syndrome, rheumatoid arthritis, blindness, hard of hearing who presents to the ED for evaluation of abdominal pain, vomiting, decreased ostomy output.    Patient states she lives at home alone and has home health aide/nursing come to the house.  She says one of the aides tested positive for COVID therefore she was tested and had a positive result approximately 6 days ago.  She did not have any symptoms and denied any fevers, chills, diaphoresis, dyspnea.  She has a chronic cough which he says was unchanged from her baseline.  Morning of 07/04/2020 she developed abdominal pain with nausea and vomiting.  She had reported decreased ostomy output.  She was concerned for recurrent bowel obstruction therefore came to the ED for further evaluation.  She says she has not had any further vomiting episodes while in the ED.  She is feeling dehydrated.  She says she normally ambulates with the use of a walker at home.  ED Course:  Initial vitals showed BP 107/84, pulse 83, RR 16, temp 100.0 F, SPO2 97% on room air.  Labs show sodium 139, potassium 3.7, bicarb 28, BUN 12, creatinine 1.32, serum glucose 116, LFTs within normal limits, lipase 29, WBC 7.1, hemoglobin 12.5, platelets 264,000.  SARS-CoV-2 PCR panel was collected and pending.  CT abdomen/pelvis with contrast shows a large fat and bowel containing ventral hernia with single mildly  dilated, gas and fluid-filled loop of small bowel extending into the hernia sac and transition to decompressed bowel in the mid abdomen.  Findings suspicious for partial SBO or bowel obstruction with closed-loop morphology.  No evidence of vascular compromise noted per radiology read.  Slight increase chronic dilation of the main pancreatic duct measuring up to 7 mm with unchanged tapering of the duct in the tail to 2 mm also noted.  Patient was given IV morphine 4 mg, IV Zofran 4 mg.  EDP consulted general surgery who requested NG tube placement and medical admission with formal consultation in a.m.  Hospitalist service was consulted to admit for further evaluation and management.  Review of Systems: All systems reviewed and are negative except as documented in history of present illness above.   Past Medical History:  Diagnosis Date  . Atrial flutter ([place])   . CHF (congestive heart failure) ([place])   . Chronic gout 2008  . Diverticulitis of large intestine with perforation   . Dysrhythmia   . Fibroid   . Hearing aid worn    pt wears bilateral hearing aids  . Hernia 4/08  . Hx of adenomatous polyp of colon 11/13/2016  . Hypertension   . Legally blind    since pt was a teenager  . Marfan's syndrome affecting skin    with scolosis  . Osteoporosis   . 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 Rheumatoid arthritis [facility])    sees Dr. Gavin Pound   . SBO (small bowel obstruction) ([place]) 01/05/2013  . Small bowel obstruction due to adhesions ([place])   . Status post  bunionectomy 1/10   bilateral   . Stroke ([place]) 2/08  . 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 ABLATION  01/04/14   atrial flutter ablation (2 circuits) by Dr Lovena Le  . ATRIAL FLUTTER ABLATION N/A 01/04/2014   Procedure: ATRIAL FLUTTER ABLATION;  Surgeon: Evans Lance, MD;  Location: [facility] CATH LAB;  Service:  Cardiovascular;  Laterality: N/A;  . BUNIONECTOMY Bilateral 1/10  . COLON SURGERY     colectomy  . COLOSTOMY  11/27/2012  . HERNIA REPAIR    . IR RADIOLOGIST EVAL & MGMT  10/01/2017  . IR RADIOLOGIST EVAL & MGMT  11/21/2017  . IR RADIOLOGIST EVAL & MGMT  02/13/2018  . IR RADIOLOGIST EVAL & MGMT  01/08/2019  . IR RADIOLOGIST EVAL & MGMT  12/29/2019  . LAPAROSCOPIC ABDOMINAL EXPLORATION N/A 01/06/2013   Procedure: LAPAROSCOPIC ABDOMINAL EXPLORATION;  Surgeon: Adin Hector, MD;  Location: [facility] ORS;  Service: General;  Laterality: N/A;  . LAPAROSCOPIC LYSIS OF ADHESIONS N/A 01/06/2013   Procedure: LAPAROSCOPIC LYSIS OF ADHESIONS/ INTEROTOMY REPAIR;  Surgeon: Adin Hector, MD;  Location: [facility] ORS;  Service: General;  Laterality: N/A;  . LAPAROTOMY N/A 11/27/2012   Procedure: EXPLORATORY LAPAROTOMY SIGMOID COLECTOMY, COLOSTOMY;  Surgeon: Madilyn Hook, DO;  Location: [facility] ORS;  Service: General;  Laterality: N/A;  . RADIOLOGY WITH ANESTHESIA Left 10/23/2017   Procedure: CT RENAL CRYO AND BIOPSY;  Surgeon: Aletta Edouard, MD;  Location: [facility] ORS;  Service: Radiology;  Laterality: Left;  . TOTAL KNEE ARTHROPLASTY Bilateral 6/08    Social History:  reports that she quit smoking about 7 years ago. Her smoking use included cigarettes. She smoked 1.00 pack per day. She has never used smokeless tobacco. She reports that she does not drink alcohol and does not use drugs.  Allergies  Allergen Reactions  . Spinach Itching, Rash and Other (See Comments)    Welts.    Family History  Problem Relation Age of Onset  . Heart attack Neg Hx   . Colon cancer Neg Hx   . Osteoporosis Neg Hx      Prior to Admission medications   Medication Sig Start Date End Date Taking? Authorizing Provider  Abatacept 125 MG/ML SOSY Inject 125 mg into the skin every Monday.    Yes [provider]  albuterol (VENTOLIN HFA) 108 (90 Base) MCG/ACT inhaler Inhale 1 puff into the lungs every 4 (four) hours as needed for wheezing or  shortness of breath. 10/19/19  Yes [name], MD  allopurinol (ZYLOPRIM) 100 MG tablet TAKE 1 TABLET BY MOUTH EVERY DAY Patient taking differently: Take 100 mg by mouth daily. 06/06/20  Yes [name], MD  amLODipine (NORVASC) 10 MG tablet TAKE 1 TABLET BY MOUTH EVERY DAY Patient taking differently: Take 10 mg by mouth daily. 06/06/20  Yes [name], MD  amoxicillin (AMOXIL) 500 MG capsule Take 1 capsule (500 mg total) by mouth 3 (three) times daily. 06/20/20   [name], MD  aspirin EC 81 MG tablet Take 1 tablet (81 mg total) by mouth daily. 05/11/14   Verlee Monte, MD  CALCIUM PO Take 1 tablet by mouth 2 (two) times daily.    [provider]  cetirizine (ZYRTEC) 10 MG tablet Take 1 tablet (10 mg total) by mouth daily. 11/17/14   [name], MD  Cholecalciferol (VITAMIN D) 2000 units CAPS Take 2,000 Units by mouth daily.    [provider]  CIPRODEX OTIC suspension INSTILL 4 DROPS UNTO EACH EAR TWICE DAILY AS NEEDED Patient taking differently: Place 4 drops into both ears 2 (two) times daily as needed (ear drum). Shake Well!  ** FOR THE EAR ONLY ** 06/19/18   [name], MD  denosumab (PROLIA) 60 MG/ML SOSY injection Inject 60 mg into the skin every 6 (six) months.    [provider]  diclofenac sodium (VOLTAREN) 1 % GEL Apply 1 application topically 4 (four) times daily as needed (arthritis).    [provider]  docusate sodium (COLACE) 100 MG capsule Take 100 mg by mouth 2 (two) times daily.    [provider]  esomeprazole (NEXIUM) 40 MG capsule Take 1 capsule (40 mg total) by mouth daily in the afternoon. 06/20/20   [name], MD  folic acid (FOLVITE) 233 MCG tablet Take 400 mcg by mouth daily.    [provider]  HYDROcodone-acetaminophen (NORCO) 10-325 MG tablet Take 1 tablet by mouth every 6 (six) hours as needed for moderate pain. 06/20/20 07/20/20  [name], MD  megestrol (MEGACE ES) 625 MG/5ML  suspension take 5 milliliters by mouth three times daily before meals as needed for appetite 05/17/20   [name], MD  Methotrexate Sodium (METHOTREXATE, PF,) 50 MG/2ML injection Inject 0.5 mLs into the muscle every Monday. 05/14/16   [provider]  Multiple Vitamin (MULTIVITAMIN WITH MINERALS) TABS tablet Take 1 tablet by mouth daily.    [provider]  PERIDEX 0.12 % solution Use as directed 15 mLs in the mouth or throat 2 (two) times daily.  03/23/20   [provider]  polyethylene glycol (MIRALAX / GLYCOLAX) 17 g packet Take 17 g by mouth daily as needed for mild constipation.    [provider]  tiZANidine (ZANAFLEX) 4 MG tablet TAKE 1 TABLET(4 MG) BY MOUTH TWICE DAILY 06/08/20   [name], MD  traMADol (ULTRAM) 50 MG tablet TAKE 1 TO 2 TABLETS BY MOUTH EVERY 6 HOURS AS NEEDED Patient taking differently: Take 50 mg by mouth in the morning and at bedtime. 02/11/20   [name], MD  vitamin B-12 (CYANOCOBALAMIN) 500 MCG tablet Take 500 mcg by mouth daily.    [provider]  vitamin C (ASCORBIC ACID) 500 MG tablet Take 500 mg by mouth daily.     [provider]    Physical Exam: Vitals:   07/04/20 1930 07/04/20 2030 07/04/20 2100 07/04/20 2130  BP: (!) 180/86 109/73 100/73 103/68  Pulse: 86 83 86 73  Resp: 17 14 14 16   Temp:      TempSrc:      SpO2: 100% 100% 100% 100%   Constitutional: Chronically ill-appearing woman resting supine in bed, NAD, calm, comfortable Eyes: Blind, opacified right cornea ENMT: Mucous membranes are dry.  Hard of hearing.  Hearing aid in place right ear. Neck: normal, supple, no masses. Respiratory: clear to auscultation bilaterally, no wheezing, no crackles. Normal respiratory effort. No accessory muscle use.  Cardiovascular: Regular rate and rhythm, no murmurs / rubs / gallops. No extremity edema. 2+ pedal pulses. Abdomen: Large lower abdominal ventral hernia, soft, nontender to light  palpation.  Colostomy in place left abdomen with scant output.  Bowel sounds present. Musculoskeletal: no clubbing / cyanosis.  Contractures of bilateral wrists and fingers on both hands.  Good ROM Normal muscle tone.  Skin: no rashes, lesions, ulcers. No induration Neurologic: CN 2-12 grossly intact. Sensation intact, Strength 5/5 in all 4.  Psychiatric: Normal judgment and insight. Alert and oriented x 3. Normal mood.   Labs on Admission: I have personally reviewed following labs and imaging studies  CBC: Recent Labs  Lab 07/04/20 1731  WBC 7.1  HGB 12.5  HCT 38.2  MCV 93.4  PLT [id number]   Basic Metabolic Panel: Recent Labs  Lab 07/04/20 1731  NA 139  K 3.7  CL 100  CO2 28  GLUCOSE 116*  BUN 12  CREATININE 1.32*  CALCIUM 9.6   GFR: CrCl cannot be calculated (Unknown ideal weight.). Liver Function Tests: Recent Labs  Lab 07/04/20 1731  AST 32  ALT 26  ALKPHOS 79  BILITOT 0.8  PROT 7.3  ALBUMIN 4.0   Recent Labs  Lab 07/04/20 1731  LIPASE 29   No results for input(s): AMMONIA in the last 168 hours. Coagulation Profile: No results for input(s): INR, PROTIME in the last 168 hours. Cardiac Enzymes: No results for input(s): CKTOTAL, CKMB, CKMBINDEX, TROPONINI in the last 168 hours. BNP (last 3 results) No results for input(s): PROBNP in the last 8760 hours. HbA1C: No results for input(s): HGBA1C in the last 72 hours. CBG: No results for input(s): GLUCAP in the last 168 hours. Lipid Profile: No results for input(s): CHOL, HDL, LDLCALC, TRIG, CHOLHDL, LDLDIRECT in the last 72 hours. Thyroid Function Tests: No results for input(s): TSH, T4TOTAL, FREET4, T3FREE, THYROIDAB in the last 72 hours. Anemia Panel: No results for input(s): VITAMINB12, FOLATE, FERRITIN, TIBC, IRON, RETICCTPCT in the last 72 hours. Urine analysis:    Component Value Date/Time   COLORURINE YELLOW 05/07/2020 0647   APPEARANCEUR HAZY (A) 05/07/2020 0647   LABSPEC 1.018 05/07/2020 0647    PHURINE 5.0 05/07/2020 0647   GLUCOSEU NEGATIVE 05/07/2020 0647   HGBUR NEGATIVE 05/07/2020 0647   BILIRUBINUR NEGATIVE 05/07/2020 0647   BILIRUBINUR neg 06/06/2015 1201   KETONESUR 5 (A) 05/07/2020 0647   PROTEINUR 30 (A) 05/07/2020 0647   UROBILINOGEN 0.2 06/06/2015 1201   UROBILINOGEN 0.2 11/22/2014 0929   NITRITE NEGATIVE 05/07/2020 0647   LEUKOCYTESUR NEGATIVE 05/07/2020 0647    Radiological Exams on Admission: CT Abdomen Pelvis W Contrast  Result Date: 07/04/2020 CLINICAL DATA:  Acute nonlocalized abdominal pain. EXAM: CT ABDOMEN AND PELVIS WITH CONTRAST TECHNIQUE: Multidetector CT imaging of the abdomen and pelvis was performed using the standard protocol following bolus administration of intravenous contrast. CONTRAST:  165mL OMNIPAQUE IOHEXOL 300 MG/ML  SOLN COMPARISON:  CT abdomen pelvis May 07, 2020 . FINDINGS: Lower chest: Bibasilar atelectasis for scarring. Hepatobiliary: Hepatic cysts. No suspicious hepatic lesions. Gallbladder is unremarkable. Similar mild dilation of common bile duct measuring up to 7 mm. Pancreas: Slightly increased chronic dilation of the pancreatic duct which measures 7 mm in the pancreatic head previously 6 mm with unchanged tapering of the duct in the tail to 2 mm. No pancreatic mass or inflammatory changes. Spleen: Normal in size without focal abnormality. Adrenals/Urinary Tract: Post treatment parenchymal scarring is again seen in the left kidney. Without findings to suggest tumor recurrence. Similar size of the indeterminate hypodense lesion in the interpolar region the right kidney which measures 9 mm (series 2, image 29). No hydronephrosis. Stomach/Bowel: The stomach is predominantly decompressed limiting evaluation the wall. Proximal and distal small bowel appear within normal limits without suspicious wall thickening or dilation. Large wide-mouth ventral hernia which contains nonobstructed colon and loops of small bowel as  well as a single loop of  mildly dilated small bowel which extends into the wide mouth ventral hernia with a transition to decompressed bowel in the mid abdomen (series 2, image 45) similar location to prior small-bowel obstruction, as well as another transition at the ostia of the hernia (series 2, image 55). The loop of small bowel this fluid and gas filled and demonstrates normal wall enhancement without suspicious thickening. Descending colostomy again visualized in the left lower quadrant. Similar mild colonic diverticulosis without evidence of diverticulitis. Vascular/Lymphatic: Aortic atherosclerosis. No enlarged abdominal or pelvic lymph nodes. Reproductive: Uterus and bilateral adnexa are unremarkable. Other: None Musculoskeletal: No suspicious bone lesions identified. Severe thoracolumbar rotatory levoscoliosis and degenerative changes as well as severe bilateral hip osteoarthritis. IMPRESSION: 1. Large fat and bowel containing wide-mouth ventral hernia with a single mildly dilated, gas and fluid-filled loop of small bowel extending into the hernia sac and a transition to decompressed bowel in the mid abdomen which is in a similar location to the prior small bowel obstruction as well as a second focal transition to decompressed bowel at the ostia of the hernia. However, this loop of small bowel demonstrate normal wall thickness and enhancement, additionally there is a normal distal bowel gas pattern without upstream dilation. Findings which are suspicious for bowel obstruction with closed loop morphology. However, given the relative normal appearance of proximal and distal bowel as well as the normal enhancement there is not appear to be evidence of vascular compromise at this time. 2. Slightly increased chronic dilation of the main pancreatic duct measuring up to 7 mm with unchanged tapering of the duct in the tail to 2 mm, which was previously evaluated on MRI without discrete obstructive pathology visualized. Further evaluation  with ERCP could be provided, if not previously performed. 3. Aortic atherosclerosis. Aortic Atherosclerosis (ICD10-I70.0). The findings of bowel obstruction with potential closed loop morphology were called by telephone at the time of interpretation on 07/04/2020 at 9:42 pm to provider [facility] , who verbally acknowledged these results. Electronically Signed   By: Dahlia Bailiff MD   On: 07/04/2020 21:48    EKG: Not performed.  Assessment/Plan Principal Problem:   Partial small bowel obstruction (HCC) Active Problems:   Essential hypertension   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 11/28/2012   Atrial flutter (HCC)   Chronic diastolic CHF (congestive heart failure) ([place])   COVID-19 virus infection  [name] is a 65 y.o. female with medical history significant for perforated diverticulitis s/p sigmoid colectomy/ostomy in 2014, large ventral hernia, recurrent SBO, chronic diastolic CHF, atrial flutter s/p ablation not on anticoagulation, hypertension, Marfan syndrome, rheumatoid arthritis, blindness, hard of hearing who is admitted with partial SBO.  Partial SBO versus closed-loop bowel obstruction Chronic large ventral hernia, recurrent SBO Hx diverticulitis with perforation s/p sigmoid colectomy/ostomy 2014: CT imaging suggestive of partial SBO versus closed-loop bowel obstruction.  General surgery consulted and will see in AM.  NG tube placement attempted in the ED, however patient refused stating that she normally has it placed via radiology.  She has not had any emesis since arriving to the ED therefore will hold NG tube placement at this time. -Keep n.p.o. -Continue IV fluid hydration overnight -IV morphine as needed for pain control -General surgery to see in a.m.  COVID-19 viral infection: Patient reports positive COVID-19 test at home after exposure from home health aide.  She is asymptomatic from the standpoint.  No directed therapy against  COVID-19 indicated at this  time.  Continue supportive care.  SARS-CoV-2 PCR here is pending.  AKI: Mild, secondary to dehydration from GI losses.  Continue IV fluids overnight and repeat labs in AM.  Chronic diastolic CHF: Chronic and stable.  Appears slightly hypovolemic on admission.  Monitor strict I/O's.  Hypertension: BP on the soft side.  Hold home amlodipine while NPO.  History of atrial flutter s/p ablation 2015: In sinus rhythm.  Not on anticoagulation or requiring rate/rhythm controlling meds as an outpatient.  Rheumatoid arthritis: On methotrexate and abatacept as an outpatient.  Marfan syndrome/blindness/hard of hearing  DVT prophylaxis: Lovenox Code Status: Full code, confirmed with patient Family Communication: Discussed with patient, she has discussed with family Disposition Plan: From home and likely discharge to home pending resolution of SBO, general surgery evaluation and recommendations. Consults called: General surgery Admission status:  Status is: Inpatient  Remains inpatient appropriate because:Ongoing active pain requiring inpatient pain management and Inpatient level of care appropriate due to severity of illness   Dispo: The patient is from: Home              Anticipated d/c is to: Home              Anticipated d/c date is: 2 days              Patient currently is not medically stable to d/c.   Zada Finders MD [facility]  If 7PM-7AM, please contact night-coverage [email or web address]  07/04/2020, 10:20 PM

## 2020-07-04 NOTE — ED Notes (Signed)
Date and time results received: 07/04/20 11:36 PM (use smartphrase ".now" to insert current time)  Test: Covid-19 Critical Value: Positive  Name of Provider Notified: Posey Pronto   Orders Received? Or Actions Taken?: Actions Taken: Already known covid-19 positive

## 2020-07-04 NOTE — ED Triage Notes (Signed)
Pt presents from home with c/o Covid positive. Pt has had vomiting and constipation for approx one day. Pt also has a colostomy bag in place. Pt also has an abdominal hernia. Pt tested positive for Covid approx 6 days, asymptomatic otherwise for covid at this time.

## 2020-07-04 NOTE — ED Notes (Signed)
Pt cleaned, peri care performed, and Purewick placed.

## 2020-07-04 NOTE — ED Provider Notes (Signed)
[facility] DEPT Provider Note   CSN: [id number] Arrival date & time: 07/04/20  1352     History Chief Complaint  Patient presents with  . Emesis  . Covid Positive    [name] is a 65 y.o. female past medical History of atrial flutter, CHF, diverticulitis, hypertension, colostomy, small bowel obstruction who was recently tested positive for COVID 6 days ago who presents for evaluation of abdominal pain, vomiting, constipation that began yesterday.  She started noticing that her ostomy was not putting out as much.  Then she started having abdominal pain.  She states that since last night, she has had persistent vomiting has not been able to keep anything down.  She states this feels like when she has had a small bowel obstruction before.  She has an abdominal hernia but states it is not really causing her a lot of pain.  She feels like her stomach hurts more to the side of the hernia.  She recently tested positive for COVID about 6 days ago.  She has not been vaccinated.  She does not have any fevers, difficulty breathing, chest pain.  The history is provided by the patient.       Past Medical History:  Diagnosis Date  . Atrial flutter ([place])   . CHF (congestive heart failure) ([place])   . Chronic gout 2008  . Diverticulitis of large intestine with perforation   . Dysrhythmia   . Fibroid   . Hearing aid worn    pt wears bilateral hearing aids  . Hernia 4/08  . Hx of adenomatous polyp of colon 11/13/2016  . Hypertension   . Legally blind    since pt was a teenager  . Marfan's syndrome affecting skin    with scolosis  . Osteoporosis   . 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 Rheumatoid arthritis [facility])    sees Dr. Gavin Pound   . SBO (small bowel obstruction) ([place]) 01/05/2013  . Small bowel obstruction due to adhesions ([place])   . Status post bunionectomy 1/10   bilateral   . Stroke ([place]) 2/08  . Substance abuse ([place])    recovering alcoholic  [id number]   . 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 Partial small bowel obstruction ([place]) 07/04/2020  . COVID-19 virus infection 07/04/2020  . Malnutrition of moderate degree 05/11/2020  . SBO (small bowel obstruction) ([place]) 05/07/2020  . Left renal mass 10/23/2017  . Hx of adenomatous polyp of colon 11/13/2016  . 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 Chronic diastolic CHF (congestive heart failure) ([place])   . Ventral hernia without obstruction or gangrene 06/06/2015  . Renal mass   . Pulmonary HTN ([place]) 01/01/2014  . Iron deficiency anemia 01/01/2014  . Atrial flutter ([place]) 12/31/2013  . Encounter for ostomy care education 11/30/2013  . Stoma dermatitis 11/30/2013  . Incisional hernia 11/30/2013  . Constipation, chronic 11/30/2013  . Charcot's joint of foot 07/07/2013  . Bilateral leg edema 04/20/2013  . 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 Rheumatoid arthritis ([place]) 06/07/2009  . THYROID NODULE 07/01/2007  . LUNG NODULE 07/01/2007  . Osteoporosis 05/07/2007  . 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 Blindness 04/12/2007  . HEMORRHOIDS, INTERNAL 04/12/2007  . 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 Hyperlipidemia 01/07/2007  . Essential hypertension 01/07/2007  . GERD 01/07/2007  . Marfan's syndrome 01/07/2007  .  Stroke ([place]) 07/2006  . Chronic gout 2008    Past Surgical History:  Procedure Laterality Date  . ABLATION  01/04/14   atrial flutter ablation (2 circuits) by Dr Lovena Le  . ATRIAL FLUTTER ABLATION N/A 01/04/2014   Procedure: ATRIAL FLUTTER ABLATION;  Surgeon: Evans Lance, MD;  Location: [facility] C[facility] LAB;  Service: Cardiovascular;  Laterality: N/A;  . BUNIONECTOMY Bilateral 1/10  . COLON SURGERY     colectomy  . COLOSTOMY  11/27/2012  . HERNIA REPAIR    . IR RADIOLOGIST EVAL & MGMT  10/01/2017  . IR RADIOLOGIST EVAL & MGMT  11/21/2017  .  IR RADIOLOGIST EVAL & MGMT  02/13/2018  . IR RADIOLOGIST EVAL & MGMT  01/08/2019  . IR RADIOLOGIST EVAL & MGMT  12/29/2019  . LAPAROSCOPIC ABDOMINAL EXPLORATION N/A 01/06/2013   Procedure: LAPAROSCOPIC ABDOMINAL EXPLORATION;  Surgeon: Adin Hector, MD;  Location: [facility] ORS;  Service: General;  Laterality: N/A;  . LAPAROSCOPIC LYSIS OF ADHESIONS N/A 01/06/2013   Procedure: LAPAROSCOPIC LYSIS OF ADHESIONS/ INTEROTOMY REPAIR;  Surgeon: Adin Hector, MD;  Location: [facility] ORS;  Service: General;  Laterality: N/A;  . LAPAROTOMY N/A 11/27/2012   Procedure: EXPLORATORY LAPAROTOMY SIGMOID COLECTOMY, COLOSTOMY;  Surgeon: Madilyn Hook, DO;  Location: [facility] ORS;  Service: General;  Laterality: N/A;  . RADIOLOGY WITH ANESTHESIA Left 10/23/2017   Procedure: CT RENAL CRYO AND BIOPSY;  Surgeon: Aletta Edouard, MD;  Location: [facility] ORS;  Service: Radiology;  Laterality: Left;  . TOTAL KNEE ARTHROPLASTY Bilateral 6/08     OB History    Gravida  1   Para  1   Term  1   Preterm      AB      Living  1     SAB      IAB      Ectopic      Multiple      Live Births  1           Family History  Problem Relation Age of Onset  . Heart attack Neg Hx   . Colon cancer Neg Hx   . Osteoporosis Neg Hx     Social History   Tobacco Use  . Smoking status: Former Smoker    Packs/day: 1.00    Types: Cigarettes    Quit date: 11/27/2012    Years since quitting: 7.6  . Smokeless tobacco: Never Used  Vaping Use  . Vaping Use: Former  Substance Use Topics  . Alcohol use: No    Alcohol/week: 0.0 standard drinks    Comment: recovering alcoholic sober since 0000000  . Drug use: No    Home Medications Prior to Admission medications   Medication Sig Start Date End Date Taking? Authorizing Provider  Abatacept 125 MG/ML SOSY Inject 125 mg into the skin every Monday.    Yes [provider]  albuterol (VENTOLIN HFA) 108 (90 Base) MCG/ACT inhaler Inhale 1 puff into the lungs every 4 (four) hours as needed for  wheezing or shortness of breath. 10/19/19  Yes [name], MD  allopurinol (ZYLOPRIM) 100 MG tablet TAKE 1 TABLET BY MOUTH EVERY DAY Patient taking differently: Take 100 mg by mouth daily. 06/06/20  Yes [name], MD  amLODipine (NORVASC) 10 MG tablet TAKE 1 TABLET BY MOUTH EVERY DAY Patient taking differently: Take 10 mg by mouth daily. 06/06/20  Yes [name], MD  amoxicillin (AMOXIL) 500 MG capsule Take 1 capsule (500 mg total) by mouth 3 (  three) times daily. 06/20/20   [name], MD  aspirin EC 81 MG tablet Take 1 tablet (81 mg total) by mouth daily. 05/11/14   Verlee Monte, MD  CALCIUM PO Take 1 tablet by mouth 2 (two) times daily.    [provider]  cetirizine (ZYRTEC) 10 MG tablet Take 1 tablet (10 mg total) by mouth daily. 11/17/14   [name], MD  Cholecalciferol (VITAMIN D) 2000 units CAPS Take 2,000 Units by mouth daily.    [provider]  CIPRODEX OTIC suspension INSTILL 4 DROPS UNTO EACH EAR TWICE DAILY AS NEEDED Patient taking differently: Place 4 drops into both ears 2 (two) times daily as needed (ear drum). Shake Well!  ** FOR THE EAR ONLY ** 06/19/18   [name], MD  denosumab (PROLIA) 60 MG/ML SOSY injection Inject 60 mg into the skin every 6 (six) months.    [provider]  diclofenac sodium (VOLTAREN) 1 % GEL Apply 1 application topically 4 (four) times daily as needed (arthritis).    [provider]  docusate sodium (COLACE) 100 MG capsule Take 100 mg by mouth 2 (two) times daily.    [provider]  esomeprazole (NEXIUM) 40 MG capsule Take 1 capsule (40 mg total) by mouth daily in the afternoon. 06/20/20   [name], MD  folic acid (FOLVITE) 196 MCG tablet Take 400 mcg by mouth daily.    [provider]  HYDROcodone-acetaminophen (NORCO) 10-325 MG tablet Take 1 tablet by mouth every 6 (six) hours as needed for moderate pain. 06/20/20 07/20/20  [name], MD  megestrol (MEGACE ES) 625  MG/5ML suspension take 5 milliliters by mouth three times daily before meals as needed for appetite 05/17/20   [name], MD  Methotrexate Sodium (METHOTREXATE, PF,) 50 MG/2ML injection Inject 0.5 mLs into the muscle every Monday. 05/14/16   [provider]  Multiple Vitamin (MULTIVITAMIN WITH MINERALS) TABS tablet Take 1 tablet by mouth daily.    [provider]  PERIDEX 0.12 % solution Use as directed 15 mLs in the mouth or throat 2 (two) times daily.  03/23/20   [provider]  polyethylene glycol (MIRALAX / GLYCOLAX) 17 g packet Take 17 g by mouth daily as needed for mild constipation.    [provider]  tiZANidine (ZANAFLEX) 4 MG tablet TAKE 1 TABLET(4 MG) BY MOUTH TWICE DAILY 06/08/20   [name], MD  traMADol (ULTRAM) 50 MG tablet TAKE 1 TO 2 TABLETS BY MOUTH EVERY 6 HOURS AS NEEDED Patient taking differently: Take 50 mg by mouth in the morning and at bedtime. 02/11/20   [name], MD  vitamin B-12 (CYANOCOBALAMIN) 500 MCG tablet Take 500 mcg by mouth daily.    [provider]  vitamin C (ASCORBIC ACID) 500 MG tablet Take 500 mg by mouth daily.     [provider]    Allergies    Spinach  Review of Systems   Review of Systems  Constitutional: Negative for fever.  Respiratory: Negative for cough and shortness of breath.   Cardiovascular: Negative for chest pain.  Gastrointestinal: Positive for abdominal pain, constipation, nausea and vomiting.  Genitourinary: Negative for dysuria and hematuria.  Neurological: Negative for headaches.  All other systems reviewed and are negative.   Physical Exam Updated Vital Signs BP 103/68   Pulse 73   Temp 100 F (37.8 C) (Oral)   Resp 16   LMP 06/25/2006 (LMP Unknown)  SpO2 100%   Physical Exam Vitals and nursing note reviewed.  Constitutional:      Appearance: Normal appearance. She is well-developed and well-nourished.  HENT:     Head: Normocephalic and  atraumatic.     Mouth/Throat:     Mouth: Oropharynx is clear and moist and mucous membranes are normal.  Eyes:     General: Lids are normal.     Extraocular Movements: EOM normal.     Conjunctiva/sclera: Conjunctivae normal.     Pupils: Pupils are equal, round, and reactive to light.  Cardiovascular:     Rate and Rhythm: Normal rate and regular rhythm.     Pulses: Normal pulses.     Heart sounds: Normal heart sounds. No murmur heard. No friction rub. No gallop.   Pulmonary:     Effort: Pulmonary effort is normal.     Breath sounds: Normal breath sounds.     Comments: Lungs clear to auscultation bilaterally.  Symmetric chest rise.  No wheezing, rales, rhonchi. Abdominal:     Palpations: Abdomen is soft. Abdomen is not rigid.     Tenderness: There is generalized abdominal tenderness. There is no guarding.     Hernia: A hernia is present. Hernia is present in the ventral area.     Comments: Large ventral hernia that is soft, it is not warm to touch, erythematous.  Unable to reduce.  Patient with generalized abdominal tenderness.  Ostomy bag present on left mid abdomen.  There is a small amount of output noted. No surrounding warmth, erythema.   Musculoskeletal:        General: Normal range of motion.     Cervical back: Full passive range of motion without pain.  Skin:    General: Skin is warm and dry.     Capillary Refill: Capillary refill takes less than 2 seconds.  Neurological:     Mental Status: She is alert and oriented to person, place, and time.  Psychiatric:        Mood and Affect: Mood and affect normal.        Speech: Speech normal.     ED Results / Procedures / Treatments   Labs (all labs ordered are listed, but only abnormal results are displayed) Labs Reviewed  COMPREHENSIVE METABOLIC PANEL - Abnormal; Notable for the following components:      Result Value   Glucose, Bld 116 (*)    Creatinine, Ser 1.32 (*)    GFR, Estimated 45 (*)    All other components within  normal limits  RESP PANEL BY RT-PCR (FLU A&B, COVID) ARPGX2  LIPASE, BLOOD  CBC  URINALYSIS, ROUTINE W REFLEX MICROSCOPIC    EKG None  Radiology CT Abdomen Pelvis W Contrast  Result Date: 07/04/2020 CLINICAL DATA:  Acute nonlocalized abdominal pain. EXAM: CT ABDOMEN AND PELVIS WITH CONTRAST TECHNIQUE: Multidetector CT imaging of the abdomen and pelvis was performed using the standard protocol following bolus administration of intravenous contrast. CONTRAST:  131mL OMNIPAQUE IOHEXOL 300 MG/ML  SOLN COMPARISON:  CT abdomen pelvis May 07, 2020 . FINDINGS: Lower chest: Bibasilar atelectasis for scarring. Hepatobiliary: Hepatic cysts. No suspicious hepatic lesions. Gallbladder is unremarkable. Similar mild dilation of common bile duct measuring up to 7 mm. Pancreas: Slightly increased chronic dilation of the pancreatic duct which measures 7 mm in the pancreatic head previously 6 mm with unchanged tapering of the duct in the tail to 2 mm. No pancreatic mass or inflammatory changes. Spleen: Normal in size without focal abnormality. Adrenals/Urinary Tract:  Post treatment parenchymal scarring is again seen in the left kidney. Without findings to suggest tumor recurrence. Similar size of the indeterminate hypodense lesion in the interpolar region the right kidney which measures 9 mm (series 2, image 29). No hydronephrosis. Stomach/Bowel: The stomach is predominantly decompressed limiting evaluation the wall. Proximal and distal small bowel appear within normal limits without suspicious wall thickening or dilation. Large wide-mouth ventral hernia which contains nonobstructed colon and loops of small bowel as well as a single loop of mildly dilated small bowel which extends into the wide mouth ventral hernia with a transition to decompressed bowel in the mid abdomen (series 2, image 45) similar location to prior small-bowel obstruction, as well as another transition at the ostia of the hernia (series 2, image  55). The loop of small bowel this fluid and gas filled and demonstrates normal wall enhancement without suspicious thickening. Descending colostomy again visualized in the left lower quadrant. Similar mild colonic diverticulosis without evidence of diverticulitis. Vascular/Lymphatic: Aortic atherosclerosis. No enlarged abdominal or pelvic lymph nodes. Reproductive: Uterus and bilateral adnexa are unremarkable. Other: None Musculoskeletal: No suspicious bone lesions identified. Severe thoracolumbar rotatory levoscoliosis and degenerative changes as well as severe bilateral hip osteoarthritis. IMPRESSION: 1. Large fat and bowel containing wide-mouth ventral hernia with a single mildly dilated, gas and fluid-filled loop of small bowel extending into the hernia sac and a transition to decompressed bowel in the mid abdomen which is in a similar location to the prior small bowel obstruction as well as a second focal transition to decompressed bowel at the ostia of the hernia. However, this loop of small bowel demonstrate normal wall thickness and enhancement, additionally there is a normal distal bowel gas pattern without upstream dilation. Findings which are suspicious for bowel obstruction with closed loop morphology. However, given the relative normal appearance of proximal and distal bowel as well as the normal enhancement there is not appear to be evidence of vascular compromise at this time. 2. Slightly increased chronic dilation of the main pancreatic duct measuring up to 7 mm with unchanged tapering of the duct in the tail to 2 mm, which was previously evaluated on MRI without discrete obstructive pathology visualized. Further evaluation with ERCP could be provided, if not previously performed. 3. Aortic atherosclerosis. Aortic Atherosclerosis (ICD10-I70.0). The findings of bowel obstruction with potential closed loop morphology were called by telephone at the time of interpretation on 07/04/2020 at 9:42 pm to  provider [facility] , who verbally acknowledged these results. Electronically Signed   By: Dahlia Bailiff MD   On: 07/04/2020 21:48    Procedures Procedures (including critical care time)  Medications Ordered in ED Medications  morphine 4 MG/ML injection 4 mg (4 mg Intravenous Given 07/04/20 2010)  ondansetron (ZOFRAN) injection 4 mg (4 mg Intravenous Given 07/04/20 2010)  iohexol (OMNIPAQUE) 300 MG/ML solution 100 mL (100 mLs Intravenous Contrast Given 07/04/20 2037)    ED Course  I have reviewed the triage vital signs and the nursing notes.  Pertinent labs & imaging results that were available during my care of the patient were reviewed by me and considered in my medical decision making (see chart for details).    MDM Rules/Calculators/A&P                          65 year old female who presents for evaluation of abdominal pain, nausea/vomiting, constipation.  She reports that she is concerned about possible small bowel obstruction.  Also reports  that she tested positive for COVID 6 days ago.  She reports that last night, she started having abdominal pain, vomiting has not been able to keep anything down.  States symptoms feel similar to previous small bowel obstruction.  On initial arrival, she is afebrile, nontoxic-appearing.  Vital signs are stable.  On exam, she has a large ventral hernia that is soft, not warm, erythematous.  She does have some generalized abdominal tenderness.  Ostomy bag is in the left mid abdomen with minimal output.  Concern for small bowel obstruction given history/physical exam.  Plan check labs, CT.  CMP shows normal BUN. Cr is 1.32. Lipase is normal. CBC shows no leukocytosis, hgb is 12.5.   CT abd/pelvis shows large fat and bowel containing ventral hernia with a single mildly dilated gas and fluid-filled loop of small bowel extending into the hernia sac.  There is a transition to decompressed bowel in the mid abdomen which is similar in location to previous  abdominal obstruction.  He states that it could be a partial bowel obstruction versus full bowel obstruction.  He does not see any vascular compromise.  Discussed patient with Dr. Harlow Asa (Gen Surgery) who reviewed patient CT on pelvis.  He feels at this time, but this is not concerning for vascular compromise.  He agrees with plan for admission.  He recommends NG tube, medical admission with plans for surgery to consult.  Discussed patient with Dr. Posey Pronto (hospitalist) who accepts patient for admission.   [name] was evaluated in Emergency Department on 07/04/2020 for the symptoms described in the history of present illness. She was evaluated in the context of the global COVID-19 pandemic, which necessitated consideration that the patient might be at risk for infection with the SARS-CoV-2 virus that causes COVID-19. Institutional protocols and algorithms that pertain to the evaluation of patients at risk for COVID-19 are in a state of rapid change based on information released by regulatory bodies including the CDC and federal and state organizations. These policies and algorithms were followed during the patient's care in the ED.  Portions of this note were generated with [employer]. Dictation errors may occur despite best attempts at proofreading.   Final Clinical Impression(s) / ED Diagnoses Final diagnoses:  COVID-19  Partial small bowel obstruction [facility])    Rx / DC Orders ED Discharge Orders    None       Desma Mcgregor 07/04/20 [place], MD 07/04/20 [id number]

## 2020-07-05 ENCOUNTER — Inpatient Hospital Stay (HOSPITAL_COMMUNITY): Payer: Medicare Other

## 2020-07-05 ENCOUNTER — Other Ambulatory Visit: Payer: Self-pay

## 2020-07-05 DIAGNOSIS — K56600 Partial intestinal obstruction, unspecified as to cause: Secondary | ICD-10-CM | POA: Diagnosis not present

## 2020-07-05 DIAGNOSIS — N179 Acute kidney failure, unspecified: Secondary | ICD-10-CM | POA: Diagnosis not present

## 2020-07-05 LAB — CBC WITH DIFFERENTIAL/PLATELET
Abs Immature Granulocytes: 0.01 10*3/uL (ref 0.00–0.07)
Basophils Absolute: 0 10*3/uL (ref 0.0–0.1)
Basophils Relative: 0 %
Eosinophils Absolute: 0 10*3/uL (ref 0.0–0.5)
Eosinophils Relative: 1 %
HCT: 36.4 % (ref 36.0–46.0)
Hemoglobin: 11.6 g/dL — ABNORMAL LOW (ref 12.0–15.0)
Immature Granulocytes: 0 %
Lymphocytes Relative: 28 %
Lymphs Abs: 1.4 10*3/uL (ref 0.7–4.0)
MCH: 31.1 pg (ref 26.0–34.0)
MCHC: 31.9 g/dL (ref 30.0–36.0)
MCV: 97.6 fL (ref 80.0–100.0)
Monocytes Absolute: 0.7 10*3/uL (ref 0.1–1.0)
Monocytes Relative: 14 %
Neutro Abs: 2.9 10*3/uL (ref 1.7–7.7)
Neutrophils Relative %: 57 %
Platelets: 221 10*3/uL (ref 150–400)
RBC: 3.73 MIL/uL — ABNORMAL LOW (ref 3.87–5.11)
RDW: 14.6 % (ref 11.5–15.5)
WBC: 5.1 10*3/uL (ref 4.0–10.5)
nRBC: 0 % (ref 0.0–0.2)

## 2020-07-05 LAB — COMPREHENSIVE METABOLIC PANEL
ALT: 23 U/L (ref 0–44)
AST: 28 U/L (ref 15–41)
Albumin: 3.5 g/dL (ref 3.5–5.0)
Alkaline Phosphatase: 68 U/L (ref 38–126)
Anion gap: 9 (ref 5–15)
BUN: 14 mg/dL (ref 8–23)
CO2: 29 mmol/L (ref 22–32)
Calcium: 8.4 mg/dL — ABNORMAL LOW (ref 8.9–10.3)
Chloride: 103 mmol/L (ref 98–111)
Creatinine, Ser: 1.13 mg/dL — ABNORMAL HIGH (ref 0.44–1.00)
GFR, Estimated: 54 mL/min — ABNORMAL LOW (ref >60)
Glucose, Bld: 96 mg/dL (ref 70–99)
Potassium: 3.7 mmol/L (ref 3.5–5.1)
Sodium: 141 mmol/L (ref 135–145)
Total Bilirubin: 0.6 mg/dL (ref 0.3–1.2)
Total Protein: 6.5 g/dL (ref 6.5–8.1)

## 2020-07-05 LAB — MAGNESIUM: Magnesium: 1.8 mg/dL (ref 1.7–2.4)

## 2020-07-05 LAB — PHOSPHORUS: Phosphorus: 5.2 mg/dL — ABNORMAL HIGH (ref 2.5–4.6)

## 2020-07-05 MED ORDER — LIP MEDEX EX OINT
TOPICAL_OINTMENT | CUTANEOUS | Status: AC
  Filled 2020-07-05: qty 7

## 2020-07-05 MED ORDER — DIATRIZOATE MEGLUMINE & SODIUM 66-10 % PO SOLN
90.0000 mL | Freq: Once | ORAL | Status: AC
  Administered 2020-07-05: 90 mL via ORAL
  Filled 2020-07-05: qty 90

## 2020-07-05 MED ORDER — SODIUM CHLORIDE 0.9 % IV SOLN: INTRAVENOUS | Status: AC

## 2020-07-05 NOTE — Progress Notes (Signed)
Pt stable at this time with no needs. Pt assessment on arrival to floor performed. Pt is blind and weak with mobility at baseline. Pt also has a colostomy and large hernia. Pt is requesting to at least drink something. Pt is having large liquid stools through her colostomy with normal bowel sounds. Rn paged midlevel provider to ask about sips/ chips. Rn awaiting call back.

## 2020-07-05 NOTE — Progress Notes (Addendum)
[facility]  PROGRESS NOTE  [name] OVF:[id number] DOB: 03/01/1956 DOA: 07/04/2020 PCP: [name], MD   Brief HPI:   65 -year-old female With medical history of perforated diverticulitis status post sigmoid colectomy/ostomy in 2014, large ventral hernia, recurrent SBO, chronic diastolic CHF, atrial flutter status post ablation not on anticoagulation, hypertension, Marfan syndrome, rheumatoid arthritis, blindness, hard of hearing came to ED for evaluation of abdominal pain, vomiting and decreased ostomy output.  Patient says one of her aides tested positive for COVID so she had positive result approximately 6 days ago.  She did not have any symptoms of shortness of breath, fever or chills. She developed abdominal pain with nausea vomiting and came to ED.  General surgery was consulted.    Subjective   Patient seen and examined, denies abdominal pain at this time.   Assessment/Plan:     1. Partial SBO/ventral hernia-management per surgery.  As per surgery she has most tolerated ostomy and having less pain.  Hopefully this will improve without need for surgery. 2. COVID-19 infection-patient reports positive COVID-19 test at home from exposure to home health aide.  She is asymptomatic at this time.  Will not treat for COVID-19 infection  3. Acute kidney injury-mild, secondary to dehydration from GI losses.  Patient started on IV fluids.  Creatinine is 1.13 at this time. 4. Hypertension-amlodipine is currently on hold. 5. Rheumatoid arthritis-patient on methotrexate and abatacept as outpatient 6. History of atrial flutter s/p ablation 2015:- In sinus rhythm.  Not on anticoagulation or requiring rate/rhythm controlling meds as an outpatient.       COVID-19 Labs  No results for input(s): DDIMER, FERRITIN, LDH, CRP in the last 72 hours.  Lab Results  Component Value Date   SARSCOV2NAA POSITIVE (A) 07/04/2020   [place] NEGATIVE 05/07/2020     Scheduled  medications:   . enoxaparin (LOVENOX) injection  40 mg Subcutaneous Q24H         CBG: No results for input(s): GLUCAP in the last 168 hours.  SpO2: 100 %    CBC: Recent Labs  Lab 07/04/20 1731 07/05/20 0400  WBC 7.1 5.1  NEUTROABS  --  2.9  HGB 12.5 11.6*  HCT 38.2 36.4  MCV 93.4 97.6  PLT 264 841    Basic Metabolic Panel: Recent Labs  Lab 07/04/20 1731 07/05/20 0400  NA 139 141  K 3.7 3.7  CL 100 103  CO2 28 29  GLUCOSE 116* 96  BUN 12 14  CREATININE 1.32* 1.13*  CALCIUM 9.6 8.4*  MG  --  1.8  PHOS  --  5.2*     Liver Function Tests: Recent Labs  Lab 07/04/20 1731 07/05/20 0400  AST 32 28  ALT 26 23  ALKPHOS 79 68  BILITOT 0.8 0.6  PROT 7.3 6.5  ALBUMIN 4.0 3.5     Antibiotics: Anti-infectives (From admission, onward)   None       DVT prophylaxis: Lovenox  Code Status: Full code  Family Communication: No family at bedside   Consultants:    Procedures:      Objective   Vitals:   07/05/20 0630 07/05/20 0940 07/05/20 1130 07/05/20 1430  BP: 133/74 115/73 126/74 131/71  Pulse: 98 76 86 86  Resp: 17 14 16 15   Temp:      TempSrc:      SpO2: 100% 100% 100% 100%   No intake or output data in the 24 hours ending 07/05/20 1620  No intake/output data recorded.  There were no vitals filed for this visit.  Physical Examination:    General-appears in no acute distress  Heart-S1-S2, regular, no murmur auscultated  Lungs-clear to auscultation bilaterally, no wheezing or crackles auscultated  Abdomen-soft, nontender, no organomegaly, colostomy bag in place  Extremities-no edema in the lower extremities  Neuro-alert, oriented x3, no focal deficit noted   Status is: Inpatient  Dispo: The patient is from: Home              Anticipated d/c is to: Home              Anticipated d/c date is: 07/09/2020              Patient currently not stable for discharge  Barrier to discharge-partial SBO            Data  Reviewed:   Recent Results (from the past 240 hour(s))  Resp Panel by RT-PCR (Flu A&B, Covid) Nasopharyngeal Swab     Status: Abnormal   Collection Time: 07/04/20  7:47 PM   Specimen: Nasopharyngeal Swab; Nasopharyngeal(NP) swabs in vial transport medium  Result Value Ref Range Status   SARS Coronavirus 2 by RT PCR POSITIVE (A) NEGATIVE Final    Comment: LACIVITA H. 01.10.21 @ 2307 BY MECIAL J. RESULT CALLED TO, READ BACK BY AND VERIFIED WITH: (NOTE) SARS-CoV-2 target nucleic acids are DETECTED.  The SARS-CoV-2 RNA is generally detectable in upper respiratory specimens during the acute phase of infection. Positive results are indicative of the presence of the identified virus, but do not rule out bacterial infection or co-infection with other pathogens not detected by the test. Clinical correlation with patient history and other diagnostic information is necessary to determine patient infection status. The expected result is Negative.  Fact Sheet for Patients: EntrepreneurPulse.com.au  Fact Sheet for Healthcare Providers: IncredibleEmployment.be  This test is not yet approved or cleared by the [place] FDA and  has been authorized for detection and/or diagnosis of SARS-CoV-2 by FDA under an Emergency Use Authorization (EUA).  This EUA will remain in effect (meaning this test  can be used) for the duration of  the COVID-19 declaration under Section 564(b)(1) of the Act, 21 U.S.C. section 360bbb-3(b)(1), unless the authorization is terminated or revoked sooner.     Influenza A by PCR NEGATIVE NEGATIVE Final   Influenza B by PCR NEGATIVE NEGATIVE Final    Comment: (NOTE) The Xpert Xpress SARS-CoV-2/FLU/RSV plus assay is intended as an aid in the diagnosis of influenza from Nasopharyngeal swab specimens and should not be used as a sole basis for treatment. Nasal washings and aspirates are unacceptable for Xpert Xpress  SARS-CoV-2/FLU/RSV testing.  Fact Sheet for Patients: EntrepreneurPulse.com.au  Fact Sheet for Healthcare Providers: IncredibleEmployment.be  This test is not yet approved or cleared by the [place] FDA and has been authorized for detection and/or diagnosis of SARS-CoV-2 by FDA under an Emergency Use Authorization (EUA). This EUA will remain in effect (meaning this test can be used) for the duration of the COVID-19 declaration under Section 564(b)(1) of the Act, 21 U.S.C. section 360bbb-3(b)(1), unless the authorization is terminated or revoked.  Performed at [facility], [address] [place], [place] [address]     Recent Labs  Lab 07/04/20 1731  LIPASE 29   No results for input(s): AMMONIA in the last 168 hours.  Cardiac Enzymes: No results for input(s): CKTOTAL, CKMB, CKMBINDEX, TROPONINI in the last 168 hours. BNP (last 3 results) No results for input(s): BNP  in the last 8760 hours.  ProBNP (last 3 results) No results for input(s): PROBNP in the last 8760 hours.  Studies:  CT Abdomen Pelvis W Contrast  Result Date: 07/04/2020 CLINICAL DATA:  Acute nonlocalized abdominal pain. EXAM: CT ABDOMEN AND PELVIS WITH CONTRAST TECHNIQUE: Multidetector CT imaging of the abdomen and pelvis was performed using the standard protocol following bolus administration of intravenous contrast. CONTRAST:  180mL OMNIPAQUE IOHEXOL 300 MG/ML  SOLN COMPARISON:  CT abdomen pelvis May 07, 2020 . FINDINGS: Lower chest: Bibasilar atelectasis for scarring. Hepatobiliary: Hepatic cysts. No suspicious hepatic lesions. Gallbladder is unremarkable. Similar mild dilation of common bile duct measuring up to 7 mm. Pancreas: Slightly increased chronic dilation of the pancreatic duct which measures 7 mm in the pancreatic head previously 6 mm with unchanged tapering of the duct in the tail to 2 mm. No pancreatic mass or inflammatory changes.  Spleen: Normal in size without focal abnormality. Adrenals/Urinary Tract: Post treatment parenchymal scarring is again seen in the left kidney. Without findings to suggest tumor recurrence. Similar size of the indeterminate hypodense lesion in the interpolar region the right kidney which measures 9 mm (series 2, image 29). No hydronephrosis. Stomach/Bowel: The stomach is predominantly decompressed limiting evaluation the wall. Proximal and distal small bowel appear within normal limits without suspicious wall thickening or dilation. Large wide-mouth ventral hernia which contains nonobstructed colon and loops of small bowel as well as a single loop of mildly dilated small bowel which extends into the wide mouth ventral hernia with a transition to decompressed bowel in the mid abdomen (series 2, image 45) similar location to prior small-bowel obstruction, as well as another transition at the ostia of the hernia (series 2, image 55). The loop of small bowel this fluid and gas filled and demonstrates normal wall enhancement without suspicious thickening. Descending colostomy again visualized in the left lower quadrant. Similar mild colonic diverticulosis without evidence of diverticulitis. Vascular/Lymphatic: Aortic atherosclerosis. No enlarged abdominal or pelvic lymph nodes. Reproductive: Uterus and bilateral adnexa are unremarkable. Other: None Musculoskeletal: No suspicious bone lesions identified. Severe thoracolumbar rotatory levoscoliosis and degenerative changes as well as severe bilateral hip osteoarthritis. IMPRESSION: 1. Large fat and bowel containing wide-mouth ventral hernia with a single mildly dilated, gas and fluid-filled loop of small bowel extending into the hernia sac and a transition to decompressed bowel in the mid abdomen which is in a similar location to the prior small bowel obstruction as well as a second focal transition to decompressed bowel at the ostia of the hernia. However, this loop of  small bowel demonstrate normal wall thickness and enhancement, additionally there is a normal distal bowel gas pattern without upstream dilation. Findings which are suspicious for bowel obstruction with closed loop morphology. However, given the relative normal appearance of proximal and distal bowel as well as the normal enhancement there is not appear to be evidence of vascular compromise at this time. 2. Slightly increased chronic dilation of the main pancreatic duct measuring up to 7 mm with unchanged tapering of the duct in the tail to 2 mm, which was previously evaluated on MRI without discrete obstructive pathology visualized. Further evaluation with ERCP could be provided, if not previously performed. 3. Aortic atherosclerosis. Aortic Atherosclerosis (ICD10-I70.0). The findings of bowel obstruction with potential closed loop morphology were called by telephone at the time of interpretation on 07/04/2020 at 9:42 pm to provider [facility] , who verbally acknowledged these results. Electronically Signed   By: Dahlia Bailiff MD  On: 07/04/2020 21:48       [place]   [facility] If 7PM-7AM, please contact night-coverage at [email or web address], Office  [phone]   07/05/2020, 4:20 PM  LOS: 1 day

## 2020-07-05 NOTE — ED Notes (Signed)
Called report to JT, RN

## 2020-07-05 NOTE — Consult Note (Addendum)
[name] 02/04/1956  [id number].    Requesting MD: Dr. Posey Pronto Chief Complaint/Reason for Consult: SBO, Ventral Hernia   HPI: [name] is a 65 y.o. female with a history of chronic diastolic CHF, atrial flutter status post ablation not on anticoagulation, hypertension, Marfan syndrome, RA on methotrexate and blindness who presented for abdominal pain, nausea and emesis.  Patient has a history of perforated diverticular disease in 2014 for which she underwent a exploratory laparotomy with a descending colostomy by Dr. Lilyan Punt on 11/27/2012.  She developed a bowel obstruction and underwent a laparoscopic lysis of adhesions and enterotomy repair by Dr. Johney Maine on 12/27/2012.  She has been seen by our group before for SBO that has resolved conservatively.  She has a known ventral hernia.  Patient reports on Sunday, 1/9, she began having abdominal pain that she describes as "the sensation of needing to have a bowel movement" with associated nausea, emesis and decreased colostomy output.  She reports yesterday she had little to no output from her colostomy. She feels like her symptoms are similar to when she has had SBO's in the past.  She underwent work-up in the ED including a CT A/P that showed a large fat and bowel containing widemouth ventral hernia with a single mildly dilated, gas and fluid-filled loop of small bowel extending into the hernia sac and a transition to decompressed bowel in the mid abdomen which is in a similar location to the prior small bowel obstruction. This also showed a second focal transition to decompressed bowel at the ostia of the hernia that could represent a closed loop morphology. Radiologist felt that given the relative normal appearance of proximal and distal bowel as well as the normal enhancement there is not appear to be evidence of vascular compromise at this time. WBC wnl. TRH admitted, we were asked to see.  Patient reports that since admission her nausea,  emesis, and abdominal pain has resolved.  She is now having output from her colostomy.  To note patient recently tested positive for COVID-19 at home.  She has a positive diagnosis on PCR here.  She denies any symptoms related to this.   ROS: Review of Systems  Constitutional: Negative for chills and fever.  Respiratory: Negative for cough and shortness of breath.   Cardiovascular: Negative for chest pain and leg swelling.  Gastrointestinal: Positive for abdominal pain, constipation, nausea and vomiting.  All other systems reviewed and are negative.   Family History  Problem Relation Age of Onset  . Heart attack Neg Hx   . Colon cancer Neg Hx   . Osteoporosis Neg Hx     Past Medical History:  Diagnosis Date  . Atrial flutter ([place])   . CHF (congestive heart failure) ([place])   . Chronic gout 2008  . Diverticulitis of large intestine with perforation   . Dysrhythmia   . Fibroid   . Hearing aid worn    pt wears bilateral hearing aids  . Hernia 4/08  . Hx of adenomatous polyp of colon 11/13/2016  . Hypertension   . Legally blind    since pt was a teenager  . Marfan's syndrome affecting skin    with scolosis  . Osteoporosis   . 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 Rheumatoid arthritis [facility])    sees Dr. Gavin Pound   . SBO (small bowel obstruction) ([place]) 01/05/2013  . Small bowel obstruction due to adhesions ([place])   . Status post bunionectomy 1/10  bilateral   . Stroke ([place]) 2/08  . 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 ABLATION  01/04/14   atrial flutter ablation (2 circuits) by Dr Lovena Le  . ATRIAL FLUTTER ABLATION N/A 01/04/2014   Procedure: ATRIAL FLUTTER ABLATION;  Surgeon: Evans Lance, MD;  Location: [facility] CATH LAB;  Service: Cardiovascular;  Laterality: N/A;  . BUNIONECTOMY Bilateral 1/10  . COLON SURGERY     colectomy  . COLOSTOMY   11/27/2012  . HERNIA REPAIR    . IR RADIOLOGIST EVAL & MGMT  10/01/2017  . IR RADIOLOGIST EVAL & MGMT  11/21/2017  . IR RADIOLOGIST EVAL & MGMT  02/13/2018  . IR RADIOLOGIST EVAL & MGMT  01/08/2019  . IR RADIOLOGIST EVAL & MGMT  12/29/2019  . LAPAROSCOPIC ABDOMINAL EXPLORATION N/A 01/06/2013   Procedure: LAPAROSCOPIC ABDOMINAL EXPLORATION;  Surgeon: Adin Hector, MD;  Location: [facility] ORS;  Service: General;  Laterality: N/A;  . LAPAROSCOPIC LYSIS OF ADHESIONS N/A 01/06/2013   Procedure: LAPAROSCOPIC LYSIS OF ADHESIONS/ INTEROTOMY REPAIR;  Surgeon: Adin Hector, MD;  Location: [facility] ORS;  Service: General;  Laterality: N/A;  . LAPAROTOMY N/A 11/27/2012   Procedure: EXPLORATORY LAPAROTOMY SIGMOID COLECTOMY, COLOSTOMY;  Surgeon: Madilyn Hook, DO;  Location: [facility] ORS;  Service: General;  Laterality: N/A;  . RADIOLOGY WITH ANESTHESIA Left 10/23/2017   Procedure: CT RENAL CRYO AND BIOPSY;  Surgeon: Aletta Edouard, MD;  Location: [facility] ORS;  Service: Radiology;  Laterality: Left;  . TOTAL KNEE ARTHROPLASTY Bilateral 6/08    Social History:  reports that she quit smoking about 7 years ago. Her smoking use included cigarettes. She smoked 1.00 pack per day. She has never used smokeless tobacco. She reports that she does not drink alcohol and does not use drugs.  Allergies:  Allergies  Allergen Reactions  . Spinach Itching, Rash and Other (See Comments)    Welts.    (Not in a hospital admission)    Physical Exam: Blood pressure 133/74, pulse 98, temperature 100 F (37.8 C), temperature source Oral, resp. rate 17, last menstrual period 06/25/2006, SpO2 100 %. General: pleasant, elderly female who is laying in bed in NAD HEENT: head is normocephalic, atraumatic.  Sclera are noninjected. Patient is blind.  Ears and nose without any masses or lesions.  Mouth is pink and moist. Dentition poor Heart: regular, rate, and rhythm.  No obvious murmurs noted.  Palpable radial pulses bilaterally  Lungs: CTAB, no wheezes,  rhonchi, or rales noted.  Respiratory effort nonlabored Abd: Patient with a large ventral hernia that is soft and appears to be mostly, if not fully reducuble but returns spontaneously. Her abdomen is soft, ND otherwise and non-tender on exam. No peritonitis. +BS. Colostomy bag 75% full with brown, liquid stool. No masses or organomegaly MS: Chronic deformities of wrist and ankles b/l. No LE edema. Skin: warm and dry with no masses, lesions, or rashes Psych: A&Ox4 with an appropriate affect Neuro: Blind. Moves all extremities. Normal speech, thought process intact   Results for orders placed or performed during the hospital encounter of 07/04/20 (from the past 48 hour(s))  Lipase, blood     Status: None   Collection Time: 07/04/20  5:31 PM  Result Value Ref Range   Lipase 29 11 - 51 U/L    Comment: Performed at [facility], [place]  [address] [employer], [place] [address]  Comprehensive metabolic panel     Status: Abnormal   Collection Time: 07/04/20  5:31 PM  Result Value Ref Range   Sodium 139 135 - 145 mmol/L   Potassium 3.7 3.5 - 5.1 mmol/L   Chloride 100 98 - 111 mmol/L   CO2 28 22 - 32 mmol/L   Glucose, Bld 116 (H) 70 - 99 mg/dL    Comment: Glucose reference range applies only to samples taken after fasting for at least 8 hours.   BUN 12 8 - 23 mg/dL   Creatinine, Ser 1.32 (H) 0.44 - 1.00 mg/dL   Calcium 9.6 8.9 - 10.3 mg/dL   Total Protein 7.3 6.5 - 8.1 g/dL   Albumin 4.0 3.5 - 5.0 g/dL   AST 32 15 - 41 U/L   ALT 26 0 - 44 U/L   Alkaline Phosphatase 79 38 - 126 U/L   Total Bilirubin 0.8 0.3 - 1.2 mg/dL   GFR, Estimated 45 (L) >60 mL/min    Comment: (NOTE) Calculated using the CKD-EPI Creatinine Equation (2021)    Anion gap 11 5 - 15    Comment: Performed at [facility], [place] [address] [place], [place] [address]  CBC     Status: None   Collection Time: 07/04/20  5:31 PM  Result Value Ref Range   WBC 7.1 4.0 - 10.5 K/uL   RBC 4.09 3.87  - 5.11 MIL/uL   Hemoglobin 12.5 12.0 - 15.0 g/dL   HCT 38.2 36.0 - 46.0 %   MCV 93.4 80.0 - 100.0 fL   MCH 30.6 26.0 - 34.0 pg   MCHC 32.7 30.0 - 36.0 g/dL   RDW 14.5 11.5 - 15.5 %   Platelets 264 150 - 400 K/uL   nRBC 0.0 0.0 - 0.2 %    Comment: Performed at [facility], [place] [address] [place], [place] [address]  Resp Panel by RT-PCR (Flu A&B, Covid) Nasopharyngeal Swab     Status: Abnormal   Collection Time: 07/04/20  7:47 PM   Specimen: Nasopharyngeal Swab; Nasopharyngeal(NP) swabs in vial transport medium  Result Value Ref Range   SARS Coronavirus 2 by RT PCR POSITIVE (A) NEGATIVE    Comment: LACIVITA H. 01.10.21 @ 2307 BY MECIAL J. RESULT CALLED TO, READ BACK BY AND VERIFIED WITH: (NOTE) SARS-CoV-2 target nucleic acids are DETECTED.  The SARS-CoV-2 RNA is generally detectable in upper respiratory specimens during the acute phase of infection. Positive results are indicative of the presence of the identified virus, but do not rule out bacterial infection or co-infection with other pathogens not detected by the test. Clinical correlation with patient history and other diagnostic information is necessary to determine patient infection status. The expected result is Negative.  Fact Sheet for Patients: EntrepreneurPulse.com.au  Fact Sheet for Healthcare Providers: IncredibleEmployment.be  This test is not yet approved or cleared by the [place] FDA and  has been authorized for detection and/or diagnosis of SARS-CoV-2 by FDA under an Emergency Use Authorization (EUA).  This EUA will remain in effect (meaning this test  can be used) for the duration of  the COVID-19 declaration under Section 564(b)(1) of the Act, 21 U.S.C. section 360bbb-3(b)(1), unless the authorization is terminated or revoked sooner.     Influenza A by PCR NEGATIVE NEGATIVE   Influenza B by PCR NEGATIVE NEGATIVE    Comment: (NOTE) The Xpert  Xpress SARS-CoV-2/FLU/RSV plus assay is intended as an aid in the diagnosis of  influenza from Nasopharyngeal swab specimens and should not be used as a sole basis for treatment. Nasal washings and aspirates are unacceptable for Xpert Xpress SARS-CoV-2/FLU/RSV testing.  Fact Sheet for Patients: EntrepreneurPulse.com.au  Fact Sheet for Healthcare Providers: IncredibleEmployment.be  This test is not yet approved or cleared by the [place] FDA and has been authorized for detection and/or diagnosis of SARS-CoV-2 by FDA under an Emergency Use Authorization (EUA). This EUA will remain in effect (meaning this test can be used) for the duration of the COVID-19 declaration under Section 564(b)(1) of the Act, 21 U.S.C. section 360bbb-3(b)(1), unless the authorization is terminated or revoked.  Performed at [facility], [place] [address] [place], [place] [address]   CBC with Differential/Platelet     Status: Abnormal   Collection Time: 07/05/20  4:00 AM  Result Value Ref Range   WBC 5.1 4.0 - 10.5 K/uL   RBC 3.73 (L) 3.87 - 5.11 MIL/uL   Hemoglobin 11.6 (L) 12.0 - 15.0 g/dL   HCT 36.4 36.0 - 46.0 %   MCV 97.6 80.0 - 100.0 fL   MCH 31.1 26.0 - 34.0 pg   MCHC 31.9 30.0 - 36.0 g/dL   RDW 14.6 11.5 - 15.5 %   Platelets 221 150 - 400 K/uL   nRBC 0.0 0.0 - 0.2 %   Neutrophils Relative % 57 %   Neutro Abs 2.9 1.7 - 7.7 K/uL   Lymphocytes Relative 28 %   Lymphs Abs 1.4 0.7 - 4.0 K/uL   Monocytes Relative 14 %   Monocytes Absolute 0.7 0.1 - 1.0 K/uL   Eosinophils Relative 1 %   Eosinophils Absolute 0.0 0.0 - 0.5 K/uL   Basophils Relative 0 %   Basophils Absolute 0.0 0.0 - 0.1 K/uL   Immature Granulocytes 0 %   Abs Immature Granulocytes 0.01 0.00 - 0.07 K/uL    Comment: Performed at [facility], [place] [address] [place], [place] [address]  Comprehensive metabolic panel     Status: Abnormal   Collection Time:  07/05/20  4:00 AM  Result Value Ref Range   Sodium 141 135 - 145 mmol/L   Potassium 3.7 3.5 - 5.1 mmol/L   Chloride 103 98 - 111 mmol/L   CO2 29 22 - 32 mmol/L   Glucose, Bld 96 70 - 99 mg/dL    Comment: Glucose reference range applies only to samples taken after fasting for at least 8 hours.   BUN 14 8 - 23 mg/dL   Creatinine, Ser 1.13 (H) 0.44 - 1.00 mg/dL   Calcium 8.4 (L) 8.9 - 10.3 mg/dL   Total Protein 6.5 6.5 - 8.1 g/dL   Albumin 3.5 3.5 - 5.0 g/dL   AST 28 15 - 41 U/L   ALT 23 0 - 44 U/L   Alkaline Phosphatase 68 38 - 126 U/L   Total Bilirubin 0.6 0.3 - 1.2 mg/dL   GFR, Estimated 54 (L) >60 mL/min    Comment: (NOTE) Calculated using the CKD-EPI Creatinine Equation (2021)    Anion gap 9 5 - 15    Comment: Performed at [facility], [place] [address] [place], [place] [address]  Magnesium     Status: None   Collection Time: 07/05/20  4:00 AM  Result Value Ref Range   Magnesium 1.8 1.7 - 2.4 mg/dL    Comment: Performed at [facility], [place] [address] [place],  [address]  Phosphorus     Status: Abnormal  Collection Time: 07/05/20  4:00 AM  Result Value Ref Range   Phosphorus 5.2 (H) 2.5 - 4.6 mg/dL    Comment: Performed at [facility], [place] [address], [place], [place] [address]   CT Abdomen Pelvis W Contrast  Result Date: 07/04/2020 CLINICAL DATA:  Acute nonlocalized abdominal pain. EXAM: CT ABDOMEN AND PELVIS WITH CONTRAST TECHNIQUE: Multidetector CT imaging of the abdomen and pelvis was performed using the standard protocol following bolus administration of intravenous contrast. CONTRAST:  172mL OMNIPAQUE IOHEXOL 300 MG/ML  SOLN COMPARISON:  CT abdomen pelvis May 07, 2020 . FINDINGS: Lower chest: Bibasilar atelectasis for scarring. Hepatobiliary: Hepatic cysts. No suspicious hepatic lesions. Gallbladder is unremarkable. Similar mild dilation of common bile duct measuring up to 7 mm. Pancreas: Slightly  increased chronic dilation of the pancreatic duct which measures 7 mm in the pancreatic head previously 6 mm with unchanged tapering of the duct in the tail to 2 mm. No pancreatic mass or inflammatory changes. Spleen: Normal in size without focal abnormality. Adrenals/Urinary Tract: Post treatment parenchymal scarring is again seen in the left kidney. Without findings to suggest tumor recurrence. Similar size of the indeterminate hypodense lesion in the interpolar region the right kidney which measures 9 mm (series 2, image 29). No hydronephrosis. Stomach/Bowel: The stomach is predominantly decompressed limiting evaluation the wall. Proximal and distal small bowel appear within normal limits without suspicious wall thickening or dilation. Large wide-mouth ventral hernia which contains nonobstructed colon and loops of small bowel as well as a single loop of mildly dilated small bowel which extends into the wide mouth ventral hernia with a transition to decompressed bowel in the mid abdomen (series 2, image 45) similar location to prior small-bowel obstruction, as well as another transition at the ostia of the hernia (series 2, image 55). The loop of small bowel this fluid and gas filled and demonstrates normal wall enhancement without suspicious thickening. Descending colostomy again visualized in the left lower quadrant. Similar mild colonic diverticulosis without evidence of diverticulitis. Vascular/Lymphatic: Aortic atherosclerosis. No enlarged abdominal or pelvic lymph nodes. Reproductive: Uterus and bilateral adnexa are unremarkable. Other: None Musculoskeletal: No suspicious bone lesions identified. Severe thoracolumbar rotatory levoscoliosis and degenerative changes as well as severe bilateral hip osteoarthritis. IMPRESSION: 1. Large fat and bowel containing wide-mouth ventral hernia with a single mildly dilated, gas and fluid-filled loop of small bowel extending into the hernia sac and a transition to  decompressed bowel in the mid abdomen which is in a similar location to the prior small bowel obstruction as well as a second focal transition to decompressed bowel at the ostia of the hernia. However, this loop of small bowel demonstrate normal wall thickness and enhancement, additionally there is a normal distal bowel gas pattern without upstream dilation. Findings which are suspicious for bowel obstruction with closed loop morphology. However, given the relative normal appearance of proximal and distal bowel as well as the normal enhancement there is not appear to be evidence of vascular compromise at this time. 2. Slightly increased chronic dilation of the main pancreatic duct measuring up to 7 mm with unchanged tapering of the duct in the tail to 2 mm, which was previously evaluated on MRI without discrete obstructive pathology visualized. Further evaluation with ERCP could be provided, if not previously performed. 3. Aortic atherosclerosis. Aortic Atherosclerosis (ICD10-I70.0). The findings of bowel obstruction with potential closed loop morphology were called by telephone at the time of interpretation on 07/04/2020 at 9:42 pm to provider LINDSEY LAYDEN ,  who verbally acknowledged these results. Electronically Signed   By: Dahlia Bailiff MD   On: 07/04/2020 21:48   Anti-infectives (From admission, onward)   None       Assessment/Plan Hx of Chronic diastolic CHF Atrial flutter status post ablation not on anticoagulation Hypertension Marfan syndrome RA on methotrexate  Blind COVID-19 + Hx of exploratory laparotomy with a descending colostomy by Dr. Lilyan Punt on 11/27/2012 for perforated diverticular disease  SBO  Ventral Hernia  - CT A/P that showed a large fat and bowel containing widemouth ventral hernia with a single mildly dilated, gas and fluid-filled loop of small bowel extending into the hernia sac and a transition to decompressed bowel in the mid abdomen which is in a similar location to  the prior small bowel obstruction. This also showed a second focal transition to decompressed bowel at the ostia of the hernia that could represent a closed loop morphology. Radiologist felt that given the relative normal appearance of proximal and distal bowel as well as the normal enhancement there is not appear to be evidence of vascular compromise at this time.  - WBC wnl. No present tachycardia or hypotension. Her abdomen is soft, and NT. Her hernia is fully, if not, mostly reducible but returns spontaneously. No indication for emergency surgery.  - Patients symptoms of abdominal pain, n/v have resolved. She is now having colostomy output. She would like to hold off on NGT which I think is reasonable.  - Discussed with my attending, will plan for PO SBO protocol.  - Keep K > 4 and Mg > 2 for bowel function - Mobilize as able for bowel function - We will follow with you  FEN - IVF, NPO VTE - SCDs, okay for chemical prophylaxis from our standpoint ID - None  Jillyn Ledger, [facility] Surgery 07/05/2020, 8:15 AM Please see Amion for pager number during day hours 7:00am-4:30pm

## 2020-07-05 NOTE — ED Notes (Signed)
Replaced purewick and emptied colostomy bag.

## 2020-07-05 NOTE — Progress Notes (Signed)
Provider responded to Rn request for sips and chips for pt. Rn was confused with reports that pt had received multiple liquids in the ER though the npo order remained. Provider also ordered iv fluids as none were ordered for pt at time of page. Pt will remain npo and Rn will provide oral care throughout shift for the pt. Rn will continue to monitor.

## 2020-07-06 ENCOUNTER — Inpatient Hospital Stay (HOSPITAL_COMMUNITY): Payer: Medicare Other

## 2020-07-06 ENCOUNTER — Telehealth: Payer: Medicare Other

## 2020-07-06 DIAGNOSIS — K56600 Partial intestinal obstruction, unspecified as to cause: Secondary | ICD-10-CM | POA: Diagnosis not present

## 2020-07-06 DIAGNOSIS — I5032 Chronic diastolic (congestive) heart failure: Secondary | ICD-10-CM

## 2020-07-06 DIAGNOSIS — I10 Essential (primary) hypertension: Secondary | ICD-10-CM

## 2020-07-06 DIAGNOSIS — K5720 Diverticulitis of large intestine with perforation and abscess without bleeding: Secondary | ICD-10-CM

## 2020-07-06 DIAGNOSIS — U071 COVID-19: Secondary | ICD-10-CM

## 2020-07-06 DIAGNOSIS — N179 Acute kidney failure, unspecified: Secondary | ICD-10-CM | POA: Diagnosis not present

## 2020-07-06 LAB — COMPREHENSIVE METABOLIC PANEL
ALT: 25 U/L (ref 0–44)
AST: 35 U/L (ref 15–41)
Albumin: 3.4 g/dL — ABNORMAL LOW (ref 3.5–5.0)
Alkaline Phosphatase: 65 U/L (ref 38–126)
Anion gap: 13 (ref 5–15)
BUN: 15 mg/dL (ref 8–23)
CO2: 26 mmol/L (ref 22–32)
Calcium: 8.2 mg/dL — ABNORMAL LOW (ref 8.9–10.3)
Chloride: 107 mmol/L (ref 98–111)
Creatinine, Ser: 0.7 mg/dL (ref 0.44–1.00)
GFR, Estimated: >60 mL/min (ref >60)
Glucose, Bld: 89 mg/dL (ref 70–99)
Potassium: 3.9 mmol/L (ref 3.5–5.1)
Sodium: 146 mmol/L — ABNORMAL HIGH (ref 135–145)
Total Bilirubin: 0.6 mg/dL (ref 0.3–1.2)
Total Protein: 6.5 g/dL (ref 6.5–8.1)

## 2020-07-06 LAB — CBC
HCT: 38.5 % (ref 36.0–46.0)
Hemoglobin: 12.3 g/dL (ref 12.0–15.0)
MCH: 31.4 pg (ref 26.0–34.0)
MCHC: 31.9 g/dL (ref 30.0–36.0)
MCV: 98.2 fL (ref 80.0–100.0)
Platelets: 206 10*3/uL (ref 150–400)
RBC: 3.92 MIL/uL (ref 3.87–5.11)
RDW: 14.7 % (ref 11.5–15.5)
WBC: 4.2 10*3/uL (ref 4.0–10.5)
nRBC: 0 % (ref 0.0–0.2)

## 2020-07-06 LAB — URINALYSIS, ROUTINE W REFLEX MICROSCOPIC
Bilirubin Urine: NEGATIVE
Glucose, UA: NEGATIVE mg/dL
Hgb urine dipstick: NEGATIVE
Ketones, ur: 5 mg/dL — AB
Nitrite: NEGATIVE
Protein, ur: 30 mg/dL — AB
Specific Gravity, Urine: >1.046 — ABNORMAL HIGH (ref 1.005–1.030)
pH: 5 (ref 5.0–8.0)

## 2020-07-06 MED ORDER — DEXTROSE 5 % IV SOLN: INTRAVENOUS | Status: DC

## 2020-07-06 NOTE — Progress Notes (Signed)
[facility] PROGRESS NOTE    Progress Note  [name]  [id number] DOB: 1955-07-16 DOA: 07/04/2020 PCP: [name], MD     Brief Narrative:   [name] is an 65 y.o. female past medical history of perforated diverticulitis status post diverting colostomy in 2014, large ventral hernia recurrent SBO, chronic diastolic heart failure, atrial flutter status post ablation not on anticoagulation Marfan syndrome, rheumatoid arthritis comes into the ED for evaluation of abdominal pain vomiting and decreased output through the ostomy.  Assessment/Plan:   Partial small bowel obstruction [facility]) Surgery was consulted further management per surgery.  Incidental SARS-CoV-2 PCR positive: She is currently asymptomatic not requiring oxygen and has remained afebrile. Will continue to monitor closely. There is an incidental finding she has remained asymptomatic from her COVID infection expected to continue to be asymptomatic.  Acute kidney injury: Likely prerenal, with a baseline creatinine of less than 1 she has been fluid resuscitated and his creatinine has returned to baseline.  Mild mild hyponatremia: Likely due to hypovolemia due to increased colostomy output, will start on D5.  Essential hypertension: Blood pressure is stable, continue to hold amlodipine.  History of rheumatoid arthritis: As an outpatient she is on methotrexate in abatacept.  History of atrial flutter status post ablation 2015: Appears to be in sinus rhythm no anticoagulation required at this point in time.   DVT prophylaxis: lovenox Family Communication:noen Status is: Inpatient  Remains inpatient appropriate because:Hemodynamically unstable   Dispo: The patient is from: Home              Anticipated d/c is to: Home              Anticipated d/c date is: 1 day              Patient currently is not medically stable to d/c.        Code Status:     Code Status Orders  (From  admission, onward)         Start     Ordered   07/04/20 2256  Full code  Continuous        07/04/20 2258        Code Status History    Date Active Date Inactive Code Status Order ID Comments User Context   05/07/2020 1347 05/13/2020 0121 Full Code [id number]  Harold Hedge, MD ED   08/07/2016 2133 08/08/2016 2252 Full Code [id number]  Edwin Dada, MD Inpatient   06/07/2015 0152 06/14/2015 2156 Full Code [id number]  [name], MD Inpatient   11/22/2014 1155 11/27/2014 2251 Full Code [id number]  Elgergawy, Silver Huguenin, MD Inpatient   05/01/2014 1619 05/11/2014 2159 Full Code [id number]  Oswald Hillock, MD Inpatient   01/04/2014 1849 01/07/2014 1450 Full Code [id number]  Evans Lance, MD Inpatient   12/31/2013 1835 01/04/2014 1849 Full Code [id number]  Sheila Oats, MD Inpatient   02/26/2013 0257 02/28/2013 2254 Full Code [id number]  Excell Seltzer, MD Inpatient   Advance Care Planning Activity        IV Access:    Peripheral IV   Procedures and diagnostic studies:   CT Abdomen Pelvis W Contrast  Result Date: 07/04/2020 CLINICAL DATA:  Acute nonlocalized abdominal pain. EXAM: CT ABDOMEN AND PELVIS WITH CONTRAST TECHNIQUE: Multidetector CT imaging of the abdomen and pelvis was performed using the standard protocol following bolus administration of intravenous contrast. CONTRAST:  181mL OMNIPAQUE IOHEXOL 300 MG/ML  SOLN COMPARISON:  CT abdomen pelvis  May 07, 2020 . FINDINGS: Lower chest: Bibasilar atelectasis for scarring. Hepatobiliary: Hepatic cysts. No suspicious hepatic lesions. Gallbladder is unremarkable. Similar mild dilation of common bile duct measuring up to 7 mm. Pancreas: Slightly increased chronic dilation of the pancreatic duct which measures 7 mm in the pancreatic head previously 6 mm with unchanged tapering of the duct in the tail to 2 mm. No pancreatic mass or inflammatory changes. Spleen: Normal in size without focal abnormality. Adrenals/Urinary Tract:  Post treatment parenchymal scarring is again seen in the left kidney. Without findings to suggest tumor recurrence. Similar size of the indeterminate hypodense lesion in the interpolar region the right kidney which measures 9 mm (series 2, image 29). No hydronephrosis. Stomach/Bowel: The stomach is predominantly decompressed limiting evaluation the wall. Proximal and distal small bowel appear within normal limits without suspicious wall thickening or dilation. Large wide-mouth ventral hernia which contains nonobstructed colon and loops of small bowel as well as a single loop of mildly dilated small bowel which extends into the wide mouth ventral hernia with a transition to decompressed bowel in the mid abdomen (series 2, image 45) similar location to prior small-bowel obstruction, as well as another transition at the ostia of the hernia (series 2, image 55). The loop of small bowel this fluid and gas filled and demonstrates normal wall enhancement without suspicious thickening. Descending colostomy again visualized in the left lower quadrant. Similar mild colonic diverticulosis without evidence of diverticulitis. Vascular/Lymphatic: Aortic atherosclerosis. No enlarged abdominal or pelvic lymph nodes. Reproductive: Uterus and bilateral adnexa are unremarkable. Other: None Musculoskeletal: No suspicious bone lesions identified. Severe thoracolumbar rotatory levoscoliosis and degenerative changes as well as severe bilateral hip osteoarthritis. IMPRESSION: 1. Large fat and bowel containing wide-mouth ventral hernia with a single mildly dilated, gas and fluid-filled loop of small bowel extending into the hernia sac and a transition to decompressed bowel in the mid abdomen which is in a similar location to the prior small bowel obstruction as well as a second focal transition to decompressed bowel at the ostia of the hernia. However, this loop of small bowel demonstrate normal wall thickness and enhancement, additionally  there is a normal distal bowel gas pattern without upstream dilation. Findings which are suspicious for bowel obstruction with closed loop morphology. However, given the relative normal appearance of proximal and distal bowel as well as the normal enhancement there is not appear to be evidence of vascular compromise at this time. 2. Slightly increased chronic dilation of the main pancreatic duct measuring up to 7 mm with unchanged tapering of the duct in the tail to 2 mm, which was previously evaluated on MRI without discrete obstructive pathology visualized. Further evaluation with ERCP could be provided, if not previously performed. 3. Aortic atherosclerosis. Aortic Atherosclerosis (ICD10-I70.0). The findings of bowel obstruction with potential closed loop morphology were called by telephone at the time of interpretation on 07/04/2020 at 9:42 pm to provider [facility] , who verbally acknowledged these results. Electronically Signed   By: Dahlia Bailiff MD   On: 07/04/2020 21:48   DG Abd Portable 1V-Small Bowel Obstruction Protocol-initial, 8 hr delay  Result Date: 07/06/2020 CLINICAL DATA:  Small-bowel obstruction with an 8 hour delay. EXAM: PORTABLE ABDOMEN - 1 VIEW COMPARISON:  May 10, 2020 FINDINGS: There is severe levoscoliosis centered in the mid lumbar spine. Oral contrast is noted in the patient's colon. There is no evidence for small bowel obstruction. There are end-stage degenerative changes of the right hip. There are advanced degenerative changes  of the left hip. The urinary bladder is distended and filled with contrast and demonstrates multiple diverticula. There is an old healed fracture of the right inferior pubic ramus. IMPRESSION: 1. No evidence for small bowel obstruction. Oral contrast is noted in the colon. 2. End-stage degenerative changes of the right hip. 3. Distended urinary bladder. Electronically Signed   By: Constance Holster M.D.   On: 07/06/2020 00:29     Medical  Consultants:    None.  Anti-Infectives:   none  Subjective:    [name] she relates she is hungry and thirsty no new complaints this morning.  Objective:    Vitals:   07/05/20 1935 07/05/20 2010 07/06/20 0029 07/06/20 0437  BP:  115/79 118/78 137/77  Pulse:  85 90 86  Resp:  18 18 16   Temp:  99.3 F (37.4 C) 99.3 F (37.4 C) 99.8 F (37.7 C)  TempSrc:  Oral Oral Oral  SpO2:  96% 93% 94%  Weight: 68.5 kg     Height: 5\' 8"  (1.727 m)      SpO2: 94 %   Intake/Output Summary (Last 24 hours) at 07/06/2020 0812 Last data filed at 07/06/2020 0452 Gross per 24 hour  Intake 662.37 ml  Output 2750 ml  Net -2087.63 ml   Filed Weights   07/05/20 1935  Weight: 68.5 kg    Exam: General exam: In no acute distress. Respiratory system: Good air movement and clear to auscultation. Cardiovascular system: S1 & S2 heard, RRR. No JVD. Gastrointestinal system: Abdomen is nondistended, soft and nontender.  Extremities: No pedal edema. Skin: No rashes, lesions or ulcers Psychiatry: Judgement and insight appear normal. Mood & affect appropriate.    Data Reviewed:    Labs: Basic Metabolic Panel: Recent Labs  Lab 07/04/20 1731 07/05/20 0400 07/06/20 0318  NA 139 141 146*  K 3.7 3.7 3.9  CL 100 103 107  CO2 28 29 26   GLUCOSE 116* 96 89  BUN 12 14 15   CREATININE 1.32* 1.13* 0.70  CALCIUM 9.6 8.4* 8.2*  MG  --  1.8  --   PHOS  --  5.2*  --    GFR Estimated Creatinine Clearance: 71.7 mL/min (by C-G formula based on SCr of 0.7 mg/dL). Liver Function Tests: Recent Labs  Lab 07/04/20 1731 07/05/20 0400 07/06/20 0318  AST 32 28 35  ALT 26 23 25   ALKPHOS 79 68 65  BILITOT 0.8 0.6 0.6  PROT 7.3 6.5 6.5  ALBUMIN 4.0 3.5 3.4*   Recent Labs  Lab 07/04/20 1731  LIPASE 29   No results for input(s): AMMONIA in the last 168 hours. Coagulation profile No results for input(s): INR, PROTIME in the last 168 hours. COVID-19 Labs  No results for input(s):  DDIMER, FERRITIN, LDH, CRP in the last 72 hours.  Lab Results  Component Value Date   SARSCOV2NAA POSITIVE (A) 07/04/2020   [place] NEGATIVE 05/07/2020    CBC: Recent Labs  Lab 07/04/20 1731 07/05/20 0400 07/06/20 0318  WBC 7.1 5.1 4.2  NEUTROABS  --  2.9  --   HGB 12.5 11.6* 12.3  HCT 38.2 36.4 38.5  MCV 93.4 97.6 98.2  PLT 264 221 206   Cardiac Enzymes: No results for input(s): CKTOTAL, CKMB, CKMBINDEX, TROPONINI in the last 168 hours. BNP (last 3 results) No results for input(s): PROBNP in the last 8760 hours. CBG: No results for input(s): GLUCAP in the last 168 hours. D-Dimer: No results for input(s): DDIMER in the last 72  hours. Hgb A1c: No results for input(s): HGBA1C in the last 72 hours. Lipid Profile: No results for input(s): CHOL, HDL, LDLCALC, TRIG, CHOLHDL, LDLDIRECT in the last 72 hours. Thyroid function studies: No results for input(s): TSH, T4TOTAL, T3FREE, THYROIDAB in the last 72 hours.  Invalid input(s): FREET3 Anemia work up: No results for input(s): VITAMINB12, FOLATE, FERRITIN, TIBC, IRON, RETICCTPCT in the last 72 hours. Sepsis Labs: Recent Labs  Lab 07/04/20 1731 07/05/20 0400 07/06/20 0318  WBC 7.1 5.1 4.2   Microbiology Recent Results (from the past 240 hour(s))  Resp Panel by RT-PCR (Flu A&B, Covid) Nasopharyngeal Swab     Status: Abnormal   Collection Time: 07/04/20  7:47 PM   Specimen: Nasopharyngeal Swab; Nasopharyngeal(NP) swabs in vial transport medium  Result Value Ref Range Status   SARS Coronavirus 2 by RT PCR POSITIVE (A) NEGATIVE Final    Comment: LACIVITA H. 01.10.21 @ 2307 BY MECIAL J. RESULT CALLED TO, READ BACK BY AND VERIFIED WITH: (NOTE) SARS-CoV-2 target nucleic acids are DETECTED.  The SARS-CoV-2 RNA is generally detectable in upper respiratory specimens during the acute phase of infection. Positive results are indicative of the presence of the identified virus, but do not rule out bacterial infection or  co-infection with other pathogens not detected by the test. Clinical correlation with patient history and other diagnostic information is necessary to determine patient infection status. The expected result is Negative.  Fact Sheet for Patients: EntrepreneurPulse.com.au  Fact Sheet for Healthcare Providers: IncredibleEmployment.be  This test is not yet approved or cleared by the [place] FDA and  has been authorized for detection and/or diagnosis of SARS-CoV-2 by FDA under an Emergency Use Authorization (EUA).  This EUA will remain in effect (meaning this test  can be used) for the duration of  the COVID-19 declaration under Section 564(b)(1) of the Act, 21 U.S.C. section 360bbb-3(b)(1), unless the authorization is terminated or revoked sooner.     Influenza A by PCR NEGATIVE NEGATIVE Final   Influenza B by PCR NEGATIVE NEGATIVE Final    Comment: (NOTE) The Xpert Xpress SARS-CoV-2/FLU/RSV plus assay is intended as an aid in the diagnosis of influenza from Nasopharyngeal swab specimens and should not be used as a sole basis for treatment. Nasal washings and aspirates are unacceptable for Xpert Xpress SARS-CoV-2/FLU/RSV testing.  Fact Sheet for Patients: EntrepreneurPulse.com.au  Fact Sheet for Healthcare Providers: IncredibleEmployment.be  This test is not yet approved or cleared by the [place] FDA and has been authorized for detection and/or diagnosis of SARS-CoV-2 by FDA under an Emergency Use Authorization (EUA). This EUA will remain in effect (meaning this test can be used) for the duration of the COVID-19 declaration under Section 564(b)(1) of the Act, 21 U.S.C. section 360bbb-3(b)(1), unless the authorization is terminated or revoked.  Performed at [facility], [place] [address] [place], [place] [address]      Medications:   . enoxaparin (LOVENOX) injection  40 mg  Subcutaneous Q24H   Continuous Infusions:    LOS: 2 days   Charlynne Cousins  [facility]  07/06/2020, 8:12 AM

## 2020-07-06 NOTE — Progress Notes (Signed)
Subjective: CC: Doing well. Abdominal pain, n/v have resolved. Having colostomy output.   Objective: Vital signs in last 24 hours: Temp:  [99.3 F (37.4 C)-99.8 F (37.7 C)] 99.8 F (37.7 C) (01/12 0437) Pulse Rate:  [85-90] 86 (01/12 0437) Resp:  [15-18] 16 (01/12 0437) BP: (115-137)/(69-79) 137/77 (01/12 0437) SpO2:  [93 %-100 %] 94 % (01/12 0437) Weight:  [68.5 kg] 68.5 kg (01/11 1935) Last BM Date: 07/05/20 (multiple while on floor in colostomy)  Intake/Output from previous day: 01/11 0701 - 01/12 0700 In: 662.4 [I.V.:662.4] Out: 2750 [Urine:550; Stool:2200] Intake/Output this shift: No intake/output data recorded.  PE: Gen: Awake and alert, NAD Lungs: Normal rate and effort  Abd: Patient with a large ventral hernia that is soft and appears to be mostly, if not fully reducuble but returns spontaneously. Her abdomen is soft, ND otherwise and non-tender on exam. No peritonitis. +BS. Colostomy bag with brown, liquid stool and air in bag. No masses or organomegaly  Lab Results:  Recent Labs    07/05/20 0400 07/06/20 0318  WBC 5.1 4.2  HGB 11.6* 12.3  HCT 36.4 38.5  PLT 221 206   BMET Recent Labs    07/05/20 0400 07/06/20 0318  NA 141 146*  K 3.7 3.9  CL 103 107  CO2 29 26  GLUCOSE 96 89  BUN 14 15  CREATININE 1.13* 0.70  CALCIUM 8.4* 8.2*   PT/INR No results for input(s): LABPROT, INR in the last 72 hours. CMP     Component Value Date/Time   NA 146 (H) 07/06/2020 0318   NA 143 09/10/2016 0000   K 3.9 07/06/2020 0318   CL 107 07/06/2020 0318   CO2 26 07/06/2020 0318   GLUCOSE 89 07/06/2020 0318   BUN 15 07/06/2020 0318   BUN 14 09/10/2016 0000   CREATININE 0.70 07/06/2020 0318   CREATININE 0.76 11/19/2017 1024   CALCIUM 8.2 (L) 07/06/2020 0318   PROT 6.5 07/06/2020 0318   ALBUMIN 3.4 (L) 07/06/2020 0318   AST 35 07/06/2020 0318   ALT 25 07/06/2020 0318   ALKPHOS 65 07/06/2020 0318   BILITOT 0.6 07/06/2020 0318   GFRNONAA >60  07/06/2020 0318   GFRNONAA 85 11/19/2017 1024   GFRAA 98 11/19/2017 1024   Lipase     Component Value Date/Time   LIPASE 29 07/04/2020 1731       Studies/Results: CT Abdomen Pelvis W Contrast  Result Date: 07/04/2020 CLINICAL DATA:  Acute nonlocalized abdominal pain. EXAM: CT ABDOMEN AND PELVIS WITH CONTRAST TECHNIQUE: Multidetector CT imaging of the abdomen and pelvis was performed using the standard protocol following bolus administration of intravenous contrast. CONTRAST:  127mL OMNIPAQUE IOHEXOL 300 MG/ML  SOLN COMPARISON:  CT abdomen pelvis May 07, 2020 . FINDINGS: Lower chest: Bibasilar atelectasis for scarring. Hepatobiliary: Hepatic cysts. No suspicious hepatic lesions. Gallbladder is unremarkable. Similar mild dilation of common bile duct measuring up to 7 mm. Pancreas: Slightly increased chronic dilation of the pancreatic duct which measures 7 mm in the pancreatic head previously 6 mm with unchanged tapering of the duct in the tail to 2 mm. No pancreatic mass or inflammatory changes. Spleen: Normal in size without focal abnormality. Adrenals/Urinary Tract: Post treatment parenchymal scarring is again seen in the left kidney. Without findings to suggest tumor recurrence. Similar size of the indeterminate hypodense lesion in the interpolar region the right kidney which measures 9 mm (series 2, image 29). No hydronephrosis. Stomach/Bowel: The stomach is predominantly decompressed limiting  evaluation the wall. Proximal and distal small bowel appear within normal limits without suspicious wall thickening or dilation. Large wide-mouth ventral hernia which contains nonobstructed colon and loops of small bowel as well as a single loop of mildly dilated small bowel which extends into the wide mouth ventral hernia with a transition to decompressed bowel in the mid abdomen (series 2, image 45) similar location to prior small-bowel obstruction, as well as another transition at the ostia of the  hernia (series 2, image 55). The loop of small bowel this fluid and gas filled and demonstrates normal wall enhancement without suspicious thickening. Descending colostomy again visualized in the left lower quadrant. Similar mild colonic diverticulosis without evidence of diverticulitis. Vascular/Lymphatic: Aortic atherosclerosis. No enlarged abdominal or pelvic lymph nodes. Reproductive: Uterus and bilateral adnexa are unremarkable. Other: None Musculoskeletal: No suspicious bone lesions identified. Severe thoracolumbar rotatory levoscoliosis and degenerative changes as well as severe bilateral hip osteoarthritis. IMPRESSION: 1. Large fat and bowel containing wide-mouth ventral hernia with a single mildly dilated, gas and fluid-filled loop of small bowel extending into the hernia sac and a transition to decompressed bowel in the mid abdomen which is in a similar location to the prior small bowel obstruction as well as a second focal transition to decompressed bowel at the ostia of the hernia. However, this loop of small bowel demonstrate normal wall thickness and enhancement, additionally there is a normal distal bowel gas pattern without upstream dilation. Findings which are suspicious for bowel obstruction with closed loop morphology. However, given the relative normal appearance of proximal and distal bowel as well as the normal enhancement there is not appear to be evidence of vascular compromise at this time. 2. Slightly increased chronic dilation of the main pancreatic duct measuring up to 7 mm with unchanged tapering of the duct in the tail to 2 mm, which was previously evaluated on MRI without discrete obstructive pathology visualized. Further evaluation with ERCP could be provided, if not previously performed. 3. Aortic atherosclerosis. Aortic Atherosclerosis (ICD10-I70.0). The findings of bowel obstruction with potential closed loop morphology were called by telephone at the time of interpretation on  07/04/2020 at 9:42 pm to provider [facility] , who verbally acknowledged these results. Electronically Signed   By: Dahlia Bailiff MD   On: 07/04/2020 21:48   DG Abd Portable 1V-Small Bowel Obstruction Protocol-initial, 8 hr delay  Result Date: 07/06/2020 CLINICAL DATA:  Small-bowel obstruction with an 8 hour delay. EXAM: PORTABLE ABDOMEN - 1 VIEW COMPARISON:  May 10, 2020 FINDINGS: There is severe levoscoliosis centered in the mid lumbar spine. Oral contrast is noted in the patient's colon. There is no evidence for small bowel obstruction. There are end-stage degenerative changes of the right hip. There are advanced degenerative changes of the left hip. The urinary bladder is distended and filled with contrast and demonstrates multiple diverticula. There is an old healed fracture of the right inferior pubic ramus. IMPRESSION: 1. No evidence for small bowel obstruction. Oral contrast is noted in the colon. 2. End-stage degenerative changes of the right hip. 3. Distended urinary bladder. Electronically Signed   By: Constance Holster M.D.   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 Lilyan Punt on 11/27/2012 for perforated diverticular disease  SBO  Ventral Hernia  - Her hernia is fully, if not, mostly reducible but returns spontaneously - Clinically and radiographically resolving SBO. Allow CLD. Advance as tolerated to soft diet.   - Keep K > 4 and Mg > 2 for bowel function - Mobilize as able for bowel function - We will follow with you  FEN - Allow CLD. Advance as tolerated to soft diet.   VTE - SCDs, Lovenox  ID - None   LOS: 2 days    Jillyn Ledger , [facility]  Surgery 07/06/2020, 10:02 AM Please see Amion for pager number during day hours 7:00am-4:30pm

## 2020-07-07 ENCOUNTER — Inpatient Hospital Stay (HOSPITAL_COMMUNITY): Payer: Medicare Other

## 2020-07-07 DIAGNOSIS — K56600 Partial intestinal obstruction, unspecified as to cause: Secondary | ICD-10-CM | POA: Diagnosis not present

## 2020-07-07 DIAGNOSIS — I5032 Chronic diastolic (congestive) heart failure: Secondary | ICD-10-CM | POA: Diagnosis not present

## 2020-07-07 DIAGNOSIS — N179 Acute kidney failure, unspecified: Secondary | ICD-10-CM | POA: Diagnosis not present

## 2020-07-07 NOTE — Care Management Important Message (Signed)
Important Message  Patient Details IM Letter placed in Patients door [employer]. Name: [name] MRN: [id number] Date of Birth: January 18, 1956   Medicare Important Message Given:  Yes     Kerin Salen 07/07/2020, 12:27 PM

## 2020-07-07 NOTE — Progress Notes (Signed)
[facility] PROGRESS NOTE    Progress Note  [name]  SEG:[id number] DOB: May 20, 1956 DOA: 07/04/2020 PCP: [name], MD     Brief Narrative:   [name] is an 65 y.o. female past medical history of perforated diverticulitis status post diverting colostomy in 2014, large ventral hernia recurrent SBO, chronic diastolic heart failure, atrial flutter status post ablation not on anticoagulation Marfan syndrome, rheumatoid arthritis comes into the ED for evaluation of abdominal pain vomiting and decreased output through the ostomy.  Assessment/Plan:   Partial small bowel obstruction [facility]) Surgery was consulted further management per surgery. To repeat abdominal x-ray today having good output through her ostomy. Surgery recommended that if her abdominal x-ray is improved we will monitor for an additional 24 hours and see if she tolerates her diet. Diet per surgery.  Incidental SARS-CoV-2 PCR positive: She is currently asymptomatic not requiring oxygen and has remained afebrile. This was an incidental finding she has remained asymptomatic satting greater than 94% on room air.  Acute kidney injury: Likely prerenal, with a baseline creatinine of less than 1 started on IV fluids her creatinine has returned to baseline.  Mild mild hyponatremia: Likely due to hypovolemia due to increased colostomy output. Now resolved.  Essential hypertension: Blood pressure is stable, continue to hold amlodipine.  History of rheumatoid arthritis: As an outpatient she is on methotrexate in abatacept.  History of atrial flutter status post ablation 2015: Appears to be in sinus rhythm no anticoagulation required at this point in time.   DVT prophylaxis: lovenox Family Communication:noen Status is: Inpatient  Remains inpatient appropriate because:Hemodynamically unstable   Dispo: The patient is from: Home              Anticipated d/c is to: Home              Anticipated d/c  date is: 1 day              Patient currently is not medically stable to d/c.  Hopefully home tomorrow morning.        Code Status:     Code Status Orders  (From admission, onward)         Start     Ordered   07/04/20 2256  Full code  Continuous        07/04/20 2258        Code Status History    Date Active Date Inactive Code Status Order ID Comments User Context   05/07/2020 1347 05/13/2020 0121 Full Code [id number]  Harold Hedge, MD ED   08/07/2016 2133 08/08/2016 2252 Full Code [id number]  Edwin Dada, MD Inpatient   06/07/2015 0152 06/14/2015 2156 Full Code [id number]  [name], MD Inpatient   11/22/2014 1155 11/27/2014 2251 Full Code [id number]  Elgergawy, Silver Huguenin, MD Inpatient   05/01/2014 1619 05/11/2014 2159 Full Code [id number]  Oswald Hillock, MD Inpatient   01/04/2014 1849 01/07/2014 1450 Full Code [id number]  Evans Lance, MD Inpatient   12/31/2013 1835 01/04/2014 1849 Full Code [id number]  Sheila Oats, MD Inpatient   02/26/2013 0257 02/28/2013 2254 Full Code [id number]  Excell Seltzer, MD Inpatient   Advance Care Planning Activity        IV Access:    Peripheral IV   Procedures and diagnostic studies:   DG Abd Portable 1V-Small Bowel Obstruction Protocol-24 hr delay  Result Date: 07/06/2020 CLINICAL DATA:  Small bowel obstruction, 24 hour delayed film. EXAM: PORTABLE ABDOMEN -  1 VIEW COMPARISON:  Radiograph earlier today.  CT yesterday. FINDINGS: Oral contrast previously in the proximal colon has progressed to the distal transverse and proximal descending colon. Mild gaseous distention of bowel loops in the lower abdomen persist. There is excreted IV contrast in the urinary bladder, less dense than earlier today. Severe scoliotic curvature of the spine. Advanced right hip osteoarthritis. IMPRESSION: Oral contrast again seen in the colon, contrast previously in the proximal colon has progressed to the distal transverse and proximal descending  colon. Mild gaseous distention of small bowel loops in the lower abdomen persist. Electronically Signed   By: Keith Rake M.D.   On: 07/06/2020 17:27   DG Abd Portable 1V-Small Bowel Obstruction Protocol-initial, 8 hr delay  Result Date: 07/06/2020 CLINICAL DATA:  Small-bowel obstruction with an 8 hour delay. EXAM: PORTABLE ABDOMEN - 1 VIEW COMPARISON:  May 10, 2020 FINDINGS: There is severe levoscoliosis centered in the mid lumbar spine. Oral contrast is noted in the patient's colon. There is no evidence for small bowel obstruction. There are end-stage degenerative changes of the right hip. There are advanced degenerative changes of the left hip. The urinary bladder is distended and filled with contrast and demonstrates multiple diverticula. There is an old healed fracture of the right inferior pubic ramus. IMPRESSION: 1. No evidence for small bowel obstruction. Oral contrast is noted in the colon. 2. End-stage degenerative changes of the right hip. 3. Distended urinary bladder. Electronically Signed   By: Constance Holster M.D.   On: 07/06/2020 00:29     Medical Consultants:    None.  Anti-Infectives:   none  Subjective:    [name] she denies any abdominal pain.  Tolerating her diet.  Objective:    Vitals:   07/06/20 0437 07/06/20 1233 07/06/20 2210 07/07/20 0613  BP: 137/77 137/82 136/87 (!) 146/83  Pulse: 86 82 79 71  Resp: 16 17 15 15   Temp: 99.8 F (37.7 C) 98.3 F (36.8 C) 98.2 F (36.8 C) 98.9 F (37.2 C)  TempSrc: Oral Oral Oral Oral  SpO2: 94% 95% 99% 98%  Weight:      Height:       SpO2: 98 %   Intake/Output Summary (Last 24 hours) at 07/07/2020 1128 Last data filed at 07/07/2020 0631 Gross per 24 hour  Intake 449.81 ml  Output 1905 ml  Net -1455.19 ml   Filed Weights   07/05/20 1935  Weight: 68.5 kg    Exam: General exam: In no acute distress. Respiratory system: Good air movement and clear to auscultation. Cardiovascular system:  S1 & S2 heard, RRR. No JVD. Gastrointestinal system: Abdomen is nondistended, soft and nontender.  Extremities: No pedal edema. Skin: No rashes, lesions or ulcers Psychiatry: Judgement and insight appear normal. Mood & affect appropriate..    Data Reviewed:    Labs: Basic Metabolic Panel: Recent Labs  Lab 07/04/20 1731 07/05/20 0400 07/06/20 0318  NA 139 141 146*  K 3.7 3.7 3.9  CL 100 103 107  CO2 28 29 26   GLUCOSE 116* 96 89  BUN 12 14 15   CREATININE 1.32* 1.13* 0.70  CALCIUM 9.6 8.4* 8.2*  MG  --  1.8  --   PHOS  --  5.2*  --    GFR Estimated Creatinine Clearance: 71.7 mL/min (by C-G formula based on SCr of 0.7 mg/dL). Liver Function Tests: Recent Labs  Lab 07/04/20 1731 07/05/20 0400 07/06/20 0318  AST 32 28 35  ALT 26 23 25   ALKPHOS  79 68 65  BILITOT 0.8 0.6 0.6  PROT 7.3 6.5 6.5  ALBUMIN 4.0 3.5 3.4*   Recent Labs  Lab 07/04/20 1731  LIPASE 29   No results for input(s): AMMONIA in the last 168 hours. Coagulation profile No results for input(s): INR, PROTIME in the last 168 hours. COVID-19 Labs  No results for input(s): DDIMER, FERRITIN, LDH, CRP in the last 72 hours.  Lab Results  Component Value Date   SARSCOV2NAA POSITIVE (A) 07/04/2020   [place] NEGATIVE 05/07/2020    CBC: Recent Labs  Lab 07/04/20 1731 07/05/20 0400 07/06/20 0318  WBC 7.1 5.1 4.2  NEUTROABS  --  2.9  --   HGB 12.5 11.6* 12.3  HCT 38.2 36.4 38.5  MCV 93.4 97.6 98.2  PLT 264 221 206   Cardiac Enzymes: No results for input(s): CKTOTAL, CKMB, CKMBINDEX, TROPONINI in the last 168 hours. BNP (last 3 results) No results for input(s): PROBNP in the last 8760 hours. CBG: No results for input(s): GLUCAP in the last 168 hours. D-Dimer: No results for input(s): DDIMER in the last 72 hours. Hgb A1c: No results for input(s): HGBA1C in the last 72 hours. Lipid Profile: No results for input(s): CHOL, HDL, LDLCALC, TRIG, CHOLHDL, LDLDIRECT in the last 72  hours. Thyroid function studies: No results for input(s): TSH, T4TOTAL, T3FREE, THYROIDAB in the last 72 hours.  Invalid input(s): FREET3 Anemia work up: No results for input(s): VITAMINB12, FOLATE, FERRITIN, TIBC, IRON, RETICCTPCT in the last 72 hours. Sepsis Labs: Recent Labs  Lab 07/04/20 1731 07/05/20 0400 07/06/20 0318  WBC 7.1 5.1 4.2   Microbiology Recent Results (from the past 240 hour(s))  Resp Panel by RT-PCR (Flu A&B, Covid) Nasopharyngeal Swab     Status: Abnormal   Collection Time: 07/04/20  7:47 PM   Specimen: Nasopharyngeal Swab; Nasopharyngeal(NP) swabs in vial transport medium  Result Value Ref Range Status   SARS Coronavirus 2 by RT PCR POSITIVE (A) NEGATIVE Final    Comment: LACIVITA H. 01.10.21 @ 2307 BY MECIAL J. RESULT CALLED TO, READ BACK BY AND VERIFIED WITH: (NOTE) SARS-CoV-2 target nucleic acids are DETECTED.  The SARS-CoV-2 RNA is generally detectable in upper respiratory specimens during the acute phase of infection. Positive results are indicative of the presence of the identified virus, but do not rule out bacterial infection or co-infection with other pathogens not detected by the test. Clinical correlation with patient history and other diagnostic information is necessary to determine patient infection status. The expected result is Negative.  Fact Sheet for Patients: EntrepreneurPulse.com.au  Fact Sheet for Healthcare Providers: IncredibleEmployment.be  This test is not yet approved or cleared by the [place] FDA and  has been authorized for detection and/or diagnosis of SARS-CoV-2 by FDA under an Emergency Use Authorization (EUA).  This EUA will remain in effect (meaning this test  can be used) for the duration of  the COVID-19 declaration under Section 564(b)(1) of the Act, 21 U.S.C. section 360bbb-3(b)(1), unless the authorization is terminated or revoked sooner.     Influenza A by PCR  NEGATIVE NEGATIVE Final   Influenza B by PCR NEGATIVE NEGATIVE Final    Comment: (NOTE) The Xpert Xpress SARS-CoV-2/FLU/RSV plus assay is intended as an aid in the diagnosis of influenza from Nasopharyngeal swab specimens and should not be used as a sole basis for treatment. Nasal washings and aspirates are unacceptable for Xpert Xpress SARS-CoV-2/FLU/RSV testing.  Fact Sheet for Patients: EntrepreneurPulse.com.au  Fact Sheet for Healthcare Providers: IncredibleEmployment.be  This  test is not yet approved or cleared by the [place] and has been authorized for detection and/or diagnosis of SARS-CoV-2 by FDA under an Emergency Use Authorization (EUA). This EUA will remain in effect (meaning this test can be used) for the duration of the COVID-19 declaration under Section 564(b)(1) of the Act, 21 U.S.C. section 360bbb-3(b)(1), unless the authorization is terminated or revoked.  Performed at [facility], [place] [address], [place], [place] [address]      Medications:   . enoxaparin (LOVENOX) injection  40 mg Subcutaneous Q24H   Continuous Infusions:    LOS: 3 days   Charlynne Cousins  [facility]  07/07/2020, 11:28 AM

## 2020-07-07 NOTE — Progress Notes (Signed)
Progress Note     Subjective: Patient reports some decreased appetite overnight but not overtly nauseated. Still having output from colostomy. Reports area just medial to colostomy feels a little firmer to her but it is not significantly ttp.   Objective: Vital signs in last 24 hours: Temp:  [98.2 F (36.8 C)-98.9 F (37.2 C)] 98.9 F (37.2 C) (01/13 5176) Pulse Rate:  [71-82] 71 (01/13 0613) Resp:  [15-17] 15 (01/13 0613) BP: (136-146)/(82-87) 146/83 (01/13 0613) SpO2:  [95 %-99 %] 98 % (01/13 0613) Last BM Date: 07/06/20  Intake/Output from previous day: 01/12 0701 - 01/13 0700 In: 449.8 [P.O.:250; I.V.:199.8] Out: 1905 [Urine:1500; Stool:405] Intake/Output this shift: No intake/output data recorded.  PE: Gen: Awake and alert, NAD Lungs: Normal rate and effort  HYW:VPXTGGY with a large ventral hernia that is soft and appears to be mostly, if not fully reducuble but returns spontaneously. Her abdomen is soft, ND otherwise and non-tender on exam. No peritonitis.+BS. Colostomy bag with brown, liquid stool and air in bag. No masses or organomegaly   Lab Results:  Recent Labs    07/05/20 0400 07/06/20 0318  WBC 5.1 4.2  HGB 11.6* 12.3  HCT 36.4 38.5  PLT 221 206   BMET Recent Labs    07/05/20 0400 07/06/20 0318  NA 141 146*  K 3.7 3.9  CL 103 107  CO2 29 26  GLUCOSE 96 89  BUN 14 15  CREATININE 1.13* 0.70  CALCIUM 8.4* 8.2*   PT/INR No results for input(s): LABPROT, INR in the last 72 hours. CMP     Component Value Date/Time   NA 146 (H) 07/06/2020 0318   NA 143 09/10/2016 0000   K 3.9 07/06/2020 0318   CL 107 07/06/2020 0318   CO2 26 07/06/2020 0318   GLUCOSE 89 07/06/2020 0318   BUN 15 07/06/2020 0318   BUN 14 09/10/2016 0000   CREATININE 0.70 07/06/2020 0318   CREATININE 0.76 11/19/2017 1024   CALCIUM 8.2 (L) 07/06/2020 0318   PROT 6.5 07/06/2020 0318   ALBUMIN 3.4 (L) 07/06/2020 0318   AST 35 07/06/2020 0318   ALT 25 07/06/2020 0318    ALKPHOS 65 07/06/2020 0318   BILITOT 0.6 07/06/2020 0318   GFRNONAA >60 07/06/2020 0318   GFRNONAA 85 11/19/2017 1024   GFRAA 98 11/19/2017 1024   Lipase     Component Value Date/Time   LIPASE 29 07/04/2020 1731       Studies/Results: DG Abd Portable 1V-Small Bowel Obstruction Protocol-24 hr delay  Result Date: 07/06/2020 CLINICAL DATA:  Small bowel obstruction, 24 hour delayed film. EXAM: PORTABLE ABDOMEN - 1 VIEW COMPARISON:  Radiograph earlier today.  CT yesterday. FINDINGS: Oral contrast previously in the proximal colon has progressed to the distal transverse and proximal descending colon. Mild gaseous distention of bowel loops in the lower abdomen persist. There is excreted IV contrast in the urinary bladder, less dense than earlier today. Severe scoliotic curvature of the spine. Advanced right hip osteoarthritis. IMPRESSION: Oral contrast again seen in the colon, contrast previously in the proximal colon has progressed to the distal transverse and proximal descending colon. Mild gaseous distention of small bowel loops in the lower abdomen persist. Electronically Signed   By: Keith Rake M.D.   On: 07/06/2020 17:27   DG Abd Portable 1V-Small Bowel Obstruction Protocol-initial, 8 hr delay  Result Date: 07/06/2020 CLINICAL DATA:  Small-bowel obstruction with an 8 hour delay. EXAM: PORTABLE ABDOMEN - 1 VIEW COMPARISON:  May 10, 2020 FINDINGS: There is severe levoscoliosis centered in the mid lumbar spine. Oral contrast is noted in the patient's colon. There is no evidence for small bowel obstruction. There are end-stage degenerative changes of the right hip. There are advanced degenerative changes of the left hip. The urinary bladder is distended and filled with contrast and demonstrates multiple diverticula. There is an old healed fracture of the right inferior pubic ramus. IMPRESSION: 1. No evidence for small bowel obstruction. Oral contrast is noted in the colon. 2. End-stage  degenerative changes of the right hip. 3. Distended urinary bladder. Electronically Signed   By: Constance Holster M.D.   On: 07/06/2020 00:29    Anti-infectives: Anti-infectives (From admission, onward)   None       Assessment/Plan Hx of Chronic diastolic CHF Atrial flutter status post ablation not on anticoagulation Hypertension Marfan syndrome RA on methotrexate Blind COVID-19 + Hx ofexploratory laparotomy with a descending colostomy by Dr. Lilyan Punt on 11/27/2012 for perforated diverticular disease  SBO  Ventral Hernia - Her hernia is fully, if not, mostly reducible but returns spontaneously -Clinically and radiographically resolving SBO. Tolerating FLD but complains of some decreased appetite today and some abdominal pain - repeat abdominal film this AM, if stable then ok to advance as tolerated to soft diet  - Keep K > 4and Mg > 70for bowel function - Mobilize as able for bowel function - We will follow with you  FEN -FLD  VTE -SCDs, Lovenox  ID -None  LOS: 3 days    Norm Parcel , [facility] Surgery 07/07/2020, 11:26 AM Please see Amion for pager number during day hours 7:00am-4:30pm

## 2020-07-07 NOTE — Plan of Care (Signed)

## 2020-07-08 DIAGNOSIS — K56600 Partial intestinal obstruction, unspecified as to cause: Secondary | ICD-10-CM | POA: Diagnosis not present

## 2020-07-08 DIAGNOSIS — N179 Acute kidney failure, unspecified: Secondary | ICD-10-CM | POA: Diagnosis not present

## 2020-07-08 DIAGNOSIS — K56609 Unspecified intestinal obstruction, unspecified as to partial versus complete obstruction: Secondary | ICD-10-CM

## 2020-07-08 DIAGNOSIS — I5032 Chronic diastolic (congestive) heart failure: Secondary | ICD-10-CM | POA: Diagnosis not present

## 2020-07-08 DIAGNOSIS — U071 COVID-19: Secondary | ICD-10-CM | POA: Diagnosis not present

## 2020-07-08 MED ORDER — ACETAMINOPHEN 500 MG PO TABS: 1000.0000 mg | ORAL_TABLET | Freq: Three times a day (TID) | ORAL | Status: DC | PRN

## 2020-07-08 NOTE — Progress Notes (Addendum)
Daughter has not shown to pick up her mom for d/c. Called twice with no answer. Notified on call MD Kennon Holter). Per MD, will hold off on pt d/c until tomorrow.

## 2020-07-08 NOTE — Progress Notes (Signed)
Called and Spoke with pt's daughter Haliyah Fryman. She stated that she is still at work and should be here to pick her mom up in the next hour.

## 2020-07-08 NOTE — Discharge Summary (Signed)
Physician Discharge Summary  [name] XBM:[id number] DOB: 05-26-1956 DOA: 07/04/2020  PCP: [name], MD  Admit date: 07/04/2020 Discharge date: 07/08/2020  Admitted From: Home Disposition:  Home  Recommendations for Outpatient Follow-up:  1. Follow up with Surgery in 1-2 weeks 2. Please obtain BMP/CBC in one week   Home Health:Yes Equipment/Devices:None  Discharge Condition:Stable CODE STATUS:Full Diet recommendation: Heart Healthy  Brief/Interim Summary: 65 y.o. 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 She'll follow-up with surgery as an outpatient she tolerated her diet on the day of discharge.  Incidental SARS-CoV-2 PCR positivity: Currently asymptomatic not requiring oxygen she'll need to be isolated for 10 days total.  Acute kidney injury, With a baseline creatinine of less than one return to baseline with IV fluid hydration.  Mild hypovolemic hyponatremia: Likely due to increase ostomy output now resolved.  Essential hypertension: Blood pressure stable no changes made to her medication.  History of rheumatoid arthritis: Continue  methotrexate and abatacept as an outpatient  History of a flutter status post ablation in 2015: Appears to be in sinus rhythm in-house no anticoagulation required.   Discharge Instructions  Discharge Instructions    Diet - low sodium heart healthy   Complete by: As directed    Increase activity slowly   Complete by: As directed      Allergies as of 07/08/2020      Reactions   Spinach Itching, Rash, Other (See Comments)   Welts.      Medication List    TAKE these medications   Abatacept 125 MG/ML Sosy Inject 125 mg into the skin every Monday.   albuterol 108 (90 Base) MCG/ACT inhaler Commonly known as: VENTOLIN HFA Inhale 1 puff into the lungs every 4 (four) hours as needed for wheezing or shortness of breath.   allopurinol 100 MG tablet Commonly known as: ZYLOPRIM TAKE 1 TABLET BY MOUTH EVERY DAY   amLODipine 10 MG tablet Commonly known as: NORVASC TAKE 1 TABLET BY MOUTH EVERY DAY   amoxicillin 500 MG capsule Commonly known as: AMOXIL Take 1 capsule (500 mg total) by mouth 3 (three) times daily. What changed: additional instructions   aspirin EC 81 MG tablet Take 1 tablet (81 mg total) by mouth daily.   CALCIUM PO Take 1 tablet by mouth 2 (two) times daily.   cetirizine 10 MG tablet Commonly known as: ZYRTEC Take 1 tablet (10 mg total) by mouth daily.   Ciprodex OTIC suspension Generic drug: ciprofloxacin-dexamethasone INSTILL 4 DROPS UNTO EACH EAR TWICE DAILY AS NEEDED What changed: See the new instructions.   denosumab 60 MG/ML Sosy injection Commonly known as: PROLIA Inject 60 mg into the skin every 6 (six) months.   diclofenac sodium 1 % Gel Commonly known as: VOLTAREN Apply 1 application topically  4 (four) times daily as needed (arthritis).   docusate sodium 100 MG capsule Commonly known as: COLACE Take 100 mg by mouth 2 (two) times daily.   esomeprazole 40 MG capsule Commonly known as: NEXIUM Take 1 capsule (40 mg total) by mouth daily in  the afternoon.   folic acid 875 MCG tablet Commonly known as: FOLVITE Take 400 mcg by mouth daily.   HYDROcodone-acetaminophen 10-325 MG tablet Commonly known as: NORCO Take 1 tablet by mouth every 6 (six) hours as needed for moderate pain.   ketoconazole 2 % shampoo Commonly known as: NIZORAL Apply 1 application topically 2 (two) times a week.   megestrol 625 MG/5ML suspension Commonly known as: MEGACE ES take 5 milliliters by mouth three times daily before meals as needed for appetite What changed:   how much to take  how to take this  when to take this  reasons to take this  additional instructions   methotrexate (PF) 50 MG/2ML injection Inject 0.5 mLs into the muscle once a week. wednesday   multivitamin with minerals Tabs tablet Take 1 tablet by mouth daily.   Peridex 0.12 % solution Generic drug: chlorhexidine Use as directed 15 mLs in the mouth or throat 2 (two) times daily.   polyethylene glycol 17 g packet Commonly known as: MIRALAX / GLYCOLAX Take 17 g by mouth daily as needed for mild constipation.   tiZANidine 4 MG tablet Commonly known as: ZANAFLEX TAKE 1 TABLET(4 MG) BY MOUTH TWICE DAILY What changed: See the new instructions.   traMADol 50 MG tablet Commonly known as: ULTRAM TAKE 1 TO 2 TABLETS BY MOUTH EVERY 6 HOURS AS NEEDED What changed:   how much to take  how to take this  when to take this  additional instructions   triamcinolone lotion 0.1 % Commonly known as: KENALOG Apply 1 application topically 2 (two) times daily as needed (itching).   vitamin B-12 500 MCG tablet Commonly known as: CYANOCOBALAMIN Take 500 mcg by mouth daily.   vitamin C 500 MG tablet Commonly known as: ASCORBIC ACID Take 500 mg by mouth daily.   Vitamin D 50 MCG (2000 UT) Caps Take 2,000 Units by mouth daily.       Allergies  Allergen Reactions  . Spinach Itching, Rash and Other (See Comments)    Welts.    Consultations:  General  surgery   Procedures/Studies: CT Abdomen Pelvis W Contrast  Result Date: 07/04/2020 CLINICAL DATA:  Acute nonlocalized abdominal pain. EXAM: CT ABDOMEN AND PELVIS WITH CONTRAST TECHNIQUE: Multidetector CT imaging of the abdomen and pelvis was performed using the standard protocol following bolus administration of intravenous contrast. CONTRAST:  168mL OMNIPAQUE IOHEXOL 300 MG/ML  SOLN COMPARISON:  CT abdomen pelvis May 07, 2020 . FINDINGS: Lower chest: Bibasilar atelectasis for scarring. Hepatobiliary: Hepatic cysts. No suspicious hepatic lesions. Gallbladder is unremarkable. Similar mild dilation of common bile duct measuring up to 7 mm. Pancreas: Slightly increased chronic dilation of the pancreatic duct which measures 7 mm in the pancreatic head previously 6 mm with unchanged tapering of the duct in the tail to 2 mm. No pancreatic mass or inflammatory changes. Spleen: Normal in size without focal abnormality. Adrenals/Urinary Tract: Post treatment parenchymal scarring is again seen in the left kidney. Without findings to suggest tumor recurrence. Similar size of the indeterminate hypodense lesion in the interpolar region the right kidney which measures 9 mm (series 2, image 29). No hydronephrosis. Stomach/Bowel: The stomach is predominantly decompressed limiting evaluation the wall. Proximal  and distal small bowel appear within normal limits without suspicious wall thickening or dilation. Large wide-mouth ventral hernia which contains nonobstructed colon and loops of small bowel as well as a single loop of mildly dilated small bowel which extends into the wide mouth ventral hernia with a transition to decompressed bowel in the mid abdomen (series 2, image 45) similar location to prior small-bowel obstruction, as well as another transition at the ostia of the hernia (series 2, image 55). The loop of small bowel this fluid and gas filled and demonstrates normal wall enhancement without suspicious  thickening. Descending colostomy again visualized in the left lower quadrant. Similar mild colonic diverticulosis without evidence of diverticulitis. Vascular/Lymphatic: Aortic atherosclerosis. No enlarged abdominal or pelvic lymph nodes. Reproductive: Uterus and bilateral adnexa are unremarkable. Other: None Musculoskeletal: No suspicious bone lesions identified. Severe thoracolumbar rotatory levoscoliosis and degenerative changes as well as severe bilateral hip osteoarthritis. IMPRESSION: 1. Large fat and bowel containing wide-mouth ventral hernia with a single mildly dilated, gas and fluid-filled loop of small bowel extending into the hernia sac and a transition to decompressed bowel in the mid abdomen which is in a similar location to the prior small bowel obstruction as well as a second focal transition to decompressed bowel at the ostia of the hernia. However, this loop of small bowel demonstrate normal wall thickness and enhancement, additionally there is a normal distal bowel gas pattern without upstream dilation. Findings which are suspicious for bowel obstruction with closed loop morphology. However, given the relative normal appearance of proximal and distal bowel as well as the normal enhancement there is not appear to be evidence of vascular compromise at this time. 2. Slightly increased chronic dilation of the main pancreatic duct measuring up to 7 mm with unchanged tapering of the duct in the tail to 2 mm, which was previously evaluated on MRI without discrete obstructive pathology visualized. Further evaluation with ERCP could be provided, if not previously performed. 3. Aortic atherosclerosis. Aortic Atherosclerosis (ICD10-I70.0). The findings of bowel obstruction with potential closed loop morphology were called by telephone at the time of interpretation on 07/04/2020 at 9:42 pm to provider [facility] , who verbally acknowledged these results. Electronically Signed   By: Dahlia Bailiff MD   On:  07/04/2020 21:48   DG Abd Portable 1V  Result Date: 07/07/2020 CLINICAL DATA:  Small bowel obstruction. EXAM: PORTABLE ABDOMEN - 1 VIEW COMPARISON:  Plain films the abdomen 07/06/2020 05/10/2020. CT abdomen and pelvis 07/04/2020. FINDINGS: Contrast in the transverse colon seen on the most recent plain film is no longer identified. There is persistent gaseous distention of small bowel loops in the mid abdomen is up to approximately 6 cm, not notably changed. Ostomy in the left abdomen again seen. Severe scoliosis and right hip osteoarthritis noted. IMPRESSION: No change in gaseous distention of small bowel. Contrast in the colon seen on the most recent exam is passed. Questions partial small bowel obstruction. Electronically Signed   By: Inge Rise M.D.   On: 07/07/2020 12:50   DG Abd Portable 1V-Small Bowel Obstruction Protocol-24 hr delay  Result Date: 07/06/2020 CLINICAL DATA:  Small bowel obstruction, 24 hour delayed film. EXAM: PORTABLE ABDOMEN - 1 VIEW COMPARISON:  Radiograph earlier today.  CT yesterday. FINDINGS: Oral contrast previously in the proximal colon has progressed to the distal transverse and proximal descending colon. Mild gaseous distention of bowel loops in the lower abdomen persist. There is excreted IV contrast in the urinary bladder, less dense than earlier today.  Severe scoliotic curvature of the spine. Advanced right hip osteoarthritis. IMPRESSION: Oral contrast again seen in the colon, contrast previously in the proximal colon has progressed to the distal transverse and proximal descending colon. Mild gaseous distention of small bowel loops in the lower abdomen persist. Electronically Signed   By: Keith Rake M.D.   On: 07/06/2020 17:27   DG Abd Portable 1V-Small Bowel Obstruction Protocol-initial, 8 hr delay  Result Date: 07/06/2020 CLINICAL DATA:  Small-bowel obstruction with an 8 hour delay. EXAM: PORTABLE ABDOMEN - 1 VIEW COMPARISON:  May 10, 2020 FINDINGS:  There is severe levoscoliosis centered in the mid lumbar spine. Oral contrast is noted in the patient's colon. There is no evidence for small bowel obstruction. There are end-stage degenerative changes of the right hip. There are advanced degenerative changes of the left hip. The urinary bladder is distended and filled with contrast and demonstrates multiple diverticula. There is an old healed fracture of the right inferior pubic ramus. IMPRESSION: 1. No evidence for small bowel obstruction. Oral contrast is noted in the colon. 2. End-stage degenerative changes of the right hip. 3. Distended urinary bladder. Electronically Signed   By: Constance Holster M.D.   On: 07/06/2020 00:29     Subjective: No new complaints feels great.  Discharge Exam: Vitals:   07/07/20 1431 07/08/20 0530  BP: 135/80 133/79  Pulse: 69 71  Resp: 20 16  Temp: 99.1 F (37.3 C) 98.6 F (37 C)  SpO2: 93% 100%   Vitals:   07/06/20 2210 07/07/20 0613 07/07/20 1431 07/08/20 0530  BP: 136/87 (!) 146/83 135/80 133/79  Pulse: 79 71 69 71  Resp: 15 15 20 16   Temp: 98.2 F (36.8 C) 98.9 F (37.2 C) 99.1 F (37.3 C) 98.6 F (37 C)  TempSrc: Oral Oral Oral Oral  SpO2: 99% 98% 93% 100%  Weight:      Height:        General: Pt is alert, awake, not in acute distress Cardiovascular: RRR, S1/S2 +, no rubs, no gallops Respiratory: CTA bilaterally, no wheezing, no rhonchi Abdominal: Soft, NT, ND, bowel sounds + Extremities: no edema, no cyanosis    The results of significant diagnostics from this hospitalization (including imaging, microbiology, ancillary and laboratory) are listed below for reference.     Microbiology: Recent Results (from the past 240 hour(s))  Resp Panel by RT-PCR (Flu A&B, Covid) Nasopharyngeal Swab     Status: Abnormal   Collection Time: 07/04/20  7:47 PM   Specimen: Nasopharyngeal Swab; Nasopharyngeal(NP) swabs in vial transport medium  Result Value Ref Range Status   SARS Coronavirus 2  by RT PCR POSITIVE (A) NEGATIVE Final    Comment: LACIVITA H. 01.10.21 @ 2307 BY MECIAL J. RESULT CALLED TO, READ BACK BY AND VERIFIED WITH: (NOTE) SARS-CoV-2 target nucleic acids are DETECTED.  The SARS-CoV-2 RNA is generally detectable in upper respiratory specimens during the acute phase of infection. Positive results are indicative of the presence of the identified virus, but do not rule out bacterial infection or co-infection with other pathogens not detected by the test. Clinical correlation with patient history and other diagnostic information is necessary to determine patient infection status. The expected result is Negative.  Fact Sheet for Patients: EntrepreneurPulse.com.au  Fact Sheet for Healthcare Providers: IncredibleEmployment.be  This test is not yet approved or cleared by the [place] FDA and  has been authorized for detection and/or diagnosis of SARS-CoV-2 by FDA under an Emergency Use Authorization (EUA).  This EUA will  remain in effect (meaning this test  can be used) for the duration of  the COVID-19 declaration under Section 564(b)(1) of the Act, 21 U.S.C. section 360bbb-3(b)(1), unless the authorization is terminated or revoked sooner.     Influenza A by PCR NEGATIVE NEGATIVE Final   Influenza B by PCR NEGATIVE NEGATIVE Final    Comment: (NOTE) The Xpert Xpress SARS-CoV-2/FLU/RSV plus assay is intended as an aid in the diagnosis of influenza from Nasopharyngeal swab specimens and should not be used as a sole basis for treatment. Nasal washings and aspirates are unacceptable for Xpert Xpress SARS-CoV-2/FLU/RSV testing.  Fact Sheet for Patients: EntrepreneurPulse.com.au  Fact Sheet for Healthcare Providers: IncredibleEmployment.be  This test is not yet approved or cleared by the [place] FDA and has been authorized for detection and/or diagnosis of SARS-CoV-2 by FDA under  an Emergency Use Authorization (EUA). This EUA will remain in effect (meaning this test can be used) for the duration of the COVID-19 declaration under Section 564(b)(1) of the Act, 21 U.S.C. section 360bbb-3(b)(1), unless the authorization is terminated or revoked.  Performed at [facility], [place] [address], [place], [place] [address]      Labs: BNP (last 3 results) No results for input(s): BNP in the last 8760 hours. Basic Metabolic Panel: Recent Labs  Lab 07/04/20 1731 07/05/20 0400 07/06/20 0318  NA 139 141 146*  K 3.7 3.7 3.9  CL 100 103 107  CO2 28 29 26   GLUCOSE 116* 96 89  BUN 12 14 15   CREATININE 1.32* 1.13* 0.70  CALCIUM 9.6 8.4* 8.2*  MG  --  1.8  --   PHOS  --  5.2*  --    Liver Function Tests: Recent Labs  Lab 07/04/20 1731 07/05/20 0400 07/06/20 0318  AST 32 28 35  ALT 26 23 25   ALKPHOS 79 68 65  BILITOT 0.8 0.6 0.6  PROT 7.3 6.5 6.5  ALBUMIN 4.0 3.5 3.4*   Recent Labs  Lab 07/04/20 1731  LIPASE 29   No results for input(s): AMMONIA in the last 168 hours. CBC: Recent Labs  Lab 07/04/20 1731 07/05/20 0400 07/06/20 0318  WBC 7.1 5.1 4.2  NEUTROABS  --  2.9  --   HGB 12.5 11.6* 12.3  HCT 38.2 36.4 38.5  MCV 93.4 97.6 98.2  PLT 264 221 206   Cardiac Enzymes: No results for input(s): CKTOTAL, CKMB, CKMBINDEX, TROPONINI in the last 168 hours. BNP: Invalid input(s): POCBNP CBG: No results for input(s): GLUCAP in the last 168 hours. D-Dimer No results for input(s): DDIMER in the last 72 hours. Hgb A1c No results for input(s): HGBA1C in the last 72 hours. Lipid Profile No results for input(s): CHOL, HDL, LDLCALC, TRIG, CHOLHDL, LDLDIRECT in the last 72 hours. Thyroid function studies No results for input(s): TSH, T4TOTAL, T3FREE, THYROIDAB in the last 72 hours.  Invalid input(s): FREET3 Anemia work up No results for input(s): VITAMINB12, FOLATE, FERRITIN, TIBC, IRON, RETICCTPCT in the last 72 hours. Urinalysis     Component Value Date/Time   COLORURINE AMBER (A) 07/06/2020 0100   APPEARANCEUR CLOUDY (A) 07/06/2020 0100   LABSPEC >1.046 (H) 07/06/2020 0100   PHURINE 5.0 07/06/2020 0100   GLUCOSEU NEGATIVE 07/06/2020 0100   HGBUR NEGATIVE 07/06/2020 0100   BILIRUBINUR NEGATIVE 07/06/2020 0100   BILIRUBINUR neg 06/06/2015 1201   KETONESUR 5 (A) 07/06/2020 0100   PROTEINUR 30 (A) 07/06/2020 0100   UROBILINOGEN 0.2 06/06/2015 1201   UROBILINOGEN 0.2 11/22/2014 0929  NITRITE NEGATIVE 07/06/2020 0100   LEUKOCYTESUR TRACE (A) 07/06/2020 0100   Sepsis Labs Invalid input(s): PROCALCITONIN,  WBC,  LACTICIDVEN Microbiology Recent Results (from the past 240 hour(s))  Resp Panel by RT-PCR (Flu A&B, Covid) Nasopharyngeal Swab     Status: Abnormal   Collection Time: 07/04/20  7:47 PM   Specimen: Nasopharyngeal Swab; Nasopharyngeal(NP) swabs in vial transport medium  Result Value Ref Range Status   SARS Coronavirus 2 by RT PCR POSITIVE (A) NEGATIVE Final    Comment: LACIVITA H. 01.10.21 @ 2307 BY MECIAL J. RESULT CALLED TO, READ BACK BY AND VERIFIED WITH: (NOTE) SARS-CoV-2 target nucleic acids are DETECTED.  The SARS-CoV-2 RNA is generally detectable in upper respiratory specimens during the acute phase of infection. Positive results are indicative of the presence of the identified virus, but do not rule out bacterial infection or co-infection with other pathogens not detected by the test. Clinical correlation with patient history and other diagnostic information is necessary to determine patient infection status. The expected result is Negative.  Fact Sheet for Patients: EntrepreneurPulse.com.au  Fact Sheet for Healthcare Providers: IncredibleEmployment.be  This test is not yet approved or cleared by the [place] FDA and  has been authorized for detection and/or diagnosis of SARS-CoV-2 by FDA under an Emergency Use Authorization (EUA).  This EUA  will remain in effect (meaning this test  can be used) for the duration of  the COVID-19 declaration under Section 564(b)(1) of the Act, 21 U.S.C. section 360bbb-3(b)(1), unless the authorization is terminated or revoked sooner.     Influenza A by PCR NEGATIVE NEGATIVE Final   Influenza B by PCR NEGATIVE NEGATIVE Final    Comment: (NOTE) The Xpert Xpress SARS-CoV-2/FLU/RSV plus assay is intended as an aid in the diagnosis of influenza from Nasopharyngeal swab specimens and should not be used as a sole basis for treatment. Nasal washings and aspirates are unacceptable for Xpert Xpress SARS-CoV-2/FLU/RSV testing.  Fact Sheet for Patients: EntrepreneurPulse.com.au  Fact Sheet for Healthcare Providers: IncredibleEmployment.be  This test is not yet approved or cleared by the [place] FDA and has been authorized for detection and/or diagnosis of SARS-CoV-2 by FDA under an Emergency Use Authorization (EUA). This EUA will remain in effect (meaning this test can be used) for the duration of the COVID-19 declaration under Section 564(b)(1) of the Act, 21 U.S.C. section 360bbb-3(b)(1), unless the authorization is terminated or revoked.  Performed at [facility], [place] [address] [place], [place] [address]      Time coordinating discharge: Over 30 minutes  SIGNED:   Charlynne Cousins, MD  [facility] 07/08/2020, 8:57 AM Pager   If 7PM-7AM, please contact night-coverage [email or web address] Password [email or web address]

## 2020-07-08 NOTE — Progress Notes (Signed)
Patient refusing to be discharged , Dr Aileen Fass aware, called family and daughter , [name], stated she would come and pick her up when she gets off work at 830 pm.

## 2020-07-08 NOTE — Progress Notes (Signed)
Completed shift assessment, asked pt have she heard from her daughter in regards to picking her up tonight for d/c. Pt stated that she is not going home tonight. Attempted to call daughter, Tameya Kuznia to find out plans for d/c. No answer at this time, did leave voicemail. Will wait for a return call and attempt to call again in a few.

## 2020-07-08 NOTE — Progress Notes (Signed)
Subjective: CC: Doing well this morning. No abdominal pain, n/v this am. Tolerating fld. Having colostomy output.   Objective: Vital signs in last 24 hours: Temp:  [98.6 F (37 C)-99.1 F (37.3 C)] 98.6 F (37 C) (01/14 0530) Pulse Rate:  [69-71] 71 (01/14 0530) Resp:  [16-20] 16 (01/14 0530) BP: (133-135)/(79-80) 133/79 (01/14 0530) SpO2:  [93 %-100 %] 100 % (01/14 0530) Last BM Date: 07/07/20  Intake/Output from previous day: 01/13 0701 - 01/14 0700 In: 240 [P.O.:240] Out: 1500 [Urine:1250; Stool:250] Intake/Output this shift: No intake/output data recorded.  PE: Gen: Awake and alert, NAD Lungs: Normal rate and effort  ESP:QZRAQTM with a large ventral hernia that is soft and appears to be mostly, if not fully reducuble but returns spontaneously. Her abdomen is soft, ND otherwise and non-tender on exam. No peritonitis.+BS. Colostomy bag with some brown, liquid stooland air in bag. No masses or organomegaly  Lab Results:  Recent Labs    07/06/20 0318  WBC 4.2  HGB 12.3  HCT 38.5  PLT 206   BMET Recent Labs    07/06/20 0318  NA 146*  K 3.9  CL 107  CO2 26  GLUCOSE 89  BUN 15  CREATININE 0.70  CALCIUM 8.2*   PT/INR No results for input(s): LABPROT, INR in the last 72 hours. CMP     Component Value Date/Time   NA 146 (H) 07/06/2020 0318   NA 143 09/10/2016 0000   K 3.9 07/06/2020 0318   CL 107 07/06/2020 0318   CO2 26 07/06/2020 0318   GLUCOSE 89 07/06/2020 0318   BUN 15 07/06/2020 0318   BUN 14 09/10/2016 0000   CREATININE 0.70 07/06/2020 0318   CREATININE 0.76 11/19/2017 1024   CALCIUM 8.2 (L) 07/06/2020 0318   PROT 6.5 07/06/2020 0318   ALBUMIN 3.4 (L) 07/06/2020 0318   AST 35 07/06/2020 0318   ALT 25 07/06/2020 0318   ALKPHOS 65 07/06/2020 0318   BILITOT 0.6 07/06/2020 0318   GFRNONAA >60 07/06/2020 0318   GFRNONAA 85 11/19/2017 1024   GFRAA 98 11/19/2017 1024   Lipase     Component Value Date/Time   LIPASE 29 07/04/2020  1731       Studies/Results: DG Abd Portable 1V  Result Date: 07/07/2020 CLINICAL DATA:  Small bowel obstruction. EXAM: PORTABLE ABDOMEN - 1 VIEW COMPARISON:  Plain films the abdomen 07/06/2020 05/10/2020. CT abdomen and pelvis 07/04/2020. FINDINGS: Contrast in the transverse colon seen on the most recent plain film is no longer identified. There is persistent gaseous distention of small bowel loops in the mid abdomen is up to approximately 6 cm, not notably changed. Ostomy in the left abdomen again seen. Severe scoliosis and right hip osteoarthritis noted. IMPRESSION: No change in gaseous distention of small bowel. Contrast in the colon seen on the most recent exam is passed. Questions partial small bowel obstruction. Electronically Signed   By: Inge Rise M.D.   On: 07/07/2020 12:50   DG Abd Portable 1V-Small Bowel Obstruction Protocol-24 hr delay  Result Date: 07/06/2020 CLINICAL DATA:  Small bowel obstruction, 24 hour delayed film. EXAM: PORTABLE ABDOMEN - 1 VIEW COMPARISON:  Radiograph earlier today.  CT yesterday. FINDINGS: Oral contrast previously in the proximal colon has progressed to the distal transverse and proximal descending colon. Mild gaseous distention of bowel loops in the lower abdomen persist. There is excreted IV contrast in the urinary bladder, less dense than earlier today. Severe scoliotic curvature of the  spine. Advanced right hip osteoarthritis. IMPRESSION: Oral contrast again seen in the colon, contrast previously in the proximal colon has progressed to the distal transverse and proximal descending colon. Mild gaseous distention of small bowel loops in the lower abdomen persist. Electronically Signed   By: Keith Rake M.D.   On: 07/06/2020 17:27    Anti-infectives: Anti-infectives (From admission, onward)   None       Assessment/Plan Hx of Chronic diastolic CHF Atrial flutter status post ablation not on anticoagulation Hypertension Marfan syndrome RA  on methotrexate Blind COVID-19 + Hx ofexploratory laparotomy with a descending colostomy by Dr. Lilyan Punt on 11/27/2012 for perforated diverticular disease  SBO  Ventral Hernia -Her hernia is fully, if not, mostly reducible but returns spontaneously -Clinically and radiographically resolving SBO. Tolerating FLD. Adv to soft diet.  - If tolerates soft diet, can be d/c'd from our standpoint  - Keep K > 4and Mg > 80for bowel function - Mobilize as able for bowel function  FEN -Soft  VTE -SCDs,Lovenox ID -None   LOS: 4 days    Jillyn Ledger , [facility] 07/08/2020, 9:26 AM Please see Amion for pager number during day hours 7:00am-4:30pm

## 2020-07-09 DIAGNOSIS — I5032 Chronic diastolic (congestive) heart failure: Secondary | ICD-10-CM | POA: Diagnosis not present

## 2020-07-09 DIAGNOSIS — K56600 Partial intestinal obstruction, unspecified as to cause: Secondary | ICD-10-CM | POA: Diagnosis not present

## 2020-07-09 DIAGNOSIS — N179 Acute kidney failure, unspecified: Secondary | ICD-10-CM | POA: Diagnosis not present

## 2020-07-09 NOTE — Progress Notes (Signed)
[facility] PROGRESS NOTE    Progress Note  [name]  ONG:[id number] DOB: 08-Apr-1956 DOA: 07/04/2020 PCP: [name], MD     Brief Narrative:   [name] is an 65 y.o. female past medical history of perforated diverticulitis status post diverting colostomy in 2014, large ventral hernia recurrent SBO, chronic diastolic heart failure, atrial flutter status post ablation not on anticoagulation Marfan syndrome, rheumatoid arthritis comes into the ED for evaluation of abdominal pain vomiting and decreased output through the ostomy.  Assessment/Plan:   Partial small bowel obstruction ([place]) This resolved with conservative management. The patient was discharged on 07/08/2020 and the family did not come and pick her up. She is stable for discharge this morning on 07/10/2019.  Incidental SARS-CoV-2 PCR positive: She is currently asymptomatic not requiring oxygen and has remained afebrile. This was an incidental finding she has remained asymptomatic satting greater than 94% on room air.  Acute kidney injury: Mild mild hyponatremia: Essential hypertension: History of rheumatoid arthritis: History of atrial flutter status post ablation 2015:  DVT prophylaxis: lovenox Family Communication:noen Status is: Inpatient  Remains inpatient appropriate because:Hemodynamically unstable   Dispo: The patient is from: Home              Anticipated d/c is to: Home              Anticipated d/c date is: 1 day              Patient currently is not medically stable to d/c.  Hopefully home tomorrow morning.        Code Status:     Code Status Orders  (From admission, onward)         Start     Ordered   07/04/20 2256  Full code  Continuous        07/04/20 2258        Code Status History    Date Active Date Inactive Code Status Order ID Comments User Context   05/07/2020 1347 05/13/2020 0121 Full Code [id number]  Harold Hedge, MD ED   08/07/2016 2133 08/08/2016 2252  Full Code [id number]  Edwin Dada, MD Inpatient   06/07/2015 0152 06/14/2015 2156 Full Code [id number]  [name], MD Inpatient   11/22/2014 1155 11/27/2014 2251 Full Code [id number]  Elgergawy, Silver Huguenin, MD Inpatient   05/01/2014 1619 05/11/2014 2159 Full Code [id number]  Oswald Hillock, MD Inpatient   01/04/2014 1849 01/07/2014 1450 Full Code [id number]  Evans Lance, MD Inpatient   12/31/2013 1835 01/04/2014 1849 Full Code [id number]  Sheila Oats, MD Inpatient   02/26/2013 0257 02/28/2013 2254 Full Code [id number]  Excell Seltzer, MD Inpatient   Advance Care Planning Activity        IV Access:    Peripheral IV   Procedures and diagnostic studies:   DG Abd Portable 1V  Result Date: 07/07/2020 CLINICAL DATA:  Small bowel obstruction. EXAM: PORTABLE ABDOMEN - 1 VIEW COMPARISON:  Plain films the abdomen 07/06/2020 05/10/2020. CT abdomen and pelvis 07/04/2020. FINDINGS: Contrast in the transverse colon seen on the most recent plain film is no longer identified. There is persistent gaseous distention of small bowel loops in the mid abdomen is up to approximately 6 cm, not notably changed. Ostomy in the left abdomen again seen. Severe scoliosis and right hip osteoarthritis noted. IMPRESSION: No change in gaseous distention of small bowel. Contrast in the colon seen on the most recent exam is passed. Questions partial  small bowel obstruction. Electronically Signed   By: Inge Rise M.D.   On: 07/07/2020 12:50     Medical Consultants:    None.  Anti-Infectives:   none  Subjective:    [name] she denies any abdominal pain.  Tolerating her diet.  Objective:    Vitals:   07/08/20 0530 07/08/20 1401 07/08/20 2114 07/09/20 0514  BP: 133/79 117/81 122/71 (!) 141/83  Pulse: 71 75 63 70  Resp: 16 16 16 16   Temp: 98.6 F (37 C) 98.9 F (37.2 C) 98 F (36.7 C) 98.5 F (36.9 C)  TempSrc: Oral Oral Oral   SpO2: 100% 97% 98% 100%  Weight:      Height:        SpO2: 100 %   Intake/Output Summary (Last 24 hours) at 07/09/2020 0846 Last data filed at 07/09/2020 0635 Gross per 24 hour  Intake 1560 ml  Output 1675 ml  Net -115 ml   Filed Weights   07/05/20 1935  Weight: 68.5 kg    Exam: General exam: In no acute distress. Respiratory system: Good air movement and clear to auscultation. Cardiovascular system: S1 & S2 heard, RRR. No JVD. Gastrointestinal system: Abdomen is nondistended, soft and nontender.  Extremities: No pedal edema. Skin: No rashes, lesions or ulcers Psychiatry: Judgement and insight appear normal. Mood & affect appropriate..    Data Reviewed:    Labs: Basic Metabolic Panel: Recent Labs  Lab 07/04/20 1731 07/05/20 0400 07/06/20 0318  NA 139 141 146*  K 3.7 3.7 3.9  CL 100 103 107  CO2 28 29 26   GLUCOSE 116* 96 89  BUN 12 14 15   CREATININE 1.32* 1.13* 0.70  CALCIUM 9.6 8.4* 8.2*  MG  --  1.8  --   PHOS  --  5.2*  --    GFR Estimated Creatinine Clearance: 71.7 mL/min (by C-G formula based on SCr of 0.7 mg/dL). Liver Function Tests: Recent Labs  Lab 07/04/20 1731 07/05/20 0400 07/06/20 0318  AST 32 28 35  ALT 26 23 25   ALKPHOS 79 68 65  BILITOT 0.8 0.6 0.6  PROT 7.3 6.5 6.5  ALBUMIN 4.0 3.5 3.4*   Recent Labs  Lab 07/04/20 1731  LIPASE 29   No results for input(s): AMMONIA in the last 168 hours. Coagulation profile No results for input(s): INR, PROTIME in the last 168 hours. COVID-19 Labs  No results for input(s): DDIMER, FERRITIN, LDH, CRP in the last 72 hours.  Lab Results  Component Value Date   SARSCOV2NAA POSITIVE (A) 07/04/2020   [place] NEGATIVE 05/07/2020    CBC: Recent Labs  Lab 07/04/20 1731 07/05/20 0400 07/06/20 0318  WBC 7.1 5.1 4.2  NEUTROABS  --  2.9  --   HGB 12.5 11.6* 12.3  HCT 38.2 36.4 38.5  MCV 93.4 97.6 98.2  PLT 264 221 206   Cardiac Enzymes: No results for input(s): CKTOTAL, CKMB, CKMBINDEX, TROPONINI in the last 168 hours. BNP (last 3  results) No results for input(s): PROBNP in the last 8760 hours. CBG: No results for input(s): GLUCAP in the last 168 hours. D-Dimer: No results for input(s): DDIMER in the last 72 hours. Hgb A1c: No results for input(s): HGBA1C in the last 72 hours. Lipid Profile: No results for input(s): CHOL, HDL, LDLCALC, TRIG, CHOLHDL, LDLDIRECT in the last 72 hours. Thyroid function studies: No results for input(s): TSH, T4TOTAL, T3FREE, THYROIDAB in the last 72 hours.  Invalid input(s): FREET3 Anemia work up: No results for  input(s): VITAMINB12, FOLATE, FERRITIN, TIBC, IRON, RETICCTPCT in the last 72 hours. Sepsis Labs: Recent Labs  Lab 07/04/20 1731 07/05/20 0400 07/06/20 0318  WBC 7.1 5.1 4.2   Microbiology Recent Results (from the past 240 hour(s))  Resp Panel by RT-PCR (Flu A&B, Covid) Nasopharyngeal Swab     Status: Abnormal   Collection Time: 07/04/20  7:47 PM   Specimen: Nasopharyngeal Swab; Nasopharyngeal(NP) swabs in vial transport medium  Result Value Ref Range Status   SARS Coronavirus 2 by RT PCR POSITIVE (A) NEGATIVE Final    Comment: LACIVITA H. 01.10.21 @ 2307 BY MECIAL J. RESULT CALLED TO, READ BACK BY AND VERIFIED WITH: (NOTE) SARS-CoV-2 target nucleic acids are DETECTED.  The SARS-CoV-2 RNA is generally detectable in upper respiratory specimens during the acute phase of infection. Positive results are indicative of the presence of the identified virus, but do not rule out bacterial infection or co-infection with other pathogens not detected by the test. Clinical correlation with patient history and other diagnostic information is necessary to determine patient infection status. The expected result is Negative.  Fact Sheet for Patients: EntrepreneurPulse.com.au  Fact Sheet for Healthcare Providers: IncredibleEmployment.be  This test is not yet approved or cleared by the [place] FDA and  has been authorized for  detection and/or diagnosis of SARS-CoV-2 by FDA under an Emergency Use Authorization (EUA).  This EUA will remain in effect (meaning this test  can be used) for the duration of  the COVID-19 declaration under Section 564(b)(1) of the Act, 21 U.S.C. section 360bbb-3(b)(1), unless the authorization is terminated or revoked sooner.     Influenza A by PCR NEGATIVE NEGATIVE Final   Influenza B by PCR NEGATIVE NEGATIVE Final    Comment: (NOTE) The Xpert Xpress SARS-CoV-2/FLU/RSV plus assay is intended as an aid in the diagnosis of influenza from Nasopharyngeal swab specimens and should not be used as a sole basis for treatment. Nasal washings and aspirates are unacceptable for Xpert Xpress SARS-CoV-2/FLU/RSV testing.  Fact Sheet for Patients: EntrepreneurPulse.com.au  Fact Sheet for Healthcare Providers: IncredibleEmployment.be  This test is not yet approved or cleared by the [place] FDA and has been authorized for detection and/or diagnosis of SARS-CoV-2 by FDA under an Emergency Use Authorization (EUA). This EUA will remain in effect (meaning this test can be used) for the duration of the COVID-19 declaration under Section 564(b)(1) of the Act, 21 U.S.C. section 360bbb-3(b)(1), unless the authorization is terminated or revoked.  Performed at [facility], [place] [address], [place], [place] [address]      Medications:   . enoxaparin (LOVENOX) injection  40 mg Subcutaneous Q24H   Continuous Infusions:    LOS: 5 days   Charlynne Cousins  [facility]  07/09/2020, 8:46 AM

## 2020-07-09 NOTE — Progress Notes (Signed)
Nurse reviewed discharge instructions with pt.  Pt verbalized understanding of discharge instructions and medications.    Nurse spoke with pt's daughter and daughter stated she was on her way to pick up pt.  No concerns at time of discharge.

## 2020-07-12 ENCOUNTER — Telehealth: Payer: Medicare Other | Admitting: Family Medicine

## 2020-07-13 ENCOUNTER — Telehealth: Payer: Medicare Other | Admitting: Family Medicine

## 2020-07-14 ENCOUNTER — Telehealth (INDEPENDENT_AMBULATORY_CARE_PROVIDER_SITE_OTHER): Payer: Medicare Other | Admitting: Family Medicine

## 2020-07-14 ENCOUNTER — Encounter: Payer: Self-pay | Admitting: Family Medicine

## 2020-07-14 DIAGNOSIS — Z8673 Personal history of transient ischemic attack (TIA), and cerebral infarction without residual deficits: Secondary | ICD-10-CM | POA: Diagnosis not present

## 2020-07-14 DIAGNOSIS — K565 Intestinal adhesions [bands], unspecified as to partial versus complete obstruction: Secondary | ICD-10-CM | POA: Diagnosis not present

## 2020-07-14 DIAGNOSIS — Z86010 Personal history of colonic polyps: Secondary | ICD-10-CM | POA: Diagnosis not present

## 2020-07-14 DIAGNOSIS — I4891 Unspecified atrial fibrillation: Secondary | ICD-10-CM | POA: Diagnosis not present

## 2020-07-14 DIAGNOSIS — U071 COVID-19: Secondary | ICD-10-CM

## 2020-07-14 DIAGNOSIS — Z85528 Personal history of other malignant neoplasm of kidney: Secondary | ICD-10-CM | POA: Diagnosis not present

## 2020-07-14 DIAGNOSIS — E440 Moderate protein-calorie malnutrition: Secondary | ICD-10-CM | POA: Diagnosis not present

## 2020-07-14 DIAGNOSIS — M810 Age-related osteoporosis without current pathological fracture: Secondary | ICD-10-CM | POA: Diagnosis not present

## 2020-07-14 DIAGNOSIS — Z7982 Long term (current) use of aspirin: Secondary | ICD-10-CM | POA: Diagnosis not present

## 2020-07-14 DIAGNOSIS — K5909 Other constipation: Secondary | ICD-10-CM | POA: Diagnosis not present

## 2020-07-14 DIAGNOSIS — I700 Atherosclerosis of aorta: Secondary | ICD-10-CM | POA: Diagnosis not present

## 2020-07-14 DIAGNOSIS — M160 Bilateral primary osteoarthritis of hip: Secondary | ICD-10-CM | POA: Diagnosis not present

## 2020-07-14 DIAGNOSIS — K56609 Unspecified intestinal obstruction, unspecified as to partial versus complete obstruction: Secondary | ICD-10-CM

## 2020-07-14 DIAGNOSIS — M1A9XX0 Chronic gout, unspecified, without tophus (tophi): Secondary | ICD-10-CM | POA: Diagnosis not present

## 2020-07-14 DIAGNOSIS — K047 Periapical abscess without sinus: Secondary | ICD-10-CM

## 2020-07-14 DIAGNOSIS — I110 Hypertensive heart disease with heart failure: Secondary | ICD-10-CM | POA: Diagnosis not present

## 2020-07-14 DIAGNOSIS — M419 Scoliosis, unspecified: Secondary | ICD-10-CM | POA: Diagnosis not present

## 2020-07-14 DIAGNOSIS — H548 Legal blindness, as defined in USA: Secondary | ICD-10-CM | POA: Diagnosis not present

## 2020-07-14 DIAGNOSIS — K5730 Diverticulosis of large intestine without perforation or abscess without bleeding: Secondary | ICD-10-CM | POA: Diagnosis not present

## 2020-07-14 DIAGNOSIS — J45909 Unspecified asthma, uncomplicated: Secondary | ICD-10-CM | POA: Diagnosis not present

## 2020-07-14 DIAGNOSIS — I5042 Chronic combined systolic (congestive) and diastolic (congestive) heart failure: Secondary | ICD-10-CM | POA: Diagnosis not present

## 2020-07-14 DIAGNOSIS — I4892 Unspecified atrial flutter: Secondary | ICD-10-CM | POA: Diagnosis not present

## 2020-07-14 DIAGNOSIS — M069 Rheumatoid arthritis, unspecified: Secondary | ICD-10-CM | POA: Diagnosis not present

## 2020-07-14 DIAGNOSIS — Z933 Colostomy status: Secondary | ICD-10-CM | POA: Diagnosis not present

## 2020-07-14 DIAGNOSIS — M47816 Spondylosis without myelopathy or radiculopathy, lumbar region: Secondary | ICD-10-CM | POA: Diagnosis not present

## 2020-07-14 DIAGNOSIS — K439 Ventral hernia without obstruction or gangrene: Secondary | ICD-10-CM | POA: Diagnosis not present

## 2020-07-14 DIAGNOSIS — K219 Gastro-esophageal reflux disease without esophagitis: Secondary | ICD-10-CM | POA: Diagnosis not present

## 2020-07-14 MED ORDER — HYDROCODONE-HOMATROPINE 5-1.5 MG/5ML PO SYRP
5.0000 mL | ORAL_SOLUTION | ORAL | 0 refills | Status: DC | PRN
Start: 2020-07-14 — End: 2020-10-21

## 2020-07-14 MED ORDER — AMOXICILLIN 500 MG PO CAPS: 500.0000 mg | ORAL_CAPSULE | Freq: Three times a day (TID) | ORAL | 0 refills | Status: DC

## 2020-07-14 NOTE — Progress Notes (Signed)
   Subjective:    Patient ID: [name], female    DOB: 01-Apr-1956, 65 y.o.   MRN: [id number]  HPI Virtual Visit via Telephone Note  I connected with the patient on 07/14/20 at  9:00 AM EST by telephone and verified that I am speaking with the correct person using two identifiers.   I discussed the limitations, risks, security and privacy concerns of performing an evaluation and management service by telephone and the availability of in person appointments. I also discussed with the patient that there may be a patient responsible charge related to this service. The patient expressed understanding and agreed to proceed.  Location patient: home Location provider: work or home office Participants present for the call: patient, provider Patient did not have a visit in the prior 7 days to address this/these issue(s).   History of Present Illness: Here for several issues. First she was hospitalized from 07-04-20 to 07-08-20 for another SBO after she presented with vomiting, abdominal pain, and decreased stool output through her ostomy. This was corrected with bowel rest and IV fluids. Then just before her DC she developed a fever and a cough. She tested positive for the Covid-19 virus. Her daughter and home health aide tested positive as well. Now she feels better but is still coughing. No chest pain or SOB. Lastly we treated her for a dental abscess in the left lower jaw in December. She has been unable to see her dentist because of the health issues she has been having. She is now scheduled to see her dentist next week, but her jaw is swollen and painful again.    Observations/Objective: Patient sounds cheerful and well on the phone. I do not appreciate any SOB. Speech and thought processing are grossly intact. Patient reported vitals:  Assessment and Plan: The SBO has apparently resolved. She is getting over a Covid infection. We will treat the dental abscess with 10 days of Amoxicillin.   [name], MD   Follow Up Instructions:     [phone] 5-10 [phone] 11-20 [phone] 21-30 I did not refer this patient for an OV in the next 24 hours for this/these issue(s).  I discussed the assessment and treatment plan with the patient. The patient was provided an opportunity to ask questions and all were answered. The patient agreed with the plan and demonstrated an understanding of the instructions.   The patient was advised to call back or seek an in-person evaluation if the symptoms worsen or if the condition fails to improve as anticipated.  I provided 21 minutes of non-face-to-face time during this encounter.   [name], MD    Review of Systems     Objective:   Physical Exam        Assessment & Plan:

## 2020-07-21 DIAGNOSIS — M19041 Primary osteoarthritis, right hand: Secondary | ICD-10-CM | POA: Diagnosis not present

## 2020-07-21 DIAGNOSIS — U071 COVID-19: Secondary | ICD-10-CM | POA: Diagnosis not present

## 2020-07-21 DIAGNOSIS — M810 Age-related osteoporosis without current pathological fracture: Secondary | ICD-10-CM | POA: Diagnosis not present

## 2020-07-21 DIAGNOSIS — K5651 Intestinal adhesions [bands], with partial obstruction: Secondary | ICD-10-CM | POA: Diagnosis not present

## 2020-07-21 DIAGNOSIS — M47816 Spondylosis without myelopathy or radiculopathy, lumbar region: Secondary | ICD-10-CM | POA: Diagnosis not present

## 2020-07-21 DIAGNOSIS — I4891 Unspecified atrial fibrillation: Secondary | ICD-10-CM | POA: Diagnosis not present

## 2020-07-21 DIAGNOSIS — K5909 Other constipation: Secondary | ICD-10-CM | POA: Diagnosis not present

## 2020-07-21 DIAGNOSIS — H548 Legal blindness, as defined in USA: Secondary | ICD-10-CM | POA: Diagnosis not present

## 2020-07-21 DIAGNOSIS — K5730 Diverticulosis of large intestine without perforation or abscess without bleeding: Secondary | ICD-10-CM | POA: Diagnosis not present

## 2020-07-21 DIAGNOSIS — M160 Bilateral primary osteoarthritis of hip: Secondary | ICD-10-CM | POA: Diagnosis not present

## 2020-07-21 DIAGNOSIS — M4186 Other forms of scoliosis, lumbar region: Secondary | ICD-10-CM | POA: Diagnosis not present

## 2020-07-21 DIAGNOSIS — M069 Rheumatoid arthritis, unspecified: Secondary | ICD-10-CM | POA: Diagnosis not present

## 2020-07-21 DIAGNOSIS — H9190 Unspecified hearing loss, unspecified ear: Secondary | ICD-10-CM | POA: Diagnosis not present

## 2020-07-21 DIAGNOSIS — K219 Gastro-esophageal reflux disease without esophagitis: Secondary | ICD-10-CM | POA: Diagnosis not present

## 2020-07-21 DIAGNOSIS — I4892 Unspecified atrial flutter: Secondary | ICD-10-CM | POA: Diagnosis not present

## 2020-07-21 DIAGNOSIS — N179 Acute kidney failure, unspecified: Secondary | ICD-10-CM | POA: Diagnosis not present

## 2020-07-21 DIAGNOSIS — J45909 Unspecified asthma, uncomplicated: Secondary | ICD-10-CM | POA: Diagnosis not present

## 2020-07-21 DIAGNOSIS — I700 Atherosclerosis of aorta: Secondary | ICD-10-CM | POA: Diagnosis not present

## 2020-07-21 DIAGNOSIS — E440 Moderate protein-calorie malnutrition: Secondary | ICD-10-CM | POA: Diagnosis not present

## 2020-07-21 DIAGNOSIS — M19042 Primary osteoarthritis, left hand: Secondary | ICD-10-CM | POA: Diagnosis not present

## 2020-07-21 DIAGNOSIS — K439 Ventral hernia without obstruction or gangrene: Secondary | ICD-10-CM | POA: Diagnosis not present

## 2020-07-21 DIAGNOSIS — I110 Hypertensive heart disease with heart failure: Secondary | ICD-10-CM | POA: Diagnosis not present

## 2020-07-21 DIAGNOSIS — I5042 Chronic combined systolic (congestive) and diastolic (congestive) heart failure: Secondary | ICD-10-CM | POA: Diagnosis not present

## 2020-07-21 DIAGNOSIS — M1A9XX0 Chronic gout, unspecified, without tophus (tophi): Secondary | ICD-10-CM | POA: Diagnosis not present

## 2020-07-22 ENCOUNTER — Encounter: Payer: Self-pay | Admitting: Family Medicine

## 2020-07-22 ENCOUNTER — Telehealth (INDEPENDENT_AMBULATORY_CARE_PROVIDER_SITE_OTHER): Payer: Medicare Other | Admitting: Family Medicine

## 2020-07-22 DIAGNOSIS — K219 Gastro-esophageal reflux disease without esophagitis: Secondary | ICD-10-CM | POA: Diagnosis not present

## 2020-07-22 DIAGNOSIS — M19041 Primary osteoarthritis, right hand: Secondary | ICD-10-CM | POA: Diagnosis not present

## 2020-07-22 DIAGNOSIS — F1190 Opioid use, unspecified, uncomplicated: Secondary | ICD-10-CM | POA: Diagnosis not present

## 2020-07-22 DIAGNOSIS — M47816 Spondylosis without myelopathy or radiculopathy, lumbar region: Secondary | ICD-10-CM | POA: Diagnosis not present

## 2020-07-22 DIAGNOSIS — U071 COVID-19: Secondary | ICD-10-CM | POA: Diagnosis not present

## 2020-07-22 DIAGNOSIS — I5042 Chronic combined systolic (congestive) and diastolic (congestive) heart failure: Secondary | ICD-10-CM | POA: Diagnosis not present

## 2020-07-22 DIAGNOSIS — K439 Ventral hernia without obstruction or gangrene: Secondary | ICD-10-CM | POA: Diagnosis not present

## 2020-07-22 DIAGNOSIS — M160 Bilateral primary osteoarthritis of hip: Secondary | ICD-10-CM | POA: Diagnosis not present

## 2020-07-22 DIAGNOSIS — M19042 Primary osteoarthritis, left hand: Secondary | ICD-10-CM | POA: Diagnosis not present

## 2020-07-22 DIAGNOSIS — H548 Legal blindness, as defined in USA: Secondary | ICD-10-CM | POA: Diagnosis not present

## 2020-07-22 DIAGNOSIS — J45909 Unspecified asthma, uncomplicated: Secondary | ICD-10-CM | POA: Diagnosis not present

## 2020-07-22 DIAGNOSIS — M069 Rheumatoid arthritis, unspecified: Secondary | ICD-10-CM | POA: Diagnosis not present

## 2020-07-22 DIAGNOSIS — M810 Age-related osteoporosis without current pathological fracture: Secondary | ICD-10-CM | POA: Diagnosis not present

## 2020-07-22 DIAGNOSIS — K5730 Diverticulosis of large intestine without perforation or abscess without bleeding: Secondary | ICD-10-CM | POA: Diagnosis not present

## 2020-07-22 DIAGNOSIS — I4891 Unspecified atrial fibrillation: Secondary | ICD-10-CM | POA: Diagnosis not present

## 2020-07-22 DIAGNOSIS — I700 Atherosclerosis of aorta: Secondary | ICD-10-CM | POA: Diagnosis not present

## 2020-07-22 DIAGNOSIS — N179 Acute kidney failure, unspecified: Secondary | ICD-10-CM | POA: Diagnosis not present

## 2020-07-22 DIAGNOSIS — H9190 Unspecified hearing loss, unspecified ear: Secondary | ICD-10-CM | POA: Diagnosis not present

## 2020-07-22 DIAGNOSIS — M1A9XX0 Chronic gout, unspecified, without tophus (tophi): Secondary | ICD-10-CM | POA: Diagnosis not present

## 2020-07-22 DIAGNOSIS — K5651 Intestinal adhesions [bands], with partial obstruction: Secondary | ICD-10-CM | POA: Diagnosis not present

## 2020-07-22 DIAGNOSIS — I110 Hypertensive heart disease with heart failure: Secondary | ICD-10-CM | POA: Diagnosis not present

## 2020-07-22 DIAGNOSIS — I4892 Unspecified atrial flutter: Secondary | ICD-10-CM | POA: Diagnosis not present

## 2020-07-22 DIAGNOSIS — M4186 Other forms of scoliosis, lumbar region: Secondary | ICD-10-CM | POA: Diagnosis not present

## 2020-07-22 DIAGNOSIS — E440 Moderate protein-calorie malnutrition: Secondary | ICD-10-CM | POA: Diagnosis not present

## 2020-07-22 DIAGNOSIS — K5909 Other constipation: Secondary | ICD-10-CM | POA: Diagnosis not present

## 2020-07-22 MED ORDER — HYDROCODONE-ACETAMINOPHEN 10-325 MG PO TABS: 1.0000 | ORAL_TABLET | Freq: Four times a day (QID) | ORAL | 0 refills | Status: DC | PRN

## 2020-07-22 MED ORDER — HYDROCODONE-ACETAMINOPHEN 10-325 MG PO TABS: 1.0000 | ORAL_TABLET | Freq: Four times a day (QID) | ORAL | 0 refills | Status: AC | PRN

## 2020-07-22 MED ORDER — DIAZEPAM 2 MG PO TABS: 2.0000 mg | ORAL_TABLET | Freq: Two times a day (BID) | ORAL | 5 refills | Status: DC | PRN

## 2020-07-22 NOTE — Progress Notes (Signed)
   Subjective:    Patient ID: [name], female    DOB: [birth year], 66 y.o.   MRN: [id number]  HPI Virtual Visit via Telephone Note  I connected with the patient on 07/22/20 at  2:00 PM EST by telephone and verified that I am speaking with the correct person using two identifiers.   I discussed the limitations, risks, security and privacy concerns of performing an evaluation and management service by telephone and the availability of in person appointments. I also discussed with the patient that there may be a patient responsible charge related to this service. The patient expressed understanding and agreed to proceed.  Location patient: home Location provider: work or home office Participants present for the call: patient, provider Patient did not have a visit in the prior 7 days to address this/these issue(s).   History of Present Illness: Here for pain management, she is doing well. Indication for chronic opioid: RA Medication and dose: Norco 10-325 # pills per month: 120 Last UDS date: 06-21-20 Opioid Treatment Agreement signed (Y/N): 07-12-17 Opioid Treatment Agreement last reviewed with patient:  07-22-20 NCCSRS reviewed this encounter (include red flags): Yes     Observations/Objective: Patient sounds cheerful and well on the phone. I do not appreciate any SOB. Speech and thought processing are grossly intact. Patient reported vitals:  Assessment and Plan: Pain management, meds were refilled. [name], MD  Follow Up Instructions:     [phone] 5-10 [phone] 11-20 [phone] 21-30 I did not refer this patient for an OV in the next 24 hours for this/these issue(s).  I discussed the assessment and treatment plan with the patient. The patient was provided an opportunity to ask questions and all were answered. The patient agreed with the plan and demonstrated an understanding of the instructions.   The patient was advised to call back or seek an in-person evaluation if  the symptoms worsen or if the condition fails to improve as anticipated.  I provided 15 minutes of non-face-to-face time during this encounter.   [name], MD    Review of Systems     Objective:   Physical Exam        Assessment & Plan:

## 2020-07-25 DIAGNOSIS — Z933 Colostomy status: Secondary | ICD-10-CM | POA: Diagnosis not present

## 2020-07-26 DIAGNOSIS — I4891 Unspecified atrial fibrillation: Secondary | ICD-10-CM | POA: Diagnosis not present

## 2020-07-26 DIAGNOSIS — H9190 Unspecified hearing loss, unspecified ear: Secondary | ICD-10-CM | POA: Diagnosis not present

## 2020-07-26 DIAGNOSIS — M069 Rheumatoid arthritis, unspecified: Secondary | ICD-10-CM | POA: Diagnosis not present

## 2020-07-26 DIAGNOSIS — I5042 Chronic combined systolic (congestive) and diastolic (congestive) heart failure: Secondary | ICD-10-CM | POA: Diagnosis not present

## 2020-07-26 DIAGNOSIS — U071 COVID-19: Secondary | ICD-10-CM | POA: Diagnosis not present

## 2020-07-26 DIAGNOSIS — K439 Ventral hernia without obstruction or gangrene: Secondary | ICD-10-CM | POA: Diagnosis not present

## 2020-07-26 DIAGNOSIS — I110 Hypertensive heart disease with heart failure: Secondary | ICD-10-CM | POA: Diagnosis not present

## 2020-07-26 DIAGNOSIS — I700 Atherosclerosis of aorta: Secondary | ICD-10-CM | POA: Diagnosis not present

## 2020-07-26 DIAGNOSIS — E440 Moderate protein-calorie malnutrition: Secondary | ICD-10-CM | POA: Diagnosis not present

## 2020-07-26 DIAGNOSIS — M1A9XX0 Chronic gout, unspecified, without tophus (tophi): Secondary | ICD-10-CM | POA: Diagnosis not present

## 2020-07-26 DIAGNOSIS — K5730 Diverticulosis of large intestine without perforation or abscess without bleeding: Secondary | ICD-10-CM | POA: Diagnosis not present

## 2020-07-26 DIAGNOSIS — J45909 Unspecified asthma, uncomplicated: Secondary | ICD-10-CM | POA: Diagnosis not present

## 2020-07-26 DIAGNOSIS — M810 Age-related osteoporosis without current pathological fracture: Secondary | ICD-10-CM | POA: Diagnosis not present

## 2020-07-26 DIAGNOSIS — H548 Legal blindness, as defined in USA: Secondary | ICD-10-CM | POA: Diagnosis not present

## 2020-07-26 DIAGNOSIS — M160 Bilateral primary osteoarthritis of hip: Secondary | ICD-10-CM | POA: Diagnosis not present

## 2020-07-26 DIAGNOSIS — I4892 Unspecified atrial flutter: Secondary | ICD-10-CM | POA: Diagnosis not present

## 2020-07-26 DIAGNOSIS — M47816 Spondylosis without myelopathy or radiculopathy, lumbar region: Secondary | ICD-10-CM | POA: Diagnosis not present

## 2020-07-26 DIAGNOSIS — M19042 Primary osteoarthritis, left hand: Secondary | ICD-10-CM | POA: Diagnosis not present

## 2020-07-26 DIAGNOSIS — M4186 Other forms of scoliosis, lumbar region: Secondary | ICD-10-CM | POA: Diagnosis not present

## 2020-07-26 DIAGNOSIS — N179 Acute kidney failure, unspecified: Secondary | ICD-10-CM | POA: Diagnosis not present

## 2020-07-26 DIAGNOSIS — M19041 Primary osteoarthritis, right hand: Secondary | ICD-10-CM | POA: Diagnosis not present

## 2020-07-26 DIAGNOSIS — K219 Gastro-esophageal reflux disease without esophagitis: Secondary | ICD-10-CM | POA: Diagnosis not present

## 2020-07-26 DIAGNOSIS — K5909 Other constipation: Secondary | ICD-10-CM | POA: Diagnosis not present

## 2020-07-26 DIAGNOSIS — K5651 Intestinal adhesions [bands], with partial obstruction: Secondary | ICD-10-CM | POA: Diagnosis not present

## 2020-07-27 DIAGNOSIS — Z8673 Personal history of transient ischemic attack (TIA), and cerebral infarction without residual deficits: Secondary | ICD-10-CM

## 2020-07-27 DIAGNOSIS — E440 Moderate protein-calorie malnutrition: Secondary | ICD-10-CM

## 2020-07-27 DIAGNOSIS — K219 Gastro-esophageal reflux disease without esophagitis: Secondary | ICD-10-CM

## 2020-07-27 DIAGNOSIS — K5730 Diverticulosis of large intestine without perforation or abscess without bleeding: Secondary | ICD-10-CM

## 2020-07-27 DIAGNOSIS — Q8740 Marfan's syndrome, unspecified: Secondary | ICD-10-CM

## 2020-07-27 DIAGNOSIS — Z96653 Presence of artificial knee joint, bilateral: Secondary | ICD-10-CM

## 2020-07-27 DIAGNOSIS — K5651 Intestinal adhesions [bands], with partial obstruction: Secondary | ICD-10-CM | POA: Diagnosis not present

## 2020-07-27 DIAGNOSIS — I4891 Unspecified atrial fibrillation: Secondary | ICD-10-CM

## 2020-07-27 DIAGNOSIS — M1A9XX0 Chronic gout, unspecified, without tophus (tophi): Secondary | ICD-10-CM

## 2020-07-27 DIAGNOSIS — I5042 Chronic combined systolic (congestive) and diastolic (congestive) heart failure: Secondary | ICD-10-CM | POA: Diagnosis not present

## 2020-07-27 DIAGNOSIS — N179 Acute kidney failure, unspecified: Secondary | ICD-10-CM

## 2020-07-27 DIAGNOSIS — H9190 Unspecified hearing loss, unspecified ear: Secondary | ICD-10-CM

## 2020-07-27 DIAGNOSIS — M069 Rheumatoid arthritis, unspecified: Secondary | ICD-10-CM

## 2020-07-27 DIAGNOSIS — M160 Bilateral primary osteoarthritis of hip: Secondary | ICD-10-CM

## 2020-07-27 DIAGNOSIS — Z85528 Personal history of other malignant neoplasm of kidney: Secondary | ICD-10-CM

## 2020-07-27 DIAGNOSIS — M6249 Contracture of muscle, multiple sites: Secondary | ICD-10-CM

## 2020-07-27 DIAGNOSIS — Z79899 Other long term (current) drug therapy: Secondary | ICD-10-CM

## 2020-07-27 DIAGNOSIS — J45909 Unspecified asthma, uncomplicated: Secondary | ICD-10-CM

## 2020-07-27 DIAGNOSIS — I700 Atherosclerosis of aorta: Secondary | ICD-10-CM

## 2020-07-27 DIAGNOSIS — Z9181 History of falling: Secondary | ICD-10-CM

## 2020-07-27 DIAGNOSIS — I110 Hypertensive heart disease with heart failure: Secondary | ICD-10-CM | POA: Diagnosis not present

## 2020-07-27 DIAGNOSIS — M6281 Muscle weakness (generalized): Secondary | ICD-10-CM

## 2020-07-27 DIAGNOSIS — H548 Legal blindness, as defined in USA: Secondary | ICD-10-CM

## 2020-07-27 DIAGNOSIS — U071 COVID-19: Secondary | ICD-10-CM | POA: Diagnosis not present

## 2020-07-27 DIAGNOSIS — M19042 Primary osteoarthritis, left hand: Secondary | ICD-10-CM

## 2020-07-27 DIAGNOSIS — M47816 Spondylosis without myelopathy or radiculopathy, lumbar region: Secondary | ICD-10-CM

## 2020-07-27 DIAGNOSIS — K5909 Other constipation: Secondary | ICD-10-CM

## 2020-07-27 DIAGNOSIS — Z87891 Personal history of nicotine dependence: Secondary | ICD-10-CM

## 2020-07-27 DIAGNOSIS — Z7982 Long term (current) use of aspirin: Secondary | ICD-10-CM

## 2020-07-27 DIAGNOSIS — I4892 Unspecified atrial flutter: Secondary | ICD-10-CM

## 2020-07-27 DIAGNOSIS — M19041 Primary osteoarthritis, right hand: Secondary | ICD-10-CM

## 2020-07-27 DIAGNOSIS — Z86010 Personal history of colonic polyps: Secondary | ICD-10-CM

## 2020-07-27 DIAGNOSIS — M810 Age-related osteoporosis without current pathological fracture: Secondary | ICD-10-CM

## 2020-07-27 DIAGNOSIS — M4186 Other forms of scoliosis, lumbar region: Secondary | ICD-10-CM

## 2020-07-27 DIAGNOSIS — K439 Ventral hernia without obstruction or gangrene: Secondary | ICD-10-CM

## 2020-07-27 DIAGNOSIS — Z933 Colostomy status: Secondary | ICD-10-CM

## 2020-07-28 DIAGNOSIS — M47816 Spondylosis without myelopathy or radiculopathy, lumbar region: Secondary | ICD-10-CM | POA: Diagnosis not present

## 2020-07-28 DIAGNOSIS — M1A9XX0 Chronic gout, unspecified, without tophus (tophi): Secondary | ICD-10-CM | POA: Diagnosis not present

## 2020-07-28 DIAGNOSIS — M19041 Primary osteoarthritis, right hand: Secondary | ICD-10-CM | POA: Diagnosis not present

## 2020-07-28 DIAGNOSIS — H548 Legal blindness, as defined in USA: Secondary | ICD-10-CM | POA: Diagnosis not present

## 2020-07-28 DIAGNOSIS — U071 COVID-19: Secondary | ICD-10-CM | POA: Diagnosis not present

## 2020-07-28 DIAGNOSIS — M19042 Primary osteoarthritis, left hand: Secondary | ICD-10-CM | POA: Diagnosis not present

## 2020-07-28 DIAGNOSIS — E440 Moderate protein-calorie malnutrition: Secondary | ICD-10-CM | POA: Diagnosis not present

## 2020-07-28 DIAGNOSIS — M810 Age-related osteoporosis without current pathological fracture: Secondary | ICD-10-CM | POA: Diagnosis not present

## 2020-07-28 DIAGNOSIS — K439 Ventral hernia without obstruction or gangrene: Secondary | ICD-10-CM | POA: Diagnosis not present

## 2020-07-28 DIAGNOSIS — I110 Hypertensive heart disease with heart failure: Secondary | ICD-10-CM | POA: Diagnosis not present

## 2020-07-28 DIAGNOSIS — K5909 Other constipation: Secondary | ICD-10-CM | POA: Diagnosis not present

## 2020-07-28 DIAGNOSIS — I700 Atherosclerosis of aorta: Secondary | ICD-10-CM | POA: Diagnosis not present

## 2020-07-28 DIAGNOSIS — I5042 Chronic combined systolic (congestive) and diastolic (congestive) heart failure: Secondary | ICD-10-CM | POA: Diagnosis not present

## 2020-07-28 DIAGNOSIS — M160 Bilateral primary osteoarthritis of hip: Secondary | ICD-10-CM | POA: Diagnosis not present

## 2020-07-28 DIAGNOSIS — M4186 Other forms of scoliosis, lumbar region: Secondary | ICD-10-CM | POA: Diagnosis not present

## 2020-07-28 DIAGNOSIS — K5651 Intestinal adhesions [bands], with partial obstruction: Secondary | ICD-10-CM | POA: Diagnosis not present

## 2020-07-28 DIAGNOSIS — N179 Acute kidney failure, unspecified: Secondary | ICD-10-CM | POA: Diagnosis not present

## 2020-07-28 DIAGNOSIS — M069 Rheumatoid arthritis, unspecified: Secondary | ICD-10-CM | POA: Diagnosis not present

## 2020-07-28 DIAGNOSIS — I4892 Unspecified atrial flutter: Secondary | ICD-10-CM | POA: Diagnosis not present

## 2020-07-28 DIAGNOSIS — H9190 Unspecified hearing loss, unspecified ear: Secondary | ICD-10-CM | POA: Diagnosis not present

## 2020-07-28 DIAGNOSIS — J45909 Unspecified asthma, uncomplicated: Secondary | ICD-10-CM | POA: Diagnosis not present

## 2020-07-28 DIAGNOSIS — I4891 Unspecified atrial fibrillation: Secondary | ICD-10-CM | POA: Diagnosis not present

## 2020-07-28 DIAGNOSIS — K219 Gastro-esophageal reflux disease without esophagitis: Secondary | ICD-10-CM | POA: Diagnosis not present

## 2020-07-28 DIAGNOSIS — K5730 Diverticulosis of large intestine without perforation or abscess without bleeding: Secondary | ICD-10-CM | POA: Diagnosis not present

## 2020-07-28 NOTE — Telephone Encounter (Signed)
[name] from [facility] is calling in reference to an outstanding order that was faxed on 06/29/2020.  They still haven't received the fax back and it's a month out of Medicare compliance.  [name]  [phone]

## 2020-08-01 NOTE — Telephone Encounter (Signed)
This was faxed last week, as noted in her chart. Please see what's up with this

## 2020-08-01 NOTE — Telephone Encounter (Signed)
Form signed and faxed back to [facility] @ [phone]. Dm/cma

## 2020-08-01 NOTE — Telephone Encounter (Signed)
Called [facility] @ [place], [place] was looking for the order # [id number], advised that we haven't received it.  She refaxed it to [phone] and has been given to provider to sign.  Dm/cma

## 2020-08-02 DIAGNOSIS — I4891 Unspecified atrial fibrillation: Secondary | ICD-10-CM | POA: Diagnosis not present

## 2020-08-02 DIAGNOSIS — K5651 Intestinal adhesions [bands], with partial obstruction: Secondary | ICD-10-CM | POA: Diagnosis not present

## 2020-08-02 DIAGNOSIS — I5042 Chronic combined systolic (congestive) and diastolic (congestive) heart failure: Secondary | ICD-10-CM | POA: Diagnosis not present

## 2020-08-02 DIAGNOSIS — M1A9XX0 Chronic gout, unspecified, without tophus (tophi): Secondary | ICD-10-CM | POA: Diagnosis not present

## 2020-08-02 DIAGNOSIS — M19041 Primary osteoarthritis, right hand: Secondary | ICD-10-CM | POA: Diagnosis not present

## 2020-08-02 DIAGNOSIS — I700 Atherosclerosis of aorta: Secondary | ICD-10-CM | POA: Diagnosis not present

## 2020-08-02 DIAGNOSIS — J45909 Unspecified asthma, uncomplicated: Secondary | ICD-10-CM | POA: Diagnosis not present

## 2020-08-02 DIAGNOSIS — I110 Hypertensive heart disease with heart failure: Secondary | ICD-10-CM | POA: Diagnosis not present

## 2020-08-02 DIAGNOSIS — H9190 Unspecified hearing loss, unspecified ear: Secondary | ICD-10-CM | POA: Diagnosis not present

## 2020-08-02 DIAGNOSIS — I4892 Unspecified atrial flutter: Secondary | ICD-10-CM | POA: Diagnosis not present

## 2020-08-02 DIAGNOSIS — E440 Moderate protein-calorie malnutrition: Secondary | ICD-10-CM | POA: Diagnosis not present

## 2020-08-02 DIAGNOSIS — K5730 Diverticulosis of large intestine without perforation or abscess without bleeding: Secondary | ICD-10-CM | POA: Diagnosis not present

## 2020-08-02 DIAGNOSIS — M810 Age-related osteoporosis without current pathological fracture: Secondary | ICD-10-CM | POA: Diagnosis not present

## 2020-08-02 DIAGNOSIS — H548 Legal blindness, as defined in USA: Secondary | ICD-10-CM | POA: Diagnosis not present

## 2020-08-02 DIAGNOSIS — M160 Bilateral primary osteoarthritis of hip: Secondary | ICD-10-CM | POA: Diagnosis not present

## 2020-08-02 DIAGNOSIS — K219 Gastro-esophageal reflux disease without esophagitis: Secondary | ICD-10-CM | POA: Diagnosis not present

## 2020-08-02 DIAGNOSIS — M47816 Spondylosis without myelopathy or radiculopathy, lumbar region: Secondary | ICD-10-CM | POA: Diagnosis not present

## 2020-08-02 DIAGNOSIS — M069 Rheumatoid arthritis, unspecified: Secondary | ICD-10-CM | POA: Diagnosis not present

## 2020-08-02 DIAGNOSIS — M4186 Other forms of scoliosis, lumbar region: Secondary | ICD-10-CM | POA: Diagnosis not present

## 2020-08-02 DIAGNOSIS — M19042 Primary osteoarthritis, left hand: Secondary | ICD-10-CM | POA: Diagnosis not present

## 2020-08-02 DIAGNOSIS — U071 COVID-19: Secondary | ICD-10-CM | POA: Diagnosis not present

## 2020-08-02 DIAGNOSIS — N179 Acute kidney failure, unspecified: Secondary | ICD-10-CM | POA: Diagnosis not present

## 2020-08-02 DIAGNOSIS — K5909 Other constipation: Secondary | ICD-10-CM | POA: Diagnosis not present

## 2020-08-02 DIAGNOSIS — K439 Ventral hernia without obstruction or gangrene: Secondary | ICD-10-CM | POA: Diagnosis not present

## 2020-08-04 DIAGNOSIS — K5651 Intestinal adhesions [bands], with partial obstruction: Secondary | ICD-10-CM | POA: Diagnosis not present

## 2020-08-04 DIAGNOSIS — H548 Legal blindness, as defined in USA: Secondary | ICD-10-CM | POA: Diagnosis not present

## 2020-08-04 DIAGNOSIS — M069 Rheumatoid arthritis, unspecified: Secondary | ICD-10-CM | POA: Diagnosis not present

## 2020-08-04 DIAGNOSIS — M19041 Primary osteoarthritis, right hand: Secondary | ICD-10-CM | POA: Diagnosis not present

## 2020-08-04 DIAGNOSIS — I4891 Unspecified atrial fibrillation: Secondary | ICD-10-CM | POA: Diagnosis not present

## 2020-08-04 DIAGNOSIS — I700 Atherosclerosis of aorta: Secondary | ICD-10-CM | POA: Diagnosis not present

## 2020-08-04 DIAGNOSIS — K5730 Diverticulosis of large intestine without perforation or abscess without bleeding: Secondary | ICD-10-CM | POA: Diagnosis not present

## 2020-08-04 DIAGNOSIS — K5909 Other constipation: Secondary | ICD-10-CM | POA: Diagnosis not present

## 2020-08-04 DIAGNOSIS — M4186 Other forms of scoliosis, lumbar region: Secondary | ICD-10-CM | POA: Diagnosis not present

## 2020-08-04 DIAGNOSIS — I4892 Unspecified atrial flutter: Secondary | ICD-10-CM | POA: Diagnosis not present

## 2020-08-04 DIAGNOSIS — U071 COVID-19: Secondary | ICD-10-CM | POA: Diagnosis not present

## 2020-08-04 DIAGNOSIS — M47816 Spondylosis without myelopathy or radiculopathy, lumbar region: Secondary | ICD-10-CM | POA: Diagnosis not present

## 2020-08-04 DIAGNOSIS — M19042 Primary osteoarthritis, left hand: Secondary | ICD-10-CM | POA: Diagnosis not present

## 2020-08-04 DIAGNOSIS — K219 Gastro-esophageal reflux disease without esophagitis: Secondary | ICD-10-CM | POA: Diagnosis not present

## 2020-08-04 DIAGNOSIS — J45909 Unspecified asthma, uncomplicated: Secondary | ICD-10-CM | POA: Diagnosis not present

## 2020-08-04 DIAGNOSIS — I5042 Chronic combined systolic (congestive) and diastolic (congestive) heart failure: Secondary | ICD-10-CM | POA: Diagnosis not present

## 2020-08-04 DIAGNOSIS — H9190 Unspecified hearing loss, unspecified ear: Secondary | ICD-10-CM | POA: Diagnosis not present

## 2020-08-04 DIAGNOSIS — N179 Acute kidney failure, unspecified: Secondary | ICD-10-CM | POA: Diagnosis not present

## 2020-08-04 DIAGNOSIS — M810 Age-related osteoporosis without current pathological fracture: Secondary | ICD-10-CM | POA: Diagnosis not present

## 2020-08-04 DIAGNOSIS — K439 Ventral hernia without obstruction or gangrene: Secondary | ICD-10-CM | POA: Diagnosis not present

## 2020-08-04 DIAGNOSIS — E440 Moderate protein-calorie malnutrition: Secondary | ICD-10-CM | POA: Diagnosis not present

## 2020-08-04 DIAGNOSIS — M160 Bilateral primary osteoarthritis of hip: Secondary | ICD-10-CM | POA: Diagnosis not present

## 2020-08-04 DIAGNOSIS — I110 Hypertensive heart disease with heart failure: Secondary | ICD-10-CM | POA: Diagnosis not present

## 2020-08-04 DIAGNOSIS — M1A9XX0 Chronic gout, unspecified, without tophus (tophi): Secondary | ICD-10-CM | POA: Diagnosis not present

## 2020-08-05 DIAGNOSIS — U071 COVID-19: Secondary | ICD-10-CM | POA: Diagnosis not present

## 2020-08-05 DIAGNOSIS — K5651 Intestinal adhesions [bands], with partial obstruction: Secondary | ICD-10-CM | POA: Diagnosis not present

## 2020-08-05 DIAGNOSIS — M069 Rheumatoid arthritis, unspecified: Secondary | ICD-10-CM | POA: Diagnosis not present

## 2020-08-05 DIAGNOSIS — M160 Bilateral primary osteoarthritis of hip: Secondary | ICD-10-CM | POA: Diagnosis not present

## 2020-08-05 DIAGNOSIS — H9190 Unspecified hearing loss, unspecified ear: Secondary | ICD-10-CM | POA: Diagnosis not present

## 2020-08-05 DIAGNOSIS — M19041 Primary osteoarthritis, right hand: Secondary | ICD-10-CM | POA: Diagnosis not present

## 2020-08-05 DIAGNOSIS — M1A9XX0 Chronic gout, unspecified, without tophus (tophi): Secondary | ICD-10-CM | POA: Diagnosis not present

## 2020-08-05 DIAGNOSIS — M810 Age-related osteoporosis without current pathological fracture: Secondary | ICD-10-CM | POA: Diagnosis not present

## 2020-08-05 DIAGNOSIS — M4186 Other forms of scoliosis, lumbar region: Secondary | ICD-10-CM | POA: Diagnosis not present

## 2020-08-05 DIAGNOSIS — I5042 Chronic combined systolic (congestive) and diastolic (congestive) heart failure: Secondary | ICD-10-CM | POA: Diagnosis not present

## 2020-08-05 DIAGNOSIS — M47816 Spondylosis without myelopathy or radiculopathy, lumbar region: Secondary | ICD-10-CM | POA: Diagnosis not present

## 2020-08-05 DIAGNOSIS — J45909 Unspecified asthma, uncomplicated: Secondary | ICD-10-CM | POA: Diagnosis not present

## 2020-08-05 DIAGNOSIS — K219 Gastro-esophageal reflux disease without esophagitis: Secondary | ICD-10-CM | POA: Diagnosis not present

## 2020-08-05 DIAGNOSIS — N179 Acute kidney failure, unspecified: Secondary | ICD-10-CM | POA: Diagnosis not present

## 2020-08-05 DIAGNOSIS — K439 Ventral hernia without obstruction or gangrene: Secondary | ICD-10-CM | POA: Diagnosis not present

## 2020-08-05 DIAGNOSIS — I110 Hypertensive heart disease with heart failure: Secondary | ICD-10-CM | POA: Diagnosis not present

## 2020-08-05 DIAGNOSIS — I4891 Unspecified atrial fibrillation: Secondary | ICD-10-CM | POA: Diagnosis not present

## 2020-08-05 DIAGNOSIS — K5909 Other constipation: Secondary | ICD-10-CM | POA: Diagnosis not present

## 2020-08-05 DIAGNOSIS — M19042 Primary osteoarthritis, left hand: Secondary | ICD-10-CM | POA: Diagnosis not present

## 2020-08-05 DIAGNOSIS — E440 Moderate protein-calorie malnutrition: Secondary | ICD-10-CM | POA: Diagnosis not present

## 2020-08-05 DIAGNOSIS — K5730 Diverticulosis of large intestine without perforation or abscess without bleeding: Secondary | ICD-10-CM | POA: Diagnosis not present

## 2020-08-05 DIAGNOSIS — H548 Legal blindness, as defined in USA: Secondary | ICD-10-CM | POA: Diagnosis not present

## 2020-08-05 DIAGNOSIS — I700 Atherosclerosis of aorta: Secondary | ICD-10-CM | POA: Diagnosis not present

## 2020-08-05 DIAGNOSIS — I4892 Unspecified atrial flutter: Secondary | ICD-10-CM | POA: Diagnosis not present

## 2020-08-09 DIAGNOSIS — H9190 Unspecified hearing loss, unspecified ear: Secondary | ICD-10-CM | POA: Diagnosis not present

## 2020-08-09 DIAGNOSIS — M810 Age-related osteoporosis without current pathological fracture: Secondary | ICD-10-CM | POA: Diagnosis not present

## 2020-08-09 DIAGNOSIS — K219 Gastro-esophageal reflux disease without esophagitis: Secondary | ICD-10-CM | POA: Diagnosis not present

## 2020-08-09 DIAGNOSIS — J45909 Unspecified asthma, uncomplicated: Secondary | ICD-10-CM | POA: Diagnosis not present

## 2020-08-09 DIAGNOSIS — K439 Ventral hernia without obstruction or gangrene: Secondary | ICD-10-CM | POA: Diagnosis not present

## 2020-08-09 DIAGNOSIS — K5651 Intestinal adhesions [bands], with partial obstruction: Secondary | ICD-10-CM | POA: Diagnosis not present

## 2020-08-09 DIAGNOSIS — M069 Rheumatoid arthritis, unspecified: Secondary | ICD-10-CM | POA: Diagnosis not present

## 2020-08-09 DIAGNOSIS — K5730 Diverticulosis of large intestine without perforation or abscess without bleeding: Secondary | ICD-10-CM | POA: Diagnosis not present

## 2020-08-09 DIAGNOSIS — I700 Atherosclerosis of aorta: Secondary | ICD-10-CM | POA: Diagnosis not present

## 2020-08-09 DIAGNOSIS — I5042 Chronic combined systolic (congestive) and diastolic (congestive) heart failure: Secondary | ICD-10-CM | POA: Diagnosis not present

## 2020-08-09 DIAGNOSIS — E440 Moderate protein-calorie malnutrition: Secondary | ICD-10-CM | POA: Diagnosis not present

## 2020-08-09 DIAGNOSIS — K5909 Other constipation: Secondary | ICD-10-CM | POA: Diagnosis not present

## 2020-08-09 DIAGNOSIS — H548 Legal blindness, as defined in USA: Secondary | ICD-10-CM | POA: Diagnosis not present

## 2020-08-09 DIAGNOSIS — I110 Hypertensive heart disease with heart failure: Secondary | ICD-10-CM | POA: Diagnosis not present

## 2020-08-09 DIAGNOSIS — M19042 Primary osteoarthritis, left hand: Secondary | ICD-10-CM | POA: Diagnosis not present

## 2020-08-09 DIAGNOSIS — N179 Acute kidney failure, unspecified: Secondary | ICD-10-CM | POA: Diagnosis not present

## 2020-08-09 DIAGNOSIS — M160 Bilateral primary osteoarthritis of hip: Secondary | ICD-10-CM | POA: Diagnosis not present

## 2020-08-09 DIAGNOSIS — M1A9XX0 Chronic gout, unspecified, without tophus (tophi): Secondary | ICD-10-CM | POA: Diagnosis not present

## 2020-08-09 DIAGNOSIS — I4891 Unspecified atrial fibrillation: Secondary | ICD-10-CM | POA: Diagnosis not present

## 2020-08-09 DIAGNOSIS — M19041 Primary osteoarthritis, right hand: Secondary | ICD-10-CM | POA: Diagnosis not present

## 2020-08-09 DIAGNOSIS — I4892 Unspecified atrial flutter: Secondary | ICD-10-CM | POA: Diagnosis not present

## 2020-08-09 DIAGNOSIS — U071 COVID-19: Secondary | ICD-10-CM | POA: Diagnosis not present

## 2020-08-09 DIAGNOSIS — M47816 Spondylosis without myelopathy or radiculopathy, lumbar region: Secondary | ICD-10-CM | POA: Diagnosis not present

## 2020-08-09 DIAGNOSIS — M4186 Other forms of scoliosis, lumbar region: Secondary | ICD-10-CM | POA: Diagnosis not present

## 2020-08-10 DIAGNOSIS — M0549 Rheumatoid myopathy with rheumatoid arthritis of multiple sites: Secondary | ICD-10-CM | POA: Diagnosis not present

## 2020-08-11 DIAGNOSIS — K5730 Diverticulosis of large intestine without perforation or abscess without bleeding: Secondary | ICD-10-CM | POA: Diagnosis not present

## 2020-08-11 DIAGNOSIS — M19042 Primary osteoarthritis, left hand: Secondary | ICD-10-CM | POA: Diagnosis not present

## 2020-08-11 DIAGNOSIS — H548 Legal blindness, as defined in USA: Secondary | ICD-10-CM | POA: Diagnosis not present

## 2020-08-11 DIAGNOSIS — M069 Rheumatoid arthritis, unspecified: Secondary | ICD-10-CM | POA: Diagnosis not present

## 2020-08-11 DIAGNOSIS — U071 COVID-19: Secondary | ICD-10-CM | POA: Diagnosis not present

## 2020-08-11 DIAGNOSIS — M1A9XX0 Chronic gout, unspecified, without tophus (tophi): Secondary | ICD-10-CM | POA: Diagnosis not present

## 2020-08-11 DIAGNOSIS — E440 Moderate protein-calorie malnutrition: Secondary | ICD-10-CM | POA: Diagnosis not present

## 2020-08-11 DIAGNOSIS — M19041 Primary osteoarthritis, right hand: Secondary | ICD-10-CM | POA: Diagnosis not present

## 2020-08-11 DIAGNOSIS — I4891 Unspecified atrial fibrillation: Secondary | ICD-10-CM | POA: Diagnosis not present

## 2020-08-11 DIAGNOSIS — K5651 Intestinal adhesions [bands], with partial obstruction: Secondary | ICD-10-CM | POA: Diagnosis not present

## 2020-08-11 DIAGNOSIS — M47816 Spondylosis without myelopathy or radiculopathy, lumbar region: Secondary | ICD-10-CM | POA: Diagnosis not present

## 2020-08-11 DIAGNOSIS — K439 Ventral hernia without obstruction or gangrene: Secondary | ICD-10-CM | POA: Diagnosis not present

## 2020-08-11 DIAGNOSIS — N179 Acute kidney failure, unspecified: Secondary | ICD-10-CM | POA: Diagnosis not present

## 2020-08-11 DIAGNOSIS — M810 Age-related osteoporosis without current pathological fracture: Secondary | ICD-10-CM | POA: Diagnosis not present

## 2020-08-11 DIAGNOSIS — K219 Gastro-esophageal reflux disease without esophagitis: Secondary | ICD-10-CM | POA: Diagnosis not present

## 2020-08-11 DIAGNOSIS — I110 Hypertensive heart disease with heart failure: Secondary | ICD-10-CM | POA: Diagnosis not present

## 2020-08-11 DIAGNOSIS — H9190 Unspecified hearing loss, unspecified ear: Secondary | ICD-10-CM | POA: Diagnosis not present

## 2020-08-11 DIAGNOSIS — M160 Bilateral primary osteoarthritis of hip: Secondary | ICD-10-CM | POA: Diagnosis not present

## 2020-08-11 DIAGNOSIS — M4186 Other forms of scoliosis, lumbar region: Secondary | ICD-10-CM | POA: Diagnosis not present

## 2020-08-11 DIAGNOSIS — I5042 Chronic combined systolic (congestive) and diastolic (congestive) heart failure: Secondary | ICD-10-CM | POA: Diagnosis not present

## 2020-08-11 DIAGNOSIS — I4892 Unspecified atrial flutter: Secondary | ICD-10-CM | POA: Diagnosis not present

## 2020-08-11 DIAGNOSIS — K5909 Other constipation: Secondary | ICD-10-CM | POA: Diagnosis not present

## 2020-08-11 DIAGNOSIS — I700 Atherosclerosis of aorta: Secondary | ICD-10-CM | POA: Diagnosis not present

## 2020-08-11 DIAGNOSIS — J45909 Unspecified asthma, uncomplicated: Secondary | ICD-10-CM | POA: Diagnosis not present

## 2020-08-12 ENCOUNTER — Telehealth: Payer: Self-pay

## 2020-08-12 DIAGNOSIS — K219 Gastro-esophageal reflux disease without esophagitis: Secondary | ICD-10-CM | POA: Diagnosis not present

## 2020-08-12 DIAGNOSIS — M810 Age-related osteoporosis without current pathological fracture: Secondary | ICD-10-CM | POA: Diagnosis not present

## 2020-08-12 DIAGNOSIS — K5730 Diverticulosis of large intestine without perforation or abscess without bleeding: Secondary | ICD-10-CM | POA: Diagnosis not present

## 2020-08-12 DIAGNOSIS — K5909 Other constipation: Secondary | ICD-10-CM | POA: Diagnosis not present

## 2020-08-12 DIAGNOSIS — M160 Bilateral primary osteoarthritis of hip: Secondary | ICD-10-CM | POA: Diagnosis not present

## 2020-08-12 DIAGNOSIS — K439 Ventral hernia without obstruction or gangrene: Secondary | ICD-10-CM | POA: Diagnosis not present

## 2020-08-12 DIAGNOSIS — E440 Moderate protein-calorie malnutrition: Secondary | ICD-10-CM | POA: Diagnosis not present

## 2020-08-12 DIAGNOSIS — M069 Rheumatoid arthritis, unspecified: Secondary | ICD-10-CM | POA: Diagnosis not present

## 2020-08-12 DIAGNOSIS — I4891 Unspecified atrial fibrillation: Secondary | ICD-10-CM | POA: Diagnosis not present

## 2020-08-12 DIAGNOSIS — I4892 Unspecified atrial flutter: Secondary | ICD-10-CM | POA: Diagnosis not present

## 2020-08-12 DIAGNOSIS — M19042 Primary osteoarthritis, left hand: Secondary | ICD-10-CM | POA: Diagnosis not present

## 2020-08-12 DIAGNOSIS — U071 COVID-19: Secondary | ICD-10-CM | POA: Diagnosis not present

## 2020-08-12 DIAGNOSIS — K5651 Intestinal adhesions [bands], with partial obstruction: Secondary | ICD-10-CM | POA: Diagnosis not present

## 2020-08-12 DIAGNOSIS — H9190 Unspecified hearing loss, unspecified ear: Secondary | ICD-10-CM | POA: Diagnosis not present

## 2020-08-12 DIAGNOSIS — I5042 Chronic combined systolic (congestive) and diastolic (congestive) heart failure: Secondary | ICD-10-CM | POA: Diagnosis not present

## 2020-08-12 DIAGNOSIS — M4186 Other forms of scoliosis, lumbar region: Secondary | ICD-10-CM | POA: Diagnosis not present

## 2020-08-12 DIAGNOSIS — N179 Acute kidney failure, unspecified: Secondary | ICD-10-CM | POA: Diagnosis not present

## 2020-08-12 DIAGNOSIS — M47816 Spondylosis without myelopathy or radiculopathy, lumbar region: Secondary | ICD-10-CM | POA: Diagnosis not present

## 2020-08-12 DIAGNOSIS — I700 Atherosclerosis of aorta: Secondary | ICD-10-CM | POA: Diagnosis not present

## 2020-08-12 DIAGNOSIS — M19041 Primary osteoarthritis, right hand: Secondary | ICD-10-CM | POA: Diagnosis not present

## 2020-08-12 DIAGNOSIS — J45909 Unspecified asthma, uncomplicated: Secondary | ICD-10-CM | POA: Diagnosis not present

## 2020-08-12 DIAGNOSIS — H548 Legal blindness, as defined in USA: Secondary | ICD-10-CM | POA: Diagnosis not present

## 2020-08-12 DIAGNOSIS — M1A9XX0 Chronic gout, unspecified, without tophus (tophi): Secondary | ICD-10-CM | POA: Diagnosis not present

## 2020-08-12 DIAGNOSIS — I110 Hypertensive heart disease with heart failure: Secondary | ICD-10-CM | POA: Diagnosis not present

## 2020-08-12 NOTE — Telephone Encounter (Signed)
Prolia VOB initiated via [employer] portal.  

## 2020-08-18 ENCOUNTER — Other Ambulatory Visit: Payer: Self-pay | Admitting: Family Medicine

## 2020-08-18 NOTE — Telephone Encounter (Signed)
Pt is calling in stating that she is out of Rx Tramadol (ULTRAM) 50 MG Pharm:  [facility] on [address].  Pt is aware provider it not in the office and we ask for 24-72 hrs for any refills.

## 2020-08-19 NOTE — Telephone Encounter (Signed)
Pt is calling to check the status of the below msg and she is aware that it is on the doctors desktop to be reviewed.

## 2020-08-19 NOTE — Telephone Encounter (Signed)
Patient called wanting to make sure that Dr. Sarajane Jews has received her refill request for traMADol Veatrice Bourbon) 50 MG tablet   [facility] [phone] - [place], [place] AT [place] Phone:  [phone]  Fax:  [phone]

## 2020-08-19 NOTE — Telephone Encounter (Signed)
Pt is due on March 2nd for prolia. She was calling to see when she can get scheduled.

## 2020-08-19 NOTE — Telephone Encounter (Signed)
Pt LOV was on 07/23/2019 and last refill was done on 02/11/2020 for 180 tablets with 5 refills, Please advise

## 2020-08-19 NOTE — Telephone Encounter (Signed)
VOB Pending per [employer] Portal.

## 2020-08-20 NOTE — Telephone Encounter (Signed)
LVM for pt to let her know that her insurance is pending and once we hear back from them someone will reach out to her.

## 2020-08-22 DIAGNOSIS — Z79899 Other long term (current) drug therapy: Secondary | ICD-10-CM | POA: Diagnosis not present

## 2020-08-22 DIAGNOSIS — Z6822 Body mass index (BMI) 22.0-22.9, adult: Secondary | ICD-10-CM | POA: Diagnosis not present

## 2020-08-22 DIAGNOSIS — M2550 Pain in unspecified joint: Secondary | ICD-10-CM | POA: Diagnosis not present

## 2020-08-22 DIAGNOSIS — M1A09X0 Idiopathic chronic gout, multiple sites, without tophus (tophi): Secondary | ICD-10-CM | POA: Diagnosis not present

## 2020-08-22 DIAGNOSIS — M810 Age-related osteoporosis without current pathological fracture: Secondary | ICD-10-CM | POA: Diagnosis not present

## 2020-08-22 DIAGNOSIS — M0579 Rheumatoid arthritis with rheumatoid factor of multiple sites without organ or systems involvement: Secondary | ICD-10-CM | POA: Diagnosis not present

## 2020-08-23 DIAGNOSIS — Z933 Colostomy status: Secondary | ICD-10-CM | POA: Diagnosis not present

## 2020-08-23 DIAGNOSIS — M0549 Rheumatoid myopathy with rheumatoid arthritis of multiple sites: Secondary | ICD-10-CM | POA: Diagnosis not present

## 2020-08-26 ENCOUNTER — Telehealth: Payer: Self-pay | Admitting: Pharmacist

## 2020-08-26 NOTE — Chronic Care Management (AMB) (Addendum)
Chronic Care Management Pharmacy Assistant   Name: [name]  MRN: [id number] DOB: 1955/10/09  Reason for Encounter: Disease State   Conditions to be addressed/monitored: HTN  Recent office visits:  . 01.28.2022 [name], MD Family Medicine  o Started [facility] 10-325 MG: one tablet every 6 hours PRN o Valium 2 mg: one tablet every 12 hours PRN  . 01.20.2022 [name], MD Family Medicine o Stated HYCODAN)5-1.5 MG/5 ML syrup: Take 5 mls by mouth every 4 (four) hours as needed for cough. . 12.27.2021 [name], MD Family Medicine o Stated Amoxicillin 500 mg: one tablet three times daily  Recent consult visits:  None  Hospital visits:  Medication Reconciliation was completed by comparing discharge summary, patient's EMR and Pharmacy list, and upon discussion with patient.  Admitted to the hospital on 01.10.2022 due to Partial small bowel obstruction. Discharge date was 01.15.2022. Discharged from 01.15.2022 Hospital.    Medications that remain the same after Hospital Discharge:??  -All other medications will remain the same.    Medications: Outpatient Encounter Medications as of 08/26/2020  Medication Sig  . traMADol (ULTRAM) 50 MG tablet TAKE 1 TO 2 TABLETS BY MOUTH EVERY 6 HOURS AS NEEDED  . Abatacept 125 MG/ML SOSY Inject 125 mg into the skin every Monday.   Marland Kitchen albuterol (VENTOLIN HFA) 108 (90 Base) MCG/ACT inhaler Inhale 1 puff into the lungs every 4 (four) hours as needed for wheezing or shortness of breath.  . allopurinol (ZYLOPRIM) 100 MG tablet TAKE 1 TABLET BY MOUTH EVERY DAY (Patient taking differently: Take 100 mg by mouth daily.)  . amLODipine (NORVASC) 10 MG tablet TAKE 1 TABLET BY MOUTH EVERY DAY (Patient taking differently: Take 10 mg by mouth daily.)  . amoxicillin (AMOXIL) 500 MG capsule Take 1 capsule (500 mg total) by mouth 3 (three) times daily.  Marland Kitchen aspirin EC 81 MG tablet Take 1 tablet (81 mg total) by mouth daily.  Marland Kitchen CALCIUM PO Take 1  tablet by mouth 2 (two) times daily.  . cetirizine (ZYRTEC) 10 MG tablet Take 1 tablet (10 mg total) by mouth daily.  . Cholecalciferol (VITAMIN D) 2000 units CAPS Take 2,000 Units by mouth daily.  Marland Kitchen denosumab (PROLIA) 60 MG/ML SOSY injection Inject 60 mg into the skin every 6 (six) months.  . diazepam (VALIUM) 2 MG tablet Take 1 tablet (2 mg total) by mouth every 12 (twelve) hours as needed for anxiety.  . diclofenac sodium (VOLTAREN) 1 % GEL Apply 1 application topically 4 (four) times daily as needed (arthritis).  Marland Kitchen docusate sodium (COLACE) 100 MG capsule Take 100 mg by mouth 2 (two) times daily.  Marland Kitchen esomeprazole (NEXIUM) 40 MG capsule Take 1 capsule (40 mg total) by mouth daily in the afternoon.  . folic acid (FOLVITE) 254 MCG tablet Take 400 mcg by mouth daily.  Derrill Memo ON 09/19/2020] HYDROcodone-acetaminophen (NORCO) 10-325 MG tablet Take 1 tablet by mouth every 6 (six) hours as needed for moderate pain.  Marland Kitchen HYDROcodone-homatropine (HYCODAN) 5-1.5 MG/5ML syrup Take 5 mLs by mouth every 4 (four) hours as needed for cough.  Marland Kitchen ketoconazole (NIZORAL) 2 % shampoo Apply 1 application topically 2 (two) times a week.  . megestrol (MEGACE ES) 625 MG/5ML suspension take 5 milliliters by mouth three times daily before meals as needed for appetite (Patient taking differently: Take 625 mg by mouth daily as needed (appetite).)  . Methotrexate Sodium (METHOTREXATE, PF,) 50 MG/2ML injection Inject 0.5 mLs into the muscle  once a week. wednesday  . Multiple Vitamin (MULTIVITAMIN WITH MINERALS) TABS tablet Take 1 tablet by mouth daily.  Marland Kitchen PERIDEX 0.12 % solution Use as directed 15 mLs in the mouth or throat 2 (two) times daily.   . polyethylene glycol (MIRALAX / GLYCOLAX) 17 g packet Take 17 g by mouth daily as needed for mild constipation.  Marland Kitchen tiZANidine (ZANAFLEX) 4 MG tablet TAKE 1 TABLET(4 MG) BY MOUTH TWICE DAILY (Patient taking differently: Take 4 mg by mouth in the morning and at bedtime.)  . triamcinolone  lotion (KENALOG) 0.1 % Apply 1 application topically 2 (two) times daily as needed (itching).  . vitamin B-12 (CYANOCOBALAMIN) 500 MCG tablet Take 500 mcg by mouth daily.  . vitamin C (ASCORBIC ACID) 500 MG tablet Take 500 mg by mouth daily.    No facility-administered encounter medications on file as of 08/26/2020.   Reviewed chart prior to disease state call. Spoke with patient regarding BP  Recent Office Vitals: BP Readings from Last 3 Encounters:  07/09/20 (!) 141/83  06/20/20 (!) 142/80  05/12/20 140/79   Pulse Readings from Last 3 Encounters:  07/09/20 70  06/20/20 86  05/12/20 78    Wt Readings from Last 3 Encounters:  07/05/20 151 lb 0.2 oz (68.5 kg)  05/07/20 154 lb 1.6 oz (69.9 kg)  10/19/19 145 lb (65.8 kg)     Kidney Function Lab Results  Component Value Date/Time   CREATININE 0.70 07/06/2020 03:18 AM   CREATININE 1.13 (H) 07/05/2020 04:00 AM   CREATININE 0.76 11/19/2017 10:24 AM   GFR 107.53 04/20/2019 12:02 PM   GFRNONAA >60 07/06/2020 03:18 AM   GFRNONAA 85 11/19/2017 10:24 AM   GFRAA 98 11/19/2017 10:24 AM    BMP Latest Ref Rng & Units 07/06/2020 07/05/2020 07/04/2020  Glucose 70 - 99 mg/dL 89 96 116(H)  BUN 8 - 23 mg/dL 15 14 12   Creatinine 0.44 - 1.00 mg/dL 0.70 1.13(H) 1.32(H)  Sodium 135 - 145 mmol/L 146(H) 141 139  Potassium 3.5 - 5.1 mmol/L 3.9 3.7 3.7  Chloride 98 - 111 mmol/L 107 103 100  CO2 22 - 32 mmol/L 26 29 28   Calcium 8.9 - 10.3 mg/dL 8.2(L) 8.4(L) 9.6  Current antihypertensive regimen:  o Amlodipine 10 mg tablet: Daily . How often are you checking your Blood Pressure? infrequently . Current home BP readings:  o 02.28 142/80 o 03.03 144/80 o 03.06 137/76 o 03.07 140/82 . What recent interventions/DTPs have been made by any provider to improve Blood Pressure control since last CPP Visit: None . Any recent hospitalizations or ED visits since last visit with CPP? Yes . What diet changes have been made to improve Blood Pressure Control?   o None . What exercise is being done to improve your Blood Pressure Control?  o None  Adherence Review: Is the patient currently on ACE/ARB medication? No Does the patient have >5 day gap between last estimated fill dates? No  Star Rating Drugs:  Dispensed Days Supply Quantity  Amlodipine 10 mg 12.13.2021 90 90 each   I spoke with the patient and discussed medication adherence with the patient. She states that her home health nurse has recently stopped, and she has not taken her blood pressure since then. No emergency department visits since her discharge in January. No other changes to her current medications. She denies any side effects from her medication. Also, she denies any problems with her current pharmacy.  Maia Breslow, [place] Assistant [phone]

## 2020-08-29 NOTE — Telephone Encounter (Signed)
Pt called in regards to her prolia shot. Pt states it is now overdue and is wondering if this will badly effect her? I let the pt know we are still waiting on insurance and will call her ASAP once we hear back from them.

## 2020-08-29 NOTE — Telephone Encounter (Signed)
Please F/U with pt  Thank you,  Leamon Arnt

## 2020-09-01 NOTE — Telephone Encounter (Signed)
Pt ready for scheduling  Prolia co-insurance: 0% Admin fee: 0%  Pt cost $0.00

## 2020-09-01 NOTE — Telephone Encounter (Signed)
Prolia VOB - [employer]  PRIMARY MEDICAL BENEFIT DETAILS (PHYSICIAN PURCHASE, OR REFERRAL TO TREATING SITE) COVERAGE AVAILABLE: Yes  COVERAGE DETAILS: For the primary MD Purchase option, Prolia will be subject to a 20% coinsurance up to a $7550 out of pocket max ($0 met). Administration will be covered at 100%. Copay and deductible does not apply. Referral is not required. We have provided in network benefits only. AUTHORIZATION REQUIRED: No  Prolia VOB-Medicaid  SECONDARY MEDICAL BENEFIT DETAILS (PHYSICIAN PURCHASE, OR REFERRAL TO TREATING SITE) COVERAGE AVAILABLE: Yes COVERAGE DETAILS: For the secondary MD Purchase option, Medicaid will consider the Part B deductible and coinsurance at 100%. If the primary plan reimburses an amount greater than or equal to Medicaid's allowable, then no additional payment will be made. We have provided in network benefits only. AUTHORIZATION REQUIRED: No

## 2020-09-04 ENCOUNTER — Other Ambulatory Visit: Payer: Self-pay | Admitting: Family Medicine

## 2020-09-07 ENCOUNTER — Ambulatory Visit: Payer: Medicare Other

## 2020-09-08 NOTE — Telephone Encounter (Signed)
Scheduled for 09/13/2020.

## 2020-09-13 ENCOUNTER — Other Ambulatory Visit: Payer: Self-pay

## 2020-09-13 ENCOUNTER — Ambulatory Visit (INDEPENDENT_AMBULATORY_CARE_PROVIDER_SITE_OTHER): Payer: Medicare Other | Admitting: Endocrinology

## 2020-09-13 DIAGNOSIS — M810 Age-related osteoporosis without current pathological fracture: Secondary | ICD-10-CM | POA: Diagnosis not present

## 2020-09-13 MED ORDER — DENOSUMAB 60 MG/ML ~~LOC~~ SOSY
60.0000 mg | PREFILLED_SYRINGE | Freq: Once | SUBCUTANEOUS | Status: AC
  Administered 2020-09-13: 60 mg via SUBCUTANEOUS

## 2020-09-14 NOTE — Progress Notes (Signed)
Prolia injection administered in the Lt arm. Pt tolerated well.

## 2020-09-20 ENCOUNTER — Telehealth: Payer: Self-pay

## 2020-09-20 NOTE — Telephone Encounter (Signed)
Pt has been informed that it is ok to take predison with prolia

## 2020-09-20 NOTE — Telephone Encounter (Signed)
Pt wants to know if it is ok to take predisone with prolia   Please Advise

## 2020-09-20 NOTE — Telephone Encounter (Signed)
No problem with the Prolia

## 2020-09-21 ENCOUNTER — Ambulatory Visit: Payer: Medicare Other

## 2020-09-22 DIAGNOSIS — Z933 Colostomy status: Secondary | ICD-10-CM | POA: Diagnosis not present

## 2020-10-05 ENCOUNTER — Ambulatory Visit (INDEPENDENT_AMBULATORY_CARE_PROVIDER_SITE_OTHER): Payer: Medicare Other

## 2020-10-05 ENCOUNTER — Other Ambulatory Visit: Payer: Self-pay

## 2020-10-05 DIAGNOSIS — Z0000 Encounter for general adult medical examination without abnormal findings: Secondary | ICD-10-CM

## 2020-10-05 NOTE — Patient Instructions (Addendum)
[name] , Thank you for taking time to come for your Medicare Wellness Visit. I appreciate your ongoing commitment to your health goals. Please review the following plan we discussed and let me know if I can assist you in the future.   Screening recommendations/referrals: Colonoscopy: Done 11/06/16 Mammogram: Done 10/20/19 Bone Density: Done 10/05/19 Recommended yearly ophthalmology/optometry visit for glaucoma screening and checkup Recommended yearly dental visit for hygiene and checkup  Vaccinations: Influenza vaccine: Up to date Pneumococcal vaccine: Up to date Tdap vaccine: Due and discussed Shingles vaccine: Shingrix discussed. Please contact your pharmacy for coverage information.   Covid-19: Declined   Advanced directives: Advance directive discussed with you today. Even though you declined this today please call our office should you change your mind and we can give you the proper paperwork for you to fill out.  Conditions/risks identified: None at this time  Next appointment: Follow up in one year for your annual wellness visit.   Preventive Care 40-64 Years, Female Preventive care refers to lifestyle choices and visits with your health care provider that can promote health and wellness. What does preventive care include?  A yearly physical exam. This is also called an annual well check.  Dental exams once or twice a year.  Routine eye exams. Ask your health care provider how often you should have your eyes checked.  Personal lifestyle choices, including:  Daily care of your teeth and gums.  Regular physical activity.  Eating a healthy diet.  Avoiding tobacco and drug use.  Limiting alcohol use.  Practicing safe sex.  Taking low-dose aspirin daily starting at age 45.  Taking vitamin and mineral supplements as recommended by your health care provider. What happens during an annual well check? The services and screenings done by your health care provider  during your annual well check will depend on your age, overall health, lifestyle risk factors, and family history of disease. Counseling  Your health care provider may ask you questions about your:  Alcohol use.  Tobacco use.  Drug use.  Emotional well-being.  Home and relationship well-being.  Sexual activity.  Eating habits.  Work and work Statistician.  Method of birth control.  Menstrual cycle.  Pregnancy history. Screening  You may have the following tests or measurements:  Height, weight, and BMI.  Blood pressure.  Lipid and cholesterol levels. These may be checked every 5 years, or more frequently if you are over 94 years old.  Skin check.  Lung cancer screening. You may have this screening every year starting at age 35 if you have a 30-pack-year history of smoking and currently smoke or have quit within the past 15 years.  Fecal occult blood test (FOBT) of the stool. You may have this test every year starting at age 81.  Flexible sigmoidoscopy or colonoscopy. You may have a sigmoidoscopy every 5 years or a colonoscopy every 10 years starting at age 12.  Hepatitis C blood test.  Hepatitis B blood test.  Sexually transmitted disease (STD) testing.  Diabetes screening. This is done by checking your blood sugar (glucose) after you have not eaten for a while (fasting). You may have this done every 1-3 years.  Mammogram. This may be done every 1-2 years. Talk to your health care provider about when you should start having regular mammograms. This may depend on whether you have a family history of breast cancer.  BRCA-related cancer screening. This may be done if you have a family history of breast, ovarian, tubal, or  peritoneal cancers.  Pelvic exam and Pap test. This may be done every 3 years starting at age 26. Starting at age 50, this may be done every 5 years if you have a Pap test in combination with an HPV test.  Bone density scan. This is done to screen  for osteoporosis. You may have this scan if you are at high risk for osteoporosis. Discuss your test results, treatment options, and if necessary, the need for more tests with your health care provider. Vaccines  Your health care provider may recommend certain vaccines, such as:  Influenza vaccine. This is recommended every year.  Tetanus, diphtheria, and acellular pertussis (Tdap, Td) vaccine. You may need a Td booster every 10 years.  Zoster vaccine. You may need this after age 10.  Pneumococcal 13-valent conjugate (PCV13) vaccine. You may need this if you have certain conditions and were not previously vaccinated.  Pneumococcal polysaccharide (PPSV23) vaccine. You may need one or two doses if you smoke cigarettes or if you have certain conditions. Talk to your health care provider about which screenings and vaccines you need and how often you need them. This information is not intended to replace advice given to you by your health care provider. Make sure you discuss any questions you have with your health care provider. Document Released: 07/08/2015 Document Revised: 02/29/2016 Document Reviewed: 04/12/2015 Elsevier Interactive Patient Education  2017 [place] Prevention in the Home Falls can cause injuries. They can happen to people of all ages. There are many things you can do to make your home safe and to help prevent falls. What can I do on the outside of my home?  Regularly fix the edges of walkways and driveways and fix any cracks.  Remove anything that might make you trip as you walk through a door, such as a raised step or threshold.  Trim any bushes or trees on the path to your home.  Use bright outdoor lighting.  Clear any walking paths of anything that might make someone trip, such as rocks or tools.  Regularly check to see if handrails are loose or broken. Make sure that both sides of any steps have handrails.  Any raised decks and porches should  have guardrails on the edges.  Have any leaves, snow, or ice cleared regularly.  Use sand or salt on walking paths during winter.  Clean up any spills in your garage right away. This includes oil or grease spills. What can I do in the bathroom?  Use night lights.  Install grab bars by the toilet and in the tub and shower. Do not use towel bars as grab bars.  Use non-skid mats or decals in the tub or shower.  If you need to sit down in the shower, use a plastic, non-slip stool.  Keep the floor dry. Clean up any water that spills on the floor as soon as it happens.  Remove soap buildup in the tub or shower regularly.  Attach bath mats securely with double-sided non-slip rug tape.  Do not have throw rugs and other things on the floor that can make you trip. What can I do in the bedroom?  Use night lights.  Make sure that you have a light by your bed that is easy to reach.  Do not use any sheets or blankets that are too big for your bed. They should not hang down onto the floor.  Have a firm chair that has side arms. You can  use this for support while you get dressed.  Do not have throw rugs and other things on the floor that can make you trip. What can I do in the kitchen?  Clean up any spills right away.  Avoid walking on wet floors.  Keep items that you use a lot in easy-to-reach places.  If you need to reach something above you, use a strong step stool that has a grab bar.  Keep electrical cords out of the way.  Do not use floor polish or wax that makes floors slippery. If you must use wax, use non-skid floor wax.  Do not have throw rugs and other things on the floor that can make you trip. What can I do with my stairs?  Do not leave any items on the stairs.  Make sure that there are handrails on both sides of the stairs and use them. Fix handrails that are broken or loose. Make sure that handrails are as long as the stairways.  Check any carpeting to make sure  that it is firmly attached to the stairs. Fix any carpet that is loose or worn.  Avoid having throw rugs at the top or bottom of the stairs. If you do have throw rugs, attach them to the floor with carpet tape.  Make sure that you have a light switch at the top of the stairs and the bottom of the stairs. If you do not have them, ask someone to add them for you. What else can I do to help prevent falls?  Wear shoes that:  Do not have high heels.  Have rubber bottoms.  Are comfortable and fit you well.  Are closed at the toe. Do not wear sandals.  If you use a stepladder:  Make sure that it is fully opened. Do not climb a closed stepladder.  Make sure that both sides of the stepladder are locked into place.  Ask someone to hold it for you, if possible.  Clearly mark and make sure that you can see:  Any grab bars or handrails.  First and last steps.  Where the edge of each step is.  Use tools that help you move around (mobility aids) if they are needed. These include:  Canes.  Walkers.  Scooters.  Crutches.  Turn on the lights when you go into a dark area. Replace any light bulbs as soon as they burn out.  Set up your furniture so you have a clear path. Avoid moving your furniture around.  If any of your floors are uneven, fix them.  If there are any pets around you, be aware of where they are.  Review your medicines with your doctor. Some medicines can make you feel dizzy. This can increase your chance of falling. Ask your doctor what other things that you can do to help prevent falls. This information is not intended to replace advice given to you by your health care provider. Make sure you discuss any questions you have with your health care provider. Document Released: 04/07/2009 Document Revised: 11/17/2015 Document Reviewed: 07/16/2014 Elsevier Interactive Patient Education  2017 [employer].

## 2020-10-05 NOTE — Progress Notes (Signed)
Virtual Visit via Telephone Note  I connected with  [name] on 10/05/20 at  2:30 PM EDT by telephone and verified that I am speaking with the correct person using two identifiers.  Medicare Annual Wellness visit completed telephonically due to Covid-19 pandemic.   Persons participating in this call: This Health Coach and this patient.   Location: Patient: Home Provider: Office   I discussed the limitations, risks, security and privacy concerns of performing an evaluation and management service by telephone and the availability of in person appointments. The patient expressed understanding and agreed to proceed.  Unable to perform video visit due to video visit attempted and failed and/or patient does not have video capability.   Some vital signs may be absent or patient reported.   Willette Brace, LPN    Subjective:   LAKETHIA COPPESS is a 65 y.o. female who presents for an Initial Medicare Annual Wellness Visit.  Review of Systems     Cardiac Risk Factors include: hypertension;dyslipidemia     Objective:    There were no vitals filed for this visit. There is no height or weight on file to calculate BMI.  Advanced Directives 10/05/2020 07/05/2020 05/12/2020 05/08/2020 10/23/2017 10/22/2017 11/06/2016  Does Patient Have a Medical Advance Directive? No No No No No No No  Would patient like information on creating a medical advance directive? No - Patient declined No - Patient declined No - Patient declined No - Patient declined No - Patient declined No - Patient declined -  Pre-existing out of facility DNR order (yellow form or pink MOST form) - - - - - - -    Current Medications (verified) Outpatient Encounter Medications as of 10/05/2020  Medication Sig  . Abatacept 125 MG/ML SOSY Inject 125 mg into the skin every Monday. tuesday  . albuterol (VENTOLIN HFA) 108 (90 Base) MCG/ACT inhaler Inhale 1 puff into the lungs every 4 (four) hours as needed for wheezing or  shortness of breath.  . allopurinol (ZYLOPRIM) 100 MG tablet TAKE 1 TABLET BY MOUTH EVERY DAY (Patient taking differently: Take 100 mg by mouth daily.)  . amLODipine (NORVASC) 10 MG tablet TAKE 1 TABLET BY MOUTH EVERY DAY  . aspirin EC 81 MG tablet Take 1 tablet (81 mg total) by mouth daily.  Marland Kitchen CALCIUM PO Take 1 tablet by mouth 2 (two) times daily.  . cetirizine (ZYRTEC) 10 MG tablet Take 1 tablet (10 mg total) by mouth daily.  . Cholecalciferol (VITAMIN D) 2000 units CAPS Take 2,000 Units by mouth daily.  Marland Kitchen denosumab (PROLIA) 60 MG/ML SOSY injection Inject 60 mg into the skin every 6 (six) months.  . diazepam (VALIUM) 2 MG tablet Take 1 tablet (2 mg total) by mouth every 12 (twelve) hours as needed for anxiety.  . diclofenac sodium (VOLTAREN) 1 % GEL Apply 1 application topically 4 (four) times daily as needed (arthritis).  Marland Kitchen docusate sodium (COLACE) 100 MG capsule Take 100 mg by mouth 2 (two) times daily.  Marland Kitchen esomeprazole (NEXIUM) 40 MG capsule Take 1 capsule (40 mg total) by mouth daily in the afternoon.  . folic acid (FOLVITE) 416 MCG tablet Take 400 mcg by mouth daily.  Marland Kitchen HYDROcodone-acetaminophen (NORCO) 10-325 MG tablet Take 1 tablet by mouth every 6 (six) hours as needed for moderate pain.  Marland Kitchen ketoconazole (NIZORAL) 2 % shampoo Apply 1 application topically 2 (two) times a week.  . megestrol (MEGACE ES) 625 MG/5ML suspension take 5 milliliters by mouth three times daily  before meals as needed for appetite (Patient taking differently: Take 625 mg by mouth daily as needed (appetite).)  . Methotrexate Sodium (METHOTREXATE, PF,) 50 MG/2ML injection Inject 0.5 mLs into the muscle once a week. Tues  . Multiple Vitamin (MULTIVITAMIN WITH MINERALS) TABS tablet Take 1 tablet by mouth daily.  Marland Kitchen PERIDEX 0.12 % solution Use as directed 15 mLs in the mouth or throat 2 (two) times daily.   . polyethylene glycol (MIRALAX / GLYCOLAX) 17 g packet Take 17 g by mouth daily as needed for mild constipation.  Marland Kitchen  tiZANidine (ZANAFLEX) 4 MG tablet TAKE 1 TABLET(4 MG) BY MOUTH TWICE DAILY (Patient taking differently: Take 4 mg by mouth in the morning and at bedtime.)  . traMADol (ULTRAM) 50 MG tablet TAKE 1 TO 2 TABLETS BY MOUTH EVERY 6 HOURS AS NEEDED  . triamcinolone lotion (KENALOG) 0.1 % Apply 1 application topically 2 (two) times daily as needed (itching).  . vitamin B-12 (CYANOCOBALAMIN) 500 MCG tablet Take 500 mcg by mouth daily.  . vitamin C (ASCORBIC ACID) 500 MG tablet Take 500 mg by mouth daily.   Marland Kitchen amoxicillin (AMOXIL) 500 MG capsule Take 1 capsule (500 mg total) by mouth 3 (three) times daily. (Patient not taking: Reported on 10/05/2020)  . HYDROcodone-homatropine (HYCODAN) 5-1.5 MG/5ML syrup Take 5 mLs by mouth every 4 (four) hours as needed for cough. (Patient not taking: Reported on 10/05/2020)   No facility-administered encounter medications on file as of 10/05/2020.    Allergies (verified) Spinach   History: Past Medical History:  Diagnosis Date  . Atrial flutter ([place])   . CHF (congestive heart failure) ([place])   . Chronic gout 2008  . Diverticulitis of large intestine with perforation   . Dysrhythmia   . Fibroid   . Hearing aid worn    pt wears bilateral hearing aids  . Hernia 4/08  . Hx of adenomatous polyp of colon 11/13/2016  . Hypertension   . Legally blind    since pt was a teenager  . Marfan's syndrome affecting skin    with scolosis  . Osteoporosis   . 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 Rheumatoid arthritis [facility])    sees Dr. Gavin Pound   . SBO (small bowel obstruction) ([place]) 01/05/2013  . Small bowel obstruction due to adhesions ([place])   . Status post bunionectomy 1/10   bilateral   . Stroke ([place]) 2/08  . 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 ABLATION  01/04/14   atrial flutter ablation (2 circuits) by Dr Lovena Le  . ATRIAL FLUTTER  ABLATION N/A 01/04/2014   Procedure: ATRIAL FLUTTER ABLATION;  Surgeon: Evans Lance, MD;  Location: [facility] CATH LAB;  Service: Cardiovascular;  Laterality: N/A;  . BUNIONECTOMY Bilateral 1/10  . COLON SURGERY     colectomy  . COLOSTOMY  11/27/2012  . HERNIA REPAIR    . IR RADIOLOGIST EVAL & MGMT  10/01/2017  . IR RADIOLOGIST EVAL & MGMT  11/21/2017  . IR RADIOLOGIST EVAL & MGMT  02/13/2018  . IR RADIOLOGIST EVAL & MGMT  01/08/2019  . IR RADIOLOGIST EVAL & MGMT  12/29/2019  . LAPAROSCOPIC ABDOMINAL EXPLORATION N/A 01/06/2013   Procedure: LAPAROSCOPIC ABDOMINAL EXPLORATION;  Surgeon: Adin Hector, MD;  Location: [facility] ORS;  Service: General;  Laterality: N/A;  . LAPAROSCOPIC LYSIS OF ADHESIONS N/A 01/06/2013  Procedure: LAPAROSCOPIC LYSIS OF ADHESIONS/ INTEROTOMY REPAIR;  Surgeon: Adin Hector, MD;  Location: [facility] ORS;  Service: General;  Laterality: N/A;  . LAPAROTOMY N/A 11/27/2012   Procedure: EXPLORATORY LAPAROTOMY SIGMOID COLECTOMY, COLOSTOMY;  Surgeon: Madilyn Hook, DO;  Location: [facility] ORS;  Service: General;  Laterality: N/A;  . RADIOLOGY WITH ANESTHESIA Left 10/23/2017   Procedure: CT RENAL CRYO AND BIOPSY;  Surgeon: Aletta Edouard, MD;  Location: [facility] ORS;  Service: Radiology;  Laterality: Left;  . TOTAL KNEE ARTHROPLASTY Bilateral 6/08   Family History  Problem Relation Age of Onset  . Heart attack Neg Hx   . Colon cancer Neg Hx   . Osteoporosis Neg Hx    Social History   Socioeconomic History  . Marital status: Single    Spouse name: Not on file  . Number of children: 1  . Years of education: Not on file  . Highest education level: Not on file  Occupational History  . Not on file  Tobacco Use  . Smoking status: Former Smoker    Packs/day: 1.00    Types: Cigarettes    Quit date: 11/27/2012    Years since quitting: 7.8  . Smokeless tobacco: Never Used  Vaping Use  . Vaping Use: Former  Substance and Sexual Activity  . Alcohol use: No    Alcohol/week: 0.0 standard drinks     Comment: recovering alcoholic sober since [id number]  . Drug use: No  . Sexual activity: Not Currently    Partners: Male    Birth control/protection: None  Other Topics Concern  . Not on file  Social History Narrative  . Not on file   Social Determinants of Health   Financial Resource Strain: Low Risk   . Difficulty of Paying Living Expenses: Not hard at all  Food Insecurity: No Food Insecurity  . Worried About Charity fundraiser in the Last Year: Never true  . Ran Out of Food in the Last Year: Never true  Transportation Needs: No Transportation Needs  . Lack of Transportation (Medical): No  . Lack of Transportation (Non-Medical): No  Physical Activity: Inactive  . Days of Exercise per Week: 0 days  . Minutes of Exercise per Session: 0 min  Stress: No Stress Concern Present  . Feeling of Stress : Not at all  Social Connections: Unknown  . Frequency of Communication with Friends and Family: Not on file  . Frequency of Social Gatherings with Friends and Family: Once a week  . Attends Religious Services: Never  . Active Member of Clubs or Organizations: No  . Attends Archivist Meetings: Never  . Marital Status: Never married    Tobacco Counseling Counseling given: Not Answered   Clinical Intake:  Pre-visit preparation completed: Yes  Pain : No/denies pain     BMI - recorded: 22.97 Nutritional Status: BMI of 19-24  Normal Nutritional Risks: None Diabetes: No  How often do you need to have someone help you when you read instructions, pamphlets, or other written materials from your doctor or pharmacy?: 1 - Never  Diabetic?No  Interpreter Needed?: No  Information entered by :: Charlott Rakes, LPN   Activities of Daily Living In your present state of health, do you have any difficulty performing the following activities: 10/05/2020 07/05/2020  Hearing? Tempie Donning  Comment wears hearing aid wears bilateral hearing iads  Vision? Y Y  Comment pt blind patien is  blind  Difficulty concentrating or making decisions? N N  Walking or climbing stairs?  Y Y  Comment no stairs and pt is blind secondary to weakness  Dressing or bathing? N Y  Doing errands, shopping? N Y  Comment - -  Conservation officer, nature and eating ? N -  Using the Toilet? N -  In the past six months, have you accidently leaked urine? N -  Do you have problems with loss of bowel control? Y -  Comment has a colostomy -  Managing your Medications? N -  Managing your Finances? N -  Housekeeping or managing your Housekeeping? N -  Some recent data might be hidden    Patient Care Team: [name], MD as PCP - General Belva Crome, MD as PCP - Cardiology (Cardiology) Gatha Mayer, MD as Consulting Physician (Gastroenterology) Gavin Pound, MD as Consulting Physician (Rheumatology) Viona Gilmore, [facility] as Pharmacist (Pharmacist)  Indicate any recent Medical Services you may have received from other than [facility] providers in the past year (date may be approximate).     Assessment:   This is a routine wellness examination for [place].  Hearing/Vision screen  Hearing Screening   125Hz  250Hz  500Hz  1000Hz  2000Hz  3000Hz  4000Hz  6000Hz  8000Hz   Right ear:           Left ear:           Comments: Pt wears hearing aids   Vision Screening Comments: Pt is blid and doesn't seek eye care   Dietary issues and exercise activities discussed: Current Exercise Habits: The patient does not participate in regular exercise at present  Goals    . Patient Stated     None at this time    . Pharmacy Care Plan     CARE PLAN ENTRY  Current Barriers:  . Chronic Disease Management support, education, and care coordination needs related to Hypertension, Hyperlipidemia, GERD, and History of stroke   Hypertension . Pharmacist Clinical Goal(s): o Over the next 00 days, patient will work with PharmD and providers to maintain BP goal <130/80 . Current regimen:  o Amlodipine 10mg ,1 tablet once  daily . Patient self care activities - Over the next 90 days, patient will: o Check BP as directed, document, and provide at future appointments o Ensure daily salt intake < 2300 mg/day  Hyperlipidemia . Pharmacist Clinical Goal(s): o Over the next 120 days, patient will work with PharmD and providers to maintain LDL goal < 100 . Current regimen:  o No medications . Interventions: o Recommend cholesterol blood work at next physical with Dr. Sarajane Jews.  . Patient self care activities - Over the next 120 days, patient will: o Schedule physical with Dr. Sarajane Jews.   GERD . Pharmacist Clinical Goal(s) o Over the next 120 days, patient will work with PharmD and providers to minimize acid reflux symptoms.  . Current regimen:   Esomeprazole 40mg , 1 capsule once daily  . Interventions: o Discussed non-pharmacological interventions for acid reflux. Take measures to prevent acid reflux, such as avoiding spicy foods, avoiding caffeine, avoid laying down a few hours after eating, and raising the head of the bed . Patient self care activities - Over the next 120 days, patient will: o Avoid trigger foods.  o Take Tums as needed for breakthrough heartburn  History of stroke . Pharmacist Clinical Goal(s) o Over the next 120 days, patient will work with PharmD and providers to take aspirin to prevent stroke. . Current regimen:  o Aspirin 81mg , 1 tablet once daily . Patient self care activities o Patient will continue current  medications.   Medication management . Pharmacist Clinical Goal(s): o Over the next 90 days, patient will work with PharmD and providers to maintain optimal medication adherence . Current pharmacy: [facility] . Interventions o Comprehensive medication review performed. o Continue current medication management strategy . Patient self care activities - Over the next 90 days, patient will: o Take medications as prescribed o Report any questions or concerns to PharmD and/or  provider(s)  Please see past updates related to this goal by clicking on the "Past Updates" button in the selected goal        Depression Screen PHQ 2/9 Scores 10/05/2020 06/20/2020 04/20/2019 08/19/2017  PHQ - 2 Score 0 0 0 0  PHQ- 9 Score - 0 0 -    Fall Risk Fall Risk  10/05/2020 06/20/2020  Falls in the past year? 0 0  Number falls in past yr: 0 -  Injury with Fall? 0 0  Risk for fall due to : Impaired mobility;Impaired balance/gait;Impaired vision -  Follow up Falls prevention discussed -    FALL RISK PREVENTION PERTAINING TO THE HOME:  Any stairs in or around the home? No  If so, are there any without handrails? No  Home free of loose throw rugs in walkways, pet beds, electrical cords, etc? Yes  Adequate lighting in your home to reduce risk of falls? Yes   ASSISTIVE DEVICES UTILIZED TO PREVENT FALLS:  Life alert? Yes  Use of a cane, walker or w/c? Yes  Grab bars in the bathroom? No  Shower chair or bench in shower? Yes  Elevated toilet seat or a handicapped toilet? Yes   TIMED UP AND GO:  Was the test performed? No     Cognitive Function:     6CIT Screen 10/05/2020  What Year? 0 points  What month? 0 points  Count back from 20 0 points  Months in reverse 0 points  Repeat phrase 0 points    Immunizations Immunization History  Administered Date(s) Administered  . DTaP 10/07/2005  . Fluad Quad(high Dose 65+) 04/20/2019  . Influenza Split 04/30/2011, 04/16/2012  . Influenza Whole 04/25/2002, 04/03/2010  . Influenza,inj,Quad PF,6+ Mos 03/23/2013, 05/09/2015, 04/15/2017, 04/21/2018, 06/20/2020  . Influenza-Unspecified 04/26/2014  . Pneumococcal Conjugate-13 07/18/2015  . Pneumococcal Polysaccharide-23 04/25/2002, 04/03/2010  . Tdap 09/04/2010    TDAP status: Due, Education has been provided regarding the importance of this vaccine. Advised may receive this vaccine at local pharmacy or Health Dept. Aware to provide a copy of the vaccination record if  obtained from local pharmacy or Health Dept. Verbalized acceptance and understanding.  Flu Vaccine status: Up to date  Pneumococcal vaccine status: Up to date  Covid-19 vaccine status: Declined, Education has been provided regarding the importance of this vaccine but patient still declined. Advised may receive this vaccine at local pharmacy or Health Dept.or vaccine clinic. Aware to provide a copy of the vaccination record if obtained from local pharmacy or Health Dept. Verbalized acceptance and understanding.  Qualifies for Shingles Vaccine? Yes   Zostavax completed No   Shingrix Completed?: No.    Education has been provided regarding the importance of this vaccine. Patient has been advised to call insurance company to determine out of pocket expense if they have not yet received this vaccine. Advised may also receive vaccine at local pharmacy or Health Dept. Verbalized acceptance and understanding.  Screening Tests Health Maintenance  Topic Date Due  . Hepatitis C Screening  Never done  . FOOT EXAM  Never done  .  OPHTHALMOLOGY EXAM  Never done  . URINE MICROALBUMIN  Never done  . HEMOGLOBIN A1C  12/08/2015  . TETANUS/TDAP  09/03/2020  . INFLUENZA VACCINE  01/23/2021  . MAMMOGRAM  10/19/2021  . COLONOSCOPY (Pts 45-77yrs Insurance coverage will need to be confirmed)  11/06/2021  . HIV Screening  Completed  . HPV VACCINES  Aged Out  . PAP SMEAR-Modifier  Discontinued  . COVID-19 Vaccine  Discontinued    Health Maintenance  Health Maintenance Due  Topic Date Due  . Hepatitis C Screening  Never done  . FOOT EXAM  Never done  . OPHTHALMOLOGY EXAM  Never done  . URINE MICROALBUMIN  Never done  . HEMOGLOBIN A1C  12/08/2015  . TETANUS/TDAP  09/03/2020    Colorectal cancer screening: Type of screening: Colonoscopy. Completed 11/06/16. Repeat every 5 years  Mammogram status: Completed 10/20/19. Repeat every year  Bone Density status: Completed 10/05/19. Results reflect: Bone  density results: OSTEOPOROSIS. Repeat every 2 years.   Additional Screening:  Hepatitis C Screening: does qualify  Vision Screening: Recommended annual ophthalmology exams for early detection of glaucoma and other disorders of the eye. Is the patient up to date with their annual eye exam?  No  Who is the provider or what is the name of the office in which the patient attends annual eye exams?  If pt is not established with a provider, would they like to be referred to a provider to establish care? No .   Dental Screening: Recommended annual dental exams for proper oral hygiene  Community Resource Referral / Chronic Care Management: CRR required this visit?  No   CCM required this visit?  No      Plan:     I have personally reviewed and noted the following in the patient's chart:   . Medical and social history . Use of alcohol, tobacco or illicit drugs  . Current medications and supplements . Functional ability and status . Nutritional status . Physical activity . Advanced directives . List of other physicians . Hospitalizations, surgeries, and ER visits in previous 12 months . Vitals . Screenings to include cognitive, depression, and falls . Referrals and appointments  In addition, I have reviewed and discussed with patient certain preventive protocols, quality metrics, and best practice recommendations. A written personalized care plan for preventive services as well as general preventive health recommendations were provided to patient.     Willette Brace, LPN   [id number]   Nurse Notes: None

## 2020-10-11 DIAGNOSIS — M0549 Rheumatoid myopathy with rheumatoid arthritis of multiple sites: Secondary | ICD-10-CM | POA: Diagnosis not present

## 2020-10-20 ENCOUNTER — Telehealth: Payer: Self-pay

## 2020-10-21 ENCOUNTER — Encounter: Payer: Self-pay | Admitting: Family Medicine

## 2020-10-21 ENCOUNTER — Telehealth (INDEPENDENT_AMBULATORY_CARE_PROVIDER_SITE_OTHER): Payer: Medicare Other | Admitting: Family Medicine

## 2020-10-21 VITALS — Temp 98.1°F | Wt 150.0 lb

## 2020-10-21 DIAGNOSIS — M069 Rheumatoid arthritis, unspecified: Secondary | ICD-10-CM

## 2020-10-21 DIAGNOSIS — F1190 Opioid use, unspecified, uncomplicated: Secondary | ICD-10-CM

## 2020-10-21 MED ORDER — HYDROCODONE-ACETAMINOPHEN 10-325 MG PO TABS: 1.0000 | ORAL_TABLET | Freq: Four times a day (QID) | ORAL | 0 refills | Status: DC | PRN

## 2020-10-21 NOTE — Chronic Care Management (AMB) (Signed)
Chronic Care Management Pharmacy Assistant   Name: EMORI MUMME  MRN: [id number] DOB: 1956/05/11  Reason for Encounter: Disease State/ Hypertension   Conditions to be addressed/monitored: HTN   Recent office visits:   10/05/20 Erasmo Downer LPN [facility]) virtual visit for medicare wellness exam. No medication changes. Patient to call schedule physical exam with Dr. Sarajane Jews.   Recent consult visits:   09/13/20 Renato Shin MD ( Endocrinology) - seen for osteoporosis. Administered Prolia injection 60mg . No follow up noted.   Hospital visits:  Medication Reconciliation was completed by comparing discharge summary, patient's EMR and Pharmacy list, and upon discussion with patient.  Admitted to the hospital on 07/04/20 due to Partial small bowel obstruction. Discharge date was 07/09/20. Discharged from [facility]?Medications Started at [facility] Discharge:?? No New Medications started.  Medication Changes at Hospital Discharge: No Medication changes.  Medications Discontinued at Hospital Discharge: None.  Medications that remain the same after Hospital Discharge:??  -All other medications will remain the same.    Medications: Outpatient Encounter Medications as of 10/20/2020  Medication Sig  . Abatacept 125 MG/ML SOSY Inject 125 mg into the skin every Monday. tuesday  . albuterol (VENTOLIN HFA) 108 (90 Base) MCG/ACT inhaler Inhale 1 puff into the lungs every 4 (four) hours as needed for wheezing or shortness of breath.  . allopurinol (ZYLOPRIM) 100 MG tablet TAKE 1 TABLET BY MOUTH EVERY DAY (Patient taking differently: Take 100 mg by mouth daily.)  . amLODipine (NORVASC) 10 MG tablet TAKE 1 TABLET BY MOUTH EVERY DAY  . amoxicillin (AMOXIL) 500 MG capsule Take 1 capsule (500 mg total) by mouth 3 (three) times daily. (Patient not taking: Reported on 10/05/2020)  . aspirin EC 81 MG tablet Take 1 tablet (81 mg total) by mouth daily.  Marland Kitchen CALCIUM PO Take 1  tablet by mouth 2 (two) times daily.  . cetirizine (ZYRTEC) 10 MG tablet Take 1 tablet (10 mg total) by mouth daily.  . Cholecalciferol (VITAMIN D) 2000 units CAPS Take 2,000 Units by mouth daily.  Marland Kitchen denosumab (PROLIA) 60 MG/ML SOSY injection Inject 60 mg into the skin every 6 (six) months.  . diazepam (VALIUM) 2 MG tablet Take 1 tablet (2 mg total) by mouth every 12 (twelve) hours as needed for anxiety.  . diclofenac sodium (VOLTAREN) 1 % GEL Apply 1 application topically 4 (four) times daily as needed (arthritis).  Marland Kitchen docusate sodium (COLACE) 100 MG capsule Take 100 mg by mouth 2 (two) times daily.  Marland Kitchen esomeprazole (NEXIUM) 40 MG capsule Take 1 capsule (40 mg total) by mouth daily in the afternoon.  . folic acid (FOLVITE) 250 MCG tablet Take 400 mcg by mouth daily.  Marland Kitchen HYDROcodone-homatropine (HYCODAN) 5-1.5 MG/5ML syrup Take 5 mLs by mouth every 4 (four) hours as needed for cough. (Patient not taking: Reported on 10/05/2020)  . ketoconazole (NIZORAL) 2 % shampoo Apply 1 application topically 2 (two) times a week.  . megestrol (MEGACE ES) 625 MG/5ML suspension take 5 milliliters by mouth three times daily before meals as needed for appetite (Patient taking differently: Take 625 mg by mouth daily as needed (appetite).)  . Methotrexate Sodium (METHOTREXATE, PF,) 50 MG/2ML injection Inject 0.5 mLs into the muscle once a week. Tues  . Multiple Vitamin (MULTIVITAMIN WITH MINERALS) TABS tablet Take 1 tablet by mouth daily.  Marland Kitchen PERIDEX 0.12 % solution Use as directed 15 mLs in the mouth or throat 2 (two) times daily.   Marland Kitchen  polyethylene glycol (MIRALAX / GLYCOLAX) 17 g packet Take 17 g by mouth daily as needed for mild constipation.  Marland Kitchen tiZANidine (ZANAFLEX) 4 MG tablet TAKE 1 TABLET(4 MG) BY MOUTH TWICE DAILY (Patient taking differently: Take 4 mg by mouth in the morning and at bedtime.)  . traMADol (ULTRAM) 50 MG tablet TAKE 1 TO 2 TABLETS BY MOUTH EVERY 6 HOURS AS NEEDED  . triamcinolone lotion (KENALOG) 0.1  % Apply 1 application topically 2 (two) times daily as needed (itching).  . vitamin B-12 (CYANOCOBALAMIN) 500 MCG tablet Take 500 mcg by mouth daily.  . vitamin C (ASCORBIC ACID) 500 MG tablet Take 500 mg by mouth daily.    No facility-administered encounter medications on file as of 10/20/2020.   Reviewed chart prior to disease state call. Spoke with patient regarding BP  Recent Office Vitals: BP Readings from Last 3 Encounters:  07/09/20 (!) 141/83  06/20/20 (!) 142/80  05/12/20 140/79   Pulse Readings from Last 3 Encounters:  07/09/20 70  06/20/20 86  05/12/20 78    Wt Readings from Last 3 Encounters:  07/05/20 151 lb 0.2 oz (68.5 kg)  05/07/20 154 lb 1.6 oz (69.9 kg)  10/19/19 145 lb (65.8 kg)     Kidney Function Lab Results  Component Value Date/Time   CREATININE 0.70 07/06/2020 03:18 AM   CREATININE 1.13 (H) 07/05/2020 04:00 AM   CREATININE 0.76 11/19/2017 10:24 AM   GFR 107.53 04/20/2019 12:02 PM   GFRNONAA >60 07/06/2020 03:18 AM   GFRNONAA 85 11/19/2017 10:24 AM   GFRAA 98 11/19/2017 10:24 AM    BMP Latest Ref Rng & Units 07/06/2020 07/05/2020 07/04/2020  Glucose 70 - 99 mg/dL 89 96 116(H)  BUN 8 - 23 mg/dL 15 14 12   Creatinine 0.44 - 1.00 mg/dL 0.70 1.13(H) 1.32(H)  Sodium 135 - 145 mmol/L 146(H) 141 139  Potassium 3.5 - 5.1 mmol/L 3.9 3.7 3.7  Chloride 98 - 111 mmol/L 107 103 100  CO2 22 - 32 mmol/L 26 29 28   Calcium 8.9 - 10.3 mg/dL 8.2(L) 8.4(L) 9.6    . Current antihypertensive regimen:  o Amlodipine 10mg   - take one daily.  . How often are you checking your Blood Pressure? infrequently  . Current home BP readings: Patient states she has not been checking her BP at home in a while and she has no readings.   . What recent interventions/DTPs have been made by any provider to improve Blood Pressure control since last CPP Visit: None.   . Any recent hospitalizations or ED visits since last visit with CPP? Yes   . What diet changes have been made to  improve Blood Pressure Control?  o Patient states she eats what she wants when she wants.  . What exercise is being done to improve your Blood Pressure Control?  o Patient states she does not get any type of activity and does not try to.   Adherence Review: Is the patient currently on ACE/ARB medication? No Does the patient have >5 day gap between last estimated fill dates? No  Patient stated she can not read her pill bottle and her daughter Lenna Sciara fills and separates her pills into her weekly divider. I reminded patient of her upcoming appointment with [name] the clinical pharmacist over the phone 11/08/20 at 2pm and to have her bottles close by. I advised patient to start checking her BP daily and keep a log. Patient verbalized understanding and thanked me for my call.  Star Rating Drugs: None.   [place] [phone]

## 2020-10-21 NOTE — Progress Notes (Signed)
   Subjective:    Patient ID: [name], female    DOB: 06/04/1956, 65 y.o.   MRN: [id number]  HPI Virtual Visit via Telephone Note  I connected with the patient on 10/21/20 at  2:30 PM EDT by telephone and verified that I am speaking with the correct person using two identifiers.   I discussed the limitations, risks, security and privacy concerns of performing an evaluation and management service by telephone and the availability of in person appointments. I also discussed with the patient that there may be a patient responsible charge related to this service. The patient expressed understanding and agreed to proceed.  Location patient: home Location provider: work or home office Participants present for the call: patient, provider Patient did not have a visit in the prior 7 days to address this/these issue(s).   History of Present Illness: Here for pain management, she is doing well.   Observations/Objective: Patient sounds cheerful and well on the phone. I do not appreciate any SOB. Speech and thought processing are grossly intact. Patient reported vitals:  Assessment and Plan: Pain management. Indication for chronic opioid: RA Medication and dose: Norco 10-325 # pills per month: 120 Last UDS date: 06-21-20 Opioid Treatment Agreement signed (Y/N): 07-12-17 Opioid Treatment Agreement last reviewed with patient:  10-21-20 [facility] reviewed this encounter (include red flags): Yes Meds were refilled. [name], MD   Follow Up Instructions:     [phone] 5-10 [phone] 11-20 [id number] 21-30 I did not refer this patient for an OV in the next 24 hours for this/these issue(s).  I discussed the assessment and treatment plan with the patient. The patient was provided an opportunity to ask questions and all were answered. The patient agreed with the plan and demonstrated an understanding of the instructions.   The patient was advised to call back or seek an in-person evaluation if  the symptoms worsen or if the condition fails to improve as anticipated.  I provided 12 minutes of non-face-to-face time during this encounter.   [name], MD    Review of Systems     Objective:   Physical Exam        Assessment & Plan:

## 2020-10-22 DIAGNOSIS — Z933 Colostomy status: Secondary | ICD-10-CM | POA: Diagnosis not present

## 2020-11-07 ENCOUNTER — Telehealth: Payer: Self-pay | Admitting: Pharmacist

## 2020-11-07 NOTE — Chronic Care Management (AMB) (Signed)
I spoke with the patient about her upcoming appointment on 11/08/2020 @ 2:00 pm with the clinical pharmacist. She was asked to please have all medication on hand to review with the pharmacist. She confirmed the appointment.   [name], [place] [phone]

## 2020-11-08 ENCOUNTER — Ambulatory Visit (INDEPENDENT_AMBULATORY_CARE_PROVIDER_SITE_OTHER): Payer: Medicare Other | Admitting: Pharmacist

## 2020-11-08 ENCOUNTER — Other Ambulatory Visit: Payer: Self-pay | Admitting: Family Medicine

## 2020-11-08 DIAGNOSIS — K219 Gastro-esophageal reflux disease without esophagitis: Secondary | ICD-10-CM

## 2020-11-08 DIAGNOSIS — E785 Hyperlipidemia, unspecified: Secondary | ICD-10-CM

## 2020-11-08 DIAGNOSIS — I10 Essential (primary) hypertension: Secondary | ICD-10-CM

## 2020-11-08 NOTE — Progress Notes (Signed)
Chronic Care Management Pharmacy Note  11/08/20 Name:  [name] MRN:  [id number] DOB:  08/06/1955  Subjective: [name] is an 65 y.o. year old female who is a primary patient of [name], MD.  The CCM team was consulted for assistance with disease management and care coordination needs.    Engaged with patient by telephone for follow up visit in response to provider referral for pharmacy case management and/or care coordination services.   Consent to Services:  The patient was given information about Chronic Care Management services, agreed to services, and gave verbal consent prior to initiation of services.  Please see initial visit note for detailed documentation.   Patient Care Team: [name], MD as PCP - General Tamala Julian Lynnell Dike, MD as PCP - Cardiology (Cardiology) Gatha Mayer, MD as Consulting Physician (Gastroenterology) Gavin Pound, MD as Consulting Physician (Rheumatology) Viona Gilmore, [facility] as Pharmacist (Pharmacist)  Recent office visits: 10/21/20 [name], MD: Patient presented for video visit for pain management.  10/05/20 Charlott Rakes, LPN: Patient presented for AWV.  07/22/20 [name], MD: Patient presented for video visit for pain management.  07/14/20 [name], MD: Patient presented for video visit for dental abscess and hospital follow up. Prescribed amoxicillin x 10 days.  06/20/20 [name], MD: Patient presented for chronic conditions follow up. Influenza vaccine administered.  Recent consult visits: 09/13/20 Renato Shin, MD (endo): Patient presented for osteoporosis follow up. Prolia injection administered.  10/19/2019- Cardiology- Cecilie Kicks, RN- patient presented for virtual visit for atrial flutter. Suspicion of Marfan's syndrome. ECHO was stable and patient was euvolemic. No awareness of irregular HR and HTN was controlled. Patient to follow up in person in 6 months.   Hospital visits: 1/10-1/15/22 Patient  admitted for COVID infection and partial small bowel obstruction.   Objective:  Lab Results  Component Value Date   CREATININE 0.70 01/16/2021   BUN 15 07/06/2020   GFR 107.53 04/20/2019   GFRNONAA >60 07/06/2020   GFRAA 98 11/19/2017   NA 146 (H) 07/06/2020   K 3.9 07/06/2020   CALCIUM 9.8 12/20/2020   CO2 26 07/06/2020   GLUCOSE 89 07/06/2020    Lab Results  Component Value Date/Time   HGBA1C 5.6 06/09/2015 08:30 PM   HGBA1C 5.6 09/26/2009 03:53 PM   GFR 107.53 04/20/2019 12:02 PM   GFR 103.39 12/20/2014 11:31 AM    Last diabetic Eye exam: No results found for: HMDIABEYEEXA  Last diabetic Foot exam: No results found for: HMDIABFOOTEX   Lab Results  Component Value Date   CHOL 238 (H) 06/20/2020   HDL 76.10 06/20/2020   LDLCALC 145 (H) 06/20/2020   TRIG 86.0 06/20/2020   CHOLHDL 3 06/20/2020    Hepatic Function Latest Ref Rng & Units 07/06/2020 07/05/2020 07/04/2020  Total Protein 6.5 - 8.1 g/dL 6.5 6.5 7.3  Albumin 3.5 - 5.0 g/dL 3.4(L) 3.5 4.0  AST 15 - 41 U/L 35 28 32  ALT 0 - 44 U/L '25 23 26  ' Alk Phosphatase 38 - 126 U/L 65 68 79  Total Bilirubin 0.3 - 1.2 mg/dL 0.6 0.6 0.8  Bilirubin, Direct 0.1 - 0.5 mg/dL - - -    Lab Results  Component Value Date/Time   TSH 1.93 06/20/2020 11:42 AM   TSH 1.31 12/21/2019 03:04 PM   FREET4 0.94 06/20/2020 11:42 AM    CBC Latest Ref Rng & Units 07/06/2020 07/05/2020 07/04/2020  WBC 4.0 - 10.5 K/uL 4.2 5.1 7.1  Hemoglobin 12.0 - 15.0 g/dL 12.3 11.6(L) 12.5  Hematocrit 36.0 - 46.0 % 38.5 36.4 38.2  Platelets 150 - 400 K/uL 206 221 264    Lab Results  Component Value Date/Time   VD25OH 74.85 12/20/2020 10:32 AM   VD25OH 66.24 12/21/2019 03:04 PM    Clinical ASCVD: Yes  The ASCVD Risk score (Arnett DK, et al., 2019) failed to calculate for the following reasons:   The patient has a prior MI or stroke diagnosis    Depression screen [facility] 2/9 12/20/2020 10/21/2020 10/05/2020  Decreased Interest 0 0 0  Down, Depressed,  Hopeless 0 1 0  PHQ - 2 Score 0 1 0  Altered sleeping - 0 -  Tired, decreased energy - 0 -  Change in appetite - 0 -  Feeling bad or failure about yourself  - 0 -  Trouble concentrating - 0 -  Moving slowly or fidgety/restless - 0 -  Suicidal thoughts - 0 -  PHQ-9 Score - 1 -  Difficult doing work/chores - Not difficult at all -  Some recent data might be hidden     Social History   Tobacco Use  Smoking Status Former   Packs/day: 1.00   Types: Cigarettes   Quit date: 11/27/2012   Years since quitting: 8.3  Smokeless Tobacco Never   BP Readings from Last 3 Encounters:  01/18/21 119/78  12/20/20 126/78  07/09/20 (!) 141/83   Pulse Readings from Last 3 Encounters:  12/20/20 76  07/09/20 70  06/20/20 86   Wt Readings from Last 3 Encounters:  12/20/20 150 lb (68 kg)  10/21/20 150 lb (68 kg)  07/05/20 151 lb 0.2 oz (68.5 kg)   BMI Readings from Last 3 Encounters:  12/20/20 22.81 kg/m  10/21/20 22.81 kg/m  07/05/20 22.96 kg/m    Assessment/Interventions: Review of patient past medical history, allergies, medications, health status, including review of consultants reports, laboratory and other test data, was performed as part of comprehensive evaluation and provision of chronic care management services.   SDOH:  (Social Determinants of Health) assessments and interventions performed: No  SDOH Screenings   Alcohol Screen: Not on file  Depression (PHQ2-9): Low Risk    PHQ-2 Score: 0  Financial Resource Strain: Low Risk    Difficulty of Paying Living Expenses: Not hard at all  Food Insecurity: No Food Insecurity   Worried About Charity fundraiser in the Last Year: Never true   Ran Out of Food in the Last Year: Never true  Housing: Low Risk    Last Housing Risk Score: 0  Physical Activity: Inactive   Days of Exercise per Week: 0 days   Minutes of Exercise per Session: 0 min  Social Connections: Unknown   Frequency of Communication with Friends and Family: Not on  file   Frequency of Social Gatherings with Friends and Family: Once a week   Attends Religious Services: Never   Marine scientist or Organizations: No   Attends Music therapist: Never   Marital Status: Never married  Stress: No Stress Concern Present   Feeling of Stress : Not at all  Tobacco Use: Medium Risk   Smoking Tobacco Use: Former   Smokeless Tobacco Use: Never  Transportation Needs: No Data processing manager (Medical): No   Lack of Transportation (Non-Medical): No    CCM Care Plan  Allergies  Allergen Reactions   Spinach Itching, Rash and Other (See Comments)    [place].  Medications Reviewed Today     Reviewed by [name], MD (Physician) on 01/18/21 at 1553  Med List Status: <None>   Medication Order Taking? Sig Documenting Provider Last Dose Status Informant  Abatacept 125 MG/ML SOSY [id number] Yes Inject 125 mg into the skin every Monday. tuesday [provider] Taking Active Child           Med Note Juanetta Beets, AMANDA R   Tue Aug 07, 2016  8:11 PM)    albuterol (VENTOLIN HFA) 108 [phone] Base) MCG/ACT inhaler [id number] Yes INHALE 1 PUFF INTO THE LUNGS EVERY 4 HOURS AS NEEDED FOR WHEEZING OR SHORTNESS OF Damian Leavell, MD Taking Active   allopurinol (ZYLOPRIM) 100 MG tablet [id number] Yes TAKE 1 TABLET BY MOUTH EVERY DAY  Patient taking differently: Take 100 mg by mouth daily.   [name], MD Taking Active   amLODipine (NORVASC) 10 MG tablet [id number] Yes TAKE 1 TABLET BY MOUTH EVERY DAY [name], MD Taking Active   aspirin EC 81 MG tablet [id number] Yes Take 1 tablet (81 mg total) by mouth daily. Verlee Monte, MD Taking Active Child  CALCIUM PO [id number] Yes Take 1 tablet by mouth 2 (two) times daily. [provider] Taking Active Child  cetirizine (ZYRTEC) 10 MG tablet [id number] Yes Take 1 tablet (10 mg total) by mouth daily. [name], MD Taking Active Child  Cholecalciferol  (VITAMIN D) 2000 units CAPS [id number] Yes Take 2,000 Units by mouth daily. [provider] Taking Active Child  denosumab (PROLIA) 60 MG/ML SOSY injection [id number] Yes Inject 60 mg into the skin every 6 (six) months. [provider] Taking Active Child  diazepam (VALIUM) 2 MG tablet [id number] Yes Take 1 tablet (2 mg total) by mouth every 12 (twelve) hours as needed for anxiety. [name], MD Taking Active   diclofenac sodium (VOLTAREN) 1 % GEL [id number] Yes Apply 1 application topically 4 (four) times daily as needed (arthritis). [provider] Taking Active Child  docusate sodium (COLACE) 100 MG capsule [id number] Yes Take 100 mg by mouth 2 (two) times daily. [provider] Taking Active Child  esomeprazole (NEXIUM) 40 MG capsule [id number] Yes Take 1 capsule (40 mg total) by mouth daily in the afternoon. [name], MD Taking Active Child  folic acid (FOLVITE) 076 MCG tablet [id number] Yes Take 400 mcg by mouth daily. [provider] Taking Active Child  HYDROcodone-acetaminophen (NORCO) 10-325 MG tablet [id number] Yes Take 1 tablet by mouth every 6 (six) hours as needed for moderate pain. [name], MD Taking Active   ketoconazole (NIZORAL) 2 % shampoo [id number] Yes Apply 1 application topically 2 (two) times a week. [provider] Taking Active Child  megestrol (MEGACE ES) 625 MG/5ML suspension [id number] Yes take 5 milliliters by mouth three times daily before meals as needed for appetite  Patient taking differently: Take 625 mg by mouth daily as needed (appetite).   [name], MD Taking Active   Methotrexate Sodium (METHOTREXATE, PF,) 50 MG/2ML injection [id number] Yes Inject 0.5 mLs into the muscle once a week. Tues [provider] Taking Active Child           Med Note Vonzella Nipple, [place] S   Fri Jun 29, 2016  5:35 PM)    Multiple Vitamin (MULTIVITAMIN WITH MINERALS) TABS tablet [id number] Yes Take 1 tablet by mouth  daily. [provider] Taking Active Child  PERIDEX 0.12 % solution [id number] Yes Use as directed 15  mLs in the mouth or throat 2 (two) times daily.  [provider] Taking Active Child  polyethylene glycol (MIRALAX / GLYCOLAX) 17 g packet [id number] Yes Take 17 g by mouth daily as needed for mild constipation. [provider] Taking Active Child  tiZANidine (ZANAFLEX) 4 MG tablet [id number] Yes TAKE 1 TABLET(4 MG) BY MOUTH TWICE DAILY  Patient taking differently: Take 4 mg by mouth in the morning and at bedtime.   [name], MD Taking Active   traMADol Veatrice Bourbon) 50 MG tablet [id number] Yes TAKE 1 TO 2 TABLETS BY MOUTH EVERY 6 HOURS AS NEEDED [name], MD Taking Active   triamcinolone lotion (KENALOG) 0.1 % [id number] Yes Apply 1 application topically 2 (two) times daily as needed (itching). [provider] Taking Active Child  vitamin B-12 (CYANOCOBALAMIN) 500 MCG tablet [id number] Yes Take 500 mcg by mouth daily. [provider] Taking Active Child  vitamin C (ASCORBIC ACID) 500 MG tablet [id number] Yes Take 500 mg by mouth daily.  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   Chronic diastolic CHF (congestive heart failure) ([place])    Ventral hernia without obstruction or gangrene 06/06/2015   Renal mass    Pulmonary HTN ([place]) 01/01/2014   AKI (acute kidney injury) ([place]) 01/01/2014   Iron deficiency anemia 01/01/2014   Atrial flutter ([place]) 12/31/2013   Encounter for ostomy care education 11/30/2013   Stoma dermatitis 11/30/2013   Incisional hernia 11/30/2013    Constipation, chronic 11/30/2013   Charcot's joint of foot 07/07/2013   Bilateral leg edema 04/20/2013   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 11/28/2012 01/05/2013   Rheumatoid arthritis ([place]) 06/07/2009   THYROID NODULE 07/01/2007   LUNG NODULE 07/01/2007   Osteoporosis 05/07/2007   ABUSE, ALCOHOL, 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/12/2007   Blindness 04/12/2007   HEMORRHOIDS, INTERNAL 04/12/2007   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/14/2007   Hyperlipidemia 01/07/2007   Essential hypertension 01/07/2007   GERD 01/07/2007   Marfan's syndrome 01/07/2007   Stroke ([place]) 07/2006   Chronic gout 2008    Immunization History  Administered Date(s) Administered   DTaP 10/07/2005   Fluad Quad(high Dose 65+) 04/20/2019   Influenza Split 04/30/2011, 04/16/2012   Influenza Whole 04/25/2002, 04/03/2010   Influenza,inj,Quad PF,6+ Mos 03/23/2013, 05/09/2015, 04/15/2017, 04/21/2018, 06/20/2020   Influenza-Unspecified 04/26/2014   Pneumococcal Conjugate-13 07/18/2015   Pneumococcal Polysaccharide-23 04/25/2002, 04/03/2010   Tdap 09/04/2010    Conditions to be addressed/monitored:  Hypertension, Hyperlipidemia, GERD, Osteoporosis, Gout and RA and pain  Care Plan : [place]  Updates made by Viona Gilmore, [place] since 04/11/2021 12:00 AM     Problem: Problem: Hypertension, Hyperlipidemia, GERD, Osteoporosis, Gout and RA and pain      Long-Range Goal: Patient-Specific Goal   Start Date: 11/08/2020  Expected End Date: 11/08/2021  This Visit's Progress: On track  Priority: High  Note:   Current Barriers:  Unable to independently monitor therapeutic efficacy Unable to achieve control of cholesterol   Pharmacist Clinical Goal(s):  Patient will achieve adherence to monitoring guidelines and medication adherence to achieve therapeutic efficacy achieve  control of cholesterol as evidenced by next lipid panel  through collaboration with PharmD and provider.   Interventions: 1:1  collaboration with [name], MD regarding development and update of comprehensive plan of care as evidenced by provider attestation and co-signature Inter-disciplinary care team collaboration (see longitudinal plan of care) Comprehensive medication review performed; medication list updated in electronic medical record  Hypertension (BP goal <130/80) -Uncontrolled -Current treatment: Amlodipine 76m,1 tablet once daily  -Medications previously tried: metoprolol (patient preference)  -Current home readings: recommended checking once BP; wrist BP cuff - she bought an arm cuff but it is too big for her arm -Current dietary habits: did not discuss -Current exercise habits: limited -Denies hypotensive/hypertensive symptoms -Educated on BP goals and benefits of medications for prevention of heart attack, stroke and kidney damage; Importance of home blood pressure monitoring; Proper BP monitoring technique; -Counseled to monitor BP at home weekly, document, and provide log at future appointments -Counseled on diet and exercise extensively Recommended to continue current medication  Hyperlipidemia: (LDL goal < 70) -Uncontrolled -Current treatment: No medications -Medications previously tried: simvastatin (myalgias)   -Current dietary patterns: did not discuss -Current exercise habits: limited -Educated on Cholesterol goals;  Benefits of statin for ASCVD risk reduction; Importance of limiting foods high in cholesterol; -Recommended starting statin therapy based on history of TIA and LDL goal < 70.  History of TIA (Goal: prevent future events) -Uncontrolled -Current treatment  Aspirin 827m 1 tablet once daily -Medications previously tried: none  -Recommended to continue current medication   Osteoporosis (Goal prevent fractures) -Not ideally controlled -Last DEXA Scan: 10/05/19  T-Score femoral neck: -1.9, -2.9  T-Score total hip: n/a  T-Score lumbar spine: n/a  T-Score  forearm radius: -4.2  10-year probability of major osteoporotic fracture: n/a  10-year probability of hip fracture: n/a -Patient is a candidate for pharmacologic treatment due to T-Score < -2.5 in femoral neck -Current treatment  Prolia injection every 6 months (started 02/2020) - last injection  Cholecalciferol (vitamin D) 2000 units, 1 capsule once daily Calcium 1200 mg, 1 tablet twice daily -Medications previously tried: none  -Recommend [phone] units of vitamin D daily. Recommend 1200 mg of calcium daily from dietary and supplemental sources. Recommend weight-bearing and muscle strengthening exercises for building and maintaining bone density. -Counseled on diet and exercise extensively Recommended to continue current medication Recommended reminding dentist about Prolia therapy.  Gout (Goal: prevent flare ups and uric acid < 6) -Controlled -Current treatment  Allopurinol 10033m1 tablet once daily  -Medications previously tried: none  -Recommended to continue current medication  GERD (Goal: minimize symptoms) -Controlled -Current treatment  Esomeprazole 55m20m capsule once daily -Medications previously tried: none  -Counseled on non-pharmacological interventions for acid reflux. Take measures to prevent acid reflux, such as avoiding spicy foods, avoiding caffeine, avoid laying down a few hours after eating, and raising the head of the bed.  Rheumatoid arthritis (Goal: minimize symptoms) -Controlled -Current treatment  Methotrexate injection, inject 0.5MLs into muscle every Monday  Folic acid 400m762UQJtablet once daily Orencia 125mg41mject 125mg 1my Monday  -Medications previously tried: n/a  -Recommended to continue current medication  Pain (Goal: minimize pain) -Not ideally controlled -Current treatment  Hydrocodone/ APAP 10/325mg, 73mblet every six hours as needed for moderate pain (will take if tramadol doesn't work)  Diclofenac 1% gel, apply topically four  times daily as needed Tramadol 50mg, 124m2 tablets every six hours as needed  (takes at least 2x/ day) Tizanidine 4mg,37m  1 tablet twice daily  -Medications previously tried: n/a  -Recommended to continue current medication   Health Maintenance -Vaccine gaps: shingles, tetanus, COVID  -Current therapy:  Cetirizine 20m, 1 tablet once daily Docusate 1058m 1 capsule twice daily  keoconazole 2% cream, apply once daily for irritation Metoclopramide 1052m tablet every six hours  Multivitamin tablets, 1 tablet once daily Miralax, 17 g daily as needed for constipation ( not everyday bc does not have someone to help change bag)  Triamcinolone 00025% cream, apply daily as needed for itching Vitamin B12 500m51m1 tablet once daily Vitamin C 500mg62mtablet once daily  -Educated on Cost vs benefit of each product must be carefully weighed by individual consumer -Patient is satisfied with current therapy and denies issues -Recommended to continue current medication  Patient Goals/Self-Care Activities Patient will:  - take medications as prescribed check blood pressure weekly, document, and provide at future appointments target a minimum of 150 minutes of moderate intensity exercise weekly  Follow Up Plan: Telephone follow up appointment with care management team member scheduled for: 6 months       Medication Assistance: None required.  Patient affirms current coverage meets needs.  Patient's preferred pharmacy is:  [phone]: [id number] [place]: [phone] pill box? Yes  Pt endorses 99% compliance  We discussed: Benefits of medication  synchronization, packaging and delivery as well as enhanced pharmacist oversight with [employer]. Patient decided to: Continue current medication management strategy  Care Plan and Follow Up Patient Decision:  Patient agrees to Care Plan and Follow-up.  Plan: Telephone follow up appointment with care management team member scheduled for:  6 months  MadelJeni SallesrmD BCACPAtenmacist LeBauHorton BayrassMount [phone]

## 2020-11-16 DIAGNOSIS — M0549 Rheumatoid myopathy with rheumatoid arthritis of multiple sites: Secondary | ICD-10-CM | POA: Diagnosis not present

## 2020-11-22 DIAGNOSIS — E785 Hyperlipidemia, unspecified: Secondary | ICD-10-CM

## 2020-11-22 DIAGNOSIS — I10 Essential (primary) hypertension: Secondary | ICD-10-CM | POA: Diagnosis not present

## 2020-11-22 DIAGNOSIS — Z933 Colostomy status: Secondary | ICD-10-CM | POA: Diagnosis not present

## 2020-11-28 DIAGNOSIS — M0579 Rheumatoid arthritis with rheumatoid factor of multiple sites without organ or systems involvement: Secondary | ICD-10-CM | POA: Diagnosis not present

## 2020-12-07 ENCOUNTER — Other Ambulatory Visit: Payer: Self-pay | Admitting: Interventional Radiology

## 2020-12-07 DIAGNOSIS — N2889 Other specified disorders of kidney and ureter: Secondary | ICD-10-CM

## 2020-12-12 ENCOUNTER — Other Ambulatory Visit: Payer: Self-pay | Admitting: *Deleted

## 2020-12-12 DIAGNOSIS — C649 Malignant neoplasm of unspecified kidney, except renal pelvis: Secondary | ICD-10-CM

## 2020-12-14 DIAGNOSIS — M0549 Rheumatoid myopathy with rheumatoid arthritis of multiple sites: Secondary | ICD-10-CM | POA: Diagnosis not present

## 2020-12-20 ENCOUNTER — Ambulatory Visit (INDEPENDENT_AMBULATORY_CARE_PROVIDER_SITE_OTHER): Payer: Medicare Other | Admitting: Endocrinology

## 2020-12-20 ENCOUNTER — Other Ambulatory Visit: Payer: Self-pay

## 2020-12-20 VITALS — BP 126/78 | HR 76 | Ht 68.0 in | Wt 150.0 lb

## 2020-12-20 DIAGNOSIS — M810 Age-related osteoporosis without current pathological fracture: Secondary | ICD-10-CM

## 2020-12-20 LAB — VITAMIN D 25 HYDROXY (VIT D DEFICIENCY, FRACTURES): VITD: 74.85 ng/mL (ref 30.00–100.00)

## 2020-12-20 NOTE — Progress Notes (Signed)
Subjective:    Patient ID: [name], female    DOB: [birth year], 65 y.o.   MRN: [id number]  HPI Pt returns for f/u of osteoporosis: Dx'ed: 2008 Secondary cause: intermitt prednisone x 7 years, but none recently, and menopause at age 9; quit smoking in 2014, after many years. Fractures: C-spine (1986, with a fall) and left foot (2018, dropped object on it). Past rx: actonel 2010-2017.  Current rx: Prolia Last DEXA result (2021): worst T-score is at right radius: -4.4 Other: RA severely limits mobility.  Interval hx: Pt says dentist had advised her to d/c prolia for dental procedure 12/22.   pt states she feels well in general.  Last prolia dose was 3/22.   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   bilateral     Past Surgical History:  Procedure Laterality Date   ABLATION  01/04/14   atrial flutter ablation (2 circuits) by Dr Lovena Le   ATRIAL FLUTTER ABLATION N/A 01/04/2014   Procedure: ATRIAL FLUTTER ABLATION;  Surgeon: Evans Lance, MD;  Location: [facility] CATH LAB;  Service: Cardiovascular;   Laterality: N/A;   BUNIONECTOMY Bilateral 1/10   COLON SURGERY     colectomy   COLOSTOMY  11/27/2012   HERNIA REPAIR     IR RADIOLOGIST EVAL & MGMT  10/01/2017   IR RADIOLOGIST EVAL & MGMT  11/21/2017   IR RADIOLOGIST EVAL & MGMT  02/13/2018   IR RADIOLOGIST EVAL & MGMT  01/08/2019   IR RADIOLOGIST EVAL & MGMT  12/29/2019   LAPAROSCOPIC ABDOMINAL EXPLORATION N/A 01/06/2013   Procedure: LAPAROSCOPIC ABDOMINAL EXPLORATION;  Surgeon: Adin Hector, MD;  Location: [facility] ORS;  Service: General;  Laterality: N/A;   LAPAROSCOPIC LYSIS OF ADHESIONS N/A 01/06/2013   Procedure: LAPAROSCOPIC LYSIS OF ADHESIONS/ INTEROTOMY REPAIR;  Surgeon: Adin Hector, MD;  Location: [facility] ORS;  Service: General;  Laterality: N/A;   LAPAROTOMY N/A 11/27/2012   Procedure: EXPLORATORY LAPAROTOMY SIGMOID COLECTOMY, COLOSTOMY;  Surgeon: Madilyn Hook, DO;  Location: [facility] ORS;  Service: General;  Laterality: N/A;   RADIOLOGY WITH ANESTHESIA Left 10/23/2017   Procedure: CT RENAL CRYO AND BIOPSY;  Surgeon: Aletta Edouard, MD;  Location: [facility] ORS;  Service: Radiology;  Laterality: Left;   TOTAL KNEE ARTHROPLASTY Bilateral 6/08    Social History   Socioeconomic History   Marital status: Single    Spouse name: Not on file  Number of children: 1   Years of education: Not on file   Highest education level: Not on file  Occupational History   Not on file  Tobacco Use   Smoking status: Former    Packs/day: 1.00    Pack years: 0.00    Types: Cigarettes    Quit date: 11/27/2012    Years since quitting: 8.0   Smokeless tobacco: Never  Vaping Use   Vaping Use: Former  Substance and Sexual Activity   Alcohol use: No    Alcohol/week: 0.0 standard drinks    Comment: recovering alcoholic sober since 2536   Drug use: No   Sexual activity: Not Currently    Partners: Male    Birth control/protection: None  Other Topics Concern   Not on file  Social History Narrative   Not on file   Social Determinants of Health   Financial Resource  Strain: Low Risk    Difficulty of Paying Living Expenses: Not hard at all  Food Insecurity: No Food Insecurity   Worried About Charity fundraiser in the Last Year: Never true   [place] in the Last Year: Never true  Transportation Needs: No Transportation Needs   Lack of Transportation (Medical): No   Lack of Transportation (Non-Medical): No  Physical Activity: Inactive   Days of Exercise per Week: 0 days   Minutes of Exercise per Session: 0 min  Stress: No Stress Concern Present   Feeling of Stress : Not at all  Social Connections: Unknown   Frequency of Communication with Friends and Family: Not on file   Frequency of Social Gatherings with Friends and Family: Once a week   Attends Religious Services: Never   Marine scientist or Organizations: No   Attends Music therapist: Never   Marital Status: Never married  Human resources officer Violence: Not At Risk   Fear of Current or Ex-Partner: No   Emotionally Abused: No   Physically Abused: No   Sexually Abused: No    Current Outpatient Medications on File Prior to Visit  Medication Sig Dispense Refill   Abatacept 125 MG/ML SOSY Inject 125 mg into the skin every Monday. tuesday     albuterol (VENTOLIN HFA) 108 (90 Base) MCG/ACT inhaler INHALE 1 PUFF INTO THE LUNGS EVERY 4 HOURS AS NEEDED FOR WHEEZING OR SHORTNESS OF BREATH 8.5 g 11   allopurinol (ZYLOPRIM) 100 MG tablet TAKE 1 TABLET BY MOUTH EVERY DAY (Patient taking differently: Take 100 mg by mouth daily.) 90 tablet 0   amLODipine (NORVASC) 10 MG tablet TAKE 1 TABLET BY MOUTH EVERY DAY 90 tablet 1   aspirin EC 81 MG tablet Take 1 tablet (81 mg total) by mouth daily.     CALCIUM PO Take 1 tablet by mouth 2 (two) times daily.     cetirizine (ZYRTEC) 10 MG tablet Take 1 tablet (10 mg total) by mouth daily. 90 tablet 1   Cholecalciferol (VITAMIN D) 2000 units CAPS Take 2,000 Units by mouth daily.     denosumab (PROLIA) 60 MG/ML SOSY injection Inject 60 mg into  the skin every 6 (six) months.     diazepam (VALIUM) 2 MG tablet Take 1 tablet (2 mg total) by mouth every 12 (twelve) hours as needed for anxiety. 60 tablet 5   diclofenac sodium (VOLTAREN) 1 % GEL Apply 1 application topically 4 (four) times daily as needed (arthritis).     docusate sodium (COLACE) 100 MG capsule Take 100 mg  by mouth 2 (two) times daily.     esomeprazole (NEXIUM) 40 MG capsule Take 1 capsule (40 mg total) by mouth daily in the afternoon. 90 capsule 3   folic acid (FOLVITE) 536 MCG tablet Take 400 mcg by mouth daily.     HYDROcodone-acetaminophen (NORCO) 10-325 MG tablet Take 1 tablet by mouth every 6 (six) hours as needed for moderate pain. 120 tablet 0   ketoconazole (NIZORAL) 2 % shampoo Apply 1 application topically 2 (two) times a week.     megestrol (MEGACE ES) 625 MG/5ML suspension take 5 milliliters by mouth three times daily before meals as needed for appetite (Patient taking differently: Take 625 mg by mouth daily as needed (appetite).) 150 mL 5   Methotrexate Sodium (METHOTREXATE, PF,) 50 MG/2ML injection Inject 0.5 mLs into the muscle once a week. Tues  0   Multiple Vitamin (MULTIVITAMIN WITH MINERALS) TABS tablet Take 1 tablet by mouth daily.     PERIDEX 0.12 % solution Use as directed 15 mLs in the mouth or throat 2 (two) times daily.      polyethylene glycol (MIRALAX / GLYCOLAX) 17 g packet Take 17 g by mouth daily as needed for mild constipation.     tiZANidine (ZANAFLEX) 4 MG tablet TAKE 1 TABLET(4 MG) BY MOUTH TWICE DAILY (Patient taking differently: Take 4 mg by mouth in the morning and at bedtime.) 90 tablet 5   traMADol (ULTRAM) 50 MG tablet TAKE 1 TO 2 TABLETS BY MOUTH EVERY 6 HOURS AS NEEDED 180 tablet 5   triamcinolone lotion (KENALOG) 0.1 % Apply 1 application topically 2 (two) times daily as needed (itching).     vitamin B-12 (CYANOCOBALAMIN) 500 MCG tablet Take 500 mcg by mouth daily.     vitamin C (ASCORBIC ACID) 500 MG tablet Take 500 mg by mouth daily.       No current facility-administered medications on file prior to visit.    Allergies  Allergen Reactions   Spinach Itching, Rash and Other (See Comments)    Welts.    Family History  Problem Relation Age of Onset   Heart attack Neg Hx    Colon cancer Neg Hx    Osteoporosis Neg Hx     BP 126/78   Pulse 76   Ht 5\' 8"  (1.727 m)   Wt 150 lb (68 kg)   LMP 06/25/2006 (LMP Unknown)   SpO2 97%   BMI 22.81 kg/m   Review of Systems     Objective:   Physical Exam GENERAL: no distress.  In [place].   NECK: There is no palpable thyroid enlargement.  No thyroid nodule is palpable.  No palpable lymphadenopathy at the anterior neck.     Lab Results  Component Value Date   TSH 1.93 06/20/2020       Assessment & Plan:  MNG: non-palpable.  We'll follow Osteoporosis, due for recheck. Dental disease: in my opinion, it is OK to have dental procedure on the Prolia, but it is also OK to delay Prolia until after that.   Patient Instructions  Let's recheck the bone density.  you will receive a phone call, about a day and time for an appointment.  Blood tests are requested for you today.  We'll let you know about the results.   Please take calcium 1200 mg per day, and Vitamin-D, 2000 units per day.   To have the dental work in December, please delay the next Prolia shot until after then.  Please come back for  a follow-up appointment in 1 year.

## 2020-12-20 NOTE — Patient Instructions (Addendum)
Let's recheck the bone density.  you will receive a phone call, about a day and time for an appointment.  Blood tests are requested for you today.  We'll let you know about the results.   Please take calcium 1200 mg per day, and Vitamin-D, 2000 units per day.   To have the dental work in December, please delay the next Prolia shot until after then.  Please come back for a follow-up appointment in 1 year.

## 2020-12-21 DIAGNOSIS — Z933 Colostomy status: Secondary | ICD-10-CM | POA: Diagnosis not present

## 2020-12-21 LAB — PTH, INTACT AND CALCIUM
Calcium: 9.8 mg/dL (ref 8.6–10.4)
PTH: 16 pg/mL (ref 16–77)

## 2020-12-28 ENCOUNTER — Telehealth: Payer: Self-pay | Admitting: Pharmacist

## 2020-12-28 NOTE — Chronic Care Management (AMB) (Signed)
Chronic Care Management Pharmacy Assistant   Name: [name]  MRN: [id number] DOB: 08-03-1955  Reason for Encounter: General Adherence Call       Recent office visits:  None  Recent consult visits:  06.28.2022 Renato Shin, MD Endocrinology seen for follow-up seen for Osteoporosis without current pathological fracture  Hospital visits:  None in previous 6 months  Medications: Outpatient Encounter Medications as of 12/28/2020  Medication Sig   Abatacept 125 MG/ML SOSY Inject 125 mg into the skin every Monday. tuesday   albuterol (VENTOLIN HFA) 108 (90 Base) MCG/ACT inhaler INHALE 1 PUFF INTO THE LUNGS EVERY 4 HOURS AS NEEDED FOR WHEEZING OR SHORTNESS OF BREATH   allopurinol (ZYLOPRIM) 100 MG tablet TAKE 1 TABLET BY MOUTH EVERY DAY (Patient taking differently: Take 100 mg by mouth daily.)   amLODipine (NORVASC) 10 MG tablet TAKE 1 TABLET BY MOUTH EVERY DAY   aspirin EC 81 MG tablet Take 1 tablet (81 mg total) by mouth daily.   CALCIUM PO Take 1 tablet by mouth 2 (two) times daily.   cetirizine (ZYRTEC) 10 MG tablet Take 1 tablet (10 mg total) by mouth daily.   Cholecalciferol (VITAMIN D) 2000 units CAPS Take 2,000 Units by mouth daily.   denosumab (PROLIA) 60 MG/ML SOSY injection Inject 60 mg into the skin every 6 (six) months.   diazepam (VALIUM) 2 MG tablet Take 1 tablet (2 mg total) by mouth every 12 (twelve) hours as needed for anxiety.   diclofenac sodium (VOLTAREN) 1 % GEL Apply 1 application topically 4 (four) times daily as needed (arthritis).   docusate sodium (COLACE) 100 MG capsule Take 100 mg by mouth 2 (two) times daily.   esomeprazole (NEXIUM) 40 MG capsule Take 1 capsule (40 mg total) by mouth daily in the afternoon.   folic acid (FOLVITE) 528 MCG tablet Take 400 mcg by mouth daily.   HYDROcodone-acetaminophen (NORCO) 10-325 MG tablet Take 1 tablet by mouth every 6 (six) hours as needed for moderate pain.   ketoconazole (NIZORAL) 2 % shampoo Apply 1 application  topically 2 (two) times a week.   megestrol (MEGACE ES) 625 MG/5ML suspension take 5 milliliters by mouth three times daily before meals as needed for appetite (Patient taking differently: Take 625 mg by mouth daily as needed (appetite).)   Methotrexate Sodium (METHOTREXATE, PF,) 50 MG/2ML injection Inject 0.5 mLs into the muscle once a week. Tues   Multiple Vitamin (MULTIVITAMIN WITH MINERALS) TABS tablet Take 1 tablet by mouth daily.   PERIDEX 0.12 % solution Use as directed 15 mLs in the mouth or throat 2 (two) times daily.    polyethylene glycol (MIRALAX / GLYCOLAX) 17 g packet Take 17 g by mouth daily as needed for mild constipation.   tiZANidine (ZANAFLEX) 4 MG tablet TAKE 1 TABLET(4 MG) BY MOUTH TWICE DAILY (Patient taking differently: Take 4 mg by mouth in the morning and at bedtime.)   traMADol (ULTRAM) 50 MG tablet TAKE 1 TO 2 TABLETS BY MOUTH EVERY 6 HOURS AS NEEDED   triamcinolone lotion (KENALOG) 0.1 % Apply 1 application topically 2 (two) times daily as needed (itching).   vitamin B-12 (CYANOCOBALAMIN) 500 MCG tablet Take 500 mcg by mouth daily.   vitamin C (ASCORBIC ACID) 500 MG tablet Take 500 mg by mouth daily.    No facility-administered encounter medications on file as of 12/28/2020.   I spoke with the patient about medication adherence. She states that she is not having any issues with her  current medications. She states she is not experiencing any side effects from her medications. She stated that she did have some questions about her cholesterol medication that she would discuss with her PCP. She stated that she was having some issues with her hands and is trying to get new braces for them. She does have a rheumatologist appointment set up for August 2022. There have been no recent hospital visits emergency department or urgent care visits. She currently uses [facility].  Care Gaps: Foot Exam Eye Exam Urine Microalumin Hep C screening Zoster Vaccine TDAP  Star  Rating Drugs: None  Amilia Revonda Standard, [facility] Pharmacist Assistant [phone]

## 2021-01-10 DIAGNOSIS — M0549 Rheumatoid myopathy with rheumatoid arthritis of multiple sites: Secondary | ICD-10-CM | POA: Diagnosis not present

## 2021-01-16 ENCOUNTER — Ambulatory Visit (HOSPITAL_COMMUNITY)
Admission: RE | Admit: 2021-01-16 | Discharge: 2021-01-16 | Disposition: A | Discharge status: Home / Self Care | Payer: Medicare Other | Source: Ambulatory Visit | Attending: Interventional Radiology | Admitting: Interventional Radiology

## 2021-01-16 ENCOUNTER — Other Ambulatory Visit: Payer: Self-pay

## 2021-01-16 DIAGNOSIS — K8689 Other specified diseases of pancreas: Secondary | ICD-10-CM | POA: Diagnosis not present

## 2021-01-16 DIAGNOSIS — K7689 Other specified diseases of liver: Secondary | ICD-10-CM | POA: Diagnosis not present

## 2021-01-16 DIAGNOSIS — C641 Malignant neoplasm of right kidney, except renal pelvis: Secondary | ICD-10-CM | POA: Diagnosis not present

## 2021-01-16 DIAGNOSIS — K439 Ventral hernia without obstruction or gangrene: Secondary | ICD-10-CM | POA: Diagnosis not present

## 2021-01-16 DIAGNOSIS — N2889 Other specified disorders of kidney and ureter: Secondary | ICD-10-CM | POA: Insufficient documentation

## 2021-01-16 LAB — POCT I-STAT CREATININE: Creatinine, Ser: 0.7 mg/dL (ref 0.44–1.00)

## 2021-01-16 MED ORDER — IOHEXOL 350 MG/ML SOLN
100.0000 mL | Freq: Once | INTRAVENOUS | Status: AC | PRN
  Administered 2021-01-16: 100 mL via INTRAVENOUS

## 2021-01-18 ENCOUNTER — Encounter: Payer: Self-pay | Admitting: Family Medicine

## 2021-01-18 ENCOUNTER — Telehealth (INDEPENDENT_AMBULATORY_CARE_PROVIDER_SITE_OTHER): Payer: Medicare Other | Admitting: Family Medicine

## 2021-01-18 VITALS — BP 119/78 | Temp 98.1°F

## 2021-01-18 DIAGNOSIS — F1190 Opioid use, unspecified, uncomplicated: Secondary | ICD-10-CM | POA: Diagnosis not present

## 2021-01-18 DIAGNOSIS — M069 Rheumatoid arthritis, unspecified: Secondary | ICD-10-CM | POA: Diagnosis not present

## 2021-01-18 MED ORDER — HYDROCODONE-ACETAMINOPHEN 10-325 MG PO TABS: 1.0000 | ORAL_TABLET | Freq: Four times a day (QID) | ORAL | 0 refills | Status: DC | PRN

## 2021-01-18 MED ORDER — TRAMADOL HCL 50 MG PO TABS: 50.0000 mg | ORAL_TABLET | Freq: Four times a day (QID) | ORAL | 5 refills | Status: DC | PRN

## 2021-01-18 MED ORDER — TRIAMCINOLONE ACETONIDE 0.1 % EX LOTN: 1.0000 "application " | TOPICAL_LOTION | Freq: Two times a day (BID) | CUTANEOUS | 5 refills | Status: DC | PRN

## 2021-01-18 MED ORDER — HYDROCODONE-ACETAMINOPHEN 10-325 MG PO TABS: 1.0000 | ORAL_TABLET | Freq: Four times a day (QID) | ORAL | 0 refills | Status: AC | PRN

## 2021-01-18 NOTE — Progress Notes (Signed)
   Subjective:    Patient ID: [name], female    DOB: 1956-06-05, 65 y.o.   MRN: [id number]  HPI Virtual Visit via Telephone Note  I connected with the patient on 01/18/21 at  3:45 PM EDT by telephone and verified that I am speaking with the correct person using two identifiers.   I discussed the limitations, risks, security and privacy concerns of performing an evaluation and management service by telephone and the availability of in person appointments. I also discussed with the patient that there may be a patient responsible charge related to this service. The patient expressed understanding and agreed to proceed.  Location patient: home Location provider: work or home office Participants present for the call: patient, provider Patient did not have a visit in the prior 7 days to address this/these issue(s).   History of Present Illness: Here for pain management, she is doing well.    Observations/Objective: Patient sounds cheerful and well on the phone. I do not appreciate any SOB. Speech and thought processing are grossly intact. Patient reported vitals:  Assessment and Plan: Pain management.  Indication for chronic opioid: RA Medication and dose: Norco 10-325 # pills per month: 120 Last UDS date: 06-21-20 Opioid Treatment Agreement signed (Y/N): 07-12-17 Opioid Treatment Agreement last reviewed with patient:  01-18-21 [facility] reviewed this encounter (include red flags): Yes   Follow Up Instructions:     [id number] [id number] [id number] 11-20 [id number] 21-30 I did not refer this patient for an OV in the next 24 hours for this/these issue(s).  I discussed the assessment and treatment plan with the patient. The patient was provided an opportunity to ask questions and all were answered. The patient agreed with the plan and demonstrated an understanding of the instructions.   The patient was advised to call back or seek an in-person evaluation if the symptoms worsen or if the  condition fails to improve as anticipated.  I provided 17 minutes of non-face-to-face time during this encounter.   [name], MD     Review of Systems     Objective:   Physical Exam        Assessment & Plan:

## 2021-01-19 ENCOUNTER — Telehealth: Payer: Self-pay

## 2021-01-19 DIAGNOSIS — Z933 Colostomy status: Secondary | ICD-10-CM | POA: Diagnosis not present

## 2021-01-19 NOTE — Telephone Encounter (Signed)
Prolia VOB initiated via [email or web address]  Last OV: 12/20/20 Next OV:  Last Prolia inj: 09/13/20 Next Prolia inj DUE: 03/17/21

## 2021-01-20 DIAGNOSIS — Z933 Colostomy status: Secondary | ICD-10-CM | POA: Diagnosis not present

## 2021-01-23 DIAGNOSIS — M19042 Primary osteoarthritis, left hand: Secondary | ICD-10-CM | POA: Diagnosis not present

## 2021-01-23 DIAGNOSIS — I5042 Chronic combined systolic (congestive) and diastolic (congestive) heart failure: Secondary | ICD-10-CM | POA: Diagnosis present

## 2021-01-23 DIAGNOSIS — M79642 Pain in left hand: Secondary | ICD-10-CM | POA: Diagnosis not present

## 2021-01-23 DIAGNOSIS — M79641 Pain in right hand: Secondary | ICD-10-CM | POA: Diagnosis not present

## 2021-01-23 DIAGNOSIS — M069 Rheumatoid arthritis, unspecified: Secondary | ICD-10-CM | POA: Diagnosis not present

## 2021-01-23 DIAGNOSIS — M19041 Primary osteoarthritis, right hand: Secondary | ICD-10-CM | POA: Diagnosis not present

## 2021-01-24 NOTE — Telephone Encounter (Signed)
Pt ready for scheduling on or after 03/17/21  Out-of-pocket cost due at time of visit: $0  Primary: [employer] Medicare Prolia co-insurance: 0% Admin fee co-insurance: 0%   Secondary: n/a Prolia co-insurance:  Admin fee co-insurance:   Deductible: does not apply  Prior Auth: not required PA# Valid:

## 2021-01-25 ENCOUNTER — Telehealth: Payer: Self-pay | Admitting: Endocrinology

## 2021-01-25 NOTE — Telephone Encounter (Signed)
Pt is asking if she needs to have the bone density test before her prolia shot. Pt would like a call back.

## 2021-01-25 NOTE — Telephone Encounter (Signed)
LMTCB to schedule Prolia injection

## 2021-01-31 ENCOUNTER — Other Ambulatory Visit: Payer: Self-pay

## 2021-01-31 ENCOUNTER — Ambulatory Visit
Admission: RE | Admit: 2021-01-31 | Discharge: 2021-01-31 | Disposition: A | Discharge status: Home / Self Care | Payer: Medicare Other | Source: Ambulatory Visit | Attending: Interventional Radiology | Admitting: Interventional Radiology

## 2021-01-31 ENCOUNTER — Encounter: Payer: Self-pay | Admitting: *Deleted

## 2021-01-31 DIAGNOSIS — N2889 Other specified disorders of kidney and ureter: Secondary | ICD-10-CM | POA: Diagnosis not present

## 2021-01-31 DIAGNOSIS — Z9889 Other specified postprocedural states: Secondary | ICD-10-CM | POA: Diagnosis not present

## 2021-01-31 HISTORY — PX: IR RADIOLOGIST EVAL & MGMT: IMG5224

## 2021-02-01 ENCOUNTER — Other Ambulatory Visit: Payer: Self-pay | Admitting: Family Medicine

## 2021-02-03 ENCOUNTER — Telehealth: Payer: Self-pay | Admitting: Interventional Cardiology

## 2021-02-03 NOTE — Telephone Encounter (Signed)
Spoke with pt in regards to STAT CP.  She reports that she has a colostomy bag and took Miralax yesterday 02/02/21.  Last night she started to have dull CP/ tightness on the left side of her chest.  She reports it lasted for 6 hours, she was able to sleep through the pain.  When she woke up this morning she noticed that her colostomy bag was full of air.  She feels that discomfort may have come from taking miralax.  She denies nausea, dizziness, arm/ jaw pain.  She reports that she has been CP free since this morning.  I reviewed ED precautions with pt she verbalizes understanding.  Will route to MD and nurse.

## 2021-02-03 NOTE — Telephone Encounter (Signed)
Pt c/o of Chest Pain: STAT if CP now or developed within 24 hours  1. Are you having CP right now?  No   2. Are you experiencing any other symptoms (ex. SOB, nausea, vomiting, sweating)?  No   3. How long have you been experiencing CP?  Chest pain started last night around 11:00 PM.   4. Is your CP continuous or coming and going? Coming and going  5. Have you taken Nitroglycerin?  No   Also, I scheduled the patient for 08/07/20 with Dr. Tamala Julian and added her to his wait-list. Looks like she will need a new order for an echo a few weeks prior to that appointment.  ?

## 2021-02-06 DIAGNOSIS — M79641 Pain in right hand: Secondary | ICD-10-CM | POA: Diagnosis not present

## 2021-02-06 DIAGNOSIS — M79642 Pain in left hand: Secondary | ICD-10-CM | POA: Diagnosis not present

## 2021-02-09 DIAGNOSIS — R5381 Other malaise: Secondary | ICD-10-CM | POA: Diagnosis not present

## 2021-02-13 DIAGNOSIS — M79641 Pain in right hand: Secondary | ICD-10-CM | POA: Diagnosis not present

## 2021-02-13 DIAGNOSIS — M25631 Stiffness of right wrist, not elsewhere classified: Secondary | ICD-10-CM | POA: Diagnosis not present

## 2021-02-13 DIAGNOSIS — M79642 Pain in left hand: Secondary | ICD-10-CM | POA: Diagnosis not present

## 2021-02-13 DIAGNOSIS — M25641 Stiffness of right hand, not elsewhere classified: Secondary | ICD-10-CM | POA: Diagnosis not present

## 2021-02-14 NOTE — Progress Notes (Signed)
Chief Complaint: Patient was consulted remotely today (TeleHealth) for follow up after cryoablation of a left renal carcinoma.  History of Present Illness: [name] is a 65 y.o. 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  She did quite well following the procedure and remains asymptomatic.   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   bilateral     Past Surgical History:  Procedure Laterality Date   ABLATION  01/04/14   atrial flutter ablation (2 circuits) by Dr Lovena Le   ATRIAL FLUTTER ABLATION N/A 01/04/2014   Procedure: ATRIAL FLUTTER ABLATION;  Surgeon: Evans Lance, MD;  Location: [facility] CATH LAB;  Service: Cardiovascular;  Laterality: N/A;   BUNIONECTOMY Bilateral 1/10   COLON SURGERY     colectomy   COLOSTOMY  11/27/2012   HERNIA REPAIR     IR RADIOLOGIST EVAL & MGMT  10/01/2017   IR RADIOLOGIST EVAL & MGMT  11/21/2017   IR RADIOLOGIST EVAL & MGMT  02/13/2018   IR RADIOLOGIST EVAL & MGMT  01/08/2019   IR RADIOLOGIST EVAL & MGMT  12/29/2019   IR RADIOLOGIST EVAL & MGMT  01/31/2021   LAPAROSCOPIC ABDOMINAL EXPLORATION N/A 01/06/2013   Procedure: LAPAROSCOPIC ABDOMINAL EXPLORATION;   Surgeon: Adin Hector, MD;  Location: [facility] ORS;  Service: General;  Laterality: N/A;   LAPAROSCOPIC LYSIS OF ADHESIONS N/A 01/06/2013   Procedure: LAPAROSCOPIC LYSIS OF ADHESIONS/ INTEROTOMY REPAIR;  Surgeon: Adin Hector, MD;  Location: [facility] ORS;  Service: General;  Laterality: N/A;   LAPAROTOMY N/A 11/27/2012   Procedure: EXPLORATORY LAPAROTOMY SIGMOID COLECTOMY, COLOSTOMY;  Surgeon: Madilyn Hook, DO;  Location: [facility] ORS;  Service: General;  Laterality: N/A;   RADIOLOGY WITH ANESTHESIA Left 10/23/2017   Procedure: CT RENAL CRYO AND BIOPSY;  Surgeon: Aletta Edouard, MD;  Location: [facility] ORS;  Service: Radiology;  Laterality: Left;   TOTAL KNEE ARTHROPLASTY Bilateral 6/08    Allergies: Spinach  Medications: Prior to Admission medications   Medication Sig Start Date End Date Taking? Authorizing Provider  Abatacept 125 MG/ML SOSY Inject 125 mg into the skin every Monday. tuesday    [provider]  albuterol (VENTOLIN HFA) 108 (90 Base) MCG/ACT inhaler INHALE 1 PUFF INTO THE LUNGS EVERY 4 HOURS AS NEEDED FOR WHEEZING OR SHORTNESS OF BREATH 11/11/20   [name], MD  allopurinol (ZYLOPRIM) 100 MG tablet TAKE 1 TABLET BY MOUTH EVERY DAY 02/01/21   [name], MD  amLODipine (NORVASC) 10 MG tablet TAKE 1 TABLET BY  MOUTH EVERY DAY 09/07/20   [name], MD  aspirin EC 81 MG tablet Take 1 tablet (81 mg total) by mouth daily. 05/11/14   Verlee Monte, MD  CALCIUM PO Take 1 tablet by mouth 2 (two) times daily.    [provider]  cetirizine (ZYRTEC) 10 MG tablet Take 1 tablet (10 mg total) by mouth daily. 11/17/14   [name], MD  Cholecalciferol (VITAMIN D) 2000 units CAPS Take 2,000 Units by mouth daily.    [provider]  denosumab (PROLIA) 60 MG/ML SOSY injection Inject 60 mg into the skin every 6 (six) months.    [provider]  diazepam (VALIUM) 2 MG tablet Take 1 tablet (2 mg total) by mouth every 12 (twelve) hours as needed for anxiety. 07/22/20    [name], MD  diclofenac sodium (VOLTAREN) 1 % GEL Apply 1 application topically 4 (four) times daily as needed (arthritis).    [provider]  docusate sodium (COLACE) 100 MG capsule Take 100 mg by mouth 2 (two) times daily.    [provider]  esomeprazole (NEXIUM) 40 MG capsule Take 1 capsule (40 mg total) by mouth daily in the afternoon. 06/20/20   [name], MD  folic acid (FOLVITE) A999333 MCG tablet Take 400 mcg by mouth daily.    [provider]  HYDROcodone-acetaminophen (NORCO) 10-325 MG tablet Take 1 tablet by mouth every 6 (six) hours as needed for moderate pain. 03/21/21 04/20/21  [name], MD  ketoconazole (NIZORAL) 2 % shampoo Apply 1 application topically 2 (two) times a week. 03/04/20   [provider]  megestrol (MEGACE ES) 625 MG/5ML suspension take 5 milliliters by mouth three times daily before meals as needed for appetite Patient taking differently: Take 625 mg by mouth daily as needed (appetite). 05/17/20   [name], MD  Methotrexate Sodium (METHOTREXATE, PF,) 50 MG/2ML injection Inject 0.5 mLs into the muscle once a week. Tues 05/14/16   [provider]  Multiple Vitamin (MULTIVITAMIN WITH MINERALS) TABS tablet Take 1 tablet by mouth daily.    [provider]  PERIDEX 0.12 % solution Use as directed 15 mLs in the mouth or throat 2 (two) times daily.  03/23/20   [provider]  polyethylene glycol (MIRALAX / GLYCOLAX) 17 g packet Take 17 g by mouth daily as needed for mild constipation.    [provider]  tiZANidine (ZANAFLEX) 4 MG tablet TAKE 1 TABLET(4 MG) BY MOUTH TWICE DAILY Patient taking differently: Take 4 mg by mouth in the morning and at bedtime. 06/08/20   [name], MD  traMADol (ULTRAM) 50 MG tablet Take 1-2 tablets (50-100 mg total) by mouth every 6 (six) hours as needed. 01/18/21   [name], MD  triamcinolone lotion (KENALOG) 0.1 % Apply 1 application topically 2  (two) times daily as needed (itching). 01/18/21   [name], MD  vitamin B-12 (CYANOCOBALAMIN) 500 MCG tablet Take 500 mcg by mouth daily.    [provider]  vitamin C (ASCORBIC ACID) 500 MG tablet Take 500 mg by mouth daily.     [provider]     Family History  Problem Relation Age of Onset   Heart attack Neg Hx    Colon cancer Neg Hx    Osteoporosis Neg Hx     Social History   Socioeconomic History   Marital status: Single    Spouse name: Not on file  Number of children: 1   Years of education: Not on file   Highest education level: Not on file  Occupational History   Not on file  Tobacco Use   Smoking status: Former    Packs/day: 1.00    Types: Cigarettes    Quit date: 11/27/2012    Years since quitting: 8.2   Smokeless tobacco: Never  Vaping Use   Vaping Use: Former  Substance and Sexual Activity   Alcohol use: No    Alcohol/week: 0.0 standard drinks    Comment: recovering alcoholic sober since 0000000   Drug use: No   Sexual activity: Not Currently    Partners: Male    Birth control/protection: None  Other Topics Concern   Not on file  Social History Narrative   Not on file   Social Determinants of Health   Financial Resource Strain: Low Risk    Difficulty of Paying Living Expenses: Not hard at all  Food Insecurity: No Food Insecurity   Worried About Charity fundraiser in the Last Year: Never true   [place] in the Last Year: Never true  Transportation Needs: No Transportation Needs   Lack of Transportation (Medical): No   Lack of Transportation (Non-Medical): No  Physical Activity: Inactive   Days of Exercise per Week: 0 days   Minutes of Exercise per Session: 0 min  Stress: No Stress Concern Present   Feeling of Stress : Not at all  Social Connections: Unknown   Frequency of Communication with Friends and Family: Not on file   Frequency of Social Gatherings with Friends and Family: Once a week   Attends Religious  Services: Never   Marine scientist or Organizations: No   Attends Archivist Meetings: Never   Marital Status: Never married    ECOG Status: 0 - Asymptomatic  Review of Systems  Constitutional: Negative.   Respiratory: Negative.    Cardiovascular: Negative.   Gastrointestinal: Negative.   Genitourinary: Negative.   Musculoskeletal: Negative.   Neurological: Negative.    Review of Systems: A 12 point ROS discussed and pertinent positives are indicated in the HPI above.  All other systems are negative.  Physical Exam No direct physical exam was performed (except for noted visual exam findings with Video Visits).   Vital Signs: LMP 06/25/2006 (LMP Unknown)   Imaging: CT ABDOMEN W WO CONTRAST  Result Date: 01/17/2021 CLINICAL DATA:  Status post cryoablation for papillary renal cell carcinoma. EXAM: CT ABDOMEN WITHOUT AND WITH CONTRAST TECHNIQUE: Multidetector CT imaging of the abdomen was performed following the standard protocol before and following the bolus administration of intravenous contrast. CONTRAST:  169m OMNIPAQUE IOHEXOL 350 MG/ML SOLN COMPARISON:  07/04/2020 FINDINGS: Lower chest: 4 mm subpleural right middle lobe pulmonary nodule is stable in the interval. Hepatobiliary: Stable 4.4 cm cyst in the lateral segment left liver. Tiny cyst noted lateral segment left liver. There is no evidence for gallstones, gallbladder wall thickening, or pericholecystic fluid. No intrahepatic or extrahepatic biliary dilation. Pancreas: Diffuse distention of the main pancreatic duct is stable in the interval with ductal dilatation extending to the level of the ampulla. Spleen: No splenomegaly. No focal mass lesion. Adrenals/Urinary Tract: No adrenal nodule or mass. Cortical scarring noted in both kidneys. No suspicious abnormality in the right kidney with stable appearance of the 9 mm nonspecific hypodensity anterior interpolar region. Ablation defect noted posterior upper  interpolar left kidney, stable without features to suggest local recurrence. Stomach/Bowel: Stomach is unremarkable.  No gastric wall thickening. No evidence of outlet obstruction. Duodenum is normally positioned as is the ligament of [place]itz. No small bowel or colonic dilatation in the abdomen. Diverticular changes are seen in the abdominal segments of the colon with left abdominal end colostomy evident. Vascular/Lymphatic: Insert calcium aorta sedan. There is advanced atherosclerotic calcification of the abdominal aorta without aneurysm. There is no gastrohepatic or hepatoduodenal ligament lymphadenopathy. No retroperitoneal or mesenteric lymphadenopathy. Other: No intraperitoneal free fluid. Musculoskeletal: Advanced thoracolumbar scoliosis. No worrisome lytic or sclerotic osseous abnormality. Incomplete visualization of a large ventral hernia. IMPRESSION: 1. Stable exam. No new or progressive interval findings. 2. Stable appearance of post ablation defect posterior upper interpolar left kidney. No evidence for local recurrence. No findings to suggest metastatic disease. 3. Stable distention of the main pancreatic duct with ductal dilatation extending to the level of the ampulla. 4. Stable 4 mm subpleural right middle lobe pulmonary nodule. Continued attention on follow-up recommended. 5. Aortic Atherosclerosis (ICD10-I70.0). Electronically Signed   By: Misty Stanley M.D.   On: 01/17/2021 09:45   IR Radiologist Eval & Mgmt  Result Date: 01/31/2021 Please refer to notes tab for details about interventional procedure. (Op Note)   Labs:  CBC: Recent Labs    05/12/20 0523 07/04/20 1731 07/05/20 0400 07/06/20 0318  WBC 8.5 7.1 5.1 4.2  HGB 11.1* 12.5 11.6* 12.3  HCT 35.2* 38.2 36.4 38.5  PLT 223 264 221 206    COAGS: No results for input(s): INR, APTT in the last 8760 hours.  BMP: Recent Labs    05/12/20 0523 07/04/20 1731 07/05/20 0400 07/06/20 0318 12/20/20 1032 01/16/21 1145  NA 138  139 141 146*  --   --   K 3.8 3.7 3.7 3.9  --   --   CL 107 100 103 107  --   --   CO2 '22 28 29 26  '$ --   --   GLUCOSE 90 116* 96 89  --   --   BUN <5* '12 14 15  '$ --   --   CALCIUM 8.1* 9.6 8.4* 8.2* 9.8  --   CREATININE 0.51 1.32* 1.13* 0.70  --  0.70  GFRNONAA >60 45* 54* >60  --   --     LIVER FUNCTION TESTS: Recent Labs    05/12/20 0523 07/04/20 1731 07/05/20 0400 07/06/20 0318  BILITOT 0.7 0.8 0.6 0.6  AST 19 32 28 35  ALT '17 26 23 25  '$ ALKPHOS 53 79 68 65  PROT 6.1* 7.3 6.5 6.5  ALBUMIN 3.4* 4.0 3.5 3.4*    Assessment and Plan:  Follow-up CT of the abdomen was performed on 01/16/2021 and demonstrates further retraction of an ablation defect at the level of the treated upper pole left renal carcinoma since prior CT in 2020 and MRI on 04/27/2019.  The ablation defect is barely detectable.  There is no evidence of carcinoma recurrence.  Given appearance of the ablation defect, I recommended waiting for follow-up imaging until May, 2024 at which time she will be 5 years post ablation.  At that time, routine imaging follow-up can be discontinued should there continue to be no evidence of recurrence.  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    Visit type: Audio only (telephone). Audio (no video) only due to patient's  lack of internet/smartphone capability. Alternative for in-person consultation at [facility], [address], [place], [place]. This visit type was conducted due to national recommendations for restrictions regarding the COVID-19 Pandemic (e.g. social distancing).  This format is felt to be most appropriate for this patient at this time.  All issues noted in this document were discussed and addressed.

## 2021-02-16 ENCOUNTER — Telehealth: Payer: Self-pay | Admitting: Pharmacist

## 2021-02-16 NOTE — Chronic Care Management (AMB) (Signed)
Chronic Care Management Pharmacy Assistant   Name: [name]  MRN: [id number] DOB: 01/05/1956  Reason for Encounter: Disease State/ Hypertension Assessment Call.    Conditions to be addressed/monitored: HTN   Recent office visits:  01/18/21 [name] MD (PCP) - video visit for chronic narcotic use. Refilled hydrocodone -acetaminophen 10-'325mg'$ . No follow up noted.   Recent consult visits:  01/23/21 Leanora Cover (Orthopaedics) - seen for initial consult for bilateral hand pain and rheumatoid arthritis. No medication changes or follow up noted.   Hospital visits:  None in previous 6 months  Medications: Outpatient Encounter Medications as of 02/16/2021  Medication Sig   Abatacept 125 MG/ML SOSY Inject 125 mg into the skin every Monday. tuesday   albuterol (VENTOLIN HFA) 108 (90 Base) MCG/ACT inhaler INHALE 1 PUFF INTO THE LUNGS EVERY 4 HOURS AS NEEDED FOR WHEEZING OR SHORTNESS OF BREATH   allopurinol (ZYLOPRIM) 100 MG tablet TAKE 1 TABLET BY MOUTH EVERY DAY   amLODipine (NORVASC) 10 MG tablet TAKE 1 TABLET BY MOUTH EVERY DAY   aspirin EC 81 MG tablet Take 1 tablet (81 mg total) by mouth daily.   CALCIUM PO Take 1 tablet by mouth 2 (two) times daily.   cetirizine (ZYRTEC) 10 MG tablet Take 1 tablet (10 mg total) by mouth daily.   Cholecalciferol (VITAMIN D) 2000 units CAPS Take 2,000 Units by mouth daily.   denosumab (PROLIA) 60 MG/ML SOSY injection Inject 60 mg into the skin every 6 (six) months.   diazepam (VALIUM) 2 MG tablet Take 1 tablet (2 mg total) by mouth every 12 (twelve) hours as needed for anxiety.   diclofenac sodium (VOLTAREN) 1 % GEL Apply 1 application topically 4 (four) times daily as needed (arthritis).   docusate sodium (COLACE) 100 MG capsule Take 100 mg by mouth 2 (two) times daily.   esomeprazole (NEXIUM) 40 MG capsule Take 1 capsule (40 mg total) by mouth daily in the afternoon.   folic acid (FOLVITE) A999333 MCG tablet Take 400 mcg by mouth daily.   [START  ON 03/21/2021] HYDROcodone-acetaminophen (NORCO) 10-325 MG tablet Take 1 tablet by mouth every 6 (six) hours as needed for moderate pain.   ketoconazole (NIZORAL) 2 % shampoo Apply 1 application topically 2 (two) times a week.   megestrol (MEGACE ES) 625 MG/5ML suspension take 5 milliliters by mouth three times daily before meals as needed for appetite (Patient taking differently: Take 625 mg by mouth daily as needed (appetite).)   Methotrexate Sodium (METHOTREXATE, PF,) 50 MG/2ML injection Inject 0.5 mLs into the muscle once a week. Tues   Multiple Vitamin (MULTIVITAMIN WITH MINERALS) TABS tablet Take 1 tablet by mouth daily.   PERIDEX 0.12 % solution Use as directed 15 mLs in the mouth or throat 2 (two) times daily.    polyethylene glycol (MIRALAX / GLYCOLAX) 17 g packet Take 17 g by mouth daily as needed for mild constipation.   tiZANidine (ZANAFLEX) 4 MG tablet TAKE 1 TABLET(4 MG) BY MOUTH TWICE DAILY (Patient taking differently: Take 4 mg by mouth in the morning and at bedtime.)   traMADol (ULTRAM) 50 MG tablet Take 1-2 tablets (50-100 mg total) by mouth every 6 (six) hours as needed.   triamcinolone lotion (KENALOG) 0.1 % Apply 1 application topically 2 (two) times daily as needed (itching).   vitamin B-12 (CYANOCOBALAMIN) 500 MCG tablet Take 500 mcg by mouth daily.   vitamin C (ASCORBIC ACID) 500 MG tablet Take 500 mg by mouth daily.  No facility-administered encounter medications on file as of 02/16/2021.   Fill History: ORENCIA  125 MG/ML SOSY 02/06/2021 28   ALBUTEROL HFA INH (200 PUFFS)8.5GM 02/06/2021 33   ALLOPURINOL '100MG'$  TABLETS 02/01/2021 30   CHLORHEXIDINE 0.12% ORAL RINSE 02/06/2021 28   ESOMEPRAZOLE MAGNESIUM '40MG'$  DR CAPS 12/08/2020 90   HYDROCODONE/ACETAMINOPHEN 10-325 T 02/06/2021 30   METHOTREXATE '25MG'$ /ML PF SINGLE USE 12/21/2020 28   TRIAMCINOLONE 0.1% LOTION 60ML 01/18/2021 15   B-D #5945 SYR/NDL 1ML 27GX1/2 SG TB 01/19/2021 84   AMLODIPINE BESYLATE '10MG'$   TABLETS 12/04/2020 90   TIZANIDINE '4MG'$  TABLETS 12/08/2020 45   TRAMADOL '50MG'$  TABLETS 01/18/2021 23   Reviewed chart prior to disease state call. Spoke with patient regarding BP  Recent Office Vitals: BP Readings from Last 3 Encounters:  01/18/21 119/78  12/20/20 126/78  07/09/20 (!) 141/83   Pulse Readings from Last 3 Encounters:  12/20/20 76  07/09/20 70  06/20/20 86    Wt Readings from Last 3 Encounters:  12/20/20 150 lb (68 kg)  10/21/20 150 lb (68 kg)  07/05/20 151 lb 0.2 oz (68.5 kg)     Kidney Function Lab Results  Component Value Date/Time   CREATININE 0.70 01/16/2021 11:45 AM   CREATININE 0.70 07/06/2020 03:18 AM   CREATININE 0.76 11/19/2017 10:24 AM   GFR 107.53 04/20/2019 12:02 PM   GFRNONAA >60 07/06/2020 03:18 AM   GFRNONAA 85 11/19/2017 10:24 AM   GFRAA 98 11/19/2017 10:24 AM    BMP Latest Ref Rng & Units 01/16/2021 12/20/2020 07/06/2020  Glucose 70 - 99 mg/dL - - 89  BUN 8 - 23 mg/dL - - 15  Creatinine 0.44 - 1.00 mg/dL 0.70 - 0.70  Sodium 135 - 145 mmol/L - - 146(H)  Potassium 3.5 - 5.1 mmol/L - - 3.9  Chloride 98 - 111 mmol/L - - 107  CO2 22 - 32 mmol/L - - 26  Calcium 8.6 - 10.4 mg/dL - 9.8 8.2(L)    Current antihypertensive regimen:  Amlodipine '10mg'$  - take 1 tablet by mouth everyday. How often are you checking your Blood Pressure? when feeling symptomatic Current home BP readings: 101-120s over 79-86s range for her blood pressure typically.  What recent interventions/DTPs have been made by any provider to improve Blood Pressure control since last CPP Visit: None. Any recent hospitalizations or ED visits since last visit with CPP? No  Adherence Review: Is the patient currently on ACE/ARB medication? No Does the patient have >5 day gap between last estimated fill dates? No  Notes: Spoke with patient and reviewed all medications as listed above. Patient reports taking all medications as prescribed except now her injections besides her prolia are  on wednesdays only. Patient states for breakfast she only has coffee. For lunch patient has a sandwich, some chips and a cookie usually. For dinner patient has four courses and its usually chicken or fish with two vegetables and a starch or two starches and a vegetable. Patient does not add salt to food and she has around 32 ounces of water everyday. Patient is checking her blood pressure here and there it usually always runs between [id number] for her systolic reading and [id number] for her diastolic reading.patient does not eat red meat but once ina while. I scheduled patient for her follow up appointment with [name].   Care Gaps:  AWV - scheduled for 10/11/21 Foot exam - never done Ophthalmology exam - never done Urine microalbumin - never done Hepatitis C screening -  never done  Zoster vaccines - never done Hemoglobin A1C - overdue since 12/08/15 Tetanus/TDAP -  overdue since 09/03/20 Flu vaccine - due  Star Rating Drugs:  None.  [place]  Clinical Pharmacist Assistant [phone]

## 2021-02-17 DIAGNOSIS — Z933 Colostomy status: Secondary | ICD-10-CM | POA: Diagnosis not present

## 2021-03-03 ENCOUNTER — Other Ambulatory Visit: Payer: Self-pay | Admitting: Family Medicine

## 2021-03-06 DIAGNOSIS — Z79899 Other long term (current) drug therapy: Secondary | ICD-10-CM | POA: Diagnosis not present

## 2021-03-06 DIAGNOSIS — M1A09X0 Idiopathic chronic gout, multiple sites, without tophus (tophi): Secondary | ICD-10-CM | POA: Diagnosis not present

## 2021-03-06 DIAGNOSIS — R5383 Other fatigue: Secondary | ICD-10-CM | POA: Diagnosis not present

## 2021-03-06 DIAGNOSIS — M0579 Rheumatoid arthritis with rheumatoid factor of multiple sites without organ or systems involvement: Secondary | ICD-10-CM | POA: Diagnosis not present

## 2021-03-06 DIAGNOSIS — M2550 Pain in unspecified joint: Secondary | ICD-10-CM | POA: Diagnosis not present

## 2021-03-14 DIAGNOSIS — R5381 Other malaise: Secondary | ICD-10-CM | POA: Diagnosis not present

## 2021-03-18 DIAGNOSIS — Z933 Colostomy status: Secondary | ICD-10-CM | POA: Diagnosis not present

## 2021-03-27 ENCOUNTER — Ambulatory Visit (HOSPITAL_COMMUNITY): Payer: Medicare Other | Attending: Cardiovascular Disease

## 2021-03-27 ENCOUNTER — Other Ambulatory Visit: Payer: Self-pay

## 2021-03-27 DIAGNOSIS — Q8740 Marfan's syndrome, unspecified: Secondary | ICD-10-CM | POA: Diagnosis not present

## 2021-03-27 DIAGNOSIS — I5042 Chronic combined systolic (congestive) and diastolic (congestive) heart failure: Secondary | ICD-10-CM | POA: Diagnosis not present

## 2021-03-27 LAB — ECHOCARDIOGRAM COMPLETE
Area-P 1/2: 3.42 cm2
MV M vel: 6.19 m/s
MV Peak grad: 153.3 mmHg
Radius: 0.4 cm
S' Lateral: 2.9 cm

## 2021-03-29 ENCOUNTER — Telehealth: Payer: Self-pay | Admitting: Pharmacist

## 2021-03-29 NOTE — Chronic Care Management (AMB) (Signed)
Chronic Care Management Pharmacy Assistant   Name: [name]  MRN: [id number] DOB: 1956-05-11  Reason for Encounter: Disease State/ Hypertension Assessment Call.    Conditions to be addressed/monitored: HTN   Recent office visits:  None.  Recent consult visits:  None.  Hospital visits:  None in previous 6 months  Medications: Outpatient Encounter Medications as of 03/29/2021  Medication Sig   Abatacept 125 MG/ML SOSY Inject 125 mg into the skin every Monday. tuesday   albuterol (VENTOLIN HFA) 108 (90 Base) MCG/ACT inhaler INHALE 1 PUFF INTO THE LUNGS EVERY 4 HOURS AS NEEDED FOR WHEEZING OR SHORTNESS OF BREATH   allopurinol (ZYLOPRIM) 100 MG tablet TAKE 1 TABLET BY MOUTH EVERY DAY   amLODipine (NORVASC) 10 MG tablet TAKE 1 TABLET BY MOUTH EVERY DAY   aspirin EC 81 MG tablet Take 1 tablet (81 mg total) by mouth daily.   CALCIUM PO Take 1 tablet by mouth 2 (two) times daily.   cetirizine (ZYRTEC) 10 MG tablet Take 1 tablet (10 mg total) by mouth daily.   Cholecalciferol (VITAMIN D) 2000 units CAPS Take 2,000 Units by mouth daily.   denosumab (PROLIA) 60 MG/ML SOSY injection Inject 60 mg into the skin every 6 (six) months.   diazepam (VALIUM) 2 MG tablet Take 1 tablet (2 mg total) by mouth every 12 (twelve) hours as needed for anxiety.   diclofenac sodium (VOLTAREN) 1 % GEL Apply 1 application topically 4 (four) times daily as needed (arthritis).   docusate sodium (COLACE) 100 MG capsule Take 100 mg by mouth 2 (two) times daily.   esomeprazole (NEXIUM) 40 MG capsule Take 1 capsule (40 mg total) by mouth daily in the afternoon.   folic acid (FOLVITE) 578 MCG tablet Take 400 mcg by mouth daily.   HYDROcodone-acetaminophen (NORCO) 10-325 MG tablet Take 1 tablet by mouth every 6 (six) hours as needed for moderate pain.   ketoconazole (NIZORAL) 2 % shampoo Apply 1 application topically 2 (two) times a week.   megestrol (MEGACE ES) 625 MG/5ML suspension take 5 milliliters by  mouth three times daily before meals as needed for appetite (Patient taking differently: Take 625 mg by mouth daily as needed (appetite).)   Methotrexate Sodium (METHOTREXATE, PF,) 50 MG/2ML injection Inject 0.5 mLs into the muscle once a week. Tues   Multiple Vitamin (MULTIVITAMIN WITH MINERALS) TABS tablet Take 1 tablet by mouth daily.   PERIDEX 0.12 % solution Use as directed 15 mLs in the mouth or throat 2 (two) times daily.    polyethylene glycol (MIRALAX / GLYCOLAX) 17 g packet Take 17 g by mouth daily as needed for mild constipation.   tiZANidine (ZANAFLEX) 4 MG tablet TAKE 1 TABLET(4 MG) BY MOUTH TWICE DAILY (Patient taking differently: Take 4 mg by mouth in the morning and at bedtime.)   traMADol (ULTRAM) 50 MG tablet Take 1-2 tablets (50-100 mg total) by mouth every 6 (six) hours as needed.   triamcinolone lotion (KENALOG) 0.1 % Apply 1 application topically 2 (two) times daily as needed (itching).   vitamin B-12 (CYANOCOBALAMIN) 500 MCG tablet Take 500 mcg by mouth daily.   vitamin C (ASCORBIC ACID) 500 MG tablet Take 500 mg by mouth daily.    No facility-administered encounter medications on file as of 03/29/2021.   Fill History: ORENCIA  125 MG/ML SOSY 03/23/2021 28   ALBUTEROL HFA INH (200 PUFFS)8.5GM 02/06/2021 33   ALLOPURINOL 100MG  TABLETS 03/03/2021 90   CHLORHEXIDINE 0.12% ORAL RINSE 02/06/2021 28  ESOMEPRAZOLE MAGNESIUM 40MG  DR CAPS 03/23/2021 90   HYDROCODONE/ACETAMINOPHEN 10-325 T 03/08/2021 30   METHOTREXATE 25MG /ML PF SINGLE USE 12/21/2020 28   TRIAMCINOLONE 0.1% LOTION 60ML 01/18/2021 15   B-D #5945 SYR/NDL 1ML 27GX1/2 SG TB 01/19/2021 84   AMLODIPINE BESYLATE 10MG  TABLETS 03/03/2021 90   TIZANIDINE 4MG  TABLETS 03/08/2021 45   TRAMADOL 50MG  TABLETS 01/18/2021 23   Reviewed chart prior to disease state call. Spoke with patient regarding BP  Recent Office Vitals: BP Readings from Last 3 Encounters:  01/18/21 119/78  12/20/20 126/78  07/09/20 (!)  141/83   Pulse Readings from Last 3 Encounters:  12/20/20 76  07/09/20 70  06/20/20 86    Wt Readings from Last 3 Encounters:  12/20/20 150 lb (68 kg)  10/21/20 150 lb (68 kg)  07/05/20 151 lb 0.2 oz (68.5 kg)     Kidney Function Lab Results  Component Value Date/Time   CREATININE 0.70 01/16/2021 11:45 AM   CREATININE 0.70 07/06/2020 03:18 AM   CREATININE 0.76 11/19/2017 10:24 AM   GFR 107.53 04/20/2019 12:02 PM   GFRNONAA >60 07/06/2020 03:18 AM   GFRNONAA 85 11/19/2017 10:24 AM   GFRAA 98 11/19/2017 10:24 AM    BMP Latest Ref Rng & Units 01/16/2021 12/20/2020 07/06/2020  Glucose 70 - 99 mg/dL - - 89  BUN 8 - 23 mg/dL - - 15  Creatinine 0.44 - 1.00 mg/dL 0.70 - 0.70  Sodium 135 - 145 mmol/L - - 146(H)  Potassium 3.5 - 5.1 mmol/L - - 3.9  Chloride 98 - 111 mmol/L - - 107  CO2 22 - 32 mmol/L - - 26  Calcium 8.6 - 10.4 mg/dL - 9.8 8.2(L)    Current antihypertensive regimen:  Amlodipine 10mg  - take 1 tablet by mouth everyday. How often are you checking your Blood Pressure? infrequently Current home BP readings: 110/78 on 04/02/21. What recent interventions/DTPs have been made by any provider to improve Blood Pressure control since last CPP Visit: None.  Any recent hospitalizations or ED visits since last visit with CPP? No  Adherence Review: Is the patient currently on ACE/ARB medication? No Does the patient have >5 day gap between last estimated fill dates? No  Notes: Spoke with patient and reviewed all medications as prescribed. Patient reports as taking medications and no issues recently. Patient states she does eat a lot of fast food. She has chips and soda for lunch everyday. Does not really eat breakfast. She usually has a meat and a starch of some sort for dinner. Patient stated she walks with a walker so she's not able to do a lot of activity. Patient thanked me for my call.   Care Gaps:  AWV - scheduled for 10/11/21 Foot exam - never done Ophthalmology exam -  never done Urine microalbumin - never done Hepatitis C screening -  never done Zoster vaccines - never done Hemoglobin A1C - overdue since 12/08/15 Tetanus/TDAP -  overdue since 09/03/20 Flu vaccine - due  Star Rating Drugs:  None.  [place]  Clinical Pharmacist Assistant [phone]

## 2021-04-03 NOTE — Telephone Encounter (Cosign Needed)
2nd attempt

## 2021-04-04 NOTE — Telephone Encounter (Cosign Needed)
3rd attempt

## 2021-04-11 DIAGNOSIS — R5381 Other malaise: Secondary | ICD-10-CM | POA: Diagnosis not present

## 2021-04-15 DIAGNOSIS — Z933 Colostomy status: Secondary | ICD-10-CM | POA: Diagnosis not present

## 2021-04-18 NOTE — Telephone Encounter (Signed)
Dr. Loanne Drilling, Just wanted to let you know that pt is >30 days past due for Prolia injection.

## 2021-04-20 NOTE — Telephone Encounter (Signed)
Lvm to schedule prolia inj

## 2021-04-21 ENCOUNTER — Telehealth: Payer: Medicare Other | Admitting: Family Medicine

## 2021-04-24 ENCOUNTER — Telehealth: Payer: Medicare Other | Admitting: Family Medicine

## 2021-04-24 DIAGNOSIS — R3915 Urgency of urination: Secondary | ICD-10-CM | POA: Diagnosis not present

## 2021-05-01 ENCOUNTER — Telehealth: Payer: Self-pay | Admitting: Pharmacist

## 2021-05-01 ENCOUNTER — Telehealth: Payer: Medicare Other | Admitting: Family Medicine

## 2021-05-01 NOTE — Chronic Care Management (AMB) (Signed)
    Chronic Care Management Pharmacy Assistant   Name: [name]  MRN: [id number] DOB: 08/07/1955  05/02/21 [place], No answer, left message of appointment on 05/02/21 at 3:15pm via telephone visit with [name], Pharm D. Notified to have all medications, supplements, blood pressure and/or blood sugar logs available during appointment and to return call if need to reschedule.  Care Gaps:  AWV - scheduled for 10/11/21 Foot exam - never done Ophthalmology exam - never done Urine microalbumin - never done  Hepatitis C screening - never done Zoster vaccines - never done Hemoglobin A1C - overdue since 2017 Tetanus/ TDAP - overdue since 09/03/20 Flu vaccine - due  Pneumonia vaccine - due soon Last PCP BP: 142/80 P: 86 ON 06/20/20  Star Rating Drug:  None  Any gaps in medications fill history? N/A  [place] Pharmacist Assistant [phone]

## 2021-05-02 ENCOUNTER — Ambulatory Visit (INDEPENDENT_AMBULATORY_CARE_PROVIDER_SITE_OTHER): Payer: Medicare Other | Admitting: Pharmacist

## 2021-05-02 DIAGNOSIS — M069 Rheumatoid arthritis, unspecified: Secondary | ICD-10-CM

## 2021-05-02 DIAGNOSIS — I10 Essential (primary) hypertension: Secondary | ICD-10-CM

## 2021-05-02 NOTE — Progress Notes (Signed)
Chronic Care Management Pharmacy Note  05/02/21 Name:  DAKOTA STANGL MRN:  [id number] DOB:  1955-10-02  Summary: LDL above goal < 70 BP at goal < 130/80 per home readings  Recommendations/Changes made from today's visit: -Recommend initiating rosuvastatin 20 mg daily for LDL lowering -Recommended bringing BP cuff to next office visit to ensure accuracy  Plan: Follow up HLD assessment in 2-3 months  Subjective: [name] is an 65 y.o. year old female who is a primary patient of [name], MD.  The CCM team was consulted for assistance with disease management and care coordination needs.    Engaged with patient by telephone for follow up visit in response to provider referral for pharmacy case management and/or care coordination services.   Consent to Services:  The patient was given information about Chronic Care Management services, agreed to services, and gave verbal consent prior to initiation of services.  Please see initial visit note for detailed documentation.   Patient Care Team: [name], MD as PCP - General Tamala Julian Lynnell Dike, MD as PCP - Cardiology (Cardiology) Gatha Mayer, MD as Consulting Physician (Gastroenterology) Gavin Pound, MD as Consulting Physician (Rheumatology) Viona Gilmore, [facility] as Pharmacist (Pharmacist)  Recent office visits: 01/18/21 [name] MD (PCP) - video visit for chronic narcotic use. Refilled hydrocodone -acetaminophen 10-336m. No follow up noted.   10/05/20 TCharlott Rakes LPN: Patient presented for AWV.  Recent consult visits: 01/23/21 KLeanora Cover(Orthopaedics) - seen for initial consult for bilateral hand pain and rheumatoid arthritis. No medication changes or follow up noted.    12/20/20 SRenato Shin MD (endo): Patient presented for osteoporosis follow up. Plan to delay Prolia until after dental work.  10/19/2019- Cardiology- LCecilie Kicks RN- patient presented for virtual visit for atrial flutter. Suspicion of  Marfan's syndrome. ECHO was stable and patient was euvolemic. No awareness of irregular HR and HTN was controlled. Patient to follow up in person in 6 months.   Hospital visits: 1/10-1/15/22 Patient admitted for COVID infection and partial small bowel obstruction.   Objective:  Lab Results  Component Value Date   CREATININE 0.70 01/16/2021   BUN 15 07/06/2020   GFR 107.53 04/20/2019   GFRNONAA >60 07/06/2020   GFRAA 98 11/19/2017   NA 146 (H) 07/06/2020   K 3.9 07/06/2020   CALCIUM 9.8 12/20/2020   CO2 26 07/06/2020   GLUCOSE 89 07/06/2020    Lab Results  Component Value Date/Time   HGBA1C 5.6 06/09/2015 08:30 PM   HGBA1C 5.6 09/26/2009 03:53 PM   GFR 107.53 04/20/2019 12:02 PM   GFR 103.39 12/20/2014 11:31 AM    Last diabetic Eye exam: No results found for: HMDIABEYEEXA  Last diabetic Foot exam: No results found for: HMDIABFOOTEX   Lab Results  Component Value Date   CHOL 238 (H) 06/20/2020   HDL 76.10 06/20/2020   LDLCALC 145 (H) 06/20/2020   TRIG 86.0 06/20/2020   CHOLHDL 3 06/20/2020    Hepatic Function Latest Ref Rng & Units 07/06/2020 07/05/2020 07/04/2020  Total Protein 6.5 - 8.1 g/dL 6.5 6.5 7.3  Albumin 3.5 - 5.0 g/dL 3.4(L) 3.5 4.0  AST 15 - 41 U/L 35 28 32  ALT 0 - 44 U/L _0 Alk Phosphatase 38 - 126 U/L 65 68 79  Total Bilirubin 0.3 - 1.2 mg/dL 0.6 0.6 0.8  Bilirubin, Direct 0.1 - 0.5 mg/dL - - -    Lab Results  Component Value Date/Time  TSH 1.93 06/20/2020 11:42 AM   TSH 1.31 12/21/2019 03:04 PM   FREET4 0.94 06/20/2020 11:42 AM    CBC Latest Ref Rng & Units 07/06/2020 07/05/2020 07/04/2020  WBC 4.0 - 10.5 K/uL 4.2 5.1 7.1  Hemoglobin 12.0 - 15.0 g/dL 12.3 11.6(L) 12.5  Hematocrit 36.0 - 46.0 % 38.5 36.4 38.2  Platelets 150 - 400 K/uL 206 221 264    Lab Results  Component Value Date/Time   VD25OH 74.85 12/20/2020 10:32 AM   VD25OH 66.24 12/21/2019 03:04 PM    Clinical ASCVD: Yes  The ASCVD Risk score (Arnett DK, et al., 2019)  failed to calculate for the following reasons:   The patient has a prior MI or stroke diagnosis    Depression screen [facility] 2/9 12/20/2020 10/21/2020 10/05/2020  Decreased Interest 0 0 0  Down, Depressed, Hopeless 0 1 0  PHQ - 2 Score 0 1 0  Altered sleeping - 0 -  Tired, decreased energy - 0 -  Change in appetite - 0 -  Feeling bad or failure about yourself  - 0 -  Trouble concentrating - 0 -  Moving slowly or fidgety/restless - 0 -  Suicidal thoughts - 0 -  PHQ-9 Score - 1 -  Difficult doing work/chores - Not difficult at all -  Some recent data might be hidden     Social History   Tobacco Use  Smoking Status Former   Packs/day: 1.00   Types: Cigarettes   Quit date: 11/27/2012   Years since quitting: 8.4  Smokeless Tobacco Never   BP Readings from Last 3 Encounters:  01/18/21 119/78  12/20/20 126/78  07/09/20 (!) 141/83   Pulse Readings from Last 3 Encounters:  12/20/20 76  07/09/20 70  06/20/20 86   Wt Readings from Last 3 Encounters:  12/20/20 150 lb (68 kg)  10/21/20 150 lb (68 kg)  07/05/20 151 lb 0.2 oz (68.5 kg)   BMI Readings from Last 3 Encounters:  12/20/20 22.81 kg/m  10/21/20 22.81 kg/m  07/05/20 22.96 kg/m    Assessment/Interventions: Review of patient past medical history, allergies, medications, health status, including review of consultants reports, laboratory and other test data, was performed as part of comprehensive evaluation and provision of chronic care management services.   SDOH:  (Social Determinants of Health) assessments and interventions performed: No  SDOH Screenings   Alcohol Screen: Not on file  Depression (PHQ2-9): Low Risk    PHQ-2 Score: 0  Financial Resource Strain: Low Risk    Difficulty of Paying Living Expenses: Not hard at all  Food Insecurity: No Food Insecurity   Worried About Charity fundraiser in the Last Year: Never true   Ran Out of Food in the Last Year: Never true  Housing: Low Risk    Last Housing Risk  Score: 0  Physical Activity: Inactive   Days of Exercise per Week: 0 days   Minutes of Exercise per Session: 0 min  Social Connections: Unknown   Frequency of Communication with Friends and Family: Not on file   Frequency of Social Gatherings with Friends and Family: Once a week   Attends Religious Services: Never   Marine scientist or Organizations: No   Attends Music therapist: Never   Marital Status: Never married  Stress: No Stress Concern Present   Feeling of Stress : Not at all  Tobacco Use: Medium Risk   Smoking Tobacco Use: Former   Smokeless Tobacco Use: Never   Passive  Exposure: Not on file  Transportation Needs: No Transportation Needs   Lack of Transportation (Medical): No   Lack of Transportation (Non-Medical): No    CCM Care Plan  Allergies  Allergen Reactions   Spinach Itching, Rash and Other (See Comments)    Welts.    Medications Reviewed Today     Reviewed by [name], MD (Physician) on 05/03/21 at 1342  Med List Status: <None>   Medication Order Taking? Sig Documenting Provider Last Dose Status Informant  Abatacept 125 MG/ML SOSY [id number] Yes Inject 125 mg into the skin every Monday. tuesday [provider] Taking Active Child           Med Note Juanetta Beets, AMANDA R   Tue Aug 07, 2016  8:11 PM)    albuterol (VENTOLIN HFA) 108 [phone] Base) MCG/ACT inhaler [id number] Yes INHALE 1 PUFF INTO THE LUNGS EVERY 4 HOURS AS NEEDED FOR WHEEZING OR SHORTNESS OF Damian Leavell, MD Taking Active   allopurinol (ZYLOPRIM) 100 MG tablet [id number] Yes TAKE 1 TABLET BY MOUTH EVERY DAY [name], MD Taking Active   amLODipine (NORVASC) 10 MG tablet [id number] Yes TAKE 1 TABLET BY MOUTH EVERY DAY [name], MD Taking Active   aspirin EC 81 MG tablet [id number] Yes Take 1 tablet (81 mg total) by mouth daily. Verlee Monte, MD Taking Active Child  CALCIUM PO [id number] Yes Take 1 tablet by mouth 2 (two) times daily. [provider] Taking Active Child  cetirizine (ZYRTEC) 10 MG tablet [id number] Yes Take 1 tablet (10 mg total) by mouth daily. [name], MD Taking Active Child  Cholecalciferol (VITAMIN D) 2000 units CAPS [id number] Yes Take 2,000 Units by mouth daily. [provider] Taking Active Child  denosumab (PROLIA) 60 MG/ML SOSY injection [id number] Yes Inject 60 mg into the skin every 6 (six) months. [provider] Taking Active Child  diazepam (VALIUM) 2 MG tablet [id number] Yes Take 1 tablet (2 mg total) by mouth every 12 (twelve) hours as needed for anxiety. [name], MD Taking Active   diclofenac sodium (VOLTAREN) 1 % GEL [id number] Yes Apply 1 application topically 4 (four) times daily as needed (arthritis). [provider] Taking Active Child  docusate sodium (COLACE) 100 MG capsule [id number] Yes Take 100 mg by mouth 2 (two) times daily. [provider] Taking Active Child  esomeprazole (NEXIUM) 40 MG capsule [id number] Yes Take 1 capsule (40 mg total) by mouth daily in the afternoon. [name], MD Taking Active Child  folic acid (FOLVITE) 164 MCG tablet [id number] Yes Take 400 mcg by mouth daily. [provider] Taking Active Child  ketoconazole (NIZORAL) 2 % shampoo [id number] Yes Apply 1 application topically 2 (two) times a week. [provider] Taking Active Child  megestrol (MEGACE ES) 625 MG/5ML suspension [id number] Yes take 5 milliliters by mouth three times daily before meals as needed for appetite  Patient taking differently: Take 625 mg by mouth daily as needed (appetite).   [name], MD Taking Active   Methotrexate Sodium (METHOTREXATE, PF,) 50 MG/2ML injection [id number] Yes Inject 0.5 mLs into the muscle once a week. Tues [provider] Taking Active Child           Med Note Vonzella Nipple, [place] S   Fri Jun 29, 2016  5:35 PM)    Multiple Vitamin (MULTIVITAMIN WITH MINERALS) TABS tablet [id number] Yes Take 1 tablet  by mouth daily. [provider] Taking Active Child  PERIDEX 0.12 % solution [id number] Yes Use as directed 15 mLs in the mouth or throat 2 (two) times daily.  [provider] Taking Active Child  polyethylene glycol (MIRALAX / GLYCOLAX) 17 g packet [id number] Yes Take 17 g by mouth daily as needed for mild constipation. [provider] Taking Active Child  tiZANidine (ZANAFLEX) 4 MG tablet [id number] Yes TAKE 1 TABLET(4 MG) BY MOUTH TWICE DAILY  Patient taking differently: Take 4 mg by mouth in the morning and at bedtime.   [name], MD Taking Active   traMADol Veatrice Bourbon) 50 MG tablet [id number] Yes Take 1-2 tablets (50-100 mg total) by mouth every 6 (six) hours as needed. [name], MD Taking Active   triamcinolone lotion (KENALOG) 0.1 % [id number] Yes Apply 1 application topically 2 (two) times daily as needed (itching). [name], MD Taking Active   vitamin B-12 (CYANOCOBALAMIN) 500 MCG tablet [id number] Yes Take 500 mcg by mouth daily. [provider] Taking Active Child  vitamin C (ASCORBIC ACID) 500 MG tablet [id number] Yes Take 500 mg by mouth daily.  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   Chronic diastolic CHF (congestive heart failure) ([place])    Ventral hernia without obstruction or gangrene 06/06/2015   Renal mass    Pulmonary HTN ([place]) 01/01/2014   AKI (acute kidney injury) ([place]) 01/01/2014   Iron deficiency anemia 01/01/2014   Atrial flutter ([place]) 12/31/2013   Encounter for ostomy care education 11/30/2013   Stoma dermatitis 11/30/2013   Incisional  hernia 11/30/2013   Constipation, chronic 11/30/2013   Charcot's joint of foot 07/07/2013   Bilateral leg edema 04/20/2013   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 11/28/2012 01/05/2013   Rheumatoid arthritis ([place]) 06/07/2009   THYROID NODULE 07/01/2007   LUNG NODULE 07/01/2007   Osteoporosis 05/07/2007   ABUSE, ALCOHOL, 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/12/2007   Blindness 04/12/2007   HEMORRHOIDS, INTERNAL 04/12/2007   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/14/2007   Hyperlipidemia 01/07/2007   Essential hypertension 01/07/2007   GERD 01/07/2007   Marfan's syndrome 01/07/2007   Stroke ([place]) 07/2006   Chronic gout 2008    Immunization History  Administered Date(s) Administered   DTaP 10/07/2005   Fluad Quad(high Dose 65+) 04/20/2019   Influenza Split 04/30/2011, 04/16/2012   Influenza Whole 04/25/2002, 04/03/2010   Influenza,inj,Quad PF,6+ Mos 03/23/2013, 05/09/2015, 04/15/2017, 04/21/2018, 06/20/2020   Influenza-Unspecified 04/26/2014, 04/24/2021   Pneumococcal Conjugate-13 07/18/2015   Pneumococcal Polysaccharide-23 04/25/2002, 04/03/2010   Tdap 09/04/2010   Patient wanted to have someone come out to help with Orencia injections and ostomy bag.  Recommended discussing with PCP tomorrow during video visit.  Patient's daughter is going to start chemo and she cannot help as she was doing before. Her daughter was diagnosed with breast cancer and they just put the port line in today. Conditions to be addressed/monitored:  Hypertension, Hyperlipidemia, GERD, Osteoporosis, Gout and RA and pain  Conditions addressed this visit: Hypertension, hyperlipidemia  Care Plan : [place]  Updates made by Viona Gilmore, [place] since 05/05/2021 12:00 AM     Problem: Problem:  Hypertension, Hyperlipidemia, GERD, Osteoporosis, Gout and RA and pain      Long-Range Goal: Patient-Specific Goal   Start Date: 11/08/2020  Expected End Date: 11/08/2021  Recent Progress: On track  Priority:  High  Note:   Current Barriers:  Unable to independently monitor therapeutic efficacy Unable to achieve control of cholesterol   Pharmacist Clinical Goal(s):  Patient will achieve adherence to monitoring guidelines and medication adherence to achieve therapeutic efficacy achieve control of cholesterol as evidenced by next lipid panel  through collaboration with PharmD and provider.   Interventions: 1:1 collaboration with [name], MD regarding development and update of comprehensive plan of care as evidenced by provider attestation and co-signature Inter-disciplinary care team collaboration (see longitudinal plan of care) Comprehensive medication review performed; medication list updated in electronic medical record  Hypertension (BP goal <130/80) -Controlled -Current treatment: Amlodipine 10mg ,1 tablet once daily  -Medications previously tried: metoprolol (patient preference)  -Current home readings: recommended checking once BP; wrist BP cuff - she bought an arm cuff but it is too big for her arm - 106/71; usually under 120/80 (wrist cuff) -Current dietary habits: did not discuss -Current exercise habits: limited with use of a wheelchair -Denies hypotensive/hypertensive symptoms -Educated on BP goals and benefits of medications for prevention of heart attack, stroke and kidney damage; Importance of home blood pressure monitoring; Proper BP monitoring technique; -Counseled to monitor BP at home weekly, document, and provide log at future appointments -Counseled on diet and exercise extensively Recommended to continue current medication  Hyperlipidemia: (LDL goal < 70) -Uncontrolled -Current treatment: No medications -Medications previously tried: simvastatin (myalgias)   -Current dietary patterns: uses oil when cooking; eating a lot of fried fast food due to kids brining her food and she cannot cook -Current exercise habits: limited -Educated on Cholesterol goals;   Benefits of statin for ASCVD risk reduction; Importance of limiting foods high in cholesterol; -Recommended starting statin therapy based on history of TIA and LDL goal < 70.  History of TIA (Goal: prevent future events) -Uncontrolled -Current treatment  Aspirin 81mg , 1 tablet once daily -Medications previously tried: none  -Recommended to continue current medication   Osteoporosis (Goal prevent fractures) -Not ideally controlled -Last DEXA Scan: 10/05/19  T-Score femoral neck: -1.9, -2.9  T-Score total hip: n/a  T-Score lumbar spine: n/a  T-Score forearm radius: -4.2  10-year probability of major osteoporotic fracture: n/a  10-year probability of hip fracture: n/a -Patient is a candidate for pharmacologic treatment due to T-Score < -2.5 in femoral neck -Current treatment  Prolia injection every 6 months (started 02/2020)    Cholecalciferol (vitamin D) 2000 units, 1 capsule once daily Calcium 1200 mg, 1 tablet twice daily -Medications previously tried: none  -Recommend [phone] units of vitamin D daily. Recommend 1200 mg of calcium daily from dietary and supplemental sources. Recommend weight-bearing and muscle strengthening exercises for building and maintaining bone density. -Counseled on diet and exercise extensively Recommended to continue current medication Recommended reminding dentist about Prolia therapy.  Gout (Goal: prevent flare ups and uric acid < 6) -Controlled -Current treatment  Allopurinol 100mg , 1 tablet once daily  -Medications previously tried: none  -Recommended to continue current medication  GERD (Goal: minimize symptoms) -Controlled -Current treatment  Esomeprazole 40mg , 1 capsule once daily -Medications previously tried: none  -Counseled on non-pharmacological interventions for acid reflux. Take measures to prevent acid reflux, such as avoiding spicy foods, avoiding caffeine, avoid laying down a few hours after eating, and raising the head of the  bed.  Rheumatoid arthritis (Goal: minimize symptoms) -Controlled -Current treatment  Methotrexate injection, inject 0.5MLs into muscle every Monday  Folic acid 060OKH, 1 tablet once daily Orencia 18m, inject 121mevery Monday  -Medications previously tried: n/a  -Recommended to continue current medication  Pain (Goal: minimize pain) -Not ideally controlled -Current treatment  Hydrocodone/ APAP 10/32544m1 tablet every six hours as needed for moderate pain (will take if tramadol doesn't work)  Diclofenac 1% gel, apply topically four times daily as needed Tramadol 69m37m to 2 tablets every six hours as needed  (takes at least 2x/ day) Tizanidine 4mg,30mtablet twice daily  -Medications previously tried: n/a  -Recommended to continue current medication   Health Maintenance -Vaccine gaps: shingles, tetanus, COVID  -Current therapy:  Cetirizine 10mg,58mablet once daily Docusate 100mg, 62mpsule twice daily  keoconazole 2% cream, apply once daily for irritation Metoclopramide 10mg,1 36met every six hours  Multivitamin tablets, 1 tablet once daily Miralax, 17 g daily as needed for constipation ( not everyday bc does not have someone to help change bag)  Triamcinolone 00025% cream, apply daily as needed for itching Vitamin B12 500mcg, 124mlet once daily Vitamin C 500mg, 1 t69mt once daily  -Educated on Cost vs benefit of each product must be carefully weighed by individual consumer -Patient is satisfied with current therapy and denies issues -Recommended to continue current medication  Patient Goals/Self-Care Activities Patient will:  - take medications as prescribed check blood pressure weekly, document, and provide at future appointments target a minimum of 150 minutes of moderate intensity exercise weekly  Follow Up Plan: Telephone follow up appointment with care management team member scheduled for: 6 months        Medication Assistance: None required.  Patient  affirms current coverage meets needs.  Compliance/Adherence/Medication fill history: Care Gaps: Eye exam, foot exam, shingrix, tetanus, A1c, Prevnar 20, Hep C screening, urine microalbumin Last A1C - 5.6  Last BP - 142/80 on 03/07/2021  Star-Rating Drugs: None  Patient's preferred pharmacy is:  [facility] [address] [phone] [id number] - [id number] [facility] [phone] box? Yes  Pt endorses 99% compliance  We discussed: Benefits of medication synchronization, packaging and delivery as well as enhanced pharmacist oversight with [employer]. Patient decided to: Continue current medication management strategy  Care Plan and Follow Up Patient Decision:  Patient agrees to Care Plan and Follow-up.  Plan: Telephone follow up appointment with care management team member scheduled for:  6 months  Jacobe Study PJeni SallesCACP [facility] [phone]

## 2021-05-03 ENCOUNTER — Telehealth (INDEPENDENT_AMBULATORY_CARE_PROVIDER_SITE_OTHER): Payer: Medicare Other | Admitting: Family Medicine

## 2021-05-03 ENCOUNTER — Encounter: Payer: Self-pay | Admitting: Family Medicine

## 2021-05-03 DIAGNOSIS — M069 Rheumatoid arthritis, unspecified: Secondary | ICD-10-CM | POA: Diagnosis not present

## 2021-05-03 DIAGNOSIS — Z433 Encounter for attention to colostomy: Secondary | ICD-10-CM | POA: Diagnosis not present

## 2021-05-03 DIAGNOSIS — F1190 Opioid use, unspecified, uncomplicated: Secondary | ICD-10-CM | POA: Diagnosis not present

## 2021-05-03 MED ORDER — TRIAMCINOLONE ACETONIDE 0.1 % EX CREA: 1.0000 "application " | TOPICAL_CREAM | Freq: Two times a day (BID) | CUTANEOUS | 5 refills | Status: DC

## 2021-05-03 MED ORDER — HYDROCODONE-ACETAMINOPHEN 10-325 MG PO TABS: 1.0000 | ORAL_TABLET | Freq: Four times a day (QID) | ORAL | 0 refills | Status: DC | PRN

## 2021-05-03 NOTE — Progress Notes (Signed)
   Subjective:    Patient ID: [name], female    DOB: 12-05-1955, 64 y.o.   MRN: [id number]  HPI Virtual Visit via Telephone Note  I connected with the patient on 05/03/21 at  1:30 PM EST by telephone and verified that I am speaking with the correct person using two identifiers.   I discussed the limitations, risks, security and privacy concerns of performing an evaluation and management service by telephone and the availability of in person appointments. I also discussed with the patient that there may be a patient responsible charge related to this service. The patient expressed understanding and agreed to proceed.  Location patient: home Location provider: work or home office Participants present for the call: patient, provider Patient did not have a visit in the prior 7 days to address this/these issue(s).   History of Present Illness: Here for pain management, she is doing well.    Observations/Objective: Patient sounds cheerful and well on the phone. I do not appreciate any SOB. Speech and thought processing are grossly intact. Patient reported vitals:  Assessment and Plan: Pain management. Indication for chronic opioid: RA Medication and dose: Norco 10-325 # pills per month: 120 Last UDS date: 06-21-20 Opioid Treatment Agreement signed (Y/N): 07-12-17 Opioid Treatment Agreement last reviewed with patient:  05-03-21 NCCSRS reviewed this encounter (include red flags): Yes Meds were refilled.  [name], MD   Follow Up Instructions:     [phone] [phone] 11-20 [phone] 21-30 I did not refer this patient for an OV in the next 24 hours for this/these issue(s).  I discussed the assessment and treatment plan with the patient. The patient was provided an opportunity to ask questions and all were answered. The patient agreed with the plan and demonstrated an understanding of the instructions.   The patient was advised to call back or seek an in-person evaluation if  the symptoms worsen or if the condition fails to improve as anticipated.  I provided  14 minutes of non-face-to-face time during this encounter.   [name], MD     Review of Systems     Objective:   Physical Exam        Assessment & Plan:

## 2021-05-03 NOTE — Addendum Note (Signed)
Addended by: Wyvonne Lenz on: 05/03/2021 02:44 PM   Modules accepted: Orders

## 2021-05-05 ENCOUNTER — Telehealth: Payer: Self-pay

## 2021-05-05 ENCOUNTER — Other Ambulatory Visit: Payer: Self-pay

## 2021-05-05 MED ORDER — POLYETHYLENE GLYCOL 3350 17 G PO PACK: 17.0000 g | PACK | Freq: Every day | ORAL | 1 refills | Status: AC | PRN

## 2021-05-05 NOTE — Telephone Encounter (Signed)
Done

## 2021-05-16 DIAGNOSIS — Z933 Colostomy status: Secondary | ICD-10-CM | POA: Diagnosis not present

## 2021-05-17 DIAGNOSIS — R5381 Other malaise: Secondary | ICD-10-CM | POA: Diagnosis not present

## 2021-05-24 ENCOUNTER — Other Ambulatory Visit: Payer: Self-pay | Admitting: Family Medicine

## 2021-05-24 DIAGNOSIS — I10 Essential (primary) hypertension: Secondary | ICD-10-CM | POA: Diagnosis not present

## 2021-05-24 DIAGNOSIS — M069 Rheumatoid arthritis, unspecified: Secondary | ICD-10-CM

## 2021-06-01 ENCOUNTER — Other Ambulatory Visit: Payer: Self-pay | Admitting: Family Medicine

## 2021-06-05 DIAGNOSIS — M0579 Rheumatoid arthritis with rheumatoid factor of multiple sites without organ or systems involvement: Secondary | ICD-10-CM | POA: Diagnosis not present

## 2021-06-06 DIAGNOSIS — Z1231 Encounter for screening mammogram for malignant neoplasm of breast: Secondary | ICD-10-CM | POA: Diagnosis not present

## 2021-06-06 LAB — HM MAMMOGRAPHY

## 2021-06-09 ENCOUNTER — Telehealth: Payer: Self-pay

## 2021-06-09 ENCOUNTER — Encounter: Payer: Self-pay | Admitting: Family Medicine

## 2021-06-09 DIAGNOSIS — Z933 Colostomy status: Secondary | ICD-10-CM | POA: Diagnosis not present

## 2021-06-09 NOTE — Telephone Encounter (Signed)
Please advise 

## 2021-06-09 NOTE — Telephone Encounter (Signed)
Bethann Punches call from [facility] asking for home health orders [phone] she will be aslo faxing documents to be signed

## 2021-06-12 NOTE — Telephone Encounter (Signed)
The form is ready to fax

## 2021-06-14 NOTE — Telephone Encounter (Signed)
Pt Form was faxed to [facility], confirmation received

## 2021-06-15 DIAGNOSIS — R5381 Other malaise: Secondary | ICD-10-CM | POA: Diagnosis not present

## 2021-06-28 ENCOUNTER — Telehealth: Payer: Self-pay | Admitting: Pharmacist

## 2021-06-28 NOTE — Chronic Care Management (AMB) (Signed)
Chronic Care Management Pharmacy Assistant   Name: [name]  MRN: [id number] DOB: [birth year]  Reason for Encounter: Disease State / Hypertension Assessment Call   Conditions to be addressed/monitored: HTN  Recent office visits:  05/03/2021 Christina Penna MD - Patient was seen for chronic narcotic use and additional issues. Started Norco 10/325 1 tablet every 6 hours as needed. No follow up noted.  Recent consult visits:  None  Hospital visits:  None  Medications: Outpatient Encounter Medications as of 06/28/2021  Medication Sig   Abatacept 125 MG/ML SOSY Inject 125 mg into the skin every Monday. tuesday   albuterol (VENTOLIN HFA) 108 (90 Base) MCG/ACT inhaler INHALE 1 PUFF INTO THE LUNGS EVERY 4 HOURS AS NEEDED FOR WHEEZING OR SHORTNESS OF BREATH   allopurinol (ZYLOPRIM) 100 MG tablet TAKE 1 TABLET BY MOUTH EVERY DAY   amLODipine (NORVASC) 10 MG tablet TAKE 1 TABLET BY MOUTH EVERY DAY   aspirin EC 81 MG tablet Take 1 tablet (81 mg total) by mouth daily.   CALCIUM PO Take 1 tablet by mouth 2 (two) times daily.   cetirizine (ZYRTEC) 10 MG tablet Take 1 tablet (10 mg total) by mouth daily.   Cholecalciferol (VITAMIN D) 2000 units CAPS Take 2,000 Units by mouth daily.   denosumab (PROLIA) 60 MG/ML SOSY injection Inject 60 mg into the skin every 6 (six) months.   diazepam (VALIUM) 2 MG tablet Take 1 tablet (2 mg total) by mouth every 12 (twelve) hours as needed for anxiety.   diclofenac sodium (VOLTAREN) 1 % GEL Apply 1 application topically 4 (four) times daily as needed (arthritis).   docusate sodium (COLACE) 100 MG capsule Take 100 mg by mouth 2 (two) times daily.   esomeprazole (NEXIUM) 40 MG capsule TAKE 1 CAPSULE(40 MG) BY MOUTH DAILY IN THE AFTERNOON   folic acid (FOLVITE) 101 MCG tablet Take 400 mcg by mouth daily.   [START ON 07/03/2021] HYDROcodone-acetaminophen (NORCO) 10-325 MG tablet Take 1 tablet by mouth every 6 (six) hours as needed for moderate pain.    ketoconazole (NIZORAL) 2 % shampoo Apply 1 application topically 2 (two) times a week.   megestrol (MEGACE ES) 625 MG/5ML suspension take 5 milliliters by mouth three times daily before meals as needed for appetite (Patient taking differently: Take 625 mg by mouth daily as needed (appetite).)   Methotrexate Sodium (METHOTREXATE, PF,) 50 MG/2ML injection Inject 0.5 mLs into the muscle once a week. Tues   Multiple Vitamin (MULTIVITAMIN WITH MINERALS) TABS tablet Take 1 tablet by mouth daily.   PERIDEX 0.12 % solution Use as directed 15 mLs in the mouth or throat 2 (two) times daily.    polyethylene glycol (MIRALAX / GLYCOLAX) 17 g packet Take 17 g by mouth daily as needed for mild constipation.   tiZANidine (ZANAFLEX) 4 MG tablet TAKE 1 TABLET(4 MG) BY MOUTH TWICE DAILY   traMADol (ULTRAM) 50 MG tablet Take 1-2 tablets (50-100 mg total) by mouth every 6 (six) hours as needed.   triamcinolone cream (KENALOG) 0.1 % Apply 1 application topically 2 (two) times daily.   vitamin B-12 (CYANOCOBALAMIN) 500 MCG tablet Take 500 mcg by mouth daily.   vitamin C (ASCORBIC ACID) 500 MG tablet Take 500 mg by mouth daily.    No facility-administered encounter medications on file as of 06/28/2021.  Fill History: ORENCIA  125 MG/ML SOSY 05/29/2021 28   ALBUTEROL HFA INH (200 PUFFS)8.5GM 04/05/2021 33   ALLOPURINOL 100MG  TABLETS 06/01/2021 90   ESOMEPRAZOLE  MAGNESIUM 40MG  DR CAPS 05/30/2021 90   HYDROCODONE/ACETAMINOPHEN 10-325 T 06/05/2021 30   METHOTREXATE SODIUM  50 MG/2ML SOLN 06/27/2021 28   POLYETH GLYC 3350 NF PWDR PACKS 14S 05/05/2021 14   TRIAMCINOLONE 0.1% CREAM 454GM 05/03/2021 30   AMLODIPINE BESYLATE 10MG  TABLETS 06/03/2021 90   TIZANIDINE 4MG  TABLETS 05/25/2021 45   TRAMADOL 50MG  TABLETS 06/05/2021 30   Reviewed chart prior to disease state call. Spoke with patient regarding BP  Recent Office Vitals: BP Readings from Last 3 Encounters:  01/18/21 119/78  12/20/20 126/78  07/09/20 (!)  141/83   Pulse Readings from Last 3 Encounters:  12/20/20 76  07/09/20 70  06/20/20 86    Wt Readings from Last 3 Encounters:  12/20/20 150 lb (68 kg)  10/21/20 150 lb (68 kg)  07/05/20 151 lb 0.2 oz (68.5 kg)     Kidney Function Lab Results  Component Value Date/Time   CREATININE 0.70 01/16/2021 11:45 AM   CREATININE 0.70 07/06/2020 03:18 AM   CREATININE 0.76 11/19/2017 10:24 AM   GFR 107.53 04/20/2019 12:02 PM   GFRNONAA >60 07/06/2020 03:18 AM   GFRNONAA 85 11/19/2017 10:24 AM   GFRAA 98 11/19/2017 10:24 AM    BMP Latest Ref Rng & Units 01/16/2021 12/20/2020 07/06/2020  Glucose 70 - 99 mg/dL - - 89  BUN 8 - 23 mg/dL - - 15  Creatinine 0.44 - 1.00 mg/dL 0.70 - 0.70  Sodium 135 - 145 mmol/L - - 146(H)  Potassium 3.5 - 5.1 mmol/L - - 3.9  Chloride 98 - 111 mmol/L - - 107  CO2 22 - 32 mmol/L - - 26  Calcium 8.6 - 10.4 mg/dL - 9.8 8.2(L)    Current antihypertensive regimen:  Amlodipine 10 mg - take 1 tablet daily  How often are you checking your Blood Pressure?Patient has been checking her blood pressures daily for the past couple weeks.   Current home BP readings: Patient doesn't have any blood pressures recorded however, she states they have all been under 120/80.   What recent interventions/DTPs have been made by any provider to improve Blood Pressure control since last CPP Visit: No recent changes.  Any recent hospitalizations or ED visits since last visit with CPP? No recent hospital visits.  What diet changes have been made to improve Blood Pressure Control?  Patient denies any diet changes.  She eats one meal a day and it is generally a sandwich, chips with a soda.  What exercise is being done to improve your Blood Pressure Control?  Patient denies any regular exercise, she does do some stretching daily and house work.   Adherence Review: Is the patient currently on ACE/ARB medication? No Does the patient have >5 day gap between last estimated fill dates?  No  Care Gaps: AWV - scheduled for 10/11/21 Last BP - 119/78 on 01/18/2021 Foot exam - never done Ophthalmology exam - never done Urine microalbumin - never done  Hepatitis C screening - never done Zoster vaccines - never done Hemoglobin A1C - overdue since 2017 Tetanus/ TDAP - overdue since 09/03/20 Pneumonia vaccine - due soon  Star Rating Drugs: None  [place] Pharmacist Assistant [phone]

## 2021-06-30 ENCOUNTER — Telehealth: Payer: Self-pay

## 2021-06-30 NOTE — Telephone Encounter (Signed)
--  Caller states her blood pressure is 96/64. Has had HA for 2 weeks. Went to Dentist yesterday, had 8 teeth pulled. Taking Tramadol BID.  06/27/2021 5:29:43 PM See PCP within 24 Hours [place], RN, [place]  06/30/21 1253: LVM letting pt know we were calling to check on her; instructions to return call. Please schedule pt for in person appt with PCP for annual exam. Pt will need blood pressure evaluated as she has not been in the office since 06/20/20.

## 2021-07-03 NOTE — Telephone Encounter (Signed)
Prolia VOB initiated for 2023. °

## 2021-07-04 ENCOUNTER — Telehealth: Payer: Self-pay | Admitting: Pharmacist

## 2021-07-04 NOTE — Chronic Care Management (AMB) (Signed)
° ° °  Chronic Care Management Pharmacy Assistant   Name: [name]  MRN: [id number] DOB: January 12, 1956  Reason for Encounter: Follow up blood pressure and note from 06/30/2021.  Spoke with patient, she states she has been feeling fine, her headache is gone and her home blood pressure readings have been around 120/80. She does not want to schedule an appointment at this time, she has an appointment on 07/25/2021 with Dr. Sarajane Jews and will review this with him then. Patient states that she had recently had 8 teeth pulled and feels that may have been the cause of her symptoms.    Care Gaps: AWV - scheduled for 10/11/21 Last BP - 119/78 on 01/18/2021 Foot exam - never done Ophthalmology exam - never done Urine microalbumin - never done  Hepatitis C screening - never done Zoster vaccines - never done Hemoglobin A1C - overdue since 2017 Tetanus/ TDAP - overdue since 09/03/20 Pneumonia vaccine - due soon   Star Rating Drugs: None  [place] Pharmacist Assistant [phone]

## 2021-07-08 DIAGNOSIS — Z933 Colostomy status: Secondary | ICD-10-CM | POA: Diagnosis not present

## 2021-07-10 ENCOUNTER — Emergency Department (HOSPITAL_COMMUNITY)
Admission: EM | Admit: 2021-07-10 | Discharge: 2021-07-10 | Disposition: A | Discharge status: Home / Self Care | Payer: Medicare Other | Attending: Emergency Medicine | Admitting: Emergency Medicine

## 2021-07-10 ENCOUNTER — Encounter (HOSPITAL_COMMUNITY): Payer: Self-pay

## 2021-07-10 ENCOUNTER — Other Ambulatory Visit: Payer: Self-pay

## 2021-07-10 ENCOUNTER — Emergency Department (HOSPITAL_COMMUNITY): Payer: Medicare Other

## 2021-07-10 ENCOUNTER — Emergency Department (HOSPITAL_BASED_OUTPATIENT_CLINIC_OR_DEPARTMENT_OTHER)
Admit: 2021-07-10 | Discharge: 2021-07-10 | Disposition: A | Discharge status: Home / Self Care | Payer: Medicare Other | Attending: Emergency Medicine | Admitting: Emergency Medicine

## 2021-07-10 DIAGNOSIS — K439 Ventral hernia without obstruction or gangrene: Secondary | ICD-10-CM | POA: Insufficient documentation

## 2021-07-10 DIAGNOSIS — Z743 Need for continuous supervision: Secondary | ICD-10-CM | POA: Diagnosis not present

## 2021-07-10 DIAGNOSIS — M79604 Pain in right leg: Secondary | ICD-10-CM | POA: Diagnosis not present

## 2021-07-10 DIAGNOSIS — R1031 Right lower quadrant pain: Secondary | ICD-10-CM | POA: Diagnosis not present

## 2021-07-10 DIAGNOSIS — M549 Dorsalgia, unspecified: Secondary | ICD-10-CM | POA: Diagnosis not present

## 2021-07-10 DIAGNOSIS — Z7982 Long term (current) use of aspirin: Secondary | ICD-10-CM | POA: Diagnosis not present

## 2021-07-10 DIAGNOSIS — R109 Unspecified abdominal pain: Secondary | ICD-10-CM | POA: Diagnosis not present

## 2021-07-10 DIAGNOSIS — I700 Atherosclerosis of aorta: Secondary | ICD-10-CM | POA: Diagnosis not present

## 2021-07-10 DIAGNOSIS — R6889 Other general symptoms and signs: Secondary | ICD-10-CM | POA: Diagnosis not present

## 2021-07-10 DIAGNOSIS — M79651 Pain in right thigh: Secondary | ICD-10-CM | POA: Diagnosis not present

## 2021-07-10 LAB — COMPREHENSIVE METABOLIC PANEL
ALT: 18 U/L (ref 0–44)
AST: 24 U/L (ref 15–41)
Albumin: 3.9 g/dL (ref 3.5–5.0)
Alkaline Phosphatase: 95 U/L (ref 38–126)
Anion gap: 12 (ref 5–15)
BUN: 9 mg/dL (ref 8–23)
CO2: 25 mmol/L (ref 22–32)
Calcium: 9.3 mg/dL (ref 8.9–10.3)
Chloride: 101 mmol/L (ref 98–111)
Creatinine, Ser: 0.85 mg/dL (ref 0.44–1.00)
GFR, Estimated: >60 mL/min (ref >60)
Glucose, Bld: 91 mg/dL (ref 70–99)
Potassium: 3.9 mmol/L (ref 3.5–5.1)
Sodium: 138 mmol/L (ref 135–145)
Total Bilirubin: 1.1 mg/dL (ref 0.3–1.2)
Total Protein: 7.2 g/dL (ref 6.5–8.1)

## 2021-07-10 LAB — CBC WITH DIFFERENTIAL/PLATELET
Abs Immature Granulocytes: 0.01 10*3/uL (ref 0.00–0.07)
Basophils Absolute: 0 10*3/uL (ref 0.0–0.1)
Basophils Relative: 0 %
Eosinophils Absolute: 0.1 10*3/uL (ref 0.0–0.5)
Eosinophils Relative: 2 %
HCT: 36.5 % (ref 36.0–46.0)
Hemoglobin: 12.1 g/dL (ref 12.0–15.0)
Immature Granulocytes: 0 %
Lymphocytes Relative: 16 %
Lymphs Abs: 1.1 10*3/uL (ref 0.7–4.0)
MCH: 31.8 pg (ref 26.0–34.0)
MCHC: 33.2 g/dL (ref 30.0–36.0)
MCV: 95.8 fL (ref 80.0–100.0)
Monocytes Absolute: 0.7 10*3/uL (ref 0.1–1.0)
Monocytes Relative: 10 %
Neutro Abs: 4.8 10*3/uL (ref 1.7–7.7)
Neutrophils Relative %: 72 %
Platelets: 311 10*3/uL (ref 150–400)
RBC: 3.81 MIL/uL — ABNORMAL LOW (ref 3.87–5.11)
RDW: 14 % (ref 11.5–15.5)
WBC: 6.7 10*3/uL (ref 4.0–10.5)
nRBC: 0 % (ref 0.0–0.2)

## 2021-07-10 LAB — URINALYSIS, ROUTINE W REFLEX MICROSCOPIC
Bilirubin Urine: NEGATIVE
Glucose, UA: NEGATIVE mg/dL
Hgb urine dipstick: NEGATIVE
Ketones, ur: 20 mg/dL — AB
Nitrite: NEGATIVE
Protein, ur: NEGATIVE mg/dL
Specific Gravity, Urine: 1.03 (ref 1.005–1.030)
pH: 5 (ref 5.0–8.0)

## 2021-07-10 LAB — LIPASE, BLOOD: Lipase: 45 U/L (ref 11–51)

## 2021-07-10 MED ORDER — IOHEXOL 300 MG/ML  SOLN
80.0000 mL | Freq: Once | INTRAMUSCULAR | Status: AC | PRN
  Administered 2021-07-10: 80 mL via INTRAVENOUS

## 2021-07-10 MED ORDER — MORPHINE SULFATE (PF) 4 MG/ML IV SOLN
4.0000 mg | Freq: Once | INTRAVENOUS | Status: AC
  Administered 2021-07-10: 4 mg via INTRAVENOUS
  Filled 2021-07-10: qty 1

## 2021-07-10 MED ORDER — SODIUM CHLORIDE (PF) 0.9 % IJ SOLN
INTRAMUSCULAR | Status: AC
  Filled 2021-07-10: qty 50

## 2021-07-10 NOTE — ED Triage Notes (Signed)
Pt to er room number 5 via ems, per ems pt is here for R groin pain radiating into her abd, pt reports hx of hernia and sbo.

## 2021-07-10 NOTE — Discharge Instructions (Addendum)
CT abdomen and pelvis demonstrates no acute process. Stable liver, pancreas, and renal function on labs. Ultrasound demonstrates no DVT in right leg.  No signs of infection. No rashes/wounds.  Follow up with primary care if pain continues

## 2021-07-10 NOTE — ED Provider Notes (Signed)
[facility] DEPT Provider Note   CSN: [id number] Arrival date & time: 07/10/21  1006     History  Chief Complaint  Patient presents with   Abdominal Pain    [name] is a 66 y.o. female.  Patient is a 66 yo female presenting for RLQ abdominal pain radiating into the right proximal medial thigh. No rashes/lesions. Denies falls or injuries. States pain is radiating to right back. Denies hx of renal stones or discolored urine. Denies hematuria, increased frequency, or urgency.  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 Pt also has large abdominal hernia.   The history is provided by the patient. No language interpreter was used.  Abdominal Pain Associated symptoms: no chest pain, no chills, no cough, no dysuria, no fever, no hematuria, no shortness of breath, no sore throat and no vomiting       Home Medications Prior to Admission medications   Medication Sig Start Date End Date Taking? Authorizing Provider  Abatacept 125 MG/ML SOSY Inject 125 mg into the skin every Monday. tuesday    [provider]  albuterol (VENTOLIN HFA) 108 (90 Base) MCG/ACT inhaler INHALE 1 PUFF INTO THE LUNGS EVERY 4 HOURS AS NEEDED FOR WHEEZING OR SHORTNESS OF BREATH 11/11/20   [name], MD  allopurinol (ZYLOPRIM) 100 MG tablet TAKE 1 TABLET BY MOUTH EVERY DAY 06/01/21   [name], MD  amLODipine (NORVASC) 10 MG tablet TAKE 1 TABLET BY MOUTH EVERY DAY 03/03/21   [name], MD  aspirin EC 81 MG tablet Take 1 tablet (81 mg total) by mouth daily. 05/11/14   Verlee Monte, MD  CALCIUM PO Take 1 tablet by mouth 2 (two) times daily.    [provider]  cetirizine (ZYRTEC) 10 MG tablet Take 1 tablet (10 mg total) by mouth daily. 11/17/14   [name], MD  Cholecalciferol (VITAMIN D) 2000 units CAPS Take 2,000 Units by mouth daily.    [provider]  denosumab (PROLIA) 60 MG/ML SOSY injection  Inject 60 mg into the skin every 6 (six) months.    [provider]  diazepam (VALIUM) 2 MG tablet Take 1 tablet (2 mg total) by mouth every 12 (twelve) hours as needed for anxiety. 07/22/20   [name], MD  diclofenac sodium (VOLTAREN) 1 % GEL Apply 1 application topically 4 (four) times daily as needed (arthritis).    [provider]  docusate sodium (COLACE) 100 MG capsule Take 100 mg by mouth 2 (two) times daily.    [provider]  esomeprazole (NEXIUM) 40 MG capsule TAKE 1 CAPSULE(40 MG) BY MOUTH DAILY IN THE AFTERNOON 05/24/21   [name], MD  folic acid (FOLVITE) 213 MCG tablet Take 400 mcg by mouth daily.    [provider]  HYDROcodone-acetaminophen (NORCO) 10-325 MG tablet Take 1 tablet by mouth every 6 (six) hours as needed for moderate pain. 07/03/21 08/02/21  [name], MD  ketoconazole (NIZORAL) 2 % shampoo Apply 1 application topically 2 (two) times a week. 03/04/20   [provider]  megestrol (MEGACE ES) 625 MG/5ML suspension take 5 milliliters by mouth three times daily before meals as needed for appetite Patient taking differently: Take 625 mg by mouth daily as needed (appetite). 05/17/20   [name], MD  Methotrexate Sodium (METHOTREXATE, PF,) 50 MG/2ML injection Inject 0.5 mLs into the muscle once a week. Tues 05/14/16   [provider]  Multiple Vitamin (MULTIVITAMIN WITH MINERALS) TABS tablet Take 1 tablet by mouth daily.    [provider]  PERIDEX 0.12 % solution Use as directed 15 mLs in the mouth or throat 2 (two) times daily.  03/23/20   [provider]  polyethylene glycol (MIRALAX / GLYCOLAX) 17 g packet Take 17 g by mouth daily as needed for mild constipation. 05/05/21   [name], MD  tiZANidine (ZANAFLEX) 4 MG tablet TAKE 1 TABLET(4 MG) BY MOUTH TWICE DAILY 05/24/21   [name], MD  traMADol (ULTRAM) 50 MG tablet Take 1-2 tablets (50-100 mg total) by mouth every 6 (six)  hours as needed. 01/18/21   [name], MD  triamcinolone cream (KENALOG) 0.1 % Apply 1 application topically 2 (two) times daily. 05/03/21   [name], MD  vitamin B-12 (CYANOCOBALAMIN) 500 MCG tablet Take 500 mcg by mouth daily.    [provider]  vitamin C (ASCORBIC ACID) 500 MG tablet Take 500 mg by mouth daily.     [provider]      Allergies    Spinach    Review of Systems   Review of Systems  Constitutional:  Negative for chills and fever.  HENT:  Negative for ear pain and sore throat.   Eyes:  Negative for pain and visual disturbance.  Respiratory:  Negative for cough and shortness of breath.   Cardiovascular:  Negative for chest pain and palpitations.  Gastrointestinal:  Positive for abdominal pain. Negative for vomiting.  Genitourinary:  Negative for dysuria and hematuria.  Musculoskeletal:  Positive for back pain. Negative for arthralgias.  Skin:  Negative for color change and rash.  Neurological:  Negative for seizures and syncope.  All other systems reviewed and are negative.  Physical Exam Updated Vital Signs BP 132/88 (BP Location: Left Arm)    Pulse (!) 107    Temp 98.5 F (36.9 C) (Oral)    Resp 18    Ht 5\' 8"  (1.727 m)    Wt 68 kg    LMP 06/25/2006 (LMP Unknown)    SpO2 98%    BMI 22.81 kg/m  Physical Exam Vitals and nursing note reviewed.  Constitutional:      General: She is not in acute distress.    Appearance: She is well-developed.  HENT:     Head: Normocephalic and atraumatic.  Eyes:     Conjunctiva/sclera: Conjunctivae normal.  Cardiovascular:     Rate and Rhythm: Normal rate and regular rhythm.     Heart sounds: No murmur heard. Pulmonary:     Effort: Pulmonary effort is normal. No respiratory distress.     Breath sounds: Normal breath sounds.  Abdominal:     Palpations: Abdomen is soft.     Tenderness: There is abdominal tenderness in the right lower quadrant.    Musculoskeletal:        General: No swelling.      Cervical back: Neck supple.       Legs:  Skin:    General: Skin is warm and dry.     Capillary Refill: Capillary refill takes less than 2 seconds.  Neurological:     Mental Status: She is alert.  Psychiatric:        Mood and Affect: Mood normal.    ED Results / Procedures / Treatments   Labs (all labs ordered are listed, but only abnormal results are displayed) Labs Reviewed - No data to display  EKG None  Radiology No  results found.  Procedures Procedures    Medications Ordered in ED Medications - No data to display  ED Course/ Medical Decision Making/ A&P                           Medical Decision Making Amount and/or Complexity of Data Reviewed Labs: ordered. Radiology: ordered. ECG/medicine tests: ordered.  Risk Prescription drug management.   10:57 AM 66 yo female presenting for RLQ abdominal pain radiating into the right proximal medial thigh. Pt is Aox3, no acute distress, afebrile, with stable vitals. Physical exam demonstrates soft abdomen with no tenderness. Tenderness to right groin and medial proximal right thigh. No rashes or masses on exam. CT abdomen/pelvis demonstrates no acute process. No abscesses. US demonstrates no DVT.  Patient in no distress and overall condition improved here in the ED. Medication given for pain control. Detailed discussions were had with the patient regarding current findings, and need for close f/u with PCP or on call doctor. The patient has been instructed to return immediately if the symptoms worsen in any way for re-evaluation. Patient verbalized understanding and is in agreement with current care plan. All questions answered prior to discharge.         Final Clinical Impression(s) / ED Diagnoses Final diagnoses:  Right lower quadrant abdominal pain  Right thigh pain    Rx / DC Orders ED Discharge Orders     None         Lianne Cure, DO 35/45/62 [id number]

## 2021-07-10 NOTE — ED Notes (Signed)
Requested urine sample for UA. Pt states she doesn't need to urinate at this time. Will re-attempt at a later time. Per pt, she prefers using a bedpan.

## 2021-07-10 NOTE — ED Notes (Signed)
Pt in bed, pt has ostomy bag in place, pt reports decreased out put.  Resps even and unlabored.  Pt blind, call bell in hand, pt awaits md eval.

## 2021-07-10 NOTE — ED Notes (Signed)
Pt in bed, pt states that she is ready to go home, pt states that her daughter is coming to get her.  Pt verbalized understanding d/c instructions and follow up.

## 2021-07-10 NOTE — Progress Notes (Signed)
Right lower extremity venous duplex has been completed. Preliminary results can be found in CV Proc through chart review.  Results were given to Dr. Pearline Cables.  07/10/21 4:20 PM [name] RVT

## 2021-07-10 NOTE — ED Notes (Signed)
Pt off bed pain, ultrasound at bedside.

## 2021-07-12 DIAGNOSIS — R5381 Other malaise: Secondary | ICD-10-CM | POA: Diagnosis not present

## 2021-07-15 ENCOUNTER — Emergency Department (HOSPITAL_COMMUNITY)
Admission: EM | Admit: 2021-07-15 | Discharge: 2021-07-15 | Disposition: A | Discharge status: Home / Self Care | Payer: Medicare Other | Attending: Emergency Medicine | Admitting: Emergency Medicine

## 2021-07-15 ENCOUNTER — Encounter (HOSPITAL_COMMUNITY): Payer: Self-pay

## 2021-07-15 ENCOUNTER — Other Ambulatory Visit: Payer: Self-pay

## 2021-07-15 DIAGNOSIS — Z85528 Personal history of other malignant neoplasm of kidney: Secondary | ICD-10-CM | POA: Insufficient documentation

## 2021-07-15 DIAGNOSIS — R319 Hematuria, unspecified: Secondary | ICD-10-CM | POA: Insufficient documentation

## 2021-07-15 DIAGNOSIS — Z79899 Other long term (current) drug therapy: Secondary | ICD-10-CM | POA: Diagnosis not present

## 2021-07-15 DIAGNOSIS — Z7982 Long term (current) use of aspirin: Secondary | ICD-10-CM | POA: Diagnosis not present

## 2021-07-15 DIAGNOSIS — K439 Ventral hernia without obstruction or gangrene: Secondary | ICD-10-CM | POA: Insufficient documentation

## 2021-07-15 DIAGNOSIS — Z743 Need for continuous supervision: Secondary | ICD-10-CM | POA: Diagnosis not present

## 2021-07-15 LAB — URINALYSIS, ROUTINE W REFLEX MICROSCOPIC
Bilirubin Urine: NEGATIVE
Glucose, UA: NEGATIVE mg/dL
Hgb urine dipstick: NEGATIVE
Ketones, ur: NEGATIVE mg/dL
Leukocytes,Ua: NEGATIVE
Nitrite: NEGATIVE
Protein, ur: NEGATIVE mg/dL
Specific Gravity, Urine: 1.003 — ABNORMAL LOW (ref 1.005–1.030)
pH: 6 (ref 5.0–8.0)

## 2021-07-15 NOTE — ED Triage Notes (Signed)
Per EMS, patient from home, c/o hematuria since this afternoon. Blind in both eyes. Family noticed patient's urine to be bright red. No blood noted on wipes per family. Hx UTI. Ambulatory with walker.

## 2021-07-15 NOTE — ED Provider Notes (Signed)
[facility] EMERGENCY DEPARTMENT Provider Note    CSN: [id number] Arrival date & time: 07/15/21 1544  History Chief Complaint  Patient presents with   Hematuria    [name] is a 66 y.o. 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 Today she was using the toilet and an aid at the house noticed blood in the toilet. She is blind in both eyes so it isn't clear when that started. She has a ileostomy from prior complicated diverticulitis and does not stool through her rectum. No noted vaginal bleeding. Later in the day she urinated again and there was again blood noted in the toilet but none when she wiped. She denies any dysuria, nausea, vomiting or fevers.    Home Medications Prior to Admission medications   Medication Sig Start Date End Date Taking? Authorizing Provider  Abatacept 125 MG/ML SOSY Inject 125 mg into the skin every Monday. tuesday    [provider]  albuterol (VENTOLIN HFA) 108 (90 Base) MCG/ACT inhaler INHALE 1 PUFF INTO THE LUNGS EVERY 4 HOURS AS NEEDED FOR WHEEZING OR SHORTNESS OF BREATH 11/11/20   [name], MD  allopurinol (ZYLOPRIM) 100 MG tablet TAKE 1 TABLET BY MOUTH EVERY DAY 06/01/21   [name], MD  amLODipine (NORVASC) 10 MG tablet TAKE 1 TABLET BY MOUTH EVERY DAY 03/03/21   [name], MD  aspirin EC 81 MG tablet Take 1 tablet (81 mg total) by mouth daily. 05/11/14   Verlee Monte, MD  CALCIUM PO Take 1 tablet by mouth 2 (two) times daily.    [provider]  cetirizine (ZYRTEC) 10 MG tablet Take 1 tablet (10 mg total) by mouth daily. 11/17/14   [name], MD  Cholecalciferol (VITAMIN D) 2000 units CAPS Take 2,000 Units by mouth daily.    [provider]  denosumab (PROLIA) 60 MG/ML SOSY injection Inject 60 mg into the skin every 6 (six) months.     [provider]  diazepam (VALIUM) 2 MG tablet Take 1 tablet (2 mg total) by mouth every 12 (twelve) hours as needed for anxiety. 07/22/20   [name], MD  diclofenac sodium (VOLTAREN) 1 % GEL Apply 1 application topically 4 (four) times daily as needed (arthritis).    [provider]  docusate sodium (COLACE) 100 MG capsule Take 100 mg by mouth 2 (two) times daily.    [provider]  esomeprazole (NEXIUM) 40 MG capsule TAKE 1 CAPSULE(40 MG) BY MOUTH DAILY IN THE AFTERNOON 05/24/21   [name], MD  folic acid (FOLVITE) 702 MCG tablet Take 400 mcg by mouth daily.    [provider]  HYDROcodone-acetaminophen (NORCO) 10-325 MG tablet Take 1 tablet by mouth every 6 (six) hours as needed for moderate pain. 07/03/21 08/02/21  [name], MD  ketoconazole (NIZORAL) 2 % shampoo Apply 1 application topically 2 (two) times a week. 03/04/20   [provider]  megestrol (MEGACE ES) 625 MG/5ML suspension take 5 milliliters by mouth three times daily before meals as needed for appetite Patient taking differently: Take 625 mg by mouth daily as needed (appetite). 05/17/20   [name], MD  Methotrexate Sodium (METHOTREXATE, PF,) 50 MG/2ML injection Inject 0.5 mLs into the muscle once a week. Tues 05/14/16   [provider]  Multiple Vitamin (MULTIVITAMIN WITH MINERALS) TABS tablet Take 1 tablet by mouth daily.    [provider]  PERIDEX 0.12 % solution Use as directed 15 mLs in the mouth or throat 2 (two) times daily.  03/23/20   [provider]  polyethylene glycol (MIRALAX / GLYCOLAX) 17 g packet Take 17 g by mouth daily as needed for mild constipation. 05/05/21   [name], MD  tiZANidine (ZANAFLEX) 4 MG tablet TAKE 1 TABLET(4 MG) BY MOUTH TWICE DAILY 05/24/21   [name], MD  traMADol (ULTRAM) 50 MG tablet Take 1-2 tablets (50-100 mg total) by mouth every 6 (six) hours as needed. 01/18/21   [name], MD   triamcinolone cream (KENALOG) 0.1 % Apply 1 application topically 2 (two) times daily. 05/03/21   [name], MD  vitamin B-12 (CYANOCOBALAMIN) 500 MCG tablet Take 500 mcg by mouth daily.    [provider]  vitamin C (ASCORBIC ACID) 500 MG tablet Take 500 mg by mouth daily.     [provider]     Allergies    Spinach   Review of Systems   Review of Systems Please see HPI for pertinent positives and negatives  Physical Exam BP 122/90 (BP Location: Left Arm)    Pulse 89    Temp 98.6 F (37 C) (Oral)    Resp 16    Ht 5\' 8"  (1.727 m)    Wt 68 kg    LMP 06/25/2006 (LMP Unknown)    SpO2 99%    BMI 22.81 kg/m   Physical Exam Vitals and nursing note reviewed.  Constitutional:      Appearance: Normal appearance.  HENT:     Head: Normocephalic and atraumatic.     Nose: Nose normal.     Mouth/Throat:     Mouth: Mucous membranes are moist.  Eyes:     Extraocular Movements: Extraocular movements intact.     Conjunctiva/sclera: Conjunctivae normal.  Cardiovascular:     Rate and Rhythm: Normal rate.  Pulmonary:     Effort: Pulmonary effort is normal.     Breath sounds: Normal breath sounds.  Abdominal:     General: Abdomen is flat.     Palpations: Abdomen is soft.     Tenderness: There is no abdominal tenderness.     Comments: Large ventral hernia, not tender. Ostomy in L abdomen.  Genitourinary:    Comments: Chaperone present, no signs of bleeding on external genital and rectal exam Musculoskeletal:        General: No swelling. Normal range of motion.     Cervical back: Neck supple.  Skin:    General: Skin is warm and dry.  Neurological:     General: No focal deficit present.     Mental Status: She is alert.  Psychiatric:        Mood and Affect: Mood normal.    ED Results / Procedures / Treatments   EKG None  Procedures Procedures  Medications Ordered in the ED Medications - No data to display  Initial Impression and Plan  Patient with  recent negative workup, here with reported hematuria at home. No signs of bleeding on external inspection. Will check cath UA to confirm further workup pending those results.   ED Course   Clinical Course as of 07/15/21 1734  Sat Jul 15, 2021  1731 UA is clear today. No signs of infection or blood. Recommend she follow up with Urology if there is  still blood in her urine at home. Given recent negative workup, including CT, no indication for further ED workup at this time.  [CS]    Clinical Course User Index [CS] Truddie Hidden, MD     MDM Rules/Calculators/A&P Medical Decision Making Problems Addressed: Hematuria, unspecified type: self-limited or minor problem  Amount and/or Complexity of Data Reviewed Labs: ordered.    Final Clinical Impression(s) / ED Diagnoses Final diagnoses:  Hematuria, unspecified type    Rx / DC Orders ED Discharge Orders     None        Truddie Hidden, MD 07/15/21 1734

## 2021-07-17 ENCOUNTER — Telehealth: Payer: Self-pay | Admitting: Internal Medicine

## 2021-07-17 NOTE — Telephone Encounter (Signed)
Patient called saying she has concerns about her colostomy, seeing some bleeding that started over the weekend.  She asked for the nurse to give her a call and advise.  Thank you.

## 2021-07-17 NOTE — Telephone Encounter (Signed)
Pt stated that she went  to the ED last Monday and they stated that she has a Bowel Obstruction: Later in the week pt caregiver started seeing blood in the Toilet. The caregiver though it was in the Urine. Pt went back to the ED and sent home after negative workup with Urine. Pt caregiver noticed after that  it was actually a small amount of Mucous that was blood tinged.from her rectum. Pt has a colostomy.  Pt stated that they only seen the blood tinged Mucus Saturday and Sunday. Pt stated that she has not had any today. Pt was notified to monitor the blood tinged mucous and if this does not resolve in a few days to please contact us. Pt verbalized understanding with all questions answered. Marland Kitchen

## 2021-07-18 NOTE — Telephone Encounter (Signed)
Prior auth required for PROLIA  PA PROCESS DETAILS: Please complete the prior authorization form located at [email or web address] [email or web address] or call [phone]  

## 2021-07-19 NOTE — Telephone Encounter (Signed)
Inbound call from patient requesting another call from a nurse please.

## 2021-07-19 NOTE — Telephone Encounter (Signed)
Pt stated that she had Blood tinged Mucus that came out of her rectum the other day. Pt called our office 2 days ago and stated the same thing. Pt does have a Colostomy. Pt is visually impaired.  Pt was in the ED on 07/10/2021: Pt states that she had a SBO although I do not see this in the chart. Pt went back on the 07/15/2021 due to the family thinking that she was having blood in her urine. Negative workup for Hematuria:  Pt daughter Roslyn Smiling was called to better understand what was going on. Malicia stated that her mom recently had 8 teeth pulled and is worked up about that. Malicia stated that pt had a dime size Bloody Tinged Mucus from her rectum on Sunday. Malicia stated that's she is not concerned and her mom is just worked up.  Pt was called backed and confirmed with details Malicia had shared and pt agreed. Pt notified to call us back if she felt that she was getting any worse of had any concerns.  Pt verbalized understanding with all questions answered.

## 2021-07-19 NOTE — Telephone Encounter (Signed)
Left message for pt to call back  °

## 2021-07-20 NOTE — Telephone Encounter (Signed)
Prior auth approved via [facility] provider portal PA# [id number] Valid 07/20/21-07/20/22

## 2021-07-20 NOTE — Telephone Encounter (Signed)
Pt ready for scheduling on or after 07/20/21  Out-of-pocket cost due at time of visit: $276  Primary: [facility] Medicare Prolia co-insurance: 20% (approximately $276) Admin fee co-insurance: 20% (approximately $25)  Secondary: n/a Prolia co-insurance:  Admin fee co-insurance:   Deductible: does not apply  Prior Auth: APPROVED PA# [id number] Valid: 07/20/21-07/20/22  ** This summary of benefits is an estimation of the patient's out-of-pocket cost. Exact cost may vary based on individual plan coverage.

## 2021-07-25 ENCOUNTER — Telehealth: Payer: Self-pay | Admitting: Endocrinology

## 2021-07-25 ENCOUNTER — Encounter: Payer: Self-pay | Admitting: Family Medicine

## 2021-07-25 ENCOUNTER — Ambulatory Visit (INDEPENDENT_AMBULATORY_CARE_PROVIDER_SITE_OTHER): Payer: Medicare Other | Admitting: Family Medicine

## 2021-07-25 VITALS — BP 138/80 | HR 94 | Temp 98.5°F | Wt 158.0 lb

## 2021-07-25 DIAGNOSIS — M069 Rheumatoid arthritis, unspecified: Secondary | ICD-10-CM

## 2021-07-25 DIAGNOSIS — F1190 Opioid use, unspecified, uncomplicated: Secondary | ICD-10-CM | POA: Diagnosis not present

## 2021-07-25 DIAGNOSIS — G8929 Other chronic pain: Secondary | ICD-10-CM | POA: Diagnosis not present

## 2021-07-25 MED ORDER — HYDROCODONE-ACETAMINOPHEN 10-325 MG PO TABS: 1.0000 | ORAL_TABLET | Freq: Four times a day (QID) | ORAL | 0 refills | Status: DC | PRN

## 2021-07-25 MED ORDER — HYDROCODONE-ACETAMINOPHEN 10-325 MG PO TABS
1.0000 | ORAL_TABLET | Freq: Four times a day (QID) | ORAL | 0 refills | Status: DC | PRN
Start: 2021-08-02 — End: 2021-07-25

## 2021-07-25 MED ORDER — CLOBETASOL PROPIONATE 0.05 % EX OINT: 1.0000 "application " | TOPICAL_OINTMENT | Freq: Two times a day (BID) | CUTANEOUS | 5 refills | Status: DC

## 2021-07-25 MED ORDER — CLOBETASOL PROPIONATE 0.05 % EX SOLN: 1.0000 "application " | Freq: Two times a day (BID) | CUTANEOUS | 5 refills | Status: DC

## 2021-07-25 NOTE — Telephone Encounter (Signed)
Pt is calling in stating that she is calling to see when she should be scheduling her prolia since she has had her dental work done.  Pt would like to have a call back.

## 2021-07-25 NOTE — Progress Notes (Signed)
° °  Subjective:    Patient ID: [name], female    DOB: January 14, 1956, 65 y.o.   MRN: [id number]  HPI Here with her daughter for pain management. Her RA is stable and the medication helps her quite a bit.    Review of Systems  Constitutional: Negative.   Respiratory: Negative.    Cardiovascular: Negative.   Musculoskeletal:  Positive for arthralgias and back pain.      Objective:   Physical Exam Constitutional:      General: She is not in acute distress. Cardiovascular:     Rate and Rhythm: Normal rate and regular rhythm.     Pulses: Normal pulses.     Heart sounds: Normal heart sounds.  Pulmonary:     Effort: Pulmonary effort is normal.     Breath sounds: Normal breath sounds.  Musculoskeletal:     Comments: Both hands are tender with reduced grip strength   Neurological:     Mental Status: She is alert.          Assessment & Plan:  Pain management. Indication for chronic opioid: RA Medication and dose: Norco 10-325 # pills per month: 120 Last UDS date: 07-25-21 Opioid Treatment Agreement signed (Y/N): 07-12-17 Opioid Treatment Agreement last reviewed with patient:  07-25-21 [facility] reviewed this encounter (include red flags): Yes Meds were refilled.  [name], MD

## 2021-07-27 LAB — DM TEMPLATE

## 2021-07-27 LAB — DRUG MONITOR, PANEL 1, W/CONF, URINE
Amphetamines: NEGATIVE ng/mL (ref <500)
Barbiturates: NEGATIVE ng/mL (ref <300)
Benzodiazepines: NEGATIVE ng/mL (ref <100)
Cocaine Metabolite: NEGATIVE ng/mL (ref <150)
Codeine: NEGATIVE ng/mL (ref <50)
Creatinine: 40.3 mg/dL (ref >=20.0)
Hydrocodone: 439 ng/mL — ABNORMAL HIGH (ref <50)
Hydromorphone: NEGATIVE ng/mL (ref <50)
Marijuana Metabolite: NEGATIVE ng/mL (ref <20)
Methadone Metabolite: NEGATIVE ng/mL (ref <100)
Morphine: NEGATIVE ng/mL (ref <50)
Norhydrocodone: 674 ng/mL — ABNORMAL HIGH (ref <50)
Opiates: POSITIVE ng/mL — AB (ref <100)
Oxidant: NEGATIVE ug/mL (ref <200)
Oxycodone: NEGATIVE ng/mL (ref <100)
Phencyclidine: NEGATIVE ng/mL (ref <25)
pH: 6.9 (ref 4.5–9.0)

## 2021-07-27 NOTE — Telephone Encounter (Signed)
LVM for pt to let her know that she can cb to the off to get scheduled for her injection on or after 07/20/2021. Out-of-pocket is $276 with an admin fee [phone]

## 2021-07-31 ENCOUNTER — Telehealth: Payer: Self-pay

## 2021-07-31 ENCOUNTER — Telehealth: Payer: Self-pay | Admitting: Internal Medicine

## 2021-07-31 NOTE — Telephone Encounter (Signed)
Received a call from [employer] regarding pt but when I call [employer] back they stated that they do not have any records that someone called.

## 2021-07-31 NOTE — Telephone Encounter (Signed)
Inbound call from patient requesting to speak with a nurse in regards to some stomach pain she is having.

## 2021-08-01 ENCOUNTER — Other Ambulatory Visit: Payer: Self-pay

## 2021-08-01 NOTE — Telephone Encounter (Signed)
Left message for pt to call back  °

## 2021-08-02 ENCOUNTER — Other Ambulatory Visit: Payer: Self-pay

## 2021-08-02 ENCOUNTER — Telehealth: Payer: Self-pay | Admitting: Family Medicine

## 2021-08-02 DIAGNOSIS — Z433 Encounter for attention to colostomy: Secondary | ICD-10-CM

## 2021-08-02 NOTE — Telephone Encounter (Signed)
Spoke with patient and reverified insurance.  I've placed a call to Dr. Loanne Drilling office to inquire if any other provider in office accepts patient insurance.   Patient said she is willing to see another MD at office is so.

## 2021-08-02 NOTE — Telephone Encounter (Signed)
Left message for pt to call back  °

## 2021-08-02 NOTE — Telephone Encounter (Signed)
Lvm for patient to call office for message Called Endocrinology all doctors there accept patient insurance.   Patient is to receive a Prolia injection which will call her $276 OOP, and $25 Admin fee.   Patient doesn't want to pay this amount.   Front desk rep stated that patient has been informed of patient assistant programs as options for assistance.

## 2021-08-02 NOTE — Telephone Encounter (Signed)
[name] called in stating that she needs to be referred to another endocrinology because Dr.Sean Loanne Drilling no longer accept her insurance.  [name] could be contacted at [phone].  Please advise.

## 2021-08-02 NOTE — Telephone Encounter (Signed)
Called patient to [employer], no answer.   Requesting new referral.

## 2021-08-02 NOTE — Telephone Encounter (Signed)
Pt is blind and would like [place] to find her endocrinologist. Pt said dr Loanne Drilling no longer accept her insurance. Pt has [facility] and [facility]

## 2021-08-02 NOTE — Telephone Encounter (Signed)
Once she can find an endocrinologist that can see her, I will do the referral

## 2021-08-03 NOTE — Telephone Encounter (Signed)
Patient returned call from yesterday. I let her know that Izora Gala was unavailable and would give her a callback later when she was available. Patient verbalized understanding       Please advise

## 2021-08-04 NOTE — Telephone Encounter (Signed)
Send message to Referral coordination Tyson Dense who stated that reerral had not been received, stated that she faxed the referral to [facility] per patient request

## 2021-08-04 NOTE — Telephone Encounter (Signed)
Spoke with pt advised to contact her insurance for information on which endocrinologist is covered by her insurance.

## 2021-08-04 NOTE — Telephone Encounter (Signed)
Pt is returning nancy call 

## 2021-08-04 NOTE — Telephone Encounter (Signed)
Pt stated that the pain has subsided and that she is feeling better now. Pt notified to call us back if she needs [place].  Pt verbalized understanding with all questions answered.

## 2021-08-07 ENCOUNTER — Ambulatory Visit: Payer: Medicare Other | Admitting: Interventional Cardiology

## 2021-08-11 ENCOUNTER — Telehealth: Payer: Self-pay | Admitting: Family Medicine

## 2021-08-11 DIAGNOSIS — Z933 Colostomy status: Secondary | ICD-10-CM | POA: Diagnosis not present

## 2021-08-11 NOTE — Telephone Encounter (Signed)
Joy social work for [employer] is calling concerning pt home health orders  and to discuss her need for new endocrinologist

## 2021-08-16 NOTE — Telephone Encounter (Signed)
[name] is returning nancy call

## 2021-08-16 NOTE — Telephone Encounter (Signed)
Spoke with Joy pt Education officer, [employer] for [employer], advised that referral for [place] was placed with [name] also made her aware that pt was advised to contact her insurance to find out which Endocrinologist is in network with her insurance, Dr Sarajane Jews is willing to provide a referral is needed. Verbalized understanding

## 2021-08-18 ENCOUNTER — Ambulatory Visit (INDEPENDENT_AMBULATORY_CARE_PROVIDER_SITE_OTHER): Payer: Medicare Other

## 2021-08-18 DIAGNOSIS — H5440 Blindness, one eye, unspecified eye: Secondary | ICD-10-CM

## 2021-08-18 DIAGNOSIS — I10 Essential (primary) hypertension: Secondary | ICD-10-CM

## 2021-08-18 DIAGNOSIS — M069 Rheumatoid arthritis, unspecified: Secondary | ICD-10-CM

## 2021-08-18 DIAGNOSIS — E785 Hyperlipidemia, unspecified: Secondary | ICD-10-CM

## 2021-08-18 DIAGNOSIS — M810 Age-related osteoporosis without current pathological fracture: Secondary | ICD-10-CM

## 2021-08-18 NOTE — Patient Instructions (Signed)
Visit Information   Thank you for taking time to visit with me today. Please don't hesitate to contact me if I can be of assistance to you before our next scheduled telephone appointment.  Following are the goals we discussed today:  Take all medications as prescribed Attend all scheduled provider appointments Call pharmacy for medication refills 3-7 days in advance of running out of medications Call provider office for new concerns or questions  check blood pressure weekly keep a blood pressure log take blood pressure log to all doctor appointments call doctor for signs and symptoms of high blood pressure keep all doctor appointments eat more whole grains, fruits and vegetables, lean meats and healthy fats limit salt intake to 2340m/day call for medicine refill 2 or 3 days before it runs out take all medications exactly as prescribed call doctor with any symptoms you believe are related to your medicine  Our next appointment is by telephone on 09/14/21 at 2:15 PM  Please call the care guide team at [phone] you need to cancel or reschedule your appointment.   If you are experiencing a Mental Health or [place] need someone to talk to, please call the Suicide and Crisis Lifeline: 988 call the [employer] Suicide Prevention Lifeline: 1831-007-3830or TTY: 18300135513TTY [phone] 534-0267 to talk to a trained counselor call 1-800-273-TALK (toll free, 24 hour hotline) go to [facility] [address] [phone] call 911   Following is a copy of your full care plan:  Care Plan : [place]  Updates made by SDimitri Ped RN since 08/18/2021 12:00 AM     Problem: Chronic Disease Management and Care Coordination Needs (HTN, HLD, RA, Osteoporosis)   Priority: High     Long-Range Goal: Establish Plan of Care for Chronic Disease Management Needs ((HTN, HLD, RA, Osteoporosis)   Start  Date: 08/18/2021  Expected End Date: 08/18/2022  Priority: High  Note:   Current Barriers:  Knowledge Deficits related to plan of care for management of HTN, HLD, Osteoporosis, and RA, chronic pain  Care Coordination needs related to ADL IADL limitations, Inability to perform ADL's independently, Inability to perform IADL's independently, and blindness Chronic Disease Management support and education needs related to HTN, HLD, Osteoporosis, and RA, chronic pain   States that her pain is good today and she is taking Tramadol twice a day as ordered.  States she did have some stomach pain but that improved after her bowels moved.  States she has an aide 4 hours a day.  States she does not eat enough fruits and vegetables but she does drink an Ensure every day. States her daughter fills her pill box weekly, changes her colostomy bag and  gives her injections.  States her daughter may need surgery for breast cancer and she will need help with these things.  RNCM Clinical Goal(s):  Patient will verbalize understanding of plan for management of HTN, HLD, Osteoporosis, and RA, chronic pain  as evidenced by voiced adherence to plan of care verbalize basic understanding of  HTN, HLD, Osteoporosis, and RA, chronic pain  disease process and self health management plan as evidenced by voiced understanding and teach back take all medications exactly as prescribed and will call provider for medication related questions as evidenced by dispense report and pt verbalization  attend all scheduled medical appointments: Cardiology 09/13/21,CCM PharmD 10/30/21 as evidenced by medical records demonstrate Improved adherence to prescribed treatment plan for  HTN, HLD, Osteoporosis, and RA, chronic pain  as evidenced by readings within limits, voiced adherence to plan of care continue to work with RN Care Manager to address care management and care coordination needs related to  HTN, HLD, Osteoporosis, and RA, chronic pain  as  evidenced by adherence to CM Team Scheduled appointments work with pharmacist to address ADL IADL limitations, Inability to perform ADL's independently, Inability to perform IADL's independently, and blindness related toHTN, HLD, Osteoporosis, and RA, chronic pain  as evidenced by review or EMR and patient or pharmacist report through collaboration with RN Care manager, provider, and care team.   Interventions: 1:1 collaboration with primary care provider regarding development and update of comprehensive plan of care as evidenced by provider attestation and co-signature Inter-disciplinary care team collaboration (see longitudinal plan of care) Evaluation of current treatment plan related to  self management and patient's adherence to plan as established by provider   Rheumatoid Arthritis   (Status:  New goal.)  Long Term Goal Evaluation of current treatment plan related to  Rheumatoid Arthritis  , ADL IADL limitations, Inability to perform ADL's independently, and Inability to perform IADL's independently self-management and patient's adherence to plan as established by provider. Discussed plans with patient for ongoing care management follow up and provided patient with direct contact information for care management team Evaluation of current treatment plan related to Rheumatoid Arthritis  and patient's adherence to plan as established by provider Provided education to patient re: Rheumatoid Arthritis  Pharmacy referral for working with PharmD Pt is awaiting to be contacted by home health for assistance when daughter has surgery  Hyperlipidemia Interventions:  (Status:  New goal.) Long Term Goal Medication review performed; medication list updated in electronic medical record.  Provider established cholesterol goals reviewed Counseled on importance of regular laboratory monitoring as prescribed Reviewed role and benefits of statin for ASCVD risk reduction  Hypertension Interventions:   (Status:  New goal.) Long Term Goal Last practice recorded BP readings:  BP Readings from Last 3 Encounters:  07/25/21 138/80  07/15/21 122/90  07/10/21 132/85  Most recent eGFR/CrCl: No results found for: EGFR  No components found for: CRCL  Evaluation of current treatment plan related to hypertension self management and patient's adherence to plan as established by provider Provided education to patient re: stroke prevention, s/s of heart attack and stroke Reviewed medications with patient and discussed importance of compliance Advised patient, providing education and rationale, to monitor blood pressure daily and record, calling PCP for findings outside established parameters Provided education on prescribed diet low sodium  Pain Interventions:  (Status:  New goal.) Long Term Goal Pain assessment performed Medications reviewed Reviewed provider established plan for pain management Discussed importance of adherence to all scheduled medical appointments Counseled on the importance of reporting any/all new or changed pain symptoms or management strategies to pain management provider Advised patient to report to care team affect of pain on daily activities Reviewed with patient prescribed pharmacological and nonpharmacological pain relief strategies Screening for signs and symptoms of depression related to chronic disease state  Assessed social determinant of health barriers  Patient Goals/Self-Care Activities: Take all medications as prescribed Attend all scheduled provider appointments Call pharmacy for medication refills 3-7 days in advance of running out of medications Call provider office for new concerns or questions  check blood pressure weekly keep a blood pressure log take blood pressure log to all doctor appointments call doctor for signs and symptoms of high blood pressure keep all  doctor appointments eat more whole grains, fruits and vegetables, lean meats and healthy  fats limit salt intake to 2354m/day call for medicine refill 2 or 3 days before it runs out take all medications exactly as prescribed call doctor with any symptoms you believe are related to your medicine  Follow Up Plan:  Telephone follow up appointment with care management team member scheduled for:  09/14/21 The patient has been provided with contact information for the care management team and has been advised to call with any health related questions or concerns.       Consent to CCM Services: Ms. GShappellwas given information about Chronic Care Management services including:  CCM service includes personalized support from designated clinical staff supervised by her physician, including individualized plan of care and coordination with other care providers 24/7 contact phone numbers for assistance for urgent and routine care needs. Service will only be billed when office clinical staff spend 20 minutes or more in a month to coordinate care. Only one practitioner may furnish and bill the service in a calendar month. The patient may stop CCM services at any time (effective at the end of the month) by phone call to the office staff. The patient will be responsible for cost sharing (co-pay) of up to 20% of the service fee (after annual deductible is met).  Patient agreed to services and verbal consent obtained.   The patient verbalized understanding of instructions, educational materials, and care plan provided today and agreed to receive a mailed copy of patient instructions, educational materials, and care plan.   Telephone follow up appointment with care management team member scheduled for:

## 2021-08-18 NOTE — Chronic Care Management (AMB) (Signed)
Chronic Care Management   CCM RN Visit Note  08/18/2021 Name: [name] MRN: [id number] DOB: 1955/10/06  Subjective: [name] is a 66 y.o. year old female who is a primary care patient of [name], MD. The care management team was consulted for assistance with disease management and care coordination needs.    Engaged with patient by telephone for initial visit in response to provider referral for case management and/or care coordination services.   Consent to Services:  The patient was given the following information about Chronic Care Management services today, agreed to services, and gave verbal consent: 1. CCM service includes personalized support from designated clinical staff supervised by the primary care provider, including individualized plan of care and coordination with other care providers 2. 24/7 contact phone numbers for assistance for urgent and routine care needs. 3. Service will only be billed when office clinical staff spend 20 minutes or more in a month to coordinate care. 4. Only one practitioner may furnish and bill the service in a calendar month. 5.The patient may stop CCM services at any time (effective at the end of the month) by phone call to the office staff. 6. The patient will be responsible for cost sharing (co-pay) of up to 20% of the service fee (after annual deductible is met). Patient agreed to services and consent obtained.  Patient agreed to services and verbal consent obtained.   Assessment: Review of patient past medical history, allergies, medications, health status, including review of consultants reports, laboratory and other test data, was performed as part of comprehensive evaluation and provision of chronic care management services.   SDOH (Social Determinants of Health) assessments and interventions performed:  SDOH Interventions    Flowsheet Row Most Recent Value  SDOH Interventions   Food Insecurity Interventions Intervention  Not Indicated  Financial Strain Interventions Intervention Not Indicated  Housing Interventions Intervention Not Indicated  Stress Interventions Intervention Not Indicated  Transportation Interventions Intervention Not Indicated        CCM Care Plan  Allergies  Allergen Reactions   Spinach Itching, Rash and Other (See Comments)    [place].    Outpatient Encounter Medications as of 08/18/2021  Medication Sig   Abatacept 125 MG/ML SOSY Inject 125 mg into the skin every Monday. tuesday   albuterol (VENTOLIN HFA) 108 (90 Base) MCG/ACT inhaler INHALE 1 PUFF INTO THE LUNGS EVERY 4 HOURS AS NEEDED FOR WHEEZING OR SHORTNESS OF BREATH   allopurinol (ZYLOPRIM) 100 MG tablet TAKE 1 TABLET BY MOUTH EVERY DAY   amLODipine (NORVASC) 10 MG tablet TAKE 1 TABLET BY MOUTH EVERY DAY   aspirin EC 81 MG tablet Take 1 tablet (81 mg total) by mouth daily.   CALCIUM PO Take 1 tablet by mouth 2 (two) times daily.   cetirizine (ZYRTEC) 10 MG tablet Take 1 tablet (10 mg total) by mouth daily.   Cholecalciferol (VITAMIN D) 2000 units CAPS Take 2,000 Units by mouth daily.   clobetasol (TEMOVATE) 0.05 % external solution Apply 1 application topically 2 (two) times daily.   clobetasol ointment (TEMOVATE) 7.20 % Apply 1 application topically 2 (two) times daily.   denosumab (PROLIA) 60 MG/ML SOSY injection Inject 60 mg into the skin every 6 (six) months.   diazepam (VALIUM) 2 MG tablet Take 1 tablet (2 mg total) by mouth every 12 (twelve) hours as needed for anxiety.   diclofenac sodium (VOLTAREN) 1 % GEL Apply 1 application topically 4 (four) times daily as needed (arthritis).  docusate sodium (COLACE) 100 MG capsule Take 100 mg by mouth 2 (two) times daily.   esomeprazole (NEXIUM) 40 MG capsule TAKE 1 CAPSULE(40 MG) BY MOUTH DAILY IN THE AFTERNOON   folic acid (FOLVITE) 599 MCG tablet Take 400 mcg by mouth daily.   [START ON 09/30/2021] HYDROcodone-acetaminophen (NORCO) 10-325 MG tablet Take 1 tablet by mouth every  6 (six) hours as needed for moderate pain.   ketoconazole (NIZORAL) 2 % shampoo Apply 1 application topically 2 (two) times a week.   megestrol (MEGACE ES) 625 MG/5ML suspension take 5 milliliters by mouth three times daily before meals as needed for appetite (Patient taking differently: Take 625 mg by mouth daily as needed (appetite).)   Methotrexate Sodium (METHOTREXATE, PF,) 50 MG/2ML injection Inject 0.5 mLs into the muscle once a week. Tues   Multiple Vitamin (MULTIVITAMIN WITH MINERALS) TABS tablet Take 1 tablet by mouth daily.   PERIDEX 0.12 % solution Use as directed 15 mLs in the mouth or throat 2 (two) times daily.    polyethylene glycol (MIRALAX / GLYCOLAX) 17 g packet Take 17 g by mouth daily as needed for mild constipation.   tiZANidine (ZANAFLEX) 4 MG tablet TAKE 1 TABLET(4 MG) BY MOUTH TWICE DAILY   traMADol (ULTRAM) 50 MG tablet Take 1-2 tablets (50-100 mg total) by mouth every 6 (six) hours as needed.   vitamin B-12 (CYANOCOBALAMIN) 500 MCG tablet Take 500 mcg by mouth daily.   vitamin C (ASCORBIC ACID) 500 MG tablet Take 500 mg by mouth daily.    No facility-administered encounter medications on file as of 08/18/2021.    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   Chronic diastolic CHF (congestive heart failure) ([place])    Ventral hernia without obstruction or gangrene 06/06/2015   Renal mass    Pulmonary HTN ([place]) 01/01/2014   AKI (acute kidney injury) ([place]) 01/01/2014   Iron deficiency anemia 01/01/2014   Atrial flutter ([place]) 12/31/2013   Encounter for ostomy care education 11/30/2013   Stoma dermatitis 11/30/2013   Incisional hernia 11/30/2013    Constipation, chronic 11/30/2013   Charcot's joint of foot 07/07/2013   Bilateral leg edema 04/20/2013   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 11/28/2012 01/05/2013   Rheumatoid arthritis ([place]) 06/07/2009   THYROID NODULE 07/01/2007   LUNG NODULE 07/01/2007   Osteoporosis 05/07/2007   ABUSE, ALCOHOL, 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/12/2007   Blindness 04/12/2007   HEMORRHOIDS, INTERNAL 04/12/2007   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/14/2007   Hyperlipidemia 01/07/2007   Essential hypertension 01/07/2007   GERD 01/07/2007   Marfan's syndrome 01/07/2007   Stroke (Home) 07/2006   Chronic gout 2008    Conditions to be addressed/monitored:HTN, HLD, Osteoporosis, and RA  Care Plan : RN Care Manager Plan of Care  Updates made by Dimitri Ped, RN since 08/18/2021 12:00 AM     Problem: Chronic Disease Management and Care Coordination Needs (HTN, HLD, RA, Osteoporosis)   Priority: High     Long-Range Goal: Establish Plan of Care for Chronic Disease Management Needs ((HTN, HLD, RA, Osteoporosis)   Start Date: 08/18/2021  Expected End Date: 08/18/2022  Priority: High  Note:   Current Barriers:  Knowledge Deficits related to  plan of care for management of HTN, HLD, Osteoporosis, and RA, chronic pain  Care Coordination needs related to ADL IADL limitations, Inability to perform ADL's independently, Inability to perform IADL's independently, and blindness Chronic Disease Management support and education needs related to HTN, HLD, Osteoporosis, and RA, chronic pain   States that her pain is good today and she is taking Tramadol twice a day as ordered.  States she did have some stomach pain but that improved after her bowels moved.  States she has an aide 4 hours a day.  States she does not eat enough fruits and vegetables but she does drink an Ensure every day. States her daughter fills her pill box weekly, changes her colostomy bag and  gives her injections.  States her daughter may need surgery  for breast cancer and she will need help with these things.  RNCM Clinical Goal(s):  Patient will verbalize understanding of plan for management of HTN, HLD, Osteoporosis, and RA, chronic pain  as evidenced by voiced adherence to plan of care verbalize basic understanding of  HTN, HLD, Osteoporosis, and RA, chronic pain  disease process and self health management plan as evidenced by voiced understanding and teach back take all medications exactly as prescribed and will call provider for medication related questions as evidenced by dispense report and pt verbalization  attend all scheduled medical appointments: Cardiology 09/13/21,CCM PharmD 10/30/21 as evidenced by medical records demonstrate Improved adherence to prescribed treatment plan for HTN, HLD, Osteoporosis, and RA, chronic pain  as evidenced by readings within limits, voiced adherence to plan of care continue to work with RN Care Manager to address care management and care coordination needs related to  HTN, HLD, Osteoporosis, and RA, chronic pain  as evidenced by adherence to CM Team Scheduled appointments work with pharmacist to address ADL IADL limitations, Inability to perform ADL's independently, Inability to perform IADL's independently, and blindness related toHTN, HLD, Osteoporosis, and RA, chronic pain  as evidenced by review or EMR and patient or pharmacist report through collaboration with RN Care manager, provider, and care team.   Interventions: 1:1 collaboration with primary care provider regarding development and update of comprehensive plan of care as evidenced by provider attestation and co-signature Inter-disciplinary care team collaboration (see longitudinal plan of care) Evaluation of current treatment plan related to  self management and patient's adherence to plan as established by provider   Rheumatoid Arthritis   (Status:  New goal.)  Long Term Goal Evaluation of current treatment plan related to  Rheumatoid  Arthritis  , ADL IADL limitations, Inability to perform ADL's independently, and Inability to perform IADL's independently self-management and patient's adherence to plan as established by provider. Discussed plans with patient for ongoing care management follow up and provided patient with direct contact information for care management team Evaluation of current treatment plan related to Rheumatoid Arthritis  and patient's adherence to plan as established by provider Provided education to patient re: Rheumatoid Arthritis  Pharmacy referral for working with PharmD Pt is awaiting to be contacted by home health for assistance when daughter has surgery  Hyperlipidemia Interventions:  (Status:  New goal.) Long Term Goal Medication review performed; medication list updated in electronic medical record.  Provider established cholesterol goals reviewed Counseled on importance of regular laboratory monitoring as prescribed Reviewed role and benefits of statin for ASCVD risk reduction  Hypertension Interventions:  (Status:  New goal.) Long Term Goal Last practice recorded BP readings:  BP Readings from Last 3 Encounters:  07/25/21 138/80  07/15/21 122/90  07/10/21 132/85  Most recent eGFR/CrCl: No results found for: EGFR  No components found for: CRCL  Evaluation of current treatment plan related to hypertension self management and patient's adherence to plan as established by provider Provided education to patient re: stroke prevention, s/s of heart attack and stroke Reviewed medications with patient and discussed importance of compliance Advised patient, providing education and rationale, to monitor blood pressure daily and record, calling PCP for findings outside established parameters Provided education on prescribed diet low sodium  Pain Interventions:  (Status:  New goal.) Long Term Goal Pain assessment performed Medications reviewed Reviewed provider established plan for pain  management Discussed importance of adherence to all scheduled medical appointments Counseled on the importance of reporting any/all new or changed pain symptoms or management strategies to pain management provider Advised patient to report to care team affect of pain on daily activities Reviewed with patient prescribed pharmacological and nonpharmacological pain relief strategies Screening for signs and symptoms of depression related to chronic disease state  Assessed social determinant of health barriers  Patient Goals/Self-Care Activities: Take all medications as prescribed Attend all scheduled provider appointments Call pharmacy for medication refills 3-7 days in advance of running out of medications Call provider office for new concerns or questions  check blood pressure weekly keep a blood pressure log take blood pressure log to all doctor appointments call doctor for signs and symptoms of high blood pressure keep all doctor appointments eat more whole grains, fruits and vegetables, lean meats and healthy fats limit salt intake to 2355m/day call for medicine refill 2 or 3 days before it runs out take all medications exactly as prescribed call doctor with any symptoms you believe are related to your medicine  Follow Up Plan:  Telephone follow up appointment with care management team member scheduled for:  09/14/21 The patient has been provided with contact information for the care management team and has been advised to call with any health related questions or concerns.       Plan:Telephone follow up appointment with care management team member scheduled for:  09/14/21 The patient has been provided with contact information for the care management team and has been advised to call with any health related questions or concerns.  MPeter GarterRN, BJackquline Denmark CDE Care Management Coordinator [facility] ([phone]

## 2021-08-22 DIAGNOSIS — I10 Essential (primary) hypertension: Secondary | ICD-10-CM

## 2021-08-22 DIAGNOSIS — E785 Hyperlipidemia, unspecified: Secondary | ICD-10-CM | POA: Diagnosis not present

## 2021-08-22 DIAGNOSIS — M069 Rheumatoid arthritis, unspecified: Secondary | ICD-10-CM | POA: Diagnosis not present

## 2021-08-22 DIAGNOSIS — M810 Age-related osteoporosis without current pathological fracture: Secondary | ICD-10-CM | POA: Diagnosis not present

## 2021-08-24 ENCOUNTER — Telehealth: Payer: Self-pay | Admitting: Family Medicine

## 2021-08-24 NOTE — Telephone Encounter (Signed)
Bethann Punches called from [employer] because she has reached out to [facility] and they have received no orders for patient. Miss Felipa Evener is asking if orders can be re-written and sent to [facility] or any home health. ? ? ? ?Good callback number is [phone] ? ?Please advise  ?

## 2021-08-24 NOTE — Telephone Encounter (Signed)
Please advise 

## 2021-08-25 ENCOUNTER — Telehealth: Payer: Self-pay

## 2021-08-25 DIAGNOSIS — M810 Age-related osteoporosis without current pathological fracture: Secondary | ICD-10-CM

## 2021-08-25 NOTE — Telephone Encounter (Signed)
Pt informed that referral has been placed 

## 2021-08-25 NOTE — Telephone Encounter (Signed)
Please take care of these orders (I believe for colostomy care) ?

## 2021-08-25 NOTE — Telephone Encounter (Signed)
I did the referral 

## 2021-08-29 ENCOUNTER — Telehealth: Payer: Self-pay

## 2021-08-29 NOTE — Telephone Encounter (Signed)
Spoke with pt this afternoon, stated that she has not heard from the [facility] yet, advised that most of the [place] agencies are not accepting pt insurance, Reached out to two of the office referral coordinators who have reached out to the  [place], [name] blood reached out and faxed out pt information to [facility] who stated that pt was with the agency after her hospitalization 2022, advised pt that I will call [name] in the morning to find out if the take pt insurance. ?

## 2021-08-29 NOTE — Telephone Encounter (Signed)
Spoke with pt this afternoon, stated that she has not heard from the [facility] yet, advised that most of the [place] agencies are not accepting pt insurance, Reached out to two of the office referral coordinators who have reached out to the  [facility], Ms Annamaria Boots blood reached out and faxed out pt information to [facility] who stated that pt was with the agency after her hospitalization 2022, advised pt that I will call [name] in the morning to find out if the take pt insurance. ?

## 2021-08-30 ENCOUNTER — Other Ambulatory Visit: Payer: Self-pay | Admitting: Family Medicine

## 2021-09-01 NOTE — Telephone Encounter (Signed)
Spoke with agent from [facility], advised to fax pt last face to face office notes, referral to them for review and someone should call the office / pt back if referral is approved, Paperwork faxed and confirmation received ?

## 2021-09-08 DIAGNOSIS — Z933 Colostomy status: Secondary | ICD-10-CM | POA: Diagnosis not present

## 2021-09-11 DIAGNOSIS — M0549 Rheumatoid myopathy with rheumatoid arthritis of multiple sites: Secondary | ICD-10-CM | POA: Diagnosis not present

## 2021-09-12 NOTE — Progress Notes (Signed)
?Cardiology Office Note:   ? ?Date:  09/13/2021  ? ?ID:  [name], DOB [birth year], MRN [id number] ? ?PCP:  [name], MD ?  ?[facility] Providers ?Cardiologist:  Sinclair Grooms, MD    ? ?Referring MD: [name], MD  ? ?Chief Complaint: overdue follow-up chronic HFpEF, aortic dilatation ? ?History of Present Illness:   ? ?[name] is a 66 y.o. female with a hx of HTN, chronic HFpEF, tachycardia induced cardiomyopathy (now recovered), legal blindness, atrial flutter s/p ablation 2015, stroke, RA, HTN, prior history of alcohol abuse,first degree AV block, and possible Marfan's syndrome with significant MSK abnormality and joint effusion.  ? ?She underwent successful catheter ablation of 2 distinct reentrant circuits by Dr. Lovena Le due to incessant atrial flutter in 2015. She returned in 10/2016 with concerns for palpitations. Cardiac monitor revealed NSR and sinus tachycardia, rare PACs.  ? ?She was last seen in our office on 04/04/20 by Dr. Tamala Julian. No medication changes were made and one year follow-up was recommended. He recommended repeating echo for evaluation of aortic root. Echo 03/2021 revealed LVEF 50-55%, aortic size normal, mild to moderate mitral valve leak.  ? ?Today, she is here with her daughter. She is in a wheelchair. Not very physically active. She denies chest pain, shortness of breath, lower extremity edema, fatigue,  melena, hematuria, hemoptysis, diaphoresis, weakness, presyncope, syncope, orthopnea, and PND. Reports occasional palpitations that are burdensome to her. She has no specific cardiac complaints.  ? ? ?Past Medical History:  ?Diagnosis Date  ? Atrial flutter ([place])   ? CHF (congestive heart failure) ([place])   ? Chronic gout 2008  ? Diverticulitis of large intestine with perforation   ? Dysrhythmia   ? Fibroid   ? Hearing aid worn   ? pt wears bilateral hearing aids  ? Hernia 4/08  ? Hx of adenomatous polyp of colon 11/13/2016  ? Hypertension   ? Legally blind   ?  since pt was a teenager  ? 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 Osteoporosis   ? 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 Rheumatoid arthritis ([place])   ? sees Dr. Gavin Pound   ? SBO (small bowel obstruction) ([place]) 01/05/2013  ? Small bowel obstruction due to adhesions [facility])   ? Status post bunionectomy 1/10  ? bilateral   ? Stroke ([place]) 2/08  ? Substance abuse ([place])   ? recovering alcoholic T59 years   ? 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 bilateral   ? ? ?Past Surgical History:  ?Procedure Laterality Date  ? ABLATION  01/04/14  ? atrial flutter ablation (2 circuits) by Dr Lovena Le  ? ATRIAL FLUTTER ABLATION N/A 01/04/2014  ? Procedure: ATRIAL FLUTTER ABLATION;  Surgeon: Evans Lance, MD;  Location: [facility] CATH LAB;  Service: Cardiovascular;  Laterality: N/A;  ? BUNIONECTOMY Bilateral 1/10  ? COLON SURGERY    ? colectomy  ? COLOSTOMY  11/27/2012  ? HERNIA REPAIR    ? IR RADIOLOGIST EVAL & MGMT  10/01/2017  ? IR RADIOLOGIST EVAL & MGMT  11/21/2017  ? IR RADIOLOGIST EVAL & MGMT  02/13/2018  ? IR RADIOLOGIST EVAL & MGMT  01/08/2019  ? IR RADIOLOGIST EVAL & MGMT  12/29/2019  ? IR RADIOLOGIST EVAL & MGMT  01/31/2021  ? LAPAROSCOPIC ABDOMINAL EXPLORATION N/A 01/06/2013  ? Procedure: LAPAROSCOPIC ABDOMINAL EXPLORATION;  Surgeon: Adin Hector, MD;  Location: [facility] ORS;  Service: General;  Laterality: N/A;  ?  LAPAROSCOPIC LYSIS OF ADHESIONS N/A 01/06/2013  ? Procedure: LAPAROSCOPIC LYSIS OF ADHESIONS/ INTEROTOMY REPAIR;  Surgeon: Adin Hector, MD;  Location: [facility] ORS;  Service: General;  Laterality: N/A;  ? LAPAROTOMY N/A 11/27/2012  ? Procedure: EXPLORATORY LAPAROTOMY SIGMOID COLECTOMY, COLOSTOMY;  Surgeon: Madilyn Hook, DO;  Location: [facility] ORS;  Service: General;  Laterality: N/A;  ? RADIOLOGY WITH ANESTHESIA Left 10/23/2017  ? Procedure: CT RENAL CRYO AND BIOPSY;  Surgeon: Aletta Edouard, MD;  Location: [facility] ORS;  Service: Radiology;  Laterality: Left;  ? TOTAL KNEE ARTHROPLASTY Bilateral 6/08  ? ? ?Current Medications: ?Current  Meds  ?Medication Sig  ? Abatacept 125 MG/ML SOSY Inject 125 mg into the skin every Monday. tuesday  ? albuterol (VENTOLIN HFA) 108 (90 Base) MCG/ACT inhaler INHALE 1 PUFF INTO THE LUNGS EVERY 4 HOURS AS NEEDED FOR WHEEZING OR SHORTNESS OF BREATH  ? allopurinol (ZYLOPRIM) 100 MG tablet TAKE 1 TABLET BY MOUTH EVERY DAY  ? amLODipine (NORVASC) 10 MG tablet TAKE 1 TABLET BY MOUTH EVERY DAY  ? aspirin EC 81 MG tablet Take 1 tablet (81 mg total) by mouth daily.  ? CALCIUM PO Take 1 tablet by mouth 2 (two) times daily.  ? cetirizine (ZYRTEC) 10 MG tablet Take 1 tablet (10 mg total) by mouth daily.  ? Cholecalciferol (VITAMIN D) 2000 units CAPS Take 2,000 Units by mouth daily.  ? clobetasol (TEMOVATE) 0.05 % external solution Apply 1 application topically 2 (two) times daily.  ? clobetasol ointment (TEMOVATE) 5.78 % Apply 1 application topically 2 (two) times daily.  ? denosumab (PROLIA) 60 MG/ML SOSY injection Inject 60 mg into the skin every 6 (six) months.  ? diazepam (VALIUM) 2 MG tablet Take 1 tablet (2 mg total) by mouth every 12 (twelve) hours as needed for anxiety.  ? diclofenac sodium (VOLTAREN) 1 % GEL Apply 1 application topically 4 (four) times daily as needed (arthritis).  ? docusate sodium (COLACE) 100 MG capsule Take 100 mg by mouth 2 (two) times daily.  ? esomeprazole (NEXIUM) 40 MG capsule TAKE 1 CAPSULE(40 MG) BY MOUTH DAILY IN THE AFTERNOON  ? folic acid (FOLVITE) 469 MCG tablet Take 400 mcg by mouth daily.  ? [START ON 09/30/2021] HYDROcodone-acetaminophen (NORCO) 10-325 MG tablet Take 1 tablet by mouth every 6 (six) hours as needed for moderate pain.  ? ketoconazole (NIZORAL) 2 % shampoo Apply 1 application topically 2 (two) times a week.  ? megestrol (MEGACE ES) 625 MG/5ML suspension take 5 milliliters by mouth three times daily before meals as needed for appetite  ? Methotrexate Sodium (METHOTREXATE, PF,) 50 MG/2ML injection Inject 0.5 mLs into the muscle once a week. Tues  ? Multiple Vitamin  (MULTIVITAMIN WITH MINERALS) TABS tablet Take 1 tablet by mouth daily.  ? PERIDEX 0.12 % solution Use as directed 15 mLs in the mouth or throat 2 (two) times daily.   ? polyethylene glycol (MIRALAX / GLYCOLAX) 17 g packet Take 17 g by mouth daily as needed for mild constipation.  ? tiZANidine (ZANAFLEX) 4 MG tablet TAKE 1 TABLET(4 MG) BY MOUTH TWICE DAILY  ? traMADol (ULTRAM) 50 MG tablet Take 1-2 tablets (50-100 mg total) by mouth every 6 (six) hours as needed.  ? vitamin B-12 (CYANOCOBALAMIN) 500 MCG tablet Take 500 mcg by mouth daily.  ? vitamin C (ASCORBIC ACID) 500 MG tablet Take 500 mg by mouth daily.   ?  ? ?Allergies:   Spinach  ? ?Social History  ? ?Socioeconomic History  ? Marital status:  Single  ?  Spouse name: Not on file  ? Number of children: 1  ? Years of education: Not on file  ? Highest education level: Not on file  ?Occupational History  ? Not on file  ?Tobacco Use  ? Smoking status: Former  ?  Packs/day: 1.00  ?  Types: Cigarettes  ?  Quit date: 11/27/2012  ?  Years since quitting: 8.8  ? Smokeless tobacco: Never  ?Vaping Use  ? Vaping Use: Former  ?Substance and Sexual Activity  ? Alcohol use: No  ?  Alcohol/week: 0.0 standard drinks  ?  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 Drug use: No  ? Sexual activity: Not Currently  ?  Partners: Male  ?  Birth control/protection: None  ?Other Topics Concern  ? Not on file  ?Social History Narrative  ? Not on file  ? ?Social Determinants of Health  ? ?Financial Resource Strain: Low Risk   ? Difficulty of Paying Living Expenses: Not hard at all  ?Food Insecurity: No Food Insecurity  ? Worried About Charity fundraiser in the Last Year: Never true  ? Ran Out of Food in the Last Year: Never true  ?Transportation Needs: No Transportation Needs  ? Lack of Transportation (Medical): No  ? Lack of Transportation (Non-Medical): No  ?Physical Activity: Inactive  ? Days of Exercise per Week: 0 days  ? Minutes of Exercise per Session: 0 min  ?Stress: No Stress  Concern Present  ? Feeling of Stress : Not at all  ?Social Connections: Unknown  ? Frequency of Communication with Friends and Family: Not on file  ? Frequency of Social Gatherings with Friends and Family: Once

## 2021-09-13 ENCOUNTER — Encounter: Payer: Self-pay | Admitting: Nurse Practitioner

## 2021-09-13 ENCOUNTER — Ambulatory Visit (INDEPENDENT_AMBULATORY_CARE_PROVIDER_SITE_OTHER): Payer: Medicare Other | Admitting: Nurse Practitioner

## 2021-09-13 ENCOUNTER — Ambulatory Visit (INDEPENDENT_AMBULATORY_CARE_PROVIDER_SITE_OTHER): Payer: Medicare Other

## 2021-09-13 ENCOUNTER — Other Ambulatory Visit: Payer: Self-pay

## 2021-09-13 VITALS — BP 118/60 | HR 90 | Ht 68.0 in

## 2021-09-13 DIAGNOSIS — I491 Atrial premature depolarization: Secondary | ICD-10-CM

## 2021-09-13 DIAGNOSIS — Q8740 Marfan's syndrome, unspecified: Secondary | ICD-10-CM

## 2021-09-13 DIAGNOSIS — I10 Essential (primary) hypertension: Secondary | ICD-10-CM

## 2021-09-13 DIAGNOSIS — R002 Palpitations: Secondary | ICD-10-CM | POA: Diagnosis not present

## 2021-09-13 DIAGNOSIS — I77819 Aortic ectasia, unspecified site: Secondary | ICD-10-CM | POA: Diagnosis not present

## 2021-09-13 LAB — TSH: TSH: 0.901 u[IU]/mL (ref 0.450–4.500)

## 2021-09-13 LAB — MAGNESIUM: Magnesium: 1.8 mg/dL (ref 1.6–2.3)

## 2021-09-13 NOTE — Progress Notes (Unsigned)
Enrolled patient for a 14 day Zio XT monitor to be mailed to patients home  Dr. Smith to read 

## 2021-09-13 NOTE — Patient Instructions (Signed)
Medication Instructions:  ?Your physician recommends that you continue on your current medications as directed. Please refer to the Current Medication list given to you today. ? ?*If you need a refill on your cardiac medications before your next appointment, please call your pharmacy* ? ? ?Lab Work: ?TODAY - magnesium, TSH ?If you have labs (blood work) drawn today and your tests are completely normal, you will receive your results only by: ?MyChart Message (if you have MyChart) OR ?A paper copy in the mail ?If you have any lab test that is abnormal or we need to change your treatment, we will call you to review the results. ? ? ?Testing/Procedures: ?14 day Zio monitor ? ? ?Follow-Up: ?At [facility], you and your health needs are our priority.  As part of our continuing mission to provide you with exceptional heart care, we have created designated Provider Care Teams.  These Care Teams include your primary Cardiologist (physician) and Advanced Practice Providers (APPs -  Physician Assistants and Nurse Practitioners) who all work together to provide you with the care you need, when you need it. ? ?We recommend signing up for the patient portal called "MyChart".  Sign up information is provided on this After Visit Summary.  MyChart is used to connect with patients for Virtual Visits (Telemedicine).  Patients are able to view lab/test results, encounter notes, upcoming appointments, etc.  Non-urgent messages can be sent to your provider as well.   ?To learn more about what you can do with MyChart, go to [email or web address].   ? ?Your next appointment:   ?6 month(s) ? ?The format for your next appointment:   ?In Person ? ?Provider:   ?Sinclair Grooms, MD   ? ? ?Other Instructions ?ZIO XT- Long Term Monitor Instructions ? ?Your physician has requested you wear a ZIO patch monitor for 14 days.  ?This is a single patch monitor. [employer] supplies one patch monitor per enrollment. Additional ?stickers are not  available. Please do not apply patch if you will be having a Nuclear Stress Test,  ?Echocardiogram, Cardiac CT, MRI, or Chest Xray during the period you would be wearing the  ?monitor. The patch cannot be worn during these tests. You cannot remove and re-apply the  ?ZIO XT patch monitor.  ?Your ZIO patch monitor will be mailed 3 day USPS to your address on file. It may take 3-5 days  ?to receive your monitor after you have been enrolled.  ?Once you have received your monitor, please review the enclosed instructions. Your monitor  ?has already been registered assigning a specific monitor serial # to you. ? ?Billing and Patient Assistance Program Information ? ?We have supplied [employer] with any of your insurance information on file for billing purposes. ?[employer] offers a sliding scale Patient Assistance Program for patients that do not have  ?insurance, or whose insurance does not completely cover the cost of the ZIO monitor.  ?You must apply for the Patient Assistance Program to qualify for this discounted rate.  ?To apply, please call [employer] at [phone], select option 4, select option 2, ask to apply for  ?Patient Assistance Program. Theodore Demark will ask your household income, and how many people  ?are in your household. They will quote your out-of-pocket cost based on that information.  ?[employer] will also be able to set up a 47-month interest-free payment plan if needed. ? ?Applying the monitor ?  ?Shave hair from upper left chest.  ?Hold abrader disc by orange tab. Rub abrader in 40  strokes over the upper left chest as  ?indicated in your monitor instructions.  ?Clean area with 4 enclosed alcohol pads. Let dry.  ?Apply patch as indicated in monitor instructions. Patch will be placed under collarbone on left  ?side of chest with arrow pointing upward.  ?Rub patch adhesive wings for 2 minutes. Remove white label marked "1". Remove the white  ?label marked "2". Rub patch adhesive wings for 2 additional minutes.   ?While looking in a mirror, press and release button in center of patch. A small green light will  ?flash 3-4 times. This will be your only indicator that the monitor has been turned on.  ?Do not shower for the first 24 hours. You may shower after the first 24 hours.  ?Press the button if you feel a symptom. You will hear a small click. Record Date, Time and  ?Symptom in the Patient Logbook.  ?When you are ready to remove the patch, follow instructions on the last 2 pages of Patient  ?Logbook. Stick patch monitor onto the last page of Patient Logbook.  ?Place Patient Logbook in the blue and white box. Use locking tab on box and tape box closed  ?securely. The blue and white box has prepaid postage on it. Please place it in the mailbox as  ?soon as possible. Your physician should have your test results approximately 7 days after the  ?monitor has been mailed back to [facility].  ?Call [facility] at [phone] if you have questions regarding  ?your ZIO XT patch monitor. Call them immediately if you see an orange light blinking on your  ?monitor.  ?If your monitor falls off in less than 4 days, contact our Monitor department at [phone].  ?If your monitor becomes loose or falls off after 4 days call [employer] at [phone] for  ?suggestions on securing your monitor ? ?

## 2021-09-14 ENCOUNTER — Ambulatory Visit (INDEPENDENT_AMBULATORY_CARE_PROVIDER_SITE_OTHER): Payer: Medicare Other

## 2021-09-14 DIAGNOSIS — E785 Hyperlipidemia, unspecified: Secondary | ICD-10-CM

## 2021-09-14 DIAGNOSIS — I10 Essential (primary) hypertension: Secondary | ICD-10-CM

## 2021-09-14 DIAGNOSIS — M810 Age-related osteoporosis without current pathological fracture: Secondary | ICD-10-CM

## 2021-09-14 DIAGNOSIS — M069 Rheumatoid arthritis, unspecified: Secondary | ICD-10-CM

## 2021-09-14 NOTE — Patient Instructions (Signed)
Visit Information ? ?Thank you for taking time to visit with me today. Please don't hesitate to contact me if I can be of assistance to you before our next scheduled telephone appointment. ? ?Following are the goals we discussed today:  ?Take all medications as prescribed ?Attend all scheduled provider appointments ?Call pharmacy for medication refills 3-7 days in advance of running out of medications ?Call provider office for new concerns or questions  ?check blood pressure weekly ?keep a blood pressure log ?take blood pressure log to all doctor appointments ?call doctor for signs and symptoms of high blood pressure ?keep all doctor appointments ?take medications for blood pressure exactly as prescribed ?eat more whole grains, fruits and vegetables, lean meats and healthy fats ?limit salt intake to '2300mg'$ /day ?call for medicine refill 2 or 3 days before it runs out ?take all medications exactly as prescribed ?call doctor with any symptoms you believe are related to your medicine ? ?Our next appointment is by telephone on 11/06/21 at 2:15 PM ? ?Please call the care guide team at [phone] if you need to cancel or reschedule your appointment.  ? ?If you are experiencing a Mental Health or [place] or need someone to talk to, please call the Suicide and Crisis Lifeline: 988 ?call the [place] National Suicide Prevention Lifeline: 6310248142 or TTY: 719-270-4491 TTY (201) 347-8350) to talk to a trained counselor ?call 1-800-273-TALK (toll free, 24 hour hotline) ?go to [facility] [address] [place] [phone]) ?call 911  ? ?The patient verbalized understanding of instructions, educational materials, and care plan provided today and agreed to receive a mailed copy of patient instructions, educational materials, and care plan.  ? ?Peter Garter RN, BSN,CCM, CDE ?Care Management Coordinator ?[facility] ?[phone]   ?

## 2021-09-14 NOTE — Chronic Care Management (AMB) (Signed)
?Chronic Care Management  ? ?CCM RN Visit Note ? ?09/14/2021 ?Name: [name] MRN: [id number] DOB: August 31, 1955 ? ?Subjective: ?[name] is a 65 y.o. year old female who is a primary care patient of [name], MD. The care management team was consulted for assistance with disease management and care coordination needs.   ? ?Engaged with patient by telephone for follow up visit in response to provider referral for case management and/or care coordination services.  ? ?Consent to Services:  ?The patient was given information about Chronic Care Management services, agreed to services, and gave verbal consent prior to initiation of services.  Please see initial visit note for detailed documentation.  ? ?Patient agreed to services and verbal consent obtained.  ? ?Assessment: Review of patient past medical history, allergies, medications, health status, including review of consultants reports, laboratory and other test data, was performed as part of comprehensive evaluation and provision of chronic care management services.  ? ?SDOH (Social Determinants of Health) assessments and interventions performed:   ? ?CCM Care Plan ? ?Allergies  ?Allergen Reactions  ? Spinach Itching, Rash and Other (See Comments)  ?  Welts.  ? ? ?Outpatient Encounter Medications as of 09/14/2021  ?Medication Sig  ? Abatacept 125 MG/ML SOSY Inject 125 mg into the skin every Monday. tuesday  ? albuterol (VENTOLIN HFA) 108 (90 Base) MCG/ACT inhaler INHALE 1 PUFF INTO THE LUNGS EVERY 4 HOURS AS NEEDED FOR WHEEZING OR SHORTNESS OF BREATH  ? allopurinol (ZYLOPRIM) 100 MG tablet TAKE 1 TABLET BY MOUTH EVERY DAY  ? amLODipine (NORVASC) 10 MG tablet TAKE 1 TABLET BY MOUTH EVERY DAY  ? aspirin EC 81 MG tablet Take 1 tablet (81 mg total) by mouth daily.  ? CALCIUM PO Take 1 tablet by mouth 2 (two) times daily.  ? cetirizine (ZYRTEC) 10 MG tablet Take 1 tablet (10 mg total) by mouth daily.  ? Cholecalciferol (VITAMIN D) 2000 units CAPS Take  2,000 Units by mouth daily.  ? clobetasol (TEMOVATE) 0.05 % external solution Apply 1 application topically 2 (two) times daily.  ? clobetasol ointment (TEMOVATE) 4.65 % Apply 1 application topically 2 (two) times daily.  ? denosumab (PROLIA) 60 MG/ML SOSY injection Inject 60 mg into the skin every 6 (six) months.  ? diazepam (VALIUM) 2 MG tablet Take 1 tablet (2 mg total) by mouth every 12 (twelve) hours as needed for anxiety.  ? diclofenac sodium (VOLTAREN) 1 % GEL Apply 1 application topically 4 (four) times daily as needed (arthritis).  ? docusate sodium (COLACE) 100 MG capsule Take 100 mg by mouth 2 (two) times daily.  ? esomeprazole (NEXIUM) 40 MG capsule TAKE 1 CAPSULE(40 MG) BY MOUTH DAILY IN THE AFTERNOON  ? folic acid (FOLVITE) 035 MCG tablet Take 400 mcg by mouth daily.  ? [START ON 09/30/2021] HYDROcodone-acetaminophen (NORCO) 10-325 MG tablet Take 1 tablet by mouth every 6 (six) hours as needed for moderate pain.  ? ketoconazole (NIZORAL) 2 % shampoo Apply 1 application topically 2 (two) times a week.  ? megestrol (MEGACE ES) 625 MG/5ML suspension take 5 milliliters by mouth three times daily before meals as needed for appetite  ? Methotrexate Sodium (METHOTREXATE, PF,) 50 MG/2ML injection Inject 0.5 mLs into the muscle once a week. Tues  ? Multiple Vitamin (MULTIVITAMIN WITH MINERALS) TABS tablet Take 1 tablet by mouth daily.  ? PERIDEX 0.12 % solution Use as directed 15 mLs in the mouth or throat 2 (two) times daily.   ?  polyethylene glycol (MIRALAX / GLYCOLAX) 17 g packet Take 17 g by mouth daily as needed for mild constipation.  ? tiZANidine (ZANAFLEX) 4 MG tablet TAKE 1 TABLET(4 MG) BY MOUTH TWICE DAILY  ? traMADol (ULTRAM) 50 MG tablet Take 1-2 tablets (50-100 mg total) by mouth every 6 (six) hours as needed.  ? vitamin B-12 (CYANOCOBALAMIN) 500 MCG tablet Take 500 mcg by mouth daily.  ? vitamin C (ASCORBIC ACID) 500 MG tablet Take 500 mg by mouth daily.   ? ?No facility-administered encounter  medications on file as of 09/14/2021.  ? ? ?Patient Active Problem List  ? Diagnosis Date Noted  ? Partial small bowel obstruction ([place]) 07/04/2020  ? COVID-19 virus infection 07/04/2020  ? Malnutrition of moderate degree 05/11/2020  ? SBO (small bowel obstruction) ([place]) 05/07/2020  ? Left renal mass 10/23/2017  ? Hx of adenomatous polyp of colon 11/13/2016  ? 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 Chronic diastolic CHF (congestive heart failure) ([place])   ? Ventral hernia without obstruction or gangrene 06/06/2015  ? Renal mass   ? Pulmonary HTN ([place]) 01/01/2014  ? AKI (acute kidney injury) ([place]) 01/01/2014  ? Iron deficiency anemia 01/01/2014  ? Atrial flutter ([place]) 12/31/2013  ? Encounter for ostomy care education 11/30/2013  ? Stoma dermatitis 11/30/2013  ? Incisional hernia 11/30/2013  ? Constipation, chronic 11/30/2013  ? Charcot's joint of foot 07/07/2013  ? Bilateral leg edema 04/20/2013  ? 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 Rheumatoid arthritis ([place]) 06/07/2009  ? THYROID NODULE 07/01/2007  ? LUNG NODULE 07/01/2007  ? Osteoporosis 05/07/2007  ? 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 Blindness 04/12/2007  ? HEMORRHOIDS, INTERNAL 04/12/2007  ? 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/14/2007  ? Hyperlipidemia 01/07/2007  ? Essential hypertension 01/07/2007  ? GERD 01/07/2007  ? Marfan's syndrome 01/07/2007  ? Stroke [facility]) 07/2006  ? Chronic gout 2008  ? ? ?Conditions to be addressed/monitored:HTN, HLD, Osteoporosis, and RA ? ?Care Plan : RN Care Manager Plan of Care  ?Updates made by Dimitri Ped, RN since 09/14/2021 12:00 AM  ?  ? ?Problem: Chronic Disease Management and Care Coordination Needs (HTN, HLD, RA, Osteoporosis)   ?Priority: High  ?  ? ?Long-Range Goal: Establish Plan of Care for Chronic Disease Management Needs ((HTN, HLD, RA, Osteoporosis)   ?Start Date: 08/18/2021  ?Expected End Date: 08/18/2022  ?Priority: High  ?Note:   ?Current Barriers:   ?Knowledge Deficits related to plan of care for management of HTN, HLD, Osteoporosis, and RA, chronic pain  ?Care Coordination needs related to ADL IADL limitations, Inability to perform ADL's independently, Inability to perform IADL's independently, and blindness ?Chronic Disease Management support and education needs related to HTN, HLD, Osteoporosis, and RA, chronic pain   ?States she saw her heart doctor yesterday and she is to wear a heart monitor because she has been feeling her heart skip some.  States her B/P has been good when she checks it.  States that her pain is good today and she is taking Tramadol twice a day as ordered. States she has been having some knee pain and she is planning on going to see her orthopedic doctor if it does not improve.  States her bowels are moving with no constipation.  States she has an aide 4 hours a day.  States she tries to eat fruits and vegetables but does not have much of an appetite.  States  she is drinking an Ensure every day. States her  daughter fills her pill box weekly, changes her colostomy bag and  gives her injections.  States her daughter may need surgery for breast cancer and she will need help with these things. ? ?RNCM Clinical Goal(s):  ?Patient will verbalize understanding of plan for management of HTN, HLD, Osteoporosis, and RA, chronic pain  as evidenced by voiced adherence to plan of care ?verbalize basic understanding of  HTN, HLD, Osteoporosis, and RA, chronic pain  disease process and self health management plan as evidenced by voiced understanding and teach back ?take all medications exactly as prescribed and will call provider for medication related questions as evidenced by dispense report and pt verbalization  ?attend all scheduled medical appointments: Annual Wellness visit 10/11/21, CCM PharmD 10/30/21 as evidenced by medical records ?demonstrate Improved adherence to prescribed treatment plan for HTN, HLD, Osteoporosis, and RA, chronic pain  as  evidenced by readings within limits, voiced adherence to plan of care ?continue to work with RN Care Manager to address care management and care coordination needs related to  HTN, HLD, Osteoporosis, and RA, chro

## 2021-09-15 NOTE — Telephone Encounter (Signed)
Dr. Loanne Drilling,  ?It appears that pt has not received Prolia inj over the past year.  ?If you would like for pt to continue with Prolia therapy, please have clinical staff reach out to pt for scheduling and to explain to importance of receiving Prolia injections every 6 months as abrupt cessation of Prolia raises risk of osteoporotic fracture  ? ?Discontinuation of Dmab is associated with a 3- to 5-fold higher risk for vertebral, major osteoporotic, and hip fractures [38,39].  ? ?[email or web address] ?

## 2021-09-17 DIAGNOSIS — I491 Atrial premature depolarization: Secondary | ICD-10-CM

## 2021-09-17 DIAGNOSIS — R002 Palpitations: Secondary | ICD-10-CM | POA: Diagnosis not present

## 2021-09-18 ENCOUNTER — Telehealth: Payer: Self-pay | Admitting: Interventional Cardiology

## 2021-09-18 NOTE — Telephone Encounter (Signed)
Emmaline Life, NP  ?09/14/2021  7:52 AM EDT   ?  ?TSH and magnesium are normal and are not contributing to her frequent PACs. We will continue the treatment plan as discussed at office visit on 3/22.   ? ? ?The patient has been notified of the result and verbalized understanding.  All questions (if any) were answered. ?Darrell Jewel, RN 09/18/2021 4:34 PM   ?

## 2021-09-18 NOTE — Telephone Encounter (Signed)
Patient was calling back for results. Please advise ?

## 2021-09-18 NOTE — Telephone Encounter (Signed)
Pt archived in [email or web address].  Please advise if patient and/or provider wish to proceed with Prolia therpay.  

## 2021-09-19 ENCOUNTER — Other Ambulatory Visit: Payer: Self-pay | Admitting: Family Medicine

## 2021-09-21 ENCOUNTER — Telehealth: Payer: Self-pay | Admitting: Pharmacist

## 2021-09-21 NOTE — Chronic Care Management (AMB) (Signed)
? ? ?Chronic Care Management ?Pharmacy Assistant  ? ?Name: [name]  MRN: [id number] DOB: June 05, 1956 ? ?Reason for Encounter: Disease State / Hypertension Assessment Call ?  ?Conditions to be addressed/monitored: ?HTN ? ?Recent office visits:  ?07/25/2021 [name] MD - Patient was seen for chronic narcotic use and additional issues. Changed Norco 10/325 instructions to every 6 hours as needed. Discontinued Kenalog. No follow up noted.  ? ?Recent consult visits:  ?09/13/2021 Christen Bame NP (heart care) -  Patient was seen for palpitations and additional issues. No medication changes. Follow up in 6 months.  ? ?Hospital visits:  ?Patient was seen at [facility] ED on 07/15/2021 (2 hours) due to hematuria.  ?New?Medications Started at [facility] Discharge:?? ?No medications started ?Medication Changes at Hospital Discharge: ?No medications changed  ?Medications Discontinued at Hospital Discharge: ?No medications discontinued ?Medications that remain the same after Hospital Discharge:??  ?-All other medications will remain the same.   ? ? ?Patient was seen at [facility] ED on 07/10/2021 (7 hours) due to right lower quadrant abdominal pain.  ?New?Medications Started at [facility] Discharge:?? ?No medications started ?Medication Changes at Hospital Discharge: ?No medications changed  ?Medications Discontinued at Hospital Discharge: ?No medications discontinued ?Medications that remain the same after Hospital Discharge:??  ?-All other medications will remain the same.  ? ?Medications: ?Outpatient Encounter Medications as of 09/21/2021  ?Medication Sig  ? traMADol (ULTRAM) 50 MG tablet TAKE 1 TO 2 TABLETS(50 TO 100 MG) BY MOUTH EVERY 6 HOURS AS NEEDED  ? Abatacept 125 MG/ML SOSY Inject 125 mg into the skin every Monday. tuesday  ? albuterol (VENTOLIN HFA) 108 (90 Base) MCG/ACT inhaler INHALE 1 PUFF INTO THE LUNGS EVERY 4 HOURS AS NEEDED FOR WHEEZING OR SHORTNESS OF BREATH  ?  allopurinol (ZYLOPRIM) 100 MG tablet TAKE 1 TABLET BY MOUTH EVERY DAY  ? amLODipine (NORVASC) 10 MG tablet TAKE 1 TABLET BY MOUTH EVERY DAY  ? aspirin EC 81 MG tablet Take 1 tablet (81 mg total) by mouth daily.  ? CALCIUM PO Take 1 tablet by mouth 2 (two) times daily.  ? cetirizine (ZYRTEC) 10 MG tablet Take 1 tablet (10 mg total) by mouth daily.  ? Cholecalciferol (VITAMIN D) 2000 units CAPS Take 2,000 Units by mouth daily.  ? clobetasol (TEMOVATE) 0.05 % external solution Apply 1 application topically 2 (two) times daily.  ? clobetasol ointment (TEMOVATE) 7.82 % Apply 1 application topically 2 (two) times daily.  ? denosumab (PROLIA) 60 MG/ML SOSY injection Inject 60 mg into the skin every 6 (six) months.  ? diazepam (VALIUM) 2 MG tablet Take 1 tablet (2 mg total) by mouth every 12 (twelve) hours as needed for anxiety.  ? diclofenac sodium (VOLTAREN) 1 % GEL Apply 1 application topically 4 (four) times daily as needed (arthritis).  ? docusate sodium (COLACE) 100 MG capsule Take 100 mg by mouth 2 (two) times daily.  ? esomeprazole (NEXIUM) 40 MG capsule TAKE 1 CAPSULE(40 MG) BY MOUTH DAILY IN THE AFTERNOON  ? folic acid (FOLVITE) 956 MCG tablet Take 400 mcg by mouth daily.  ? [START ON 09/30/2021] HYDROcodone-acetaminophen (NORCO) 10-325 MG tablet Take 1 tablet by mouth every 6 (six) hours as needed for moderate pain.  ? ketoconazole (NIZORAL) 2 % shampoo Apply 1 application topically 2 (two) times a week.  ? megestrol (MEGACE ES) 625 MG/5ML suspension take 5 milliliters by mouth three times daily before meals as needed for appetite  ? Methotrexate Sodium (METHOTREXATE,  PF,) 50 MG/2ML injection Inject 0.5 mLs into the muscle once a week. Tues  ? Multiple Vitamin (MULTIVITAMIN WITH MINERALS) TABS tablet Take 1 tablet by mouth daily.  ? PERIDEX 0.12 % solution Use as directed 15 mLs in the mouth or throat 2 (two) times daily.   ? polyethylene glycol (MIRALAX / GLYCOLAX) 17 g packet Take 17 g by mouth daily as needed  for mild constipation.  ? tiZANidine (ZANAFLEX) 4 MG tablet TAKE 1 TABLET(4 MG) BY MOUTH TWICE DAILY  ? vitamin B-12 (CYANOCOBALAMIN) 500 MCG tablet Take 500 mcg by mouth daily.  ? vitamin C (ASCORBIC ACID) 500 MG tablet Take 500 mg by mouth daily.   ? ?No facility-administered encounter medications on file as of 09/21/2021.  ?Fill History: ?ORENCIA  125 MG/ML SOSY 08/25/2021 28  ? ?ALBUTEROL HFA INH (200 PUFFS)8.5GM 09/06/2021 33  ? ?ALLOPURINOL '100MG'$  TABLETS 08/30/2021 90  ? ?CHLORHEXIDINE GLUCONATE  0.12 % SOLN 09/20/2021 31  ? ?CLOBETASOL PROP 0.05% SOL 50ML 07/27/2021 15  ? ?ESOMEPRAZOLE MAGNESIUM '40MG'$  DR CAPS 09/14/2021 90  ? ?METHOTREXATE '25MG'$ /ML PF SINGLE USE 06/27/2021 28  ? ?AMLODIPINE BESYLATE '10MG'$  TABLETS 08/30/2021 90  ? ?TIZANIDINE '4MG'$  TABLETS 09/14/2021 45  ? ?TRAMADOL HCL  50 MG TABS 09/21/2021 23  ? ?HYDROCODONE/ACETAMINOPHEN 10-325 T 09/07/2021 30  ? ?Reviewed chart prior to disease state call. Spoke with patient regarding BP ? ?Recent Office Vitals: ?BP Readings from Last 3 Encounters:  ?09/13/21 118/60  ?07/25/21 138/80  ?07/15/21 122/90  ? ?Pulse Readings from Last 3 Encounters:  ?09/13/21 90  ?07/25/21 94  ?07/15/21 89  ?  ?Wt Readings from Last 3 Encounters:  ?07/25/21 158 lb (71.7 kg)  ?07/15/21 150 lb (68 kg)  ?07/10/21 150 lb (68 kg)  ?  ? ?Kidney Function ?Lab Results  ?Component Value Date/Time  ? CREATININE 0.85 07/10/2021 11:23 AM  ? CREATININE 0.70 01/16/2021 11:45 AM  ? CREATININE 0.76 11/19/2017 10:24 AM  ? GFR 107.53 04/20/2019 12:02 PM  ? GFRNONAA >60 07/10/2021 11:23 AM  ? GFRNONAA 85 11/19/2017 10:24 AM  ? GFRAA 98 11/19/2017 10:24 AM  ? ? ? ?  Latest Ref Rng & Units 07/10/2021  ? 11:23 AM 01/16/2021  ? 11:45 AM 12/20/2020  ? 10:32 AM  ?BMP  ?Glucose 70 - 99 mg/dL 91      ?BUN 8 - 23 mg/dL 9      ?Creatinine 0.44 - 1.00 mg/dL 0.85   0.70     ?Sodium 135 - 145 mmol/L 138      ?Potassium 3.5 - 5.1 mmol/L 3.9      ?Chloride 98 - 111 mmol/L 101      ?CO2 22 - 32 mmol/L 25      ?Calcium  8.9 - 10.3 mg/dL 9.3    9.8    ? ? ?Current antihypertensive regimen:  ?Amlodipine 10 mg daily ? ?How often are you checking your Blood Pressure? Patient states she is checking weekly ? ?Current home BP readings: Patient states her recent readings were 118/76 and 129/81 ? ?What recent interventions/DTPs have been made by any provider to improve Blood Pressure control since last CPP Visit: No recent interventions.  ? ?Any recent hospitalizations or ED visits since last visit with CPP? Patient has had two recent ED visits ? ?What diet changes have been made to improve Blood Pressure Control?  ?Patient follows no specific diet, she eats one meal per day ?Breakfast - patient does not eat breakfast ?Lunch - patient will have  a sandwich, chips and a soda ?Dinner - patient does not eat dinner ? ?What exercise is being done to improve your Blood Pressure Control?  ?Patient denies any regular exercise, she does do some stretching daily and house work ? ?Adherence Review: ?Is the patient currently on ACE/ARB medication? No ?Does the patient have >5 day gap between last estimated fill dates? No ? ?Care Gaps: ?AWV - scheduled for 10/11/21 ?Last BP - 118/60 on 09/13/2021 ?Foot exam - never done ?Eye exam - never done ?Malb - never done  ?Hep C Screen - never done ?Shingrix - never done ?HGBA1C - overdue ?TDAP - overdue  ?Pneumonia vaccine - overdue ?  ?Star Rating Drugs: ?None ? ?[name] CMA  ?Clinical Pharmacist Assistant ?[phone] ? ?

## 2021-09-22 DIAGNOSIS — E785 Hyperlipidemia, unspecified: Secondary | ICD-10-CM

## 2021-09-22 DIAGNOSIS — I10 Essential (primary) hypertension: Secondary | ICD-10-CM

## 2021-10-02 ENCOUNTER — Other Ambulatory Visit: Payer: Self-pay | Admitting: Family Medicine

## 2021-10-03 NOTE — Telephone Encounter (Signed)
Last refill--05/17/2020--160m, 5 refills ?Last OV--07/25/2021 ? ?No future  OV scheduled.  Can this patient receive a refill? ?

## 2021-10-04 ENCOUNTER — Telehealth: Payer: Self-pay | Admitting: Family Medicine

## 2021-10-04 DIAGNOSIS — M0579 Rheumatoid arthritis with rheumatoid factor of multiple sites without organ or systems involvement: Secondary | ICD-10-CM | POA: Diagnosis not present

## 2021-10-04 DIAGNOSIS — M1A09X0 Idiopathic chronic gout, multiple sites, without tophus (tophi): Secondary | ICD-10-CM | POA: Diagnosis not present

## 2021-10-04 DIAGNOSIS — R5383 Other fatigue: Secondary | ICD-10-CM | POA: Diagnosis not present

## 2021-10-04 DIAGNOSIS — Z79899 Other long term (current) drug therapy: Secondary | ICD-10-CM | POA: Diagnosis not present

## 2021-10-04 DIAGNOSIS — M1991 Primary osteoarthritis, unspecified site: Secondary | ICD-10-CM | POA: Diagnosis not present

## 2021-10-04 NOTE — Telephone Encounter (Signed)
Please advise 

## 2021-10-04 NOTE — Telephone Encounter (Signed)
Joy called checking the status of finding Ar[facility] a [place] since her daughter is unable to change her colonoscopy bag. Joy provided some places that is in network with her [employer] INS: ?[facility] ?[facility] ?[facility] ?[place] ? ?Call Joy with any questions [phone] ? ?Please advise. ?

## 2021-10-05 NOTE — Telephone Encounter (Signed)
Message sent to [facility] referral coordinator.   ?

## 2021-10-05 NOTE — Telephone Encounter (Signed)
I thought we had already done this referral. Please look into it  ?

## 2021-10-06 DIAGNOSIS — Z933 Colostomy status: Secondary | ICD-10-CM | POA: Diagnosis not present

## 2021-10-11 ENCOUNTER — Ambulatory Visit: Payer: Medicare Other

## 2021-10-11 ENCOUNTER — Telehealth: Payer: Self-pay

## 2021-10-11 DIAGNOSIS — I491 Atrial premature depolarization: Secondary | ICD-10-CM | POA: Diagnosis not present

## 2021-10-11 DIAGNOSIS — R002 Palpitations: Secondary | ICD-10-CM | POA: Diagnosis not present

## 2021-10-11 NOTE — Telephone Encounter (Signed)
Contacted patient on preferred number listed in notes for scheduled AWV. Patient family member stated patient unable to complete visit today will call to reschedule. ?

## 2021-10-12 DIAGNOSIS — R5381 Other malaise: Secondary | ICD-10-CM | POA: Diagnosis not present

## 2021-10-13 ENCOUNTER — Telehealth: Payer: Self-pay | Admitting: Family Medicine

## 2021-10-13 NOTE — Telephone Encounter (Signed)
Patient has appointment scheduled 10/20/21 ?

## 2021-10-13 NOTE — Telephone Encounter (Signed)
Pt requesting prescription for ear drops provider prescribed to her around 2020. States her ears are draining and it's affecting her hearing and she has issues hearing with her hearing aids. (fluid in hearing aids) Offered an appointment and declined stating she has an upcoming appointment.  ?

## 2021-10-16 ENCOUNTER — Other Ambulatory Visit: Payer: Self-pay | Admitting: Interventional Radiology

## 2021-10-16 ENCOUNTER — Other Ambulatory Visit: Payer: Self-pay | Admitting: Urology

## 2021-10-16 ENCOUNTER — Ambulatory Visit (INDEPENDENT_AMBULATORY_CARE_PROVIDER_SITE_OTHER): Payer: Medicare Other

## 2021-10-16 VITALS — Ht 68.0 in | Wt 150.0 lb

## 2021-10-16 DIAGNOSIS — Z0000 Encounter for general adult medical examination without abnormal findings: Secondary | ICD-10-CM | POA: Diagnosis not present

## 2021-10-16 DIAGNOSIS — N2889 Other specified disorders of kidney and ureter: Secondary | ICD-10-CM

## 2021-10-16 NOTE — Progress Notes (Signed)
?I connected with [name] today by telephone and verified that I am speaking with the correct person using two identifiers. ?Location patient: home ?Location provider: work ?Persons participating in the virtual visit: [name], Donze LPN. ?  ?I discussed the limitations, risks, security and privacy concerns of performing an evaluation and management service by telephone and the availability of in person appointments. I also discussed with the patient that there may be a patient responsible charge related to this service. The patient expressed understanding and verbally consented to this telephonic visit.  ?  ?Interactive audio and video telecommunications were attempted between this provider and patient, however failed, due to patient having technical difficulties OR patient did not have access to video capability.  We continued and completed visit with audio only. ? ?  ? ?Vital signs may be patient reported or missing. ? ?Subjective:  ? [name] is a 66 y.o. female who presents for Medicare Annual (Subsequent) preventive examination. ? ?Review of Systems    ? ?Cardiac Risk Factors include: advanced age (>30mn, >>52women);dyslipidemia;hypertension ? ?   ?Objective:  ?  ?Today's Vitals  ? 10/16/21 1501  ?Weight: 150 lb (68 kg)  ?Height: '5\' 8"'$  (1.727 m)  ?PainSc: 4   ? ?Body mass index is 22.81 kg/m?. ? ? ?  10/16/2021  ?  3:12 PM 08/18/2021  ?  2:12 PM 07/15/2021  ?  4:08 PM 07/10/2021  ? 10:22 AM 10/05/2020  ?  2:36 PM 07/05/2020  ?  8:43 AM 05/12/2020  ?  8:00 AM  ?Advanced Directives  ?Does Patient Have a Medical Advance Directive? No No No No No No No  ?Would patient like information on creating a medical advance directive?  No - Patient declined No - Patient declined  No - Patient declined No - Patient declined No - Patient declined  ? ? ?Current Medications (verified) ?Outpatient Encounter Medications as of 10/16/2021  ?Medication Sig  ? Abatacept 125 MG/ML SOSY Inject 125 mg into the skin  once a week. Has on Sunday  ? albuterol (VENTOLIN HFA) 108 (90 Base) MCG/ACT inhaler INHALE 1 PUFF INTO THE LUNGS EVERY 4 HOURS AS NEEDED FOR WHEEZING OR SHORTNESS OF BREATH  ? allopurinol (ZYLOPRIM) 100 MG tablet TAKE 1 TABLET BY MOUTH EVERY DAY  ? amLODipine (NORVASC) 10 MG tablet TAKE 1 TABLET BY MOUTH EVERY DAY  ? aspirin EC 81 MG tablet Take 1 tablet (81 mg total) by mouth daily.  ? CALCIUM PO Take 1 tablet by mouth 2 (two) times daily.  ? cetirizine (ZYRTEC) 10 MG tablet Take 1 tablet (10 mg total) by mouth daily.  ? Cholecalciferol (VITAMIN D) 2000 units CAPS Take 2,000 Units by mouth daily.  ? clobetasol (TEMOVATE) 0.05 % external solution Apply 1 application topically 2 (two) times daily.  ? clobetasol ointment (TEMOVATE) 07.35% Apply 1 application topically 2 (two) times daily.  ? diazepam (VALIUM) 2 MG tablet Take 1 tablet (2 mg total) by mouth every 12 (twelve) hours as needed for anxiety.  ? diclofenac sodium (VOLTAREN) 1 % GEL Apply 1 application topically 4 (four) times daily as needed (arthritis).  ? docusate sodium (COLACE) 100 MG capsule Take 100 mg by mouth 2 (two) times daily.  ? esomeprazole (NEXIUM) 40 MG capsule TAKE 1 CAPSULE(40 MG) BY MOUTH DAILY IN THE AFTERNOON  ? folic acid (FOLVITE) 4329MCG tablet Take 400 mcg by mouth daily.  ? HYDROcodone-acetaminophen (NORCO) 10-325 MG tablet Take 1 tablet by mouth every  6 (six) hours as needed for moderate pain.  ? ketoconazole (NIZORAL) 2 % shampoo Apply 1 application topically 2 (two) times a week.  ? megestrol (MEGACE ES) 625 MG/5ML suspension SHAKE LIQUID AND TAKE 5 ML BY MOUTH THREE TIMES DAILY BEFORE MEALS AS NEEDED FOR APPETITE  ? Methotrexate Sodium (METHOTREXATE, PF,) 50 MG/2ML injection Inject 0.5 mLs into the muscle once a week. Sunday  ? Multiple Vitamin (MULTIVITAMIN WITH MINERALS) TABS tablet Take 1 tablet by mouth daily.  ? PERIDEX 0.12 % solution Use as directed 15 mLs in the mouth or throat 2 (two) times daily.   ? polyethylene  glycol (MIRALAX / GLYCOLAX) 17 g packet Take 17 g by mouth daily as needed for mild constipation.  ? tiZANidine (ZANAFLEX) 4 MG tablet TAKE 1 TABLET(4 MG) BY MOUTH TWICE DAILY  ? traMADol (ULTRAM) 50 MG tablet TAKE 1 TO 2 TABLETS(50 TO 100 MG) BY MOUTH EVERY 6 HOURS AS NEEDED  ? vitamin B-12 (CYANOCOBALAMIN) 500 MCG tablet Take 500 mcg by mouth daily.  ? vitamin C (ASCORBIC ACID) 500 MG tablet Take 500 mg by mouth daily.   ? denosumab (PROLIA) 60 MG/ML SOSY injection Inject 60 mg into the skin every 6 (six) months. (Patient not taking: Reported on 10/16/2021)  ? ?No facility-administered encounter medications on file as of 10/16/2021.  ? ? ?Allergies (verified) ?Spinach  ? ?History: ?Past Medical History:  ?Diagnosis Date  ? Atrial flutter ([place])   ? CHF (congestive heart failure) ([place])   ? Chronic gout 2008  ? Diverticulitis of large intestine with perforation   ? Dysrhythmia   ? Fibroid   ? Hearing aid worn   ? pt wears bilateral hearing aids  ? Hernia 4/08  ? Hx of adenomatous polyp of colon 11/13/2016  ? Hypertension   ? Legally blind   ? since pt was a teenager  ? 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 Osteoporosis   ? 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 Rheumatoid arthritis ([place])   ? sees Dr. Gavin Pound   ? SBO (small bowel obstruction) ([place]) 01/05/2013  ? Small bowel obstruction due to adhesions [facility])   ? Status post bunionectomy 1/10  ? bilateral   ? Stroke ([place]) 2/08  ? 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 bilateral   ? ?Past Surgical History:  ?Procedure Laterality Date  ? ABLATION  01/04/14  ? atrial flutter ablation (2 circuits) by Dr Lovena Le  ? ATRIAL FLUTTER ABLATION N/A 01/04/2014  ? Procedure: ATRIAL FLUTTER ABLATION;  Surgeon: Evans Lance, MD;  Location: [facility] CATH LAB;  Service: Cardiovascular;  Laterality: N/A;  ? BUNIONECTOMY Bilateral 1/10  ? COLON SURGERY    ? colectomy  ? COLOSTOMY  11/27/2012  ? HERNIA REPAIR    ? IR  RADIOLOGIST EVAL & MGMT  10/01/2017  ? IR RADIOLOGIST EVAL & MGMT  11/21/2017  ? IR RADIOLOGIST EVAL & MGMT  02/13/2018  ? IR RADIOLOGIST EVAL & MGMT  01/08/2019  ? IR RADIOLOGIST EVAL & MGMT  12/29/2019  ? IR RADIOLOGIST EVAL & MGMT  01/31/2021  ? LAPAROSCOPIC ABDOMINAL EXPLORATION N/A 01/06/2013  ? Procedure: LAPAROSCOPIC ABDOMINAL EXPLORATION;  Surgeon: Adin Hector, MD;  Location: [facility] ORS;  Service: General;  Laterality: N/A;  ? LAPAROSCOPIC LYSIS OF ADHESIONS N/A 01/06/2013  ? Procedure: LAPAROSCOPIC LYSIS OF ADHESIONS/ INTEROTOMY REPAIR;  Surgeon: Adin Hector, MD;  Location: [facility] ORS;  Service: General;  Laterality: N/A;  ? LAPAROTOMY N/A 11/27/2012  ? Procedure: EXPLORATORY LAPAROTOMY SIGMOID COLECTOMY, COLOSTOMY;  Surgeon: Madilyn Hook, DO;  Location: [facility] ORS;  Service: General;  Laterality: N/A;  ? RADIOLOGY WITH ANESTHESIA Left 10/23/2017  ? Procedure: CT RENAL CRYO AND BIOPSY;  Surgeon: Aletta Edouard, MD;  Location: [facility] OR[facility];  Service: Radiology;  Laterality: Left;  ? TOTAL KNEE ARTHROPLASTY Bilateral 6/08  ? ?Family History  ?Problem Relation Age of Onset  ? Heart attack Neg Hx   ? Colon cancer Neg Hx   ? Osteoporosis Neg Hx   ? ?Social History  ? ?Socioeconomic History  ? Marital status: Single  ?  Spouse name: Not on file  ? Number of children: 1  ? Years of education: Not on file  ? Highest education level: Not on file  ?Occupational History  ? Not on file  ?Tobacco Use  ? Smoking status: Former  ?  Packs/day: 1.00  ?  Types: Cigarettes  ?  Quit date: 11/27/2012  ?  Years since quitting: 8.8  ? Smokeless tobacco: Never  ?Vaping Use  ? Vaping Use: Former  ?Substance and Sexual Activity  ? Alcohol use: No  ?  Alcohol/week: 0.0 standard drinks  ?  Comment: recovering alcoholic sober since [id number]  ? Drug use: Yes  ?  Types: Hydrocodone  ? Sexual activity: Not Currently  ?  Partners: Male  ?  Birth control/protection: None  ?Other Topics Concern  ? Not on file  ?Social History Narrative  ? Not on file  ? ?Social  Determinants of Health  ? ?Financial Resource Strain: Low Risk   ? Difficulty of Paying Living Expenses: Not hard at all  ?Food Insecurity: No Food Insecurity  ? Worried About Charity fundraiser in the Last Year: Leodis Binet

## 2021-10-16 NOTE — Patient Instructions (Signed)
[name] , ?Thank you for taking time to come for your Medicare Wellness Visit. I appreciate your ongoing commitment to your health goals. Please review the following plan we discussed and let me know if I can assist you in the future.  ? ?Screening recommendations/referrals: ?Colonoscopy: completed 11/06/2016, due 11/06/2021 ?Mammogram: completed 06/06/2021, due 06/07/2022 ?Bone Density: completed 10/05/2019 ?Recommended yearly ophthalmology/optometry visit for glaucoma screening and checkup ?Recommended yearly dental visit for hygiene and checkup ? ?Vaccinations: ?Influenza vaccine: due 01/23/2022 ?Pneumococcal vaccine: completed 07/18/2015 ?Tdap vaccine: due ?Shingles vaccine: discussed   ?Covid-19: decline ? ?Advanced directives: Advance directive discussed with you today.  ? ?Conditions/risks identified: none ? ?Next appointment: Follow up in one year for your annual wellness visit  ? ? ?Preventive Care 66 Years and Older, Female ?Preventive care refers to lifestyle choices and visits with your health care provider that can promote health and wellness. ?What does preventive care include? ?A yearly physical exam. This is also called an annual well check. ?Dental exams once or twice a year. ?Routine eye exams. Ask your health care provider how often you should have your eyes checked. ?Personal lifestyle choices, including: ?Daily care of your teeth and gums. ?Regular physical activity. ?Eating a healthy diet. ?Avoiding tobacco and drug use. ?Limiting alcohol use. ?Practicing safe sex. ?Taking low-dose aspirin every day. ?Taking vitamin and mineral supplements as recommended by your health care provider. ?What happens during an annual well check? ?The services and screenings done by your health care provider during your annual well check will depend on your age, overall health, lifestyle risk factors, and family history of disease. ?Counseling  ?Your health care provider may ask you questions about your: ?Alcohol  use. ?Tobacco use. ?Drug use. ?Emotional well-being. ?Home and relationship well-being. ?Sexual activity. ?Eating habits. ?History of falls. ?Memory and ability to understand (cognition). ?Work and work Statistician. ?Reproductive health. ?Screening  ?You may have the following tests or measurements: ?Height, weight, and BMI. ?Blood pressure. ?Lipid and cholesterol levels. These may be checked every 5 years, or more frequently if you are over 18 years old. ?Skin check. ?Lung cancer screening. You may have this screening every year starting at age 22 if you have a 30-pack-year history of smoking and currently smoke or have quit within the past 15 years. ?Fecal occult blood test (FOBT) of the stool. You may have this test every year starting at age 61. ?Flexible sigmoidoscopy or colonoscopy. You may have a sigmoidoscopy every 5 years or a colonoscopy every 10 years starting at age 62. ?Hepatitis C blood test. ?Hepatitis B blood test. ?Sexually transmitted disease (STD) testing. ?Diabetes screening. This is done by checking your blood sugar (glucose) after you have not eaten for a while (fasting). You may have this done every 1-3 years. ?Bone density scan. This is done to screen for osteoporosis. You may have this done starting at age 14. ?Mammogram. This may be done every 1-2 years. Talk to your health care provider about how often you should have regular mammograms. ?Talk with your health care provider about your test results, treatment options, and if necessary, the need for more tests. ?Vaccines  ?Your health care provider may recommend certain vaccines, such as: ?Influenza vaccine. This is recommended every year. ?Tetanus, diphtheria, and acellular pertussis (Tdap, Td) vaccine. You may need a Td booster every 10 years. ?Zoster vaccine. You may need this after age 4. ?Pneumococcal 13-valent conjugate (PCV13) vaccine. One dose is recommended after age 52. ?Pneumococcal polysaccharide (PPSV23) vaccine. One dose is  recommended after age 102. ?Talk to your health care provider about which screenings and vaccines you need and how often you need them. ?This information is not intended to replace advice given to you by your health care provider. Make sure you discuss any questions you have with your health care provider. ?Document Released: 07/08/2015 Document Revised: 02/29/2016 Document Reviewed: 04/12/2015 ?Elsevier Interactive Patient Education ? 2017 [place]. ? ?Fall Prevention in the Home ?Falls can cause injuries. They can happen to people of all ages. There are many things you can do to make your home safe and to help prevent falls. ?What can I do on the outside of my home? ?Regularly fix the edges of walkways and driveways and fix any cracks. ?Remove anything that might make you trip as you walk through a door, such as a raised step or threshold. ?Trim any bushes or trees on the path to your home. ?Use bright outdoor lighting. ?Clear any walking paths of anything that might make someone trip, such as rocks or tools. ?Regularly check to see if handrails are loose or broken. Make sure that both sides of any steps have handrails. ?Any raised decks and porches should have guardrails on the edges. ?Have any leaves, snow, or ice cleared regularly. ?Use sand or salt on walking paths during winter. ?Clean up any spills in your garage right away. This includes oil or grease spills. ?What can I do in the bathroom? ?Use night lights. ?Install grab bars by the toilet and in the tub and shower. Do not use towel bars as grab bars. ?Use non-skid mats or decals in the tub or shower. ?If you need to sit down in the shower, use a plastic, non-slip stool. ?Keep the floor dry. Clean up any water that spills on the floor as soon as it happens. ?Remove soap buildup in the tub or shower regularly. ?Attach bath mats securely with double-sided non-slip rug tape. ?Do not have throw rugs and other things on the floor that can make you  trip. ?What can I do in the bedroom? ?Use night lights. ?Make sure that you have a light by your bed that is easy to reach. ?Do not use any sheets or blankets that are too big for your bed. They should not hang down onto the floor. ?Have a firm chair that has side arms. You can use this for support while you get dressed. ?Do not have throw rugs and other things on the floor that can make you trip. ?What can I do in the kitchen? ?Clean up any spills right away. ?Avoid walking on wet floors. ?Keep items that you use a lot in easy-to-reach places. ?If you need to reach something above you, use a strong step stool that has a grab bar. ?Keep electrical cords out of the way. ?Do not use floor polish or wax that makes floors slippery. If you must use wax, use non-skid floor wax. ?Do not have throw rugs and other things on the floor that can make you trip. ?What can I do with my stairs? ?Do not leave any items on the stairs. ?Make sure that there are handrails on both sides of the stairs and use them. Fix handrails that are broken or loose. Make sure that handrails are as long as the stairways. ?Check any carpeting to make sure that it is firmly attached to the stairs. Fix any carpet that is loose or worn. ?Avoid having throw rugs at the top or bottom of the stairs. If you  do have throw rugs, attach them to the floor with carpet tape. ?Make sure that you have a light switch at the top of the stairs and the bottom of the stairs. If you do not have them, ask someone to add them for you. ?What else can I do to help prevent falls? ?Wear shoes that: ?Do not have high heels. ?Have rubber bottoms. ?Are comfortable and fit you well. ?Are closed at the toe. Do not wear sandals. ?If you use a stepladder: ?Make sure that it is fully opened. Do not climb a closed stepladder. ?Make sure that both sides of the stepladder are locked into place. ?Ask someone to hold it for you, if possible. ?Clearly mark and make sure that you can  see: ?Any grab bars or handrails. ?First and last steps. ?Where the edge of each step is. ?Use tools that help you move around (mobility aids) if they are needed. These include: ?Canes. ?Walkers. ?Scooters. ?Crutches. ?Turn

## 2021-10-17 MED ORDER — CIPROFLOXACIN-DEXAMETHASONE 0.3-0.1 % OT SUSP: 4.0000 [drp] | Freq: Two times a day (BID) | OTIC | 5 refills | Status: DC

## 2021-10-17 NOTE — Telephone Encounter (Signed)
Patient informed of the message below.

## 2021-10-17 NOTE — Telephone Encounter (Signed)
I refilled Ciprodex drops  ?

## 2021-10-18 ENCOUNTER — Telehealth: Payer: Self-pay | Admitting: Family Medicine

## 2021-10-18 NOTE — Telephone Encounter (Signed)
Joy called checking the status of finding Lex[facility] since her daughter is unable to change her colonoscopy bag. Joy provided some places that is in network with her [employer] INS: ?[place] ?[facility] ?[place] ?[place] ?  ?Call Joy with any questions [phone] ?  ?Please advise.  Joy called in originally on 10/04/2021 and would appreciate immediate action ?

## 2021-10-19 ENCOUNTER — Other Ambulatory Visit: Payer: Self-pay

## 2021-10-19 DIAGNOSIS — Z433 Encounter for attention to colostomy: Secondary | ICD-10-CM

## 2021-10-19 NOTE — Telephone Encounter (Signed)
New referral was placed today, advised to send referral to any of the agencies provided in the encounter, pt will be updated about referral at her office visit tomorrow 10/20/2021 ?

## 2021-10-20 ENCOUNTER — Telehealth (INDEPENDENT_AMBULATORY_CARE_PROVIDER_SITE_OTHER): Payer: Medicare Other | Admitting: Family Medicine

## 2021-10-20 ENCOUNTER — Encounter: Payer: Self-pay | Admitting: Family Medicine

## 2021-10-20 DIAGNOSIS — F1190 Opioid use, unspecified, uncomplicated: Secondary | ICD-10-CM | POA: Diagnosis not present

## 2021-10-20 DIAGNOSIS — M069 Rheumatoid arthritis, unspecified: Secondary | ICD-10-CM | POA: Diagnosis not present

## 2021-10-20 MED ORDER — HYDROCODONE-ACETAMINOPHEN 10-325 MG PO TABS
1.0000 | ORAL_TABLET | Freq: Four times a day (QID) | ORAL | 0 refills | Status: DC | PRN
Start: 2021-10-30 — End: 2021-10-20

## 2021-10-20 MED ORDER — HYDROCODONE-ACETAMINOPHEN 10-325 MG PO TABS
1.0000 | ORAL_TABLET | Freq: Four times a day (QID) | ORAL | 0 refills | Status: DC | PRN
Start: 2021-12-30 — End: 2022-01-19

## 2021-10-20 MED ORDER — HYDROCODONE-ACETAMINOPHEN 10-325 MG PO TABS: 1.0000 | ORAL_TABLET | Freq: Four times a day (QID) | ORAL | 0 refills | Status: DC | PRN

## 2021-10-20 MED ORDER — METOPROLOL SUCCINATE ER 25 MG PO TB24: 25.0000 mg | ORAL_TABLET | Freq: Every day | ORAL | 2 refills | Status: DC

## 2021-10-20 MED ORDER — AMLODIPINE BESYLATE 5 MG PO TABS: 5.0000 mg | ORAL_TABLET | Freq: Every day | ORAL | 2 refills | Status: DC

## 2021-10-20 NOTE — Telephone Encounter (Signed)
She asks about the long term monitor results from last week. This showed 5 runs of VT with the longest lasting 8.5 seconds and a max rate of 210, and it showed 278 runs of SVT with the longest lasting 16.0 seconds and a max rate of 210. Cardiology was anticipating starting her back on a beta blocker, but she has not heard from them and she wants to get started asap. She has been feeling well. We agreed to decrease the Amlodipine to 5 mg daily and we will add Metoprolol succinate 25 mg daily. She will follow up in 3-4 weeks.  ?

## 2021-10-20 NOTE — Progress Notes (Signed)
? ?  Subjective:  ? ? Patient ID: [name], female    DOB: 12-19-1955, 66 y.o.   MRN: [id number] ? ?HPI ?Virtual Visit via Telephone Note ? ?I connected with the patient on 10/20/21 at  2:15 PM EDT by telephone and verified that I am speaking with the correct person using two identifiers. ?  ?I discussed the limitations, risks, security and privacy concerns of performing an evaluation and management service by telephone and the availability of in person appointments. I also discussed with the patient that there may be a patient responsible charge related to this service. The patient expressed understanding and agreed to proceed. ? ?Location patient: home ?Location provider: work or home office ?Participants present for the call: patient, provider ?Patient did not have a visit in the prior 7 days to address this/these issue(s). ? ? ?History of Present Illness: ?Here for pain management. She is doing fairly well.  ?  ?Observations/Objective: ?Patient sounds cheerful and well on the phone. ?I do not appreciate any SOB. ?Speech and thought processing are grossly intact. ?Patient reported vitals: ? ?Assessment and Plan: ?Pain management. ?Indication for chronic opioid: RA ?Medication and dose: Norco 10-325 ?# pills per month: 120 ?Last UDS date: 07-25-21 ?Opioid Treatment Agreement signed (Y/N): 07-12-17 ?Opioid Treatment Agreement last reviewed with patient:  10-20-21 ?[facility] reviewed this encounter (include red flags): Yes ?Meds were refilled.  ?[name], MD ? ? ?Follow Up Instructions: ? ? ? ? ?[id number] 5-10 ?[id number] 11-20 ?[id number] 21-30 ?I did not refer this patient for an OV in the next 24 hours for this/these issue(s). ? ?I discussed the assessment and treatment plan with the patient. The patient was provided an opportunity to ask questions and all were answered. The patient agreed with the plan and demonstrated an understanding of the instructions. ?  ?The patient was advised to call back or seek an in-person  evaluation if the symptoms worsen or if the condition fails to improve as anticipated. ? ?I provided  16 minutes of non-face-to-face time during this encounter. ? ? ?[name], MD   ? ? ?Review of Systems ? ?   ?Objective:  ? Physical Exam ? ? ? ? ?   ?Assessment & Plan:  ? ? ?

## 2021-10-26 ENCOUNTER — Other Ambulatory Visit: Payer: Self-pay | Admitting: Interventional Radiology

## 2021-10-26 ENCOUNTER — Other Ambulatory Visit: Payer: Self-pay | Admitting: Urology

## 2021-10-26 DIAGNOSIS — N2889 Other specified disorders of kidney and ureter: Secondary | ICD-10-CM

## 2021-10-26 NOTE — Telephone Encounter (Signed)
Noted  

## 2021-10-27 ENCOUNTER — Telehealth: Payer: Self-pay | Admitting: Pharmacist

## 2021-10-27 NOTE — Chronic Care Management (AMB) (Signed)
    Chronic Care Management Pharmacy Assistant   Name: [name]  MRN: [id number] DOB: 1956-02-22  10/30/2021 [place], No answer, left message of appointment on 10/30/2021 at 4:00 via telephone visit with [name], Pharm D. Notified to have all medications, supplements, blood pressure and/or blood sugar logs available during appointment and to return call if need to reschedule.   Care Gaps: AWV - completed 10/16/2021 Last BP - 118/60 on 09/13/2021 Foot exam - never done Eye exam - never done Malb - never done  Hep C Screen - never done Shingrix - never done HGBA1C - overdue TDAP - overdue  Pneumonia vaccine - overdue   Star Rating Drugs: None   Any gaps in medications fill history? No  [name] [facility]  Catering manager [phone]

## 2021-10-30 ENCOUNTER — Ambulatory Visit (INDEPENDENT_AMBULATORY_CARE_PROVIDER_SITE_OTHER): Payer: Medicare Other | Admitting: Pharmacist

## 2021-10-30 DIAGNOSIS — E785 Hyperlipidemia, unspecified: Secondary | ICD-10-CM

## 2021-10-30 DIAGNOSIS — I10 Essential (primary) hypertension: Secondary | ICD-10-CM

## 2021-10-30 NOTE — Progress Notes (Signed)
Chronic Care Management Pharmacy Note  05/02/21 Name:  LYNESHA BANGO MRN:  [id number] DOB:  [birth year]  Summary: LDL above goal < 70 BP at goal < 130/80 per home readings  Recommendations/Changes made from today's visit: -Recommend initiating rosuvastatin 20 mg daily for LDL lowering and secondary stroke prevention -Recommended bringing BP cuff to next office visit to ensure accuracy -Recommend repeat lipid panel   Plan: Scheduled PCP follow up for BP assessment Follow up HLD assessment in 2-3 months  Subjective: KELLSIE GRINDLE is an 66 y.o. year old female who is a primary patient of [name], MD.  The CCM team was consulted for assistance with disease management and care coordination needs.    Engaged with patient by telephone for follow up visit in response to provider referral for pharmacy case management and/or care coordination services.   Consent to Services:  The patient was given information about Chronic Care Management services, agreed to services, and gave verbal consent prior to initiation of services.  Please see initial visit note for detailed documentation.   Patient Care Team: [name], MD as PCP - General Tamala Julian Lynnell Dike, MD as PCP - Cardiology (Cardiology) Gatha Mayer, MD as Consulting Physician (Gastroenterology) Gavin Pound, MD as Consulting Physician (Rheumatology) Viona Gilmore, [facility] as Pharmacist (Pharmacist) Dimitri Ped, RN as Case Manager  Recent office visits: 10/20/21 [name] MD: Patient presented for video visit for chronic narcotic use. Refilled hydrocodone -acetaminophen 10-327m.   10/18/21 Telephone encounter: Prescribed metoprolol succinate 25 mg from results of heart monitor and decreased amlodipine to 5 mg daily. Follow up in 3-4 weeks.  10/16/21 NGlenna Durand LPN: Patient presented for AWV.  07/25/2021 SAlysia PennaMD - Patient was seen for chronic narcotic use and additional issues. Changed Norco 10/325  instructions to every 6 hours as needed. Discontinued Kenalog. No follow up noted.   05/03/2021 SAlysia PennaMD - Patient was seen for chronic narcotic use and additional issues. Started Norco 10/325 1 tablet every 6 hours as needed. No follow up noted.  Recent consult visits: 10/09/21 AGavin Pound(rheumatology): Patient presented for RA follow up. Unable to access notes.  09/13/2021 MChristen BameNP (heart care) -  Patient was seen for palpitations and additional issues. No medication changes. Follow up in 6 months.   01/23/21 KLeanora Cover(Orthopaedics) - seen for initial consult for bilateral hand pain and rheumatoid arthritis. No medication changes or follow up noted.    12/20/20 SRenato Shin MD (endo): Patient presented for osteoporosis follow up. Plan to delay Prolia until after dental work.  [facility]: [facility] due to hematuria.  [facility] No medications started Medication Changes at Hospital Discharge: No medications changed  Medications Discontinued at Hospital Discharge: No medications discontinued Medications that remain the same after Hospital Discharge:??  -All other medications will remain the same.       [facility] due to right lower quadrant abdominal pain.  [facility] No medications started Medication Changes at Hospital Discharge: No medications changed  Medications Discontinued at Hospital Discharge: No medications discontinued Medications that remain the same after Hospital Discharge:??  -All other medications will remain the same.    Objective:  Lab Results  Component Value Date   CREATININE 0.85 07/10/2021   BUN 9 07/10/2021   GFR 107.53  04/20/2019   GFRNONAA >60 07/10/2021   GFRAA 98 11/19/2017   NA 138 07/10/2021   K 3.9 07/10/2021    CALCIUM 9.3 07/10/2021   CO2 25 07/10/2021   GLUCOSE 91 07/10/2021    Lab Results  Component Value Date/Time   HGBA1C 5.6 06/09/2015 08:30 PM   HGBA1C 5.6 09/26/2009 03:53 PM   GFR 107.53 04/20/2019 12:02 PM   GFR 103.39 12/20/2014 11:31 AM    Last diabetic Eye exam: No results found for: HMDIABEYEEXA  Last diabetic Foot exam: No results found for: HMDIABFOOTEX   Lab Results  Component Value Date   CHOL 238 (H) 06/20/2020   HDL 76.10 06/20/2020   LDLCALC 145 (H) 06/20/2020   TRIG 86.0 06/20/2020   CHOLHDL 3 06/20/2020       Latest Ref Rng & Units 07/10/2021   11:23 AM 07/06/2020    3:18 AM 07/05/2020    4:00 AM  Hepatic Function  Total Protein 6.5 - 8.1 g/dL 7.2   6.5   6.5    Albumin 3.5 - 5.0 g/dL 3.9   3.4   3.5    AST 15 - 41 U/L 24   35   28    ALT 0 - 44 U/L '18   25   23    ' Alk Phosphatase 38 - 126 U/L 95   65   68    Total Bilirubin 0.3 - 1.2 mg/dL 1.1   0.6   0.6      Lab Results  Component Value Date/Time   TSH 0.901 09/13/2021 12:27 PM   TSH 1.93 06/20/2020 11:42 AM   FREET4 0.94 06/20/2020 11:42 AM       Latest Ref Rng & Units 07/10/2021   11:23 AM 07/06/2020    3:18 AM 07/05/2020    4:00 AM  CBC  WBC 4.0 - 10.5 K/uL 6.7   4.2   5.1    Hemoglobin 12.0 - 15.0 g/dL 12.1   12.3   11.6    Hematocrit 36.0 - 46.0 % 36.5   38.5   36.4    Platelets 150 - 400 K/uL 311   206   221      Lab Results  Component Value Date/Time   VD25OH 74.85 12/20/2020 10:32 AM   VD25OH 66.24 12/21/2019 03:04 PM    Clinical ASCVD: Yes  The ASCVD Risk score (Arnett DK, et al., 2019) failed to calculate for the following reasons:   The patient has a prior MI or stroke diagnosis       10/16/2021    3:13 PM 08/18/2021    1:48 PM 12/20/2020   10:14 AM  Depression screen PHQ 2/9  Decreased Interest 0 0 0  Down, Depressed, Hopeless 0 0 0  PHQ - 2 Score 0 0 0     Social History   Tobacco Use  Smoking Status Former   Packs/day: 1.00   Types: Cigarettes   Quit date:  11/27/2012   Years since quitting: 8.9  Smokeless Tobacco Never   BP Readings from Last 3 Encounters:  09/13/21 118/60  07/25/21 138/80  07/15/21 122/90   Pulse Readings from Last 3 Encounters:  09/13/21 90  07/25/21 94  07/15/21 89   Wt Readings from Last 3 Encounters:  10/16/21 150 lb (68 kg)  07/25/21 158 lb (71.7 kg)  07/15/21 150 lb (68 kg)   BMI Readings from Last 3 Encounters:  10/16/21 22.81 kg/m  09/13/21 24.02 kg/m  07/25/21 24.02 kg/m  Assessment/Interventions: Review of patient past medical history, allergies, medications, health status, including review of consultants reports, laboratory and other test data, was performed as part of comprehensive evaluation and provision of chronic care management services.   SDOH:  (Social Determinants of Health) assessments and interventions performed: No  SDOH Screenings   Alcohol Screen: Not on file  Depression (PHQ2-9): Low Risk    PHQ-2 Score: 0  Financial Resource Strain: Low Risk    Difficulty of Paying Living Expenses: Not hard at all  Food Insecurity: No Food Insecurity   Worried About Charity fundraiser in the Last Year: Never true   Ran Out of Food in the Last Year: Never true  Housing: Low Risk    Last Housing Risk Score: 0  Physical Activity: Inactive   Days of Exercise per Week: 0 days   Minutes of Exercise per Session: 0 min  Social Connections: Not on file  Stress: Stress Concern Present   Feeling of Stress : To some extent  Tobacco Use: Medium Risk   Smoking Tobacco Use: Former   Smokeless Tobacco Use: Never   Passive Exposure: Not on file  Transportation Needs: No Transportation Needs   Lack of Transportation (Medical): No   Lack of Transportation (Non-Medical): No    CCM Care Plan  Allergies  Allergen Reactions   Spinach Itching, Rash and Other (See Comments)    Welts.    Medications Reviewed Today     Reviewed by Dimitri Ped, RN (Registered Nurse) on 11/06/21 at 68  Med  List Status: <None>   Medication Order Taking? Sig Documenting Provider Last Dose Status Informant  Abatacept 125 MG/ML SOSY [id number] No Inject 125 mg into the skin once a week. Has on Sunday [provider] Taking Active Child           Med Note Juanetta Beets, [facility] R   Tue Aug 07, 2016  8:11 PM)    albuterol (VENTOLIN HFA) 108 [phone] Base) MCG/ACT inhaler [id number] No INHALE 1 PUFF INTO THE LUNGS EVERY 4 HOURS AS NEEDED FOR WHEEZING OR SHORTNESS OF Damian Leavell, MD Taking Active   allopurinol (ZYLOPRIM) 100 MG tablet [id number]  TAKE 1 TABLET BY MOUTH EVERY DAY [name], MD  Active   amLODipine (NORVASC) 5 MG tablet [id number]  Take 1 tablet (5 mg total) by mouth daily. [name], MD  Active   aspirin EC 81 MG tablet [id number] No Take 1 tablet (81 mg total) by mouth daily. Verlee Monte, MD Taking Active Child  CALCIUM PO [id number] No Take 1 tablet by mouth 2 (two) times daily. [provider] Taking Active Child  cetirizine (ZYRTEC) 10 MG tablet [id number] No Take 1 tablet (10 mg total) by mouth daily. [name], MD Taking Active Child  Cholecalciferol (VITAMIN D) 2000 units CAPS [id number] No Take 2,000 Units by mouth daily. [provider] Taking Active Child  ciprofloxacin-dexamethasone (CIPRODEX) OTIC suspension [id number] No Place 4 drops into both ears 2 (two) times daily. [name], MD Taking Active   clobetasol (TEMOVATE) 0.05 % external solution [id number] No Apply 1 application topically 2 (two) times daily. [name], MD Taking Active   clobetasol ointment (TEMOVATE) 0.05 % [id number] No Apply 1 application topically 2 (two) times daily. [name], MD Taking Active   denosumab (PROLIA) 60 MG/ML SOSY injection [id number] No Inject 60 mg into the skin every 6 (six) months. [provider] Taking Active Child  diazepam (VALIUM)  2 MG tablet [id number] No Take 1 tablet (2 mg total) by mouth every 12 (twelve) hours as needed for  anxiety. [name], MD Taking Active   diclofenac sodium (VOLTAREN) 1 % GEL [id number] No Apply 1 application topically 4 (four) times daily as needed (arthritis). [provider] Taking Active Child  docusate sodium (COLACE) 100 MG capsule [id number] No Take 100 mg by mouth 2 (two) times daily. [provider] Taking Active Child  esomeprazole (NEXIUM) 40 MG capsule [id number] No TAKE 1 CAPSULE(40 MG) BY MOUTH DAILY IN THE AFTERNOON [name], MD Taking Active   folic acid (FOLVITE) 585 MCG tablet [id number] No Take 400 mcg by mouth daily. [provider] Taking Active Child  HYDROcodone-acetaminophen (NORCO) 10-325 MG tablet [id number]  Take 1 tablet by mouth every 6 (six) hours as needed for moderate pain. [name], MD  Active   ketoconazole (NIZORAL) 2 % shampoo [id number] No Apply 1 application topically 2 (two) times a week. [provider] Taking Active Child  megestrol (MEGACE ES) 625 MG/5ML suspension [id number] No SHAKE LIQUID AND TAKE 5 ML BY MOUTH THREE TIMES DAILY BEFORE MEALS AS NEEDED FOR APPETITE [name], MD Taking Active   Methotrexate Sodium (METHOTREXATE, PF,) 50 MG/2ML injection [id number] No Inject 0.5 mLs into the muscle once a week. Sunday [provider] Taking Active Child           Med Note Vonzella Nipple, [place] S   Fri Jun 29, 2016  5:35 PM)    metoprolol succinate (TOPROL-XL) 25 MG 24 hr tablet [id number] No Take 1 tablet (25 mg total) by mouth daily. [name], MD Taking Active   Multiple Vitamin (MULTIVITAMIN WITH MINERALS) TABS tablet [id number] No Take 1 tablet by mouth daily. [provider] Taking Active Child  PERIDEX 0.12 % solution [id number] No Use as directed 15 mLs in the mouth or throat 2 (two) times daily.  [provider] Taking Active Child  polyethylene glycol (MIRALAX / GLYCOLAX) 17 g packet [id number] No Take 17 g by mouth daily as needed for mild constipation. [name], MD  Taking Active   tiZANidine (ZANAFLEX) 4 MG tablet [id number] No TAKE 1 TABLET(4 MG) BY MOUTH TWICE DAILY [name], MD Taking Active   traMADol (ULTRAM) 50 MG tablet [id number] No TAKE 1 TO 2 TABLETS(50 TO 100 MG) BY MOUTH EVERY 6 HOURS AS NEEDED [name], MD Taking Active   vitamin B-12 (CYANOCOBALAMIN) 500 MCG tablet [id number] No Take 500 mcg by mouth daily. [provider] Taking Active Child  vitamin C (ASCORBIC ACID) 500 MG tablet [id number] No Take 500 mg by mouth daily.  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   Chronic diastolic CHF (congestive heart failure) ([place])    Ventral hernia without obstruction or gangrene 06/06/2015   Renal mass    Pulmonary HTN ([place]) 01/01/2014   AKI (acute kidney injury) ([place]) 01/01/2014   Iron deficiency anemia 01/01/2014   Atrial flutter ([place]) 12/31/2013   Encounter for ostomy care education 11/30/2013   Stoma dermatitis 11/30/2013  Incisional hernia 11/30/2013   Constipation, chronic 11/30/2013   Charcot's joint of foot 07/07/2013   Bilateral leg edema 04/20/2013   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 11/28/2012 01/05/2013   Rheumatoid arthritis ([place]) 06/07/2009   THYROID NODULE 07/01/2007   LUNG NODULE 07/01/2007   Osteoporosis 05/07/2007   ABUSE, ALCOHOL, 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/12/2007   Blindness 04/12/2007   HEMORRHOIDS, INTERNAL 04/12/2007   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/14/2007   Hyperlipidemia 01/07/2007   Essential hypertension 01/07/2007   GERD 01/07/2007   Marfan's syndrome 01/07/2007   Stroke ([place]) 07/2006   Chronic gout  2008    Immunization History  Administered Date(s) Administered   DTaP 10/07/2005   Fluad Quad(high Dose 65+) 04/20/2019   Influenza Split 04/30/2011, 04/16/2012   Influenza Whole 04/25/2002, 04/03/2010   Influenza,inj,Quad PF,6+ Mos 03/23/2013, 05/09/2015, 04/15/2017, 04/21/2018, 06/20/2020   Influenza-Unspecified 04/26/2014, 04/24/2021   Pneumococcal Conjugate-13 07/18/2015   Pneumococcal Polysaccharide-23 04/25/2002, 04/03/2010   Tdap 09/04/2010   Patient feels like the metoprolol is messing with her sleep. She reports she already has crazy sleep habits. Patient reports this could be coming from the medication but she isn't positive. Patient doesn't feel like she is still having chest tightness when she wakes up.  Conditions to be addressed/monitored:  Hypertension, Hyperlipidemia, GERD, Osteoporosis, Gout and RA and pain  Conditions addressed this visit: Hypertension, hyperlipidemia  Care Plan : CCM Pharmacy Care Plan  Updates made by Viona Gilmore, [place] since 11/17/2021 12:00 AM     Problem: Problem: Hypertension, Hyperlipidemia, GERD, Osteoporosis, Gout and RA and pain      Long-Range Goal: Patient-Specific Goal   Start Date: 11/08/2020  Expected End Date: 11/08/2021  Recent Progress: On track  Priority: High  Note:   Current Barriers:  Unable to independently monitor therapeutic efficacy Unable to achieve control of cholesterol  Unable to maintain control of blood pressure  Pharmacist Clinical Goal(s):  Patient will achieve adherence to monitoring guidelines and medication adherence to achieve therapeutic efficacy achieve control of cholesterol as evidenced by next lipid panel  through collaboration with PharmD and provider.   Interventions: 1:1 collaboration with [name], MD regarding development and update of comprehensive plan of care as evidenced by provider attestation and co-signature Inter-disciplinary care team collaboration (see longitudinal plan  of care) Comprehensive medication review performed; medication list updated in electronic medical record  Hypertension (BP goal <130/80) -Controlled -Current treatment: Amlodipine 5 mg 1 tablet once daily - Appropriate, Query effective, Safe, Accessible Metoprolol succinate 25 mg 1 tablet daily - Appropriate, Query effective, Safe, Accessible -Medications previously tried: metoprolol (patient preference)  -Current home readings: recommended checking once BP; wrist BP cuff - 105/60s, 103/60s - 106/71; usually under 120/80 (wrist cuff) -Current dietary habits: did not discuss -Current exercise habits: limited with use of a wheelchair -Denies hypotensive/hypertensive symptoms -Educated on BP goals and benefits of medications for prevention of heart attack, stroke and kidney damage; Importance of home blood pressure monitoring; Proper BP monitoring technique; -Counseled to monitor BP at home weekly, document, and provide log at future appointments -Counseled on diet and exercise extensively Recommended to continue current medication  Hyperlipidemia: (LDL goal < 70) -Uncontrolled -Current treatment: No medications -Medications previously tried: simvastatin (myalgias)   -Current dietary patterns: uses oil when cooking; eating a lot of fried fast food due to kids brining her food and she cannot cook -Current exercise habits: limited -Educated on Cholesterol goals;  Benefits of statin for ASCVD risk reduction; Importance of limiting foods high in  cholesterol; -Recommended starting statin therapy based on history of TIA and LDL goal < 70.  History of TIA (Goal: prevent future events) -Uncontrolled -Current treatment  Aspirin 59m, 1 tablet once daily - Appropriate, Effective, Safe, Accessible -Medications previously tried: none  -Recommended to continue current medication   Osteoporosis (Goal prevent fractures) -Not ideally controlled -Last DEXA Scan: 10/05/19  T-Score femoral neck:  -1.9, -2.9  T-Score total hip: n/a  T-Score lumbar spine: n/a  T-Score forearm radius: -4.2  10-year probability of major osteoporotic fracture: n/a  10-year probability of hip fracture: n/a -Patient is a candidate for pharmacologic treatment due to T-Score < -2.5 in femoral neck -Current treatment  Prolia injection every 6 months (started 02/2020)  - Appropriate, Effective, Safe, Accessible Cholecalciferol (vitamin D) 2000 units, 1 capsule once daily - Appropriate, Effective, Safe, Accessible Calcium 1200 mg, 1 tablet twice daily - Appropriate, Effective, Safe, Accessible -Medications previously tried: none  -Recommend [phone] units of vitamin D daily. Recommend 1200 mg of calcium daily from dietary and supplemental sources. Recommend weight-bearing and muscle strengthening exercises for building and maintaining bone density. -Counseled on diet and exercise extensively Recommended to continue current medication Recommended reminding dentist about Prolia therapy.  Gout (Goal: prevent flare ups and uric acid < 6) -Controlled -Current treatment  Allopurinol 1072m 1 tablet once daily  - Appropriate, Effective, Safe, Accessible -Medications previously tried: none  -Recommended to continue current medication  GERD (Goal: minimize symptoms) -Controlled -Current treatment  Esomeprazole 4052m1 capsule once daily - Appropriate, Effective, Query Safe, Accessible -Medications previously tried: none  -Counseled on non-pharmacological interventions for acid reflux. Take measures to prevent acid reflux, such as avoiding spicy foods, avoiding caffeine, avoid laying down a few hours after eating, and raising the head of the bed.  Rheumatoid arthritis (Goal: minimize symptoms) -Controlled -Current treatment  Methotrexate injection, inject 0.5MLs into muscle every Monday - Appropriate, Effective, Safe, Accessible Folic acid 400998PJA tablet once daily - Appropriate, Effective, Safe,  Accessible Orencia 125m63mnject 125mg8mry Monday - Appropriate, Effective, Safe, Accessible -Medications previously tried: n/a  -Recommended to continue current medication  Pain (Goal: minimize pain) -Not ideally controlled -Current treatment  Hydrocodone/ APAP 10/325mg,31mablet every six hours as needed for moderate pain (will take if tramadol doesn't work) - Appropriate, Effective, Query Safe, Accessible Diclofenac 1% gel, apply topically four times daily as needed - Appropriate, Effective, Safe, Accessible Tramadol 50mg, 21m 2 tablets every six hours as needed  (takes at least 2x/ day) - Appropriate, Effective, Query Safe, Accessible Tizanidine 4mg, 1 37mlet twice daily - Appropriate, Effective, Safe, Accessible -Medications previously tried: n/a  -Recommended to continue current medication   Health Maintenance -Vaccine gaps: shingles, tetanus, COVID  -Current therapy:  Cetirizine 10mg, 1 52met once daily Docusate 100mg, 1 c31mle twice daily  keoconazole 2% cream, apply once daily for irritation Metoclopramide 10mg,1 tab57mevery six hours  Multivitamin tablets, 1 tablet once daily Miralax, 17 g daily as needed for constipation ( not everyday bc does not have someone to help change bag)  Triamcinolone 00025% cream, apply daily as needed for itching Vitamin B12 500mcg, 1 ta18m once daily Vitamin C 500mg, 1 tabl43mnce daily  -Educated on Cost vs benefit of each product must be carefully weighed by individual consumer -Patient is satisfied with current therapy and denies issues -Recommended to continue current medication  Patient Goals/Self-Care Activities Patient will:  - take medications as prescribed check blood pressure weekly, document, and provide at future appointments  target a minimum of 150 minutes of moderate intensity exercise weekly  Follow Up Plan: Telephone follow up appointment with care management team member scheduled for: 6 months      Medication  Assistance: None required.  Patient affirms current coverage meets needs.  Compliance/Adherence/Medication fill history: Care Gaps: Eye exam, foot exam, shingrix, tetanus, A1c, Prevnar 20, Hep C screening, urine microalbumin Last BP - 118/60 on 09/13/2021   Star-Rating Drugs: None  Patient's preferred pharmacy is:  [facility] [place], [address] [place] [address] Phone: [phone] Fax: [phone]  [facility] [phone] - [place], [place] - [address] [place] [address] Phone: [phone] Fax: [phone]  [facility] [place], [place] [address] [place] [address] Phone: [phone] Fax: [phone]   Uses pill box? Yes  Pt endorses 99% compliance  We discussed: Benefits of medication synchronization, packaging and delivery as well as enhanced pharmacist oversight with [employer]. Patient decided to: Continue current medication management strategy  Care Plan and Follow Up Patient Decision:  Patient agrees to Care Plan and Follow-up.  Plan: Telephone follow up appointment with care management team member scheduled for:  6 months  [name], PharmD [place] Pharmacist [facility] [place] [phone]

## 2021-11-04 DIAGNOSIS — Z933 Colostomy status: Secondary | ICD-10-CM | POA: Diagnosis not present

## 2021-11-06 ENCOUNTER — Ambulatory Visit: Payer: Medicare Other

## 2021-11-06 ENCOUNTER — Other Ambulatory Visit: Payer: Self-pay | Admitting: Family Medicine

## 2021-11-06 DIAGNOSIS — I10 Essential (primary) hypertension: Secondary | ICD-10-CM

## 2021-11-06 DIAGNOSIS — E785 Hyperlipidemia, unspecified: Secondary | ICD-10-CM

## 2021-11-06 DIAGNOSIS — M069 Rheumatoid arthritis, unspecified: Secondary | ICD-10-CM

## 2021-11-06 NOTE — Chronic Care Management (AMB) (Signed)
?Chronic Care Management  ? ?CCM RN Visit Note ? ?11/06/2021 ?Name: [name] MRN: [id number] DOB: 06/01/1956 ? ?Subjective: ?[name] is a 66 y.o. year old female who is a primary care patient of [name], MD. The care management team was consulted for assistance with disease management and care coordination needs.   ? ?Engaged with patient by telephone for follow up visit in response to provider referral for case management and/or care coordination services.  ? ?Consent to Services:  ?The patient was given information about Chronic Care Management services, agreed to services, and gave verbal consent prior to initiation of services.  Please see initial visit note for detailed documentation.  ? ?Patient agreed to services and verbal consent obtained.  ? ?Assessment: Review of patient past medical history, allergies, medications, health status, including review of consultants reports, laboratory and other test data, was performed as part of comprehensive evaluation and provision of chronic care management services.  ? ?SDOH (Social Determinants of Health) assessments and interventions performed:   ? ?CCM Care Plan ? ?Allergies  ?Allergen Reactions  ? Spinach Itching, Rash and Other (See Comments)  ?  Welts.  ? ? ?Outpatient Encounter Medications as of 11/06/2021  ?Medication Sig  ? Abatacept 125 MG/ML SOSY Inject 125 mg into the skin once a week. Has on Sunday  ? albuterol (VENTOLIN HFA) 108 (90 Base) MCG/ACT inhaler INHALE 1 PUFF INTO THE LUNGS EVERY 4 HOURS AS NEEDED FOR WHEEZING OR SHORTNESS OF BREATH  ? allopurinol (ZYLOPRIM) 100 MG tablet TAKE 1 TABLET BY MOUTH EVERY DAY  ? amLODipine (NORVASC) 5 MG tablet Take 1 tablet (5 mg total) by mouth daily.  ? aspirin EC 81 MG tablet Take 1 tablet (81 mg total) by mouth daily.  ? CALCIUM PO Take 1 tablet by mouth 2 (two) times daily.  ? cetirizine (ZYRTEC) 10 MG tablet Take 1 tablet (10 mg total) by mouth daily.  ? Cholecalciferol (VITAMIN D) 2000 units  CAPS Take 2,000 Units by mouth daily.  ? ciprofloxacin-dexamethasone (CIPRODEX) OTIC suspension Place 4 drops into both ears 2 (two) times daily.  ? clobetasol (TEMOVATE) 0.05 % external solution Apply 1 application topically 2 (two) times daily.  ? clobetasol ointment (TEMOVATE) 5.95 % Apply 1 application topically 2 (two) times daily.  ? denosumab (PROLIA) 60 MG/ML SOSY injection Inject 60 mg into the skin every 6 (six) months.  ? diazepam (VALIUM) 2 MG tablet Take 1 tablet (2 mg total) by mouth every 12 (twelve) hours as needed for anxiety.  ? diclofenac sodium (VOLTAREN) 1 % GEL Apply 1 application topically 4 (four) times daily as needed (arthritis).  ? docusate sodium (COLACE) 100 MG capsule Take 100 mg by mouth 2 (two) times daily.  ? esomeprazole (NEXIUM) 40 MG capsule TAKE 1 CAPSULE(40 MG) BY MOUTH DAILY IN THE AFTERNOON  ? folic acid (FOLVITE) 638 MCG tablet Take 400 mcg by mouth daily.  ? [START ON 12/30/2021] HYDROcodone-acetaminophen (NORCO) 10-325 MG tablet Take 1 tablet by mouth every 6 (six) hours as needed for moderate pain.  ? ketoconazole (NIZORAL) 2 % shampoo Apply 1 application topically 2 (two) times a week.  ? megestrol (MEGACE ES) 625 MG/5ML suspension SHAKE LIQUID AND TAKE 5 ML BY MOUTH THREE TIMES DAILY BEFORE MEALS AS NEEDED FOR APPETITE  ? Methotrexate Sodium (METHOTREXATE, PF,) 50 MG/2ML injection Inject 0.5 mLs into the muscle once a week. Sunday  ? metoprolol succinate (TOPROL-XL) 25 MG 24 hr tablet Take 1 tablet (25  mg total) by mouth daily.  ? Multiple Vitamin (MULTIVITAMIN WITH MINERALS) TABS tablet Take 1 tablet by mouth daily.  ? PERIDEX 0.12 % solution Use as directed 15 mLs in the mouth or throat 2 (two) times daily.   ? polyethylene glycol (MIRALAX / GLYCOLAX) 17 g packet Take 17 g by mouth daily as needed for mild constipation.  ? tiZANidine (ZANAFLEX) 4 MG tablet TAKE 1 TABLET(4 MG) BY MOUTH TWICE DAILY  ? traMADol (ULTRAM) 50 MG tablet TAKE 1 TO 2 TABLETS(50 TO 100 MG) BY  MOUTH EVERY 6 HOURS AS NEEDED  ? vitamin B-12 (CYANOCOBALAMIN) 500 MCG tablet Take 500 mcg by mouth daily.  ? vitamin C (ASCORBIC ACID) 500 MG tablet Take 500 mg by mouth daily.   ? [DISCONTINUED] allopurinol (ZYLOPRIM) 100 MG tablet TAKE 1 TABLET BY MOUTH EVERY DAY  ? ?No facility-administered encounter medications on file as of 11/06/2021.  ? ? ?Patient Active Problem List  ? Diagnosis Date Noted  ? Partial small bowel obstruction ([place]) 07/04/2020  ? COVID-19 virus infection 07/04/2020  ? Malnutrition of moderate degree 05/11/2020  ? SBO (small bowel obstruction) ([place]) 05/07/2020  ? Left renal mass 10/23/2017  ? Hx of adenomatous polyp of colon 11/13/2016  ? 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 Chronic diastolic CHF (congestive heart failure) ([place])   ? Ventral hernia without obstruction or gangrene 06/06/2015  ? Renal mass   ? Pulmonary HTN ([place]) 01/01/2014  ? AKI (acute kidney injury) ([place]) 01/01/2014  ? Iron deficiency anemia 01/01/2014  ? Atrial flutter ([place]) 12/31/2013  ? Encounter for ostomy care education 11/30/2013  ? Stoma dermatitis 11/30/2013  ? Incisional hernia 11/30/2013  ? Constipation, chronic 11/30/2013  ? Charcot's joint of foot 07/07/2013  ? Bilateral leg edema 04/20/2013  ? 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 Rheumatoid arthritis ([place]) 06/07/2009  ? THYROID NODULE 07/01/2007  ? LUNG NODULE 07/01/2007  ? Osteoporosis 05/07/2007  ? 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 Blindness 04/12/2007  ? HEMORRHOIDS, INTERNAL 04/12/2007  ? 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/14/2007  ? Hyperlipidemia 01/07/2007  ? Essential hypertension 01/07/2007  ? GERD 01/07/2007  ? Marfan's syndrome 01/07/2007  ? Stroke [facility]) 07/2006  ? Chronic gout 2008  ? ? ?Conditions to be addressed/monitored:HTN, HLD, Osteoporosis, and RA ? ?Care Plan : RN Care Manager Plan of Care  ?Updates made by Dimitri Ped, RN since 11/06/2021 12:00 AM  ?  ? ?Problem: Chronic Disease  Management and Care Coordination Needs (HTN, HLD, RA, Osteoporosis)   ?Priority: High  ?  ? ?Long-Range Goal: Establish Plan of Care for Chronic Disease Management Needs ((HTN, HLD, RA, Osteoporosis)   ?Start Date: 08/18/2021  ?Expected End Date: 08/18/2022  ?Priority: High  ?Note:   ?Current Barriers:  ?Knowledge Deficits related to plan of care for management of HTN, HLD, Osteoporosis, and RA, chronic pain  ?Care Coordination needs related to ADL IADL limitations, Inability to perform ADL's independently, Inability to perform IADL's independently, and blindness ?Chronic Disease Management support and education needs related to HTN, HLD, Osteoporosis, and RA, chronic pain   ?States she started some medication to help with her heart palpitations.    States her B/P has been lower with readings of 104/71 and 105/69.  Denies any dizziness.   States that her pain is good today and she is taking Tramadol twice a day as ordered.  States her bowels are moving with no constipation.  States she has an aide 4 hours  a day.  States she tries to eat fruits and vegetables but does not have much of an appetite.  States  she is drinking an Ensure every day. States her daughter fills her pill box weekly, changes her colostomy bag and  gives her injections.  States her daughter had surgery for breast cancer but  she will need help her with her colostomy. ? ?RNCM Clinical Goal(s):  ?Patient will verbalize understanding of plan for management of HTN, HLD, Osteoporosis, and RA, chronic pain  as evidenced by voiced adherence to plan of care ?verbalize basic understanding of  HTN, HLD, Osteoporosis, and RA, chronic pain  disease process and self health management plan as evidenced by voiced understanding and teach back ?take all medications exactly as prescribed and will call provider for medication related questions as evidenced by dispense report and pt verbalization  ?attend all scheduled medical appointments: Dr. Sarajane Jews 11/27/21 as evidenced  by medical records ?demonstrate Improved adherence to prescribed treatment plan for HTN, HLD, Osteoporosis, and RA, chronic pain  as evidenced by readings within limits, voiced adherence to plan of care

## 2021-11-06 NOTE — Patient Instructions (Signed)
Visit Information ? ?Thank you for taking time to visit with me today. Please don't hesitate to contact me if I can be of assistance to you before our next scheduled telephone appointment. ? ?Following are the goals we discussed today:  ?Take all medications as prescribed ?Attend all scheduled provider appointments ?Call pharmacy for medication refills 3-7 days in advance of running out of medications ?Call provider office for new concerns or questions  ?check blood pressure weekly ?keep a blood pressure log ?take blood pressure log to all doctor appointments ?call doctor for signs and symptoms of high blood pressure ?keep all doctor appointments ?take medications for blood pressure exactly as prescribed ?eat more whole grains, fruits and vegetables, lean meats and healthy fats ?limit salt intake to '2300mg'$ /day ?call for medicine refill 2 or 3 days before it runs out ?take all medications exactly as prescribed ?call doctor with any symptoms you believe are related to your medicine ? ?Our next appointment is by telephone on 01/08/22 at 2:15 PM ? ?Please call the care guide team at [phone] if you need to cancel or reschedule your appointment.  ? ?If you are experiencing a Mental Health or [place] or need someone to talk to, please call the Suicide and Crisis Lifeline: 988 ?call the [place] National Suicide Prevention Lifeline: 778-753-4329 or TTY: (734)675-2689 TTY 620-610-8686) to talk to a trained counselor ?call 1-800-273-TALK (toll free, 24 hour hotline) ?go to [facility] [address], [place] [phone]) ?call 911  ? ?The patient verbalized understanding of instructions, educational materials, and care plan provided today and agreed to receive a mailed copy of patient instructions, educational materials, and care plan.  ? ?Peter Garter RN, BSN,CCM, CDE ?Care Management Coordinator ?[facility] ?[phone]   ?

## 2021-11-13 DIAGNOSIS — M0549 Rheumatoid myopathy with rheumatoid arthritis of multiple sites: Secondary | ICD-10-CM | POA: Diagnosis not present

## 2021-11-13 DIAGNOSIS — R5381 Other malaise: Secondary | ICD-10-CM | POA: Diagnosis not present

## 2021-11-13 DIAGNOSIS — M069 Rheumatoid arthritis, unspecified: Secondary | ICD-10-CM | POA: Diagnosis not present

## 2021-11-13 DIAGNOSIS — K5720 Diverticulitis of large intestine with perforation and abscess without bleeding: Secondary | ICD-10-CM | POA: Diagnosis not present

## 2021-11-22 DIAGNOSIS — E785 Hyperlipidemia, unspecified: Secondary | ICD-10-CM | POA: Diagnosis not present

## 2021-11-22 DIAGNOSIS — Z87891 Personal history of nicotine dependence: Secondary | ICD-10-CM | POA: Diagnosis not present

## 2021-11-22 DIAGNOSIS — I10 Essential (primary) hypertension: Secondary | ICD-10-CM

## 2021-11-22 DIAGNOSIS — M069 Rheumatoid arthritis, unspecified: Secondary | ICD-10-CM

## 2021-11-24 ENCOUNTER — Other Ambulatory Visit: Payer: Self-pay

## 2021-11-27 ENCOUNTER — Ambulatory Visit: Payer: Medicare Other | Admitting: Family Medicine

## 2021-12-04 ENCOUNTER — Other Ambulatory Visit: Payer: Self-pay

## 2021-12-04 ENCOUNTER — Other Ambulatory Visit: Payer: Self-pay | Admitting: Family Medicine

## 2021-12-04 DIAGNOSIS — Z933 Colostomy status: Secondary | ICD-10-CM | POA: Diagnosis not present

## 2021-12-04 DIAGNOSIS — I10 Essential (primary) hypertension: Secondary | ICD-10-CM

## 2021-12-04 MED ORDER — AMLODIPINE BESYLATE 5 MG PO TABS: 5.0000 mg | ORAL_TABLET | Freq: Every day | ORAL | 1 refills | Status: DC

## 2021-12-05 ENCOUNTER — Telehealth: Payer: Self-pay | Admitting: Family Medicine

## 2021-12-05 DIAGNOSIS — E559 Vitamin D deficiency, unspecified: Secondary | ICD-10-CM | POA: Insufficient documentation

## 2021-12-05 DIAGNOSIS — M810 Age-related osteoporosis without current pathological fracture: Secondary | ICD-10-CM | POA: Diagnosis not present

## 2021-12-05 DIAGNOSIS — E041 Nontoxic single thyroid nodule: Secondary | ICD-10-CM | POA: Diagnosis not present

## 2021-12-05 NOTE — Telephone Encounter (Signed)
Pt requesting a refill of tiZANidine (ZANAFLEX) 4 MG tablet   [facility] [phone] - [place], [address] AT [place] Phone:  [phone]  Fax:  [phone]

## 2021-12-05 NOTE — Telephone Encounter (Signed)
Pt LOV was on 10/20/221 Last refill done on 05/24/2021 Please advise

## 2021-12-06 ENCOUNTER — Other Ambulatory Visit: Payer: Self-pay

## 2021-12-06 MED ORDER — TIZANIDINE HCL 4 MG PO TABS
ORAL_TABLET | ORAL | 0 refills | Status: DC
Start: 2021-12-06 — End: 2022-01-19

## 2021-12-06 NOTE — Telephone Encounter (Signed)
Rx refill sent to pt pharmacy 

## 2021-12-06 NOTE — Telephone Encounter (Signed)
Pt calling to check on progress of refill

## 2021-12-08 DIAGNOSIS — M0549 Rheumatoid myopathy with rheumatoid arthritis of multiple sites: Secondary | ICD-10-CM | POA: Diagnosis not present

## 2021-12-08 DIAGNOSIS — K5720 Diverticulitis of large intestine with perforation and abscess without bleeding: Secondary | ICD-10-CM | POA: Diagnosis not present

## 2021-12-08 DIAGNOSIS — M069 Rheumatoid arthritis, unspecified: Secondary | ICD-10-CM | POA: Diagnosis not present

## 2021-12-08 DIAGNOSIS — R5381 Other malaise: Secondary | ICD-10-CM | POA: Diagnosis not present

## 2021-12-11 DIAGNOSIS — H7292 Unspecified perforation of tympanic membrane, left ear: Secondary | ICD-10-CM | POA: Diagnosis not present

## 2021-12-11 DIAGNOSIS — Z974 Presence of external hearing-aid: Secondary | ICD-10-CM | POA: Diagnosis not present

## 2021-12-11 DIAGNOSIS — H938X3 Other specified disorders of ear, bilateral: Secondary | ICD-10-CM | POA: Diagnosis not present

## 2021-12-18 ENCOUNTER — Ambulatory Visit
Admission: RE | Admit: 2021-12-18 | Discharge: 2021-12-18 | Disposition: A | Discharge status: Home / Self Care | Payer: Medicare Other | Source: Ambulatory Visit | Attending: Urology | Admitting: Urology

## 2021-12-18 DIAGNOSIS — N2889 Other specified disorders of kidney and ureter: Secondary | ICD-10-CM

## 2021-12-18 DIAGNOSIS — M6208 Separation of muscle (nontraumatic), other site: Secondary | ICD-10-CM | POA: Diagnosis not present

## 2021-12-18 DIAGNOSIS — K5730 Diverticulosis of large intestine without perforation or abscess without bleeding: Secondary | ICD-10-CM | POA: Diagnosis not present

## 2021-12-18 MED ORDER — IOPAMIDOL (ISOVUE-370) INJECTION 76%
80.0000 mL | Freq: Once | INTRAVENOUS | Status: AC | PRN
  Administered 2021-12-18: 80 mL via INTRAVENOUS

## 2021-12-19 ENCOUNTER — Ambulatory Visit: Payer: Self-pay

## 2021-12-19 DIAGNOSIS — E785 Hyperlipidemia, unspecified: Secondary | ICD-10-CM

## 2021-12-19 DIAGNOSIS — I10 Essential (primary) hypertension: Secondary | ICD-10-CM

## 2021-12-19 NOTE — Chronic Care Management (AMB) (Signed)
Chronic Care Management   CCM RN Visit Note  12/19/2021 Name: [name] MRN: [id number] DOB: 1955/08/02  Subjective: [name] is a 66 y.o. year old female who is a primary care patient of [name], MD. The care management team was consulted for assistance with disease management and care coordination needs.    Engaged with patient by telephone for follow up visit in response to provider referral for case management and/or care coordination services.   Consent to Services:  The patient was given information about Chronic Care Management services, agreed to services, and gave verbal consent prior to initiation of services.  Please see initial visit note for detailed documentation.   Patient agreed to services and verbal consent obtained.   Assessment: Review of patient past medical history, allergies, medications, health status, including review of consultants reports, laboratory and other test data, was performed as part of comprehensive evaluation and provision of chronic care management services.   SDOH (Social Determinants of Health) assessments and interventions performed:    CCM Care Plan  Allergies  Allergen Reactions   Spinach Itching, Rash and Other (See Comments)    Welts.    Outpatient Encounter Medications as of 12/19/2021  Medication Sig   Abatacept 125 MG/ML SOSY Inject 125 mg into the skin once a week. Has on Sunday   albuterol (VENTOLIN HFA) 108 (90 Base) MCG/ACT inhaler INHALE 1 PUFF INTO THE LUNGS EVERY 4 HOURS AS NEEDED FOR WHEEZING OR SHORTNESS OF BREATH   allopurinol (ZYLOPRIM) 100 MG tablet TAKE 1 TABLET BY MOUTH EVERY DAY   amLODipine (NORVASC) 5 MG tablet Take 1 tablet (5 mg total) by mouth daily.   aspirin EC 81 MG tablet Take 1 tablet (81 mg total) by mouth daily.   CALCIUM PO Take 1 tablet by mouth 2 (two) times daily.   cetirizine (ZYRTEC) 10 MG tablet Take 1 tablet (10 mg total) by mouth daily.   Cholecalciferol (VITAMIN D) 2000 units  CAPS Take 2,000 Units by mouth daily.   ciprofloxacin-dexamethasone (CIPRODEX) OTIC suspension Place 4 drops into both ears 2 (two) times daily.   clobetasol (TEMOVATE) 0.05 % external solution Apply 1 application topically 2 (two) times daily.   clobetasol ointment (TEMOVATE) 9.67 % Apply 1 application topically 2 (two) times daily.   denosumab (PROLIA) 60 MG/ML SOSY injection Inject 60 mg into the skin every 6 (six) months.   diazepam (VALIUM) 2 MG tablet Take 1 tablet (2 mg total) by mouth every 12 (twelve) hours as needed for anxiety.   diclofenac sodium (VOLTAREN) 1 % GEL Apply 1 application topically 4 (four) times daily as needed (arthritis).   docusate sodium (COLACE) 100 MG capsule Take 100 mg by mouth 2 (two) times daily.   esomeprazole (NEXIUM) 40 MG capsule TAKE 1 CAPSULE(40 MG) BY MOUTH DAILY IN THE AFTERNOON   folic acid (FOLVITE) 591 MCG tablet Take 400 mcg by mouth daily.   [START ON 12/30/2021] HYDROcodone-acetaminophen (NORCO) 10-325 MG tablet Take 1 tablet by mouth every 6 (six) hours as needed for moderate pain.   ketoconazole (NIZORAL) 2 % shampoo Apply 1 application topically 2 (two) times a week.   megestrol (MEGACE ES) 625 MG/5ML suspension SHAKE LIQUID AND TAKE 5 ML BY MOUTH THREE TIMES DAILY BEFORE MEALS AS NEEDED FOR APPETITE   Methotrexate Sodium (METHOTREXATE, PF,) 50 MG/2ML injection Inject 0.5 mLs into the muscle once a week. Sunday   metoprolol succinate (TOPROL-XL) 25 MG 24 hr tablet Take 1 tablet (25  mg total) by mouth daily.   Multiple Vitamin (MULTIVITAMIN WITH MINERALS) TABS tablet Take 1 tablet by mouth daily.   PERIDEX 0.12 % solution Use as directed 15 mLs in the mouth or throat 2 (two) times daily.    polyethylene glycol (MIRALAX / GLYCOLAX) 17 g packet Take 17 g by mouth daily as needed for mild constipation.   tiZANidine (ZANAFLEX) 4 MG tablet TAKE 1 TABLET(4 MG) BY MOUTH TWICE DAILY   traMADol (ULTRAM) 50 MG tablet TAKE 1 TO 2 TABLETS(50 TO 100 MG) BY  MOUTH EVERY 6 HOURS AS NEEDED   vitamin B-12 (CYANOCOBALAMIN) 500 MCG tablet Take 500 mcg by mouth daily.   vitamin C (ASCORBIC ACID) 500 MG tablet Take 500 mg by mouth daily.    No facility-administered encounter medications on file as of 12/19/2021.    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   Chronic diastolic CHF (congestive heart failure) ([place])    Ventral hernia without obstruction or gangrene 06/06/2015   Renal mass    Pulmonary HTN ([place]) 01/01/2014   AKI (acute kidney injury) ([place]) 01/01/2014   Iron deficiency anemia 01/01/2014   Atrial flutter ([place]) 12/31/2013   Encounter for ostomy care education 11/30/2013   Stoma dermatitis 11/30/2013   Incisional hernia 11/30/2013   Constipation, chronic 11/30/2013   Charcot's joint of foot 07/07/2013   Bilateral leg edema 04/20/2013   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 11/28/2012 01/05/2013   Rheumatoid arthritis ([place]) 06/07/2009   THYROID NODULE 07/01/2007   LUNG NODULE 07/01/2007   Osteoporosis 05/07/2007   ABUSE, ALCOHOL, 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/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   Blindness 04/12/2007   HEMORRHOIDS, INTERNAL 04/12/2007   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/14/2007   Hyperlipidemia 01/07/2007   Essential hypertension 01/07/2007   GERD 01/07/2007   Marfan's syndrome 01/07/2007   Stroke ([place]) 07/2006   Chronic gout 2008    Conditions to be addressed/monitored:HTN and HLD  Care Plan : RN Care Manager Plan of Care  Updates made by Dimitri Ped, RN since 12/19/2021 12:00 AM  Completed 12/19/2021   Problem: Chronic Disease Management and Care Coordination Needs (HTN, HLD, RA, Osteoporosis) Resolved 12/19/2021   Priority: High     Long-Range Goal: Establish Plan of Care for Chronic Disease Management Needs ((HTN, HLD, RA, Osteoporosis) Completed 12/19/2021  Start Date: 08/18/2021  Expected End Date: 08/18/2022  Priority: High  Note:   RNCM case closed goals met Current Barriers:  Knowledge Deficits related to plan of care for management of HTN, HLD, Osteoporosis, and RA, chronic pain  Care Coordination needs related to ADL IADL limitations, Inability to perform ADL's independently, Inability to perform IADL's independently, and blindness Chronic Disease Management support and education needs related to HTN, HLD, Osteoporosis, and RA, chronic pain   States she started some medication to help with her heart palpitations.    States her B/P has been lower with readings of 104/71 and 105/69.  Denies any dizziness.   States that her pain is good today and she is taking Tramadol twice a day as ordered.  States her bowels are moving with no constipation.  States she has an aide 4 hours a day.  States she tries to eat fruits  and vegetables but does not have much of an appetite.  States  she is drinking an Ensure every day. States her daughter fills her pill box weekly, changes her colostomy bag and  gives her injections.  States her daughter had surgery for breast cancer but  she will need help her with her colostomy.  RNCM Clinical Goal(s):  Patient will verbalize understanding of plan for management of HTN, HLD, Osteoporosis, and RA, chronic pain  as evidenced by voiced adherence to plan of care verbalize basic understanding of  HTN, HLD, Osteoporosis, and RA, chronic pain  disease process and self health management plan as evidenced by voiced understanding and teach back take all medications exactly as prescribed and will call provider for medication related questions as evidenced by dispense report and pt verbalization  attend all scheduled medical appointments: Dr. Sarajane Jews 11/27/21 as evidenced by medical  records demonstrate Improved adherence to prescribed treatment plan for HTN, HLD, Osteoporosis, and RA, chronic pain  as evidenced by readings within limits, voiced adherence to plan of care continue to work with RN Care Manager to address care management and care coordination needs related to  HTN, HLD, Osteoporosis, and RA, chronic pain  as evidenced by adherence to CM Team Scheduled appointments work with pharmacist to address ADL IADL limitations, Inability to perform ADL's independently, Inability to perform IADL's independently, and blindness related toHTN, HLD, Osteoporosis, and RA, chronic pain  as evidenced by review or EMR and patient or pharmacist report through collaboration with RN Care manager, provider, and care team.   Interventions: 1:1 collaboration with primary care provider regarding development and update of comprehensive plan of care as evidenced by provider attestation and co-signature Inter-disciplinary care team collaboration (see longitudinal plan of care) Evaluation of current treatment plan related to  self management and patient's adherence to plan as established by provider   Rheumatoid Arthritis   (Status:  Goal Met.)  Long Term Goal Evaluation of current treatment plan related to  Rheumatoid Arthritis  , ADL IADL limitations, Inability to perform ADL's independently, and Inability to perform IADL's independently self-management and patient's adherence to plan as established by provider. Discussed plans with patient for ongoing care management follow up and provided patient with direct contact information for care management team Evaluation of current treatment plan related to Rheumatoid Arthritis  and patient's adherence to plan as established by provider Provided education to patient re: Rheumatoid Arthritis  Pharmacy referral for -working with PharmD Reviewed to keep appointments with rheumatology   Hyperlipidemia Interventions:  (Status:  Goal Met.) Long Term  Goal Medication review performed; medication list updated in electronic medical record.  Provider established cholesterol goals reviewed Counseled on importance of regular laboratory monitoring as prescribed Reviewed role and benefits of statin for ASCVD risk reduction Reviewed importance of limiting foods high in cholesterol Encouraged to increase activity as tolerated  Hypertension Interventions:  (Status:  Goal Met.) Long Term Goal Last practice recorded BP readings:  BP Readings from Last 3 Encounters:  09/13/21 118/60  07/25/21 138/80  07/15/21 122/90  Most recent eGFR/CrCl: No results found for: EGFR  No components found for: CRCL  Evaluation of current treatment plan related to hypertension self management and patient's adherence to plan as established by provider Provided education to patient re: stroke prevention, s/s of heart attack and stroke Reviewed medications with patient and discussed importance of compliance Advised patient, providing education and rationale, to monitor blood pressure daily and record, calling PCP for findings outside established parameters Provided  education on prescribed diet low sodium Reviewed to notify doctor if her B/P is lower and if she has symptoms of hypotension   Pain Interventions:  (Status:  Goal Met.) Long Term Goal Pain assessment performed Medications reviewed Reviewed provider established plan for pain management Discussed importance of adherence to all scheduled medical appointments Counseled on the importance of reporting any/all new or changed pain symptoms or management strategies to pain management provider Advised patient to report to care team affect of pain on daily activities Discussed use of relaxation techniques and/or diversional activities to assist with pain reduction (distraction, imagery, relaxation, massage, acupressure, TENS, heat, and cold application Reviewed with patient prescribed pharmacological and  nonpharmacological pain relief strategies  Patient Goals/Self-Care Activities: Take all medications as prescribed Attend all scheduled provider appointments Call pharmacy for medication refills 3-7 days in advance of running out of medications Call provider office for new concerns or questions  check blood pressure weekly keep a blood pressure log take blood pressure log to all doctor appointments call doctor for signs and symptoms of high blood pressure keep all doctor appointments take medications for blood pressure exactly as prescribed eat more whole grains, fruits and vegetables, lean meats and healthy fats limit salt intake to 2334m/day call for medicine refill 2 or 3 days before it runs out take all medications exactly as prescribed call doctor with any symptoms you believe are related to your medicine  Follow Up Plan:  The patient has been provided with contact information for the care management team and has been advised to call with any health related questions or concerns.  No further follow up required: RNCM case closed goals met       Plan:The patient has been provided with contact information for the care management team and has been advised to call with any health related questions or concerns.  No further follow up required: RNCM case closed goals met MPeter GarterRN, BPark[facility] CDE Care Management Coordinator [facility] [phone]

## 2021-12-20 ENCOUNTER — Encounter: Payer: Self-pay | Admitting: *Deleted

## 2021-12-20 ENCOUNTER — Ambulatory Visit
Admission: RE | Admit: 2021-12-20 | Discharge: 2021-12-20 | Disposition: A | Discharge status: Home / Self Care | Payer: Medicare Other | Source: Ambulatory Visit | Attending: Interventional Radiology | Admitting: Interventional Radiology

## 2021-12-20 DIAGNOSIS — N2889 Other specified disorders of kidney and ureter: Secondary | ICD-10-CM | POA: Diagnosis not present

## 2021-12-20 DIAGNOSIS — Z9889 Other specified postprocedural states: Secondary | ICD-10-CM | POA: Diagnosis not present

## 2021-12-20 HISTORY — PX: IR RADIOLOGIST EVAL & MGMT: IMG5224

## 2021-12-20 NOTE — Progress Notes (Signed)
Chief Complaint: Patient was consulted remotely today (TeleHealth) for follow up after cryoablation of a left renal carcinoma.  History of Present Illness: [name] is a 66 y.o. 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  She is asymptomatic.  Past Medical History:  Diagnosis Date   Atrial flutter ([place])    CHF (congestive heart failure) ([place])    Chronic gout 2008   Diverticulitis of large intestine with perforation    Dysrhythmia    Fibroid    Hearing aid worn    pt wears bilateral hearing aids   Hernia 4/08   Hx of adenomatous polyp of colon 11/13/2016   Hypertension    Legally blind    since pt was a teenager   Marfan's syndrome affecting skin    with scolosis   Osteoporosis    papillary renal cell ca 10/23/2017   Rheumatoid arthritis ([place])    sees Dr. 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   bilateral     Past Surgical History:  Procedure Laterality Date   ABLATION  01/04/14   atrial flutter ablation (2 circuits) by Dr Lovena Le   ATRIAL FLUTTER ABLATION N/A 01/04/2014   Procedure: ATRIAL FLUTTER ABLATION;  Surgeon: Evans Lance, MD;  Location: [facility] CATH LAB;  Service: Cardiovascular;  Laterality: N/A;   BUNIONECTOMY Bilateral 1/10   COLON SURGERY     colectomy   COLOSTOMY  11/27/2012   HERNIA REPAIR     IR RADIOLOGIST EVAL & MGMT  10/01/2017   IR RADIOLOGIST EVAL & MGMT  11/21/2017   IR RADIOLOGIST EVAL & MGMT  02/13/2018   IR RADIOLOGIST EVAL & MGMT  01/08/2019   IR RADIOLOGIST EVAL & MGMT  12/29/2019   IR RADIOLOGIST EVAL & MGMT  01/31/2021   LAPAROSCOPIC ABDOMINAL EXPLORATION N/A 01/06/2013   Procedure: LAPAROSCOPIC ABDOMINAL EXPLORATION;  Surgeon: Adin Hector, MD;  Location: [facility] ORS;   Service: General;  Laterality: N/A;   LAPAROSCOPIC LYSIS OF ADHESIONS N/A 01/06/2013   Procedure: LAPAROSCOPIC LYSIS OF ADHESIONS/ INTEROTOMY REPAIR;  Surgeon: Adin Hector, MD;  Location: [facility] ORS;  Service: General;  Laterality: N/A;   LAPAROTOMY N/A 11/27/2012   Procedure: EXPLORATORY LAPAROTOMY SIGMOID COLECTOMY, COLOSTOMY;  Surgeon: Madilyn Hook, DO;  Location: [facility] ORS;  Service: General;  Laterality: N/A;   RADIOLOGY WITH ANESTHESIA Left 10/23/2017   Procedure: CT RENAL CRYO AND BIOPSY;  Surgeon: Aletta Edouard, MD;  Location: [facility] ORS;  Service: Radiology;  Laterality: Left;   TOTAL KNEE ARTHROPLASTY Bilateral 6/08    Allergies: Spinach  Medications: Prior to Admission medications   Medication Sig Start Date End Date Taking? Authorizing Provider  Abatacept 125 MG/ML SOSY Inject 125 mg into the skin once a week. Has on Sunday    [provider]  albuterol (VENTOLIN HFA) 108 (90 Base) MCG/ACT inhaler INHALE 1 PUFF INTO THE LUNGS EVERY 4 HOURS AS NEEDED FOR WHEEZING OR SHORTNESS OF BREATH 11/11/20   [name], MD  allopurinol (ZYLOPRIM) 100 MG tablet TAKE 1 TABLET BY MOUTH EVERY DAY 11/06/21   [name], MD  amLODipine (NORVASC) 5 MG tablet Take 1 tablet (5 mg total) by mouth daily.  12/04/21   [name], MD  aspirin EC 81 MG tablet Take 1 tablet (81 mg total) by mouth daily. 05/11/14   Verlee Monte, MD  CALCIUM PO Take 1 tablet by mouth 2 (two) times daily.    [provider]  cetirizine (ZYRTEC) 10 MG tablet Take 1 tablet (10 mg total) by mouth daily. 11/17/14   [name], MD  Cholecalciferol (VITAMIN D) 2000 units CAPS Take 2,000 Units by mouth daily.    [provider]  ciprofloxacin-dexamethasone (CIPRODEX) OTIC suspension Place 4 drops into both ears 2 (two) times daily. 10/17/21   [name], MD  clobetasol (TEMOVATE) 0.05 % external solution Apply 1 application topically 2 (two) times daily. 07/25/21   [name], MD  clobetasol  ointment (TEMOVATE) 4.25 % Apply 1 application topically 2 (two) times daily. 07/25/21   [name], MD  denosumab (PROLIA) 60 MG/ML SOSY injection Inject 60 mg into the skin every 6 (six) months.    [provider]  diazepam (VALIUM) 2 MG tablet Take 1 tablet (2 mg total) by mouth every 12 (twelve) hours as needed for anxiety. 07/22/20   [name], MD  diclofenac sodium (VOLTAREN) 1 % GEL Apply 1 application topically 4 (four) times daily as needed (arthritis).    [provider]  docusate sodium (COLACE) 100 MG capsule Take 100 mg by mouth 2 (two) times daily.    [provider]  esomeprazole (NEXIUM) 40 MG capsule TAKE 1 CAPSULE(40 MG) BY MOUTH DAILY IN THE AFTERNOON 05/24/21   [name], MD  folic acid (FOLVITE) 956 MCG tablet Take 400 mcg by mouth daily.    [provider]  HYDROcodone-acetaminophen (NORCO) 10-325 MG tablet Take 1 tablet by mouth every 6 (six) hours as needed for moderate pain. 12/30/21 01/29/22  [name], MD  ketoconazole (NIZORAL) 2 % shampoo Apply 1 application topically 2 (two) times a week. 03/04/20   [provider]  megestrol (MEGACE ES) 625 MG/5ML suspension SHAKE LIQUID AND TAKE 5 ML BY MOUTH THREE TIMES DAILY BEFORE MEALS AS NEEDED FOR APPETITE 10/03/21   [name], MD  Methotrexate Sodium (METHOTREXATE, PF,) 50 MG/2ML injection Inject 0.5 mLs into the muscle once a week. Sunday 05/14/16   [provider]  metoprolol succinate (TOPROL-XL) 25 MG 24 hr tablet Take 1 tablet (25 mg total) by mouth daily. 10/20/21   [name], MD  Multiple Vitamin (MULTIVITAMIN WITH MINERALS) TABS tablet Take 1 tablet by mouth daily.    [provider]  PERIDEX 0.12 % solution Use as directed 15 mLs in the mouth or throat 2 (two) times daily.  03/23/20   [provider]  polyethylene glycol (MIRALAX / GLYCOLAX) 17 g packet Take 17 g by mouth daily as needed for mild constipation. 05/05/21   [name], MD  tiZANidine (ZANAFLEX) 4 MG tablet TAKE 1 TABLET(4 MG) BY MOUTH TWICE DAILY 12/06/21   [name], MD  traMADol (ULTRAM) 50 MG tablet TAKE 1 TO 2 TABLETS(50 TO 100 MG) BY MOUTH EVERY 6 HOURS AS NEEDED 09/21/21   [name], MD  vitamin B-12 (CYANOCOBALAMIN) 500 MCG tablet Take 500 mcg by mouth daily.    [provider]  vitamin C (ASCORBIC ACID) 500 MG tablet Take 500 mg by mouth daily.     [provider]     Family History  Problem Relation Age of Onset   Heart attack  Neg Hx    Colon cancer Neg Hx    Osteoporosis Neg Hx     Social History   Socioeconomic History   Marital status: Single    Spouse name: Not on file   Number of children: 1   Years of education: Not on file   Highest education level: Not on file  Occupational History   Not on file  Tobacco Use   Smoking status: Former    Packs/day: 1.00    Types: Cigarettes    Quit date: 11/27/2012    Years since quitting: 9.0   Smokeless tobacco: Never  Vaping Use   Vaping Use: Former  Substance and Sexual Activity   Alcohol use: No    Alcohol/week: 0.0 standard drinks of alcohol    Comment: recovering alcoholic sober since 9833   Drug use: Yes    Types: Hydrocodone   Sexual activity: Not Currently    Partners: Male    Birth control/protection: None  Other Topics Concern   Not on file  Social History Narrative   Not on file   Social Determinants of Health   Financial Resource Strain: Low Risk  (10/16/2021)   Overall Financial Resource Strain (CARDIA)    Difficulty of Paying Living Expenses: Not hard at all  Food Insecurity: No Food Insecurity (10/16/2021)   Hunger Vital Sign    Worried About Running Out of Food in the Last Year: Never true    [place] in the Last Year: Never true  Transportation Needs: No Transportation Needs (10/16/2021)   PRAPARE - Hydrologist (Medical): No    Lack of Transportation (Non-Medical): No  Physical Activity:  Inactive (10/16/2021)   Exercise Vital Sign    Days of Exercise per Week: 0 days    Minutes of Exercise per Session: 0 min  Stress: Stress Concern Present (10/16/2021)   [place]    Feeling of Stress : To some extent  Social Connections: Unknown (10/05/2020)   Social Connection and Isolation Panel [NHANES]    Frequency of Communication with Friends and Family: Not on file    Frequency of Social Gatherings with Friends and Family: Once a week    Attends Religious Services: Never    Marine scientist or Organizations: No    Attends Music therapist: Never    Marital Status: Never married    ECOG Status: 0 - Asymptomatic  Review of Systems  Constitutional: Negative.   Respiratory: Negative.    Cardiovascular: Negative.   Gastrointestinal: Negative.   Genitourinary: Negative.   Musculoskeletal: Negative.   Neurological: Negative.     Review of Systems: A 12 point ROS discussed and pertinent positives are indicated in the HPI above.  All other systems are negative.   Physical Exam No direct physical exam was performed (except for noted visual exam findings with Video Visits).   Vital Signs: LMP 06/25/2006 (LMP Unknown)   Imaging: CT ABDOMEN W WO CONTRAST  Result Date: 12/19/2021 CLINICAL DATA:  Left renal mass, prior cryoablation EXAM: CT ABDOMEN WITHOUT AND WITH CONTRAST TECHNIQUE: Multidetector CT imaging of the abdomen was performed following the standard protocol before and following the bolus administration of intravenous contrast. RADIATION DOSE REDUCTION: This exam was performed according to the departmental dose-optimization program which includes automated exposure control, adjustment of the mA and/or kV according to patient size and/or use of iterative reconstruction technique. CONTRAST:  29m ISOVUE-370 IOPAMIDOL (  ISOVUE-370) INJECTION 76% Creatinine was obtained on site at [facility] at [address] Results: Creatinine 0.8 mg/dL. COMPARISON:  07/10/2021 FINDINGS: Lower chest: Stable 4 by 2 mm subpleural nodule in the right middle lobe on image 22 series 8, similar back through 06/19/2017, benign. Bilateral airway thickening and airway plugging most notable in the left lower lobe. Aortic valve calcification. Descending thoracic aortic atherosclerotic calcification. Hepatobiliary: Benign cysts in the left hepatic lobe. Gallbladder unremarkable. Pancreas: Chronic borderline dilatation of the dorsal pancreatic duct. Spleen: Unremarkable Adrenals/Urinary Tract: The adrenal glands appear unremarkable. Mild right renal scarring. Scattered scarring in the left kidney. Stable left kidney mid upper ablation site, without recurrence. Adjacent calyceal diverticulum noted. No current worrisome lesions identified. Stomach/Bowel: Left upper quadrant colostomy. Colonic diverticulosis. Lower abdominal rectus diastasis with resulting broad herniation containing colon and small bowel, only partially included on today's exam. Vascular/Lymphatic: Atherosclerosis is present, including aortoiliac atherosclerotic disease. There is atherosclerotic plaque proximally in the superior mesenteric artery and proximally in both renal arteries. No vascular occlusion. No aneurysm identified. Other: No supplemental non-categorized findings. Musculoskeletal: Markedly severe levoconvex lower thoracic and lumbar scoliosis with marked rotary component. Substantial intervertebral spurring. IMPRESSION: 1. No findings of recurrent malignancy. Cryoablation site in the left kidney. Scattered bilateral renal scarring. 2. Airway thickening is present, suggesting bronchitis or reactive airways disease. Airway plugging in the lower lobes, left greater than right. 3. Other imaging findings of potential clinical significance: Aortic valve calcification. Aortic Atherosclerosis (ICD10-I70.0). Scattered colonic diverticulosis.  Rectus diastasis. Severe scoliosis. Electronically Signed   By: Van Clines M.D.   On: 12/19/2021 13:23    Labs:  CBC: Recent Labs    07/10/21 1123  WBC 6.7  HGB 12.1  HCT 36.5  PLT 311    COAGS: No results for input(s): "INR", "APTT" in the last 8760 hours.  BMP: Recent Labs    01/16/21 1145 07/10/21 1123  NA  --  138  K  --  3.9  CL  --  101  CO2  --  25  GLUCOSE  --  91  BUN  --  9  CALCIUM  --  9.3  CREATININE 0.70 0.85  GFRNONAA  --  >60    LIVER FUNCTION TESTS: Recent Labs    07/10/21 1123  BILITOT 1.1  AST 24  ALT 18  ALKPHOS 95  PROT 7.2  ALBUMIN 3.9     Assessment and Plan:  I spoke with [name] over the phone.  Follow-up CT on 12/18/2021 demonstrates stable left renal cryoablation defect with no evidence of recurrent enhancing carcinoma.  I recommended one more follow-up CT in 1 year at which time she will be 5 years post ablation.   Electronically Signed: Azzie Roup 12/20/2021, 1:16 PM    I spent a total of  10 Minutes in remote  clinical consultation, greater than 50% of which was counseling/coordinating care post ablation of a left renal carcinoma.    Visit type: Audio only (telephone). Audio (no video) only due to patient's lack of internet/smartphone capability. Alternative for in-person consultation at [facility], [address] [place] [address], [place], [place]. This visit type was conducted due to national recommendations for restrictions regarding the COVID-19 Pandemic (e.g. social distancing).  This format is felt to be most appropriate for this patient at this time.  All issues noted in this document were discussed and addressed.

## 2022-01-01 ENCOUNTER — Ambulatory Visit (INDEPENDENT_AMBULATORY_CARE_PROVIDER_SITE_OTHER): Payer: Medicare Other | Admitting: Family Medicine

## 2022-01-01 ENCOUNTER — Encounter: Payer: Self-pay | Admitting: Family Medicine

## 2022-01-01 VITALS — BP 120/74 | HR 66 | Temp 99.0°F | Wt 150.0 lb

## 2022-01-01 DIAGNOSIS — Z933 Colostomy status: Secondary | ICD-10-CM | POA: Diagnosis not present

## 2022-01-01 DIAGNOSIS — R238 Other skin changes: Secondary | ICD-10-CM | POA: Diagnosis not present

## 2022-01-01 MED ORDER — METOPROLOL SUCCINATE ER 25 MG PO TB24: 25.0000 mg | ORAL_TABLET | Freq: Every day | ORAL | 3 refills | Status: DC

## 2022-01-01 MED ORDER — MUPIROCIN 2 % EX OINT: 1.0000 | TOPICAL_OINTMENT | Freq: Two times a day (BID) | CUTANEOUS | 2 refills | Status: DC

## 2022-01-01 NOTE — Progress Notes (Signed)
   Subjective:    Patient ID: [name], female    DOB: 1955-09-19, 66 y.o.   MRN: [id number]  HPI Here for a sore bump on the left ear lobe that appeared about a month ago. She has been applying Neosporin with no improvement.    Review of Systems  Constitutional: Negative.   Respiratory: Negative.    Cardiovascular: Negative.        Objective:   Physical Exam Constitutional:      Appearance: She is not ill-appearing.  Cardiovascular:     Rate and Rhythm: Normal rate and regular rhythm.     Pulses: Normal pulses.     Heart sounds: Normal heart sounds.  Pulmonary:     Effort: Pulmonary effort is normal.     Breath sounds: Normal breath sounds.  Skin:    Comments: There is a small pimple on the left ear lobe which is tender and scabbed over   Neurological:     Mental Status: She is alert.           Assessment & Plan:  Pimple. She will apply Mupiricin BID as needed.  [name], MD

## 2022-01-02 ENCOUNTER — Other Ambulatory Visit: Payer: Self-pay | Admitting: Family Medicine

## 2022-01-02 ENCOUNTER — Encounter: Payer: Self-pay | Admitting: Internal Medicine

## 2022-01-08 ENCOUNTER — Telehealth: Payer: Medicare Other

## 2022-01-09 DIAGNOSIS — M0579 Rheumatoid arthritis with rheumatoid factor of multiple sites without organ or systems involvement: Secondary | ICD-10-CM | POA: Diagnosis not present

## 2022-01-10 ENCOUNTER — Telehealth: Payer: Self-pay | Admitting: Family Medicine

## 2022-01-10 DIAGNOSIS — M0549 Rheumatoid myopathy with rheumatoid arthritis of multiple sites: Secondary | ICD-10-CM | POA: Diagnosis not present

## 2022-01-10 DIAGNOSIS — R5381 Other malaise: Secondary | ICD-10-CM | POA: Diagnosis not present

## 2022-01-10 DIAGNOSIS — K5720 Diverticulitis of large intestine with perforation and abscess without bleeding: Secondary | ICD-10-CM | POA: Diagnosis not present

## 2022-01-10 DIAGNOSIS — M069 Rheumatoid arthritis, unspecified: Secondary | ICD-10-CM | POA: Diagnosis not present

## 2022-01-10 NOTE — Telephone Encounter (Signed)
Pt is calling and she needs new order for wheel chair from [facility]. Pt does not have phone number nor fax number for [facility]

## 2022-01-11 NOTE — Telephone Encounter (Signed)
Pt Rx for manual wheelchair  with adjustable foot rest, pt demographics and last office notes was sent to [facility]

## 2022-01-11 NOTE — Telephone Encounter (Signed)
The RX is ready to fax  

## 2022-01-16 DIAGNOSIS — M810 Age-related osteoporosis without current pathological fracture: Secondary | ICD-10-CM | POA: Diagnosis not present

## 2022-01-16 DIAGNOSIS — M85852 Other specified disorders of bone density and structure, left thigh: Secondary | ICD-10-CM | POA: Diagnosis not present

## 2022-01-16 DIAGNOSIS — Z780 Asymptomatic menopausal state: Secondary | ICD-10-CM | POA: Diagnosis not present

## 2022-01-16 DIAGNOSIS — M85851 Other specified disorders of bone density and structure, right thigh: Secondary | ICD-10-CM | POA: Diagnosis not present

## 2022-01-16 NOTE — Telephone Encounter (Signed)
Pt is calling and would like to know the phone number and fax number to where rx was sent to

## 2022-01-17 ENCOUNTER — Telehealth: Payer: Self-pay | Admitting: Pharmacist

## 2022-01-17 NOTE — Chronic Care Management (AMB) (Signed)
Chronic Care Management Pharmacy Assistant   Name: [name]  MRN: [id number] DOB: 02/14/1956  Reason for Encounter: Disease State / Hypertension Assessment Call   Conditions to be addressed/monitored: HTN  Recent office visits:  01/01/2022 [name] MD - Patient was seen for a skin pimple. Started Mupirocin 2% topical twice daily. No follow up noted.   Recent consult visits:  12/11/2021 [name] PA-C (ENT) -Patient was seen for Tympanic membrane perforation, left. No medication changes. No follow up noted.   12/05/2021 [name] (endocrinology) - Patient was seen for thyroid nodule and vitamin D deficiency. No additional chart notes.   Hospital visits:  None Medications: Outpatient Encounter Medications as of 01/17/2022  Medication Sig   Abatacept 125 MG/ML SOSY Inject 125 mg into the skin once a week. Has on Sunday   albuterol (VENTOLIN HFA) 108 (90 Base) MCG/ACT inhaler INHALE 1 PUFF INTO THE LUNGS EVERY 4 HOURS AS NEEDED FOR WHEEZING OR SHORTNESS OF BREATH   allopurinol (ZYLOPRIM) 100 MG tablet TAKE 1 TABLET BY MOUTH EVERY DAY   amLODipine (NORVASC) 5 MG tablet Take 1 tablet (5 mg total) by mouth daily.   aspirin EC 81 MG tablet Take 1 tablet (81 mg total) by mouth daily.   CALCIUM PO Take 1 tablet by mouth 2 (two) times daily.   cetirizine (ZYRTEC) 10 MG tablet Take 1 tablet (10 mg total) by mouth daily.   Cholecalciferol (VITAMIN D) 2000 units CAPS Take 2,000 Units by mouth daily.   ciprofloxacin-dexamethasone (CIPRODEX) OTIC suspension Place 4 drops into both ears 2 (two) times daily.   clobetasol (TEMOVATE) 0.05 % external solution Apply 1 application topically 2 (two) times daily.   clobetasol ointment (TEMOVATE) 1.75 % Apply 1 application topically 2 (two) times daily.   denosumab (PROLIA) 60 MG/ML SOSY injection Inject 60 mg into the skin every 6 (six) months.   diazepam (VALIUM) 2 MG tablet Take 1 tablet (2 mg total) by mouth every 12 (twelve) hours  as needed for anxiety.   diclofenac sodium (VOLTAREN) 1 % GEL Apply 1 application topically 4 (four) times daily as needed (arthritis).   docusate sodium (COLACE) 100 MG capsule Take 100 mg by mouth 2 (two) times daily.   esomeprazole (NEXIUM) 40 MG capsule TAKE 1 CAPSULE(40 MG) BY MOUTH DAILY IN THE AFTERNOON   folic acid (FOLVITE) 102 MCG tablet Take 400 mcg by mouth daily.   HYDROcodone-acetaminophen (NORCO) 10-325 MG tablet Take 1 tablet by mouth every 6 (six) hours as needed for moderate pain.   ketoconazole (NIZORAL) 2 % shampoo Apply 1 application topically 2 (two) times a week.   megestrol (MEGACE ES) 625 MG/5ML suspension SHAKE LIQUID AND TAKE 5 ML BY MOUTH THREE TIMES DAILY BEFORE MEALS AS NEEDED FOR APPETITE   Methotrexate Sodium (METHOTREXATE, PF,) 50 MG/2ML injection Inject 0.5 mLs into the muscle once a week. Sunday   metoprolol succinate (TOPROL-XL) 25 MG 24 hr tablet Take 1 tablet (25 mg total) by mouth daily.   Multiple Vitamin (MULTIVITAMIN WITH MINERALS) TABS tablet Take 1 tablet by mouth daily.   mupirocin ointment (BACTROBAN) 2 % Apply 1 Application topically 2 (two) times daily.   PERIDEX 0.12 % solution Use as directed 15 mLs in the mouth or throat 2 (two) times daily.    polyethylene glycol (MIRALAX / GLYCOLAX) 17 g packet Take 17 g by mouth daily as needed for mild constipation.   tiZANidine (ZANAFLEX) 4 MG tablet TAKE 1 TABLET(4 MG) BY MOUTH  TWICE DAILY   traMADol (ULTRAM) 50 MG tablet TAKE 1 TO 2 TABLETS(50 TO 100 MG) BY MOUTH EVERY 6 HOURS AS NEEDED   vitamin B-12 (CYANOCOBALAMIN) 500 MCG tablet Take 500 mcg by mouth daily.   vitamin C (ASCORBIC ACID) 500 MG tablet Take 500 mg by mouth daily.    No facility-administered encounter medications on file as of 01/17/2022.  Fill History: ORENCIA  125 MG/ML SOSY 12/22/2021 28   ALBUTEROL HFA INH (200 PUFFS) 8.5GM 01/03/2022 33   ALLOPURINOL '100MG'$  TABLETS 11/06/2021 90   AMLODIPINE BESYLATE  10 MG TABS 11/28/2021 90    CHLORHEXIDINE 0.12% ORAL RINSE 12/07/2021 31   ESOMEPRAZOLE MAGNESIUM '40MG'$  DR CAPS 12/05/2021 90   HYDROCODONE/ACETAMINOPHEN 10-325 T 01/05/2022 30   methotrexate sodium (PF) 25 mg/mL injection solution 10/09/2021 28   METOPROLOL ER SUCCINATE '25MG'$  TABS 01/02/2022 90   MUPIROCIN 2% OINTMENT 15GM 01/01/2022 20   TIZANIDINE '4MG'$  TABLETS 12/06/2021 45   TRAMADOL '50MG'$  TABLETS 09/21/2021 23   TRIAMCINOLONE 0.1% LOTION 60ML 01/02/2022 15   Reviewed chart prior to disease state call. Spoke with patient regarding BP  Recent Office Vitals: BP Readings from Last 3 Encounters:  01/01/22 120/74  09/13/21 118/60  07/25/21 138/80   Pulse Readings from Last 3 Encounters:  01/01/22 66  09/13/21 90  07/25/21 94    Wt Readings from Last 3 Encounters:  01/01/22 150 lb (68 kg)  10/16/21 150 lb (68 kg)  07/25/21 158 lb (71.7 kg)     Kidney Function Lab Results  Component Value Date/Time   CREATININE 0.85 07/10/2021 11:23 AM   CREATININE 0.70 01/16/2021 11:45 AM   CREATININE 0.76 11/19/2017 10:24 AM   GFR 107.53 04/20/2019 12:02 PM   GFRNONAA >60 07/10/2021 11:23 AM   GFRNONAA 85 11/19/2017 10:24 AM   GFRAA 98 11/19/2017 10:24 AM       Latest Ref Rng & Units 07/10/2021   11:23 AM 01/16/2021   11:45 AM 12/20/2020   10:32 AM  BMP  Glucose 70 - 99 mg/dL 91     BUN 8 - 23 mg/dL 9     Creatinine 0.44 - 1.00 mg/dL 0.85  0.70    Sodium 135 - 145 mmol/L 138     Potassium 3.5 - 5.1 mmol/L 3.9     Chloride 98 - 111 mmol/L 101     CO2 22 - 32 mmol/L 25     Calcium 8.9 - 10.3 mg/dL 9.3   9.8     Current antihypertensive regimen:  Amlodipine 5 mg daily Metoprolol 25 mg daily Patient mentioned she was out of Metoprolol, she will call [facility] for a refill, her chart indicates this was filled 01/02/22, she states she never picked this up.   How often are you checking your Blood Pressure? Patient states she is checking her blood pressures 2-3 times per week.   Current home BP  readings: Patients most recent blood pressure readings have been 109/79, 110/73 and 90/69. She doesn't have any other readings written down however, states this is what the readings generally are.  What recent interventions/DTPs have been made by any provider to improve Blood Pressure control since last CPP Visit: No recent interventions.  Any recent hospitalizations or ED visits since last visit with CPP? No recent hospital visits.   What diet changes have been made to improve Blood Pressure Control?  Patient follows no specific way of eating Breakfast - patient does not eat breakfast Lunch - patient will have a sandwich and  a soda Dinner - patient will have a meal containing a meat and vegetable.   What exercise is being done to improve your Blood Pressure Control?  Patient denies doing any exercise, states she sits in her chair most of the day. She was encouraged to get up at least every hour and walk around her house.   Adherence Review: Is the patient currently on ACE/ARB medication? No Does the patient have >5 day gap between last estimated fill dates? No  Care Gaps: AWV - completed 10/16/2021 Last BP - 120/74 on 01/01/2022 Foot exam - never done Eye exam - never done Malb - never done Hep C Screen - never done Shingrix - never done [facility] - overdue Tdap - overdue Pneumonia vaccine - overdue  Star Rating Drugs: None  [place] Pharmacist Assistant [phone]

## 2022-01-18 NOTE — Telephone Encounter (Signed)
Pt has been notified.

## 2022-01-18 NOTE — Telephone Encounter (Signed)
Spoke with [place] [facility] agent, confirmed that they received pt order / demographics for the wheelchair, agent stated that once approved by pt insurance they will contact pt

## 2022-01-19 ENCOUNTER — Encounter: Payer: Self-pay | Admitting: Family Medicine

## 2022-01-19 ENCOUNTER — Other Ambulatory Visit: Payer: Self-pay | Admitting: Family Medicine

## 2022-01-19 ENCOUNTER — Telehealth: Payer: Medicare Other | Admitting: Family Medicine

## 2022-01-19 ENCOUNTER — Telehealth (INDEPENDENT_AMBULATORY_CARE_PROVIDER_SITE_OTHER): Payer: Medicare Other | Admitting: Family Medicine

## 2022-01-19 DIAGNOSIS — F1190 Opioid use, unspecified, uncomplicated: Secondary | ICD-10-CM | POA: Diagnosis not present

## 2022-01-19 DIAGNOSIS — M069 Rheumatoid arthritis, unspecified: Secondary | ICD-10-CM

## 2022-01-19 MED ORDER — HYDROCODONE-ACETAMINOPHEN 10-325 MG PO TABS: 1.0000 | ORAL_TABLET | Freq: Four times a day (QID) | ORAL | 0 refills | Status: DC | PRN

## 2022-01-19 MED ORDER — METOPROLOL SUCCINATE ER 25 MG PO TB24: 25.0000 mg | ORAL_TABLET | Freq: Every day | ORAL | 3 refills | Status: DC

## 2022-01-19 MED ORDER — ONDANSETRON 8 MG PO TBDP: 8.0000 mg | ORAL_TABLET | Freq: Three times a day (TID) | ORAL | 5 refills | Status: DC | PRN

## 2022-01-19 NOTE — Progress Notes (Signed)
   Subjective:    Patient ID: [name], female    DOB: 1956/06/02, 66 y.o.   MRN: [id number]  HPI Virtual Visit via Telephone Note  I connected with the patient on 01/19/22 at  1:00 PM EDT by telephone and verified that I am speaking with the correct person using two identifiers.   I discussed the limitations, risks, security and privacy concerns of performing an evaluation and management service by telephone and the availability of in person appointments. I also discussed with the patient that there may be a patient responsible charge related to this service. The patient expressed understanding and agreed to proceed.  Location patient: home Location provider: work or home office Participants present for the call: patient, provider Patient did not have a visit in the prior 7 days to address this/these issue(s).   History of Present Illness: Here for pain management. Her joint pains are about the same as usual.    Observations/Objective: Patient sounds cheerful and well on the phone. I do not appreciate any SOB. Speech and thought processing are grossly intact. Patient reported vitals:  Assessment and Plan: Pain management.  Indication for chronic opioid: RA Medication and dose: Norco 10-325 # pills per month: 120 Last UDS date: 07-25-21 Opioid Treatment Agreement signed (Y/N): 07-12-17 Opioid Treatment Agreement last reviewed with patient:  01-19-22 [place] reviewed this encounter (include red flags): Yes Meds were refilled.  [name], MD   Follow Up Instructions:     [phone] 5-10 [phone] 11-20 [phone] 21-30 I did not refer this patient for an OV in the next 24 hours for this/these issue(s).  I discussed the assessment and treatment plan with the patient. The patient was provided an opportunity to ask questions and all were answered. The patient agreed with the plan and demonstrated an understanding of the instructions.   The patient was advised to call back or seek  an in-person evaluation if the symptoms worsen or if the condition fails to improve as anticipated.  I provided 12 minutes of non-face-to-face time during this encounter.   [name], MD     Review of Systems     Objective:   Physical Exam        Assessment & Plan:

## 2022-01-29 ENCOUNTER — Telehealth: Payer: Self-pay | Admitting: Family Medicine

## 2022-01-29 DIAGNOSIS — M069 Rheumatoid arthritis, unspecified: Secondary | ICD-10-CM | POA: Diagnosis not present

## 2022-01-29 DIAGNOSIS — M0549 Rheumatoid myopathy with rheumatoid arthritis of multiple sites: Secondary | ICD-10-CM | POA: Diagnosis not present

## 2022-01-29 DIAGNOSIS — R5381 Other malaise: Secondary | ICD-10-CM | POA: Diagnosis not present

## 2022-01-29 DIAGNOSIS — K5720 Diverticulitis of large intestine with perforation and abscess without bleeding: Secondary | ICD-10-CM | POA: Diagnosis not present

## 2022-01-29 NOTE — Telephone Encounter (Signed)
Pt called to say the medication prescribed is not working and would the MD be able to prescribe something else or maybe refer her to an ENT... Please advise.  [phone]

## 2022-01-29 NOTE — Telephone Encounter (Signed)
01/19/22, patient was prescribed Ondansetron '8mg'$ .  Please advise

## 2022-01-30 DIAGNOSIS — Z933 Colostomy status: Secondary | ICD-10-CM | POA: Diagnosis not present

## 2022-01-30 NOTE — Telephone Encounter (Signed)
I need to know more about the nausea. If this is from vertigo, then an ENT consult would be appropriate. If she is not dizzy, then there nausea is coming from something else

## 2022-01-30 NOTE — Telephone Encounter (Addendum)
Lvm for patient to call office back with more details.

## 2022-01-31 DIAGNOSIS — H6192 Disorder of left external ear, unspecified: Secondary | ICD-10-CM | POA: Diagnosis not present

## 2022-01-31 DIAGNOSIS — H7292 Unspecified perforation of tympanic membrane, left ear: Secondary | ICD-10-CM | POA: Diagnosis not present

## 2022-01-31 NOTE — Telephone Encounter (Signed)
Called and spoke with patient, she stated that she went to the ear doctor, and was told to continue taking the medication that was prescribed by Dr. Sarajane Jews.   Patient stated at this time nothing else is needed.

## 2022-02-06 ENCOUNTER — Telehealth: Payer: Self-pay | Admitting: Family Medicine

## 2022-02-06 DIAGNOSIS — M069 Rheumatoid arthritis, unspecified: Secondary | ICD-10-CM

## 2022-02-06 NOTE — Telephone Encounter (Signed)
Please take care of this.  

## 2022-02-06 NOTE — Telephone Encounter (Signed)
Wheelchair was delivered but was too small.  [facility] states she will need a new request for a bigger wheel chair.  Patient states she needs the next size up.  Please return patient's call.

## 2022-02-07 NOTE — Telephone Encounter (Signed)
I called [employer] at [phone] and spoke with [facility] for assistance.  [facility] stated the order was received for a manual standard wheelchair and a standard 16X16 wheelchair was delivered on 8/7.  She stated the provider could enter an order for a [place] in order for the patient to have a larger one?  Message sent to PCP.

## 2022-02-08 NOTE — Telephone Encounter (Signed)
I left a detailed message at the patient's home number stating the order was placed for a larger wheelchair as below and someone from [place] should contact her with further information.  Community message sent to DME team with [employer].

## 2022-02-08 NOTE — Telephone Encounter (Signed)
Okay, please reorder for an 18 X 18 wheelchair

## 2022-02-21 ENCOUNTER — Other Ambulatory Visit: Payer: Self-pay | Admitting: Family Medicine

## 2022-02-21 NOTE — Telephone Encounter (Signed)
Last VV-01/19/22 Last OV- 01/19/22-90 tabs, 0 refill-1 tab BID  No future OV scheduled

## 2022-02-22 ENCOUNTER — Other Ambulatory Visit: Payer: Self-pay | Admitting: Family Medicine

## 2022-02-23 ENCOUNTER — Telehealth: Payer: Self-pay | Admitting: Family Medicine

## 2022-02-23 ENCOUNTER — Other Ambulatory Visit: Payer: Self-pay

## 2022-02-23 MED ORDER — TIZANIDINE HCL 4 MG PO TABS: ORAL_TABLET | ORAL | 0 refills | Status: DC

## 2022-02-23 NOTE — Telephone Encounter (Signed)
Pt called to say she did not receive the muscle relaxers and she would like to know why?  Please advise.  [facility] [phone] - [address] [place], [address] AT [place] Phone:  [phone]  Fax:  [phone]

## 2022-02-23 NOTE — Telephone Encounter (Signed)
Spoke with patient.  Message complete.  

## 2022-02-28 DIAGNOSIS — M810 Age-related osteoporosis without current pathological fracture: Secondary | ICD-10-CM | POA: Diagnosis not present

## 2022-03-01 DIAGNOSIS — M069 Rheumatoid arthritis, unspecified: Secondary | ICD-10-CM | POA: Diagnosis not present

## 2022-03-01 DIAGNOSIS — R5381 Other malaise: Secondary | ICD-10-CM | POA: Diagnosis not present

## 2022-03-01 DIAGNOSIS — M0549 Rheumatoid myopathy with rheumatoid arthritis of multiple sites: Secondary | ICD-10-CM | POA: Diagnosis not present

## 2022-03-01 DIAGNOSIS — K5720 Diverticulitis of large intestine with perforation and abscess without bleeding: Secondary | ICD-10-CM | POA: Diagnosis not present

## 2022-03-02 DIAGNOSIS — Z933 Colostomy status: Secondary | ICD-10-CM | POA: Diagnosis not present

## 2022-03-09 ENCOUNTER — Other Ambulatory Visit: Payer: Self-pay | Admitting: Family Medicine

## 2022-03-09 DIAGNOSIS — I10 Essential (primary) hypertension: Secondary | ICD-10-CM

## 2022-03-12 DIAGNOSIS — M0549 Rheumatoid myopathy with rheumatoid arthritis of multiple sites: Secondary | ICD-10-CM | POA: Diagnosis not present

## 2022-03-12 DIAGNOSIS — R5381 Other malaise: Secondary | ICD-10-CM | POA: Diagnosis not present

## 2022-03-12 DIAGNOSIS — K5720 Diverticulitis of large intestine with perforation and abscess without bleeding: Secondary | ICD-10-CM | POA: Diagnosis not present

## 2022-03-12 DIAGNOSIS — M069 Rheumatoid arthritis, unspecified: Secondary | ICD-10-CM | POA: Diagnosis not present

## 2022-03-14 ENCOUNTER — Telehealth: Payer: Self-pay | Admitting: Pharmacist

## 2022-03-14 NOTE — Chronic Care Management (AMB) (Signed)
Chronic Care Management Pharmacy Assistant   Name: ASHANNA HEINSOHN  MRN: [id number] DOB: [birth year]  Reason for Encounter: Disease State / Hyperlipidemia Assessment Call   Conditions to be addressed/monitored: HLD  Recent office visits:  01/19/2022 [name] MD - Patient was seen for Chronic narcotic use additional concerns. Started Ondansetron 8 mg q 8 hours prn. Changed Norco 10/325 mg to Take 1 tablet by mouth every 6 (six) hours as needed for moderate pain., Starting Sat 03/31/2022, Until Mon 04/30/2022. No follow up noted.   Recent consult visits:  01/31/2022 [name] PA-C (ENT) - Patient was seen for Tympanic membrane perforation, left and an additional concern. Increase mupirocin ointment to 4 times daily. Return to clinic 4 to 6 weeks.  Hospital visits:  None  Medications: Outpatient Encounter Medications as of 03/14/2022  Medication Sig   Abatacept 125 MG/ML SOSY Inject 125 mg into the skin once a week. Has on Sunday   albuterol (VENTOLIN HFA) 108 (90 Base) MCG/ACT inhaler INHALE 1 PUFF INTO THE LUNGS EVERY 4 HOURS AS NEEDED FOR WHEEZING OR SHORTNESS OF BREATH   allopurinol (ZYLOPRIM) 100 MG tablet TAKE 1 TABLET BY MOUTH EVERY DAY   amLODipine (NORVASC) 5 MG tablet TAKE 1 TABLET(5 MG) BY MOUTH DAILY   aspirin EC 81 MG tablet Take 1 tablet (81 mg total) by mouth daily.   CALCIUM PO Take 1 tablet by mouth 2 (two) times daily.   cetirizine (ZYRTEC) 10 MG tablet Take 1 tablet (10 mg total) by mouth daily.   Cholecalciferol (VITAMIN D) 2000 units CAPS Take 2,000 Units by mouth daily.   ciprofloxacin-dexamethasone (CIPRODEX) OTIC suspension Place 4 drops into both ears 2 (two) times daily.   clobetasol (TEMOVATE) 0.05 % external solution Apply 1 application topically 2 (two) times daily.   clobetasol ointment (TEMOVATE) 5.62 % Apply 1 application topically 2 (two) times daily.   denosumab (PROLIA) 60 MG/ML SOSY injection Inject 60 mg into the skin every 6 (six) months.    diazepam (VALIUM) 2 MG tablet Take 1 tablet (2 mg total) by mouth every 12 (twelve) hours as needed for anxiety.   diclofenac sodium (VOLTAREN) 1 % GEL Apply 1 application topically 4 (four) times daily as needed (arthritis).   docusate sodium (COLACE) 100 MG capsule Take 100 mg by mouth 2 (two) times daily.   esomeprazole (NEXIUM) 40 MG capsule TAKE 1 CAPSULE(40 MG) BY MOUTH DAILY IN THE AFTERNOON   folic acid (FOLVITE) 130 MCG tablet Take 400 mcg by mouth daily.   [START ON 03/31/2022] HYDROcodone-acetaminophen (NORCO) 10-325 MG tablet Take 1 tablet by mouth every 6 (six) hours as needed for moderate pain.   ketoconazole (NIZORAL) 2 % shampoo Apply 1 application topically 2 (two) times a week.   megestrol (MEGACE ES) 625 MG/5ML suspension SHAKE LIQUID AND TAKE 5 ML BY MOUTH THREE TIMES DAILY BEFORE MEALS AS NEEDED FOR APPETITE   Methotrexate Sodium (METHOTREXATE, PF,) 50 MG/2ML injection Inject 0.5 mLs into the muscle once a week. Sunday   metoprolol succinate (TOPROL-XL) 25 MG 24 hr tablet Take 1 tablet (25 mg total) by mouth daily.   Multiple Vitamin (MULTIVITAMIN WITH MINERALS) TABS tablet Take 1 tablet by mouth daily.   mupirocin ointment (BACTROBAN) 2 % Apply 1 Application topically 2 (two) times daily.   ondansetron (ZOFRAN-ODT) 8 MG disintegrating tablet Take 1 tablet (8 mg total) by mouth every 8 (eight) hours as needed for nausea or vomiting.   PERIDEX 0.12 % solution Use as  directed 15 mLs in the mouth or throat 2 (two) times daily.    polyethylene glycol (MIRALAX / GLYCOLAX) 17 g packet Take 17 g by mouth daily as needed for mild constipation.   tiZANidine (ZANAFLEX) 4 MG tablet TAKE 1 TABLET(4 MG) BY MOUTH TWICE DAILY   tiZANidine (ZANAFLEX) 4 MG tablet TAKE 1 TABLET(4 MG) BY MOUTH TWICE DAILY   traMADol (ULTRAM) 50 MG tablet TAKE 1 TO 2 TABLETS(50 TO 100 MG) BY MOUTH EVERY 6 HOURS AS NEEDED   vitamin B-12 (CYANOCOBALAMIN) 500 MCG tablet Take 500 mcg by mouth daily.   vitamin C  (ASCORBIC ACID) 500 MG tablet Take 500 mg by mouth daily.    No facility-administered encounter medications on file as of 03/14/2022.  Fill History:  TRAMADOL '50MG'$  TABLETS 01/13/2022 23   TIZANIDINE '4MG'$  TABLETS 02/23/2022 45   POLYETH GLYC 3350 NF PWDR PACKS 02/21/2022 14   ONDANSETRON ODT '8MG'$  TABLETS 02/02/2022 20   MUPIROCIN 2% OINTMENT 15GM 01/01/2022 20   METOPROLOL ER SUCCINATE '25MG'$  TABS 01/02/2022 90   METHOTREXATE '25MG'$ /ML INJ 01/09/2022 84   MEGESTROL '625MG'$ /5ML SUSPENSION 05/17/2020 10   KETOCONAZOLE 2% SHAMPOO 120ML 03/04/2020 15   HYDROCODONE/ACETAMINOPHEN 10-325 T 03/05/2022 30   ESOMEPRAZOLE MAGNESIUM '40MG'$  DR CAPS 02/21/2022 90   DIAZEPAM '2MG'$  TABLETS 07/22/2020 30   CLOBETASOL PROP 0.05% SOL 50ML 07/27/2021 15   AMLODIPINE BESYLATE  5 MG TABS 03/10/2022 30   ALLOPURINOL '100MG'$  TABLETS 02/22/2022 90   ALBUTEROL HFA INH (200 PUFFS) 8.5GM 01/03/2022 33   ORENCIA  125 MG/ML SOSY 02/12/2022 28   03/14/2022 Name: CAMDYN LADEN MRN: [id number] DOB: May 07, 1956 LYNASIA MELOCHE is a 66 y.o. year old female who is a primary care patient of [name], MD.  Comprehensive medication review performed; Spoke to patient regarding cholesterol  Lipid Panel    Component Value Date/Time   CHOL 238 (H) 06/20/2020 1142   TRIG 86.0 06/20/2020 1142   HDL 76.10 06/20/2020 1142   LDLCALC 145 (H) 06/20/2020 1142    10-year ASCVD risk score: The ASCVD Risk score (Arnett DK, et al., 2019) failed to calculate for the following reasons:   The patient has a prior MI or stroke diagnosis  Current antihyperlipidemic regimen:  No medicaitons  Previous antihyperlipidemic medications tried:  simvastatin caused myalgias  ASCVD risk enhancing conditions: age >53 and HTN  What recent interventions/DTPs have been made by any provider to improve Cholesterol control since last CPP Visit: No recent interventions  Any recent hospitalizations or ED visits since last visit with CPP? No  recent hospital visits.   What diet changes have been made to improve Cholesterol?  Patient has not made any changes to her diet Breakfast - patient does not eat breakfast Lunch - patient will have a sandwich and a soda Dinner - patient will have a meat and vegetable.   What exercise is being done to improve Cholesterol?  Patient denies doing any exercise, states she sits in her chair most of the day. She was encouraged to get up often an walk around her house.   Adherence Review: Does the patient have >5 day gap between last estimated fill dates? No   Care Gaps: AWV - completed 10/16/2021 Last BP - 120/74 on 01/01/2022 Foot exam - never done Eye exam - never done Urine ACR - never done Hep C Screen - never done Shingrix - never done HGA1C - overdue Tdap - overdue Pneumonia vaccine - overdue Flu - due  Star Rating  Drugs: None  [place]  Clinical Pharmacist Assistant [phone]

## 2022-03-15 ENCOUNTER — Telehealth: Payer: Self-pay | Admitting: Pharmacist

## 2022-03-15 NOTE — Chronic Care Management (AMB) (Signed)
    Chronic Care Management Pharmacy Assistant   Name: ELAYNA TOBLER  MRN: [id number] DOB: [birth year]  Reason for Encounter: Reschedule appointment with [name], Clinical Pharmacist.   Rescheduled with patient to 05/14/2022.   [place] Pharmacist Assistant [phone]

## 2022-03-31 DIAGNOSIS — M069 Rheumatoid arthritis, unspecified: Secondary | ICD-10-CM | POA: Diagnosis not present

## 2022-03-31 DIAGNOSIS — K5720 Diverticulitis of large intestine with perforation and abscess without bleeding: Secondary | ICD-10-CM | POA: Diagnosis not present

## 2022-03-31 DIAGNOSIS — M0549 Rheumatoid myopathy with rheumatoid arthritis of multiple sites: Secondary | ICD-10-CM | POA: Diagnosis not present

## 2022-03-31 DIAGNOSIS — R5381 Other malaise: Secondary | ICD-10-CM | POA: Diagnosis not present

## 2022-04-02 DIAGNOSIS — H7292 Unspecified perforation of tympanic membrane, left ear: Secondary | ICD-10-CM | POA: Diagnosis not present

## 2022-04-02 DIAGNOSIS — H6192 Disorder of left external ear, unspecified: Secondary | ICD-10-CM | POA: Diagnosis not present

## 2022-04-02 DIAGNOSIS — Z933 Colostomy status: Secondary | ICD-10-CM | POA: Diagnosis not present

## 2022-04-03 ENCOUNTER — Telehealth: Payer: Self-pay | Admitting: Family Medicine

## 2022-04-03 NOTE — Telephone Encounter (Signed)
Pt states pharmacy does not have HYDROcodone-acetaminophen ([place]) 10-325 MG tablet  and she has called other pharmacies that don't have it either. is looking for a substitute

## 2022-04-04 NOTE — Telephone Encounter (Signed)
Please disregard [facility] called her  and she pick up medication yesterday

## 2022-04-09 DIAGNOSIS — M1A09X0 Idiopathic chronic gout, multiple sites, without tophus (tophi): Secondary | ICD-10-CM | POA: Diagnosis not present

## 2022-04-09 DIAGNOSIS — M0579 Rheumatoid arthritis with rheumatoid factor of multiple sites without organ or systems involvement: Secondary | ICD-10-CM | POA: Diagnosis not present

## 2022-04-09 DIAGNOSIS — M1991 Primary osteoarthritis, unspecified site: Secondary | ICD-10-CM | POA: Diagnosis not present

## 2022-04-09 DIAGNOSIS — R5383 Other fatigue: Secondary | ICD-10-CM | POA: Diagnosis not present

## 2022-04-09 DIAGNOSIS — Z79899 Other long term (current) drug therapy: Secondary | ICD-10-CM | POA: Diagnosis not present

## 2022-04-10 ENCOUNTER — Other Ambulatory Visit: Payer: Self-pay | Admitting: Family Medicine

## 2022-04-10 NOTE — Telephone Encounter (Signed)
Pt LOV was on 01/01/2022 Last office visit was on 09/21/21 Please advise

## 2022-04-16 DIAGNOSIS — R5381 Other malaise: Secondary | ICD-10-CM | POA: Diagnosis not present

## 2022-04-16 DIAGNOSIS — M0549 Rheumatoid myopathy with rheumatoid arthritis of multiple sites: Secondary | ICD-10-CM | POA: Diagnosis not present

## 2022-04-16 DIAGNOSIS — K5720 Diverticulitis of large intestine with perforation and abscess without bleeding: Secondary | ICD-10-CM | POA: Diagnosis not present

## 2022-04-16 DIAGNOSIS — M069 Rheumatoid arthritis, unspecified: Secondary | ICD-10-CM | POA: Diagnosis not present

## 2022-04-23 ENCOUNTER — Encounter: Payer: Self-pay | Admitting: Family Medicine

## 2022-04-23 ENCOUNTER — Telehealth (INDEPENDENT_AMBULATORY_CARE_PROVIDER_SITE_OTHER): Payer: Medicare Other | Admitting: Family Medicine

## 2022-04-23 DIAGNOSIS — M069 Rheumatoid arthritis, unspecified: Secondary | ICD-10-CM

## 2022-04-23 DIAGNOSIS — F1190 Opioid use, unspecified, uncomplicated: Secondary | ICD-10-CM

## 2022-04-23 MED ORDER — HYDROCODONE-ACETAMINOPHEN 10-325 MG PO TABS: 1.0000 | ORAL_TABLET | Freq: Four times a day (QID) | ORAL | 0 refills | Status: AC | PRN

## 2022-04-23 MED ORDER — HYDROCODONE-ACETAMINOPHEN 10-325 MG PO TABS: 1.0000 | ORAL_TABLET | Freq: Four times a day (QID) | ORAL | 0 refills | Status: DC | PRN

## 2022-04-23 NOTE — Progress Notes (Signed)
   Subjective:    Patient ID: [name], female    DOB: 1956-03-04, 66 y.o.   MRN: [id number]  HPI Virtual Visit via Telephone Note  I connected with the patient on 04/23/22 at  3:00 PM EDT by telephone and verified that I am speaking with the correct person using two identifiers.   I discussed the limitations, risks, security and privacy concerns of performing an evaluation and management service by telephone and the availability of in person appointments. I also discussed with the patient that there may be a patient responsible charge related to this service. The patient expressed understanding and agreed to proceed.  Location patient: home Location provider: work or home office Participants present for the call: patient, provider Patient did not have a visit in the prior 7 days to address this/these issue(s).   History of Present Illness: Here for pain management. She is doing fairly well with her pain control.    Observations/Objective: Patient sounds cheerful and well on the phone. I do not appreciate any SOB. Speech and thought processing are grossly intact. Patient reported vitals:  Assessment and Plan: Pain management. Indication for chronic opioid: RA Medication and dose: Norco 10-325 # pills per month: 120 Last UDS date: 07-25-21 Opioid Treatment Agreement signed (Y/N): 07-12-17 Opioid Treatment Agreement last reviewed with patient:  04-23-22 [place] reviewed this encounter (include red flags): Yes Meds were refilled.  [name], MD   Follow Up Instructions:     [phone] 5-10 [phone] 11-20 9443 21-30 I did not refer this patient for an OV in the next 24 hours for this/these issue(s).  I discussed the assessment and treatment plan with the patient. The patient was provided an opportunity to ask questions and all were answered. The patient agreed with the plan and demonstrated an understanding of the instructions.   The patient was advised to call back or  seek an in-person evaluation if the symptoms worsen or if the condition fails to improve as anticipated.  I provided 18 minutes of non-face-to-face time during this encounter.   [name], MD     Review of Systems     Objective:   Physical Exam        Assessment & Plan:

## 2022-04-25 DIAGNOSIS — R5381 Other malaise: Secondary | ICD-10-CM | POA: Diagnosis not present

## 2022-04-25 DIAGNOSIS — M069 Rheumatoid arthritis, unspecified: Secondary | ICD-10-CM | POA: Diagnosis not present

## 2022-04-25 DIAGNOSIS — M0549 Rheumatoid myopathy with rheumatoid arthritis of multiple sites: Secondary | ICD-10-CM | POA: Diagnosis not present

## 2022-04-25 DIAGNOSIS — K5720 Diverticulitis of large intestine with perforation and abscess without bleeding: Secondary | ICD-10-CM | POA: Diagnosis not present

## 2022-04-30 ENCOUNTER — Other Ambulatory Visit: Payer: Self-pay | Admitting: Family Medicine

## 2022-04-30 DIAGNOSIS — Z933 Colostomy status: Secondary | ICD-10-CM | POA: Diagnosis not present

## 2022-05-01 ENCOUNTER — Telehealth: Payer: Medicare Other

## 2022-05-03 NOTE — Telephone Encounter (Signed)
Pt states she still has not heard anything about her wheelchair and wants a call back or information on who she can call to get her chair.

## 2022-05-11 ENCOUNTER — Telehealth: Payer: Self-pay | Admitting: Pharmacist

## 2022-05-11 DIAGNOSIS — M0549 Rheumatoid myopathy with rheumatoid arthritis of multiple sites: Secondary | ICD-10-CM | POA: Diagnosis not present

## 2022-05-11 DIAGNOSIS — R5381 Other malaise: Secondary | ICD-10-CM | POA: Diagnosis not present

## 2022-05-11 DIAGNOSIS — M069 Rheumatoid arthritis, unspecified: Secondary | ICD-10-CM | POA: Diagnosis not present

## 2022-05-11 DIAGNOSIS — K5720 Diverticulitis of large intestine with perforation and abscess without bleeding: Secondary | ICD-10-CM | POA: Diagnosis not present

## 2022-05-11 NOTE — Chronic Care Management (AMB) (Signed)
     Chronic Care Management Pharmacy Assistant   Name: [name]  MRN: [id number] DOB: January 15, 1956  05/14/2022 [place], No answer, left message of appointment on 05/14/2022 at 3:30 via telephone visit with [name], Pharm D. Notified to have all medications, supplements, blood pressure and/or blood sugar logs available during appointment and to return call if need to reschedule.  Care Gaps: AWV - completed 10/16/2021 Last BP - 120/74 on 01/01/2022 Foot exam - never done Eye exam - never done Urine ACR - never done Hep C Screen - never done Shingrix - never done HGA1C - overdue Tdap - overdue Pneumonia vaccine - overdue Flu - due  Star Rating Drug: None   Any gaps in medications fill history? No  [name] [facility]  Catering manager [phone]

## 2022-05-11 NOTE — Telephone Encounter (Signed)
Spoke with [place] regarding pt wheelchair, advised that pt state that the wheelchair foot rest is too small for her and needs a bigger foot rest, advised that [employer] will switch the wheelchair and pt will be notified when the shipment is ready, pt daughter verbalized understanding

## 2022-05-11 NOTE — Progress Notes (Deleted)
Cardiology Office Note:    Date:  05/11/2022   ID:  [name], DOB [birth year], MRN [id number]  PCP:  [name], MD   [facility] Providers Cardiologist:  Sinclair Grooms, MD     Referring MD: [name], MD   Chief Complaint: overdue follow-up chronic HFpEF, aortic dilatation  History of Present Illness:    [name] is a 66 y.o. female with a hx of HTN, chronic HFpEF, tachycardia induced cardiomyopathy (now recovered), legal blindness, atrial flutter s/p ablation 2015, stroke, RA, HTN, prior history of alcohol abuse,first degree AV block, and possible Marfan's syndrome with significant MSK abnormality and joint effusion.   She underwent successful catheter ablation of 2 distinct reentrant circuits by Dr. Lovena Le due to incessant atrial flutter in 2015. She returned in 10/2016 with concerns for palpitations. Cardiac monitor revealed NSR and sinus tachycardia, rare PACs.   She was last seen in our office on 04/04/20 by Dr. Tamala Julian. No medication changes were made and one year follow-up was recommended. He recommended repeating echo for evaluation of aortic root. Echo 03/2021 revealed LVEF 50-55%, aortic size normal, mild to moderate mitral valve leak.   Last cardiology clinic visit was with me on 09/13/21 at which time she was accopanied by her daughter. She is in a wheelchair. Not very physically active. She denied chest pain, shortness of breath, lower extremity edema, fatigue,  melena, hematuria, hemoptysis, diaphoresis, weakness, presyncope, syncope, orthopnea, and PND. Reported occasional palpitations that are burdensome to her.  EKG revealed PACs TSH and magnesium were normal.  Cardiac monitor revealed underlying rhythm sinus rhythm, frequent PACs 8.7% burden, no atrial fibrillation, self terminating WCT occurred 5 times, longest 8.5 seconds, self terminating SVT frequent, longest lasting 16 seconds.  Was encouraged to start metoprolol, however she was feeling fine  and did not want to start medication.  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   bilateral     Past Surgical History:  Procedure Laterality Date   ABLATION  01/04/14   atrial flutter ablation (2 circuits) by Dr Lovena Le   ATRIAL FLUTTER ABLATION N/A 01/04/2014   Procedure: ATRIAL FLUTTER ABLATION;  Surgeon: Evans Lance, MD;  Location: [facility] CATH LAB;  Service: Cardiovascular;  Laterality: N/A;   BUNIONECTOMY Bilateral 1/10   COLON SURGERY     colectomy   COLOSTOMY  11/27/2012   HERNIA REPAIR     IR RADIOLOGIST EVAL & MGMT  10/01/2017   IR RADIOLOGIST EVAL &  MGMT  11/21/2017   IR RADIOLOGIST EVAL & MGMT  02/13/2018   IR RADIOLOGIST EVAL & MGMT  01/08/2019   IR RADIOLOGIST EVAL & MGMT  12/29/2019   IR RADIOLOGIST EVAL & MGMT  01/31/2021   IR RADIOLOGIST EVAL & MGMT  12/20/2021   LAPAROSCOPIC ABDOMINAL EXPLORATION N/A 01/06/2013   Procedure: LAPAROSCOPIC ABDOMINAL EXPLORATION;  Surgeon: Adin Hector, MD;  Location: [facility] ORS;  Service: General;  Laterality: N/A;   LAPAROSCOPIC LYSIS OF ADHESIONS N/A 01/06/2013   Procedure: LAPAROSCOPIC LYSIS OF ADHESIONS/ INTEROTOMY REPAIR;  Surgeon: Adin Hector, MD;  Location: [facility]  ORS;  Service: General;  Laterality: N/A;   LAPAROTOMY N/A 11/27/2012   Procedure: EXPLORATORY LAPAROTOMY SIGMOID COLECTOMY, COLOSTOMY;  Surgeon: Madilyn Hook, DO;  Location: [facility] ORS;  Service: General;  Laterality: N/A;   RADIOLOGY WITH ANESTHESIA Left 10/23/2017   Procedure: CT RENAL CRYO AND BIOPSY;  Surgeon: Aletta Edouard, MD;  Location: [facility] ORS;  Service: Radiology;  Laterality: Left;   TOTAL KNEE ARTHROPLASTY Bilateral 6/08    Current Medications: No outpatient medications have been marked as taking for the 05/15/22 encounter (Appointment) with Ann Maki, Lanice Schwab, NP.     Allergies:   Spinach   Social History   Socioeconomic History   Marital status: Single    Spouse name: Not on file   Number of children: 1   Years of education: Not on file   Highest education level: Not on file  Occupational History   Not on file  Tobacco Use   Smoking status: Former    Packs/day: 1.00    Types: Cigarettes    Quit date: 11/27/2012    Years since quitting: 9.4   Smokeless tobacco: Never  Vaping Use   Vaping Use: Former  Substance and Sexual Activity   Alcohol use: No    Alcohol/week: 0.0 standard drinks of alcohol    Comment: recovering alcoholic sober since [id number]   Drug use: Yes    Types: Hydrocodone   Sexual activity: Not Currently    Partners: Male    Birth control/protection: None  Other Topics Concern   Not on file  Social History Narrative   Not on file   Social Determinants of Health   Financial Resource Strain: Low Risk  (10/16/2021)   Overall Financial Resource Strain (CARDIA)    Difficulty of Paying Living Expenses: Not hard at all  Food Insecurity: No Food Insecurity (10/16/2021)   Hunger Vital Sign    Worried About Running Out of Food in the Last Year: Never true    [place] in the Last Year: Never true  Transportation Needs: No Transportation Needs (10/16/2021)   PRAPARE - Hydrologist (Medical): No    Lack of Transportation  (Non-Medical): No  Physical Activity: Inactive (10/16/2021)   Exercise Vital Sign    Days of Exercise per Week: 0 days    Minutes of Exercise per Session: 0 min  Stress: Stress Concern Present (10/16/2021)   [place]    Feeling of Stress : To some extent  Social Connections: Unknown (10/05/2020)   Social Connection and Isolation Panel [NHANES]    Frequency of Communication with Friends and Family: Not on file    Frequency of Social Gatherings with Friends and Family: Once a week    Attends Religious Services: Never    Marine scientist or Organizations: No    Attends Archivist Meetings:  Never    Marital Status: Never married     Family History: The patient's family history is negative for Heart attack, Colon cancer, and Osteoporosis.  ROS:   Please see the history of present illness.  All other systems reviewed and are negative.  Labs/Other Studies Reviewed:    The following studies were reviewed today:  Cardiac monitor 10/13/20  Basic rhythm is NSR with average HR 82 bpm Self terminating WCT occurred 5 times, longest 8.5 seconds. No symptoms. Frequent self terminating SVT longest lasting 16 seconds, and also asymptomatic PAC burden 8.7% No atrial fibrillation. Rare second degree AVB during sllep.  Echo 03/27/21  Left Ventricle: Abnormal septal motion Note PVCls during study. Left  ventricular ejection fraction, by estimation, is 50 to 55%. The left  ventricle has low normal function. The left ventricle has no regional wall  motion abnormalities. The left  ventricular internal cavity size was normal in size. There is no left  ventricular hypertrophy. Left ventricular diastolic parameters were  normal.  Right Ventricle: The right ventricular size is normal. No increase in  right ventricular wall thickness. Right ventricular systolic function is  normal.  Left Atrium: Left atrial size was  normal in size.  Right Atrium: Right atrial size was normal in size.  Pericardium: There is no evidence of pericardial effusion.  Mitral Valve: MR jet not well characterized Eccnetric posteriorly directed  ? mechanism restricted posterior leaflet motion MR looked moderate to me  as well on TTE 03/30/19. The mitral valve is abnormal. There is mild  thickening of the mitral valve  leaflet(s). Moderate mitral valve regurgitation. No evidence of mitral  valve stenosis.  Tricuspid Valve: The tricuspid valve is normal in structure. Tricuspid  valve regurgitation is mild to moderate. No evidence of tricuspid  stenosis.  Aortic Valve: The aortic valve is tricuspid. Aortic valve regurgitation is  not visualized. No aortic stenosis is present.  Pulmonic Valve: The pulmonic valve was normal in structure. Pulmonic valve  regurgitation is not visualized. No evidence of pulmonic stenosis.  Aorta: The aortic root is normal in size and structure.  Venous: The inferior vena cava is normal in size with greater than 50%  respiratory variability, suggesting right atrial pressure of 3 mmHg.  IAS/Shunts: No atrial level shunt detected by color flow Doppler.   Cardiac monitor 11/2016  Normal sinus rhythm and sinus tachycardia No reported complaints Rare PAC's   Normal study   Recent Labs: 07/10/2021: ALT 18; BUN 9; Creatinine, Ser 0.85; Hemoglobin 12.1; Platelets 311; Potassium 3.9; Sodium 138 09/13/2021: Magnesium 1.8; TSH 0.901  Recent Lipid Panel    Component Value Date/Time   CHOL 238 (H) 06/20/2020 1142   TRIG 86.0 06/20/2020 1142   HDL 76.10 06/20/2020 1142   CHOLHDL 3 06/20/2020 1142   VLDL 17.2 06/20/2020 1142   LDLCALC 145 (H) 06/20/2020 1142     Risk Assessment/Calculations:        Physical Exam:    VS:  LMP 06/25/2006 (LMP Unknown)     Wt Readings from Last 3 Encounters:  01/01/22 150 lb (68 kg)  10/16/21 150 lb (68 kg)  07/25/21 158 lb (71.7 kg)     GEN:  Well nourished,  chronically ill appearing in no acute distress HEENT: Normal NECK: No JVD; No carotid bruits CARDIAC: Irregular RR, no murmurs, rubs, gallops RESPIRATORY:  Clear to auscultation without rales, wheezing or rhonchi  ABDOMEN: Soft, non-tender, non-distended MUSCULOSKELETAL:  No edema; No deformity. 2+  pedal pulses, equal bilaterally  SKIN: Warm and dry NEUROLOGIC:  Alert and oriented x 3 PSYCHIATRIC:  Normal affect   EKG:  EKG is ***   Diagnoses:    No diagnosis found.  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 No indication for further testing at this time.   Palpitaitons/Frequent PACs: Frequent PACs on EKG today almost in a trigeminy pattern. She reports occasional palpitations. We will place a 14 day monitor for further evaluation. Will check TSH and magnesium to determine if abnormal levels are contributing to frequency of PACs. Would favor addition of beta blocker or CCB if high PAC burden. BP is a little soft, may need to reduce amlodipine.   Essential hypertension: BP is well-controlled, a little of the soft side. Rarely monitors BP at home. As noted above, if addition of beta blocker or CCB is warranted, will reduce amlodipine.   Chronic HFpEF: LVEF 54-65%,KPTWSF diastolic parameters by echo 03/2021, previously impaired LV diastolic filling on echo 68/1275. She denies dyspnea, orthopnea, PND, edema. She appears euvolemic on exam today.  Will await monitor results for further medication changes.      Disposition: ***  Medication Adjustments/Labs and Tests Ordered: Current medicines are reviewed at length with the patient today.  Concerns regarding medicines are outlined above.  No orders of the defined types were placed in this encounter.  No orders of the defined types were placed in this encounter.   There are no Patient Instructions on file for this visit.    Signed, Emmaline Life, NP  05/11/2022 6:37 AM    [employer]

## 2022-05-14 ENCOUNTER — Ambulatory Visit (INDEPENDENT_AMBULATORY_CARE_PROVIDER_SITE_OTHER): Payer: Medicare Other | Admitting: Pharmacist

## 2022-05-14 DIAGNOSIS — E785 Hyperlipidemia, unspecified: Secondary | ICD-10-CM

## 2022-05-14 DIAGNOSIS — I10 Essential (primary) hypertension: Secondary | ICD-10-CM

## 2022-05-14 NOTE — Progress Notes (Signed)
Chronic Care Management Pharmacy Note  05/02/21 Name:  IREANNA FINLAYSON MRN:  [id number] DOB:  08-09-1955  Summary: LDL above goal < 70 BP low per home readings  Recommendations/Changes made from today's visit: -Recommend initiating rosuvastatin 20 mg daily for LDL lowering and secondary stroke prevention and reached out to cardiology to discuss -Recommended bringing BP cuff to cardiology office visit to ensure accuracy  Plan: Follow up BP assessment in 2-3 months Scheduled CPE for 2 months  Subjective: LYNNANN KNUDSEN is an 66 y.o. year old female who is a primary patient of [name], MD.  The CCM team was consulted for assistance with disease management and care coordination needs.    Engaged with patient by telephone for follow up visit in response to provider referral for pharmacy case management and/or care coordination services.   Consent to Services:  The patient was given information about Chronic Care Management services, agreed to services, and gave verbal consent prior to initiation of services.  Please see initial visit note for detailed documentation.   Patient Care Team: [name], MD as PCP - General Tamala Julian Lynnell Dike, MD as PCP - Cardiology (Cardiology) Gatha Mayer, MD as Consulting Physician (Gastroenterology) Gavin Pound, MD as Consulting Physician (Rheumatology) Viona Gilmore, [facility] as Pharmacist (Pharmacist)  Recent office visits: 04/23/22 [name] MD: Patient presented for video visit for chronic narcotic use. Refilled hydrocodone -acetaminophen 10-32m.   01/19/2022 SAlysia PennaMD - Patient was seen for Chronic narcotic use additional concerns. Started Ondansetron 8 mg q 8 hours prn. Changed Norco 10/325 mg to Take 1 tablet by mouth every 6 (six) hours as needed for moderate pain., Starting Sat 03/31/2022, Until Mon 04/30/2022. No follow up noted.    Recent consult visits: 04/09/22 MLeafy Kindle(Rheumatology): Patient presented for RA  follow up. Unable to access notes.  04/02/22 DPietro Cassis PA-C (ENT): Patient presented for tympanic membrane perforation follow up.   02/28/22 KMarisue Brooklyn RN (infusion center): Patient presented for Reclast infusion.   01/31/2022 DPietro CassisPA-C (ENT) - Patient was seen for Tympanic membrane perforation, left and an additional concern. Increase mupirocin ointment to 4 times daily. Return to clinic 4 to 6 weeks.   [facility]: [facility] due to hematuria.  [facility] started Medication Changes at Hospital Discharge: No medications changed  Medications Discontinued at Hospital Discharge: No medications discontinued Medications that remain the same after Hospital Discharge:??  -All other medications will remain the same.       [facility] due to right lower quadrant abdominal pain.  [facility] Medication Changes at Hospital Discharge: No medications changed  Medications Discontinued at Hospital Discharge: No medications discontinued Medications that remain the same after Hospital Discharge:??  -All other medications will remain the same.    Objective:  Lab Results  Component Value Date   CREATININE 0.85 07/10/2021   BUN 9 07/10/2021   GFR 107.53 04/20/2019   GFRNONAA >60 07/10/2021   GFRAA 98 11/19/2017   NA 138 07/10/2021   K 3.9 07/10/2021   CALCIUM 9.3 07/10/2021   CO2 25 07/10/2021   GLUCOSE 91 07/10/2021    Lab Results  Component Value Date/Time   HGBA1C 5.6 06/09/2015 08:30 PM   HGBA1C 5.6 09/26/2009 03:53 PM   GFR 107.53 04/20/2019  12:02 PM   GFR 103.39 12/20/2014 11:31 AM    Last diabetic Eye exam: No results found for: "HMDIABEYEEXA"  Last diabetic Foot exam: No results found for: "HMDIABFOOTEX"    Lab Results  Component Value Date   CHOL 238 (H) 06/20/2020   HDL 76.10 06/20/2020   LDLCALC 145 (H) 06/20/2020   TRIG 86.0 06/20/2020   CHOLHDL 3 06/20/2020       Latest Ref Rng & Units 07/10/2021   11:23 AM 07/06/2020    3:18 AM 07/05/2020    4:00 AM  Hepatic Function  Total Protein 6.5 - 8.1 g/dL 7.2  6.5  6.5   Albumin 3.5 - 5.0 g/dL 3.9  3.4  3.5   AST 15 - 41 U/L 24  35  28   ALT 0 - 44 U/L _0 Alk Phosphatase 38 - 126 U/L 95  65  68   Total Bilirubin 0.3 - 1.2 mg/dL 1.1  0.6  0.6     Lab Results  Component Value Date/Time   TSH 0.901 09/13/2021 12:27 PM   TSH 1.93 06/20/2020 11:42 AM   FREET4 0.94 06/20/2020 11:42 AM       Latest Ref Rng & Units 07/10/2021   11:23 AM 07/06/2020    3:18 AM 07/05/2020    4:00 AM  CBC  WBC 4.0 - 10.5 K/uL 6.7  4.2  5.1   Hemoglobin 12.0 - 15.0 g/dL 12.1  12.3  11.6   Hematocrit 36.0 - 46.0 % 36.5  38.5  36.4   Platelets 150 - 400 K/uL 311  206  221     Lab Results  Component Value Date/Time   VD25OH 74.85 12/20/2020 10:32 AM   VD25OH 66.24 12/21/2019 03:04 PM    Clinical ASCVD: Yes  The ASCVD Risk score (Arnett DK, et al., 2019) failed to calculate for the following reasons:   The patient has a prior MI or stroke diagnosis       01/01/2022   11:25 AM 10/16/2021    3:13 PM 08/18/2021    1:48 PM  Depression screen PHQ 2/9  Decreased Interest 0 0 0  Down, Depressed, Hopeless 0 0 0  PHQ - 2 Score 0 0 0  Altered sleeping 1    Tired, decreased energy 0    Change in appetite 0    Feeling bad or failure about yourself  0    Trouble concentrating 0    Moving slowly or fidgety/restless 0    Suicidal thoughts 0    PHQ-9 Score 1    Difficult doing work/chores Not difficult at all       Social History   Tobacco Use  Smoking Status Former   Packs/day: 1.00   Types: Cigarettes   Quit date: 11/27/2012   Years since quitting: 9.4  Smokeless Tobacco Never   BP Readings from Last 3 Encounters:  01/01/22 120/74   09/13/21 118/60  07/25/21 138/80   Pulse Readings from Last 3 Encounters:  01/01/22 66  09/13/21 90  07/25/21 94   Wt Readings from Last 3 Encounters:  01/01/22 150 lb (68 kg)  10/16/21 150 lb (68 kg)  07/25/21 158 lb (71.7 kg)   BMI Readings from Last 3 Encounters:  01/01/22 22.81 kg/m  10/16/21 22.81 kg/m  09/13/21 24.02 kg/m    Assessment/Interventions: Review of patient past medical history, allergies, medications, health status, including review of consultants reports, laboratory and other test data, was performed as part  of comprehensive evaluation and provision of chronic care management services.   SDOH:  (Social Determinants of Health) assessments and interventions performed: Yes (last 08/18/21) SDOH Interventions    Flowsheet Row Chronic Care Management from 08/18/2021 in [place] at [place] Management from 03/09/2020 in [place] at [place] Management from 11/09/2019 in [place] at [place] Interventions Intervention Not Indicated -- --  Housing Interventions Intervention Not Indicated -- --  Transportation Interventions Intervention Not Indicated Intervention Not Indicated Intervention Not Indicated  Financial Strain Interventions Intervention Not Indicated Intervention Not Indicated Intervention Not Indicated  Stress Interventions Intervention Not Indicated -- --      SDOH Screenings   Food Insecurity: No Food Insecurity (10/16/2021)  Housing: Low Risk  (08/18/2021)  Transportation Needs: No Transportation Needs (10/16/2021)  Depression (PHQ2-9): Low Risk  (01/01/2022)  Financial Resource Strain: Low Risk  (10/16/2021)  Physical Activity: Inactive (10/16/2021)  Social Connections: Unknown (10/05/2020)  Stress: Stress Concern Present (10/16/2021)  Tobacco Use: Medium Risk (04/23/2022)    CCM Care Plan  Allergies  Allergen Reactions   Spinach Itching, Rash and  Other (See Comments)    Welts.    Medications Reviewed Today     Reviewed by Viona Gilmore, [facility] (Pharmacist) on 05/14/22 at 1545  Med List Status: <None>   Medication Order Taking? Sig Documenting Provider Last Dose Status Informant  Abatacept 125 MG/ML SOSY [id number]  Inject 125 mg into the skin once a week. Has on Sunday [provider]  Active Child           Med Note Juanetta Beets, [facility] R   Tue Aug 07, 2016  8:11 PM)    albuterol (VENTOLIN HFA) 108 [phone] Base) MCG/ACT inhaler [id number]  INHALE 1 PUFF INTO THE LUNGS EVERY 4 HOURS AS NEEDED FOR WHEEZING OR SHORTNESS OF Damian Leavell, MD  Active   allopurinol (ZYLOPRIM) 100 MG tablet [id number]  TAKE 1 TABLET BY MOUTH EVERY DAY [name], MD  Active   amLODipine (NORVASC) 5 MG tablet [id number]  TAKE 1 TABLET(5 MG) BY MOUTH DAILY [name], MD  Active   aspirin EC 81 MG tablet [id number]  Take 1 tablet (81 mg total) by mouth daily. Verlee Monte, MD  Active Child  CALCIUM PO [id number] Yes Take 1 tablet by mouth 2 (two) times daily. 600 mg of calcium with vitamin D [provider] Taking Active Child  cetirizine (ZYRTEC) 10 MG tablet [id number]  Take 1 tablet (10 mg total) by mouth daily. [name], MD  Active Child  Cholecalciferol (VITAMIN D) 2000 units CAPS [id number] Yes Take 2,000 Units by mouth daily. [provider] Taking Active Child  ciprofloxacin-dexamethasone (CIPRODEX) OTIC suspension [id number]  Place 4 drops into both ears 2 (two) times daily. [name], MD  Active   clobetasol ointment (TEMOVATE) 0.05 % [id number]  Apply 1 application topically 2 (two) times daily. [name], MD  Active   diazepam (VALIUM) 2 MG tablet [id number]  Take 1 tablet (2 mg total) by mouth every 12 (twelve) hours as needed for anxiety. [name], MD  Active   diclofenac sodium (VOLTAREN) 1 % GEL [id number]  Apply 1 application topically 4 (four) times daily as needed (arthritis). [provider]  Active Child  docusate sodium (COLACE) 100 MG capsule [id number]  Take 100 mg by mouth 2 (two) times daily. [provider]  Active Child  esomeprazole (NEXIUM) 40 MG capsule [id number]  TAKE 1 CAPSULE(40 MG) BY MOUTH DAILY IN THE AFTERNOON [name], MD  Active   folic acid (FOLVITE) 569 MCG tablet [id number]  Take 400 mcg by mouth daily. [provider]  Active Child  HYDROcodone-acetaminophen (NORCO) 10-325 MG tablet [id number]  Take 1 tablet by mouth every 6 (six) hours as needed for moderate pain. [name], MD  Active   ketoconazole (NIZORAL) 2 % shampoo [id number]  Apply 1 application topically 2 (two) times a week. [provider]  Active Child  megestrol (MEGACE ES) 625 MG/5ML suspension [id number]  SHAKE LIQUID AND TAKE 5 ML BY MOUTH THREE TIMES DAILY BEFORE MEALS AS NEEDED FOR APPETITE [name], MD  Active   Methotrexate Sodium (METHOTREXATE, PF,) 50 MG/2ML injection [id number]  Inject 0.5 mLs into the muscle once a week. Sunday [provider]  Active Child           Med Note Vonzella Nipple, [place] S   Fri Jun 29, 2016  5:35 PM)    metoprolol succinate (TOPROL-XL) 25 MG 24 hr tablet [id number]  Take 1 tablet (25 mg total) by mouth daily. [name], MD  Active   Multiple Vitamin (MULTIVITAMIN WITH MINERALS) TABS tablet [id number] Yes Take 1 tablet by mouth daily. [provider] Taking Active Child  mupirocin ointment (BACTROBAN) 2 % [id number] Yes Apply 1 Application topically 2 (two) times daily. [name], MD Taking Active   ondansetron (ZOFRAN-ODT) 8 MG disintegrating tablet [id number]  Take 1 tablet (8 mg total) by mouth every 8 (eight) hours as needed for nausea or vomiting. [name], MD  Active   PERIDEX 0.12 % solution [id number]  Use as directed 15 mLs in the mouth or throat 2 (two) times daily.  [provider]  Active Child  polyethylene glycol (MIRALAX / GLYCOLAX) 17 g packet [id number] Yes Take 17  g by mouth daily as needed for mild constipation. [name], MD Taking Active   tiZANidine (ZANAFLEX) 4 MG tablet [id number]  TAKE 1 TABLET(4 MG) BY MOUTH TWICE DAILY [name], MD  Active   traMADol (ULTRAM) 50 MG tablet [id number]  TAKE 1 TO 2 TABLETS(50 TO 100 MG) BY MOUTH EVERY 6 HOURS AS NEEDED [name], MD  Active   vitamin B-12 (CYANOCOBALAMIN) 500 MCG tablet [id number] Yes Take 500 mcg by mouth daily. [provider] Taking Active Child  vitamin C (ASCORBIC ACID) 500 MG tablet [id number] Yes Take 500 mg by mouth daily.  [provider] Taking Active Child  zoledronic acid (RECLAST) 5 MG/100ML SOLN injection [id number] Yes Inject 5 mg into the vein once. 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   Chronic diastolic CHF (congestive heart failure) ([place])    Ventral hernia without obstruction or gangrene 06/06/2015   Renal mass    Pulmonary HTN ([place]) 01/01/2014   AKI (acute kidney injury) ([place]) 01/01/2014   Iron deficiency anemia 01/01/2014   Atrial flutter ([place]) 12/31/2013   Encounter for ostomy care education 11/30/2013   Stoma dermatitis 11/30/2013  Incisional hernia 11/30/2013   Constipation, chronic 11/30/2013   Charcot's joint of foot 07/07/2013   Bilateral leg edema 04/20/2013   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 11/28/2012 01/05/2013   Rheumatoid arthritis ([place]) 06/07/2009   THYROID NODULE 07/01/2007   LUNG NODULE 07/01/2007   Osteoporosis 05/07/2007   ABUSE, ALCOHOL, 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/12/2007   Blindness 04/12/2007   HEMORRHOIDS, INTERNAL  04/12/2007   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/14/2007   Hyperlipidemia 01/07/2007   Essential hypertension 01/07/2007   GERD 01/07/2007   Marfan's syndrome 01/07/2007   Stroke ([place]) 07/2006   Chronic gout 2008    Immunization History  Administered Date(s) Administered   DTaP 10/07/2005   Fluad Quad(high Dose 65+) 04/20/2019   Influenza Split 04/30/2011, 04/16/2012   Influenza Whole 04/25/2002, 04/03/2010   Influenza, High Dose Seasonal PF 04/09/2022   Influenza,inj,Quad PF,6+ Mos 03/23/2013, 05/09/2015, 04/15/2017, 04/21/2018, 06/20/2020   Influenza-Unspecified 04/26/2014, 04/24/2021   Pneumococcal Conjugate-13 07/18/2015   Pneumococcal Polysaccharide-23 04/25/2002, 04/03/2010   Tdap 09/04/2010   Patient she feels fine with these low BP readings but does see them a few times a week. She did bring her cuff to an office visit a while ago and they didn't verify it. Patient has a visit with cardiology tomorrow and will bring it with her then.  Patient stated she had her flu shot earlier this year at [facility]. She wasn't sure of the exact date.  Conditions to be addressed/monitored:  Hypertension, Hyperlipidemia, GERD, Osteoporosis, Gout and RA and pain  Conditions addressed this visit: Hypertension, hyperlipidemia  Care Plan : CCM Pharmacy Care Plan  Updates made by Viona Gilmore, [place] since 05/16/2022 12:00 AM     Problem: Problem: Hypertension, Hyperlipidemia, GERD, Osteoporosis, Gout and RA and pain      Long-Range Goal: Patient-Specific Goal   Start Date: 11/08/2020  Expected End Date: 11/08/2021  Recent Progress: On track  Priority: High  Note:   Current Barriers:  Unable to independently monitor therapeutic efficacy Unable to achieve control of cholesterol  Unable to maintain control of blood pressure  Pharmacist Clinical Goal(s):  Patient will achieve adherence to monitoring guidelines and medication adherence to achieve therapeutic efficacy achieve control of  cholesterol as evidenced by next lipid panel  through collaboration with PharmD and provider.   Interventions: 1:1 collaboration with [name], MD regarding development and update of comprehensive plan of care as evidenced by provider attestation and co-signature Inter-disciplinary care team collaboration (see longitudinal plan of care) Comprehensive medication review performed; medication list updated in electronic medical record  Hypertension (BP goal <130/80) -Controlled -Current treatment: Amlodipine 5 mg 1 tablet once daily - Appropriate, Query effective, Safe, Accessible Metoprolol succinate 25 mg 1 tablet daily - Appropriate, Query effective, Safe, Accessible -Medications previously tried: metoprolol (patient preference)  -Current home readings: always < 120/80; some 97/50, 99/50 (wrist BP cuff and has not brought into office to verify accuracy) -Current exercise habits: limited with use of a wheelchair -Denies hypotensive/hypertensive symptoms -Educated on BP goals and benefits of medications for prevention of heart attack, stroke and kidney damage; Importance of home blood pressure monitoring; Proper BP monitoring technique; -Counseled to monitor BP at home weekly, document, and provide log at future appointments -Counseled on diet and exercise extensively Recommended to continue current medication Recommended bringing cuff to cardiology visit tomorrow.  Hyperlipidemia: (LDL goal < 70) -Uncontrolled -Current treatment: No medications -Medications previously tried: simvastatin (myalgias)   -Current dietary patterns: uses oil when cooking; eating a lot of fried  fast food due to kids brining her food and she cannot cook -Current exercise habits: limited -Educated on Cholesterol goals;  Benefits of statin for ASCVD risk reduction; Importance of limiting foods high in cholesterol; -Recommended starting statin therapy based on history of TIA and LDL goal < 70.  History of  TIA (Goal: prevent future events) -Uncontrolled -Current treatment  Aspirin 39m, 1 tablet once daily - Appropriate, Effective, Safe, Accessible -Medications previously tried: none  -Recommended to continue current medication   Osteoporosis (Goal prevent fractures) -Not ideally controlled -Last DEXA Scan: 10/05/19  T-Score femoral neck: -1.9, -2.9  T-Score total hip: n/a  T-Score lumbar spine: n/a  T-Score forearm radius: -4.2  10-year probability of major osteoporotic fracture: n/a  10-year probability of hip fracture: n/a -Patient is a candidate for pharmacologic treatment due to T-Score < -2.5 in femoral neck -Current treatment  Prolia injection every 6 months (started 02/2020)  - Appropriate, Effective, Safe, Accessible Cholecalciferol (vitamin D) 2000 units, 1 capsule once daily - Appropriate, Effective, Safe, Accessible Calcium 1200 mg, 1 tablet twice daily - Appropriate, Effective, Safe, Accessible -Medications previously tried: none  -Recommend [phone] units of vitamin D daily. Recommend 1200 mg of calcium daily from dietary and supplemental sources. Recommend weight-bearing and muscle strengthening exercises for building and maintaining bone density. -Counseled on diet and exercise extensively Recommended to continue current medication Recommended reminding dentist about Prolia therapy.  Gout (Goal: prevent flare ups and uric acid < 6) -Controlled -Current treatment  Allopurinol 1017m 1 tablet once daily  - Appropriate, Effective, Safe, Accessible -Medications previously tried: none  -Recommended to continue current medication  GERD (Goal: minimize symptoms) -Controlled -Current treatment  Esomeprazole 4094m1 capsule once daily - Appropriate, Effective, Query Safe, Accessible -Medications previously tried: none  -Counseled on non-pharmacological interventions for acid reflux. Take measures to prevent acid reflux, such as avoiding spicy foods, avoiding caffeine, avoid  laying down a few hours after eating, and raising the head of the bed.  Rheumatoid arthritis (Goal: minimize symptoms) -Controlled -Current treatment  Methotrexate injection, inject 0.5MLs into muscle every Monday - Appropriate, Effective, Safe, Accessible Folic acid [id number] tablet once daily - Appropriate, Effective, Safe, Accessible Orencia 125m47mnject 125mg39mry Monday - Appropriate, Effective, Safe, Accessible -Medications previously tried: n/a  -Recommended to continue current medication  Pain (Goal: minimize pain) -Not ideally controlled -Current treatment  Hydrocodone/ APAP 10/325mg,47mablet every six hours as needed for moderate pain (will take if tramadol doesn't work) - Appropriate, Effective, Query Safe, Accessible Diclofenac 1% gel, apply topically four times daily as needed - Appropriate, Effective, Safe, Accessible Tramadol 50mg, 18m 2 tablets every six hours as needed  (takes at least 2x/ day) - Appropriate, Effective, Query Safe, Accessible Tizanidine 4mg, 1 65mlet twice daily - Appropriate, Effective, Safe, Accessible -Medications previously tried: n/a  -Recommended to continue current medication   Health Maintenance -Vaccine gaps: shingles, tetanus, COVID  -Current therapy:  Cetirizine 10mg, 1 64met once daily Docusate 100mg, 1 c32mle twice daily  keoconazole 2% cream, apply once daily for irritation Metoclopramide 10mg,1 tab55mevery six hours  Multivitamin tablets, 1 tablet once daily Miralax, 17 g daily as needed for constipation ( not everyday bc does not have someone to help change bag)  Triamcinolone 00025% cream, apply daily as needed for itching Vitamin B12 500mcg, 1 ta53m once daily Vitamin C 500mg, 1 tabl61mnce daily  -Educated on Cost vs benefit of each product must be carefully weighed by individual consumer -Patient is  satisfied with current therapy and denies issues -Recommended to continue current medication  Patient Goals/Self-Care  Activities Patient will:  - take medications as prescribed check blood pressure weekly, document, and provide at future appointments target a minimum of 150 minutes of moderate intensity exercise weekly  Follow Up Plan: Telephone follow up appointment with care management team member scheduled for: 6 months       Medication Assistance: None required.  Patient affirms current coverage meets needs.  Compliance/Adherence/Medication fill history: Care Gaps: Eye exam, foot exam, shingrix, tetanus, A1c, Prevnar 20, Hep C screening, urine microalbumin Last BP - 120/74 on 01/01/2022   Star-Rating Drugs: None  Patient's preferred pharmacy is:  [facility], [place] [address] AT [address] [place] [place] [address] Phone: [phone] Fax: [phone]  [id number] - [place], [address] AT [place] [address] Phone: [phone] Fax: [phone]  [facility] [place], [address] [place] [address] Phone: [phone] Fax: [phone]   Uses pill box? Yes  Pt endorses 99% compliance  We discussed: Benefits of medication synchronization, packaging and delivery as well as enhanced pharmacist oversight with [employer]. Patient decided to: Continue current medication management strategy  Care Plan and Follow Up Patient Decision:  Patient agrees to Care Plan and Follow-up.  Plan: Telephone follow up appointment with care management team member scheduled for:  6 months  [name], PharmD [place] Pharmacist [facility] [place] [phone]

## 2022-05-15 ENCOUNTER — Ambulatory Visit: Payer: Medicare Other | Admitting: Nurse Practitioner

## 2022-05-24 DIAGNOSIS — E785 Hyperlipidemia, unspecified: Secondary | ICD-10-CM

## 2022-05-24 DIAGNOSIS — I10 Essential (primary) hypertension: Secondary | ICD-10-CM

## 2022-06-04 DIAGNOSIS — Z933 Colostomy status: Secondary | ICD-10-CM | POA: Diagnosis not present

## 2022-06-12 DIAGNOSIS — Z1231 Encounter for screening mammogram for malignant neoplasm of breast: Secondary | ICD-10-CM | POA: Diagnosis not present

## 2022-06-20 ENCOUNTER — Telehealth: Payer: Self-pay | Admitting: Family Medicine

## 2022-06-20 NOTE — Telephone Encounter (Signed)
Attempted to call pt but no option to leave message, will try again later

## 2022-06-20 NOTE — Telephone Encounter (Signed)
Patient requesting a letter to excuse her form [employer] [employer] on Feb 6

## 2022-06-20 NOTE — Telephone Encounter (Signed)
The letter is ready  

## 2022-06-21 NOTE — Telephone Encounter (Signed)
Spoke with pt regarding [employer] letter, advised to pick up letter from the office, stated to mail it out to the address in pt chart. Letter mailed out on 06/21/22

## 2022-06-25 DIAGNOSIS — K5720 Diverticulitis of large intestine with perforation and abscess without bleeding: Secondary | ICD-10-CM | POA: Diagnosis not present

## 2022-06-25 DIAGNOSIS — M0549 Rheumatoid myopathy with rheumatoid arthritis of multiple sites: Secondary | ICD-10-CM | POA: Diagnosis not present

## 2022-06-25 DIAGNOSIS — J449 Chronic obstructive pulmonary disease, unspecified: Secondary | ICD-10-CM | POA: Diagnosis not present

## 2022-06-25 DIAGNOSIS — M069 Rheumatoid arthritis, unspecified: Secondary | ICD-10-CM | POA: Diagnosis not present

## 2022-06-25 DIAGNOSIS — R5381 Other malaise: Secondary | ICD-10-CM | POA: Diagnosis not present

## 2022-06-29 DIAGNOSIS — R5381 Other malaise: Secondary | ICD-10-CM | POA: Diagnosis not present

## 2022-06-29 DIAGNOSIS — M069 Rheumatoid arthritis, unspecified: Secondary | ICD-10-CM | POA: Diagnosis not present

## 2022-06-29 DIAGNOSIS — M0549 Rheumatoid myopathy with rheumatoid arthritis of multiple sites: Secondary | ICD-10-CM | POA: Diagnosis not present

## 2022-06-29 DIAGNOSIS — K5720 Diverticulitis of large intestine with perforation and abscess without bleeding: Secondary | ICD-10-CM | POA: Diagnosis not present

## 2022-06-30 NOTE — Progress Notes (Unsigned)
Cardiology Office Note:    Date:  07/02/2022   ID:  TRACI GAFFORD, DOB 15-Jan-1956, MRN [id number]  PCP:  [name], MD   [facility] Providers Cardiologist:  Sinclair Grooms, MD     Referring MD: [name], MD   Chief Complaint: follow-up HFpEF, palpitations  History of Present Illness:    [name] is a 67 y.o. female with a hx of HTN, chronic HFpEF, tachycardia induced cardiomyopathy (now recovered), legal blindness, atrial flutter s/p ablation 2015, stroke (chronically disabled), RA, HTN, prior history of alcohol abuse, first degree AV block, and possible Marfan's syndrome with significant MSK abnormality and joint effusion.   She underwent successful catheter ablation of 2 distinct reentrant circuits by Dr. Lovena Le due to incessant atrial flutter in 2015. She returned in 10/2016 with concerns for palpitations. Cardiac monitor revealed NSR and sinus tachycardia, rare PACs.   She was last seen in our office on 04/04/20 by Dr. Tamala Julian. No medication changes were made and one year follow-up was recommended. He recommended repeating echo for evaluation of aortic root. Echo 03/2021 revealed LVEF 50-55%, aortic size normal, mild to moderate mitral valve leak.   Seen by  me on 09/13/21 accompanied by her daughter, traveling in a wheelchair. Not very physically active. She denied chest pain, shortness of breath, lower extremity edema, fatigue,  melena, hematuria, hemoptysis, diaphoresis, weakness, presyncope, syncope, orthopnea, and PND. Reported occasional palpitations that are burdensome to her. Frequent PACs seen on EKG. She had normal TSH and magnesium levels at that time. Cardiac monitor revealed NSR with average HR 82 bpm, self terminating WCT occurred on 5 occasions, longest lasting 8.5 sec. SVT longest lasting 16 sec, PAC burden 8.7%, no a fib, rare 2nd degree AVB during sleep, She declined need to start BB medication for symptoms.   Message from [name], [facility] with  PCP that patient was advised to bring home BP cuff to office visit for calibration.   Today, she is here with her daughter. Reports she is doing well. She did not bring her BP cuff today. Reports home BP always < 120/80. Occasionally wakes up with tightness across her chest in the mornings. No associated n/v, shortness of breath, or diaphoresis. Chest tightness does not occur at other times. She denies shortness of breath, lower extremity edema, fatigue, palpitations, melena, hematuria, hemoptysis, diaphoresis, weakness, presyncope, syncope, orthopnea, and PND. Daughter thinks she has white coat hypertension because BP is consistently elevated at appointments but is normal at home. No specific cardiac concerns today.   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   bilateral     Past Surgical History:  Procedure Laterality Date   ABLATION  01/04/14   atrial flutter ablation (2 circuits) by Dr Lovena Le   ATRIAL FLUTTER ABLATION N/A 01/04/2014   Procedure: ATRIAL FLUTTER ABLATION;  Surgeon: Evans Lance, MD;  Location: [facility] CATH LAB;  Service: Cardiovascular;  Laterality: N/A;   BUNIONECTOMY Bilateral 1/10   COLON SURGERY     colectomy    COLOSTOMY  11/27/2012   HERNIA REPAIR     IR RADIOLOGIST EVAL & MGMT  10/01/2017   IR RADIOLOGIST EVAL & MGMT  11/21/2017   IR RADIOLOGIST EVAL & MGMT  02/13/2018   IR RADIOLOGIST EVAL & MGMT  01/08/2019   IR RADIOLOGIST EVAL & MGMT  12/29/2019   IR RADIOLOGIST EVAL & MGMT  01/31/2021   IR RADIOLOGIST EVAL & MGMT  12/20/2021   LAPAROSCOPIC ABDOMINAL EXPLORATION N/A 01/06/2013   Procedure: LAPAROSCOPIC ABDOMINAL EXPLORATION;  Surgeon: Adin Hector, MD;  Location: [facility] ORS;  Service: General;  Laterality: N/A;   LAPAROSCOPIC LYSIS OF ADHESIONS N/A 01/06/2013   Procedure: LAPAROSCOPIC LYSIS OF ADHESIONS/ INTEROTOMY REPAIR;  Surgeon: Adin Hector, MD;  Location: [facility] ORS;  Service: General;  Laterality: N/A;   LAPAROTOMY N/A 11/27/2012   Procedure: EXPLORATORY LAPAROTOMY SIGMOID COLECTOMY, COLOSTOMY;  Surgeon: Madilyn Hook, DO;  Location: [facility] ORS;  Service: General;  Laterality: N/A;   RADIOLOGY WITH ANESTHESIA Left 10/23/2017   Procedure: CT RENAL CRYO AND BIOPSY;  Surgeon: Aletta Edouard, MD;  Location: [facility] ORS;  Service: Radiology;  Laterality: Left;   TOTAL KNEE ARTHROPLASTY Bilateral 6/08    Current Medications: Current Meds  Medication Sig   Abatacept 125 MG/ML SOSY Inject 125 mg into the skin once a week. Has on Sunday   albuterol (VENTOLIN HFA) 108 (90 Base) MCG/ACT inhaler INHALE 1 PUFF INTO THE LUNGS EVERY 4 HOURS AS NEEDED FOR WHEEZING OR SHORTNESS OF BREATH   allopurinol (ZYLOPRIM) 100 MG tablet TAKE 1 TABLET BY MOUTH EVERY DAY   aspirin EC 81 MG tablet Take 1 tablet (81 mg total) by mouth daily.   CALCIUM PO Take 1 tablet by mouth 2 (two) times daily. 600 mg of calcium with vitamin D   cetirizine (ZYRTEC) 10 MG tablet Take 1 tablet (10 mg total) by mouth daily.   Cholecalciferol (VITAMIN D) 2000 units CAPS Take 2,000 Units by mouth daily.   ciprofloxacin-dexamethasone (CIPRODEX) OTIC suspension Place 4 drops into both ears 2 (two) times daily.   clobetasol ointment (TEMOVATE) 8.10 % Apply 1  application topically 2 (two) times daily.   diazepam (VALIUM) 2 MG tablet Take 1 tablet (2 mg total) by mouth every 12 (twelve) hours as needed for anxiety.   diclofenac sodium (VOLTAREN) 1 % GEL Apply 1 application topically 4 (four) times daily as needed (arthritis).   docusate sodium (COLACE) 100 MG capsule Take 100 mg by mouth 2 (two) times daily.   esomeprazole (NEXIUM) 40 MG capsule TAKE 1 CAPSULE(40 MG) BY MOUTH DAILY IN THE AFTERNOON   folic acid (FOLVITE) 175 MCG tablet Take 400 mcg by mouth daily.   HYDROcodone-acetaminophen (NORCO) 10-325 MG tablet Take 1 tablet by mouth every 6 (six) hours as needed for moderate pain.   ketoconazole (NIZORAL) 2 % shampoo Apply 1 application topically 2 (two) times a week.   megestrol (MEGACE ES) 625 MG/5ML suspension SHAKE LIQUID AND TAKE 5 ML BY MOUTH THREE TIMES DAILY BEFORE  MEALS AS NEEDED FOR APPETITE   Methotrexate Sodium (METHOTREXATE, PF,) 50 MG/2ML injection Inject 0.5 mLs into the muscle once a week. Sunday   metoprolol succinate (TOPROL-XL) 25 MG 24 hr tablet Take 1 tablet (25 mg total) by mouth daily.   Multiple Vitamin (MULTIVITAMIN WITH MINERALS) TABS tablet Take 1 tablet by mouth daily.   mupirocin ointment (BACTROBAN) 2 % Apply 1 Application topically 2 (two) times daily.   ondansetron (ZOFRAN-ODT) 8 MG disintegrating tablet Take 1 tablet (8 mg total) by mouth every 8 (eight) hours as needed for nausea or vomiting.   PERIDEX 0.12 % solution Use as directed 15 mLs in the mouth or throat 2 (two) times daily.    polyethylene glycol (MIRALAX / GLYCOLAX) 17 g packet Take 17 g by mouth daily as needed for mild constipation.   predniSONE (DELTASONE) 5 MG tablet Take 5-10 mg by mouth daily as needed.   temazepam (RESTORIL) 30 MG capsule 1 capsule at bedtime as needed   tiZANidine (ZANAFLEX) 4 MG tablet TAKE 1 TABLET(4 MG) BY MOUTH TWICE DAILY   traMADol (ULTRAM) 50 MG tablet TAKE 1 TO 2 TABLETS(50 TO 100 MG) BY MOUTH EVERY 6 HOURS AS NEEDED    triamcinolone lotion (KENALOG) 0.1 % APPLY TOPICALLY TO THE AFFECTED AREA TWICE DAILY AS NEEDED FOR ITCHING   vitamin B-12 (CYANOCOBALAMIN) 500 MCG tablet Take 500 mcg by mouth daily.   vitamin C (ASCORBIC ACID) 500 MG tablet Take 500 mg by mouth daily.      Allergies:   Spinach   Social History   Socioeconomic History   Marital status: Single    Spouse name: Not on file   Number of children: 1   Years of education: Not on file   Highest education level: Not on file  Occupational History   Not on file  Tobacco Use   Smoking status: Former    Packs/day: 1.00    Types: Cigarettes    Quit date: 11/27/2012    Years since quitting: 9.6   Smokeless tobacco: Never  Vaping Use   Vaping Use: Former  Substance and Sexual Activity   Alcohol use: No    Alcohol/week: 0.0 standard drinks of alcohol    Comment: recovering alcoholic sober since 6720   Drug use: Yes    Types: Hydrocodone   Sexual activity: Not Currently    Partners: Male    Birth control/protection: None  Other Topics Concern   Not on file  Social History Narrative   Not on file   Social Determinants of Health   Financial Resource Strain: Low Risk  (10/16/2021)   Overall Financial Resource Strain (CARDIA)    Difficulty of Paying Living Expenses: Not hard at all  Food Insecurity: No Food Insecurity (10/16/2021)   Hunger Vital Sign    Worried About Running Out of Food in the Last Year: Never true    [place] in the Last Year: Never true  Transportation Needs: No Transportation Needs (10/16/2021)   PRAPARE - Hydrologist (Medical): No    Lack of Transportation (Non-Medical): No  Physical Activity: Inactive (10/16/2021)   Exercise Vital Sign    Days of Exercise per Week: 0 days    Minutes of Exercise per Session: 0 min  Stress: Stress Concern Present (10/16/2021)   [place]    Feeling of Stress : To some extent   Social Connections: Unknown (10/05/2020)   Social  Connection and Isolation Panel [NHANES]    Frequency of Communication with Friends and Family: Not on file    Frequency of Social Gatherings with Friends and Family: Once a week    Attends Religious Services: Never    Marine scientist or Organizations: No    Attends Music therapist: Never    Marital Status: Never married     Family History: The patient's family history is negative for Heart attack, Colon cancer, and Osteoporosis.  ROS:   Please see the history of present illness.  All other systems reviewed and are negative.  Labs/Other Studies Reviewed:    The following studies were reviewed today:  Cardiac Monitor 10/13/21 Basic rhythm is NSR with average HR 82 bpm Self terminating WCT occurred 5 times, longest 8.5 seconds. No symptoms. Frequent self terminating SVT longest lasting 16 seconds, and also asymptomatic PAC burden 8.7% No atrial fibrillation. Rare second degree AVB during sllep.  Echo 03/27/21  Left Ventricle: Abnormal septal motion Note PVCls during study. Left  ventricular ejection fraction, by estimation, is 50 to 55%. The left  ventricle has low normal function. The left ventricle has no regional wall  motion abnormalities. The left  ventricular internal cavity size was normal in size. There is no left  ventricular hypertrophy. Left ventricular diastolic parameters were  normal.  Right Ventricle: The right ventricular size is normal. No increase in  right ventricular wall thickness. Right ventricular systolic function is  normal.  Left Atrium: Left atrial size was normal in size.  Right Atrium: Right atrial size was normal in size.  Pericardium: There is no evidence of pericardial effusion.  Mitral Valve: MR jet not well characterized Eccnetric posteriorly directed  ? mechanism restricted posterior leaflet motion MR looked moderate to me  as well on TTE 03/30/19. The mitral valve is  abnormal. There is mild  thickening of the mitral valve  leaflet(s). Moderate mitral valve regurgitation. No evidence of mitral  valve stenosis.  Tricuspid Valve: The tricuspid valve is normal in structure. Tricuspid  valve regurgitation is mild to moderate. No evidence of tricuspid  stenosis.  Aortic Valve: The aortic valve is tricuspid. Aortic valve regurgitation is  not visualized. No aortic stenosis is present.  Pulmonic Valve: The pulmonic valve was normal in structure. Pulmonic valve  regurgitation is not visualized. No evidence of pulmonic stenosis.  Aorta: The aortic root is normal in size and structure.  Venous: The inferior vena cava is normal in size with greater than 50%  respiratory variability, suggesting right atrial pressure of 3 mmHg.  IAS/Shunts: No atrial level shunt detected by color flow Doppler.   Cardiac monitor 11/2016  Normal sinus rhythm and sinus tachycardia No reported complaints Rare PAC's   Normal study   Recent Labs: 07/10/2021: ALT 18; BUN 9; Creatinine, Ser 0.85; Hemoglobin 12.1; Platelets 311; Potassium 3.9; Sodium 138 09/13/2021: Magnesium 1.8; TSH 0.901  Recent Lipid Panel    Component Value Date/Time   CHOL 238 (H) 06/20/2020 1142   TRIG 86.0 06/20/2020 1142   HDL 76.10 06/20/2020 1142   CHOLHDL 3 06/20/2020 1142   VLDL 17.2 06/20/2020 1142   LDLCALC 145 (H) 06/20/2020 1142     Risk Assessment/Calculations:        Physical Exam:    VS:  BP (!) 146/72   Pulse 74   Ht '5\' 8"'$  (1.727 m)   Wt 150 lb (68 kg)   LMP 06/25/2006 (LMP Unknown)   SpO2 97%   BMI  22.81 kg/m     Wt Readings from Last 3 Encounters:  07/02/22 150 lb (68 kg)  01/01/22 150 lb (68 kg)  10/16/21 150 lb (68 kg)     GEN:  Well nourished, chronically ill appearing in no acute distress HEENT: Normal NECK: No JVD; No carotid bruits CARDIAC: Irregular RR, no murmurs, rubs, gallops RESPIRATORY:  Clear to auscultation without rales, wheezing or rhonchi  ABDOMEN:  Soft, non-tender, non-distended MUSCULOSKELETAL:  No edema; No deformity. 2+  pedal pulses, equal bilaterally SKIN: Warm and dry NEUROLOGIC:  Alert and oriented x 3 PSYCHIATRIC:  Normal affect   EKG:  EKG is not ordered today  Diagnoses:    1. Aortic dilatation (HCC)   2. Essential hypertension   3. Premature atrial contractions   4. Marfan's syndrome   5. Chronic diastolic CHF (congestive heart failure) ([place])   6. Nonrheumatic mitral valve regurgitation   7. Hyperlipidemia LDL goal <100     Assessment and Plan:     Aortic dilatation 2/2 Marfan's Syndrome: Normal aortic root in size and structure, aortic valve tricuspid by echo 03/2021. No indication for further testing at this time.   Palpitaitons/Frequent PACs: Frequent PACs on cardiac monitor 09/2021 with PAC burden 8.7%, frequent self terminating SVT longest lasting 16 seconds, no atrial fibrillation. She is asymptomatic. Continue metoprolol.    Hyperlipidemia: No recent lipid panel to review. Offered to check lipids today but she would like to have labs done with PCP at her appointment 1/29. Advised LDL goal < 100.   Essential hypertension: BP initially elevated but improved on my recheck. Reports normal to soft BP at home. She has stopped amlodipine. No additional medication changes today.   Chronic HFpEF: LVEF 16-60%,YTKZSW diastolic parameters by echo 03/2021, previously impaired LV diastolic filling on echo 03/9322. No evidence of volume overload on exam. She denies dyspnea, orthopnea, PND, edema. Continue Toprol XL. Unable to add GDMT in the setting of soft BP.   Mitral regurgitation: Mild to moderate mitral regurgitation on echo 03/2021. She is asymptomatic. I do not appreciate a significant murmur on exam today. Discussed repeating echocardiogram, however in the absence of symptoms we will continue to monitor clinically for now.     Disposition: 6-8 months with Dr. Gasper Sells (transfer from Dr. Tamala Julian)  Medication  Adjustments/Labs and Tests Ordered: Current medicines are reviewed at length with the patient today.  Concerns regarding medicines are outlined above.  No orders of the defined types were placed in this encounter.  No orders of the defined types were placed in this encounter.   Patient Instructions  Medication Instructions:   Your physician recommends that you continue on your current medications as directed. Please refer to the Current Medication list given to you today.   *If you need a refill on your cardiac medications before your next appointment, please call your pharmacy*   Lab Work:  None ordered.  If you have labs (blood work) drawn today and your tests are completely normal, you will receive your results only by: [place] (if you have MyChart) OR A paper copy in the mail If you have any lab test that is abnormal or we need to change your treatment, we will call you to review the results.   Testing/Procedures:  None ordered.   Follow-Up: At [facility], you and your health needs are our priority.  As part of our continuing mission to provide you with exceptional heart care, we have created designated Provider Care Teams.  These  Care Teams include your primary Cardiologist (physician) and Advanced Practice Providers (APPs -  Physician Assistants and Nurse Practitioners) who all work together to provide you with the care you need, when you need it.  We recommend signing up for the patient portal called "MyChart".  Sign up information is provided on this After Visit Summary.  MyChart is used to connect with patients for Virtual Visits (Telemedicine).  Patients are able to view lab/test results, encounter notes, upcoming appointments, etc.  Non-urgent messages can be sent to your provider as well.   To learn more about what you can do with MyChart, go to [email or web address].    Your next appointment:   4 month(s)  The format for your next appointment:    In Person  Provider:   Dr. Gasper Sells.     Important Information About Sugar         Signed, Emmaline Life, NP  07/02/2022 1:27 PM    [facility]

## 2022-07-02 ENCOUNTER — Encounter: Payer: Self-pay | Admitting: Nurse Practitioner

## 2022-07-02 ENCOUNTER — Ambulatory Visit: Payer: Medicare Other | Attending: Nurse Practitioner | Admitting: Nurse Practitioner

## 2022-07-02 VITALS — BP 146/72 | HR 74 | Ht 68.0 in | Wt 150.0 lb

## 2022-07-02 DIAGNOSIS — I10 Essential (primary) hypertension: Secondary | ICD-10-CM | POA: Diagnosis not present

## 2022-07-02 DIAGNOSIS — I77819 Aortic ectasia, unspecified site: Secondary | ICD-10-CM

## 2022-07-02 DIAGNOSIS — E785 Hyperlipidemia, unspecified: Secondary | ICD-10-CM

## 2022-07-02 DIAGNOSIS — I491 Atrial premature depolarization: Secondary | ICD-10-CM

## 2022-07-02 DIAGNOSIS — I340 Nonrheumatic mitral (valve) insufficiency: Secondary | ICD-10-CM | POA: Diagnosis not present

## 2022-07-02 DIAGNOSIS — Q8740 Marfan's syndrome, unspecified: Secondary | ICD-10-CM | POA: Diagnosis not present

## 2022-07-02 DIAGNOSIS — I5032 Chronic diastolic (congestive) heart failure: Secondary | ICD-10-CM

## 2022-07-02 NOTE — Patient Instructions (Signed)
Medication Instructions:   Your physician recommends that you continue on your current medications as directed. Please refer to the Current Medication list given to you today.   *If you need a refill on your cardiac medications before your next appointment, please call your pharmacy*   Lab Work:  None ordered.  If you have labs (blood work) drawn today and your tests are completely normal, you will receive your results only by: [place] (if you have MyChart) OR A paper copy in the mail If you have any lab test that is abnormal or we need to change your treatment, we will call you to review the results.   Testing/Procedures:  None ordered.   Follow-Up: At [facility], you and your health needs are our priority.  As part of our continuing mission to provide you with exceptional heart care, we have created designated Provider Care Teams.  These Care Teams include your primary Cardiologist (physician) and Advanced Practice Providers (APPs -  Physician Assistants and Nurse Practitioners) who all work together to provide you with the care you need, when you need it.  We recommend signing up for the patient portal called "MyChart".  Sign up information is provided on this After Visit Summary.  MyChart is used to connect with patients for Virtual Visits (Telemedicine).  Patients are able to view lab/test results, encounter notes, upcoming appointments, etc.  Non-urgent messages can be sent to your provider as well.   To learn more about what you can do with MyChart, go to [email or web address].    Your next appointment:   4 month(s)  The format for your next appointment:   In Person  Provider:   Dr. Gasper Sells.     Important Information About Sugar

## 2022-07-05 DIAGNOSIS — Z933 Colostomy status: Secondary | ICD-10-CM | POA: Diagnosis not present

## 2022-07-19 ENCOUNTER — Other Ambulatory Visit: Payer: Self-pay | Admitting: Family Medicine

## 2022-07-19 DIAGNOSIS — I10 Essential (primary) hypertension: Secondary | ICD-10-CM

## 2022-07-23 ENCOUNTER — Ambulatory Visit (INDEPENDENT_AMBULATORY_CARE_PROVIDER_SITE_OTHER): Payer: 59 | Admitting: Family Medicine

## 2022-07-23 ENCOUNTER — Encounter: Payer: Self-pay | Admitting: Family Medicine

## 2022-07-23 VITALS — BP 120/82 | HR 84 | Temp 98.4°F | Ht 68.0 in | Wt 150.0 lb

## 2022-07-23 DIAGNOSIS — F1190 Opioid use, unspecified, uncomplicated: Secondary | ICD-10-CM

## 2022-07-23 DIAGNOSIS — R739 Hyperglycemia, unspecified: Secondary | ICD-10-CM | POA: Diagnosis not present

## 2022-07-23 DIAGNOSIS — G8929 Other chronic pain: Secondary | ICD-10-CM | POA: Diagnosis not present

## 2022-07-23 DIAGNOSIS — M069 Rheumatoid arthritis, unspecified: Secondary | ICD-10-CM

## 2022-07-23 DIAGNOSIS — R600 Localized edema: Secondary | ICD-10-CM

## 2022-07-23 DIAGNOSIS — M810 Age-related osteoporosis without current pathological fracture: Secondary | ICD-10-CM

## 2022-07-23 DIAGNOSIS — Z23 Encounter for immunization: Secondary | ICD-10-CM | POA: Diagnosis not present

## 2022-07-23 DIAGNOSIS — I5032 Chronic diastolic (congestive) heart failure: Secondary | ICD-10-CM

## 2022-07-23 DIAGNOSIS — E785 Hyperlipidemia, unspecified: Secondary | ICD-10-CM

## 2022-07-23 DIAGNOSIS — K219 Gastro-esophageal reflux disease without esophagitis: Secondary | ICD-10-CM | POA: Diagnosis not present

## 2022-07-23 DIAGNOSIS — I483 Typical atrial flutter: Secondary | ICD-10-CM

## 2022-07-23 DIAGNOSIS — M1A9XX0 Chronic gout, unspecified, without tophus (tophi): Secondary | ICD-10-CM

## 2022-07-23 DIAGNOSIS — D509 Iron deficiency anemia, unspecified: Secondary | ICD-10-CM

## 2022-07-23 DIAGNOSIS — H5440 Blindness, one eye, unspecified eye: Secondary | ICD-10-CM | POA: Diagnosis not present

## 2022-07-23 DIAGNOSIS — I639 Cerebral infarction, unspecified: Secondary | ICD-10-CM

## 2022-07-23 DIAGNOSIS — I10 Essential (primary) hypertension: Secondary | ICD-10-CM

## 2022-07-23 LAB — BASIC METABOLIC PANEL
BUN: 11 mg/dL (ref 6–23)
CO2: 29 mEq/L (ref 19–32)
Calcium: 9.8 mg/dL (ref 8.4–10.5)
Chloride: 104 mEq/L (ref 96–112)
Creatinine, Ser: 0.72 mg/dL (ref 0.40–1.20)
GFR: 87.16 mL/min (ref >60.00)
Glucose, Bld: 91 mg/dL (ref 70–99)
Potassium: 4.2 mEq/L (ref 3.5–5.1)
Sodium: 142 mEq/L (ref 135–145)

## 2022-07-23 LAB — CBC WITH DIFFERENTIAL/PLATELET
Basophils Absolute: 0 10*3/uL (ref 0.0–0.1)
Basophils Relative: 0.6 % (ref 0.0–3.0)
Eosinophils Absolute: 0.3 10*3/uL (ref 0.0–0.7)
Eosinophils Relative: 4.2 % (ref 0.0–5.0)
HCT: 35.1 % — ABNORMAL LOW (ref 36.0–46.0)
Hemoglobin: 11.9 g/dL — ABNORMAL LOW (ref 12.0–15.0)
Lymphocytes Relative: 23.7 % (ref 12.0–46.0)
Lymphs Abs: 1.5 10*3/uL (ref 0.7–4.0)
MCHC: 34 g/dL (ref 30.0–36.0)
MCV: 96.7 fl (ref 78.0–100.0)
Monocytes Absolute: 0.6 10*3/uL (ref 0.1–1.0)
Monocytes Relative: 9 % (ref 3.0–12.0)
Neutro Abs: 4.1 10*3/uL (ref 1.4–7.7)
Neutrophils Relative %: 62.5 % (ref 43.0–77.0)
Platelets: 287 10*3/uL (ref 150.0–400.0)
RBC: 3.63 Mil/uL — ABNORMAL LOW (ref 3.87–5.11)
RDW: 14.7 % (ref 11.5–15.5)
WBC: 6.5 10*3/uL (ref 4.0–10.5)

## 2022-07-23 LAB — LIPID PANEL
Cholesterol: 244 mg/dL — ABNORMAL HIGH (ref 0–200)
HDL: 91.3 mg/dL (ref >39.00)
LDL Cholesterol: 133 mg/dL — ABNORMAL HIGH (ref 0–99)
NonHDL: 153.01
Total CHOL/HDL Ratio: 3
Triglycerides: 99 mg/dL (ref 0.0–149.0)
VLDL: 19.8 mg/dL (ref 0.0–40.0)

## 2022-07-23 LAB — HEPATIC FUNCTION PANEL
ALT: 12 U/L (ref 0–35)
AST: 22 U/L (ref 0–37)
Albumin: 4.2 g/dL (ref 3.5–5.2)
Alkaline Phosphatase: 73 U/L (ref 39–117)
Bilirubin, Direct: 0.1 mg/dL (ref 0.0–0.3)
Total Bilirubin: 0.5 mg/dL (ref 0.2–1.2)
Total Protein: 7.2 g/dL (ref 6.0–8.3)

## 2022-07-23 LAB — HEMOGLOBIN A1C: Hgb A1c MFr Bld: 5.5 % (ref 4.6–6.5)

## 2022-07-23 LAB — TSH: TSH: 1.39 u[IU]/mL (ref 0.35–5.50)

## 2022-07-23 LAB — URIC ACID: Uric Acid, Serum: 4 mg/dL (ref 2.4–7.0)

## 2022-07-23 MED ORDER — TRIAMCINOLONE ACETONIDE 0.1 % EX LOTN
TOPICAL_LOTION | Freq: Two times a day (BID) | CUTANEOUS | 5 refills | Status: DC
Start: 2022-07-23 — End: 2022-11-09

## 2022-07-23 MED ORDER — CLOBETASOL PROPIONATE 0.05 % EX OINT: 1.0000 | TOPICAL_OINTMENT | Freq: Two times a day (BID) | CUTANEOUS | 5 refills | Status: DC

## 2022-07-23 NOTE — Addendum Note (Signed)
Addended by: Wyvonne Lenz on: 07/23/2022 11:55 AM   Modules accepted: Orders

## 2022-07-23 NOTE — Progress Notes (Signed)
Subjective:    Patient ID: [name], female    DOB: 05-02-1956, 67 y.o.   MRN: [id number]  HPI Here with her daughter to follow up on issues. In general she is doing well. Her appetite is good, and her weight is stable. Her BP at home is well controlled. Infact after her BP started dropping too low a few months ago, she stopped taking the Amlodipine. She sees Dr. Gavin Pound for her RA, which seems to be stable. She is enrolled in a pain management program with [place]. Her GERD is stable. The leg edema has resolved.    Review of Systems  Constitutional: Negative.   HENT: Negative.    Eyes:  Positive for visual disturbance.  Respiratory: Negative.    Cardiovascular: Negative.   Gastrointestinal: Negative.   Genitourinary:  Negative for decreased urine volume, difficulty urinating, dyspareunia, dysuria, enuresis, flank pain, frequency, hematuria, pelvic pain and urgency.  Musculoskeletal:  Positive for arthralgias and back pain.  Skin: Negative.   Neurological: Negative.  Negative for headaches.  Psychiatric/Behavioral: Negative.         Objective:   Physical Exam Constitutional:      General: She is not in acute distress.    Appearance: She is well-developed.     Comments: [name]   HENT:     Head: Normocephalic and atraumatic.     Right Ear: External ear normal.     Left Ear: External ear normal.     Nose: Nose normal.     Mouth/Throat:     Pharynx: Oropharynx is clear. No oropharyngeal exudate.  Neck:     Thyroid: No thyromegaly.     Vascular: No JVD.  Cardiovascular:     Rate and Rhythm: Normal rate and regular rhythm.     Pulses: Normal pulses.     Heart sounds: Normal heart sounds. No murmur heard.    No friction rub. No gallop.  Pulmonary:     Effort: Pulmonary effort is normal. No respiratory distress.     Breath sounds: Normal breath sounds. No wheezing or rales.  Chest:     Chest wall: No tenderness.  Abdominal:     General: Abdomen is flat.  Bowel sounds are normal. There is no distension.     Palpations: Abdomen is soft. There is no mass.     Tenderness: There is no abdominal tenderness. There is no guarding or rebound.  Musculoskeletal:        General: No tenderness. Normal range of motion.     Cervical back: Normal range of motion and neck supple.  Lymphadenopathy:     Cervical: No cervical adenopathy.  Skin:    General: Skin is warm and dry.     Findings: No erythema or rash.  Neurological:     Mental Status: She is alert and oriented to person, place, and time. Mental status is at baseline.     Cranial Nerves: No cranial nerve deficit.     Motor: No abnormal muscle tone.     Coordination: Coordination normal.     Deep Tendon Reflexes: Reflexes are normal and symmetric. Reflexes normal.  Psychiatric:        Mood and Affect: Mood normal.        Behavior: Behavior normal.        Thought Content: Thought content normal.        Judgment: Judgment normal.           Assessment & Plan:  Her  HTN is well controlled, so we agreed to stay off Amlodipine. She will continue to take the Metoprolol. Her GERD is stable. She will follow up with Rheumatology for the RA. Get fasting labs to check lipids, BMET, etc. Her gout is well controlled. Her CHF is well controlled. We spent a total of ( 34  ) minutes reviewing records and discussing these issues.  [name], MD

## 2022-07-25 ENCOUNTER — Telehealth (INDEPENDENT_AMBULATORY_CARE_PROVIDER_SITE_OTHER): Payer: 59 | Admitting: Family Medicine

## 2022-07-25 ENCOUNTER — Encounter: Payer: Self-pay | Admitting: Family Medicine

## 2022-07-25 ENCOUNTER — Telehealth: Payer: Self-pay | Admitting: Family Medicine

## 2022-07-25 DIAGNOSIS — M069 Rheumatoid arthritis, unspecified: Secondary | ICD-10-CM | POA: Diagnosis not present

## 2022-07-25 DIAGNOSIS — F1190 Opioid use, unspecified, uncomplicated: Secondary | ICD-10-CM

## 2022-07-25 LAB — DRUG MONITOR, PANEL 1, W/CONF, URINE
Amphetamines: NEGATIVE ng/mL (ref <500)
Barbiturates: NEGATIVE ng/mL (ref <300)
Benzodiazepines: NEGATIVE ng/mL (ref <100)
Cocaine Metabolite: NEGATIVE ng/mL (ref <150)
Codeine: NEGATIVE ng/mL (ref <50)
Creatinine: 50.5 mg/dL (ref >=20.0)
Hydrocodone: 293 ng/mL — ABNORMAL HIGH (ref <50)
Hydromorphone: NEGATIVE ng/mL (ref <50)
Marijuana Metabolite: NEGATIVE ng/mL (ref <20)
Methadone Metabolite: NEGATIVE ng/mL (ref <100)
Morphine: NEGATIVE ng/mL (ref <50)
Norhydrocodone: 876 ng/mL — ABNORMAL HIGH (ref <50)
Opiates: POSITIVE ng/mL — AB (ref <100)
Oxidant: NEGATIVE ug/mL (ref <200)
Oxycodone: NEGATIVE ng/mL (ref <100)
Phencyclidine: NEGATIVE ng/mL (ref <25)
pH: 6.3 (ref 4.5–9.0)

## 2022-07-25 LAB — DM TEMPLATE

## 2022-07-25 NOTE — Telephone Encounter (Signed)
Pt is calling and does have her results however she would like to know what should she watch as far as her diet. Pt lipid was a little elevated

## 2022-07-25 NOTE — Progress Notes (Signed)
Subjective:    Patient ID: [name], female    DOB: 1956/03/04, 67 y.o.   MRN: [id number]  HPI Virtual Visit via Video Note  I connected with the patient on 07/25/22 at  1:30 PM EST by a video enabled telemedicine application and verified that I am speaking with the correct person using two identifiers.  Location patient: home Location provider:work or home office Persons participating in the virtual visit: patient, provider  I discussed the limitations of evaluation and management by telemedicine and the availability of in person appointments. The patient expressed understanding and agreed to proceed.   HPI: Here for pain management. She is doing about the same as usual.    ROS: See pertinent positives and negatives per HPI.  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   bilateral     Past Surgical History:  Procedure Laterality Date   ABLATION  01/04/14   atrial flutter ablation (2 circuits) by Dr Lovena Le   ATRIAL FLUTTER ABLATION N/A 01/04/2014   Procedure: ATRIAL FLUTTER ABLATION;  Surgeon: Evans Lance, MD;  Location: [facility] CATH LAB;  Service: Cardiovascular;  Laterality: N/A;   BUNIONECTOMY Bilateral 1/10    COLON SURGERY     colectomy   COLOSTOMY  11/27/2012   HERNIA REPAIR     IR RADIOLOGIST EVAL & MGMT  10/01/2017   IR RADIOLOGIST EVAL & MGMT  11/21/2017   IR RADIOLOGIST EVAL & MGMT  02/13/2018   IR RADIOLOGIST EVAL & MGMT  01/08/2019   IR RADIOLOGIST EVAL & MGMT  12/29/2019   IR RADIOLOGIST EVAL & MGMT  01/31/2021   IR RADIOLOGIST EVAL & MGMT  12/20/2021   LAPAROSCOPIC ABDOMINAL EXPLORATION N/A 01/06/2013   Procedure: LAPAROSCOPIC ABDOMINAL EXPLORATION;  Surgeon: Adin Hector, MD;  Location: [facility] ORS;  Service: General;  Laterality: N/A;   LAPAROSCOPIC LYSIS OF ADHESIONS N/A 01/06/2013   Procedure: LAPAROSCOPIC LYSIS OF ADHESIONS/ INTEROTOMY REPAIR;  Surgeon: Adin Hector, MD;  Location: [facility];  Service: General;  Laterality: N/A;   LAPAROTOMY N/A 11/27/2012   Procedure: EXPLORATORY LAPAROTOMY SIGMOID COLECTOMY, COLOSTOMY;  Surgeon: Madilyn Hook, DO;  Location: [facility];  Service: General;  Laterality: N/A;   RADIOLOGY WITH ANESTHESIA Left 10/23/2017   Procedure: CT RENAL CRYO AND BIOPSY;  Surgeon: Aletta Edouard, MD;  Location: [facility];  Service: Radiology;  Laterality: Left;   TOTAL KNEE ARTHROPLASTY Bilateral 6/08    Family History  Problem Relation Age of Onset   Heart attack Neg Hx    Colon cancer  Neg Hx    Osteoporosis Neg Hx      Current Outpatient Medications:    Abatacept 125 MG/ML SOSY, Inject 125 mg into the skin once a week. Has on Sunday, Disp: , Rfl:    albuterol (VENTOLIN HFA) 108 (90 Base) MCG/ACT inhaler, INHALE 1 PUFF INTO THE LUNGS EVERY 4 HOURS AS NEEDED FOR WHEEZING OR SHORTNESS OF BREATH, Disp: 8.5 g, Rfl: 3   allopurinol (ZYLOPRIM) 100 MG tablet, TAKE 1 TABLET BY MOUTH EVERY DAY, Disp: 90 tablet, Rfl: 0   aspirin EC 81 MG tablet, Take 1 tablet (81 mg total) by mouth daily., Disp: , Rfl:    CALCIUM PO, Take 1 tablet by mouth 2 (two) times daily. 600 mg of calcium with vitamin D, Disp: , Rfl:    cetirizine (ZYRTEC) 10 MG tablet, Take 1 tablet (10 mg total) by mouth daily.,  Disp: 90 tablet, Rfl: 1   Cholecalciferol (VITAMIN D) 2000 units CAPS, Take 2,000 Units by mouth daily., Disp: , Rfl:    ciprofloxacin-dexamethasone (CIPRODEX) OTIC suspension, Place 4 drops into both ears 2 (two) times daily., Disp: 7.5 mL, Rfl: 5   clobetasol ointment (TEMOVATE) 5.40 %, Apply 1 Application topically 2 (two) times daily., Disp: 60 g, Rfl: 5   diclofenac sodium (VOLTAREN) 1 % GEL, Apply 1 application topically 4 (four) times daily as needed (arthritis)., Disp: , Rfl:    docusate sodium (COLACE) 100 MG capsule, Take 100 mg by mouth 2 (two) times daily., Disp: , Rfl:    esomeprazole (NEXIUM) 40 MG capsule, TAKE 1 CAPSULE(40 MG) BY MOUTH DAILY IN THE AFTERNOON, Disp: 90 capsule, Rfl: 0   folic acid (FOLVITE) 981 MCG tablet, Take 400 mcg by mouth daily., Disp: , Rfl:    HYDROcodone-acetaminophen (NORCO) 10-325 MG tablet, Take 1 tablet by mouth every 6 (six) hours as needed for moderate pain., Disp: 120 tablet, Rfl: 0   ketoconazole (NIZORAL) 2 % shampoo, Apply 1 application topically 2 (two) times a week., Disp: , Rfl:    megestrol (MEGACE ES) 625 MG/5ML suspension, SHAKE LIQUID AND TAKE 5 ML BY MOUTH THREE TIMES DAILY BEFORE MEALS AS NEEDED FOR APPETITE, Disp: 150 mL, Rfl: 5   Methotrexate Sodium (METHOTREXATE, PF,) 50 MG/2ML injection, Inject 0.5 mLs into the muscle once a week. Sunday, Disp: , Rfl: 0   metoprolol succinate (TOPROL-XL) 25 MG 24 hr tablet, Take 1 tablet (25 mg total) by mouth daily., Disp: 90 tablet, Rfl: 3   Multiple Vitamin (MULTIVITAMIN WITH MINERALS) TABS tablet, Take 1 tablet by mouth daily., Disp: , Rfl:    mupirocin ointment (BACTROBAN) 2 %, Apply 1 Application topically 2 (two) times daily., Disp: 30 g, Rfl: 2   ondansetron (ZOFRAN-ODT) 8 MG disintegrating tablet, Take 1 tablet (8 mg total) by mouth every 8 (eight) hours as needed for nausea or vomiting., Disp: 60 tablet, Rfl: 5   PERIDEX 0.12 % solution, Use as directed 15 mLs in the mouth or throat 2 (two)  times daily. , Disp: , Rfl:    polyethylene glycol (MIRALAX / GLYCOLAX) 17 g packet, Take 17 g by mouth daily as needed for mild constipation., Disp: 14 each, Rfl: 1   predniSONE (DELTASONE) 5 MG tablet, Take 5-10 mg by mouth daily as needed., Disp: , Rfl:    temazepam (RESTORIL) 30 MG capsule, 1 capsule at bedtime as needed, Disp: , Rfl:    tiZANidine (ZANAFLEX) 4 MG tablet, TAKE 1 TABLET(4 MG) BY MOUTH TWICE DAILY, Disp: 90 tablet, Rfl: 5  traMADol (ULTRAM) 50 MG tablet, TAKE 1 TO 2 TABLETS(50 TO 100 MG) BY MOUTH EVERY 6 HOURS AS NEEDED, Disp: 180 tablet, Rfl: 1   triamcinolone lotion (KENALOG) 0.1 %, Apply topically 2 (two) times daily., Disp: 60 mL, Rfl: 5   vitamin B-12 (CYANOCOBALAMIN) 500 MCG tablet, Take 500 mcg by mouth daily., Disp: , Rfl:    vitamin C (ASCORBIC ACID) 500 MG tablet, Take 500 mg by mouth daily. , Disp: , Rfl:    zoledronic acid (RECLAST) 5 MG/100ML SOLN injection, Inject 5 mg into the vein once., Disp: , Rfl:   EXAM:  VITALS per patient if applicable:  GENERAL: alert, oriented, appears well and in no acute distress  HEENT: atraumatic, conjunttiva clear, no obvious abnormalities on inspection of external nose and ears  NECK: normal movements of the head and neck  LUNGS: on inspection no signs of respiratory distress, breathing rate appears normal, no obvious gross SOB, gasping or wheezing  CV: no obvious cyanosis  MS: moves all visible extremities without noticeable abnormality  PSYCH/NEURO: pleasant and cooperative, no obvious depression or anxiety, speech and thought processing grossly intact  ASSESSMENT AND PLAN: Pain management.  Indication for chronic opioid: RA Medication and dose: Norco 10-325 # pills per month: 120 Last UDS date: 07-23-22 Opioid Treatment Agreement signed (Y/N): 07-12-17 Opioid Treatment Agreement last reviewed with patient:  07-25-22 [facility] reviewed this encounter (include red flags): Yes Meds were refilled.  [name],  MD  Discussed the following assessment and plan:  No diagnosis found.     I discussed the assessment and treatment plan with the patient. The patient was provided an opportunity to ask questions and all were answered. The patient agreed with the plan and demonstrated an understanding of the instructions.   The patient was advised to call back or seek an in-person evaluation if the symptoms worsen or if the condition fails to improve as anticipated.      Review of Systems     Objective:   Physical Exam        Assessment & Plan:

## 2022-07-27 NOTE — Telephone Encounter (Signed)
Watch her intake of fried foods (like meats, chips, and [place] fries), fatty meats like sausage and bacon, and dishes with gravy. Use only low fat dairy products

## 2022-07-27 NOTE — Telephone Encounter (Signed)
Patient requesting lab results to be faxed to [facility].

## 2022-07-31 NOTE — Telephone Encounter (Signed)
Pt last lab results faxed to [facility] office as requested by pt

## 2022-08-01 ENCOUNTER — Telehealth: Payer: Self-pay | Admitting: Family Medicine

## 2022-08-01 MED ORDER — HYDROCODONE-ACETAMINOPHEN 10-325 MG PO TABS: 1.0000 | ORAL_TABLET | Freq: Four times a day (QID) | ORAL | 0 refills | Status: DC | PRN

## 2022-08-01 NOTE — Telephone Encounter (Signed)
Done

## 2022-08-01 NOTE — Telephone Encounter (Signed)
UDS-07/23/22 Pharmacy updated.

## 2022-08-01 NOTE — Addendum Note (Signed)
Addended by: [name] A on: 08/01/2022 12:04 PM   Modules accepted: Orders

## 2022-08-01 NOTE — Telephone Encounter (Signed)
Pt seeking refill HYDROcodone-acetaminophen (NORCO) 10-325 MG tablet PMV completed 07/25/22   [id number] - [place], [place] - [address] AT [place] Phone: [phone]  Fax: [phone]

## 2022-08-06 ENCOUNTER — Telehealth: Payer: Self-pay

## 2022-08-06 DIAGNOSIS — Z933 Colostomy status: Secondary | ICD-10-CM | POA: Diagnosis not present

## 2022-08-06 NOTE — Progress Notes (Signed)
Care Management & Coordination Services Pharmacy Team  Reason for Encounter: Hypertension  Contacted patient to discuss hypertension disease state. Spoke with patient on 08/06/2022   Current antihypertensive regimen:  Metoprolol 25 mg daily  Patient verbally confirms she is taking the above medications as directed. Yes  How often are you checking your Blood Pressure? Patient states she is checking blood pressures 1-2 times per week   she checks her blood pressure  at various times  in the day.   Current home BP readings:  Patient doesn't keep a log of her readings, she states they are always normal and her reading today was 110/65.   Wrist or arm cuff: wrist cuff  OTC medications including pseudoephedrine or NSAIDs? Patient denies taking these medications.   Any readings above 180/100? Patient denies.   What recent interventions/DTPs have been made by any provider to improve Blood Pressure control since last CPP Visit: No recent interventions.   Any recent hospitalizations or ED visits since last visit with CPP? No recent hospital visits.   What diet changes have been made to improve Blood Pressure Control?  Patient has not made any changes to her diet Breakfast - patient does not eat breakfast Lunch - patient will have a sandwich and a soda Dinner - patient will have a meat and vegetable.  Caffeine intake: patient will have 2 cups daily Salt intake: patient doesn't add salt  What exercise is being done to improve your Blood Pressure Control?  Patient states she sits most of the day, she does do her range of motion exercises.   Adherence Review: Is the patient currently on ACE/ARB medication? No Does the patient have >5 day gap between last estimated fill dates? No  Care Gaps: AWV - completed 10/16/2021 Foot exam - never done Eye exam - never done Urine ACR - never done Hep C Screen - never done Shingrix - postponed   Star Rating Drug: None   Chart  Updates: Recent office visits:  04/23/2022 [name] MD - Patient was seen for chronic narcotic use and an additional concern. No medication changes.   Recent consult visits:  04/02/2022 [name] PA-C (ENT) - Patient was seen for tympanic membrane perforation, left and an additional concern. No medication changes.  Hospital visits:  None  Medications: Outpatient Encounter Medications as of 08/06/2022  Medication Sig   Abatacept 125 MG/ML SOSY Inject 125 mg into the skin once a week. Has on Sunday   albuterol (VENTOLIN HFA) 108 (90 Base) MCG/ACT inhaler INHALE 1 PUFF INTO THE LUNGS EVERY 4 HOURS AS NEEDED FOR WHEEZING OR SHORTNESS OF BREATH   allopurinol (ZYLOPRIM) 100 MG tablet TAKE 1 TABLET BY MOUTH EVERY DAY   aspirin EC 81 MG tablet Take 1 tablet (81 mg total) by mouth daily.   CALCIUM PO Take 1 tablet by mouth 2 (two) times daily. 600 mg of calcium with vitamin D   cetirizine (ZYRTEC) 10 MG tablet Take 1 tablet (10 mg total) by mouth daily.   Cholecalciferol (VITAMIN D) 2000 units CAPS Take 2,000 Units by mouth daily.   ciprofloxacin-dexamethasone (CIPRODEX) OTIC suspension Place 4 drops into both ears 2 (two) times daily.   clobetasol ointment (TEMOVATE) [id number] % Apply 1 Application topically 2 (two) times daily.   diclofenac sodium (VOLTAREN) 1 % GEL Apply 1 application topically 4 (four) times daily as needed (arthritis).   docusate sodium (COLACE) 100 MG capsule Take 100 mg by mouth 2 (two) times daily.   esomeprazole ([place])  40 MG capsule TAKE 1 CAPSULE(40 MG) BY MOUTH DAILY IN THE AFTERNOON   folic acid (FOLVITE) [id number] MCG tablet Take 400 mcg by mouth daily.   [START ON 09/30/2022] HYDROcodone-acetaminophen (NORCO) 10-325 MG tablet Take 1 tablet by mouth every 6 (six) hours as needed for moderate pain.   ketoconazole (NIZORAL) 2 % shampoo Apply 1 application topically 2 (two) times a week.   megestrol (MEGACE ES) 625 MG/5ML suspension SHAKE LIQUID AND TAKE 5 ML BY MOUTH THREE  TIMES DAILY BEFORE MEALS AS NEEDED FOR APPETITE   Methotrexate Sodium (METHOTREXATE, PF,) 50 MG/2ML injection Inject 0.5 mLs into the muscle once a week. Sunday   metoprolol succinate (TOPROL-XL) 25 MG 24 hr tablet Take 1 tablet (25 mg total) by mouth daily.   Multiple Vitamin (MULTIVITAMIN WITH MINERALS) TABS tablet Take 1 tablet by mouth daily.   mupirocin ointment (BACTROBAN) 2 % Apply 1 Application topically 2 (two) times daily.   ondansetron (ZOFRAN-ODT) 8 MG disintegrating tablet Take 1 tablet (8 mg total) by mouth every 8 (eight) hours as needed for nausea or vomiting.   PERIDEX 0.12 % solution Use as directed 15 mLs in the mouth or throat 2 (two) times daily.    polyethylene glycol (MIRALAX / GLYCOLAX) 17 g packet Take 17 g by mouth daily as needed for mild constipation.   predniSONE (DELTASONE) 5 MG tablet Take 5-10 mg by mouth daily as needed.   temazepam (RESTORIL) 30 MG capsule 1 capsule at bedtime as needed   tiZANidine (ZANAFLEX) 4 MG tablet TAKE 1 TABLET(4 MG) BY MOUTH TWICE DAILY   traMADol (ULTRAM) 50 MG tablet TAKE 1 TO 2 TABLETS(50 TO 100 MG) BY MOUTH EVERY 6 HOURS AS NEEDED   triamcinolone lotion (KENALOG) 0.1 % Apply topically 2 (two) times daily.   vitamin B-12 (CYANOCOBALAMIN) 500 MCG tablet Take 500 mcg by mouth daily.   vitamin C (ASCORBIC ACID) 500 MG tablet Take 500 mg by mouth daily.    zoledronic acid (RECLAST) 5 MG/100ML SOLN injection Inject 5 mg into the vein once.   No facility-administered encounter medications on file as of 08/06/2022.  Fill History:   Dispensed Days Supply Quantity Provider Pharmacy  ORENCIA  125 MG/ML SOSY 05/24/2022 28 4 mL      Dispensed Days Supply Quantity Provider Pharmacy  ALBUTEROL HFA INH (200 PUFFS) 8.5GM 06/30/2022 33 8.5 g      Dispensed Days Supply Quantity Provider Pharmacy  ALLOPURINOL 100MG TABLETS 07/20/2022 90 90 each      Dispensed Days Supply Quantity Provider Pharmacy  CLOBETASOL PROP 0.05% OINT 60GM 07/23/2022 25  60 g      Dispensed Days Supply Quantity Provider Pharmacy  KETOCONAZOLE 2% SHAMPOO 120ML 03/04/2020 15 120 mL      Dispensed Days Supply Quantity Provider Pharmacy  MEGESTROL 625MG/5ML SUSPENSION 05/17/2020 10 150 mL      Dispensed Days Supply Quantity Provider Pharmacy  METOPROLOL ER SUCCINATE 25MG TABS 07/19/2022 90 90 each      Dispensed Days Supply Quantity Provider Pharmacy  MUPIROCIN 2% OINTMENT 15GM 04/03/2022 20 30 g      Dispensed Days Supply Quantity Provider Pharmacy  ONDANSETRON ODT 8MG TABLETS 02/02/2022 20 60 each      Dispensed Days Supply Quantity Provider Pharmacy  POLYETH GLYC 3350 NF PWDR PACKS 02/21/2022 14 14 each      Dispensed Days Supply Quantity Provider Pharmacy  TIZANIDINE 4MG TABLETS 07/19/2022 90 90 each      Dispensed Days Supply Quantity Provider Pharmacy  TRAMADOL 50MG TABLETS 07/02/2022 23 180 each      Dispensed Days Supply Quantity Provider Pharmacy  TRIAMCINOLONE 0.1% LOTION 60ML 07/23/2022 15 60 mL     Recent Office Vitals: BP Readings from Last 3 Encounters:  07/23/22 120/82  07/02/22 (!) 146/72  01/01/22 120/74   Pulse Readings from Last 3 Encounters:  07/23/22 84  07/02/22 74  01/01/22 66    Wt Readings from Last 3 Encounters:  07/23/22 150 lb (68 kg)  07/02/22 150 lb (68 kg)  01/01/22 150 lb (68 kg)     Kidney Function Lab Results  Component Value Date/Time   CREATININE 0.72 07/23/2022 11:44 AM   CREATININE 0.85 07/10/2021 11:23 AM   CREATININE 0.76 11/19/2017 10:24 AM   GFR 87.16 07/23/2022 11:44 AM   GFRNONAA >60 07/10/2021 11:23 AM   GFRNONAA 85 11/19/2017 10:24 AM   GFRAA 98 11/19/2017 10:24 AM       Latest Ref Rng & Units 07/23/2022   11:44 AM 07/10/2021   11:23 AM 01/16/2021   11:45 AM  BMP  Glucose 70 - 99 mg/dL 91  91    BUN 6 - 23 mg/dL 11  9    Creatinine 0.40 - 1.20 mg/dL 0.72  0.85  0.70   Sodium 135 - 145 mEq/L 142  138    Potassium 3.5 - 5.1 mEq/L 4.2  3.9    Chloride 96 - 112 mEq/L 104  101     CO2 19 - 32 mEq/L 29  25    Calcium 8.4 - 10.5 mg/dL 9.8  9.3     [place] Pharmacist Assistant [phone]

## 2022-08-08 DIAGNOSIS — K5720 Diverticulitis of large intestine with perforation and abscess without bleeding: Secondary | ICD-10-CM | POA: Diagnosis not present

## 2022-08-08 DIAGNOSIS — R5381 Other malaise: Secondary | ICD-10-CM | POA: Diagnosis not present

## 2022-08-08 DIAGNOSIS — M069 Rheumatoid arthritis, unspecified: Secondary | ICD-10-CM | POA: Diagnosis not present

## 2022-08-08 DIAGNOSIS — M0549 Rheumatoid myopathy with rheumatoid arthritis of multiple sites: Secondary | ICD-10-CM | POA: Diagnosis not present

## 2022-08-11 DIAGNOSIS — M0549 Rheumatoid myopathy with rheumatoid arthritis of multiple sites: Secondary | ICD-10-CM | POA: Diagnosis not present

## 2022-08-11 DIAGNOSIS — R5381 Other malaise: Secondary | ICD-10-CM | POA: Diagnosis not present

## 2022-08-11 DIAGNOSIS — M069 Rheumatoid arthritis, unspecified: Secondary | ICD-10-CM | POA: Diagnosis not present

## 2022-08-11 DIAGNOSIS — K5720 Diverticulitis of large intestine with perforation and abscess without bleeding: Secondary | ICD-10-CM | POA: Diagnosis not present

## 2022-09-03 DIAGNOSIS — Z933 Colostomy status: Secondary | ICD-10-CM | POA: Diagnosis not present

## 2022-09-09 DIAGNOSIS — R5381 Other malaise: Secondary | ICD-10-CM | POA: Diagnosis not present

## 2022-09-09 DIAGNOSIS — M0549 Rheumatoid myopathy with rheumatoid arthritis of multiple sites: Secondary | ICD-10-CM | POA: Diagnosis not present

## 2022-09-09 DIAGNOSIS — K5720 Diverticulitis of large intestine with perforation and abscess without bleeding: Secondary | ICD-10-CM | POA: Diagnosis not present

## 2022-09-09 DIAGNOSIS — M069 Rheumatoid arthritis, unspecified: Secondary | ICD-10-CM | POA: Diagnosis not present

## 2022-09-26 ENCOUNTER — Inpatient Hospital Stay (HOSPITAL_COMMUNITY): Payer: 59

## 2022-09-26 ENCOUNTER — Encounter (HOSPITAL_COMMUNITY): Payer: Self-pay

## 2022-09-26 ENCOUNTER — Emergency Department (HOSPITAL_COMMUNITY): Payer: 59

## 2022-09-26 ENCOUNTER — Other Ambulatory Visit: Payer: Self-pay

## 2022-09-26 ENCOUNTER — Inpatient Hospital Stay (HOSPITAL_COMMUNITY)
Admission: EM | Admit: 2022-09-26 | Discharge: 2022-09-29 | DRG: 389 | Disposition: A | Discharge status: Home / Self Care | Payer: 59 | Attending: Internal Medicine | Admitting: Internal Medicine

## 2022-09-26 DIAGNOSIS — R1111 Vomiting without nausea: Secondary | ICD-10-CM | POA: Diagnosis not present

## 2022-09-26 DIAGNOSIS — R109 Unspecified abdominal pain: Secondary | ICD-10-CM | POA: Diagnosis not present

## 2022-09-26 DIAGNOSIS — Q8740 Marfan's syndrome, unspecified: Secondary | ICD-10-CM | POA: Diagnosis not present

## 2022-09-26 DIAGNOSIS — M1A9XX0 Chronic gout, unspecified, without tophus (tophi): Secondary | ICD-10-CM | POA: Diagnosis not present

## 2022-09-26 DIAGNOSIS — F1011 Alcohol abuse, in remission: Secondary | ICD-10-CM | POA: Diagnosis not present

## 2022-09-26 DIAGNOSIS — E785 Hyperlipidemia, unspecified: Secondary | ICD-10-CM | POA: Diagnosis present

## 2022-09-26 DIAGNOSIS — N179 Acute kidney failure, unspecified: Secondary | ICD-10-CM | POA: Diagnosis present

## 2022-09-26 DIAGNOSIS — I272 Pulmonary hypertension, unspecified: Secondary | ICD-10-CM | POA: Diagnosis not present

## 2022-09-26 DIAGNOSIS — K432 Incisional hernia without obstruction or gangrene: Secondary | ICD-10-CM | POA: Diagnosis present

## 2022-09-26 DIAGNOSIS — Z8616 Personal history of COVID-19: Secondary | ICD-10-CM | POA: Diagnosis not present

## 2022-09-26 DIAGNOSIS — K565 Intestinal adhesions [bands], unspecified as to partial versus complete obstruction: Principal | ICD-10-CM | POA: Diagnosis present

## 2022-09-26 DIAGNOSIS — F1021 Alcohol dependence, in remission: Secondary | ICD-10-CM | POA: Diagnosis present

## 2022-09-26 DIAGNOSIS — K219 Gastro-esophageal reflux disease without esophagitis: Secondary | ICD-10-CM | POA: Diagnosis not present

## 2022-09-26 DIAGNOSIS — R9431 Abnormal electrocardiogram [ECG] [EKG]: Secondary | ICD-10-CM | POA: Diagnosis not present

## 2022-09-26 DIAGNOSIS — R1114 Bilious vomiting: Secondary | ICD-10-CM

## 2022-09-26 DIAGNOSIS — Z8673 Personal history of transient ischemic attack (TIA), and cerebral infarction without residual deficits: Secondary | ICD-10-CM

## 2022-09-26 DIAGNOSIS — Z87891 Personal history of nicotine dependence: Secondary | ICD-10-CM | POA: Diagnosis not present

## 2022-09-26 DIAGNOSIS — Z9049 Acquired absence of other specified parts of digestive tract: Secondary | ICD-10-CM

## 2022-09-26 DIAGNOSIS — D84821 Immunodeficiency due to drugs: Secondary | ICD-10-CM | POA: Diagnosis not present

## 2022-09-26 DIAGNOSIS — Z4659 Encounter for fitting and adjustment of other gastrointestinal appliance and device: Secondary | ICD-10-CM | POA: Diagnosis not present

## 2022-09-26 DIAGNOSIS — K5909 Other constipation: Secondary | ICD-10-CM | POA: Diagnosis present

## 2022-09-26 DIAGNOSIS — Z91018 Allergy to other foods: Secondary | ICD-10-CM

## 2022-09-26 DIAGNOSIS — Z8719 Personal history of other diseases of the digestive system: Secondary | ICD-10-CM

## 2022-09-26 DIAGNOSIS — I4892 Unspecified atrial flutter: Secondary | ICD-10-CM | POA: Diagnosis present

## 2022-09-26 DIAGNOSIS — K5730 Diverticulosis of large intestine without perforation or abscess without bleeding: Secondary | ICD-10-CM | POA: Diagnosis not present

## 2022-09-26 DIAGNOSIS — Z933 Colostomy status: Secondary | ICD-10-CM

## 2022-09-26 DIAGNOSIS — M810 Age-related osteoporosis without current pathological fracture: Secondary | ICD-10-CM | POA: Diagnosis present

## 2022-09-26 DIAGNOSIS — Z96653 Presence of artificial knee joint, bilateral: Secondary | ICD-10-CM | POA: Diagnosis not present

## 2022-09-26 DIAGNOSIS — I5042 Chronic combined systolic (congestive) and diastolic (congestive) heart failure: Secondary | ICD-10-CM | POA: Diagnosis present

## 2022-09-26 DIAGNOSIS — K56609 Unspecified intestinal obstruction, unspecified as to partial versus complete obstruction: Secondary | ICD-10-CM

## 2022-09-26 DIAGNOSIS — H548 Legal blindness, as defined in USA: Secondary | ICD-10-CM | POA: Diagnosis not present

## 2022-09-26 DIAGNOSIS — K6389 Other specified diseases of intestine: Secondary | ICD-10-CM | POA: Diagnosis not present

## 2022-09-26 DIAGNOSIS — Z743 Need for continuous supervision: Secondary | ICD-10-CM | POA: Diagnosis not present

## 2022-09-26 DIAGNOSIS — M069 Rheumatoid arthritis, unspecified: Secondary | ICD-10-CM | POA: Diagnosis not present

## 2022-09-26 DIAGNOSIS — K5651 Intestinal adhesions [bands], with partial obstruction: Secondary | ICD-10-CM | POA: Diagnosis not present

## 2022-09-26 DIAGNOSIS — Z85528 Personal history of other malignant neoplasm of kidney: Secondary | ICD-10-CM

## 2022-09-26 DIAGNOSIS — Z79631 Long term (current) use of antimetabolite agent: Secondary | ICD-10-CM | POA: Diagnosis not present

## 2022-09-26 DIAGNOSIS — I509 Heart failure, unspecified: Secondary | ICD-10-CM

## 2022-09-26 DIAGNOSIS — I5020 Unspecified systolic (congestive) heart failure: Secondary | ICD-10-CM

## 2022-09-26 DIAGNOSIS — Z79899 Other long term (current) drug therapy: Secondary | ICD-10-CM

## 2022-09-26 DIAGNOSIS — I110 Hypertensive heart disease with heart failure: Secondary | ICD-10-CM | POA: Diagnosis not present

## 2022-09-26 DIAGNOSIS — K5720 Diverticulitis of large intestine with perforation and abscess without bleeding: Secondary | ICD-10-CM | POA: Diagnosis present

## 2022-09-26 DIAGNOSIS — R739 Hyperglycemia, unspecified: Secondary | ICD-10-CM | POA: Diagnosis present

## 2022-09-26 DIAGNOSIS — R000 Tachycardia, unspecified: Secondary | ICD-10-CM | POA: Diagnosis not present

## 2022-09-26 DIAGNOSIS — Z7982 Long term (current) use of aspirin: Secondary | ICD-10-CM

## 2022-09-26 LAB — COMPREHENSIVE METABOLIC PANEL
ALT: 18 U/L (ref 0–44)
AST: 29 U/L (ref 15–41)
Albumin: 4.5 g/dL (ref 3.5–5.0)
Alkaline Phosphatase: 89 U/L (ref 38–126)
Anion gap: 12 (ref 5–15)
BUN: 24 mg/dL — ABNORMAL HIGH (ref 8–23)
CO2: 29 mmol/L (ref 22–32)
Calcium: 10.2 mg/dL (ref 8.9–10.3)
Chloride: 97 mmol/L — ABNORMAL LOW (ref 98–111)
Creatinine, Ser: 0.96 mg/dL (ref 0.44–1.00)
GFR, Estimated: >60 mL/min (ref >60)
Glucose, Bld: 143 mg/dL — ABNORMAL HIGH (ref 70–99)
Potassium: 3.7 mmol/L (ref 3.5–5.1)
Sodium: 138 mmol/L (ref 135–145)
Total Bilirubin: 1.1 mg/dL (ref 0.3–1.2)
Total Protein: 8.5 g/dL — ABNORMAL HIGH (ref 6.5–8.1)

## 2022-09-26 LAB — CBC
HCT: 40.7 % (ref 36.0–46.0)
Hemoglobin: 12.9 g/dL (ref 12.0–15.0)
MCH: 30.6 pg (ref 26.0–34.0)
MCHC: 31.7 g/dL (ref 30.0–36.0)
MCV: 96.7 fL (ref 80.0–100.0)
Platelets: 330 10*3/uL (ref 150–400)
RBC: 4.21 MIL/uL (ref 3.87–5.11)
RDW: 13.7 % (ref 11.5–15.5)
WBC: 8.4 10*3/uL (ref 4.0–10.5)
nRBC: 0 % (ref 0.0–0.2)

## 2022-09-26 LAB — GLUCOSE, CAPILLARY: Glucose-Capillary: 141 mg/dL — ABNORMAL HIGH (ref 70–99)

## 2022-09-26 LAB — LIPASE, BLOOD: Lipase: 33 U/L (ref 11–51)

## 2022-09-26 MED ORDER — IOHEXOL 300 MG/ML  SOLN
80.0000 mL | Freq: Once | INTRAMUSCULAR | Status: AC | PRN
  Administered 2022-09-26: 80 mL via INTRAVENOUS

## 2022-09-26 MED ORDER — INSULIN ASPART 100 UNIT/ML IJ SOLN
0.0000 [IU] | INTRAMUSCULAR | Status: DC
  Administered 2022-09-26 – 2022-09-27 (×2): 1 [IU] via SUBCUTANEOUS
  Filled 2022-09-26: qty 0.09

## 2022-09-26 MED ORDER — PHENOL 1.4 % MT LIQD: 2.0000 | OROMUCOSAL | Status: DC | PRN

## 2022-09-26 MED ORDER — METOPROLOL TARTRATE 5 MG/5ML IV SOLN: 5.0000 mg | Freq: Four times a day (QID) | INTRAVENOUS | Status: DC | PRN

## 2022-09-26 MED ORDER — ALUM & MAG HYDROXIDE-SIMETH 200-200-20 MG/5ML PO SUSP: 30.0000 mL | Freq: Four times a day (QID) | ORAL | Status: DC | PRN

## 2022-09-26 MED ORDER — PROCHLORPERAZINE EDISYLATE 10 MG/2ML IJ SOLN
5.0000 mg | INTRAMUSCULAR | Status: DC | PRN
  Administered 2022-09-26: 10 mg via INTRAVENOUS
  Filled 2022-09-26: qty 2

## 2022-09-26 MED ORDER — MENTHOL 3 MG MT LOZG: 1.0000 | LOZENGE | OROMUCOSAL | Status: DC | PRN

## 2022-09-26 MED ORDER — MAGIC MOUTHWASH: 15.0000 mL | Freq: Four times a day (QID) | ORAL | Status: DC | PRN

## 2022-09-26 MED ORDER — MORPHINE SULFATE (PF) 2 MG/ML IV SOLN
2.0000 mg | Freq: Once | INTRAVENOUS | Status: AC
  Administered 2022-09-26: 2 mg via INTRAVENOUS
  Filled 2022-09-26: qty 1

## 2022-09-26 MED ORDER — KETOROLAC TROMETHAMINE 15 MG/ML IJ SOLN
15.0000 mg | Freq: Three times a day (TID) | INTRAMUSCULAR | Status: AC | PRN
  Administered 2022-09-27 (×2): 15 mg via INTRAVENOUS
  Filled 2022-09-26 (×2): qty 1

## 2022-09-26 MED ORDER — DIATRIZOATE MEGLUMINE & SODIUM 66-10 % PO SOLN
90.0000 mL | Freq: Once | ORAL | Status: AC
  Administered 2022-09-26: 90 mL via NASOGASTRIC
  Filled 2022-09-26: qty 90

## 2022-09-26 MED ORDER — LIDOCAINE VISCOUS HCL 2 % MT SOLN
15.0000 mL | Freq: Once | OROMUCOSAL | Status: DC
  Filled 2022-09-26: qty 15

## 2022-09-26 MED ORDER — LACTATED RINGERS IV BOLUS
1000.0000 mL | Freq: Once | INTRAVENOUS | Status: AC
  Administered 2022-09-26: 1000 mL via INTRAVENOUS

## 2022-09-26 MED ORDER — LIP MEDEX EX OINT
TOPICAL_OINTMENT | Freq: Two times a day (BID) | CUTANEOUS | Status: DC
  Administered 2022-09-27: 75 via TOPICAL
  Administered 2022-09-28: 1 via TOPICAL
  Filled 2022-09-26: qty 7

## 2022-09-26 MED ORDER — ONDANSETRON 8 MG/NS 50 ML IVPB: 8.0000 mg | Freq: Four times a day (QID) | INTRAVENOUS | Status: DC | PRN

## 2022-09-26 MED ORDER — LACTATED RINGERS IV BOLUS: 1000.0000 mL | Freq: Three times a day (TID) | INTRAVENOUS | Status: AC | PRN

## 2022-09-26 MED ORDER — LACTATED RINGERS IV SOLN: INTRAVENOUS | Status: AC

## 2022-09-26 MED ORDER — PANTOPRAZOLE SODIUM 40 MG IV SOLR
40.0000 mg | INTRAVENOUS | Status: DC
  Administered 2022-09-26 – 2022-09-28 (×3): 40 mg via INTRAVENOUS
  Filled 2022-09-26 (×3): qty 10

## 2022-09-26 MED ORDER — ACETAMINOPHEN 650 MG RE SUPP: 650.0000 mg | Freq: Four times a day (QID) | RECTAL | Status: DC | PRN

## 2022-09-26 MED ORDER — HYDROMORPHONE HCL 1 MG/ML IJ SOLN: 0.5000 mg | INTRAMUSCULAR | Status: DC | PRN

## 2022-09-26 MED ORDER — LACTATED RINGERS IV BOLUS: 500.0000 mL | Freq: Once | INTRAVENOUS | Status: DC

## 2022-09-26 MED ORDER — METHOCARBAMOL 1000 MG/10ML IJ SOLN: 1000.0000 mg | Freq: Four times a day (QID) | INTRAVENOUS | Status: DC | PRN

## 2022-09-26 MED ORDER — ONDANSETRON HCL 4 MG/2ML IJ SOLN
4.0000 mg | Freq: Four times a day (QID) | INTRAMUSCULAR | Status: DC | PRN
  Administered 2022-09-27: 4 mg via INTRAVENOUS
  Filled 2022-09-26: qty 2

## 2022-09-26 MED ORDER — ENOXAPARIN SODIUM 40 MG/0.4ML IJ SOSY
40.0000 mg | PREFILLED_SYRINGE | INTRAMUSCULAR | Status: DC
  Administered 2022-09-26 – 2022-09-28 (×3): 40 mg via SUBCUTANEOUS
  Filled 2022-09-26 (×3): qty 0.4

## 2022-09-26 MED ORDER — DIATRIZOATE MEGLUMINE & SODIUM 66-10 % PO SOLN: 90.0000 mL | Freq: Once | ORAL | Status: DC

## 2022-09-26 MED ORDER — ONDANSETRON HCL 4 MG/2ML IJ SOLN
4.0000 mg | Freq: Once | INTRAMUSCULAR | Status: AC
  Administered 2022-09-26: 4 mg via INTRAVENOUS
  Filled 2022-09-26: qty 2

## 2022-09-26 MED ORDER — HYDROMORPHONE HCL 1 MG/ML IJ SOLN
0.5000 mg | INTRAMUSCULAR | Status: DC | PRN
  Administered 2022-09-26 – 2022-09-29 (×5): 1 mg via INTRAVENOUS
  Filled 2022-09-26 (×5): qty 1

## 2022-09-26 MED ORDER — SIMETHICONE 40 MG/0.6ML PO SUSP: 80.0000 mg | Freq: Four times a day (QID) | ORAL | Status: DC | PRN

## 2022-09-26 NOTE — ED Provider Notes (Signed)
[place] EMERGENCY DEPARTMENT AT [facility] Provider Note   CSN: [id number] Arrival date & time: 09/26/22  1621     History  Chief Complaint  Patient presents with   Abdominal Pain    [name] is a 67 y.o. female.  67 year old female with a history of diverticulitis with perforation status post colectomy and colostomy, SBO, and hernia repair presents emergency department with abdominal pain and nausea and vomiting.  Patient reports that last night she started feeling that she was constipated.  Since then has had worsening diffuse abdominal pain and green nausea and vomiting.  Has had decreased output from her ostomy as well.  Feels similar to prior small bowel obstructions.        Home Medications Prior to Admission medications   Medication Sig Start Date End Date Taking? Authorizing Provider  Abatacept 125 MG/ML SOSY Inject 125 mg into the skin once a week. Has on Sunday    [provider]  albuterol (VENTOLIN HFA) 108 (90 Base) MCG/ACT inhaler INHALE 1 PUFF INTO THE LUNGS EVERY 4 HOURS AS NEEDED FOR WHEEZING OR SHORTNESS OF BREATH 01/02/22   [name], MD  allopurinol (ZYLOPRIM) 100 MG tablet TAKE 1 TABLET BY MOUTH EVERY DAY 07/20/22   [name], MD  aspirin EC 81 MG tablet Take 1 tablet (81 mg total) by mouth daily. 05/11/14   Verlee Monte, MD  CALCIUM PO Take 1 tablet by mouth 2 (two) times daily. 600 mg of calcium with vitamin D    [provider]  cetirizine (ZYRTEC) 10 MG tablet Take 1 tablet (10 mg total) by mouth daily. 11/17/14   [name], MD  Cholecalciferol (VITAMIN D) 2000 units CAPS Take 2,000 Units by mouth daily.    [provider]  ciprofloxacin-dexamethasone (CIPRODEX) OTIC suspension Place 4 drops into both ears 2 (two) times daily. 10/17/21   [name], MD  clobetasol ointment (TEMOVATE) [id number] % Apply 1 Application topically 2 (two) times daily. 07/23/22   [name], MD  diclofenac sodium  (VOLTAREN) 1 % GEL Apply 1 application topically 4 (four) times daily as needed (arthritis).    [provider]  docusate sodium (COLACE) 100 MG capsule Take 100 mg by mouth 2 (two) times daily.    [provider]  esomeprazole (NEXIUM) 40 MG capsule TAKE 1 CAPSULE(40 MG) BY MOUTH DAILY IN THE AFTERNOON 07/20/22   [name], MD  folic acid (FOLVITE) [id number] MCG tablet Take 400 mcg by mouth daily.    [provider]  HYDROcodone-acetaminophen (NORCO) 10-325 MG tablet Take 1 tablet by mouth every 6 (six) hours as needed for moderate pain. 09/30/22 10/30/22  [name], MD  ketoconazole (NIZORAL) 2 % shampoo Apply 1 application topically 2 (two) times a week. 03/04/20   [provider]  megestrol (MEGACE ES) 625 MG/5ML suspension SHAKE LIQUID AND TAKE 5 ML BY MOUTH THREE TIMES DAILY BEFORE MEALS AS NEEDED FOR APPETITE 10/03/21   [name], MD  Methotrexate Sodium (METHOTREXATE, PF,) 50 MG/2ML injection Inject 0.5 mLs into the muscle once a week. Sunday 05/14/16   [provider]  metoprolol succinate (TOPROL-XL) 25 MG 24 hr tablet Take 1 tablet (25 mg total) by mouth daily. 01/19/22   [name], MD  Multiple Vitamin (MULTIVITAMIN WITH MINERALS) TABS tablet Take 1 tablet by mouth daily.    [provider]  mupirocin ointment (BACTROBAN) 2 % Apply 1 Application topically 2 (  two) times daily. 01/01/22   [name], MD  ondansetron (ZOFRAN-ODT) 8 MG disintegrating tablet Take 1 tablet (8 mg total) by mouth every 8 (eight) hours as needed for nausea or vomiting. 01/19/22   [name], MD  PERIDEX 0.12 % solution Use as directed 15 mLs in the mouth or throat 2 (two) times daily.  03/23/20   [provider]  polyethylene glycol (MIRALAX / GLYCOLAX) 17 g packet Take 17 g by mouth daily as needed for mild constipation. 05/05/21   [name], MD  predniSONE (DELTASONE) 5 MG tablet Take 5-10 mg by mouth daily as needed.    [provider]  temazepam (RESTORIL) 30 MG capsule 1 capsule at bedtime as needed    [provider]  tiZANidine (ZANAFLEX) 4 MG tablet TAKE 1 TABLET(4 MG) BY MOUTH TWICE DAILY 02/28/22   [name], MD  traMADol (ULTRAM) 50 MG tablet TAKE 1 TO 2 TABLETS(50 TO 100 MG) BY MOUTH EVERY 6 HOURS AS NEEDED 04/10/22   [name], MD  triamcinolone lotion (KENALOG) 0.1 % Apply topically 2 (two) times daily. 07/23/22   [name], MD  vitamin B-12 (CYANOCOBALAMIN) 500 MCG tablet Take 500 mcg by mouth daily.    [provider]  vitamin C (ASCORBIC ACID) 500 MG tablet Take 500 mg by mouth daily.     [provider]  zoledronic acid (RECLAST) 5 MG/100ML SOLN injection Inject 5 mg into the vein once.    [provider]      Allergies    Spinach    Review of Systems   Review of Systems  Physical Exam Updated Vital Signs BP 121/84   Pulse (!) 103   Temp 99.5 F (37.5 C) (Oral)   Resp 19   Ht 5\' 8"  (1.727 m)   Wt 68 kg   LMP 06/25/2006 (LMP Unknown)   SpO2 95%   BMI 22.81 kg/m  Physical Exam Vitals and nursing note reviewed.  Constitutional:      General: She is not in acute distress.    Appearance: She is well-developed.  HENT:     Head: Normocephalic and atraumatic.     Right Ear: External ear normal.     Left Ear: External ear normal.     Nose: Nose normal.  Eyes:     Extraocular Movements: Extraocular movements intact.     Conjunctiva/sclera: Conjunctivae normal.     Pupils: Pupils are equal, round, and reactive to light.  Cardiovascular:     Rate and Rhythm: Normal rate and regular rhythm.     Heart sounds: No murmur heard. Pulmonary:     Effort: Pulmonary effort is normal. No respiratory distress.  Abdominal:     General: Abdomen is flat. There is no distension.     Palpations: Abdomen is soft. There is no mass.     Tenderness: There is abdominal tenderness (Diffuse). There is no guarding.     Hernia: A hernia (Ventral reducible)  is present.     Comments: Ostomy with brown stool  Musculoskeletal:     Cervical back: Normal range of motion and neck supple.  Skin:    General: Skin is warm and dry.  Neurological:     Mental Status: She is alert and oriented to person, place, and time. Mental status is at baseline.  Psychiatric:        Mood and Affect: Mood normal.     ED Results / Procedures /  Treatments   Labs (all labs ordered are listed, but only abnormal results are displayed) Labs Reviewed  COMPREHENSIVE METABOLIC PANEL - Abnormal; Notable for the following components:      Result Value   Chloride 97 (*)    Glucose, Bld 143 (*)    BUN 24 (*)    Total Protein 8.5 (*)    All other components within normal limits  LIPASE, BLOOD  CBC  URINALYSIS, ROUTINE W REFLEX MICROSCOPIC    EKG None  Radiology No results found.  Procedures Procedures   Medications Ordered in ED Medications - No data to display  ED Course/ Medical Decision Making/ A&P Clinical Course as of 09/26/22 1923  Wed Sep 26, 2022  1909 Dr Johney Maine from general surgery recommends NG tube placement and medicine admission. [RP]  1922 Dr Nevada Crane from hospitalist to evaluate for admission. [RP]    Clinical Course User Index [RP] Fransico Meadow, MD                             Medical Decision Making Amount and/or Complexity of Data Reviewed Labs: ordered. Radiology: ordered.  Risk Prescription drug management. Decision regarding hospitalization.   MARGEL TALAMANTES is a 67 y.o. female with comorbidities that complicate the patient evaluation including history of diverticulitis with perforation status post colectomy and colostomy, SBO, and hernia repair presents emergency department with abdominal pain and nausea and vomiting.    Initial Ddx:  SBO, diverticulitis, gastroenteritis, dehydration  MDM:  Feel patient likely has small bowel obstruction based on her symptoms.  Will obtain CT with IV contrast to evaluate for recurrent  diverticulitis or abscess but feel this is less likely.  Will also obtain x-ray since CT has been delayed today.  Plan:  Labs Abdominal x-ray CT abdomen with IV contrast Morphine Zofran  ED Summary/Re-evaluation:  Patient's x-ray showed small bowel obstruction on radiologist read.  Did discuss with general surgery who recommended NG tube placement.  This was confirmed by her CT.  Patient admitted to medicine for further management.  This patient presents to the ED for concern of complaints listed in HPI, this involves an extensive number of treatment options, and is a complaint that carries with it a high risk of complications and morbidity. Disposition including potential need for admission considered.   Dispo: DC Home. Return precautions discussed including, but not limited to, those listed in the AVS. Allowed pt time to ask questions which were answered fully prior to dc.  Records reviewed Outpatient Clinic Notes The following labs were independently interpreted: Chemistry and show no acute abnormality I independently reviewed the following imaging with scope of interpretation limited to determining acute life threatening conditions related to emergency care: CT Abdomen/Pelvis and agree with the radiologist interpretation with the following exceptions: none I personally reviewed and interpreted cardiac monitoring: sinus tachycardia I personally reviewed and interpreted the pt's EKG: see above for interpretation  I have reviewed the patients home medications and made adjustments as needed Consults: General Surgery and Hospitalist Social Determinants of health:  Elderly   Final Clinical Impression(s) / ED Diagnoses Final diagnoses:  Small bowel obstruction, nausea and vomiting, history of colectomy    Rx / DC Orders ED Discharge Orders     None         Fransico Meadow, MD 09/26/22 1926

## 2022-09-26 NOTE — ED Notes (Signed)
Patient transported to CT 

## 2022-09-26 NOTE — ED Triage Notes (Signed)
Pt BIB EMS with c/o ABD pain and n/v for the past couple of days. Pt has a colostomy. Pt has hx of SBO. Pt is legally blin and HOH.

## 2022-09-26 NOTE — H&P (Addendum)
History and Physical  [name] [id number] DOB: 09-12-1955 DOA: 09/26/2022  Referring physician: Dr. Philip Aspen, [place]  PCP: [name], MD  Outpatient Specialists: General surgery. Patient coming from: Home.  Chief Complaint: Nausea vomiting abdominal pain.  HPI: [name] is a 67 y.o. female with medical history significant for blindness, diverticulitis with perforation status post colectomy and colostomy, history of small bowel obstruction, hernia repair, essential hypertension, hyperlipidemia, iron deficiency anemia, rheumatoid arthritis on methotrexate weekly, history of CVA, chronic diastolic CHF, who presented to [facility] ED with complaints of persistent nausea vomiting and abdominal pain with onset last night. Symptoms are similar to prior SBO.  Associated with low stool output from her colostomy bag.  No reported subjective fevers or chills.  Presented to the ED for further evaluation.  In the ED, vital signs notable for a temperature of 99.5, tachycardia with pulse of 116.  Lab studies essentially unremarkable.  Abdominal x-ray done in the ED revealed L distended bowel with fluid level raising concern for bowel obstruction.  A follow-up CT scan showed findings consistent with high-grade small bowel obstruction with transition point in the left lower quadrant, similar location as compared with previous exams and potentially due to adhesive disease.  Mid to distal small bowel is completely decompressed beyond the transition point.  EDP discussed the case with general surgery, Dr. Johney Maine, who recommended NG tube placement and admission by medicine service.  General surgery will see in consultation.  SBO protocol initiated, pending NG tube placement.  TR[facility], hospitalist service, was asked to admit.  ED Course: Tmax 99.5.  BP 131/85, pulse 110, respiratory rate 14, saturation 98% on room air.  Lab studies remarkable for serum glucose 143, BUN 24.  CBC essentially unremarkable.  Review  of Systems: Review of systems as noted in the HPI. All other systems reviewed and are negative.   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   bilateral    Past Surgical History:  Procedure Laterality Date   ABLATION  01/04/2014   atrial flutter ablation (2 circuits) by Dr Lovena Le   ATRIAL FLUTTER ABLATION N/A 01/04/2014   Procedure: ATRIAL FLUTTER ABLATION;  Surgeon: Evans Lance, MD;  Location: [facility] CATH LAB;  Service: Cardiovascular;  Laterality: N/A;   BUNIONECTOMY Bilateral 06/2008   COLECTOMY WITH COLOSTOMY CREATION/HARTMANN PROCEDURE  11/27/2012   colectomy   COLOSTOMY  11/27/2012   HERNIA REPAIR     IR RADIOLOGIST EVAL & MGMT  10/01/2017  IR RADIOLOGIST EVAL & MGMT  11/21/2017   IR RADIOLOGIST EVAL & MGMT  02/13/2018   IR RADIOLOGIST EVAL & MGMT  01/08/2019   IR RADIOLOGIST EVAL & MGMT  12/29/2019   IR RADIOLOGIST EVAL & MGMT  01/31/2021   IR RADIOLOGIST EVAL & MGMT  12/20/2021   LAPAROSCOPIC ABDOMINAL EXPLORATION N/A 01/06/2013   Procedure: LAPAROSCOPIC ABDOMINAL EXPLORATION;  Surgeon: Adin Hector, MD;  Location: [facility]  ORS;  Service: General;  Laterality: N/A;   LAPAROSCOPIC LYSIS OF ADHESIONS N/A 01/06/2013   Procedure: LAPAROSCOPIC LYSIS OF ADHESIONS/ INTEROTOMY REPAIR;  Surgeon: Adin Hector, MD;  Location: [facility] ORS;  Service: General;  Laterality: N/A;   LAPAROTOMY N/A 11/27/2012   Procedure: EXPLORATORY LAPAROTOMY SIGMOID COLECTOMY, COLOSTOMY;  Surgeon: Madilyn Hook, DO;  Location: [facility] ORS;  Service: General;  Laterality: N/A;   RADIOLOGY WITH ANESTHESIA Left 10/23/2017   Procedure: CT RENAL CRYO AND BIOPSY;  Surgeon: Aletta Edouard, MD;  Location: [facility] ORS;  Service: Radiology;  Laterality: Left;   TOTAL KNEE ARTHROPLASTY Bilateral 11/2006    Social History:  reports that she quit smoking about 9 years ago. Her smoking use included cigarettes. She smoked an average of 1 pack per day. She has never used smokeless tobacco. She reports current drug use. Drug: Hydrocodone. She reports that she does not drink alcohol.   Allergies  Allergen Reactions   Spinach Itching, Rash and Other (See Comments)    Welts.    Family History  Problem Relation Age of Onset   Heart attack Neg Hx    Colon cancer Neg Hx    Osteoporosis Neg Hx       Prior to Admission medications   Medication Sig Start Date End Date Taking? Authorizing Provider  Abatacept 125 MG/ML SOSY Inject 125 mg into the skin once a week. Has on Sunday    [provider]  albuterol (VENTOLIN HFA) 108 (90 Base) MCG/ACT inhaler INHALE 1 PUFF INTO THE LUNGS EVERY 4 HOURS AS NEEDED FOR WHEEZING OR SHORTNESS OF BREATH 01/02/22   [name], MD  allopurinol (ZYLOPRIM) 100 MG tablet TAKE 1 TABLET BY MOUTH EVERY DAY 07/20/22   [name], MD  aspirin EC 81 MG tablet Take 1 tablet (81 mg total) by mouth daily. 05/11/14   Verlee Monte, MD  CALCIUM PO Take 1 tablet by mouth 2 (two) times daily. 600 mg of calcium with vitamin D    [provider]  cetirizine (ZYRTEC) 10 MG tablet Take 1 tablet (10 mg total) by mouth daily. 11/17/14    [name], MD  Cholecalciferol (VITAMIN D) 2000 units CAPS Take 2,000 Units by mouth daily.    [provider]  ciprofloxacin-dexamethasone (CIPRODEX) OTIC suspension Place 4 drops into both ears 2 (two) times daily. 10/17/21   [name], MD  clobetasol ointment (TEMOVATE) [id number] % Apply 1 Application topically 2 (two) times daily. 07/23/22   [name], MD  diclofenac sodium (VOLTAREN) 1 % GEL Apply 1 application topically 4 (four) times daily as needed (arthritis).    [provider]  docusate sodium (COLACE) 100 MG capsule Take 100 mg by mouth 2 (two) times daily.    [provider]  esomeprazole (NEXIUM) 40 MG capsule TAKE 1 CAPSULE(40 MG) BY MOUTH DAILY IN THE AFTERNOON 07/20/22   [name], MD  folic acid (FOLVITE) [id number] MCG tablet Take 400 mcg by mouth daily.    [provider]  HYDROcodone-acetaminophen (NORCO) 10-325 MG  tablet Take 1 tablet by mouth every 6 (six) hours as needed for moderate pain. 09/30/22 10/30/22  [name], MD  ketoconazole (NIZORAL) 2 % shampoo Apply 1 application topically 2 (two) times a week. 03/04/20   [provider]  megestrol (MEGACE ES) 625 MG/5ML suspension SHAKE LIQUID AND TAKE 5 ML BY MOUTH THREE TIMES DAILY BEFORE MEALS AS NEEDED FOR APPETITE 10/03/21   [name], MD  Methotrexate Sodium (METHOTREXATE, PF,) 50 MG/2ML injection Inject 0.5 mLs into the muscle once a week. Sunday 05/14/16   [provider]  metoprolol succinate (TOPROL-XL) 25 MG 24 hr tablet Take 1 tablet (25 mg total) by mouth daily. 01/19/22   [name], MD  Multiple Vitamin (MULTIVITAMIN WITH MINERALS) TABS tablet Take 1 tablet by mouth daily.    [provider]  mupirocin ointment (BACTROBAN) 2 % Apply 1 Application topically 2 (two) times daily. 01/01/22   [name], MD  ondansetron (ZOFRAN-ODT) 8 MG disintegrating tablet Take 1 tablet (8 mg total) by mouth every 8 (eight) hours as needed for nausea or  vomiting. 01/19/22   [name], MD  PERIDEX 0.12 % solution Use as directed 15 mLs in the mouth or throat 2 (two) times daily.  03/23/20   [provider]  polyethylene glycol (MIRALAX / GLYCOLAX) 17 g packet Take 17 g by mouth daily as needed for mild constipation. 05/05/21   [name], MD  predniSONE (DELTASONE) 5 MG tablet Take 5-10 mg by mouth daily as needed.    [provider]  temazepam (RESTORIL) 30 MG capsule 1 capsule at bedtime as needed    [provider]  tiZANidine (ZANAFLEX) 4 MG tablet TAKE 1 TABLET(4 MG) BY MOUTH TWICE DAILY 02/28/22   [name], MD  traMADol (ULTRAM) 50 MG tablet TAKE 1 TO 2 TABLETS(50 TO 100 MG) BY MOUTH EVERY 6 HOURS AS NEEDED 04/10/22   [name], MD  triamcinolone lotion (KENALOG) 0.1 % Apply topically 2 (two) times daily. 07/23/22   [name], MD  vitamin B-12 (CYANOCOBALAMIN) 500 MCG tablet Take 500 mcg by mouth daily.    [provider]  vitamin C (ASCORBIC ACID) 500 MG tablet Take 500 mg by mouth daily.     [provider]  zoledronic acid (RECLAST) 5 MG/100ML SOLN injection Inject 5 mg into the vein once.    [provider]    Physical Exam: BP 131/85   Pulse (!) 110   Temp 99.5 F (37.5 C) (Oral)   Resp 14   Ht 5\' 8"  (1.727 m)   Wt 68 kg   LMP 06/25/2006 (LMP Unknown)   SpO2 98%   BMI 22.81 kg/m   General: 67 y.o. year-old female well developed well nourished in no acute distress.  Alert and oriented x3. Cardiovascular: Regular rate and rhythm with no rubs or gallops.  No thyromegaly or JVD noted.  No lower extremity edema. 2/4 pulses in all 4 extremities. Respiratory: Clear to auscultation with no wheezes or rales. Good inspiratory effort. Abdomen: Soft Hypoactive bowel sounds, colostomy bag in place. Muskuloskeletal: No cyanosis, clubbing or edema noted bilaterally Neuro: CN II-XII intact, strength, sensation, reflexes Skin: No ulcerative lesions noted or  rashes Psychiatry: Judgement and insight appear normal. Mood is appropriate for condition and setting          Labs on Admission:  Basic Metabolic Panel: Recent Labs  Lab 09/26/22 1637  NA 138  K 3.7  CL 97*  CO2 29  GLUCOSE 143*  BUN 24*  CREATININE 0.96  CALCIUM 10.2   Liver Function Tests: Recent Labs  Lab 09/26/22 1637  AST 29  ALT 18  ALKPHOS 89  BILITOT 1.1  PROT 8.5*  ALBUMIN 4.5   Recent Labs  Lab 09/26/22 1637  LIPASE 33   No results for input(s): "AMMONIA" in the last 168 hours. CBC: Recent Labs  Lab 09/26/22 1637  WBC 8.4  HGB 12.9  HCT 40.7  MCV 96.7  PLT 330   Cardiac Enzymes: No results for input(s): "CKTOTAL", "CKMB", "CKMBINDEX", "TROPONINI" in the last 168 hours.  BNP (last 3 results) No results for input(s): "BNP" in the last 8760 hours.  ProBNP (last 3 results) No results for input(s): "PROBNP" in the last 8760 hours.  CBG: No results for input(s): "GLUCAP" in the last 168 hours.  Radiological Exams on Admission: CT ABDOMEN PELVIS W CONTRAST  Result Date: 09/26/2022 CLINICAL DATA:  Colostomy vomiting nausea EXAM: CT ABDOMEN AND PELVIS WITH CONTRAST TECHNIQUE: Multidetector CT imaging of the abdomen and pelvis was performed using the standard protocol following bolus administration of intravenous contrast. RADIATION DOSE REDUCTION: This exam was performed according to the departmental dose-optimization program which includes automated exposure control, adjustment of the mA and/or kV according to patient size and/or use of iterative reconstruction technique. Technical malfunction resulted in only delayed phase imaging. CONTRAST:  24mL OMNIPAQUE IOHEXOL 300 MG/ML  SOLN COMPARISON:  12/18/2021, 12/08/2021, 01/16/2021, 07/04/2020 and previous exams dating back to 2016. FINDINGS: Lower chest: Lung bases demonstrate no acute airspace disease. Coronary vascular calcification. Hepatobiliary: No calcified gallstones. No biliary dilatation. Hepatic  cysts. Pancreas: No inflammation. Stable dilatation of the proximal duct measuring up to 7 mm. Spleen: Normal in size without focal abnormality. Adrenals/Urinary Tract: Adrenal glands are within normal limits. Limited assessment of kidneys due to delayed phase imaging. Post treatment changes at the posterior cortex of left kidney without significant change. No hydronephrosis. The bladder is unremarkable aside from small posterior bladder diverticula. Stomach/Bowel: Fluid distension of the distal stomach, duodenum and jejunum. Fluid-filled distended proximal small bowel measuring up to 4.4 cm. Transition to decompressed small bowel in the left lower quadrant, series 2, image 51, similar location as compared with previous exams. The small bowel distal to the transition point is completely decompressed. Negative appendix. Left abdominal colostomy. Diverticular disease of the left colon. Vascular/Lymphatic: Advanced aortic atherosclerosis. No aneurysm. No suspicious lymph nodes. Reproductive: Uterus unremarkable.  No adnexal mass. Other: Negative for free air or free fluid. Wide neck ventral hernia containing colon and small bowel as well as mesenteric fat, this does not appear to be the source of obstruction. Musculoskeletal: Severe scoliosis and multilevel degenerative changes. Advanced bilateral hip arthritis. Chronic inferior pubic ramus fracture on the right. IMPRESSION: 1. Findings consistent with high-grade small bowel obstruction with transition point in the left lower quadrant, similar location as compared with previous exams and potentially due to adhesive disease. Mid to distal small bowel is completely decompressed beyond the transition point. 2. Left abdominal colostomy. Diverticular disease of the colon without acute inflammatory process. 3. Wide neck ventral hernia containing colon, small bowel and mesenteric fat but without signs for incarceration or obstruction related to this finding. 4. Stable post  treatment changes of the left kidney. 5. Aortic atherosclerosis. Aortic Atherosclerosis (ICD10-I70.0). Electronically Signed   By: Donavan Foil M.D.   On: 09/26/2022 19:15   DG Abdomen 1 View  Result Date: 09/26/2022 CLINICAL DATA:  Nausea vomiting EXAM: ABDOMEN - 1 VIEW COMPARISON:  07/07/2020, CT 07/10/2021 FINDINGS: Right-side-up decubitus view obtained. No gross free air. Air distended bowel with fluid levels raising concern for bowel obstruction. IMPRESSION: Air distended bowel with fluid levels raising concern for bowel obstruction. Electronically Signed   By: Donavan Foil M.D.   On: 09/26/2022 18:00    EKG: I independently viewed the EKG done and my findings are as followed: Sinus tachycardia rate of 101.  Nonspecific ST-T changes.  QTc 516.  Assessment/Plan Present on Admission:  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 11/28/2012  Small bowel obstruction due to adhesions  Legally blind  Rheumatoid arthritis ([place])  Incisional hernia  Chronic combined systolic and diastolic heart failure  Pulmonary HTN (HCC)  Constipation, chronic  ABUSE, ALCOHOL, IN REMISSION  Principal Problem:   Small bowel obstruction due to adhesions Active Problems:   ABUSE, ALCOHOL, IN REMISSION   Legally blind   Rheumatoid arthritis (HCC)   Marfan's syndrome   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 11/28/2012   Incisional hernia   Constipation, chronic   Pulmonary HTN (HCC)   Chronic combined systolic and diastolic heart failure   CHF (congestive heart failure)   Colostomy in place  Small bowel obstruction with transition point in the left lower quadrant seen on CT scan, POA. History of prior abdominal surgeries NG tube placement is pending on 09/26/2022. SBO protocol in place General surgery consulted, Dr. Johney Maine, will see in consultation. IV fluid hydration LR at 75 cc/h x 2-day Optimize magnesium level, goal greater than 2.0 Optimize potassium level,  goal greater than 4.0. IV antiemetics as needed IV analgesics as needed Early mobilization Follow repeat abdominal x-ray in the morning  QTc prolongation Admission 12 EKG showed QTc 560 Avoid QTc prolonging agents Optimize magnesium and potassium levels Monitor on telemetry  Essential hypertension Hold off home oral antihypertensive due to n.p.o. IV Lopressor as needed with parameters Closely monitor vital signs  GERD IV Protonix daily.  Rheumatoid arthritis Prior to admission receives methotrexate injections weekly.  Hyperglycemia, no prior history of diabetes Last hemoglobin A1c 5.5 on 07/23/2022. Closely monitor Insulin sliding scale only for hyperglycemia with blood sugar greater than 140      DVT prophylaxis: Subcu Lovenox daily  Code Status: Full code  Family Communication: None at bedside.  Disposition Plan: Admitted to MedSurg unit with telemetry  Consults called: General surgery  Admission status: Inpatient status.   Status is: Inpatient The patient requires at least 2 midnights for further evaluation and treatment of present condition.   Kayleen Memos MD [facility] Pager [phone]  If 7PM-7AM, please contact night-coverage [email or web address] Password [facility]  09/26/2022, 7:27 PM

## 2022-09-26 NOTE — ED Notes (Signed)
ED TO INPATIENT HANDOFF REPORT  ED Nurse Name and Phone #: Lorelee New Name/Age/Gender [name] 67 y.o. female Room/Bed: WA17/WA17  Code Status   Code Status: Full Code  Home/SNF/Other Home Patient oriented to: self, place, time, and situation Is this baseline? Yes   Triage Complete: Triage complete  Chief Complaint SBO (small bowel obstruction) [K56.609]  Triage Note Pt BIB EMS with c/o ABD pain and n/v for the past couple of days. Pt has a colostomy. Pt has hx of SBO. Pt is legally blin and HOH.    Allergies Allergies  Allergen Reactions   Spinach Itching, Rash and Other (See Comments)    Welts.    Level of Care/Admitting Diagnosis ED Disposition     ED Disposition  Admit   Condition  --   Comment  Hospital Area: [place] [[id number]]  Level of Care: Med-Surg [16]  May admit patient to [facility] or [facility] if equivalent level of care is available:: Yes  Covid Evaluation: Asymptomatic - no recent exposure (last 10 days) testing not required  Diagnosis: SBO (small bowel obstruction) [id number]  Admitting Physician: Kayleen Memos [id number]  Attending Physician: Kayleen Memos [id number]  Certification:: I certify this patient will need inpatient services for at least 2 midnights  Estimated Length of Stay: 2          B Medical/Surgery History Past Medical History:  Diagnosis Date   Atrial flutter    CHF (congestive heart failure)    Chronic gout 2008   COVID-19 virus infection 07/04/2020   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 11/28/2012 01/05/2013   Diverticulitis of large intestine with perforation    Dysrhythmia    Fibroid    Hearing aid worn    pt wears bilateral hearing aids   Hernia 09/2006   Hx of adenomatous polyp of colon 11/13/2016   Hypertension    Legally blind    since pt was a teenager   Marfan's syndrome affecting skin    with scolosis   Osteoporosis    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   bilateral    Past Surgical History:  Procedure Laterality Date   ABLATION  01/04/2014   atrial flutter ablation (2 circuits) by Dr Lovena Le   ATRIAL FLUTTER ABLATION N/A 01/04/2014   Procedure: ATRIAL FLUTTER ABLATION;  Surgeon: Evans Lance, MD;  Location: [facility] CATH LAB;  Service: Cardiovascular;  Laterality: N/A;   BUNIONECTOMY Bilateral 06/2008   COLECTOMY WITH COLOSTOMY CREATION/HARTMANN PROCEDURE  11/27/2012   colectomy   COLOSTOMY  11/27/2012   HERNIA REPAIR     IR RADIOLOGIST EVAL & MGMT  10/01/2017   IR RADIOLOGIST EVAL & MGMT  11/21/2017   IR RADIOLOGIST EVAL & MGMT  02/13/2018   IR RADIOLOGIST EVAL & MGMT  01/08/2019   IR RADIOLOGIST EVAL & MGMT  12/29/2019   IR RADIOLOGIST EVAL & MGMT  01/31/2021   IR RADIOLOGIST EVAL & MGMT  12/20/2021   LAPAROSCOPIC ABDOMINAL EXPLORATION N/A 01/06/2013   Procedure: LAPAROSCOPIC ABDOMINAL EXPLORATION;  Surgeon: Adin Hector, MD;  Location: [facility];  Service: General;  Laterality: N/A;   LAPAROSCOPIC LYSIS OF ADHESIONS N/A 01/06/2013   Procedure: LAPAROSCOPIC LYSIS OF ADHESIONS/ INTEROTOMY REPAIR;  Surgeon: Adin Hector, MD;  Location: [facility];  Service: General;  Laterality: N/A;   LAPAROTOMY N/A 11/27/2012   Procedure: EXPLORATORY LAPAROTOMY SIGMOID COLECTOMY, COLOSTOMY;  Surgeon: Madilyn Hook, DO;  Location: [facility];  Service: General;  Laterality: N/A;   RADIOLOGY WITH ANESTHESIA Left 10/23/2017   Procedure: CT RENAL CRYO AND BIOPSY;  Surgeon: Aletta Edouard, MD;  Location: [facility];  Service: Radiology;  Laterality: Left;   TOTAL KNEE ARTHROPLASTY Bilateral 11/2006     A IV Location/Drains/Wounds Patient  Lines/Drains/Airways Status     Active Line/Drains/Airways     Name Placement date Placement time Site Days   Peripheral IV 09/26/22 20 G 1" Right Antecubital 09/26/22  1639  Antecubital  less than 1   NG/OG Vented/Dual Lumen 14 Fr. Left nare External length of tube 57 cm 09/26/22  2015  Left nare  less than 1   Colostomy Other (comment) 11/27/12  1408  Other (comment)  [id number]   Colostomy LLQ --  --  LLQ  --            Intake/Output Last 24 hours No intake or output data in the 24 hours ending 09/26/22 2030  Labs/Imaging Results for orders placed or performed during the hospital encounter of 09/26/22 (from the past 48 hour(s))  Lipase, blood     Status: None   Collection Time: 09/26/22  4:37 PM  Result Value Ref Range   Lipase 33 11 - 51 U/L    Comment: Performed at [facility], [place] [address] [place], [place] [address]  Comprehensive metabolic panel     Status: Abnormal   Collection Time: 09/26/22  4:37 PM  Result Value Ref Range   Sodium 138 135 - 145 mmol/L   Potassium 3.7 3.5 - 5.1 mmol/L   Chloride 97 (L) 98 - 111 mmol/L   CO2 29 22 - 32 mmol/L   Glucose, Bld 143 (H) 70 - 99 mg/dL    Comment: Glucose reference range applies only to samples taken after fasting for at least 8 hours.   BUN 24 (H) 8 - 23 mg/dL   Creatinine, Ser 0.96 0.44 - 1.00 mg/dL   Calcium 10.2 8.9 - 10.3 mg/dL   Total Protein 8.5 (H) 6.5 - 8.1 g/dL   Albumin 4.5 3.5 - 5.0 g/dL   AST 29 15 - 41 U/L   ALT 18 0 - 44 U/L   Alkaline Phosphatase 89 38 - 126 U/L   Total Bilirubin 1.1 0.3 - 1.2 mg/dL   GFR, Estimated >60 >60 mL/min    Comment: (NOTE) Calculated using the CKD-EPI Creatinine Equation (2021)    Anion gap 12 5 - 15    Comment: Performed at [facility], [place] [address], [place], [place] [address]  CBC     Status: None   Collection Time: 09/26/22  4:37 PM  Result Value Ref Range   WBC 8.4 4.0 - 10.5 K/uL   RBC 4.21 3.87 - 5.11 MIL/uL   Hemoglobin  12.9 12.0 - 15.0 g/dL   HCT 40.7 36.0 - 46.0 %   MCV 96.7 80.0 - 100.0 fL   MCH 30.6 26.0 - 34.0 pg   MCHC 31.7 30.0 - 36.0 g/dL   RDW 13.7 11.5 - 15.5 %   Platelets 330 150 - 400 K/uL   nRBC 0.0 0.0 - 0.2 %  Comment: Performed at [facility], [place] [address], [place], [place] [address]   CT ABDOMEN PELVIS W CONTRAST  Result Date: 09/26/2022 CLINICAL DATA:  Colostomy vomiting nausea EXAM: CT ABDOMEN AND PELVIS WITH CONTRAST TECHNIQUE: Multidetector CT imaging of the abdomen and pelvis was performed using the standard protocol following bolus administration of intravenous contrast. RADIATION DOSE REDUCTION: This exam was performed according to the departmental dose-optimization program which includes automated exposure control, adjustment of the mA and/or kV according to patient size and/or use of iterative reconstruction technique. Technical malfunction resulted in only delayed phase imaging. CONTRAST:  67mL OMNIPAQUE IOHEXOL 300 MG/ML  SOLN COMPARISON:  12/18/2021, 12/08/2021, 01/16/2021, 07/04/2020 and previous exams dating back to 2016. FINDINGS: Lower chest: Lung bases demonstrate no acute airspace disease. Coronary vascular calcification. Hepatobiliary: No calcified gallstones. No biliary dilatation. Hepatic cysts. Pancreas: No inflammation. Stable dilatation of the proximal duct measuring up to 7 mm. Spleen: Normal in size without focal abnormality. Adrenals/Urinary Tract: Adrenal glands are within normal limits. Limited assessment of kidneys due to delayed phase imaging. Post treatment changes at the posterior cortex of left kidney without significant change. No hydronephrosis. The bladder is unremarkable aside from small posterior bladder diverticula. Stomach/Bowel: Fluid distension of the distal stomach, duodenum and jejunum. Fluid-filled distended proximal small bowel measuring up to 4.4 cm. Transition to decompressed small bowel in the left lower quadrant, series 2, image  51, similar location as compared with previous exams. The small bowel distal to the transition point is completely decompressed. Negative appendix. Left abdominal colostomy. Diverticular disease of the left colon. Vascular/Lymphatic: Advanced aortic atherosclerosis. No aneurysm. No suspicious lymph nodes. Reproductive: Uterus unremarkable.  No adnexal mass. Other: Negative for free air or free fluid. Wide neck ventral hernia containing colon and small bowel as well as mesenteric fat, this does not appear to be the source of obstruction. Musculoskeletal: Severe scoliosis and multilevel degenerative changes. Advanced bilateral hip arthritis. Chronic inferior pubic ramus fracture on the right. IMPRESSION: 1. Findings consistent with high-grade small bowel obstruction with transition point in the left lower quadrant, similar location as compared with previous exams and potentially due to adhesive disease. Mid to distal small bowel is completely decompressed beyond the transition point. 2. Left abdominal colostomy. Diverticular disease of the colon without acute inflammatory process. 3. Wide neck ventral hernia containing colon, small bowel and mesenteric fat but without signs for incarceration or obstruction related to this finding. 4. Stable post treatment changes of the left kidney. 5. Aortic atherosclerosis. Aortic Atherosclerosis (ICD10-I70.0). Electronically Signed   By: Donavan Foil M.D.   On: 09/26/2022 19:15   DG Abdomen 1 View  Result Date: 09/26/2022 CLINICAL DATA:  Nausea vomiting EXAM: ABDOMEN - 1 VIEW COMPARISON:  07/07/2020, CT 07/10/2021 FINDINGS: Right-side-up decubitus view obtained. No gross free air. Air distended bowel with fluid levels raising concern for bowel obstruction. IMPRESSION: Air distended bowel with fluid levels raising concern for bowel obstruction. Electronically Signed   By: Donavan Foil M.D.   On: 09/26/2022 18:00    Pending Labs Unresulted Labs (From admission, onward)      Start     Ordered   10/03/22 0500  Creatinine, serum  (enoxaparin (LOVENOX)    CrCl >/= 30 ml/min)  Weekly,   R     Comments: while on enoxaparin therapy    09/26/22 1926   09/27/22 0500  CBC  Tomorrow morning,   R        09/26/22 1927   09/27/22 0500  Basic metabolic panel  Tomorrow morning,   R        09/26/22 1927   09/27/22 0500  Magnesium  Tomorrow morning,   R        09/26/22 1929   09/27/22 0500  Phosphorus  Tomorrow morning,   R        09/26/22 1929   09/26/22 1926  HIV Antibody (routine testing w rflx)  (HIV Antibody (Routine testing w reflex) panel)  Once,   R        09/26/22 1926   09/26/22 1630  Urinalysis, Routine w reflex microscopic -Urine, Clean Catch  Once,   URGENT       Question:  Specimen Source  Answer:  Urine, Clean Catch   09/26/22 1630            Vitals/Pain Today's Vitals   09/26/22 1800 09/26/22 1815 09/26/22 1825 09/26/22 1900  BP:   131/85   Pulse: (!) 103 (!) 101 (!) 104 (!) 110  Resp: 17 13 (!) 21 14  Temp:      TempSrc:      SpO2: 97% 96% 97% 98%  Weight:      Height:      PainSc:        Isolation Precautions No active isolations  Medications Medications  lidocaine (XYLOCAINE) 2 % viscous mouth solution 15 mL (15 mLs Mouth/Throat Not Given 09/26/22 2026)  enoxaparin (LOVENOX) injection 40 mg (has no administration in time range)  lactated ringers infusion (has no administration in time range)  insulin aspart (novoLOG) injection 0-9 Units (has no administration in time range)  lactated ringers bolus 1,000 mL (has no administration in time range)  methocarbamol (ROBAXIN) 1,000 mg in dextrose 5 % 100 mL IVPB (has no administration in time range)  acetaminophen (TYLENOL) suppository 650 mg (has no administration in time range)  ondansetron (ZOFRAN) injection 4 mg (has no administration in time range)    Or  ondansetron (ZOFRAN) 8 mg/NS 50 ml IVPB (has no administration in time range)  prochlorperazine (COMPAZINE) injection 5-10 mg (10 mg  Intravenous Given 09/26/22 1959)  lip balm (CARMEX) ointment (has no administration in time range)  phenol (CHLORASEPTIC) mouth spray 2 spray (has no administration in time range)  menthol-cetylpyridinium (CEPACOL) lozenge 3 mg (has no administration in time range)  magic mouthwash (has no administration in time range)  alum & mag hydroxide-simeth (MAALOX/MYLANTA) 200-200-20 MG/5ML suspension 30 mL (has no administration in time range)  simethicone (MYLICON) 40 [id number] suspension 80 mg (has no administration in time range)  metoprolol tartrate (LOPRESSOR) injection 5 mg (has no administration in time range)  ketorolac (TORADOL) 15 MG/ML injection 15 mg (has no administration in time range)  diatrizoate meglumine-sodium (GASTROGRAFIN) 66-10 % solution 90 mL (has no administration in time range)  pantoprazole (PROTONIX) injection 40 mg (has no administration in time range)  HYDROmorphone (DILAUDID) injection 0.5-1 mg (has no administration in time range)  morphine (PF) 2 MG/ML injection 2 mg (2 mg Intravenous Given 09/26/22 1721)  ondansetron (ZOFRAN) injection 4 mg (4 mg Intravenous Given 09/26/22 1721)  iohexol (OMNIPAQUE) 300 MG/ML solution 80 mL (80 mLs Intravenous Contrast Given 09/26/22 1831)  lactated ringers bolus 1,000 mL (1,000 mLs Intravenous New Bag/Given 09/26/22 2022)    Mobility non-ambulatory        R Recommendations: See Admitting Provider Note  Report given to:   Additional Notes:  Pt has NG tube. Pt here for SBO and has a hx of the same.  Pt is non-ambulatory. Pt a&ox4.

## 2022-09-26 NOTE — Consult Note (Signed)
[name]  1955/09/05 [id number]  CARE TEAM:  PCP: [name], MD  Outpatient Care Team: Patient Care Team: [name], MD as PCP - General Tamala Julian Lynnell Dike, MD (Inactive) as PCP - Cardiology (Cardiology) Gatha Mayer, MD as Consulting Physician (Gastroenterology) Gavin Pound, MD as Consulting Physician (Rheumatology) Viona Gilmore, [facility] (Inactive) as Pharmacist (Pharmacist) Salvadore Dom, MD as Consulting Physician (Obstetrics and Gynecology)  Inpatient Treatment Team: Treatment Team: Attending Provider: Kayleen Memos, DO; Registered Nurse: Cephus Slater, RN; Technician: Freddy Jaksch, EMT; Rounding Team: Jackelyn Knife, MD   This patient is a 67 y.o.female who presents today for surgical evaluation at the request of [name].   Chief complaint / Reason for evaluation: recurrent SBO  67 year old woman known to our service.  Had perforated diverticulitis requiring emergency Hartmann  sigmoid colectomy / colostomy 2014.   Pathology consistent with diverticulitis.  She developed a bowel obstruction requiring laparoscopic lysis lesions and enterotomy repair with myself 6 weeks later.  No surgery since.  She did develop a very large incisional hernia from the emergency surgery.  Loss of domain.  However nonobstructing.  Given her multiple medical problems of Marfan syndrome and immunosuppressed for her rheumatoid arthritis, it was felt she was not a good candidate for colostomy takedown and incisional hernia repair.  Got a second opinion I believe down in [place] with Dr. Roxanne Mins as well.  She has come in intermittently with bowel obstructions.  Usually admitted with nasogastric tube decompression and resolves quickly.  She has mobility issues.  History of congestive heart failure, hypertension, blindness, pulmonary hypertension.  Chronic constipation.  GERD.  Plan:  Medical admission and stabilization.  Nasogastric tube  decompression.  Small bowel protocol.  IV fluid resuscitation.  Try to avoid any repeat operations given her rather hostile abdomen with giant incisional hernia.  Usually things resolve relatively quickly but surgery will help follow     Assessment  [name]  67 y.o. female       Problem List:  Principal Problem:   Small bowel obstruction due to adhesions Active Problems:   Marfan's syndrome   Rheumatoid arthritis (HCC)   Incisional hernia   ABUSE, ALCOHOL, IN REMISSION   Legally blind   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 11/28/2012   Constipation, chronic   Pulmonary HTN (HCC)   Chronic combined systolic and diastolic heart failure   CHF (congestive heart failure)   Colostomy in place     I reviewed nursing notes, ED provider notes, hospitalist notes, last 24 h vitals and pain scores, last 48 h intake and output, last 24 h labs and trends, last 24 h imaging results, and prior hospitaliations/films . I have reviewed this patient's available data, including medical history, events of note, test results, etc as part of my evaluation.  A significant portion of that time was spent in counseling.  Care during the described time interval was provided by me.  This care required moderate level of medical decision making.  09/26/2022  Adin Hector, MD, FACS, MASCRS Esophageal, Gastrointestinal & Colorectal Surgery Robotic and Minimally Invasive Surgery  [facility] [id number] [place],  [address] [phone] Fax [phone] Main  CONTACT INFORMATION:  Weekday (9AM-5PM): Call [facility] main office at [phone]  Weeknight (5PM-9AM) or Weekend/Holiday: Check [email or web address] (password " [email or web address]") for General Surgery [facility] coverage  (Please, do not use SecureChat  as it is not reliable communication to reach operating surgeons for immediate patient care given  surgeries/outpatient duties/clinic/cross-coverage/off post-call which would lead to a delay in care.  Epic staff messaging available for outptient concerns, but may not be answered for 48 hours or more).     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   bilateral     Past Surgical History:  Procedure Laterality Date   ABLATION  01/04/2014   atrial flutter ablation (2 circuits) by Dr Lovena Le   ATRIAL FLUTTER ABLATION N/A 01/04/2014   Procedure: ATRIAL FLUTTER ABLATION;  Surgeon: Evans Lance, MD;  Location: [facility] CATH LAB;  Service: Cardiovascular;  Laterality: N/A;   BUNIONECTOMY Bilateral 06/2008   COLECTOMY WITH COLOSTOMY CREATION/HARTMANN PROCEDURE  11/27/2012   colectomy   COLOSTOMY  11/27/2012   HERNIA REPAIR     IR RADIOLOGIST EVAL & MGMT  10/01/2017   IR RADIOLOGIST EVAL & MGMT  11/21/2017   IR RADIOLOGIST EVAL & MGMT  02/13/2018   IR RADIOLOGIST EVAL & MGMT  01/08/2019   IR RADIOLOGIST EVAL & MGMT  12/29/2019   IR RADIOLOGIST EVAL & MGMT  01/31/2021   IR RADIOLOGIST EVAL & MGMT  12/20/2021   LAPAROSCOPIC  ABDOMINAL EXPLORATION N/A 01/06/2013   Procedure: LAPAROSCOPIC ABDOMINAL EXPLORATION;  Surgeon: Adin Hector, MD;  Location: [facility] ORS;  Service: General;  Laterality: N/A;   LAPAROSCOPIC LYSIS OF ADHESIONS N/A 01/06/2013   Procedure: LAPAROSCOPIC LYSIS OF ADHESIONS/ INTEROTOMY REPAIR;  Surgeon: Adin Hector, MD;  Location: [facility] ORS;  Service: General;  Laterality: N/A;   LAPAROTOMY N/A 11/27/2012   Procedure: EXPLORATORY LAPAROTOMY SIGMOID COLECTOMY, COLOSTOMY;  Surgeon: Madilyn Hook, DO;  Location: [facility] ORS;  Service: General;  Laterality: N/A;   RADIOLOGY WITH ANESTHESIA Left 10/23/2017   Procedure: CT RENAL CRYO AND BIOPSY;  Surgeon: Aletta Edouard, MD;  Location: [facility] ORS;  Service: Radiology;  Laterality: Left;   TOTAL KNEE ARTHROPLASTY Bilateral 11/2006    Social History   Socioeconomic History   Marital status: Single    Spouse name: Not on file   Number of children: 1   Years of education: Not on file   Highest education level: Not on file  Occupational History   Not on file  Tobacco Use   Smoking status: Former    Packs/day: 1    Types: Cigarettes    Quit date: 11/27/2012    Years since quitting: 9.8  Smokeless tobacco: Never  Vaping Use   Vaping Use: Former  Substance and Sexual Activity   Alcohol use: No    Alcohol/week: 0.0 standard drinks of alcohol    Comment: recovering alcoholic sober since 0000000   Drug use: Yes    Types: Hydrocodone   Sexual activity: Not Currently    Partners: Male    Birth control/protection: None  Other Topics Concern   Not on file  Social History Narrative   Not on file   Social Determinants of Health   Financial Resource Strain: Low Risk  (10/16/2021)   Overall Financial Resource Strain (CARDIA)    Difficulty of Paying Living Expenses: Not hard at all  Food Insecurity: No Food Insecurity (10/16/2021)   Hunger Vital Sign    Worried About Running Out of Food in the Last Year: Never true    Ran Out of Food in the Last Year: Never true   Transportation Needs: No Transportation Needs (10/16/2021)   PRAPARE - Hydrologist (Medical): No    Lack of Transportation (Non-Medical): No  Physical Activity: Inactive (10/16/2021)   Exercise Vital Sign    Days of Exercise per Week: 0 days    Minutes of Exercise per Session: 0 min  Stress: Stress Concern Present (10/16/2021)   [place]    Feeling of Stress : To some extent  Social Connections: Unknown (10/05/2020)   Social Connection and Isolation Panel [NHANES]    Frequency of Communication with Friends and Family: Not on file    Frequency of Social Gatherings with Friends and Family: Once a week    Attends Religious Services: Never    Marine scientist or Organizations: No    Attends Archivist Meetings: Never    Marital Status: Never married  Intimate Partner Violence: Not At Risk (10/05/2020)   Humiliation, Afraid, Rape, and Kick questionnaire    Fear of Current or Ex-Partner: No    Emotionally Abused: No    Physically Abused: No    Sexually Abused: No    Family History  Problem Relation Age of Onset   Heart attack Neg Hx    Colon cancer Neg Hx    Osteoporosis Neg Hx     Current Facility-Administered Medications  Medication Dose Route Frequency Provider Last Rate Last Admin   acetaminophen (TYLENOL) suppository 650 mg  650 mg Rectal Q6H PRN Michael Boston, MD       alum & mag hydroxide-simeth (MAALOX/MYLANTA) 200-200-20 MG/5ML suspension 30 mL  30 mL Oral Q6H PRN Michael Boston, MD       diatrizoate meglumine-sodium (GASTROGRAFIN) 66-10 % solution 90 mL  90 mL Per NG tube Once Hall, Carole N, DO       diatrizoate meglumine-sodium (GASTROGRAFIN) 66-10 % solution 90 mL  90 mL Per NG tube Once Michael Boston, MD       enoxaparin (LOVENOX) injection 40 mg  40 mg Subcutaneous Q24H Hall, Carole N, DO       HYDROmorphone (DILAUDID) injection 0.5 mg  0.5 mg Intravenous Q4H  PRN Irene Pap N, DO       HYDROmorphone (DILAUDID) injection 0.5-2 mg  0.5-2 mg Intravenous Q2H PRN Michael Boston, MD       insulin aspart (novoLOG) injection 0-9 Units  0-9 Units Subcutaneous Q4H Hall, Carole N, DO       ketorolac (TORADOL) 15 MG/ML injection 15 mg  15 mg Intravenous Q8H PRN Hall,  Lorenda Cahill, DO       lactated ringers bolus 1,000 mL  1,000 mL Intravenous Once Michael Boston, MD       lactated ringers bolus 1,000 mL  1,000 mL Intravenous Q8H PRN Lakie Mclouth, Remo Lipps, MD       lactated ringers bolus 500 mL  500 mL Intravenous Once Fransico Meadow, MD       lactated ringers infusion   Intravenous Continuous Irene Pap N, DO       lidocaine (XYLOCAINE) 2 % viscous mouth solution 15 mL  15 mL Mouth/Throat Once Fransico Meadow, MD       lip balm (CARMEX) ointment   Topical BID Michael Boston, MD       magic mouthwash  15 mL Oral QID PRN Michael Boston, MD       menthol-cetylpyridinium (CEPACOL) lozenge 3 mg  1 lozenge Oral PRN Michael Boston, MD       methocarbamol (ROBAXIN) 1,000 mg in dextrose 5 % 100 mL IVPB  1,000 mg Intravenous Q6H PRN Michael Boston, MD       metoprolol tartrate (LOPRESSOR) injection 5 mg  5 mg Intravenous Q6H PRN Michael Boston, MD       ondansetron [facility]) injection 4 mg  4 mg Intravenous Q6H PRN Michael Boston, MD       Or   ondansetron (ZOFRAN) 8 mg in sodium chloride 0.9 % 50 mL IVPB  8 mg Intravenous Q6H PRN Michael Boston, MD       phenol (CHLORASEPTIC) mouth spray 2 spray  2 spray Mouth/Throat PRN Michael Boston, MD       prochlorperazine (COMPAZINE) injection 5-10 mg  5-10 mg Intravenous Q4H PRN Michael Boston, MD       simethicone (MYLICON) 40 [id number] suspension 80 mg  80 mg Oral QID PRN Michael Boston, MD       Current Outpatient Medications  Medication Sig Dispense Refill   Abatacept 125 MG/ML SOSY Inject 125 mg into the skin once a week. Has on Sunday     albuterol (VENTOLIN HFA) 108 (90 Base) MCG/ACT inhaler INHALE 1 PUFF INTO THE LUNGS EVERY 4 HOURS  AS NEEDED FOR WHEEZING OR SHORTNESS OF BREATH 8.5 g 3   allopurinol (ZYLOPRIM) 100 MG tablet TAKE 1 TABLET BY MOUTH EVERY DAY 90 tablet 0   aspirin EC 81 MG tablet Take 1 tablet (81 mg total) by mouth daily.     CALCIUM PO Take 1 tablet by mouth 2 (two) times daily. 600 mg of calcium with vitamin D     cetirizine (ZYRTEC) 10 MG tablet Take 1 tablet (10 mg total) by mouth daily. 90 tablet 1   Cholecalciferol (VITAMIN D) 2000 units CAPS Take 2,000 Units by mouth daily.     ciprofloxacin-dexamethasone (CIPRODEX) OTIC suspension Place 4 drops into both ears 2 (two) times daily. 7.5 mL 5   clobetasol ointment (TEMOVATE) [id number] % Apply 1 Application topically 2 (two) times daily. 60 g 5   diclofenac sodium (VOLTAREN) 1 % GEL Apply 1 application topically 4 (four) times daily as needed (arthritis).     docusate sodium (COLACE) 100 MG capsule Take 100 mg by mouth 2 (two) times daily.     esomeprazole (NEXIUM) 40 MG capsule TAKE 1 CAPSULE(40 MG) BY MOUTH DAILY IN THE AFTERNOON 90 capsule 0   folic acid (FOLVITE) [id number] MCG tablet Take 400 mcg by mouth daily.     [START ON 09/30/2022] HYDROcodone-acetaminophen (NORCO) 10-325 MG tablet Take 1 tablet by mouth  every 6 (six) hours as needed for moderate pain. 120 tablet 0   ketoconazole (NIZORAL) 2 % shampoo Apply 1 application topically 2 (two) times a week.     megestrol (MEGACE ES) 625 MG/5ML suspension SHAKE LIQUID AND TAKE 5 ML BY MOUTH THREE TIMES DAILY BEFORE MEALS AS NEEDED FOR APPETITE 150 mL 5   Methotrexate Sodium (METHOTREXATE, PF,) 50 MG/2ML injection Inject 0.5 mLs into the muscle once a week. Sunday  0   metoprolol succinate (TOPROL-XL) 25 MG 24 hr tablet Take 1 tablet (25 mg total) by mouth daily. 90 tablet 3   Multiple Vitamin (MULTIVITAMIN WITH MINERALS) TABS tablet Take 1 tablet by mouth daily.     mupirocin ointment (BACTROBAN) 2 % Apply 1 Application topically 2 (two) times daily. 30 g 2   ondansetron (ZOFRAN-ODT) 8 MG disintegrating tablet Take 1  tablet (8 mg total) by mouth every 8 (eight) hours as needed for nausea or vomiting. 60 tablet 5   PERIDEX 0.12 % solution Use as directed 15 mLs in the mouth or throat 2 (two) times daily.      polyethylene glycol (MIRALAX / GLYCOLAX) 17 g packet Take 17 g by mouth daily as needed for mild constipation. 14 each 1   predniSONE (DELTASONE) 5 MG tablet Take 5-10 mg by mouth daily as needed.     temazepam (RESTORIL) 30 MG capsule 1 capsule at bedtime as needed     tiZANidine (ZANAFLEX) 4 MG tablet TAKE 1 TABLET(4 MG) BY MOUTH TWICE DAILY 90 tablet 5   traMADol (ULTRAM) 50 MG tablet TAKE 1 TO 2 TABLETS(50 TO 100 MG) BY MOUTH EVERY 6 HOURS AS NEEDED 180 tablet 1   triamcinolone lotion (KENALOG) 0.1 % Apply topically 2 (two) times daily. 60 mL 5   vitamin B-12 (CYANOCOBALAMIN) 500 MCG tablet Take 500 mcg by mouth daily.     vitamin C (ASCORBIC ACID) 500 MG tablet Take 500 mg by mouth daily.      zoledronic acid (RECLAST) 5 MG/100ML SOLN injection Inject 5 mg into the vein once.       Allergies  Allergen Reactions   Spinach Itching, Rash and Other (See Comments)    Welts.     BP 131/85   Pulse (!) 110   Temp 99.5 F (37.5 C) (Oral)   Resp 14   Ht 5\' 8"  (1.727 m)   Wt 68 kg   LMP 06/25/2006 (LMP Unknown)   SpO2 98%   BMI 22.81 kg/m     Results:   Labs: Results for orders placed or performed during the hospital encounter of 09/26/22 (from the past 48 hour(s))  Lipase, blood     Status: None   Collection Time: 09/26/22  4:37 PM  Result Value Ref Range   Lipase 33 11 - 51 U/L    Comment: Performed at [facility], [place] [address] [place], [place] [address]  Comprehensive metabolic panel     Status: Abnormal   Collection Time: 09/26/22  4:37 PM  Result Value Ref Range   Sodium 138 135 - 145 mmol/L   Potassium 3.7 3.5 - 5.1 mmol/L   Chloride 97 (L) 98 - 111 mmol/L   CO2 29 22 - 32 mmol/L   Glucose, Bld 143 (H) 70 - 99 mg/dL    Comment: Glucose reference range  applies only to samples taken after fasting for at least 8 hours.   BUN 24 (H) 8 - 23 mg/dL   Creatinine, Ser 0.96 0.44 -  1.00 mg/dL   Calcium 10.2 8.9 - 10.3 mg/dL   Total Protein 8.5 (H) 6.5 - 8.1 g/dL   Albumin 4.5 3.5 - 5.0 g/dL   AST 29 15 - 41 U/L   ALT 18 0 - 44 U/L   Alkaline Phosphatase 89 38 - 126 U/L   Total Bilirubin 1.1 0.3 - 1.2 mg/dL   GFR, Estimated >60 >60 mL/min    Comment: (NOTE) Calculated using the CKD-EPI Creatinine Equation (2021)    Anion gap 12 5 - 15    Comment: Performed at [facility], [place] [address], [place], [place] [address]  CBC     Status: None   Collection Time: 09/26/22  4:37 PM  Result Value Ref Range   WBC 8.4 4.0 - 10.5 K/uL   RBC 4.21 3.87 - 5.11 MIL/uL   Hemoglobin 12.9 12.0 - 15.0 g/dL   HCT 40.7 36.0 - 46.0 %   MCV 96.7 80.0 - 100.0 fL   MCH 30.6 26.0 - 34.0 pg   MCHC 31.7 30.0 - 36.0 g/dL   RDW 13.7 11.5 - 15.5 %   Platelets 330 150 - 400 K/uL   nRBC 0.0 0.0 - 0.2 %    Comment: Performed at [facility], [place] [address] [place], [place] [address]    Imaging / Studies: CT ABDOMEN PELVIS W CONTRAST  Result Date: 09/26/2022 CLINICAL DATA:  Colostomy vomiting nausea EXAM: CT ABDOMEN AND PELVIS WITH CONTRAST TECHNIQUE: Multidetector CT imaging of the abdomen and pelvis was performed using the standard protocol following bolus administration of intravenous contrast. RADIATION DOSE REDUCTION: This exam was performed according to the departmental dose-optimization program which includes automated exposure control, adjustment of the mA and/or kV according to patient size and/or use of iterative reconstruction technique. Technical malfunction resulted in only delayed phase imaging. CONTRAST:  95mL OMNIPAQUE IOHEXOL 300 MG/ML  SOLN COMPARISON:  12/18/2021, 12/08/2021, 01/16/2021, 07/04/2020 and previous exams dating back to 2016. FINDINGS: Lower chest: Lung bases demonstrate no acute airspace disease. Coronary  vascular calcification. Hepatobiliary: No calcified gallstones. No biliary dilatation. Hepatic cysts. Pancreas: No inflammation. Stable dilatation of the proximal duct measuring up to 7 mm. Spleen: Normal in size without focal abnormality. Adrenals/Urinary Tract: Adrenal glands are within normal limits. Limited assessment of kidneys due to delayed phase imaging. Post treatment changes at the posterior cortex of left kidney without significant change. No hydronephrosis. The bladder is unremarkable aside from small posterior bladder diverticula. Stomach/Bowel: Fluid distension of the distal stomach, duodenum and jejunum. Fluid-filled distended proximal small bowel measuring up to 4.4 cm. Transition to decompressed small bowel in the left lower quadrant, series 2, image 51, similar location as compared with previous exams. The small bowel distal to the transition point is completely decompressed. Negative appendix. Left abdominal colostomy. Diverticular disease of the left colon. Vascular/Lymphatic: Advanced aortic atherosclerosis. No aneurysm. No suspicious lymph nodes. Reproductive: Uterus unremarkable.  No adnexal mass. Other: Negative for free air or free fluid. Wide neck ventral hernia containing colon and small bowel as well as mesenteric fat, this does not appear to be the source of obstruction. Musculoskeletal: Severe scoliosis and multilevel degenerative changes. Advanced bilateral hip arthritis. Chronic inferior pubic ramus fracture on the right. IMPRESSION: 1. Findings consistent with high-grade small bowel obstruction with transition point in the left lower quadrant, similar location as compared with previous exams and potentially due to adhesive disease. Mid to distal small bowel is completely decompressed beyond the transition point. 2. Left abdominal  colostomy. Diverticular disease of the colon without acute inflammatory process. 3. Wide neck ventral hernia containing colon, small bowel and mesenteric  fat but without signs for incarceration or obstruction related to this finding. 4. Stable post treatment changes of the left kidney. 5. Aortic atherosclerosis. Aortic Atherosclerosis (ICD10-I70.0). Electronically Signed   By: Donavan Foil M.D.   On: 09/26/2022 19:15   DG Abdomen 1 View  Result Date: 09/26/2022 CLINICAL DATA:  Nausea vomiting EXAM: ABDOMEN - 1 VIEW COMPARISON:  07/07/2020, CT 07/10/2021 FINDINGS: Right-side-up decubitus view obtained. No Braeley Buskey free air. Air distended bowel with fluid levels raising concern for bowel obstruction. IMPRESSION: Air distended bowel with fluid levels raising concern for bowel obstruction. Electronically Signed   By: Donavan Foil M.D.   On: 09/26/2022 18:00    Medications / Allergies: per chart  Antibiotics: Anti-infectives (From admission, onward)    None         Note: Portions of this report may have been transcribed using voice recognition software. Every effort was made to ensure accuracy; however, inadvertent computerized transcription errors may be present.   Any transcriptional errors that result from this process are unintentional.    Adin Hector, MD, FACS, MASCRS Esophageal, Gastrointestinal & Colorectal Surgery Robotic and Minimally Invasive Surgery  [facility] [address] [place], [place] [address] [phone] Fax [phone] Main  CONTACT INFORMATION:  Weekday (9AM-5PM): Call [facility] main office at [phone]  Weeknight (5PM-9AM) or Weekend/Holiday: Check [email or web address] (password " [email or web address]") for General Surgery [facility] coverage  (Please, do not use SecureChat as it is not reliable communication to reach operating surgeons for immediate patient care given surgeries/outpatient duties/clinic/cross-coverage/off post-call which would lead to a delay in care.  Epic staff messaging available for outptient concerns, but may not be answered for 48 hours or more).       09/26/2022  7:33 PM

## 2022-09-27 ENCOUNTER — Inpatient Hospital Stay (HOSPITAL_COMMUNITY): Payer: 59

## 2022-09-27 DIAGNOSIS — F1011 Alcohol abuse, in remission: Secondary | ICD-10-CM

## 2022-09-27 DIAGNOSIS — K56609 Unspecified intestinal obstruction, unspecified as to partial versus complete obstruction: Secondary | ICD-10-CM

## 2022-09-27 DIAGNOSIS — R1114 Bilious vomiting: Secondary | ICD-10-CM

## 2022-09-27 DIAGNOSIS — K565 Intestinal adhesions [bands], unspecified as to partial versus complete obstruction: Secondary | ICD-10-CM | POA: Diagnosis not present

## 2022-09-27 LAB — URINALYSIS, ROUTINE W REFLEX MICROSCOPIC
Bacteria, UA: NONE SEEN
Bilirubin Urine: NEGATIVE
Glucose, UA: NEGATIVE mg/dL
Hgb urine dipstick: NEGATIVE
Ketones, ur: 20 mg/dL — AB
Leukocytes,Ua: NEGATIVE
Nitrite: NEGATIVE
Protein, ur: 30 mg/dL — AB
Specific Gravity, Urine: >=1.03 (ref 1.005–1.030)
pH: 5 (ref 5.0–8.0)

## 2022-09-27 LAB — BASIC METABOLIC PANEL
Anion gap: 15 (ref 5–15)
BUN: 30 mg/dL — ABNORMAL HIGH (ref 8–23)
CO2: 31 mmol/L (ref 22–32)
Calcium: 10.3 mg/dL (ref 8.9–10.3)
Chloride: 96 mmol/L — ABNORMAL LOW (ref 98–111)
Creatinine, Ser: 1.24 mg/dL — ABNORMAL HIGH (ref 0.44–1.00)
GFR, Estimated: 48 mL/min — ABNORMAL LOW (ref >60)
Glucose, Bld: 145 mg/dL — ABNORMAL HIGH (ref 70–99)
Potassium: 3.5 mmol/L (ref 3.5–5.1)
Sodium: 142 mmol/L (ref 135–145)

## 2022-09-27 LAB — CBC
HCT: 38.5 % (ref 36.0–46.0)
Hemoglobin: 12.3 g/dL (ref 12.0–15.0)
MCH: 31.3 pg (ref 26.0–34.0)
MCHC: 31.9 g/dL (ref 30.0–36.0)
MCV: 98 fL (ref 80.0–100.0)
Platelets: 299 10*3/uL (ref 150–400)
RBC: 3.93 MIL/uL (ref 3.87–5.11)
RDW: 13.9 % (ref 11.5–15.5)
WBC: 5.2 10*3/uL (ref 4.0–10.5)
nRBC: 0 % (ref 0.0–0.2)

## 2022-09-27 LAB — MAGNESIUM: Magnesium: 2.1 mg/dL (ref 1.7–2.4)

## 2022-09-27 LAB — PHOSPHORUS: Phosphorus: 7.5 mg/dL — ABNORMAL HIGH (ref 2.5–4.6)

## 2022-09-27 LAB — GLUCOSE, CAPILLARY
Glucose-Capillary: 136 mg/dL — ABNORMAL HIGH (ref 70–99)
Glucose-Capillary: 113 mg/dL — ABNORMAL HIGH (ref 70–99)
Glucose-Capillary: 84 mg/dL (ref 70–99)
Glucose-Capillary: 77 mg/dL (ref 70–99)
Glucose-Capillary: 90 mg/dL (ref 70–99)
Glucose-Capillary: 101 mg/dL — ABNORMAL HIGH (ref 70–99)

## 2022-09-27 LAB — HIV ANTIBODY (ROUTINE TESTING W REFLEX): HIV Screen 4th Generation wRfx: NONREACTIVE

## 2022-09-27 MED ORDER — POTASSIUM CHLORIDE IN NACL 40-0.9 MEQ/L-% IV SOLN
INTRAVENOUS | Status: AC
  Filled 2022-09-27 (×3): qty 1000

## 2022-09-27 NOTE — Progress Notes (Addendum)
[facility] Progress Note     Subjective: CC:  Denies abdominal pain. Denies flatus or BM via ostomy. Per RN patient had an episode of emesis around NG tube overnight.  Objective: Vital signs in last 24 hours: Temp:  [97.8 F (36.6 C)-99.5 F (37.5 C)] 97.8 F (36.6 C) (04/04 0342) Pulse Rate:  [85-110] 87 (04/04 0342) Resp:  [12-24] 16 (04/04 0342) BP: (104-131)/(73-85) 118/81 (04/04 0342) SpO2:  [94 %-98 %] 98 % (04/04 0342) Weight:  [68 kg] 68 kg (04/03 1628)    Intake/Output from previous day: 04/03 0701 - 04/04 0700 In: 493.1 [I.V.:493.1] Out: 1550 [Urine:500; Emesis/NG output:1000; Stool:50] Intake/Output this shift: No intake/output data recorded.  PE: Gen:  Alert, NAD, pleasant Card:  Regular rate and rhythm, pedal pulses 2+ BL Pulm:  Normal effort, clear to auscultation bilaterally Abd: Soft, mild distention, ventral hernia is soft and temporarily reduces, it is mildly tender without rebound or guarding.  NGT in place with light brown effluent - ~450 mL in cannister Skin: warm and dry, no rashes  Psych: A&Ox3   Lab Results:  Recent Labs    09/26/22 1637 09/27/22 0452  WBC 8.4 5.2  HGB 12.9 12.3  HCT 40.7 38.5  PLT 330 299   BMET Recent Labs    09/26/22 1637 09/27/22 0452  NA 138 142  K 3.7 3.5  CL 97* 96*  CO2 29 31  GLUCOSE 143* 145*  BUN 24* 30*  CREATININE 0.96 1.24*  CALCIUM 10.2 10.3   PT/INR No results for input(s): "LABPROT", "INR" in the last 72 hours. CMP     Component Value Date/Time   NA 142 09/27/2022 0452   NA 143 09/10/2016 0000   K 3.5 09/27/2022 0452   CL 96 (L) 09/27/2022 0452   CO2 31 09/27/2022 0452   GLUCOSE 145 (H) 09/27/2022 0452   BUN 30 (H) 09/27/2022 0452   BUN 14 09/10/2016 0000   CREATININE 1.24 (H) 09/27/2022 0452   CREATININE 0.76 11/19/2017 1024   CALCIUM 10.3 09/27/2022 0452   PROT 8.5 (H) 09/26/2022 1637   ALBUMIN 4.5 09/26/2022 1637   AST 29 09/26/2022 1637   ALT 18 09/26/2022 1637    ALKPHOS 89 09/26/2022 1637   BILITOT 1.1 09/26/2022 1637   GFRNONAA 48 (L) 09/27/2022 0452   GFRNONAA 85 11/19/2017 1024   GFRAA 98 11/19/2017 1024   Lipase     Component Value Date/Time   LIPASE 33 09/26/2022 1637       Studies/Results: DG Abd Portable 1V-Small Bowel Obstruction Protocol-initial, 8 hr delay  Result Date: 09/27/2022 CLINICAL DATA:  8 hour delay film EXAM: PORTABLE ABDOMEN - 1 VIEW COMPARISON:  Radiographs 09/26/2022.  CT 09/26/2022 and 12/18/2021. FINDINGS: 0754 hours. Two supine views of the abdomen are submitted. There is a small amount of diluted contrast throughout multiple dilated loops of small bowel. There is probably a small amount of contrast within the decompressed cecum. No extravasated contrast identified. Contrast material is also present in the bladder from the recent CT. Enteric tube projects over the mid stomach. Severe convex left thoracolumbar scoliosis, multilevel spondylosis and right greater than left hip arthropathy are noted. IMPRESSION: Small amount of diluted contrast throughout multiple dilated loops of small bowel consistent with high-grade partial small bowel obstruction. Electronically Signed   By: Richardean Sale M.D.   On: 09/27/2022 08:10   DG Abd Portable 1V-Small Bowel Protocol-Position Verification  Result Date: 09/26/2022 CLINICAL DATA:  Confirm NG tube placement EXAM: PORTABLE  ABDOMEN - 1 VIEW COMPARISON:  Radiographs earlier today at 5:45 p.m. FINDINGS: Enteric tube tip in the stomach with and side port near the GE junction. Scoliosis. The lungs are clear. IMPRESSION: Enteric tube tip in the stomach with and side port at the GE junction. Electronically Signed   By: Placido Sou M.D.   On: 09/26/2022 20:31   CT ABDOMEN PELVIS W CONTRAST  Result Date: 09/26/2022 CLINICAL DATA:  Colostomy vomiting nausea EXAM: CT ABDOMEN AND PELVIS WITH CONTRAST TECHNIQUE: Multidetector CT imaging of the abdomen and pelvis was performed using the  standard protocol following bolus administration of intravenous contrast. RADIATION DOSE REDUCTION: This exam was performed according to the departmental dose-optimization program which includes automated exposure control, adjustment of the mA and/or kV according to patient size and/or use of iterative reconstruction technique. Technical malfunction resulted in only delayed phase imaging. CONTRAST:  68mL OMNIPAQUE IOHEXOL 300 MG/ML  SOLN COMPARISON:  12/18/2021, 12/08/2021, 01/16/2021, 07/04/2020 and previous exams dating back to 2016. FINDINGS: Lower chest: Lung bases demonstrate no acute airspace disease. Coronary vascular calcification. Hepatobiliary: No calcified gallstones. No biliary dilatation. Hepatic cysts. Pancreas: No inflammation. Stable dilatation of the proximal duct measuring up to 7 mm. Spleen: Normal in size without focal abnormality. Adrenals/Urinary Tract: Adrenal glands are within normal limits. Limited assessment of kidneys due to delayed phase imaging. Post treatment changes at the posterior cortex of left kidney without significant change. No hydronephrosis. The bladder is unremarkable aside from small posterior bladder diverticula. Stomach/Bowel: Fluid distension of the distal stomach, duodenum and jejunum. Fluid-filled distended proximal small bowel measuring up to 4.4 cm. Transition to decompressed small bowel in the left lower quadrant, series 2, image 51, similar location as compared with previous exams. The small bowel distal to the transition point is completely decompressed. Negative appendix. Left abdominal colostomy. Diverticular disease of the left colon. Vascular/Lymphatic: Advanced aortic atherosclerosis. No aneurysm. No suspicious lymph nodes. Reproductive: Uterus unremarkable.  No adnexal mass. Other: Negative for free air or free fluid. Wide neck ventral hernia containing colon and small bowel as well as mesenteric fat, this does not appear to be the source of obstruction.  Musculoskeletal: Severe scoliosis and multilevel degenerative changes. Advanced bilateral hip arthritis. Chronic inferior pubic ramus fracture on the right. IMPRESSION: 1. Findings consistent with high-grade small bowel obstruction with transition point in the left lower quadrant, similar location as compared with previous exams and potentially due to adhesive disease. Mid to distal small bowel is completely decompressed beyond the transition point. 2. Left abdominal colostomy. Diverticular disease of the colon without acute inflammatory process. 3. Wide neck ventral hernia containing colon, small bowel and mesenteric fat but without signs for incarceration or obstruction related to this finding. 4. Stable post treatment changes of the left kidney. 5. Aortic atherosclerosis. Aortic Atherosclerosis (ICD10-I70.0). Electronically Signed   By: Donavan Foil M.D.   On: 09/26/2022 19:15   DG Abdomen 1 View  Result Date: 09/26/2022 CLINICAL DATA:  Nausea vomiting EXAM: ABDOMEN - 1 VIEW COMPARISON:  07/07/2020, CT 07/10/2021 FINDINGS: Right-side-up decubitus view obtained. No gross free air. Air distended bowel with fluid levels raising concern for bowel obstruction. IMPRESSION: Air distended bowel with fluid levels raising concern for bowel obstruction. Electronically Signed   By: Donavan Foil M.D.   On: 09/26/2022 18:00    Anti-infectives: Anti-infectives (From admission, onward)    None        Assessment/Plan 67 y/o F with history of marfan's syndrome, RA on immunosuppression, and multiple abdominal  surgeries with a chronic incisional hernia/loss of domain who was admitted with SBO.  - NGT in place, SBO protocol already started and Gastografin given overnight.  - per RN she vomited around NGT early this AM, NGT side port is at the [place] jxn, will have RN advance 8 cm. Follow up KUB this morning shows contrast in the small bowel, repeat KUB in AM. Continue NGT to LIWS   - no emergent surgical needs,  hopefully she will improve with non-operative measures. Failure to improve may warrant surgery which would be exploratory laparotomy lysis of adhesions, possible bowel resection. She would be at increased risk for poor wound healing, dehiscence, anastomotic leak, prolonged hospital stay, and need for SNF after surgery due to her age, comorbidities, and immunosuppression (on weekly abatacept and PRN predisone at home).    LOS: 1 day   I reviewed nursing notes, hospitalist notes, last 24 h vitals and pain scores, last 48 h intake and output, last 24 h labs and trends, and last 24 h imaging results.  This care required moderate level of medical decision making.   Obie Dredge, PA-C [facility] Please see Amion for pager number during day hours 7:00am-4:30pm

## 2022-09-27 NOTE — TOC Initial Note (Signed)
Transition of Care [facility]) - Initial/Assessment Note    Patient Details  Name: [name] MRN: [id number] Date of Birth: 07/24/1955  [facility] [address] CM/SW Contact:    Lennart Pall, LCSW Phone Number: 09/27/2022, 3:24 PM  Clinical Narrative:       Met with pt today to introduce TOC/ CSW role and review for possible dc needs.  Pt reports that she lives with her daughter who does work.  She has a PCS aide daily for ~4hrs per day and daughter "in and out".  Pt estimates she is alone in the home ~ 6hrs per day.  She reports that her daughter assists her with colostomy care.  Pt hopeful to avoid any surgery and aware TOC will follow for any needs.              Expected Discharge Plan:  (TBD) Barriers to Discharge: Continued Medical Work up   Patient Goals and CMS Choice Patient states their goals for this hospitalization and ongoing recovery are:: return home          Expected Discharge Plan and Services In-house Referral: Clinical Social Work     Living arrangements for the past 2 months: Apartment                                      Prior Living Arrangements/Services Living arrangements for the past 2 months: Apartment Lives with:: Adult Children Patient language and need for interpreter reviewed:: Yes Do you feel safe going back to the place where you live?: Yes      Need for Family Participation in Patient Care: Yes (Comment) Care giver support system in place?: Yes (comment) Current home services: Homehealth aide Criminal Activity/Legal Involvement Pertinent to Current Situation/Hospitalization: No - Comment as needed  Activities of Daily Living Home Assistive Devices/Equipment: Dentures (specify type), Eyeglasses, Hearing aid, Wheelchair, Environmental consultant (specify type) ADL Screening (condition at time of admission) Patient's cognitive ability adequate to safely complete daily activities?: Yes Is the patient deaf or have difficulty hearing?: Yes Does the  patient have difficulty seeing, even when wearing glasses/contacts?: Yes Does the patient have difficulty concentrating, remembering, or making decisions?: No Patient able to express need for assistance with ADLs?: Yes Does the patient have difficulty dressing or bathing?: Yes Independently performs ADLs?: No Communication: Independent Dressing (OT): Needs assistance Is this a change from baseline?: Pre-admission baseline Grooming: Needs assistance Is this a change from baseline?: Pre-admission baseline Feeding: Needs assistance Is this a change from baseline?: Pre-admission baseline Bathing: Needs assistance Is this a change from baseline?: Pre-admission baseline Toileting: Needs assistance Is this a change from baseline?: Pre-admission baseline In/Out Bed: Needs assistance Is this a change from baseline?: Pre-admission baseline Walks in Home: Needs assistance Is this a change from baseline?: Pre-admission baseline Does the patient have difficulty walking or climbing stairs?: Yes Weakness of Legs: Both Weakness of Arms/Hands: Both  Permission Sought/Granted Permission sought to share information with : Family Supports Permission granted to share information with : Yes, Verbal Permission Granted  Share Information with NAME: daughter, Tyronda Avitabile @ [phone]           Emotional Assessment Appearance:: Appears stated age Attitude/Demeanor/Rapport: Gracious Affect (typically observed): Accepting Orientation: : Oriented to Self, Oriented to Place, Oriented to  Time, Oriented to Situation Alcohol / Substance Use: Not Applicable Psych Involvement: No (comment)  Admission diagnosis:  Small bowel obstruction [K56.609] SBO (  small bowel obstruction) [K56.609] History of colectomy [Z90.49] Bilious vomiting with nausea [R11.14] Patient Active Problem List   Diagnosis Date Noted   CHF (congestive heart failure) 09/26/2022   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/2022   History of colonic  diverticulitis 09/26/2022   Vitamin D deficiency 12/05/2021   Chronic combined systolic and diastolic heart failure [id number]   Malnutrition of moderate degree 05/11/2020   Small bowel obstruction due to adhesions 05/07/2020   Left renal mass 10/23/2017   Hx of adenomatous polyp of colon 11/13/2016   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   Renal mass    Pulmonary HTN ([place]) 01/01/2014   AKI (acute kidney injury) 01/01/2014   Iron deficiency anemia 01/01/2014   Atrial flutter 12/31/2013   Stoma dermatitis 11/30/2013   Incisional hernia 11/30/2013   Constipation, chronic 11/30/2013   Charcot's joint of foot 07/07/2013   Bilateral leg edema 04/20/2013   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 11/28/2012 01/05/2013   Rheumatoid arthritis ([place]) 06/07/2009   THYROID NODULE 07/01/2007   LUNG NODULE 07/01/2007   Thyroid nodule 07/01/2007   Osteoporosis 05/07/2007   ABUSE, ALCOHOL, 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/12/2007   Legally blind 04/12/2007   HEMORRHOIDS, INTERNAL 04/12/2007   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/14/2007   Hyperlipidemia 01/07/2007   Essential hypertension 01/07/2007   GERD 01/07/2007   Marfan's syndrome 01/07/2007   Stroke 07/2006   Chronic gout 2008   PCP:  [name], MD Pharmacy:   [facility] [place], [place] - [address] AT [place] [place] [address] Phone: [phone] Fax: [place], [place] - [address] AT [place] [address] [place] [address] Phone: [phone] Fax: [phone]     Social Determinants of Health (SDOH) Social History: SDOH Screenings   Food Insecurity: No Food Insecurity (09/26/2022)  Housing: Low Risk  (09/26/2022)  Transportation Needs: No Transportation Needs (09/26/2022)  Utilities: Not At Risk (09/26/2022)  Depression (PHQ2-9): Low Risk  (01/01/2022)  Financial Resource Strain: Low Risk  (10/16/2021)  Physical Activity:  Inactive (10/16/2021)  Social Connections: Unknown (10/05/2020)  Stress: Stress Concern Present (10/16/2021)  Tobacco Use: Medium Risk (09/26/2022)   SDOH Interventions:     Readmission Risk Interventions     No data to display

## 2022-09-27 NOTE — Progress Notes (Signed)
Tube advanced 8cm per PA, patient tolerated well.

## 2022-09-27 NOTE — Progress Notes (Signed)
Per order, foley catheter placed. Patient had in and out this AM and had not urinated, order to place foley if two occurrences. 16 fr placed. Patient tolerated well.

## 2022-09-27 NOTE — Progress Notes (Signed)
[facility] PROGRESS NOTE    Progress Note  KAWANNA BENZING  [id number] DOB: 1956/01/21 DOA: 09/26/2022 PCP: [name], MD     Brief Narrative:   IRHAA MAGNIN is an 67 y.o. female past medical history significant for blindness, diverticulitis with perforation status post colectomy and colostomy give 3 small bowel obstruction hernia repair rheumatoid arthritis on methotrexate, history of CVA comes into the [facility] long ED complaining of nausea vomiting and abdominal pain that started about the day prior to admission.  She was found tachycardic at 116 abdominal x-ray was done that raise concern for bowel obstruction CT scan of the abdomen and pelvis was consistent with high-grade small bowel obstruction with a transition point in the left lower quadrant.  General surgery has been consulted NG tube was placed   Assessment/Plan:   Small bowel obstruction due to adhesions NG tube placed to intermittent suction. Show started on the small bowel protocol General surgery was consulted Continue IV fluids for fluid resuscitation, try to keep potassium greater than 4 magnesium greater than 2. Ambulate and minimize narcotics. Not passing gas minimal output through the NG. This morning is pending.  New acute kidney injury: Likely prerenal azotemia started on aggressive IV fluid hydration recheck basic metabolic panel tomorrow morning.  Prolonged QTc: With a twelve-lead EKG that showed QTc of 560. Try to keep potassium greater than 4 magnesium greater than 2. Her potassium this morning is 3.5 started on IV potassium supplementation recheck basic metabolic panel in the morning.  Essential hypertension: Antihypertensive medications were held on admission. Blood pressures remain relatively stable continue IV metoprolol IV as needed. She has mild tachycardia likely due to pain.  GERD: Continue Protonix.  Rheumatoid arthritis: She received her methotrexate injection prior to  admission. Will be vigilant about infection. She has remained afebrile, with no leukocytosis.  Hyperglycemia with no history of diabetes mellitus, Her last A1c was 5.5 continue sliding scale insulin blood glucose is elevated likely be reactive.  Legally blind Noted.  DVT prophylaxis: lovenox Family Communication:none Status is: Inpatient Remains inpatient appropriate because: Small bowel obstruction    Code Status:     Code Status Orders  (From admission, onward)           Start     Ordered   09/26/22 1926  Full code  Continuous       Question:  By:  Answer:  Consent: discussion documented in EHR   09/26/22 1926           Code Status History     Date Active Date Inactive Code Status Order ID Comments User Context   07/04/2020 2258 07/09/2020 1428 Full Code [id number]  Lenore Cordia, MD ED   05/07/2020 1347 05/13/2020 0121 Full Code [id number]  Harold Hedge, MD ED   08/07/2016 2133 08/08/2016 2252 Full Code [id number]  Edwin Dada, MD Inpatient   06/07/2015 0152 06/14/2015 2156 Full Code [id number]  [name], MD Inpatient   11/22/2014 1155 11/27/2014 2251 Full Code [id number]  Elgergawy, Silver Huguenin, MD Inpatient   05/01/2014 1619 05/11/2014 2159 Full Code [id number]  Oswald Hillock, MD Inpatient   01/04/2014 1849 01/07/2014 1450 Full Code [id number]  Evans Lance, MD Inpatient   12/31/2013 1835 01/04/2014 1849 Full Code [id number]  Sheila Oats, MD Inpatient   02/26/2013 0257 02/28/2013 2254 Full Code [id number]  Excell Seltzer, MD Inpatient         IV Access:   Peripheral IV  Procedures and diagnostic studies:   DG Abd Portable 1V-Small Bowel Protocol-Position Verification  Result Date: 09/26/2022 CLINICAL DATA:  Confirm NG tube placement EXAM: PORTABLE ABDOMEN - 1 VIEW COMPARISON:  Radiographs earlier today at 5:45 p.m. FINDINGS: Enteric tube tip in the stomach with and side port near the GE junction. Scoliosis. The lungs are clear.  IMPRESSION: Enteric tube tip in the stomach with and side port at the GE junction. Electronically Signed   By: Placido Sou M.D.   On: 09/26/2022 20:31   CT ABDOMEN PELVIS W CONTRAST  Result Date: 09/26/2022 CLINICAL DATA:  Colostomy vomiting nausea EXAM: CT ABDOMEN AND PELVIS WITH CONTRAST TECHNIQUE: Multidetector CT imaging of the abdomen and pelvis was performed using the standard protocol following bolus administration of intravenous contrast. RADIATION DOSE REDUCTION: This exam was performed according to the departmental dose-optimization program which includes automated exposure control, adjustment of the mA and/or kV according to patient size and/or use of iterative reconstruction technique. Technical malfunction resulted in only delayed phase imaging. CONTRAST:  31mL OMNIPAQUE IOHEXOL 300 MG/ML  SOLN COMPARISON:  12/18/2021, 12/08/2021, 01/16/2021, 07/04/2020 and previous exams dating back to 2016. FINDINGS: Lower chest: Lung bases demonstrate no acute airspace disease. Coronary vascular calcification. Hepatobiliary: No calcified gallstones. No biliary dilatation. Hepatic cysts. Pancreas: No inflammation. Stable dilatation of the proximal duct measuring up to 7 mm. Spleen: Normal in size without focal abnormality. Adrenals/Urinary Tract: Adrenal glands are within normal limits. Limited assessment of kidneys due to delayed phase imaging. Post treatment changes at the posterior cortex of left kidney without significant change. No hydronephrosis. The bladder is unremarkable aside from small posterior bladder diverticula. Stomach/Bowel: Fluid distension of the distal stomach, duodenum and jejunum. Fluid-filled distended proximal small bowel measuring up to 4.4 cm. Transition to decompressed small bowel in the left lower quadrant, series 2, image 51, similar location as compared with previous exams. The small bowel distal to the transition point is completely decompressed. Negative appendix. Left abdominal  colostomy. Diverticular disease of the left colon. Vascular/Lymphatic: Advanced aortic atherosclerosis. No aneurysm. No suspicious lymph nodes. Reproductive: Uterus unremarkable.  No adnexal mass. Other: Negative for free air or free fluid. Wide neck ventral hernia containing colon and small bowel as well as mesenteric fat, this does not appear to be the source of obstruction. Musculoskeletal: Severe scoliosis and multilevel degenerative changes. Advanced bilateral hip arthritis. Chronic inferior pubic ramus fracture on the right. IMPRESSION: 1. Findings consistent with high-grade small bowel obstruction with transition point in the left lower quadrant, similar location as compared with previous exams and potentially due to adhesive disease. Mid to distal small bowel is completely decompressed beyond the transition point. 2. Left abdominal colostomy. Diverticular disease of the colon without acute inflammatory process. 3. Wide neck ventral hernia containing colon, small bowel and mesenteric fat but without signs for incarceration or obstruction related to this finding. 4. Stable post treatment changes of the left kidney. 5. Aortic atherosclerosis. Aortic Atherosclerosis (ICD10-I70.0). Electronically Signed   By: Donavan Foil M.D.   On: 09/26/2022 19:15   DG Abdomen 1 View  Result Date: 09/26/2022 CLINICAL DATA:  Nausea vomiting EXAM: ABDOMEN - 1 VIEW COMPARISON:  07/07/2020, CT 07/10/2021 FINDINGS: Right-side-up decubitus view obtained. No gross free air. Air distended bowel with fluid levels raising concern for bowel obstruction. IMPRESSION: Air distended bowel with fluid levels raising concern for bowel obstruction. Electronically Signed   By: Donavan Foil M.D.   On: 09/26/2022 18:00     Medical Consultants:  None.   Subjective:    [name] no complaints  Objective:    Vitals:   09/26/22 2047 09/26/22 2120 09/26/22 2335 09/27/22 0342  BP:  104/73 128/77 118/81  Pulse:  (!) 105 85 87   Resp:  16 12 16   Temp: 99.3 F (37.4 C) 98.2 F (36.8 C) 98.1 F (36.7 C) 97.8 F (36.6 C)  TempSrc: Oral Oral Oral Oral  SpO2:  95% 98% 98%  Weight:      Height:       SpO2: 98 %   Intake/Output Summary (Last 24 hours) at 09/27/2022 0717 Last data filed at 09/27/2022 0600 Gross per 24 hour  Intake 493.06 ml  Output 1550 ml  Net -1056.94 ml   Filed Weights   09/26/22 1628  Weight: 68 kg    Exam: General exam: In no acute distress. Respiratory system: Good air movement and clear to auscultation. Cardiovascular system: S1 & S2 heard, RRR. No JVD. Gastrointestinal system: Abdomen is nondistended, soft and nontender.  Extremities: No pedal edema. Skin: No rashes, lesions or ulcers Psychiatry: Judgement and insight appear normal. Mood & affect appropriate.    Data Reviewed:    Labs: Basic Metabolic Panel: Recent Labs  Lab 09/26/22 1637 09/27/22 0452  NA 138 142  K 3.7 3.5  CL 97* 96*  CO2 29 31  GLUCOSE 143* 145*  BUN 24* 30*  CREATININE 0.96 1.24*  CALCIUM 10.2 10.3  MG  --  2.1  PHOS  --  7.5*   GFR Estimated Creatinine Clearance: 45 mL/min (A) (by C-G formula based on SCr of 1.24 mg/dL (H)). Liver Function Tests: Recent Labs  Lab 09/26/22 1637  AST 29  ALT 18  ALKPHOS 89  BILITOT 1.1  PROT 8.5*  ALBUMIN 4.5   Recent Labs  Lab 09/26/22 1637  LIPASE 33   No results for input(s): "AMMONIA" in the last 168 hours. Coagulation profile No results for input(s): "INR", "PROTIME" in the last 168 hours. COVID-19 Labs  No results for input(s): "DDIMER", "FERRITIN", "LDH", "CRP" in the last 72 hours.  Lab Results  Component Value Date   SARSCOV2NAA POSITIVE (A) 07/04/2020   Media NEGATIVE 05/07/2020    CBC: Recent Labs  Lab 09/26/22 1637 09/27/22 0452  WBC 8.4 5.2  HGB 12.9 12.3  HCT 40.7 38.5  MCV 96.7 98.0  PLT 330 299   Cardiac Enzymes: No results for input(s): "CKTOTAL", "CKMB", "CKMBINDEX", "TROPONINI" in the last 168  hours. BNP (last 3 results) No results for input(s): "PROBNP" in the last 8760 hours. CBG: Recent Labs  Lab 09/26/22 2332 09/27/22 0339  GLUCAP 141* 136*   D-Dimer: No results for input(s): "DDIMER" in the last 72 hours. Hgb A1c: No results for input(s): "HGBA1C" in the last 72 hours. Lipid Profile: No results for input(s): "CHOL", "HDL", "LDLCALC", "TRIG", "CHOLHDL", "LDLDIRECT" in the last 72 hours. Thyroid function studies: No results for input(s): "TSH", "T4TOTAL", "T3FREE", "THYROIDAB" in the last 72 hours.  Invalid input(s): "FREET3" Anemia work up: No results for input(s): "VITAMINB12", "FOLATE", "FERRITIN", "TIBC", "IRON", "RETICCTPCT" in the last 72 hours. Sepsis Labs: Recent Labs  Lab 09/26/22 1637 09/27/22 0452  WBC 8.4 5.2   Microbiology No results found for this or any previous visit (from the past 240 hour(s)).   Medications:    enoxaparin (LOVENOX) injection  40 mg Subcutaneous Q24H   insulin aspart  0-9 Units Subcutaneous Q4H   lidocaine  15 mL Mouth/Throat Once   lip balm  Topical BID   pantoprazole (PROTONIX) IV  40 mg Intravenous Q24H   Continuous Infusions:  lactated ringers     lactated ringers 75 mL/hr at 09/26/22 2325   methocarbamol (ROBAXIN) IV     ondansetron (ZOFRAN) IV        LOS: 1 day   Charlynne Cousins  [facility]  09/27/2022, 7:17 AM

## 2022-09-28 ENCOUNTER — Inpatient Hospital Stay (HOSPITAL_COMMUNITY): Payer: 59

## 2022-09-28 DIAGNOSIS — K56609 Unspecified intestinal obstruction, unspecified as to partial versus complete obstruction: Secondary | ICD-10-CM | POA: Diagnosis not present

## 2022-09-28 DIAGNOSIS — F1011 Alcohol abuse, in remission: Secondary | ICD-10-CM | POA: Diagnosis not present

## 2022-09-28 DIAGNOSIS — K565 Intestinal adhesions [bands], unspecified as to partial versus complete obstruction: Secondary | ICD-10-CM | POA: Diagnosis not present

## 2022-09-28 DIAGNOSIS — R1114 Bilious vomiting: Secondary | ICD-10-CM | POA: Diagnosis not present

## 2022-09-28 LAB — GLUCOSE, CAPILLARY
Glucose-Capillary: 105 mg/dL — ABNORMAL HIGH (ref 70–99)
Glucose-Capillary: 80 mg/dL (ref 70–99)
Glucose-Capillary: 88 mg/dL (ref 70–99)
Glucose-Capillary: 107 mg/dL — ABNORMAL HIGH (ref 70–99)
Glucose-Capillary: 136 mg/dL — ABNORMAL HIGH (ref 70–99)
Glucose-Capillary: 122 mg/dL — ABNORMAL HIGH (ref 70–99)

## 2022-09-28 LAB — BASIC METABOLIC PANEL
Anion gap: 9 (ref 5–15)
BUN: 39 mg/dL — ABNORMAL HIGH (ref 8–23)
CO2: 29 mmol/L (ref 22–32)
Calcium: 8.1 mg/dL — ABNORMAL LOW (ref 8.9–10.3)
Chloride: 104 mmol/L (ref 98–111)
Creatinine, Ser: 0.91 mg/dL (ref 0.44–1.00)
GFR, Estimated: >60 mL/min (ref >60)
Glucose, Bld: 93 mg/dL (ref 70–99)
Potassium: 4.1 mmol/L (ref 3.5–5.1)
Sodium: 142 mmol/L (ref 135–145)

## 2022-09-28 LAB — MAGNESIUM: Magnesium: 2 mg/dL (ref 1.7–2.4)

## 2022-09-28 MED ORDER — CHLORHEXIDINE GLUCONATE CLOTH 2 % EX PADS
6.0000 | MEDICATED_PAD | Freq: Every day | CUTANEOUS | Status: DC
  Administered 2022-09-28 – 2022-09-29 (×2): 6 via TOPICAL

## 2022-09-28 MED ORDER — LACTATED RINGERS IV SOLN: INTRAVENOUS | Status: AC

## 2022-09-28 NOTE — Progress Notes (Signed)
Notified MD of cardiac order expiring, informed to DC order.

## 2022-09-28 NOTE — Progress Notes (Signed)
[facility] Progress Note     Subjective: CC:  Denies abdominal pain. Reports flatus from her ostomy. Per RN there was also   Objective: Vital signs in last 24 hours: Temp:  [97.7 F (36.5 C)-98.7 F (37.1 C)] 98.7 F (37.1 C) (04/05 0436) Pulse Rate:  [75-78] 78 (04/05 0436) Resp:  [16-18] 16 (04/05 0436) BP: (103-123)/(61-73) 112/66 (04/05 0436) SpO2:  [95 %-100 %] 95 % (04/05 0436)    Intake/Output from previous day: 04/04 0701 - 04/05 0700 In: 1530 [I.V.:1530] Out: 1850 [Urine:550; Emesis/NG output:1300] Intake/Output this shift: No intake/output data recorded.  PE: Gen:  Alert, NAD, pleasant Card:  Regular rate and rhythm, pedal pulses 2+ BL Pulm:  Normal effort, clear to auscultation bilaterally Abd: Soft, mild distention, ventral hernia is soft and temporarily reduces  NGT in place with clear/bilious effluent in tubing  Skin: warm and dry, no rashes  Psych: A&Ox3   Lab Results:  Recent Labs    09/26/22 1637 09/27/22 0452  WBC 8.4 5.2  HGB 12.9 12.3  HCT 40.7 38.5  PLT 330 299   BMET Recent Labs    09/27/22 0452 09/28/22 0427  NA 142 142  K 3.5 4.1  CL 96* 104  CO2 31 29  GLUCOSE 145* 93  BUN 30* 39*  CREATININE 1.24* 0.91  CALCIUM 10.3 8.1*   PT/INR No results for input(s): "LABPROT", "INR" in the last 72 hours. CMP     Component Value Date/Time   NA 142 09/28/2022 0427   NA 143 09/10/2016 0000   K 4.1 09/28/2022 0427   CL 104 09/28/2022 0427   CO2 29 09/28/2022 0427   GLUCOSE 93 09/28/2022 0427   BUN 39 (H) 09/28/2022 0427   BUN 14 09/10/2016 0000   CREATININE 0.91 09/28/2022 0427   CREATININE 0.76 11/19/2017 1024   CALCIUM 8.1 (L) 09/28/2022 0427   PROT 8.5 (H) 09/26/2022 1637   ALBUMIN 4.5 09/26/2022 1637   AST 29 09/26/2022 1637   ALT 18 09/26/2022 1637   ALKPHOS 89 09/26/2022 1637   BILITOT 1.1 09/26/2022 1637   GFRNONAA >60 09/28/2022 0427   GFRNONAA 85 11/19/2017 1024   GFRAA 98 11/19/2017 1024   Lipase      Component Value Date/Time   LIPASE 33 09/26/2022 1637       Studies/Results: DG Abd 1 View  Result Date: 09/28/2022 CLINICAL DATA:  Small-bowel obstruction EXAM: ABDOMEN - 1 VIEW COMPARISON:  Abdominal radiograph 09/27/2022 FINDINGS: Enteric contrast is predominantly visualized in the colon. Paucity of small bowel gas limits the ability to assess for obstruction. Colonic diverticuli are present. Retained contrast is seen in a right-sided bladder diverticulum. Severe degenerative changes of the bilateral hip joints. Severe lumbar spine degenerative changes with levoscoliosis. Lung bases are clear. Enteric tube projects over right lower quadrant IMPRESSION: Enteric contrast is predominantly visualized in the colon. Paucity of small bowel gas. No supine evidence of pneumoperitoneum. Enteric tube projects over the right lower quadrant. Electronically Signed   By: Lorenza Cambridge M.D.   On: 09/28/2022 07:38   DG Abd Portable 1V-Small Bowel Obstruction Protocol-initial, 8 hr delay  Result Date: 09/27/2022 CLINICAL DATA:  8 hour delay film EXAM: PORTABLE ABDOMEN - 1 VIEW COMPARISON:  Radiographs 09/26/2022.  CT 09/26/2022 and 12/18/2021. FINDINGS: 0754 hours. Two supine views of the abdomen are submitted. There is a small amount of diluted contrast throughout multiple dilated loops of small bowel. There is probably a small amount of contrast within the decompressed  cecum. No extravasated contrast identified. Contrast material is also present in the bladder from the recent CT. Enteric tube projects over the mid stomach. Severe convex left thoracolumbar scoliosis, multilevel spondylosis and right greater than left hip arthropathy are noted. IMPRESSION: Small amount of diluted contrast throughout multiple dilated loops of small bowel consistent with high-grade partial small bowel obstruction. Electronically Signed   By: Carey Bullocks M.D.   On: 09/27/2022 08:10   DG Abd Portable 1V-Small Bowel  Protocol-Position Verification  Result Date: 09/26/2022 CLINICAL DATA:  Confirm NG tube placement EXAM: PORTABLE ABDOMEN - 1 VIEW COMPARISON:  Radiographs earlier today at 5:45 p.m. FINDINGS: Enteric tube tip in the stomach with and side port near the GE junction. Scoliosis. The lungs are clear. IMPRESSION: Enteric tube tip in the stomach with and side port at the GE junction. Electronically Signed   By: Minerva Fester M.D.   On: 09/26/2022 20:31   CT ABDOMEN PELVIS W CONTRAST  Result Date: 09/26/2022 CLINICAL DATA:  Colostomy vomiting nausea EXAM: CT ABDOMEN AND PELVIS WITH CONTRAST TECHNIQUE: Multidetector CT imaging of the abdomen and pelvis was performed using the standard protocol following bolus administration of intravenous contrast. RADIATION DOSE REDUCTION: This exam was performed according to the departmental dose-optimization program which includes automated exposure control, adjustment of the mA and/or kV according to patient size and/or use of iterative reconstruction technique. Technical malfunction resulted in only delayed phase imaging. CONTRAST:  39mL OMNIPAQUE IOHEXOL 300 MG/ML  SOLN COMPARISON:  12/18/2021, 12/08/2021, 01/16/2021, 07/04/2020 and previous exams dating back to 2016. FINDINGS: Lower chest: Lung bases demonstrate no acute airspace disease. Coronary vascular calcification. Hepatobiliary: No calcified gallstones. No biliary dilatation. Hepatic cysts. Pancreas: No inflammation. Stable dilatation of the proximal duct measuring up to 7 mm. Spleen: Normal in size without focal abnormality. Adrenals/Urinary Tract: Adrenal glands are within normal limits. Limited assessment of kidneys due to delayed phase imaging. Post treatment changes at the posterior cortex of left kidney without significant change. No hydronephrosis. The bladder is unremarkable aside from small posterior bladder diverticula. Stomach/Bowel: Fluid distension of the distal stomach, duodenum and jejunum. Fluid-filled  distended proximal small bowel measuring up to 4.4 cm. Transition to decompressed small bowel in the left lower quadrant, series 2, image 51, similar location as compared with previous exams. The small bowel distal to the transition point is completely decompressed. Negative appendix. Left abdominal colostomy. Diverticular disease of the left colon. Vascular/Lymphatic: Advanced aortic atherosclerosis. No aneurysm. No suspicious lymph nodes. Reproductive: Uterus unremarkable.  No adnexal mass. Other: Negative for free air or free fluid. Wide neck ventral hernia containing colon and small bowel as well as mesenteric fat, this does not appear to be the source of obstruction. Musculoskeletal: Severe scoliosis and multilevel degenerative changes. Advanced bilateral hip arthritis. Chronic inferior pubic ramus fracture on the right. IMPRESSION: 1. Findings consistent with high-grade small bowel obstruction with transition point in the left lower quadrant, similar location as compared with previous exams and potentially due to adhesive disease. Mid to distal small bowel is completely decompressed beyond the transition point. 2. Left abdominal colostomy. Diverticular disease of the colon without acute inflammatory process. 3. Wide neck ventral hernia containing colon, small bowel and mesenteric fat but without signs for incarceration or obstruction related to this finding. 4. Stable post treatment changes of the left kidney. 5. Aortic atherosclerosis. Aortic Atherosclerosis (ICD10-I70.0). Electronically Signed   By: Jasmine Pang M.D.   On: 09/26/2022 19:15   DG Abdomen 1 View  Result Date:  09/26/2022 CLINICAL DATA:  Nausea vomiting EXAM: ABDOMEN - 1 VIEW COMPARISON:  07/07/2020, CT 07/10/2021 FINDINGS: Right-side-up decubitus view obtained. No gross free air. Air distended bowel with fluid levels raising concern for bowel obstruction. IMPRESSION: Air distended bowel with fluid levels raising concern for bowel  obstruction. Electronically Signed   By: Jasmine Pang M.D.   On: 09/26/2022 18:00    Anti-infectives: Anti-infectives (From admission, onward)    None        Assessment/Plan 67 y/o F with history of marfan's syndrome, RA on immunosuppression, and multiple abdominal surgeries with a chronic incisional hernia/loss of domain who was admitted with SBO.  - NGT in place, SBO protocol >> contrast in colon along with return of bowel function. - clamp trial NG tube this morning, possible removal and diet advancement this afternoon  - no emergent surgical needs, hopefully she will improve with non-operative measures. Failure to improve may warrant surgery which would be exploratory laparotomy lysis of adhesions, possible bowel resection. She would be at increased risk for poor wound healing, dehiscence, anastomotic leak, prolonged hospital stay, and need for SNF after surgery due to her age, comorbidities, and immunosuppression (on weekly abatacept and PRN predisone at home).    LOS: 2 days   I reviewed nursing notes, hospitalist notes, last 24 h vitals and pain scores, last 48 h intake and output, last 24 h labs and trends, and last 24 h imaging results.  This care required moderate level of medical decision making.   Hosie Spangle, PA-C [facility] Please see Amion for pager number during day hours 7:00am-4:30pm

## 2022-09-28 NOTE — Progress Notes (Signed)
PT Cancellation Note  Patient Details Name: [name] MRN: [id number] DOB: [birth year]   Cancelled Treatment:    Reason Eval/Treat Not Completed: Fatigue/lethargy limiting ability to participate. Patient was up all morning and recently assisted back into bed. Will check back another time.  Blanchard Kelch PT Acute Rehabilitation Services Office [phone] Weekend pager-[phone]    Rada Hay 09/28/2022, 1:54 PM

## 2022-09-28 NOTE — Progress Notes (Signed)
[facility] PROGRESS NOTE    Progress Note  Christina RotundaCarrie I Shartzer  JYN:[id number] DOB: December 27, 1955 DOA: 09/26/2022 PCP: Nelwyn SalisburyFry, Stephen A, MD     Brief Narrative:   [name] is an 67 y.o. female past medical history significant for blindness, diverticulitis with perforation status post colectomy and colostomy give 3 small bowel obstruction hernia repair rheumatoid arthritis on methotrexate, history of CVA [facility] complaining of nausea vomiting and abdominal pain that started about the day prior to admission.  She was found tachycardic at 116 abdominal x-ray was done that raise concern for bowel obstruction CT scan of the abdomen and pelvis was consistent with high-grade small bowel obstruction with a transition point in the left lower quadrant.  General surgery has been consulted NG tube was placed   Assessment/Plan:   Small bowel obstruction due to adhesions NG tube placed to intermittent suction, with good output Abdominal x-ray yesterday showed no contrast diluted throughout small bowel General surgery was consulted Continue to monitor and replete electrolytes. Ambulate and minimize narcotics. Patient relates she is passing gas.  New acute kidney injury: Likely prerenal azotemia, resolved with fluid resuscitation.  Prolonged QTc: With a twelve-lead EKG that showed QTc of 560. Try to keep potassium greater than 4 magnesium greater than 2. Her potassium this morning is 3.5 started on IV potassium supplementation recheck basic metabolic panel in the morning.  Essential hypertension: Antihypertensive medications were held on admission. Blood pressures remain relatively stable continue IV metoprolol IV as needed. She has mild tachycardia likely due to pain.  GERD: Continue Protonix.  Rheumatoid arthritis: She received her methotrexate injection prior to admission. Will be vigilant about infection. She has remained afebrile, with no  leukocytosis.  Hyperglycemia with no history of diabetes mellitus, A1c of 5.5, continue sliding scale insulin blood glucose relatively well-controlled.  Legally blind Noted.  DVT prophylaxis: lovenox Family Communication:none Status is: Inpatient Remains inpatient appropriate because: Small bowel obstruction    Code Status:     Code Status Orders  (From admission, onward)           Start     Ordered   09/26/22 1926  Full code  Continuous       Question:  By:  Answer:  Consent: discussion documented in EHR   09/26/22 1926           Code Status History     Date Active Date Inactive Code Status Order ID Comments User Context   07/04/2020 2258 07/09/2020 1428 Full Code [id number]  Charlsie QuestPatel, Vishal R, MD ED   05/07/2020 1347 05/13/2020 0121 Full Code [id number]  Jae DireSegal, Jared E, MD ED   08/07/2016 2133 08/08/2016 2252 Full Code [id number]  Danford, Earl Liteshristopher P, MD Inpatient   06/07/2015 0152 06/14/2015 2156 Full Code [id number]  Eduard ClosKakrakandy, Arshad N, MD Inpatient   11/22/2014 1155 11/27/2014 2251 Full Code [id number]  Elgergawy, Leana Roeawood S, MD Inpatient   05/01/2014 1619 05/11/2014 2159 Full Code [id number]  Meredeth IdeLama, Gagan S, MD Inpatient   01/04/2014 1849 01/07/2014 1450 Full Code [id number]  Marinus Mawaylor, Gregg W, MD Inpatient   12/31/2013 1835 01/04/2014 1849 Full Code [id number]  Kela MillinViyuoh, Adeline C, MD Inpatient   02/26/2013 0257 02/28/2013 2254 Full Code [id number]  Glenna FellowsHoxworth, Benjamin, MD Inpatient         IV Access:   Peripheral IV   Procedures and diagnostic studies:   DG Abd Portable 1V-Small Bowel Obstruction Protocol-initial, 8 hr delay  Result Date: 09/27/2022 CLINICAL DATA:  8 hour delay film EXAM: PORTABLE ABDOMEN - 1 VIEW COMPARISON:  Radiographs 09/26/2022.  CT 09/26/2022 and 12/18/2021. FINDINGS: 0754 hours. Two supine views of the abdomen are submitted. There is a small amount of diluted contrast throughout multiple dilated loops of small bowel. There is probably a small amount of  contrast within the decompressed cecum. No extravasated contrast identified. Contrast material is also present in the bladder from the recent CT. Enteric tube projects over the mid stomach. Severe convex left thoracolumbar scoliosis, multilevel spondylosis and right greater than left hip arthropathy are noted. IMPRESSION: Small amount of diluted contrast throughout multiple dilated loops of small bowel consistent with high-grade partial small bowel obstruction. Electronically Signed   By: Carey Bullocks M.D.   On: 09/27/2022 08:10   DG Abd Portable 1V-Small Bowel Protocol-Position Verification  Result Date: 09/26/2022 CLINICAL DATA:  Confirm NG tube placement EXAM: PORTABLE ABDOMEN - 1 VIEW COMPARISON:  Radiographs earlier today at 5:45 p.m. FINDINGS: Enteric tube tip in the stomach with and side port near the GE junction. Scoliosis. The lungs are clear. IMPRESSION: Enteric tube tip in the stomach with and side port at the GE junction. Electronically Signed   By: Minerva Fester M.D.   On: 09/26/2022 20:31   CT ABDOMEN PELVIS W CONTRAST  Result Date: 09/26/2022 CLINICAL DATA:  Colostomy vomiting nausea EXAM: CT ABDOMEN AND PELVIS WITH CONTRAST TECHNIQUE: Multidetector CT imaging of the abdomen and pelvis was performed using the standard protocol following bolus administration of intravenous contrast. RADIATION DOSE REDUCTION: This exam was performed according to the departmental dose-optimization program which includes automated exposure control, adjustment of the mA and/or kV according to patient size and/or use of iterative reconstruction technique. Technical malfunction resulted in only delayed phase imaging. CONTRAST:  70mL OMNIPAQUE IOHEXOL 300 MG/ML  SOLN COMPARISON:  12/18/2021, 12/08/2021, 01/16/2021, 07/04/2020 and previous exams dating back to 2016. FINDINGS: Lower chest: Lung bases demonstrate no acute airspace disease. Coronary vascular calcification. Hepatobiliary: No calcified gallstones. No  biliary dilatation. Hepatic cysts. Pancreas: No inflammation. Stable dilatation of the proximal duct measuring up to 7 mm. Spleen: Normal in size without focal abnormality. Adrenals/Urinary Tract: Adrenal glands are within normal limits. Limited assessment of kidneys due to delayed phase imaging. Post treatment changes at the posterior cortex of left kidney without significant change. No hydronephrosis. The bladder is unremarkable aside from small posterior bladder diverticula. Stomach/Bowel: Fluid distension of the distal stomach, duodenum and jejunum. Fluid-filled distended proximal small bowel measuring up to 4.4 cm. Transition to decompressed small bowel in the left lower quadrant, series 2, image 51, similar location as compared with previous exams. The small bowel distal to the transition point is completely decompressed. Negative appendix. Left abdominal colostomy. Diverticular disease of the left colon. Vascular/Lymphatic: Advanced aortic atherosclerosis. No aneurysm. No suspicious lymph nodes. Reproductive: Uterus unremarkable.  No adnexal mass. Other: Negative for free air or free fluid. Wide neck ventral hernia containing colon and small bowel as well as mesenteric fat, this does not appear to be the source of obstruction. Musculoskeletal: Severe scoliosis and multilevel degenerative changes. Advanced bilateral hip arthritis. Chronic inferior pubic ramus fracture on the right. IMPRESSION: 1. Findings consistent with high-grade small bowel obstruction with transition point in the left lower quadrant, similar location as compared with previous exams and potentially due to adhesive disease. Mid to distal small bowel is completely decompressed beyond the transition point. 2. Left abdominal colostomy. Diverticular disease of the colon without acute inflammatory process. 3. Wide neck ventral hernia  containing colon, small bowel and mesenteric fat but without signs for incarceration or obstruction related to  this finding. 4. Stable post treatment changes of the left kidney. 5. Aortic atherosclerosis. Aortic Atherosclerosis (ICD10-I70.0). Electronically Signed   By: Jasmine Pang M.D.   On: 09/26/2022 19:15   DG Abdomen 1 View  Result Date: 09/26/2022 CLINICAL DATA:  Nausea vomiting EXAM: ABDOMEN - 1 VIEW COMPARISON:  07/07/2020, CT 07/10/2021 FINDINGS: Right-side-up decubitus view obtained. No gross free air. Air distended bowel with fluid levels raising concern for bowel obstruction. IMPRESSION: Air distended bowel with fluid levels raising concern for bowel obstruction. Electronically Signed   By: Jasmine Pang M.D.   On: 09/26/2022 18:00     Medical Consultants:   None.   Subjective:    [name] has no pain, is passing gas  Objective:    Vitals:   09/27/22 0941 09/27/22 1347 09/27/22 2116 09/28/22 0436  BP: 103/61 123/73 111/66 112/66  Pulse: 77 75 77 78  Resp: 18 18 18 16   Temp: 97.7 F (36.5 C) 97.8 F (36.6 C) 98.2 F (36.8 C) 98.7 F (37.1 C)  TempSrc: Oral Oral Oral Oral  SpO2: 100% 100% 100% 95%  Weight:      Height:       SpO2: 95 %   Intake/Output Summary (Last 24 hours) at 09/28/2022 0739 Last data filed at 09/28/2022 7342 Gross per 24 hour  Intake 1530 ml  Output 1850 ml  Net -320 ml    Filed Weights   09/26/22 1628  Weight: 68 kg    Exam: General exam: In no acute distress. Respiratory system: Good air movement and clear to auscultation. Cardiovascular system: S1 & S2 heard, RRR. No JVD. Gastrointestinal system: Abdomen is nondistended, soft and nontender.  Extremities: No pedal edema. Skin: No rashes, lesions or ulcers Psychiatry: Judgement and insight appear normal. Mood & affect appropriate.  Data Reviewed:    Labs: Basic Metabolic Panel: Recent Labs  Lab 09/26/22 1637 09/27/22 0452 09/28/22 0427  NA 138 142 142  K 3.7 3.5 4.1  CL 97* 96* 104  CO2 29 31 29   GLUCOSE 143* 145* 93  BUN 24* 30* 39*  CREATININE 0.96 1.24* 0.91   CALCIUM 10.2 10.3 8.1*  MG  --  2.1 2.0  PHOS  --  7.5*  --     GFR Estimated Creatinine Clearance: 61.3 mL/min (by C-G formula based on SCr of 0.91 mg/dL). Liver Function Tests: Recent Labs  Lab 09/26/22 1637  AST 29  ALT 18  ALKPHOS 89  BILITOT 1.1  PROT 8.5*  ALBUMIN 4.5    Recent Labs  Lab 09/26/22 1637  LIPASE 33    No results for input(s): "AMMONIA" in the last 168 hours. Coagulation profile No results for input(s): "INR", "PROTIME" in the last 168 hours. COVID-19 Labs  No results for input(s): "DDIMER", "FERRITIN", "LDH", "CRP" in the last 72 hours.  Lab Results  Component Value Date   SARSCOV2NAA POSITIVE (A) 07/04/2020   SARSCOV2NAA NEGATIVE 05/07/2020    CBC: Recent Labs  Lab 09/26/22 1637 09/27/22 0452  WBC 8.4 5.2  HGB 12.9 12.3  HCT 40.7 38.5  MCV 96.7 98.0  PLT 330 299    Cardiac Enzymes: No results for input(s): "CKTOTAL", "CKMB", "CKMBINDEX", "TROPONINI" in the last 168 hours. BNP (last 3 results) No results for input(s): "PROBNP" in the last 8760 hours. CBG: Recent Labs  Lab 09/27/22 1140 09/27/22 1622 09/27/22 1930 09/27/22 2305 09/28/22 8768  GLUCAP 84 77 90 101* 105*    D-Dimer: No results for input(s): "DDIMER" in the last 72 hours. Hgb A1c: No results for input(s): "HGBA1C" in the last 72 hours. Lipid Profile: No results for input(s): "CHOL", "HDL", "LDLCALC", "TRIG", "CHOLHDL", "LDLDIRECT" in the last 72 hours. Thyroid function studies: No results for input(s): "TSH", "T4TOTAL", "T3FREE", "THYROIDAB" in the last 72 hours.  Invalid input(s): "FREET3" Anemia work up: No results for input(s): "VITAMINB12", "FOLATE", "FERRITIN", "TIBC", "IRON", "RETICCTPCT" in the last 72 hours. Sepsis Labs: Recent Labs  Lab 09/26/22 1637 09/27/22 0452  WBC 8.4 5.2    Microbiology No results found for this or any previous visit (from the past 240 hour(s)).   Medications:    enoxaparin (LOVENOX) injection  40 mg  Subcutaneous Q24H   insulin aspart  0-9 Units Subcutaneous Q4H   lidocaine  15 mL Mouth/Throat Once   lip balm   Topical BID   pantoprazole (PROTONIX) IV  40 mg Intravenous Q24H   Continuous Infusions:  0.9 % NaCl with KCl 40 mEq / L 75 mL/hr at 09/27/22 2221   lactated ringers     lactated ringers 75 mL/hr at 09/26/22 2325   methocarbamol (ROBAXIN) IV     ondansetron (ZOFRAN) IV        LOS: 2 days   Marinda Elk  [facility]  09/28/2022, 7:39 AM

## 2022-09-29 DIAGNOSIS — R1114 Bilious vomiting: Secondary | ICD-10-CM | POA: Diagnosis not present

## 2022-09-29 DIAGNOSIS — Z9049 Acquired absence of other specified parts of digestive tract: Secondary | ICD-10-CM

## 2022-09-29 DIAGNOSIS — K565 Intestinal adhesions [bands], unspecified as to partial versus complete obstruction: Secondary | ICD-10-CM | POA: Diagnosis not present

## 2022-09-29 DIAGNOSIS — K56609 Unspecified intestinal obstruction, unspecified as to partial versus complete obstruction: Secondary | ICD-10-CM | POA: Diagnosis not present

## 2022-09-29 LAB — BASIC METABOLIC PANEL
Anion gap: 6 (ref 5–15)
BUN: 20 mg/dL (ref 8–23)
CO2: 26 mmol/L (ref 22–32)
Calcium: 7.9 mg/dL — ABNORMAL LOW (ref 8.9–10.3)
Chloride: 109 mmol/L (ref 98–111)
Creatinine, Ser: 0.61 mg/dL (ref 0.44–1.00)
GFR, Estimated: >60 mL/min (ref >60)
Glucose, Bld: 100 mg/dL — ABNORMAL HIGH (ref 70–99)
Potassium: 3.8 mmol/L (ref 3.5–5.1)
Sodium: 141 mmol/L (ref 135–145)

## 2022-09-29 LAB — GLUCOSE, CAPILLARY
Glucose-Capillary: 100 mg/dL — ABNORMAL HIGH (ref 70–99)
Glucose-Capillary: 85 mg/dL (ref 70–99)
Glucose-Capillary: 119 mg/dL — ABNORMAL HIGH (ref 70–99)

## 2022-09-29 NOTE — Progress Notes (Signed)
[facility] Progress Note     Subjective: CC:  Denies abdominal pain.  Tolerating FLD. Having BMs from her ostomy.   Objective: Vital signs in last 24 hours: Temp:  [97.6 F (36.4 C)-98.8 F (37.1 C)] 98.2 F (36.8 C) (04/06 0546) Pulse Rate:  [73-87] 73 (04/06 0546) Resp:  [16-18] 16 (04/06 0546) BP: (123-139)/(66-83) 123/66 (04/06 0546) SpO2:  [98 %-100 %] 98 % (04/06 0546) Last BM Date : 09/28/22  Intake/Output from previous day: 04/05 0701 - 04/06 0700 In: 883.3 [P.O.:660; I.V.:223.3] Out: 1475 [Urine:1300; Stool:175] Intake/Output this shift: No intake/output data recorded.  PE: Gen:  Alert, NAD, pleasant Abd: Soft, non distention, ventral hernia is soft  Skin: warm and dry, no rashes  Psych: A&Ox3   Lab Results:  Recent Labs    09/26/22 1637 09/27/22 0452  WBC 8.4 5.2  HGB 12.9 12.3  HCT 40.7 38.5  PLT 330 299    BMET Recent Labs    09/28/22 0427 09/29/22 0524  NA 142 141  K 4.1 3.8  CL 104 109  CO2 29 26  GLUCOSE 93 100*  BUN 39* 20  CREATININE 0.91 0.61  CALCIUM 8.1* 7.9*    PT/INR No results for input(s): "LABPROT", "INR" in the last 72 hours. CMP     Component Value Date/Time   NA 141 09/29/2022 0524   NA 143 09/10/2016 0000   K 3.8 09/29/2022 0524   CL 109 09/29/2022 0524   CO2 26 09/29/2022 0524   GLUCOSE 100 (H) 09/29/2022 0524   BUN 20 09/29/2022 0524   BUN 14 09/10/2016 0000   CREATININE 0.61 09/29/2022 0524   CREATININE 0.76 11/19/2017 1024   CALCIUM 7.9 (L) 09/29/2022 0524   PROT 8.5 (H) 09/26/2022 1637   ALBUMIN 4.5 09/26/2022 1637   AST 29 09/26/2022 1637   ALT 18 09/26/2022 1637   ALKPHOS 89 09/26/2022 1637   BILITOT 1.1 09/26/2022 1637   GFRNONAA >60 09/29/2022 0524   GFRNONAA 85 11/19/2017 1024   GFRAA 98 11/19/2017 1024   Lipase     Component Value Date/Time   LIPASE 33 09/26/2022 1637       Studies/Results: DG Abd 1 View  Result Date: 09/28/2022 CLINICAL DATA:  Small-bowel obstruction  EXAM: ABDOMEN - 1 VIEW COMPARISON:  Abdominal radiograph 09/27/2022 FINDINGS: Enteric contrast is predominantly visualized in the colon. Paucity of small bowel gas limits the ability to assess for obstruction. Colonic diverticuli are present. Retained contrast is seen in a right-sided bladder diverticulum. Severe degenerative changes of the bilateral hip joints. Severe lumbar spine degenerative changes with levoscoliosis. Lung bases are clear. Enteric tube projects over right lower quadrant IMPRESSION: Enteric contrast is predominantly visualized in the colon. Paucity of small bowel gas. No supine evidence of pneumoperitoneum. Enteric tube projects over the right lower quadrant. Electronically Signed   By: Lorenza Cambridge M.D.   On: 09/28/2022 07:38   DG Abd Portable 1V-Small Bowel Obstruction Protocol-initial, 8 hr delay  Result Date: 09/27/2022 CLINICAL DATA:  8 hour delay film EXAM: PORTABLE ABDOMEN - 1 VIEW COMPARISON:  Radiographs 09/26/2022.  CT 09/26/2022 and 12/18/2021. FINDINGS: 0754 hours. Two supine views of the abdomen are submitted. There is a small amount of diluted contrast throughout multiple dilated loops of small bowel. There is probably a small amount of contrast within the decompressed cecum. No extravasated contrast identified. Contrast material is also present in the bladder from the recent CT. Enteric tube projects over the mid stomach. Severe convex left  thoracolumbar scoliosis, multilevel spondylosis and right greater than left hip arthropathy are noted. IMPRESSION: Small amount of diluted contrast throughout multiple dilated loops of small bowel consistent with high-grade partial small bowel obstruction. Electronically Signed   By: Carey Bullocks M.D.   On: 09/27/2022 08:10    Anti-infectives: Anti-infectives (From admission, onward)    None        Assessment/Plan 67 y/o F with history of marfan's syndrome, RA on immunosuppression, and multiple abdominal surgeries with a  chronic incisional hernia/loss of domain who was admitted with SBO.  - NGT out.  Advance diet as tolerated.  Can be discharged once tolerating solid foods    LOS: 3 days   I reviewed nursing notes, hospitalist notes, last 24 h vitals and pain scores, last 48 h intake and output, last 24 h labs and trends, and last 24 h imaging results.  This care required straight forward level of medical decision making.   Vanita Panda, MD  Colorectal and General Surgery [facility]

## 2022-09-29 NOTE — Discharge Summary (Signed)
Physician Discharge Summary  [name] NVB:[id number] DOB: Jun 04, 1956 DOA: 09/26/2022  PCP: Nelwyn Salisbury, MD  Admit date: 09/26/2022 Discharge date: 09/29/2022  Admitted From: Home Disposition:  Home  Recommendations for Outpatient Follow-up:  Follow up with PCP in 1-2 weeks Please obtain BMP/CBC in one week :  Home Health:No Equipment/Devices:None  Discharge Condition:Stable CODE STATUS:Full Diet recommendation: Heart Healthy   Brief/Interim Summary: 67 y.o. female past medical history significant for blindness, diverticulitis with perforation status post colectomy and colostomy give 3 small bowel obstruction hernia repair rheumatoid arthritis on methotrexate, history of CVA comes into the [facility] long ED complaining of nausea vomiting and abdominal pain that started about the day prior to admission.  She was found tachycardic at 116 abdominal x-ray was done that raise concern for bowel obstruction CT scan of the abdomen and pelvis was consistent with high-grade small bowel obstruction with a transition point in the left lower quadrant.  General surgery has been consulted NG tube was placed   Discharge Diagnoses:  Principal Problem:   Small bowel obstruction due to adhesions Active Problems:   ABUSE, ALCOHOL, IN REMISSION   Legally blind   Rheumatoid arthritis (HCC)   Marfan's syndrome   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 11/28/2012   Incisional hernia   Constipation, chronic   Pulmonary HTN (HCC)   Chronic combined systolic and diastolic heart failure   CHF (congestive heart failure)   Colostomy in place   History of colonic diverticulitis  Small bowel obstruction due to adhesion: NG tube was placed with intermittent suction General surgery was consulted recommended conservative management with started on IV fluids electrolytes were monitored and repleted. NG tube discontinued she was passing gas tolerating her diet she was discharged in stable  condition.  Acute kidney injury: Likely prerenal azotemia resolved with IV fluid resuscitation.  Prolonged QTc: Electrolytes were repleted potassium was kept greater than 4 magnesium greater than 2 no events.  Essential hypertension: No changes made to her medication.  GERD: Continue Protonix.  Rheumatoid arthritis: No changes made to her medication.  Hyperglycemia without history of diabetes mellitus type 2: With an A1c of 5.5 likely reactive.  Legally blind: Noted.  Discharge Instructions  Discharge Instructions     Diet - low sodium heart healthy   Complete by: As directed    Increase activity slowly   Complete by: As directed       Allergies as of 09/29/2022       Reactions   Spinach Itching, Rash, Other (See Comments)   Welts.        Medication List     TAKE these medications    Abatacept 125 MG/ML Sosy Inject 125 mg into the skin once a week. Has on Sunday   albuterol 108 (90 Base) MCG/ACT inhaler Commonly known as: VENTOLIN HFA INHALE 1 PUFF INTO THE LUNGS EVERY 4 HOURS AS NEEDED FOR WHEEZING OR SHORTNESS OF BREATH What changed: See the new instructions.   allopurinol 100 MG tablet Commonly known as: ZYLOPRIM TAKE 1 TABLET BY MOUTH EVERY DAY   ascorbic acid 500 MG tablet Commonly known as: VITAMIN C Take 500 mg by mouth daily.   aspirin EC 81 MG tablet Take 1 tablet (81 mg total) by mouth daily.   CALCIUM PO Take 1 tablet by mouth 2 (two) times daily. 600 mg of calcium with vitamin D   cetirizine 10 MG tablet Commonly known as: ZYRTEC Take 1 tablet (10 mg total) by mouth daily.  ciprofloxacin-dexamethasone OTIC suspension Commonly known as: Ciprodex Place 4 drops into both ears 2 (two) times daily. What changed:  when to take this reasons to take this   clobetasol ointment 0.05 % Commonly known as: TEMOVATE Apply 1 Application topically 2 (two) times daily. What changed:  when to take this reasons to take this    cyanocobalamin 500 MCG tablet Commonly known as: VITAMIN B12 Take 500 mcg by mouth daily.   diclofenac sodium 1 % Gel Commonly known as: VOLTAREN Apply 1 application topically 4 (four) times daily as needed (arthritis).   docusate sodium 100 MG capsule Commonly known as: COLACE Take 100 mg by mouth 2 (two) times daily.   esomeprazole 40 MG capsule Commonly known as: NEXIUM TAKE 1 CAPSULE(40 MG) BY MOUTH DAILY IN THE AFTERNOON What changed: See the new instructions.   folic acid 400 MCG tablet Commonly known as: FOLVITE Take 400 mcg by mouth daily.   HYDROcodone-acetaminophen 10-325 MG tablet Commonly known as: NORCO Take 1 tablet by mouth every 6 (six) hours as needed for moderate pain. Start taking on: September 30, 2022   ketoconazole 2 % shampoo Commonly known as: NIZORAL Apply 1 application topically 2 (two) times a week.   methotrexate (PF) 50 MG/2ML injection Inject 0.5 mLs into the muscle once a week. Sunday   metoprolol succinate 25 MG 24 hr tablet Commonly known as: TOPROL-XL Take 1 tablet (25 mg total) by mouth daily.   multivitamin with minerals Tabs tablet Take 1 tablet by mouth daily.   mupirocin ointment 2 % Commonly known as: BACTROBAN Apply 1 Application topically 2 (two) times daily.   ondansetron 8 MG disintegrating tablet Commonly known as: ZOFRAN-ODT Take 1 tablet (8 mg total) by mouth every 8 (eight) hours as needed for nausea or vomiting.   Peridex 0.12 % solution Generic drug: chlorhexidine Use as directed 15 mLs in the mouth or throat 2 (two) times daily as needed (gingivitis).   polyethylene glycol 17 g packet Commonly known as: MIRALAX / GLYCOLAX Take 17 g by mouth daily as needed for mild constipation.   predniSONE 5 MG tablet Commonly known as: DELTASONE Take 5-10 mg by mouth daily as needed (Inflammation).   temazepam 30 MG capsule Commonly known as: RESTORIL Take 30 mg by mouth at bedtime as needed for sleep.   tiZANidine 4 MG  tablet Commonly known as: ZANAFLEX TAKE 1 TABLET(4 MG) BY MOUTH TWICE DAILY What changed: See the new instructions.   traMADol 50 MG tablet Commonly known as: ULTRAM TAKE 1 TO 2 TABLETS(50 TO 100 MG) BY MOUTH EVERY 6 HOURS AS NEEDED What changed: See the new instructions.   triamcinolone lotion 0.1 % Commonly known as: KENALOG Apply topically 2 (two) times daily.   Vitamin D 50 MCG (2000 UT) Caps Take 2,000 Units by mouth daily.        Allergies  Allergen Reactions   Spinach Itching, Rash and Other (See Comments)    Welts.    Consultations: General surgery   Procedures/Studies: DG Abd 1 View  Result Date: 09/28/2022 CLINICAL DATA:  Small-bowel obstruction EXAM: ABDOMEN - 1 VIEW COMPARISON:  Abdominal radiograph 09/27/2022 FINDINGS: Enteric contrast is predominantly visualized in the colon. Paucity of small bowel gas limits the ability to assess for obstruction. Colonic diverticuli are present. Retained contrast is seen in a right-sided bladder diverticulum. Severe degenerative changes of the bilateral hip joints. Severe lumbar spine degenerative changes with levoscoliosis. Lung bases are clear. Enteric tube projects over right lower  quadrant IMPRESSION: Enteric contrast is predominantly visualized in the colon. Paucity of small bowel gas. No supine evidence of pneumoperitoneum. Enteric tube projects over the right lower quadrant. Electronically Signed   By: Lorenza Cambridge M.D.   On: 09/28/2022 07:38   DG Abd Portable 1V-Small Bowel Obstruction Protocol-initial, 8 hr delay  Result Date: 09/27/2022 CLINICAL DATA:  8 hour delay film EXAM: PORTABLE ABDOMEN - 1 VIEW COMPARISON:  Radiographs 09/26/2022.  CT 09/26/2022 and 12/18/2021. FINDINGS: 0754 hours. Two supine views of the abdomen are submitted. There is a small amount of diluted contrast throughout multiple dilated loops of small bowel. There is probably a small amount of contrast within the decompressed cecum. No extravasated  contrast identified. Contrast material is also present in the bladder from the recent CT. Enteric tube projects over the mid stomach. Severe convex left thoracolumbar scoliosis, multilevel spondylosis and right greater than left hip arthropathy are noted. IMPRESSION: Small amount of diluted contrast throughout multiple dilated loops of small bowel consistent with high-grade partial small bowel obstruction. Electronically Signed   By: Carey Bullocks M.D.   On: 09/27/2022 08:10   DG Abd Portable 1V-Small Bowel Protocol-Position Verification  Result Date: 09/26/2022 CLINICAL DATA:  Confirm NG tube placement EXAM: PORTABLE ABDOMEN - 1 VIEW COMPARISON:  Radiographs earlier today at 5:45 p.m. FINDINGS: Enteric tube tip in the stomach with and side port near the GE junction. Scoliosis. The lungs are clear. IMPRESSION: Enteric tube tip in the stomach with and side port at the GE junction. Electronically Signed   By: Minerva Fester M.D.   On: 09/26/2022 20:31   CT ABDOMEN PELVIS W CONTRAST  Result Date: 09/26/2022 CLINICAL DATA:  Colostomy vomiting nausea EXAM: CT ABDOMEN AND PELVIS WITH CONTRAST TECHNIQUE: Multidetector CT imaging of the abdomen and pelvis was performed using the standard protocol following bolus administration of intravenous contrast. RADIATION DOSE REDUCTION: This exam was performed according to the departmental dose-optimization program which includes automated exposure control, adjustment of the mA and/or kV according to patient size and/or use of iterative reconstruction technique. Technical malfunction resulted in only delayed phase imaging. CONTRAST:  43mL OMNIPAQUE IOHEXOL 300 MG/ML  SOLN COMPARISON:  12/18/2021, 12/08/2021, 01/16/2021, 07/04/2020 and previous exams dating back to 2016. FINDINGS: Lower chest: Lung bases demonstrate no acute airspace disease. Coronary vascular calcification. Hepatobiliary: No calcified gallstones. No biliary dilatation. Hepatic cysts. Pancreas: No  inflammation. Stable dilatation of the proximal duct measuring up to 7 mm. Spleen: Normal in size without focal abnormality. Adrenals/Urinary Tract: Adrenal glands are within normal limits. Limited assessment of kidneys due to delayed phase imaging. Post treatment changes at the posterior cortex of left kidney without significant change. No hydronephrosis. The bladder is unremarkable aside from small posterior bladder diverticula. Stomach/Bowel: Fluid distension of the distal stomach, duodenum and jejunum. Fluid-filled distended proximal small bowel measuring up to 4.4 cm. Transition to decompressed small bowel in the left lower quadrant, series 2, image 51, similar location as compared with previous exams. The small bowel distal to the transition point is completely decompressed. Negative appendix. Left abdominal colostomy. Diverticular disease of the left colon. Vascular/Lymphatic: Advanced aortic atherosclerosis. No aneurysm. No suspicious lymph nodes. Reproductive: Uterus unremarkable.  No adnexal mass. Other: Negative for free air or free fluid. Wide neck ventral hernia containing colon and small bowel as well as mesenteric fat, this does not appear to be the source of obstruction. Musculoskeletal: Severe scoliosis and multilevel degenerative changes. Advanced bilateral hip arthritis. Chronic inferior pubic ramus fracture on the  right. IMPRESSION: 1. Findings consistent with high-grade small bowel obstruction with transition point in the left lower quadrant, similar location as compared with previous exams and potentially due to adhesive disease. Mid to distal small bowel is completely decompressed beyond the transition point. 2. Left abdominal colostomy. Diverticular disease of the colon without acute inflammatory process. 3. Wide neck ventral hernia containing colon, small bowel and mesenteric fat but without signs for incarceration or obstruction related to this finding. 4. Stable post treatment changes of  the left kidney. 5. Aortic atherosclerosis. Aortic Atherosclerosis (ICD10-I70.0). Electronically Signed   By: Jasmine Pang M.D.   On: 09/26/2022 19:15   DG Abdomen 1 View  Result Date: 09/26/2022 CLINICAL DATA:  Nausea vomiting EXAM: ABDOMEN - 1 VIEW COMPARISON:  07/07/2020, CT 07/10/2021 FINDINGS: Right-side-up decubitus view obtained. No gross free air. Air distended bowel with fluid levels raising concern for bowel obstruction. IMPRESSION: Air distended bowel with fluid levels raising concern for bowel obstruction. Electronically Signed   By: Jasmine Pang M.D.   On: 09/26/2022 18:00   (Echo, Carotid, EGD, Colonoscopy, ERCP)    Subjective: No complaints  Discharge Exam: Vitals:   09/28/22 1946 09/29/22 0546  BP: 139/67 123/66  Pulse: 78 73  Resp: 18 16  Temp: 98.8 F (37.1 C) 98.2 F (36.8 C)  SpO2: 98% 98%   Vitals:   09/28/22 0436 09/28/22 1308 09/28/22 1946 09/29/22 0546  BP: 112/66 125/83 139/67 123/66  Pulse: 78 87 78 73  Resp: 16 16 18 16   Temp: 98.7 F (37.1 C) 97.6 F (36.4 C) 98.8 F (37.1 C) 98.2 F (36.8 C)  TempSrc: Oral Oral Oral Oral  SpO2: 95% 100% 98% 98%  Weight:      Height:        General: Pt is alert, awake, not in acute distress Cardiovascular: RRR, S1/S2 +, no rubs, no gallops Respiratory: CTA bilaterally, no wheezing, no rhonchi Abdominal: Soft, NT, ND, bowel sounds + Extremities: no edema, no cyanosis    The results of significant diagnostics from this hospitalization (including imaging, microbiology, ancillary and laboratory) are listed below for reference.     Microbiology: No results found for this or any previous visit (from the past 240 hour(s)).   Labs: BNP (last 3 results) No results for input(s): "BNP" in the last 8760 hours. Basic Metabolic Panel: Recent Labs  Lab 09/26/22 1637 09/27/22 0452 09/28/22 0427 09/29/22 0524  NA 138 142 142 141  K 3.7 3.5 4.1 3.8  CL 97* 96* 104 109  CO2 29 31 29 26   GLUCOSE 143* 145* 93  100*  BUN 24* 30* 39* 20  CREATININE 0.96 1.24* 0.91 0.61  CALCIUM 10.2 10.3 8.1* 7.9*  MG  --  2.1 2.0  --   PHOS  --  7.5*  --   --    Liver Function Tests: Recent Labs  Lab 09/26/22 1637  AST 29  ALT 18  ALKPHOS 89  BILITOT 1.1  PROT 8.5*  ALBUMIN 4.5   Recent Labs  Lab 09/26/22 1637  LIPASE 33   No results for input(s): "AMMONIA" in the last 168 hours. CBC: Recent Labs  Lab 09/26/22 1637 09/27/22 0452  WBC 8.4 5.2  HGB 12.9 12.3  HCT 40.7 38.5  MCV 96.7 98.0  PLT 330 299   Cardiac Enzymes: No results for input(s): "CKTOTAL", "CKMB", "CKMBINDEX", "TROPONINI" in the last 168 hours. BNP: Invalid input(s): "POCBNP" CBG: Recent Labs  Lab 09/28/22 1615 09/28/22 1948 09/28/22 2319 09/29/22 0409  09/29/22 0731  GLUCAP 107* 136* 122* 100* 85   D-Dimer No results for input(s): "DDIMER" in the last 72 hours. Hgb A1c No results for input(s): "HGBA1C" in the last 72 hours. Lipid Profile No results for input(s): "CHOL", "HDL", "LDLCALC", "TRIG", "CHOLHDL", "LDLDIRECT" in the last 72 hours. Thyroid function studies No results for input(s): "TSH", "T4TOTAL", "T3FREE", "THYROIDAB" in the last 72 hours.  Invalid input(s): "FREET3" Anemia work up No results for input(s): "VITAMINB12", "FOLATE", "FERRITIN", "TIBC", "IRON", "RETICCTPCT" in the last 72 hours. Urinalysis    Component Value Date/Time   COLORURINE YELLOW 09/27/2022 0422   APPEARANCEUR CLEAR 09/27/2022 0422   LABSPEC >=1.030 09/27/2022 0422   PHURINE 5.0 09/27/2022 0422   GLUCOSEU NEGATIVE 09/27/2022 0422   HGBUR NEGATIVE 09/27/2022 0422   BILIRUBINUR NEGATIVE 09/27/2022 0422   BILIRUBINUR neg 06/06/2015 1201   KETONESUR 20 (A) 09/27/2022 0422   PROTEINUR 30 (A) 09/27/2022 0422   UROBILINOGEN 0.2 06/06/2015 1201   UROBILINOGEN 0.2 11/22/2014 0929   NITRITE NEGATIVE 09/27/2022 0422   LEUKOCYTESUR NEGATIVE 09/27/2022 0422   Sepsis Labs Recent Labs  Lab 09/26/22 1637 09/27/22 0452  WBC 8.4  5.2   Microbiology No results found for this or any previous visit (from the past 240 hour(s)).   SIGNED:   Marinda Elk, MD  [facility] 09/29/2022, 8:35 AM Pager   If 7PM-7AM, please contact night-coverage [email or web address] Password [email or web address]

## 2022-09-29 NOTE — Evaluation (Signed)
Physical Therapy Evaluation Patient Details Name: [name] MRN: [id number] DOB: 24-Aug-1955 Today's Date: 09/29/2022  History of Present Illness  67 y.o. female past medical history significant for blindness, diverticulitis with perforation status post colectomy and colostomy, SBO, hernia repair, rheumatoid arthritis on methotrexate, CVA, Marfan's syndrome and admitted 09/26/22 for SBO.  Clinical Impression  Pt admitted with above diagnosis.  Pt currently with functional limitations due to the deficits listed below (see PT Problem List). Pt will benefit from acute skilled PT to increase their independence and safety with mobility to allow discharge.   Pt assisted with ambulating which was for the first time since her admission.  Pt only able to tolerate approximately 25 feet with RW.  Pt reports feeling very weak.  Pt states she lives alone and daughter assists with tasks such as grocery shopping.       Recommendations for follow up therapy are one component of a multi-disciplinary discharge planning process, led by the attending physician.  Recommendations may be updated based on patient status, additional functional criteria and insurance authorization.  Follow Up Recommendations       Assistance Recommended at Discharge Intermittent Supervision/Assistance  Patient can return home with the following  A little help with walking and/or transfers;A little help with bathing/dressing/bathroom;Assist for transportation;Help with stairs or ramp for entrance;Assistance with cooking/housework    Equipment Recommendations None recommended by PT  Recommendations for Other Services       Functional Status Assessment Patient has had a recent decline in their functional status and demonstrates the ability to make significant improvements in function in a reasonable and predictable amount of time.     Precautions / Restrictions Precautions Precautions: Fall Precaution Comments: legally  blind Restrictions Weight Bearing Restrictions: No      Mobility  Bed Mobility Overal bed mobility: Needs Assistance Bed Mobility: Supine to Sit, Sit to Supine     Supine to sit: Min guard, HOB elevated Sit to supine: Min assist   General bed mobility comments: light assist for LEs onto bed    Transfers Overall transfer level: Needs assistance Equipment used: Rolling walker (2 wheels) Transfers: Sit to/from Stand Sit to Stand: Min assist           General transfer comment: assist to rise, multimodal cues provided as pt legally blind    Ambulation/Gait Ambulation/Gait assistance: Min guard, Min assist Gait Distance (Feet): 25 Feet Assistive device: Rolling walker (2 wheels) Gait Pattern/deviations: Step-through pattern, Decreased stride length, Trunk flexed Gait velocity: decr     General Gait Details: increased bil toe out, kyphoscoliosis so trunk with increased flexion, slow but steady with RW, assist for maneuvering RW mostly due to pt legally blind, pt reports distance limited by weakness  Stairs            Wheelchair Mobility    Modified Rankin (Stroke Patients Only)       Balance Overall balance assessment: Mild deficits observed, not formally tested (pt denies any recent falls)                                           Pertinent Vitals/Pain Pain Assessment Pain Assessment: No/denies pain    Home Living Family/patient expects to be discharged to:: Private residence Living Arrangements: Alone Available Help at Discharge: Family;Available PRN/intermittently Type of Home: Apartment Home Access: Level entry       Home  Layout: One level Home Equipment: Agricultural consultant (2 wheels)      Prior Function Prior Level of Function : Needs assist             Mobility Comments: uses RW at baseline, legally blind so mostly remains at home ADLs Comments: reports daughter does grocery shopping     Hand Dominance         Extremity/Trunk Assessment   Upper Extremity Assessment Upper Extremity Assessment: RUE deficits/detail;LUE deficits/detail RUE Deficits / Details: bil wrist and hand deformities (hx RA         Cervical / Trunk Assessment Cervical / Trunk Assessment: Other exceptions;Kyphotic Cervical / Trunk Exceptions: scoliosis  Communication   Communication: No difficulties  Cognition Arousal/Alertness: Awake/alert Behavior During Therapy: WFL for tasks assessed/performed, Flat affect Overall Cognitive Status: Within Functional Limits for tasks assessed                                          General Comments      Exercises     Assessment/Plan    PT Assessment Patient needs continued PT services  PT Problem List Decreased mobility;Decreased activity tolerance;Decreased strength       PT Treatment Interventions Gait training;DME instruction;Therapeutic exercise;Balance training;Functional mobility training;Therapeutic activities;Patient/family education;Stair training    PT Goals (Current goals can be found in the Care Plan section)  Acute Rehab PT Goals PT Goal Formulation: With patient Time For Goal Achievement: 10/13/22 Potential to Achieve Goals: Good    Frequency Min 3X/week     Co-evaluation               AM-PAC PT "6 Clicks" Mobility  Outcome Measure Help needed turning from your back to your side while in a flat bed without using bedrails?: A Little Help needed moving from lying on your back to sitting on the side of a flat bed without using bedrails?: A Little Help needed moving to and from a bed to a chair (including a wheelchair)?: A Little Help needed standing up from a chair using your arms (e.g., wheelchair or bedside chair)?: A Little Help needed to walk in hospital room?: A Lot Help needed climbing 3-5 steps with a railing? : A Lot 6 Click Score: 16    End of Session Equipment Utilized During Treatment: Gait belt Activity  Tolerance: Patient tolerated treatment well;Patient limited by fatigue Patient left: in bed;with call bell/phone within reach;with bed alarm set   PT Visit Diagnosis: Difficulty in walking, not elsewhere classified (R26.2)    Time: 7408-1448 PT Time Calculation (min) (ACUTE ONLY): 18 min   Charges:   PT Evaluation $PT Eval Low Complexity: 1 Low        Kati PT, DPT Physical Therapist Acute Rehabilitation Services Office: [phone]  Janan Halter [place] 09/29/2022, 12:48 PM

## 2022-09-29 NOTE — Progress Notes (Signed)
Mobility Specialist - Progress Note    09/29/22 1240  Mobility  Activity Ambulated with assistance in hallway  Level of Assistance Minimal assist, patient does 75% or more  Assistive Device Front wheel walker  Distance Ambulated (ft) 25 ft  Activity Response Tolerated well  $Mobility charge 1 Mobility   Pt received in bed and agreeable to ambulate. Sat at EOB and required Min A to stand. No complaints made during session. Pt requested to return to room at EOS and was left sitting up in bed with NT in room and lunch at side.   Arliss Journey Mobility Specialist Acute Rehabilitation Services Phone: [phone] 09/29/22, 12:43 PM

## 2022-10-01 ENCOUNTER — Ambulatory Visit: Payer: Self-pay

## 2022-10-01 ENCOUNTER — Telehealth: Payer: Self-pay

## 2022-10-01 LAB — GLUCOSE, CAPILLARY: Glucose-Capillary: 122 mg/dL — ABNORMAL HIGH (ref 70–99)

## 2022-10-01 NOTE — Chronic Care Management (AMB) (Signed)
   10/01/2022  [name] 03/19/1956 [id number]   Reason for Encounter: Patient is not currently enrolled in the CCM program. CCM status changed to previously enrolled.   Katha Cabal RN Care Manager/Chronic Care Management [phone]

## 2022-10-01 NOTE — Transitions of Care (Post Inpatient/ED Visit) (Signed)
   10/01/2022  Name: [name] MRN: [id number] DOB: 11-11-1955  Today's TOC FU Call Status: Today's TOC FU Call Status:: Successful TOC FU Call Competed TOC FU Call Complete Date: 10/01/22 (Incoming call from patient returning RN CM call.)  Transition Care Management Follow-up Telephone Call Date of Discharge: 09/29/22 Discharge Facility: [facility]) Type of Discharge: Inpatient Admission Primary Inpatient Discharge Diagnosis:: "SBO" How have you been since you were released from the hospital?: Better (pt states she has had no n/v-not needed to take any meds. LBM was yesterday. Appetite fair-normally only eats 2x/day. She is drinking water and Pedialyte to stay hydrated.) Any questions or concerns?: No  Items Reviewed: Did you receive and understand the discharge instructions provided?: Yes Medications obtained and verified?: Yes (Medications Reviewed) Any new allergies since your discharge?: No Dietary orders reviewed?: Yes Type of Diet Ordered:: low salt/heart healthy Do you have support at home?: Yes People in Home: child(ren), adult Name of Support/Comfort Primary Source: Malicia-dtr  Home Care and Equipment/Supplies: Were Home Health Services Ordered?: NA Any new equipment or medical supplies ordered?: NA  Functional Questionnaire: Do you need assistance with bathing/showering or dressing?: Yes Do you need assistance with meal preparation?: Yes Do you need assistance with eating?: No Do you have difficulty maintaining continence: No Do you need assistance with getting out of bed/getting out of a chair/moving?: No Do you have difficulty managing or taking your medications?: No  Follow up appointments reviewed: PCP Follow-up appointment confirmed?: Yes Date of PCP follow-up appointment?: 10/09/22 Follow-up Provider: Dr. Clent Ridges Specialist [facility] Follow-up appointment confirmed?: NA Do you need transportation to your follow-up appointment?: No (pt states daughter  takes her to appts) Do you understand care options if your condition(s) worsen?: Yes-patient verbalized understanding  SDOH Interventions Today    Flowsheet Row Most Recent Value  SDOH Interventions   Food Insecurity Interventions Intervention Not Indicated  Transportation Interventions Intervention Not Indicated      TOC Interventions Today    Flowsheet Row Most Recent Value  TOC Interventions   TOC Interventions Discussed/Reviewed TOC Interventions Discussed, Arranged PCP follow up less than 12 days/Care Guide scheduled      Interventions Today    Flowsheet Row Most Recent Value  General Interventions   General Interventions Discussed/Reviewed General Interventions Discussed, Doctor Visits  Doctor Visits Discussed/Reviewed PCP, Doctor Visits Discussed, Specialist  PCP/Specialist Visits Compliance with follow-up visit  Education Interventions   Education Provided Provided Education  Provided Verbal Education On Nutrition, When to see the doctor, Other  Nutrition Interventions   Nutrition Discussed/Reviewed Nutrition Discussed  Pharmacy Interventions   Pharmacy Dicussed/Reviewed Pharmacy Topics Discussed, Medications and their functions       Alessandra Grout [facility] Care Management Care Management Community Coordinator Direct Phone: [phone] Toll Free: [phone] Fax: [phone]

## 2022-10-01 NOTE — Transitions of Care (Post Inpatient/ED Visit) (Signed)
   10/01/2022  Name: [name] MRN: [id number] DOB: 1955/07/15  Today's TOC FU Call Status: Today's TOC FU Call Status:: Unsuccessul Call (1st Attempt) Unsuccessful Call (1st Attempt) Date: 10/01/22  Attempted to reach the patient regarding the most recent Inpatient/ED visit.  Follow Up Plan: Additional outreach attempts will be made to reach the patient to complete the Transitions of Care (Post Inpatient/ED visit) call.     Antionette Fairy, RN,BSN,CCM [facility] Care Management Care Management Community Coordinator Direct Phone: [phone] Toll Free: [phone] Fax: [phone]

## 2022-10-04 DIAGNOSIS — Z933 Colostomy status: Secondary | ICD-10-CM | POA: Diagnosis not present

## 2022-10-09 ENCOUNTER — Telehealth: Payer: 59 | Admitting: Family Medicine

## 2022-10-10 ENCOUNTER — Telehealth: Payer: Self-pay | Admitting: Family Medicine

## 2022-10-10 DIAGNOSIS — M069 Rheumatoid arthritis, unspecified: Secondary | ICD-10-CM | POA: Diagnosis not present

## 2022-10-10 DIAGNOSIS — R5381 Other malaise: Secondary | ICD-10-CM | POA: Diagnosis not present

## 2022-10-10 DIAGNOSIS — K5720 Diverticulitis of large intestine with perforation and abscess without bleeding: Secondary | ICD-10-CM | POA: Diagnosis not present

## 2022-10-10 DIAGNOSIS — M0549 Rheumatoid myopathy with rheumatoid arthritis of multiple sites: Secondary | ICD-10-CM | POA: Diagnosis not present

## 2022-10-10 NOTE — Telephone Encounter (Signed)
Called patient to schedule Medicare Annual Wellness Visit (AWV). Left message for patient to call back and schedule Medicare Annual Wellness Visit (AWV).  Last date of AWV: 10/16/21  Please schedule an appointment at any time with [facility] BEverly or hannah kim.  If any questions, please contact me at [phone].  Thank you ,  Rudell Cobb AWV direct phone # [phone]

## 2022-10-10 NOTE — Telephone Encounter (Signed)
Contacted Rollene Rotunda to schedule their annual wellness visit. Appointment made for 10/18/22.  Rudell Cobb AWV direct phone # [phone]

## 2022-10-15 DIAGNOSIS — M1A09X0 Idiopathic chronic gout, multiple sites, without tophus (tophi): Secondary | ICD-10-CM | POA: Diagnosis not present

## 2022-10-15 DIAGNOSIS — Z79899 Other long term (current) drug therapy: Secondary | ICD-10-CM | POA: Diagnosis not present

## 2022-10-15 DIAGNOSIS — M0579 Rheumatoid arthritis with rheumatoid factor of multiple sites without organ or systems involvement: Secondary | ICD-10-CM | POA: Diagnosis not present

## 2022-10-15 DIAGNOSIS — M1991 Primary osteoarthritis, unspecified site: Secondary | ICD-10-CM | POA: Diagnosis not present

## 2022-10-15 DIAGNOSIS — R5383 Other fatigue: Secondary | ICD-10-CM | POA: Diagnosis not present

## 2022-10-18 ENCOUNTER — Encounter: Payer: Self-pay | Admitting: Family Medicine

## 2022-10-18 ENCOUNTER — Ambulatory Visit (INDEPENDENT_AMBULATORY_CARE_PROVIDER_SITE_OTHER): Payer: 59 | Admitting: Family Medicine

## 2022-10-18 DIAGNOSIS — Z0000 Encounter for general adult medical examination without abnormal findings: Secondary | ICD-10-CM

## 2022-10-18 NOTE — Patient Instructions (Signed)
I really enjoyed getting to talk with you today! I am available on Tuesdays and Thursdays for virtual visits if you have any questions or concerns, or if I can be of any further assistance.   CHECKLIST FROM ANNUAL WELLNESS VISIT:  -Follow up (please call to schedule if not scheduled after visit):   -yearly for annual wellness visit with primary care office  Here is a list of your preventive care/health maintenance measures and the plan for each if any are due:  PLAN For any measures below that may be due:   Health Maintenance  Topic Date Due   Hepatitis C Screening  Never done   Zoster Vaccines- Shingrix (1 of 2) 08/23/2023 (Originally 03/14/1975)   INFLUENZA VACCINE  01/24/2023   MAMMOGRAM  06/07/2023   Medicare Annual Wellness (AWV)  10/18/2023   COLONOSCOPY (Pts 45-35yrs Insurance coverage will need to be confirmed)  11/07/2023   Pneumonia Vaccine 65+ Years old  Completed   DEXA SCAN  Completed   HPV VACCINES  Aged Out   DTaP/Tdap/Td  Discontinued   COVID-19 Vaccine  Discontinued    -See a dentist at least yearly  -Get your eyes checked and then per your eye specialist's recommendations  -Other issues addressed today:   -I have included below further information regarding a healthy whole foods based diet, physical activity guidelines for adults, stress management and opportunities for social connections. I hope you find this information useful.   -----------------------------------------------------------------------------------------------------------------------------------------------------------------------------------------------------------------------------------------------------------  NUTRITION: -eat real food: lots of colorful vegetables (half the plate) and fruits -5-7 servings of vegetables and fruits per day (fresh or steamed is best), exp. 2 servings of vegetables with lunch and dinner and 2 servings of fruit per day. Berries and greens such as kale and  collards are great choices.  -consume on a regular basis: whole grains (make sure first ingredient on label contains the word "whole"), fresh fruits, fish, nuts, seeds, healthy oils (such as olive oil, avocado oil, grape seed oil) -may eat small amounts of dairy and lean meat on occasion, but avoid processed meats such as ham, bacon, lunch meat, etc. -drink water -try to avoid fast food and pre-packaged foods, processed meat -most experts advise limiting sodium to <  per day, should limit further is any chronic conditions such as high blood pressure, heart disease, diabetes, etc. The American Heart Association advised that <  is is ideal -try to avoid foods that contain any ingredients with names you do not recognize  -try to avoid sugar/sweets (except for the natural sugar that occurs in fresh fruit) -try to avoid sweet drinks -try to avoid white rice, white bread, pasta (unless whole grain), white or yellow potatoes  EXERCISE GUIDELINES FOR ADULTS: -if you wish to increase your physical activity, do so gradually and with the approval of your doctor -STOP and seek medical care immediately if you have any chest pain, chest discomfort or trouble breathing when starting or increasing exercise  -move and stretch your body, legs, feet and arms when sitting for long periods -Physical activity guidelines for optimal health in adults: -least 150 minutes per week of aerobic exercise (can talk, but not sing) once approved by your doctor, 20-30 minutes of sustained activity or two 10 minute episodes of sustained activity every day.  -resistance training at least 2 days per week if approved by your doctor -balance exercises 3+ days per week:   Stand somewhere where you have something sturdy to hold onto if you lose balance.  1) lift up on toes, start with 5x per day and work up to 20x   2) stand and lift on leg straight out to the side so that foot is a few inches of the floor, start with 5x  each side and work up to 20x each side   3) stand on one foot, start with 5 seconds each side and work up to 20 seconds on each side  If you need ideas or help with getting more active:  -Silver sneakers https://tools.silversneakers.com  -Walk with a Doc: http://www.duncan-williams.com/  -try to include resistance (weight lifting/strength building) and balance exercises twice per week: or the following link for ideas: http://castillo-powell.com/  BuyDucts.dk  STRESS MANAGEMENT: -can try meditating, or just sitting quietly with deep breathing while intentionally relaxing all parts of your body for 5 minutes daily -if you need further help with stress, anxiety or depression please follow up with your primary doctor or contact the wonderful folks at [facility]: [phone]  SOCIAL CONNECTIONS: -options in [place] if you wish to engage in more social and exercise related activities:  -Silver sneakers https://tools.silversneakers.com  -Walk with a Doc: http://www.duncan-williams.com/  -Check out the [facility] Active Adults 50+ section on the [place] of [employer] (hiking clubs, book clubs, cards and games, chess, exercise classes, aquatic classes and much more) - see the website for details: https://www.-[place].gov/departments/parks-recreation/active-adults50  -YouTube has lots of exercise videos for different ages and abilities as well  -[facility] (a variety of indoor and outdoor inperson activities for adults). [phone]. [address]  -Virtual Online Classes (a variety of topics): see seniorplanet.org or call [phone]  -consider volunteering at a school, hospice center, church, senior center or elsewhere         ADVANCED HEALTHCARE DIRECTIVES:  Everyone should have advanced health care directives in place. This is so that you get the  care you want, should you ever be in a situation where you are unable to make your own medical decisions.   From the [place] Advanced Directive Website: "Advance Health Care Directives are legal documents in which you give written instructions about your health care if, in the future, you cannot speak for yourself.   A health care power of attorney allows you to name a person you trust to make your health care decisions if you cannot make them yourself. A declaration of a desire for a natural death (or living will) is document, which states that you desire not to have your life prolonged by extraordinary measures if you have a terminal or incurable illness or if you are in a vegetative state. An advance instruction for mental health treatment makes a declaration of instructions, information and preferences regarding your mental health treatment. It also states that you are aware that the advance instruction authorizes a mental health treatment provider to act according to your wishes. It may also outline your consent or refusal of mental health treatment. A declaration of an anatomical gift allows anyone over the age of 3 to make a gift by will, organ donor card or other document."   Please see the following website or an elder law attorney for forms, FAQs and for completion of advanced directives: [place] Health Care Directives Advance Health Care Directives (http://guzman.com/)  Or copy and paste the following to your web browser: PoshChat.fi

## 2022-10-18 NOTE — Progress Notes (Signed)
PATIENT CHECK-IN and HEALTH RISK ASSESSMENT QUESTIONNAIRE:  -completed by phone/video for upcoming Medicare Preventive Visit  Pre-Visit Check-in: 1)Vitals (height, wt, BP, etc) - record in vitals section for visit on day of visit 2)Review and Update Medications, Allergies PMH, Surgeries, Social history in Epic 3)Hospitalizations in the last year with date/reason? Yes, Bowel   4)Review and Update Care Team (patient's specialists) in Epic 5) Complete PHQ9 in Epic  6) Complete Fall Screening in Epic 7)Review all Health Maintenance Due and order under PCP if not done.  8)Medicare Wellness Questionnaire: Answer theses question about your habits: Do you drink alcohol? No  Have you ever smoked?Yes Quit date if applicable? 2014  How many packs a day do/did you smoke? 1 Do you use smokeless tobacco?No Do you use an illicit drugs?No Do you exercises? Yes IF so, what type and how many days/minutes per week?Everyday - 30 minutes at home, chair exercises and ROM Are you sexually active? No  Typical breakfast: N/A  Typical lunch: Varies  Typical dinner Varies Typical snacks: Varies   Beverages: Coca Cola, Coffee  Answer theses question about you: Can you perform most household chores?No Do you find it hard to follow a conversation in a noisy room?Yes - hearing aide - working well Do you often ask people to speak up or repeat themselves? Yes Do you feel that you have a problem with memory?No Do you balance your checkbook and or bank acounts?Yes Do you feel safe at home?Yes Last dentist visit? 1 week ago Do you need assistance with any of the following: Please note if so - see below  Driving? Yes  Feeding yourself? No  Getting from bed to chair? No  Getting to the toilet? No  Bathing or showering? Yes  Dressing yourself? Yes  Managing money? No  Climbing a flight of stairs Yes   Preparing meals?Yes  Do you have Advanced Directives in place (Living Will, Healthcare Power or Attorney)?  No   Last eye Exam and location?Unknown   Do you currently use prescribed or non-prescribed narcotic or opioid pain medications?Yes  Do you have a history or close family history of breast, ovarian, tubal or peritoneal cancer or a family member with BRCA (breast cancer susceptibility 1 and 2) gene mutations? no  Nurse/Assistant Credentials/time stamp: MG 3:07 PM   ----------------------------------------------------------------------------------------------------------------------------------------------------------------------------------------------------------------------   MEDICARE ANNUAL PREVENTIVE VISIT WITH PROVIDER: (Welcome to [employer], initial annual wellness or annual wellness exam)  Virtual Visit via Phone Note  I connected with Rollene Rotunda on 10/18/22 by phone and verified that I am speaking with the correct person using two identifiers.  Location patient: home Location provider:work or home office Persons participating in the virtual visit: patient, provider  Concerns and/or follow up today: reports doing fine today. Reports is   See HM section in Epic for other details of completed HM.    ROS: negative for report of fevers, unintentional weight loss, vision changes, vision loss, hearing loss or change, chest pain, sob, hemoptysis, melena, hematochezia, hematuria, falls, bleeding or bruising, thoughts of suicide or self harm, memory loss  Patient-completed extensive health risk assessment - reviewed and discussed with the patient: See Health Risk Assessment completed with patient prior to the visit either above or in recent phone note. This was reviewed in detailed with the patient today and appropriate recommendations, orders and referrals were placed as needed per Summary below and patient instructions.   Review of Medical History: -PMH, PSH, Family History and current specialty and care providers reviewed  and updated and listed below   Patient Care  Team: Nelwyn Salisbury, MD as PCP - General Katrinka Blazing Barry Dienes, MD (Inactive) as PCP - Cardiology (Cardiology) Iva Boop, MD as Consulting Physician (Gastroenterology) Zenovia Jordan, MD as Consulting Physician (Rheumatology) Verner Chol, [facility] (Inactive) as Pharmacist (Pharmacist) Romualdo Bolk, MD as Consulting Physician (Obstetrics and Gynecology)   Past Medical History:  Diagnosis Date   Atrial flutter    CHF (congestive heart failure)    Chronic gout 2008   COVID-19 virus infection 07/04/2020   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 11/28/2012 01/05/2013   Diverticulitis of large intestine with perforation    Dysrhythmia    Fibroid    Hearing aid worn    pt wears bilateral hearing aids   Hernia 09/2006   Hx of adenomatous polyp of colon 11/13/2016   Hypertension    Legally blind    since pt was a teenager   Marfan's syndrome affecting skin    with scolosis   Osteoporosis    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   bilateral     Past Surgical History:  Procedure Laterality Date   ABLATION  01/04/2014   atrial flutter ablation (2 circuits) by Dr Ladona Ridgel   ATRIAL FLUTTER ABLATION N/A 01/04/2014   Procedure: ATRIAL FLUTTER ABLATION;  Surgeon: Marinus Maw, MD;  Location: [facility] CATH LAB;  Service: Cardiovascular;  Laterality: N/A;   BUNIONECTOMY Bilateral 06/2008   COLECTOMY WITH COLOSTOMY CREATION/HARTMANN PROCEDURE  11/27/2012   colectomy   COLOSTOMY  11/27/2012   HERNIA REPAIR     IR RADIOLOGIST EVAL & MGMT  10/01/2017   IR RADIOLOGIST EVAL & MGMT  11/21/2017   IR RADIOLOGIST EVAL & MGMT  02/13/2018   IR RADIOLOGIST EVAL & MGMT  01/08/2019    IR RADIOLOGIST EVAL & MGMT  12/29/2019   IR RADIOLOGIST EVAL & MGMT  01/31/2021   IR RADIOLOGIST EVAL & MGMT  12/20/2021   LAPAROSCOPIC ABDOMINAL EXPLORATION N/A 01/06/2013   Procedure: LAPAROSCOPIC ABDOMINAL EXPLORATION;  Surgeon: Ardeth Sportsman, MD;  Location: [facility] ORS;  Service: General;  Laterality: N/A;   LAPAROSCOPIC LYSIS OF ADHESIONS N/A 01/06/2013   Procedure: LAPAROSCOPIC LYSIS OF ADHESIONS/ INTEROTOMY REPAIR;  Surgeon: Ardeth Sportsman, MD;  Location: [facility];  Service: General;  Laterality: N/A;   LAPAROTOMY N/A 11/27/2012   Procedure: EXPLORATORY LAPAROTOMY SIGMOID COLECTOMY, COLOSTOMY;  Surgeon: Lodema Pilot, DO;  Location: [facility];  Service: General;  Laterality: N/A;   RADIOLOGY WITH ANESTHESIA Left 10/23/2017   Procedure: CT RENAL CRYO AND BIOPSY;  Surgeon: Irish Lack, MD;  Location: [facility];  Service: Radiology;  Laterality: Left;   TOTAL KNEE ARTHROPLASTY Bilateral 11/2006    Social History   Socioeconomic History   Marital status: Single    Spouse name: Not on file   Number of children: 1   Years of education: Not on file   Highest education level: Not on file  Occupational History   Not on file  Tobacco Use   Smoking status: Former    Packs/day: 1    Types: Cigarettes    Quit date: 11/27/2012  Years since quitting: 9.8   Smokeless tobacco: Never  Vaping Use   Vaping Use: Former  Substance and Sexual Activity   Alcohol use: No    Alcohol/week: 0.0 standard drinks of alcohol    Comment: recovering alcoholic sober since 1997   Drug use: Yes    Types: Hydrocodone   Sexual activity: Not Currently    Partners: Male    Birth control/protection: None  Other Topics Concern   Not on file  Social History Narrative   Not on file   Social Determinants of Health   Financial Resource Strain: Low Risk  (10/16/2021)   Overall Financial Resource Strain (CARDIA)    Difficulty of Paying Living Expenses: Not hard at all  Food Insecurity: No Food Insecurity  (10/01/2022)   Hunger Vital Sign    Worried About Running Out of Food in the Last Year: Never true    Ran Out of Food in the Last Year: Never true  Transportation Needs: No Transportation Needs (10/01/2022)   PRAPARE - Administrator, Civil Service (Medical): No    Lack of Transportation (Non-Medical): No  Physical Activity: Inactive (10/16/2021)   Exercise Vital Sign    Days of Exercise per Week: 0 days    Minutes of Exercise per Session: 0 min  Stress: Stress Concern Present (10/16/2021)   [employer] of Occupational Health - Occupational Stress Questionnaire    Feeling of Stress : To some extent  Social Connections: Unknown (10/05/2020)   Social Connection and Isolation Panel [NHANES]    Frequency of Communication with Friends and Family: Not on file    Frequency of Social Gatherings with Friends and Family: Once a week    Attends Religious Services: Never    Database administrator or Organizations: No    Attends Banker Meetings: Never    Marital Status: Never married  Intimate Partner Violence: Not At Risk (09/26/2022)   Humiliation, Afraid, Rape, and Kick questionnaire    Fear of Current or Ex-Partner: No    Emotionally Abused: No    Physically Abused: No    Sexually Abused: No    Family History  Problem Relation Age of Onset   Heart attack Neg Hx    Colon cancer Neg Hx    Osteoporosis Neg Hx     Current Outpatient Medications on File Prior to Visit  Medication Sig Dispense Refill   Abatacept 125 MG/ML SOSY Inject 125 mg into the skin once a week. Has on Sunday     albuterol (VENTOLIN HFA) 108 (90 Base) MCG/ACT inhaler INHALE 1 PUFF INTO THE LUNGS EVERY 4 HOURS AS NEEDED FOR WHEEZING OR SHORTNESS OF BREATH (Patient taking differently: Inhale 1 puff into the lungs every 4 (four) hours as needed for wheezing or shortness of breath.) 8.5 g 3   allopurinol (ZYLOPRIM) 100 MG tablet TAKE 1 TABLET BY MOUTH EVERY DAY (Patient taking differently: Take 100  mg by mouth daily.) 90 tablet 0   aspirin EC 81 MG tablet Take 1 tablet (81 mg total) by mouth daily.     CALCIUM PO Take 1 tablet by mouth 2 (two) times daily. 600 mg of calcium with vitamin D     cetirizine (ZYRTEC) 10 MG tablet Take 1 tablet (10 mg total) by mouth daily. 90 tablet 1   Cholecalciferol (VITAMIN D) 2000 units CAPS Take 2,000 Units by mouth daily.     ciprofloxacin-dexamethasone (CIPRODEX) OTIC suspension Place 4 drops into both ears 2 (two)  times daily. (Patient taking differently: Place 4 drops into both ears 2 (two) times daily as needed (Infection/Irritation).) 7.5 mL 5   clobetasol ointment (TEMOVATE) 0.05 % Apply 1 Application topically 2 (two) times daily. (Patient taking differently: Apply 1 Application topically 2 (two) times daily as needed (Dermatitis).) 60 g 5   diclofenac sodium (VOLTAREN) 1 % GEL Apply 1 application topically 4 (four) times daily as needed (arthritis).     docusate sodium (COLACE) 100 MG capsule Take 100 mg by mouth 2 (two) times daily.     esomeprazole (NEXIUM) 40 MG capsule TAKE 1 CAPSULE(40 MG) BY MOUTH DAILY IN THE AFTERNOON (Patient taking differently: Take 40 mg by mouth daily in the afternoon.) 90 capsule 0   folic acid (FOLVITE) 400 MCG tablet Take 400 mcg by mouth daily.     HYDROcodone-acetaminophen (NORCO) 10-325 MG tablet Take 1 tablet by mouth every 6 (six) hours as needed for moderate pain. 120 tablet 0   ketoconazole (NIZORAL) 2 % shampoo Apply 1 application topically 2 (two) times a week.     Methotrexate Sodium (METHOTREXATE, PF,) 50 MG/2ML injection Inject 0.5 mLs into the muscle once a week. Sunday  0   metoprolol succinate (TOPROL-XL) 25 MG 24 hr tablet Take 1 tablet (25 mg total) by mouth daily. 90 tablet 3   Multiple Vitamin (MULTIVITAMIN WITH MINERALS) TABS tablet Take 1 tablet by mouth daily.     mupirocin ointment (BACTROBAN) 2 % Apply 1 Application topically 2 (two) times daily. 30 g 2   ondansetron (ZOFRAN-ODT) 8 MG  disintegrating tablet Take 1 tablet (8 mg total) by mouth every 8 (eight) hours as needed for nausea or vomiting. 60 tablet 5   PERIDEX 0.12 % solution Use as directed 15 mLs in the mouth or throat 2 (two) times daily as needed (gingivitis).     polyethylene glycol (MIRALAX / GLYCOLAX) 17 g packet Take 17 g by mouth daily as needed for mild constipation. 14 each 1   predniSONE (DELTASONE) 5 MG tablet Take 5-10 mg by mouth daily as needed (Inflammation).     tiZANidine (ZANAFLEX) 4 MG tablet TAKE 1 TABLET(4 MG) BY MOUTH TWICE DAILY (Patient taking differently: Take 4 mg by mouth 2 (two) times daily.) 90 tablet 5   traMADol (ULTRAM) 50 MG tablet TAKE 1 TO 2 TABLETS(50 TO 100 MG) BY MOUTH EVERY 6 HOURS AS NEEDED (Patient taking differently: Take 50 mg by mouth 2 (two) times daily.) 180 tablet 1   triamcinolone lotion (KENALOG) 0.1 % Apply topically 2 (two) times daily. 60 mL 5   vitamin B-12 (CYANOCOBALAMIN) 500 MCG tablet Take 500 mcg by mouth daily.     vitamin C (ASCORBIC ACID) 500 MG tablet Take 500 mg by mouth daily.      temazepam (RESTORIL) 30 MG capsule Take 30 mg by mouth at bedtime as needed for sleep. (Patient not taking: Reported on 10/18/2022)     No current facility-administered medications on file prior to visit.    Allergies  Allergen Reactions   Spinach Itching, Rash and Other (See Comments)    Welts.       Physical Exam There were no vitals filed for this visit. Estimated body mass index is 22.81 kg/m as calculated from the following:   Height as of 09/26/22: 5\' 8"  (1.727 m).   Weight as of 09/26/22: 150 lb (68 kg).  EKG (optional): deferred due to virtual visit  GENERAL: alert, oriented, no acute distress detected, full vision exam deferred  due to pandemic and/or virtual encounter  PSYCH/NEURO: pleasant and cooperative, no obvious depression or anxiety, speech and thought processing grossly intact, Cognitive function grossly intact  Flowsheet Row Office Visit from  [facility]/18/2022 in [facility] [place]  PHQ-9 Total Score 0           10/18/2022    3:01 PM 01/01/2022   11:25 AM 10/16/2021    3:13 PM 08/18/2021    1:48 PM 12/20/2020   10:14 AM  Depression screen PHQ 2/9  Decreased Interest 0 0 0 0 0  Down, Depressed, Hopeless 0 0 0 0 0  PHQ - 2 Score 0 0 0 0 0  Altered sleeping 0 1     Tired, decreased energy 0 0     Change in appetite 0 0     Feeling bad or failure about yourself  0 0     Trouble concentrating 0 0     Moving slowly or fidgety/restless 0 0     Suicidal thoughts 0 0     PHQ-9 Score 0 1     Difficult doing work/chores Not difficult at all Not difficult at all          07/25/2021    1:13 PM 08/18/2021    1:46 PM 10/16/2021    3:13 PM 01/01/2022   11:24 AM 10/18/2022    3:02 PM  Fall Risk  Falls in the past year? 0 0 0 0 0  Was there an injury with Fall? 0 0 0 0 0  Fall Risk Category Calculator 0 0 0 0 0  Fall Risk Category (Retired) Low Low Low Low   (RETIRED) Patient Fall Risk Level High fall risk High fall risk High fall risk Low fall risk   Patient at Risk for Falls Due to  Impaired balance/gait;Impaired mobility Impaired balance/gait;Impaired vision;Impaired mobility No Fall Risks No Fall Risks  Fall risk Follow up  Education provided;Falls prevention discussed Falls evaluation completed;Education provided;Falls prevention discussed Falls evaluation completed Falls evaluation completed     SUMMARY AND PLAN:  Encounter for Medicare annual wellness exam   Discussed applicable health maintenance/preventive health measures and advised and referred or ordered per patient preferences: -discussed measures due -she declined shingrix and lung ca screening -she reports had bone density test last year and sees specialist for management Health Maintenance  Topic Date Due   Hepatitis C Screening  Never done   Zoster Vaccines- Shingrix (1 of 2) 08/23/2023 (Originally 03/14/1975)   INFLUENZA VACCINE   01/24/2023   MAMMOGRAM  06/07/2023   Medicare Annual Wellness (AWV)  10/18/2023   COLONOSCOPY (Pts 45-17yrs Insurance coverage will need to be confirmed)  11/07/2023   Pneumonia Vaccine 82+ Years old  Completed   DEXA SCAN  Completed   HPV VACCINES  Aged Out   DTaP/Tdap/Td  Discontinued   COVID-19 Vaccine  Discontinued     Education and counseling on the following was provided based on the above review of health and a plan/checklist for the patient, along with additional information discussed, was provided for the patient in the patient instructions :  -Advised on importance of completing advanced directives, discussed options for completing and provided information in patient instructions as well - she reports she has all of the info and paper, plans to do -Provided  safe balance exercises that can be done at home to improve balance and discussed exercise guidelines for adults with include balance exercises at least 3 days per week.  -Advised  and counseled on a healthy lifestyle - including the importance of a healthy diet, regular physical activity, social connections and stress management. -Reviewed patient's current diet. Advised and counseled on a whole foods based healthy diet. A summary of a healthy diet was provided in the Patient Instructions.  -reviewed patient's current physical activity level and discussed exercise guidelines for adults. Discussed community resources and ideas for safe exercise at home to assist in meeting exercise guideline recommendations in a safe and healthy way.  -Advise yearly dental visits at minimum and regular eye exams   Follow up: see patient instructions     Patient Instructions  I really enjoyed getting to talk with you today! I am available on Tuesdays and Thursdays for virtual visits if you have any questions or concerns, or if I can be of any further assistance.   CHECKLIST FROM ANNUAL WELLNESS VISIT:  -Follow up (please call to schedule if  not scheduled after visit):   -yearly for annual wellness visit with primary care office  Here is a list of your preventive care/health maintenance measures and the plan for each if any are due:  PLAN For any measures below that may be due:   Health Maintenance  Topic Date Due   Hepatitis C Screening  Never done   Zoster Vaccines- Shingrix (1 of 2) 08/23/2023 (Originally 03/14/1975)   INFLUENZA VACCINE  01/24/2023   MAMMOGRAM  06/07/2023   Medicare Annual Wellness (AWV)  10/18/2023   COLONOSCOPY (Pts 45-19yrs Insurance coverage will need to be confirmed)  11/07/2023   Pneumonia Vaccine 19+ Years old  Completed   DEXA SCAN  Completed   HPV VACCINES  Aged Out   DTaP/Tdap/Td  Discontinued   COVID-19 Vaccine  Discontinued    -See a dentist at least yearly  -Get your eyes checked and then per your eye specialist's recommendations  -Other issues addressed today:   -I have included below further information regarding a healthy whole foods based diet, physical activity guidelines for adults, stress management and opportunities for social connections. I hope you find this information useful.   -----------------------------------------------------------------------------------------------------------------------------------------------------------------------------------------------------------------------------------------------------------  NUTRITION: -eat real food: lots of colorful vegetables (half the plate) and fruits -5-7 servings of vegetables and fruits per day (fresh or steamed is best), exp. 2 servings of vegetables with lunch and dinner and 2 servings of fruit per day. Berries and greens such as kale and collards are great choices.  -consume on a regular basis: whole grains (make sure first ingredient on label contains the word "whole"), fresh fruits, fish, nuts, seeds, healthy oils (such as olive oil, avocado oil, grape seed oil) -may eat small amounts of dairy and lean meat  on occasion, but avoid processed meats such as ham, bacon, lunch meat, etc. -drink water -try to avoid fast food and pre-packaged foods, processed meat -most experts advise limiting sodium to < 2300mg  per day, should limit further is any chronic conditions such as high blood pressure, heart disease, diabetes, etc. The American Heart Association advised that < 1500mg  is is ideal -try to avoid foods that contain any ingredients with names you do not recognize  -try to avoid sugar/sweets (except for the natural sugar that occurs in fresh fruit) -try to avoid sweet drinks -try to avoid white rice, white bread, pasta (unless whole grain), white or yellow potatoes  EXERCISE GUIDELINES FOR ADULTS: -if you wish to increase your physical activity, do so gradually and with the approval of your doctor -STOP and seek medical care immediately if  you have any chest pain, chest discomfort or trouble breathing when starting or increasing exercise  -move and stretch your body, legs, feet and arms when sitting for long periods -Physical activity guidelines for optimal health in adults: -least 150 minutes per week of aerobic exercise (can talk, but not sing) once approved by your doctor, 20-30 minutes of sustained activity or two 10 minute episodes of sustained activity every day.  -resistance training at least 2 days per week if approved by your doctor -balance exercises 3+ days per week:   Stand somewhere where you have something sturdy to hold onto if you lose balance.    1) lift up on toes, start with 5x per day and work up to 20x   2) stand and lift on leg straight out to the side so that foot is a few inches of the floor, start with 5x each side and work up to 20x each side   3) stand on one foot, start with 5 seconds each side and work up to 20 seconds on each side  If you need ideas or help with getting more active:  -Silver sneakers https://tools.silversneakers.com  -Walk with a  Doc: http://www.duncan-williams.com/  -try to include resistance (weight lifting/strength building) and balance exercises twice per week: or the following link for ideas: http://castillo-powell.com/  BuyDucts.dk  STRESS MANAGEMENT: -can try meditating, or just sitting quietly with deep breathing while intentionally relaxing all parts of your body for 5 minutes daily -if you need further help with stress, anxiety or depression please follow up with your primary doctor or contact the wonderful folks at [facility]: [phone]  SOCIAL CONNECTIONS: -options in [place] if you wish to engage in more social and exercise related activities:  -Silver sneakers https://tools.silversneakers.com  -Walk with a Doc: http://www.duncan-williams.com/  -Check out the [facility] Active Adults 50+ section on the [place] of [employer] (hiking clubs, book clubs, cards and games, chess, exercise classes, aquatic classes and much more) - see the website for details: https://www.[place].gov/departments/parks-recreation/active-adults50  -YouTube has lots of exercise videos for different ages and abilities as well  -[facility] (a variety of indoor and outdoor inperson activities for adults). 7470815056. [address]  -Virtual Online Classes (a variety of topics): see seniorplanet.org or call 9808164105  -consider volunteering at a school, hospice center, church, senior center or elsewhere         ADVANCED HEALTHCARE DIRECTIVES:  Everyone should have advanced health care directives in place. This is so that you get the care you want, should you ever be in a situation where you are unable to make your own medical decisions.   From the [place] Advanced Directive Website: "Advance Health Care Directives are legal documents in which you give written instructions about your  health care if, in the future, you cannot speak for yourself.   A health care power of attorney allows you to name a person you trust to make your health care decisions if you cannot make them yourself. A declaration of a desire for a natural death (or living will) is document, which states that you desire not to have your life prolonged by extraordinary measures if you have a terminal or incurable illness or if you are in a vegetative state. An advance instruction for mental health treatment makes a declaration of instructions, information and preferences regarding your mental health treatment. It also states that you are aware that the advance instruction authorizes a mental health treatment provider to act according to your  wishes. It may also outline your consent or refusal of mental health treatment. A declaration of an anatomical gift allows anyone over the age of 56 to make a gift by will, organ donor card or other document."   Please see the following website or an elder law attorney for forms, FAQs and for completion of advanced directives: [place] TEFL teacher Health Care Directives [facility] Directives ([email or web address]  Or copy and paste the following to your web browser: [email or web address]    Terressa Koyanagi, DO

## 2022-10-22 ENCOUNTER — Telehealth: Payer: 59 | Admitting: Family Medicine

## 2022-10-22 ENCOUNTER — Encounter: Payer: Self-pay | Admitting: Family Medicine

## 2022-10-22 ENCOUNTER — Telehealth (INDEPENDENT_AMBULATORY_CARE_PROVIDER_SITE_OTHER): Payer: 59 | Admitting: Family Medicine

## 2022-10-22 DIAGNOSIS — M069 Rheumatoid arthritis, unspecified: Secondary | ICD-10-CM

## 2022-10-22 DIAGNOSIS — F1190 Opioid use, unspecified, uncomplicated: Secondary | ICD-10-CM

## 2022-10-22 MED ORDER — HYDROCODONE-ACETAMINOPHEN 10-325 MG PO TABS: 1.0000 | ORAL_TABLET | Freq: Four times a day (QID) | ORAL | 0 refills | Status: DC | PRN

## 2022-10-22 MED ORDER — MEGESTROL ACETATE 40 MG PO TABS: 40.0000 mg | ORAL_TABLET | Freq: Every day | ORAL | 3 refills | Status: DC

## 2022-10-22 NOTE — Progress Notes (Signed)
Subjective:    Patient ID: [name], female    DOB: 04-01-1956, 67 y.o.   MRN: [id number]  HPI Virtual Visit via Video Note  I connected with the patient on 10/22/22 at  1:15 PM EDT by a video enabled telemedicine application and verified that I am speaking with the correct person using two identifiers.  Location patient: home Location provider:work or home office Persons participating in the virtual visit: patient, provider  I discussed the limitations of evaluation and management by telemedicine and the availability of in person appointments. The patient expressed understanding and agreed to proceed.   HPI: Here for pain management. She is doing about as well as usual.    ROS: See pertinent positives and negatives per HPI.  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   bilateral     Past Surgical History:  Procedure Laterality Date   ABLATION  01/04/2014   atrial flutter ablation (2 circuits) by Dr Ladona Ridgel   ATRIAL FLUTTER ABLATION N/A 01/04/2014   Procedure: ATRIAL FLUTTER  ABLATION;  Surgeon: Marinus Maw, MD;  Location: [facility] CATH LAB;  Service: Cardiovascular;  Laterality: N/A;   BUNIONECTOMY Bilateral 06/2008   COLECTOMY WITH COLOSTOMY CREATION/HARTMANN PROCEDURE  11/27/2012   colectomy   COLOSTOMY  11/27/2012   HERNIA REPAIR     IR RADIOLOGIST EVAL & MGMT  10/01/2017   IR RADIOLOGIST EVAL & MGMT  11/21/2017   IR RADIOLOGIST EVAL & MGMT  02/13/2018   IR RADIOLOGIST EVAL & MGMT  01/08/2019   IR RADIOLOGIST EVAL & MGMT  12/29/2019   IR RADIOLOGIST EVAL & MGMT  01/31/2021   IR RADIOLOGIST EVAL & MGMT  12/20/2021   LAPAROSCOPIC ABDOMINAL EXPLORATION N/A 01/06/2013   Procedure: LAPAROSCOPIC ABDOMINAL EXPLORATION;  Surgeon: Ardeth Sportsman, MD;  Location: [facility];  Service: General;  Laterality: N/A;   LAPAROSCOPIC LYSIS OF ADHESIONS N/A 01/06/2013   Procedure: LAPAROSCOPIC LYSIS OF ADHESIONS/ INTEROTOMY REPAIR;  Surgeon: Ardeth Sportsman, MD;  Location: [facility];  Service: General;  Laterality: N/A;   LAPAROTOMY N/A 11/27/2012   Procedure: EXPLORATORY LAPAROTOMY SIGMOID COLECTOMY, COLOSTOMY;  Surgeon: Lodema Pilot, DO;  Location: [facility];  Service: General;  Laterality: N/A;   RADIOLOGY WITH ANESTHESIA Left 10/23/2017   Procedure: CT RENAL CRYO AND BIOPSY;  Surgeon: Irish Lack, MD;  Location: [facility];  Service: Radiology;  Laterality: Left;   TOTAL KNEE ARTHROPLASTY Bilateral 11/2006  Family History  Problem Relation Age of Onset   Heart attack Neg Hx    Colon cancer Neg Hx    Osteoporosis Neg Hx      Current Outpatient Medications:    Abatacept 125 MG/ML SOSY, Inject 125 mg into the skin once a week. Has on Sunday, Disp: , Rfl:    albuterol (VENTOLIN HFA) 108 (90 Base) MCG/ACT inhaler, INHALE 1 PUFF INTO THE LUNGS EVERY 4 HOURS AS NEEDED FOR WHEEZING OR SHORTNESS OF BREATH (Patient taking differently: Inhale 1 puff into the lungs every 4 (four) hours as needed for wheezing or shortness of breath.), Disp: 8.5 g, Rfl: 3   allopurinol (ZYLOPRIM) 100 MG  tablet, TAKE 1 TABLET BY MOUTH EVERY DAY (Patient taking differently: Take 100 mg by mouth daily.), Disp: 90 tablet, Rfl: 0   aspirin EC 81 MG tablet, Take 1 tablet (81 mg total) by mouth daily., Disp: , Rfl:    CALCIUM PO, Take 1 tablet by mouth 2 (two) times daily. 600 mg of calcium with vitamin D, Disp: , Rfl:    cetirizine (ZYRTEC) 10 MG tablet, Take 1 tablet (10 mg total) by mouth daily., Disp: 90 tablet, Rfl: 1   Cholecalciferol (VITAMIN D) 2000 units CAPS, Take 2,000 Units by mouth daily., Disp: , Rfl:    ciprofloxacin-dexamethasone (CIPRODEX) OTIC suspension, Place 4 drops into both ears 2 (two) times daily. (Patient taking differently: Place 4 drops into both ears 2 (two) times daily as needed (Infection/Irritation).), Disp: 7.5 mL, Rfl: 5   clobetasol ointment (TEMOVATE) 0.05 %, Apply 1 Application topically 2 (two) times daily. (Patient taking differently: Apply 1 Application topically 2 (two) times daily as needed (Dermatitis).), Disp: 60 g, Rfl: 5   diclofenac sodium (VOLTAREN) 1 % GEL, Apply 1 application topically 4 (four) times daily as needed (arthritis)., Disp: , Rfl:    docusate sodium (COLACE) 100 MG capsule, Take 100 mg by mouth 2 (two) times daily., Disp: , Rfl:    esomeprazole (NEXIUM) 40 MG capsule, TAKE 1 CAPSULE(40 MG) BY MOUTH DAILY IN THE AFTERNOON (Patient taking differently: Take 40 mg by mouth daily in the afternoon.), Disp: 90 capsule, Rfl: 0   folic acid (FOLVITE) 400 MCG tablet, Take 400 mcg by mouth daily., Disp: , Rfl:    ketoconazole (NIZORAL) 2 % shampoo, Apply 1 application topically 2 (two) times a week., Disp: , Rfl:    megestrol (MEGACE) 40 MG tablet, Take 1 tablet (40 mg total) by mouth daily., Disp: 90 tablet, Rfl: 3   Methotrexate Sodium (METHOTREXATE, PF,) 50 MG/2ML injection, Inject 0.5 mLs into the muscle once a week. Sunday, Disp: , Rfl: 0   metoprolol succinate (TOPROL-XL) 25 MG 24 hr tablet, Take 1 tablet (25 mg total) by mouth daily., Disp: 90 tablet,  Rfl: 3   Multiple Vitamin (MULTIVITAMIN WITH MINERALS) TABS tablet, Take 1 tablet by mouth daily., Disp: , Rfl:    mupirocin ointment (BACTROBAN) 2 %, Apply 1 Application topically 2 (two) times daily., Disp: 30 g, Rfl: 2   ondansetron (ZOFRAN-ODT) 8 MG disintegrating tablet, Take 1 tablet (8 mg total) by mouth every 8 (eight) hours as needed for nausea or vomiting., Disp: 60 tablet, Rfl: 5   PERIDEX 0.12 % solution, Use as directed 15 mLs in the mouth or throat 2 (two) times daily as needed (gingivitis)., Disp: , Rfl:    polyethylene glycol (MIRALAX / GLYCOLAX) 17 g packet, Take 17 g by mouth daily as needed for mild constipation., Disp: 14 each,  Rfl: 1   predniSONE (DELTASONE) 5 MG tablet, Take 5-10 mg by mouth daily as needed (Inflammation)., Disp: , Rfl:    temazepam (RESTORIL) 30 MG capsule, Take 30 mg by mouth at bedtime as needed for sleep., Disp: , Rfl:    tiZANidine (ZANAFLEX) 4 MG tablet, TAKE 1 TABLET(4 MG) BY MOUTH TWICE DAILY (Patient taking differently: Take 4 mg by mouth 2 (two) times daily.), Disp: 90 tablet, Rfl: 5   traMADol (ULTRAM) 50 MG tablet, TAKE 1 TO 2 TABLETS(50 TO 100 MG) BY MOUTH EVERY 6 HOURS AS NEEDED (Patient taking differently: Take 50 mg by mouth 2 (two) times daily.), Disp: 180 tablet, Rfl: 1   triamcinolone lotion (KENALOG) 0.1 %, Apply topically 2 (two) times daily., Disp: 60 mL, Rfl: 5   vitamin B-12 (CYANOCOBALAMIN) 500 MCG tablet, Take 500 mcg by mouth daily., Disp: , Rfl:    vitamin C (ASCORBIC ACID) 500 MG tablet, Take 500 mg by mouth daily. , Disp: , Rfl:    [START ON 12/30/2022] HYDROcodone-acetaminophen (NORCO) 10-325 MG tablet, Take 1 tablet by mouth every 6 (six) hours as needed for moderate pain., Disp: 120 tablet, Rfl: 0  EXAM:  VITALS per patient if applicable:  GENERAL: alert, oriented, appears well and in no acute distress  HEENT: atraumatic, conjunttiva clear, no obvious abnormalities on inspection of external nose and ears  NECK: normal  movements of the head and neck  LUNGS: on inspection no signs of respiratory distress, breathing rate appears normal, no obvious gross SOB, gasping or wheezing  CV: no obvious cyanosis  MS: moves all visible extremities without noticeable abnormality  PSYCH/NEURO: pleasant and cooperative, no obvious depression or anxiety, speech and thought processing grossly intact  ASSESSMENT AND PLAN: Pain management. Indication for chronic opioid: RA Medication and dose: Norco 10-325 # pills per month: 125 Last UDS date: 07-23-22 Opioid Treatment Agreement signed (Y/N): 07-12-17 Opioid Treatment Agreement last reviewed with patient:  10-22-22 N[facility]SRS reviewed this encounter (include red flags): Yes Meds were refilled.  Gershon Crane, MD  Discussed the following assessment and plan:  No diagnosis found.     I discussed the assessment and treatment plan with the patient. The patient was provided an opportunity to ask questions and all were answered. The patient agreed with the plan and demonstrated an understanding of the instructions.   The patient was advised to call back or seek an in-person evaluation if the symptoms worsen or if the condition fails to improve as anticipated.      Review of Systems     Objective:   Physical Exam        Assessment & Plan:

## 2022-10-27 ENCOUNTER — Other Ambulatory Visit: Payer: Self-pay | Admitting: Family Medicine

## 2022-11-01 ENCOUNTER — Other Ambulatory Visit: Payer: Self-pay | Admitting: Interventional Radiology

## 2022-11-01 DIAGNOSIS — C649 Malignant neoplasm of unspecified kidney, except renal pelvis: Secondary | ICD-10-CM

## 2022-11-05 ENCOUNTER — Other Ambulatory Visit: Payer: Self-pay | Admitting: Family Medicine

## 2022-11-05 ENCOUNTER — Ambulatory Visit: Payer: 59 | Admitting: Internal Medicine

## 2022-11-05 NOTE — Telephone Encounter (Signed)
Pt LOV was on 10/18/22 Last refill was done on 04/10/22 Please advise 

## 2022-11-07 ENCOUNTER — Other Ambulatory Visit: Payer: Self-pay | Admitting: Family Medicine

## 2022-11-07 DIAGNOSIS — Z933 Colostomy status: Secondary | ICD-10-CM | POA: Diagnosis not present

## 2022-11-07 NOTE — Addendum Note (Signed)
Addended by: Christy Sartorius on: 11/07/2022 04:25 PM   Modules accepted: Orders

## 2022-11-07 NOTE — Telephone Encounter (Signed)
Asking that prescription for traMADol (ULTRAM) 50 MG tablet be redirected to  [id number] - [place],  - [address] Phone: [phone]  Fax: [phone]

## 2022-11-08 NOTE — Progress Notes (Unsigned)
Care Management & Coordination Services Pharmacy Note  11/12/2022 Name:  [name] MRN:  [id number] DOB:  [birth year]  Summary: BP at goal <140/90 LDL not at goal <100  Recommendations/Changes made from today's visit: -Counseled on proper home BP monitoring technique and importance of routine monitoring -Counseled on cholesterol-lowering diet and exercise -Recommend statin or zetia, patient declines   Follow up plan: BP call in 3 months Pharmacist visit in 6 months   Subjective: [name] is an 67 y.o. year old female who is a primary patient of Nelwyn Salisbury, MD.  The care coordination team was consulted for assistance with disease management and care coordination needs.    Engaged with patient by telephone for follow up visit.  Recent office visits: 10/18/22 Kriste Basque, DO - For AWV, no medication changes  07/23/22 Gershon Crane, MD - For HTN and other concerns. Stopped diazepam and changed triamcinolone cream  Recent consult visits: 10/15/22 Megan Salon, MD (Rheum) - For RA, no medication changes  07/02/22 Eligha Bridegroom, NP (Cardio) - For aortic dilatation and other concerns. No medication changes  06/12/22 For mammogram  Hospital visits: 09/26/22 [facility] - For small bowel obstruction, LOS 3 days. No medication changes   Objective:  Lab Results  Component Value Date   CREATININE 0.61 09/29/2022   BUN 20 09/29/2022   GFR 87.16 07/23/2022   GFRNONAA >60 09/29/2022   GFRAA 98 11/19/2017   NA 141 09/29/2022   K 3.8 09/29/2022   CALCIUM 7.9 (L) 09/29/2022   CO2 26 09/29/2022   GLUCOSE 100 (H) 09/29/2022    Lab Results  Component Value Date/Time   HGBA1C 5.5 07/23/2022 11:44 AM   HGBA1C 5.6 06/09/2015 08:30 PM   GFR 87.16 07/23/2022 11:44 AM   GFR 107.53 04/20/2019 12:02 PM    Last diabetic Eye exam: No results found for: "HMDIABEYEEXA"  Last diabetic Foot exam: No results found for: "HMDIABFOOTEX"   Lab Results  Component Value  Date   CHOL 244 (H) 07/23/2022   HDL 91.30 07/23/2022   LDLCALC 133 (H) 07/23/2022   TRIG 99.0 07/23/2022   CHOLHDL 3 07/23/2022       Latest Ref Rng & Units 09/26/2022    4:37 PM 07/23/2022   11:44 AM 07/10/2021   11:23 AM  Hepatic Function  Total Protein 6.5 - 8.1 g/dL 8.5  7.2  7.2   Albumin 3.5 - 5.0 g/dL 4.5  4.2  3.9   AST 15 - 41 U/L 29  22  24    ALT 0 - 44 U/L 18  12  18    Alk Phosphatase 38 - 126 U/L 89  73  95   Total Bilirubin 0.3 - 1.2 mg/dL 1.1  0.5  1.1   Bilirubin, Direct 0.0 - 0.3 mg/dL  0.1      Lab Results  Component Value Date/Time   TSH 1.39 07/23/2022 11:44 AM   TSH 0.901 09/13/2021 12:27 PM   FREET4 0.94 06/20/2020 11:42 AM       Latest Ref Rng & Units 09/27/2022    4:52 AM 09/26/2022    4:37 PM 07/23/2022   11:44 AM  CBC  WBC 4.0 - 10.5 K/uL 5.2  8.4  6.5   Hemoglobin 12.0 - 15.0 g/dL 40.9  81.1  91.4   Hematocrit 36.0 - 46.0 % 38.5  40.7  35.1   Platelets 150 - 400 K/uL 299  330  287.0     Lab Results  Component Value  Date/Time   VD25OH 74.85 12/20/2020 10:32 AM   VD25OH 66.24 12/21/2019 03:04 PM   VITAMINB12 1,404 (H) 12/31/2013 07:50 PM    Clinical ASCVD: Yes  The ASCVD Risk score (Arnett DK, et al., 2019) failed to calculate for the following reasons:   The patient has a prior MI or stroke diagnosis       10/18/2022    3:01 PM 01/01/2022   11:25 AM 10/16/2021    3:13 PM  Depression screen PHQ 2/9  Decreased Interest 0 0 0  Down, Depressed, Hopeless 0 0 0  PHQ - 2 Score 0 0 0  Altered sleeping 0 1   Tired, decreased energy 0 0   Change in appetite 0 0   Feeling bad or failure about yourself  0 0   Trouble concentrating 0 0   Moving slowly or fidgety/restless 0 0   Suicidal thoughts 0 0   PHQ-9 Score 0 1   Difficult doing work/chores Not difficult at all Not difficult at all      Social History   Tobacco Use  Smoking Status Former   Packs/day: 1   Types: Cigarettes   Quit date: 11/27/2012   Years since quitting: 9.9   Smokeless Tobacco Never   BP Readings from Last 3 Encounters:  09/29/22 124/72  07/23/22 120/82  07/02/22 (!) 146/72   Pulse Readings from Last 3 Encounters:  09/29/22 (!) 101  07/23/22 84  07/02/22 74   Wt Readings from Last 3 Encounters:  09/26/22 150 lb (68 kg)  07/23/22 150 lb (68 kg)  07/02/22 150 lb (68 kg)   BMI Readings from Last 3 Encounters:  09/26/22 22.81 kg/m  07/23/22 22.81 kg/m  07/02/22 22.81 kg/m    Allergies  Allergen Reactions   Spinach Itching, Rash and Other (See Comments)    Welts.    Medications Reviewed Today     Reviewed by Sherrill Raring, RPH (Pharmacist) on 11/12/22 at 1552  Med List Status: <None>   Medication Order Taking? Sig Documenting Provider Last Dose Status Informant  Abatacept 125 MG/ML SOSY [id number] No Inject 125 mg into the skin once a week. Has on Sunday [provider] Taking Active Child, Pharmacy Records           Med Note Wynona Neat, [facility]   Tue Aug 07, 2016  8:11 PM)    albuterol (VENTOLIN HFA) 108 (90 Base) MCG/ACT inhaler [id number] No INHALE 1 PUFF INTO THE LUNGS EVERY 4 HOURS AS NEEDED FOR WHEEZING OR SHORTNESS OF BREATH  Patient taking differently: Inhale 1 puff into the lungs every 4 (four) hours as needed for wheezing or shortness of breath.   Nelwyn Salisbury, MD Taking Active Child, Pharmacy Records  allopurinol (ZYLOPRIM) 100 MG tablet [id number]  TAKE 1 TABLET BY MOUTH EVERY DAY Nelwyn Salisbury, MD  Active   aspirin EC 81 MG tablet [id number] No Take 1 tablet (81 mg total) by mouth daily. Clydia Llano, MD Taking Active Child, Pharmacy Records  CALCIUM PO [id number] No Take 1 tablet by mouth 2 (two) times daily. 600 mg of calcium with vitamin D [provider] Taking Active Child, Pharmacy Records  cetirizine (ZYRTEC) 10 MG tablet [id number] No Take 1 tablet (10 mg total) by mouth daily. Nelwyn Salisbury, MD Taking Active Child, Pharmacy Records  Cholecalciferol (VITAMIN D) 2000 units CAPS [id number]  No Take 2,000 Units by mouth daily. [provider] Taking Active Child, Pharmacy Records  ciprofloxacin-dexamethasone [facility]) OTIC suspension [id number]  No Place 4 drops into both ears 2 (two) times daily.  Patient taking differently: Place 4 drops into both ears 2 (two) times daily as needed (Infection/Irritation).   Nelwyn Salisbury, MD Taking Active Child, Pharmacy Records  clobetasol ointment (TEMOVATE) 0.05 % [id number] No Apply 1 Application topically 2 (two) times daily.  Patient taking differently: Apply 1 Application topically 2 (two) times daily as needed (Dermatitis).   Nelwyn Salisbury, MD Taking Active Child, Pharmacy Records  diclofenac sodium (VOLTAREN) 1 % GEL [id number] No Apply 1 application topically 4 (four) times daily as needed (arthritis). [provider] Taking Active Child, Pharmacy Records  docusate sodium (COLACE) 100 MG capsule [id number] No Take 100 mg by mouth 2 (two) times daily. [provider] Taking Active Child, Pharmacy Records  esomeprazole (NEXIUM) 40 MG capsule [id number]  TAKE 1 CAPSULE(40 MG) BY MOUTH DAILY IN THE AFTERNOON Nelwyn Salisbury, MD  Active   folic acid (FOLVITE) 400 MCG tablet [id number] No Take 400 mcg by mouth daily. [provider] Taking Active Child, Pharmacy Records  HYDROcodone-acetaminophen [facility]) 10-325 MG tablet [id number]  Take 1 tablet by mouth every 6 (six) hours as needed for moderate pain. Nelwyn Salisbury, MD  Active   ketoconazole (NIZORAL) 2 % shampoo [id number] No Apply 1 application topically 2 (two) times a week. [provider] Taking Active Child, Pharmacy Records  megestrol (MEGACE) 40 MG tablet [id number]  Take 1 tablet (40 mg total) by mouth daily. Nelwyn Salisbury, MD  Active   Methotrexate Sodium (METHOTREXATE, PF,) 50 MG/2ML injection [id number] No Inject 0.5 mLs into the muscle once a week. Sunday [provider] Taking Active Child, Pharmacy Records           Med Note [place],  [place]   Fri Jun 29, 2016  5:35 PM)    metoprolol succinate (TOPROL-XL) 25 MG 24 hr tablet [id number] No Take 1 tablet (25 mg total) by mouth daily. Nelwyn Salisbury, MD Taking Active Child, Pharmacy Records  Multiple Vitamin (MULTIVITAMIN WITH MINERALS) TABS tablet [id number] No Take 1 tablet by mouth daily. [provider] Taking Active Child, Pharmacy Records  mupirocin ointment (BACTROBAN) 2 % [id number] No Apply 1 Application topically 2 (two) times daily. Nelwyn Salisbury, MD Taking Active Child, Pharmacy Records  ondansetron (ZOFRAN-ODT) 8 MG disintegrating tablet [id number] No Take 1 tablet (8 mg total) by mouth every 8 (eight) hours as needed for nausea or vomiting. Nelwyn Salisbury, MD Taking Active Child, Pharmacy Records  PERIDEX 0.12 % solution [id number] No Use as directed 15 mLs in the mouth or throat 2 (two) times daily as needed (gingivitis). [provider] Taking Active Child, Pharmacy Records  polyethylene glycol (MIRALAX / GLYCOLAX) 17 g packet [id number] No Take 17 g by mouth daily as needed for mild constipation. Nelwyn Salisbury, MD Taking Active Child, Pharmacy Records  predniSONE (DELTASONE) 5 MG tablet [id number] No Take 5-10 mg by mouth daily as needed (Inflammation). [provider] Taking Active Child, Pharmacy Records  temazepam (RESTORIL) 30 MG capsule [id number] No Take 30 mg by mouth at bedtime as needed for sleep. [provider] Taking Active Child, Pharmacy Records  tiZANidine (ZANAFLEX) 4 MG tablet [id number] No TAKE 1 TABLET(4 MG) BY MOUTH TWICE DAILY  Patient taking differently: Take 4 mg by mouth 2 (two) times daily.   Nelwyn Salisbury, MD Taking Active Child, Pharmacy Records  traMADol Janean Sark) 50 MG tablet [id number]  Take 2 tablets (100 mg total) by  mouth every 4 (four) hours as needed for moderate pain. Nelwyn Salisbury, MD  Active   triamcinolone lotion (KENALOG) 0.1 % [id number]  Apply topically 2 (two) times daily. Nelwyn Salisbury, MD  Active    vitamin B-12 (CYANOCOBALAMIN) 500 MCG tablet [id number] No Take 500 mcg by mouth daily. [provider] Taking Active Child, Pharmacy Records  vitamin C (ASCORBIC ACID) 500 MG tablet [id number] No Take 500 mg by mouth daily.  [provider] Taking Active Child, Pharmacy Records            SDOH:  (Social Determinants of Health) assessments and interventions performed: Yes SDOH Interventions    Flowsheet Row Care Coordination from 11/12/2022 in [facility] Telephone from 10/01/2022 in [facility] Care Coordination Chronic Care Management from 08/18/2021 in [facility] [place] Chronic Care Management from 03/09/2020 in [facility] at [place] Chronic Care Management from 11/09/2019 in [facility] [place]  SDOH Interventions       Food Insecurity Interventions Intervention Not Indicated Intervention Not Indicated Intervention Not Indicated -- --  Housing Interventions Intervention Not Indicated -- Intervention Not Indicated -- --  Transportation Interventions -- Intervention Not Indicated Intervention Not Indicated Intervention Not Indicated Intervention Not Indicated  Financial Strain Interventions -- -- Intervention Not Indicated Intervention Not Indicated Intervention Not Indicated  Stress Interventions -- -- Intervention Not Indicated -- --       Medication Assistance: None required.  Patient affirms current coverage meets needs.  Medication Access: Name and location of current pharmacy:  [id number], [place] - [address] AT [address] [place] [address] Phone: [phone] Fax: [phone]  [id number] - [place], [place] - [address] AT [facility] [address] [place] [address] Phone: [phone] Fax: [phone]  Within the past 30 days, how often has  patient missed a dose of medication? None Is a pillbox or other method used to improve adherence? Yes  Factors that may affect medication adherence? no barriers identified Are meds synced by current pharmacy? No  Are meds delivered by current pharmacy? No  Does patient experience delays in picking up medications due to transportation concerns? No   Compliance/Adherence/Medication fill history: Care Gaps: AWV - completed 10/18/2022 Last BP - 120/82 on 07/23/2022 Hep C Screen - never done Shingrix - postponed  Star-Rating Drugs: None   Assessment/Plan Hypertension (BP goal <140/90) -Controlled -Current treatment: Metoprolol XL 25mg  1 qd Appropriate, Effective, Safe, Accessible -Medications previously tried: Amlodipine, Carvedilol, Lasix -Current home readings: Wrist Cuff - checks about once a week, reports all<120/80 at home -Current dietary habits: Mindful of salt intake -Current exercise habits: see above -Denies hypotensive/hypertensive symptoms -Educated on BP goals and benefits of medications for prevention of heart attack, stroke and kidney damage; Daily salt intake goal < 2300 mg; Exercise goal of 150 minutes per week; Importance of home blood pressure monitoring; Proper BP monitoring technique; -Counseled to monitor BP at home weekly, document, and provide log at future appointments -Recommended to continue current medication  Hyperlipidemia: (LDL goal < 70) -Uncontrolled -Current treatment: None -Medications previously tried: Simvastatin (myalgia)  -Current dietary patterns: Reports little appetite recently, has not started the megestrol PCP prescribed -Current exercise habits: Reports limited exercise, no particular routine -Educated on Cholesterol goals;  Benefits of statin for ASCVD risk reduction; Importance of limiting foods high in cholesterol; Exercise goal  of 150 minutes per week; -Counseled on diet and exercise extensively Recommended updated lipid  panel at next annual visit in office with PCP, patient will consider starting a low dose statin if still elevated   Sherrill Raring Clinical Pharmacist [phone]

## 2022-11-08 NOTE — Telephone Encounter (Signed)
Pt called to say the [facility] on [address] keeps telling her they have not received the 2 Rxs for:   traMADol (ULTRAM) 50 MG tablet  and triamcinolone lotion (KENALOG) 0.1 %

## 2022-11-09 ENCOUNTER — Telehealth: Payer: Self-pay

## 2022-11-09 ENCOUNTER — Other Ambulatory Visit: Payer: Self-pay

## 2022-11-09 ENCOUNTER — Telehealth: Payer: Self-pay | Admitting: Family Medicine

## 2022-11-09 DIAGNOSIS — M0549 Rheumatoid myopathy with rheumatoid arthritis of multiple sites: Secondary | ICD-10-CM | POA: Diagnosis not present

## 2022-11-09 DIAGNOSIS — R5381 Other malaise: Secondary | ICD-10-CM | POA: Diagnosis not present

## 2022-11-09 DIAGNOSIS — M069 Rheumatoid arthritis, unspecified: Secondary | ICD-10-CM | POA: Diagnosis not present

## 2022-11-09 DIAGNOSIS — K5720 Diverticulitis of large intestine with perforation and abscess without bleeding: Secondary | ICD-10-CM | POA: Diagnosis not present

## 2022-11-09 MED ORDER — TRAMADOL HCL 50 MG PO TABS: 100.0000 mg | ORAL_TABLET | ORAL | 5 refills | Status: DC | PRN

## 2022-11-09 MED ORDER — TRIAMCINOLONE ACETONIDE 0.1 % EX LOTN: TOPICAL_LOTION | Freq: Two times a day (BID) | CUTANEOUS | 5 refills | Status: DC

## 2022-11-09 NOTE — Telephone Encounter (Signed)
Pt checking on progress of her refill request, says  [facility] on [address] keeps telling her they have not received the 2 Rxs for:    traMADol (ULTRAM) 50 MG tablet  and triamcinolone lotion (KENALOG) 0.1 %

## 2022-11-09 NOTE — Addendum Note (Signed)
Addended by: Gershon Crane A on: 11/09/2022 04:29 PM   Modules accepted: Orders

## 2022-11-09 NOTE — Telephone Encounter (Signed)
Done, but please cancel the refills at the previous pharmacy

## 2022-11-09 NOTE — Progress Notes (Signed)
Care Management & Coordination Services Pharmacy Team  Reason for Encounter: Appointment Reminder  Contacted patient to confirm telephone appointment with Delano Metz, PharmD on 11/12/2022 at 3:30. Unsuccessful outreach. Left voicemail for patient to return call.  Do you have any problems getting your medications?  If yes what types of problems are you experiencing?   What is your top health concern you would like to discuss at your upcoming visit?   Have you seen any other providers since your last visit with PCP?   Care Gaps: AWV - completed 10/18/2022 Last BP - 120/82 on 07/23/2022 Hep C Screen - never done Shingrix - postponed   Star Rating Drug: None   Inetta Fermo [facility]  Clinical Pharmacist Assistant [phone]

## 2022-11-09 NOTE — Telephone Encounter (Signed)
Pt insurance request for pt Tramadol to be transferred to [facility] on [address]. Triamcinolone lotion was already sent to the pharmacy. Last Virtual visit was 10/22/22 Last refill was on 11/06/22 Please advise

## 2022-11-12 ENCOUNTER — Ambulatory Visit: Payer: 59

## 2022-11-12 NOTE — Telephone Encounter (Signed)
Spoke with pt pharmacy [facility] on [address] advised per Dr Clent Ridges to cancel the Tramadol Rx on file, pt refill sent to [facility]

## 2022-11-26 ENCOUNTER — Ambulatory Visit
Admission: RE | Admit: 2022-11-26 | Discharge: 2022-11-26 | Disposition: A | Discharge status: Home / Self Care | Payer: 59 | Source: Ambulatory Visit | Attending: Interventional Radiology | Admitting: Interventional Radiology

## 2022-11-26 DIAGNOSIS — R911 Solitary pulmonary nodule: Secondary | ICD-10-CM | POA: Diagnosis not present

## 2022-11-26 DIAGNOSIS — I700 Atherosclerosis of aorta: Secondary | ICD-10-CM | POA: Diagnosis not present

## 2022-11-26 DIAGNOSIS — C649 Malignant neoplasm of unspecified kidney, except renal pelvis: Secondary | ICD-10-CM

## 2022-11-26 DIAGNOSIS — K7689 Other specified diseases of liver: Secondary | ICD-10-CM | POA: Diagnosis not present

## 2022-11-26 MED ORDER — IOPAMIDOL (ISOVUE-300) INJECTION 61%
100.0000 mL | Freq: Once | INTRAVENOUS | Status: AC | PRN
  Administered 2022-11-26: 100 mL via INTRAVENOUS

## 2022-11-29 ENCOUNTER — Ambulatory Visit
Admission: RE | Admit: 2022-11-29 | Discharge: 2022-11-29 | Disposition: A | Discharge status: Home / Self Care | Payer: 59 | Source: Ambulatory Visit | Attending: Interventional Radiology | Admitting: Interventional Radiology

## 2022-11-29 DIAGNOSIS — C649 Malignant neoplasm of unspecified kidney, except renal pelvis: Secondary | ICD-10-CM

## 2022-11-29 DIAGNOSIS — C220 Liver cell carcinoma: Secondary | ICD-10-CM | POA: Diagnosis not present

## 2022-11-29 NOTE — Progress Notes (Signed)
Chief Complaint: Patient was consulted remotely today (TeleHealth) for follow up after cryoablation of a left renal carcinoma.   History of Present Illness: [name] is a 67 y.o. 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  She is having some increased urinary frequency this week, but no fever or pain with urination. She is scheduled to see her doctor next Tuesday. She was admitted in early April with a small bowel obstruction that resolved and did not require surgery.  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   bilateral     Past Surgical History:  Procedure Laterality Date   ABLATION  01/04/2014   atrial flutter ablation (2 circuits) by Dr Ladona Ridgel   ATRIAL FLUTTER ABLATION N/A 01/04/2014   Procedure: ATRIAL FLUTTER ABLATION;  Surgeon: Marinus Maw, MD;  Location: [facility] CATH LAB;  Service: Cardiovascular;  Laterality: N/A;   BUNIONECTOMY Bilateral 06/2008   COLECTOMY WITH COLOSTOMY CREATION/HARTMANN PROCEDURE  11/27/2012   colectomy   COLOSTOMY   11/27/2012   HERNIA REPAIR     IR RADIOLOGIST EVAL & MGMT  10/01/2017   IR RADIOLOGIST EVAL & MGMT  11/21/2017   IR RADIOLOGIST EVAL & MGMT  02/13/2018   IR RADIOLOGIST EVAL & MGMT  01/08/2019   IR RADIOLOGIST EVAL & MGMT  12/29/2019   IR RADIOLOGIST EVAL & MGMT  01/31/2021   IR RADIOLOGIST EVAL & MGMT  12/20/2021   LAPAROSCOPIC ABDOMINAL EXPLORATION N/A 01/06/2013   Procedure: LAPAROSCOPIC ABDOMINAL EXPLORATION;  Surgeon: Ardeth Sportsman, MD;  Location: [facility] ORS;  Service: General;  Laterality: N/A;   LAPAROSCOPIC LYSIS OF ADHESIONS N/A 01/06/2013   Procedure: LAPAROSCOPIC LYSIS OF ADHESIONS/ INTEROTOMY REPAIR;  Surgeon: Ardeth Sportsman, MD;  Location: [facility] ORS;  Service: General;  Laterality: N/A;   LAPAROTOMY N/A 11/27/2012   Procedure: EXPLORATORY LAPAROTOMY SIGMOID COLECTOMY, COLOSTOMY;  Surgeon: Lodema Pilot, DO;  Location: [facility];  Service: General;  Laterality: N/A;   RADIOLOGY WITH ANESTHESIA Left 10/23/2017   Procedure: CT RENAL CRYO AND BIOPSY;  Surgeon: Irish Lack, MD;  Location: [facility];  Service: Radiology;  Laterality: Left;   TOTAL KNEE ARTHROPLASTY Bilateral 11/2006    Allergies: Spinach  Medications: Prior to Admission medications   Medication Sig Start Date End Date Taking? Authorizing Provider  Abatacept 125 MG/ML SOSY Inject 125 mg into the skin once a week. Has on Sunday    [provider]  albuterol (VENTOLIN HFA) 108 (90 Base) MCG/ACT inhaler INHALE 1 PUFF INTO THE LUNGS EVERY 4 HOURS AS NEEDED FOR WHEEZING OR SHORTNESS OF BREATH Patient taking differently: Inhale 1 puff into the lungs every 4 (four) hours as needed for wheezing or shortness of breath. 01/02/22   Nelwyn Salisbury, MD  allopurinol (ZYLOPRIM) 100 MG tablet TAKE 1 TABLET BY MOUTH EVERY DAY 10/29/22   Nelwyn Salisbury, MD  aspirin EC 81 MG tablet Take 1 tablet (81 mg total) by mouth daily. 05/11/14   Clydia Llano, MD  CALCIUM PO Take 1 tablet by mouth 2 (two) times daily. 600 mg of calcium with  vitamin D    [provider]  cetirizine (ZYRTEC) 10 MG tablet Take 1 tablet (10 mg total) by mouth daily. 11/17/14   Nelwyn Salisbury, MD  Cholecalciferol (VITAMIN D) 2000 units CAPS Take 2,000 Units by mouth daily.    [provider]  ciprofloxacin-dexamethasone (CIPRODEX) OTIC suspension Place 4 drops into both ears 2 (two) times daily. Patient taking differently: Place 4 drops into both ears 2 (two) times daily as needed (Infection/Irritation). 10/17/21   Nelwyn Salisbury, MD  clobetasol ointment (TEMOVATE) 0.05 % Apply 1 Application topically 2 (two) times daily. Patient taking differently: Apply 1 Application topically 2 (two) times daily as needed (Dermatitis). 07/23/22   Nelwyn Salisbury, MD  diclofenac sodium (VOLTAREN) 1 % GEL Apply 1 application topically 4 (four) times daily as needed (arthritis).    [provider]  docusate sodium (COLACE) 100 MG capsule Take 100 mg by mouth 2 (two) times daily.    [provider]  esomeprazole (NEXIUM) 40 MG capsule TAKE 1 CAPSULE(40 MG) BY MOUTH DAILY IN THE AFTERNOON 10/29/22   Nelwyn Salisbury, MD  folic acid (FOLVITE) 400 MCG tablet Take 400 mcg by mouth daily.    [provider]  HYDROcodone-acetaminophen (NORCO) 10-325 MG tablet Take 1 tablet by mouth every 6 (six) hours as needed for moderate pain. 12/30/22 01/29/23  Nelwyn Salisbury, MD  ketoconazole (NIZORAL) 2 % shampoo Apply 1 application topically 2 (two) times a week. 03/04/20   [provider]  megestrol (MEGACE) 40 MG tablet Take 1 tablet (40 mg total) by mouth daily. 10/22/22   Nelwyn Salisbury, MD  Methotrexate Sodium (METHOTREXATE, PF,) 50 MG/2ML injection Inject 0.5 mLs into the muscle once a week. Sunday 05/14/16   [provider]  metoprolol succinate (TOPROL-XL) 25 MG 24 hr tablet Take 1 tablet (25 mg total) by mouth daily. 01/19/22   Nelwyn Salisbury, MD  Multiple Vitamin (MULTIVITAMIN WITH MINERALS) TABS tablet Take 1 tablet by mouth daily.     [provider]  mupirocin ointment (BACTROBAN) 2 % Apply 1 Application topically 2 (two) times daily. 01/01/22   Nelwyn Salisbury, MD  ondansetron (ZOFRAN-ODT) 8 MG disintegrating tablet Take 1 tablet (8 mg total) by mouth every 8 (eight) hours as needed for nausea or vomiting. 01/19/22   Nelwyn Salisbury, MD  PERIDEX 0.12 % solution Use as directed 15 mLs in the mouth or throat 2 (two) times daily as needed (gingivitis). 03/23/20   [provider]  polyethylene glycol (MIRALAX / GLYCOLAX) 17 g packet Take 17 g by mouth daily as needed for mild constipation. 05/05/21   Nelwyn Salisbury, MD  predniSONE (DELTASONE) 5 MG tablet Take 5-10 mg by mouth daily as needed (Inflammation).    [provider]  temazepam (RESTORIL) 30 MG capsule  Take 30 mg by mouth at bedtime as needed for sleep.    [provider]  tiZANidine (ZANAFLEX) 4 MG tablet TAKE 1 TABLET(4 MG) BY MOUTH TWICE DAILY Patient taking differently: Take 4 mg by mouth 2 (two) times daily. 02/28/22   Nelwyn Salisbury, MD  traMADol (ULTRAM) 50 MG tablet Take 2 tablets (100 mg total) by mouth every 4 (four) hours as needed for moderate pain. 11/09/22   Nelwyn Salisbury, MD  triamcinolone lotion (KENALOG) 0.1 % Apply topically 2 (two) times daily. 11/09/22   Nelwyn Salisbury, MD  vitamin B-12 (CYANOCOBALAMIN) 500 MCG tablet Take 500 mcg by mouth daily.    [provider]  vitamin C (ASCORBIC ACID) 500 MG tablet Take 500 mg by mouth daily.     [provider]     Family History  Problem Relation Age of Onset   Heart attack Neg Hx    Colon cancer Neg Hx    Osteoporosis Neg Hx     Social History   Socioeconomic History   Marital status: Single    Spouse name: Not on file   Number of children: 1   Years of education: Not on file   Highest education level: Not on file  Occupational History   Not on file  Tobacco Use   Smoking status: Former    Packs/day: 1    Types: Cigarettes    Quit date: 11/27/2012     Years since quitting: 10.0   Smokeless tobacco: Never  Vaping Use   Vaping Use: Former  Substance and Sexual Activity   Alcohol use: No    Alcohol/week: 0.0 standard drinks of alcohol    Comment: recovering alcoholic sober since 1997   Drug use: Yes    Types: Hydrocodone   Sexual activity: Not Currently    Partners: Male    Birth control/protection: None  Other Topics Concern   Not on file  Social History Narrative   Not on file   Social Determinants of Health   Financial Resource Strain: Low Risk  (10/16/2021)   Overall Financial Resource Strain (CARDIA)    Difficulty of Paying Living Expenses: Not hard at all  Food Insecurity: No Food Insecurity (11/12/2022)   Hunger Vital Sign    Worried About Running Out of Food in the Last Year: Never true    Ran Out of Food in the Last Year: Never true  Transportation Needs: No Transportation Needs (10/01/2022)   PRAPARE - Administrator, Civil Service (Medical): No    Lack of Transportation (Non-Medical): No  Physical Activity: Inactive (10/16/2021)   Exercise Vital Sign    Days of Exercise per Week: 0 days    Minutes of Exercise per Session: 0 min  Stress: Stress Concern Present (10/16/2021)   [employer] of Occupational Health - Occupational Stress Questionnaire    Feeling of Stress : To some extent  Social Connections: Unknown (10/05/2020)   Social Connection and Isolation Panel [NHANES]    Frequency of Communication with Friends and Family: Not on file    Frequency of Social Gatherings with Friends and Family: Once a week    Attends Religious Services: Never    Database administrator or Organizations: No    Attends Engineer, structural: Never    Marital Status: Never married    ECOG Status: 0 - Asymptomatic  Review of Systems  Review of Systems: A 12 point ROS discussed and pertinent positives are indicated in  the HPI above.  All other systems are negative.   Physical Exam No direct physical  exam was performed (except for noted visual exam findings with Video Visits).   Vital Signs: LMP 06/25/2006 (LMP Unknown)   Imaging: CT ABDOMEN W WO CONTRAST  Result Date: 11/27/2022 CLINICAL DATA:  History of left papillary RCC with prior cryoablation. Follow-up. * Tracking Code: BO * EXAM: CT ABDOMEN WITHOUT AND WITH CONTRAST TECHNIQUE: Multidetector CT imaging of the abdomen was performed following the standard protocol before and following the bolus administration of intravenous contrast. RADIATION DOSE REDUCTION: This exam was performed according to the departmental dose-optimization program which includes automated exposure control, adjustment of the mA and/or kV according to patient size and/or use of iterative reconstruction technique. CONTRAST:  ISOVUE-300 IOPAMIDOL (ISOVUE-300) INJECTION 61% COMPARISON:  Multiple priors including CT December 18, 2021 and September 26, 2022 FINDINGS: Lower chest: Stable 4 mm pulmonary nodule in the right middle lobe on image 29/8 present dating back to 2018 compatible with a benign etiology. Bibasilar atelectasis/scarring. Hepatobiliary: Stable benign cysts in the left lobe of the liver. No suspicious hepatic lesion. Gallbladder is unremarkable. No biliary ductal dilation. Pancreas: Similar chronic borderline dilation of the pancreatic duct. No evidence of acute inflammation. Spleen: No splenomegaly. Adrenals/Urinary Tract: Bilateral adrenal glands appear normal. Bilateral cortical renal scarring. Stable left upper/interpolar ablation site without evidence of local recurrence. No solid enhancing renal mass. No hydronephrosis. Kidneys demonstrate symmetric enhancement and excretion of contrast material. Stomach/Bowel: Stomach is unremarkable for degree of distension. Left anterior abdominal wall ostomy. Colonic diverticulosis without findings of acute diverticulitis. Vascular/Lymphatic: Aortic atherosclerosis. Normal caliber abdominal aorta. No pathologically enlarged  abdominal or pelvic lymph nodes. Other: No significant abdominal free fluid.  Diastasis rectus. Musculoskeletal: Marked severity levoconvex thoracolumbar scoliosis with marked rotary component. No aggressive lytic or blastic lesion of bone. Unchanged appearance of the lucent lesion in the left iliac bone on image 122/6 present dating back to at least 2018 compatible with a benign/indolent process. IMPRESSION: 1. Stable left renal ablation site without evidence of local recurrence. 2. No evidence of metastatic disease in the abdomen. 3.  Aortic Atherosclerosis (ICD10-I70.0). Electronically Signed   By: Maudry Mayhew M.D.   On: 11/27/2022 17:02    Labs:  CBC: Recent Labs    07/23/22 1144 09/26/22 1637 09/27/22 0452  WBC 6.5 8.4 5.2  HGB 11.9* 12.9 12.3  HCT 35.1* 40.7 38.5  PLT 287.0 330 299    COAGS: No results for input(s): "INR", "APTT" in the last 8760 hours.  BMP: Recent Labs    09/26/22 1637 09/27/22 0452 09/28/22 0427 09/29/22 0524  NA 138 142 142 141  K 3.7 3.5 4.1 3.8  CL 97* 96* 104 109  CO2 29 31 29 26   GLUCOSE 143* 145* 93 100*  BUN 24* 30* 39* 20  CALCIUM 10.2 10.3 8.1* 7.9*  CREATININE 0.96 1.24* 0.91 0.61  GFRNONAA >60 48* >60 >60    LIVER FUNCTION TESTS: Recent Labs    07/23/22 1144 09/26/22 1637  BILITOT 0.5 1.1  AST 22 29  ALT 12 18  ALKPHOS 73 89  PROT 7.2 8.5*  ALBUMIN 4.2 4.5     Assessment and Plan:  I spoke with Ms. Neal over the phone.  A follow-up CT dated 11/26/2022 demonstrates a stable post ablation scar at the level of the posterior upper pole cortex of the left kidney with no evidence of recurrent renal carcinoma.  No new renal lesions are identified.  There  are no significant incidental findings.  She is now 5 years post ablation of the biopsy-proven papillary carcinoma with no evidence of recurrence.  Routine follow-up imaging can now be stopped. She will not have to follow up with me further. I recommended routine healthcare  maintenance with Dr. Clent Ridges.   Electronically Signed: Reola Calkins 11/29/2022, 1:26 PM    I spent a total of 10 Minutes in remote  clinical consultation, greater than 50% of which was counseling/coordinating care post ablation of a left renal carcinoma.    Visit type: Audio only (telephone). Audio (no video) only due to patient's lack of internet/smartphone capability. Alternative for in-person consultation at [facility], [address] [place], [place], [place]. This visit type was conducted due to national recommendations for restrictions regarding the COVID-19 Pandemic (e.g. social distancing).  This format is felt to be most appropriate for this patient at this time.  All issues noted in this document were discussed and addressed.

## 2022-12-04 ENCOUNTER — Encounter: Payer: Self-pay | Admitting: Family Medicine

## 2022-12-04 ENCOUNTER — Ambulatory Visit (INDEPENDENT_AMBULATORY_CARE_PROVIDER_SITE_OTHER): Payer: 59 | Admitting: Family Medicine

## 2022-12-04 VITALS — BP 130/80 | HR 52 | Temp 98.8°F | Wt 150.0 lb

## 2022-12-04 DIAGNOSIS — R300 Dysuria: Secondary | ICD-10-CM | POA: Diagnosis not present

## 2022-12-04 DIAGNOSIS — N390 Urinary tract infection, site not specified: Secondary | ICD-10-CM | POA: Diagnosis not present

## 2022-12-04 LAB — POC URINALSYSI DIPSTICK (AUTOMATED)
Bilirubin, UA: NEGATIVE
Glucose, UA: NEGATIVE
Ketones, UA: NEGATIVE
Nitrite, UA: NEGATIVE
Protein, UA: POSITIVE — AB
Spec Grav, UA: 1.02 (ref 1.010–1.025)
Urobilinogen, UA: 0.2 E.U./dL
pH, UA: 6 (ref 5.0–8.0)

## 2022-12-04 MED ORDER — CIPROFLOXACIN HCL 500 MG PO TABS: 500.0000 mg | ORAL_TABLET | Freq: Two times a day (BID) | ORAL | 0 refills | Status: DC

## 2022-12-04 NOTE — Progress Notes (Signed)
   Subjective:    Patient ID: [name], female    DOB: [birth year], 67 y.o.   MRN: [id number]  HPI Here for 10 days of increased frequency and urgency of urination. No burning. No fever or nausea. Drinking water and cranberry juice.   Review of Systems  Constitutional: Negative.   Respiratory: Negative.    Cardiovascular: Negative.   Genitourinary:  Positive for frequency and urgency. Negative for dysuria, flank pain and hematuria.       Objective:   Physical Exam Constitutional:      Appearance: Normal appearance.     Comments: [name]   Cardiovascular:     Rate and Rhythm: Normal rate and regular rhythm.     Pulses: Normal pulses.     Heart sounds: Normal heart sounds.  Pulmonary:     Effort: Pulmonary effort is normal.     Breath sounds: Normal breath sounds.  Abdominal:     Tenderness: There is no right CVA tenderness or left CVA tenderness.  Neurological:     Mental Status: She is alert.           Assessment & Plan:  UTI, treat with 7 days of Cipro. Culture the sample.  Gershon Crane, MD

## 2022-12-05 LAB — URINE CULTURE
MICRO NUMBER:: 15067973
SPECIMEN QUALITY:: ADEQUATE

## 2022-12-06 ENCOUNTER — Telehealth: Payer: Self-pay | Admitting: Family Medicine

## 2022-12-06 NOTE — Telephone Encounter (Signed)
Pt called in, stating she received a call from the nurse and was returning the call. Call back number: [phone].

## 2022-12-06 NOTE — Telephone Encounter (Signed)
Spoke with the patient and informed her of the urine culture results.  Message sent to PCP as patient questioned if she should continue taking Cipro?

## 2022-12-07 NOTE — Telephone Encounter (Signed)
Left a detailed message with the information below at the patient's cell number. ?

## 2022-12-07 NOTE — Telephone Encounter (Signed)
Yes finish the Cipro

## 2022-12-10 DIAGNOSIS — Z933 Colostomy status: Secondary | ICD-10-CM | POA: Diagnosis not present

## 2022-12-10 DIAGNOSIS — M069 Rheumatoid arthritis, unspecified: Secondary | ICD-10-CM | POA: Diagnosis not present

## 2022-12-10 DIAGNOSIS — R5381 Other malaise: Secondary | ICD-10-CM | POA: Diagnosis not present

## 2022-12-10 DIAGNOSIS — M0549 Rheumatoid myopathy with rheumatoid arthritis of multiple sites: Secondary | ICD-10-CM | POA: Diagnosis not present

## 2022-12-10 DIAGNOSIS — K5720 Diverticulitis of large intestine with perforation and abscess without bleeding: Secondary | ICD-10-CM | POA: Diagnosis not present

## 2023-01-01 DIAGNOSIS — E559 Vitamin D deficiency, unspecified: Secondary | ICD-10-CM | POA: Diagnosis not present

## 2023-01-01 DIAGNOSIS — E041 Nontoxic single thyroid nodule: Secondary | ICD-10-CM | POA: Diagnosis not present

## 2023-01-01 DIAGNOSIS — M810 Age-related osteoporosis without current pathological fracture: Secondary | ICD-10-CM | POA: Diagnosis not present

## 2023-01-07 DIAGNOSIS — M0579 Rheumatoid arthritis with rheumatoid factor of multiple sites without organ or systems involvement: Secondary | ICD-10-CM | POA: Diagnosis not present

## 2023-01-08 DIAGNOSIS — Z933 Colostomy status: Secondary | ICD-10-CM | POA: Diagnosis not present

## 2023-01-09 DIAGNOSIS — M0549 Rheumatoid myopathy with rheumatoid arthritis of multiple sites: Secondary | ICD-10-CM | POA: Diagnosis not present

## 2023-01-09 DIAGNOSIS — R5381 Other malaise: Secondary | ICD-10-CM | POA: Diagnosis not present

## 2023-01-09 DIAGNOSIS — M069 Rheumatoid arthritis, unspecified: Secondary | ICD-10-CM | POA: Diagnosis not present

## 2023-01-09 DIAGNOSIS — K5720 Diverticulitis of large intestine with perforation and abscess without bleeding: Secondary | ICD-10-CM | POA: Diagnosis not present

## 2023-01-10 ENCOUNTER — Other Ambulatory Visit: Payer: Self-pay | Admitting: Family Medicine

## 2023-01-13 NOTE — Progress Notes (Signed)
Cardiology Office Note:    Date:  01/21/2023   ID:  [name], DOB 11-Dec-1955, MRN [id number]  PCP:  Nelwyn Salisbury, MD   [facility] Providers Cardiologist:  Christell Constant, MD     Referring MD: Nelwyn Salisbury, MD   Chief Complaint: follow-up HFpEF, palpitations  History of Present Illness:    [name] is a 67 y.o. female with a hx of HTN, chronic HFpEF, tachycardia induced cardiomyopathy (now recovered), legal blindness, atrial flutter s/p ablation 2015, stroke (chronically disabled and wheelchair bound), RA, HTN, prior history of alcohol abuse, first degree AV block, and possible Marfan's syndrome with significant MSK abnormality and joint effusion.   She underwent successful catheter ablation of 2 distinct reentrant circuits by Dr. Ladona Ridgel due to incessant atrial flutter in 2015. She returned in 10/2016 with concerns for palpitations. Cardiac monitor revealed NSR and sinus tachycardia, rare PACs.   Seen in clinic on 04/04/20 by Dr. Katrinka Blazing. No medication changes were made and one year follow-up was recommended. He recommended repeating echo for evaluation of aortic root. Echo 03/2021 revealed LVEF 50-55%, aortic size normal, mild to moderate mitral valve leak.   Seen by  me on 09/13/21 accompanied by her daughter, traveling in a wheelchair. Not very physically active. Reported occasional palpitations that are burdensome to her. Frequent PACs seen on EKG. Had normal TSH and magnesium levels at that time. Cardiac monitor revealed NSR with average HR 82 bpm, self terminating WCT occurred on 5 occasions, longest lasting 8.5 sec. SVT longest lasting 16 sec, PAC burden 8.7%, no a fib, rare 2nd degree AVB during sleep, She declined need to start BB medication for symptoms.   Message from Gaylord Shih, [facility] with PCP that patient was advised to bring home BP cuff to office visit for calibration.   Seen in clinic by me on 07/02/22, accompanied by her daughter. Reported doing  well. She did not bring her BP cuff to visit, home BP always < 120/80. Occasionally wakes up with tightness across her chest in the mornings. No associated n/v, shortness of breath, or diaphoresis. Chest tightness does not occur at other times. She denied shortness of breath, lower extremity edema, palpitations, presyncope, syncope, orthopnea, and PND. Daughter thinks she has white coat hypertension because BP is consistently elevated at appointments but is normal at home. No specific cardiac concerns. She had stopped amlodipine due to soft BP at home. No additional changes were made and she as advised to return in 4-6 months to see Dr. Izora Ribas.   Today, she is here for follow-up and is accompanied by her daughter. She reports she has been overall well. Was hospitalized with bowel obstruction in April. Had chest pain yesterday, "I hope it was gas." No accompanying symptoms of shortness of breath, palpitations, diaphoresis, or n/v.  She reports rare palpitations.  She denies edema, orthopnea, PND, presyncope, syncope. No specific cardiac concerns.   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   bilateral     Past Surgical History:  Procedure Laterality Date   ABLATION  01/04/2014   atrial flutter ablation (2 circuits) by Dr Ladona Ridgel   ATRIAL FLUTTER ABLATION N/A 01/04/2014   Procedure: ATRIAL FLUTTER ABLATION;  Surgeon: Marinus Maw, MD;  Location: [facility] CATH LAB;  Service: Cardiovascular;  Laterality: N/A;   BUNIONECTOMY Bilateral 06/2008   COLECTOMY WITH COLOSTOMY CREATION/HARTMANN PROCEDURE  11/27/2012   colectomy   COLOSTOMY  11/27/2012   HERNIA REPAIR     IR RADIOLOGIST EVAL & MGMT  10/01/2017   IR RADIOLOGIST EVAL & MGMT  11/21/2017   IR RADIOLOGIST EVAL & MGMT  02/13/2018   IR RADIOLOGIST EVAL & MGMT  01/08/2019   IR RADIOLOGIST EVAL & MGMT  12/29/2019   IR RADIOLOGIST EVAL & MGMT  01/31/2021   IR RADIOLOGIST EVAL & MGMT  12/20/2021   LAPAROSCOPIC ABDOMINAL EXPLORATION N/A 01/06/2013   Procedure: LAPAROSCOPIC ABDOMINAL EXPLORATION;  Surgeon: Ardeth Sportsman, MD;  Location: [facility] ORS;  Service: General;  Laterality: N/A;   LAPAROSCOPIC LYSIS OF ADHESIONS N/A 01/06/2013   Procedure: LAPAROSCOPIC LYSIS OF ADHESIONS/ INTEROTOMY REPAIR;  Surgeon: Ardeth Sportsman, MD;  Location: [facility] ORS;  Service: General;  Laterality: N/A;   LAPAROTOMY N/A 11/27/2012   Procedure: EXPLORATORY LAPAROTOMY SIGMOID COLECTOMY, COLOSTOMY;  Surgeon: Lodema Pilot, DO;  Location: [facility] ORS;  Service: General;  Laterality: N/A;   RADIOLOGY WITH ANESTHESIA Left 10/23/2017   Procedure: CT RENAL CRYO AND BIOPSY;  Surgeon: Irish Lack, MD;  Location: [facility] ORS;  Service: Radiology;  Laterality: Left;   TOTAL KNEE ARTHROPLASTY Bilateral 11/2006    Current Medications: Current Meds  Medication Sig   Abatacept 125 MG/ML SOSY Inject 125 mg into the skin once a week. Has on Sunday   albuterol (VENTOLIN HFA) 108 (90 Base) MCG/ACT inhaler INHALE 1 PUFF INTO THE LUNGS EVERY 4 HOURS AS NEEDED FOR WHEEZING OR SHORTNESS OF BREATH  (Patient taking differently: Inhale 1 puff into the lungs every 4 (four) hours as needed for wheezing or shortness of breath.)   allopurinol (ZYLOPRIM) 100 MG tablet TAKE 1 TABLET BY MOUTH EVERY DAY   aspirin EC 81 MG tablet Take 1 tablet (81 mg total) by mouth daily.   CALCIUM PO Take 1 tablet by mouth 2 (two) times daily. 600 mg of calcium with vitamin D   cetirizine (ZYRTEC) 10 MG tablet Take 1 tablet (10 mg total) by mouth daily.   Cholecalciferol (VITAMIN D) 2000 units CAPS Take 2,000 Units by mouth daily.   ciprofloxacin-dexamethasone (CIPRODEX) OTIC suspension Place 4 drops into both ears 2 (two) times daily. (Patient taking differently: Place 4 drops into both ears 2 (two) times daily as needed (Infection/Irritation).)   clobetasol ointment (TEMOVATE) 0.05 % Apply 1 Application topically 2 (two) times daily. (Patient taking differently: Apply 1 Application topically 2 (two) times daily as needed (Dermatitis).)   diclofenac sodium (VOLTAREN) 1 % GEL Apply 1 application topically 4 (  four) times daily as needed (arthritis).   docusate sodium (COLACE) 100 MG capsule Take 100 mg by mouth 2 (two) times daily.   esomeprazole (NEXIUM) 40 MG capsule TAKE 1 CAPSULE(40 MG) BY MOUTH DAILY IN THE AFTERNOON   folic acid (FOLVITE) 400 MCG tablet Take 400 mcg by mouth daily.   HYDROcodone-acetaminophen (NORCO) 10-325 MG tablet Take 1 tablet by mouth every 6 (six) hours as needed for moderate pain.   ketoconazole (NIZORAL) 2 % shampoo Apply 1 application topically 2 (two) times a week.   megestrol (MEGACE) 40 MG tablet Take 1 tablet (40 mg total) by mouth daily.   Methotrexate Sodium (METHOTREXATE, PF,) 50 MG/2ML injection Inject 0.5 mLs into the muscle once a week. Sunday   metoprolol succinate (TOPROL-XL) 25 MG 24 hr tablet Take 1 tablet (25 mg total) by mouth daily.   Multiple Vitamin (MULTIVITAMIN WITH MINERALS) TABS tablet Take 1 tablet by mouth daily.   mupirocin ointment (BACTROBAN) 2 % Apply 1  Application topically 2 (two) times daily.   ondansetron (ZOFRAN-ODT) 8 MG disintegrating tablet Take 1 tablet (8 mg total) by mouth every 8 (eight) hours as needed for nausea or vomiting.   PERIDEX 0.12 % solution Use as directed 15 mLs in the mouth or throat 2 (two) times daily as needed (gingivitis).   polyethylene glycol (MIRALAX / GLYCOLAX) 17 g packet Take 17 g by mouth daily as needed for mild constipation.   predniSONE (DELTASONE) 5 MG tablet Take 5-10 mg by mouth daily as needed (Inflammation).   temazepam (RESTORIL) 30 MG capsule Take 30 mg by mouth at bedtime as needed for sleep.   tiZANidine (ZANAFLEX) 4 MG tablet TAKE 1 TABLET(4 MG) BY MOUTH TWICE DAILY   traMADol (ULTRAM) 50 MG tablet Take 2 tablets (100 mg total) by mouth every 4 (four) hours as needed for moderate pain.   triamcinolone lotion (KENALOG) 0.1 % Apply topically 2 (two) times daily.   vitamin B-12 (CYANOCOBALAMIN) 500 MCG tablet Take 500 mcg by mouth daily.   vitamin C (ASCORBIC ACID) 500 MG tablet Take 500 mg by mouth daily.      Allergies:   Spinach   Social History   Socioeconomic History   Marital status: Single    Spouse name: Not on file   Number of children: 1   Years of education: Not on file   Highest education level: Not on file  Occupational History   Not on file  Tobacco Use   Smoking status: Former    Current packs/day: 0.00    Types: Cigarettes    Quit date: 11/27/2012    Years since quitting: 10.1   Smokeless tobacco: Never  Vaping Use   Vaping status: Former  Substance and Sexual Activity   Alcohol use: No    Alcohol/week: 0.0 standard drinks of alcohol    Comment: recovering alcoholic sober since 1997   Drug use: Yes    Types: Hydrocodone   Sexual activity: Not Currently    Partners: Male    Birth control/protection: None  Other Topics Concern   Not on file  Social History Narrative   Not on file   Social Determinants of Health   Financial Resource Strain: Low Risk   (10/16/2021)   Overall Financial Resource Strain (CARDIA)    Difficulty of Paying Living Expenses: Not hard at all  Food Insecurity: No Food Insecurity (11/12/2022)   Hunger Vital Sign    Worried About Running Out of Food in the Last Year: Never  true    Ran Out of Food in the Last Year: Never true  Transportation Needs: No Transportation Needs (10/01/2022)   PRAPARE - Administrator, Civil Service (Medical): No    Lack of Transportation (Non-Medical): No  Physical Activity: Inactive (10/16/2021)   Exercise Vital Sign    Days of Exercise per Week: 0 days    Minutes of Exercise per Session: 0 min  Stress: Stress Concern Present (10/16/2021)   [employer] of Occupational Health - Occupational Stress Questionnaire    Feeling of Stress : To some extent  Social Connections: Unknown (10/05/2020)   Social Connection and Isolation Panel [NHANES]    Frequency of Communication with Friends and Family: Not on file    Frequency of Social Gatherings with Friends and Family: Once a week    Attends Religious Services: Never    Database administrator or Organizations: No    Attends Engineer, structural: Never    Marital Status: Never married     Family History: The patient's family history is negative for Heart attack, Colon cancer, and Osteoporosis.  ROS:   Please see the history of present illness.  All other systems reviewed and are negative.   Labs/Other Studies Reviewed:    The following studies were reviewed today:  Cardiac Monitor 10/13/21 Basic rhythm is NSR with average HR 82 bpm Self terminating WCT occurred 5 times, longest 8.5 seconds. No symptoms. Frequent self terminating SVT longest lasting 16 seconds, and also asymptomatic PAC burden 8.7% No atrial fibrillation. Rare second degree AVB during sllep.  Echo 03/27/21  Left Ventricle: Abnormal septal motion Note PVCls during study. Left  ventricular ejection fraction, by estimation, is 50 to 55%. The left   ventricle has low normal function. The left ventricle has no regional wall  motion abnormalities. The left  ventricular internal cavity size was normal in size. There is no left  ventricular hypertrophy. Left ventricular diastolic parameters were  normal.  Right Ventricle: The right ventricular size is normal. No increase in  right ventricular wall thickness. Right ventricular systolic function is  normal.  Left Atrium: Left atrial size was normal in size.  Right Atrium: Right atrial size was normal in size.  Pericardium: There is no evidence of pericardial effusion.  Mitral Valve: MR jet not well characterized Eccnetric posteriorly directed  ? mechanism restricted posterior leaflet motion MR looked moderate to me  as well on TTE 03/30/19. The mitral valve is abnormal. There is mild  thickening of the mitral valve  leaflet(s). Moderate mitral valve regurgitation. No evidence of mitral  valve stenosis.  Tricuspid Valve: The tricuspid valve is normal in structure. Tricuspid  valve regurgitation is mild to moderate. No evidence of tricuspid  stenosis.  Aortic Valve: The aortic valve is tricuspid. Aortic valve regurgitation is  not visualized. No aortic stenosis is present.  Pulmonic Valve: The pulmonic valve was normal in structure. Pulmonic valve  regurgitation is not visualized. No evidence of pulmonic stenosis.  Aorta: The aortic root is normal in size and structure.  Venous: The inferior vena cava is normal in size with greater than 50%  respiratory variability, suggesting right atrial pressure of 3 mmHg.  IAS/Shunts: No atrial level shunt detected by color flow Doppler.   Cardiac monitor 11/2016  Normal sinus rhythm and sinus tachycardia No reported complaints Rare PAC's   Normal study   Recent Labs: 07/23/2022: TSH 1.39 09/26/2022: ALT 18 09/27/2022: Hemoglobin 12.3; Platelets 299 09/28/2022: Magnesium 2.0 09/29/2022:  BUN 20; Creatinine, Ser 0.61; Potassium 3.8; Sodium 141   Recent Lipid Panel    Component Value Date/Time   CHOL 244 (H) 07/23/2022 1144   TRIG 99.0 07/23/2022 1144   HDL 91.30 07/23/2022 1144   CHOLHDL 3 07/23/2022 1144   VLDL 19.8 07/23/2022 1144   LDLCALC 133 (H) 07/23/2022 1144     Risk Assessment/Calculations:        Physical Exam:    VS:  BP 138/74   Pulse 63   Ht 5\' 8"  (1.727 m)   LMP 06/25/2006 (LMP Unknown)   BMI 22.81 kg/m     Wt Readings from Last 3 Encounters:  12/04/22 150 lb (68 kg)  09/26/22 150 lb (68 kg)  07/23/22 150 lb (68 kg)     GEN:  Well nourished, chronically ill appearing in no acute distress HEENT: Normal NECK: No JVD; No carotid bruits CARDIAC: Irregular RR, systolic murmur. No rubs, gallops RESPIRATORY:  Clear to auscultation without rales, wheezing or rhonchi  ABDOMEN: Soft, non-tender, non-distended MUSCULOSKELETAL:  No edema; No deformity. 2+  pedal pulses, equal bilaterally SKIN: Warm and dry NEUROLOGIC:  Alert and oriented x 3 PSYCHIATRIC:  Normal affect   EKG:  EKG is not ordered today  Diagnoses:    1. Nonrheumatic mitral valve regurgitation   2. Aortic dilatation (HCC)   3. Essential hypertension   4. Chronic diastolic CHF (congestive heart failure) (HCC)   5. Hyperlipidemia LDL goal <100   6. Palpitations   7. Marfan's syndrome   8. PAC (premature atrial contraction)      Assessment and Plan:     Aortic dilatation 2/2 Marfan's Syndrome: Suspicion for Marfan's syndrome due to significant MSK abnormality and joint effusion. Normal aortic root in size and structure, aortic valve tricuspid by echo 03/2021. No indication for further testing at this time.   Palpitaitons/Frequent PACs: Frequent PACs on cardiac monitor 09/2021 with PAC burden 8.7%, frequent self terminating SVT longest lasting 16 seconds, no atrial fibrillation. Rare palpitations since starting metoprolol. Continue metoprolol.    Hyperlipidemia/Aortic atherosclerosis: LDL 133 on 07/23/22. Lengthy discussion about  starting statin therapy for LDL goal < 70. She declines. Will discuss with Dr. Clent Ridges, PCP. Encouraged that even low dose rosuvastatin a few days a week can be effective. Also emphasized the importance of heart healthy, mostly plant based diet.   Essential hypertension: BP initially elevated but improved on my recheck. Home BP is well controlled. No additional medication changes today.   Chronic HFpEF: LVEF 50-55%,normal diastolic parameters by echo 03/2021, previously impaired LV diastolic filling on echo 03/2019. No evidence of volume overload on exam. She denies dyspnea, orthopnea, PND, edema. Continue Toprol XL. Is hesitant to add medications due to concern for side effects and history of orthostatic hypotension. No medication changes today.    Mitral regurgitation: Mild to moderate mitral regurgitation on echo 03/2021. She is asymptomatic. I do not appreciate a significant murmur on exam today. Discussed repeating echocardiogram, however in the absence of symptoms we will continue to monitor clinically for now.     Disposition: 1 year with Dr. Izora Ribas  Medication Adjustments/Labs and Tests Ordered: Current medicines are reviewed at length with the patient today.  Concerns regarding medicines are outlined above.  Orders Placed This Encounter  Procedures   ECHOCARDIOGRAM COMPLETE   No orders of the defined types were placed in this encounter.   Patient Instructions  Medication Instructions:   Your physician recommends that you continue on your current medications as directed.  Please refer to the Current Medication list given to you today.   *If you need a refill on your cardiac medications before your next appointment, please call your pharmacy*   Lab Work:  None ordered.  If you have labs (blood work) drawn today and your tests are completely normal, you will receive your results only by: MyChart Message (if you have MyChart) OR A paper copy in the mail If you have any lab  test that is abnormal or we need to change your treatment, we will call you to review the results.   Testing/Procedures:  Your physician has requested that you have an echocardiogram. Echocardiography is a painless test that uses sound waves to create images of your heart. It provides your doctor with information about the size and shape of your heart and how well your heart's chambers and valves are working. This procedure takes approximately one hour. There are no restrictions for this procedure. Please do NOT wear cologne, perfume or lotions (deodorant is allowed). Please arrive 15 minutes prior to your appointment time.    Follow-Up: At [facility], you and your health needs are our priority.  As part of our continuing mission to provide you with exceptional heart care, we have created designated Provider Care Teams.  These Care Teams include your primary Cardiologist (physician) and Advanced Practice Providers (APPs -  Physician Assistants and Nurse Practitioners) who all work together to provide you with the care you need, when you need it.  We recommend signing up for the patient portal called "MyChart".  Sign up information is provided on this After Visit Summary.  MyChart is used to connect with patients for Virtual Visits (Telemedicine).  Patients are able to view lab/test results, encounter notes, upcoming appointments, etc.  Non-urgent messages can be sent to your provider as well.   To learn more about what you can do with MyChart, go to [email or web address].    Your next appointment:   1 year(s)  Provider:   Christell Constant, MD     Other Instructions  Your physician wants you to follow-up in: 1 year.  You will receive a reminder letter in the mail two months in advance. If you don't receive a letter, please call our office to schedule the follow-up appointment.     Signed, Levi Aland, NP  01/21/2023 11:22 AM    [facility]  [facility]

## 2023-01-14 DIAGNOSIS — H7292 Unspecified perforation of tympanic membrane, left ear: Secondary | ICD-10-CM | POA: Diagnosis not present

## 2023-01-21 ENCOUNTER — Encounter: Payer: Self-pay | Admitting: Family Medicine

## 2023-01-21 ENCOUNTER — Encounter: Payer: Self-pay | Admitting: Nurse Practitioner

## 2023-01-21 ENCOUNTER — Ambulatory Visit: Payer: 59 | Attending: Internal Medicine | Admitting: Nurse Practitioner

## 2023-01-21 ENCOUNTER — Telehealth: Payer: 59 | Admitting: Family Medicine

## 2023-01-21 VITALS — BP 140/90 | Temp 98.7°F | Ht 68.0 in | Wt 150.0 lb

## 2023-01-21 VITALS — BP 138/74 | HR 63 | Ht 68.0 in

## 2023-01-21 DIAGNOSIS — F1190 Opioid use, unspecified, uncomplicated: Secondary | ICD-10-CM

## 2023-01-21 DIAGNOSIS — I340 Nonrheumatic mitral (valve) insufficiency: Secondary | ICD-10-CM

## 2023-01-21 DIAGNOSIS — I5032 Chronic diastolic (congestive) heart failure: Secondary | ICD-10-CM | POA: Diagnosis not present

## 2023-01-21 DIAGNOSIS — I10 Essential (primary) hypertension: Secondary | ICD-10-CM

## 2023-01-21 DIAGNOSIS — Q8740 Marfan's syndrome, unspecified: Secondary | ICD-10-CM

## 2023-01-21 DIAGNOSIS — I491 Atrial premature depolarization: Secondary | ICD-10-CM | POA: Diagnosis not present

## 2023-01-21 DIAGNOSIS — E785 Hyperlipidemia, unspecified: Secondary | ICD-10-CM | POA: Diagnosis not present

## 2023-01-21 DIAGNOSIS — R002 Palpitations: Secondary | ICD-10-CM | POA: Diagnosis not present

## 2023-01-21 DIAGNOSIS — M069 Rheumatoid arthritis, unspecified: Secondary | ICD-10-CM | POA: Diagnosis not present

## 2023-01-21 DIAGNOSIS — I77819 Aortic ectasia, unspecified site: Secondary | ICD-10-CM

## 2023-01-21 MED ORDER — HYDROCODONE-ACETAMINOPHEN 10-325 MG PO TABS: 1.0000 | ORAL_TABLET | Freq: Four times a day (QID) | ORAL | 0 refills | Status: DC | PRN

## 2023-01-21 NOTE — Progress Notes (Signed)
Subjective:    Patient ID: [name], female    DOB: [birth year], 67 y.o.   MRN: [id number]  HPI Virtual Visit via Video Note  I connected with the patient on 01/21/23 at  3:45 PM EDT by a video enabled telemedicine application and verified that I am speaking with the correct person using two identifiers.  Location patient: home Location provider:work or home office Persons participating in the virtual visit: patient, provider  I discussed the limitations of evaluation and management by telemedicine and the availability of in person appointments. The patient expressed understanding and agreed to proceed.   HPI: Here for pain management. She is doing well.   ROS: See pertinent positives and negatives per HPI.  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   bilateral     Past Surgical History:  Procedure Laterality Date   ABLATION  01/04/2014   atrial flutter ablation (2 circuits) by Dr Ladona Ridgel   ATRIAL FLUTTER ABLATION N/A 01/04/2014   Procedure: ATRIAL FLUTTER ABLATION;  Surgeon:  Marinus Maw, MD;  Location: [facility] CATH LAB;  Service: Cardiovascular;  Laterality: N/A;   BUNIONECTOMY Bilateral 06/2008   COLECTOMY WITH COLOSTOMY CREATION/HARTMANN PROCEDURE  11/27/2012   colectomy   COLOSTOMY  11/27/2012   HERNIA REPAIR     IR RADIOLOGIST EVAL & MGMT  10/01/2017   IR RADIOLOGIST EVAL & MGMT  11/21/2017   IR RADIOLOGIST EVAL & MGMT  02/13/2018   IR RADIOLOGIST EVAL & MGMT  01/08/2019   IR RADIOLOGIST EVAL & MGMT  12/29/2019   IR RADIOLOGIST EVAL & MGMT  01/31/2021   IR RADIOLOGIST EVAL & MGMT  12/20/2021   LAPAROSCOPIC ABDOMINAL EXPLORATION N/A 01/06/2013   Procedure: LAPAROSCOPIC ABDOMINAL EXPLORATION;  Surgeon: Ardeth Sportsman, MD;  Location: [facility];  Service: General;  Laterality: N/A;   LAPAROSCOPIC LYSIS OF ADHESIONS N/A 01/06/2013   Procedure: LAPAROSCOPIC LYSIS OF ADHESIONS/ INTEROTOMY REPAIR;  Surgeon: Ardeth Sportsman, MD;  Location: [facility];  Service: General;  Laterality: N/A;   LAPAROTOMY N/A 11/27/2012   Procedure: EXPLORATORY LAPAROTOMY SIGMOID COLECTOMY, COLOSTOMY;  Surgeon: Lodema Pilot, DO;  Location: [facility];  Service: General;  Laterality: N/A;   RADIOLOGY WITH ANESTHESIA Left 10/23/2017   Procedure: CT RENAL CRYO AND BIOPSY;  Surgeon: Irish Lack, MD;  Location: [facility];  Service: Radiology;  Laterality: Left;   TOTAL KNEE ARTHROPLASTY Bilateral 11/2006    Family History  Problem  Relation Age of Onset   Heart attack Neg Hx    Colon cancer Neg Hx    Osteoporosis Neg Hx      Current Outpatient Medications:    Abatacept 125 MG/ML SOSY, Inject 125 mg into the skin once a week. Has on Sunday, Disp: , Rfl:    albuterol (VENTOLIN HFA) 108 (90 Base) MCG/ACT inhaler, INHALE 1 PUFF INTO THE LUNGS EVERY 4 HOURS AS NEEDED FOR WHEEZING OR SHORTNESS OF BREATH (Patient taking differently: Inhale 1 puff into the lungs every 4 (four) hours as needed for wheezing or shortness of breath.), Disp: 8.5 g, Rfl: 3   allopurinol (ZYLOPRIM) 100 MG tablet, TAKE 1 TABLET  BY MOUTH EVERY DAY, Disp: 90 tablet, Rfl: 0   aspirin EC 81 MG tablet, Take 1 tablet (81 mg total) by mouth daily., Disp: , Rfl:    CALCIUM PO, Take 1 tablet by mouth 2 (two) times daily. 600 mg of calcium with vitamin D, Disp: , Rfl:    cetirizine (ZYRTEC) 10 MG tablet, Take 1 tablet (10 mg total) by mouth daily., Disp: 90 tablet, Rfl: 1   Cholecalciferol (VITAMIN D) 2000 units CAPS, Take 2,000 Units by mouth daily., Disp: , Rfl:    ciprofloxacin-dexamethasone (CIPRODEX) OTIC suspension, Place 4 drops into both ears 2 (two) times daily. (Patient taking differently: Place 4 drops into both ears 2 (two) times daily as needed (Infection/Irritation).), Disp: 7.5 mL, Rfl: 5   clobetasol ointment (TEMOVATE) 0.05 %, Apply 1 Application topically 2 (two) times daily. (Patient taking differently: Apply 1 Application topically 2 (two) times daily as needed (Dermatitis).), Disp: 60 g, Rfl: 5   diclofenac sodium (VOLTAREN) 1 % GEL, Apply 1 application topically 4 (four) times daily as needed (arthritis)., Disp: , Rfl:    docusate sodium (COLACE) 100 MG capsule, Take 100 mg by mouth 2 (two) times daily., Disp: , Rfl:    esomeprazole (NEXIUM) 40 MG capsule, TAKE 1 CAPSULE(40 MG) BY MOUTH DAILY IN THE AFTERNOON, Disp: 90 capsule, Rfl: 0   folic acid (FOLVITE) 400 MCG tablet, Take 400 mcg by mouth daily., Disp: , Rfl:    HYDROcodone-acetaminophen (NORCO) 10-325 MG tablet, Take 1 tablet by mouth every 6 (six) hours as needed for moderate pain., Disp: 120 tablet, Rfl: 0   ketoconazole (NIZORAL) 2 % shampoo, Apply 1 application topically 2 (two) times a week., Disp: , Rfl:    megestrol (MEGACE) 40 MG tablet, Take 1 tablet (40 mg total) by mouth daily., Disp: 90 tablet, Rfl: 3   Methotrexate Sodium (METHOTREXATE, PF,) 50 MG/2ML injection, Inject 0.5 mLs into the muscle once a week. Sunday, Disp: , Rfl: 0   metoprolol succinate (TOPROL-XL) 25 MG 24 hr tablet, Take 1 tablet (25 mg total) by mouth daily., Disp: 90 tablet,  Rfl: 3   Multiple Vitamin (MULTIVITAMIN WITH MINERALS) TABS tablet, Take 1 tablet by mouth daily., Disp: , Rfl:    mupirocin ointment (BACTROBAN) 2 %, Apply 1 Application topically 2 (two) times daily., Disp: 30 g, Rfl: 2   ondansetron (ZOFRAN-ODT) 8 MG disintegrating tablet, Take 1 tablet (8 mg total) by mouth every 8 (eight) hours as needed for nausea or vomiting., Disp: 60 tablet, Rfl: 5   PERIDEX 0.12 % solution, Use as directed 15 mLs in the mouth or throat 2 (two) times daily as needed (gingivitis)., Disp: , Rfl:    polyethylene glycol (MIRALAX / GLYCOLAX) 17 g packet, Take 17 g by mouth daily as needed for mild constipation., Disp: 14  each, Rfl: 1   predniSONE (DELTASONE) 5 MG tablet, Take 5-10 mg by mouth daily as needed (Inflammation)., Disp: , Rfl:    temazepam (RESTORIL) 30 MG capsule, Take 30 mg by mouth at bedtime as needed for sleep., Disp: , Rfl:    tiZANidine (ZANAFLEX) 4 MG tablet, TAKE 1 TABLET(4 MG) BY MOUTH TWICE DAILY, Disp: 90 tablet, Rfl: 5   traMADol (ULTRAM) 50 MG tablet, Take 2 tablets (100 mg total) by mouth every 4 (four) hours as needed for moderate pain., Disp: 180 tablet, Rfl: 5   triamcinolone lotion (KENALOG) 0.1 %, Apply topically 2 (two) times daily., Disp: 60 mL, Rfl: 5   vitamin B-12 (CYANOCOBALAMIN) 500 MCG tablet, Take 500 mcg by mouth daily., Disp: , Rfl:    vitamin C (ASCORBIC ACID) 500 MG tablet, Take 500 mg by mouth daily. , Disp: , Rfl:   EXAM:  VITALS per patient if applicable:  GENERAL: alert, oriented, appears well and in no acute distress  HEENT: atraumatic, conjunttiva clear, no obvious abnormalities on inspection of external nose and ears  NECK: normal movements of the head and neck  LUNGS: on inspection no signs of respiratory distress, breathing rate appears normal, no obvious gross SOB, gasping or wheezing  CV: no obvious cyanosis  MS: moves all visible extremities without noticeable abnormality  PSYCH/NEURO: pleasant and cooperative,  no obvious depression or anxiety, speech and thought processing grossly intact  ASSESSMENT AND PLAN: Pain management. Indication for chronic opioid: RA Medication and dose: Norco 10-325 # pills per month: 120 Last UDS date: 07-23-22 Opioid Treatment Agreement signed (Y/N): 07-12-17 Opioid Treatment Agreement last reviewed with patient:  01-21-23 [facility] reviewed this encounter (include red flags): Yes Meds were refilled. Gershon Crane, MD  Discussed the following assessment and plan:  Chronic narcotic use     I discussed the assessment and treatment plan with the patient. The patient was provided an opportunity to ask questions and all were answered. The patient agreed with the plan and demonstrated an understanding of the instructions.   The patient was advised to call back or seek an in-person evaluation if the symptoms worsen or if the condition fails to improve as anticipated.      Review of Systems     Objective:   Physical Exam        Assessment & Plan:

## 2023-01-21 NOTE — Patient Instructions (Signed)
Medication Instructions:   Your physician recommends that you continue on your current medications as directed. Please refer to the Current Medication list given to you today.   *If you need a refill on your cardiac medications before your next appointment, please call your pharmacy*   Lab Work:  None ordered.  If you have labs (blood work) drawn today and your tests are completely normal, you will receive your results only by: MyChart Message (if you have MyChart) OR A paper copy in the mail If you have any lab test that is abnormal or we need to change your treatment, we will call you to review the results.   Testing/Procedures:  Your physician has requested that you have an echocardiogram. Echocardiography is a painless test that uses sound waves to create images of your heart. It provides your doctor with information about the size and shape of your heart and how well your heart's chambers and valves are working. This procedure takes approximately one hour. There are no restrictions for this procedure. Please do NOT wear cologne, perfume or lotions (deodorant is allowed). Please arrive 15 minutes prior to your appointment time.    Follow-Up: At [facility] you and your health needs are our priority.  As part of our continuing mission to provide you with exceptional heart care, we have created designated Provider Care Teams.  These Care Teams include your primary Cardiologist (physician) and Advanced Practice Providers (APPs -  Physician Assistants and Nurse Practitioners) who all work together to provide you with the care you need, when you need it.  We recommend signing up for the patient portal called "MyChart".  Sign up information is provided on this After Visit Summary.  MyChart is used to connect with patients for Virtual Visits (Telemedicine).  Patients are able to view lab/test results, encounter notes, upcoming appointments, etc.  Non-urgent messages can be sent  to your provider as well.   To learn more about what you can do with MyChart, go to [email or web address].    Your next appointment:   1 year(s)  Provider:   Christell Constant, MD     Other Instructions  Your physician wants you to follow-up in: 1 year.  You will receive a reminder letter in the mail two months in advance. If you don't receive a letter, please call our office to schedule the follow-up appointment.

## 2023-01-23 ENCOUNTER — Other Ambulatory Visit: Payer: Self-pay | Admitting: Family Medicine

## 2023-01-24 ENCOUNTER — Telehealth: Payer: Self-pay | Admitting: Family Medicine

## 2023-01-24 NOTE — Telephone Encounter (Signed)
Pt is calling and would like to try low dose chole med to take about 3 times a wk  [id number] Gin[place] Otto, [place] - [address] AT [facility] Phone: [phone]  Fax: [phone]

## 2023-01-25 ENCOUNTER — Other Ambulatory Visit: Payer: Self-pay | Admitting: Family Medicine

## 2023-01-25 MED ORDER — ROSUVASTATIN CALCIUM 10 MG PO TABS: 10.0000 mg | ORAL_TABLET | ORAL | 3 refills | Status: DC

## 2023-01-25 NOTE — Telephone Encounter (Signed)
Call in Rosuvastatin 10 mg to take 3 times per week

## 2023-01-25 NOTE — Telephone Encounter (Signed)
Patient notified of update  and verbalized understanding. 

## 2023-01-28 ENCOUNTER — Other Ambulatory Visit: Payer: Self-pay | Admitting: Family Medicine

## 2023-01-31 ENCOUNTER — Other Ambulatory Visit: Payer: Self-pay | Admitting: Family Medicine

## 2023-02-01 NOTE — Telephone Encounter (Signed)
Pt LOV was on 01/21/23 Last refill done on 11/09/22 Please advise

## 2023-02-04 DIAGNOSIS — E042 Nontoxic multinodular goiter: Secondary | ICD-10-CM | POA: Diagnosis not present

## 2023-02-04 DIAGNOSIS — E041 Nontoxic single thyroid nodule: Secondary | ICD-10-CM | POA: Diagnosis not present

## 2023-02-06 DIAGNOSIS — Z933 Colostomy status: Secondary | ICD-10-CM | POA: Diagnosis not present

## 2023-02-09 DIAGNOSIS — M0549 Rheumatoid myopathy with rheumatoid arthritis of multiple sites: Secondary | ICD-10-CM | POA: Diagnosis not present

## 2023-02-09 DIAGNOSIS — M069 Rheumatoid arthritis, unspecified: Secondary | ICD-10-CM | POA: Diagnosis not present

## 2023-02-09 DIAGNOSIS — K5720 Diverticulitis of large intestine with perforation and abscess without bleeding: Secondary | ICD-10-CM | POA: Diagnosis not present

## 2023-02-09 DIAGNOSIS — R5381 Other malaise: Secondary | ICD-10-CM | POA: Diagnosis not present

## 2023-02-18 ENCOUNTER — Ambulatory Visit (HOSPITAL_COMMUNITY): Payer: 59 | Attending: Internal Medicine

## 2023-02-18 DIAGNOSIS — I77819 Aortic ectasia, unspecified site: Secondary | ICD-10-CM | POA: Insufficient documentation

## 2023-02-18 DIAGNOSIS — I340 Nonrheumatic mitral (valve) insufficiency: Secondary | ICD-10-CM | POA: Diagnosis not present

## 2023-02-18 DIAGNOSIS — I10 Essential (primary) hypertension: Secondary | ICD-10-CM | POA: Insufficient documentation

## 2023-02-18 DIAGNOSIS — E785 Hyperlipidemia, unspecified: Secondary | ICD-10-CM | POA: Insufficient documentation

## 2023-02-18 DIAGNOSIS — I5032 Chronic diastolic (congestive) heart failure: Secondary | ICD-10-CM | POA: Diagnosis not present

## 2023-02-18 LAB — ECHOCARDIOGRAM COMPLETE
Area-P 1/2: 4.41 cm2
MV M vel: 5.69 m/s
MV Peak grad: 129.5 mmHg
Radius: 0.4 cm
S' Lateral: 2.8 cm

## 2023-02-19 ENCOUNTER — Telehealth: Payer: Self-pay | Admitting: Internal Medicine

## 2023-02-19 NOTE — Telephone Encounter (Signed)
 Patient is returning call in regards to Echo results.

## 2023-02-21 NOTE — Telephone Encounter (Signed)
Called pt reviewed echo results from 02/18/23.  Answered all questions.  Wants [facility]er to know started taking rosuvastatin 3 times weekly.  Does not know the dose; reports is visually impaired and didn't ask the dose.

## 2023-02-21 NOTE — Telephone Encounter (Signed)
 Patient returned staff call regarding results.

## 2023-02-26 ENCOUNTER — Telehealth: Payer: Self-pay | Admitting: Family Medicine

## 2023-02-26 NOTE — Telephone Encounter (Signed)
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  Head and face are itching real bad.  Please advise.

## 2023-02-26 NOTE — Telephone Encounter (Signed)
Pt is aware waiting on md °

## 2023-02-27 NOTE — Telephone Encounter (Signed)
Thank you for the update!

## 2023-03-01 MED ORDER — CLOBETASOL PROPIONATE 0.05 % EX OINT: 1.0000 | TOPICAL_OINTMENT | Freq: Two times a day (BID) | CUTANEOUS | 5 refills | Status: DC

## 2023-03-01 NOTE — Telephone Encounter (Signed)
Done

## 2023-03-05 DIAGNOSIS — E559 Vitamin D deficiency, unspecified: Secondary | ICD-10-CM | POA: Diagnosis not present

## 2023-03-05 DIAGNOSIS — E041 Nontoxic single thyroid nodule: Secondary | ICD-10-CM | POA: Diagnosis not present

## 2023-03-05 DIAGNOSIS — L218 Other seborrheic dermatitis: Secondary | ICD-10-CM | POA: Diagnosis not present

## 2023-03-11 DIAGNOSIS — L218 Other seborrheic dermatitis: Secondary | ICD-10-CM | POA: Diagnosis not present

## 2023-03-11 DIAGNOSIS — Z933 Colostomy status: Secondary | ICD-10-CM | POA: Diagnosis not present

## 2023-03-12 DIAGNOSIS — M069 Rheumatoid arthritis, unspecified: Secondary | ICD-10-CM | POA: Diagnosis not present

## 2023-03-12 DIAGNOSIS — M0549 Rheumatoid myopathy with rheumatoid arthritis of multiple sites: Secondary | ICD-10-CM | POA: Diagnosis not present

## 2023-03-12 DIAGNOSIS — R5381 Other malaise: Secondary | ICD-10-CM | POA: Diagnosis not present

## 2023-03-12 DIAGNOSIS — K5720 Diverticulitis of large intestine with perforation and abscess without bleeding: Secondary | ICD-10-CM | POA: Diagnosis not present

## 2023-03-15 ENCOUNTER — Telehealth: Payer: Self-pay

## 2023-03-15 NOTE — Progress Notes (Signed)
Contacted patient to follow up on medications and blood pressure. Left voicemail.

## 2023-03-19 ENCOUNTER — Telehealth: Payer: Self-pay | Admitting: Family Medicine

## 2023-03-19 ENCOUNTER — Ambulatory Visit (INDEPENDENT_AMBULATORY_CARE_PROVIDER_SITE_OTHER): Payer: 59 | Admitting: Family Medicine

## 2023-03-19 ENCOUNTER — Encounter: Payer: Self-pay | Admitting: Family Medicine

## 2023-03-19 VITALS — BP 124/78 | HR 72 | Temp 98.9°F

## 2023-03-19 DIAGNOSIS — R197 Diarrhea, unspecified: Secondary | ICD-10-CM | POA: Diagnosis not present

## 2023-03-19 DIAGNOSIS — T887XXA Unspecified adverse effect of drug or medicament, initial encounter: Secondary | ICD-10-CM

## 2023-03-19 DIAGNOSIS — L210 Seborrhea capitis: Secondary | ICD-10-CM

## 2023-03-19 DIAGNOSIS — L299 Pruritus, unspecified: Secondary | ICD-10-CM | POA: Diagnosis not present

## 2023-03-19 MED ORDER — MEGESTROL ACETATE 400 MG/10ML PO SUSP: 200.0000 mg | Freq: Every day | ORAL | 5 refills | Status: DC

## 2023-03-19 MED ORDER — HYDROXYZINE PAMOATE 25 MG PO CAPS: 25.0000 mg | ORAL_CAPSULE | Freq: Four times a day (QID) | ORAL | 2 refills | Status: DC | PRN

## 2023-03-19 NOTE — Progress Notes (Signed)
Subjective:    Patient ID: [name], female    DOB: [birth year], 67 y.o.   MRN: [id number]  HPI Here with her daughter for several issues. First she has had flaking and itching on her scalp for several months, and 2 weeks ago she saw her dermatologist for this. She was started on Ketoconazole shampoo daily and Betamethasone lotion daily. The flaking has stopped but now she is itching all over her body, especially on the face and scalp. Also she started having diarrhea 2 weeks ago. No fever or abdominal pains. She has nausea at times, but she has not vomited. Of note we started her on Rosuvastatin a month ago for high cholesterol. They think her diarrhea is a side effect of this.    Review of Systems  Constitutional: Negative.   Respiratory: Negative.    Cardiovascular: Negative.   Gastrointestinal:  Positive for diarrhea. Negative for abdominal distention, abdominal pain, blood in stool, constipation, rectal pain and vomiting.       Objective:   Physical Exam Constitutional:      Appearance: She is not ill-appearing.     Comments: [name]   Cardiovascular:     Rate and Rhythm: Normal rate and regular rhythm.     Pulses: Normal pulses.     Heart sounds: Normal heart sounds.  Pulmonary:     Effort: Pulmonary effort is normal.     Breath sounds: Normal breath sounds.  Abdominal:     General: Abdomen is flat. Bowel sounds are normal. There is no distension.     Palpations: Abdomen is soft. There is no mass.     Tenderness: There is no abdominal tenderness. There is no guarding or rebound.     Hernia: No hernia is present.  Skin:    Findings: No erythema or rash.     Comments: Her scalp is clear   Neurological:     Mental Status: She is alert.           Assessment & Plan:  Her seborrhea has been successfully treated, and I believe her itching is now a side effect of the shampoo and lotion she has been using. I suggested she stop both of these for 4 weeks to  see if things improve. She can also use Hydroxyzine as needed for itching. If the seborrhea returns, I suggest she start back on the shampoo but to use it only twice a week. The diarrhea could be a side effect of the Rosuvastatin si we will stop this for a month to see what happens. Gershon Crane, MD

## 2023-03-19 NOTE — Telephone Encounter (Signed)
Pt is calling and forgot to let md know she would like megestrol (MEGACE) 40 MG tablet  in liquid form  [facility] #[id number] [place] Otto, [place] - [address] AT [facility] Phone: [phone]  Fax: [phone]

## 2023-03-19 NOTE — Telephone Encounter (Signed)
I changed to the liquid form

## 2023-03-22 ENCOUNTER — Other Ambulatory Visit: Payer: Self-pay | Admitting: Family Medicine

## 2023-03-25 DIAGNOSIS — E041 Nontoxic single thyroid nodule: Secondary | ICD-10-CM | POA: Diagnosis not present

## 2023-03-25 DIAGNOSIS — D34 Benign neoplasm of thyroid gland: Secondary | ICD-10-CM | POA: Diagnosis not present

## 2023-03-26 ENCOUNTER — Other Ambulatory Visit: Payer: Self-pay | Admitting: Family Medicine

## 2023-04-03 ENCOUNTER — Telehealth: Payer: Self-pay | Admitting: Internal Medicine

## 2023-04-03 NOTE — Telephone Encounter (Signed)
Inbound call from patient, states she is experiencing diarrhea. Patient states she would not like to make appointment. Only wishes to speak to her. Please advise.

## 2023-04-04 NOTE — Telephone Encounter (Signed)
Left message for pt to call back. Note from end of September pt saw PCP and in OV note mentioned stopping cholesterol med for 1 mth to see if that helped.

## 2023-04-04 NOTE — Telephone Encounter (Signed)
PT returned call to further discuss what she should do about the severe diarrhea she has. She has a colostomy bag and is very concerned.

## 2023-04-05 NOTE — Telephone Encounter (Signed)
Left message for patient to call back  

## 2023-04-05 NOTE — Telephone Encounter (Signed)
Patient returned call & stated she has been experiencing diarrhea daily for the last 3 weeks. PCP discontinued her cholesterol medication thinking it could have been this, however it has continued. She's not currently taking any stool softeners. She was last seen here in 2018. Advised her that Viviann Spare, RN will reach out on 04/10/23 to schedule 04/17/23 appointment with APP (tentatively) and in the meantime should reach out to PCP office since they have seen her more recently to discuss further. Pt verbalized all understanding.

## 2023-04-06 DIAGNOSIS — Z933 Colostomy status: Secondary | ICD-10-CM | POA: Diagnosis not present

## 2023-04-10 NOTE — Telephone Encounter (Signed)
Pt stated that she is still having diarrhea: Pt was scheduled to see Willette Cluster NP on 04/17/2023 at 1:30 PM. Pt made aware.  Pt verbalized understanding with all questions answered.

## 2023-04-10 NOTE — Telephone Encounter (Signed)
Pt was scheduled for a follow up appointment to see Willette Cluster NP on 04/17/2023 at 1:30 PM. Left message for pt to call back

## 2023-04-15 DIAGNOSIS — M0579 Rheumatoid arthritis with rheumatoid factor of multiple sites without organ or systems involvement: Secondary | ICD-10-CM | POA: Diagnosis not present

## 2023-04-15 DIAGNOSIS — M1A09X0 Idiopathic chronic gout, multiple sites, without tophus (tophi): Secondary | ICD-10-CM | POA: Diagnosis not present

## 2023-04-15 DIAGNOSIS — M1991 Primary osteoarthritis, unspecified site: Secondary | ICD-10-CM | POA: Diagnosis not present

## 2023-04-15 DIAGNOSIS — R5383 Other fatigue: Secondary | ICD-10-CM | POA: Diagnosis not present

## 2023-04-15 DIAGNOSIS — L299 Pruritus, unspecified: Secondary | ICD-10-CM | POA: Diagnosis not present

## 2023-04-15 DIAGNOSIS — Z79899 Other long term (current) drug therapy: Secondary | ICD-10-CM | POA: Diagnosis not present

## 2023-04-17 ENCOUNTER — Ambulatory Visit (INDEPENDENT_AMBULATORY_CARE_PROVIDER_SITE_OTHER)
Admission: RE | Admit: 2023-04-17 | Discharge: 2023-04-17 | Disposition: A | Discharge status: Home / Self Care | Payer: 59 | Source: Ambulatory Visit | Attending: Nurse Practitioner | Admitting: Nurse Practitioner

## 2023-04-17 ENCOUNTER — Encounter: Payer: Self-pay | Admitting: Nurse Practitioner

## 2023-04-17 ENCOUNTER — Ambulatory Visit (INDEPENDENT_AMBULATORY_CARE_PROVIDER_SITE_OTHER): Payer: 59 | Admitting: Nurse Practitioner

## 2023-04-17 VITALS — BP 148/76 | HR 80 | Ht 67.0 in | Wt 140.0 lb

## 2023-04-17 DIAGNOSIS — R110 Nausea: Secondary | ICD-10-CM

## 2023-04-17 DIAGNOSIS — R197 Diarrhea, unspecified: Secondary | ICD-10-CM

## 2023-04-17 DIAGNOSIS — K529 Noninfective gastroenteritis and colitis, unspecified: Secondary | ICD-10-CM | POA: Diagnosis not present

## 2023-04-17 DIAGNOSIS — R140 Abdominal distension (gaseous): Secondary | ICD-10-CM | POA: Diagnosis not present

## 2023-04-17 NOTE — Patient Instructions (Addendum)
Your provider has requested that you have an abdominal x ray before leaving today. Please go to the basement floor to our Radiology department for the test.  Your provider has requested that you go to the basement level for lab work before leaving today. Press "B" on the elevator. The lab is located at the first door on the left as you exit the elevator.  Due to recent changes in healthcare laws, you may see the results of your imaging and laboratory studies on MyChart before your provider has had a chance to review them.  We understand that in some cases there may be results that are confusing or concerning to you. Not all laboratory results come back in the same time frame and the provider may be waiting for multiple results in order to interpret others.  Please give [place] 48 hours in order for your provider to thoroughly review all the results before contacting the office for clarification of your results.   It was a pleasure to see you today!  Thank you for trusting me with your gastrointestinal care!

## 2023-04-17 NOTE — Progress Notes (Signed)
Brief Narrative 67 y.o. yo female known to Dr. Leone Payor ( 2018) with a past medical history not limited to severe diverticulosis s/p partial colectomy with descending  colostomy, RA, diastolic CHF, Marfan's syndrome, Aflutter, SBO, large ventral hernia  ASSESSMENT    Several week history of watery stool, decreased appetite, mild nausea and weight loss (down 10 pounds since July).  Etiologies unclear. Cannot correlate to any medications changes. Inflammatory? Infectious etiology? Exocrine pancreatic insufficiency ( borderline dilation of PD on CT scan in June 2024)  History of SBO felt to be 2/2 to adhesions (not  large ventral hernia).   Large ventral hernia (bowel containing).  Followed by Surgeon in [place]. Per Daughter, Surgeon feels surgery is risk for her.   Borderline pancreatic dilation on CT scan in June 2024  History of colon polyps.  Small tubular adenoma in 2018  Rheumatoid arthritis, on Methotrexate  See [facility] for any additional medical history   PLAN   --Stool lactoferrin, giardia antigen, C-diff, fecal elastase (keeping in mind than fecal elastase could be false positive is stool too watery) --Will contact her with results --TSH normal in Sept 2024 --Surveillance colonoscopy May 2025.     HPI   Chief complaint :  Diarrhea ( a little better), nausea,  poor appetite  [name] is here with her daughter . She has been having generalized itching, nausea, poor appetite and diarrhea for several weeks. Nausea mainly in am, relieved by belching.  Has Zofran but tries not to take it since gets relief with belching. She has a descending colostomy and usually has formed stools.  She generally has to empty  the colostomy bag every 2 to 3 days .  For the last several weeks her stools have been watery , mainly after eating.  It now takes only one day to fill the ostomy bag . Stool is not oily and she hasn't noticed any blood. She has no associated abdominal pain. She has not  weighed herself but feels certain that her weight is down. Initially thought symptoms related to addition of a cholesterol medication. Statin was stopped on 9/24 without any improvement in diarrhea ( until just yesterday).   She takes multiple medications but none of them changed except the recent addition of a statin as mentioned above and a decrease in her vitamin D dose.  No antibiotics within the last several months. TSH is normal.   [name] has a history of small bowel obstructions which have sometimes caused her to have loose stool.  Unlike now, she generally has abdominal pain and vomiting with the SBOs  Has had some dark stools but in setting of bismuth.    GI History / Studies   **May not be a complete list of studies  May 2018 Screening Colonoscopy  - One 3 mm polyp in the ascending colon, removed with a cold snare. Resected and retrieved. - Diverticulosis in the entire examined colon. - The examination was otherwise normal Surgical [P], ascending, polyp - TUBULAR ADENOMA (ONE FRAGMENT). - NO HIGH GRADE DYSPLASIA OR MALIGNANCY IDENTIFIED  Labs      Latest Ref Rng & Units 09/27/2022    4:52 AM 09/26/2022    4:37 PM 07/23/2022   11:44 AM  CBC  WBC 4.0 - 10.5 K/uL 5.2  8.4  6.5   Hemoglobin 12.0 - 15.0 g/dL 95.6  21.3  08.6   Hematocrit 36.0 - 46.0 % 38.5  40.7  35.1   Platelets 150 - 400 K/uL  299  330  287.0     Lab Results  Component Value Date   LIPASE 33 09/26/2022      Latest Ref Rng & Units 09/29/2022    5:24 AM 09/28/2022    4:27 AM 09/27/2022    4:52 AM  CMP  Glucose 70 - 99 mg/dL 469  93  629   BUN 8 - 23 mg/dL 20  39  30   Creatinine 0.44 - 1.00 mg/dL 5.28  4.13  2.44   Sodium 135 - 145 mmol/L 141  142  142   Potassium 3.5 - 5.1 mmol/L 3.8  4.1  3.5   Chloride 98 - 111 mmol/L 109  104  96   CO2 22 - 32 mmol/L 26  29  31    Calcium 8.9 - 10.3 mg/dL 7.9  8.1  01.0         ECHOCARDIOGRAM COMPLETE    ECHOCARDIOGRAM REPORT       Patient Name:   [name] Date of Exam: 02/18/2023 Medical Rec #:  [id number]       Height:       68.0 in Accession #:    [id number]      Weight:       150.0 lb Date of Birth:  1955/10/15       BSA:          1.809 m Patient Age:    66 years        BP:           138/74 mmHg Patient Gender: F               HR:           74 bpm. Exam Location:  [address]  Procedure: 2D Echo, Cardiac Doppler, Color Doppler and Strain Analysis  Indications:    I34.0 Nonrheumatic mitral (valve) insufficiency   History:        Patient has prior history of Echocardiogram examinations, most                 recent 03/27/2021. CHF, Arrythmias:PAC and Atrial Flutter; Risk                 Factors:Hypertension, Dyslipidemia and Former Smoker.                 Tachycardia. Bilateral leg edema. First degree AV block.                 Pulmonary hypertension.   Sonographer:    Cathie Beams RCS Referring Phys: [phone] Zachary George SWINYER  IMPRESSIONS   1. Left ventricular ejection fraction, by estimation, is 60 to 65%. The left ventricle has normal function. The left ventricle has no regional wall motion abnormalities. There is mild left ventricular hypertrophy. Left ventricular diastolic parameters  are consistent with Grade I diastolic dysfunction (impaired relaxation).  2. Right ventricular systolic function is normal. The right ventricular size is normal. There is mildly elevated pulmonary artery systolic pressure. The estimated right ventricular systolic pressure is 38.5 mmHg.  3. The mitral valve is abnormal. Moderate mitral valve regurgitation.  4. The aortic valve is tricuspid. Aortic valve regurgitation is not visualized.  5. The inferior vena cava is normal in size with greater than 50% respiratory variability, suggesting right atrial pressure of 3 mmHg.  Comparison(s): Changes from prior study are noted. 03/27/2021: LVEF 50-55%, moderate MR.  FINDINGS  Left Ventricle: Left ventricular ejection fraction, by estimation, is 60 to  65%. The left ventricle has normal function. The left ventricle has no regional wall motion abnormalities. The left ventricular internal cavity size was normal in size. There is  mild left ventricular hypertrophy. Left ventricular diastolic parameters are consistent with Grade I diastolic dysfunction (impaired relaxation). Indeterminate filling pressures.  Right Ventricle: The right ventricular size is normal. No increase in right ventricular wall thickness. Right ventricular systolic function is normal. There is mildly elevated pulmonary artery systolic pressure. The tricuspid regurgitant velocity is 2.98  m/s, and with an assumed right atrial pressure of 3 mmHg, the estimated right ventricular systolic pressure is 38.5 mmHg.  Left Atrium: Left atrial size was normal in size.  Right Atrium: Right atrial size was normal in size.  Pericardium: There is no evidence of pericardial effusion.  Mitral Valve: The mitral valve is abnormal. There is mild thickening of the anterior and posterior mitral valve leaflet(s). Moderate mitral valve regurgitation, with centrally-directed jet.  Tricuspid Valve: The tricuspid valve is grossly normal. Tricuspid valve regurgitation is mild.  Aortic Valve: The aortic valve is tricuspid. Aortic valve regurgitation is not visualized.  Pulmonic Valve: The pulmonic valve was normal in structure. Pulmonic valve regurgitation is not visualized.  Aorta: The aortic root and ascending aorta are structurally normal, with no evidence of dilitation.  Venous: The inferior vena cava is normal in size with greater than 50% respiratory variability, suggesting right atrial pressure of 3 mmHg.  IAS/Shunts: No atrial level shunt detected by color flow Doppler.    LEFT VENTRICLE PLAX 2D LVIDd:         4.30 cm   Diastology LVIDs:         2.80 cm   LV e' medial:    7.07 cm/s LV PW:         1.30 cm   LV E/e' medial:  10.5 LV IVS:        0.80 cm   LV e' lateral:   11.70 cm/s LVOT  diam:     2.00 cm   LV E/e' lateral: 6.4 LV SV:         51 LV SV Index:   28 LVOT Area:     3.14 cm    RIGHT VENTRICLE RV Basal diam:  2.40 cm RV Mid diam:    2.10 cm RV S prime:     12.30 cm/s TAPSE (M-mode): 1.7 cm RVSP:           38.5 mmHg  LEFT ATRIUM             Index        RIGHT ATRIUM           Index LA diam:        3.20 cm 1.77 cm/m   RA Pressure: 3.00 mmHg LA Vol (A2C):   31.3 ml 17.31 ml/m  RA Area:     14.80 cm LA Vol (A4C):   12.1 ml 6.69 ml/m   RA Volume:   35.80 ml  19.79 ml/m LA Biplane Vol: 19.5 ml 10.78 ml/m  AORTIC VALVE LVOT Vmax:   72.40 cm/s LVOT Vmean:  46.800 cm/s LVOT VTI:    0.162 m   AORTA Ao Root diam: 2.70 cm Ao Asc diam:  3.00 cm  MITRAL VALVE                  TRICUSPID VALVE MV Area (PHT): 4.41 cm       TR Peak grad:   35.5 mmHg MV Decel Time:  172 msec       TR Vmax:        298.00 cm/s MR Peak grad:    129.5 mmHg   Estimated RAP:  3.00 mmHg MR Mean grad:    80.0 mmHg    RVSP:           38.5 mmHg MR Vmax:         569.00 cm/s MR Vmean:        413.0 cm/s   SHUNTS MR PISA:         1.01 cm     Systemic VTI:  0.16 m MR PISA Eff ROA: 7 mm        Systemic Diam: 2.00 cm MR PISA Radius:  0.40 cm MV E velocity: 74.50 cm/s MV A velocity: 83.90 cm/s MV E/A ratio:  0.89  Zoila Shutter MD Electronically signed by Zoila Shutter MD Signature Date/Time: 02/18/2023/2:04:17 PM      Final      Past Medical History:  Diagnosis Date   Atrial flutter (HCC)    CHF (congestive heart failure) (HCC)    Chronic gout 2008   COVID-19 virus infection 07/04/2020   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 11/28/2012 01/05/2013   Diverticulitis of large intestine with perforation    Dysrhythmia    Fibroid    Hearing aid worn    pt wears bilateral hearing aids   Hernia 09/2006   Hx of adenomatous polyp of colon 11/13/2016   Hypertension    Legally blind    since pt was a teenager   Marfan's syndrome affecting skin    with  scolosis   Osteoporosis    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   bilateral    Past Surgical History:  Procedure Laterality Date   ABLATION  01/04/2014   atrial flutter ablation (2 circuits) by Dr Ladona Ridgel   ATRIAL FLUTTER ABLATION N/A 01/04/2014   Procedure: ATRIAL FLUTTER ABLATION;  Surgeon: Marinus Maw, MD;  Location: [facility] CATH LAB;  Service: Cardiovascular;  Laterality: N/A;   BUNIONECTOMY Bilateral 06/2008   COLECTOMY WITH COLOSTOMY CREATION/HARTMANN PROCEDURE  11/27/2012   colectomy   COLOSTOMY  11/27/2012   HERNIA REPAIR     IR RADIOLOGIST EVAL & MGMT  10/01/2017   IR RADIOLOGIST EVAL & MGMT  11/21/2017   IR RADIOLOGIST EVAL & MGMT  02/13/2018   IR RADIOLOGIST EVAL & MGMT  01/08/2019   IR RADIOLOGIST EVAL & MGMT  12/29/2019   IR RADIOLOGIST EVAL & MGMT  01/31/2021   IR RADIOLOGIST EVAL & MGMT  12/20/2021   LAPAROSCOPIC ABDOMINAL EXPLORATION N/A 01/06/2013   Procedure: LAPAROSCOPIC ABDOMINAL EXPLORATION;  Surgeon: Ardeth Sportsman, MD;  Location: [facility] ORS;  Service: General;  Laterality: N/A;   LAPAROSCOPIC LYSIS OF ADHESIONS N/A 01/06/2013   Procedure: LAPAROSCOPIC LYSIS OF ADHESIONS/ INTEROTOMY REPAIR;  Surgeon: Ardeth Sportsman, MD;  Location: [facility] ORS;  Service: General;  Laterality: N/A;   LAPAROTOMY N/A 11/27/2012   Procedure: EXPLORATORY LAPAROTOMY SIGMOID COLECTOMY, COLOSTOMY;  Surgeon: Lodema Pilot,  DO;  Location: [facility] ORS;  Service: General;  Laterality: N/A;   RADIOLOGY WITH ANESTHESIA Left 10/23/2017   Procedure: CT RENAL CRYO AND BIOPSY;  Surgeon: Irish Lack, MD;  Location: [facility]  ORS;  Service: Radiology;  Laterality: Left;   TOTAL KNEE ARTHROPLASTY Bilateral 11/2006   Family History  Problem Relation Age of Onset   Heart attack Neg Hx    Colon cancer Neg Hx    Osteoporosis Neg Hx    Social History   Tobacco Use   Smoking status: Former    Current packs/day: 0.00    Types: Cigarettes    Quit date: 11/27/2012    Years since quitting: 10.3   Smokeless tobacco: Never  Vaping Use   Vaping status: Former  Substance Use Topics   Alcohol use: No    Alcohol/week: 0.0 standard drinks of alcohol    Comment: recovering alcoholic sober since 1997   Drug use: Yes    Types: Hydrocodone   Current Outpatient Medications  Medication Sig Dispense Refill   Abatacept 125 MG/ML SOSY Inject 125 mg into the skin once a week. Has on Sunday     albuterol (VENTOLIN HFA) 108 (90 Base) MCG/ACT inhaler INHALE 1 PUFF INTO THE LUNGS EVERY 4 HOURS AS NEEDED FOR WHEEZING OR SHORTNESS OF BREATH 8.5 g 3   allopurinol (ZYLOPRIM) 100 MG tablet TAKE 1 TABLET BY MOUTH EVERY DAY 90 tablet 0   aspirin EC 81 MG tablet Take 1 tablet (81 mg total) by mouth daily.     betamethasone dipropionate 0.05 % lotion Apply 1 Application topically as needed.     CALCIUM PO Take 1 tablet by mouth 2 (two) times daily. 600 mg of calcium with vitamin D     cetirizine (ZYRTEC) 10 MG tablet Take 1 tablet (10 mg total) by mouth daily. 90 tablet 1   Cholecalciferol (VITAMIN D) 2000 units CAPS Take 1,000 Units by mouth daily.     ciprofloxacin-dexamethasone (CIPRODEX) OTIC suspension Place 4 drops into both ears 2 (two) times daily. (Patient taking differently: Place 4 drops into both ears 2 (two) times daily as needed (Infection/Irritation).) 7.5 mL 5   clobetasol ointment (TEMOVATE) 0.05 % Apply 1 Application topically 2 (two) times daily. 60 g 5   diclofenac sodium (VOLTAREN) 1 % GEL Apply 1 application topically 4 (four) times daily as needed (arthritis).     esomeprazole (NEXIUM) 40 MG capsule TAKE 1 CAPSULE(40  MG) BY MOUTH DAILY IN THE AFTERNOON 90 capsule 0   folic acid (FOLVITE) 400 MCG tablet Take 400 mcg by mouth daily.     HYDROcodone-acetaminophen (NORCO) 10-325 MG tablet Take 1 tablet by mouth every 6 (six) hours as needed for moderate pain. 120 tablet 0   hydrocortisone 2.5 % cream Apply 1 Application topically 2 (two) times daily as needed.     hydrOXYzine (VISTARIL) 25 MG capsule Take 1 capsule (25 mg total) by mouth every 6 (six) hours as needed for itching. 60 capsule 2   ketoconazole (NIZORAL) 2 % shampoo Apply 1 application topically 2 (two) times a week.     megestrol (MEGACE) 400 MG/10ML suspension Take 5 mLs (200 mg total) by mouth daily. 240 mL 5   Methotrexate Sodium (METHOTREXATE, PF,) 50 MG/2ML injection Inject 0.5 mLs into the muscle once a week. Sunday  0   metoprolol succinate (TOPROL-XL) 25 MG 24 hr tablet TAKE 1 TABLET(25 MG) BY MOUTH DAILY 90 tablet 0   Multiple Vitamin (MULTIVITAMIN WITH MINERALS) TABS tablet Take  1 tablet by mouth daily.     mupirocin ointment (BACTROBAN) 2 % Apply 1 Application topically 2 (two) times daily. 30 g 2   ondansetron (ZOFRAN-ODT) 8 MG disintegrating tablet Take 1 tablet (8 mg total) by mouth every 8 (eight) hours as needed for nausea or vomiting. 60 tablet 5   PERIDEX 0.12 % solution Use as directed 15 mLs in the mouth or throat 2 (two) times daily as needed (gingivitis).     predniSONE (DELTASONE) 5 MG tablet Take 5-10 mg by mouth daily as needed (Inflammation).     rosuvastatin (CRESTOR) 10 MG tablet Take 1 tablet (10 mg total) by mouth 3 (three) times a week. 90 tablet 3   temazepam (RESTORIL) 30 MG capsule Take 30 mg by mouth at bedtime as needed for sleep.     tiZANidine (ZANAFLEX) 4 MG tablet TAKE 1 TABLET(4 MG) BY MOUTH TWICE DAILY 90 tablet 5   traMADol (ULTRAM) 50 MG tablet Take 2 tablets (100 mg total) by mouth every 6 (six) hours as needed for moderate pain. 180 tablet 5   triamcinolone lotion (KENALOG) 0.1 % Apply topically 2 (two)  times daily. 60 mL 5   vitamin B-12 (CYANOCOBALAMIN) 500 MCG tablet Take 500 mcg by mouth daily.     vitamin C (ASCORBIC ACID) 500 MG tablet Take 500 mg by mouth daily.      No current facility-administered medications for this visit.   Allergies  Allergen Reactions   Spinach Itching, Rash and Other (See Comments)    Welts.     Review of Systems: All systems reviewed and negative except where noted in HPI.   Wt Readings from Last 3 Encounters:  01/21/23 150 lb (68 kg)  12/04/22 150 lb (68 kg)  09/26/22 150 lb (68 kg)    Physical Exam:  BP (!) 148/76 (BP Location: Left Arm, Patient Position: Sitting, Cuff Size: Normal)   Pulse 80   LMP 06/25/2006 (LMP Unknown)  Constitutional:  Pleasant, generally well appearing female in no acute distress. In wheelchair.  Psychiatric:  Normal mood and affect. Behavior is normal. EENT: Pupils normal.  Conjunctivae are normal. No scleral icterus. Neck supple.  Cardiovascular: Normal rate, regular rhythm.  Pulmonary/chest: Effort normal and breath sounds normal. No wheezing, rales or rhonchi. Abdominal: Limited exam in wheelchair. Abdomen is soft, nondistended, nontender. Bowel sounds active throughout. Large ventral hernia. Left sided descending colostomy.  Neurological: Alert and oriented to person place and time.  Willette Cluster, NP  04/17/2023, 1:50 PM  Cc:  Referring Provider Nelwyn Salisbury, MD

## 2023-04-22 ENCOUNTER — Telehealth (INDEPENDENT_AMBULATORY_CARE_PROVIDER_SITE_OTHER): Payer: 59 | Admitting: Family Medicine

## 2023-04-22 ENCOUNTER — Encounter: Payer: Self-pay | Admitting: Family Medicine

## 2023-04-22 DIAGNOSIS — M069 Rheumatoid arthritis, unspecified: Secondary | ICD-10-CM

## 2023-04-22 DIAGNOSIS — F1190 Opioid use, unspecified, uncomplicated: Secondary | ICD-10-CM

## 2023-04-22 MED ORDER — HYDROCODONE-ACETAMINOPHEN 10-325 MG PO TABS: 1.0000 | ORAL_TABLET | Freq: Four times a day (QID) | ORAL | 0 refills | Status: DC | PRN

## 2023-04-22 NOTE — Progress Notes (Signed)
Subjective:    Patient ID: [name], female    DOB: Dec 12, 1955, 67 y.o.   MRN: [id number]  HPI Virtual Visit via Video Note  I connected with the patient on 04/22/23 at  3:45 PM EDT by a video enabled telemedicine application and verified that I am speaking with the correct person using two identifiers.  Location patient: home Location provider:work or home office Persons participating in the virtual visit: patient, provider  I discussed the limitations of evaluation and management by telemedicine and the availability of in person appointments. The patient expressed understanding and agreed to proceed.   HPI: Here for pain management. She is doing well.    ROS: See pertinent positives and negatives per HPI.  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   bilateral     Past Surgical History:  Procedure Laterality Date   ABLATION  01/04/2014   atrial flutter ablation (2 circuits) by Dr Ladona Ridgel   ATRIAL FLUTTER  ABLATION N/A 01/04/2014   Procedure: ATRIAL FLUTTER ABLATION;  Surgeon: Marinus Maw, MD;  Location: [facility] CATH LAB;  Service: Cardiovascular;  Laterality: N/A;   BUNIONECTOMY Bilateral 06/2008   COLECTOMY WITH COLOSTOMY CREATION/HARTMANN PROCEDURE  11/27/2012   colectomy   COLOSTOMY  11/27/2012   HERNIA REPAIR     IR RADIOLOGIST EVAL & MGMT  10/01/2017   IR RADIOLOGIST EVAL & MGMT  11/21/2017   IR RADIOLOGIST EVAL & MGMT  02/13/2018   IR RADIOLOGIST EVAL & MGMT  01/08/2019   IR RADIOLOGIST EVAL & MGMT  12/29/2019   IR RADIOLOGIST EVAL & MGMT  01/31/2021   IR RADIOLOGIST EVAL & MGMT  12/20/2021   LAPAROSCOPIC ABDOMINAL EXPLORATION N/A 01/06/2013   Procedure: LAPAROSCOPIC ABDOMINAL EXPLORATION;  Surgeon: Ardeth Sportsman, MD;  Location: [facility] ORS;  Service: General;  Laterality: N/A;   LAPAROSCOPIC LYSIS OF ADHESIONS N/A 01/06/2013   Procedure: LAPAROSCOPIC LYSIS OF ADHESIONS/ INTEROTOMY REPAIR;  Surgeon: Ardeth Sportsman, MD;  Location: [facility] ORS;  Service: General;  Laterality: N/A;   LAPAROTOMY N/A 11/27/2012   Procedure: EXPLORATORY LAPAROTOMY SIGMOID COLECTOMY, COLOSTOMY;  Surgeon: Lodema Pilot, DO;  Location: [facility] ORS;  Service: General;  Laterality: N/A;   RADIOLOGY WITH ANESTHESIA Left 10/23/2017   Procedure: CT RENAL CRYO AND BIOPSY;  Surgeon: Irish Lack, MD;  Location: [facility];  Service: Radiology;  Laterality:  Left;   TOTAL KNEE ARTHROPLASTY Bilateral 11/2006    Family History  Problem Relation Age of Onset   Heart attack Neg Hx    Colon cancer Neg Hx    Osteoporosis Neg Hx      Current Outpatient Medications:    Abatacept 125 MG/ML SOSY, Inject 125 mg into the skin once a week. Has on Sunday, Disp: , Rfl:    albuterol (VENTOLIN HFA) 108 (90 Base) MCG/ACT inhaler, INHALE 1 PUFF INTO THE LUNGS EVERY 4 HOURS AS NEEDED FOR WHEEZING OR SHORTNESS OF BREATH, Disp: 8.5 g, Rfl: 3   allopurinol (ZYLOPRIM) 100 MG tablet, TAKE 1 TABLET BY MOUTH EVERY DAY, Disp: 90 tablet, Rfl: 0    aspirin EC 81 MG tablet, Take 1 tablet (81 mg total) by mouth daily., Disp: , Rfl:    betamethasone dipropionate 0.05 % lotion, Apply 1 Application topically as needed., Disp: , Rfl:    CALCIUM PO, Take 1 tablet by mouth 2 (two) times daily. 600 mg of calcium with vitamin D, Disp: , Rfl:    cetirizine (ZYRTEC) 10 MG tablet, Take 1 tablet (10 mg total) by mouth daily., Disp: 90 tablet, Rfl: 1   Cholecalciferol (VITAMIN D) 2000 units CAPS, Take 1,000 Units by mouth daily., Disp: , Rfl:    ciprofloxacin-dexamethasone (CIPRODEX) OTIC suspension, Place 4 drops into both ears 2 (two) times daily. (Patient taking differently: Place 4 drops into both ears 2 (two) times daily as needed (Infection/Irritation).), Disp: 7.5 mL, Rfl: 5   clobetasol ointment (TEMOVATE) 0.05 %, Apply 1 Application topically 2 (two) times daily., Disp: 60 g, Rfl: 5   diclofenac sodium (VOLTAREN) 1 % GEL, Apply 1 application topically 4 (four) times daily as needed (arthritis)., Disp: , Rfl:    docusate sodium (COLACE) 100 MG capsule, Take 100 mg by mouth 2 (two) times daily., Disp: , Rfl:    esomeprazole (NEXIUM) 40 MG capsule, TAKE 1 CAPSULE(40 MG) BY MOUTH DAILY IN THE AFTERNOON, Disp: 90 capsule, Rfl: 0   folic acid (FOLVITE) 400 MCG tablet, Take 400 mcg by mouth daily., Disp: , Rfl:    hydrocortisone 2.5 % cream, Apply 1 Application topically 2 (two) times daily as needed., Disp: , Rfl:    hydrOXYzine (VISTARIL) 25 MG capsule, Take 1 capsule (25 mg total) by mouth every 6 (six) hours as needed for itching., Disp: 60 capsule, Rfl: 2   ketoconazole (NIZORAL) 2 % shampoo, Apply 1 application topically 2 (two) times a week., Disp: , Rfl:    megestrol (MEGACE) 400 MG/10ML suspension, Take 5 mLs (200 mg total) by mouth daily., Disp: 240 mL, Rfl: 5   Methotrexate Sodium (METHOTREXATE, PF,) 50 MG/2ML injection, Inject 0.5 mLs into the muscle once a week. Sunday, Disp: , Rfl: 0   metoprolol succinate (TOPROL-XL) 25 MG 24 hr tablet, TAKE  1 TABLET(25 MG) BY MOUTH DAILY, Disp: 90 tablet, Rfl: 0   Multiple Vitamin (MULTIVITAMIN WITH MINERALS) TABS tablet, Take 1 tablet by mouth daily., Disp: , Rfl:    mupirocin ointment (BACTROBAN) 2 %, Apply 1 Application topically 2 (two) times daily., Disp: 30 g, Rfl: 2   ondansetron (ZOFRAN-ODT) 8 MG disintegrating tablet, Take 1 tablet (8 mg total) by mouth every 8 (eight) hours as needed for nausea or vomiting., Disp: 60 tablet, Rfl: 5   PERIDEX 0.12 % solution, Use as directed 15 mLs in the mouth or throat 2 (two) times daily as needed (gingivitis)., Disp: , Rfl:    polyethylene glycol (MIRALAX /  GLYCOLAX) 17 g packet, Take 17 g by mouth daily as needed for mild constipation., Disp: 14 each, Rfl: 1   predniSONE (DELTASONE) 5 MG tablet, Take 5-10 mg by mouth daily as needed (Inflammation)., Disp: , Rfl:    rosuvastatin (CRESTOR) 10 MG tablet, Take 1 tablet (10 mg total) by mouth 3 (three) times a week., Disp: 90 tablet, Rfl: 3   temazepam (RESTORIL) 30 MG capsule, Take 30 mg by mouth at bedtime as needed for sleep., Disp: , Rfl:    tiZANidine (ZANAFLEX) 4 MG tablet, TAKE 1 TABLET(4 MG) BY MOUTH TWICE DAILY, Disp: 90 tablet, Rfl: 5   traMADol (ULTRAM) 50 MG tablet, Take 2 tablets (100 mg total) by mouth every 6 (six) hours as needed for moderate pain., Disp: 180 tablet, Rfl: 5   triamcinolone lotion (KENALOG) 0.1 %, Apply topically 2 (two) times daily., Disp: 60 mL, Rfl: 5   vitamin B-12 (CYANOCOBALAMIN) 500 MCG tablet, Take 500 mcg by mouth daily., Disp: , Rfl:    vitamin C (ASCORBIC ACID) 500 MG tablet, Take 500 mg by mouth daily. , Disp: , Rfl:    [START ON 04/30/2023] HYDROcodone-acetaminophen (NORCO) 10-325 MG tablet, Take 1 tablet by mouth every 6 (six) hours as needed for moderate pain (pain score 4-6)., Disp: 120 tablet, Rfl: 0   [START ON 04/30/2023] HYDROcodone-acetaminophen (NORCO) 10-325 MG tablet, Take 1 tablet by mouth every 6 (six) hours as needed for moderate pain (pain score 4-6).,  Disp: 120 tablet, Rfl: 0   [START ON 04/30/2023] HYDROcodone-acetaminophen (NORCO) 10-325 MG tablet, Take 1 tablet by mouth every 6 (six) hours as needed for moderate pain (pain score 4-6)., Disp: 120 tablet, Rfl: 0  EXAM:  VITALS per patient if applicable:  GENERAL: alert, oriented, appears well and in no acute distress  HEENT: atraumatic, conjunttiva clear, no obvious abnormalities on inspection of external nose and ears  NECK: normal movements of the head and neck  LUNGS: on inspection no signs of respiratory distress, breathing rate appears normal, no obvious gross SOB, gasping or wheezing  CV: no obvious cyanosis  MS: moves all visible extremities without noticeable abnormality  PSYCH/NEURO: pleasant and cooperative, no obvious depression or anxiety, speech and thought processing grossly intact  ASSESSMENT AND PLAN: Pain management.  Indication for chronic opioid: RA Medication and dose: Norco 10-325 # pills per month: 120 Last UDS date: 07-23-22 Opioid Treatment Agreement signed (Y/N): 07-12-17 Opioid Treatment Agreement last reviewed with patient:  04-22-23 NCCSRS reviewed this encounter (include red flags): Yes Meds were refilled.  Gershon Crane, MD  Discussed the following assessment and plan:  No diagnosis found.     I discussed the assessment and treatment plan with the patient. The patient was provided an opportunity to ask questions and all were answered. The patient agreed with the plan and demonstrated an understanding of the instructions.   The patient was advised to call back or seek an in-person evaluation if the symptoms worsen or if the condition fails to improve as anticipated.      Review of Systems     Objective:   Physical Exam        Assessment & Plan:

## 2023-04-25 ENCOUNTER — Other Ambulatory Visit: Payer: Self-pay | Admitting: Family Medicine

## 2023-04-30 DIAGNOSIS — M810 Age-related osteoporosis without current pathological fracture: Secondary | ICD-10-CM | POA: Diagnosis not present

## 2023-05-06 DIAGNOSIS — Z933 Colostomy status: Secondary | ICD-10-CM | POA: Diagnosis not present

## 2023-05-15 ENCOUNTER — Ambulatory Visit: Payer: 59

## 2023-05-15 ENCOUNTER — Other Ambulatory Visit: Payer: Self-pay

## 2023-05-15 DIAGNOSIS — R110 Nausea: Secondary | ICD-10-CM

## 2023-05-15 DIAGNOSIS — R197 Diarrhea, unspecified: Secondary | ICD-10-CM

## 2023-05-16 LAB — FECAL LACTOFERRIN, QUANT
Fecal Lactoferrin: NEGATIVE
MICRO NUMBER:: 15757032
SPECIMEN QUALITY:: ADEQUATE

## 2023-05-16 LAB — CLOSTRIDIUM DIFFICILE BY PCR: Toxigenic C. Difficile by PCR: NEGATIVE

## 2023-05-16 LAB — GIARDIA ANTIGEN
MICRO NUMBER:: 15756656
RESULT:: NOT DETECTED
SPECIMEN QUALITY:: ADEQUATE

## 2023-05-22 LAB — PANCREATIC ELASTASE, FECAL: Pancreatic Elastase-1, Stool: >800 ug/g (ref >200)

## 2023-06-03 DIAGNOSIS — Z933 Colostomy status: Secondary | ICD-10-CM | POA: Diagnosis not present

## 2023-06-14 ENCOUNTER — Other Ambulatory Visit: Payer: Self-pay

## 2023-06-14 ENCOUNTER — Telehealth: Payer: Self-pay

## 2023-06-14 ENCOUNTER — Other Ambulatory Visit: Payer: Self-pay | Admitting: Family Medicine

## 2023-06-14 MED ORDER — ESOMEPRAZOLE MAGNESIUM 40 MG PO CPDR: 40.0000 mg | DELAYED_RELEASE_CAPSULE | Freq: Every day | ORAL | 0 refills | Status: DC

## 2023-06-14 NOTE — Telephone Encounter (Signed)
Copied from CRM [id number]. Topic: Clinical - Medication Refill >> Jun 14, 2023 12:39 PM Almira Coaster wrote: Most Recent Primary Care Visit:  Provider: Gershon Crane A  Department: [facility]  Visit Type: MYCHART VIDEO VISIT  Date: 04/22/2023  Medication: esomeprazole (NEXIUM) 40 MG capsule  Has the patient contacted their pharmacy? Yes, they told patient to contact pcp (Agent: If no, request that the patient contact the pharmacy for the refill. If patient does not wish to contact the pharmacy document the reason why and proceed with request.) (Agent: If yes, when and what did the pharmacy advise?)  Is this the correct pharmacy for this prescription? Yes If no, delete pharmacy and type the correct one.  This is the patient's preferred pharmacy:  [facility] [address], [place] - [address] AT [address] [place] [address] Phone: [phone] Fax: [phone]   Has the prescription been filled recently? No  Is the patient out of the medication? Yes  Has the patient been seen for an appointment in the last year OR does the patient have an upcoming appointment? Yes  Can we respond through MyChart? No  Agent: Please be advised that Rx refills may take up to 3 business days. We ask that you follow-up with your pharmacy.

## 2023-06-14 NOTE — Telephone Encounter (Signed)
Rx sent 

## 2023-07-05 DIAGNOSIS — Z933 Colostomy status: Secondary | ICD-10-CM | POA: Diagnosis not present

## 2023-07-08 ENCOUNTER — Other Ambulatory Visit: Payer: Self-pay | Admitting: Family Medicine

## 2023-07-08 DIAGNOSIS — M0579 Rheumatoid arthritis with rheumatoid factor of multiple sites without organ or systems involvement: Secondary | ICD-10-CM | POA: Diagnosis not present

## 2023-07-23 ENCOUNTER — Other Ambulatory Visit: Payer: Self-pay

## 2023-07-23 ENCOUNTER — Encounter: Payer: Self-pay | Admitting: Family Medicine

## 2023-07-23 ENCOUNTER — Ambulatory Visit (INDEPENDENT_AMBULATORY_CARE_PROVIDER_SITE_OTHER): Payer: 59 | Admitting: Family Medicine

## 2023-07-23 VITALS — BP 118/78 | HR 61 | Temp 98.8°F | Wt 140.0 lb

## 2023-07-23 DIAGNOSIS — F1190 Opioid use, unspecified, uncomplicated: Secondary | ICD-10-CM

## 2023-07-23 DIAGNOSIS — G8929 Other chronic pain: Secondary | ICD-10-CM | POA: Diagnosis not present

## 2023-07-23 DIAGNOSIS — M069 Rheumatoid arthritis, unspecified: Secondary | ICD-10-CM

## 2023-07-23 MED ORDER — TIZANIDINE HCL 4 MG PO TABS: ORAL_TABLET | ORAL | 3 refills | Status: DC

## 2023-07-23 MED ORDER — HYDROCODONE-ACETAMINOPHEN 10-325 MG PO TABS: 1.0000 | ORAL_TABLET | Freq: Four times a day (QID) | ORAL | 0 refills | Status: DC | PRN

## 2023-07-23 MED ORDER — TRAMADOL HCL 50 MG PO TABS: 100.0000 mg | ORAL_TABLET | Freq: Four times a day (QID) | ORAL | 5 refills | Status: DC | PRN

## 2023-07-23 MED ORDER — HYDROCODONE BIT-HOMATROP MBR 5-1.5 MG/5ML PO SOLN: 5.0000 mL | ORAL | 0 refills | Status: DC | PRN

## 2023-07-23 NOTE — Progress Notes (Signed)
   Subjective:    Patient ID: Rollene Rotunda, female    DOB: Aug 19, 1955, 68 y.o.   MRN: [id number]  HPI Here for pain management. She is doing well. The cold weather makes her joints stiff.    Review of Systems  Constitutional: Negative.   Musculoskeletal:  Positive for arthralgias.       Objective:   Physical Exam Constitutional:      Comments: [name]   Neurological:     Mental Status: She is alert.           Assessment & Plan:  Pain management.  Indication for chronic opioid: RA Medication and dose: Norco 10-325 # pills per month: 120 Last UDS date: 07-23-23 Opioid Treatment Agreement signed (Y/N): 07-12-17 Opioid Treatment Agreement last reviewed with patient:   NCCSRS reviewed this encounter (include red flags): Yes Meds were refilled.  Gershon Crane, MD

## 2023-07-24 ENCOUNTER — Other Ambulatory Visit: Payer: Self-pay | Admitting: Family Medicine

## 2023-07-25 ENCOUNTER — Other Ambulatory Visit: Payer: Self-pay | Admitting: Family Medicine

## 2023-07-25 LAB — DRUG MONITOR, PANEL 1, W/CONF, URINE
Amphetamines: NEGATIVE ng/mL (ref <500)
Barbiturates: NEGATIVE ng/mL (ref <300)
Benzodiazepines: NEGATIVE ng/mL (ref <100)
Cocaine Metabolite: NEGATIVE ng/mL (ref <150)
Codeine: NEGATIVE ng/mL (ref <50)
Creatinine: 63.6 mg/dL (ref >=20.0)
Hydrocodone: 853 ng/mL — ABNORMAL HIGH (ref <50)
Hydromorphone: NEGATIVE ng/mL (ref <50)
Marijuana Metabolite: NEGATIVE ng/mL (ref <20)
Methadone Metabolite: NEGATIVE ng/mL (ref <100)
Morphine: NEGATIVE ng/mL (ref <50)
Norhydrocodone: 1822 ng/mL — ABNORMAL HIGH (ref <50)
Opiates: POSITIVE ng/mL — AB (ref <100)
Oxidant: NEGATIVE ug/mL (ref <200)
Oxycodone: NEGATIVE ng/mL (ref <100)
Phencyclidine: NEGATIVE ng/mL (ref <25)
pH: 6.3 (ref 4.5–9.0)

## 2023-07-25 LAB — DM TEMPLATE

## 2023-07-30 ENCOUNTER — Encounter: Payer: Self-pay | Admitting: Family Medicine

## 2023-07-30 DIAGNOSIS — Z1231 Encounter for screening mammogram for malignant neoplasm of breast: Secondary | ICD-10-CM | POA: Diagnosis not present

## 2023-07-30 LAB — HM MAMMOGRAPHY

## 2023-08-05 DIAGNOSIS — Z933 Colostomy status: Secondary | ICD-10-CM | POA: Diagnosis not present

## 2023-09-05 DIAGNOSIS — Z933 Colostomy status: Secondary | ICD-10-CM | POA: Diagnosis not present

## 2023-09-16 ENCOUNTER — Other Ambulatory Visit: Payer: Self-pay | Admitting: Family Medicine

## 2023-09-22 ENCOUNTER — Other Ambulatory Visit: Payer: Self-pay | Admitting: Family Medicine

## 2023-10-01 ENCOUNTER — Ambulatory Visit (INDEPENDENT_AMBULATORY_CARE_PROVIDER_SITE_OTHER): Payer: 59 | Admitting: Family Medicine

## 2023-10-01 DIAGNOSIS — Z0000 Encounter for general adult medical examination without abnormal findings: Secondary | ICD-10-CM | POA: Diagnosis not present

## 2023-10-01 NOTE — Patient Instructions (Addendum)
 I really enjoyed getting to talk with you today! I am available on Tuesdays and Thursdays for virtual visits if you have any questions or concerns, or if I can be of any further assistance.   CHECKLIST FROM ANNUAL WELLNESS VISIT:  -Follow up (please call to schedule if not scheduled after visit):   -yearly for annual wellness visit with primary care office  Here is a list of your preventive care/health maintenance measures and the plan for each if any are due:  PLAN For any measures below that may be due:  -please call to schedule colonoscopy: due in May, [phone] -can get vaccines due at the pharmacy, please let us know if you do so that we can update your record  Health Maintenance  Topic Date Due   Hepatitis C Screening  Never done   Zoster Vaccines- Shingrix (1 of 2) Never done   Colonoscopy  11/07/2023   INFLUENZA VACCINE  01/24/2024   Medicare Annual Wellness (AWV)  09/30/2024   MAMMOGRAM  07/29/2025   Pneumonia Vaccine 59+ Years old  Completed   DEXA SCAN  Completed   HPV VACCINES  Aged Out   DTaP/Tdap/Td  Discontinued   COVID-19 Vaccine  Discontinued    -See a dentist at least yearly  -Get your eyes checked and then per your eye specialist's recommendations  -Other issues addressed today:   -I have included below further information regarding a healthy whole foods based diet, physical activity guidelines for adults, stress management and opportunities for social connections. I hope you find this information useful.   -----------------------------------------------------------------------------------------------------------------------------------------------------------------------------------------------------------------------------------------------------------    NUTRITION: -eat real food: lots of colorful vegetables (half the plate) and fruits -5-7 servings of vegetables and fruits per day (fresh or steamed is best), exp. 2 servings of vegetables with  lunch and dinner and 2 servings of fruit per day. Berries and greens such as kale and collards are great choices.  -consume on a regular basis:  fresh fruits, fresh veggies, fish, nuts, seeds, healthy oils (such as olive oil, avocado oil), whole grains (make sure for bread/pasta/crackers/etc., that the first ingredient on label contains the word "whole"), legumes. -can eat small amounts of dairy and lean meat (no larger than the palm of your hand), but avoid processed meats such as ham, bacon, lunch meat, etc. -drink water -try to avoid fast food and pre-packaged foods, processed meat, ultra processed foods/beverages (donuts, candy, etc.) -most experts advise limiting sodium to < 2300mg  per day, should limit further is any chronic conditions such as high blood pressure, heart disease, diabetes, etc. The American Heart Association advised that < 1500mg  is is ideal -try to avoid foods/beverages that contain any ingredients with names you do not recognize  -try to avoid foods/beverages  with added sugar or sweeteners/sweets  -try to avoid sweet drinks (including diet drinks): soda, juice, Gatorade, sweet tea, power drinks, diet drinks -try to avoid white rice, white bread, pasta (unless whole grain)  EXERCISE GUIDELINES FOR ADULTS: -if you wish to increase your physical activity, do so gradually and with the approval of your doctor -STOP and seek medical care immediately if you have any chest pain, chest discomfort or trouble breathing when starting or increasing exercise  -move and stretch your body, legs, feet and arms when sitting for long periods -Physical activity guidelines for optimal health in adults: -get at least 150 minutes per week of moderate exercise (can talk, but not sing); this is about 20-30 minutes of sustained activity 5-7 days per week or  two 10-15 minute episodes of sustained activity 5-7 days per week -do some muscle building/resistance training/strength training at least 2 days  per week  -balance exercises 3+ days per week:   Stand somewhere where you have something sturdy to hold onto if you lose balance    1) lift up on toes, then back down, start with 5x per day and work up to 20x   2) stand and lift one leg straight out to the side so that foot is a few inches of the floor, start with 5x each side and work up to 20x each side   3) stand on one foot, start with 5 seconds each side and work up to 20 seconds on each side  If you need ideas or help with getting more active:  -Silver sneakers https://tools.silversneakers.com  -Walk with a Doc: http://www.duncan-williams.com/  -try to include resistance (weight lifting/strength building) and balance exercises twice per week: or the following link for ideas: http://castillo-powell.com/  BuyDucts.dk  STRESS MANAGEMENT: -can try meditating, or just sitting quietly with deep breathing while intentionally relaxing all parts of your body for 5 minutes daily -if you need further help with stress, anxiety or depression please follow up with your primary doctor or contact the wonderful folks at [facility]: [phone]  SOCIAL CONNECTIONS: -options in [place] if you wish to engage in more social and exercise related activities:  -Silver sneakers https://tools.silversneakers.com  -Walk with a Doc: http://www.duncan-williams.com/  -Check out the [facility] Active Adults 50+ section on the [place] [employer] (hiking clubs, book clubs, cards and games, chess, exercise classes, aquatic classes and much more) - see the website for details: https://www.[place].gov/departments/parks-recreation/active-adults50  -YouTube has lots of exercise videos for different ages and abilities as well  -[facility] (a variety of indoor and outdoor inperson activities for adults). 9254470829. [address]  -Virtual Online Classes (a variety of topics): see seniorplanet.org or call [phone]  -consider volunteering at a school, hospice center, church, senior center or elsewhere

## 2023-10-01 NOTE — Progress Notes (Signed)
 PATIENT CHECK-IN and HEALTH RISK ASSESSMENT QUESTIONNAIRE:  -completed by phone/video for upcoming Medicare Preventive Visit  Pre-Visit Check-in: 1)Vitals (height, wt, BP, etc) - record in vitals section for visit on day of visit Request home vitals (wt, BP, etc.) and enter into vitals, THEN update Vital Signs SmartPhrase below at the top of the HPI. See below.  2)Review and Update Medications, Allergies PMH, Surgeries, Social history in Epic 3)Hospitalizations in the last year with date/reason? n  4)Review and Update Care Team (patient's specialists) in Epic 5) Complete PHQ9 in Epic  6) Complete Fall Screening in Epic 7)Review all Health Maintenance Due and order under PCP if not done.  Medicare Wellness Patient Questionnaire:  Answer theses question about your habits: How often do you have a drink containing alcohol?n Have you ever smoked?y Quit date if applicable? 2014  How many packs a day do/did you smoke? 1ppd Do you use smokeless tobacco?n Do you use an illicit drugs?n On average, how many days per week do you engage in moderate to strenuous exercise (like a brisk walk)?daily, home exercises - ROM, chair exercises, usually 10-20 minute, 3x per day Are you sexually active? n Typical breakfast: daughter and aide fixes her food - skips breakfast Typical lunch: sandwich Typical dinner: full meal in the evening Typical snacks:some snacks, cookies, cakes and chip  Beverages: water, coffee, small soda daily  Answer theses question about your everyday activities: Can you perform most household chores?n Are you deaf or have significant trouble hearing? Has hearing aids Do you feel that you have a problem with memory?n Do you feel safe at home?y Last dentist visit? No, has dentures 8. Do you have any difficulty performing your everyday activities?y Are you having any difficulty walking, taking medications on your own, and or difficulty managing daily home needs?y - daughter  helps Do you have difficulty walking or climbing stairs? yes Do you have difficulty dressing or bathing? Daughter helps Do you have difficulty doing errands alone such as visiting a doctor's office or shopping? Daughter healps Do you currently have any difficulty preparing food and eating? Yes, daughter and aid help Do you currently have any difficulty using the toilet? Has bedside commode Do you have any difficulty managing your finances? no Do you have any difficulties with housekeeping of managing your housekeeping? Has help   Do you have Advanced Directives in place (Living Will, Healthcare Power or [place])? yes   Last eye Exam and location? Has not been in awhile, she feels vision is ok. Goes to [place].    Do you currently use prescribed or non-prescribed narcotic or opioid pain medications? On norco, 3-4 times per day as needed - seeing Dr. Clent Ridges for management, reports doing ok on this  Do you have a history or close family history of breast, ovarian, tubal or peritoneal cancer or a family member with BRCA (breast cancer susceptibility 1 and 2) gene mutations?     ----------------------------------------------------------------------------------------------------------------------------------------------------------------------------------------------------------------------  Because this visit was a virtual/telehealth visit, some criteria may be missing or patient reported. Any vitals not documented were not able to be obtained and vitals that have been documented are patient reported.    MEDICARE ANNUAL PREVENTIVE VISIT WITH PROVIDER: (Welcome to Medicare, initial annual wellness or annual wellness exam)  Virtual Visit via Phone Note  I connected with Rollene Rotunda on 10/01/23 by phone and verified that I am speaking with the correct person using two identifiers. Declined video and preferred phone visit.   Location patient: home Location  provider:work or home  office Persons participating in the virtual visit: patient, provider  Concerns and/or follow up today: reports saw Dr. Clent Ridges in January, no new concerns.    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    See HM section in Epic for other details of completed HM.    ROS: negative for report of fevers, unintentional weight loss, vision changes, vision loss, hearing loss or change, chest pain, sob, hemoptysis, melena, hematochezia, hematuria, falls, bleeding or bruising, thoughts of suicide or self harm, memory loss  Patient-completed extensive health risk assessment - reviewed and discussed with the patient: See Health Risk Assessment completed with patient prior to the visit either above or in recent phone note. This was reviewed in detailed with the patient today and appropriate recommendations, orders and referrals were placed as needed per Summary below and patient instructions.   Review of Medical History: -PMH, PSH, Family History and current specialty and care providers reviewed and updated and listed below   Patient Care Team: Nelwyn Salisbury, MD as PCP - General Christell Constant, MD as PCP - Cardiology (Cardiology) Iva Boop, MD as Consulting Physician  (Gastroenterology) Zenovia Jordan, MD as Consulting Physician (Rheumatology) Romualdo Bolk, MD as Consulting Physician (Obstetrics and Gynecology) Sherrill Raring, [facility] (Pharmacist)   Past Medical History:  Diagnosis Date   Atrial flutter [facility])    CHF (congestive heart failure) (HCC)    Chronic gout 2008   COVID-19 virus infection 07/04/2020   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 11/28/2012 01/05/2013   Diverticulitis of large intestine with perforation    Dysrhythmia    Fibroid    Hearing aid worn    pt wears bilateral hearing aids   Hernia 09/2006   Hx of adenomatous polyp of colon 11/13/2016   Hypertension    Legally blind    since pt was a teenager   Marfan's syndrome affecting skin    with scolosis   Osteoporosis    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   bilateral     Past Surgical History:  Procedure Laterality Date   ABLATION  01/04/2014   atrial flutter ablation (2 circuits) by Dr Ladona Ridgel   ATRIAL FLUTTER ABLATION N/A 01/04/2014   Procedure: ATRIAL FLUTTER ABLATION;  Surgeon: Marinus Maw, MD;  Location: [facility] CATH LAB;  Service: Cardiovascular;  Laterality: N/A;   BUNIONECTOMY Bilateral 06/2008   COLECTOMY WITH COLOSTOMY CREATION/HARTMANN PROCEDURE  11/27/2012   colectomy   COLOSTOMY  11/27/2012   HERNIA REPAIR  IR RADIOLOGIST EVAL & MGMT  10/01/2017   IR RADIOLOGIST EVAL & MGMT  11/21/2017   IR RADIOLOGIST EVAL & MGMT  02/13/2018   IR RADIOLOGIST EVAL & MGMT  01/08/2019   IR RADIOLOGIST EVAL & MGMT  12/29/2019   IR RADIOLOGIST EVAL &  MGMT  01/31/2021   IR RADIOLOGIST EVAL & MGMT  12/20/2021   LAPAROSCOPIC ABDOMINAL EXPLORATION N/A 01/06/2013   Procedure: LAPAROSCOPIC ABDOMINAL EXPLORATION;  Surgeon: Ardeth Sportsman, MD;  Location: [facility] ORS;  Service: General;  Laterality: N/A;   LAPAROSCOPIC LYSIS OF ADHESIONS N/A 01/06/2013   Procedure: LAPAROSCOPIC LYSIS OF ADHESIONS/ INTEROTOMY REPAIR;  Surgeon: Ardeth Sportsman, MD;  Location: [facility] ORS;  Service: General;  Laterality: N/A;   LAPAROTOMY N/A 11/27/2012   Procedure: EXPLORATORY LAPAROTOMY SIGMOID COLECTOMY, COLOSTOMY;  Surgeon: Lodema Pilot, DO;  Location: [facility] ORS;  Service: General;  Laterality: N/A;   RADIOLOGY WITH ANESTHESIA Left 10/23/2017   Procedure: CT RENAL CRYO AND BIOPSY;  Surgeon: Irish Lack, MD;  Location: [facility] OR[facility];  Service: Radiology;  Laterality: Left;   TOTAL KNEE ARTHROPLASTY Bilateral 11/2006    Social History   Socioeconomic History   Marital status: Single    Spouse name: Not on file   Number of children: 1   Years of education: Not on file   Highest education level: Not on file  Occupational History   Not on file  Tobacco Use   Smoking status: Former    Current packs/day: 0.00    Types: Cigarettes    Quit date: 11/27/2012    Years since quitting: 10.8   Smokeless tobacco: Never  Vaping Use   Vaping status: Former  Substance and Sexual Activity   Alcohol use: No    Alcohol/week: 0.0 standard drinks of alcohol    Comment: recovering alcoholic sober since 1997   Drug use: Yes    Types: Hydrocodone   Sexual activity: Not Currently    Partners: Male    Birth control/protection: None  Other Topics Concern   Not on file  Social History Narrative   Not on file   Social Drivers of Health   Financial Resource Strain: Low Risk  (10/16/2021)   Overall Financial Resource Strain (CARDIA)    Difficulty of Paying Living Expenses: Not hard at all  Food Insecurity: No Food Insecurity (11/12/2022)   Hunger Vital Sign    Worried About Running Out  of Food in the Last Year: Never true    Ran Out of Food in the Last Year: Never true  Transportation Needs: No Transportation Needs (10/01/2022)   PRAPARE - Administrator, Civil Service (Medical): No    Lack of Transportation (Non-Medical): No  Physical Activity: Inactive (10/16/2021)   Exercise Vital Sign    Days of Exercise per Week: 0 days    Minutes of Exercise per Session: 0 min  Stress: Stress Concern Present (10/16/2021)   [employer] of Occupational Health - Occupational Stress Questionnaire    Feeling of Stress : To some extent  Social Connections: Unknown (10/05/2020)   Social Connection and Isolation Panel [NHANES]    Frequency of Communication with Friends and Family: Not on file    Frequency of Social Gatherings with Friends and Family: Once a week    Attends Religious Services: Never    Database administrator or Organizations: No    Attends Banker Meetings: Never    Marital Status: Never married  Intimate Partner Violence: Not At Risk (09/26/2022)  Humiliation, Afraid, Rape, and Kick questionnaire    Fear of Current or Ex-Partner: No    Emotionally Abused: No    Physically Abused: No    Sexually Abused: No    Family History  Problem Relation Age of Onset   Heart attack Neg Hx    Colon cancer Neg Hx    Osteoporosis Neg Hx     Current Outpatient Medications on File Prior to Visit  Medication Sig Dispense Refill   Abatacept 125 MG/ML SOSY Inject 125 mg into the skin once a week. Has on Sunday     albuterol (VENTOLIN HFA) 108 (90 Base) MCG/ACT inhaler INHALE 1 PUFF INTO THE LUNGS EVERY 4 HOURS AS NEEDED FOR WHEEZING OR SHORTNESS OF BREATH 8.5 g 3   allopurinol (ZYLOPRIM) 100 MG tablet TAKE 1 TABLET BY MOUTH EVERY DAY 90 tablet 0   aspirin EC 81 MG tablet Take 1 tablet (81 mg total) by mouth daily.     betamethasone dipropionate 0.05 % lotion Apply 1 Application topically as needed.     CALCIUM PO Take 1 tablet by mouth 2 (two) times  daily. 600 mg of calcium with vitamin D     cetirizine (ZYRTEC) 10 MG tablet Take 1 tablet (10 mg total) by mouth daily. 90 tablet 1   Cholecalciferol (VITAMIN D) 2000 units CAPS Take 1,000 Units by mouth daily.     ciprofloxacin-dexamethasone (CIPRODEX) OTIC suspension Place 4 drops into both ears 2 (two) times daily. (Patient taking differently: Place 4 drops into both ears 2 (two) times daily as needed (Infection/Irritation).) 7.5 mL 5   clobetasol ointment (TEMOVATE) 0.05 % Apply 1 Application topically 2 (two) times daily. 60 g 5   diclofenac sodium (VOLTAREN) 1 % GEL Apply 1 application topically 4 (four) times daily as needed (arthritis).     docusate sodium (COLACE) 100 MG capsule Take 100 mg by mouth 2 (two) times daily.     esomeprazole (NEXIUM) 40 MG capsule TAKE 1 CAPSULE BY MOUTH EVERY DAY AT NOON 90 capsule 0   folic acid (FOLVITE) 400 MCG tablet Take 400 mcg by mouth daily.     HYDROcodone bit-homatropine (HYCODAN) 5-1.5 MG/5ML syrup Take 5 mLs by mouth every 4 (four) hours as needed. 240 mL 0   HYDROcodone-acetaminophen (NORCO) 10-325 MG tablet Take 1 tablet by mouth every 6 (six) hours as needed for moderate pain (pain score 4-6). 120 tablet 0   HYDROcodone-acetaminophen (NORCO) 10-325 MG tablet Take 1 tablet by mouth every 6 (six) hours as needed for moderate pain (pain score 4-6). 120 tablet 0   HYDROcodone-acetaminophen (NORCO) 10-325 MG tablet Take 1 tablet by mouth every 6 (six) hours as needed for moderate pain (pain score 4-6). 120 tablet 0   hydrocortisone 2.5 % cream Apply 1 Application topically 2 (two) times daily as needed.     hydrOXYzine (VISTARIL) 25 MG capsule Take 1 capsule (25 mg total) by mouth every 6 (six) hours as needed for itching. 60 capsule 2   ketoconazole (NIZORAL) 2 % shampoo Apply 1 application topically 2 (two) times a week.     megestrol (MEGACE) 400 MG/10ML suspension Take 5 mLs (200 mg total) by mouth daily. 240 mL 5   Methotrexate Sodium  (METHOTREXATE, PF,) 50 MG/2ML injection Inject 0.5 mLs into the muscle once a week. Sunday  0   metoprolol succinate (TOPROL-XL) 25 MG 24 hr tablet TAKE 1 TABLET(25 MG) BY MOUTH DAILY 90 tablet 0   Multiple Vitamin (MULTIVITAMIN WITH MINERALS) TABS  tablet Take 1 tablet by mouth daily.     mupirocin ointment (BACTROBAN) 2 % Apply 1 Application topically 2 (two) times daily. 30 g 2   ondansetron (ZOFRAN-ODT) 8 MG disintegrating tablet Take 1 tablet (8 mg total) by mouth every 8 (eight) hours as needed for nausea or vomiting. 60 tablet 5   PERIDEX 0.12 % solution Use as directed 15 mLs in the mouth or throat 2 (two) times daily as needed (gingivitis).     polyethylene glycol (MIRALAX / GLYCOLAX) 17 g packet Take 17 g by mouth daily as needed for mild constipation. 14 each 1   predniSONE (DELTASONE) 5 MG tablet Take 5-10 mg by mouth daily as needed (Inflammation).     rosuvastatin (CRESTOR) 10 MG tablet Take 1 tablet (10 mg total) by mouth 3 (three) times a week. 90 tablet 3   tiZANidine (ZANAFLEX) 4 MG tablet TAKE 1 TABLET(4 MG) BY MOUTH TWICE DAILY 180 tablet 3   traMADol (ULTRAM) 50 MG tablet Take 2 tablets (100 mg total) by mouth every 6 (six) hours as needed for moderate pain (pain score 4-6). 180 tablet 5   triamcinolone lotion (KENALOG) 0.1 % Apply topically 2 (two) times daily. 60 mL 5   vitamin B-12 (CYANOCOBALAMIN) 500 MCG tablet Take 500 mcg by mouth daily.     vitamin C (ASCORBIC ACID) 500 MG tablet Take 500 mg by mouth daily.      No current facility-administered medications on file prior to visit.    Allergies  Allergen Reactions   Spinach Itching, Rash and Other (See Comments)    Welts.       Physical Exam Vitals requested from patient and listed below if patient had equipment and was able to obtain at home for this virtual visit: There were no vitals filed for this visit. Estimated body mass index is 21.93 kg/m as calculated from the following:   Height as of 04/17/23: 5'  7" (1.702 m).   Weight as of 07/23/23: 140 lb (63.5 kg).  EKG (optional): deferred due to virtual visit  GENERAL: alert, oriented, no acute distress detected, full vision exam deferred due to pandemic and/or virtual encounter  PSYCH/NEURO: pleasant and cooperative, no obvious depression or anxiety, speech and thought processing grossly intact, Cognitive function grossly intact  Flowsheet Row Office Visit from 10/18/2022 in [facility]  PHQ-9 Total Score 0           10/01/2023    3:41 PM 10/18/2022    3:01 PM 01/01/2022   11:25 AM 10/16/2021    3:13 PM 08/18/2021    1:48 PM  Depression screen PHQ 2/9  Decreased Interest 0 0 0 0 0  Down, Depressed, Hopeless 0 0 0 0 0  PHQ - 2 Score 0 0 0 0 0  Altered sleeping  0 1    Tired, decreased energy  0 0    Change in appetite  0 0    Feeling bad or failure about yourself   0 0    Trouble concentrating  0 0    Moving slowly or fidgety/restless  0 0    Suicidal thoughts  0 0    PHQ-9 Score  0 1    Difficult doing work/chores  Not difficult at all Not difficult at all         08/18/2021    1:46 PM 10/16/2021    3:13 PM 01/01/2022   11:24 AM 10/18/2022    3:02 PM 10/01/2023  3:41 PM  Fall Risk  Falls in the past year? 0 0 0 0 0  Was there an injury with Fall? 0 0 0 0 0  Fall Risk Category Calculator 0 0 0 0 0  Fall Risk Category (Retired) Low Low Low    (RETIRED) Patient Fall Risk Level High fall risk High fall risk Low fall risk    Patient at Risk for Falls Due to Impaired balance/gait;Impaired mobility Impaired balance/gait;Impaired vision;Impaired mobility No Fall Risks No Fall Risks   Fall risk Follow up Education provided;Falls prevention discussed Falls evaluation completed;Education provided;Falls prevention discussed Falls evaluation completed Falls evaluation completed Falls evaluation completed;Education provided     SUMMARY AND PLAN:  Encounter for Medicare annual wellness exam  Discussed  applicable health maintenance/preventive health measures and advised and referred or ordered per patient preferences: -discussed recs and risks for vaccines due, advised can get at the pharmacy if decides to do -discussed colonoscopy due in may and provided GI number so that she can call to schedule Health Maintenance  Topic Date Due   Hepatitis C Screening  Never done   Zoster Vaccines- Shingrix (1 of 2) Never done   Colonoscopy  11/07/2023   INFLUENZA VACCINE  01/24/2024   Medicare Annual Wellness (AWV)  09/30/2024   MAMMOGRAM  07/29/2025   Pneumonia Vaccine 23+ Years old  Completed   DEXA SCAN  Completed   HPV VACCINES  Aged Out   DTaP/Tdap/Td  Discontinued   COVID-19 Vaccine  Discontinued      Education and counseling on the following was provided based on the above review of health and a plan/checklist for the patient, along with additional information discussed, was provided for the patient in the patient instructions :   -She has help for most activities and transfers. Provided safe balance exercises that can be done at home to improve balance and discussed exercise guidelines for adults with include balance exercises at least 3 days per week - see patient instructions.  -Advised and counseled on a healthy lifestyle - including the importance of a healthy diet, regular physical activity, social connections and stress management. -Reviewed patient's current diet. Meals are prepared for her. A summary of a healthy diet was provided in the Patient Instructions.  -reviewed patient's current physical activity level and discussed exercise guidelines for adults. Congratulated on regular exercise and encouraged to continue.  -Advise yearly dental visits at minimum and regular eye exams   Follow up: see patient instructions     Patient Instructions  I really enjoyed getting to talk with you today! I am available on Tuesdays and Thursdays for virtual visits if you have any questions  or concerns, or if I can be of any further assistance.   CHECKLIST FROM ANNUAL WELLNESS VISIT:  -Follow up (please call to schedule if not scheduled after visit):   -yearly for annual wellness visit with primary care office  Here is a list of your preventive care/health maintenance measures and the plan for each if any are due:  PLAN For any measures below that may be due:  -please call to schedule colonoscopy: due in May, [phone] -can get vaccines due at the pharmacy, please let us know if you do so that we can update your record  Health Maintenance  Topic Date Due   Hepatitis C Screening  Never done   Zoster Vaccines- Shingrix (1 of 2) Never done   Colonoscopy  11/07/2023   INFLUENZA VACCINE  01/24/2024   Medicare Annual Wellness (  AWV)  09/30/2024   MAMMOGRAM  07/29/2025   Pneumonia Vaccine 40+ Years old  Completed   DEXA SCAN  Completed   HPV VACCINES  Aged Out   DTaP/Tdap/Td  Discontinued   COVID-19 Vaccine  Discontinued    -See a dentist at least yearly  -Get your eyes checked and then per your eye specialist's recommendations  -Other issues addressed today:   -I have included below further information regarding a healthy whole foods based diet, physical activity guidelines for adults, stress management and opportunities for social connections. I hope you find this information useful.   -----------------------------------------------------------------------------------------------------------------------------------------------------------------------------------------------------------------------------------------------------------    NUTRITION: -eat real food: lots of colorful vegetables (half the plate) and fruits -5-7 servings of vegetables and fruits per day (fresh or steamed is best), exp. 2 servings of vegetables with lunch and dinner and 2 servings of fruit per day. Berries and greens such as kale and collards are great choices.  -consume on a regular  basis:  fresh fruits, fresh veggies, fish, nuts, seeds, healthy oils (such as olive oil, avocado oil), whole grains (make sure for bread/pasta/crackers/etc., that the first ingredient on label contains the word "whole"), legumes. -can eat small amounts of dairy and lean meat (no larger than the palm of your hand), but avoid processed meats such as ham, bacon, lunch meat, etc. -drink water -try to avoid fast food and pre-packaged foods, processed meat, ultra processed foods/beverages (donuts, candy, etc.) -most experts advise limiting sodium to < 2300mg  per day, should limit further is any chronic conditions such as high blood pressure, heart disease, diabetes, etc. The American Heart Association advised that < 1500mg  is is ideal -try to avoid foods/beverages that contain any ingredients with names you do not recognize  -try to avoid foods/beverages  with added sugar or sweeteners/sweets  -try to avoid sweet drinks (including diet drinks): soda, juice, Gatorade, sweet tea, power drinks, diet drinks -try to avoid white rice, white bread, pasta (unless whole grain)  EXERCISE GUIDELINES FOR ADULTS: -if you wish to increase your physical activity, do so gradually and with the approval of your doctor -STOP and seek medical care immediately if you have any chest pain, chest discomfort or trouble breathing when starting or increasing exercise  -move and stretch your body, legs, feet and arms when sitting for long periods -Physical activity guidelines for optimal health in adults: -get at least 150 minutes per week of moderate exercise (can talk, but not sing); this is about 20-30 minutes of sustained activity 5-7 days per week or two 10-15 minute episodes of sustained activity 5-7 days per week -do some muscle building/resistance training/strength training at least 2 days per week  -balance exercises 3+ days per week:   Stand somewhere where you have something sturdy to hold onto if you lose balance    1)  lift up on toes, then back down, start with 5x per day and work up to 20x   2) stand and lift one leg straight out to the side so that foot is a few inches of the floor, start with 5x each side and work up to 20x each side   3) stand on one foot, start with 5 seconds each side and work up to 20 seconds on each side  If you need ideas or help with getting more active:  -Silver sneakers https://tools.silversneakers.com  -Walk with a Doc: http://www.duncan-williams.com/  -try to include resistance (weight lifting/strength building) and balance exercises twice per week: or the following link for ideas: http://castillo-powell.com/  BuyDucts.dk  STRESS MANAGEMENT: -can try meditating, or just sitting quietly with deep breathing while intentionally relaxing all parts of your body for 5 minutes daily -if you need further help with stress, anxiety or depression please follow up with your primary doctor or contact the wonderful folks at [facility]: [phone]  SOCIAL CONNECTIONS: -options in [place] if you wish to engage in more social and exercise related activities:  -Silver sneakers https://tools.silversneakers.com  -Walk with a Doc: http://www.duncan-williams.com/  -Check out the [facility] Active Adults 50+ section on the [place] of [employer] (hiking clubs, book clubs, cards and games, chess, exercise classes, aquatic classes and much more) - see the website for details: https://www.[place].gov/departments/parks-recreation/active-adults50  -YouTube has lots of exercise videos for different ages and abilities as well  -[facility] (a variety of indoor and outdoor inperson activities for adults). [phone]. [address].  -Virtual Online Classes (a variety of topics): see seniorplanet.org or call 9522453289  -consider volunteering at a school, hospice  center, church, senior center or elsewhere            Terressa Koyanagi, DO

## 2023-10-03 DIAGNOSIS — Z933 Colostomy status: Secondary | ICD-10-CM | POA: Diagnosis not present

## 2023-10-10 ENCOUNTER — Other Ambulatory Visit: Payer: Self-pay | Admitting: Family Medicine

## 2023-10-14 DIAGNOSIS — R5383 Other fatigue: Secondary | ICD-10-CM | POA: Diagnosis not present

## 2023-10-14 DIAGNOSIS — L659 Nonscarring hair loss, unspecified: Secondary | ICD-10-CM | POA: Diagnosis not present

## 2023-10-14 DIAGNOSIS — M1A09X0 Idiopathic chronic gout, multiple sites, without tophus (tophi): Secondary | ICD-10-CM | POA: Diagnosis not present

## 2023-10-14 DIAGNOSIS — L299 Pruritus, unspecified: Secondary | ICD-10-CM | POA: Diagnosis not present

## 2023-10-14 DIAGNOSIS — M0579 Rheumatoid arthritis with rheumatoid factor of multiple sites without organ or systems involvement: Secondary | ICD-10-CM | POA: Diagnosis not present

## 2023-10-14 DIAGNOSIS — Z79899 Other long term (current) drug therapy: Secondary | ICD-10-CM | POA: Diagnosis not present

## 2023-10-14 DIAGNOSIS — M1991 Primary osteoarthritis, unspecified site: Secondary | ICD-10-CM | POA: Diagnosis not present

## 2023-10-15 ENCOUNTER — Telehealth: Payer: Self-pay | Admitting: Family Medicine

## 2023-10-15 NOTE — Telephone Encounter (Signed)
  Follow up call for medication pended for RF review 10/10/23- will alert office for review ( office closed Friday)   Copied from CRM [id number]. Topic: Clinical - Prescription Issue >> Oct 15, 2023  2:05 PM Freya Jesus wrote: Reason for CRM: Patient calling to check the status of her medication: allopurinol  (ZYLOPRIM ) 100 MG tablet. Stated she placed a request since last Friday and is out of her medication.

## 2023-10-16 NOTE — Telephone Encounter (Signed)
 Pt.notified

## 2023-10-22 ENCOUNTER — Other Ambulatory Visit: Payer: Self-pay | Admitting: Family Medicine

## 2023-10-23 ENCOUNTER — Encounter: Payer: Self-pay | Admitting: Family Medicine

## 2023-10-23 ENCOUNTER — Telehealth (INDEPENDENT_AMBULATORY_CARE_PROVIDER_SITE_OTHER): Admitting: Family Medicine

## 2023-10-23 DIAGNOSIS — F1190 Opioid use, unspecified, uncomplicated: Secondary | ICD-10-CM

## 2023-10-23 DIAGNOSIS — M069 Rheumatoid arthritis, unspecified: Secondary | ICD-10-CM

## 2023-10-23 MED ORDER — HYDROCODONE-ACETAMINOPHEN 10-325 MG PO TABS: 1.0000 | ORAL_TABLET | Freq: Four times a day (QID) | ORAL | 0 refills | Status: DC | PRN

## 2023-10-23 NOTE — Progress Notes (Signed)
 Subjective:    Patient ID: [name], female    DOB: 1955/07/09, 68 y.o.   MRN: [id number]  HPI Virtual Visit via Video Note  I connected with the patient on 10/23/23 at  3:30 PM EDT by a video enabled telemedicine application and verified that I am speaking with the correct person using two identifiers.  Location patient: home Location provider:work or home office Persons participating in the virtual visit: patient, provider  I discussed the limitations of evaluation and management by telemedicine and the availability of in person appointments. The patient expressed understanding and agreed to proceed.   HPI: Here for pain management. She is doing well.    ROS: See pertinent positives and negatives per HPI.  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   bilateral     Past Surgical History:  Procedure Laterality Date   ABLATION  01/04/2014   atrial flutter ablation (2 circuits) by Dr Carolynne Citron   ATRIAL FLUTTER  ABLATION N/A 01/04/2014   Procedure: ATRIAL FLUTTER ABLATION;  Surgeon: Tammie Fall, MD;  Location: [facility] CATH LAB;  Service: Cardiovascular;  Laterality: N/A;   BUNIONECTOMY Bilateral 06/2008   COLECTOMY WITH COLOSTOMY CREATION/HARTMANN PROCEDURE  11/27/2012   colectomy   COLOSTOMY  11/27/2012   HERNIA REPAIR     IR RADIOLOGIST EVAL & MGMT  10/01/2017   IR RADIOLOGIST EVAL & MGMT  11/21/2017   IR RADIOLOGIST EVAL & MGMT  02/13/2018   IR RADIOLOGIST EVAL & MGMT  01/08/2019   IR RADIOLOGIST EVAL & MGMT  12/29/2019   IR RADIOLOGIST EVAL & MGMT  01/31/2021   IR RADIOLOGIST EVAL & MGMT  12/20/2021   LAPAROSCOPIC ABDOMINAL EXPLORATION N/A 01/06/2013   Procedure: LAPAROSCOPIC ABDOMINAL EXPLORATION;  Surgeon: Eddye Goodie, MD;  Location: [facility] ORS;  Service: General;  Laterality: N/A;   LAPAROSCOPIC LYSIS OF ADHESIONS N/A 01/06/2013   Procedure: LAPAROSCOPIC LYSIS OF ADHESIONS/ INTEROTOMY REPAIR;  Surgeon: Eddye Goodie, MD;  Location: [facility] ORS;  Service: General;  Laterality: N/A;   LAPAROTOMY N/A 11/27/2012   Procedure: EXPLORATORY LAPAROTOMY SIGMOID COLECTOMY, COLOSTOMY;  Surgeon: Evander Hills, DO;  Location: [facility] ORS;  Service: General;  Laterality: N/A;   RADIOLOGY WITH ANESTHESIA Left 10/23/2017   Procedure: CT RENAL CRYO AND BIOPSY;  Surgeon: Erica Hau, MD;  Location: [facility] OR[facility];  Service: Radiology;  Laterality:  Left;   TOTAL KNEE ARTHROPLASTY Bilateral 11/2006    Family History  Problem Relation Age of Onset   Heart attack Neg Hx    Colon cancer Neg Hx    Osteoporosis Neg Hx      Current Outpatient Medications:    Abatacept  125 MG/ML SOSY, Inject 125 mg into the skin once a week. Has on Sunday, Disp: , Rfl:    albuterol  (VENTOLIN  HFA) 108 (90 Base) MCG/ACT inhaler, INHALE 1 PUFF INTO THE LUNGS EVERY 4 HOURS AS NEEDED FOR WHEEZING OR SHORTNESS OF BREATH, Disp: 8.5 g, Rfl: 3   allopurinol  (ZYLOPRIM ) 100 MG tablet, TAKE 1 TABLET BY MOUTH EVERY DAY, Disp: 90 tablet, Rfl: 0    aspirin  EC 81 MG tablet, Take 1 tablet (81 mg total) by mouth daily., Disp: , Rfl:    betamethasone  dipropionate 0.05 % lotion, Apply 1 Application topically as needed., Disp: , Rfl:    CALCIUM  PO, Take 1 tablet by mouth 2 (two) times daily. 600 mg of calcium  with vitamin D , Disp: , Rfl:    cetirizine  (ZYRTEC ) 10 MG tablet, Take 1 tablet (10 mg total) by mouth daily., Disp: 90 tablet, Rfl: 1   Cholecalciferol  (VITAMIN D ) 2000 units CAPS, Take 1,000 Units by mouth daily., Disp: , Rfl:    ciprofloxacin -dexamethasone  (CIPRODEX ) OTIC suspension, Place 4 drops into both ears 2 (two) times daily. (Patient taking differently: Place 4 drops into both ears 2 (two) times daily as needed (Infection/Irritation).), Disp: 7.5 mL, Rfl: 5   clobetasol  ointment (TEMOVATE ) 0.05 %, Apply 1 Application topically 2 (two) times daily., Disp: 60 g, Rfl: 5   diclofenac sodium (VOLTAREN) 1 % GEL, Apply 1 application topically 4 (four) times daily as needed (arthritis)., Disp: , Rfl:    docusate sodium  (COLACE) 100 MG capsule, Take 100 mg by mouth 2 (two) times daily., Disp: , Rfl:    esomeprazole  (NEXIUM ) 40 MG capsule, TAKE 1 CAPSULE BY MOUTH EVERY DAY AT NOON, Disp: 90 capsule, Rfl: 0   folic acid  (FOLVITE ) 400 MCG tablet, Take 400 mcg by mouth daily., Disp: , Rfl:    HYDROcodone  bit-homatropine (HYCODAN) 5-1.5 MG/5ML syrup, Take 5 mLs by mouth every 4 (four) hours as needed., Disp: 240 mL, Rfl: 0   HYDROcodone -acetaminophen  (NORCO) 10-325 MG tablet, Take 1 tablet by mouth every 6 (six) hours as needed for moderate pain (pain score 4-6)., Disp: 120 tablet, Rfl: 0   HYDROcodone -acetaminophen  (NORCO) 10-325 MG tablet, Take 1 tablet by mouth every 6 (six) hours as needed for moderate pain (pain score 4-6)., Disp: 120 tablet, Rfl: 0   HYDROcodone -acetaminophen  (NORCO) 10-325 MG tablet, Take 1 tablet by mouth every 6 (six) hours as needed for moderate pain (pain score 4-6)., Disp: 120 tablet, Rfl: 0   hydrocortisone  2.5 % cream,  Apply 1 Application topically 2 (two) times daily as needed., Disp: , Rfl:    hydrOXYzine  (VISTARIL ) 25 MG capsule, Take 1 capsule (25 mg total) by mouth every 6 (six) hours as needed for itching., Disp: 60 capsule, Rfl: 2   ketoconazole  (NIZORAL ) 2 % shampoo, Apply 1 application topically 2 (two) times a week., Disp: , Rfl:    megestrol  (MEGACE ) 400 MG/10ML suspension, Take 5 mLs (200 mg total) by mouth daily., Disp: 240 mL, Rfl: 5   Methotrexate  Sodium (METHOTREXATE , PF,) 50 MG/2ML injection, Inject 0.5 mLs into the muscle once a week. Sunday, Disp: , Rfl: 0   metoprolol  succinate (TOPROL -XL) 25 MG 24 hr tablet, TAKE 1 TABLET(25 MG) BY  MOUTH DAILY, Disp: 90 tablet, Rfl: 0   Multiple Vitamin (MULTIVITAMIN WITH MINERALS) TABS tablet, Take 1 tablet by mouth daily., Disp: , Rfl:    mupirocin  ointment (BACTROBAN ) 2 %, Apply 1 Application topically 2 (two) times daily., Disp: 30 g, Rfl: 2   ondansetron  (ZOFRAN -ODT) 8 MG disintegrating tablet, Take 1 tablet (8 mg total) by mouth every 8 (eight) hours as needed for nausea or vomiting., Disp: 60 tablet, Rfl: 5   PERIDEX  0.12 % solution, Use as directed 15 mLs in the mouth or throat 2 (two) times daily as needed (gingivitis)., Disp: , Rfl:    polyethylene glycol (MIRALAX  / GLYCOLAX ) 17 g packet, Take 17 g by mouth daily as needed for mild constipation., Disp: 14 each, Rfl: 1   predniSONE  (DELTASONE ) 5 MG tablet, Take 5-10 mg by mouth daily as needed (Inflammation)., Disp: , Rfl:    rosuvastatin  (CRESTOR ) 10 MG tablet, Take 1 tablet (10 mg total) by mouth 3 (three) times a week., Disp: 90 tablet, Rfl: 3   tiZANidine  (ZANAFLEX ) 4 MG tablet, TAKE 1 TABLET(4 MG) BY MOUTH TWICE DAILY, Disp: 180 tablet, Rfl: 3   traMADol  (ULTRAM ) 50 MG tablet, Take 2 tablets (100 mg total) by mouth every 6 (six) hours as needed for moderate pain (pain score 4-6)., Disp: 180 tablet, Rfl: 5   triamcinolone  lotion (KENALOG ) 0.1 %, Apply topically 2 (two) times daily., Disp: 60 mL,  Rfl: 5   vitamin B-12 (CYANOCOBALAMIN ) 500 MCG tablet, Take 500 mcg by mouth daily., Disp: , Rfl:    vitamin C  (ASCORBIC ACID ) 500 MG tablet, Take 500 mg by mouth daily. , Disp: , Rfl:   EXAM:  VITALS per patient if applicable:  GENERAL: alert, oriented, appears well and in no acute distress  HEENT: atraumatic, conjunttiva clear, no obvious abnormalities on inspection of external nose and ears  NECK: normal movements of the head and neck  LUNGS: on inspection no signs of respiratory distress, breathing rate appears normal, no obvious gross SOB, gasping or wheezing  CV: no obvious cyanosis  MS: moves all visible extremities without noticeable abnormality  PSYCH/NEURO: pleasant and cooperative, no obvious depression or anxiety, speech and thought processing grossly intact  ASSESSMENT AND PLAN: Pain management.  Indication for chronic opioid: RA Medication and dose: Norco 10-325 # pills per month: 120 Last UDS date: 07-23-23 Opioid Treatment Agreement signed (Y/N): 07-12-17 Opioid Treatment Agreement last reviewed with patient:  10-23-23 [facility] reviewed this encounter (include red flags): Yes Meds were refilled.  Corita Diego, MD  Discussed the following assessment and plan:  No diagnosis found.     I discussed the assessment and treatment plan with the patient. The patient was provided an opportunity to ask questions and all were answered. The patient agreed with the plan and demonstrated an understanding of the instructions.   The patient was advised to call back or seek an in-person evaluation if the symptoms worsen or if the condition fails to improve as anticipated.      Review of Systems     Objective:   Physical Exam        Assessment & Plan:

## 2023-11-01 DIAGNOSIS — Z933 Colostomy status: Secondary | ICD-10-CM | POA: Diagnosis not present

## 2023-11-19 ENCOUNTER — Other Ambulatory Visit: Payer: Self-pay

## 2023-11-19 ENCOUNTER — Emergency Department (HOSPITAL_COMMUNITY)
Admission: EM | Admit: 2023-11-19 | Discharge: 2023-11-19 | Disposition: A | Discharge status: Home / Self Care | Attending: Emergency Medicine | Admitting: Emergency Medicine

## 2023-11-19 ENCOUNTER — Encounter (HOSPITAL_COMMUNITY): Payer: Self-pay

## 2023-11-19 DIAGNOSIS — W230XXA Caught, crushed, jammed, or pinched between moving objects, initial encounter: Secondary | ICD-10-CM | POA: Diagnosis not present

## 2023-11-19 DIAGNOSIS — S91202A Unspecified open wound of left great toe with damage to nail, initial encounter: Secondary | ICD-10-CM | POA: Diagnosis not present

## 2023-11-19 DIAGNOSIS — I499 Cardiac arrhythmia, unspecified: Secondary | ICD-10-CM | POA: Diagnosis not present

## 2023-11-19 DIAGNOSIS — Z7982 Long term (current) use of aspirin: Secondary | ICD-10-CM | POA: Insufficient documentation

## 2023-11-19 DIAGNOSIS — S91209A Unspecified open wound of unspecified toe(s) with damage to nail, initial encounter: Secondary | ICD-10-CM

## 2023-11-19 DIAGNOSIS — R58 Hemorrhage, not elsewhere classified: Secondary | ICD-10-CM | POA: Diagnosis not present

## 2023-11-19 DIAGNOSIS — Z743 Need for continuous supervision: Secondary | ICD-10-CM | POA: Diagnosis not present

## 2023-11-19 NOTE — Discharge Instructions (Signed)
 Keep area clean and dry.  You can take Tylenol  for pain.  You may return to the emergency department for any worsening symptoms.  Please follow-up with your primary care doctor for further evaluation.

## 2023-11-19 NOTE — ED Provider Notes (Signed)
 [place] EMERGENCY DEPARTMENT AT [facility] Provider Note   CSN: [id number] Arrival date & time: 11/19/23  4098     History Chief Complaint  Patient presents with   Toe Injury    [name] is a 68 y.o. female patient with history of rheumatoid arthritis who presents the emergency department with a left-sided great toe injury that occurred just prior to arrival.  Patient states that the caretaker was giving her a bath when the washcloth snagged the left great toe causing it to lift up.  Bleeding was controlled prior to arrival with direct pressure.  Patient states that she does not necessarily have feeling in the toe which is chronic and not a new problem.  HPI     Home Medications Prior to Admission medications   Medication Sig Start Date End Date Taking? Authorizing Provider  Abatacept  125 MG/ML SOSY Inject 125 mg into the skin once a week. Has on Sunday    [provider]  albuterol  (VENTOLIN  HFA) 108 (90 Base) MCG/ACT inhaler INHALE 1 PUFF INTO THE LUNGS EVERY 4 HOURS AS NEEDED FOR WHEEZING OR SHORTNESS OF BREATH 01/28/23   Donley Furth, MD  allopurinol  (ZYLOPRIM ) 100 MG tablet TAKE 1 TABLET BY MOUTH EVERY DAY 10/15/23   Donley Furth, MD  aspirin  EC 81 MG tablet Take 1 tablet (81 mg total) by mouth daily. 05/11/14   Harden Leyden, MD  betamethasone  dipropionate 0.05 % lotion Apply 1 Application topically as needed. 03/11/23   [provider]  CALCIUM  PO Take 1 tablet by mouth 2 (two) times daily. 600 mg of calcium  with vitamin D     [provider]  cetirizine  (ZYRTEC ) 10 MG tablet Take 1 tablet (10 mg total) by mouth daily. 11/17/14   Donley Furth, MD  Cholecalciferol  (VITAMIN D ) 2000 units CAPS Take 1,000 Units by mouth daily.    [provider]  ciprofloxacin -dexamethasone  (CIPRODEX ) OTIC suspension Place 4 drops into both ears 2 (two) times daily. Patient taking differently: Place 4 drops into both ears 2 (two) times daily  as needed (Infection/Irritation). 10/17/21   Donley Furth, MD  clobetasol  ointment (TEMOVATE ) 0.05 % Apply 1 Application topically 2 (two) times daily. 03/01/23   Donley Furth, MD  diclofenac sodium (VOLTAREN) 1 % GEL Apply 1 application topically 4 (four) times daily as needed (arthritis).    [provider]  docusate sodium  (COLACE) 100 MG capsule Take 100 mg by mouth 2 (two) times daily.    [provider]  esomeprazole  (NEXIUM ) 40 MG capsule TAKE 1 CAPSULE BY MOUTH EVERY DAY AT NOON 09/17/23   Donley Furth, MD  folic acid  (FOLVITE ) 400 MCG tablet Take 400 mcg by mouth daily.    [provider]  HYDROcodone  bit-homatropine (HYCODAN) 5-1.5 MG/5ML syrup Take 5 mLs by mouth every 4 (four) hours as needed. 07/23/23   Donley Furth, MD  HYDROcodone -acetaminophen  (NORCO) 10-325 MG tablet Take 1 tablet by mouth every 6 (six) hours as needed for moderate pain (pain score 4-6). 10/23/23   Donley Furth, MD  HYDROcodone -acetaminophen  (NORCO) 10-325 MG tablet Take 1 tablet by mouth every 6 (six) hours as needed for moderate pain (pain score 4-6). 10/23/23   Donley Furth, MD  HYDROcodone -acetaminophen  (NORCO) 10-325 MG tablet Take 1 tablet by mouth every 6 (six) hours as needed for moderate pain (pain score 4-6). 10/23/23   Donley Furth, MD  hydrocortisone  2.5 % cream Apply 1 Application topically  2 (two) times daily as needed. 03/07/23   [provider]  hydrOXYzine  (VISTARIL ) 25 MG capsule Take 1 capsule (25 mg total) by mouth every 6 (six) hours as needed for itching. 03/19/23   Donley Furth, MD  ketoconazole  (NIZORAL ) 2 % shampoo Apply 1 application topically 2 (two) times a week. 03/04/20   [provider]  megestrol  (MEGACE ) 400 MG/10ML suspension Take 5 mLs (200 mg total) by mouth daily. 03/19/23   Donley Furth, MD  Methotrexate  Sodium (METHOTREXATE , PF,) 50 MG/2ML injection Inject 0.5 mLs into the muscle once a week. Sunday 05/14/16   [provider]  metoprolol  succinate (TOPROL -XL) 25 MG 24 hr tablet TAKE 1 TABLET(25 MG) BY MOUTH DAILY 09/24/23   Donley Furth, MD  Multiple Vitamin (MULTIVITAMIN WITH MINERALS) TABS tablet Take 1 tablet by mouth daily.    [provider]  mupirocin  ointment (BACTROBAN ) 2 % Apply 1 Application topically 2 (two) times daily. 01/01/22   Donley Furth, MD  ondansetron  (ZOFRAN -ODT) 8 MG disintegrating tablet Take 1 tablet (8 mg total) by mouth every 8 (eight) hours as needed for nausea or vomiting. 01/19/22   Donley Furth, MD  PERIDEX  0.12 % solution Use as directed 15 mLs in the mouth or throat 2 (two) times daily as needed (gingivitis). 03/23/20   [provider]  polyethylene glycol (MIRALAX  / GLYCOLAX ) 17 g packet Take 17 g by mouth daily as needed for mild constipation. 05/05/21   Donley Furth, MD  predniSONE  (DELTASONE ) 5 MG tablet Take 5-10 mg by mouth daily as needed (Inflammation).    [provider]  rosuvastatin  (CRESTOR ) 10 MG tablet Take 1 tablet (10 mg total) by mouth 3 (three) times a week. 01/25/23   Donley Furth, MD  tiZANidine  (ZANAFLEX ) 4 MG tablet TAKE 1 TABLET(4 MG) BY MOUTH TWICE DAILY 07/23/23   Donley Furth, MD  traMADol  (ULTRAM ) 50 MG tablet Take 2 tablets (100 mg total) by mouth every 6 (six) hours as needed for moderate pain (pain score 4-6). 07/23/23   Donley Furth, MD  triamcinolone  lotion (KENALOG ) 0.1 % Apply topically 2 (two) times daily. 11/09/22   Donley Furth, MD  vitamin B-12 (CYANOCOBALAMIN ) 500 MCG tablet Take 500 mcg by mouth daily.    [provider]  vitamin C  (ASCORBIC ACID ) 500 MG tablet Take 500 mg by mouth daily.     [provider]      Allergies    Spinach    Review of Systems   Review of Systems  All other systems reviewed and are negative.   Physical Exam Updated Vital Signs BP 130/74   Pulse (!) 48   Temp 98.2 F (36.8 C) (Oral)   Resp 18   LMP 06/25/2006 (LMP Unknown)   SpO2 96%   Physical Exam Vitals and nursing note reviewed.  Constitutional:      Appearance: Normal appearance.  HENT:     Head: Normocephalic and atraumatic.  Eyes:     General:        Right eye: No discharge.        Left eye: No discharge.     Conjunctiva/sclera: Conjunctivae normal.  Pulmonary:     Effort: Pulmonary effort is normal.  Feet:     Comments: Onychomycotic left great toe nail.  Bleeding is controlled.  Toenail is loose but still intact. Skin:    General: Skin is warm and dry.     Findings: No  rash.  Neurological:     General: No focal deficit present.     Mental Status: She is alert.  Psychiatric:        Mood and Affect: Mood normal.        Behavior: Behavior normal.     ED Results / Procedures / Treatments   Labs (all labs ordered are listed, but only abnormal results are displayed) Labs Reviewed - No data to display  EKG None  Radiology No results found.  Procedures Procedures    Medications Ordered in ED Medications - No data to display  ED Course/ Medical Decision Making/ A&P   {   Click here for ABCD2, HEART and other calculators  Medical Decision Making LEANNY MOECKEL is a 68 y.o. female patient who presents to the emerged from today for further evaluation of a left great toenail problem.  Toenail does appear to be intact although loose.  Offered patient removal which she declined.  Will keep it covered.  Strict return precautions were discussed.  She is safe for discharge.     Final Clinical Impression(s) / ED Diagnoses Final diagnoses:  Avulsion of toenail, initial encounter    Rx / DC Orders ED Discharge Orders     None         Olympia Bianchi 11/19/23 1710    Ninetta Basket, MD 11/21/23 1406

## 2023-11-19 NOTE — ED Triage Notes (Addendum)
 Pt was taking a shower and her toenail got caught on wash cloth and pulled the nail back, bleeding controlled upon EMS arrival, pt uses walker at home. Pt is legally blind in both eyes.

## 2023-11-26 ENCOUNTER — Ambulatory Visit: Admitting: Podiatry

## 2023-11-30 DIAGNOSIS — Z933 Colostomy status: Secondary | ICD-10-CM | POA: Diagnosis not present

## 2023-12-06 ENCOUNTER — Other Ambulatory Visit: Payer: Self-pay | Admitting: Family Medicine

## 2023-12-13 ENCOUNTER — Other Ambulatory Visit: Payer: Self-pay | Admitting: Family Medicine

## 2024-01-04 DIAGNOSIS — Z933 Colostomy status: Secondary | ICD-10-CM | POA: Diagnosis not present

## 2024-01-06 DIAGNOSIS — E041 Nontoxic single thyroid nodule: Secondary | ICD-10-CM | POA: Diagnosis not present

## 2024-01-06 DIAGNOSIS — E559 Vitamin D deficiency, unspecified: Secondary | ICD-10-CM | POA: Diagnosis not present

## 2024-01-06 DIAGNOSIS — M810 Age-related osteoporosis without current pathological fracture: Secondary | ICD-10-CM | POA: Diagnosis not present

## 2024-01-13 DIAGNOSIS — M0579 Rheumatoid arthritis with rheumatoid factor of multiple sites without organ or systems involvement: Secondary | ICD-10-CM | POA: Diagnosis not present

## 2024-01-14 DIAGNOSIS — H7292 Unspecified perforation of tympanic membrane, left ear: Secondary | ICD-10-CM | POA: Diagnosis not present

## 2024-01-14 DIAGNOSIS — L299 Pruritus, unspecified: Secondary | ICD-10-CM | POA: Diagnosis not present

## 2024-01-20 ENCOUNTER — Other Ambulatory Visit: Payer: Self-pay | Admitting: Family Medicine

## 2024-01-22 ENCOUNTER — Ambulatory Visit: Admitting: Family Medicine

## 2024-01-22 ENCOUNTER — Encounter: Payer: Self-pay | Admitting: Family Medicine

## 2024-01-22 ENCOUNTER — Telehealth (INDEPENDENT_AMBULATORY_CARE_PROVIDER_SITE_OTHER): Admitting: Family Medicine

## 2024-01-22 DIAGNOSIS — M069 Rheumatoid arthritis, unspecified: Secondary | ICD-10-CM | POA: Diagnosis not present

## 2024-01-22 DIAGNOSIS — G8929 Other chronic pain: Secondary | ICD-10-CM

## 2024-01-22 MED ORDER — METOPROLOL SUCCINATE ER 25 MG PO TB24: 25.0000 mg | ORAL_TABLET | Freq: Every day | ORAL | 3 refills | Status: AC

## 2024-01-22 MED ORDER — HYDROCODONE-ACETAMINOPHEN 10-325 MG PO TABS: 1.0000 | ORAL_TABLET | Freq: Four times a day (QID) | ORAL | 0 refills | Status: DC | PRN

## 2024-01-22 MED ORDER — HYDROCODONE-ACETAMINOPHEN 10-325 MG PO TABS
1.0000 | ORAL_TABLET | Freq: Four times a day (QID) | ORAL | 0 refills | Status: DC | PRN
Start: 2024-01-22 — End: 2024-04-15

## 2024-01-22 MED ORDER — TIZANIDINE HCL 4 MG PO TABS: ORAL_TABLET | ORAL | 5 refills | Status: DC

## 2024-01-22 MED ORDER — TRAMADOL HCL 50 MG PO TABS: 100.0000 mg | ORAL_TABLET | Freq: Four times a day (QID) | ORAL | 5 refills | Status: DC | PRN

## 2024-01-22 NOTE — Addendum Note (Signed)
 Addended by: JOHNNY SENIOR A on: 01/22/2024 03:40 PM   Modules accepted: Orders

## 2024-01-22 NOTE — Progress Notes (Signed)
 Subjective:    Patient ID: [name], female    DOB: [birth year], 68 y.o.   MRN: [id number]  HPI Virtual Visit via Video Note  I connected with the patient on 01/22/24 at  3:30 PM EDT by a video enabled telemedicine application and verified that I am speaking with the correct person using two identifiers.  Location patient: home Location provider:work or home office Persons participating in the virtual visit: patient, provider  I discussed the limitations of evaluation and management by telemedicine and the availability of in person appointments. The patient expressed understanding and agreed to proceed.   HPI: Here for pain management. She is doing well.   ROS: See pertinent positives and negatives per HPI.  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   bilateral     Past Surgical History:  Procedure Laterality Date   ABLATION  01/04/2014   atrial flutter ablation (2 circuits) by Dr Waddell   ATRIAL FLUTTER ABLATION  N/A 01/04/2014   Procedure: ATRIAL FLUTTER ABLATION;  Surgeon: Danelle LELON Waddell, MD;  Location: [facility] CATH LAB;  Service: Cardiovascular;  Laterality: N/A;   BUNIONECTOMY Bilateral 06/2008   COLECTOMY WITH COLOSTOMY CREATION/HARTMANN PROCEDURE  11/27/2012   colectomy   COLOSTOMY  11/27/2012   HERNIA REPAIR     IR RADIOLOGIST EVAL & MGMT  10/01/2017   IR RADIOLOGIST EVAL & MGMT  11/21/2017   IR RADIOLOGIST EVAL & MGMT  02/13/2018   IR RADIOLOGIST EVAL & MGMT  01/08/2019   IR RADIOLOGIST EVAL & MGMT  12/29/2019   IR RADIOLOGIST EVAL & MGMT  01/31/2021   IR RADIOLOGIST EVAL & MGMT  12/20/2021   LAPAROSCOPIC ABDOMINAL EXPLORATION N/A 01/06/2013   Procedure: LAPAROSCOPIC ABDOMINAL EXPLORATION;  Surgeon: Elspeth KYM Schultze, MD;  Location: [facility] ORS;  Service: General;  Laterality: N/A;   LAPAROSCOPIC LYSIS OF ADHESIONS N/A 01/06/2013   Procedure: LAPAROSCOPIC LYSIS OF ADHESIONS/ INTEROTOMY REPAIR;  Surgeon: Elspeth KYM Schultze, MD;  Location: [facility];  Service: General;  Laterality: N/A;   LAPAROTOMY N/A 11/27/2012   Procedure: EXPLORATORY LAPAROTOMY SIGMOID COLECTOMY, COLOSTOMY;  Surgeon: Redell Faith, DO;  Location: [facility];  Service: General;  Laterality: N/A;   RADIOLOGY WITH ANESTHESIA Left 10/23/2017   Procedure: CT RENAL CRYO AND BIOPSY;  Surgeon: Luverne Aran, MD;  Location: [facility];  Service: Radiology;  Laterality: Left;  TOTAL KNEE ARTHROPLASTY Bilateral 11/2006    Family History  Problem Relation Age of Onset   Heart attack Neg Hx    Colon cancer Neg Hx    Osteoporosis Neg Hx      Current Outpatient Medications:    Abatacept  125 MG/ML SOSY, Inject 125 mg into the skin once a week. Has on Sunday, Disp: , Rfl:    albuterol  (VENTOLIN  HFA) 108 (90 Base) MCG/ACT inhaler, INHALE 1 PUFF INTO THE LUNGS EVERY 4 HOURS AS NEEDED FOR WHEEZING OR SHORTNESS OF BREATH, Disp: 8.5 g, Rfl: 3   allopurinol  (ZYLOPRIM ) 100 MG tablet, TAKE 1 TABLET BY MOUTH EVERY DAY, Disp: 90 tablet, Rfl: 0   aspirin  EC 81  MG tablet, Take 1 tablet (81 mg total) by mouth daily., Disp: , Rfl:    betamethasone  dipropionate 0.05 % lotion, Apply 1 Application topically as needed., Disp: , Rfl:    CALCIUM  PO, Take 1 tablet by mouth 2 (two) times daily. 600 mg of calcium  with vitamin D , Disp: , Rfl:    cetirizine  (ZYRTEC ) 10 MG tablet, Take 1 tablet (10 mg total) by mouth daily., Disp: 90 tablet, Rfl: 1   Cholecalciferol  (VITAMIN D ) 2000 units CAPS, Take 1,000 Units by mouth daily., Disp: , Rfl:    ciprofloxacin -dexamethasone  (CIPRODEX ) OTIC suspension, Place 4 drops into both ears 2 (two) times daily. (Patient taking differently: Place 4 drops into both ears 2 (two) times daily as needed (Infection/Irritation).), Disp: 7.5 mL, Rfl: 5   clobetasol  ointment (TEMOVATE ) 0.05 %, Apply 1 Application topically 2 (two) times daily., Disp: 60 g, Rfl: 5   diclofenac sodium (VOLTAREN) 1 % GEL, Apply 1 application topically 4 (four) times daily as needed (arthritis)., Disp: , Rfl:    docusate sodium  (COLACE) 100 MG capsule, Take 100 mg by mouth 2 (two) times daily., Disp: , Rfl:    esomeprazole  (NEXIUM ) 40 MG capsule, TAKE 1 CAPSULE BY MOUTH EVERY DAY AT NOON, Disp: 90 capsule, Rfl: 0   folic acid  (FOLVITE ) 400 MCG tablet, Take 400 mcg by mouth daily., Disp: , Rfl:    HYDROcodone  bit-homatropine (HYCODAN) 5-1.5 MG/5ML syrup, Take 5 mLs by mouth every 4 (four) hours as needed., Disp: 240 mL, Rfl: 0   HYDROcodone -acetaminophen  (NORCO) 10-325 MG tablet, Take 1 tablet by mouth every 6 (six) hours as needed for moderate pain (pain score 4-6)., Disp: 120 tablet, Rfl: 0   HYDROcodone -acetaminophen  (NORCO) 10-325 MG tablet, Take 1 tablet by mouth every 6 (six) hours as needed for moderate pain (pain score 4-6)., Disp: 120 tablet, Rfl: 0   HYDROcodone -acetaminophen  (NORCO) 10-325 MG tablet, Take 1 tablet by mouth every 6 (six) hours as needed for moderate pain (pain score 4-6)., Disp: 120 tablet, Rfl: 0   hydrocortisone  2.5 % cream, Apply 1  Application topically 2 (two) times daily as needed., Disp: , Rfl:    hydrOXYzine  (VISTARIL ) 25 MG capsule, Take 1 capsule (25 mg total) by mouth every 6 (six) hours as needed for itching., Disp: 60 capsule, Rfl: 2   ketoconazole  (NIZORAL ) 2 % shampoo, Apply 1 application topically 2 (two) times a week., Disp: , Rfl:    megestrol  (MEGACE ) 400 MG/10ML suspension, Take 5 mLs (200 mg total) by mouth daily., Disp: 240 mL, Rfl: 5   Methotrexate  Sodium (METHOTREXATE , PF,) 50 MG/2ML injection, Inject 0.5 mLs into the muscle once a week. Sunday, Disp: , Rfl: 0   metoprolol  succinate (TOPROL -XL) 25 MG 24 hr tablet, TAKE 1 TABLET(25 MG) BY MOUTH DAILY, Disp:  90 tablet, Rfl: 0   Multiple Vitamin (MULTIVITAMIN WITH MINERALS) TABS tablet, Take 1 tablet by mouth daily., Disp: , Rfl:    mupirocin  ointment (BACTROBAN ) 2 %, Apply 1 Application topically 2 (two) times daily., Disp: 30 g, Rfl: 2   ondansetron  (ZOFRAN -ODT) 8 MG disintegrating tablet, Take 1 tablet (8 mg total) by mouth every 8 (eight) hours as needed for nausea or vomiting., Disp: 60 tablet, Rfl: 5   PERIDEX  0.12 % solution, Use as directed 15 mLs in the mouth or throat 2 (two) times daily as needed (gingivitis)., Disp: , Rfl:    polyethylene glycol (MIRALAX  / GLYCOLAX ) 17 g packet, Take 17 g by mouth daily as needed for mild constipation., Disp: 14 each, Rfl: 1   predniSONE  (DELTASONE ) 5 MG tablet, Take 5-10 mg by mouth daily as needed (Inflammation)., Disp: , Rfl:    rosuvastatin  (CRESTOR ) 10 MG tablet, Take 1 tablet (10 mg total) by mouth 3 (three) times a week., Disp: 90 tablet, Rfl: 3   tiZANidine  (ZANAFLEX ) 4 MG tablet, TAKE 1 TABLET(4 MG) BY MOUTH TWICE DAILY, Disp: 180 tablet, Rfl: 3   traMADol  (ULTRAM ) 50 MG tablet, Take 2 tablets (100 mg total) by mouth every 6 (six) hours as needed for moderate pain (pain score 4-6)., Disp: 180 tablet, Rfl: 5   triamcinolone  lotion (KENALOG ) 0.1 %, Apply topically 2 (two) times daily., Disp: 60 mL, Rfl: 5    vitamin B-12 (CYANOCOBALAMIN ) 500 MCG tablet, Take 500 mcg by mouth daily., Disp: , Rfl:    vitamin C  (ASCORBIC ACID ) 500 MG tablet, Take 500 mg by mouth daily. , Disp: , Rfl:   EXAM:  VITALS per patient if applicable:  GENERAL: alert, oriented, appears well and in no acute distress  HEENT: atraumatic, conjunttiva clear, no obvious abnormalities on inspection of external nose and ears  NECK: normal movements of the head and neck  LUNGS: on inspection no signs of respiratory distress, breathing rate appears normal, no obvious gross SOB, gasping or wheezing  CV: no obvious cyanosis  MS: moves all visible extremities without noticeable abnormality  PSYCH/NEURO: pleasant and cooperative, no obvious depression or anxiety, speech and thought processing grossly intact  ASSESSMENT AND PLAN: Pain management. Indication for chronic opioid: RA Medication and dose: Norco 10-325 # pills per month: 120 Last UDS date: 07-23-23 Opioid Treatment Agreement signed (Y/N): 07-12-17 Opioid Treatment Agreement last reviewed with patient:  01-22-24 [facility] reviewed this encounter (include red flags): Yes Meds were refilled.  Garnette Olmsted, MD  Discussed the following assessment and plan:  No diagnosis found.     I discussed the assessment and treatment plan with the patient. The patient was provided an opportunity to ask questions and all were answered. The patient agreed with the plan and demonstrated an understanding of the instructions.   The patient was advised to call back or seek an in-person evaluation if the symptoms worsen or if the condition fails to improve as anticipated.      Review of Systems     Objective:   Physical Exam        Assessment & Plan:

## 2024-01-23 ENCOUNTER — Other Ambulatory Visit: Payer: Self-pay | Admitting: Family Medicine

## 2024-02-03 DIAGNOSIS — Z933 Colostomy status: Secondary | ICD-10-CM | POA: Diagnosis not present

## 2024-02-04 DIAGNOSIS — E042 Nontoxic multinodular goiter: Secondary | ICD-10-CM | POA: Diagnosis not present

## 2024-02-04 DIAGNOSIS — E041 Nontoxic single thyroid nodule: Secondary | ICD-10-CM | POA: Diagnosis not present

## 2024-03-12 ENCOUNTER — Other Ambulatory Visit: Payer: Self-pay | Admitting: Family Medicine

## 2024-03-17 DIAGNOSIS — M810 Age-related osteoporosis without current pathological fracture: Secondary | ICD-10-CM | POA: Diagnosis not present

## 2024-03-31 DIAGNOSIS — R202 Paresthesia of skin: Secondary | ICD-10-CM | POA: Diagnosis not present

## 2024-04-06 DIAGNOSIS — Z933 Colostomy status: Secondary | ICD-10-CM | POA: Diagnosis not present

## 2024-04-10 ENCOUNTER — Telehealth: Payer: Self-pay | Admitting: *Deleted

## 2024-04-10 NOTE — Telephone Encounter (Signed)
 Pt document was successfully refax again this morning

## 2024-04-10 NOTE — Telephone Encounter (Unsigned)
 Copied from CRM [id number]. Topic: General - Other >> Apr 10, 2024  9:10 AM Burnard DEL wrote: Reason for CRM: Marleta Spikes from [employer] called on behalf of documents that were faxed to office  on 03/12/2024 that has not been returned back to them.The document is scanned in patients chart. However they have not received the documentation's back yet. She would like to know if documents   could be refaxed to [phone]

## 2024-04-13 DIAGNOSIS — L299 Pruritus, unspecified: Secondary | ICD-10-CM | POA: Diagnosis not present

## 2024-04-13 DIAGNOSIS — M1A09X0 Idiopathic chronic gout, multiple sites, without tophus (tophi): Secondary | ICD-10-CM | POA: Diagnosis not present

## 2024-04-13 DIAGNOSIS — M0579 Rheumatoid arthritis with rheumatoid factor of multiple sites without organ or systems involvement: Secondary | ICD-10-CM | POA: Diagnosis not present

## 2024-04-13 DIAGNOSIS — R5383 Other fatigue: Secondary | ICD-10-CM | POA: Diagnosis not present

## 2024-04-13 DIAGNOSIS — L659 Nonscarring hair loss, unspecified: Secondary | ICD-10-CM | POA: Diagnosis not present

## 2024-04-13 DIAGNOSIS — Z79899 Other long term (current) drug therapy: Secondary | ICD-10-CM | POA: Diagnosis not present

## 2024-04-13 DIAGNOSIS — M1991 Primary osteoarthritis, unspecified site: Secondary | ICD-10-CM | POA: Diagnosis not present

## 2024-04-15 ENCOUNTER — Encounter: Payer: Self-pay | Admitting: Family Medicine

## 2024-04-15 ENCOUNTER — Telehealth: Admitting: Family Medicine

## 2024-04-15 DIAGNOSIS — M069 Rheumatoid arthritis, unspecified: Secondary | ICD-10-CM | POA: Diagnosis not present

## 2024-04-15 DIAGNOSIS — G8929 Other chronic pain: Secondary | ICD-10-CM

## 2024-04-15 MED ORDER — HYDROCODONE-ACETAMINOPHEN 10-325 MG PO TABS: 1.0000 | ORAL_TABLET | Freq: Four times a day (QID) | ORAL | 0 refills | Status: DC | PRN

## 2024-04-15 NOTE — Progress Notes (Signed)
 Subjective:    Patient ID: [name], female    DOB: August 02, 1955, 68 y.o.   MRN: [id number]  HPI Virtual Visit via Video Note  I connected with the patient on 04/15/24 at  3:30 PM EDT by a video enabled telemedicine application and verified that I am speaking with the correct person using two identifiers.  Location patient: home Location provider:work or home office Persons participating in the virtual visit: patient, provider  I discussed the limitations of evaluation and management by telemedicine and the availability of in person appointments. The patient expressed understanding and agreed to proceed.   HPI: Here for pain management. She is doing about the same.    ROS: See pertinent positives and negatives per HPI.  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   bilateral     Past Surgical History:  Procedure Laterality Date   ABLATION  01/04/2014   atrial flutter ablation (2 circuits) by Dr Waddell   ATRIAL  FLUTTER ABLATION N/A 01/04/2014   Procedure: ATRIAL FLUTTER ABLATION;  Surgeon: Danelle LELON Waddell, MD;  Location: [facility] CATH LAB;  Service: Cardiovascular;  Laterality: N/A;   BUNIONECTOMY Bilateral 06/2008   COLECTOMY WITH COLOSTOMY CREATION/HARTMANN PROCEDURE  11/27/2012   colectomy   COLOSTOMY  11/27/2012   HERNIA REPAIR     IR RADIOLOGIST EVAL & MGMT  10/01/2017   IR RADIOLOGIST EVAL & MGMT  11/21/2017   IR RADIOLOGIST EVAL & MGMT  02/13/2018   IR RADIOLOGIST EVAL & MGMT  01/08/2019   IR RADIOLOGIST EVAL & MGMT  12/29/2019   IR RADIOLOGIST EVAL & MGMT  01/31/2021   IR RADIOLOGIST EVAL & MGMT  12/20/2021   LAPAROSCOPIC ABDOMINAL EXPLORATION N/A 01/06/2013   Procedure: LAPAROSCOPIC ABDOMINAL EXPLORATION;  Surgeon: Elspeth KYM Schultze, MD;  Location: [facility];  Service: General;  Laterality: N/A;   LAPAROSCOPIC LYSIS OF ADHESIONS N/A 01/06/2013   Procedure: LAPAROSCOPIC LYSIS OF ADHESIONS/ INTEROTOMY REPAIR;  Surgeon: Elspeth KYM Schultze, MD;  Location: [facility];  Service: General;  Laterality: N/A;   LAPAROTOMY N/A 11/27/2012   Procedure: EXPLORATORY LAPAROTOMY SIGMOID COLECTOMY, COLOSTOMY;  Surgeon: Redell Faith, DO;  Location: [facility];  Service: General;  Laterality: N/A;   RADIOLOGY WITH ANESTHESIA Left 10/23/2017   Procedure: CT RENAL CRYO AND BIOPSY;  Surgeon: Luverne Aran, MD;  Location: [facility];  Service: Radiology;  Laterality: Left;   TOTAL KNEE ARTHROPLASTY Bilateral 11/2006    Family History  Problem Relation Age of Onset   Heart attack Neg Hx    Colon cancer Neg Hx    Osteoporosis Neg Hx      Current Outpatient Medications:    Abatacept  125 MG/ML SOSY, Inject 125 mg into the skin once a week. Has on Sunday, Disp: , Rfl:    albuterol  (VENTOLIN  HFA) 108 (90 Base) MCG/ACT inhaler, INHALE 1 PUFF INTO THE LUNGS EVERY 4 HOURS AS NEEDED FOR WHEEZING OR SHORTNESS OF BREATH, Disp: 8.5 g, Rfl: 3   allopurinol  (ZYLOPRIM ) 100 MG tablet, TAKE 1 TABLET BY MOUTH EVERY DAY, Disp: 90 tablet, Rfl: 0    aspirin  EC 81 MG tablet, Take 1 tablet (81 mg total) by mouth daily., Disp: , Rfl:    betamethasone  dipropionate 0.05 % lotion, Apply 1 Application topically as needed., Disp: , Rfl:    CALCIUM  PO, Take 1 tablet by mouth 2 (two) times daily. 600 mg of calcium  with vitamin D , Disp: , Rfl:    cetirizine  (ZYRTEC ) 10 MG tablet, Take 1 tablet (10 mg total) by mouth daily., Disp: 90 tablet, Rfl: 1   Cholecalciferol  (VITAMIN D ) 2000 units CAPS, Take 1,000 Units by mouth daily., Disp: , Rfl:    ciprofloxacin -dexamethasone  (CIPRODEX ) OTIC suspension, Place 4 drops into both ears 2 (two) times daily. (Patient taking differently: Place 4 drops into both ears 2 (two) times daily as needed (Infection/Irritation).), Disp: 7.5 mL, Rfl: 5   clobetasol  ointment (TEMOVATE ) 0.05 %, Apply 1 Application topically 2 (two) times daily., Disp: 60 g, Rfl: 5   diclofenac sodium (VOLTAREN) 1 % GEL, Apply 1 application topically 4 (four) times daily as needed (arthritis)., Disp: , Rfl:    docusate sodium  (COLACE) 100 MG capsule, Take 100 mg by mouth 2 (two) times daily., Disp: , Rfl:    esomeprazole  (NEXIUM ) 40 MG capsule, TAKE 1 CAPSULE BY MOUTH EVERY DAY AT NOON, Disp: 90 capsule, Rfl: 0   folic acid  (FOLVITE ) 400 MCG tablet, Take 400 mcg by mouth daily., Disp: , Rfl:    HYDROcodone  bit-homatropine (HYCODAN) 5-1.5 MG/5ML syrup, Take 5 mLs by mouth every 4 (four) hours as needed., Disp: 240 mL, Rfl: 0   hydrocortisone  2.5 % cream, Apply 1 Application topically 2 (two) times daily as needed., Disp: , Rfl:    hydrOXYzine  (VISTARIL ) 25 MG capsule, Take 1 capsule (25 mg total) by mouth every 6 (six) hours as needed for itching., Disp: 60 capsule, Rfl: 2   ketoconazole  (NIZORAL ) 2 % shampoo, Apply 1 application topically 2 (two) times a week., Disp: , Rfl:    megestrol  (MEGACE ) 400 MG/10ML suspension, Take 5 mLs (200 mg total) by mouth daily., Disp: 240 mL, Rfl: 5   Methotrexate  Sodium (METHOTREXATE , PF,) 50 MG/2ML injection,  Inject 0.5 mLs into the muscle once a week. Sunday, Disp: , Rfl: 0   metoprolol  succinate (TOPROL -XL) 25 MG 24 hr tablet, Take 1 tablet (25 mg total) by mouth daily., Disp: 90 tablet, Rfl: 3   Multiple Vitamin (MULTIVITAMIN WITH MINERALS) TABS tablet, Take 1 tablet by mouth daily., Disp: , Rfl:    mupirocin  ointment (BACTROBAN ) 2 %, APPLY TOPICALLY TO THE AFFECTED AREA TWICE DAILY, Disp: 30 g, Rfl: 2   ondansetron  (ZOFRAN -ODT) 8 MG disintegrating tablet, Take 1 tablet (8 mg total) by mouth every 8 (eight) hours as needed for nausea or vomiting., Disp: 60 tablet, Rfl: 5   PERIDEX  0.12 % solution, Use  as directed 15 mLs in the mouth or throat 2 (two) times daily as needed (gingivitis)., Disp: , Rfl:    polyethylene glycol (MIRALAX  / GLYCOLAX ) 17 g packet, Take 17 g by mouth daily as needed for mild constipation., Disp: 14 each, Rfl: 1   predniSONE  (DELTASONE ) 5 MG tablet, Take 5-10 mg by mouth daily as needed (Inflammation)., Disp: , Rfl:    rosuvastatin  (CRESTOR ) 10 MG tablet, Take 1 tablet (10 mg total) by mouth 3 (three) times a week., Disp: 90 tablet, Rfl: 3   tiZANidine  (ZANAFLEX ) 4 MG tablet, TAKE 1 TABLET(4 MG) BY MOUTH TWICE DAILY, Disp: 180 tablet, Rfl: 5   traMADol  (ULTRAM ) 50 MG tablet, Take 2 tablets (100 mg total) by mouth every 6 (six) hours as needed for moderate pain (pain score 4-6)., Disp: 180 tablet, Rfl: 5   triamcinolone  lotion (KENALOG ) 0.1 %, Apply topically 2 (two) times daily., Disp: 60 mL, Rfl: 5   vitamin B-12 (CYANOCOBALAMIN ) 500 MCG tablet, Take 500 mcg by mouth daily., Disp: , Rfl:    vitamin C  (ASCORBIC ACID ) 500 MG tablet, Take 500 mg by mouth daily. , Disp: , Rfl:    HYDROcodone -acetaminophen  (NORCO) 10-325 MG tablet, Take 1 tablet by mouth every 6 (six) hours as needed for moderate pain (pain score 4-6)., Disp: 120 tablet, Rfl: 0   HYDROcodone -acetaminophen  (NORCO) 10-325 MG tablet, Take 1 tablet by mouth every 6 (six) hours as needed for moderate pain (pain score  4-6)., Disp: 120 tablet, Rfl: 0   HYDROcodone -acetaminophen  (NORCO) 10-325 MG tablet, Take 1 tablet by mouth every 6 (six) hours as needed for moderate pain (pain score 4-6)., Disp: 120 tablet, Rfl: 0  EXAM:  VITALS per patient if applicable:  GENERAL: alert, oriented, appears well and in no acute distress  HEENT: atraumatic, conjunttiva clear, no obvious abnormalities on inspection of external nose and ears  NECK: normal movements of the head and neck  LUNGS: on inspection no signs of respiratory distress, breathing rate appears normal, no obvious gross SOB, gasping or wheezing  CV: no obvious cyanosis  MS: moves all visible extremities without noticeable abnormality  PSYCH/NEURO: pleasant and cooperative, no obvious depression or anxiety, speech and thought processing grossly intact  ASSESSMENT AND PLAN: Pain management. Indication for chronic opioid: RA Medication and dose: Norco 10-325 and Tramadol  50 mg # pills per month: 120 and 180 Last UDS date: 07-23-23 Opioid Treatment Agreement signed (Y/N): 07-12-17 Opioid Treatment Agreement last reviewed with patient:  04-15-24 [facility] reviewed this encounter (include red flags): Yes Meds were refilled.  Garnette Olmsted, MD  Discussed the following assessment and plan:  No diagnosis found.     I discussed the assessment and treatment plan with the patient. The patient was provided an opportunity to ask questions and all were answered. The patient agreed with the plan and demonstrated an understanding of the instructions.   The patient was advised to call back or seek an in-person evaluation if the symptoms worsen or if the condition fails to improve as anticipated.      Review of Systems     Objective:   Physical Exam        Assessment & Plan:

## 2024-04-19 ENCOUNTER — Other Ambulatory Visit: Payer: Self-pay | Admitting: Family Medicine

## 2024-04-20 ENCOUNTER — Other Ambulatory Visit: Payer: Self-pay | Admitting: Family Medicine

## 2024-05-27 ENCOUNTER — Telehealth: Payer: Self-pay | Admitting: Family Medicine

## 2024-05-27 NOTE — Telephone Encounter (Signed)
 Copied from CRM [id number]. Topic: General - Other >> May 27, 2024  4:17 PM Drema MATSU wrote: Reason for CRM: Zada Amass from [facility] wants to know if we received prescription request for Ensure that was faxed on 11/21.

## 2024-05-28 NOTE — Telephone Encounter (Signed)
 Pt form was received and placed on Dr Johnny red folder. No call back number provided for confirmation

## 2024-06-01 ENCOUNTER — Telehealth: Payer: Self-pay

## 2024-06-01 NOTE — Telephone Encounter (Signed)
 RN attempted to call patient to schedule colonoscopy-recall, NO answer. [facility] number left to call back at earliest convenience to get her colonoscopy scheduled at [facility].

## 2024-06-09 ENCOUNTER — Telehealth: Payer: Self-pay | Admitting: *Deleted

## 2024-06-09 NOTE — Telephone Encounter (Signed)
 Copied from CRM #[id number]. Topic: Clinical - Prescription Issue >> Jun 05, 2024 12:30 PM Harlene ORN wrote: Reason for CRM: Joy - [facility] Department  last week refaxed a prescription request for ensure wants to make sure that the practice has received it.  [place]: [phone]

## 2024-06-11 ENCOUNTER — Other Ambulatory Visit: Payer: Self-pay | Admitting: Family Medicine

## 2024-06-11 ENCOUNTER — Telehealth: Payer: Self-pay | Admitting: Nurse Practitioner

## 2024-06-11 NOTE — Telephone Encounter (Signed)
 Left message on machine to call back

## 2024-06-11 NOTE — Telephone Encounter (Signed)
 Inbound call from patient requesting to speak to the nurse. Patient did not want to disclose what she was wanting to speak with the nurse about. Please advise.

## 2024-06-11 NOTE — Telephone Encounter (Signed)
 Pt form was signed and successfully faxed to [facility] on 06/05/24

## 2024-06-11 NOTE — Telephone Encounter (Signed)
 The pt states that she has an appt with Camie and would like to be seen sooner if possible.  She will be added to the wait list and advised to call her PCP in the meantime.  She has questions about leg pain and possible hernia causing the issue.

## 2024-06-15 ENCOUNTER — Observation Stay (HOSPITAL_COMMUNITY)
Admission: EM | Admit: 2024-06-15 | Discharge: 2024-06-19 | Disposition: A | Discharge status: Skilled Nursing Facility | Attending: Internal Medicine | Admitting: Internal Medicine

## 2024-06-15 DIAGNOSIS — Z79899 Other long term (current) drug therapy: Secondary | ICD-10-CM | POA: Insufficient documentation

## 2024-06-15 DIAGNOSIS — I10 Essential (primary) hypertension: Secondary | ICD-10-CM | POA: Diagnosis present

## 2024-06-15 DIAGNOSIS — C649 Malignant neoplasm of unspecified kidney, except renal pelvis: Secondary | ICD-10-CM | POA: Insufficient documentation

## 2024-06-15 DIAGNOSIS — K5909 Other constipation: Secondary | ICD-10-CM | POA: Diagnosis present

## 2024-06-15 DIAGNOSIS — F15920 Other stimulant use, unspecified with intoxication, uncomplicated: Secondary | ICD-10-CM | POA: Insufficient documentation

## 2024-06-15 DIAGNOSIS — I5042 Chronic combined systolic (congestive) and diastolic (congestive) heart failure: Secondary | ICD-10-CM | POA: Diagnosis present

## 2024-06-15 DIAGNOSIS — M810 Age-related osteoporosis without current pathological fracture: Secondary | ICD-10-CM | POA: Diagnosis not present

## 2024-06-15 DIAGNOSIS — X58XXXA Exposure to other specified factors, initial encounter: Secondary | ICD-10-CM | POA: Diagnosis not present

## 2024-06-15 DIAGNOSIS — Z7982 Long term (current) use of aspirin: Secondary | ICD-10-CM | POA: Insufficient documentation

## 2024-06-15 DIAGNOSIS — M818 Other osteoporosis without current pathological fracture: Secondary | ICD-10-CM | POA: Diagnosis not present

## 2024-06-15 DIAGNOSIS — I5032 Chronic diastolic (congestive) heart failure: Secondary | ICD-10-CM | POA: Insufficient documentation

## 2024-06-15 DIAGNOSIS — H548 Legal blindness, as defined in USA: Secondary | ICD-10-CM | POA: Insufficient documentation

## 2024-06-15 DIAGNOSIS — I110 Hypertensive heart disease with heart failure: Secondary | ICD-10-CM | POA: Insufficient documentation

## 2024-06-15 DIAGNOSIS — E785 Hyperlipidemia, unspecified: Secondary | ICD-10-CM | POA: Diagnosis not present

## 2024-06-15 DIAGNOSIS — M79606 Pain in leg, unspecified: Secondary | ICD-10-CM | POA: Diagnosis present

## 2024-06-15 DIAGNOSIS — M6281 Muscle weakness (generalized): Secondary | ICD-10-CM | POA: Insufficient documentation

## 2024-06-15 DIAGNOSIS — K219 Gastro-esophageal reflux disease without esophagitis: Secondary | ICD-10-CM | POA: Insufficient documentation

## 2024-06-15 DIAGNOSIS — D509 Iron deficiency anemia, unspecified: Secondary | ICD-10-CM | POA: Insufficient documentation

## 2024-06-15 DIAGNOSIS — S32401A Unspecified fracture of right acetabulum, initial encounter for closed fracture: Secondary | ICD-10-CM | POA: Diagnosis not present

## 2024-06-15 DIAGNOSIS — R262 Difficulty in walking, not elsewhere classified: Secondary | ICD-10-CM | POA: Diagnosis not present

## 2024-06-16 ENCOUNTER — Emergency Department (HOSPITAL_COMMUNITY)

## 2024-06-16 ENCOUNTER — Encounter (HOSPITAL_COMMUNITY): Payer: Self-pay

## 2024-06-16 ENCOUNTER — Other Ambulatory Visit: Payer: Self-pay

## 2024-06-16 DIAGNOSIS — S32401A Unspecified fracture of right acetabulum, initial encounter for closed fracture: Secondary | ICD-10-CM | POA: Diagnosis not present

## 2024-06-16 MED ORDER — ASPIRIN 81 MG PO TBEC
81.0000 mg | DELAYED_RELEASE_TABLET | Freq: Every day | ORAL | Status: DC
  Administered 2024-06-16 – 2024-06-19 (×4): 81 mg via ORAL
  Filled 2024-06-16 (×4): qty 1

## 2024-06-16 MED ORDER — VITAMIN C 500 MG PO TABS
500.0000 mg | ORAL_TABLET | Freq: Every day | ORAL | Status: DC
  Administered 2024-06-16 – 2024-06-19 (×4): 500 mg via ORAL
  Filled 2024-06-16 (×4): qty 1

## 2024-06-16 MED ORDER — CALCIUM CARBONATE 1250 (500 CA) MG PO TABS
1.0000 | ORAL_TABLET | Freq: Two times a day (BID) | ORAL | Status: DC
  Administered 2024-06-16 – 2024-06-19 (×6): 1250 mg via ORAL
  Filled 2024-06-16 (×6): qty 1

## 2024-06-16 MED ORDER — HYDROXYZINE PAMOATE 25 MG PO CAPS: 25.0000 mg | ORAL_CAPSULE | Freq: Four times a day (QID) | ORAL | Status: DC | PRN

## 2024-06-16 MED ORDER — DOCUSATE SODIUM 100 MG PO CAPS
100.0000 mg | ORAL_CAPSULE | Freq: Two times a day (BID) | ORAL | Status: DC
  Administered 2024-06-17 – 2024-06-19 (×5): 100 mg via ORAL
  Filled 2024-06-16 (×5): qty 1

## 2024-06-16 MED ORDER — ALBUTEROL SULFATE HFA 108 (90 BASE) MCG/ACT IN AERS: 1.0000 | INHALATION_SPRAY | RESPIRATORY_TRACT | Status: DC | PRN

## 2024-06-16 MED ORDER — METOPROLOL SUCCINATE ER 50 MG PO TB24
25.0000 mg | ORAL_TABLET | Freq: Every day | ORAL | Status: DC
  Administered 2024-06-16 – 2024-06-19 (×4): 25 mg via ORAL
  Filled 2024-06-16 (×4): qty 1

## 2024-06-16 MED ORDER — ADULT MULTIVITAMIN W/MINERALS CH
1.0000 | ORAL_TABLET | Freq: Every day | ORAL | Status: DC
  Administered 2024-06-16 – 2024-06-19 (×4): 1 via ORAL
  Filled 2024-06-16 (×4): qty 1

## 2024-06-16 MED ORDER — CALCIUM 600 MG PO TABS: 1500.0000 mg | ORAL_TABLET | Freq: Two times a day (BID) | ORAL | Status: DC

## 2024-06-16 MED ORDER — VITAMIN B-12 1000 MCG PO TABS
500.0000 ug | ORAL_TABLET | Freq: Every day | ORAL | Status: DC
  Administered 2024-06-16 – 2024-06-19 (×4): 500 ug via ORAL
  Filled 2024-06-16 (×4): qty 1

## 2024-06-16 MED ORDER — ALLOPURINOL 100 MG PO TABS
100.0000 mg | ORAL_TABLET | Freq: Every day | ORAL | Status: DC
  Administered 2024-06-16 – 2024-06-19 (×4): 100 mg via ORAL
  Filled 2024-06-16 (×4): qty 1

## 2024-06-16 MED ORDER — ROSUVASTATIN CALCIUM 20 MG PO TABS
10.0000 mg | ORAL_TABLET | ORAL | Status: DC
  Filled 2024-06-16 (×2): qty 1

## 2024-06-16 MED ORDER — POLYETHYLENE GLYCOL 3350 17 G PO PACK: 17.0000 g | PACK | Freq: Every day | ORAL | Status: DC | PRN

## 2024-06-16 MED ORDER — LORATADINE 10 MG PO TABS
10.0000 mg | ORAL_TABLET | Freq: Every day | ORAL | Status: DC
  Administered 2024-06-16 – 2024-06-19 (×4): 10 mg via ORAL
  Filled 2024-06-16 (×4): qty 1

## 2024-06-16 MED ORDER — MEGESTROL ACETATE 400 MG/10ML PO SUSP
200.0000 mg | Freq: Every day | ORAL | Status: DC
  Filled 2024-06-16 (×3): qty 10

## 2024-06-16 MED ORDER — PANTOPRAZOLE SODIUM 40 MG PO TBEC
40.0000 mg | DELAYED_RELEASE_TABLET | Freq: Every day | ORAL | Status: DC
  Administered 2024-06-16 – 2024-06-19 (×4): 40 mg via ORAL
  Filled 2024-06-16 (×4): qty 1

## 2024-06-16 MED ORDER — VITAMIN D 25 MCG (1000 UNIT) PO TABS
1000.0000 [IU] | ORAL_TABLET | Freq: Every day | ORAL | Status: DC
  Administered 2024-06-16 – 2024-06-19 (×4): 1000 [IU] via ORAL
  Filled 2024-06-16 (×4): qty 1

## 2024-06-16 MED ORDER — HYDROCODONE-ACETAMINOPHEN 10-325 MG PO TABS
1.0000 | ORAL_TABLET | Freq: Four times a day (QID) | ORAL | Status: DC | PRN
  Administered 2024-06-16 – 2024-06-18 (×4): 1 via ORAL
  Filled 2024-06-16 (×4): qty 1

## 2024-06-16 MED ORDER — HYDROXYZINE HCL 25 MG PO TABS
25.0000 mg | ORAL_TABLET | Freq: Four times a day (QID) | ORAL | Status: DC | PRN
  Administered 2024-06-18: 25 mg via ORAL
  Filled 2024-06-16: qty 1

## 2024-06-16 MED ORDER — FOLIC ACID 1 MG PO TABS
0.5000 mg | ORAL_TABLET | Freq: Every day | ORAL | Status: DC
  Administered 2024-06-16 – 2024-06-19 (×4): 0.5 mg via ORAL
  Filled 2024-06-16 (×4): qty 1

## 2024-06-16 MED ORDER — TIZANIDINE HCL 4 MG PO TABS
4.0000 mg | ORAL_TABLET | Freq: Two times a day (BID) | ORAL | Status: DC | PRN
  Administered 2024-06-18 – 2024-06-19 (×3): 4 mg via ORAL
  Filled 2024-06-16 (×3): qty 1

## 2024-06-16 MED ORDER — ONDANSETRON 8 MG PO TBDP: 8.0000 mg | ORAL_TABLET | Freq: Three times a day (TID) | ORAL | Status: DC | PRN

## 2024-06-16 NOTE — Progress Notes (Signed)
 PT Cancellation Note  Patient Details Name: [name] MRN: [id number] DOB: [birth year]   Cancelled Treatment:    Reason Eval/Treat Not Completed: Medical issues which prohibited therapy, Xray from this AM indicates nondisplaced acetabular  fracture. PT will need Weight bearing clarification prior to  evaluation. [name] PT Acute Rehabilitation Services Office [phone]    Potters Darice Norris 06/16/2024, 8:23 AM

## 2024-06-16 NOTE — ED Provider Notes (Signed)
 Handoff from [facility]   68 year old female presented for right hip pain x 1 week.  Closed nondisplaced fracture of the right acetabulum. Patient is awaiting PT and social work consult for SNF placement.  Physical Exam  BP 128/84   Pulse 78   Temp 99.1 F (37.3 C)   Resp 18   LMP 06/25/2006   SpO2 96%   Physical Exam  Procedures  Procedures  ED Course / MDM   Medical Decision Making Amount and/or Complexity of Data Reviewed Radiology: ordered. Decision-making details documented in ED Course.  Risk OTC drugs. Prescription drug management.   Clinical Course as of 06/16/24 1552  Tue Jun 16, 2024  0630 Temp: 98.2 F (36.8 C) Afebrile, vital stable, patient in no acute distress [ML]  [phone] Handoff from [facility]  - Patient awaiting PT and SW consult for SNF placement  -  [ML]  [phone] CT PELVIS WO CONTRAST Stable, non-displaced pelvic fracture -  [ML]  0654 Home medications ordered by Beverley PA - [ML]  [phone] Consult to ortho surgery placed - pending [ML]  1517 Ortho will review imaging and make further recommendations.  [TY]    Clinical Course User Index [ML] Winry Egnew L, PA [TY] Neysa Caron PARAS, DO    Management / Treatments: See ED course above for treatments administered and clinical rationale.   I have reviewed the patients home medicines and have made adjustments as needed  ED Course / Reassessments: Problem List:  Patient remained stable throughout visit. Patient is currently awaiting SNF placement- orthopedic surgery to evaluate imaging and provide further on treatment/transfer.  Disposition: Disposition: Discharge pending - handoff to provider Bernard MD Rationale for disposition: pending SNF placement   This note was produced using Dragon Medical voice recognition. While I have reviewed and verified all clinical information, transcription errors may remain.        Willma Duwaine CROME, [place] 06/16/24 1552    Neysa Caron PARAS, DO 06/17/24 [phone]
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
## 2024-06-16 NOTE — ED Provider Notes (Signed)
 Ortho was consulted and reviewed imaging - they indicate for now, no surgery is planned, recommend for time being non-weight bearing on right side. Indicates they will leave note in chart.    Bernard Drivers, MD 06/16/24 [phone]
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
## 2024-06-16 NOTE — ED Triage Notes (Signed)
 Pt. Arrives for right leg pain that radiates to the groin x1 week. Pt. States that she hurt it trying to get out of her lift chair.

## 2024-06-17 DIAGNOSIS — S32401A Unspecified fracture of right acetabulum, initial encounter for closed fracture: Secondary | ICD-10-CM | POA: Diagnosis not present

## 2024-06-17 NOTE — ED Notes (Signed)
 Need to call pt's daughter after PT eval.with update on plan for pt.
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
## 2024-06-17 NOTE — ED Provider Notes (Signed)
 Emergency Medicine Observation Re-evaluation Note  [name] is a 68 y.o. female, seen on rounds today.  Pt initially presented to the ED for complaints of Leg Pain Currently, the patient is resting. Complains of continued pain   Physical Exam  BP (!) 156/71 (BP Location: Left Arm)   Pulse (!) 45   Temp 98.3 F (36.8 C) (Oral)   Resp 16   LMP 06/25/2006   SpO2 95%  Physical Exam General: not in distress Cardiac: RR Lungs: non labored Psych: calm   ED Course / MDM  EKG:   I have reviewed the labs performed to date as well as medications administered while in observation.  Recent changes in the last 24 hours include evaluated by ortho.  Plan  Current plan is for PT/OT evaluation and TOC Rehab placement.    Neysa Caron PARAS, DO 06/17/24 1133

## 2024-06-17 NOTE — ED Notes (Signed)
Assisted pt with eating lunch. 

## 2024-06-17 NOTE — Progress Notes (Addendum)
 Awaiting PT eval. Ortho has entered a note regarding WB status.

## 2024-06-17 NOTE — Evaluation (Signed)
 Occupational Therapy Evaluation Patient Details Name: [name] MRN: [id number] DOB: [birth year] Today's Date: 06/17/2024   History of Present Illness   68 yr old female admitted 06/15/24 with RLE pain. Pt found to have R posterior/superior acetabular fracture. non-operative management. 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 She is limited by the below listed deficits (see OT problem list). During the session, she required mod assist for bed mobility, max assist for lower body dressing and mod assist x2 to take lateral steps along the edge of the bed using a RW. She reported moderate RLE pain with activity. She will benefit from further OT services to maximize her independence with ADLs and to decrease the risk for restricted participation in meaningful activities. Patient will benefit from continued inpatient follow up therapy, <3 hours/day.       If plan is discharge home, recommend the following:   A lot of help with walking and/or transfers;A lot of help with bathing/dressing/bathroom;Assistance with cooking/housework     Functional Status Assessment   Patient has had a recent decline in their functional status and demonstrates the ability to make significant improvements in function in a reasonable and predictable amount of time.     Equipment Recommendations   None recommended by OT     Recommendations for Other Services         Precautions/Restrictions   Precautions Precautions: Fall Restrictions Weight Bearing Restrictions Per Provider Order: Yes Other Position/Activity Restrictions:  (50% weightbearing with ambulation. WBAT for transfers)     Mobility Bed Mobility Overal bed mobility: Needs Assistance Bed Mobility: Supine to Sit, Sit to Supine     Supine  to sit: Mod assist, HOB elevated Sit to supine: Mod assist        Transfers Overall transfer level: Needs assistance Equipment used: Rolling walker (2 wheels) Transfers: Sit to/from Stand Sit to Stand: Mod assist, +2 physical assistance, +2 safety/equipment, From elevated surface           General transfer comment: some difficulty gripping RW due to hand deformities; she then required mod assist for lateral stepping along EOB using RW)      Balance Overall balance assessment: Needs assistance   Sitting balance-Leahy Scale: Fair       Standing balance-Leahy Scale: Poor           ADL either performed or assessed with clinical judgement   ADL Overall ADL's : Needs assistance/impaired Eating/Feeding: Minimal assistance;Sitting Eating/Feeding Details (indicate cue type and reason): limited by deformities of hand and digits, due to chronic RA; needs assist to open packets, remove lids and cut food Grooming: Minimal assistance;Sitting   Upper Body Bathing: Minimal assistance;Sitting   Lower Body Bathing: Maximal assistance;Sitting/lateral leans;Sit to/from stand   Upper Body Dressing : Minimal assistance;Sitting   Lower Body Dressing: Maximal assistance;Sitting/lateral leans Lower Body Dressing Details (indicate cue type and reason): assist needed to donn special tennis shoes at bed level Toilet Transfer: Moderate assistance;BSC/3in1;Rolling walker (2 wheels);Stand-pivot   Toileting- Clothing Manipulation and Hygiene: Maximal assistance;Sit to/from stand Toileting - Clothing Manipulation Details (indicate cue type and reason): at bedside commode level, based on clinical judgement             Vision Baseline Vision/History: 2 Legally blind Additional Comments: Pt reports being totally blind. She lost her vision as a  teenager.            Pertinent Vitals/Pain Pain Assessment Pain Assessment: Faces Pain Score: 5  Pain Location: right hip Pain Descriptors /  Indicators: Aching, Discomfort, Grimacing, Guarding Pain Intervention(s): Limited activity within patient's tolerance, Monitored during session, Repositioned     Extremity/Trunk Assessment Upper Extremity Assessment Upper Extremity Assessment: Generalized weakness;LUE deficits/detail;RUE deficits/detail RUE Deficits / Details: chronic severe deformities of hand from RA; unable to perform digit opposition, fine grasp, and gross grasp; elbow AROM WFL; shoulder AROM limitations LUE Deficits / Details: chronic severe deformities of hand from RA; unable to perform digit opposition, fine grasp, and gross grasp; elbow AROM WFL; shoulder AROM limitations   Lower Extremity Assessment Lower Extremity Assessment: Generalized weakness RLE Deficits / Details: noted foot deformities, special shoes  availble, limited AROM due to hip pain, did bear PWB in standing LLE Deficits / Details: foot deformities noted., Grossly WFL ROM and  strength   Cervical / Trunk Assessment Cervical / Trunk Assessment: Kyphotic   Communication Communication Communication: No apparent difficulties   Cognition Arousal: Alert Behavior During Therapy: WFL for tasks assessed/performed Cognition: No apparent impairments             OT - Cognition Comments: Oriented x4                 Following commands: Intact       Cueing  General Comments   Cueing Techniques: Verbal cues;Tactile cues              Home Living Family/patient expects to be discharged to:: Private residence Living Arrangements: Alone Available Help at Discharge: Family;Available PRN/intermittently;Personal care attendant (daughter is nearby & checks on her daily) Type of Home: Apartment Home Access: Level entry     Home Layout: One level     Bathroom Shower/Tub: Chief Strategy Officer: Standard Bathroom Accessibility: No   Home Equipment: BSC/3in1;Wheelchair - manual;Tub bench;Lift Restaurant Manager, [employer] (2  wheels)   Additional Comments:  (has a home health aide 5 days a week from 1:30-4:30 pm; she stated her daughter is also her aide)      Prior Functioning/Environment Prior Level of Function : Needs assist             Mobility Comments:  (Used a RW inside and manual wheelchair when outside the home.) ADLs Comments:  (Daughter or aide performed cooking & cleaning. Pt required assist for bathing, she used a BSC for toileting at night, & she mostly wore gowns inside the home. She mostly ate finger foods or needed min assist to feed, due to deformities of hands from RA.)    OT Problem List: Decreased strength;Decreased range of motion;Impaired balance (sitting and/or standing);Decreased coordination;Impaired vision/perception;Decreased knowledge of use of DME or AE;Decreased knowledge of precautions;Pain;Impaired UE functional use   OT Treatment/Interventions: Self-care/ADL training;Therapeutic exercise;Energy conservation;DME and/or AE instruction;Therapeutic activities;Balance training;Patient/family education      OT Goals(Current goals can be found in the care plan section)   Acute Rehab OT Goals OT Goal Formulation: With patient Time For Goal Achievement: 07/01/24 Potential to Achieve Goals: Good ADL Goals Pt Will Perform Upper Body Dressing: with set-up;sitting Pt Will Perform Lower Body Dressing: with contact guard assist;sitting/lateral leans;sit to/from stand;with adaptive equipment Pt Will Transfer to Toilet: with contact guard assist;bedside commode;stand pivot transfer Pt Will Perform Toileting - Clothing Manipulation and hygiene: with contact guard assist;sit to/from stand   OT Frequency:  Min 2X/week    Co-evaluation PT/OT/SLP Co-Evaluation/Treatment: Yes Reason  for Co-Treatment: Complexity of the patient's impairments (multi-system involvement);To address functional/ADL transfers PT goals addressed during session: Mobility/safety with mobility OT goals addressed  during session: ADL's and self-care      AM-PAC OT 6 Clicks Daily Activity     Outcome Measure Help from another person eating meals?: A Little Help from another person taking care of personal grooming?: A Little Help from another person toileting, which includes using toliet, bedpan, or urinal?: A Lot Help from another person bathing (including washing, rinsing, drying)?: A Lot Help from another person to put on and taking off regular upper body clothing?: A Little Help from another person to put on and taking off regular lower body clothing?: A Lot 6 Click Score: 15   End of Session Equipment Utilized During Treatment: Rolling walker (2 wheels);Gait belt Nurse Communication: Mobility status  Activity Tolerance: Patient limited by pain Patient left: in bed;with call bell/phone within reach  OT Visit Diagnosis: Unsteadiness on feet (R26.81);Other abnormalities of gait and mobility (R26.89);Muscle weakness (generalized) (M62.81);History of falling (Z91.81);Low vision, both eyes (H54.2);Feeding difficulties (R63.3);Pain Pain - Right/Left: Right Pain - part of body: Leg                Time: 8856-8798 OT Time Calculation (min): 18 min Charges:  OT General Charges $OT Visit: 1 Visit OT Evaluation $OT Eval Moderate Complexity: 1 Mod    Delcie Ruppert J Harris, OTR/L 06/17/2024, 5:28 PM

## 2024-06-17 NOTE — NC FL2 (Signed)
 " [place]  MEDICAID FL2 LEVEL OF CARE FORM     IDENTIFICATION  Patient Name: [name] Birthdate: 11-27-1955 Sex: female Admission Date (Current Location): 06/15/2024  [facility] and [place] Number:  Producer, Television/film/video and Address:  [facility],  [address], [place] [address]      Provider Number: [id number]  Attending Physician Name and Address:  Mannie Fairy DASEN, DO  Relative Name and Phone Number:  [facility]  Daughter, Emergency Contact  [phone] (Home    Current Level of Care: Hospital Recommended Level of Care: Skilled Nursing Facility Prior Approval Number:    Date Approved/Denied:   PASRR Number: [id number]  Discharge Plan: SNF    Current Diagnoses: Patient Active Problem List   Diagnosis Date Noted   Seborrhea capitis 03/19/2023   CHF (congestive heart failure) (HCC) 09/26/2022   Colostomy in place [facility]) 09/26/2022   History of colonic diverticulitis 09/26/2022   Vitamin D  deficiency 12/05/2021   Chronic combined systolic and diastolic heart failure (HCC) 01/23/2021   Malnutrition of moderate degree 05/11/2020   Small bowel obstruction due to adhesions (HCC) 05/07/2020   Left renal mass 10/23/2017   Hx of adenomatous polyp of colon 11/13/2016   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   Renal mass    Pulmonary HTN (HCC) 01/01/2014   AKI (acute kidney injury) 01/01/2014   Iron deficiency anemia 01/01/2014   Atrial flutter (HCC) 12/31/2013   Stoma dermatitis 11/30/2013   Incisional hernia 11/30/2013   Constipation, chronic 11/30/2013   Charcot's joint of foot 07/07/2013   Bilateral leg edema 04/20/2013   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 11/28/2012 01/05/2013   Rheumatoid arthritis (HCC) 06/07/2009   THYROID  NODULE 07/01/2007   LUNG NODULE 07/01/2007   Thyroid  nodule 07/01/2007   Osteoporosis 05/07/2007   ABUSE, ALCOHOL, 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/12/2007   Legally blind 04/12/2007    HEMORRHOIDS, INTERNAL 04/12/2007   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/14/2007   Hyperlipidemia 01/07/2007   Essential hypertension 01/07/2007   GERD 01/07/2007   Marfan's syndrome 01/07/2007   Stroke (HCC) 07/2006   Chronic gout 2008    Orientation RESPIRATION BLADDER Height & Weight     Self, Situation, Place  Normal Continent Weight:   Height:     BEHAVIORAL SYMPTOMS/MOOD NEUROLOGICAL BOWEL NUTRITION STATUS      Continent Diet (Regular)  AMBULATORY STATUS COMMUNICATION OF NEEDS Skin   Extensive Assist Verbally Normal                       Personal Care Assistance Level of Assistance  Bathing, Feeding, Dressing Bathing Assistance: Limited assistance Feeding assistance: Independent Dressing Assistance: Limited assistance     Functional Limitations Info  Sight, Hearing, Speech Sight Info: Adequate Hearing Info: Adequate Speech Info: Adequate    SPECIAL CARE FACTORS FREQUENCY  PT (By licensed PT), OT (By licensed OT)     PT Frequency: x5/week OT Frequency: x5/week            Contractures Contractures Info: Not present    Additional Factors Info  Code Status, Allergies Code Status Info: Full Allergies Info: Spinach           Current Medications (06/17/2024):  This is the current hospital active medication list Current Facility-Administered Medications  Medication Dose Route Frequency Provider Last Rate Last Admin   albuterol  (VENTOLIN  HFA) 108 (90 Base) MCG/ACT inhaler 1-2 puff  1-2 puff Inhalation Q4H PRN Murphy, Laura A, PA-C       allopurinol  (  ZYLOPRIM ) tablet 100 mg  100 mg Oral Daily Beverley Doffing A, PA-C   100 mg at 06/17/24 1810   ascorbic acid  (VITAMIN C ) tablet 500 mg  500 mg Oral Daily Beverley Doffing LABOR, PA-C   500 mg at 06/17/24 0936   aspirin  EC tablet 81 mg  81 mg Oral Daily Murphy, Laura A, PA-C   81 mg at 06/17/24 0935   calcium  carbonate (OS-CAL - dosed in mg of elemental calcium ) tablet 1,250 mg  1 tablet Oral BID WC Trine Raynell Moder, MD   1,250  mg at 06/17/24 1810   cholecalciferol  (VITAMIN D3) 25 MCG (1000 UNIT) tablet 1,000 Units  1,000 Units Oral Daily Beverley Doffing LABOR, PA-C   1,000 Units at 06/17/24 9063   cyanocobalamin  (VITAMIN B12) tablet 500 mcg  500 mcg Oral Daily Beverley Doffing LABOR, PA-C   500 mcg at 06/17/24 9065   docusate sodium  (COLACE) capsule 100 mg  100 mg Oral BID Murphy, Laura A, PA-C   100 mg at 06/17/24 1810   folic acid  (FOLVITE ) tablet 0.5 mg  0.5 mg Oral Daily Beverley Doffing LABOR, PA-C   0.5 mg at 06/17/24 9065   HYDROcodone -acetaminophen  (NORCO) 10-325 MG per tablet 1 tablet  1 tablet Oral Q6H PRN Mannie Pac T, DO   1 tablet at 06/16/24 1250   hydrOXYzine  (ATARAX ) tablet 25 mg  25 mg Oral Q6H PRN Trine Raynell Moder, MD       loratadine  (CLARITIN ) tablet 10 mg  10 mg Oral Daily Beverley Doffing A, PA-C   10 mg at 06/17/24 9062   megestrol  (MEGACE ) 400 MG/10ML suspension 200 mg  200 mg Oral Daily Murphy, Laura A, PA-C       metoprolol  succinate (TOPROL -XL) 24 hr tablet 25 mg  25 mg Oral Daily Murphy, Laura A, PA-C   25 mg at 06/17/24 0936   multivitamin with minerals tablet 1 tablet  1 tablet Oral Daily Beverley Doffing LABOR, PA-C   1 tablet at 06/17/24 9066   ondansetron  (ZOFRAN -ODT) disintegrating tablet 8 mg  8 mg Oral Q8H PRN Murphy, Laura A, PA-C       pantoprazole  (PROTONIX ) EC tablet 40 mg  40 mg Oral Daily Beverley Doffing A, PA-C   40 mg at 06/17/24 0935   polyethylene glycol (MIRALAX  / GLYCOLAX ) packet 17 g  17 g Oral Daily PRN Murphy, Laura A, PA-C       rosuvastatin  (CRESTOR ) tablet 10 mg  10 mg Oral Once per day on Monday Wednesday Friday Beverley Doffing A, PA-C       tiZANidine  (ZANAFLEX ) tablet 4 mg  4 mg Oral BID PRN Murphy, Laura A, PA-C       Current Outpatient Medications  Medication Sig Dispense Refill   Abatacept  125 MG/ML SOSY Inject 125 mg into the skin once a week. Wednesday     allopurinol  (ZYLOPRIM ) 100 MG tablet TAKE 1 TABLET BY MOUTH EVERY DAY 90 tablet 0   aspirin  EC 81 MG tablet Take 1 tablet (81  mg total) by mouth daily.     CALCIUM  PO Take 1 tablet by mouth 2 (two) times daily. 600 mg of calcium  with vitamin D      cetirizine  (ZYRTEC ) 10 MG tablet Take 1 tablet (10 mg total) by mouth daily. 90 tablet 1   Cholecalciferol  (VITAMIN D ) 2000 units CAPS Take 1,000 Units by mouth daily.     docusate sodium  (COLACE) 100 MG capsule Take 100 mg by mouth 2 (two) times daily.  esomeprazole  (NEXIUM ) 40 MG capsule TAKE 1 CAPSULE BY MOUTH EVERY DAY AT NOON 90 capsule 0   folic acid  (FOLVITE ) 400 MCG tablet Take 400 mcg by mouth daily.     HYDROcodone -acetaminophen  (NORCO) 10-325 MG tablet Take 1 tablet by mouth every 6 (six) hours as needed for moderate pain (pain score 4-6). 120 tablet 0   megestrol  (MEGACE ) 400 MG/10ML suspension Take 5 mLs (200 mg total) by mouth daily. (Patient taking differently: Take 200 mg by mouth daily as needed (inability to eat).) 240 mL 5   Methotrexate  Sodium (METHOTREXATE , PF,) 50 MG/2ML injection Inject 0.5 mLs into the muscle once a week. Wednesday  0   metoprolol  succinate (TOPROL -XL) 25 MG 24 hr tablet Take 1 tablet (25 mg total) by mouth daily. 90 tablet 3   Multiple Vitamin (MULTIVITAMIN WITH MINERALS) TABS tablet Take 1 tablet by mouth daily.     polyethylene glycol (MIRALAX  / GLYCOLAX ) 17 g packet Take 17 g by mouth daily as needed for mild constipation. 14 each 1   predniSONE  (DELTASONE ) 5 MG tablet Take 5-10 mg by mouth daily as needed (rheumatoid arthritis - pain).     tiZANidine  (ZANAFLEX ) 4 MG tablet TAKE 1 TABLET(4 MG) BY MOUTH TWICE DAILY 180 tablet 5   traMADol  (ULTRAM ) 50 MG tablet Take 2 tablets (100 mg total) by mouth every 6 (six) hours as needed for moderate pain (pain score 4-6). (Patient taking differently: Take 100 mg by mouth 2 (two) times daily.) 180 tablet 5   vitamin B-12 (CYANOCOBALAMIN ) 500 MCG tablet Take 500 mcg by mouth daily.     vitamin C  (ASCORBIC ACID ) 500 MG tablet Take 500 mg by mouth daily.      albuterol  (VENTOLIN  HFA) 108 (90  Base) MCG/ACT inhaler INHALE 1 PUFF INTO THE LUNGS EVERY 4 HOURS AS NEEDED FOR WHEEZING OR SHORTNESS OF BREATH 8.5 g 3   betamethasone  dipropionate 0.05 % lotion Apply 1 Application topically as needed. (Patient not taking: Reported on 06/17/2024)     ciprofloxacin -dexamethasone  (CIPRODEX ) OTIC suspension Place 4 drops into both ears 2 (two) times daily. (Patient not taking: Reported on 06/17/2024) 7.5 mL 5   clobetasol  ointment (TEMOVATE ) 0.05 % Apply 1 Application topically 2 (two) times daily. (Patient not taking: Reported on 06/17/2024) 60 g 5   diclofenac sodium (VOLTAREN) 1 % GEL Apply 1 application topically 4 (four) times daily as needed (arthritis). (Patient not taking: Reported on 06/17/2024)     HYDROcodone  bit-homatropine (HYCODAN) 5-1.5 MG/5ML syrup Take 5 mLs by mouth every 4 (four) hours as needed. (Patient not taking: Reported on 06/17/2024) 240 mL 0   hydrocortisone  2.5 % cream Apply 1 Application topically 2 (two) times daily as needed. (Patient not taking: Reported on 06/17/2024)     hydrOXYzine  (VISTARIL ) 25 MG capsule Take 1 capsule (25 mg total) by mouth every 6 (six) hours as needed for itching. (Patient not taking: Reported on 06/17/2024) 60 capsule 2   ketoconazole  (NIZORAL ) 2 % shampoo Apply 1 application topically 2 (two) times a week.     mupirocin  ointment (BACTROBAN ) 2 % APPLY TOPICALLY TO THE AFFECTED AREA TWICE DAILY 30 g 2   ondansetron  (ZOFRAN -ODT) 8 MG disintegrating tablet Take 1 tablet (8 mg total) by mouth every 8 (eight) hours as needed for nausea or vomiting. (Patient not taking: Reported on 06/17/2024) 60 tablet 5   PERIDEX  0.12 % solution Use as directed 15 mLs in the mouth or throat 2 (two) times daily as needed (gingivitis). (Patient not  taking: Reported on 06/17/2024)     rosuvastatin  (CRESTOR ) 10 MG tablet Take 1 tablet (10 mg total) by mouth 3 (three) times a week. (Patient not taking: Reported on 06/17/2024) 90 tablet 3   triamcinolone  lotion (KENALOG ) 0.1  % Apply topically 2 (two) times daily. (Patient not taking: Reported on 06/17/2024) 60 mL 5     Discharge Medications: Please see discharge summary for a list of discharge medications.  Relevant Imaging Results:  Relevant Lab Results:   Additional Information SSN:[id number]  Hartley KATHEE Robertson, LCSWA     "

## 2024-06-17 NOTE — ED Notes (Signed)
 Not given this am due to contraindicated alert when med scanned

## 2024-06-17 NOTE — Consult Note (Signed)
 Reason for Consult: Right hip pain  [name] is an 68 y.o. female.  HPI: I was consulted due to the patient's right hip pain.  Per the patient, she does walk with a walker at baseline.  Patient states she has been having pain in the right hip for about the last week ordered, obtained,.  Her pain has been worsening.  She  did present to the emergency department, at which time an acetabular fracture was identified, and I was called to evaluate her.  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   bilateral     Past Surgical History:  Procedure Laterality Date   ABLATION  01/04/2014   atrial flutter ablation (2 circuits) by Dr Waddell   ATRIAL FLUTTER ABLATION N/A 01/04/2014   Procedure: ATRIAL FLUTTER ABLATION;  Surgeon: Danelle LELON Waddell, MD;  Location: [facility] CATH LAB;  Service: Cardiovascular;  Laterality: N/A;   BUNIONECTOMY Bilateral 06/2008   COLECTOMY WITH COLOSTOMY CREATION/HARTMANN PROCEDURE  11/27/2012   colectomy   COLOSTOMY   11/27/2012   HERNIA REPAIR     IR RADIOLOGIST EVAL & MGMT  10/01/2017   IR RADIOLOGIST EVAL & MGMT  11/21/2017   IR RADIOLOGIST EVAL & MGMT  02/13/2018   IR RADIOLOGIST EVAL & MGMT  01/08/2019   IR RADIOLOGIST EVAL & MGMT  12/29/2019   IR RADIOLOGIST EVAL & MGMT  01/31/2021   IR RADIOLOGIST EVAL & MGMT  12/20/2021   LAPAROSCOPIC ABDOMINAL EXPLORATION N/A 01/06/2013   Procedure: LAPAROSCOPIC ABDOMINAL EXPLORATION;  Surgeon: Elspeth KYM Schultze, MD;  Location: [facility] ORS;  Service: General;  Laterality: N/A;   LAPAROSCOPIC LYSIS OF ADHESIONS N/A 01/06/2013   Procedure: LAPAROSCOPIC LYSIS OF ADHESIONS/ INTEROTOMY REPAIR;  Surgeon: Elspeth KYM Schultze, MD;  Location: [facility];  Service: General;  Laterality: N/A;   LAPAROTOMY N/A 11/27/2012   Procedure: EXPLORATORY LAPAROTOMY SIGMOID COLECTOMY, COLOSTOMY;  Surgeon: Redell Faith, DO;  Location: [facility];  Service: General;  Laterality: N/A;   RADIOLOGY WITH ANESTHESIA Left 10/23/2017   Procedure: CT RENAL CRYO AND BIOPSY;  Surgeon: Luverne Aran, MD;  Location: [facility];  Service: Radiology;  Laterality: Left;   TOTAL KNEE ARTHROPLASTY Bilateral 11/2006    Family History  Problem Relation Age of Onset   Heart attack Neg Hx    Colon cancer Neg Hx    Osteoporosis Neg Hx  Social History:  reports that she quit smoking about 11 years ago. Her smoking use included cigarettes. She smoked an average of 1 pack per day. She has never used smokeless tobacco. She reports current drug use. Drug: Hydrocodone . She reports that she does not drink alcohol.  Allergies: Allergies[1]  Medications: I have reviewed the patient's current medications.  No results found for this or any previous visit (from the past 48 hours).  CT PELVIS WO CONTRAST Result Date: 06/16/2024 EXAM: CT PELVIS, WITHOUT IV CONTRAST 06/16/2024 01:34:14 AM TECHNIQUE: Axial images were acquired through the pelvis without IV contrast. Reformatted images were reviewed. Automated exposure control,  iterative reconstruction, and/or weight based adjustment of the mA/kV was utilized to reduce the radiation dose to as low as reasonably achievable. COMPARISON: CT dated 09/26/22. CLINICAL HISTORY: Hip trauma, fracture suspected, no prior imaging; right hip pain. FINDINGS: BONES: Unchanged appearance of the lucent lesion in the left iliac bone compared to CT dated 03/24. Remote right inferior pubic ramus fracture. Nondisplaced posterior/superior acetabular fracture extending into the hip joint. JOINTS: Osteoarthritis of both hips. Nondisplaced posterior acetabular fracture extending into the hip joint. No dislocation. SOFT TISSUES: The soft tissues are unremarkable. INTRAPELVIC CONTENTS: Limited images of the intrapelvic contents demonstrate no acute abnormality. IMPRESSION: 1. Nondisplaced posterior/superior acetabular fracture extending into the hip joint. Electronically signed by: Norman Gatlin MD 06/16/2024 01:40 AM EST RP Workstation: [id number]    Review of Systems Blood pressure (!) 165/89, pulse 74, temperature 97.9 F (36.6 C), temperature source Oral, resp. rate 16, last menstrual period 06/25/2006, SpO2 95%. Physical Exam Constitutional:      Appearance: Normal appearance.  HENT:     Head: Normocephalic.  Pulmonary:     Effort: Pulmonary effort is normal.  Musculoskeletal:     Cervical back: Normal range of motion and neck supple.     Comments: + pain with ROM of right hip  Skin:    General: Skin is warm.     Capillary Refill: Capillary refill takes less than 2 seconds.  Neurological:     General: No focal deficit present.     Mental Status: She is alert.     Assessment/Plan: Patient with acetabular fracture as noted. Discussed case with Dr. Celena. Current recommendation is 50% WB on right side with ambulation, and WBAT for transfers. Patient to be transferred to SNF once bed becomes available. Pleas have patient f/u with Dr. Celena in 2-3 weeks.   [name] L [name] 06/17/2024,  7:24 AM         [1]  Allergies Allergen Reactions   Spinach Itching, Rash and Other (See Comments)    Welts.

## 2024-06-18 DIAGNOSIS — S32401A Unspecified fracture of right acetabulum, initial encounter for closed fracture: Secondary | ICD-10-CM | POA: Diagnosis not present

## 2024-06-18 DIAGNOSIS — M2550 Pain in unspecified joint: Secondary | ICD-10-CM | POA: Insufficient documentation

## 2024-06-18 LAB — CBC
HCT: 34.6 % — ABNORMAL LOW (ref 36.0–46.0)
Hemoglobin: 10.9 g/dL — ABNORMAL LOW (ref 12.0–15.0)
MCH: 31.7 pg (ref 26.0–34.0)
MCHC: 31.5 g/dL (ref 30.0–36.0)
MCV: 100.6 fL — ABNORMAL HIGH (ref 80.0–100.0)
Platelets: 283 K/uL (ref 150–400)
RBC: 3.44 MIL/uL — ABNORMAL LOW (ref 3.87–5.11)
RDW: 14.9 % (ref 11.5–15.5)
WBC: 7.2 K/uL (ref 4.0–10.5)
nRBC: 0 % (ref 0.0–0.2)

## 2024-06-18 LAB — BASIC METABOLIC PANEL WITH GFR
Anion gap: 9 (ref 5–15)
BUN: 15 mg/dL (ref 8–23)
CO2: 28 mmol/L (ref 22–32)
Calcium: 9.5 mg/dL (ref 8.9–10.3)
Chloride: 102 mmol/L (ref 98–111)
Creatinine, Ser: 0.73 mg/dL (ref 0.44–1.00)
GFR, Estimated: >60 mL/min
Glucose, Bld: 176 mg/dL — ABNORMAL HIGH (ref 70–99)
Potassium: 4.2 mmol/L (ref 3.5–5.1)
Sodium: 139 mmol/L (ref 135–145)

## 2024-06-18 LAB — HEPATIC FUNCTION PANEL
ALT: 14 U/L (ref 0–44)
AST: 28 U/L (ref 15–41)
Albumin: 3.7 g/dL (ref 3.5–5.0)
Alkaline Phosphatase: 85 U/L (ref 38–126)
Bilirubin, Direct: 0.3 mg/dL — ABNORMAL HIGH (ref 0.0–0.2)
Indirect Bilirubin: 0.3 mg/dL (ref 0.3–0.9)
Total Bilirubin: 0.6 mg/dL (ref 0.0–1.2)
Total Protein: 6.6 g/dL (ref 6.5–8.1)

## 2024-06-18 LAB — MAGNESIUM: Magnesium: 1.8 mg/dL (ref 1.7–2.4)

## 2024-06-18 LAB — PHOSPHORUS: Phosphorus: 4.1 mg/dL (ref 2.5–4.6)

## 2024-06-18 MED ORDER — ALBUTEROL SULFATE (2.5 MG/3ML) 0.083% IN NEBU: 2.5000 mg | INHALATION_SOLUTION | RESPIRATORY_TRACT | Status: DC | PRN

## 2024-06-18 MED ORDER — ONDANSETRON HCL 4 MG/2ML IJ SOLN: 4.0000 mg | Freq: Four times a day (QID) | INTRAMUSCULAR | Status: DC | PRN

## 2024-06-18 MED ORDER — ENOXAPARIN SODIUM 40 MG/0.4ML IJ SOSY
40.0000 mg | PREFILLED_SYRINGE | INTRAMUSCULAR | Status: DC
  Administered 2024-06-18: 40 mg via SUBCUTANEOUS
  Filled 2024-06-18: qty 0.4

## 2024-06-18 MED ORDER — ONDANSETRON HCL 4 MG PO TABS: 4.0000 mg | ORAL_TABLET | Freq: Four times a day (QID) | ORAL | Status: DC | PRN

## 2024-06-18 NOTE — ED Notes (Addendum)
 CSW reviewed chart and read that patient prefers [facility]. CSW reached out to Tiffany with [facility] who stated that they will have some DC tomorrow and will follow up to check their bed availability. CSW called daughter, Hortencia and provided the alternative accepting bed offers . Daughter stated that she will call CSW back after doing her resource on the accepting facilities - [facility], [facility], and [facility].   Addendum 11:39 AM  Malicia called CSW back and accepted [facility] bed offer. Accepted in HUB.

## 2024-06-18 NOTE — ED Provider Notes (Signed)
 Patient is here pending rehab placement with concern for acetabular fracture, acute.  Admission labs ordered, notable for no emergent findings  I spoke to Dr Celinda hospitalist who admitted the patient for placement.   Cottie Donnice PARAS, MD 06/18/24 1318

## 2024-06-18 NOTE — H&P (Signed)
 " History and Physical    Patient: [name] FMW:[id number] DOB: January 25, 1956 DOA: 06/15/2024 DOS: the patient was seen and examined on 06/18/2024 PCP: [name] LABOR, MD  Patient coming from: Home  Chief Complaint:  Chief Complaint  Patient presents with   Leg Pain   HPI: [name] is a 68 y.o. female with medical history significant of alcohol abuse in remission, paroxysmal atrial flutter, chronic systolic heart failure, gout, RNCPI-80, diverticulosis, diverticulitis, dysrhythmia, fibroid, hard of hearing, hypertension, papillary renal cell carcinoma, Marfan syndrome, small bowel obstruction, history of stroke, history of EtOH abuse who had a mechanical fall at home resulting in a right hip fracture 3 days ago, seen by orthopedic surgery who stated that this is nonsurgical and recommended physical therapy.  The [facility] team is currently working on placement.  We are admitting the patient for pain control.  She stated that her pain is controlled now.  She denied fever, chills, rhinorrhea, sore throat, wheezing or hemoptysis.  No chest pain, palpitations, diaphoresis, PND, orthopnea or pitting edema of the lower extremities.  No abdominal pain, nausea, emesis, diarrhea, melena or hematochezia, but gets frequently constipated.  No flank pain, dysuria, frequency or hematuria.  No polyuria, polydipsia, polyphagia or blurred vision.   Lab work: CBC showed white count 7.2, hemoglobin 10.9 g/dL with an MCV of 899.3 fL and platelets 283.  Phosphorus 4.1 and magnesium  1.8 mg/dL.  BMP showed a glucose of 176 mg/dL, but was otherwise normal.  LFTs with a direct bilirubin of 0.3 mg/dL, but unremarkable otherwise.  Imaging: CT pelvis without contrast showing nondisplaced posterior/superior acetabular fracture extending into the right hip joint.   ED course: Initial vital signs were temperature 97.9 F, pulse 74, respirations 16, BP 165/89 mmHg and O2 sat 95% on room air.  While in the ER the patient was  evaluated by orthopedic surgery who determined that this is a nonsurgical fracture.  She is on Norco 10/325 mg every 6 hours as needed and Zanaflex  4 mg twice daily as needed.  Review of Systems: As mentioned in the history of present illness. All other systems reviewed and are negative. 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   bilateral    Past Surgical History:  Procedure Laterality Date   ABLATION  01/04/2014   atrial flutter ablation (2 circuits) by Dr Waddell  ATRIAL FLUTTER ABLATION N/A 01/04/2014   Procedure: ATRIAL FLUTTER ABLATION;  Surgeon: Danelle LELON Birmingham, MD;  Location: [facility] CATH LAB;  Service: Cardiovascular;  Laterality: N/A;   BUNIONECTOMY Bilateral 06/2008   COLECTOMY WITH COLOSTOMY CREATION/HARTMANN PROCEDURE  11/27/2012   colectomy   COLOSTOMY  11/27/2012   HERNIA REPAIR     IR RADIOLOGIST EVAL & MGMT  10/01/2017   IR RADIOLOGIST EVAL & MGMT  11/21/2017   IR RADIOLOGIST EVAL & MGMT  02/13/2018   IR RADIOLOGIST  EVAL & MGMT  01/08/2019   IR RADIOLOGIST EVAL & MGMT  12/29/2019   IR RADIOLOGIST EVAL & MGMT  01/31/2021   IR RADIOLOGIST EVAL & MGMT  12/20/2021   LAPAROSCOPIC ABDOMINAL EXPLORATION N/A 01/06/2013   Procedure: LAPAROSCOPIC ABDOMINAL EXPLORATION;  Surgeon: Elspeth KYM Schultze, MD;  Location: [facility];  Service: General;  Laterality: N/A;   LAPAROSCOPIC LYSIS OF ADHESIONS N/A 01/06/2013   Procedure: LAPAROSCOPIC LYSIS OF ADHESIONS/ INTEROTOMY REPAIR;  Surgeon: Elspeth KYM Schultze, MD;  Location: [facility];  Service: General;  Laterality: N/A;   LAPAROTOMY N/A 11/27/2012   Procedure: EXPLORATORY LAPAROTOMY SIGMOID COLECTOMY, COLOSTOMY;  Surgeon: Redell Faith, DO;  Location: [facility];  Service: General;  Laterality: N/A;   RADIOLOGY WITH ANESTHESIA Left 10/23/2017   Procedure: CT RENAL CRYO AND BIOPSY;  Surgeon: Luverne Aran, MD;  Location: [facility];  Service: Radiology;  Laterality: Left;   TOTAL KNEE ARTHROPLASTY Bilateral 11/2006   Social History:  reports that she quit smoking about 11 years ago. Her smoking use included cigarettes. She smoked an average of 1 pack per day. She has never used smokeless tobacco. She reports current drug use. Drug: Hydrocodone . She reports that she does not drink alcohol.  Allergies[1]  Family History  Problem Relation Age of Onset   Heart attack Neg Hx    Colon cancer Neg Hx    Osteoporosis Neg Hx     Prior to Admission medications  Medication Sig Start Date End Date Taking? Authorizing Provider  Abatacept  125 MG/ML SOSY Inject 125 mg into the skin once a week. Wednesday   Yes [provider]  allopurinol  (ZYLOPRIM ) 100 MG tablet TAKE 1 TABLET BY MOUTH EVERY DAY 04/20/24  Yes [name] LABOR, MD  aspirin  EC 81 MG tablet Take 1 tablet (81 mg total) by mouth daily. 05/11/14  Yes Gabriel Noa, MD  CALCIUM  PO Take 1 tablet by mouth 2 (two) times daily. 600 mg of calcium  with vitamin D    Yes [provider]  cetirizine  (ZYRTEC ) 10 MG tablet Take 1  tablet (10 mg total) by mouth daily. 11/17/14  Yes [name] LABOR, MD  Cholecalciferol  (VITAMIN D ) 2000 units CAPS Take 1,000 Units by mouth daily.   Yes [provider]  docusate sodium  (COLACE) 100 MG capsule Take 100 mg by mouth 2 (two) times daily.   Yes [provider]  esomeprazole  (NEXIUM ) 40 MG capsule TAKE 1 CAPSULE BY MOUTH EVERY DAY AT NOON 06/12/24  Yes [name] LABOR, MD  folic acid  (FOLVITE ) 400 MCG tablet Take 400 mcg by mouth daily.   Yes [provider]  HYDROcodone -acetaminophen  (NORCO) 10-325 MG tablet Take 1 tablet by mouth every 6 (six) hours as needed for moderate pain (pain score 4-6). 04/15/24  Yes [name] LABOR, MD  megestrol  (MEGACE ) 400 MG/10ML suspension Take 5 mLs (200 mg total) by mouth daily. Patient taking differently: Take 200 mg by mouth daily as needed (inability to eat). 03/19/23  Yes [name]  A, MD  Methotrexate  Sodium (METHOTREXATE , PF,) 50 MG/2ML injection Inject 0.5 mLs into the muscle once a week. Wednesday 05/14/16  Yes [provider]  metoprolol  succinate (TOPROL -XL) 25 MG 24 hr tablet Take 1 tablet (25 mg total) by mouth daily. 01/22/24  Yes [name] LABOR, MD  Multiple Vitamin (MULTIVITAMIN WITH MINERALS) TABS tablet Take 1 tablet by mouth daily.   Yes [provider]  polyethylene glycol (MIRALAX  / GLYCOLAX ) 17 g packet Take 17 g by mouth daily as needed for mild constipation. 05/05/21  Yes [name] LABOR, MD  predniSONE  (DELTASONE ) 5 MG tablet Take 5-10 mg by mouth daily as needed (rheumatoid arthritis - pain).   Yes [provider]  tiZANidine  (ZANAFLEX ) 4 MG tablet TAKE 1 TABLET(4 MG) BY MOUTH TWICE DAILY 01/22/24  Yes [name] LABOR, MD  traMADol  (ULTRAM ) 50 MG tablet Take 2 tablets (100 mg total) by mouth every 6 (six) hours as needed for moderate pain (pain score 4-6). Patient taking differently: Take 100 mg by mouth 2 (two) times daily. 01/22/24  Yes [name] LABOR, MD  vitamin B-12  (CYANOCOBALAMIN ) 500 MCG tablet Take 500 mcg by mouth daily.   Yes [provider]  vitamin C  (ASCORBIC ACID ) 500 MG tablet Take 500 mg by mouth daily.    Yes [provider]  albuterol  (VENTOLIN  HFA) 108 (90 Base) MCG/ACT inhaler INHALE 1 PUFF INTO THE LUNGS EVERY 4 HOURS AS NEEDED FOR WHEEZING OR SHORTNESS OF BREATH 12/06/23   [name] LABOR, MD  betamethasone  dipropionate 0.05 % lotion Apply 1 Application topically as needed. Patient not taking: Reported on 06/17/2024 03/11/23   [provider]  ciprofloxacin -dexamethasone  (CIPRODEX ) OTIC suspension Place 4 drops into both ears 2 (two) times daily. Patient not taking: Reported on 06/17/2024 10/17/21   [name] LABOR, MD  clobetasol  ointment (TEMOVATE ) 0.05 % Apply 1 Application topically 2 (two) times daily. Patient not taking: Reported on 06/17/2024 03/01/23   [name] LABOR, MD  diclofenac sodium (VOLTAREN) 1 % GEL Apply 1 application topically 4 (four) times daily as needed (arthritis). Patient not taking: Reported on 06/17/2024    [provider]  HYDROcodone  bit-homatropine (HYCODAN) 5-1.5 MG/5ML syrup Take 5 mLs by mouth every 4 (four) hours as needed. Patient not taking: Reported on 06/17/2024 07/23/23   [name] LABOR, MD  hydrocortisone  2.5 % cream Apply 1 Application topically 2 (two) times daily as needed. Patient not taking: Reported on 06/17/2024 03/07/23   [provider]  hydrOXYzine  (VISTARIL ) 25 MG capsule Take 1 capsule (25 mg total) by mouth every 6 (six) hours as needed for itching. Patient not taking: Reported on 06/17/2024 03/19/23   [name] LABOR, MD  ketoconazole  (NIZORAL ) 2 % shampoo Apply 1 application topically 2 (two) times a week. 03/04/20   [provider]  mupirocin  ointment (BACTROBAN ) 2 % APPLY TOPICALLY TO THE AFFECTED AREA TWICE DAILY 01/24/24   [name] LABOR, MD  ondansetron  (ZOFRAN -ODT) 8 MG disintegrating tablet Take 1 tablet (8 mg total) by mouth every 8 (eight)  hours as needed for nausea or vomiting. Patient not taking: Reported on 06/17/2024 01/19/22   [name] LABOR, MD  PERIDEX  0.12 % solution Use as directed 15 mLs in the mouth or throat 2 (two) times daily as needed (gingivitis). Patient not taking: Reported on 06/17/2024 03/23/20   [provider]  rosuvastatin  (CRESTOR ) 10 MG tablet Take 1 tablet (10 mg total) by mouth 3 (three) times a week. Patient not  taking: Reported on 06/17/2024 01/25/23   [name] LABOR, MD  triamcinolone  lotion (KENALOG ) 0.1 % Apply topically 2 (two) times daily. Patient not taking: Reported on 06/17/2024 11/09/22   [name] LABOR, MD    Physical Exam: Vitals:   06/16/24 1422 06/16/24 2229 06/17/24 0631 06/17/24 0804  BP: 128/84 (!) 147/87 (!) 165/89 (!) 156/71  Pulse: 78 (!) 40 74 (!) 45  Resp: 18 18 16 16   Temp: 99.1 F (37.3 C) 97.6 F (36.4 C) 97.9 F (36.6 C) 98.3 F (36.8 C)  TempSrc:  Oral Oral Oral  SpO2: 96% 96% 95%    Physical Exam Vitals reviewed.  Constitutional:      General: She is awake. She is not in acute distress.    Appearance: She is ill-appearing.  HENT:     Head: Normocephalic.     Nose: No rhinorrhea.     Mouth/Throat:     Mouth: Mucous membranes are moist.  Eyes:     General: No scleral icterus.    Comments: Patient is blind.  Neck:     Vascular: No JVD.  Cardiovascular:     Rate and Rhythm: Normal rate and regular rhythm.     Heart sounds: S1 normal and S2 normal.  Pulmonary:     Effort: Pulmonary effort is normal.     Breath sounds: No wheezing, rhonchi or rales.  Abdominal:     General: Bowel sounds are normal. There is no distension.     Palpations: Abdomen is soft.     Tenderness: There is no abdominal tenderness. There is no right CVA tenderness or left CVA tenderness.  Musculoskeletal:     Cervical back: Neck supple.     Right hip: Tenderness present. Decreased range of motion.     Right lower leg: No edema.     Left lower leg: No edema.  Neurological:      Mental Status: She is alert.  Psychiatric:        Behavior: Behavior is cooperative.     Data Reviewed:  Results are pending, will review when available. 02/18/2023 transthoracic echocardiogram report. IMPRESSIONS:   1. Left ventricular ejection fraction, by estimation, is 60 to 65%. The  left ventricle has normal function. The left ventricle has no regional  wall motion abnormalities. There is mild left ventricular hypertrophy.  Left ventricular diastolic parameters  are consistent with Grade I diastolic dysfunction (impaired relaxation).   2. Right ventricular systolic function is normal. The right ventricular  size is normal. There is mildly elevated pulmonary artery systolic  pressure. The estimated right ventricular systolic pressure is 38.5 mmHg.   3. The mitral valve is abnormal. Moderate mitral valve regurgitation.   4. The aortic valve is tricuspid. Aortic valve regurgitation is not  visualized.   5. The inferior vena cava is normal in size with greater than 50%  respiratory variability, suggesting right atrial pressure of 3 mmHg.   Comparison(s): Changes from prior study are noted. 03/27/2021: LVEF 50-55%,  moderate MR.    Assessment and Plan: Principal Problem:   Closed right acetabular fracture (HCC) Admit to telemetry/inpatient. Analgesics as needed. Antiemetics as needed. TOC team working on placement. Orthopedic surgery evaluation appreciated.  Active Problems:   Osteoporosis Continue calcium  and vitamin D  supplement.    Hyperlipidemia Continue rosuvastatin  10 mg p.o. daily.    Legally blind Supportive care.    Essential hypertension   Chronic combined systolic and diastolic heart failure (HCC) Continue metoprolol  succinate 25 mg p.o. daily.  GERD Continue home PPI or formulary equivalent.    Constipation, chronic Continue Colace and MiraLAX .    Iron deficiency anemia Monitor hematocrit and hemoglobin.    Advance Care Planning:    Code Status: Full Code   Consults: Orthopedic surgery.  Family Communication:   Severity of Illness: The appropriate patient status for this patient is INPATIENT. Inpatient status is judged to be reasonable and necessary in order to provide the required intensity of service to ensure the patient's safety. The patient's presenting symptoms, physical exam findings, and initial radiographic and laboratory data in the context of their chronic comorbidities is felt to place them at high risk for further clinical deterioration. Furthermore, it is not anticipated that the patient will be medically stable for discharge from the hospital within 2 midnights of admission.   * I certify that at the point of admission it is my clinical judgment that the patient will require inpatient hospital care spanning beyond 2 midnights from the point of admission due to high intensity of service, high risk for further deterioration and high frequency of surveillance required.*  Author: Alm Dorn Castor, MD 06/18/2024 1:34 PM  For on call review [email or web address].   This document was prepared using Dragon voice recognition software and may contain some unintended transcription errors.     [1]  Allergies Allergen Reactions   Rosuvastatin  Diarrhea, Itching and Other (See Comments)    Severe myalgia    Spinach Itching, Rash and Other (See Comments)    Welts.   "

## 2024-06-19 DIAGNOSIS — S32401D Unspecified fracture of right acetabulum, subsequent encounter for fracture with routine healing: Secondary | ICD-10-CM

## 2024-06-19 DIAGNOSIS — S32401A Unspecified fracture of right acetabulum, initial encounter for closed fracture: Secondary | ICD-10-CM | POA: Diagnosis not present

## 2024-06-19 LAB — CBC
HCT: 33.2 % — ABNORMAL LOW (ref 36.0–46.0)
Hemoglobin: 10.5 g/dL — ABNORMAL LOW (ref 12.0–15.0)
MCH: 31.3 pg (ref 26.0–34.0)
MCHC: 31.6 g/dL (ref 30.0–36.0)
MCV: 99.1 fL (ref 80.0–100.0)
Platelets: 292 K/uL (ref 150–400)
RBC: 3.35 MIL/uL — ABNORMAL LOW (ref 3.87–5.11)
RDW: 14.6 % (ref 11.5–15.5)
WBC: 6.2 K/uL (ref 4.0–10.5)
nRBC: 0 % (ref 0.0–0.2)

## 2024-06-19 LAB — HIV ANTIBODY (ROUTINE TESTING W REFLEX): HIV Screen 4th Generation wRfx: NONREACTIVE

## 2024-06-19 MED ORDER — TIZANIDINE HCL 4 MG PO TABS: 4.0000 mg | ORAL_TABLET | Freq: Two times a day (BID) | ORAL | 0 refills | Status: AC | PRN

## 2024-06-19 MED ORDER — HYDROCODONE-ACETAMINOPHEN 10-325 MG PO TABS: 1.0000 | ORAL_TABLET | Freq: Four times a day (QID) | ORAL | 0 refills | Status: DC | PRN

## 2024-06-19 MED ORDER — TRAMADOL HCL 50 MG PO TABS: 50.0000 mg | ORAL_TABLET | Freq: Two times a day (BID) | ORAL | 0 refills | Status: DC

## 2024-06-19 MED ORDER — ACETAMINOPHEN 500 MG PO TABS
1000.0000 mg | ORAL_TABLET | Freq: Three times a day (TID) | ORAL | Status: DC
  Administered 2024-06-19: 1000 mg via ORAL
  Filled 2024-06-19: qty 2

## 2024-06-19 NOTE — Discharge Summary (Signed)
 Physician Discharge Summary  [name] FMW:[id number] DOB: 1956/02/21 DOA: 06/15/2024  PCP: [name] LABOR, MD  Admit date: 06/15/2024 Discharge date: 06/19/2024  Admitted From: Home Disposition: SNF  Recommendations for Outpatient Follow-up:  Follow up with PCP in 1-2 weeks Please obtain BMP/CBC in one week your next doctors visit.  Outpatient follow-up with orthopedic in 2-3 weeks Weight bearing recommendations as mentioned below   Discharge Condition: Stable CODE STATUS: Full code Diet recommendation: Heart healthy  Brief/Interim Summary: Brief Narrative:  68 y.o. female with medical history significant of alcohol abuse in remission, paroxysmal atrial flutter, chronic systolic heart failure, gout, RNCPI-80, diverticulosis, diverticulitis, dysrhythmia, fibroid, hard of hearing, hypertension, papillary renal cell carcinoma, Marfan syndrome, small bowel obstruction, history of stroke, history of EtOH abuse who had a mechanical fall at home resulting in a right hip fracture 3 days ago, seen by orthopedic surgery who stated that this is nonsurgical and recommended physical therapy.  The [facility] team is currently working on placement.  We are admitting the patient for pain control.  She stated that her pain is controlled now.  CT pelvis without contrast showing nondisplaced posterior/superior acetabular fracture extending into the right hip joint. Orthopedic recommendation-50% weightbearing on the right side with ambulation.  Weightbearing as tolerated for transfers.  Outpatient follow-up in 2-3 weeks with orthopedic  Stable for dc to SNF   Assessment & Plan:  Closed right acetabular fracture (HCC) Nonsurgical orthopedic.  Pain control, bowel regimen Orthopedic recommendation-50% weightbearing on the right side with ambulation.  Weightbearing as tolerated for transfers.  Outpatient follow-up in 2-3 weeks with orthopedic PT OT-SNF placement     Osteoporosis Continue calcium  and  vitamin D  supplement.     Hyperlipidemia Continue rosuvastatin  10 mg p.o. daily.     Legally blind Supportive care.     Essential hypertension   Chronic combined systolic and diastolic heart failure (HCC) Continue metoprolol  succinate 25 mg p.o. daily.  Marfan syndrome Papillary renal cell carcinoma -Reports per history, limited information     GERD Continue home PPI or formulary equivalent.     Constipation, chronic Continue Colace and MiraLAX .     Iron deficiency anemia Monitor hematocrit and hemoglobin.   DVT prophylaxis: enoxaparin  (LOVENOX ) injection 40 mg Start: 06/18/24 2200      Code Status: Full Code Family Communication:   Status is: Inpatient Remains inpatient appropriate because: Ongoing SNF placement   PT Follow up Recs: Skilled Nursing-Short Term Rehab (<3 Hours/Day)06/17/2024 1458  Subjective: Seen at bedside no complaints.  Awaiting SNF placement   Examination:  General exam: Appears calm and comfortable  Respiratory system: Clear to auscultation. Respiratory effort normal. Cardiovascular system: S1 & S2 heard, RRR. No JVD, murmurs, rubs, gallops or clicks. No pedal edema. Gastrointestinal system: Abdomen is nondistended, soft and nontender. No organomegaly or masses felt. Normal bowel sounds heard. Central nervous system: Alert and oriented. No focal neurological deficits. Extremities: Symmetric 5 x 5 power. Skin: No rashes, lesions or ulcers Psychiatry: Judgement and insight appear normal. Mood & affect appropriate.    Discharge Diagnoses:  Principal Problem:   Closed right acetabular fracture [facility]) Active Problems:   Hyperlipidemia   Legally blind   Essential hypertension   GERD   Osteoporosis   Constipation, chronic   Iron deficiency anemia   Chronic combined systolic and diastolic heart failure (HCC)      Discharge Exam: Vitals:   06/19/24 0105 06/19/24 0551  BP: 126/66 (!) 128/93  Pulse: 82 69  Resp: 16 16  Temp: (!)  97.5 F (36.4 C) 97.6 F (36.4 C)  SpO2: 100% 100%   Vitals:   06/18/24 1603 06/18/24 2102 06/19/24 0105 06/19/24 0551  BP: (!) 141/89 109/73 126/66 (!) 128/93  Pulse: 77 85 82 69  Resp: 16 16 16 16   Temp: 98 F (36.7 C) 98.1 F (36.7 C) (!) 97.5 F (36.4 C) 97.6 F (36.4 C)  TempSrc: Oral Oral Axillary Axillary  SpO2: 100% 100% 100% 100%  Weight: 63.5 kg     Height: 5' 8 (1.727 m)         Discharge Instructions   Allergies as of 06/19/2024       Reactions   Rosuvastatin  Diarrhea, Itching, Other (See Comments)   Severe myalgia    Spinach Itching, Rash, Other (See Comments)   Welts.        Medication List     STOP taking these medications    HYDROcodone  bit-homatropine 5-1.5 MG/5ML syrup Commonly known as: HYCODAN   hydrOXYzine  25 MG capsule Commonly known as: VISTARIL    megestrol  400 MG/10ML suspension Commonly known as: MEGACE    ondansetron  8 MG disintegrating tablet Commonly known as: ZOFRAN -ODT   rosuvastatin  10 MG tablet Commonly known as: Crestor        TAKE these medications    Abatacept  125 MG/ML Sosy Inject 125 mg into the skin once a week. Wednesday   albuterol  108 (90 Base) MCG/ACT inhaler Commonly known as: VENTOLIN  HFA INHALE 1 PUFF INTO THE LUNGS EVERY 4 HOURS AS NEEDED FOR WHEEZING OR SHORTNESS OF BREATH   allopurinol  100 MG tablet Commonly known as: ZYLOPRIM  TAKE 1 TABLET BY MOUTH EVERY DAY   ascorbic acid  500 MG tablet Commonly known as: VITAMIN C  Take 500 mg by mouth daily.   aspirin  EC 81 MG tablet Take 1 tablet (81 mg total) by mouth daily.   betamethasone  dipropionate 0.05 % lotion Apply 1 Application topically as needed.   CALCIUM  PO Take 1 tablet by mouth 2 (two) times daily. 600 mg of calcium  with vitamin D    cetirizine  10 MG tablet Commonly known as: ZYRTEC  Take 1 tablet (10 mg total) by mouth daily.   ciprofloxacin -dexamethasone  OTIC suspension Commonly known as: Ciprodex  Place 4 drops into both ears  2 (two) times daily.   clobetasol  ointment 0.05 % Commonly known as: TEMOVATE  Apply 1 Application topically 2 (two) times daily.   cyanocobalamin  500 MCG tablet Commonly known as: VITAMIN B12 Take 500 mcg by mouth daily.   diclofenac sodium 1 % Gel Commonly known as: VOLTAREN Apply 1 application topically 4 (four) times daily as needed (arthritis).   docusate sodium  100 MG capsule Commonly known as: COLACE Take 100 mg by mouth 2 (two) times daily.   esomeprazole  40 MG capsule Commonly known as: NEXIUM  TAKE 1 CAPSULE BY MOUTH EVERY DAY AT NOON   folic acid  400 MCG tablet Commonly known as: FOLVITE  Take 400 mcg by mouth daily.   HYDROcodone -acetaminophen  10-325 MG tablet Commonly known as: NORCO Take 1 tablet by mouth every 6 (six) hours as needed for moderate pain (pain score 4-6) or severe pain (pain score 7-10). What changed: reasons to take this   hydrocortisone  2.5 % cream Apply 1 Application topically 2 (two) times daily as needed.   ketoconazole  2 % shampoo Commonly known as: NIZORAL  Apply 1 application topically 2 (two) times a week.   methotrexate  (PF) 50 MG/2ML injection Inject 0.5 mLs into the muscle once a week. Wednesday   metoprolol  succinate 25 MG 24 hr  tablet Commonly known as: TOPROL -XL Take 1 tablet (25 mg total) by mouth daily.   multivitamin with minerals Tabs tablet Take 1 tablet by mouth daily.   mupirocin  ointment 2 % Commonly known as: BACTROBAN  APPLY TOPICALLY TO THE AFFECTED AREA TWICE DAILY   Peridex  0.12 % solution Generic drug: chlorhexidine  Use as directed 15 mLs in the mouth or throat 2 (two) times daily as needed (gingivitis).   polyethylene glycol 17 g packet Commonly known as: MIRALAX  / GLYCOLAX  Take 17 g by mouth daily as needed for mild constipation.   predniSONE  5 MG tablet Commonly known as: DELTASONE  Take 5-10 mg by mouth daily as needed (rheumatoid arthritis - pain).   tiZANidine  4 MG tablet Commonly known as:  ZANAFLEX  Take 1 tablet (4 mg total) by mouth 2 (two) times daily as needed for muscle spasms. TAKE 1 TABLET(4 MG) BY MOUTH TWICE DAILY What changed:  how much to take how to take this when to take this reasons to take this   traMADol  50 MG tablet Commonly known as: ULTRAM  Take 1 tablet (50 mg total) by mouth 2 (two) times daily. What changed:  how much to take when to take this reasons to take this   triamcinolone  lotion 0.1 % Commonly known as: KENALOG  Apply topically 2 (two) times daily.   Vitamin D  50 MCG (2000 UT) Caps Take 1,000 Units by mouth daily.        Contact information for follow-up providers     [name] LABOR, MD Follow up in 1 week(s).   Specialty: Family Medicine Contact information: [address] [place] [address] [phone]              Contact information for after-discharge care     Destination     [place] [facility] [place], [place] .   Service: Skilled Nursing Contact information: [address] [place]  [address] [phone]                    Allergies[1]  You were cared for by a hospitalist during your hospital stay. If you have any questions about your discharge medications or the care you received while you were in the hospital after you are discharged, you can call the unit and asked to speak with the hospitalist on call if the hospitalist that took care of you is not available. Once you are discharged, your primary care physician will handle any further medical issues. Please note that no refills for any discharge medications will be authorized once you are discharged, as it is imperative that you return to your primary care physician (or establish a relationship with a primary care physician if you do not have one) for your aftercare needs so that they can reassess your need for medications and monitor your lab values.  You were cared for by a hospitalist during your hospital stay. If you  have any questions about your discharge medications or the care you received while you were in the hospital after you are discharged, you can call the unit and asked to speak with the hospitalist on call if the hospitalist that took care of you is not available. Once you are discharged, your primary care physician will handle any further medical issues. Please note that NO REFILLS for any discharge medications will be authorized once you are discharged, as it is imperative that you return to your primary care physician (or establish a relationship with a primary care physician if you do  not have one) for your aftercare needs so that they can reassess your need for medications and monitor your lab values.  Please request your Prim.MD to go over all Hospital Tests and Procedure/Radiological results at the follow up, please get all Hospital records sent to your Prim MD by signing hospital release before you go home.  Get CBC, CMP, 2 view Chest X ray checked  by Primary MD during your next visit or SNF MD in 5-7 days ( we routinely change or add medications that can affect your baseline labs and fluid status, therefore we recommend that you get the mentioned basic workup next visit with your PCP, your PCP may decide not to get them or add new tests based on their clinical decision)  On your next visit with your primary care physician please Get Medicines reviewed and adjusted.  If you experience worsening of your admission symptoms, develop shortness of breath, life threatening emergency, suicidal or homicidal thoughts you must seek medical attention immediately by calling 911 or calling your MD immediately  if symptoms less severe.  You Must read complete instructions/literature along with all the possible adverse reactions/side effects for all the Medicines you take and that have been prescribed to you. Take any new Medicines after you have completely understood and accpet all the possible adverse  reactions/side effects.   Do not drive, operate heavy machinery, perform activities at heights, swimming or participation in water activities or provide baby sitting services if your were admitted for syncope or siezures until you have seen by Primary MD or a Neurologist and advised to do so again.  Do not drive when taking Pain medications.   Procedures/Studies: CT PELVIS WO CONTRAST Result Date: 06/16/2024 EXAM: CT PELVIS, WITHOUT IV CONTRAST 06/16/2024 01:34:14 AM TECHNIQUE: Axial images were acquired through the pelvis without IV contrast. Reformatted images were reviewed. Automated exposure control, iterative reconstruction, and/or weight based adjustment of the mA/kV was utilized to reduce the radiation dose to as low as reasonably achievable. COMPARISON: CT dated 09/26/22. CLINICAL HISTORY: Hip trauma, fracture suspected, no prior imaging; right hip pain. FINDINGS: BONES: Unchanged appearance of the lucent lesion in the left iliac bone compared to CT dated 03/24. Remote right inferior pubic ramus fracture. Nondisplaced posterior/superior acetabular fracture extending into the hip joint. JOINTS: Osteoarthritis of both hips. Nondisplaced posterior acetabular fracture extending into the hip joint. No dislocation. SOFT TISSUES: The soft tissues are unremarkable. INTRAPELVIC CONTENTS: Limited images of the intrapelvic contents demonstrate no acute abnormality. IMPRESSION: 1. Nondisplaced posterior/superior acetabular fracture extending into the hip joint. Electronically signed by: Norman Gatlin MD 06/16/2024 01:40 AM EST RP Workstation: [id number]     The results of significant diagnostics from this hospitalization (including imaging, microbiology, ancillary and laboratory) are listed below for reference.     Microbiology: No results found for this or any previous visit (from the past 240 hours).   Labs: BNP (last 3 results) No results for input(s): BNP in the last 8760 hours. Basic  Metabolic Panel: Recent Labs  Lab 06/18/24 1217  NA 139  K 4.2  CL 102  CO2 28  GLUCOSE 176*  BUN 15  CREATININE 0.73  CALCIUM  9.5  MG 1.8  PHOS 4.1   Liver Function Tests: Recent Labs  Lab 06/18/24 1217  AST 28  ALT 14  ALKPHOS 85  BILITOT 0.6  PROT 6.6  ALBUMIN 3.7   No results for input(s): LIPASE, AMYLASE in the last 168 hours. No results for input(s): AMMONIA in the last  168 hours. CBC: Recent Labs  Lab 06/18/24 1217 06/19/24 0318  WBC 7.2 6.2  HGB 10.9* 10.5*  HCT 34.6* 33.2*  MCV 100.6* 99.1  PLT 283 292   Cardiac Enzymes: No results for input(s): CKTOTAL, CKMB, CKMBINDEX, TROPONINI in the last 168 hours. BNP: Invalid input(s): POCBNP CBG: No results for input(s): GLUCAP in the last 168 hours. D-Dimer No results for input(s): DDIMER in the last 72 hours. Hgb A1c No results for input(s): HGBA1C in the last 72 hours. Lipid Profile No results for input(s): CHOL, HDL, LDLCALC, TRIG, CHOLHDL, LDLDIRECT in the last 72 hours. Thyroid  function studies No results for input(s): TSH, T4TOTAL, T3FREE, THYROIDAB in the last 72 hours.  Invalid input(s): FREET3 Anemia work up No results for input(s): VITAMINB12, FOLATE, FERRITIN, TIBC, IRON, RETICCTPCT in the last 72 hours. Urinalysis    Component Value Date/Time   COLORURINE YELLOW 09/27/2022 0422   APPEARANCEUR CLEAR 09/27/2022 0422   LABSPEC >=1.030 09/27/2022 0422   PHURINE 5.0 09/27/2022 0422   GLUCOSEU NEGATIVE 09/27/2022 0422   HGBUR NEGATIVE 09/27/2022 0422   BILIRUBINUR neg 12/04/2022 1111   KETONESUR 20 (A) 09/27/2022 0422   PROTEINUR Positive (A) 12/04/2022 1111   PROTEINUR 30 (A) 09/27/2022 0422   UROBILINOGEN 0.2 12/04/2022 1111   UROBILINOGEN 0.2 11/22/2014 0929   NITRITE neg 12/04/2022 1111   NITRITE NEGATIVE 09/27/2022 0422   LEUKOCYTESUR Large (3+) (A) 12/04/2022 1111   LEUKOCYTESUR NEGATIVE 09/27/2022 0422   Sepsis  Labs Recent Labs  Lab 06/18/24 1217 06/19/24 0318  WBC 7.2 6.2   Microbiology No results found for this or any previous visit (from the past 240 hours).   Time coordinating discharge:  I have spent 35 minutes face to face with the patient and on the ward discussing the patients care, assessment, plan and disposition with other care givers. >50% of the time was devoted counseling the patient about the risks and benefits of treatment/Discharge disposition and coordinating care.   SIGNED:   Burgess JAYSON Dare, MD  [facility] 06/19/2024, 10:59 AM   If 7PM-7AM, please contact night-coverage      [1]  Allergies Allergen Reactions   Rosuvastatin  Diarrhea, Itching and Other (See Comments)    Severe myalgia    Spinach Itching, Rash and Other (See Comments)    Welts.

## 2024-06-19 NOTE — Progress Notes (Signed)
 Report called to nurse Jonette.

## 2024-06-19 NOTE — Care Management Obs Status (Signed)
 MEDICARE OBSERVATION STATUS NOTIFICATION   Patient Details  Name: [name] MRN: [id number] Date of Birth: [birth year]   Medicare Observation Status Notification Given:  Chaney NORMAN ASPEN, LCSW 06/19/2024, 11:04 AM

## 2024-06-19 NOTE — Hospital Course (Addendum)
 Brief Narrative:  68 y.o. female with medical history significant of alcohol abuse in remission, paroxysmal atrial flutter, chronic systolic heart failure, gout, RNCPI-80, diverticulosis, diverticulitis, dysrhythmia, fibroid, hard of hearing, hypertension, papillary renal cell carcinoma, Marfan syndrome, small bowel obstruction, history of stroke, history of EtOH abuse who had a mechanical fall at home resulting in a right hip fracture 3 days ago, seen by orthopedic surgery who stated that this is nonsurgical and recommended physical therapy.  The [facility] team is currently working on placement.  We are admitting the patient for pain control.  She stated that her pain is controlled now.  CT pelvis without contrast showing nondisplaced posterior/superior acetabular fracture extending into the right hip joint. Orthopedic recommendation-50% weightbearing on the right side with ambulation.  Weightbearing as tolerated for transfers.  Outpatient follow-up in 2-3 weeks with orthopedic  Stable for dc to SNF   Assessment & Plan:  Closed right acetabular fracture (HCC) Nonsurgical orthopedic.  Pain control, bowel regimen Orthopedic recommendation-50% weightbearing on the right side with ambulation.  Weightbearing as tolerated for transfers.  Outpatient follow-up in 2-3 weeks with orthopedic PT OT-SNF placement     Osteoporosis Continue calcium  and vitamin D  supplement.     Hyperlipidemia Continue rosuvastatin  10 mg p.o. daily.     Legally blind Supportive care.     Essential hypertension   Chronic combined systolic and diastolic heart failure (HCC) Continue metoprolol  succinate 25 mg p.o. daily.  Marfan syndrome Papillary renal cell carcinoma -Reports per history, limited information     GERD Continue home PPI or formulary equivalent.     Constipation, chronic Continue Colace and MiraLAX .     Iron deficiency anemia Monitor hematocrit and hemoglobin.   DVT prophylaxis: enoxaparin  (LOVENOX )  injection 40 mg Start: 06/18/24 2200      Code Status: Full Code Family Communication:   Status is: Inpatient Remains inpatient appropriate because: Ongoing SNF placement   PT Follow up Recs: Skilled Nursing-Short Term Rehab (<3 Hours/Day)06/17/2024 1458  Subjective: Seen at bedside no complaints.  Awaiting SNF placement   Examination:  General exam: Appears calm and comfortable  Respiratory system: Clear to auscultation. Respiratory effort normal. Cardiovascular system: S1 & S2 heard, RRR. No JVD, murmurs, rubs, gallops or clicks. No pedal edema. Gastrointestinal system: Abdomen is nondistended, soft and nontender. No organomegaly or masses felt. Normal bowel sounds heard. Central nervous system: Alert and oriented. No focal neurological deficits. Extremities: Symmetric 5 x 5 power. Skin: No rashes, lesions or ulcers Psychiatry: Judgement and insight appear normal. Mood & affect appropriate.

## 2024-06-19 NOTE — Care Management CC44 (Signed)
"         Condition Code 44 Documentation Completed  Patient Details  Name: [name] MRN: [id number] Date of Birth: 07/24/1955   Condition Code 44 given:  Yes Patient signature on Condition Code 44 notice:  Yes (pt is blind; verbally reviewed) Documentation of 2 MD's agreement:  Yes Code 44 added to claim:  Yes    Charlisha Market, LCSW 06/19/2024, 11:05 AM  "

## 2024-06-19 NOTE — TOC Transition Note (Signed)
 Transition of Care [facility]) - Discharge Note   Patient Details  Name: [name] MRN: [id number] Date of Birth: 1956/02/10  [facility] CM/SW Contact:  NORMAN ASPEN, LCSW Phone Number: 06/19/2024, 12:57 PM   Clinical Narrative:     Pt is medically cleared for dc today to [facility] and have secured insurance authorization.  Pt and daughter aware and agreeable.  PTAR called at 12:55pm.  RN to call report to [phone].  No further IP CM needs.  Final next level of care: Skilled Nursing Facility Barriers to Discharge: Barriers Resolved   Patient Goals and CMS Choice Patient states their goals for this hospitalization and ongoing recovery are:: return home          Discharge Placement PASRR number recieved: 06/17/24            Patient chooses bed at: [facility] Patient to be transferred to facility by: PTAR Name of family member notified: daughter    Discharge Plan and Services Additional resources added to the After Visit Summary for                  DME Arranged: N/A DME Agency: NA                  Social Drivers of Health (SDOH) Interventions SDOH Screenings   Food Insecurity: No Food Insecurity (06/18/2024)  Housing: Low Risk (06/18/2024)  Transportation Needs: No Transportation Needs (06/18/2024)  Utilities: Not At Risk (06/18/2024)  Depression (PHQ2-9): Low Risk (10/01/2023)  Financial Resource Strain: Low Risk (10/16/2021)  Physical Activity: Inactive (10/16/2021)  Social Connections: Moderately Isolated (06/18/2024)  Stress: Stress Concern Present (10/16/2021)  Tobacco Use: Medium Risk (06/16/2024)     Readmission Risk Interventions    06/19/2024   12:56 PM  Readmission Risk Prevention Plan  Post Dischage Appt Complete  Medication Screening Complete  Transportation Screening Complete

## 2024-06-22 ENCOUNTER — Telehealth: Payer: Self-pay

## 2024-06-22 NOTE — Telephone Encounter (Signed)
 Copied from CRM #[id number]. Topic: General - Other >> Jun 22, 2024 12:00 PM [name] wrote: Reason for CRM: Patient is currently admitted in [facility] where she was sent after hospital discharge for a broken pelvis. She wanted to let provider know.

## 2024-06-24 NOTE — Telephone Encounter (Signed)
 Noted

## 2024-06-29 ENCOUNTER — Encounter (HOSPITAL_COMMUNITY): Payer: Self-pay

## 2024-06-29 ENCOUNTER — Other Ambulatory Visit: Payer: Self-pay

## 2024-06-29 ENCOUNTER — Inpatient Hospital Stay (HOSPITAL_COMMUNITY)
Admission: EM | Admit: 2024-06-29 | Discharge: 2024-07-08 | DRG: 389 | Disposition: A | Discharge status: Skilled Nursing Facility | Source: Skilled Nursing Facility | Attending: Internal Medicine | Admitting: Internal Medicine

## 2024-06-29 ENCOUNTER — Emergency Department (HOSPITAL_COMMUNITY)

## 2024-06-29 DIAGNOSIS — Z87898 Personal history of other specified conditions: Secondary | ICD-10-CM

## 2024-06-29 DIAGNOSIS — Z85528 Personal history of other malignant neoplasm of kidney: Secondary | ICD-10-CM

## 2024-06-29 DIAGNOSIS — R54 Age-related physical debility: Secondary | ICD-10-CM | POA: Diagnosis present

## 2024-06-29 DIAGNOSIS — I4892 Unspecified atrial flutter: Secondary | ICD-10-CM | POA: Diagnosis present

## 2024-06-29 DIAGNOSIS — K565 Intestinal adhesions [bands], unspecified as to partial versus complete obstruction: Secondary | ICD-10-CM | POA: Diagnosis present

## 2024-06-29 DIAGNOSIS — I110 Hypertensive heart disease with heart failure: Secondary | ICD-10-CM | POA: Diagnosis present

## 2024-06-29 DIAGNOSIS — S32401D Unspecified fracture of right acetabulum, subsequent encounter for fracture with routine healing: Secondary | ICD-10-CM

## 2024-06-29 DIAGNOSIS — D638 Anemia in other chronic diseases classified elsewhere: Secondary | ICD-10-CM | POA: Diagnosis present

## 2024-06-29 DIAGNOSIS — Z860101 Personal history of adenomatous and serrated colon polyps: Secondary | ICD-10-CM

## 2024-06-29 DIAGNOSIS — Z96653 Presence of artificial knee joint, bilateral: Secondary | ICD-10-CM | POA: Diagnosis present

## 2024-06-29 DIAGNOSIS — Z888 Allergy status to other drugs, medicaments and biological substances status: Secondary | ICD-10-CM

## 2024-06-29 DIAGNOSIS — Z79899 Other long term (current) drug therapy: Secondary | ICD-10-CM

## 2024-06-29 DIAGNOSIS — K567 Ileus, unspecified: Secondary | ICD-10-CM | POA: Diagnosis not present

## 2024-06-29 DIAGNOSIS — I5042 Chronic combined systolic (congestive) and diastolic (congestive) heart failure: Secondary | ICD-10-CM | POA: Diagnosis present

## 2024-06-29 DIAGNOSIS — I5020 Unspecified systolic (congestive) heart failure: Secondary | ICD-10-CM | POA: Diagnosis present

## 2024-06-29 DIAGNOSIS — Z7982 Long term (current) use of aspirin: Secondary | ICD-10-CM

## 2024-06-29 DIAGNOSIS — Q8740 Marfan's syndrome, unspecified: Secondary | ICD-10-CM

## 2024-06-29 DIAGNOSIS — K3189 Other diseases of stomach and duodenum: Secondary | ICD-10-CM | POA: Diagnosis present

## 2024-06-29 DIAGNOSIS — Z8616 Personal history of COVID-19: Secondary | ICD-10-CM | POA: Diagnosis not present

## 2024-06-29 DIAGNOSIS — I4891 Unspecified atrial fibrillation: Secondary | ICD-10-CM | POA: Diagnosis present

## 2024-06-29 DIAGNOSIS — S32401A Unspecified fracture of right acetabulum, initial encounter for closed fracture: Secondary | ICD-10-CM | POA: Diagnosis present

## 2024-06-29 DIAGNOSIS — Z974 Presence of external hearing-aid: Secondary | ICD-10-CM

## 2024-06-29 DIAGNOSIS — Z8673 Personal history of transient ischemic attack (TIA), and cerebral infarction without residual deficits: Secondary | ICD-10-CM

## 2024-06-29 DIAGNOSIS — Z933 Colostomy status: Secondary | ICD-10-CM

## 2024-06-29 DIAGNOSIS — K56609 Unspecified intestinal obstruction, unspecified as to partial versus complete obstruction: Principal | ICD-10-CM | POA: Diagnosis present

## 2024-06-29 DIAGNOSIS — Z87891 Personal history of nicotine dependence: Secondary | ICD-10-CM | POA: Diagnosis not present

## 2024-06-29 DIAGNOSIS — M810 Age-related osteoporosis without current pathological fracture: Secondary | ICD-10-CM | POA: Diagnosis present

## 2024-06-29 DIAGNOSIS — K439 Ventral hernia without obstruction or gangrene: Secondary | ICD-10-CM | POA: Diagnosis present

## 2024-06-29 DIAGNOSIS — M1A9XX0 Chronic gout, unspecified, without tophus (tophi): Secondary | ICD-10-CM | POA: Diagnosis present

## 2024-06-29 DIAGNOSIS — H548 Legal blindness, as defined in USA: Secondary | ICD-10-CM | POA: Diagnosis present

## 2024-06-29 DIAGNOSIS — Z79631 Long term (current) use of antimetabolite agent: Secondary | ICD-10-CM

## 2024-06-29 DIAGNOSIS — M069 Rheumatoid arthritis, unspecified: Secondary | ICD-10-CM | POA: Diagnosis present

## 2024-06-29 DIAGNOSIS — D75839 Thrombocytosis, unspecified: Secondary | ICD-10-CM | POA: Diagnosis present

## 2024-06-29 DIAGNOSIS — Z91018 Allergy to other foods: Secondary | ICD-10-CM

## 2024-06-29 DIAGNOSIS — I639 Cerebral infarction, unspecified: Secondary | ICD-10-CM | POA: Diagnosis not present

## 2024-06-29 LAB — COMPREHENSIVE METABOLIC PANEL WITH GFR
ALT: 38 U/L (ref 0–44)
AST: 47 U/L — ABNORMAL HIGH (ref 15–41)
Albumin: 4.5 g/dL (ref 3.5–5.0)
Alkaline Phosphatase: 151 U/L — ABNORMAL HIGH (ref 38–126)
Anion gap: 17 — ABNORMAL HIGH (ref 5–15)
BUN: 32 mg/dL — ABNORMAL HIGH (ref 8–23)
CO2: 27 mmol/L (ref 22–32)
Calcium: 11.2 mg/dL — ABNORMAL HIGH (ref 8.9–10.3)
Chloride: 99 mmol/L (ref 98–111)
Creatinine, Ser: 0.93 mg/dL (ref 0.44–1.00)
GFR, Estimated: >60 mL/min
Glucose, Bld: 136 mg/dL — ABNORMAL HIGH (ref 70–99)
Potassium: 4.7 mmol/L (ref 3.5–5.1)
Sodium: 143 mmol/L (ref 135–145)
Total Bilirubin: 0.8 mg/dL (ref 0.0–1.2)
Total Protein: 8.3 g/dL — ABNORMAL HIGH (ref 6.5–8.1)

## 2024-06-29 LAB — I-STAT CHEM 8, ED
BUN: 33 mg/dL — ABNORMAL HIGH (ref 8–23)
Calcium, Ion: 1.28 mmol/L (ref 1.15–1.40)
Chloride: 102 mmol/L (ref 98–111)
Creatinine, Ser: 1 mg/dL (ref 0.44–1.00)
Glucose, Bld: 134 mg/dL — ABNORMAL HIGH (ref 70–99)
HCT: 45 % (ref 36.0–46.0)
Hemoglobin: 15.3 g/dL — ABNORMAL HIGH (ref 12.0–15.0)
Potassium: 4.5 mmol/L (ref 3.5–5.1)
Sodium: 141 mmol/L (ref 135–145)
TCO2: 27 mmol/L (ref 22–32)

## 2024-06-29 LAB — CBC WITH DIFFERENTIAL/PLATELET
Abs Immature Granulocytes: 0.02 K/uL (ref 0.00–0.07)
Basophils Absolute: 0 K/uL (ref 0.0–0.1)
Basophils Relative: 0 %
Eosinophils Absolute: 0 K/uL (ref 0.0–0.5)
Eosinophils Relative: 0 %
HCT: 44.5 % (ref 36.0–46.0)
Hemoglobin: 14.2 g/dL (ref 12.0–15.0)
Immature Granulocytes: 0 %
Lymphocytes Relative: 14 %
Lymphs Abs: 1.1 K/uL (ref 0.7–4.0)
MCH: 30.9 pg (ref 26.0–34.0)
MCHC: 31.9 g/dL (ref 30.0–36.0)
MCV: 96.9 fL (ref 80.0–100.0)
Monocytes Absolute: 0.3 K/uL (ref 0.1–1.0)
Monocytes Relative: 4 %
Neutro Abs: 6.4 K/uL (ref 1.7–7.7)
Neutrophils Relative %: 82 %
Platelets: 418 K/uL — ABNORMAL HIGH (ref 150–400)
RBC: 4.59 MIL/uL (ref 3.87–5.11)
RDW: 14 % (ref 11.5–15.5)
WBC: 7.8 K/uL (ref 4.0–10.5)
nRBC: 0.3 % — ABNORMAL HIGH (ref 0.0–0.2)

## 2024-06-29 LAB — LIPASE, BLOOD: Lipase: 25 U/L (ref 11–51)

## 2024-06-29 MED ORDER — SODIUM CHLORIDE 0.9 % IV BOLUS
1000.0000 mL | Freq: Once | INTRAVENOUS | Status: AC
  Administered 2024-06-29: 1000 mL via INTRAVENOUS

## 2024-06-29 MED ORDER — MORPHINE SULFATE (PF) 4 MG/ML IV SOLN
4.0000 mg | Freq: Once | INTRAVENOUS | Status: AC
  Administered 2024-06-29: 4 mg via INTRAVENOUS
  Filled 2024-06-29: qty 1

## 2024-06-29 MED ORDER — SODIUM CHLORIDE 0.9 % IV SOLN: INTRAVENOUS | Status: DC

## 2024-06-29 MED ORDER — ONDANSETRON HCL 4 MG/2ML IJ SOLN
4.0000 mg | Freq: Once | INTRAMUSCULAR | Status: AC
  Administered 2024-06-29: 4 mg via INTRAVENOUS
  Filled 2024-06-29: qty 2

## 2024-06-29 MED ORDER — PANTOPRAZOLE SODIUM 40 MG IV SOLR
40.0000 mg | INTRAVENOUS | Status: DC
  Administered 2024-06-30 – 2024-07-01 (×2): 40 mg via INTRAVENOUS
  Filled 2024-06-29 (×2): qty 10

## 2024-06-29 MED ORDER — ONDANSETRON HCL 4 MG PO TABS: 4.0000 mg | ORAL_TABLET | Freq: Four times a day (QID) | ORAL | Status: DC | PRN

## 2024-06-29 MED ORDER — ACETAMINOPHEN 325 MG PO TABS
650.0000 mg | ORAL_TABLET | Freq: Four times a day (QID) | ORAL | Status: DC | PRN
  Administered 2024-07-06: 650 mg via ORAL
  Filled 2024-06-29: qty 2

## 2024-06-29 MED ORDER — SODIUM CHLORIDE 0.9% FLUSH
3.0000 mL | Freq: Two times a day (BID) | INTRAVENOUS | Status: DC
  Administered 2024-06-30 – 2024-07-08 (×17): 3 mL via INTRAVENOUS

## 2024-06-29 MED ORDER — IOHEXOL 350 MG/ML SOLN
70.0000 mL | Freq: Once | INTRAVENOUS | Status: AC | PRN
  Administered 2024-06-29: 70 mL via INTRAVENOUS

## 2024-06-29 MED ORDER — HYDROMORPHONE HCL 1 MG/ML IJ SOLN
0.5000 mg | INTRAMUSCULAR | Status: DC | PRN
  Administered 2024-06-30 – 2024-07-01 (×4): 1 mg via INTRAVENOUS
  Filled 2024-06-29 (×4): qty 1

## 2024-06-29 MED ORDER — ONDANSETRON HCL 4 MG/2ML IJ SOLN: 4.0000 mg | Freq: Four times a day (QID) | INTRAMUSCULAR | Status: DC | PRN

## 2024-06-29 MED ORDER — METOPROLOL TARTRATE 5 MG/5ML IV SOLN
2.5000 mg | Freq: Three times a day (TID) | INTRAVENOUS | Status: DC
  Administered 2024-06-30 – 2024-07-01 (×5): 2.5 mg via INTRAVENOUS
  Filled 2024-06-29 (×5): qty 5

## 2024-06-29 MED ORDER — ACETAMINOPHEN 650 MG RE SUPP: 650.0000 mg | Freq: Four times a day (QID) | RECTAL | Status: DC | PRN

## 2024-06-29 NOTE — ED Triage Notes (Signed)
 Pt BIB [facility] from [facility] d/t Abnormal Xray.  Pt has possible SBO.  Pt has had N/V since this morning.  Pt has had Pelvic fracture from fall.  Pt is Blind.   BP 110/82 HR 110 O2 93% RR 16 Cbg 153

## 2024-06-29 NOTE — ED Notes (Signed)
 Patient transported to CT

## 2024-06-29 NOTE — ED Provider Notes (Signed)
 " [facility] Provider Note   CSN: [id number] Arrival date & time: 06/29/24  8046     Patient presents with: abnormal labs   BATOUL LIMES is a 69 y.o. female history of recurrent small bowel obstruction, recent pelvic fracture here presenting with abdominal pain and vomiting.  Patient states that she started vomiting and have worsening abdominal pain and distention this morning.  She states that she has history of recurrent small bowel obstruction.  She is living at [facility] and had an x-ray that showed possible small bowel obstruction and sent here for further evaluation.  Patient states that she has multiple abdominal surgeries previously.   The history is provided by the patient.       Prior to Admission medications  Medication Sig Start Date End Date Taking? Authorizing Provider  Abatacept  125 MG/ML SOSY Inject 125 mg into the skin once a week. Wednesday    [provider]  albuterol  (VENTOLIN  HFA) 108 (90 Base) MCG/ACT inhaler INHALE 1 PUFF INTO THE LUNGS EVERY 4 HOURS AS NEEDED FOR WHEEZING OR SHORTNESS OF BREATH 12/06/23   [name] LABOR, MD  allopurinol  (ZYLOPRIM ) 100 MG tablet TAKE 1 TABLET BY MOUTH EVERY DAY 04/20/24   [name] LABOR, MD  aspirin  EC 81 MG tablet Take 1 tablet (81 mg total) by mouth daily. 05/11/14   Gabriel Noa, MD  betamethasone  dipropionate 0.05 % lotion Apply 1 Application topically as needed. Patient not taking: Reported on 06/17/2024 03/11/23   [provider]  CALCIUM  PO Take 1 tablet by mouth 2 (two) times daily. 600 mg of calcium  with vitamin D     [provider]  cetirizine  (ZYRTEC ) 10 MG tablet Take 1 tablet (10 mg total) by mouth daily. 11/17/14   [name] LABOR, MD  Cholecalciferol  (VITAMIN D ) 2000 units CAPS Take 1,000 Units by mouth daily.    [provider]  ciprofloxacin -dexamethasone  (CIPRODEX ) OTIC suspension Place 4 drops into both ears 2 (two) times  daily. Patient not taking: Reported on 06/17/2024 10/17/21   [name] LABOR, MD  clobetasol  ointment (TEMOVATE ) 0.05 % Apply 1 Application topically 2 (two) times daily. Patient not taking: Reported on 06/17/2024 03/01/23   [name] LABOR, MD  diclofenac sodium (VOLTAREN) 1 % GEL Apply 1 application topically 4 (four) times daily as needed (arthritis). Patient not taking: Reported on 06/17/2024    [provider]  docusate sodium  (COLACE) 100 MG capsule Take 100 mg by mouth 2 (two) times daily.    [provider]  esomeprazole  (NEXIUM ) 40 MG capsule TAKE 1 CAPSULE BY MOUTH EVERY DAY AT NOON 06/12/24   [name] LABOR, MD  folic acid  (FOLVITE ) 400 MCG tablet Take 400 mcg by mouth daily.    [provider]  HYDROcodone -acetaminophen  (NORCO) 10-325 MG tablet Take 1 tablet by mouth every 6 (six) hours as needed for moderate pain (pain score 4-6) or severe pain (pain score 7-10). 06/19/24   Amin, Ankit C, MD  hydrocortisone  2.5 % cream Apply 1 Application topically 2 (two) times daily as needed. Patient not taking: Reported on 06/17/2024 03/07/23   [provider]  ketoconazole  (NIZORAL ) 2 % shampoo Apply 1 application topically 2 (two) times a week. 03/04/20   [provider]  Methotrexate  Sodium (METHOTREXATE , PF,) 50 MG/2ML injection Inject 0.5 mLs into the muscle once a week. Wednesday 05/14/16   [provider]  metoprolol  succinate (TOPROL -XL) 25 MG 24 hr tablet Take 1 tablet (  25 mg total) by mouth daily. 01/22/24   [name] LABOR, MD  Multiple Vitamin (MULTIVITAMIN WITH MINERALS) TABS tablet Take 1 tablet by mouth daily.    [provider]  mupirocin  ointment (BACTROBAN ) 2 % APPLY TOPICALLY TO THE AFFECTED AREA TWICE DAILY 01/24/24   [name] LABOR, MD  PERIDEX  0.12 % solution Use as directed 15 mLs in the mouth or throat 2 (two) times daily as needed (gingivitis). Patient not taking: Reported on 06/17/2024 03/23/20   [provider]   polyethylene glycol (MIRALAX  / GLYCOLAX ) 17 g packet Take 17 g by mouth daily as needed for mild constipation. 05/05/21   [name] LABOR, MD  predniSONE  (DELTASONE ) 5 MG tablet Take 5-10 mg by mouth daily as needed (rheumatoid arthritis - pain).    [provider]  tiZANidine  (ZANAFLEX ) 4 MG tablet Take 1 tablet (4 mg total) by mouth 2 (two) times daily as needed for muscle spasms. TAKE 1 TABLET(4 MG) BY MOUTH TWICE DAILY 06/19/24   Amin, Ankit C, MD  traMADol  (ULTRAM ) 50 MG tablet Take 1 tablet (50 mg total) by mouth 2 (two) times daily. 06/19/24   Amin, Ankit C, MD  triamcinolone  lotion (KENALOG ) 0.1 % Apply topically 2 (two) times daily. Patient not taking: Reported on 06/17/2024 11/09/22   [name] LABOR, MD  vitamin B-12 (CYANOCOBALAMIN ) 500 MCG tablet Take 500 mcg by mouth daily.    [provider]  vitamin C  (ASCORBIC ACID ) 500 MG tablet Take 500 mg by mouth daily.     [provider]    Allergies: Rosuvastatin  and Spinach    Review of Systems  Gastrointestinal:  Positive for abdominal distention and vomiting.  All other systems reviewed and are negative.   Updated Vital Signs BP 133/83   Pulse 62   Temp 97.8 F (36.6 C) (Oral)   Resp (!) 27   Ht 5' 8 (1.727 m)   Wt 63.5 kg   LMP 06/25/2006   SpO2 98%   BMI 21.29 kg/m   Physical Exam Vitals and nursing note reviewed.  Constitutional:      Comments: Uncomfortable and vomiting  HENT:     Head: Normocephalic.     Nose: Nose normal.     Mouth/Throat:     Mouth: Mucous membranes are dry.  Eyes:     Extraocular Movements: Extraocular movements intact.     Pupils: Pupils are equal, round, and reactive to light.  Cardiovascular:     Rate and Rhythm: Normal rate and regular rhythm.     Pulses: Normal pulses.     Heart sounds: Normal heart sounds.  Pulmonary:     Effort: Pulmonary effort is normal.     Breath sounds: Normal breath sounds.  Abdominal:     Comments: Distended and mild diffuse  tenderness.  Patient appears to have an incisional hernia that is firm but reducible  Musculoskeletal:        General: Normal range of motion.     Cervical back: Normal range of motion and neck supple.  Skin:    General: Skin is warm.     Capillary Refill: Capillary refill takes less than 2 seconds.  Neurological:     General: No focal deficit present.     Mental Status: She is oriented to person, place, and time.  Psychiatric:        Mood and Affect: Mood normal.     (all labs ordered are listed, but only abnormal results are displayed) Labs  Reviewed  CBC WITH DIFFERENTIAL/PLATELET - Abnormal; Notable for the following components:      Result Value   Platelets 418 (*)    nRBC 0.3 (*)    All other components within normal limits  I-STAT CHEM 8, ED - Abnormal; Notable for the following components:   BUN 33 (*)    Glucose, Bld 134 (*)    Hemoglobin 15.3 (*)    All other components within normal limits  COMPREHENSIVE METABOLIC PANEL WITH GFR  LIPASE, BLOOD    EKG: EKG Interpretation Date/Time:  Monday June 29 2024 20:09:10 EST Ventricular Rate:  109 PR Interval:    QRS Duration:  95 QT Interval:  333 QTC Calculation: 449 R Axis:   3  Text Interpretation: Atrial fibrillation Paired ventricular premature complexes LVH with secondary repolarization abnormality  poor baseline, grossly unchanged Confirmed by Patt Alm DEL [phone]) on 06/29/2024 8:59:10 PM  Radiology: No results found.   Procedures   Medications Ordered in the ED  morphine  (PF) 4 MG/ML injection 4 mg (has no administration in time range)  ondansetron  (ZOFRAN ) injection 4 mg (4 mg Intravenous Given 06/29/24 2012)  sodium chloride  0.9 % bolus 1,000 mL (1,000 mLs Intravenous New Bag/Given 06/29/24 2012)                                    Medical Decision Making CYRENA KUCHENBECKER is a 69 y.o. female here with vomiting and abdominal distention.  Patient x-ray showed possible small bowel obstruction.  Patient has  history of recurrent small bowel obstruction and had diverticulitis with perforation and had a colostomy.  Concern for recurrent obstruction.  Plan to get CBC and CMP and CT abdomen pelvis.  10:50 PM Reviewed patient's labs and patient had anion gap at 17.  CT showed large ventral hernia with a possible closed-loop obstruction.  Consulted Dr. Michelina from surgery to see patient.  11:01 PM Dr. Michelina saw patient and did not think patient actually has a closed-loop obstruction and states that this is just a small bowel obstruction and does not need to go to the OR.  Recommend NG tube and medicine admission.  Problems Addressed: SBO (small bowel obstruction) (HCC): acute illness or injury  Amount and/or Complexity of Data Reviewed Labs: ordered. Decision-making details documented in ED Course. Radiology: ordered and independent interpretation performed. Decision-making details documented in ED Course.  Risk Prescription drug management.     Final diagnoses:  None    ED Discharge Orders     None          Patt Alm Macho, MD 06/29/24 2304  "

## 2024-06-29 NOTE — Consult Note (Signed)
 "  Reason for Consult:vomiting Referring Provider: [name] is an 69 y.o. female.  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 She had an early bowel obstruction 5 weeks later and underwent lap lysis of adhesions. She has had multiple bowel obstructions over the years and often tries to stay at home and wait for things to improve. She vomited one time today and had some abdominal pain. She has not emptied her bag in 3 days.  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   bilateral     Past Surgical History:  Procedure Laterality Date   ABLATION  01/04/2014   atrial flutter ablation (2 circuits) by Dr Waddell   ATRIAL FLUTTER ABLATION N/A 01/04/2014   Procedure: ATRIAL FLUTTER ABLATION;  Surgeon: Danelle LELON Waddell, MD;  Location: [facility] CATH LAB;  Service: Cardiovascular;  Laterality: N/A;   BUNIONECTOMY Bilateral 06/2008   COLECTOMY WITH COLOSTOMY  CREATION/HARTMANN PROCEDURE  11/27/2012   colectomy   COLOSTOMY  11/27/2012   HERNIA REPAIR     IR RADIOLOGIST EVAL & MGMT  10/01/2017   IR RADIOLOGIST EVAL & MGMT  11/21/2017   IR RADIOLOGIST EVAL & MGMT  02/13/2018   IR RADIOLOGIST EVAL & MGMT  01/08/2019   IR RADIOLOGIST EVAL & MGMT  12/29/2019   IR RADIOLOGIST EVAL & MGMT  01/31/2021   IR RADIOLOGIST EVAL & MGMT  12/20/2021   LAPAROSCOPIC ABDOMINAL EXPLORATION N/A 01/06/2013   Procedure: LAPAROSCOPIC ABDOMINAL EXPLORATION;  Surgeon: Elspeth KYM Schultze, MD;  Location: [facility] ORS;  Service: General;  Laterality: N/A;   LAPAROSCOPIC LYSIS OF ADHESIONS N/A 01/06/2013   Procedure: LAPAROSCOPIC LYSIS OF ADHESIONS/ INTEROTOMY REPAIR;  Surgeon: Elspeth KYM Schultze, MD;  Location: [facility] ORS;  Service: General;  Laterality: N/A;   LAPAROTOMY N/A 11/27/2012   Procedure: EXPLORATORY LAPAROTOMY SIGMOID COLECTOMY, COLOSTOMY;  Surgeon: Redell Faith, DO;  Location: [facility] ORS;  Service: General;  Laterality: N/A;   RADIOLOGY WITH ANESTHESIA Left 10/23/2017   Procedure: CT RENAL CRYO AND BIOPSY;  Surgeon: Luverne Aran, MD;  Location: [facility] ORS;  Service: Radiology;  Laterality: Left;   TOTAL KNEE ARTHROPLASTY Bilateral 11/2006    Family History  Problem Relation Age of Onset   Heart attack Neg Hx    Colon cancer Neg Hx  Osteoporosis Neg Hx     Social History:  reports that she quit smoking about 11 years ago. Her smoking use included cigarettes. She smoked an average of 1 pack per day. She has never used smokeless tobacco. She reports current drug use. Drug: Hydrocodone . She reports that she does not drink alcohol.  Allergies: Allergies[1]  Medications: I have reviewed the patient's current medications.  Results for orders placed or performed during the hospital encounter of 06/29/24 (from the past 48 hours)  CBC with Differential     Status: Abnormal   Collection Time: 06/29/24  8:10 PM  Result Value Ref Range   WBC 7.8 4.0 - 10.5 K/uL   RBC 4.59 3.87 -  5.11 MIL/uL   Hemoglobin 14.2 12.0 - 15.0 g/dL   HCT 55.4 63.9 - 53.9 %   MCV 96.9 80.0 - 100.0 fL   MCH 30.9 26.0 - 34.0 pg   MCHC 31.9 30.0 - 36.0 g/dL   RDW 85.9 88.4 - 84.4 %   Platelets 418 (H) 150 - 400 K/uL   nRBC 0.3 (H) 0.0 - 0.2 %   Neutrophils Relative % 82 %   Neutro Abs 6.4 1.7 - 7.7 K/uL   Lymphocytes Relative 14 %   Lymphs Abs 1.1 0.7 - 4.0 K/uL   Monocytes Relative 4 %   Monocytes Absolute 0.3 0.1 - 1.0 K/uL   Eosinophils Relative 0 %   Eosinophils Absolute 0.0 0.0 - 0.5 K/uL   Basophils Relative 0 %   Basophils Absolute 0.0 0.0 - 0.1 K/uL   Immature Granulocytes 0 %   Abs Immature Granulocytes 0.02 0.00 - 0.07 K/uL    Comment: Performed at [facility], [address] [place], [place] [address]  Comprehensive metabolic panel     Status: Abnormal   Collection Time: 06/29/24  8:10 PM  Result Value Ref Range   Sodium 143 135 - 145 mmol/L   Potassium 4.7 3.5 - 5.1 mmol/L   Chloride 99 98 - 111 mmol/L   CO2 27 22 - 32 mmol/L   Glucose, Bld 136 (H) 70 - 99 mg/dL    Comment: Glucose reference range applies only to samples taken after fasting for at least 8 hours.   BUN 32 (H) 8 - 23 mg/dL   Creatinine, Ser 9.06 0.44 - 1.00 mg/dL   Calcium  11.2 (H) 8.9 - 10.3 mg/dL   Total Protein 8.3 (H) 6.5 - 8.1 g/dL   Albumin 4.5 3.5 - 5.0 g/dL   AST 47 (H) 15 - 41 U/L   ALT 38 0 - 44 U/L   Alkaline Phosphatase 151 (H) 38 - 126 U/L   Total Bilirubin 0.8 0.0 - 1.2 mg/dL   GFR, Estimated >39 >39 mL/min    Comment: (NOTE) Calculated using the CKD-EPI Creatinine Equation (2021)    Anion gap 17 (H) 5 - 15    Comment: Performed at [facility], [address], [place], [place] [address]  Lipase, blood     Status: None   Collection Time: 06/29/24  8:10 PM  Result Value Ref Range   Lipase 25 11 - 51 U/L    Comment: Performed at [facility], [address] [place], [place] [address]  I-stat chem 8, ED (not at [facility], [facility] or [facility])     Status: Abnormal    Collection Time: 06/29/24  8:25 PM  Result Value Ref Range   Sodium 141 135 - 145 mmol/L   Potassium 4.5  3.5 - 5.1 mmol/L   Chloride 102 98 - 111 mmol/L   BUN 33 (H) 8 - 23 mg/dL   Creatinine, Ser 8.99 0.44 - 1.00 mg/dL   Glucose, Bld 865 (H) 70 - 99 mg/dL    Comment: Glucose reference range applies only to samples taken after fasting for at least 8 hours.   Calcium , Ion 1.28 1.15 - 1.40 mmol/L   TCO2 27 22 - 32 mmol/L   Hemoglobin 15.3 (H) 12.0 - 15.0 g/dL   HCT 54.9 63.9 - 53.9 %    PE Blood pressure (!) 142/115, pulse (!) 46, temperature 97.8 F (36.6 C), temperature source Oral, resp. rate 17, height 5' 8 (1.727 m), weight 63.5 kg, last menstrual period 06/25/2006, SpO2 95%. Constitutional: NAD; conversant; no deformities Eyes: Moist conjunctiva; no lid lag; anicteric; PERRL Neck: Trachea midline; no thyromegaly Lungs: Normal respiratory effort; no tactile fremitus CV: RRR; no palpable thrills; no pitting edema GI: Abd soft, large ventral hernia, left sided ostomy with moderate amount stool in bag, nontender, no guarding, hernia soft; no palpable hepatosplenomegaly MSK: unable to assess gait; no clubbing/cyanosis Psychiatric: Appropriate affect; alert and oriented x3 Lymphatic: No palpable cervical or axillary lymphadenopathy Skin: No major subcutaneous nodules. Warm and dry   Assessment/Plan: 69 yo female with bowel obstruction. I reviewed Ct scan images show a loop of intestine moving around the left colon as it goes to the ostomy site. This was seen similarly 1 year ago on imaging as well. I do not think this represents a closed loop obstruction. I think she is a candidate for nonoperative management. I reviewed [name] and Gross's operative notes. Gross notes complete lysis of adhesions as he is known to do with one serosal injury. Given her gastric distension on CT scan I think she would benefit from NG tube placement and medical admission.   I reviewed last 24 h vitals and  pain scores, last 48 h intake and output, last 24 h labs and trends, and last 24 h imaging results.  This care required high  level of medical decision making.   Herlene Righter Kjersti Dittmer 06/29/2024, 11:04 PM     [1]  Allergies Allergen Reactions   Rosuvastatin  Diarrhea, Itching and Other (See Comments)    Severe myalgia    Spinach Itching, Rash and Other (See Comments)    Welts.   "

## 2024-06-29 NOTE — H&P (Signed)
 " History and Physical    [name] FMW:[id number] DOB: 22-Mar-1956 DOA: 06/29/2024  PCP: [name] LABOR, MD   Patient coming from: SNF   Chief Complaint: Abdominal pain, N/V   HPI: [name] is a 69 y.o. female with medical history significant for atrial flutter status post ablation, hypertension, history of CVA, CHF with improved EF, rheumatoid arthritis, blindness, perforated diverticulitis in 2014 status post sigmoid colectomy, multiple bowel obstructions, and right acetabular fracture last month managed conservatively who now presents with abdominal pain, nausea, and vomiting.  Patient reports that she developed generalized abdominal pain at roughly 2 AM yesterday.  She went on to develop nausea and vomiting that persisted throughout the day, eventually prompting evaluation with radiographs at her facility which were concerning for bowel obstruction.    Patient denies chest pain, shortness of breath, fever, or chills.  ED Course: Upon arrival to the ED, patient is found to be afebrile and saturating well on room air with labile HR and stable BP.  Labs are notable for elevated BUN to creatinine ratio, normal WBC, and mild thrombocytosis.  CT reveals dilated small bowel with transition segment in the central abdomen likely due to adhesive disease, as well as large ventral hernia, possibly containing a closed-loop obstruction.  Patient was evaluated by surgery in the emergency department and treated with 1 L NS, Zofran  x 2, and morphine .  Review of Systems:  All other systems reviewed and apart from HPI, are negative.  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 Jon Jacob    SBO (small bowel obstruction) (HCC) 01/05/2013   Seborrhea capitis    sees Corin Pesci FNP at [facility]   Small bowel obstruction due to adhesions [facility])    Status post bunionectomy 06/2008   bilateral    Stroke (HCC) 07/2006   Substance abuse (HCC)    recovering alcoholic x12 years    Total knee replacement status 11/2006   bilateral     Past Surgical History:  Procedure Laterality Date   ABLATION  01/04/2014   atrial flutter ablation (2 circuits) by Dr Waddell   ATRIAL FLUTTER ABLATION N/A 01/04/2014   Procedure: ATRIAL FLUTTER ABLATION;  Surgeon: Danelle LELON Waddell, MD;  Location: [facility] CATH LAB;  Service: Cardiovascular;  Laterality: N/A;   BUNIONECTOMY Bilateral 06/2008   COLECTOMY WITH COLOSTOMY CREATION/HARTMANN PROCEDURE  11/27/2012   colectomy   COLOSTOMY  11/27/2012   HERNIA REPAIR     IR RADIOLOGIST EVAL & MGMT  10/01/2017   IR RADIOLOGIST EVAL & MGMT  11/21/2017   IR RADIOLOGIST EVAL & MGMT  02/13/2018   IR RADIOLOGIST EVAL & MGMT  01/08/2019   IR RADIOLOGIST EVAL & MGMT  12/29/2019   IR RADIOLOGIST EVAL & MGMT  01/31/2021   IR RADIOLOGIST EVAL & MGMT  12/20/2021   LAPAROSCOPIC ABDOMINAL EXPLORATION N/A 01/06/2013   Procedure: LAPAROSCOPIC ABDOMINAL EXPLORATION;  Surgeon: Elspeth KYM Schultze, MD;  Location: [facility] ORS;  Service: General;  Laterality: N/A;   LAPAROSCOPIC LYSIS OF ADHESIONS N/A 01/06/2013   Procedure: LAPAROSCOPIC LYSIS OF ADHESIONS/ INTEROTOMY REPAIR;  Surgeon: Elspeth KYM Schultze, MD;  Location: [facility] ORS;  Service: General;  Laterality: N/A;   LAPAROTOMY N/A 11/27/2012   Procedure: EXPLORATORY LAPAROTOMY SIGMOID COLECTOMY, COLOSTOMY;  Surgeon: Redell Faith, DO;  Location: [facility] ORS;  Service: General;  Laterality: N/A;   RADIOLOGY WITH ANESTHESIA Left 10/23/2017   Procedure: CT RENAL CRYO AND BIOPSY;  Surgeon: Luverne Aran, MD;  Location: [facility] ORS;  Service: Radiology;  Laterality: Left;   TOTAL KNEE ARTHROPLASTY Bilateral 11/2006    Social History:   reports that she quit smoking about 11 years ago. Her smoking use included cigarettes. She smoked an average of 1 pack per day. She has never used smokeless tobacco. She reports current drug use. Drug: Hydrocodone . She reports that she does not drink alcohol.  Allergies[1]  Family History  Problem Relation Age of Onset   Heart attack Neg Hx    Colon cancer Neg Hx    Osteoporosis Neg Hx      Prior to Admission medications  Medication Sig Start Date End Date Taking? Authorizing Provider  Abatacept  125 MG/ML SOSY Inject 125 mg into the skin once a week. Wednesday    [provider]  albuterol  (VENTOLIN  HFA) 108 (90 Base) MCG/ACT inhaler INHALE 1 PUFF INTO THE LUNGS EVERY 4 HOURS AS NEEDED FOR WHEEZING OR SHORTNESS OF BREATH 12/06/23   [name] LABOR, MD  allopurinol  (ZYLOPRIM ) 100 MG tablet TAKE 1 TABLET BY MOUTH EVERY DAY 04/20/24   [name] LABOR, MD  aspirin  EC 81 MG tablet Take 1 tablet (81 mg total) by mouth daily. 05/11/14   Gabriel Noa, MD  betamethasone  dipropionate 0.05 % lotion Apply 1 Application topically as needed. Patient not taking: Reported on 06/17/2024 03/11/23   [provider]  CALCIUM  PO Take 1 tablet by mouth 2 (two) times daily. 600 mg of calcium  with vitamin D     [provider]  cetirizine  (ZYRTEC ) 10 MG tablet Take 1 tablet (10 mg total) by mouth daily. 11/17/14   [name] LABOR, MD  Cholecalciferol  (VITAMIN D ) 2000 units CAPS Take 1,000 Units by mouth daily.    [provider]  ciprofloxacin -dexamethasone  (CIPRODEX ) OTIC suspension Place 4 drops into both ears 2 (two) times daily. Patient not taking: Reported on 06/17/2024  10/17/21   [name] LABOR, MD  clobetasol  ointment (TEMOVATE ) 0.05 % Apply 1 Application topically 2 (two) times daily. Patient not taking: Reported on 06/17/2024 03/01/23   [name] LABOR, MD  diclofenac sodium (VOLTAREN) 1 % GEL Apply 1 application topically 4 (four) times daily as needed (arthritis). Patient not taking: Reported on 06/17/2024    [provider]  docusate sodium  (COLACE) 100 MG capsule Take 100 mg by mouth 2 (two) times daily.    [provider]  esomeprazole  (NEXIUM ) 40 MG capsule TAKE 1 CAPSULE BY MOUTH EVERY DAY AT NOON 06/12/24   [name] LABOR, MD  folic acid  (FOLVITE ) 400 MCG tablet Take 400 mcg by mouth daily.    [provider]  HYDROcodone -acetaminophen  (NORCO) 10-325 MG tablet Take 1 tablet by mouth every 6 (six) hours as needed for moderate pain (pain score 4-6) or severe pain (pain score  7-10). 06/19/24   Amin, Ankit C, MD  hydrocortisone  2.5 % cream Apply 1 Application topically 2 (two) times daily as needed. Patient not taking: Reported on 06/17/2024 03/07/23   [provider]  ketoconazole  (NIZORAL ) 2 % shampoo Apply 1 application topically 2 (two) times a week. 03/04/20   [provider]  Methotrexate  Sodium (METHOTREXATE , PF,) 50 MG/2ML injection Inject 0.5 mLs into the muscle once a week. Wednesday 05/14/16   [provider]  metoprolol  succinate (TOPROL -XL) 25 MG 24 hr tablet Take 1 tablet (25 mg total) by mouth daily. 01/22/24   [name] LABOR, MD  Multiple Vitamin (MULTIVITAMIN WITH MINERALS) TABS tablet Take 1 tablet by mouth daily.    [provider]  mupirocin  ointment (BACTROBAN ) 2 % APPLY TOPICALLY TO THE AFFECTED AREA TWICE DAILY 01/24/24   [name] LABOR, MD  PERIDEX  0.12 % solution Use as directed 15 mLs in the mouth or throat 2 (two) times daily as needed (gingivitis). Patient not taking: Reported on 06/17/2024 03/23/20   [provider]  polyethylene glycol (MIRALAX  / GLYCOLAX ) 17 g  packet Take 17 g by mouth daily as needed for mild constipation. 05/05/21   [name] LABOR, MD  predniSONE  (DELTASONE ) 5 MG tablet Take 5-10 mg by mouth daily as needed (rheumatoid arthritis - pain).    [provider]  tiZANidine  (ZANAFLEX ) 4 MG tablet Take 1 tablet (4 mg total) by mouth 2 (two) times daily as needed for muscle spasms. TAKE 1 TABLET(4 MG) BY MOUTH TWICE DAILY 06/19/24   Amin, Ankit C, MD  traMADol  (ULTRAM ) 50 MG tablet Take 1 tablet (50 mg total) by mouth 2 (two) times daily. 06/19/24   Amin, Ankit C, MD  triamcinolone  lotion (KENALOG ) 0.1 % Apply topically 2 (two) times daily. Patient not taking: Reported on 06/17/2024 11/09/22   [name] LABOR, MD  vitamin B-12 (CYANOCOBALAMIN ) 500 MCG tablet Take 500 mcg by mouth daily.    [provider]  vitamin C  (ASCORBIC ACID ) 500 MG tablet Take 500 mg by mouth daily.     [provider]    Physical Exam: Vitals:   06/29/24 2245 06/29/24 2315 06/29/24 2355 06/30/24 0015  BP: (!) 142/115 129/80  136/77  Pulse: (!) 46 (!) 109  (!) 51  Resp: 17 (!) 25  10  Temp:   98.4 F (36.9 C)   TempSrc:   Oral   SpO2: 95% 97%  94%  Weight:      Height:         Constitutional: NAD, no pallor or diaphoresis   Eyes: Blind. Lids and conjunctivae normal ENMT: Mucous membranes are moist. Posterior pharynx clear of any exudate or lesions.   Neck: supple, no masses  Respiratory:  no wheezing, no crackles. No accessory muscle use.  Cardiovascular: Rate ~100 and irregularly irregular. No JVD. Abdomen: Distended, soft, no guarding. Occasional high-pitched bowel sound.  Musculoskeletal: no clubbing / cyanosis. Ulnar deviation of fingers bilaterally.   Skin: no significant rashes, lesions, ulcers. Warm, dry, well-perfused. Neurologic: Alert, interactive. Moving all extremities.    Psychiatric: Calm. Cooperative.    Labs and Imaging on Admission: I have personally reviewed following labs and imaging studies  CBC: Recent  Labs  Lab 06/29/24 2010 06/29/24 2025  WBC 7.8  --   NEUTROABS 6.4  --   HGB 14.2 15.3*  HCT 44.5 45.0  MCV 96.9  --   PLT 418*  --    Basic Metabolic Panel: Recent Labs  Lab 06/29/24 2010 06/29/24 2025  NA 143 141  K 4.7 4.5  CL 99 102  CO2 27  --   GLUCOSE 136* 134*  BUN 32* 33*  CREATININE 0.93 1.00  CALCIUM  11.2*  --    GFR: Estimated Creatinine Clearance: 54 mL/min (by C-G formula based on SCr of 1 mg/dL). Liver Function Tests: Recent Labs  Lab 06/29/24 2010  AST 47*  ALT 38  ALKPHOS 151*  BILITOT 0.8  PROT 8.3*  ALBUMIN 4.5   Recent Labs  Lab 06/29/24 2010  LIPASE 25   No results for input(s): AMMONIA in the last 168 hours. Coagulation Profile: No results for input(s): INR, PROTIME in the last 168 hours. Cardiac Enzymes: No results for input(s): CKTOTAL, CKMB, CKMBINDEX, TROPONINI in the last 168 hours. BNP (last 3 results) No results for input(s): PROBNP in the last 8760 hours. HbA1C: No results for input(s): HGBA1C in the last 72 hours. CBG: No results for input(s): GLUCAP in the last 168 hours. Lipid Profile: No results for input(s): CHOL, HDL, LDLCALC, TRIG, CHOLHDL, LDLDIRECT in the last 72 hours. Thyroid  Function Tests: No results for input(s): TSH, T4TOTAL, FREET4, T3FREE, THYROIDAB in the last 72 hours. Anemia Panel: No results for input(s): VITAMINB12, FOLATE, FERRITIN, TIBC, IRON, RETICCTPCT in the last 72 hours. Urine analysis:    Component Value Date/Time   COLORURINE YELLOW 09/27/2022 0422   APPEARANCEUR CLEAR 09/27/2022 0422   LABSPEC >=1.030 09/27/2022 0422   PHURINE 5.0 09/27/2022 0422   GLUCOSEU NEGATIVE 09/27/2022 0422   HGBUR NEGATIVE 09/27/2022 0422   BILIRUBINUR neg 12/04/2022 1111   KETONESUR 20 (A) 09/27/2022 0422   PROTEINUR Positive (A) 12/04/2022 1111   PROTEINUR 30 (A) 09/27/2022 0422   UROBILINOGEN 0.2 12/04/2022 1111   UROBILINOGEN 0.2 11/22/2014 0929    NITRITE neg 12/04/2022 1111   NITRITE NEGATIVE 09/27/2022 0422   LEUKOCYTESUR Large (3+) (A) 12/04/2022 1111   LEUKOCYTESUR NEGATIVE 09/27/2022 0422   Sepsis Labs: @LABRCNTIP (procalcitonin:4,lacticidven:4) )No results found for this or any previous visit (from the past 240 hours).   Radiological Exams on Admission: CT ABDOMEN PELVIS W CONTRAST Result Date: 06/29/2024 EXAM: CT ABDOMEN AND PELVIS WITH CONTRAST 06/29/2024 09:34:41 PM TECHNIQUE: CT of the abdomen and pelvis was performed with the administration of 70 mL of iohexol  (OMNIPAQUE ) 350 MG/ML injection. Multiplanar reformatted images are provided for review. Automated exposure control, iterative reconstruction, and/or weight-based adjustment of the mA/kV was utilized to reduce the radiation dose to as low as reasonably achievable. COMPARISON: CT of the pelvis without contrast 06/16/2024 and CT abdomen without and with contrast 11/26/2022. CLINICAL HISTORY: Abdominal pain, acute, nonlocalized. Severe nausea and vomiting and suspected bowel obstruction. 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 FINDINGS: LOWER CHEST: There is mild cardiomegaly. No pericardial effusion. Left main and 2-vessel coronary calcifications again noted in the LAD and circumflex arteries, greatest in the LAD. Similar to the CTA chest of 05/01/2014, there is homogeneous soft tissue in the prevascular mediastinal space measuring 3.1 x 1.6 cm on series 2, axial image 3. 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 There is a 4 mm chronic right middle lobe nodule laterally on series 4 image 42, benign given length of stability. Lung bases are otherwise clear. LIVER: The liver again demonstrates benign cysts in the left lobe, measuring 4.5 cm in segment 4b, 1.4 cm in segment 3. No liver mass is seen. GALLBLADDER AND BILE DUCTS: Gallbladder is unremarkable. No  biliary ductal dilatation. SPLEEN: No acute  abnormality. PANCREAS: There is mild chronic prominence of the proximal pancreatic duct, with otherwise unremarkable pancreas. ADRENAL GLANDS: No acute abnormality. KIDNEYS, URETERS AND BLADDER: Scarring changes are noted in both kidneys. There are post-treatment changes in the posterior medial interpolar left renal cortex. No recurrent mass is seen. No urinary stone or obstruction. Bilateral bladder diverticula appear similar. No bladder wall thickening is evident. GI AND BOWEL: There is mild fluid filling in the stomach. There is dilatation in the proximal to mid small bowel up to 4.3 cm. There is a short transitional segment on series 2 axial image 45, just anterior to where the left common iliac artery bifurcates in the central abdomen just left of the midline. Etiology is likely adhesive disease. A large ventral hernia at and below the umbilical level continues to be seen, today containing both dilated and decompressed segments and a portion of the transverse colon. I could not strictly exclude a closed-loop obstruction within the hernia. There is no bowel pneumatosis. Small bowel in the right mid and lower abdomen and right hemipelvis is completely collapsed. There is mild stool retention in the ascending colon. There is diffuse diverticulosis in the transverse and left colon without evidence of diverticulitis. PERITONEUM AND RETROPERITONEUM: No ascites. No free air. No free hemorrhage, localizing collections or portal venous gas. VASCULATURE: Aorta is normal in caliber. There is extensive aortoiliac calcific plaque. Branch vessel atherosclerosis is present but less heavy. LYMPH NODES: No lymphadenopathy. REPRODUCTIVE ORGANS: No acute abnormality. BONES AND SOFT TISSUES: Severe thoracolumbar levoscoliosis, apex L1 with advanced degenerative changes, multilevel vertebral body ankylosis. Bone graft harvest defects chronically noted in the posterior left ilium. There is advanced severe arthrosis of the right  greater than left hips with acetabular protrusio deformity. Osteopenia. Again noted is the recent intraarticular nondisplaced fracture of the posterior column of the right acetabulum and a chronic healed fracture deformity of the right inferior pubic ramus. No focal soft tissue abnormality. IMPRESSION: 1. Dilated proximal to mid small bowel up to 4.3 cm with a short transitional segment in the central abdomen, likely due to adhesive disease, without bowel pneumatosis. 2. Large ventral hernia at and below the umbilical level containing both dilated and decompressed bowel and a portion of the transverse colon, with closed-loop obstruction within the hernia sac not strictly excluded. 3. Post-treatment changes in the left kidney without recurrent mass, and no evidence of metastatic disease. 4. Recent nondisplaced fracture of the posterior column of the right acetabulum. 5. Osteopenia, severe scoliosis, and degenerative changes as above . [name] 6. Coronary artery and aortic  atherosclerosis. Electronically signed by: Francis Quam MD 06/29/2024 10:20 PM EST RP Workstation: [id number]    EKG: Independently reviewed. Atrial fibrillation, PVCs, LVH.   Assessment/Plan   1. Bowel obstruction  - Pt with hx of perforated diverticulitis s/p sigmoid colectomy and multiple subsequent bowel obstructions presents with abdominal pain and N/V  - Appreciate surgery consultation  - Plan for bowel rest, NGT decompression, IVF, monitor/correct electrolytes   2. Atrial fibrillation  - Hx of atrial flutter s/p ablation, not anticoagulated, appears to be in a fib in ED  - Continue metoprolol , consider anticoagulating   3. Chronic HFimpEF  - Hx of CHF with improved EF, LV EF was 60-65% in August 2024  - Appears compensated - Monitor weight and I/Os    4. Right acetabular fracture   - Evaluated by ortho in ED on 12/24 with recommendations for conservative management  - Continue  PT, outpatient ortho follow-up     DVT  prophylaxis: SCDs  Code Status: Full  Level of Care: Level of care: Telemetry Family Communication: None present  Disposition Plan:  Patient is from: SNF  Anticipated d/c is to: SNF  Anticipated d/c date is: 07/03/23  Patient currently: Pending return of bowel function  Consults called: Surgery  Admission status: Inpatient     Evalene GORMAN Sprinkles, MD [facility]  06/30/2024, 12:30 AM       [1]  Allergies Allergen Reactions   Rosuvastatin  Diarrhea, Itching and Other (See Comments)    Severe myalgia    Spinach Itching, Rash and Other (See Comments)    Welts.   "

## 2024-06-30 ENCOUNTER — Inpatient Hospital Stay (HOSPITAL_COMMUNITY)

## 2024-06-30 DIAGNOSIS — S32401D Unspecified fracture of right acetabulum, subsequent encounter for fracture with routine healing: Secondary | ICD-10-CM | POA: Diagnosis not present

## 2024-06-30 DIAGNOSIS — M069 Rheumatoid arthritis, unspecified: Secondary | ICD-10-CM | POA: Diagnosis not present

## 2024-06-30 DIAGNOSIS — K56609 Unspecified intestinal obstruction, unspecified as to partial versus complete obstruction: Secondary | ICD-10-CM | POA: Diagnosis not present

## 2024-06-30 LAB — MAGNESIUM: Magnesium: 1.7 mg/dL (ref 1.7–2.4)

## 2024-06-30 LAB — TSH: TSH: 0.737 u[IU]/mL (ref 0.350–4.500)

## 2024-06-30 MED ORDER — SODIUM CHLORIDE 0.9 % IV SOLN: INTRAVENOUS | Status: DC

## 2024-06-30 MED ORDER — PROCHLORPERAZINE EDISYLATE 10 MG/2ML IJ SOLN
5.0000 mg | Freq: Four times a day (QID) | INTRAMUSCULAR | Status: DC | PRN
  Administered 2024-06-30: 5 mg via INTRAVENOUS
  Filled 2024-06-30: qty 2

## 2024-06-30 NOTE — Plan of Care (Signed)
" °  Problem: Health Behavior/Discharge Planning: Goal: Ability to manage health-related needs will improve Outcome: Progressing   Problem: Clinical Measurements: Goal: Will remain free from infection Outcome: Progressing   Problem: Activity: Goal: Risk for activity intolerance will decrease Outcome: Progressing   Problem: Pain Managment: Goal: General experience of comfort will improve and/or be controlled Outcome: Progressing   Problem: Safety: Goal: Ability to remain free from injury will improve Outcome: Progressing   Problem: Safety: Goal: Ability to remain free from injury will improve Outcome: Progressing   Problem: Skin Integrity: Goal: Risk for impaired skin integrity will decrease Outcome: Progressing   "

## 2024-06-30 NOTE — TOC Initial Note (Signed)
 Transition of Care [facility]) - Initial/Assessment Note    Patient Details  Name: [name] MRN: [id number] Date of Birth: May 22, 1956  Transition of Care [facility]) CM/SW Contact:    Jeoffrey LITTIE Maranda ISRAEL Phone Number: 06/30/2024, 10:38 AM  Clinical Narrative:                 Pt admitted from [facility] due to abnormal x-ray. CSW following for needs.     Barriers to Discharge: Continued Medical Work up   Patient Goals and CMS Choice            Expected Discharge Plan and Services       Living arrangements for the past 2 months: Apartment                                      Prior Living Arrangements/Services Living arrangements for the past 2 months: Apartment Lives with:: Self Patient language and need for interpreter reviewed:: Yes Do you feel safe going back to the place where you live?: Yes      Need for Family Participation in Patient Care: No (Comment) Care giver support system in place?: Yes (comment)   Criminal Activity/Legal Involvement Pertinent to Current Situation/Hospitalization: No - Comment as needed  Activities of Daily Living   ADL Screening (condition at time of admission) Independently performs ADLs?: No Does the patient have a NEW difficulty with bathing/dressing/toileting/self-feeding that is expected to last >3 days?: No Does the patient have a NEW difficulty with getting in/out of bed, walking, or climbing stairs that is expected to last >3 days?: No Does the patient have a NEW difficulty with communication that is expected to last >3 days?: Yes (Initiates electronic notice to provider for possible SLP consult) Is the patient deaf or have difficulty hearing?: Yes Does the patient have difficulty seeing, even when wearing glasses/contacts?: Yes Does the patient have difficulty concentrating, remembering, or making decisions?: No  Permission Sought/Granted Permission sought to share information with : Family Supports    Share Information  with NAME: Hortencia     Permission granted to share info w Relationship: Daughter  Permission granted to share info w Contact Information: [phone]  Emotional Assessment Appearance:: Appears stated age Attitude/Demeanor/Rapport: Engaged Affect (typically observed): Pleasant Orientation: : Oriented to Self, Oriented to Place, Oriented to  Time, Oriented to Situation Alcohol / Substance Use: Not Applicable Psych Involvement: No (comment)  Admission diagnosis:  SBO (small bowel obstruction) (HCC) [K56.609] Patient Active Problem List   Diagnosis Date Noted   SBO (small bowel obstruction) (HCC) 06/29/2024   Closed right acetabular fracture (HCC) 06/18/2024   Polyarthralgia 06/18/2024   Seborrhea capitis 03/19/2023   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) 09/26/2022   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/2022   History of colonic diverticulitis 09/26/2022   Perforation of left tympanic membrane 12/11/2021   Vitamin D  deficiency 12/05/2021   Chronic combined systolic and diastolic heart failure (HCC) 01/23/2021   Malnutrition of moderate degree 05/11/2020   Small bowel obstruction due to adhesions (HCC) 05/07/2020   Left renal mass 10/23/2017   Hx of adenomatous polyp of colon 11/13/2016   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   Renal mass    Pulmonary HTN (HCC) 01/01/2014   AKI (acute kidney injury) 01/01/2014   Iron deficiency anemia 01/01/2014   Atrial flutter (HCC) 12/31/2013   Stoma dermatitis 11/30/2013   Incisional hernia 11/30/2013  Constipation, chronic 11/30/2013   Charcot's joint of foot 07/07/2013   Bilateral leg edema 04/20/2013   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 11/28/2012 01/05/2013   Rheumatoid arthritis (HCC) 06/07/2009   THYROID  NODULE 07/01/2007   LUNG NODULE 07/01/2007   Thyroid  nodule 07/01/2007   Osteoporosis 05/07/2007   ABUSE, ALCOHOL, 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/12/2007   Legally blind  04/12/2007   HEMORRHOIDS, INTERNAL 04/12/2007   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/14/2007   Hyperlipidemia 01/07/2007   Essential hypertension 01/07/2007   GERD 01/07/2007   Marfan's syndrome 01/07/2007   Stroke (HCC) 07/2006   Chronic gout 2008   PCP:  [name] LABOR, MD Pharmacy:   [facility] [phone] [address] [place], [place] - [address] AT [address] [place] [address] Phone: [phone] Fax: [phone]  [facility] [address] [place] [address] Phone: [phone] Fax: [phone]  [facility] [place], [place] - [phone] [address] [place] [address] Phone: [phone] Fax: [phone]     Social Drivers of Health (SDOH) Social History: SDOH Screenings   Food Insecurity: No Food Insecurity (06/30/2024)  Housing: Low Risk (06/30/2024)  Transportation Needs: No Transportation Needs (06/30/2024)  Utilities: Not At Risk (06/30/2024)  Depression (PHQ2-9): Low Risk (10/01/2023)  Financial Resource Strain: Low Risk (10/16/2021)  Physical Activity: Inactive (10/16/2021)  Social Connections: Moderately Isolated (06/30/2024)  Stress: Stress Concern Present (10/16/2021)  Tobacco Use: Medium Risk (06/29/2024)   SDOH Interventions:     Readmission Risk Interventions    06/19/2024   12:56 PM  Readmission Risk Prevention Plan  Post Dischage Appt Complete  Medication Screening Complete  Transportation Screening Complete

## 2024-06-30 NOTE — Progress Notes (Signed)
 " Progress Note    [name]   FMW:[id number]  DOB: 04-18-1956  DOA: 06/29/2024     1 PCP: [name] LABOR, MD  Initial CC: abd pain  Hospital Course: [name] is a 69 year old female with PMH A-flutter s/p ablation, HTN, CVA, CHF, severe rheumatoid arthritis, bilateral blindness, perforated diverticulitis 2014 s/p sigmoid colectomy with ostomy in place, multiple bowel obstructions, right acetabular fracture last month who presented with nausea, vomiting, abdominal pain.  CT A/P on admission showed dilated proximal and mid small bowel with a short transitional segment noted in the central abdomen from presumed underlying adhesions causing bowel obstruction.  Known large ventral hernia at the umbilical level felt to possibly be closed-loop obstruction on CT but after evaluation by surgery, not felt to be consistent with that.  She was recommended for conservative management for now and placement of NG tube.  Interval History:  Admitted yesterday with SBO.  Denies much abdominal pain this morning and nausea is better after NG tube placement.  Assessment and Plan: * SBO (small bowel obstruction) (HCC) - History of bowel obstructions and sigmoid colectomy.  Ostomy in place with minimal output prior to admission -CT showing SBO concerning for underlying adhesions -Some concern on CT for closed-loop obstruction involving ventral hernia however not felt to represent this after evaluation by surgery - For now conservative management with NG tube.  Monitor output - Continue pain and nausea control - Keep n.p.o. - Continue fluids  Atrial flutter (HCC) - Continue IV Lopressor  - Not on anticoagulation; history of ablation but A-fib recurred  Closed right acetabular fracture (HCC) - known on CT: Recent nondisplaced fracture of the posterior column of the right acetabulum.  - Hospitalized 12/22 - 12/26 after a fall resulting in right acetabular fracture.  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) - Prior echo August 2024: EF 60 to 65%, no RWMA, mild LVH, grade 1 DD -Well compensated  History of CVA (cerebrovascular accident) - On aspirin  prior to admission   Antimicrobials:   Consultants:  General surgery  Procedures:    DVT prophylaxis:  SCDs Start: 06/29/24 2340   Code Status:   Code Status: Full Code  Barriers to discharge: None Therapy evaluation: PT Orders:   PT Follow up Rec:   Disposition Plan: Back to [facility] tentatively Status is: Inpatient  Mobility Assessment (Last 72 Hours)     Mobility Assessment     Row Name 06/30/24 0245 06/29/24 2045         Does the patient have exclusion criteria? No- Perform mobility assessment -- (P)       What is the highest level of mobility based on the mobility assessment? Level 3 (Stands with assistance) - Balance while standing  and cannot march in place -- (P)       Is the above level different from baseline mobility prior to current illness? Yes - Recommend PT order -- (P)          Diet: Diet Orders (From admission, onward)     Start     Ordered   06/29/24 2340  Diet NPO time specified  Diet effective now        06/29/24 2343  Objective: Blood pressure 108/87, pulse 82, temperature 97.8 F (36.6 C), resp. rate 18, height 5' 8 (1.727 m), weight 62.8 kg, last menstrual period 06/25/2006, SpO2 100%.  Examination:  Physical Exam Constitutional:      Comments: Frail elderly woman resting in bed in no distress.  Complete blindness noted  HENT:     Head: Normocephalic and atraumatic.     Mouth/Throat:     Mouth: Mucous membranes are dry.  Eyes:     Comments: Degenerated right eye.  Artificial left eye  Cardiovascular:     Rate and  Rhythm: Normal rate.  Pulmonary:     Effort: Pulmonary effort is normal. No respiratory distress.     Breath sounds: Normal breath sounds. No wheezing.  Abdominal:     General: There is distension.     Palpations: Abdomen is soft.     Tenderness: There is no abdominal tenderness.     Comments: Large anterior abdominal hernia noted with no tenderness and soft.  Bowel sounds noted throughout  Musculoskeletal:     Cervical back: Normal range of motion and neck supple.     Comments: Severely contracted UE with malformed joints in both hands from severe RA  Skin:    General: Skin is warm and dry.  Neurological:     Mental Status: Mental status is at baseline.      Data Reviewed: Results for orders placed or performed during the hospital encounter of 06/29/24 (from the past 24 hours)  CBC with Differential     Status: Abnormal   Collection Time: 06/29/24  8:10 PM  Result Value Ref Range   WBC 7.8 4.0 - 10.5 K/uL   RBC 4.59 3.87 - 5.11 MIL/uL   Hemoglobin 14.2 12.0 - 15.0 g/dL   HCT 55.4 63.9 - 53.9 %   MCV 96.9 80.0 - 100.0 fL   MCH 30.9 26.0 - 34.0 pg   MCHC 31.9 30.0 - 36.0 g/dL   RDW 85.9 88.4 - 84.4 %   Platelets 418 (H) 150 - 400 K/uL   nRBC 0.3 (H) 0.0 - 0.2 %   Neutrophils Relative % 82 %   Neutro Abs 6.4 1.7 - 7.7 K/uL   Lymphocytes Relative 14 %   Lymphs Abs 1.1 0.7 - 4.0 K/uL   Monocytes Relative 4 %   Monocytes Absolute 0.3 0.1 - 1.0 K/uL   Eosinophils Relative 0 %   Eosinophils Absolute 0.0 0.0 - 0.5 K/uL   Basophils Relative 0 %   Basophils Absolute 0.0 0.0 - 0.1 K/uL   Immature Granulocytes 0 %   Abs Immature Granulocytes 0.02 0.00 - 0.07 K/uL  Comprehensive metabolic panel     Status: Abnormal   Collection Time: 06/29/24  8:10 PM  Result Value Ref Range   Sodium 143 135 - 145 mmol/L   Potassium 4.7 3.5 - 5.1 mmol/L   Chloride 99 98 - 111 mmol/L   CO2 27 22 - 32 mmol/L   Glucose, Bld 136 (H) 70 - 99 mg/dL   BUN 32 (H) 8 - 23 mg/dL   Creatinine, Ser 9.06  0.44 - 1.00 mg/dL   Calcium  11.2 (H) 8.9 - 10.3 mg/dL   Total Protein 8.3 (H) 6.5 - 8.1 g/dL   Albumin 4.5 3.5 - 5.0 g/dL   AST 47 (H) 15 - 41 U/L   ALT 38 0 - 44 U/L   Alkaline Phosphatase 151 (H) 38 - 126 U/L   Total Bilirubin 0.8 0.0 - 1.2 mg/dL  GFR, Estimated >60 >60 mL/min   Anion gap 17 (H) 5 - 15  Lipase, blood     Status: None   Collection Time: 06/29/24  8:10 PM  Result Value Ref Range   Lipase 25 11 - 51 U/L  I-stat chem 8, ED (not at [facility], [facility] or [facility]     Status: Abnormal   Collection Time: 06/29/24  8:25 PM  Result Value Ref Range   Sodium 141 135 - 145 mmol/L   Potassium 4.5 3.5 - 5.1 mmol/L   Chloride 102 98 - 111 mmol/L   BUN 33 (H) 8 - 23 mg/dL   Creatinine, Ser 8.99 0.44 - 1.00 mg/dL   Glucose, Bld 865 (H) 70 - 99 mg/dL   Calcium , Ion 1.28 1.15 - 1.40 mmol/L   TCO2 27 22 - 32 mmol/L   Hemoglobin 15.3 (H) 12.0 - 15.0 g/dL   HCT 54.9 63.9 - 53.9 %  Magnesium      Status: None   Collection Time: 06/30/24  4:26 AM  Result Value Ref Range   Magnesium  1.7 1.7 - 2.4 mg/dL  TSH     Status: None   Collection Time: 06/30/24  4:26 AM  Result Value Ref Range   TSH 0.737 0.350 - 4.500 uIU/mL    I have reviewed pertinent nursing notes, vitals, labs, and images as necessary. I have ordered labwork to follow up on as indicated.  I have reviewed the last notes from staff over past 24 hours. I have discussed patient's care plan and test results with nursing staff, CM/SW, and other staff as appropriate.  Old records reviewed in assessment of this patient  Time spent: Greater than 50% of the 55 minute visit was spent in counseling/coordination of care for the patient as laid out in the A&P.   LOS: 1 day   Alm Apo, MD [facility] 06/30/2024, 11:33 AM "

## 2024-06-30 NOTE — Assessment & Plan Note (Addendum)
-   History of bowel obstructions and sigmoid colectomy.  Ostomy in place with minimal output prior to admission -CT showing SBO concerning for underlying adhesions -Some concern on CT for closed-loop obstruction involving ventral hernia however not felt to represent this after evaluation by surgery General Surgery was consulted.  Treated conservatively with bowel rest, NG tube, IVF with improvement.  NG tube was removed on 1/6.  Diet was advanced to soft diet and General Surgery signed off 1/7. However on 1/8, patient reported recurrent abdominal fullness without significant any stool in colostomy for approximately 48 hours prior. KUB showed nonspecific bowel gas pattern with diffuse gas-filled small bowel loops and colon. Component of underlying ileus not excluded.  Reengaged CCS, s/p SB protocol 1/8 followed by good stool output through colostomy.  Follow-up KUB shows no evidence of bowel obstruction.  General surgery signed off. Since then, no significant GI symptoms but did not have much of colostomy output for 2 days.  KUB this unremarkable as below.  Now having colostomy output.  SBO resolved

## 2024-06-30 NOTE — Hospital Course (Addendum)
 [name] is a 69 year old female with PMH A-flutter s/p ablation, HTN, CVA, CHF, severe rheumatoid arthritis, bilateral blindness, perforated diverticulitis 2014 s/p sigmoid colectomy with ostomy in place, multiple bowel obstructions, right acetabular fracture last month who presented with nausea, vomiting, abdominal pain.  CT A/P on admission showed dilated proximal and mid small bowel with a short transitional segment noted in the central abdomen from presumed underlying adhesions causing bowel obstruction.  Known large ventral hernia at the umbilical level felt to possibly be closed-loop obstruction on CT but after evaluation by surgery, not felt to be consistent with that.  She was recommended for conservative management for now and placement of NG tube. General surgery consulted, managed conservatively with bowel rest, NG tube with improvement, NG tube removed 1/6.  S/p SB protocol 1/8.  SBO resolved.

## 2024-06-30 NOTE — Assessment & Plan Note (Addendum)
-   Continue Lopressor  - Not on anticoagulation; history of ablation but A-fib recurred

## 2024-06-30 NOTE — Assessment & Plan Note (Signed)
-   Prior echo August 2024: EF 60 to 65%, no RWMA, mild LVH, grade 1 DD -Well compensated

## 2024-06-30 NOTE — Assessment & Plan Note (Signed)
-   known on CT: Recent nondisplaced fracture of the posterior column of the right acetabulum.  - Hospitalized 12/22 - 12/26 after a fall resulting in right acetabular fracture.  Recommended for 50% WB on right side with ambulation and WBAT for transfers - outpt follow upwith ortho 2-3 weeks post fall

## 2024-06-30 NOTE — Assessment & Plan Note (Signed)
-   Severe contractures noted in bilateral upper extremities

## 2024-06-30 NOTE — Plan of Care (Signed)
  Problem: Health Behavior/Discharge Planning: Goal: Ability to manage health-related needs will improve Outcome: Progressing   Problem: Nutrition: Goal: Adequate nutrition will be maintained Outcome: Progressing   Problem: Elimination: Goal: Will not experience complications related to bowel motility Outcome: Progressing   Problem: Pain Managment: Goal: General experience of comfort will improve and/or be controlled Outcome: Progressing

## 2024-06-30 NOTE — Assessment & Plan Note (Signed)
-   On aspirin  prior to admission

## 2024-06-30 NOTE — Progress Notes (Signed)
 "  Progress Note     Subjective: Patient denies pain. She feels that this is an improvement from yesterday. Reports that nursing staff changed her ostomy appliance due to stool output this morning. No stool in pouch currently. Denies nausea and vomiting. Currently NPO.  ROS  All negative with the exception of above.  Objective: Vital signs in last 24 hours: Temp:  [97.8 F (36.6 C)-98.6 F (37 C)] 98.2 F (36.8 C) (01/06 0529) Pulse Rate:  [46-119] 87 (01/06 0529) Resp:  [10-27] 16 (01/06 0529) BP: (115-149)/(66-115) 115/66 (01/06 0529) SpO2:  [94 %-100 %] 100 % (01/06 0529) Weight:  [62.8 kg-63.5 kg] 62.8 kg (01/06 0500) Last BM Date : 06/30/24  Intake/Output from previous day: 01/05 0701 - 01/06 0700 In: 327.3 [I.V.:327.3] Out: 400 [Emesis/NG output:400] Intake/Output this shift: No intake/output data recorded.  PE: General: Pleasant female who is laying in bed in NAD. HEENT: Head is normocephalic, atraumatic. NGT in place. Heart: HR normal during encounter.   Lungs: Respiratory effort nonlabored. Abd: Soft, NT. Large ventral hernia noted that reduces with palpation. Ostomy noted of left abdomen. Stoma viable. No stool in collection bag. No rebound tenderness or guarding. Skin: Warm and dry. Psych: A&Ox3 with an appropriate affect.    Lab Results:  Recent Labs    06/29/24 2010 06/29/24 2025  WBC 7.8  --   HGB 14.2 15.3*  HCT 44.5 45.0  PLT 418*  --    BMET Recent Labs    06/29/24 2010 06/29/24 2025  NA 143 141  K 4.7 4.5  CL 99 102  CO2 27  --   GLUCOSE 136* 134*  BUN 32* 33*  CREATININE 0.93 1.00  CALCIUM  11.2*  --    PT/INR No results for input(s): LABPROT, INR in the last 72 hours. CMP     Component Value Date/Time   NA 141 06/29/2024 2025   NA 143 09/10/2016 0000   K 4.5 06/29/2024 2025   CL 102 06/29/2024 2025   CO2 27 06/29/2024 2010   GLUCOSE 134 (H) 06/29/2024 2025   BUN 33 (H) 06/29/2024 2025   BUN 14 09/10/2016 0000    CREATININE 1.00 06/29/2024 2025   CREATININE 0.76 11/19/2017 1024   CALCIUM  11.2 (H) 06/29/2024 2010   PROT 8.3 (H) 06/29/2024 2010   ALBUMIN 4.5 06/29/2024 2010   AST 47 (H) 06/29/2024 2010   ALT 38 06/29/2024 2010   ALKPHOS 151 (H) 06/29/2024 2010   BILITOT 0.8 06/29/2024 2010   GFRNONAA >60 06/29/2024 2010   GFRNONAA 85 11/19/2017 1024   GFRAA 98 11/19/2017 1024   Lipase     Component Value Date/Time   LIPASE 25 06/29/2024 2010       Studies/Results: DG Abd Portable 1 View Result Date: 06/30/2024 EXAM: 1 VIEW XRAY OF THE ABDOMEN 06/30/2024 01:33:42 AM COMPARISON: 04/17/2023, CT today. CLINICAL HISTORY: NG tube placement verification. FINDINGS: LINES, TUBES AND DEVICES: NG tube tip is in the mid stomach. SOFT TISSUES: No abnormal calcifications. BONES: No acute fracture. IMPRESSION: 1. NG tube tip is in the mid stomach. Electronically signed by: Franky Crease MD 06/30/2024 01:39 AM EST RP Workstation: [id number]   CT ABDOMEN PELVIS W CONTRAST Result Date: 06/29/2024 EXAM: CT ABDOMEN AND PELVIS WITH CONTRAST 06/29/2024 09:34:41 PM TECHNIQUE: CT of the abdomen and pelvis was performed with the administration of 70 mL of iohexol  (OMNIPAQUE ) 350 MG/ML injection. Multiplanar reformatted images are provided for review. Automated exposure control, iterative reconstruction, and/or weight-based adjustment of the mA/kV  was utilized to reduce the radiation dose to as low as reasonably achievable. COMPARISON: CT of the pelvis without contrast 06/16/2024 and CT abdomen without and with contrast 11/26/2022. CLINICAL HISTORY: Abdominal pain, acute, nonlocalized. Severe nausea and vomiting and suspected bowel obstruction. 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 FINDINGS: LOWER CHEST: There is mild cardiomegaly. No pericardial effusion. Left main and 2-vessel coronary calcifications again noted in the LAD and circumflex arteries, greatest in the LAD. Similar to the  CTA chest of 05/01/2014, there is homogeneous soft tissue in the prevascular mediastinal space measuring 3.1 x 1.6 cm on series 2, axial image 3. 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 There is a 4 mm chronic right middle lobe nodule laterally on series 4 image 42, benign given length of stability. Lung bases are otherwise clear. LIVER: The liver again demonstrates benign cysts in the left lobe, measuring 4.5 cm in segment 4b, 1.4 cm in segment 3. No liver mass is seen. GALLBLADDER AND BILE DUCTS: Gallbladder is unremarkable. No biliary ductal dilatation. SPLEEN: No acute abnormality. PANCREAS: There is mild chronic prominence of the proximal pancreatic duct, with otherwise unremarkable pancreas. ADRENAL GLANDS: No acute abnormality. KIDNEYS, URETERS AND BLADDER: Scarring changes are noted in both kidneys. There are post-treatment changes in the posterior medial interpolar left renal cortex. No recurrent mass is seen. No urinary stone or obstruction. Bilateral bladder diverticula appear similar. No bladder wall thickening is evident. GI AND BOWEL: There is mild fluid filling in the stomach. There is dilatation in the proximal to mid small bowel up to 4.3 cm. There is a short transitional segment on series 2 axial image 45, just anterior to where the left common iliac artery bifurcates in the central abdomen just left of the midline. Etiology is likely adhesive disease. A large ventral hernia at and below the umbilical level continues to be seen, today containing both dilated and decompressed segments and a portion of the transverse colon. I could not strictly exclude a closed-loop obstruction within the hernia. There is no bowel pneumatosis. Small bowel in the right mid and lower abdomen and right hemipelvis is completely collapsed. There is mild stool retention in the ascending colon. There is diffuse diverticulosis in the transverse and left colon without evidence of  diverticulitis. PERITONEUM AND RETROPERITONEUM: No ascites. No free air. No free hemorrhage, localizing collections or portal venous gas. VASCULATURE: Aorta is normal in caliber. There is extensive aortoiliac calcific plaque. Branch vessel atherosclerosis is present but less heavy. LYMPH NODES: No lymphadenopathy. REPRODUCTIVE ORGANS: No acute abnormality. BONES AND SOFT TISSUES: Severe thoracolumbar levoscoliosis, apex L1 with advanced degenerative changes, multilevel vertebral body ankylosis. Bone graft harvest defects chronically noted in the posterior left ilium. 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 Osteopenia. Again noted is the recent intraarticular nondisplaced fracture of the posterior column of the right acetabulum and a chronic healed fracture deformity of the right inferior pubic ramus. No focal soft tissue abnormality. IMPRESSION: 1. Dilated proximal to mid small bowel up to 4.3 cm with a short transitional segment in the central abdomen, likely due to adhesive disease, without bowel pneumatosis. 2. Large ventral hernia at and below the umbilical level containing both dilated and decompressed bowel and a portion of the transverse colon, with closed-loop obstruction within the hernia sac not strictly excluded. 3. Post-treatment changes in the left kidney without recurrent mass, and no evidence  of metastatic disease. 4. Recent nondisplaced fracture of the posterior column of the right acetabulum. 5. Osteopenia, severe scoliosis, and degenerative changes as above . SABRA 6. Coronary artery and aortic  atherosclerosis. Electronically signed by: Francis Quam MD 06/29/2024 10:20 PM EST RP Workstation: [id number]    Anti-infectives: Anti-infectives (From admission, onward)    None        Assessment/Plan 69 yo female with bowel obstruction -CT from 1/5 show a loop of intestine moving around the left colon as it goes to the ostomy  site. Not thought to be a closed loop obstruction.  -Xray this AM shows NG tube in mid stomach. -Afebrile. -CBC from 1/5: WBC 7.8 and HGB 14.2 -Denies pain. Having stool output. Denies n/v.  -NGT output 400 mL from 1/5-1/6. -Continue NPO/NGT -Will continue to follow.  FEN: NPO/NGT; IVF per primary team VTE: SCDs ID: None currently   LOS: 1 day   I reviewed consulting provider notes, specialist notes, nursing notes, hospitalist notes, last 24 h vitals and pain scores, last 48 h intake and output, last 24 h labs and trends, and last 24 h imaging results.  This care required moderate level of medical decision making.    Marjorie Carlyon Favre, [facility] 06/30/2024, 10:00 AM Please see Amion for pager number during day hours 7:00am-4:30pm  "

## 2024-06-30 NOTE — Progress Notes (Signed)
NGT removed per order. Pt tolerated well.

## 2024-07-01 DIAGNOSIS — K56609 Unspecified intestinal obstruction, unspecified as to partial versus complete obstruction: Secondary | ICD-10-CM | POA: Diagnosis not present

## 2024-07-01 LAB — CBC WITH DIFFERENTIAL/PLATELET
Abs Immature Granulocytes: 0.07 K/uL (ref 0.00–0.07)
Basophils Absolute: 0 K/uL (ref 0.0–0.1)
Basophils Relative: 1 %
Eosinophils Absolute: 0.2 K/uL (ref 0.0–0.5)
Eosinophils Relative: 5 %
HCT: 34.5 % — ABNORMAL LOW (ref 36.0–46.0)
Hemoglobin: 10.9 g/dL — ABNORMAL LOW (ref 12.0–15.0)
Immature Granulocytes: 1 %
Lymphocytes Relative: 28 %
Lymphs Abs: 1.3 K/uL (ref 0.7–4.0)
MCH: 31.1 pg (ref 26.0–34.0)
MCHC: 31.6 g/dL (ref 30.0–36.0)
MCV: 98.6 fL (ref 80.0–100.0)
Monocytes Absolute: 0.6 K/uL (ref 0.1–1.0)
Monocytes Relative: 12 %
Neutro Abs: 2.6 K/uL (ref 1.7–7.7)
Neutrophils Relative %: 53 %
Platelets: 296 K/uL (ref 150–400)
RBC: 3.5 MIL/uL — ABNORMAL LOW (ref 3.87–5.11)
RDW: 14.3 % (ref 11.5–15.5)
WBC: 4.8 K/uL (ref 4.0–10.5)
nRBC: 0 % (ref 0.0–0.2)

## 2024-07-01 LAB — MAGNESIUM: Magnesium: 1.8 mg/dL (ref 1.7–2.4)

## 2024-07-01 LAB — COMPREHENSIVE METABOLIC PANEL WITH GFR
ALT: 36 U/L (ref 0–44)
AST: 38 U/L (ref 15–41)
Albumin: 3.7 g/dL (ref 3.5–5.0)
Alkaline Phosphatase: 105 U/L (ref 38–126)
Anion gap: 9 (ref 5–15)
BUN: 25 mg/dL — ABNORMAL HIGH (ref 8–23)
CO2: 27 mmol/L (ref 22–32)
Calcium: 8.8 mg/dL — ABNORMAL LOW (ref 8.9–10.3)
Chloride: 104 mmol/L (ref 98–111)
Creatinine, Ser: 0.65 mg/dL (ref 0.44–1.00)
GFR, Estimated: >60 mL/min
Glucose, Bld: 94 mg/dL (ref 70–99)
Potassium: 3.7 mmol/L (ref 3.5–5.1)
Sodium: 140 mmol/L (ref 135–145)
Total Bilirubin: 0.6 mg/dL (ref 0.0–1.2)
Total Protein: 6.4 g/dL — ABNORMAL LOW (ref 6.5–8.1)

## 2024-07-01 MED ORDER — HYDROCODONE-ACETAMINOPHEN 10-325 MG PO TABS
1.0000 | ORAL_TABLET | Freq: Four times a day (QID) | ORAL | Status: DC | PRN
  Administered 2024-07-02 – 2024-07-08 (×8): 1 via ORAL
  Filled 2024-07-01 (×9): qty 1

## 2024-07-01 MED ORDER — POLYETHYLENE GLYCOL 3350 17 G PO PACK: 17.0000 g | PACK | Freq: Every day | ORAL | Status: DC | PRN

## 2024-07-01 MED ORDER — ALLOPURINOL 100 MG PO TABS
100.0000 mg | ORAL_TABLET | Freq: Every day | ORAL | Status: DC
  Administered 2024-07-01 – 2024-07-08 (×8): 100 mg via ORAL
  Filled 2024-07-01 (×8): qty 1

## 2024-07-01 MED ORDER — TIZANIDINE HCL 2 MG PO TABS
4.0000 mg | ORAL_TABLET | Freq: Two times a day (BID) | ORAL | Status: DC | PRN
  Administered 2024-07-01 – 2024-07-08 (×8): 4 mg via ORAL
  Filled 2024-07-01 (×8): qty 2

## 2024-07-01 MED ORDER — PANTOPRAZOLE SODIUM 40 MG PO TBEC
40.0000 mg | DELAYED_RELEASE_TABLET | Freq: Every day | ORAL | Status: DC
  Administered 2024-07-02 – 2024-07-08 (×7): 40 mg via ORAL
  Filled 2024-07-01 (×8): qty 1

## 2024-07-01 MED ORDER — DOCUSATE SODIUM 100 MG PO CAPS
100.0000 mg | ORAL_CAPSULE | Freq: Two times a day (BID) | ORAL | Status: DC
  Administered 2024-07-01 – 2024-07-08 (×11): 100 mg via ORAL
  Filled 2024-07-01 (×12): qty 1

## 2024-07-01 MED ORDER — HYDROMORPHONE HCL 1 MG/ML IJ SOLN
0.5000 mg | INTRAMUSCULAR | Status: DC | PRN
  Administered 2024-07-02: 1 mg via INTRAVENOUS
  Administered 2024-07-04: 0.5 mg via INTRAVENOUS
  Administered 2024-07-04 – 2024-07-06 (×2): 1 mg via INTRAVENOUS
  Filled 2024-07-01 (×4): qty 1

## 2024-07-01 MED ORDER — SALINE SPRAY 0.65 % NA SOLN
1.0000 | NASAL | Status: DC | PRN
  Filled 2024-07-01: qty 44

## 2024-07-01 MED ORDER — METOPROLOL SUCCINATE ER 25 MG PO TB24
25.0000 mg | ORAL_TABLET | Freq: Every day | ORAL | Status: DC
  Administered 2024-07-01 – 2024-07-08 (×8): 25 mg via ORAL
  Filled 2024-07-01 (×8): qty 1

## 2024-07-01 MED ORDER — LORATADINE 10 MG PO TABS
10.0000 mg | ORAL_TABLET | Freq: Every day | ORAL | Status: DC
  Administered 2024-07-01 – 2024-07-08 (×8): 10 mg via ORAL
  Filled 2024-07-01 (×8): qty 1

## 2024-07-01 MED ORDER — ADULT MULTIVITAMIN W/MINERALS CH
1.0000 | ORAL_TABLET | Freq: Every day | ORAL | Status: DC
  Administered 2024-07-01 – 2024-07-08 (×8): 1 via ORAL
  Filled 2024-07-01 (×8): qty 1

## 2024-07-01 MED ORDER — VITAMIN D 25 MCG (1000 UNIT) PO TABS
2000.0000 [IU] | ORAL_TABLET | Freq: Every day | ORAL | Status: DC
  Administered 2024-07-01 – 2024-07-08 (×8): 2000 [IU] via ORAL
  Filled 2024-07-01 (×8): qty 2

## 2024-07-01 MED ORDER — FOLIC ACID 1 MG PO TABS
500.0000 ug | ORAL_TABLET | Freq: Every day | ORAL | Status: DC
  Administered 2024-07-01 – 2024-07-08 (×8): 0.5 mg via ORAL
  Filled 2024-07-01 (×8): qty 1

## 2024-07-01 MED ORDER — GUAIFENESIN ER 600 MG PO TB12
600.0000 mg | ORAL_TABLET | Freq: Two times a day (BID) | ORAL | Status: DC | PRN
  Administered 2024-07-03 – 2024-07-08 (×9): 600 mg via ORAL
  Filled 2024-07-01 (×10): qty 1

## 2024-07-01 MED ORDER — ASPIRIN 81 MG PO TBEC
81.0000 mg | DELAYED_RELEASE_TABLET | Freq: Every day | ORAL | Status: DC
  Administered 2024-07-01 – 2024-07-08 (×8): 81 mg via ORAL
  Filled 2024-07-01 (×8): qty 1

## 2024-07-01 NOTE — Progress Notes (Signed)
 "  Progress Note     Subjective: Patient reports no pain. Tolerated FLD. Had some nausea yesterday. No nausea currently. Denies vomiting. Having stool output from ostomy and flatus.  ROS  All negative with the exception of above.  Objective: Vital signs in last 24 hours: Temp:  [97.7 F (36.5 C)-98.5 F (36.9 C)] 97.7 F (36.5 C) (01/07 0516) Pulse Rate:  [77-86] 77 (01/07 0516) Resp:  [14-18] 14 (01/07 0516) BP: (108-131)/(60-87) 131/66 (01/07 0516) SpO2:  [93 %-94 %] 94 % (01/07 0516) Weight:  [63.7 kg] 63.7 kg (01/07 0338) Last BM Date : 06/30/24  Intake/Output from previous day: 01/06 0701 - 01/07 0700 In: 1614.3 [P.O.:840; I.V.:774.3] Out: 231 [Urine:1; Stool:230] Intake/Output this shift: No intake/output data recorded.  PE: General: Pleasant female who is laying in bed in NAD. HEENT: Head is normocephalic, atraumatic. NGT no longer in place. Heart: HR normal during encounter.   Lungs: Respiratory effort nonlabored. Abd: Soft, NT. Large ventral hernia noted that reduces with palpation. Ostomy noted of left abdomen. Stoma viable. Minimal stool in collection bag. No rebound tenderness or guarding. Skin: Warm and dry. Psych: A&Ox3 with an appropriate affect.   Lab Results:  Recent Labs    06/29/24 2010 06/29/24 2025 07/01/24 0407  WBC 7.8  --  4.8  HGB 14.2 15.3* 10.9*  HCT 44.5 45.0 34.5*  PLT 418*  --  296   BMET Recent Labs    06/29/24 2010 06/29/24 2025 07/01/24 0407  NA 143 141 140  K 4.7 4.5 3.7  CL 99 102 104  CO2 27  --  27  GLUCOSE 136* 134* 94  BUN 32* 33* 25*  CREATININE 0.93 1.00 0.65  CALCIUM  11.2*  --  8.8*   PT/INR No results for input(s): LABPROT, INR in the last 72 hours. CMP     Component Value Date/Time   NA 140 07/01/2024 0407   NA 143 09/10/2016 0000   K 3.7 07/01/2024 0407   CL 104 07/01/2024 0407   CO2 27 07/01/2024 0407   GLUCOSE 94 07/01/2024 0407   BUN 25 (H) 07/01/2024 0407   BUN 14 09/10/2016 0000    CREATININE 0.65 07/01/2024 0407   CREATININE 0.76 11/19/2017 1024   CALCIUM  8.8 (L) 07/01/2024 0407   PROT 6.4 (L) 07/01/2024 0407   ALBUMIN 3.7 07/01/2024 0407   AST 38 07/01/2024 0407   ALT 36 07/01/2024 0407   ALKPHOS 105 07/01/2024 0407   BILITOT 0.6 07/01/2024 0407   GFRNONAA >60 07/01/2024 0407   GFRNONAA 85 11/19/2017 1024   GFRAA 98 11/19/2017 1024   Lipase     Component Value Date/Time   LIPASE 25 06/29/2024 2010       Studies/Results: DG Abd Portable 1 View Result Date: 06/30/2024 EXAM: 1 VIEW XRAY OF THE ABDOMEN 06/30/2024 01:33:42 AM COMPARISON: 04/17/2023, CT today. CLINICAL HISTORY: NG tube placement verification. FINDINGS: LINES, TUBES AND DEVICES: NG tube tip is in the mid stomach. SOFT TISSUES: No abnormal calcifications. BONES: No acute fracture. IMPRESSION: 1. NG tube tip is in the mid stomach. Electronically signed by: Franky Crease MD 06/30/2024 01:39 AM EST RP Workstation: [id number]   CT ABDOMEN PELVIS W CONTRAST Result Date: 06/29/2024 EXAM: CT ABDOMEN AND PELVIS WITH CONTRAST 06/29/2024 09:34:41 PM TECHNIQUE: CT of the abdomen and pelvis was performed with the administration of 70 mL of iohexol  (OMNIPAQUE ) 350 MG/ML injection. Multiplanar reformatted images are provided for review. Automated exposure control, iterative reconstruction, and/or weight-based adjustment of the mA/kV was  utilized to reduce the radiation dose to as low as reasonably achievable. COMPARISON: CT of the pelvis without contrast 06/16/2024 and CT abdomen without and with contrast 11/26/2022. CLINICAL HISTORY: Abdominal pain, acute, nonlocalized. Severe nausea and vomiting and suspected bowel obstruction. 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 FINDINGS: LOWER CHEST: There is mild cardiomegaly. No pericardial effusion. Left main and 2-vessel coronary calcifications again noted in the LAD and circumflex arteries, greatest in the LAD. Similar to the CTA chest  of 05/01/2014, there is homogeneous soft tissue in the prevascular mediastinal space measuring 3.1 x 1.6 cm on series 2, axial image 3. 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 There is a 4 mm chronic right middle lobe nodule laterally on series 4 image 42, benign given length of stability. Lung bases are otherwise clear. LIVER: The liver again demonstrates benign cysts in the left lobe, measuring 4.5 cm in segment 4b, 1.4 cm in segment 3. No liver mass is seen. GALLBLADDER AND BILE DUCTS: Gallbladder is unremarkable. No biliary ductal dilatation. SPLEEN: No acute abnormality. PANCREAS: There is mild chronic prominence of the proximal pancreatic duct, with otherwise unremarkable pancreas. ADRENAL GLANDS: No acute abnormality. KIDNEYS, URETERS AND BLADDER: Scarring changes are noted in both kidneys. There are post-treatment changes in the posterior medial interpolar left renal cortex. No recurrent mass is seen. No urinary stone or obstruction. Bilateral bladder diverticula appear similar. No bladder wall thickening is evident. GI AND BOWEL: There is mild fluid filling in the stomach. There is dilatation in the proximal to mid small bowel up to 4.3 cm. There is a short transitional segment on series 2 axial image 45, just anterior to where the left common iliac artery bifurcates in the central abdomen just left of the midline. Etiology is likely adhesive disease. A large ventral hernia at and below the umbilical level continues to be seen, today containing both dilated and decompressed segments and a portion of the transverse colon. I could not strictly exclude a closed-loop obstruction within the hernia. There is no bowel pneumatosis. Small bowel in the right mid and lower abdomen and right hemipelvis is completely collapsed. There is mild stool retention in the ascending colon. There is diffuse diverticulosis in the transverse and left colon without evidence of  diverticulitis. PERITONEUM AND RETROPERITONEUM: No ascites. No free air. No free hemorrhage, localizing collections or portal venous gas. VASCULATURE: Aorta is normal in caliber. There is extensive aortoiliac calcific plaque. Branch vessel atherosclerosis is present but less heavy. LYMPH NODES: No lymphadenopathy. REPRODUCTIVE ORGANS: No acute abnormality. BONES AND SOFT TISSUES: Severe thoracolumbar levoscoliosis, apex L1 with advanced degenerative changes, multilevel vertebral body ankylosis. Bone graft harvest defects chronically noted in the posterior left ilium. 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 Osteopenia. Again noted is the recent intraarticular nondisplaced fracture of the posterior column of the right acetabulum and a chronic healed fracture deformity of the right inferior pubic ramus. No focal soft tissue abnormality. IMPRESSION: 1. Dilated proximal to mid small bowel up to 4.3 cm with a short transitional segment in the central abdomen, likely due to adhesive disease, without bowel pneumatosis. 2. Large ventral hernia at and below the umbilical level containing both dilated and decompressed bowel and a portion of the transverse colon, with closed-loop obstruction within the hernia sac not strictly excluded. 3. Post-treatment changes in the left kidney without recurrent mass, and no evidence of  metastatic disease. 4. Recent nondisplaced fracture of the posterior column of the right acetabulum. 5. Osteopenia, severe scoliosis, and degenerative changes as above . SABRA 6. Coronary artery and aortic  atherosclerosis. Electronically signed by: Francis Quam MD 06/29/2024 10:20 PM EST RP Workstation: [id number]    Anti-infectives: Anti-infectives (From admission, onward)    None        Assessment/Plan 69 yo female with bowel obstruction -CT from 1/5 show a loop of intestine moving around the left colon as it goes to the ostomy  site. Not thought to be a closed loop obstruction.  -Xray reviewed from 1/6. -Afebrile. -WBC 4.8 and HGB 10.9 from 15.3 -Denies pain. Having stool output. Denies n/v. Tolerating FLD. -NGT removed 1/6. -Can advance diet as tolerated. -Stable from general surgery standpoint. Please call for further questions or concerns.    FEN: FLD; Can advance as tolerated. IVF per primary team VTE: SCDs ID: None currently    LOS: 2 days   I reviewed hospitalist notes, specialist notes, nursing notes, last 24 h vitals and pain scores, last 48 h intake and output, last 24 h labs and trends, and last 24 h imaging results.  This care required moderate level of medical decision making.    Marjorie Carlyon Favre, [facility] 07/01/2024, 8:09 AM Please see Amion for pager number during day hours 7:00am-4:30pm  "

## 2024-07-01 NOTE — Progress Notes (Signed)
 " PROGRESS NOTE   [name]  FMW:[id number]    DOB: 10-Jul-1955    DOA: 06/29/2024  PCP: [name] LABOR, MD   I have briefly reviewed patients previous medical records in [facility].   Brief Hospital Course:   69 year old female, recently lived alone, has a caregiver assistance at home, hospitalized 12/22 - 12/26 for closed right acetabular fracture and discharged to SNF, with PMH paroxysmal A-flutter s/p ablation, HTN, CVA, chronic systolic CHF, severe rheumatoid arthritis, gout, bilateral blindness, has right hearing aid, perforated diverticulitis 2014 s/p sigmoid colectomy with ostomy in place, multiple bowel obstructions, right acetabular fracture last month who presented with nausea, vomiting, abdominal pain.   CT A/P on admission showed dilated proximal and mid small bowel with a short transitional segment noted in the central abdomen from presumed underlying adhesions causing bowel obstruction.  Known large ventral hernia at the umbilical level felt to possibly be closed-loop obstruction on CT but after evaluation by surgery, not felt to be consistent with that.   General surgery consulted, managed conservatively with bowel rest, NG tube with improvement, NG tube removed 1/6, slowly advancing diet.   Assessment & Plan:   SBO (small bowel obstruction) (HCC) - History of bowel obstructions and sigmoid colectomy.  Ostomy in place with minimal output prior to admission -CT showing SBO concerning for underlying adhesions -Some concern on CT for closed-loop obstruction involving ventral hernia however not felt to represent this after evaluation by surgery General Surgery was consulted.  Treated conservatively with bowel rest, NG tube, IVF with improvement.  NG tube was removed on 1/6.  Patient has tolerated full liquid diet.  General surgery follow-up appreciated, have advised advancing diet as tolerated and signed off.   Paroxysmal atrial flutter (HCC) - Now that she is tolerating  oral intake, will change IV Lopressor  to p.o. meds. - Not on anticoagulation; history of ablation but A-fib recurred   Closed right acetabular fracture (HCC) - known on CT: Recent nondisplaced fracture of the posterior column of the right acetabulum.  - Hospitalized 12/22 - 12/26 after a fall resulting in right acetabular fracture.  Recommended for 50% WB on right side with ambulation and WBAT for transfers - outpt follow upwith ortho 2-3 weeks post fall - PT and OT evaluation.  Likely will have to return back to SNF at discharge.   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    Body mass index is 21.35 kg/m.   DVT prophylaxis: SCDs Start: 06/29/24 2340     Code Status: Full Code:  Family Communication: None at bedside Disposition:  Status is: Inpatient Remains inpatient appropriate because: Continue to advance diet as tolerated and pending tolerance, and therapies evaluation, hopefully return to SNF, may be tomorrow.     Consultants:   General surgery  Procedures:   NG tube-removed 1/6.  Subjective:  Patient reports that she tolerated fluids last night and this morning without any abdominal pain, nausea or vomiting.  She states that gas from her colostomy was emptied by nursing this morning.  No stools in ostomy yet.  No other complaints reported.  Objective:  Vitals:   06/30/24 1841 06/30/24 2046 07/01/24 0338 07/01/24 0516  BP: 119/60 125/82  131/66  Pulse:  86  77  Resp: 18 14  14   Temp: 98.1 F (36.7 C) 98.5 F (36.9 C)  97.7 F (36.5 C)  TempSrc:  Oral  Oral   SpO2:  93%  94%  Weight:   63.7 kg   Height:        General exam: Middle-age female, looks older than stated age, small built, frail and thinly nourished, chronically ill looking lying comfortably propped up in bed without distress.  Has right sided hearing aid. Respiratory system: Clear to auscultation. Respiratory effort normal. Cardiovascular system: S1 & S2 heard, RRR. No JVD, murmurs, rubs, gallops or clicks. No pedal edema. Gastrointestinal system: Abdomen is nondistended, soft and nontender. No organomegaly or masses felt. Normal bowel sounds heard.  Left-sided colostomy with some gas but no stools. Central nervous system: Alert and oriented. No focal neurological deficits. Extremities: Symmetric 5 x 5 power.  Chronic and end-stage appearing deformities of both hands and fingers, likely related to rheumatoid arthritis. Skin: No rashes, lesions or ulcers Psychiatry: Judgement and insight appear normal. Mood & affect appropriate.     Data Reviewed:   I have personally reviewed following labs and imaging studies   CBC: Recent Labs  Lab 06/29/24 2010 06/29/24 2025 07/01/24 0407  WBC 7.8  --  4.8  NEUTROABS 6.4  --  2.6  HGB 14.2 15.3* 10.9*  HCT 44.5 45.0 34.5*  MCV 96.9  --  98.6  PLT 418*  --  296    Basic Metabolic Panel: Recent Labs  Lab 06/29/24 2010 06/29/24 2025 06/30/24 0426 07/01/24 0407  NA 143 141  --  140  K 4.7 4.5  --  3.7  CL 99 102  --  104  CO2 27  --   --  27  GLUCOSE 136* 134*  --  94  BUN 32* 33*  --  25*  CREATININE 0.93 1.00  --  0.65  CALCIUM  11.2*  --   --  8.8*  MG  --   --  1.7 1.8    Liver Function Tests: Recent Labs  Lab 06/29/24 2010 07/01/24 0407  AST 47* 38  ALT 38 36  ALKPHOS 151* 105  BILITOT 0.8 0.6  PROT 8.3* 6.4*  ALBUMIN 4.5 3.7    CBG: No results for input(s): GLUCAP in the last 168 hours.  Microbiology Studies:  No results found for this or any previous visit (from the past 240 hours).  Radiology  Studies:  DG Abd Portable 1 View Result Date: 06/30/2024 EXAM: 1 VIEW XRAY OF THE ABDOMEN 06/30/2024 01:33:42 AM COMPARISON: 04/17/2023, CT today. CLINICAL HISTORY: NG tube placement verification. FINDINGS: LINES, TUBES AND DEVICES: NG tube tip is in the mid stomach. SOFT TISSUES: No abnormal calcifications. BONES: No acute fracture. IMPRESSION: 1. NG tube tip is in the mid stomach. Electronically signed by: Franky Crease MD 06/30/2024 01:39 AM EST RP Workstation: [id number]   CT ABDOMEN PELVIS W CONTRAST Result Date: 06/29/2024 EXAM: CT ABDOMEN AND PELVIS WITH CONTRAST 06/29/2024 09:34:41 PM TECHNIQUE: CT of the abdomen and pelvis was performed with the administration of 70 mL of iohexol  (OMNIPAQUE ) 350 MG/ML injection. Multiplanar reformatted images are provided for review. Automated exposure control, iterative reconstruction, and/or weight-based adjustment of the mA/kV was utilized to reduce the radiation dose to as low as reasonably achievable. COMPARISON: CT of the pelvis without contrast 06/16/2024 and CT abdomen without  and with contrast 11/26/2022. CLINICAL HISTORY: Abdominal pain, acute, nonlocalized. Severe nausea and vomiting and suspected bowel obstruction. 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 FINDINGS: LOWER CHEST: There is mild cardiomegaly. No pericardial effusion. Left main and 2-vessel coronary calcifications again noted in the LAD and circumflex arteries, greatest in the LAD. Similar to the CTA chest of 05/01/2014, there is homogeneous soft tissue in the prevascular mediastinal space measuring 3.1 x 1.6 cm on series 2, axial image 3. 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 There is a 4 mm chronic right middle lobe nodule laterally on series 4 image 42, benign given length of stability. Lung bases are otherwise clear. LIVER: The liver again demonstrates benign cysts in the left lobe, measuring 4.5 cm in segment  4b, 1.4 cm in segment 3. No liver mass is seen. GALLBLADDER AND BILE DUCTS: Gallbladder is unremarkable. No biliary ductal dilatation. SPLEEN: No acute abnormality. PANCREAS: There is mild chronic prominence of the proximal pancreatic duct, with otherwise unremarkable pancreas. ADRENAL GLANDS: No acute abnormality. KIDNEYS, URETERS AND BLADDER: Scarring changes are noted in both kidneys. There are post-treatment changes in the posterior medial interpolar left renal cortex. No recurrent mass is seen. No urinary stone or obstruction. Bilateral bladder diverticula appear similar. No bladder wall thickening is evident. GI AND BOWEL: There is mild fluid filling in the stomach. There is dilatation in the proximal to mid small bowel up to 4.3 cm. There is a short transitional segment on series 2 axial image 45, just anterior to where the left common iliac artery bifurcates in the central abdomen just left of the midline. Etiology is likely adhesive disease. A large ventral hernia at and below the umbilical level continues to be seen, today containing both dilated and decompressed segments and a portion of the transverse colon. I could not strictly exclude a closed-loop obstruction within the hernia. There is no bowel pneumatosis. Small bowel in the right mid and lower abdomen and right hemipelvis is completely collapsed. There is mild stool retention in the ascending colon. There is diffuse diverticulosis in the transverse and left colon without evidence of diverticulitis. PERITONEUM AND RETROPERITONEUM: No ascites. No free air. No free hemorrhage, localizing collections or portal venous gas. VASCULATURE: Aorta is normal in caliber. There is extensive aortoiliac calcific plaque. Branch vessel atherosclerosis is present but less heavy. LYMPH NODES: No lymphadenopathy. REPRODUCTIVE ORGANS: No acute abnormality. BONES AND SOFT TISSUES: Severe thoracolumbar levoscoliosis, apex L1 with advanced degenerative changes, multilevel  vertebral body ankylosis. Bone graft harvest defects chronically noted in the posterior left ilium. 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 Osteopenia. Again noted is the recent intraarticular nondisplaced fracture of the posterior column of the right acetabulum and a chronic healed fracture deformity of the right inferior pubic ramus. No focal soft tissue abnormality. IMPRESSION: 1. Dilated proximal to mid small bowel up to 4.3 cm with a short transitional segment in the central abdomen, likely due to adhesive disease, without bowel pneumatosis. 2. Large ventral hernia at and below the umbilical level containing both dilated and decompressed bowel and a portion of the transverse colon, with closed-loop obstruction within the hernia sac not strictly excluded. 3. Post-treatment changes in the left kidney without recurrent mass, and no evidence of metastatic disease. 4. Recent nondisplaced fracture of the posterior column of the right acetabulum. 5. Osteopenia, severe scoliosis, and degenerative changes as above . SABRA  6. Coronary artery and aortic  atherosclerosis. Electronically signed by: Francis Quam MD 06/29/2024 10:20 PM EST RP Workstation: [id number]    Scheduled Meds:    metoprolol  tartrate  2.5 mg Intravenous Q8H   pantoprazole  (PROTONIX ) IV  40 mg Intravenous Q24H   sodium chloride  flush  3 mL Intravenous Q12H    Continuous Infusions:    sodium chloride  75 mL/hr at 06/30/24 1416     LOS: 2 days     Trenda Mar, MD,  FACP, [facility], [facility], [facility]   [facility] [place]   To contact the attending provider between 7A-7P or the covering provider during after hours 7P-7A, please log into the web site [email or web address] and access using universal [place] password for that web site. If you do not have the password, please call the hospital operator.  07/01/2024, 9:50 AM   "

## 2024-07-01 NOTE — Evaluation (Signed)
 Physical Therapy Evaluation Patient Details Name: [name] MRN: [id number] DOB: 02/02/1956 Today's Date: 07/01/2024  History of Present Illness  Pt is 68 yo presenting to [facility] On 1/5 due to nausea/vomiting and abdominal pain from rehab. Recent hospitalization 12/22 with non-operative acetabular fracutre.  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 Pt was unable to get to standing due to pt has special shoes she needs to stand due to bil foot deformity and does not currently have those available. Pt was able to WB ~ 25% through feet to assist with scooting up EOB. Due to pt current functional status, home set up and available assistance at home recommending skilled physical therapy services < 3 hours/day in order to address strength, balance and functional mobility to decrease risk for falls, injury, immobility, skin break down and re-hospitalization.          If plan is discharge home, recommend the following: Assist for transportation;Help with stairs or ramp for entrance;A little help with walking and/or transfers;A little help with bathing/dressing/bathroom   Can travel by private vehicle   No    Equipment Recommendations None recommended by PT     Functional Status Assessment Patient has had a recent decline in their functional status and demonstrates the ability to make significant improvements in function in a reasonable and predictable amount of time.     Precautions / Restrictions Precautions Precautions: Fall Recall of Precautions/Restrictions: Impaired Precaution/Restrictions Comments: has special shoes, blind Restrictions Weight Bearing Restrictions Per Provider Order: Yes RLE Weight Bearing Per Provider Order: Partial weight bearing RLE Partial Weight Bearing Percentage or  Pounds: WBAT for transfers and partial 50% WB for ambulation      Mobility  Bed Mobility Overal bed mobility: Needs Assistance Bed Mobility: Supine to Sit, Sit to Supine     Supine to sit: Min assist Sit to supine: Min assist   General bed mobility comments: Min assist with legs and trunk to sit upright.able to scoot to bed edge and push with UE's to scoot back onto bed and Mod A to the R    Transfers     General transfer comment: unable to get to standing due to pt does not have her special shoes with her at the hospital       Balance Overall balance assessment: Needs assistance Sitting-balance support: Feet supported, Bilateral upper extremity supported Sitting balance-Leahy Scale: Fair       Pertinent Vitals/Pain Pain Assessment Faces Pain Scale: Hurts little more Pain Location: right hip Pain Descriptors / Indicators: Aching, Discomfort Pain Intervention(s): Monitored during session, Limited activity within patient's tolerance, Repositioned    Home Living Family/patient expects to be discharged to:: Skilled nursing facility Living Arrangements: Alone Available Help at Discharge: Family;Available PRN/intermittently;Personal care attendant Type of Home: Apartment Home Access: Level entry       Home Layout: One level Home Equipment: BSC/3in1;Wheelchair - manual;Tub bench;Lift chair;Rolling Walker (2 wheels)      Prior Function Prior Level of Function : Needs assist             Mobility Comments: previously used RW and manual W/C for mobility ADLs Comments: daughter or aide performed cooking/cleaning. Pt used BSC at night prior to first hospitalization in December.     Extremity/Trunk Assessment   Upper Extremity Assessment  Upper Extremity Assessment: Generalized weakness    Lower Extremity Assessment Lower Extremity Assessment: Generalized weakness RLE Deficits / Details: noted foot deformities, special shoes which are not available LLE Deficits  / Details: foot deformities noted., Grossly WFL ROM and  strength    Cervical / Trunk Assessment Cervical / Trunk Assessment: Kyphotic  Communication   Communication Communication: No apparent difficulties    Cognition Arousal: Alert Behavior During Therapy: WFL for tasks assessed/performed   PT - Cognitive impairments: No apparent impairments       Following commands: Intact       Cueing Cueing Techniques: Verbal cues, Tactile cues     General Comments General comments (skin integrity, edema, etc.): pt was assisted with washing her face due to copious amount of exudate from the R eye        Assessment/Plan    PT Assessment Patient needs continued PT services  PT Problem List Decreased strength;Decreased knowledge of use of DME;Decreased range of motion;Decreased safety awareness;Decreased activity tolerance;Decreased knowledge of precautions;Decreased balance;Decreased mobility;Pain       PT Treatment Interventions DME instruction;Gait training;Functional mobility training;Therapeutic activities;Therapeutic exercise;Patient/family education    PT Goals (Current goals can be found in the Care Plan section)  Acute Rehab PT Goals Patient Stated Goal: return to rehab to get stronger and go home. PT Goal Formulation: With patient Time For Goal Achievement: 07/15/24 Potential to Achieve Goals: Good    Frequency Min 2X/week        AM-PAC PT 6 Clicks Mobility  Outcome Measure Help needed turning from your back to your side while in a flat bed without using bedrails?: A Little Help needed moving from lying on your back to sitting on the side of a flat bed without using bedrails?: A Little Help needed moving to and from a bed to a chair (including a wheelchair)?: A Lot Help needed standing up from a chair using your arms (e.g., wheelchair or bedside chair)?: A Lot Help needed to walk in hospital room?: Total Help needed climbing 3-5 steps with a railing? : Total 6  Click Score: 12    End of Session Equipment Utilized During Treatment: Gait belt Activity Tolerance: Patient tolerated treatment well Patient left: in bed;with call bell/phone within reach;with bed alarm set Nurse Communication: Mobility status PT Visit Diagnosis: Unsteadiness on feet (R26.81);Muscle weakness (generalized) (M62.81);Difficulty in walking, not elsewhere classified (R26.2);Pain Pain - Right/Left: Right Pain - part of body: Hip    Time: 8944-8884 PT Time Calculation (min) (ACUTE ONLY): 20 min   Charges:   PT Evaluation $PT Eval Low Complexity: 1 Low   PT General Charges $$ ACUTE PT VISIT: 1 Visit         Dorothyann Maier, DPT, CLT  Acute Rehabilitation Services Office: [phone] (Secure chat preferred)   Dorothyann VEAR Maier 07/01/2024, 12:37 PM

## 2024-07-02 ENCOUNTER — Inpatient Hospital Stay (HOSPITAL_COMMUNITY)

## 2024-07-02 DIAGNOSIS — K56609 Unspecified intestinal obstruction, unspecified as to partial versus complete obstruction: Secondary | ICD-10-CM | POA: Diagnosis not present

## 2024-07-02 MED ORDER — DIATRIZOATE MEGLUMINE & SODIUM 66-10 % PO SOLN
90.0000 mL | Freq: Once | ORAL | Status: AC
  Administered 2024-07-02: 90 mL via ORAL
  Filled 2024-07-02: qty 90

## 2024-07-02 MED ORDER — METHOTREXATE 25 MG/ML ~~LOC~~ SOSY
1.2500 mg | PREFILLED_SYRINGE | Freq: Once | SUBCUTANEOUS | Status: AC
  Administered 2024-07-02: 1.25 mg via SUBCUTANEOUS
  Filled 2024-07-02: qty 1

## 2024-07-02 MED ORDER — SODIUM CHLORIDE 0.9 % IV SOLN: INTRAVENOUS | Status: DC

## 2024-07-02 MED ORDER — ABATACEPT 125 MG/ML ~~LOC~~ SOSY
125.0000 mg | PREFILLED_SYRINGE | Freq: Once | SUBCUTANEOUS | Status: AC
  Administered 2024-07-02: 125 mg via SUBCUTANEOUS
  Filled 2024-07-02 (×2): qty 1

## 2024-07-02 NOTE — Progress Notes (Signed)
 " PROGRESS NOTE   [name]  FMW:[id number]    DOB: 1956-06-05    DOA: 06/29/2024  PCP: [name] LABOR, MD   I have briefly reviewed patients previous medical records in [facility].   Brief Hospital Course:   69 year old female, recently lived alone, has a caregiver assistance at home, hospitalized 12/22 - 12/26 for closed right acetabular fracture and discharged to SNF, with PMH paroxysmal A-flutter s/p ablation, HTN, CVA, chronic systolic CHF, severe rheumatoid arthritis, gout, bilateral blindness, has right hearing aid, perforated diverticulitis 2014 s/p sigmoid colectomy with ostomy in place, multiple bowel obstructions, right acetabular fracture last month who presented with nausea, vomiting, abdominal pain.   CT A/P on admission showed dilated proximal and mid small bowel with a short transitional segment noted in the central abdomen from presumed underlying adhesions causing bowel obstruction.  Known large ventral hernia at the umbilical level felt to possibly be closed-loop obstruction on CT but after evaluation by surgery, not felt to be consistent with that.   General surgery consulted, managed conservatively with bowel rest, NG tube with improvement, NG tube removed 1/6, slowly advancing diet.   Assessment & Plan:   SBO (small bowel obstruction) (HCC) - History of bowel obstructions and sigmoid colectomy.  Ostomy in place with minimal output prior to admission -CT showing SBO concerning for underlying adhesions -Some concern on CT for closed-loop obstruction involving ventral hernia however not felt to represent this after evaluation by surgery General Surgery was consulted.  Treated conservatively with bowel rest, NG tube, IVF with improvement.  NG tube was removed on 1/6.  Diet was advanced to soft diet General Surgery signed off 1/7. 1/8: Patient reported abdominal fullness, no significant stool output through colostomy but no nausea or vomiting.  KUB shows Nonspecific  bowel gas pattern with diffuse gas-filled small bowel loops and colon. Component of underlying ileus not excluded.  Reengaged [facility] and they are starting small bowel protocol with oral contrast.  Not medically ready for DC today.   Paroxysmal atrial flutter (HCC) - Continue p.o. metoprolol . - Not on anticoagulation; history of ablation but A-fib recurred   Closed right acetabular fracture (HCC) - known on CT: Recent nondisplaced fracture of the posterior column of the right acetabulum.  - Hospitalized 12/22 - 12/26 after a fall resulting in right acetabular fracture.  Recommended for 50% WB on right side with ambulation and WBAT for transfers - outpt follow upwith ortho 2-3 weeks post fall - PT and OT evaluation.  PT recommends SNF.  TOC on board.   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  Anemia of chronic disease Stable compared to recent.    Body mass index is 22.02 kg/m.   DVT prophylaxis: SCDs Start: 06/29/24 2340     Code Status: Full Code:  Family Communication: None at bedside Disposition:  Inpatient appropriate.  Despite improvement in SBO yesterday, has recurrent symptoms today as noted above and surgery planning small bowel prep with oral contrast.  Not medically ready for DC today.     Consultants:   General surgery  Procedures:   NG tube-removed 1/6.  Subjective:  Seen this morning.  Abdomen feels distended.  Tolerated some soft diet-50% p.o. intake documented x 2 yesterday.  Some abdominal discomfort.  States gas through the colostomy but not much or any stool in the last 48 hours.  No nausea or  vomiting.  Objective:   Vitals:   07/01/24 2146 07/02/24 0500 07/02/24 0623 07/02/24 1000  BP: (!) 146/86  (!) 143/78 120/74  Pulse: 70  66 63  Resp: 17  17 18   Temp: 98.7 F (37.1 C)  97.9 F (36.6 C)   TempSrc:   Oral   SpO2: 96%  95%   Weight:  65.7 kg    Height:        General exam: Middle-age female, looks older than stated age, small built, frail and thinly nourished, chronically ill looking lying comfortably propped up in bed without distress.  Has right sided hearing aid. Respiratory system: Clear to auscultation. Respiratory effort normal. Cardiovascular system: S1 & S2 heard, RRR. No JVD, murmurs, rubs, gallops or clicks. No pedal edema. Gastrointestinal system: Abdomen is nondistended, soft and nontender. No organomegaly or masses felt. Normal bowel sounds heard.  Left-sided colostomy with some gas but no stools.  Large ventral hernia but uncomplicated and easily reducible. Central nervous system: Alert and oriented. No focal neurological deficits. Extremities: Symmetric 5 x 5 power.  Chronic and end-stage appearing deformities of both hands and fingers, likely related to rheumatoid arthritis.  Right corneal opacity.  Blind in both eyes. Skin: No rashes, lesions or ulcers Psychiatry: Judgement and insight appear normal. Mood & affect appropriate.     Data Reviewed:   I have personally reviewed following labs and imaging studies   CBC: Recent Labs  Lab 06/29/24 2010 06/29/24 2025 07/01/24 0407  WBC 7.8  --  4.8  NEUTROABS 6.4  --  2.6  HGB 14.2 15.3* 10.9*  HCT 44.5 45.0 34.5*  MCV 96.9  --  98.6  PLT 418*  --  296    Basic Metabolic Panel: Recent Labs  Lab 06/29/24 2010 06/29/24 2025 06/30/24 0426 07/01/24 0407  NA 143 141  --  140  K 4.7 4.5  --  3.7  CL 99 102  --  104  CO2 27  --   --  27  GLUCOSE 136* 134*  --  94  BUN 32* 33*  --  25*  CREATININE 0.93 1.00  --  0.65  CALCIUM  11.2*  --   --  8.8*  MG  --   --  1.7 1.8    Liver Function  Tests: Recent Labs  Lab 06/29/24 2010 07/01/24 0407  AST 47* 38  ALT 38 36  ALKPHOS 151* 105  BILITOT 0.8 0.6  PROT 8.3* 6.4*  ALBUMIN 4.5 3.7    CBG: No results for input(s): GLUCAP in the last 168 hours.  Microbiology Studies:  No results found for this or any previous visit (from the past 240 hours).  Radiology Studies:  DG Abd Portable 1V Result Date: 07/02/2024 CLINICAL DATA:  Small bowel obstruction EXAM: PORTABLE ABDOMEN - 1 VIEW COMPARISON:  06/30/2024 FINDINGS: NG tube has been removed in the interval. Diffuse gas-filled small bowel loops and colon are seen in the abdomen and pelvis in a nonspecific pattern. Bowel gas pattern does not appear frankly obstructive although component of underlying ileus not excluded. Marked convex leftward scoliosis of the upper lumbar spine again noted. Degenerative changes evident in both hips, right greater than left. IMPRESSION: Nonspecific bowel gas pattern with diffuse gas-filled small bowel loops and colon. Component of  underlying ileus not excluded. Electronically Signed   By: Camellia Candle M.D.   On: 07/02/2024 10:03    Scheduled Meds:    allopurinol   100 mg Oral Daily   aspirin  EC  81 mg Oral Daily   cholecalciferol   2,000 Units Oral Daily   diatrizoate  meglumine -sodium  90 mL Oral Once   docusate sodium   100 mg Oral BID   folic acid   500 mcg Oral Daily   loratadine   10 mg Oral Daily   metoprolol  succinate  25 mg Oral Daily   multivitamin with minerals  1 tablet Oral Daily   pantoprazole   40 mg Oral Daily   sodium chloride  flush  3 mL Intravenous Q12H    Continuous Infusions:    sodium chloride  75 mL/hr at 06/30/24 1416     LOS: 3 days     Trenda Mar, MD,  FACP, [facility], [facility], [facility]   [facility] [place]   To contact the attending provider between 7A-7P or the covering provider during after hours 7P-7A, please log into the web site [email or web address] and access using universal  [place] password for that web site. If you do not have the password, please call the hospital operator.  07/02/2024, 12:36 PM   "

## 2024-07-02 NOTE — Progress Notes (Signed)
 "  Progress Note     Subjective: Hospitalist asked gen surgery to reengage due to concern of less stool output within the last 24 hrs.   Feels distended. Denies pain. Denies n/v. Tolerating regular diet. Reports that she had a small/minimal amount of stool in her collection bag yesterday. She reports that since then, there has mostly been air.   ROS  All negative with the exception of above.   Objective: Vital signs in last 24 hours: Temp:  [97.5 F (36.4 C)-98.7 F (37.1 C)] 97.9 F (36.6 C) (01/08 0623) Pulse Rate:  [60-72] 63 (01/08 1000) Resp:  [17-18] 18 (01/08 1000) BP: (120-146)/(74-86) 120/74 (01/08 1000) SpO2:  [95 %-98 %] 95 % (01/08 0623) Weight:  [65.7 kg] 65.7 kg (01/08 0500) Last BM Date : 06/30/24  Intake/Output from previous day: 01/07 0701 - 01/08 0700 In: 601 [P.O.:598; I.V.:3] Out: -  Intake/Output this shift: No intake/output data recorded.  PE: General: Pleasant female who is laying in bed in NAD. HEENT: Head is normocephalic, atraumatic. NGT no longer in place. Heart: HR normal during encounter.   Lungs: Respiratory effort nonlabored. Abd: Soft, NT. Large ventral hernia noted. Abdominal distention present. Ostomy noted of left abdomen. Stoma viable. No stool. In collection bag. Only air present. No rebound tenderness or guarding. Skin: Warm and dry. Psych: A&Ox3 with an appropriate affect.  Lab Results:  Recent Labs    06/29/24 2010 06/29/24 2025 07/01/24 0407  WBC 7.8  --  4.8  HGB 14.2 15.3* 10.9*  HCT 44.5 45.0 34.5*  PLT 418*  --  296   BMET Recent Labs    06/29/24 2010 06/29/24 2025 07/01/24 0407  NA 143 141 140  K 4.7 4.5 3.7  CL 99 102 104  CO2 27  --  27  GLUCOSE 136* 134* 94  BUN 32* 33* 25*  CREATININE 0.93 1.00 0.65  CALCIUM  11.2*  --  8.8*   PT/INR No results for input(s): LABPROT, INR in the last 72 hours. CMP     Component Value Date/Time   NA 140 07/01/2024 0407   NA 143 09/10/2016 0000   K 3.7  07/01/2024 0407   CL 104 07/01/2024 0407   CO2 27 07/01/2024 0407   GLUCOSE 94 07/01/2024 0407   BUN 25 (H) 07/01/2024 0407   BUN 14 09/10/2016 0000   CREATININE 0.65 07/01/2024 0407   CREATININE 0.76 11/19/2017 1024   CALCIUM  8.8 (L) 07/01/2024 0407   PROT 6.4 (L) 07/01/2024 0407   ALBUMIN 3.7 07/01/2024 0407   AST 38 07/01/2024 0407   ALT 36 07/01/2024 0407   ALKPHOS 105 07/01/2024 0407   BILITOT 0.6 07/01/2024 0407   GFRNONAA >60 07/01/2024 0407   GFRNONAA 85 11/19/2017 1024   GFRAA 98 11/19/2017 1024   Lipase     Component Value Date/Time   LIPASE 25 06/29/2024 2010       Studies/Results: DG Abd Portable 1V Result Date: 07/02/2024 CLINICAL DATA:  Small bowel obstruction EXAM: PORTABLE ABDOMEN - 1 VIEW COMPARISON:  06/30/2024 FINDINGS: NG tube has been removed in the interval. Diffuse gas-filled small bowel loops and colon are seen in the abdomen and pelvis in a nonspecific pattern. Bowel gas pattern does not appear frankly obstructive although component of underlying ileus not excluded. Marked convex leftward scoliosis of the upper lumbar spine again noted. Degenerative changes evident in both hips, right greater than left. IMPRESSION: Nonspecific bowel gas pattern with diffuse gas-filled small bowel loops and colon. Component of  underlying ileus not excluded. Electronically Signed   By: Camellia Candle M.D.   On: 07/02/2024 10:03    Anti-infectives: Anti-infectives (From admission, onward)    None        Assessment/Plan 69 yo female with bowel obstruction -CT from 1/5 show a loop of intestine moving around the left colon as it goes to the ostomy site. Not thought to be a closed loop obstruction.  -Xray reviewed from this AM showing nonspecific bowel gas pattern with diffuse gas-filled small bowel loops and colon. No frank obstruction noted.  -Afebrile. -Labs from 1/7: WBC 4.8 and HGB 10.9;  -Denies pain. Denies n/v. Tolerating soft diet. Patient with concerns of  decreased stool output.  -NGT removed 1/6. -Will initiate small bowel protocol with oral contrast. -Will continue to follow.  FEN: Soft; IVF per primary team VTE: SCDs ID: None currently    LOS: 3 days   I reviewed specialist notes, nursing notes, hospitalist notes, last 24 h vitals and pain scores, last 48 h intake and output, last 24 h labs and trends, and last 24 h imaging results.  This care required moderate level of medical decision making.    Marjorie Carlyon Favre, [facility] 07/02/2024, 10:53 AM Please see Amion for pager number during day hours 7:00am-4:30pm  "

## 2024-07-02 NOTE — Plan of Care (Signed)
  Problem: Elimination: Goal: Will not experience complications related to bowel motility Outcome: Progressing   Problem: Pain Managment: Goal: General experience of comfort will improve and/or be controlled Outcome: Progressing   Problem: Skin Integrity: Goal: Risk for impaired skin integrity will decrease Outcome: Progressing

## 2024-07-02 NOTE — TOC Progression Note (Signed)
 Transition of Care [facility]) - Progression Note    Patient Details  Name: [name] MRN: [id number] Date of Birth: [birth year]  [facility]) CM/SW Contact  Erienne Spelman LITTIE Moose, [place] Phone Number: 07/02/2024, 2:31 PM  Clinical Narrative:    CSW initiated insurance auth process for [place], [place] #[id number]. CSW confirmed with facility pt can return pending auth approval. CSW will continue to follow.     Barriers to Discharge: Continued Medical Work up               Expected Discharge Plan and Services       Living arrangements for the past 2 months: Apartment                                       Social Drivers of Health (SDOH) Interventions SDOH Screenings   Food Insecurity: No Food Insecurity (06/30/2024)  Housing: Low Risk (06/30/2024)  Transportation Needs: No Transportation Needs (06/30/2024)  Utilities: Not At Risk (06/30/2024)  Depression (PHQ2-9): Low Risk (10/01/2023)  Financial Resource Strain: Low Risk (10/16/2021)  Physical Activity: Inactive (10/16/2021)  Social Connections: Moderately Isolated (06/30/2024)  Stress: Stress Concern Present (10/16/2021)  Tobacco Use: Medium Risk (06/29/2024)    Readmission Risk Interventions    06/19/2024   12:56 PM  Readmission Risk Prevention Plan  Post Dischage Appt Complete  Medication Screening Complete  Transportation Screening Complete

## 2024-07-02 NOTE — Progress Notes (Signed)
 Notified xray that patient drank gastrografin  at 1413.

## 2024-07-02 NOTE — TOC Progression Note (Signed)
 Transition of Care [facility]) - Progression Note    Patient Details  Name: [name] MRN: [id number] Date of Birth: Dec 09, 1955  Transition [facility] Care [facility]) CM/SW Contact  Marchia Diguglielmo LITTIE Moose, [place] Phone Number: 07/02/2024, 10:07 AM  Clinical Narrative:    CSW spoke with pt regarding SNF following hospital DC. Pt stated she would like to return to [facility] once she is discharged. CSW completed updated Fl2 and sent to facility. CSW contacted Myrene with [facility] to confirm pt could return for SNF following hospital dc and is awaiting response. CSW will continue to follow.     Barriers to Discharge: Continued Medical Work up               Expected Discharge Plan and Services       Living arrangements for the past 2 months: Apartment                                       Social Drivers of Health (SDOH) Interventions SDOH Screenings   Food Insecurity: No Food Insecurity (06/30/2024)  Housing: Low Risk (06/30/2024)  Transportation Needs: No Transportation Needs (06/30/2024)  Utilities: Not At Risk (06/30/2024)  Depression (PHQ2-9): Low Risk (10/01/2023)  Financial Resource Strain: Low Risk (10/16/2021)  Physical Activity: Inactive (10/16/2021)  Social Connections: Moderately Isolated (06/30/2024)  Stress: Stress Concern Present (10/16/2021)  Tobacco Use: Medium Risk (06/29/2024)    Readmission Risk Interventions    06/19/2024   12:56 PM  Readmission Risk Prevention Plan  Post Dischage Appt Complete  Medication Screening Complete  Transportation Screening Complete

## 2024-07-02 NOTE — Care Management Important Message (Signed)
 Important Message  Patient Details  Name: [name] MRN: [id number] Date of Birth: 1956/02/02   Important Message Given:  Yes - Medicare IM     Jennie Laneta Dragon 07/02/2024, 12:16 PM

## 2024-07-02 NOTE — Plan of Care (Signed)

## 2024-07-02 NOTE — NC FL2 (Signed)
 " [place]  MEDICAID FL2 LEVEL OF CARE FORM     IDENTIFICATION  Patient Name: [name] Birthdate: [birth year] Sex: female Admission Date (Current Location): 06/29/2024  [facility] and [place] Number:  Producer, Television/film/video and Address:  [facility], [address] [place], [place] [address]      Provider Number: [id number]  Attending Physician Name and Address:  Judeth Trenda BIRCH, MD  Relative Name and Phone Number:  Hortencia (Daughter)  [phone]    Current Level of Care: Hospital Recommended Level of Care: Skilled Nursing Facility Prior Approval Number:    Date Approved/Denied:   PASRR Number: [id number]  Discharge Plan: SNF    Current Diagnoses: Patient Active Problem List   Diagnosis Date Noted   SBO (small bowel obstruction) (HCC) 06/29/2024   Closed right acetabular fracture (HCC) 06/18/2024   Polyarthralgia 06/18/2024   Seborrhea capitis 03/19/2023   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) 09/26/2022   Colostomy in place [facility]) 09/26/2022   History of colonic diverticulitis 09/26/2022   Perforation of left tympanic membrane 12/11/2021   Vitamin D  deficiency 12/05/2021   Chronic combined systolic and diastolic heart failure (HCC) 01/23/2021   Malnutrition of moderate degree 05/11/2020   Small bowel obstruction due to adhesions (HCC) 05/07/2020   Left renal mass 10/23/2017   Hx of adenomatous polyp of colon 11/13/2016   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   Renal mass    Pulmonary HTN (HCC) 01/01/2014   AKI (acute kidney injury) 01/01/2014   Iron deficiency anemia 01/01/2014   Atrial flutter (HCC) 12/31/2013   Stoma dermatitis 11/30/2013   Incisional hernia 11/30/2013   Constipation, chronic 11/30/2013   Charcot's joint of foot 07/07/2013   Bilateral leg edema 04/20/2013   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 11/28/2012 01/05/2013   Rheumatoid arthritis  (HCC) 06/07/2009   THYROID  NODULE 07/01/2007   LUNG NODULE 07/01/2007   Thyroid  nodule 07/01/2007   Osteoporosis 05/07/2007   ABUSE, ALCOHOL, 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/12/2007   Legally blind 04/12/2007   HEMORRHOIDS, INTERNAL 04/12/2007   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/14/2007   Hyperlipidemia 01/07/2007   Essential hypertension 01/07/2007   GERD 01/07/2007   Marfan's syndrome 01/07/2007   History of CVA (cerebrovascular accident) 07/2006   Chronic gout 2008    Orientation RESPIRATION BLADDER Height & Weight     Self, Time, Situation, Place  Normal Continent Weight: 144 lb 13.5 oz (65.7 kg) Height:  5' 8 (172.7 cm)  BEHAVIORAL SYMPTOMS/MOOD NEUROLOGICAL BOWEL NUTRITION STATUS      Continent Diet (See DC Summary)  AMBULATORY STATUS COMMUNICATION OF NEEDS Skin   Limited Assist Verbally Normal                       Personal Care Assistance Level of Assistance  Bathing, Feeding, Dressing Bathing Assistance: Limited assistance Feeding assistance: Independent Dressing Assistance: Limited assistance     Functional Limitations Info  Sight, Hearing, Speech Sight Info: Impaired Hearing Info: Impaired Speech Info: Adequate    SPECIAL CARE FACTORS FREQUENCY  PT (By licensed PT), OT (By licensed OT)     PT Frequency: 5x/week OT Frequency: 5x/week            Contractures Contractures Info: Not present    Additional Factors Info  Code Status, Allergies Code Status Info: Full Allergies Info: Rosuvastatin ; Spinach           Current Medications (07/02/2024):  This is the current hospital active  medication list Current Facility-Administered Medications  Medication Dose Route Frequency Provider Last Rate Last Admin   0.9 %  sodium chloride  infusion   Intravenous Continuous Patsy Lenis, MD 75 mL/hr at 06/30/24 1416 New Bag at 06/30/24 1416   acetaminophen  (TYLENOL ) tablet 650 mg  650 mg Oral Q6H PRN Opyd, Timothy S, MD       Or   acetaminophen  (TYLENOL ) suppository 650 mg  650 mg  Rectal Q6H PRN Opyd, Timothy S, MD       allopurinol  (ZYLOPRIM ) tablet 100 mg  100 mg Oral Daily Hongalgi, Anand D, MD   100 mg at 07/02/24 1000   aspirin  EC tablet 81 mg  81 mg Oral Daily Hongalgi, Anand D, MD   81 mg at 07/02/24 9040   cholecalciferol  (VITAMIN D3) 25 MCG (1000 UNIT) tablet 2,000 Units  2,000 Units Oral Daily Hongalgi, Anand D, MD   2,000 Units at 07/02/24 9040   docusate sodium  (COLACE) capsule 100 mg  100 mg Oral BID Hongalgi, Anand D, MD   100 mg at 07/02/24 9040   folic acid  (FOLVITE ) tablet 0.5 mg  500 mcg Oral Daily Hongalgi, Anand D, MD   0.5 mg at 07/02/24 9040   guaiFENesin  (MUCINEX ) 12 hr tablet 600 mg  600 mg Oral BID PRN Chavez, Abigail, NP       HYDROcodone -acetaminophen  (NORCO) 10-325 MG per tablet 1 tablet  1 tablet Oral Q6H PRN Hongalgi, Anand D, MD   1 tablet at 07/02/24 9776   HYDROmorphone  (DILAUDID ) injection 0.5-1 mg  0.5-1 mg Intravenous Q3H PRN Hongalgi, Anand D, MD       loratadine  (CLARITIN ) tablet 10 mg  10 mg Oral Daily Hongalgi, Anand D, MD   10 mg at 07/02/24 1000   metoprolol  succinate (TOPROL -XL) 24 hr tablet 25 mg  25 mg Oral Daily Hongalgi, Anand D, MD   25 mg at 07/02/24 1000   multivitamin with minerals tablet 1 tablet  1 tablet Oral Daily Hongalgi, Anand D, MD   1 tablet at 07/02/24 9040   ondansetron  (ZOFRAN ) tablet 4 mg  4 mg Oral Q6H PRN Opyd, Timothy S, MD       Or   ondansetron  (ZOFRAN ) injection 4 mg  4 mg Intravenous Q6H PRN Opyd, Timothy S, MD       pantoprazole  (PROTONIX ) EC tablet 40 mg  40 mg Oral Daily Hongalgi, Anand D, MD   40 mg at 07/02/24 1000   polyethylene glycol (MIRALAX  / GLYCOLAX ) packet 17 g  17 g Oral Daily PRN Hongalgi, Anand D, MD       prochlorperazine  (COMPAZINE ) injection 5 mg  5 mg Intravenous Q6H PRN Opyd, Timothy S, MD   5 mg at 06/30/24 0046   sodium chloride  (OCEAN) 0.65 % nasal spray 1 spray  1 spray Each Nare PRN Chavez, Abigail, NP       sodium chloride  flush (NS) 0.9 % injection 3 mL  3 mL Intravenous Q12H  Opyd, Timothy S, MD   3 mL at 07/02/24 1000   tiZANidine  (ZANAFLEX ) tablet 4 mg  4 mg Oral BID PRN Hongalgi, Anand D, MD   4 mg at 07/01/24 2349     Discharge Medications: Please see discharge summary for a list of discharge medications.  Relevant Imaging Results:  Relevant Lab Results:   Additional Information SSN: [id number]  Jeoffrey LITTIE Moose, LCSWA     "

## 2024-07-02 NOTE — Progress Notes (Signed)
 Occupational Therapy Evaluation Patient Details Name: [name] MRN: [id number] DOB: 1955/10/28 Today's Date: 07/02/2024   History of Present Illness   Pt is 69 yo presenting to [facility] On 1/5 due to nausea/vomiting and abdominal pain from rehab. Recent hospitalization 12/22 with non-operative acetabular fracutre.  PMH legally blind, HTN HLD GERD, RA, Marfan syndrome, colostomy 2/2 diverticulitis and preformation,  atrial flutter, CHF     Clinical Impressions Pt presents with decline in function and safety with ADLs and ADL mobility with impaired strength, balance, endurance and B UE AROM; chronic severe deformities of hand from RA; unable to perform digit opposition, fine grasp, and gross grasp; elbow AROM WFL; shoulder AROM limitations. Pt reports that she has braces at home for hands PTA pt lives alone and reports that she has difficulty feeding herself and does the best she can (has hx of hand deformities) daughter or aide assisted with showers and performed cooking/cleaning. Pt used BSC at night prior to first hospitalization in December mobility, previously used RW and manual W/C for mobility, has HH aide 5 days/wk for 3 hours for showers and home mgt asisst when daughter not there. Pt currently requires min A to sit EOB, min A with UB ADLs, min A with self feeding, max A with LB ADLs, total/max A with toileting tasks, max/mod A STS/SPTs. Pt pleasant and cooperative. OT will folloe acutely to maximize level of function and safety     If plan is discharge home, recommend the following:   A lot of help with walking and/or transfers;A lot of help with bathing/dressing/bathroom;Assistance with cooking/housework;Assist for transportation;Help with stairs or ramp for entrance;Direct supervision/assist for medications management     Functional Status Assessment   Patient has had a recent decline in their functional status and demonstrates the ability to make significant improvements in  function in a reasonable and predictable amount of time.     Equipment Recommendations   Other (comment) (defer)     Recommendations for Other Services         Precautions/Restrictions   Precautions Precautions: Fall Recall of Precautions/Restrictions: Impaired Precaution/Restrictions Comments: blind Restrictions Weight Bearing Restrictions Per Provider Order: Yes RLE Weight Bearing Per Provider Order: Partial weight bearing RLE Partial Weight Bearing Percentage or Pounds: R LE WBAT for transfers and partial 50% WB for ambulation     Mobility Bed Mobility Overal bed mobility: Needs Assistance Bed Mobility: Supine to Sit, Sit to Supine     Supine to sit: Min assist Sit to supine: Min assist   General bed mobility comments: min A with LE mgt and to elevate trunk    Transfers Overall transfer level: Needs assistance Equipment used: Rolling walker (2 wheels) Transfers: Sit to/from Stand Sit to Stand: Max assist                  Balance Overall balance assessment: Needs assistance Sitting-balance support: Feet supported, Bilateral upper extremity supported Sitting balance-Leahy Scale: Fair     Standing balance support: Bilateral upper extremity supported Standing balance-Leahy Scale: Poor                             ADL either performed or assessed with clinical judgement   ADL Overall ADL's : Needs assistance/impaired Eating/Feeding: Minimal assistance;Sitting Eating/Feeding Details (indicate cue type and reason): limited by deformities of hand and digits, due to chronic RA; needs assist to open packets, remove lids and cut food Grooming: Minimal assistance;Sitting  Upper Body Bathing: Minimal assistance;Sitting   Lower Body Bathing: Maximal assistance;Sitting/lateral leans;Sit to/from stand   Upper Body Dressing : Minimal assistance;Sitting   Lower Body Dressing: Maximal assistance;Sitting/lateral leans   Toilet Transfer:  Moderate assistance;BSC/3in1;Rolling walker (2 wheels);Stand-pivot   Toileting- Clothing Manipulation and Hygiene: Maximal assistance;Sit to/from stand Toileting - Clothing Manipulation Details (indicate cue type and reason): at bedside commode level     Functional mobility during ADLs: Moderate assistance;Rolling walker (2 wheels);Cueing for safety       Vision         Perception         Praxis         Pertinent Vitals/Pain Pain Assessment Pain Assessment: Faces Faces Pain Scale: Hurts little more Pain Location: right hip Pain Descriptors / Indicators: Aching, Discomfort Pain Intervention(s): Limited activity within patient's tolerance, Monitored during session, Repositioned     Extremity/Trunk Assessment Upper Extremity Assessment Upper Extremity Assessment: Generalized weakness;Right hand dominant;RUE deficits/detail;LUE deficits/detail RUE Deficits / Details: chronic severe deformities of hand from RA; unable to perform digit opposition, fine grasp, and gross grasp; elbow AROM WFL; shoulder AROM limitations. Pt reports that she has braces at home for hands LUE Deficits / Details: chronic severe deformities of hand from RA; unable to perform digit opposition, fine grasp, and gross grasp; elbow AROM WFL; shoulder AROM limitations       Cervical / Trunk Assessment Cervical / Trunk Assessment: Kyphotic   Communication Communication Communication: No apparent difficulties   Cognition Arousal: Alert Behavior During Therapy: WFL for tasks assessed/performed                                 Following commands: Intact       Cueing  General Comments   Cueing Techniques: Verbal cues;Tactile cues      Exercises     Shoulder Instructions      Home Living Family/patient expects to be discharged to:: Skilled nursing facility Living Arrangements: Alone Available Help at Discharge: Family;Available PRN/intermittently;Personal care attendant Type of  Home: Apartment Home Access: Level entry     Home Layout: One level     Bathroom Shower/Tub: Chief Strategy Officer: Standard Bathroom Accessibility: No   Home Equipment: BSC/3in1;Wheelchair - manual;Tub bench;Lift Restaurant Manager, [employer] (2 wheels)   Additional Comments: has HH aide 5 days/wk for 3 hours for showers and home mgt asisst per pt      Prior Functioning/Environment Prior Level of Function : Needs assist             Mobility Comments: previously used RW and manual W/C for mobility ADLs Comments: pt reports that she has difficulty feeding herself and does the best she can (has hx of hand deformities) daughter or aide assisted with showers and performed cooking/cleaning. Pt used BSC at night prior to first hospitalization in December mobility    OT Problem List: Decreased strength;Decreased range of motion;Impaired balance (sitting and/or standing);Decreased coordination;Impaired vision/perception;Decreased knowledge of use of DME or AE;Decreased knowledge of precautions;Pain;Impaired UE functional use;Decreased activity tolerance   OT Treatment/Interventions: Self-care/ADL training;Therapeutic exercise;Energy conservation;DME and/or AE instruction;Therapeutic activities;Balance training;Patient/family education      OT Goals(Current goals can be found in the care plan section)   Acute Rehab OT Goals Patient Stated Goal: get better, go home Time For Goal Achievement: 07/16/24 Potential to Achieve Goals: Good ADL Goals Pt Will Perform Eating: with min assist;with supervision;with adaptive utensils;sitting Pt Will Perform Grooming:  with contact guard assist;with supervision;with adaptive equipment;sitting Pt Will Perform Upper Body Bathing: with contact guard assist;with supervision;sitting Pt Will Perform Lower Body Bathing: with mod assist;sitting/lateral leans;with caregiver independent in assisting Pt Will Perform Upper Body Dressing: with contact  guard assist;with supervision;sitting Pt Will Transfer to Toilet: with mod assist;with min assist;stand pivot transfer   OT Frequency:  Min 2X/week    Co-evaluation              AM-PAC OT 6 Clicks Daily Activity     Outcome Measure Help from another person eating meals?: A Little Help from another person taking care of personal grooming?: A Little Help from another person toileting, which includes using toliet, bedpan, or urinal?: A Lot Help from another person bathing (including washing, rinsing, drying)?: A Lot Help from another person to put on and taking off regular upper body clothing?: A Little Help from another person to put on and taking off regular lower body clothing?: A Lot 6 Click Score: 15   End of Session Equipment Utilized During Treatment: Rolling walker (2 wheels);Gait belt;Other (comment) [facility]) Nurse Communication: Mobility status  Activity Tolerance: Patient limited by pain;Patient limited by fatigue Patient left: in bed;with call bell/phone within reach;with bed alarm set  OT Visit Diagnosis: Unsteadiness on feet (R26.81);Other abnormalities of gait and mobility (R26.89);Muscle weakness (generalized) (M62.81);History of falling (Z91.81);Low vision, both eyes (H54.2);Feeding difficulties (R63.3);Pain Pain - Right/Left: Right Pain - part of body: Hip;Leg                Time: 8946-8882 OT Time Calculation (min): 24 min Charges:  OT General Charges $OT Visit: 1 Visit OT Evaluation $OT Eval Moderate Complexity: 1 Mod    Jacques Karna Loose 07/02/2024, 3:18 PM

## 2024-07-03 DIAGNOSIS — K56609 Unspecified intestinal obstruction, unspecified as to partial versus complete obstruction: Secondary | ICD-10-CM | POA: Diagnosis not present

## 2024-07-03 LAB — BASIC METABOLIC PANEL WITH GFR
Anion gap: 8 (ref 5–15)
BUN: 16 mg/dL (ref 8–23)
CO2: 26 mmol/L (ref 22–32)
Calcium: 8.1 mg/dL — ABNORMAL LOW (ref 8.9–10.3)
Chloride: 109 mmol/L (ref 98–111)
Creatinine, Ser: 0.54 mg/dL (ref 0.44–1.00)
GFR, Estimated: >60 mL/min
Glucose, Bld: 88 mg/dL (ref 70–99)
Potassium: 3.6 mmol/L (ref 3.5–5.1)
Sodium: 143 mmol/L (ref 135–145)

## 2024-07-03 LAB — CBC
HCT: 30.8 % — ABNORMAL LOW (ref 36.0–46.0)
Hemoglobin: 10.1 g/dL — ABNORMAL LOW (ref 12.0–15.0)
MCH: 31.8 pg (ref 26.0–34.0)
MCHC: 32.8 g/dL (ref 30.0–36.0)
MCV: 96.9 fL (ref 80.0–100.0)
Platelets: 253 K/uL (ref 150–400)
RBC: 3.18 MIL/uL — ABNORMAL LOW (ref 3.87–5.11)
RDW: 13.8 % (ref 11.5–15.5)
WBC: 7.2 K/uL (ref 4.0–10.5)
nRBC: 0 % (ref 0.0–0.2)

## 2024-07-03 NOTE — Plan of Care (Signed)
  Problem: Health Behavior/Discharge Planning: Goal: Ability to manage health-related needs will improve Outcome: Progressing   Problem: Coping: Goal: Level of anxiety will decrease Outcome: Progressing   Problem: Pain Managment: Goal: General experience of comfort will improve and/or be controlled Outcome: Progressing

## 2024-07-03 NOTE — Progress Notes (Signed)
 "  Progress Note     Subjective: Patient reports feeling better since having a bowel movement yesterday. No BM this morning but having flatulence in her ostomy bag. Denies n/v. Denies pain and abdominal distention has improved. Tolerating D3 diet.   ROS  All negative with the exception of above.  Objective: Vital signs in last 24 hours: Temp:  [98.1 F (36.7 C)-98.2 F (36.8 C)] 98.2 F (36.8 C) (01/09 0553) Pulse Rate:  [62-106] 62 (01/09 0553) Resp:  [16-18] 17 (01/09 0553) BP: (119-135)/(57-77) 135/66 (01/09 0553) SpO2:  [98 %-100 %] 98 % (01/09 0553) Last BM Date : 07/02/24  Intake/Output from previous day: 01/08 0701 - 01/09 0700 In: 600 [P.O.:600] Out: 1525 [Stool:1525] Intake/Output this shift: No intake/output data recorded.  PE: General: Pleasant female who is laying in bed in NAD. HEENT: Head is normocephalic, atraumatic.  Heart: HR normal during encounter.   Lungs: Respiratory effort nonlabored. Abd: Soft, NT. Large ventral hernia noted. Minimal abdominal distention (improved since yesterday). Ostomy noted of left abdomen. Stoma viable. Small amount of liquid brown stool in collection bag. Air present in bag as well. No rebound tenderness or guarding. Skin: Warm and dry. Psych: A&Ox3 with an appropriate affect.   Lab Results:  Recent Labs    07/01/24 0407 07/03/24 0625  WBC 4.8 7.2  HGB 10.9* 10.1*  HCT 34.5* 30.8*  PLT 296 253   BMET Recent Labs    07/01/24 0407 07/03/24 0625  NA 140 143  K 3.7 3.6  CL 104 109  CO2 27 26  GLUCOSE 94 88  BUN 25* 16  CREATININE 0.65 0.54  CALCIUM  8.8* 8.1*   PT/INR No results for input(s): LABPROT, INR in the last 72 hours. CMP     Component Value Date/Time   NA 143 07/03/2024 0625   NA 143 09/10/2016 0000   K 3.6 07/03/2024 0625   CL 109 07/03/2024 0625   CO2 26 07/03/2024 0625   GLUCOSE 88 07/03/2024 0625   BUN 16 07/03/2024 0625   BUN 14 09/10/2016 0000   CREATININE 0.54 07/03/2024 0625    CREATININE 0.76 11/19/2017 1024   CALCIUM  8.1 (L) 07/03/2024 0625   PROT 6.4 (L) 07/01/2024 0407   ALBUMIN 3.7 07/01/2024 0407   AST 38 07/01/2024 0407   ALT 36 07/01/2024 0407   ALKPHOS 105 07/01/2024 0407   BILITOT 0.6 07/01/2024 0407   GFRNONAA >60 07/03/2024 0625   GFRNONAA 85 11/19/2017 1024   GFRAA 98 11/19/2017 1024   Lipase     Component Value Date/Time   LIPASE 25 06/29/2024 2010       Studies/Results: DG Abd Portable 1V-Small Bowel Obstruction Protocol-initial, 8 hr delay Result Date: 07/02/2024 EXAM: 1 VIEW XRAY OF THE ABDOMEN 07/02/2024 10:09:00 PM COMPARISON: Comparison with 07/02/2024. CLINICAL HISTORY: FINDINGS: BOWEL: Examination is obtained 8 hours after administration of oral contrast material. Contrast material is demonstrated in the cecum suggesting no evidence of bowel obstruction. Nonobstructive bowel gas pattern. SOFT TISSUES: A left lower quadrant ostomy is present. No abnormal calcifications. BONES: Prominent thoracolumbar scoliosis convex towards the left. Prominent degenerative changes in the lumbar spine and hips. No acute fracture. LUNG BASES: Lung bases are clear. IMPRESSION: 1. No evidence of bowel obstruction. 2. Left lower quadrant ostomy. Electronically signed by: Elsie Gravely MD 07/02/2024 10:12 PM EST RP Workstation: [id number]   DG Abd Portable 1V Result Date: 07/02/2024 CLINICAL DATA:  Small bowel obstruction EXAM: PORTABLE ABDOMEN - 1 VIEW COMPARISON:  06/30/2024 FINDINGS: NG  tube has been removed in the interval. Diffuse gas-filled small bowel loops and colon are seen in the abdomen and pelvis in a nonspecific pattern. Bowel gas pattern does not appear frankly obstructive although component of underlying ileus not excluded. Marked convex leftward scoliosis of the upper lumbar spine again noted. Degenerative changes evident in both hips, right greater than left. IMPRESSION: Nonspecific bowel gas pattern with diffuse gas-filled small bowel loops and  colon. Component of underlying ileus not excluded. Electronically Signed   By: Camellia Candle M.D.   On: 07/02/2024 10:03    Anti-infectives: Anti-infectives (From admission, onward)    None        Assessment/Plan 69 yo female with bowel obstruction -CT from 1/5 show a loop of intestine moving around the left colon as it goes to the ostomy site. Not thought to be a closed loop obstruction.  -NGT removed 1/6. -General surgery reengaged 1/8 due to concern of decreased stool output.  -Patient completed small bowel protocol with oral contrast 1/8. 8 hr film shows contrast in the cecum. No evidence of bowel obstruction.  -Afebrile. -WBC 7.2 and HGB 10.1  -Denies pain. Denies n/v. Tolerating D3 diet. Having stool output. Abdominal distention has improved. -Patient can advance diet as tolerated.  -Stable from general surgery standpoint. General surgery will sign off. Please call for further questions and concerns.    FEN: D3; IVF per primary team VTE: SCDs ID: None currently   LOS: 4 days   I reviewed hospitalist notes, specialist notes, nursing notes, last 24 h vitals and pain scores, last 48 h intake and output, last 24 h labs and trends, and last 24 h imaging results.  This care required moderate level of medical decision making.    [name], [facility] 07/03/2024, 8:40 AM Please see Amion for pager number during day hours 7:00am-4:30pm  "

## 2024-07-03 NOTE — Progress Notes (Addendum)
 " PROGRESS NOTE   [name]  FMW:[id number]    DOB: 01-15-1956    DOA: 06/29/2024  PCP: [name] LABOR, MD   I have briefly reviewed patients previous medical records in [place].   Brief Hospital Course:   69 year old female, recently lived alone, has a caregiver assistance at home, hospitalized 12/22 - 12/26 for closed right acetabular fracture and discharged to SNF, with PMH paroxysmal A-flutter s/p ablation, HTN, CVA, chronic systolic CHF, severe rheumatoid arthritis, gout, bilateral blindness, has right hearing aid, perforated diverticulitis 2014 s/p sigmoid colectomy with ostomy in place, multiple bowel obstructions, right acetabular fracture last month who presented with nausea, vomiting, abdominal pain.   CT A/P on admission showed dilated proximal and mid small bowel with a short transitional segment noted in the central abdomen from presumed underlying adhesions causing bowel obstruction.  Known large ventral hernia at the umbilical level felt to possibly be closed-loop obstruction on CT but after evaluation by surgery, not felt to be consistent with that.   General surgery consulted, managed conservatively with bowel rest, NG tube with improvement, NG tube removed 1/6.  S/p SB protocol 1/8.  SBO resolved.  CCS signed off.   Assessment & Plan:   SBO (small bowel obstruction) (HCC) - History of bowel obstructions and sigmoid colectomy.  Ostomy in place with minimal output prior to admission -CT showing SBO concerning for underlying adhesions -Some concern on CT for closed-loop obstruction involving ventral hernia however not felt to represent this after evaluation by surgery General Surgery was consulted.  Treated conservatively with bowel rest, NG tube, IVF with improvement.  NG tube was removed on 1/6.  Diet was advanced to soft diet and General Surgery signed off 1/7. However on 1/8, patient reported recurrent abdominal fullness without significant any stool in colostomy  for approximately 48 hours prior. KUB showed nonspecific bowel gas pattern with diffuse gas-filled small bowel loops and colon. Component of underlying ileus not excluded.  Reengaged CCS, s/p SB protocol 1/8 followed by good stool output through colostomy.  Follow-up KUB shows no evidence of bowel obstruction.  General surgery signed off.   Paroxysmal atrial flutter (HCC) - Continue p.o. metoprolol . - Not on anticoagulation; history of ablation but A-fib recurred   Closed right acetabular fracture (HCC) - known on CT: Recent nondisplaced fracture of the posterior column of the right acetabulum.  - Hospitalized 12/22 - 12/26 after a fall resulting in right acetabular fracture.  Recommended for 50% WB on right side with ambulation and WBAT for transfers - outpt follow upwith ortho 2-3 weeks post fall - PT and OT evaluation.  PT recommends SNF.  TOC on board.  Awaiting insurance authorization for SNF.   Rheumatoid arthritis (HCC) - Severe contractures and end-stage appearing deformities noted in bilateral upper extremities/especially hands and fingers - Received weekly dose of abatacept  SQ and methotrexate  subcu on 1/8. - Sees Dr. Jon Jacob, Rheumatology.   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  Anemia of chronic disease Stable compared to recent.    Body mass index is 22.02 kg/m.   DVT prophylaxis: SCDs Start: 06/29/24 2340     Code Status: Full Code:  Family Communication: Daughter via phone.  Disposition:  Medically optimized for DC to SNF pending insurance authorization.  Consultants:   General surgery  Procedures:   NG tube-removed 1/6.  Subjective:  Seen this morning along with patient's female RN at bedside.  Patient denies complaints.  No further  abdominal bloating, pain.  Tolerating diet well per nursing.  1525 mL stool output from colostomy documented for yesterday.  Objective:   Vitals:   07/02/24 1704 07/02/24 2227 07/03/24 0553 07/03/24 1026  BP: (!) 125/57 119/77 135/66 119/74  Pulse: (!) 106 71 62 85  Resp: 17 16 17 17   Temp: 98.1 F (36.7 C) 98.2 F (36.8 C) 98.2 F (36.8 C) 98 F (36.7 C)  TempSrc: Oral  Oral Oral  SpO2: 100% 99% 98% 96%  Weight:      Height:        General exam: Middle-age female, looks older than stated age, small built, frail and thinly nourished, chronically ill looking lying comfortably propped up in bed without distress.  Has right sided hearing aid. Respiratory system: Clear to auscultation. Respiratory effort normal. Cardiovascular system: S1 & S2 heard, RRR. No JVD, murmurs, rubs, gallops or clicks. No pedal edema. Gastrointestinal system: Abdomen is nondistended, soft and nontender. No organomegaly or masses felt. Normal bowel sounds heard.  Left-sided colostomy with some liquid stools noted.  Large ventral hernia but uncomplicated and easily reducible. Central nervous system: Alert and oriented. No focal neurological deficits. Extremities: Symmetric 5 x 5 power.  Chronic and end-stage appearing deformities of both hands and fingers, likely related to rheumatoid arthritis.  Right corneal opacity.  Blind in both eyes. Skin: No rashes, lesions or ulcers Psychiatry: Judgement and insight appear normal. Mood & affect appropriate.     Data Reviewed:   I have personally reviewed following labs and imaging studies   CBC: Recent Labs  Lab 06/29/24 2010 06/29/24 2025 07/01/24 0407 07/03/24 0625  WBC 7.8  --  4.8 7.2  NEUTROABS 6.4  --  2.6  --   HGB 14.2 15.3* 10.9* 10.1*  HCT 44.5 45.0 34.5* 30.8*  MCV 96.9  --  98.6 96.9  PLT 418*  --  296 253    Basic Metabolic Panel: Recent Labs  Lab 06/29/24 2010 06/29/24 2025 06/30/24 0426 07/01/24 0407 07/03/24 0625  NA 143 141  --   140 143  K 4.7 4.5  --  3.7 3.6  CL 99 102  --  104 109  CO2 27  --   --  27 26  GLUCOSE 136* 134*  --  94 88  BUN 32* 33*  --  25* 16  CREATININE 0.93 1.00  --  0.65 0.54  CALCIUM  11.2*  --   --  8.8* 8.1*  MG  --   --  1.7 1.8  --     Liver Function Tests: Recent Labs  Lab 06/29/24 2010 07/01/24 0407  AST 47* 38  ALT 38 36  ALKPHOS 151* 105  BILITOT 0.8 0.6  PROT 8.3* 6.4*  ALBUMIN 4.5 3.7    CBG: No results for input(s): GLUCAP in the last 168 hours.  Microbiology Studies:  No results found for this or any previous visit (from the past 240 hours).  Radiology Studies:  DG Abd Portable 1V-Small Bowel Obstruction Protocol-initial, 8 hr delay Result Date: 07/02/2024 EXAM: 1 VIEW XRAY OF THE ABDOMEN 07/02/2024 10:09:00 PM COMPARISON: Comparison with 07/02/2024. CLINICAL HISTORY: FINDINGS: BOWEL: Examination is obtained 8 hours after administration of oral contrast material. Contrast material is demonstrated in the cecum suggesting no evidence of bowel obstruction. Nonobstructive bowel gas  pattern. SOFT TISSUES: A left lower quadrant ostomy is present. No abnormal calcifications. BONES: Prominent thoracolumbar scoliosis convex towards the left. Prominent degenerative changes in the lumbar spine and hips. No acute fracture. LUNG BASES: Lung bases are clear. IMPRESSION: 1. No evidence of bowel obstruction. 2. Left lower quadrant ostomy. Electronically signed by: Elsie Gravely MD 07/02/2024 10:12 PM EST RP Workstation: [id number]   DG Abd Portable 1V Result Date: 07/02/2024 CLINICAL DATA:  Small bowel obstruction EXAM: PORTABLE ABDOMEN - 1 VIEW COMPARISON:  06/30/2024 FINDINGS: NG tube has been removed in the interval. Diffuse gas-filled small bowel loops and colon are seen in the abdomen and pelvis in a nonspecific pattern. Bowel gas pattern does not appear frankly obstructive although component of underlying ileus not excluded. Marked convex leftward scoliosis of the upper lumbar  spine again noted. Degenerative changes evident in both hips, right greater than left. IMPRESSION: Nonspecific bowel gas pattern with diffuse gas-filled small bowel loops and colon. Component of underlying ileus not excluded. Electronically Signed   By: Camellia Candle M.D.   On: 07/02/2024 10:03    Scheduled Meds:    allopurinol   100 mg Oral Daily   aspirin  EC  81 mg Oral Daily   cholecalciferol   2,000 Units Oral Daily   docusate sodium   100 mg Oral BID   folic acid   500 mcg Oral Daily   loratadine   10 mg Oral Daily   metoprolol  succinate  25 mg Oral Daily   multivitamin with minerals  1 tablet Oral Daily   pantoprazole   40 mg Oral Daily   sodium chloride  flush  3 mL Intravenous Q12H    Continuous Infusions:       LOS: 4 days     Trenda Mar, MD,  FACP, [facility], [facility], [facility]   [facility] [place]   To contact the attending provider between 7A-7P or the covering provider during after hours 7P-7A, please log into the web site [email or web address] and access using universal [place] password for that web site. If you do not have the password, please call the hospital operator.  07/03/2024, 3:09 PM   "

## 2024-07-04 ENCOUNTER — Inpatient Hospital Stay (HOSPITAL_COMMUNITY)

## 2024-07-04 ENCOUNTER — Ambulatory Visit: Payer: Self-pay | Admitting: Internal Medicine

## 2024-07-04 DIAGNOSIS — K56609 Unspecified intestinal obstruction, unspecified as to partial versus complete obstruction: Secondary | ICD-10-CM | POA: Diagnosis not present

## 2024-07-04 NOTE — Plan of Care (Signed)

## 2024-07-04 NOTE — Progress Notes (Signed)
 " PROGRESS NOTE   [name]  FMW:[id number]    DOB: 11-14-1955    DOA: 06/29/2024  PCP: [name] LABOR, MD   I have briefly reviewed patients previous medical records in [facility].   Brief Hospital Course:   69 year old female, recently lived alone, has a caregiver assistance at home, hospitalized 12/22 - 12/26 for closed right acetabular fracture and discharged to SNF, with PMH paroxysmal A-flutter s/p ablation, HTN, CVA, chronic systolic CHF, severe rheumatoid arthritis, gout, bilateral blindness, has right hearing aid, perforated diverticulitis 2014 s/p sigmoid colectomy with ostomy in place, multiple bowel obstructions, right acetabular fracture last month who presented with nausea, vomiting, abdominal pain.   CT A/P on admission showed dilated proximal and mid small bowel with a short transitional segment noted in the central abdomen from presumed underlying adhesions causing bowel obstruction.  Known large ventral hernia at the umbilical level felt to possibly be closed-loop obstruction on CT but after evaluation by surgery, not felt to be consistent with that.   General surgery consulted, managed conservatively with bowel rest, NG tube with improvement, NG tube removed 1/6.  S/p SB protocol 1/8.  SBO resolved.  CCS signed off.   Assessment & Plan:   SBO (small bowel obstruction) (HCC) - History of bowel obstructions and sigmoid colectomy.  Ostomy in place with minimal output prior to admission -CT showing SBO concerning for underlying adhesions -Some concern on CT for closed-loop obstruction involving ventral hernia however not felt to represent this after evaluation by surgery General Surgery was consulted.  Treated conservatively with bowel rest, NG tube, IVF with improvement.  NG tube was removed on 1/6.  Diet was advanced to soft diet and General Surgery signed off 1/7. However on 1/8, patient reported recurrent abdominal fullness without significant any stool in colostomy  for approximately 48 hours prior. KUB showed nonspecific bowel gas pattern with diffuse gas-filled small bowel loops and colon. Component of underlying ileus not excluded.  Reengaged CCS, s/p SB protocol 1/8 followed by good stool output through colostomy.  Follow-up KUB shows no evidence of bowel obstruction.  General surgery signed off. 1/10: Patient reporting some nausea.  No ostomy output over the last 24 hours.  Per nursing, no ostomy output last night.  Repeated KUB: Normal bowel gas pattern with radiopaque contrast within the mid and distal ascending colon.  Continue with diet, close monitoring clinically and of the ostomy output.  Discussed with RN.   Paroxysmal atrial flutter (HCC) - Continue p.o. metoprolol . - Not on anticoagulation; history of ablation but A-fib recurred   Closed right acetabular fracture (HCC) - known on CT: Recent nondisplaced fracture of the posterior column of the right acetabulum.  - Hospitalized 12/22 - 12/26 after a fall resulting in right acetabular fracture.  Recommended for 50% WB on right side with ambulation and WBAT for transfers - outpt follow upwith ortho 2-3 weeks post fall - PT and OT evaluation.  PT recommends SNF.  TOC on board.  Awaiting insurance authorization for SNF.   Rheumatoid arthritis (HCC) - Severe contractures and end-stage appearing deformities noted in bilateral upper extremities/especially hands and fingers - Received weekly dose of abatacept  SQ and methotrexate  subcu on 1/8. - Sees Dr. Jon Jacob, Rheumatology.   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  Anemia of chronic disease Stable compared to recent.    Body mass index is 22.02 kg/m.   DVT prophylaxis: SCDs Start: 06/29/24 2340     Code Status: Full Code:   Family Communication: Daughter via phone.  Disposition:  Medically optimized for DC to SNF pending insurance authorization.     Consultants:   General surgery  Procedures:   NG tube-removed 1/6.  Subjective:  Patient reports some nausea this morning and decreased appetite but no abdominal pain or distention.  Ostomy output documentation as above.  Objective:   Vitals:   07/03/24 2052 07/04/24 0016 07/04/24 0413 07/04/24 0927  BP: 116/71 116/75 117/74 125/69  Pulse: 76 73 79 (!) 54  Resp: 16 17 16 17   Temp: 98.5 F (36.9 C) 98.3 F (36.8 C) 98 F (36.7 C) 97.8 F (36.6 C)  TempSrc: Oral Oral Oral Oral  SpO2: 100% 99% 94% 100%  Weight:      Height:        General exam: Middle-age female, looks older than stated age, small built, frail and thinly nourished, chronically ill looking lying comfortably propped up in bed without distress.  Has right sided hearing aid. Respiratory system: Clear to auscultation. Respiratory effort normal. Cardiovascular system: S1 & S2 heard, RRR. No JVD, murmurs, rubs, gallops or clicks. No pedal edema. Gastrointestinal system: Abdomen is nondistended, soft and nontender. No organomegaly or masses felt. Normal bowel sounds heard.  Left-sided colostomy was just changed this morning and empty.  Large ventral hernia but uncomplicated and easily reducible. Central nervous system: Alert and oriented. No focal neurological deficits. Extremities: Symmetric 5 x 5 power.  Chronic and end-stage appearing deformities of both hands and fingers, likely related to rheumatoid arthritis.  Right corneal opacity.  Blind in both eyes. Skin: No rashes, lesions or ulcers Psychiatry: Judgement and insight appear normal. Mood & affect appropriate.     Data Reviewed:   I have personally reviewed following labs and imaging studies   CBC: Recent Labs  Lab 06/29/24 2010 06/29/24 2025 07/01/24 0407 07/03/24 0625  WBC 7.8  --  4.8 7.2  NEUTROABS 6.4  --  2.6  --    HGB 14.2 15.3* 10.9* 10.1*  HCT 44.5 45.0 34.5* 30.8*  MCV 96.9  --  98.6 96.9  PLT 418*  --  296 253    Basic Metabolic Panel: Recent Labs  Lab 06/29/24 2010 06/29/24 2025 06/30/24 0426 07/01/24 0407 07/03/24 0625  NA 143 141  --  140 143  K 4.7 4.5  --  3.7 3.6  CL 99 102  --  104 109  CO2 27  --   --  27 26  GLUCOSE 136* 134*  --  94 88  BUN 32* 33*  --  25* 16  CREATININE 0.93 1.00  --  0.65 0.54  CALCIUM  11.2*  --   --  8.8* 8.1*  MG  --   --  1.7 1.8  --     Liver Function Tests: Recent Labs  Lab 06/29/24 2010 07/01/24 0407  AST 47* 38  ALT 38 36  ALKPHOS 151* 105  BILITOT 0.8 0.6  PROT 8.3* 6.4*  ALBUMIN 4.5 3.7    CBG: No results for input(s): GLUCAP in the last 168 hours.  Microbiology Studies:  No results found for this or any previous visit (from the past 240 hours).  Radiology Studies:  DG Abd Portable 1V Result Date: 07/04/2024 CLINICAL DATA:  Small bowel obstruction. EXAM: PORTABLE ABDOMEN -  1 VIEW COMPARISON:  July 02, 2024 FINDINGS: The bowel gas pattern is normal. Radiopaque contrast is seen within the mid and distal ascending colon. The left lower quadrant ostomy site is noted. No radio-opaque calculi are seen. There is marked severity levoscoliosis of the lumbar spine with marked severity multilevel degenerative changes. IMPRESSION: Radiopaque contrast within the mid and distal ascending colon. Electronically Signed   By: Suzen Dials M.D.   On: 07/04/2024 10:43   DG Abd Portable 1V-Small Bowel Obstruction Protocol-initial, 8 hr delay Result Date: 07/02/2024 EXAM: 1 VIEW XRAY OF THE ABDOMEN 07/02/2024 10:09:00 PM COMPARISON: Comparison with 07/02/2024. CLINICAL HISTORY: FINDINGS: BOWEL: Examination is obtained 8 hours after administration of oral contrast material. Contrast material is demonstrated in the cecum suggesting no evidence of bowel obstruction. Nonobstructive bowel gas pattern. SOFT TISSUES: A left lower quadrant ostomy is  present. No abnormal calcifications. BONES: Prominent thoracolumbar scoliosis convex towards the left. Prominent degenerative changes in the lumbar spine and hips. No acute fracture. LUNG BASES: Lung bases are clear. IMPRESSION: 1. No evidence of bowel obstruction. 2. Left lower quadrant ostomy. Electronically signed by: Elsie Gravely MD 07/02/2024 10:12 PM EST RP Workstation: [id number]    Scheduled Meds:    allopurinol   100 mg Oral Daily   aspirin  EC  81 mg Oral Daily   cholecalciferol   2,000 Units Oral Daily   docusate sodium   100 mg Oral BID   folic acid   500 mcg Oral Daily   loratadine   10 mg Oral Daily   metoprolol  succinate  25 mg Oral Daily   multivitamin with minerals  1 tablet Oral Daily   pantoprazole   40 mg Oral Daily   sodium chloride  flush  3 mL Intravenous Q12H    Continuous Infusions:       LOS: 5 days     Trenda Mar, MD,  FACP, [facility], [facility], [facility]   [facility] [place]   To contact the attending provider between 7A-7P or the covering provider during after hours 7P-7A, please log into the web site [email or web address] and access using universal [place] password for that web site. If you do not have the password, please call the hospital operator.  07/04/2024, 4:47 PM   "

## 2024-07-04 NOTE — TOC Progression Note (Signed)
 Transition of Care [facility]) - Progression Note    Patient Details  Name: [name] MRN: [id number] Date of Birth: [birth year]  Transition of Care [facility] CM/SW Contact  Atalie Oros A Dezarae Mcclaran, LCSW Phone Number: 07/04/2024, 12:19 PM  Clinical Narrative:     CSW contacted [facility] Team, spoke with agent. They informed CSW that pt's authorization is still pending and is under Medical Director Review. No additional clinicals needed at this time.   CSW will continue to follow.     Barriers to Discharge: Continued Medical Work up               Expected Discharge Plan and Services       Living arrangements for the past 2 months: Apartment                                       Social Drivers of Health (SDOH) Interventions SDOH Screenings   Food Insecurity: No Food Insecurity (06/30/2024)  Housing: Low Risk (06/30/2024)  Transportation Needs: No Transportation Needs (06/30/2024)  Utilities: Not At Risk (06/30/2024)  Depression (PHQ2-9): Low Risk (10/01/2023)  Financial Resource Strain: Low Risk (10/16/2021)  Physical Activity: Inactive (10/16/2021)  Social Connections: Moderately Isolated (06/30/2024)  Stress: Stress Concern Present (10/16/2021)  Tobacco Use: Medium Risk (06/29/2024)    Readmission Risk Interventions    06/19/2024   12:56 PM  Readmission Risk Prevention Plan  Post Dischage Appt Complete  Medication Screening Complete  Transportation Screening Complete

## 2024-07-05 DIAGNOSIS — K56609 Unspecified intestinal obstruction, unspecified as to partial versus complete obstruction: Secondary | ICD-10-CM | POA: Diagnosis not present

## 2024-07-05 MED ORDER — ALBUTEROL SULFATE (2.5 MG/3ML) 0.083% IN NEBU
2.5000 mg | INHALATION_SOLUTION | Freq: Four times a day (QID) | RESPIRATORY_TRACT | Status: DC | PRN
  Administered 2024-07-05: 2.5 mg via RESPIRATORY_TRACT
  Filled 2024-07-05: qty 3

## 2024-07-05 MED ORDER — POLYETHYLENE GLYCOL 3350 17 G PO PACK
17.0000 g | PACK | Freq: Two times a day (BID) | ORAL | Status: DC
  Administered 2024-07-06: 17 g via ORAL
  Filled 2024-07-05 (×6): qty 1

## 2024-07-05 NOTE — Progress Notes (Addendum)
 " PROGRESS NOTE   [name]  FMW:[id number]    DOB: 19-Jan-1956    DOA: 06/29/2024  PCP: [name] LABOR, MD   I have briefly reviewed patients previous medical records in [facility].   Brief Hospital Course:   69 year old female, recently lived alone, has a caregiver assistance at home, hospitalized 12/22 - 12/26 for closed right acetabular fracture and discharged to SNF, with PMH paroxysmal A-flutter s/p ablation, HTN, CVA, chronic systolic CHF, severe rheumatoid arthritis, gout, bilateral blindness, has right hearing aid, perforated diverticulitis 2014 s/p sigmoid colectomy with ostomy in place, multiple bowel obstructions, right acetabular fracture last month who presented with nausea, vomiting, abdominal pain.   CT A/P on admission showed dilated proximal and mid small bowel with a short transitional segment noted in the central abdomen from presumed underlying adhesions causing bowel obstruction.  Known large ventral hernia at the umbilical level felt to possibly be closed-loop obstruction on CT but after evaluation by surgery, not felt to be consistent with that.   General surgery consulted, managed conservatively with bowel rest, NG tube with improvement, NG tube removed 1/6.  S/p SB protocol 1/8.  SBO resolved.  CCS signed off.   Assessment & Plan:   SBO (small bowel obstruction) (HCC) - History of bowel obstructions and sigmoid colectomy.  Ostomy in place with minimal output prior to admission -CT showing SBO concerning for underlying adhesions -Some concern on CT for closed-loop obstruction involving ventral hernia however not felt to represent this after evaluation by surgery General Surgery was consulted.  Treated conservatively with bowel rest, NG tube, IVF with improvement.  NG tube was removed on 1/6.  Diet was advanced to soft diet and General Surgery signed off 1/7. However on 1/8, patient reported recurrent abdominal fullness without significant any stool in colostomy  for approximately 48 hours prior. KUB showed nonspecific bowel gas pattern with diffuse gas-filled small bowel loops and colon. Component of underlying ileus not excluded.  Reengaged CCS, s/p SB protocol 1/8 followed by good stool output through colostomy.  Follow-up KUB shows no evidence of bowel obstruction.  General surgery signed off. Over the last 48 hours, have not seen any output through the colostomy nor much gas.  Repeated KUB 1/10: Normal bowel gas pattern with radiopaque contrast within the mid and distal ascending colon.  However clinically with benign abdomen and no obstructive features or symptoms.  It is somewhat concerning that she has not had colostomy output.  Continue with diet, close monitoring clinically and of the ostomy output.  Discussed with RN.?  Ileus.  Consider repeating KUB in a.m. if without BM.  Continue Colace.  Added MiraLAX  17 g twice daily.   Paroxysmal atrial flutter (HCC) - Continue p.o. metoprolol . - Not on anticoagulation; history of ablation but A-fib recurred   Closed right acetabular fracture (HCC) - known on CT: Recent nondisplaced fracture of the posterior column of the right acetabulum.  - Hospitalized 12/22 - 12/26 after a fall resulting in right acetabular fracture.  Recommended for 50% WB on right side with ambulation and WBAT for transfers - outpt follow upwith ortho 2-3 weeks post fall - PT and OT evaluation.  PT recommends SNF.  TOC on board.  Awaiting insurance authorization for SNF.   Rheumatoid arthritis (HCC) - Severe contractures and end-stage appearing deformities noted in bilateral upper extremities/especially hands and fingers - Received weekly dose of abatacept  SQ and methotrexate  subcu on 1/8. - Sees Dr. Jon Jacob, Rheumatology.   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  Anemia of chronic disease Stable compared to recent.    Body mass index is 21.49 kg/m.   DVT prophylaxis: SCDs Start: 06/29/24 2340     Code Status: Full Code:  Family Communication: Daughter via phone.  Disposition:  Medically optimized for DC to SNF pending insurance authorization.     Consultants:   General surgery  Procedures:   NG tube-removed 1/6.  Subjective:  Patient reports that every 6 months maybe she has bowel obstruction and she works hard to avoid it.  Did not have much of an appetite all day yesterday but ate well last night.  She indicates no gas or stool output through colostomy for 2 days since the blowout prior to that.  No abdominal pain or distention.  No nausea or vomiting.  Objective:   Vitals:   07/04/24 2144 07/05/24 0500 07/05/24 0501 07/05/24 0927  BP: 125/76  (!) 154/71 (!) 172/86  Pulse: 71  (!) 51 64  Resp: 17  17 17   Temp: 98.4 F (36.9 C)  98.8 F (37.1 C) 98.4 F (36.9 C)  TempSrc: Oral  Oral   SpO2: 99%  97% 98%  Weight:  64.1 kg    Height:        General exam: Middle-age female, looks older than stated age, small built, frail and thinly nourished, chronically ill looking lying comfortably propped up in bed without distress.  Has right sided hearing aid. Respiratory system: Clear to auscultation. Respiratory effort normal. Cardiovascular system: S1 & S2 heard, RRR. No JVD, murmurs, rubs, gallops or clicks. No pedal edema. Gastrointestinal system: Abdomen is nondistended, soft and nontender. No organomegaly or masses felt. Normal bowel sounds heard.  Left-sided colostomy without any stool.  Large ventral hernia but uncomplicated and easily reducible. Central nervous system: Alert and oriented. No focal neurological deficits. Extremities: Symmetric 5 x 5 power.  Chronic and end-stage appearing deformities of both hands and fingers, likely related to rheumatoid arthritis.  Right  corneal opacity.  Blind in both eyes. Skin: No rashes, lesions or ulcers Psychiatry: Judgement and insight appear normal. Mood & affect appropriate.     Data Reviewed:   I have personally reviewed following labs and imaging studies   CBC: Recent Labs  Lab 06/29/24 2010 06/29/24 2025 07/01/24 0407 07/03/24 0625  WBC 7.8  --  4.8 7.2  NEUTROABS 6.4  --  2.6  --   HGB 14.2 15.3* 10.9* 10.1*  HCT 44.5 45.0 34.5* 30.8*  MCV 96.9  --  98.6 96.9  PLT 418*  --  296 253    Basic Metabolic Panel: Recent Labs  Lab 06/29/24 2010 06/29/24 2025 06/30/24 0426 07/01/24 0407 07/03/24 0625  NA 143 141  --  140 143  K 4.7 4.5  --  3.7 3.6  CL 99 102  --  104 109  CO2 27  --   --  27 26  GLUCOSE 136* 134*  --  94 88  BUN 32* 33*  --  25* 16  CREATININE 0.93 1.00  --  0.65 0.54  CALCIUM  11.2*  --   --  8.8* 8.1*  MG  --   --  1.7 1.8  --     Liver Function Tests: Recent Labs  Lab 06/29/24  2010 07/01/24 0407  AST 47* 38  ALT 38 36  ALKPHOS 151* 105  BILITOT 0.8 0.6  PROT 8.3* 6.4*  ALBUMIN 4.5 3.7    CBG: No results for input(s): GLUCAP in the last 168 hours.  Microbiology Studies:  No results found for this or any previous visit (from the past 240 hours).  Radiology Studies:  DG Abd Portable 1V Result Date: 07/04/2024 CLINICAL DATA:  Small bowel obstruction. EXAM: PORTABLE ABDOMEN - 1 VIEW COMPARISON:  July 02, 2024 FINDINGS: The bowel gas pattern is normal. Radiopaque contrast is seen within the mid and distal ascending colon. The left lower quadrant ostomy site is noted. No radio-opaque calculi are seen. There is marked severity levoscoliosis of the lumbar spine with marked severity multilevel degenerative changes. IMPRESSION: Radiopaque contrast within the mid and distal ascending colon. Electronically Signed   By: Suzen Dials M.D.   On: 07/04/2024 10:43    Scheduled Meds:    allopurinol   100 mg Oral Daily   aspirin  EC  81 mg Oral Daily    cholecalciferol   2,000 Units Oral Daily   docusate sodium   100 mg Oral BID   folic acid   500 mcg Oral Daily   loratadine   10 mg Oral Daily   metoprolol  succinate  25 mg Oral Daily   multivitamin with minerals  1 tablet Oral Daily   pantoprazole   40 mg Oral Daily   sodium chloride  flush  3 mL Intravenous Q12H    Continuous Infusions:       LOS: 6 days     Trenda Mar, MD,  FACP, [facility], [facility], [facility]   [facility] [place]   To contact the attending provider between 7A-7P or the covering provider during after hours 7P-7A, please log into the web site [email or web address] and access using universal [place] password for that web site. If you do not have the password, please call the hospital operator.  07/05/2024, 10:33 AM   "

## 2024-07-06 ENCOUNTER — Inpatient Hospital Stay (HOSPITAL_COMMUNITY)

## 2024-07-06 DIAGNOSIS — K56609 Unspecified intestinal obstruction, unspecified as to partial versus complete obstruction: Secondary | ICD-10-CM | POA: Diagnosis not present

## 2024-07-06 LAB — BASIC METABOLIC PANEL WITH GFR
Anion gap: 8 (ref 5–15)
BUN: 14 mg/dL (ref 8–23)
CO2: 26 mmol/L (ref 22–32)
Calcium: 8.5 mg/dL — ABNORMAL LOW (ref 8.9–10.3)
Chloride: 105 mmol/L (ref 98–111)
Creatinine, Ser: 0.55 mg/dL (ref 0.44–1.00)
GFR, Estimated: >60 mL/min
Glucose, Bld: 90 mg/dL (ref 70–99)
Potassium: 3.6 mmol/L (ref 3.5–5.1)
Sodium: 139 mmol/L (ref 135–145)

## 2024-07-06 LAB — CBC
HCT: 31.9 % — ABNORMAL LOW (ref 36.0–46.0)
Hemoglobin: 10.5 g/dL — ABNORMAL LOW (ref 12.0–15.0)
MCH: 31.7 pg (ref 26.0–34.0)
MCHC: 32.9 g/dL (ref 30.0–36.0)
MCV: 96.4 fL (ref 80.0–100.0)
Platelets: 270 K/uL (ref 150–400)
RBC: 3.31 MIL/uL — ABNORMAL LOW (ref 3.87–5.11)
RDW: 14.1 % (ref 11.5–15.5)
WBC: 8.7 K/uL (ref 4.0–10.5)
nRBC: 0 % (ref 0.0–0.2)

## 2024-07-06 LAB — MAGNESIUM: Magnesium: 1.8 mg/dL (ref 1.7–2.4)

## 2024-07-06 NOTE — Progress Notes (Signed)
 " PROGRESS NOTE   [name]  FMW:[id number]    DOB: 1955-12-09    DOA: 06/29/2024  PCP: [name] LABOR, MD   I have briefly reviewed patients previous medical records in [facility]   Brief Hospital Course:   69 year old female, recently lived alone, has a caregiver assistance at home, hospitalized 12/22 - 12/26 for closed right acetabular fracture and discharged to SNF, with PMH paroxysmal A-flutter s/p ablation, HTN, CVA, chronic systolic CHF, severe rheumatoid arthritis, gout, bilateral blindness, has right hearing aid, perforated diverticulitis 2014 s/p sigmoid colectomy with ostomy in place, multiple bowel obstructions, right acetabular fracture last month who presented with nausea, vomiting, abdominal pain.   CT A/P on admission showed dilated proximal and mid small bowel with a short transitional segment noted in the central abdomen from presumed underlying adhesions causing bowel obstruction.  Known large ventral hernia at the umbilical level felt to possibly be closed-loop obstruction on CT but after evaluation by surgery, not felt to be consistent with that.   General surgery consulted, managed conservatively with bowel rest, NG tube with improvement, NG tube removed 1/6.  S/p SB protocol 1/8.  SBO resolved.  CCS signed off.   Assessment & Plan:   SBO (small bowel obstruction) (HCC) - History of bowel obstructions and sigmoid colectomy.  Ostomy in place with minimal output prior to admission -CT showing SBO concerning for underlying adhesions -Some concern on CT for closed-loop obstruction involving ventral hernia however not felt to represent this after evaluation by surgery General Surgery was consulted.  Treated conservatively with bowel rest, NG tube, IVF with improvement.  NG tube was removed on 1/6.  Diet was advanced to soft diet and General Surgery signed off 1/7. However on 1/8, patient reported recurrent abdominal fullness without significant any stool in colostomy  for approximately 48 hours prior. KUB showed nonspecific bowel gas pattern with diffuse gas-filled small bowel loops and colon. Component of underlying ileus not excluded.  Reengaged CCS, s/p SB protocol 1/8 followed by good stool output through colostomy.  Follow-up KUB shows no evidence of bowel obstruction.  General surgery signed off. Since then, no significant GI symptoms but did not have much of colostomy output for 2 days.  KUB this morning unremarkable as below.  Now having colostomy output.   Paroxysmal atrial flutter (HCC) - Continue p.o. metoprolol . - Not on anticoagulation; history of ablation but A-fib recurred   Closed right acetabular fracture (HCC) - known on CT: Recent nondisplaced fracture of the posterior column of the right acetabulum.  - Hospitalized 12/22 - 12/26 after a fall resulting in right acetabular fracture.  Recommended for 50% WB on right side with ambulation and WBAT for transfers - outpt follow upwith ortho 2-3 weeks post fall - PT and OT evaluation.  PT recommends SNF.  TOC on board.  TOC had to resubmit for insurance authorization for SNF.   Rheumatoid arthritis (HCC) - Severe contractures and end-stage appearing deformities noted in bilateral upper extremities/especially hands and fingers - Received weekly dose of abatacept  SQ and methotrexate  subcu on 1/8. - Sees Dr. Jon Jacob, Rheumatology.   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  Anemia of chronic disease Stable compared to recent.    Body mass index is 21.65 kg/m.   DVT prophylaxis:  SCDs Start: 06/29/24 2340     Code Status: Full Code:  Family Communication: None at bedside  Disposition:  Medically optimized for DC to SNF pending insurance authorization.     Consultants:    General surgery  Procedures:   NG tube-removed 1/6.  Subjective:  Denies complaints.  No abdominal pain or distention.  Good appetite.  Ate 100% of breakfast.  Objective:   Vitals:   07/06/24 0500 07/06/24 0626 07/06/24 0844 07/06/24 1006  BP:  (!) 152/76 120/72 136/76  Pulse:  80 76 77  Resp:  17  17  Temp:  97.6 F (36.4 C)    TempSrc:      SpO2:  100%  93%  Weight: 64.6 kg     Height:        General exam: Middle-age female, looks older than stated age, small built, frail and thinly nourished, chronically ill looking lying comfortably propped up in bed without distress.  Has right sided hearing aid. Respiratory system: Clear to auscultation. Respiratory effort normal. Cardiovascular system: S1 & S2 heard, RRR. No JVD, murmurs, rubs, gallops or clicks. No pedal edema. Gastrointestinal system: Abdomen is nondistended, soft and nontender. No organomegaly or masses felt. Normal bowel sounds heard.  Left-sided colostomy with soft yellow stool.  50 mL emptied last night. Central nervous system: Alert and oriented. No focal neurological deficits. Extremities: Symmetric 5 x 5 power.  Chronic and end-stage appearing deformities of both hands and fingers, likely related to rheumatoid arthritis.  Right corneal opacity.  Blind in both eyes. Skin: No rashes, lesions or ulcers Psychiatry: Judgement and insight appear normal. Mood & affect appropriate.     Data Reviewed:   I have personally reviewed following labs and imaging studies   CBC: Recent Labs  Lab 06/29/24 2010 06/29/24 2025 07/01/24 0407 07/03/24 0625 07/06/24 0541  WBC 7.8  --  4.8 7.2 8.7  NEUTROABS 6.4  --  2.6  --   --   HGB 14.2   < > 10.9* 10.1* 10.5*  HCT 44.5   < > 34.5* 30.8* 31.9*  MCV 96.9  --  98.6 96.9 96.4  PLT 418*  --  296 253 270   < > = values in this interval not displayed.    Basic Metabolic Panel: Recent Labs  Lab 06/29/24 2010 06/29/24 2025 06/30/24 0426 07/01/24 0407 07/03/24 0625  07/06/24 0541  NA 143 141  --  140 143 139  K 4.7 4.5  --  3.7 3.6 3.6  CL 99 102  --  104 109 105  CO2 27  --   --  27 26 26   GLUCOSE 136* 134*  --  94 88 90  BUN 32* 33*  --  25* 16 14  CREATININE 0.93 1.00  --  0.65 0.54 0.55  CALCIUM  11.2*  --   --  8.8* 8.1* 8.5*  MG  --   --  1.7 1.8  --  1.8    Liver Function Tests: Recent Labs  Lab 06/29/24 2010 07/01/24 0407  AST 47* 38  ALT 38 36  ALKPHOS 151* 105  BILITOT 0.8 0.6  PROT 8.3* 6.4*  ALBUMIN 4.5 3.7    CBG: No results for input(s): GLUCAP in the last 168 hours.  Microbiology Studies:  No results found for this or any previous visit (from the past 240 hours).  Radiology Studies:  DG Abd Portable 1V Result Date: 07/06/2024 EXAM: 1 VIEW XRAY OF THE ABDOMEN 07/06/2024 06:14:00 AM COMPARISON: 07/04/2024  CLINICAL HISTORY: Ileus (HCC) FINDINGS: BOWEL: Antegrade progression of enteric contrast material is now seen within the decompressed descending colon. No dilated bowel loops identified. SOFT TISSUES: Aortic atherosclerosis. BONES: Severe levoscoliosis of lumbar spine with multilevel spondylitic change and degenerative changes in bilateral hips. No acute fracture. IMPRESSION: 1. Antegrade progression of enteric contrast material within the decompressed descending colon, consistent with resolving ileus. No dilated bowel loops identified. Electronically signed by: Waddell Calk MD MD 07/06/2024 06:38 AM EST RP Workstation: [id number]    Scheduled Meds:    allopurinol   100 mg Oral Daily   aspirin  EC  81 mg Oral Daily   cholecalciferol   2,000 Units Oral Daily   docusate sodium   100 mg Oral BID   folic acid   500 mcg Oral Daily   loratadine   10 mg Oral Daily   metoprolol  succinate  25 mg Oral Daily   multivitamin with minerals  1 tablet Oral Daily   pantoprazole   40 mg Oral Daily   polyethylene glycol  17 g Oral BID   sodium chloride  flush  3 mL Intravenous Q12H    Continuous Infusions:       LOS: 7 days      Trenda Mar, MD,  FACP, [facility], [facility], [facility]   [facility] [place]   To contact the attending provider between 7A-7P or the covering provider during after hours 7P-7A, please log into the web site [email or web address] and access using universal [place] password for that web site. If you do not have the password, please call the hospital operator.  07/06/2024, 4:37 PM   "

## 2024-07-06 NOTE — TOC Progression Note (Addendum)
 Transition of Care [facility]) - Progression Note    Patient Details  Name: [name] MRN: [id number] Date of Birth: August 21, 1955  Transition of [facility]) CM/SW Contact  Christipher Rieger LITTIE Moose, [place] Phone Number: 07/06/2024, 3:31 PM  Clinical Narrative:    CSW received a phone call from [facility], pt shara has been denieddue to acute course not yet complete. [employer] informed CSW that auth could be resubmitted. CSW resubmitted auth, [id number] and will continue to follow.       Barriers to Discharge: Continued Medical Work up               Expected Discharge Plan and Services       Living arrangements for the past 2 months: Apartment                                       Social Drivers of Health (SDOH) Interventions SDOH Screenings   Food Insecurity: No Food Insecurity (06/30/2024)  Housing: Low Risk (06/30/2024)  Transportation Needs: No Transportation Needs (06/30/2024)  Utilities: Not At Risk (06/30/2024)  Depression (PHQ2-9): Low Risk (10/01/2023)  Financial Resource Strain: Low Risk (10/16/2021)  Physical Activity: Inactive (10/16/2021)  Social Connections: Moderately Isolated (06/30/2024)  Stress: Stress Concern Present (10/16/2021)  Tobacco Use: Medium Risk (06/29/2024)    Readmission Risk Interventions    06/19/2024   12:56 PM  Readmission Risk Prevention Plan  Post Dischage Appt Complete  Medication Screening Complete  Transportation Screening Complete

## 2024-07-06 NOTE — Progress Notes (Signed)
 Physical Therapy Treatment Patient Details Name: [name] MRN: [id number] DOB: November 29, 1955 Today's Date: 07/06/2024   History of Present Illness Pt is 69 yo presenting to [facility] On 1/[facility] due to nausea/vomiting and abdominal pain from rehab. Recent hospitalization 12/22 with non-operative acetabular fracutre.  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 PT returned 30 min later to assist pt back to bed. Pt with increased difficulty getting up from recliner due to lower surface height requiring maxA to power up and stand up to RW. Pt remains appropriate to return to SNF to continue rehab. Acute PT to cont to follow.    If plan is discharge home, recommend the following: Assist for transportation;Help with stairs or ramp for entrance;A little help with walking and/or transfers;A little help with bathing/dressing/bathroom   Can travel by private vehicle     No  Equipment Recommendations  None recommended by PT    Recommendations for Other Services       Precautions / Restrictions Precautions Precautions: Fall Recall of Precautions/Restrictions: Impaired Precaution/Restrictions Comments: blind Restrictions Weight Bearing Restrictions Per Provider Order: Yes RLE Weight Bearing Per Provider Order: Partial weight bearing RLE Partial Weight Bearing Percentage or Pounds: RLE WBAT for transfers, 50% WB for ambulation Other Position/Activity Restrictions:  (50% weightbearing with ambulation. WBAT for transfers)     Mobility  Bed Mobility Overal bed mobility: Needs Assistance Bed Mobility: Supine to Sit, Sit to Supine     Supine to sit: Min assist Sit to supine: Mod assist   General bed mobility comments: min A with LE mgt and to elevate trunk, modA for LE elevation back into bed     Transfers Overall transfer level: Needs assistance Equipment used: Rolling walker (2 wheels) Transfers: Sit to/from Stand, Bed to chair/wheelchair/BSC Sit to Stand: Max assist   Step pivot transfers: Min assist       General transfer comment: bed elevated and pt able to stand with minA however from recliner pt required maxA to power up to RW, pt able to complete steps to/from chair when going to the L. Pt reports putting weight through R LE but unsure how much.    Ambulation/Gait               General Gait Details: limited by R hip pain   Stairs             Wheelchair Mobility     Tilt Bed    Modified Rankin (Stroke Patients Only)       Balance Overall balance assessment: Needs assistance Sitting-balance support: Feet supported, Bilateral upper extremity supported Sitting balance-Leahy Scale: Fair     Standing balance support: Bilateral upper extremity supported Standing balance-Leahy Scale: Poor Standing balance comment: reliant on RW and external assist                            Communication Communication Communication: No apparent difficulties  Cognition Arousal: Alert Behavior During Therapy: WFL for tasks assessed/performed   PT - Cognitive impairments: No apparent impairments                         Following commands: Intact      Cueing Cueing Techniques: Verbal cues, Tactile cues  Exercises      General Comments General comments (skin integrity, edema, etc.): VSS      Pertinent Vitals/Pain Pain Assessment Pain Assessment: Faces Faces Pain Scale: Hurts little more Pain Location: R hip Pain Descriptors / Indicators: Aching, Discomfort Pain Intervention(s): Limited activity within patient's tolerance    Home Living                          Prior Function            PT Goals (current goals can now be found in the care plan section) Acute Rehab PT Goals Patient Stated Goal: return to  rehab to get stronger and go home. PT Goal Formulation: With patient Time For Goal Achievement: 07/15/24 Potential to Achieve Goals: Good Progress towards PT goals: Progressing toward goals    Frequency    Min 2X/week      PT Plan      Co-evaluation              AM-PAC PT 6 Clicks Mobility   Outcome Measure  Help needed turning from your back to your side while in a flat bed without using bedrails?: A Little Help needed moving from lying on your back to sitting on the side of a flat bed without using bedrails?: A Little Help needed moving to and from a bed to a chair (including a wheelchair)?: A Lot Help needed standing up from a chair using your arms (e.g., wheelchair or bedside chair)?: A Lot Help needed to walk in hospital room?: Total Help needed climbing 3-5 steps with a railing? : Total 6 Click Score: 12    End of Session Equipment Utilized During Treatment: Gait belt Activity Tolerance: Patient tolerated treatment well Patient left: in bed;with call bell/phone within reach;with bed alarm set Nurse Communication: Mobility status PT Visit Diagnosis: Unsteadiness on feet (R26.81);Muscle weakness (generalized) (M62.81);Difficulty in walking, not elsewhere classified (R26.2);Pain Pain - Right/Left: Right Pain - part of body: Hip     Time: 8852-8785 PT Time Calculation (min) (ACUTE ONLY): 27 min  Charges:    $Therapeutic Activity: 23-37 mins PT General Charges $$ ACUTE PT VISIT: 1 Visit                     Norene Ames, PT, DPT Acute Rehabilitation Services Secure chat preferred Office #: [phone]    Norene CHRISTELLA Ames 07/06/2024, 2:28 PM

## 2024-07-07 DIAGNOSIS — K56609 Unspecified intestinal obstruction, unspecified as to partial versus complete obstruction: Secondary | ICD-10-CM | POA: Diagnosis not present

## 2024-07-07 NOTE — Progress Notes (Signed)
 Physical Therapy Treatment Patient Details Name: [name] MRN: [id number] DOB: [birth year] Today's Date: 07/07/2024   History of Present Illness Pt is 69 yo presenting to [facility] On 1/[facility] due to nausea/vomiting and abdominal pain from rehab. Recent hospitalization 12/22 with non-operative acetabular fracutre.  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 The pt was able to complete sit-stand transfer with minA of 2 and increased time to rise from elevated bed. She was then able to complete x2 short bouts of 6 ft ambulation with RW and up to modA. Pt reports good ability to maintain 50% wt bearing on RLE during gait, does benefit from seated rest when fatigued. Recommendations for return to rehab <3hours/day remain appropriate, will continue to follow acutely.    If plan is discharge home, recommend the following: Assist for transportation;Help with stairs or ramp for entrance;A little help with walking and/or transfers;A little help with bathing/dressing/bathroom   Can travel by private vehicle     No  Equipment Recommendations  None recommended by PT    Recommendations for Other Services       Precautions / Restrictions Precautions Precautions: Fall Recall of Precautions/Restrictions: Impaired Precaution/Restrictions Comments: blind Restrictions Weight Bearing Restrictions Per Provider Order: Yes RLE Weight Bearing Per Provider Order: Partial weight bearing RLE Partial Weight Bearing Percentage or Pounds: WBAT Other Position/Activity Restrictions: WBAT for transfers, 50% WB for ambulation     Mobility  Bed Mobility Overal bed mobility: Needs Assistance Bed Mobility: Supine to Sit, Sit to Supine     Supine to sit: Min assist Sit to supine: Mod assist   General bed mobility comments: min assist to scoot hips out,  mod assist for LB elevation backto bed    Transfers Overall transfer level: Needs assistance Equipment used: Rolling walker (2 wheels) Transfers: Sit to/from Stand Sit to Stand: Min assist, Mod assist, +2 physical assistance           General transfer comment: min assist +2 from elevated EOB and mod assist +2 from recliner; cueing for hand placement and increased time to power up.    Ambulation/Gait Ambulation/Gait assistance: Mod assist, +2 physical assistance, +2 safety/equipment Gait Distance (Feet): 6 Feet (+6 ft) Assistive device: Rolling walker (2 wheels) Gait Pattern/deviations: Step-to pattern Gait velocity: decreased     General Gait Details: step to with limited wt on RLE, able to hod RW without special grips. completed 6 ft + seated rest + 6 ft      Balance Overall balance assessment: Needs assistance Sitting-balance support: Feet supported Sitting balance-Leahy Scale: Fair Sitting balance - Comments: engaging in ADLs with supervision   Standing balance support: Bilateral upper extremity supported, During functional activity Standing balance-Leahy Scale: Poor Standing balance comment: reliant on RW and external assist                            Communication Communication Communication: No apparent difficulties  Cognition Arousal: Alert Behavior During Therapy: WFL for tasks assessed/performed   PT - Cognitive impairments: No apparent impairments                         Following commands: Intact      Cueing Cueing Techniques: Verbal cues, Tactile cues  Exercises      General  Comments General comments (skin integrity, edema, etc.): VSS on RA      Pertinent Vitals/Pain Pain Assessment Pain Assessment: Faces Faces Pain Scale: Hurts a little bit Pain Location: R hip Pain Descriptors / Indicators: Aching, Discomfort Pain Intervention(s): Limited activity within patient's tolerance, Monitored during session, Repositioned      PT Goals (current goals can now be found in the care plan section) Acute Rehab PT Goals Patient Stated Goal: return to rehab to get stronger and go home. PT Goal Formulation: With patient Time For Goal Achievement: 07/15/24 Potential to Achieve Goals: Good Progress towards PT goals: Progressing toward goals    Frequency    Min 2X/week      PT Plan      Co-evaluation   Reason for Co-Treatment: Complexity of the patient's impairments (multi-system involvement);For patient/therapist safety;To address functional/ADL transfers PT goals addressed during session: Mobility/safety with mobility;Balance;Proper use of DME OT goals addressed during session: ADL's and self-care      AM-PAC PT 6 Clicks Mobility   Outcome Measure  Help needed turning from your back to your side while in a flat bed without using bedrails?: A Little Help needed moving from lying on your back to sitting on the side of a flat bed without using bedrails?: A Little Help needed moving to and from a bed to a chair (including a wheelchair)?: A Lot Help needed standing up from a chair using your arms (e.g., wheelchair or bedside chair)?: A Lot Help needed to walk in hospital room?: Total Help needed climbing 3-5 steps with a railing? : Total 6 Click Score: 12    End of Session Equipment Utilized During Treatment: Gait belt Activity Tolerance: Patient tolerated treatment well Patient left: in bed;with call bell/phone within reach;with bed alarm set Nurse Communication: Mobility status PT Visit Diagnosis: Unsteadiness on feet (R26.81);Muscle weakness (generalized) (M62.81);Difficulty in walking, not elsewhere classified (R26.2);Pain Pain - Right/Left: Right Pain - part of body: Hip     Time: 1218-1238 PT Time Calculation (min) (ACUTE ONLY): 20 min  Charges:    $Gait Training: 8-22 mins PT General Charges $$ ACUTE PT VISIT: 1 Visit                     Izetta Call, PT, DPT   Acute  Rehabilitation Department Office [phone] Secure Chat Communication Preferred   Izetta JULIANNA Call 07/07/2024, 1:31 PM

## 2024-07-07 NOTE — Progress Notes (Signed)
 Occupational Therapy Treatment Patient Details Name: [name] MRN: [id number] DOB: 13-Jan-1956 Today's Date: 07/07/2024   History of present illness Pt is 69 yo presenting to [facility] On 1/5 due to nausea/vomiting and abdominal pain from rehab. Recent hospitalization 12/22 with non-operative acetabular fracutre.  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  Patient needing min assist for bed mobility to scoot to EOB, transfers with min assist +2 safety from elevated EOB and mod assist +2 from recliner.  Using RW to mobilize with +2 for safety- see PT note for details.  Sitting EOB at end of session, pt completing grooming with setup assist- reports increased ability to use hands when sitting upright due to RA.  Patient thankful to be able to engage in grooming and UB dressing at EOB.  Continue to recommend <3 hrs/day inpatient setting at dc.  Will follow acutely.       If plan is discharge home, recommend the following:  A lot of help with walking and/or transfers;A lot of help with bathing/dressing/bathroom;Assistance with cooking/housework;Assist for transportation;Help with stairs or ramp for entrance;Direct supervision/assist for medications management   Equipment Recommendations  Other (comment) (defer)    Recommendations for Other Services      Precautions / Restrictions Precautions Precautions: Fall Recall of Precautions/Restrictions: Impaired Precaution/Restrictions Comments: blind Restrictions Weight Bearing Restrictions Per Provider Order: Yes RLE Weight Bearing Per Provider Order: Partial weight bearing RLE Partial Weight Bearing Percentage or Pounds: WBAT Other Position/Activity Restrictions: WBAT for transfers, 50% WB for ambulation       Mobility Bed Mobility Overal bed mobility: Needs Assistance Bed Mobility: Supine to Sit,  Sit to Supine     Supine to sit: Min assist Sit to supine: Mod assist   General bed mobility comments: min assist to scoot hips out, mod assist for LB elevation backto bed    Transfers Overall transfer level: Needs assistance Equipment used: Rolling walker (2 wheels) Transfers: Sit to/from Stand Sit to Stand: Min assist, Mod assist, +2 physical assistance           General transfer comment: min assist +2 from elevated EOB and mod assist +2 from recliner; cueing for hand placement and increased time to power up.     Balance Overall balance assessment: Needs assistance Sitting-balance support: Feet supported Sitting balance-Leahy Scale: Fair Sitting balance - Comments: engaging in ADLs with supervision   Standing balance support: Bilateral upper extremity supported, During functional activity Standing balance-Leahy Scale: Poor Standing balance comment: reliant on RW and external assist                           ADL either performed or assessed with clinical judgement   ADL Overall ADL's : Needs assistance/impaired     Grooming: Set up;Wash/dry hands;Wash/dry face;Oral care;Sitting           Upper Body Dressing : Minimal assistance;Sitting   Lower Body Dressing: Maximal assistance;Sit to/from stand   Toilet Transfer: Minimal assistance;Moderate assistance;+2 for physical assistance;Ambulation;Rolling walker (2 wheels) Toilet Transfer Details (indicate cue type and reason): simulated in room, increased assist to stand from recliner         Functional mobility during ADLs: Minimal assistance;Moderate assistance;+2 for physical assistance;+2 for safety/equipment      Extremity/Trunk Assessment  Vision       Perception     Praxis     Communication Communication Communication: No apparent difficulties   Cognition Arousal: Alert Behavior During Therapy: WFL for tasks assessed/performed Cognition: No apparent impairments              OT - Cognition Comments: pt following commands and engaging appropriately.                 Following commands: Intact        Cueing   Cueing Techniques: Verbal cues, Tactile cues  Exercises      Shoulder Instructions       General Comments VSS    Pertinent Vitals/ Pain       Pain Assessment Pain Assessment: Faces Faces Pain Scale: Hurts a little bit Pain Location: R hip Pain Descriptors / Indicators: Aching, Discomfort Pain Intervention(s): Limited activity within patient's tolerance, Monitored during session, Repositioned  Home Living                                          Prior Functioning/Environment              Frequency  Min 2X/week        Progress Toward Goals  OT Goals(current goals can now be found in the care plan section)  Progress towards OT goals: Progressing toward goals  Acute Rehab OT Goals Patient Stated Goal: get better OT Goal Formulation: With patient Time For Goal Achievement: 07/16/24 Potential to Achieve Goals: Good  Plan      Co-evaluation    PT/OT/SLP Co-Evaluation/Treatment: Yes Reason for Co-Treatment: Complexity of the patient's impairments (multi-system involvement);For patient/therapist safety;To address functional/ADL transfers PT goals addressed during session: Mobility/safety with mobility OT goals addressed during session: ADL's and self-care      AM-PAC OT 6 Clicks Daily Activity     Outcome Measure   Help from another person eating meals?: A Little Help from another person taking care of personal grooming?: A Little Help from another person toileting, which includes using toliet, bedpan, or urinal?: A Lot Help from another person bathing (including washing, rinsing, drying)?: A Lot Help from another person to put on and taking off regular upper body clothing?: A Little Help from another person to put on and taking off regular lower body clothing?: A Lot 6 Click Score:  15    End of Session Equipment Utilized During Treatment: Rolling walker (2 wheels)  OT Visit Diagnosis: Unsteadiness on feet (R26.81);Other abnormalities of gait and mobility (R26.89);Muscle weakness (generalized) (M62.81);History of falling (Z91.81);Low vision, both eyes (H54.2);Feeding difficulties (R63.3);Pain Pain - Right/Left: Right Pain - part of body: Hip;Leg   Activity Tolerance Patient tolerated treatment well   Patient Left in bed;with call bell/phone within reach;with bed alarm set   Nurse Communication Mobility status        Time: 8787-8756 OT Time Calculation (min): 31 min  Charges: OT General Charges $OT Visit: 1 Visit OT Treatments $Self Care/Home Management : 8-22 mins  Etta NOVAK, OT Acute Rehabilitation Services Office [phone] Secure Chat Preferred    Etta GORMAN Hope 07/07/2024, 1:14 PM

## 2024-07-07 NOTE — Progress Notes (Signed)
" °   07/07/24 1050  Assess: MEWS Score  Temp 98.8 F (37.1 C)  BP 135/71  MAP (mmHg) 90  Pulse Rate (!) 112  Resp 15  Level of Consciousness Alert  SpO2 96 %  O2 Device Room Air  Assess: MEWS Score  MEWS Temp 0  MEWS Systolic 0  MEWS Pulse 2  MEWS RR 0  MEWS LOC 0  MEWS Score 2  MEWS Score Color Yellow  Assess: if the MEWS score is Yellow or Red  Were vital signs accurate and taken at a resting state? Yes  Does the patient meet 2 or more of the SIRS criteria? No  MEWS guidelines implemented  Yes, yellow  Treat  MEWS Interventions Considered administering scheduled or prn medications/treatments as ordered  Take Vital Signs  Increase Vital Sign Frequency  Yellow: Q2hr x1, continue Q4hrs until patient remains green for 12hrs  Escalate  MEWS: Escalate Yellow: Discuss with charge nurse and consider notifying provider and/or RRT  Notify: Charge Nurse/RN  Name of Charge Nurse/RN Notified [facility]  Provider Notification  Provider Name/Title Hongalgi,MD  Date Provider Notified 07/07/24  Time Provider Notified 1052  Method of Notification Page  Notification Reason Other (Comment) (Yellow MEWS)  Provider response No new orders  Date of Provider Response 07/07/24  Time of Provider Response 1055  Assess: SIRS CRITERIA  SIRS Temperature  0  SIRS Respirations  0  SIRS Pulse 1  SIRS WBC 0  SIRS Score Sum  1    "

## 2024-07-07 NOTE — Progress Notes (Signed)
 " PROGRESS NOTE   [name]  FMW:[id number]    DOB: 1955/07/06    DOA: 06/29/2024  PCP: [name] LABOR, MD   I have briefly reviewed patients previous medical records in [facility].   Brief Hospital Course:   69 year old female, recently lived alone, has a caregiver assistance at home, hospitalized 12/22 - 12/26 for closed right acetabular fracture and discharged to SNF, with PMH paroxysmal A-flutter s/p ablation, HTN, CVA, chronic systolic CHF, severe rheumatoid arthritis, gout, bilateral blindness, has right hearing aid, perforated diverticulitis 2014 s/p sigmoid colectomy with ostomy in place, multiple bowel obstructions, right acetabular fracture last month who presented with nausea, vomiting, abdominal pain.   CT A/P on admission showed dilated proximal and mid small bowel with a short transitional segment noted in the central abdomen from presumed underlying adhesions causing bowel obstruction.  Known large ventral hernia at the umbilical level felt to possibly be closed-loop obstruction on CT but after evaluation by surgery, not felt to be consistent with that.   General surgery consulted, managed conservatively with bowel rest, NG tube with improvement, NG tube removed 1/6.  S/p SB protocol 1/8.  SBO resolved.  CCS signed off.  Now has insurance approval and SNF able to except on 1/14.   Assessment & Plan:   SBO (small bowel obstruction) (HCC) - History of bowel obstructions and sigmoid colectomy.  Ostomy in place with minimal output prior to admission -CT showing SBO concerning for underlying adhesions -Some concern on CT for closed-loop obstruction involving ventral hernia however not felt to represent this after evaluation by surgery General Surgery was consulted.  Treated conservatively with bowel rest, NG tube, IVF with improvement.  NG tube was removed on 1/6.  Diet was advanced to soft diet and General Surgery signed off 1/7. However on 1/8, patient reported recurrent  abdominal fullness without significant any stool in colostomy for approximately 48 hours prior. KUB showed nonspecific bowel gas pattern with diffuse gas-filled small bowel loops and colon. Component of underlying ileus not excluded.  Reengaged CCS, s/p SB protocol 1/8 followed by good stool output through colostomy.  Follow-up KUB shows no evidence of bowel obstruction.  General surgery signed off. Since then, no significant GI symptoms but did not have much of colostomy output for 2 days.  KUB this unremarkable as below.  Now having colostomy output.  SBO resolved.   Paroxysmal atrial flutter (HCC) - Continue p.o. metoprolol . - Not on anticoagulation; history of ablation but A-fib recurred   Closed right acetabular fracture (HCC) - known on CT: Recent nondisplaced fracture of the posterior column of the right acetabulum.  - Hospitalized 12/22 - 12/26 after a fall resulting in right acetabular fracture.  Recommended for 50% WB on right side with ambulation and WBAT for transfers - outpt follow upwith ortho 2-3 weeks post fall - PT and OT evaluation.  PT recommends SNF.  TOC on board.  TOC had to resubmit for insurance authorization for SNF.   Rheumatoid arthritis (HCC) - Severe contractures and end-stage appearing deformities noted in bilateral upper extremities/especially hands and fingers - Received weekly dose of abatacept  SQ and methotrexate  subcu on 1/8. - Sees Dr. Jon Jacob, Rheumatology.   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  Anemia of chronic disease Stable compared to  recent.    Body mass index is 20.51 kg/m.   DVT prophylaxis: SCDs Start: 06/29/24 2340     Code Status: Full Code:  Family Communication: None at bedside  Disposition:  Medically optimized  for DC to [facility] 1/14     Consultants:   General surgery  Procedures:   NG tube-removed 1/6.  Subjective:  No complaints.  Wanting to know when she is going to SNF because worried that she will lose her SNF bed.  Objective:   Vitals:   07/07/24 0500 07/07/24 0603 07/07/24 0831 07/07/24 1050  BP:  (!) 143/74 (!) 143/74 135/71  Pulse:  66 66 (!) 112  Resp:  16  15  Temp:  98.6 F (37 C)  98.8 F (37.1 C)  TempSrc:  Oral  Oral  SpO2:  98%  96%  Weight: 61.2 kg     Height:        General exam: Middle-age female, looks older than stated age, small built, frail and thinly nourished, chronically ill looking lying comfortably propped up in bed without distress.  Has right sided hearing aid. Respiratory system: Clear to auscultation. Respiratory effort normal. Cardiovascular system: S1 & S2 heard, RRR. No JVD, murmurs, rubs, gallops or clicks. No pedal edema. Gastrointestinal system: Abdomen is nondistended, soft and nontender. No organomegaly or masses felt. Normal bowel sounds heard.  Left-sided colostomy with soft yellow stools. Central nervous system: Alert and oriented. No focal neurological deficits. Extremities: Symmetric 5 x 5 power.  Chronic and end-stage appearing deformities of both hands and fingers, likely related to rheumatoid arthritis.  Right corneal opacity.  Blind in both eyes. Skin: No rashes, lesions or ulcers Psychiatry: Judgement and insight appear normal. Mood & affect appropriate.     Data Reviewed:   I have personally reviewed following labs and imaging studies   CBC: Recent Labs  Lab 07/01/24 0407 07/03/24 0625 07/06/24 0541  WBC 4.8 7.2 8.7  NEUTROABS 2.6  --   --   HGB 10.9* 10.1* 10.5*  HCT 34.5* 30.8* 31.9*  MCV 98.6 96.9 96.4  PLT 296 253 270    Basic Metabolic Panel: Recent Labs  Lab 07/01/24 0407 07/03/24 0625 07/06/24 0541  NA 140 143 139  K 3.7 3.6 3.6  CL 104 109 105  CO2 27 26 26   GLUCOSE 94 88 90  BUN 25* 16 14   CREATININE 0.65 0.54 0.55  CALCIUM  8.8* 8.1* 8.5*  MG 1.8  --  1.8    Liver Function Tests: Recent Labs  Lab 07/01/24 0407  AST 38  ALT 36  ALKPHOS 105  BILITOT 0.6  PROT 6.4*  ALBUMIN 3.7    CBG: No results for input(s): GLUCAP in the last 168 hours.  Microbiology Studies:  No results found for this or any previous visit (from the past 240 hours).  Radiology Studies:  DG Abd Portable 1V Result Date: 07/06/2024 EXAM: 1 VIEW XRAY OF THE ABDOMEN 07/06/2024 06:14:00 AM COMPARISON: 07/04/2024 CLINICAL HISTORY: Ileus (HCC) FINDINGS: BOWEL: Antegrade progression of enteric contrast material is now seen within the decompressed descending colon. No dilated bowel loops identified. SOFT TISSUES: Aortic atherosclerosis. BONES: Severe levoscoliosis of lumbar spine with multilevel spondylitic change and degenerative changes in bilateral hips. No acute fracture. IMPRESSION: 1. Antegrade progression of enteric contrast material within the decompressed descending colon, consistent with resolving ileus. No dilated bowel loops identified. Electronically signed by: Waddell Calk MD MD 07/06/2024 06:38 AM EST RP Workstation: [id number]    Scheduled Meds:  allopurinol   100 mg Oral Daily   aspirin  EC  81 mg Oral Daily   cholecalciferol   2,000 Units Oral Daily   docusate sodium   100 mg Oral BID   folic acid   500 mcg Oral Daily   loratadine   10 mg Oral Daily   metoprolol  succinate  25 mg Oral Daily   multivitamin with minerals  1 tablet Oral Daily   pantoprazole   40 mg Oral Daily   polyethylene glycol  17 g Oral BID   sodium chloride  flush  3 mL Intravenous Q12H    Continuous Infusions:       LOS: 8 days     Trenda Mar, MD,  FACP, [facility], [facility], [facility]   [facility] [place]   To contact the attending provider between 7A-7P or the covering provider during after hours 7P-7A, please log into the web site [email or web address] and access using  universal [place] password for that web site. If you do not have the password, please call the hospital operator.  07/07/2024, 4:40 PM   "

## 2024-07-07 NOTE — TOC Progression Note (Addendum)
 Transition of Care [facility]) - Progression Note    Patient Details  Name: [name] MRN: [id number] Date of Birth: 1955-08-15  Transition of Care [facility]) CM/SW Contact  Elieser Tetrick LITTIE Moose, [place] Phone Number: 07/07/2024, 2:28 PM  Clinical Narrative:    CSW received insurance auth approval for [place]. Auth ID [id number], effective 1/13-1/15. CSW contacted Myrene with [facility] to confirm bed availability, facility will have bed available for pt tomorrow 1/14. CSW will continue to follow.    Barriers to Discharge: Continued Medical Work up               Expected Discharge Plan and Services       Living arrangements for the past 2 months: Apartment                                       Social Drivers of Health (SDOH) Interventions SDOH Screenings   Food Insecurity: No Food Insecurity (06/30/2024)  Housing: Low Risk (06/30/2024)  Transportation Needs: No Transportation Needs (06/30/2024)  Utilities: Not At Risk (06/30/2024)  Depression (PHQ2-9): Low Risk (10/01/2023)  Financial Resource Strain: Low Risk (10/16/2021)  Physical Activity: Inactive (10/16/2021)  Social Connections: Moderately Isolated (06/30/2024)  Stress: Stress Concern Present (10/16/2021)  Tobacco Use: Medium Risk (06/29/2024)    Readmission Risk Interventions    06/19/2024   12:56 PM  Readmission Risk Prevention Plan  Post Dischage Appt Complete  Medication Screening Complete  Transportation Screening Complete

## 2024-07-08 DIAGNOSIS — S32401D Unspecified fracture of right acetabulum, subsequent encounter for fracture with routine healing: Secondary | ICD-10-CM | POA: Diagnosis not present

## 2024-07-08 DIAGNOSIS — K56609 Unspecified intestinal obstruction, unspecified as to partial versus complete obstruction: Secondary | ICD-10-CM | POA: Diagnosis not present

## 2024-07-08 MED ORDER — HYDROCODONE-ACETAMINOPHEN 10-325 MG PO TABS: 1.0000 | ORAL_TABLET | Freq: Four times a day (QID) | ORAL | 0 refills | Status: AC | PRN

## 2024-07-08 NOTE — Discharge Summary (Signed)
 " Physician Discharge Summary   [name] FMW:[id number] DOB: 1955-12-16 DOA: 06/29/2024  PCP: [name] LABOR, MD  Admit date: 06/29/2024 Discharge date: 07/08/2024  Admitted From: Home Disposition:  SNF Discharging physician: Alm Apo, MD Barriers to discharge: none  Recommendations at discharge: Follow up with orthopedic surgery for repeat imaging    Discharge Condition: stable CODE STATUS: Full  Diet recommendation:  Diet Orders (From admission, onward)     Start     Ordered   07/01/24 1005  DIET DYS 3 Room service appropriate? Yes; Fluid consistency: Thin  Diet effective now       Question Answer Comment  Room service appropriate? Yes   Fluid consistency: Thin      07/01/24 1005            Hospital Course: [name] is a 69 year old female with PMH A-flutter s/p ablation, HTN, CVA, CHF, severe rheumatoid arthritis, bilateral blindness, perforated diverticulitis 2014 s/p sigmoid colectomy with ostomy in place, multiple bowel obstructions, right acetabular fracture last month who presented with nausea, vomiting, abdominal pain.  CT A/P on admission showed dilated proximal and mid small bowel with a short transitional segment noted in the central abdomen from presumed underlying adhesions causing bowel obstruction.  Known large ventral hernia at the umbilical level felt to possibly be closed-loop obstruction on CT but after evaluation by surgery, not felt to be consistent with that.  She was recommended for conservative management for now and placement of NG tube. General surgery consulted, managed conservatively with bowel rest, NG tube with improvement, NG tube removed 1/6.  S/p SB protocol 1/8.  SBO resolved.   Assessment and Plan: * SBO (small bowel obstruction) (HCC)-resolved as of 07/08/2024 - History of bowel obstructions and sigmoid colectomy.  Ostomy in place with minimal output prior to admission -CT showing SBO concerning for underlying adhesions -Some  concern on CT for closed-loop obstruction involving ventral hernia however not felt to represent this after evaluation by surgery General Surgery was consulted.  Treated conservatively with bowel rest, NG tube, IVF with improvement.  NG tube was removed on 1/6.  Diet was advanced to soft diet and General Surgery signed off 1/7. However on 1/8, patient reported recurrent abdominal fullness without significant any stool in colostomy for approximately 48 hours prior. KUB showed nonspecific bowel gas pattern with diffuse gas-filled small bowel loops and colon. Component of underlying ileus not excluded.  Reengaged CCS, s/p SB protocol 1/8 followed by good stool output through colostomy.  Follow-up KUB shows no evidence of bowel obstruction.  General surgery signed off. Since then, no significant GI symptoms but did not have much of colostomy output for 2 days.  KUB this unremarkable as below.  Now having colostomy output.  SBO resolved  Atrial flutter (HCC) - Continue Lopressor  - Not on anticoagulation; history of ablation but A-fib recurred  Closed right acetabular fracture (HCC) - known on CT: Recent nondisplaced fracture of the posterior column of the right acetabulum.  - Hospitalized 12/22 - 12/26 after a fall resulting in right acetabular fracture.  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   Closed  right acetabular fracture (HCC) 06/18/2024    Priority: 3.   Rheumatoid arthritis (HCC) 06/07/2009    Priority: 3.   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     Discharge Instructions     Increase activity slowly   Complete by: As directed       Allergies as of 07/08/2024       Reactions   Rosuvastatin  Diarrhea, Itching, Other (See Comments)   Severe myalgia    Spinach Itching, Rash, Other (See Comments)   Welts.        Medication List     STOP taking these medications    betamethasone  dipropionate 0.05 % lotion   clobetasol  ointment 0.05 % Commonly known as: TEMOVATE    lactulose 10 GM/15ML solution Commonly known as: CHRONULAC   mupirocin  ointment 2 % Commonly known as: BACTROBAN    traMADol  50 MG tablet Commonly known as: ULTRAM        TAKE these medications    Abatacept  125 MG/ML Sosy Inject 125 mg into the skin once a week. Thursday   albuterol  108 (90 Base) MCG/ACT inhaler Commonly known as: VENTOLIN  HFA INHALE 1 PUFF INTO THE LUNGS EVERY 4 HOURS AS NEEDED FOR WHEEZING OR SHORTNESS OF BREATH   allopurinol  100 MG tablet Commonly known as: ZYLOPRIM  TAKE 1 TABLET BY MOUTH EVERY DAY   ascorbic acid  500 MG tablet Commonly known as: VITAMIN C  Take 500 mg by mouth daily.   aspirin  EC 81 MG tablet Take 1 tablet (81 mg total) by mouth daily.   CALCIUM  PO Take 1 tablet by mouth 2 (two) times daily. 600 mg of calcium  with vitamin D    cetirizine  10 MG tablet Commonly known as: ZYRTEC  Take 1 tablet (10 mg total) by mouth daily.   cyanocobalamin  500 MCG tablet Commonly known as: VITAMIN B12 Take 500 mcg by mouth daily.   diclofenac sodium 1 % Gel Commonly known as: VOLTAREN Apply 1 application topically 4  (four) times daily as needed (arthritis).   docusate sodium  100 MG capsule Commonly known as: COLACE Take 100 mg by mouth 2 (two) times daily.   esomeprazole  40 MG capsule Commonly known as: NEXIUM  TAKE 1 CAPSULE BY MOUTH EVERY DAY AT NOON   folic acid  400 MCG tablet Commonly known as: FOLVITE  Take 400 mcg by mouth daily.   HYDROcodone -acetaminophen  10-325 MG tablet Commonly known as: NORCO Take 1 tablet by mouth every 6 (six) hours as needed for moderate pain (pain score 4-6) or severe pain (pain score 7-10).   hydrocortisone  2.5 % cream Apply 1 Application topically 2 (two) times daily as needed.   ketoconazole  2 % shampoo Commonly known as: NIZORAL  Apply 1 application topically 2 (two) times a week.   methotrexate  (PF) 50 MG/2ML injection Inject 0.5 mLs into the muscle once a week. Thursday   metoprolol  succinate 25 MG 24 hr tablet Commonly known as: TOPROL -XL Take 1 tablet (25 mg total) by mouth daily.   multivitamin with minerals Tabs tablet Take 1 tablet by mouth daily.   polyethylene glycol 17 g packet  Commonly known as: MIRALAX  / GLYCOLAX  Take 17 g by mouth daily as needed for mild constipation.   predniSONE  5 MG tablet Commonly known as: DELTASONE  Take 5-10 mg by mouth daily as needed (rheumatoid arthritis - pain).   tiZANidine  4 MG tablet Commonly known as: ZANAFLEX  Take 1 tablet (4 mg total) by mouth 2 (two) times daily as needed for muscle spasms. TAKE 1 TABLET(4 MG) BY MOUTH TWICE DAILY What changed:  when to take this additional instructions   Vitamin D  50 MCG (2000 UT) Caps Take 2,000 Units by mouth daily.        Contact information for after-discharge care     Destination     [facility] [place], [place] .   Service: Skilled Nursing Contact information: [address] [place]   [address] [phone]                    Allergies[1]  Consultations: General surgery  Procedures:   Discharge Exam: BP  122/86 (BP Location: Left Arm)   Pulse 77   Temp 99.8 F (37.7 C) (Oral)   Resp 16   Ht 5' 8 (1.727 m)   Wt 63.1 kg   LMP 06/25/2006   SpO2 97%   BMI 21.15 kg/m  Physical Exam Constitutional:      Comments: Frail elderly woman resting in bed in no distress.  Complete blindness noted  HENT:     Head: Normocephalic and atraumatic.     Mouth/Throat:     Mouth: Mucous membranes are moist.  Eyes:     Comments: Degenerated right eye.  Artificial left eye  Cardiovascular:     Rate and Rhythm: Normal rate.  Pulmonary:     Effort: Pulmonary effort is normal. No respiratory distress.     Breath sounds: Normal breath sounds. No wheezing.  Abdominal:     General: Bowel sounds are normal. There is no distension.     Palpations: Abdomen is soft.     Tenderness: There is no abdominal tenderness.     Comments: Ostomy noted in left quadrant with brown stool in bag  Musculoskeletal:     Cervical back: Normal range of motion and neck supple.     Comments: Severely contracted UE with malformed joints in both hands from severe RA  Skin:    General: Skin is warm and dry.  Neurological:     Mental Status: Mental status is at baseline.      The results of significant diagnostics from this hospitalization (including imaging, microbiology, ancillary and laboratory) are listed below for reference.   Microbiology: No results found for this or any previous visit (from the past 240 hours).   Labs: BNP (last 3 results) No results for input(s): BNP in the last 8760 hours. Basic Metabolic Panel: Recent Labs  Lab 07/03/24 0625 07/06/24 0541  NA 143 139  K 3.6 3.6  CL 109 105  CO2 26 26  GLUCOSE 88 90  BUN 16 14  CREATININE 0.54 0.55  CALCIUM  8.1* 8.5*  MG  --  1.8   Liver Function Tests: No results for input(s): AST, ALT, ALKPHOS, BILITOT, PROT, ALBUMIN in the last 168 hours. No results for input(s): LIPASE, AMYLASE in the last 168 hours. No results for input(s):  AMMONIA in the last 168 hours. CBC: Recent Labs  Lab 07/03/24 0625 07/06/24 0541  WBC 7.2 8.7  HGB 10.1* 10.5*  HCT 30.8* 31.9*  MCV 96.9 96.4  PLT 253 270   Cardiac  Enzymes: No results for input(s): CKTOTAL, CKMB, CKMBINDEX, TROPONINI in the last 168 hours. BNP: Invalid input(s): POCBNP CBG: No results for input(s): GLUCAP in the last 168 hours. D-Dimer No results for input(s): DDIMER in the last 72 hours. Hgb A1c No results for input(s): HGBA1C in the last 72 hours. Lipid Profile No results for input(s): CHOL, HDL, LDLCALC, TRIG, CHOLHDL, LDLDIRECT in the last 72 hours. Thyroid  function studies No results for input(s): TSH, T4TOTAL, T3FREE, THYROIDAB in the last 72 hours.  Invalid input(s): FREET3 Anemia work up No results for input(s): VITAMINB12, FOLATE, FERRITIN, TIBC, IRON, RETICCTPCT in the last 72 hours. Urinalysis    Component Value Date/Time   COLORURINE YELLOW 09/27/2022 0422   APPEARANCEUR CLEAR 09/27/2022 0422   LABSPEC >=1.030 09/27/2022 0422   PHURINE 5.0 09/27/2022 0422   GLUCOSEU NEGATIVE 09/27/2022 0422   HGBUR NEGATIVE 09/27/2022 0422   BILIRUBINUR neg 12/04/2022 1111   KETONESUR 20 (A) 09/27/2022 0422   PROTEINUR Positive (A) 12/04/2022 1111   PROTEINUR 30 (A) 09/27/2022 0422   UROBILINOGEN 0.2 12/04/2022 1111   UROBILINOGEN 0.2 11/22/2014 0929   NITRITE neg 12/04/2022 1111   NITRITE NEGATIVE 09/27/2022 0422   LEUKOCYTESUR Large (3+) (A) 12/04/2022 1111   LEUKOCYTESUR NEGATIVE 09/27/2022 0422   Sepsis Labs Recent Labs  Lab 07/03/24 0625 07/06/24 0541  WBC 7.2 8.7   Microbiology No results found for this or any previous visit (from the past 240 hours).  Procedures/Studies: DG Abd Portable 1V Result Date: 07/06/2024 EXAM: 1 VIEW XRAY OF THE ABDOMEN 07/06/2024 06:14:00 AM COMPARISON: 07/04/2024 CLINICAL HISTORY: Ileus (HCC) FINDINGS: BOWEL: Antegrade progression of enteric contrast  material is now seen within the decompressed descending colon. No dilated bowel loops identified. SOFT TISSUES: Aortic atherosclerosis. BONES: Severe levoscoliosis of lumbar spine with multilevel spondylitic change and degenerative changes in bilateral hips. No acute fracture. IMPRESSION: 1. Antegrade progression of enteric contrast material within the decompressed descending colon, consistent with resolving ileus. No dilated bowel loops identified. Electronically signed by: Waddell Calk MD MD 07/06/2024 06:38 AM EST RP Workstation: [id number]   DG Abd Portable 1V Result Date: 07/04/2024 CLINICAL DATA:  Small bowel obstruction. EXAM: PORTABLE ABDOMEN - 1 VIEW COMPARISON:  July 02, 2024 FINDINGS: The bowel gas pattern is normal. Radiopaque contrast is seen within the mid and distal ascending colon. The left lower quadrant ostomy site is noted. No radio-opaque calculi are seen. There is marked severity levoscoliosis of the lumbar spine with marked severity multilevel degenerative changes. IMPRESSION: Radiopaque contrast within the mid and distal ascending colon. Electronically Signed   By: Suzen Dials M.D.   On: 07/04/2024 10:43   DG Abd Portable 1V-Small Bowel Obstruction Protocol-initial, 8 hr delay Result Date: 07/02/2024 EXAM: 1 VIEW XRAY OF THE ABDOMEN 07/02/2024 10:09:00 PM COMPARISON: Comparison with 07/02/2024. CLINICAL HISTORY: FINDINGS: BOWEL: Examination is obtained 8 hours after administration of oral contrast material. Contrast material is demonstrated in the cecum suggesting no evidence of bowel obstruction. Nonobstructive bowel gas pattern. SOFT TISSUES: A left lower quadrant ostomy is present. No abnormal calcifications. BONES: Prominent thoracolumbar scoliosis convex towards the left. Prominent degenerative changes in the lumbar spine and hips. No acute fracture. LUNG BASES: Lung bases are clear. IMPRESSION: 1. No evidence of bowel obstruction. 2. Left lower quadrant ostomy.  Electronically signed by: Elsie Gravely MD 07/02/2024 10:12 PM EST RP Workstation: [id number]   DG Abd Portable 1V Result Date: 07/02/2024 CLINICAL DATA:  Small bowel obstruction EXAM: PORTABLE ABDOMEN - 1 VIEW COMPARISON:  06/30/2024 FINDINGS:  NG tube has been removed in the interval. Diffuse gas-filled small bowel loops and colon are seen in the abdomen and pelvis in a nonspecific pattern. Bowel gas pattern does not appear frankly obstructive although component of underlying ileus not excluded. Marked convex leftward scoliosis of the upper lumbar spine again noted. Degenerative changes evident in both hips, right greater than left. IMPRESSION: Nonspecific bowel gas pattern with diffuse gas-filled small bowel loops and colon. Component of underlying ileus not excluded. Electronically Signed   By: Camellia Candle M.D.   On: 07/02/2024 10:03   DG Abd Portable 1 View Result Date: 06/30/2024 EXAM: 1 VIEW XRAY OF THE ABDOMEN 06/30/2024 01:33:42 AM COMPARISON: 04/17/2023, CT today. CLINICAL HISTORY: NG tube placement verification. FINDINGS: LINES, TUBES AND DEVICES: NG tube tip is in the mid stomach. SOFT TISSUES: No abnormal calcifications. BONES: No acute fracture. IMPRESSION: 1. NG tube tip is in the mid stomach. Electronically signed by: Franky Crease MD 06/30/2024 01:39 AM EST RP Workstation: [id number]   CT ABDOMEN PELVIS W CONTRAST Result Date: 06/29/2024 EXAM: CT ABDOMEN AND PELVIS WITH CONTRAST 06/29/2024 09:34:41 PM TECHNIQUE: CT of the abdomen and pelvis was performed with the administration of 70 mL of iohexol  (OMNIPAQUE ) 350 MG/ML injection. Multiplanar reformatted images are provided for review. Automated exposure control, iterative reconstruction, and/or weight-based adjustment of the mA/kV was utilized to reduce the radiation dose to as low as reasonably achievable. COMPARISON: CT of the pelvis without contrast 06/16/2024 and CT abdomen without and with contrast 11/26/2022. CLINICAL HISTORY: Abdominal  pain, acute, nonlocalized. Severe nausea and vomiting and suspected bowel obstruction. 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 FINDINGS: LOWER CHEST: There is mild cardiomegaly. No pericardial effusion. Left main and 2-vessel coronary calcifications again noted in the LAD and circumflex arteries, greatest in the LAD. Similar to the CTA chest of 05/01/2014, there is homogeneous soft tissue in the prevascular mediastinal space measuring 3.1 x 1.6 cm on series 2, axial image 3. 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 There is a 4 mm chronic right middle lobe nodule laterally on series 4 image 42, benign given length of stability. Lung bases are otherwise clear. LIVER: The liver again demonstrates benign cysts in the left lobe, measuring 4.5 cm in segment 4b, 1.4 cm in segment 3. No liver mass is seen. GALLBLADDER AND BILE DUCTS: Gallbladder is unremarkable. No biliary ductal dilatation. SPLEEN: No acute abnormality. PANCREAS: There is mild chronic prominence of the proximal pancreatic duct, with otherwise unremarkable pancreas. ADRENAL GLANDS: No acute abnormality. KIDNEYS, URETERS AND BLADDER: Scarring changes are noted in both kidneys. There are post-treatment changes in the posterior medial interpolar left renal cortex. No recurrent mass is seen. No urinary stone or obstruction. Bilateral bladder diverticula appear similar. No bladder wall thickening is evident. GI AND BOWEL: There is mild fluid filling in the stomach. There is dilatation in the proximal to mid small bowel up to 4.3 cm. There is a short transitional segment on series 2 axial image 45, just anterior to where the left common iliac artery bifurcates in the central abdomen just left of the midline. Etiology is likely adhesive disease. A large ventral hernia at and below the umbilical level continues to be seen, today containing both dilated and decompressed segments  and a portion of the transverse colon. I could not strictly exclude a closed-loop obstruction within the hernia. There is no bowel pneumatosis. Small bowel in the right mid and lower  abdomen and right hemipelvis is completely collapsed. There is mild stool retention in the ascending colon. There is diffuse diverticulosis in the transverse and left colon without evidence of diverticulitis. PERITONEUM AND RETROPERITONEUM: No ascites. No free air. No free hemorrhage, localizing collections or portal venous gas. VASCULATURE: Aorta is normal in caliber. There is extensive aortoiliac calcific plaque. Branch vessel atherosclerosis is present but less heavy. LYMPH NODES: No lymphadenopathy. REPRODUCTIVE ORGANS: No acute abnormality. BONES AND SOFT TISSUES: Severe thoracolumbar levoscoliosis, apex L1 with advanced degenerative changes, multilevel vertebral body ankylosis. Bone graft harvest defects chronically noted in the posterior left ilium. 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 Osteopenia. Again noted is the recent intraarticular nondisplaced fracture of the posterior column of the right acetabulum and a chronic healed fracture deformity of the right inferior pubic ramus. No focal soft tissue abnormality. IMPRESSION: 1. Dilated proximal to mid small bowel up to 4.3 cm with a short transitional segment in the central abdomen, likely due to adhesive disease, without bowel pneumatosis. 2. Large ventral hernia at and below the umbilical level containing both dilated and decompressed bowel and a portion of the transverse colon, with closed-loop obstruction within the hernia sac not strictly excluded. 3. Post-treatment changes in the left kidney without recurrent mass, and no evidence of metastatic disease. 4. Recent nondisplaced fracture of the posterior column of the right acetabulum. 5. Osteopenia, severe scoliosis, and degenerative changes as above . SABRA  6. Coronary artery and aortic  atherosclerosis. Electronically signed by: Francis Quam MD 06/29/2024 10:20 PM EST RP Workstation: [id number]   CT PELVIS WO CONTRAST Result Date: 06/16/2024 EXAM: CT PELVIS, WITHOUT IV CONTRAST 06/16/2024 01:34:14 AM TECHNIQUE: Axial images were acquired through the pelvis without IV contrast. Reformatted images were reviewed. Automated exposure control, iterative reconstruction, and/or weight based adjustment of the mA/kV was utilized to reduce the radiation dose to as low as reasonably achievable. COMPARISON: CT dated 09/26/22. CLINICAL HISTORY: Hip trauma, fracture suspected, no prior imaging; right hip pain. FINDINGS: BONES: Unchanged appearance of the lucent lesion in the left iliac bone compared to CT dated 03/24. Remote right inferior pubic ramus fracture. Nondisplaced posterior/superior acetabular fracture extending into the hip joint. JOINTS: Osteoarthritis of both hips. Nondisplaced posterior acetabular fracture extending into the hip joint. No dislocation. SOFT TISSUES: The soft tissues are unremarkable. INTRAPELVIC CONTENTS: Limited images of the intrapelvic contents demonstrate no acute abnormality. IMPRESSION: 1. Nondisplaced posterior/superior acetabular fracture extending into the hip joint. Electronically signed by: Norman Gatlin MD 06/16/2024 01:40 AM EST RP Workstation: [id number]     Time coordinating discharge: Over 30 minutes    Alm Apo, MD  [facility] 07/08/2024, 11:03 AM    [1]  Allergies Allergen Reactions   Rosuvastatin  Diarrhea, Itching and Other (See Comments)    Severe myalgia    Spinach Itching, Rash and Other (See Comments)    Welts.   "

## 2024-07-08 NOTE — Progress Notes (Signed)
 [facility] called x 2 to give report, same unsuccessful, was told nurse would call back

## 2024-07-08 NOTE — Progress Notes (Addendum)
 Mobility Specialist Progress Note:    07/08/24 0851  Mobility  Activity Pivoted/transferred to/from [facility]  Level of Assistance Moderate assist, patient does 50-74% (+2)  Assistive Device Front wheel walker  Distance Ambulated (ft) 3 ft  RLE Weight Bearing Per Provider Order PWB  RLE Partial Weight Bearing Percentage or Pounds 50%  Activity Response Tolerated well  Mobility Referral Yes  Mobility visit 1 Mobility  Mobility Specialist Start Time (ACUTE ONLY) [id number]  Mobility Specialist Stop Time (ACUTE ONLY) 0902  Mobility Specialist Time Calculation (min) (ACUTE ONLY) 10 min   Received pt's call light, requesting assistance to [facility]. Pt required ModA +2 STS. Pt had a heavy posterior lean. Pt pivoted and sat on BSC. Instructed pt to use call light when finished. All needs met.   Lavanda Pollack Mobility Specialist  Please contact via [employer] or  Rehab Office [phone]

## 2024-07-08 NOTE — TOC Transition Note (Signed)
 Transition of Care [facility]) - Discharge Note   Patient Details  Name: [name] MRN: [id number] Date of Birth: 08-03-1955  [facility] CM/SW Contact:  Jeoffrey LITTIE Maranda ISRAEL Phone Number: 07/08/2024, 12:28 PM   Clinical Narrative:    Patient will DC to: [facility] Anticipated DC date: 07/08/24 Family notified: Yes Transport by: [facility]   Per MD patient ready for DC to [facility]. RN to call report prior to discharge [phone] room 124. RN, patient, patient's family, and facility notified of DC. Discharge Summary and FL2 sent to facility. DC packet on chart. Ambulance transport requested for patient.   CSW will sign off for now as social work intervention is no longer needed. Please consult us  again if new needs arise.       Barriers to Discharge: Barriers Resolved   Patient Goals and CMS Choice            Discharge Placement   Existing PASRR number confirmed : 07/08/24            Patient to be transferred to facility by: [facility] Name of family member notified: Malicia Patient and family notified of of transfer: 07/08/24  Discharge Plan and Services Additional resources added to the After Visit Summary for                                       Social Drivers of Health (SDOH) Interventions SDOH Screenings   Food Insecurity: No Food Insecurity (06/30/2024)  Housing: Low Risk (06/30/2024)  Transportation Needs: No Transportation Needs (06/30/2024)  Utilities: Not At Risk (06/30/2024)  Depression (PHQ2-9): Low Risk (10/01/2023)  Financial Resource Strain: Low Risk (10/16/2021)  Physical Activity: Inactive (10/16/2021)  Social Connections: Moderately Isolated (06/30/2024)  Stress: Stress Concern Present (10/16/2021)  Tobacco Use: Medium Risk (06/29/2024)     Readmission Risk Interventions    06/19/2024   12:56 PM  Readmission Risk Prevention Plan  Post Dischage Appt Complete  Medication Screening Complete  Transportation Screening Complete

## 2024-07-13 ENCOUNTER — Ambulatory Visit: Admitting: Gastroenterology

## 2024-07-20 ENCOUNTER — Inpatient Hospital Stay: Admitting: Family Medicine

## 2024-07-27 ENCOUNTER — Ambulatory Visit: Admitting: Gastroenterology

## 2024-07-28 ENCOUNTER — Ambulatory Visit: Admitting: Family Medicine

## 2024-07-28 ENCOUNTER — Inpatient Hospital Stay: Admitting: Family Medicine

## 2024-07-29 ENCOUNTER — Telehealth: Payer: Self-pay

## 2024-07-29 NOTE — Transitions of Care (Post Inpatient/ED Visit) (Unsigned)
" ° °  07/29/2024  Name: [name] MRN: [id number] DOB: 02-Sep-1955  Today's TOC FU Call Status: Today's TOC FU Call Status:: Unsuccessful Call (1st Attempt) Unsuccessful Call (1st Attempt) Date: 07/29/24  Attempted to reach the patient regarding the most recent Inpatient/ED visit.  Follow Up Plan: Additional outreach attempts will be made to reach the patient to complete the Transitions of Care (Post Inpatient/ED visit) call.   Signature Julian Lemmings, LPN [facility] Nurse Health Advisor Direct Dial [phone]  "

## 2024-07-30 ENCOUNTER — Telehealth: Payer: Self-pay | Admitting: *Deleted

## 2024-07-30 NOTE — Transitions of Care (Post Inpatient/ED Visit) (Signed)
 "  07/30/2024  Name: [name] MRN: [id number] DOB: Jan 31, 1956  Today's TOC FU Call Status: Today's TOC FU Call Status:: Successful TOC FU Call Completed Unsuccessful Call (1st Attempt) Date: 07/29/24 [facility] FU Call Complete Date: 07/30/24  Patient's Name and Date of Birth confirmed. Name, DOB  Transition Care Management Follow-up Telephone Call Date of Discharge: 07/28/24 Discharge Facility: Other ([facility]) Name of Other ([facility]) Discharge Facility: [facility] Type of Discharge: Inpatient Admission Primary Inpatient Discharge Diagnosis:: weakness How have you been since you were released from the hospital?: Better Any questions or concerns?: No  Items Reviewed: Did you receive and understand the discharge instructions provided?: Yes Medications obtained,verified, and reconciled?: Yes (Medications Reviewed) Any new allergies since your discharge?: No Dietary orders reviewed?: Yes Do you have support at home?: Yes People in Home [RPT]: child(ren), adult  Medications Reviewed Today: Medications Reviewed Today     Reviewed by Emmitt Pan, LPN (Licensed Practical Nurse) on 07/30/24 at 1552  Med List Status: <None>   Medication Order Taking? Sig Documenting Provider Last Dose Status Informant  Abatacept  125 MG/ML SOSY [id number] Yes Inject 125 mg into the skin once a week. Thursday [provider]  Active Pharmacy Records           Med Note [facility], [facility]   Tue Aug 07, 2016  8:11 PM)    albuterol  (VENTOLIN  HFA) 108 [phone] Base) MCG/ACT inhaler [id number] Yes INHALE 1 PUFF INTO THE LUNGS EVERY 4 HOURS AS NEEDED FOR WHEEZING OR SHORTNESS OF BREATH [name] LABOR, MD  Active Pharmacy Records  allopurinol  (ZYLOPRIM ) 100 MG tablet [id number] Yes TAKE 1 TABLET BY MOUTH EVERY DAY [name] LABOR, MD  Active Pharmacy Records  aspirin  EC 81 MG tablet [id number] Yes Take 1 tablet (81 mg total) by mouth daily. Gabriel Noa, MD  Active Pharmacy Records  CALCIUM  PO  [id number] Yes Take 1 tablet by mouth 2 (two) times daily. 600 mg of calcium  with vitamin D  [provider]  Active Pharmacy Records  cetirizine  (ZYRTEC ) 10 MG tablet [id number] Yes Take 1 tablet (10 mg total) by mouth daily. [name] LABOR, MD  Active Pharmacy Records  Cholecalciferol  (VITAMIN D ) 2000 units CAPS [id number] Yes Take 2,000 Units by mouth daily. [provider]  Active Pharmacy Records  diclofenac sodium (VOLTAREN) 1 % GEL [id number]  Apply 1 application topically 4 (four) times daily as needed (arthritis).  Patient not taking: Reported on 07/30/2024   [provider]  Active Pharmacy Records  docusate sodium  (COLACE) 100 MG capsule [id number] Yes Take 100 mg by mouth 2 (two) times daily. [provider]  Active Pharmacy Records  esomeprazole  (NEXIUM ) 40 MG capsule [id number] Yes TAKE 1 CAPSULE BY MOUTH EVERY DAY AT CURL[facility] [name] LABOR, MD  Active Pharmacy Records  folic acid  (FOLVITE ) 400 MCG tablet [id number] Yes Take 400 mcg by mouth daily. [provider]  Active Pharmacy Records  HYDROcodone -acetaminophen  [facility]) 10-325 MG tablet [id number] Yes Take 1 tablet by mouth every 6 (six) hours as needed for moderate pain (pain score 4-6) or severe pain (pain score 7-10). Patsy Lenis, MD  Active   hydrocortisone  2.5 % cream [id number]  Apply 1 Application topically 2 (two) times daily as needed.  Patient not taking: Reported on 07/30/2024   [provider]  Active Pharmacy Records  ketoconazole  (NIZORAL ) 2 % shampoo [id number] Yes Apply 1 application topically 2 (two) times a week. [provider]  Active Pharmacy Records  Methotrexate  Sodium (METHOTREXATE , PF,)  50 MG/2ML injection [id number] Yes Inject 0.5 mLs into the muscle once a week. Thursday [provider]  Active Pharmacy Records           Med Note [place], [place] S   Fri Jun 29, 2016  5:35 PM)    metoprolol  succinate (TOPROL -XL) 25 MG 24 hr tablet [id number] Yes Take 1  tablet (25 mg total) by mouth daily. [name] LABOR, MD  Active Pharmacy Records  Multiple Vitamin (MULTIVITAMIN WITH MINERALS) TABS tablet [id number] Yes Take 1 tablet by mouth daily. [provider]  Active Pharmacy Records  polyethylene glycol (MIRALAX  / GLYCOLAX ) 17 g packet [id number] Yes Take 17 g by mouth daily as needed for mild constipation. [name] LABOR, MD  Active Pharmacy Records  predniSONE  (DELTASONE ) 5 MG tablet [id number] Yes Take 5-10 mg by mouth daily as needed (rheumatoid arthritis - pain). [provider]  Active Pharmacy Records  tiZANidine  (ZANAFLEX ) 4 MG tablet [id number]  Take 1 tablet (4 mg total) by mouth 2 (two) times daily as needed for muscle spasms. TAKE 1 TABLET(4 MG) BY MOUTH TWICE DAILY  Patient not taking: Reported on 07/30/2024   Caleen Burgess BROCKS, MD  Active Pharmacy Records  vitamin B-12 (CYANOCOBALAMIN ) 500 MCG tablet [id number]  Take 500 mcg by mouth daily.  Patient not taking: Reported on 07/30/2024   [provider]  Active Pharmacy Records  vitamin C  (ASCORBIC ACID ) 500 MG tablet [id number] Yes Take 500 mg by mouth daily.  [provider]  Active Pharmacy Records  Med List Note [place], [place], CPhT 06/17/24 9080): Patient is legally blind and knows which medication based on how they feel.             Home Care and Equipment/Supplies: Were Home Health Services Ordered?: Yes Name of Home Health Agency:: [facility] Has Agency set up a time to come to your home?: Yes First Home Health Visit Date: 07/30/24 Any new equipment or medical supplies ordered?: NA  Functional Questionnaire: Do you need assistance with bathing/showering or dressing?: Yes Do you need assistance with meal preparation?: Yes Do you need assistance with eating?: No Do you have difficulty maintaining continence: No Do you need assistance with getting out of bed/getting out of a chair/moving?: No Do you have difficulty managing or taking your medications?:  Yes  Follow up appointments reviewed: PCP Follow-up appointment confirmed?: Yes Date of PCP follow-up appointment?: 07/31/24 Follow-up Provider: [facility] Follow-up appointment confirmed?: NA Do you need transportation to your follow-up appointment?: No Do you understand care options if your condition(s) worsen?: Yes-patient verbalized understanding    SIGNATURE Julian Lemmings, LPN [facility] Nurse Health Advisor Direct Dial [phone]  "

## 2024-07-30 NOTE — Telephone Encounter (Unsigned)
 Copied from CRM [id number]. Topic: Clinical - Home Health Verbal Orders >> Jul 30, 2024  1:28 PM Alfonso HERO wrote: Caller/Agency: [facility] Callback Number: [phone] Service Requested: Physical Therapy Frequency: 1 w 1 , 2 w 4, 1 w 2 Any new concerns about the patient? No

## 2024-07-31 ENCOUNTER — Ambulatory Visit: Admitting: Family Medicine

## 2024-07-31 ENCOUNTER — Other Ambulatory Visit: Payer: Self-pay | Admitting: Family Medicine

## 2024-07-31 NOTE — Telephone Encounter (Signed)
 Please okay these orders  ?

## 2024-07-31 NOTE — Telephone Encounter (Signed)
 FYI Spoke with [facility] advised of approval for VO orders for Physical Therapy as requested

## 2024-08-03 ENCOUNTER — Ambulatory Visit: Admitting: Family Medicine

## 2024-08-06 ENCOUNTER — Inpatient Hospital Stay: Admitting: Family Medicine

## 2024-08-14 ENCOUNTER — Ambulatory Visit: Admitting: Gastroenterology
